# Patient Record
Sex: Male | Born: 1950 | Race: Black or African American | Hispanic: No | Marital: Married | Smoking: Never smoker
Health system: Southern US, Community
[De-identification: ages and names within clinical notes are randomized; demographics above are authoritative.]

## PROBLEM LIST (undated history)

## (undated) DIAGNOSIS — E119 Type 2 diabetes mellitus without complications: Secondary | ICD-10-CM

## (undated) DIAGNOSIS — I639 Cerebral infarction, unspecified: Secondary | ICD-10-CM

## (undated) DIAGNOSIS — R1310 Dysphagia, unspecified: Secondary | ICD-10-CM

## (undated) DIAGNOSIS — R0902 Hypoxemia: Secondary | ICD-10-CM

## (undated) DIAGNOSIS — I63219 Cerebral infarction due to unspecified occlusion or stenosis of unspecified vertebral arteries: Secondary | ICD-10-CM

## (undated) DIAGNOSIS — I10 Essential (primary) hypertension: Secondary | ICD-10-CM

## (undated) HISTORY — DX: Cerebral infarction due to unspecified occlusion or stenosis of unspecified vertebral artery: I63.219

## (undated) HISTORY — DX: Dysphagia, unspecified: R13.10

## (undated) HISTORY — DX: Hypoxemia: R09.02

---

## 1999-06-30 ENCOUNTER — Emergency Department (HOSPITAL_COMMUNITY): Admission: EM | Admit: 1999-06-30 | Discharge: 1999-06-30 | Payer: Self-pay | Admitting: *Deleted

## 2008-11-06 ENCOUNTER — Encounter: Admission: RE | Admit: 2008-11-06 | Discharge: 2008-11-06 | Payer: Self-pay | Admitting: Specialist

## 2009-07-15 ENCOUNTER — Emergency Department (HOSPITAL_COMMUNITY): Admission: EM | Admit: 2009-07-15 | Discharge: 2009-07-15 | Payer: Self-pay | Admitting: Family Medicine

## 2009-11-17 ENCOUNTER — Encounter: Admission: RE | Admit: 2009-11-17 | Discharge: 2009-12-09 | Payer: Self-pay | Admitting: Orthopaedic Surgery

## 2010-07-01 ENCOUNTER — Encounter: Admission: RE | Admit: 2010-07-01 | Discharge: 2010-07-01 | Payer: Self-pay | Admitting: Specialist

## 2013-09-05 ENCOUNTER — Encounter (HOSPITAL_COMMUNITY): Payer: Self-pay | Admitting: Emergency Medicine

## 2013-09-05 ENCOUNTER — Emergency Department (INDEPENDENT_AMBULATORY_CARE_PROVIDER_SITE_OTHER)
Admission: EM | Admit: 2013-09-05 | Discharge: 2013-09-05 | Disposition: A | Discharge status: Home / Self Care | Payer: Self-pay | Source: Home / Self Care | Attending: Family Medicine | Admitting: Family Medicine

## 2013-09-05 DIAGNOSIS — R42 Dizziness and giddiness: Secondary | ICD-10-CM

## 2013-09-05 LAB — GLUCOSE, CAPILLARY: Glucose-Capillary: 195 mg/dL — ABNORMAL HIGH (ref 70–99)

## 2013-09-05 NOTE — ED Provider Notes (Signed)
CSN: [id number]     Arrival date & time 09/05/13  1218 History   First MD Initiated Contact with Patient 09/05/13 1409     Chief Complaint  Patient presents with  . Dizziness   (Consider location/radiation/quality/duration/timing/severity/associated sxs/prior Treatment) HPI Comments: 63 year old male presents complaining of feeling like he does not have normal coordination for the past 3 days. He describes this as a strange sensation of dizziness. This occurs only when he is walking, never with standing, sitting, or changes in position. He feels like he cannot walk straight and he is bumping into either wall. He denies any and all other symptoms. He does report a history of stroke many years ago with no residual deficits.  Patient is a 63 y.o. male presenting with dizziness.  Dizziness Associated symptoms: no chest pain, no diarrhea, no headaches, no nausea, no palpitations, no shortness of breath and no vomiting     History reviewed. No pertinent past medical history. History reviewed. No pertinent past surgical history. History reviewed. No pertinent family history. History  Substance Use Topics  . Smoking status: Never Smoker   . Smokeless tobacco: Not on file  . Alcohol Use: No    Review of Systems  Constitutional: Negative for fever, chills, diaphoresis, activity change, appetite change, fatigue and unexpected weight change.  HENT: Negative for congestion, ear pain, postnasal drip, rhinorrhea, sinus pressure, sore throat and voice change.   Eyes: Negative for visual disturbance.  Respiratory: Negative for cough, chest tightness and shortness of breath.   Cardiovascular: Negative for chest pain, palpitations and leg swelling.  Gastrointestinal: Negative for nausea, vomiting, abdominal pain, diarrhea and constipation.  Genitourinary: Negative for dysuria, urgency, frequency and hematuria.  Musculoskeletal: Negative for arthralgias, myalgias, neck pain and neck stiffness.  Skin:  Negative for rash.  Neurological: Positive for dizziness ( Lack of coordination ). Negative for tremors, seizures, syncope, facial asymmetry, speech difficulty, weakness, light-headedness, numbness and headaches.    Allergies  Review of patient's allergies indicates no known allergies.  Home Medications  No current outpatient prescriptions on file. BP 146/91  Pulse 69  Temp(Src) 98 F (36.7 C) (Oral)  Resp 16  SpO2 100% Physical Exam  Nursing note and vitals reviewed. Constitutional: He is oriented to person, place, and time. Vital signs are normal. He appears well-developed and well-nourished. No distress.  HENT:  Head: Normocephalic.  Pulmonary/Chest: Effort normal. No respiratory distress.  Neurological: He is alert and oriented to person, place, and time. He has normal strength and normal reflexes. He displays no tremor. No cranial nerve deficit or sensory deficit. He exhibits normal muscle tone. He displays a negative Romberg sign. Coordination and gait normal.  Skin: Skin is warm and dry. No rash noted. He is not diaphoretic.  Psychiatric: He has a normal mood and affect. Judgment normal.    ED Course  Procedures (including critical care time) Labs Review Labs Reviewed  GLUCOSE, CAPILLARY - Abnormal; Notable for the following:    Glucose-Capillary 195 (*)    All other components within normal limits   Imaging Review No results found.  EKG Interpretation    Date/Time:    Ventricular Rate:    PR Interval:    QRS Duration:   QT Interval:    QTC Calculation:   R Axis:     Text Interpretation:              MDM   1. Dizziness    Case discussed with attending Dr. Juventino Slovak.  This patient's physical  exam is entirely normal with normal vital signs. He has no additional complaints to suggest an underlying problem. Although he states that he cannot walk straight, he is observed walking completely normally and he reports that his symptoms have resolved. He'll  followup with primary care. We'll start a baby aspirin once daily.       Liam Graham, PA-C 09/05/13 1502

## 2013-09-05 NOTE — Discharge Instructions (Signed)
Start taking one 81 mg baby aspirin daily. You need to set up a primary care physician to followup on this problem if it returns.  You also need routine health maintenance to minimize risk factors for another stroke or heart disease. Please call the [facility] as soon as possible to set up a followup appointment.   Dizziness Dizziness is a common problem. It is a feeling of unsteadiness or lightheadedness. You may feel like you are about to faint. Dizziness can lead to injury if you stumble or fall. A person of any age group can suffer from dizziness, but dizziness is more common in older adults. CAUSES  Dizziness can be caused by many different things, including:  Middle ear problems.  Standing for too long.  Infections.  An allergic reaction.  Aging.  An emotional response to something, such as the sight of blood.  Side effects of medicines.  Fatigue.  Problems with circulation or blood pressure.  Excess use of alcohol, medicines, or illegal drug use.  Breathing too fast (hyperventilation).  An arrhythmia or problems with your heart rhythm.  Low red blood cell count (anemia).  Pregnancy.  Vomiting, diarrhea, fever, or other illnesses that cause dehydration.  Diseases or conditions such as Parkinson's disease, high blood pressure (hypertension), diabetes, and thyroid problems.  Exposure to extreme heat. DIAGNOSIS  To find the cause of your dizziness, your caregiver may do a physical exam, lab tests, radiologic imaging scans, or an electrocardiography test (ECG).  TREATMENT  Treatment of dizziness depends on the cause of your symptoms and can vary greatly. HOME CARE INSTRUCTIONS   Drink enough fluids to keep your urine clear or pale yellow. This is especially important in very hot weather. In the elderly, it is also important in cold weather.  If your dizziness is caused by medicines, take them exactly as directed. When taking blood pressure  medicines, it is especially important to get up slowly.  Rise slowly from chairs and steady yourself until you feel okay.  In the morning, first sit up on the side of the bed. When this seems okay, stand slowly while holding onto something until you know your balance is fine.  If you need to stand in one place for a long time, be sure to move your legs often. Tighten and relax the muscles in your legs while standing.  If dizziness continues to be a problem, have someone stay with you for a day or two. Do this until you feel you are well enough to stay alone. Have the person call your caregiver if he or she notices changes in you that are concerning.  Do not drive or use heavy machinery if you feel dizzy.  Do not drink alcohol. SEEK IMMEDIATE MEDICAL CARE IF:   Your dizziness or lightheadedness gets worse.  You feel nauseous or vomit.  You develop problems with talking, walking, weakness, or using your arms, hands, or legs.  You are not thinking clearly or you have difficulty forming sentences. It may take a friend or family member to determine if your thinking is normal.  You develop chest pain, abdominal pain, shortness of breath, or sweating.  Your vision changes.  You notice any bleeding.  You have side effects from medicine that seems to be getting worse rather than better. MAKE SURE YOU:   Understand these instructions.  Will watch your condition.  Will get help right away if you are not doing well or get worse. Document Released: 02/14/2001  Document Revised: 11/13/2011 Document Reviewed: 03/10/2011 [facility] Patient Information 2014 [place].

## 2013-09-05 NOTE — ED Notes (Signed)
Reports feeling off balance with walking for the past 3 days.  Pt denies any other symptoms.  States appetite has been normal, no new meds, etc.  Pt states that he was told that he was borderline DM and hypertensive.   Currently taking no prescription meds.

## 2013-09-05 NOTE — ED Provider Notes (Signed)
Medical screening examination/treatment/procedure(s) were performed by resident physician or non-physician practitioner and as supervising physician I was immediately available for consultation/collaboration.   Pauline Good MD.   Billy Fischer, MD 09/05/13 [phone]

## 2013-09-08 ENCOUNTER — Ambulatory Visit: Payer: Self-pay | Attending: Internal Medicine | Admitting: Internal Medicine

## 2013-09-08 VITALS — BP 131/86 | HR 77 | Temp 98.9°F | Resp 14 | Ht 71.0 in | Wt 206.4 lb

## 2013-09-08 DIAGNOSIS — R42 Dizziness and giddiness: Secondary | ICD-10-CM | POA: Insufficient documentation

## 2013-09-08 LAB — CBC WITH DIFFERENTIAL/PLATELET
Basophils Absolute: 0 10*3/uL (ref 0.0–0.1)
Basophils Relative: 0 % (ref 0–1)
EOS PCT: 1 % (ref 0–5)
Eosinophils Absolute: 0.1 10*3/uL (ref 0.0–0.7)
HEMATOCRIT: 42.2 % (ref 39.0–52.0)
HEMOGLOBIN: 14.2 g/dL (ref 13.0–17.0)
LYMPHS ABS: 2 10*3/uL (ref 0.7–4.0)
LYMPHS PCT: 45 % (ref 12–46)
MCH: 30.3 pg (ref 26.0–34.0)
MCHC: 33.6 g/dL (ref 30.0–36.0)
MCV: 90.2 fL (ref 78.0–100.0)
MONO ABS: 0.4 10*3/uL (ref 0.1–1.0)
MONOS PCT: 9 % (ref 3–12)
Neutro Abs: 2 10*3/uL (ref 1.7–7.7)
Neutrophils Relative %: 45 % (ref 43–77)
PLATELETS: 206 10*3/uL (ref 150–400)
RBC: 4.68 MIL/uL (ref 4.22–5.81)
RDW: 12.9 % (ref 11.5–15.5)
WBC: 4.6 10*3/uL (ref 4.0–10.5)

## 2013-09-08 LAB — POCT GLYCOSYLATED HEMOGLOBIN (HGB A1C): HEMOGLOBIN A1C: 8.3

## 2013-09-08 MED ORDER — MECLIZINE HCL 25 MG PO TABS: 25.0000 mg | ORAL_TABLET | Freq: Three times a day (TID) | ORAL | Status: DC | PRN

## 2013-09-08 MED ORDER — ASPIRIN EC 81 MG PO TBEC: 81.0000 mg | DELAYED_RELEASE_TABLET | Freq: Every day | ORAL | Status: DC

## 2013-09-08 NOTE — Progress Notes (Signed)
Pt is here to establish care. Requests an A1c to know if he's a diabetic. Complains of unbalance while walking, no pain, staggered walking, unbalanced feet. Also complains of tightness and pain in Lt chest; sometimes tightness in Lt hand that comes and goes. Has stroke history.

## 2013-09-08 NOTE — Progress Notes (Signed)
Patient ID: Joe Perry, male   DOB: 1951-08-06, 63 y.o.   MRN: [id number]   CC:  HPI: 63 year old male here to establish care. He states that he had a mini stroke in 2006. He was admitted in the hospital in [place] for this. He also woke up with symptoms of vertigo on 09/05/13, went to the ED. He had no neurologic deficits and was discharged from the ER and aspirin No imaging studies were done  He also complains of occasional chest tightness which can be precipitated by rest as well as activity. He has no known history of heart disease. No history of blood pressure high cholesterol or nicotine dependence.  No family history according to the patient  Social history nonsmoker nonalcoholic, works as a [employer]   No Known Allergies History reviewed. No pertinent past medical history. No current outpatient prescriptions on file prior to visit.   No current facility-administered medications on file prior to visit.   History reviewed. No pertinent family history. History   Social History  . Marital Status: Married    Spouse Name: N/A    Number of Children: N/A  . Years of Education: N/A   Occupational History  . Not on file.   Social History Main Topics  . Smoking status: Never Smoker   . Smokeless tobacco: Not on file  . Alcohol Use: No  . Drug Use: No  . Sexual Activity: No   Other Topics Concern  . Not on file   Social History Narrative  . No narrative on file    Review of Systems  Constitutional: Negative for fever, chills, diaphoresis, activity change, appetite change and fatigue.  HENT: Negative for ear pain, nosebleeds, congestion, facial swelling, rhinorrhea, neck pain, neck stiffness and ear discharge.   Eyes: Negative for pain, discharge, redness, itching and visual disturbance.  Respiratory: Negative for cough, choking, chest tightness, shortness of breath, wheezing and stridor.   Cardiovascular: Negative for chest pain, palpitations and leg  swelling.  Gastrointestinal: Negative for abdominal distention.  Genitourinary: Negative for dysuria, urgency, frequency, hematuria, flank pain, decreased urine volume, difficulty urinating and dyspareunia.  Musculoskeletal: Negative for back pain, joint swelling, arthralgias and gait problem.  Neurological: Negative for dizziness, tremors, seizures, syncope, facial asymmetry, speech difficulty, weakness, light-headedness, numbness and headaches.  Hematological: Negative for adenopathy. Does not bruise/bleed easily.  Psychiatric/Behavioral: Negative for hallucinations, behavioral problems, confusion, dysphoric mood, decreased concentration and agitation.    Objective:   Filed Vitals:   09/08/13 1727  BP: 131/86  Pulse: 77  Temp: 98.9 F (37.2 C)  Resp: 14    Physical Exam  Constitutional: Appears well-developed and well-nourished. No distress.  HENT: Normocephalic. External right and left ear normal. Oropharynx is clear and moist.  Eyes: Conjunctivae and EOM are normal. PERRLA, no scleral icterus.  Neck: Normal ROM. Neck supple. No JVD. No tracheal deviation. No thyromegaly.  CVS: RRR, S1/S2 +, no murmurs, no gallops, no carotid bruit.  Pulmonary: Effort and breath sounds normal, no stridor, rhonchi, wheezes, rales.  Abdominal: Soft. BS +,  no distension, tenderness, rebound or guarding.  Musculoskeletal: Normal range of motion. No edema and no tenderness.  Lymphadenopathy: No lymphadenopathy noted, cervical, inguinal. Neuro: Alert. Normal reflexes, muscle tone coordination. No cranial nerve deficit. Skin: Skin is warm and dry. No rash noted. Not diaphoretic. No erythema. No pallor.  Psychiatric: Normal mood and affect. Behavior, judgment, thought content normal.   No results found for this basename: WBC, HGB, HCT, MCV, PLT  No results found for this basename: CREATININE, BUN, NA, K, CL, CO2    No results found for this basename: HGBA1C   Lipid Panel  No results found for  this basename: chol, trig, hdl, cholhdl, vldl, ldlcalc       Assessment and plan:   There are no active problems to display for this patient.  Vertigo We'll order MRI of the brain because of history of TIA in the past Patient to continue with baby aspirin MRI of the C-spine because of suspected bony fragments on x-ray done in 11/10 Meclizine 25 every 8 as needed Follow up in 3 days  Establish care Baseline labs Colonoscopy due in 2017 Flu vaccine today          The patient was given clear instructions to go to ER or return to medical center if symptoms don't improve, worsen or new problems develop. The patient verbalized understanding. The patient was told to call to get any lab results if not heard anything in the next week.

## 2013-09-09 ENCOUNTER — Telehealth: Payer: Self-pay | Admitting: *Deleted

## 2013-09-09 LAB — COMPLETE METABOLIC PANEL WITH GFR
ALT: 16 U/L (ref 0–53)
AST: 14 U/L (ref 0–37)
Albumin: 4.1 g/dL (ref 3.5–5.2)
Alkaline Phosphatase: 46 U/L (ref 39–117)
BUN: 14 mg/dL (ref 6–23)
CALCIUM: 9.3 mg/dL (ref 8.4–10.5)
CHLORIDE: 101 meq/L (ref 96–112)
CO2: 27 meq/L (ref 19–32)
CREATININE: 0.96 mg/dL (ref 0.50–1.35)
GFR, EST NON AFRICAN AMERICAN: 86 mL/min
GLUCOSE: 253 mg/dL — AB (ref 70–99)
Potassium: 4.3 mEq/L (ref 3.5–5.3)
Sodium: 137 mEq/L (ref 135–145)
TOTAL PROTEIN: 6.5 g/dL (ref 6.0–8.3)
Total Bilirubin: 0.5 mg/dL (ref 0.3–1.2)

## 2013-09-09 LAB — LIPID PANEL
CHOL/HDL RATIO: 4.7 ratio
Cholesterol: 208 mg/dL — ABNORMAL HIGH (ref 0–200)
HDL: 44 mg/dL (ref >39)
LDL Cholesterol: 113 mg/dL — ABNORMAL HIGH (ref 0–99)
Triglycerides: 254 mg/dL — ABNORMAL HIGH (ref <150)
VLDL: 51 mg/dL — AB (ref 0–40)

## 2013-09-09 LAB — TSH: TSH: 0.647 u[IU]/mL (ref 0.350–4.500)

## 2013-09-09 LAB — TROPONIN I: TROPONIN I: 0.03 ng/mL (ref <0.06)

## 2013-09-09 LAB — VITAMIN D 25 HYDROXY (VIT D DEFICIENCY, FRACTURES): VIT D 25 HYDROXY: 15 ng/mL — AB (ref 30–89)

## 2013-09-09 NOTE — Telephone Encounter (Signed)
Contacted pt to inform him of his scheduling for MRI na d Echocardiogram. Left a voicemail for pt to give [place] a call back.

## 2013-09-10 ENCOUNTER — Other Ambulatory Visit: Payer: Self-pay | Admitting: Internal Medicine

## 2013-09-10 MED ORDER — METFORMIN HCL 1000 MG PO TABS: 1000.0000 mg | ORAL_TABLET | Freq: Two times a day (BID) | ORAL | Status: DC

## 2013-09-10 MED ORDER — ERGOCALCIFEROL 1.25 MG (50000 UT) PO CAPS: 50000.0000 [IU] | ORAL_CAPSULE | ORAL | Status: DC

## 2013-09-11 ENCOUNTER — Telehealth: Payer: Self-pay | Admitting: *Deleted

## 2013-09-11 NOTE — Telephone Encounter (Signed)
Line was busy when tried to contact pt about his lab results.

## 2013-09-11 NOTE — Telephone Encounter (Signed)
Message copied by Aking Klabunde, [place] R on Thu Sep 11, 2013  5:36 PM ------      Message from: Allyson Sabal MD, [facility]      Created: Wed Sep 10, 2013  3:30 PM       Please notify patient when the patient is a diabetic, hemoglobin A1c of 8.3, have called in a prescription for metformin, [facility]      Vitamin D deficient, have called in a prescription for vitamin D to the clinic ------

## 2013-09-11 NOTE — Telephone Encounter (Signed)
Message copied by Schon Zeiders, [place] R on Thu Sep 11, 2013  2:36 PM ------      Message from: Allyson Sabal MD, [facility]      Created: Wed Sep 10, 2013  3:30 PM       Please notify patient when the patient is a diabetic, hemoglobin A1c of 8.3, have called in a prescription for metformin, in the [facility]      Vitamin D deficient, have called in a prescription for vitamin D to the clinic ------

## 2013-09-12 ENCOUNTER — Ambulatory Visit (HOSPITAL_COMMUNITY)
Admission: RE | Admit: 2013-09-12 | Discharge: 2013-09-12 | Disposition: A | Discharge status: Home / Self Care | Payer: Self-pay | Source: Ambulatory Visit | Attending: Internal Medicine | Admitting: Internal Medicine

## 2013-09-12 DIAGNOSIS — I517 Cardiomegaly: Secondary | ICD-10-CM

## 2013-09-12 DIAGNOSIS — R42 Dizziness and giddiness: Secondary | ICD-10-CM | POA: Insufficient documentation

## 2013-09-12 NOTE — Progress Notes (Signed)
  Echocardiogram 2D Echocardiogram has been performed.  Joe Perry Joe Perry 09/12/2013, 3:30 PM

## 2013-09-22 MED ORDER — GEMFIBROZIL 600 MG PO TABS: 600.0000 mg | ORAL_TABLET | Freq: Two times a day (BID) | ORAL | Status: DC

## 2013-09-23 ENCOUNTER — Ambulatory Visit (HOSPITAL_COMMUNITY): Admission: RE | Admit: 2013-09-23 | Payer: Self-pay | Source: Ambulatory Visit

## 2013-09-23 ENCOUNTER — Telehealth: Payer: Self-pay | Admitting: *Deleted

## 2013-09-23 ENCOUNTER — Ambulatory Visit (HOSPITAL_COMMUNITY): Payer: Self-pay | Attending: Internal Medicine

## 2013-09-23 NOTE — Telephone Encounter (Signed)
Message copied by Andrea Ferrer, [place] R on Tue Sep 23, 2013 12:59 PM ------      Message from: Allyson Sabal MD, [facility]      Created: Mon Sep 22, 2013  2:58 PM       Notify patient of the patient's triglycerides and LDL elevated called in a prescription for lopid to [facility]. The vitamin D level is also marginally low. The patient should start taking vitamin D 2000 international units over-the-counter twice a day ------

## 2013-09-23 NOTE — Telephone Encounter (Signed)
Left a voicemail for pt to give us a call back. 

## 2013-09-23 NOTE — Telephone Encounter (Signed)
Message copied by Ceana Fiala, [place] R on Tue Sep 23, 2013  1:08 PM ------      Message from: Allyson Sabal MD, [facility]      Created: Mon Sep 22, 2013 10:55 AM       Notify patient of the 2-D echo is negative.       ------

## 2016-01-30 ENCOUNTER — Encounter (HOSPITAL_COMMUNITY): Payer: Self-pay

## 2016-01-30 ENCOUNTER — Emergency Department (HOSPITAL_COMMUNITY): Payer: Self-pay

## 2016-01-30 DIAGNOSIS — R42 Dizziness and giddiness: Principal | ICD-10-CM | POA: Insufficient documentation

## 2016-01-30 DIAGNOSIS — E119 Type 2 diabetes mellitus without complications: Secondary | ICD-10-CM | POA: Insufficient documentation

## 2016-01-30 DIAGNOSIS — R51 Headache: Secondary | ICD-10-CM | POA: Insufficient documentation

## 2016-01-30 DIAGNOSIS — R29810 Facial weakness: Secondary | ICD-10-CM | POA: Insufficient documentation

## 2016-01-30 DIAGNOSIS — I10 Essential (primary) hypertension: Secondary | ICD-10-CM | POA: Insufficient documentation

## 2016-01-30 DIAGNOSIS — Z7982 Long term (current) use of aspirin: Secondary | ICD-10-CM | POA: Insufficient documentation

## 2016-01-30 DIAGNOSIS — I7389 Other specified peripheral vascular diseases: Secondary | ICD-10-CM | POA: Insufficient documentation

## 2016-01-30 DIAGNOSIS — Z794 Long term (current) use of insulin: Secondary | ICD-10-CM | POA: Insufficient documentation

## 2016-01-30 DIAGNOSIS — R2689 Other abnormalities of gait and mobility: Secondary | ICD-10-CM | POA: Insufficient documentation

## 2016-01-30 DIAGNOSIS — Z7984 Long term (current) use of oral hypoglycemic drugs: Secondary | ICD-10-CM | POA: Insufficient documentation

## 2016-01-30 DIAGNOSIS — R252 Cramp and spasm: Secondary | ICD-10-CM | POA: Insufficient documentation

## 2016-01-30 DIAGNOSIS — Z7902 Long term (current) use of antithrombotics/antiplatelets: Secondary | ICD-10-CM | POA: Insufficient documentation

## 2016-01-30 DIAGNOSIS — I69398 Other sequelae of cerebral infarction: Secondary | ICD-10-CM | POA: Insufficient documentation

## 2016-01-30 LAB — I-STAT CHEM 8, ED
BUN: 23 mg/dL — ABNORMAL HIGH (ref 6–20)
CALCIUM ION: 1.15 mmol/L (ref 1.13–1.30)
CHLORIDE: 99 mmol/L — AB (ref 101–111)
Creatinine, Ser: 0.9 mg/dL (ref 0.61–1.24)
Glucose, Bld: 236 mg/dL — ABNORMAL HIGH (ref 65–99)
HCT: 47 % (ref 39.0–52.0)
Hemoglobin: 16 g/dL (ref 13.0–17.0)
Potassium: 3.7 mmol/L (ref 3.5–5.1)
SODIUM: 143 mmol/L (ref 135–145)
TCO2: 30 mmol/L (ref 0–100)

## 2016-01-30 LAB — CBG MONITORING, ED: Glucose-Capillary: 214 mg/dL — ABNORMAL HIGH (ref 65–99)

## 2016-01-30 LAB — DIFFERENTIAL
BASOS PCT: 0 %
Basophils Absolute: 0 10*3/uL (ref 0.0–0.1)
EOS PCT: 0 %
Eosinophils Absolute: 0 10*3/uL (ref 0.0–0.7)
LYMPHS PCT: 17 %
Lymphs Abs: 1.5 10*3/uL (ref 0.7–4.0)
MONO ABS: 0.8 10*3/uL (ref 0.1–1.0)
MONOS PCT: 9 %
Neutro Abs: 6.6 10*3/uL (ref 1.7–7.7)
Neutrophils Relative %: 74 %

## 2016-01-30 LAB — CBC
HEMATOCRIT: 44.8 % (ref 39.0–52.0)
Hemoglobin: 14.6 g/dL (ref 13.0–17.0)
MCH: 30.2 pg (ref 26.0–34.0)
MCHC: 32.6 g/dL (ref 30.0–36.0)
MCV: 92.6 fL (ref 78.0–100.0)
Platelets: 263 10*3/uL (ref 150–400)
RBC: 4.84 MIL/uL (ref 4.22–5.81)
RDW: 13.2 % (ref 11.5–15.5)
WBC: 9 10*3/uL (ref 4.0–10.5)

## 2016-01-30 LAB — COMPREHENSIVE METABOLIC PANEL
ALK PHOS: 57 U/L (ref 38–126)
ALT: 26 U/L (ref 17–63)
ANION GAP: 14 (ref 5–15)
AST: 17 U/L (ref 15–41)
Albumin: 4.4 g/dL (ref 3.5–5.0)
BUN: 21 mg/dL — ABNORMAL HIGH (ref 6–20)
CALCIUM: 10.1 mg/dL (ref 8.9–10.3)
CO2: 26 mmol/L (ref 22–32)
Chloride: 100 mmol/L — ABNORMAL LOW (ref 101–111)
Creatinine, Ser: 1 mg/dL (ref 0.61–1.24)
Glucose, Bld: 233 mg/dL — ABNORMAL HIGH (ref 65–99)
Potassium: 3.7 mmol/L (ref 3.5–5.1)
SODIUM: 140 mmol/L (ref 135–145)
Total Bilirubin: 1.6 mg/dL — ABNORMAL HIGH (ref 0.3–1.2)
Total Protein: 7.9 g/dL (ref 6.5–8.1)

## 2016-01-30 LAB — PROTIME-INR
INR: 1.09 (ref 0.00–1.49)
PROTHROMBIN TIME: 14.3 s (ref 11.6–15.2)

## 2016-01-30 LAB — I-STAT TROPONIN, ED: TROPONIN I, POC: 0.02 ng/mL (ref 0.00–0.08)

## 2016-01-30 LAB — APTT: aPTT: 25 seconds (ref 24–37)

## 2016-01-30 NOTE — ED Notes (Signed)
Pt reports he got up to go to BR this morning and felt dizzy and gait disturbances, has to have assistance when walking.  No other s/s noted.  No facial droop, arm drift, hand grips equal.

## 2016-01-31 ENCOUNTER — Emergency Department (HOSPITAL_COMMUNITY): Payer: Self-pay

## 2016-01-31 ENCOUNTER — Encounter (HOSPITAL_COMMUNITY): Payer: Self-pay | Admitting: Emergency Medicine

## 2016-01-31 ENCOUNTER — Observation Stay (HOSPITAL_COMMUNITY)
Admission: EM | Admit: 2016-01-31 | Discharge: 2016-02-02 | Disposition: A | Discharge status: Home / Self Care | Payer: Self-pay | Attending: Internal Medicine | Admitting: Internal Medicine

## 2016-01-31 DIAGNOSIS — E119 Type 2 diabetes mellitus without complications: Secondary | ICD-10-CM

## 2016-01-31 DIAGNOSIS — I63219 Cerebral infarction due to unspecified occlusion or stenosis of unspecified vertebral arteries: Secondary | ICD-10-CM | POA: Insufficient documentation

## 2016-01-31 DIAGNOSIS — I6990 Unspecified sequelae of unspecified cerebrovascular disease: Secondary | ICD-10-CM

## 2016-01-31 DIAGNOSIS — R270 Ataxia, unspecified: Secondary | ICD-10-CM

## 2016-01-31 DIAGNOSIS — R252 Cramp and spasm: Secondary | ICD-10-CM | POA: Insufficient documentation

## 2016-01-31 DIAGNOSIS — R269 Unspecified abnormalities of gait and mobility: Secondary | ICD-10-CM | POA: Insufficient documentation

## 2016-01-31 DIAGNOSIS — I10 Essential (primary) hypertension: Secondary | ICD-10-CM | POA: Diagnosis present

## 2016-01-31 DIAGNOSIS — I69398 Other sequelae of cerebral infarction: Secondary | ICD-10-CM | POA: Insufficient documentation

## 2016-01-31 DIAGNOSIS — Z8673 Personal history of transient ischemic attack (TIA), and cerebral infarction without residual deficits: Secondary | ICD-10-CM

## 2016-01-31 DIAGNOSIS — I639 Cerebral infarction, unspecified: Secondary | ICD-10-CM | POA: Diagnosis present

## 2016-01-31 HISTORY — DX: Essential (primary) hypertension: I10

## 2016-01-31 HISTORY — DX: Cerebral infarction, unspecified: I63.9

## 2016-01-31 HISTORY — DX: Type 2 diabetes mellitus without complications: E11.9

## 2016-01-31 LAB — CBC
HEMATOCRIT: 45.4 % (ref 39.0–52.0)
HEMOGLOBIN: 14.7 g/dL (ref 13.0–17.0)
MCH: 30.7 pg (ref 26.0–34.0)
MCHC: 32.4 g/dL (ref 30.0–36.0)
MCV: 94.8 fL (ref 78.0–100.0)
Platelets: 241 10*3/uL (ref 150–400)
RBC: 4.79 MIL/uL (ref 4.22–5.81)
RDW: 13.3 % (ref 11.5–15.5)
WBC: 10.8 10*3/uL — AB (ref 4.0–10.5)

## 2016-01-31 LAB — GLUCOSE, CAPILLARY
GLUCOSE-CAPILLARY: 142 mg/dL — AB (ref 65–99)
Glucose-Capillary: 109 mg/dL — ABNORMAL HIGH (ref 65–99)
Glucose-Capillary: 187 mg/dL — ABNORMAL HIGH (ref 65–99)
Glucose-Capillary: 128 mg/dL — ABNORMAL HIGH (ref 65–99)

## 2016-01-31 LAB — I-STAT TROPONIN, ED: TROPONIN I, POC: 0.03 ng/mL (ref 0.00–0.08)

## 2016-01-31 LAB — CREATININE, SERUM
Creatinine, Ser: 1.03 mg/dL (ref 0.61–1.24)
GFR calc Af Amer: >60 mL/min
GFR calc non Af Amer: >60 mL/min

## 2016-01-31 LAB — LIPID PANEL
CHOL/HDL RATIO: 2.5 ratio
Cholesterol: 118 mg/dL (ref 0–200)
HDL: 48 mg/dL (ref >40)
LDL CALC: 51 mg/dL (ref 0–99)
TRIGLYCERIDES: 96 mg/dL (ref <150)
VLDL: 19 mg/dL (ref 0–40)

## 2016-01-31 MED ORDER — STROKE: EARLY STAGES OF RECOVERY BOOK
Freq: Once | Status: AC
  Administered 2016-01-31: 08:00:00
  Filled 2016-01-31: qty 1

## 2016-01-31 MED ORDER — ACETAMINOPHEN 325 MG PO TABS: 650.0000 mg | ORAL_TABLET | ORAL | Status: DC | PRN

## 2016-01-31 MED ORDER — ONDANSETRON HCL 4 MG/2ML IJ SOLN
4.0000 mg | Freq: Once | INTRAMUSCULAR | Status: AC
  Administered 2016-01-31: 4 mg via INTRAVENOUS
  Filled 2016-01-31: qty 2

## 2016-01-31 MED ORDER — METFORMIN HCL 500 MG PO TABS
1000.0000 mg | ORAL_TABLET | Freq: Every day | ORAL | Status: DC
  Administered 2016-01-31 – 2016-02-02 (×3): 1000 mg via ORAL
  Filled 2016-01-31 (×3): qty 2

## 2016-01-31 MED ORDER — SENNOSIDES-DOCUSATE SODIUM 8.6-50 MG PO TABS: 1.0000 | ORAL_TABLET | Freq: Every evening | ORAL | Status: DC | PRN

## 2016-01-31 MED ORDER — ASPIRIN 325 MG PO TABS
325.0000 mg | ORAL_TABLET | Freq: Every day | ORAL | Status: DC
  Administered 2016-01-31 – 2016-02-02 (×3): 325 mg via ORAL
  Filled 2016-01-31 (×3): qty 1

## 2016-01-31 MED ORDER — INSULIN ASPART 100 UNIT/ML ~~LOC~~ SOLN: 0.0000 [IU] | Freq: Every day | SUBCUTANEOUS | Status: DC

## 2016-01-31 MED ORDER — ASPIRIN 300 MG RE SUPP: 300.0000 mg | Freq: Every day | RECTAL | Status: DC

## 2016-01-31 MED ORDER — ASPIRIN EC 81 MG PO TBEC: 81.0000 mg | DELAYED_RELEASE_TABLET | Freq: Every day | ORAL | Status: DC

## 2016-01-31 MED ORDER — SODIUM CHLORIDE 0.9 % IV BOLUS (SEPSIS)
500.0000 mL | Freq: Once | INTRAVENOUS | Status: AC
  Administered 2016-01-31: 500 mL via INTRAVENOUS

## 2016-01-31 MED ORDER — INSULIN ASPART 100 UNIT/ML ~~LOC~~ SOLN
0.0000 [IU] | Freq: Three times a day (TID) | SUBCUTANEOUS | Status: DC
  Administered 2016-01-31: 2 [IU] via SUBCUTANEOUS
  Administered 2016-01-31: 3 [IU] via SUBCUTANEOUS
  Administered 2016-02-01: 2 [IU] via SUBCUTANEOUS
  Administered 2016-02-02: 3 [IU] via SUBCUTANEOUS

## 2016-01-31 MED ORDER — BACLOFEN 10 MG PO TABS
5.0000 mg | ORAL_TABLET | Freq: Two times a day (BID) | ORAL | Status: DC
  Administered 2016-01-31 – 2016-02-02 (×5): 5 mg via ORAL
  Filled 2016-01-31 (×5): qty 1

## 2016-01-31 MED ORDER — ACETAMINOPHEN 650 MG RE SUPP: 650.0000 mg | RECTAL | Status: DC | PRN

## 2016-01-31 MED ORDER — ENOXAPARIN SODIUM 40 MG/0.4ML ~~LOC~~ SOLN
40.0000 mg | Freq: Every day | SUBCUTANEOUS | Status: DC
  Administered 2016-01-31 – 2016-02-02 (×3): 40 mg via SUBCUTANEOUS
  Filled 2016-01-31 (×3): qty 0.4

## 2016-01-31 MED ORDER — BACLOFEN 10 MG PO TABS: 10.0000 mg | ORAL_TABLET | Freq: Every day | ORAL | Status: DC

## 2016-01-31 MED ORDER — CLOPIDOGREL BISULFATE 75 MG PO TABS
75.0000 mg | ORAL_TABLET | Freq: Every day | ORAL | Status: DC
  Administered 2016-01-31 – 2016-02-02 (×3): 75 mg via ORAL
  Filled 2016-01-31 (×3): qty 1

## 2016-01-31 NOTE — Care Management Note (Signed)
Case Management Note  Patient Details  Name: DANIS PEMBLETON MRN: [id number] Date of Birth: [birth year]  Subjective/Objective:     Pt in to r/o CVA. He is from home alone and no PCP listed.                Action/Plan: CM met with the patient and asked him about the [facility]. Patient was interested. CM went over the use of the pharmacy to assist with his medications even at discharge this visit. Pt expressed understanding. The [facility] is closed today for the holiday. CM will call and arrange him an appointment tomorrow am.   Expected Discharge Date:                  Expected Discharge Plan:     In-House Referral:     Discharge planning Services     Post Acute Care Choice:    Choice offered to:     DME Arranged:    DME Agency:     HH Arranged:    [place] Agency:     Status of Service:  In process, will continue to follow  Medicare Important Message Given:    Date Medicare IM Given:    Medicare IM give by:    Date Additional Medicare IM Given:    Additional Medicare Important Message give by:     If discussed at [place] of Stay Meetings, dates discussed:    Additional Comments:  Pollie Friar, RN 01/31/2016, 2:51 PM

## 2016-01-31 NOTE — Progress Notes (Signed)
Patient seen and evaluated earlier this a.m. by my associate. Please refer to H&P for details regarding assessment and plan.  We'll plan on reassessing next a.m.  Contact the neurology said they can round on patient  Gen.: Patient in no acute distress Cardiovascular: No cyanosis Pulmonary: No increased work of breathing, equal chest rise  [place], [place]

## 2016-01-31 NOTE — ED Notes (Signed)
Patient transported to MRI 

## 2016-01-31 NOTE — ED Provider Notes (Signed)
CSN: [id number]     Arrival date & time 01/30/16  2231 History  By signing my name below, I, Joe Perry, attest that this documentation has been prepared under the direction and in the presence of Waniya Hoglund, MD.  Electronically Signed: Tedra Coupe. Sheppard Coil, ED Scribe. 01/31/2016. 2:52 AM.  Chief Complaint  Patient presents with  . Dizziness    Patient is a 65 y.o. male presenting with fall. The history is provided by the patient. No language interpreter was used.  Fall This is a new problem. The current episode started 12 to 24 hours ago. Episode frequency: 3. The problem has not changed since onset.Pertinent negatives include no chest pain, no abdominal pain, no headaches and no shortness of breath. Nothing aggravates the symptoms. Nothing relieves the symptoms. He has tried nothing for the symptoms. The treatment provided no relief.   HPI Comments: Joe Perry is a 65 y.o. male with PMHx of Vertigo, HTN, and DM who presents to the Emergency Department complaining of gradual onset dizziness x 1 day. He reports feeling wobbly upon walking. He notes that he has fallen 3x in one day, in his office, bathroom, and kitchen. He denies any LOC. Pt has associated 6-7 episodes of vomiting after drinking water and elevated CBG levels ranging from 150-170 mg/dL. He denies headache, abdominal pain, weakness, numbness, nausea, diarrhea, constipation, abdominal pain, neck/back pain, tinnitus, double vision, rashes, fever, polyuria, dysuria, or burning.  Past Medical History  Diagnosis Date  . Hypertension   . Diabetes mellitus without complication ([place])   . Stroke [facility])    History reviewed. No pertinent past surgical history. History reviewed. No pertinent family history. Social History  Substance Use Topics  . Smoking status: Never Smoker   . Smokeless tobacco: None  . Alcohol Use: No    Review of Systems  Constitutional: Negative for fever.  HENT: Negative for congestion, ear  pain and tinnitus.   Eyes: Negative for photophobia and visual disturbance.  Respiratory: Negative for shortness of breath.   Cardiovascular: Negative for chest pain, palpitations and leg swelling.  Gastrointestinal: Positive for vomiting. Negative for nausea, abdominal pain, diarrhea and constipation.  Endocrine: Negative for polyuria.  Genitourinary: Negative for dysuria.  Musculoskeletal: Negative for back pain and neck pain.  Skin: Negative for rash.  Neurological: Positive for dizziness ([place]). Negative for syncope, facial asymmetry, weakness, light-headedness, numbness and headaches.  All other systems reviewed and are negative.     Allergies  Review of patient's allergies indicates no known allergies.  Home Medications   Prior to Admission medications   Medication Sig Start Date End Date Taking? Authorizing Provider  aspirin EC 81 MG tablet Take 1 tablet (81 mg total) by mouth daily. 09/08/13   Reyne Dumas, MD  ergocalciferol (VITAMIN D2) 50000 UNITS capsule Take 1 capsule (50,000 Units total) by mouth once a week. 09/10/13   Reyne Dumas, MD  gemfibrozil (LOPID) 600 MG tablet Take 1 tablet (600 mg total) by mouth 2 (two) times daily before a meal. 09/22/13   Reyne Dumas, MD  meclizine (ANTIVERT) 25 MG tablet Take 1 tablet (25 mg total) by mouth 3 (three) times daily as needed for dizziness or nausea. 09/08/13   Reyne Dumas, MD  metFORMIN (GLUCOPHAGE) 1000 MG tablet Take 1 tablet (1,000 mg total) by mouth 2 (two) times daily with a meal. 09/10/13   Reyne Dumas, MD   BP 156/98 mmHg  Pulse 94  Temp(Src) 98.3 F (36.8 C) (Oral)  Resp 16  SpO2 96% Physical Exam  Constitutional: He is oriented to person, place, and time. He appears well-developed and well-nourished.  HENT:  Head: Normocephalic and atraumatic.  Right Ear: Tympanic membrane and external ear normal.  Left Ear: Tympanic membrane and external ear normal.  Eyes: EOM are normal. Pupils are equal, round, and reactive  to light.  Neck: Normal range of motion.  Cardiovascular: Normal rate, regular rhythm and normal heart sounds.   Pulmonary/Chest: Effort normal and breath sounds normal. No respiratory distress. He has no wheezes. He has no rales.  Abdominal: Soft. Bowel sounds are normal. He exhibits no distension. There is no tenderness. There is no rebound and no guarding.  Musculoskeletal: Normal range of motion.  Neurological: He is alert and oriented to person, place, and time. No cranial nerve deficit.  5/5 strength to lower bilateral extremities. No pronator drift.  Skin: Skin is warm and dry. No rash noted.  Psychiatric: He has a normal mood and affect. Judgment normal.  Nursing note and vitals reviewed.   ED Course  Procedures (including critical care time) DIAGNOSTIC STUDIES: Oxygen Saturation is 96% on RA, normal by my interpretation.    COORDINATION OF CARE: 2:57 AM-Discussed treatment plan which includes blood work, EKG, and imaging with pt at bedside and pt agreed to plan.    Labs Review Labs Reviewed  COMPREHENSIVE METABOLIC PANEL - Abnormal; Notable for the following:    Chloride 100 (*)    Glucose, Bld 233 (*)    BUN 21 (*)    Total Bilirubin 1.6 (*)    All other components within normal limits  CBG MONITORING, ED - Abnormal; Notable for the following:    Glucose-Capillary 214 (*)    All other components within normal limits  I-STAT CHEM 8, ED - Abnormal; Notable for the following:    Chloride 99 (*)    BUN 23 (*)    Glucose, Bld 236 (*)    All other components within normal limits  PROTIME-INR  APTT  CBC  DIFFERENTIAL  I-STAT TROPOININ, ED  CBG MONITORING, ED  I-STAT TROPOININ, ED    Imaging Review Ct Head Wo Contrast  01/30/2016  CLINICAL DATA:  Acute onset of dizziness and headache. Uncontrolled hypertension. Initial encounter. EXAM: CT HEAD WITHOUT CONTRAST TECHNIQUE: Contiguous axial images were obtained from the base of the skull through the vertex without  intravenous contrast. COMPARISON:  None. FINDINGS: There is no evidence of acute infarction, mass lesion, or intra- or extra-axial hemorrhage on CT. Prominence of the ventricles and sulci suggests mild cortical volume loss. Scattered periventricular white matter change likely reflects small vessel ischemic microangiopathy. Chronic lacunar infarcts are seen at the basal ganglia bilaterally. The brainstem and fourth ventricle are within normal limits. The cerebral hemispheres demonstrate grossly normal gray-white differentiation. No mass effect or midline shift is seen. There is no evidence of fracture; visualized osseous structures are unremarkable in appearance. The orbits are within normal limits. The paranasal sinuses and mastoid air cells are well-aerated. No significant soft tissue abnormalities are seen. IMPRESSION: 1. No acute intracranial pathology seen on CT. 2. Mild cortical volume loss and scattered small vessel ischemic microangiopathy. 3. Chronic lacunar infarcts at the basal ganglia bilaterally. Electronically Signed   By: Garald Balding M.D.   On: 01/30/2016 23:45   I have personally reviewed and evaluated these images and lab results as part of my medical decision-making.   EKG Interpretation   Date/Time:  Sunday Jan 30 2016 22:50:20 EDT Ventricular Rate:  95  PR Interval:  182 QRS Duration: 78 QT Interval:  360 QTC Calculation: 452 R Axis:   -12 Text Interpretation:  Normal sinus rhythm Moderate voltage criteria for  LVH, may be normal variant Nonspecific ST abnormality Confirmed by  [facility]  MD, Verlinda Slotnick ([id number]) on 01/31/2016 2:40:16 AM      MDM   Final diagnoses:  None   Filed Vitals:   01/30/16 2246  BP: 156/98  Pulse: 94  Temp: 98.3 F (36.8 C)  Resp: 16     EKG Interpretation  Date/Time:  Sunday Jan 30 2016 22:50:20 EDT Ventricular Rate:  95 PR Interval:  182 QRS Duration: 78 QT Interval:  360 QTC Calculation: 452 R Axis:   -12 Text Interpretation:   Normal sinus rhythm Moderate voltage criteria for LVH, may be normal variant Nonspecific ST abnormality Confirmed by [facility]  MD, Chirstina Haan ([id number]) on 01/31/2016 2:40:16 AM      Results for orders placed or performed during the hospital encounter of 01/31/16  Protime-INR  Result Value Ref Range   Prothrombin Time 14.3 11.6 - 15.2 seconds   INR 1.09 0.00 - 1.49  APTT  Result Value Ref Range   aPTT 25 24 - 37 seconds  CBC  Result Value Ref Range   WBC 9.0 4.0 - 10.5 K/uL   RBC 4.84 4.22 - 5.81 MIL/uL   Hemoglobin 14.6 13.0 - 17.0 g/dL   HCT 44.8 39.0 - 52.0 %   MCV 92.6 78.0 - 100.0 fL   MCH 30.2 26.0 - 34.0 pg   MCHC 32.6 30.0 - 36.0 g/dL   RDW 13.2 11.5 - 15.5 %   Platelets 263 150 - 400 K/uL  Differential  Result Value Ref Range   Neutrophils Relative % 74 %   Neutro Abs 6.6 1.7 - 7.7 K/uL   Lymphocytes Relative 17 %   Lymphs Abs 1.5 0.7 - 4.0 K/uL   Monocytes Relative 9 %   Monocytes Absolute 0.8 0.1 - 1.0 K/uL   Eosinophils Relative 0 %   Eosinophils Absolute 0.0 0.0 - 0.7 K/uL   Basophils Relative 0 %   Basophils Absolute 0.0 0.0 - 0.1 K/uL  Comprehensive metabolic panel  Result Value Ref Range   Sodium 140 135 - 145 mmol/L   Potassium 3.7 3.5 - 5.1 mmol/L   Chloride 100 (L) 101 - 111 mmol/L   CO2 26 22 - 32 mmol/L   Glucose, Bld 233 (H) 65 - 99 mg/dL   BUN 21 (H) 6 - 20 mg/dL   Creatinine, Ser 1.00 0.61 - 1.24 mg/dL   Calcium 10.1 8.9 - 10.3 mg/dL   Total Protein 7.9 6.5 - 8.1 g/dL   Albumin 4.4 3.5 - 5.0 g/dL   AST 17 15 - 41 U/L   ALT 26 17 - 63 U/L   Alkaline Phosphatase 57 38 - 126 U/L   Total Bilirubin 1.6 (H) 0.3 - 1.2 mg/dL   GFR calc non Af Amer >60 >60 mL/min   GFR calc Af Amer >60 >60 mL/min   Anion gap 14 5 - 15  CBG monitoring, ED  Result Value Ref Range   Glucose-Capillary 214 (H) 65 - 99 mg/dL   Comment 1 Notify RN   I-stat troponin, ED  Result Value Ref Range   Troponin i, poc 0.02 0.00 - 0.08 ng/mL   Comment 3          I-Stat Chem 8,  ED  Result Value Ref Range   Sodium 143 135 -  145 mmol/L   Potassium 3.7 3.5 - 5.1 mmol/L   Chloride 99 (L) 101 - 111 mmol/L   BUN 23 (H) 6 - 20 mg/dL   Creatinine, Ser 0.90 0.61 - 1.24 mg/dL   Glucose, Bld 236 (H) 65 - 99 mg/dL   Calcium, Ion 1.15 1.13 - 1.30 mmol/L   TCO2 30 0 - 100 mmol/L   Hemoglobin 16.0 13.0 - 17.0 g/dL   HCT 47.0 39.0 - 52.0 %  I-stat troponin, ED  Result Value Ref Range   Troponin i, poc 0.03 0.00 - 0.08 ng/mL   Comment 3           Dg Chest 2 View  01/31/2016  CLINICAL DATA:  Patient unsteady on feet, acute onset, with falls. Initial encounter. EXAM: CHEST  2 VIEW COMPARISON:  Chest radiograph performed 07/01/2010 FINDINGS: The lungs are well-aerated. Minimal bilateral atelectasis is noted. There is no evidence of pleural effusion or pneumothorax. The heart is normal in size; the mediastinal contour is within normal limits. No acute osseous abnormalities are seen. IMPRESSION: Minimal bilateral atelectasis noted. Lungs otherwise clear. No displaced rib fracture seen. Electronically Signed   By: Garald Balding M.D.   On: 01/31/2016 03:42   Ct Head Wo Contrast  01/30/2016  CLINICAL DATA:  Acute onset of dizziness and headache. Uncontrolled hypertension. Initial encounter. EXAM: CT HEAD WITHOUT CONTRAST TECHNIQUE: Contiguous axial images were obtained from the base of the skull through the vertex without intravenous contrast. COMPARISON:  None. FINDINGS: There is no evidence of acute infarction, mass lesion, or intra- or extra-axial hemorrhage on CT. Prominence of the ventricles and sulci suggests mild cortical volume loss. Scattered periventricular white matter change likely reflects small vessel ischemic microangiopathy. Chronic lacunar infarcts are seen at the basal ganglia bilaterally. The brainstem and fourth ventricle are within normal limits. The cerebral hemispheres demonstrate grossly normal gray-white differentiation. No mass effect or midline shift is seen. There  is no evidence of fracture; visualized osseous structures are unremarkable in appearance. The orbits are within normal limits. The paranasal sinuses and mastoid air cells are well-aerated. No significant soft tissue abnormalities are seen. IMPRESSION: 1. No acute intracranial pathology seen on CT. 2. Mild cortical volume loss and scattered small vessel ischemic microangiopathy. 3. Chronic lacunar infarcts at the basal ganglia bilaterally. Electronically Signed   By: Garald Balding M.D.   On: 01/30/2016 23:45    Will admit to obs for cva work up  I personally performed the services described in this documentation, which was scribed in my presence. The recorded information has been reviewed and is accurate.     Veatrice Kells, MD 01/31/16 [phone]

## 2016-01-31 NOTE — Evaluation (Signed)
Physical Therapy Evaluation Patient Details Name: Joe Perry MRN: [id number] DOB: 11/16/1950 Today's Date: 01/31/2016   History of Present Illness  Patient is a 65 y/o male with hx of HTN, DM and CVA presents with gait instability and dizziness. MRI- unremarkable. Admitted for acute TIA.   Clinical Impression  Patient presents with ataxia, incoordination LUE/LE, impaired balance and overall mobility s/p TIA. Pt from home alone and was independent PTA working as a Oceanographer. Pt now requires Mod A for gait training with use of RW for support due to ataxia. Pt with 2 LOB. Motivated to return to PLOF. Would benefit from CIR to maximize independence and mobility prior to return home.    Follow Up Recommendations CIR    Equipment Recommendations  Other (comment) (TBA)    Recommendations for Other Services OT consult     Precautions / Restrictions Precautions Precautions: Fall Restrictions Weight Bearing Restrictions: No      Mobility  Bed Mobility Overal bed mobility: Needs Assistance Bed Mobility: Supine to Sit     Supine to sit: Modified independent (Device/Increase time);HOB elevated     General bed mobility comments: Use of rail. No dizziness.   Transfers Overall transfer level: Needs assistance Equipment used: None Transfers: Sit to/from Stand Sit to Stand: Min assist         General transfer comment: Min A to steady in standing with pt reaching for walker to hold onto. Transferred to chair post ambulation bout.  Ambulation/Gait Ambulation/Gait assistance: Mod assist Ambulation Distance (Feet): 120 Feet Assistive device: Rolling walker (2 wheeled) Gait Pattern/deviations: Ataxic;Step-through pattern;Narrow base of support;Staggering right;Staggering left Gait velocity: decreased   General Gait Details: pt with ataxic gait pattern with right lateral lean and narrow BoS. Ataxia most notable through LLE. Staggering in all directions and does not do  well in small, cramped environments navigating RW. LOB x2.   Stairs            Wheelchair Mobility    Modified Rankin (Stroke Patients Only) Modified Rankin (Stroke Patients Only) Pre-Morbid Rankin Score: No significant disability Modified Rankin: Moderately severe disability     Balance Overall balance assessment: Needs assistance Sitting-balance support: Feet supported;No upper extremity supported Sitting balance-[name]: Good     Standing balance support: During functional activity Standing balance-[name]: Fair Standing balance comment: Requires at least 1 UE support in standing.                             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 Dysdiadochokinesia LUE)           Lower Extremity Assessment:  (Reports some of the incoordination deficits are from a prior CVA 10 years ago but the ataxia is new)   LLE Deficits / Details: Grossly ~4+/5 throughout.     Communication   Communication: No  difficulties  Cognition Arousal/Alertness: Awake/alert Behavior During Therapy: WFL for tasks assessed/performed Overall Cognitive Status: Within Functional Limits for tasks assessed                      General Comments General comments (skin integrity, edema, etc.): Discussed post acute rehab needs. Pt agreeable.     Exercises        Assessment/Plan    PT Assessment Patient needs continued PT services  PT Diagnosis Difficulty walking;Abnormality of gait   PT Problem List Decreased strength;Decreased coordination;Decreased balance;Decreased mobility;Decreased knowledge of use of DME  PT Treatment Interventions Balance training;Gait training;Functional mobility training;Therapeutic activities;Therapeutic exercise;Patient/family education;Stair training;Neuromuscular re-education;DME instruction   PT Goals (Current goals can be found in the Care Plan section) Acute Rehab PT Goals Patient Stated Goal: to return to normal PT Goal Formulation: With patient Time For Goal Achievement: 02/14/16 Potential to Achieve Goals: Good    Frequency Min 4X/week   Barriers to discharge Decreased caregiver support lives alone, 10 stairs to enter home    Co-evaluation               End of Session Equipment Utilized During Treatment: Gait belt Activity Tolerance: Patient tolerated treatment well Patient left: in chair;with call bell/phone within reach;with chair alarm set Nurse Communication: Mobility status;Other (comment)    Functional Assessment Tool Used: clinical judgment Functional Limitation: Mobility: Walking and moving around Mobility: Walking and Moving Around Current Status [phone]): At least 40 percent but less than 60 percent impaired, limited or restricted Mobility: Walking and Moving Around Goal Status [phone]): At least 20 percent but less than 40 percent impaired, limited or restricted    Time: [id number] PT Time Calculation (min) (ACUTE ONLY): 24 min   Charges:   PT Evaluation $PT Eval Moderate Complexity: 1 Procedure PT Treatments $Gait Training: 8-22 mins   PT G Codes:   PT G-Codes **NOT FOR INPATIENT CLASS** Functional Assessment Tool Used: clinical judgment Functional Limitation: Mobility: Walking and moving around Mobility: Walking and Moving Around Current Status [id number]):  At least 40 percent but less than 60 percent impaired, limited or restricted Mobility: Walking and Moving Around Goal Status [phone]): At least 20 percent but less than 40 percent impaired, limited or restricted    [place] 01/31/2016, 4:02 PM Wray Kearns, [place], DPT [phone]

## 2016-01-31 NOTE — ED Notes (Signed)
When patient needs a ride home, please call friend Joe Perry @ [phone]

## 2016-01-31 NOTE — H&P (Signed)
History and Physical  Patient Name: Joe Perry     [id number]    DOB: 01-18-1951    DOA: 01/31/2016 PCP: No PCP Per Patient   Patient coming from: Home     Chief Complaint: Ataxia  HPI: Joe Perry is a 65 y.o. male with a past medical history significant for hx of CVA on Plavix, NIDDM, and HTN who presents with ataxia for one day.  The patient was in his usual state of health until today when he woke up unable to walk. He describes having to hold onto the wall while walking to the bathroom, losing his balance and falling down while bending over, and generally having the sensation of falling to the right and falling over unless he was holding onto something. There is no vertigo or sensation of room movement. He did vomit. He had no syncope, no focal weakness, no numbness, no speech disturbance.  In the ED, he was afebrile, hemodynamically stable, slightly hypertensive. The electrolytes and renal function were normal. The total bilirubin was minimally elevated.  The coags were normal, WBC and platelets normal.  No anemia.  Troponin negative.  CT head with mild chronic small vessel disease.  CXR clear. ECG with sinus tachycardia, LVH and lateral TWI without previous for comparison.    He has had a previous "mini-stroke" 10 years ago, with unilateral numbness and difficulty walking, for which he takes aspirin and Plavix.      Review of Systems:  All other systems negative except as just noted or noted in the history of present illness.  Past Medical History  Diagnosis Date  . Hypertension   . Diabetes mellitus without complication ([place])   . Stroke [facility])     History reviewed. No pertinent past surgical history.  Social History: Patient lives alone.  Patient walks unassisted.  He is a Education officer, museum.  He does not smoker or drink alcohol.    No Known Allergies  Family history: Diabetes and hypertension, unspecified relatives, no history of stroke.  Prior to Admission  medications   Medication Sig Start Date End Date Taking? Authorizing Provider  aspirin EC 81 MG tablet Take 1 tablet (81 mg total) by mouth daily. 09/08/13   Reyne Dumas, MD  metFORMIN (GLUCOPHAGE) 1000 MG tablet Take 1 tablet (1,000 mg total) by mouth 2 (two) times daily with a meal. 09/10/13   Reyne Dumas, MD     Physical Exam: BP 150/86 mmHg  Pulse 75  Temp(Src) 98.3 F (36.8 C) (Oral)  Resp 11  SpO2 93% General appearance: Well-developed, adult male, alert and in no acute distress.   Head: Some head deformity, no injury. Eyes: Anicteric, conjunctiva pink, lids and lashes normal.     ENT: No nasal deformity, discharge, or epistaxis.  OP moist without lesions.   Skin: Warm and dry.   Cardiac: RRR, nl S1-S2, no murmurs appreciated.  Capillary refill is brisk.  JVP normal.  No LE edema.  Radial pulses 2+ and symmetric. Respiratory: Normal respiratory rate and rhythm.  CTAB without rales or wheezes. Abdomen: Abdomen soft without rigidity.  No TTP. No ascites, distension.   MSK: No deformities or effusions. Neuro: Pupils are 4 mm and reactive to 3 mm. Extraocular movements are intact, without nystagmus. Cranial nerve 5 is within normal limits. Cranial nerve 7 shows right facial droop. Cranial nerve 8 is within normal limits. Cranial nerves 9 and 10 reveal equal palate elevation. Cranial nerve 11 reveals sternocleidomastoid strong. Cranial nerve 12 is midline.  I do not note a deficit in motor strength testing in the upper and lower extremities bilaterally with normal motor, tone and bulk. He has no pronator drift.  He seems to titubate when sitting up to stand, and leans to the right when standing, but once he is erect he seems to be able to stay there, and he does not fall with his eyes closed. Finger-to-nose testing is abnormal on the LEFT. The patient is oriented to time, place and person. Speech is fluent. Naming is grossly intact. Recall, recent and remote, as well as general fund  of knowledge seem within normal limits. Attention span and concentration are within normal limits.   Psych: Behavior appropriate.  Affect normal.  No evidence of aural or visual hallucinations or delusions.       Labs on Admission:  The metabolic panel shows normal electrolytes and renal function, hyperglycemia. The complete blood count shows no anemia, thrombocytopenia, or leukocytosis. Troponin negative. Coags normal.   Radiological Exams on Admission: CT head without contrast 01/31/2016  Chronic small vessel disease  Personally reviewed: Dg Chest 2 View  01/31/2016  CLINICAL DATA:  Patient unsteady on feet, acute onset, with falls. Initial encounter. EXAM: CHEST  2 VIEW COMPARISON:  Chest radiograph performed 07/01/2010 FINDINGS: The lungs are well-aerated. Minimal bilateral atelectasis is noted. There is no evidence of pleural effusion or pneumothorax. The heart is normal in size; the mediastinal contour is within normal limits. No acute osseous abnormalities are seen. IMPRESSION: Minimal bilateral atelectasis noted. Lungs otherwise clear. No displaced rib fracture seen. Electronically Signed   By: Garald Balding M.D.   On: 01/31/2016 03:42   Ct Head Wo Contrast  01/30/2016  CLINICAL DATA:  Acute onset of dizziness and headache. Uncontrolled hypertension. Initial encounter. EXAM: CT HEAD WITHOUT CONTRAST TECHNIQUE: Contiguous axial images were obtained from the base of the skull through the vertex without intravenous contrast. COMPARISON:  None. FINDINGS: There is no evidence of acute infarction, mass lesion, or intra- or extra-axial hemorrhage on CT. Prominence of the ventricles and sulci suggests mild cortical volume loss. Scattered periventricular white matter change likely reflects small vessel ischemic microangiopathy. Chronic lacunar infarcts are seen at the basal ganglia bilaterally. The brainstem and fourth ventricle are within normal limits. The cerebral hemispheres demonstrate  grossly normal gray-white differentiation. No mass effect or midline shift is seen. There is no evidence of fracture; visualized osseous structures are unremarkable in appearance. The orbits are within normal limits. The paranasal sinuses and mastoid air cells are well-aerated. No significant soft tissue abnormalities are seen. IMPRESSION: 1. No acute intracranial pathology seen on CT. 2. Mild cortical volume loss and scattered small vessel ischemic microangiopathy. 3. Chronic lacunar infarcts at the basal ganglia bilaterally. Electronically Signed   By: Garald Balding M.D.   On: 01/30/2016 23:45   Mr Brain Wo Contrast  01/31/2016  CLINICAL DATA:  Initial evaluation for history of falls. EXAM: MRI HEAD WITHOUT CONTRAST TECHNIQUE: Multiplanar, multiecho pulse sequences of the brain and surrounding structures were obtained without intravenous contrast. COMPARISON:  Prior CT from. FINDINGS: Mild diffuse prominence of the CSF containing spaces is compatible with generalized cerebral atrophy. Patchy T2/FLAIR hyperintensity within the periventricular white matter most consistent with chronic small vessel ischemic disease, mild in nature. Remote lacunar infarcts present within the bilateral basal ganglia as well as the thalami. Small remote bilateral cerebellar infarcts. Associated mild chronic hemosiderin staining with a few of these infarcts. No abnormal foci of restricted diffusion to suggest acute infarct.  Gray-white matter differentiation maintained. Major intracranial vascular flow voids are preserved. No acute intracranial hemorrhage. No mass lesion, midline shift, or mass effect. No hydrocephalus. No extra-axial fluid collection. Major dural sinuses are grossly patent. Craniocervical junction within normal limits. Visualized upper cervical spine unremarkable. Pituitary gland within normal limits. No definite abnormality about the orbits, although they are incompletely visualized. Paranasal sinuses grossly clear.  Trace opacity within left mastoid air cells. Inner ear structures grossly unremarkable. Bone marrow signal intensity within normal limits. No scalp soft tissue abnormality. IMPRESSION: 1. No acute intracranial infarct or other process identified. 2. Multiple remote lacunar infarcts involving the bilateral basal ganglia, thalami, and left cerebellar hemisphere. 3. Mild atrophy with chronic small vessel ischemic disease. Electronically Signed   By: Jeannine Boga M.D.   On: 01/31/2016 05:32     EKG: Independently reviewed. Sinus tachycardia, lateral T-wave inversions no previous for comparison, LVH.   Echocardiogram 2015: EF 0000000, grade 1 diastolic dysfunction, no significant valvular disease.    Assessment/Plan 1. Acute TIA:  This is new.  MRI shows no acute infarct.  ABCD 5. -Admit to telemetry -Neuro checks, NIHSS per protocol -Daily aspirin 81 mg -Permissive hypertension for now -Lipids, hemoglobin A1c -Carotid doppler, MRA or CT angiography of head and neck deferred to Neurology -PT consultation -Consult to Neurology, appreciate recommendations -Continue home Plavix   2. NIDDM:  -Hold metformin  3. HTN:  -Permissive hypertension for now, hold HCTZ 12.5 mg       DVT prophylaxis: Lovenox  Code Status: FULL  Family Communication: None present  Disposition Plan: Anticipate Stroke work up per Neurology and consult to ancillary services.  Expect discharge within 1-2 days. Consults called: None Admission status: Telemetry, OBS status  Core measures: -VTE prophylaxis ordered at time of admission -Aspirin ordered at admission -Atrial fibrillation: not present -tPA not given because of duration of symptoms >24 hours -Dysphagia screen normal in ER -Lipids ordered -PT eval ordered    Medical decision making: Patient seen at 5:00 AM on 01/31/2016.  The patient was discussed with Dr. Randal Buba. What exists of the patient's chart was reviewed in depth.  Clinical  condition: stable.       Edwin Dada [facility] Pager [phone]

## 2016-01-31 NOTE — Consult Note (Signed)
Requesting Physician: Dr. Wendee Beavers    Chief Complaint: gait abnormality  History obtained from:  Patient     HPI:                                                                                                                                         Joe Perry is an 65 y.o. male with known CVA in the past to which he states caused decreased sensation on the left side. He awoke yesterday and noted he was falling forward and felt dizzy. He went to sleep after having a smoothy and upon waking again he felt off balance. For this reason he was brought to ED. CT head was normal. He continues to feel off balance and states his right facial droop is new.   Date last known well: 5.27.2017 Time last known well: Unable to determine tPA Given: No: out of window   Past Medical History  Diagnosis Date  . Hypertension   . Diabetes mellitus without complication ([place])   . Stroke [facility])     History reviewed. No pertinent past surgical history.  Family History  Problem Relation Age of Onset  . Diabetes    . Hypertension     Social History:  reports that he has never smoked. He does not have any smokeless tobacco history on file. He reports that he does not drink alcohol or use illicit drugs.  Allergies: No Known Allergies  Medications:                                                                                                                           Prior to Admission:  Prescriptions prior to admission  Medication Sig Dispense Refill Last Dose  . aspirin EC 81 MG tablet Take 1 tablet (81 mg total) by mouth daily. 30 tablet 3 01/30/2016 at 0800  . clopidogrel (PLAVIX) 75 MG tablet Take 75 mg by mouth daily.   01/30/2016 at 0800  . hydrochlorothiazide (MICROZIDE) 12.5 MG capsule Take 12.5 mg by mouth daily.   01/30/2016 at 0800  . metFORMIN (GLUCOPHAGE) 1000 MG tablet Take 1 tablet (1,000 mg total) by mouth 2 (two) times daily with a meal. (Patient taking differently: Take 1,000 mg by mouth  daily with breakfast. ) 180 tablet 3 01/30/2016 at 0800   Scheduled: . aspirin  300 mg Rectal Daily   Or  .  aspirin  325 mg Oral Daily  . clopidogrel  75 mg Oral Daily  . enoxaparin (LOVENOX) injection  40 mg Subcutaneous Daily  . insulin aspart  0-15 Units Subcutaneous TID WC  . insulin aspart  0-5 Units Subcutaneous QHS  . metFORMIN  1,000 mg Oral Q breakfast    ROS:                                                                                                                                       History obtained from the patient  General ROS: negative for - chills, fatigue, fever, night sweats, weight gain or weight loss Psychological ROS: negative for - behavioral disorder, hallucinations, memory difficulties, mood swings or suicidal ideation Ophthalmic ROS: negative for - blurry vision, double vision, eye pain or loss of vision ENT ROS: negative for - epistaxis, nasal discharge, oral lesions, sore throat, tinnitus or vertigo Allergy and Immunology ROS: negative for - hives or itchy/watery eyes Hematological and Lymphatic ROS: negative for - bleeding problems, bruising or swollen lymph nodes Endocrine ROS: negative for - galactorrhea, hair pattern changes, polydipsia/polyuria or temperature intolerance Respiratory ROS: negative for - cough, hemoptysis, shortness of breath or wheezing Cardiovascular ROS: negative for - chest pain, dyspnea on exertion, edema or irregular heartbeat Gastrointestinal ROS: negative for - abdominal pain, diarrhea, hematemesis, nausea/vomiting or stool incontinence Genito-Urinary ROS: negative for - dysuria, hematuria, incontinence or urinary frequency/urgency Musculoskeletal ROS: negative for - joint swelling or muscular weakness Neurological ROS: as noted in HPI Dermatological ROS: negative for rash and skin lesion changes  Neurologic Examination:                                                                                                      Blood  pressure 149/93, pulse 92, temperature 99 F (37.2 C), temperature source Oral, resp. rate 16, height 5\' 9"  (1.753 m), weight 84.369 kg (186 lb), SpO2 94 %.  HEENT-  Normocephalic, no lesions, without obvious abnormality.  Normal external eye and conjunctiva.  Normal TM's bilaterally.  Normal auditory canals and external ears. Normal external nose, mucus membranes and septum.  Normal pharynx. Cardiovascular- S1, S2 normal, pulses palpable throughout   Lungs- chest clear, no wheezing, rales, normal symmetric air entry Abdomen- normal findings: bowel sounds normal Extremities- no edema Lymph-no adenopathy palpable Musculoskeletal-no joint tenderness, deformity or swelling Skin-warm and dry, no hyperpigmentation, vitiligo, or suspicious lesions  Neurological Examination Mental Status: Alert, oriented, thought content appropriate.  Speech fluent without evidence  of aphasia.  Able to follow 3 step commands without difficulty. Cranial Nerves: II:  Visual fields grossly normal, pupils equal, round, reactive to light and accommodation III,IV, VI: ptosis not present, extra-ocular motions intact bilaterally V,VII: smile asymmetric on the right, facial light touch sensation normal bilaterally VIII: hearing normal bilaterally IX,X: uvula rises symmetrically XI: bilateral shoulder shrug XII: midline tongue extension Motor: Right : Upper extremity   5/5    Left:     Upper extremity   5/5  Lower extremity   5/5     Lower extremity   5/5 Tone and bulk:normal tone throughout; no atrophy noted Sensory: Pinprick and light touch decreased on the left (old) Deep Tendon Reflexes: 2+ and symmetric throughout Plantars: Right: downgoing   Left: downgoing Cerebellar: Dysmetric on the left (old) and slightly dysmetric on the right finger-to-nose and normal heel-to-shin test on th eright and dysmetric on the left (old) Gait: not tested       Lab Results: Basic Metabolic Panel:  Recent Labs Lab  01/30/16 2304 01/30/16 2318 01/31/16 0858  NA 140 143  --   K 3.7 3.7  --   CL 100* 99*  --   CO2 26  --   --   GLUCOSE 233* 236*  --   BUN 21* 23*  --   CREATININE 1.00 0.90 1.03  CALCIUM 10.1  --   --     Liver Function Tests:  Recent Labs Lab 01/30/16 2304  AST 17  ALT 26  ALKPHOS 57  BILITOT 1.6*  PROT 7.9  ALBUMIN 4.4   No results for input(s): LIPASE, AMYLASE in the last 168 hours. No results for input(s): AMMONIA in the last 168 hours.  CBC:  Recent Labs Lab 01/30/16 2304 01/30/16 2318 01/31/16 0858  WBC 9.0  --  10.8*  NEUTROABS 6.6  --   --   HGB 14.6 16.0 14.7  HCT 44.8 47.0 45.4  MCV 92.6  --  94.8  PLT 263  --  241    Cardiac Enzymes: No results for input(s): CKTOTAL, CKMB, CKMBINDEX, TROPONINI in the last 168 hours.  Lipid Panel:  Recent Labs Lab 01/31/16 0858  CHOL 118  TRIG 96  HDL 48  CHOLHDL 2.5  VLDL 19  LDLCALC 51    CBG:  Recent Labs Lab 01/30/16 2246 01/31/16 0756  GLUCAP 214* 109*    Microbiology: No results found for this or any previous visit.  Coagulation Studies:  Recent Labs  01/30/16 2304  LABPROT 14.3  INR 1.09    Imaging: Dg Chest 2 View  01/31/2016  CLINICAL DATA:  Patient unsteady on feet, acute onset, with falls. Initial encounter. EXAM: CHEST  2 VIEW COMPARISON:  Chest radiograph performed 07/01/2010 FINDINGS: The lungs are well-aerated. Minimal bilateral atelectasis is noted. There is no evidence of pleural effusion or pneumothorax. The heart is normal in size; the mediastinal contour is within normal limits. No acute osseous abnormalities are seen. IMPRESSION: Minimal bilateral atelectasis noted. Lungs otherwise clear. No displaced rib fracture seen. Electronically Signed   By: Garald Balding M.D.   On: 01/31/2016 03:42   Ct Head Wo Contrast  01/30/2016  CLINICAL DATA:  Acute onset of dizziness and headache. Uncontrolled hypertension. Initial encounter. EXAM: CT HEAD WITHOUT CONTRAST TECHNIQUE:  Contiguous axial images were obtained from the base of the skull through the vertex without intravenous contrast. COMPARISON:  None. FINDINGS: There is no evidence of acute infarction, mass lesion, or intra- or extra-axial hemorrhage on  CT. Prominence of the ventricles and sulci suggests mild cortical volume loss. Scattered periventricular white matter change likely reflects small vessel ischemic microangiopathy. Chronic lacunar infarcts are seen at the basal ganglia bilaterally. The brainstem and fourth ventricle are within normal limits. The cerebral hemispheres demonstrate grossly normal gray-white differentiation. No mass effect or midline shift is seen. There is no evidence of fracture; visualized osseous structures are unremarkable in appearance. The orbits are within normal limits. The paranasal sinuses and mastoid air cells are well-aerated. No significant soft tissue abnormalities are seen. IMPRESSION: 1. No acute intracranial pathology seen on CT. 2. Mild cortical volume loss and scattered small vessel ischemic microangiopathy. 3. Chronic lacunar infarcts at the basal ganglia bilaterally. Electronically Signed   By: Garald Balding M.D.   On: 01/30/2016 23:45   Mr Brain Wo Contrast  01/31/2016  CLINICAL DATA:  Initial evaluation for history of falls. EXAM: MRI HEAD WITHOUT CONTRAST TECHNIQUE: Multiplanar, multiecho pulse sequences of the brain and surrounding structures were obtained without intravenous contrast. COMPARISON:  Prior CT from. FINDINGS: Mild diffuse prominence of the CSF containing spaces is compatible with generalized cerebral atrophy. Patchy T2/FLAIR hyperintensity within the periventricular white matter most consistent with chronic small vessel ischemic disease, mild in nature. Remote lacunar infarcts present within the bilateral basal ganglia as well as the thalami. Small remote bilateral cerebellar infarcts. Associated mild chronic hemosiderin staining with a few of these infarcts. No  abnormal foci of restricted diffusion to suggest acute infarct. Gray-white matter differentiation maintained. Major intracranial vascular flow voids are preserved. No acute intracranial hemorrhage. No mass lesion, midline shift, or mass effect. No hydrocephalus. No extra-axial fluid collection. Major dural sinuses are grossly patent. Craniocervical junction within normal limits. Visualized upper cervical spine unremarkable. Pituitary gland within normal limits. No definite abnormality about the orbits, although they are incompletely visualized. Paranasal sinuses grossly clear. Trace opacity within left mastoid air cells. Inner ear structures grossly unremarkable. Bone marrow signal intensity within normal limits. No scalp soft tissue abnormality. IMPRESSION: 1. No acute intracranial infarct or other process identified. 2. Multiple remote lacunar infarcts involving the bilateral basal ganglia, thalami, and left cerebellar hemisphere. 3. Mild atrophy with chronic small vessel ischemic disease. Electronically Signed   By: Jeannine Boga M.D.   On: 01/31/2016 05:32       Assessment and plan discussed with with attending physician and they are in agreement.    Etta Quill PA-C [facility] [phone]  01/31/2016, 11:35 AM   Assessment: 65 y.o. male with new right facial droop and gait instability. Suspect new acute CVA. Will obtain MRI /MRI brain. If acute stroke would recommend stroke work up.   Stroke Risk Factors - diabetes mellitus and hypertension

## 2016-02-01 DIAGNOSIS — I10 Essential (primary) hypertension: Secondary | ICD-10-CM

## 2016-02-01 DIAGNOSIS — R270 Ataxia, unspecified: Secondary | ICD-10-CM

## 2016-02-01 DIAGNOSIS — R258 Other abnormal involuntary movements: Secondary | ICD-10-CM

## 2016-02-01 DIAGNOSIS — E119 Type 2 diabetes mellitus without complications: Secondary | ICD-10-CM

## 2016-02-01 DIAGNOSIS — Z8673 Personal history of transient ischemic attack (TIA), and cerebral infarction without residual deficits: Secondary | ICD-10-CM

## 2016-02-01 DIAGNOSIS — I69398 Other sequelae of cerebral infarction: Secondary | ICD-10-CM

## 2016-02-01 DIAGNOSIS — R269 Unspecified abnormalities of gait and mobility: Secondary | ICD-10-CM

## 2016-02-01 LAB — GLUCOSE, CAPILLARY
GLUCOSE-CAPILLARY: 117 mg/dL — AB (ref 65–99)
GLUCOSE-CAPILLARY: 123 mg/dL — AB (ref 65–99)
GLUCOSE-CAPILLARY: 140 mg/dL — AB (ref 65–99)
Glucose-Capillary: 111 mg/dL — ABNORMAL HIGH (ref 65–99)

## 2016-02-01 LAB — HEMOGLOBIN A1C
HEMOGLOBIN A1C: 8.3 % — AB (ref 4.8–5.6)
MEAN PLASMA GLUCOSE: 192 mg/dL

## 2016-02-01 LAB — TROPONIN I: Troponin I: <0.03 ng/mL (ref <0.031)

## 2016-02-01 MED ORDER — BACLOFEN 10 MG PO TABS
10.0000 mg | ORAL_TABLET | Freq: Every day | ORAL | Status: DC
  Administered 2016-02-01: 10 mg via ORAL
  Filled 2016-02-01: qty 1

## 2016-02-01 NOTE — Care Management Note (Signed)
Case Management Note  Patient Details  Name: Joe Perry MRN: [id number] Date of Birth: [birth year]  Subjective/Objective:                    Action/Plan: CM able to obtain patient an appointment at the [facility] who is doing overflow for the [facility]. Information placed on the AVS. CM continuing to follow for d/c needs.  Expected Discharge Date:                  Expected Discharge Plan:     In-House Referral:     Discharge planning Services     Post Acute Care Choice:    Choice offered to:     DME Arranged:    DME Agency:     HH Arranged:    HH Agency:     Status of Service:  In process, will continue to follow  Medicare Important Message Given:    Date Medicare IM Given:    Medicare IM give by:    Date Additional Medicare IM Given:    Additional Medicare Important Message give by:     If discussed at [place] of Stay Meetings, dates discussed:    Additional Comments:  Pollie Friar, RN 02/01/2016, 10:37 AM

## 2016-02-01 NOTE — Progress Notes (Signed)
NP K. Schorr repaged.

## 2016-02-01 NOTE — Consult Note (Signed)
Physical Medicine and Rehabilitation Consult   Reason for Consult: Ataxic gait.  Referring Physician: Dr. Wendee Beavers   HPI: Joe Perry is a 65 y.o. male with history of HTN, T2DM, history of vertigo, CVA who was admitted on 01/31/16 with dizziness and difficulty walking.  Patient reported having worsening of gait for months and neurologic exam revealed RUE/RLE and facial spasticity with severe spastic gait. MRI brain done revealing multiple remote lacunar infarcts involving bilateral basal ganglia and left cerebellar hemisphere with mild atrophy and SVD.  Dr. Nyoka Cowden recommended baclofen for spasticity and therapy for gait/fall prevention. PT evaluation done revealing ataxic gait, narrow BOS, right lean and staggering gait. CIR recommended for follow up therapy.   Patient states that his prior stroke was approximately 10 years ago. He has been working part time as a  Oceanographer. He is independent at home.  Review of Systems  Constitutional: Negative.   HENT: Negative.   Eyes: Negative.   Respiratory: Positive for cough. Negative for sputum production and shortness of breath.   Cardiovascular: Negative.   Gastrointestinal: Negative.   Genitourinary: Positive for urgency.  Musculoskeletal: Negative.   Skin: Negative.   Neurological: Positive for focal weakness.       Chronic left sided weakness  Psychiatric/Behavioral: Negative.       Past Medical History  Diagnosis Date  . Hypertension   . Diabetes mellitus without complication ([place])   . Stroke [facility])     History reviewed. No pertinent past surgical history.    Family History  Problem Relation Age of Onset  . Diabetes    . Hypertension      Social History:  Lives alone. Works as a Oceanographer.  reports that he has never smoked. He does not have any smokeless tobacco history on file. He reports that he does not drink alcohol or use illicit drugs.    Allergies: No Known Allergies    Medications  Prior to Admission  Medication Sig Dispense Refill  . aspirin EC 81 MG tablet Take 1 tablet (81 mg total) by mouth daily. 30 tablet 3  . clopidogrel (PLAVIX) 75 MG tablet Take 75 mg by mouth daily.    . hydrochlorothiazide (MICROZIDE) 12.5 MG capsule Take 12.5 mg by mouth daily.    . metFORMIN (GLUCOPHAGE) 1000 MG tablet Take 1 tablet (1,000 mg total) by mouth 2 (two) times daily with a meal. (Patient taking differently: Take 1,000 mg by mouth daily with breakfast. 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 No dizziness.  Transfers Overall transfer level: Needs assistance Equipment used: None Transfers: Sit to/from Stand Sit to Stand: Min assist, Min guard General transfer comment: Min A to steady in standing with pt reaching for UE support. Stood from chair x3. Progressed to Min guard assist but unsteady in standing each time. Cues to slow down. Ambulation/Gait Ambulation/Gait assistance: Mod assist Ambulation Distance (Feet): 100 Feet (x3 bouts) Assistive device: 1 person hand held  assist (rail in hallway) Gait Pattern/deviations: Ataxic, Staggering right, Staggering left, Decreased weight shift to left, Step-through pattern General Gait Details: pt with ataxic gait pattern with right lateral lean. Not aware of leaning to the right, able to self correct with  manual and verbal cues. Holding rail for first 2 bouts and HHA for 3rd bout. Cues to decrease gait speed. Gait velocity: decreased    ADL:    Cognition: Cognition Overall Cognitive Status: Within Functional Limits for tasks assessed Orientation Level: Oriented X4 Cognition Arousal/Alertness: Awake/alert Behavior During Therapy: WFL for tasks assessed/performed Overall Cognitive Status: Within Functional Limits for tasks assessed  Blood pressure 120/80, pulse 72, temperature 98.1 F (36.7 C), temperature source Oral, resp. rate 18, height 5\' 9"  (1.753 m), weight 84.369 kg (186 lb), SpO2 99 %. Physical Exam  Nursing note and vitals reviewed. Constitutional: He is oriented to person, place, and time. He appears well-developed and well-nourished.  HENT:  Head: Normocephalic and atraumatic.  Eyes: Conjunctivae and EOM are normal. Pupils are equal, round, and reactive to light.  Neck: Normal range of motion.  Cardiovascular: Normal rate, regular rhythm and normal heart sounds.   Respiratory: Effort normal and breath sounds normal.  GI: Soft. Bowel sounds are normal.  Musculoskeletal: Normal range of motion. He exhibits no edema or tenderness.  Neurological: He is alert and oriented to person, place, and time. He exhibits normal muscle tone. Coordination abnormal.  Skin: Skin is warm and dry. No erythema.  Psychiatric: He has a normal mood and affect. His speech is normal and behavior is normal. Thought content normal. He expresses impulsivity.  Motor strength is 4/5 in the left deltoid, biceps, triceps, grip, 5/5 in the right deltoid, bicep, tricep, grip 5/5 bilateral hip flexor knee extensor and flexor Standing balance is fair Ambulates with supervision assistance using a rolling walker. Able to negotiate tight spaces Dysmetria on left finger-nose-finger No evidence of dysmetria on right finger-nose-finger  Results for orders placed or performed during the hospital encounter of  01/31/16 (from the past 24 hour(s))  Glucose, capillary     Status: Abnormal   Collection Time: 01/31/16  4:20 PM  Result Value Ref Range   Glucose-Capillary 128 (H) 65 - 99 mg/dL  Glucose, capillary     Status: Abnormal   Collection Time: 01/31/16  9:54 PM  Result Value Ref Range   Glucose-Capillary 142 (H) 65 - 99 mg/dL   Comment 1 Notify RN    Comment 2 Document in Chart   Glucose, capillary     Status: Abnormal   Collection Time: 02/01/16  6:13 AM  Result Value Ref Range   Glucose-Capillary 117 (H) 65 - 99 mg/dL   Comment 1 Notify RN    Comment 2 Document in Chart   Glucose, capillary     Status: Abnormal   Collection Time: 02/01/16 11:23 AM  Result Value Ref Range   Glucose-Capillary 123 (H) 65 - 99 mg/dL   Comment 1 Notify RN    Comment 2 Document in Chart    Dg Chest 2 View  01/31/2016  CLINICAL DATA:  Patient unsteady on feet, acute onset, with falls. Initial encounter. EXAM: CHEST  2 VIEW COMPARISON:  Chest radiograph performed 07/01/2010 FINDINGS: The lungs are well-aerated. Minimal bilateral atelectasis is noted. There is no evidence of pleural effusion or pneumothorax. The heart is normal in size; the mediastinal contour is within normal limits. No acute osseous abnormalities are seen. IMPRESSION: Minimal bilateral atelectasis noted. Lungs otherwise clear. No displaced  rib fracture seen. Electronically Signed   By: Garald Balding M.D.   On: 01/31/2016 03:42   Ct Head Wo Contrast  01/30/2016  CLINICAL DATA:  Acute onset of dizziness and headache. Uncontrolled hypertension. Initial encounter. EXAM: CT HEAD WITHOUT CONTRAST TECHNIQUE: Contiguous axial images were obtained from the base of the skull through the vertex without intravenous contrast. COMPARISON:  None. FINDINGS: There is no evidence of acute infarction, mass lesion, or intra- or extra-axial hemorrhage on CT. Prominence of the ventricles and sulci suggests mild cortical volume loss. Scattered periventricular white  matter change likely reflects small vessel ischemic microangiopathy. Chronic lacunar infarcts are seen at the basal ganglia bilaterally. The brainstem and fourth ventricle are within normal limits. The cerebral hemispheres demonstrate grossly normal gray-white differentiation. No mass effect or midline shift is seen. There is no evidence of fracture; visualized osseous structures are unremarkable in appearance. The orbits are within normal limits. The paranasal sinuses and mastoid air cells are well-aerated. No significant soft tissue abnormalities are seen. IMPRESSION: 1. No acute intracranial pathology seen on CT. 2. Mild cortical volume loss and scattered small vessel ischemic microangiopathy. 3. Chronic lacunar infarcts at the basal ganglia bilaterally. Electronically Signed   By: Garald Balding M.D.   On: 01/30/2016 23:45   Mr Brain Wo Contrast  01/31/2016  CLINICAL DATA:  Initial evaluation for history of falls. EXAM: MRI HEAD WITHOUT CONTRAST TECHNIQUE: Multiplanar, multiecho pulse sequences of the brain and surrounding structures were obtained without intravenous contrast. COMPARISON:  Prior CT from. FINDINGS: Mild diffuse prominence of the CSF containing spaces is compatible with generalized cerebral atrophy. Patchy T2/FLAIR hyperintensity within the periventricular white matter most consistent with chronic small vessel ischemic disease, mild in nature. Remote lacunar infarcts present within the bilateral basal ganglia as well as the thalami. Small remote bilateral cerebellar infarcts. Associated mild chronic hemosiderin staining with a few of these infarcts. No abnormal foci of restricted diffusion to suggest acute infarct. Gray-white matter differentiation maintained. Major intracranial vascular flow voids are preserved. No acute intracranial hemorrhage. No mass lesion, midline shift, or mass effect. No hydrocephalus. No extra-axial fluid collection. Major dural sinuses are grossly patent.  Craniocervical junction within normal limits. Visualized upper cervical spine unremarkable. Pituitary gland within normal limits. No definite abnormality about the orbits, although they are incompletely visualized. Paranasal sinuses grossly clear. Trace opacity within left mastoid air cells. Inner ear structures grossly unremarkable. Bone marrow signal intensity within normal limits. No scalp soft tissue abnormality. IMPRESSION: 1. No acute intracranial infarct or other process identified. 2. Multiple remote lacunar infarcts involving the bilateral basal ganglia, thalami, and left cerebellar hemisphere. 3. Mild atrophy with chronic small vessel ischemic disease. Electronically Signed   By: Jeannine Boga M.D.   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 Does the need for close, 24 hr/day medical supervision in concert with the patient's rehab needs make it unreasonable for this patient to be served in a less intensive setting? No 2. Co-Morbidities requiring supervision/potential complications: Diabetes 3. Due to bladder management and disease management, does the patient require 24 hr/day rehab nursing? Potentially 4. Does the patient require coordinated care of a physician, rehab nurse, PT, OT to address physical and functional deficits in the context of the above medical diagnosis(es)? No Addressing deficits in the following areas: balance, locomotion and toileting 5. Can the patient actively participate in  an intensive therapy program of at least 3 hrs of therapy per day at least 5 days per week? Yes 6. The potential for patient to make measurable gains while on inpatient rehab is nnot applicable 7. Anticipated functional outcomes upon discharge from inpatient rehab are n/a  with PT, n/a with OT, n/a with SLP. 8. Estimated rehab length of  stay to reach the above functional goals is: Not applicable 9. Does the patient have adequate social supports and living environment to accommodate these discharge functional goals? Yes 10. Anticipated D/C setting: Home 11. Anticipated post D/C treatments: [place] therapy 12. Overall Rehab/Functional Prognosis: good  RECOMMENDATIONS: This patient's condition is appropriate for continued rehabilitative care in the following setting: Home with home health Patient has agreed to participate in recommended program. patient wishes to go home Note that insurance prior authorization may be required for reimbursement for recommended care.  Comment: Patient improved from mod assist yesterday to supervision today. I would think in another day he should be back to his usual baseline. Would recommend physical medicine and rehabilitation follow-up as an outpatient. He will need home health PT and OT    02/01/2016

## 2016-02-01 NOTE — Progress Notes (Signed)
Interval History:                                                                                                                      Joe Perry is an 65 y.o. male patient with worsening gait difficulty for past couple months. MRI brain shows no acute stroke and chronic left basal ganglia stroke. He feels slightly better today after receiving Baclofen but continues to feel dizzy when standing.    Past Medical History: Past Medical History  Diagnosis Date  . Hypertension   . Diabetes mellitus without complication ([place])   . Stroke [facility])     History reviewed. No pertinent past surgical history.  Family History: Family History  Problem Relation Age of Onset  . Diabetes    . Hypertension      Social History:   reports that he has never smoked. He does not have any smokeless tobacco history on file. He reports that he does not drink alcohol or use illicit drugs.  Allergies:  No Known Allergies   Medications:                                                                                                                         Current facility-administered medications:  .  acetaminophen (TYLENOL) tablet 650 mg, 650 mg, Oral, Q4H PRN **OR** acetaminophen (TYLENOL) suppository 650 mg, 650 mg, Rectal, Q4H PRN, Edwin Dada, MD .  aspirin suppository 300 mg, 300 mg, Rectal, Daily **OR** aspirin tablet 325 mg, 325 mg, Oral, Daily, Edwin Dada, MD, 325 mg at 02/01/16 0901 .  baclofen (LIORESAL) tablet 10 mg, 10 mg, Oral, Q2200, Ram Fuller Mandril, MD .  baclofen (LIORESAL) tablet 5 mg, 5 mg, Oral, BID, Ram Fuller Mandril, MD, 5 mg at 02/01/16 0901 .  clopidogrel (PLAVIX) tablet 75 mg, 75 mg, Oral, Daily, Edwin Dada, MD, 75 mg at 02/01/16 0901 .  enoxaparin (LOVENOX) injection 40 mg, 40 mg, Subcutaneous, Daily, Edwin Dada, MD, 40 mg at 02/01/16 0900 .  insulin aspart (novoLOG) injection 0-15 Units, 0-15 Units, Subcutaneous, TID  WC, Edwin Dada, MD, 2 Units at 01/31/16 1701 .  insulin aspart (novoLOG) injection 0-5 Units, 0-5 Units, Subcutaneous, QHS, Edwin Dada, MD, 0 Units at 01/31/16 2200 .  metFORMIN (GLUCOPHAGE) tablet 1,000 mg, 1,000 mg, Oral, Q breakfast, Edwin Dada, MD, 1,000 mg at 02/01/16 0753 .  senna-docusate (Senokot-S) tablet 1 tablet, 1 tablet, Oral, QHS PRN,  Edwin Dada, MD   Neurologic Examination:                                                                                                     Today's Vitals   01/31/16 2157 02/01/16 0056 02/01/16 0535 02/01/16 1023  BP: 126/80 123/71 137/83 125/75  Pulse: 77 79 79 76  Temp: 98.5 F (36.9 C) 98.7 F (37.1 C) 98 F (36.7 C) 98.4 F (36.9 C)  TempSrc: Oral Oral Oral Oral  Resp: 16 16 18 18   Height:      Weight:      SpO2: 96% 100% 97% 98%  PainSc:        Evaluation of higher integrative functions including: Level of alertness: Alert,  Oriented to time, place and person Speech: fluent, no evidence of dysarthria or aphasia noted.  Test the following cranial nerves: right face does not move when smiling.  Motor examination: increased ton in the left arm and leg with increased spasticity on the left.  Examination of sensation : Normal and symmetric sensation to pinprick in all 4 extremities and on face Examination of deep tendon reflexes: 2+, normal and symmetric in all extremities, normal plantars bilaterally Test coordination: Normal finger nose testing with end point tremor bilaterally, with no evidence of limb appendicular ataxia or abnormal involuntary movements or tremors noted.  Gait: remains spastic.    Lab Results: Basic Metabolic Panel:  Recent Labs Lab 01/30/16 2304 01/30/16 2318 01/31/16 0858  NA 140 143  --   K 3.7 3.7  --   CL 100* 99*  --   CO2 26  --   --   GLUCOSE 233* 236*  --   BUN 21* 23*  --   CREATININE 1.00 0.90 1.03  CALCIUM 10.1  --   --     Liver Function  Tests:  Recent Labs Lab 01/30/16 2304  AST 17  ALT 26  ALKPHOS 57  BILITOT 1.6*  PROT 7.9  ALBUMIN 4.4   No results for input(s): LIPASE, AMYLASE in the last 168 hours. No results for input(s): AMMONIA in the last 168 hours.  CBC:  Recent Labs Lab 01/30/16 2304 01/30/16 2318 01/31/16 0858  WBC 9.0  --  10.8*  NEUTROABS 6.6  --   --   HGB 14.6 16.0 14.7  HCT 44.8 47.0 45.4  MCV 92.6  --  94.8  PLT 263  --  241    Cardiac Enzymes: No results for input(s): CKTOTAL, CKMB, CKMBINDEX, TROPONINI in the last 168 hours.  Lipid Panel:  Recent Labs Lab 01/31/16 0858  CHOL 118  TRIG 96  HDL 48  CHOLHDL 2.5  VLDL 19  LDLCALC 51    CBG:  Recent Labs Lab 01/31/16 0756 01/31/16 1128 01/31/16 1620 01/31/16 2154 02/01/16 0613  GLUCAP 109* 187* 128* 142* 117*    Microbiology: No results found for this or any previous visit.  Imaging: Dg Chest 2 View  01/31/2016  CLINICAL DATA:  Patient unsteady on feet, acute onset, with falls. Initial encounter. EXAM: CHEST  2 VIEW COMPARISON:  Chest radiograph performed 07/01/2010 FINDINGS: The lungs are well-aerated. Minimal bilateral atelectasis is noted. There is no evidence of pleural effusion or pneumothorax. The heart is normal in size; the mediastinal contour is within normal limits. No acute osseous abnormalities are seen. IMPRESSION: Minimal bilateral atelectasis noted. Lungs otherwise clear. No displaced rib fracture seen. Electronically Signed   By: Garald Balding M.D.   On: 01/31/2016 03:42   Ct Head Wo Contrast  01/30/2016  CLINICAL DATA:  Acute onset of dizziness and headache. Uncontrolled hypertension. Initial encounter. EXAM: CT HEAD WITHOUT CONTRAST TECHNIQUE: Contiguous axial images were obtained from the base of the skull through the vertex without intravenous contrast. COMPARISON:  None. FINDINGS: There is no evidence of acute infarction, mass lesion, or intra- or extra-axial hemorrhage on CT. Prominence of the  ventricles and sulci suggests mild cortical volume loss. Scattered periventricular white matter change likely reflects small vessel ischemic microangiopathy. Chronic lacunar infarcts are seen at the basal ganglia bilaterally. The brainstem and fourth ventricle are within normal limits. The cerebral hemispheres demonstrate grossly normal gray-white differentiation. No mass effect or midline shift is seen. There is no evidence of fracture; visualized osseous structures are unremarkable in appearance. The orbits are within normal limits. The paranasal sinuses and mastoid air cells are well-aerated. No significant soft tissue abnormalities are seen. IMPRESSION: 1. No acute intracranial pathology seen on CT. 2. Mild cortical volume loss and scattered small vessel ischemic microangiopathy. 3. Chronic lacunar infarcts at the basal ganglia bilaterally. Electronically Signed   By: Garald Balding M.D.   On: 01/30/2016 23:45   Mr Brain Wo Contrast  01/31/2016  CLINICAL DATA:  Initial evaluation for history of falls. EXAM: MRI HEAD WITHOUT CONTRAST TECHNIQUE: Multiplanar, multiecho pulse sequences of the brain and surrounding structures were obtained without intravenous contrast. COMPARISON:  Prior CT from. FINDINGS: Mild diffuse prominence of the CSF containing spaces is compatible with generalized cerebral atrophy. Patchy T2/FLAIR hyperintensity within the periventricular white matter most consistent with chronic small vessel ischemic disease, mild in nature. Remote lacunar infarcts present within the bilateral basal ganglia as well as the thalami. Small remote bilateral cerebellar infarcts. Associated mild chronic hemosiderin staining with a few of these infarcts. No abnormal foci of restricted diffusion to suggest acute infarct. Gray-white matter differentiation maintained. Major intracranial vascular flow voids are preserved. No acute intracranial hemorrhage. No mass lesion, midline shift, or mass effect. No  hydrocephalus. No extra-axial fluid collection. Major dural sinuses are grossly patent. Craniocervical junction within normal limits. Visualized upper cervical spine unremarkable. Pituitary gland within normal limits. No definite abnormality about the orbits, although they are incompletely visualized. Paranasal sinuses grossly clear. Trace opacity within left mastoid air cells. Inner ear structures grossly unremarkable. Bone marrow signal intensity within normal limits. No scalp soft tissue abnormality. IMPRESSION: 1. No acute intracranial infarct or other process identified. 2. Multiple remote lacunar infarcts involving the bilateral basal ganglia, thalami, and left cerebellar hemisphere. 3. Mild atrophy with chronic small vessel ischemic disease. Electronically Signed   By: Jeannine Boga M.D.   On: 01/31/2016 05:32    Assessment and plan:   Joe Perry is an 65 y.o. male patient with spastic gait and increased tone on the left arm and leg. Recommend continuing with Baclofen 5 mg in morning,5 mg after lunch, and 10 mg at bedtime along with PT.   Will be seen by Dr. Silverio Decamp. Please see his attestation note for A/P for any additional work up recommendations.

## 2016-02-01 NOTE — Progress Notes (Signed)
Rehab Admissions Coordinator Note:  Patient was screened by Cleatrice Burke for appropriateness for an Inpatient Acute Rehab Consult per PT recommendation. Pt under observation status with medical workup ongoing. Question medical necessity for an inpt hospital rehab admission with current diagnosis. Recommend await further progress and workup before determining rehab venue options.  Cleatrice Burke 02/01/2016, 8:39 AM  I can be reached at [phone].

## 2016-02-01 NOTE — Progress Notes (Signed)
PROGRESS NOTE    Joe Perry  [id number] DOB: July 27, 1951 DOA: 01/31/2016 PCP: No PCP Per Patient   Brief Narrative:  65 y.o. male with a past medical history significant for hx of CVA on Plavix, NIDDM, and HTN who presents with ataxia for one day.  The patient was in his usual state of health until today when he woke up unable to walk. He describes having to hold onto the wall while walking to the bathroom, losing his balance and falling down while bending over, and generally having the sensation of falling to the right and falling over unless he was holding onto something   Assessment & Plan:   Active Problems:   CVA (cerebral infarction) -Stroke workup has been negative. MRI reports no acute intracranial infarct -Neurology on board and currently things patient spasticity is from prior stroke. - Physical therapy has evaluated patient and recommended CIR. Consult placed  Dizziness - We'll obtain orthostatic vital signs    Type 2 diabetes mellitus without complication, without long-term current use of insulin ([name]) - We'll continue carb modified diet and continue sliding scale insulin    Essential hypertension - Relatively well controlled    Ataxia - Physical therapy consulted and recommending CIR as listed above  DVT prophylaxis: Lovenox Code Status: Full Family Communication: None at bedside Disposition Plan: Pending improvement in condition   Consultants:   Neurology   Procedures: None   Antimicrobials: None   Subjective: Patient reports feeling dizzy when getting up.  Objective: Filed Vitals:   02/01/16 0056 02/01/16 0535 02/01/16 1023 02/01/16 1430  BP: 123/71 137/83 125/75 120/80  Pulse: 79 79 76 72  Temp: 98.7 F (37.1 C) 98 F (36.7 C) 98.4 F (36.9 C) 98.1 F (36.7 C)  TempSrc: Oral Oral Oral Oral  Resp: 16 18 18 18   Height:      Weight:      SpO2: 100% 97% 98% 99%   No intake or output data in the 24 hours ending 02/01/16  1513 Filed Weights   01/31/16 0613  Weight: 84.369 kg (186 lb)    Examination:  General exam: Appears calm and comfortable  Respiratory system: Clear to auscultation. Respiratory effort normal. Cardiovascular system: S1 & S2 heard, RRR. No JVD, murmurs, rubs, gallops or clicks. No pedal edema. Gastrointestinal system: Abdomen is nondistended, soft and nontender. No organomegaly or masses felt. Normal bowel sounds heard. Central nervous system: Alert and oriented. No focal neurological deficits. Skin: No rashes, lesions or ulcers Psychiatry: Judgement and insight appear normal. Mood & affect appropriate.  Head: atraumatic  Data Reviewed: I have personally reviewed following labs and imaging studies  CBC:  Recent Labs Lab 01/30/16 2304 01/30/16 2318 01/31/16 0858  WBC 9.0  --  10.8*  NEUTROABS 6.6  --   --   HGB 14.6 16.0 14.7  HCT 44.8 47.0 45.4  MCV 92.6  --  94.8  PLT 263  --  [id number]   Basic Metabolic Panel:  Recent Labs Lab 01/30/16 2304 01/30/16 2318 01/31/16 0858  NA 140 143  --   K 3.7 3.7  --   CL 100* 99*  --   CO2 26  --   --   GLUCOSE 233* 236*  --   BUN 21* 23*  --   CREATININE 1.00 0.90 1.03  CALCIUM 10.1  --   --    GFR: Estimated Creatinine Clearance: 74.4 mL/min (by C-G formula based on Cr of 1.03). Liver Function Tests:  Recent Labs  Lab 01/30/16 2304  AST 17  ALT 26  ALKPHOS 57  BILITOT 1.6*  PROT 7.9  ALBUMIN 4.4   No results for input(s): LIPASE, AMYLASE in the last 168 hours. No results for input(s): AMMONIA in the last 168 hours. Coagulation Profile:  Recent Labs Lab 01/30/16 2304  INR 1.09   Cardiac Enzymes: No results for input(s): CKTOTAL, CKMB, CKMBINDEX, TROPONINI in the last 168 hours. BNP (last 3 results) No results for input(s): PROBNP in the last 8760 hours. HbA1C:  Recent Labs  01/31/16 0858  HGBA1C 8.3*   CBG:  Recent Labs Lab 01/31/16 1128 01/31/16 1620 01/31/16 2154 02/01/16 0613 02/01/16 1123   GLUCAP 187* 128* 142* 117* 123*   Lipid Profile:  Recent Labs  01/31/16 0858  CHOL 118  HDL 48  LDLCALC 51  TRIG 96  CHOLHDL 2.5   Thyroid Function Tests: No results for input(s): TSH, T4TOTAL, FREET4, T3FREE, THYROIDAB in the last 72 hours. Anemia Panel: No results for input(s): VITAMINB12, FOLATE, FERRITIN, TIBC, IRON, RETICCTPCT in the last 72 hours. Sepsis Labs: No results for input(s): PROCALCITON, LATICACIDVEN in the last 168 hours.  No results found for this or any previous visit (from the past 240 hour(s)).       Radiology Studies: Dg Chest 2 View  01/31/2016  CLINICAL DATA:  Patient unsteady on feet, acute onset, with falls. Initial encounter. EXAM: CHEST  2 VIEW COMPARISON:  Chest radiograph performed 07/01/2010 FINDINGS: The lungs are well-aerated. Minimal bilateral atelectasis is noted. There is no evidence of pleural effusion or pneumothorax. The heart is normal in size; the mediastinal contour is within normal limits. No acute osseous abnormalities are seen. IMPRESSION: Minimal bilateral atelectasis noted. Lungs otherwise clear. No displaced rib fracture seen. Electronically Signed   By: Garald Balding M.D.   On: 01/31/2016 03:42   Ct Head Wo Contrast  01/30/2016  CLINICAL DATA:  Acute onset of dizziness and headache. Uncontrolled hypertension. Initial encounter. EXAM: CT HEAD WITHOUT CONTRAST TECHNIQUE: Contiguous axial images were obtained from the base of the skull through the vertex without intravenous contrast. COMPARISON:  None. FINDINGS: There is no evidence of acute infarction, mass lesion, or intra- or extra-axial hemorrhage on CT. Prominence of the ventricles and sulci suggests mild cortical volume loss. Scattered periventricular white matter change likely reflects small vessel ischemic microangiopathy. Chronic lacunar infarcts are seen at the basal ganglia bilaterally. The brainstem and fourth ventricle are within normal limits. The cerebral hemispheres  demonstrate grossly normal gray-white differentiation. No mass effect or midline shift is seen. There is no evidence of fracture; visualized osseous structures are unremarkable in appearance. The orbits are within normal limits. The paranasal sinuses and mastoid air cells are well-aerated. No significant soft tissue abnormalities are seen. IMPRESSION: 1. No acute intracranial pathology seen on CT. 2. Mild cortical volume loss and scattered small vessel ischemic microangiopathy. 3. Chronic lacunar infarcts at the basal ganglia bilaterally. Electronically Signed   By: Garald Balding M.D.   On: 01/30/2016 23:45   Mr Brain Wo Contrast  01/31/2016  CLINICAL DATA:  Initial evaluation for history of falls. EXAM: MRI HEAD WITHOUT CONTRAST TECHNIQUE: Multiplanar, multiecho pulse sequences of the brain and surrounding structures were obtained without intravenous contrast. COMPARISON:  Prior CT from. FINDINGS: Mild diffuse prominence of the CSF containing spaces is compatible with generalized cerebral atrophy. Patchy T2/FLAIR hyperintensity within the periventricular white matter most consistent with chronic small vessel ischemic disease, mild in nature. Remote lacunar infarcts present within the bilateral  basal ganglia as well as the thalami. Small remote bilateral cerebellar infarcts. Associated mild chronic hemosiderin staining with a few of these infarcts. No abnormal foci of restricted diffusion to suggest acute infarct. Gray-white matter differentiation maintained. Major intracranial vascular flow voids are preserved. No acute intracranial hemorrhage. No mass lesion, midline shift, or mass effect. No hydrocephalus. No extra-axial fluid collection. Major dural sinuses are grossly patent. Craniocervical junction within normal limits. Visualized upper cervical spine unremarkable. Pituitary gland within normal limits. No definite abnormality about the orbits, although they are incompletely visualized. Paranasal sinuses  grossly clear. Trace opacity within left mastoid air cells. Inner ear structures grossly unremarkable. Bone marrow signal intensity within normal limits. No scalp soft tissue abnormality. IMPRESSION: 1. No acute intracranial infarct or other process identified. 2. Multiple remote lacunar infarcts involving the bilateral basal ganglia, thalami, and left cerebellar hemisphere. 3. Mild atrophy with chronic small vessel ischemic disease. Electronically Signed   By: Jeannine Boga M.D.   On: 01/31/2016 05:32        Scheduled Meds: . aspirin  300 mg Rectal Daily   Or  . aspirin  325 mg Oral Daily  . baclofen  10 mg Oral Q2200  . baclofen  5 mg Oral BID  . clopidogrel  75 mg Oral Daily  . enoxaparin (LOVENOX) injection  40 mg Subcutaneous Daily  . insulin aspart  0-15 Units Subcutaneous TID WC  . insulin aspart  0-5 Units Subcutaneous QHS  . metFORMIN  1,000 mg Oral Q breakfast   Continuous Infusions:   Time spent: > 35 minutes   Velvet Bathe, MD [facility] Pager [phone]  If 7PM-7AM, please contact night-coverage [email or web address] Password [email or web address] 02/01/2016, 3:13 PM

## 2016-02-01 NOTE — Significant Event (Signed)
Rapid Response Event Note Called per floor RN regarding Pt with acute new onset chest pain 6/10. Advised RN to do EKG and page [facility] Provider while RRT en route  Overview: Time Called: 2005 Arrival Time: 2015 Event Type: Cardiac  Initial Focused Assessment: Pt found resting in bed, denies pain at this time. Alert oriented x 4, lung sound clear Heart tones WNL. ABD soft. MAEW x 4  Interventions: EKG completed and compared to prior EKG in epic chart. ST elevation assessed in leads V1, V2, V3, V4,  slight ST depression in lead II. Similar lead changes in prior EKG. [facility] NP paged x 2, call back received per Raliegh Ip Schorr  NP. Updated per floor RN on Patient status. Troponin lab ordered STAT. In house [facility] MD to see Patient tonight, further orders pending bedside assessment. RN advised to monitor Pt closely and notify myself and Provider for worsening changes.   Event Summary: Name of Physician Notified: Lamar Blinks NP [facility] at 2015    at    Outcome: Stayed in room and stabalized  Event End Time: 2040  Joe Perry

## 2016-02-01 NOTE — Progress Notes (Signed)
Patient complaining of left chest pain tightness non radiating  scores 6/10.vital signs taken rapid response notified and oxygen at 2 liters nasal cannula placed .Will obtain ekg and notify MD.

## 2016-02-01 NOTE — Progress Notes (Signed)
Patient now states,"NO more chest pain." Still awaiting response form NP K. Schorr  Will continue to monitor patient.

## 2016-02-01 NOTE — Progress Notes (Signed)
Spoke with Lamar Blinks ,NP.will order some labs and someone to come see patient.Will continue to monitor.

## 2016-02-01 NOTE — Progress Notes (Signed)
Joe Blinks NP text paged patient having chest pain awaiting response.

## 2016-02-01 NOTE — Progress Notes (Signed)
Physical Therapy Treatment Patient Details Name: Joe Perry MRN: [id number] DOB: 12-23-1950 Today's Date: 02/01/2016    History of Present Illness Patient is a 65 y/o male with hx of HTN, DM and CVA presents with gait instability and dizziness. MRI- unremarkable. Admitted for acute TIA.     PT Comments    Patient progressing slowly towards PT goals. Tolerated gait training without use of RW today- utilized handrail in hallway for support and hand held assist as well. Continues to have severe right lateral lean with difficulty maintaining self correction and midline. Ataxia still present requiring Mod A for balance/safety.  MD to start baclofen today. Will continue to follow.  Follow Up Recommendations  CIR     Equipment Recommendations  Other (comment)    Recommendations for Other Services       Precautions / Restrictions Precautions Precautions: Fall Restrictions Weight Bearing Restrictions: No    Mobility  Bed Mobility Overal bed mobility: Needs Assistance Bed Mobility: Supine to Sit     Supine to sit: Modified independent (Device/Increase time);HOB elevated     General bed mobility comments: Use of rail. No dizziness.   Transfers Overall transfer level: Needs assistance Equipment used: None Transfers: Sit to/from Stand Sit to Stand: Min assist;Min guard         General transfer comment: Min A to steady in standing with pt reaching for UE support. Stood from chair x3. Progressed to Min guard assist but unsteady in standing each time. Cues to slow down.  Ambulation/Gait Ambulation/Gait assistance: Mod assist Ambulation Distance (Feet): 100 Feet (x3 bouts) Assistive device: 1 person hand held assist (rail in hallway) Gait Pattern/deviations: Ataxic;Staggering right;Staggering left;Decreased weight shift to left;Step-through pattern     General Gait Details: pt with ataxic gait pattern with right lateral lean. Not aware of leaning to the right, able to  self correct with manual and verbal cues. Holding rail for first 2 bouts and HHA for 3rd bout. Cues to decrease gait speed.   Stairs            Wheelchair Mobility    Modified Rankin (Stroke Patients Only) Modified Rankin (Stroke Patients Only) Pre-Morbid Rankin Score: No significant disability Modified Rankin: Moderately severe disability     Balance Overall balance assessment: Needs assistance Sitting-balance support: Feet supported;No upper extremity supported Sitting balance-[name]: Good     Standing balance support: During functional activity;Single extremity supported Standing balance-[name]: Fair Standing balance comment: unsteady in standing.                    Cognition Arousal/Alertness: Awake/alert Behavior During Therapy: WFL for tasks assessed/performed Overall Cognitive Status: Within Functional Limits for tasks assessed                      Exercises      General Comments General comments (skin integrity, edema, etc.): Difficult to assess for increased tone/spasticity as pt resisting me at times and not able to relax.      Pertinent Vitals/Pain Pain Assessment: No/denies pain    Home Living                      Prior Function            PT Goals (current goals can now be found in the care plan section) Progress towards PT goals: Progressing toward goals    Frequency  Min 4X/week    PT Plan Current plan remains appropriate  Co-evaluation             End of Session Equipment Utilized During Treatment: Gait belt Activity Tolerance: Patient tolerated treatment well Patient left: in chair;with call bell/phone within reach     Time: 0935-1000 PT Time Calculation (min) (ACUTE ONLY): 25 min  Charges:  $Gait Training: 23-37 mins                    G Codes:      Ginnie Marich A Estefanie Cornforth 02/01/2016, 10:16 AM Wray Kearns, [place], DPT [phone]

## 2016-02-02 DIAGNOSIS — R269 Unspecified abnormalities of gait and mobility: Secondary | ICD-10-CM | POA: Insufficient documentation

## 2016-02-02 DIAGNOSIS — I69398 Other sequelae of cerebral infarction: Secondary | ICD-10-CM | POA: Insufficient documentation

## 2016-02-02 DIAGNOSIS — I63219 Cerebral infarction due to unspecified occlusion or stenosis of unspecified vertebral arteries: Secondary | ICD-10-CM | POA: Insufficient documentation

## 2016-02-02 DIAGNOSIS — R252 Cramp and spasm: Secondary | ICD-10-CM | POA: Insufficient documentation

## 2016-02-02 LAB — HEMOGLOBIN A1C
Hgb A1c MFr Bld: 8.2 % — ABNORMAL HIGH (ref 4.8–5.6)
Mean Plasma Glucose: 189 mg/dL

## 2016-02-02 LAB — GLUCOSE, CAPILLARY
GLUCOSE-CAPILLARY: 152 mg/dL — AB (ref 65–99)
GLUCOSE-CAPILLARY: 108 mg/dL — AB (ref 65–99)
Glucose-Capillary: 116 mg/dL — ABNORMAL HIGH (ref 65–99)

## 2016-02-02 MED ORDER — SENNOSIDES-DOCUSATE SODIUM 8.6-50 MG PO TABS: 1.0000 | ORAL_TABLET | Freq: Every evening | ORAL | Status: DC | PRN

## 2016-02-02 MED ORDER — BACLOFEN 10 MG PO TABS: ORAL_TABLET | ORAL | Status: DC

## 2016-02-02 NOTE — Progress Notes (Signed)
Follow-up: Notified by RN at approx 2030 regarding pt c/o CP that he has been having on and off all day. A 12-lead EKG was obtained but unfortunately it did not cross over into EPIC. RR RN was paged and responded to bedside. During my f/u call RN reported that pt then stated CP had resolved. Troponin added. Spoke w/ my colleague, in-house MD,  Dr Hal Hope who agreed to f/u on pt and review EKG. After his visit he did report that EKG was w/o acute changes. Troponin noted to be negative. Pt has remained CP free at the time of this note. Will continue to monitor closely on telemetry.   Jeryl Columbia, NP-C [facility] Pager [phone]

## 2016-02-02 NOTE — Discharge Summary (Signed)
Physician Discharge Summary  Joe Perry [id number] DOB: 1950-12-12 DOA: 01/31/2016  PCP: No PCP Per Patient  Admit date: 01/31/2016 Discharge date: 02/02/2016  Time spent: 35 minutes  Recommendations for Outpatient Follow-up:  Reassess BP and adjust medications as needed Repeat BMET to follow electrolyte sand renal function   Discharge Diagnoses:  Active Problems:   CVA (cerebral infarction)   Type 2 diabetes mellitus without complication, without long-term current use of insulin ([name])   Essential hypertension   History of CVA (cerebrovascular accident)   Ataxia   Abnormality of gait   Spasticity   Gait disturbance, post-stroke   Discharge Condition: stable and improved. Discharge home with instructions for outpatient rehab and outpatient follow up with neurology service.  Diet recommendation: heart healthy and low carbohydrates diet   Filed Weights   01/31/16 0613  Weight: 84.369 kg (186 lb)    History of present illness:  As per H&P written by [name] on 01/31/16 65 y.o. male with a past medical history significant for hx of CVA on Plavix, NIDDM, and HTN who presents with ataxia for one day. The patient was in his usual state of health until today when he woke up unable to walk. He describes having to hold onto the wall while walking to the bathroom, losing his balance and falling down while bending over, and generally having the sensation of falling to the right and falling over unless he was holding onto something. There is no vertigo or sensation of room movement. He did vomit. He had no syncope, no focal weakness, no numbness, no speech disturbance.  In the ED, he was afebrile, hemodynamically stable, slightly hypertensive. The electrolytes and renal function were normal. The total bilirubin was minimally elevated. The coags were normal, WBC and platelets normal. No anemia. Troponin negative. CT head with mild chronic small vessel disease. CXR clear. ECG  with sinus tachycardia, LVH and lateral TWI without previous for comparison.   Hospital Course:  Hx of CVA (cerebral infarction)/Ataxia -Stroke workup has been negative. MRI reports no acute intracranial infarct -2-D echo w/o acute emboli -per neurology rec's, will continue aspirin and plavix -ataxia/spascity as residual progressive effect from previous stroke -continue treatment with baclofen as recommended -at discharge will be follow by neurology for further adjustment to baclofen therapy and have further evaluation -no CIR needed and was recommended outpatient rehab  -continue risk factors modifications   Dizziness -neg orthostatic vital signs -resolved with fluid resuscitation  Type 2 diabetes mellitus without complication, without long-term current use of insulin ([name]) -Will continue oral hypoglycemic regimen  -advise to follow low carbohydrates diet   Essential hypertension -Relatively well controlled -continue home antihypertensive regimen and heart healthy diet    Procedures:  See below for x-ray reports   2-D echo Study Conclusions  - Left ventricle: The cavity size was normal. Wall thickness was normal. Systolic function was normal. The estimated ejection fraction was in the range of 55% to 60%. Wall motion wasgrossly normal, however, there is a mild inferoseptal logitudinal strain abnormality. Doppler parameters are consistent with abnormal left ventricular relaxation (grade 1 diastolic dysfunction). The E/e' ratio is ~10, suggesting borderline elevated LV filling pressure. - Left atrium: Moderately dilated (24 cm2). - Right ventricle: Prominent moderator band. Systolic function was normal. - Right atrium: The atrium was mildly dilated (18 cm2). - Atrial septum: No defect or patent foramen ovale was identified. - Tricuspid valve: Dilated annulus at 4.24 cm. Poorly visualized. No significant regurgitation. - Inferior vena cava:  The  vessel was normal in size; the respirophasic diameter changes were in the normal range (= 50%); findings are consistent with normal central venous pressure.  Consultations:  Neurology   Discharge Exam: Filed Vitals:   02/02/16 0915 02/02/16 1408  BP: 144/91 126/81  Pulse: 90 80  Temp: 98.2 F (36.8 C) 98.1 F (36.7 C)  Resp: 20 20    General: afebrile, less spasticity appreciated at discharge; no CP, no SOB and no other acute complaints.  Cardiovascular: S1 and S2, no rubs or gallops Respiratory: CTA bilaterally, no need for oxygen supplementation   Discharge Instructions   Discharge Instructions    Ambulatory referral to Physical Medicine Rehab    Complete by:  As directed   Moderate complex city follow-up 1-2 weeks post CVA     Diet - low sodium heart healthy    Complete by:  As directed      Discharge instructions    Complete by:  As directed   Follow heart healthy and low carb diet Take medications as prescribed Follow up with neurology service as an outpatient (for further evaluation and adjustment on baclofen dosage) Maintain yourself well hydrated  Follow up with arranged appointment to establish care with PCP on 02/22/16          Current Discharge Medication List    START taking these medications   Details  baclofen (LIORESAL) 10 MG tablet Take 5mg  (1/2 a tablet) twice a day and then 10mg  (a whole tablet at bedtime) Qty: 60 each, Refills: 0    senna-docusate (SENOKOT-S) 8.6-50 MG tablet Take 1 tablet by mouth at bedtime as needed for moderate constipation. Qty: 30 tablet, Refills: 0      CONTINUE these medications which have NOT CHANGED   Details  aspirin EC 81 MG tablet Take 1 tablet (81 mg total) by mouth daily. Qty: 30 tablet, Refills: 3    clopidogrel (PLAVIX) 75 MG tablet Take 75 mg by mouth daily.    hydrochlorothiazide (MICROZIDE) 12.5 MG capsule Take 12.5 mg by mouth daily.    metFORMIN (GLUCOPHAGE) 1000 MG tablet Take 1 tablet  (1,000 mg total) by mouth 2 (two) times daily with a meal. Qty: 180 tablet, Refills: 3       No Known Allergies Follow-up Information    Follow up with [facility] On 02/22/2016.   [id number]. Appointment time is 9:30. Please arrive 15 min early and bring a picture ID and your current medications. May use the [facility] pharmacy at discharge for assistance with meds.   Contact information:   [place] [phone]       Follow up with [facility].   Why:  call office to set up follow up appointment in 2 weeks or so   Contact information:   [address]     [address] [place] [phone]      The results of significant diagnostics from this hospitalization (including imaging, microbiology, ancillary and laboratory) are listed below for reference.    Significant Diagnostic Studies: Dg Chest 2 View  01/31/2016  CLINICAL DATA:  Patient unsteady on feet, acute onset, with falls. Initial encounter. EXAM: CHEST  2 VIEW COMPARISON:  Chest radiograph performed 07/01/2010 FINDINGS: The lungs are well-aerated. Minimal bilateral atelectasis is noted. There is no evidence of pleural effusion or pneumothorax. The heart is normal in size; the mediastinal contour is within normal limits. No acute osseous abnormalities are  seen. IMPRESSION: Minimal bilateral atelectasis noted. Lungs otherwise clear. No displaced rib fracture seen. Electronically Signed   By: Garald Balding M.D.   On: 01/31/2016 03:42   Ct Head Wo Contrast  01/30/2016  CLINICAL DATA:  Acute onset of dizziness and headache. Uncontrolled hypertension. Initial encounter. EXAM: CT HEAD WITHOUT CONTRAST TECHNIQUE: Contiguous axial images were obtained from the base of the skull through the vertex without intravenous contrast. COMPARISON:  None. FINDINGS: There is no evidence of acute infarction, mass lesion, or intra- or extra-axial hemorrhage  on CT. Prominence of the ventricles and sulci suggests mild cortical volume loss. Scattered periventricular white matter change likely reflects small vessel ischemic microangiopathy. Chronic lacunar infarcts are seen at the basal ganglia bilaterally. The brainstem and fourth ventricle are within normal limits. The cerebral hemispheres demonstrate grossly normal gray-white differentiation. No mass effect or midline shift is seen. There is no evidence of fracture; visualized osseous structures are unremarkable in appearance. The orbits are within normal limits. The paranasal sinuses and mastoid air cells are well-aerated. No significant soft tissue abnormalities are seen. IMPRESSION: 1. No acute intracranial pathology seen on CT. 2. Mild cortical volume loss and scattered small vessel ischemic microangiopathy. 3. Chronic lacunar infarcts at the basal ganglia bilaterally. Electronically Signed   By: Garald Balding M.D.   On: 01/30/2016 23:45   Mr Brain Wo Contrast  01/31/2016  CLINICAL DATA:  Initial evaluation for history of falls. EXAM: MRI HEAD WITHOUT CONTRAST TECHNIQUE: Multiplanar, multiecho pulse sequences of the brain and surrounding structures were obtained without intravenous contrast. COMPARISON:  Prior CT from. FINDINGS: Mild diffuse prominence of the CSF containing spaces is compatible with generalized cerebral atrophy. Patchy T2/FLAIR hyperintensity within the periventricular white matter most consistent with chronic small vessel ischemic disease, mild in nature. Remote lacunar infarcts present within the bilateral basal ganglia as well as the thalami. Small remote bilateral cerebellar infarcts. Associated mild chronic hemosiderin staining with a few of these infarcts. No abnormal foci of restricted diffusion to suggest acute infarct. Gray-white matter differentiation maintained. Major intracranial vascular flow voids are preserved. No acute intracranial hemorrhage. No mass lesion, midline shift, or  mass effect. No hydrocephalus. No extra-axial fluid collection. Major dural sinuses are grossly patent. Craniocervical junction within normal limits. Visualized upper cervical spine unremarkable. Pituitary gland within normal limits. No definite abnormality about the orbits, although they are incompletely visualized. Paranasal sinuses grossly clear. Trace opacity within left mastoid air cells. Inner ear structures grossly unremarkable. Bone marrow signal intensity within normal limits. No scalp soft tissue abnormality. IMPRESSION: 1. No acute intracranial infarct or other process identified. 2. Multiple remote lacunar infarcts involving the bilateral basal ganglia, thalami, and left cerebellar hemisphere. 3. Mild atrophy with chronic small vessel ischemic disease. Electronically Signed   By: Jeannine Boga M.D.   On: 01/31/2016 05:32   Labs: Basic Metabolic Panel:  Recent Labs Lab 01/30/16 2304 01/30/16 2318 01/31/16 0858  NA 140 143  --   K 3.7 3.7  --   CL 100* 99*  --   CO2 26  --   --   GLUCOSE 233* 236*  --   BUN 21* 23*  --   CREATININE 1.00 0.90 1.03  CALCIUM 10.1  --   --    Liver Function Tests:  Recent Labs Lab 01/30/16 2304  AST 17  ALT 26  ALKPHOS 57  BILITOT 1.6*  PROT 7.9  ALBUMIN 4.4   CBC:  Recent Labs Lab 01/30/16 2304 01/30/16 2318 01/31/16  0858  WBC 9.0  --  10.8*  NEUTROABS 6.6  --   --   HGB 14.6 16.0 14.7  HCT 44.8 47.0 45.4  MCV 92.6  --  94.8  PLT 263  --  241   Cardiac Enzymes:  Recent Labs Lab 02/01/16 2059  TROPONINI <0.03   CBG:  Recent Labs Lab 02/01/16 1123 02/01/16 1617 02/01/16 2157 02/02/16 0557 02/02/16 1146  GLUCAP 123* 111* 140* 116* 152*    Signed:  Barton Dubois MD.  [facility] 02/02/2016, 3:58 PM

## 2016-02-02 NOTE — Progress Notes (Signed)
Discharge orders received.  Discharge instructions and follow-up appointments reviewed with the patient.  VSS upon discharge.  IV removed and education complete.  All belongings sent with the patient.  Transported out via wheelchair. Nilani Hugill M, RN   

## 2016-02-02 NOTE — Progress Notes (Signed)
Physical Therapy Treatment Patient Details Name: Joe Perry MRN: [id number] DOB: 1951/03/15 Today's Date: 02/02/2016    History of Present Illness Patient is a 65 y/o male with hx of HTN, DM and CVA presents with gait instability and dizziness. MRI- unremarkable. Admitted for acute TIA.     PT Comments    Patient's balance improved from yesterday's session. Tolerated gait training with RW for support/safety. Continues to have mild ataxia but able to correct for LOB and right lateral lean better today. Still with 1-2 LOB. Performed stair training with min guard for safety. Pt eager to go home. Encouraged use of RW for support. Recommend supervision at home. Discharge recommendation updated to HHPT for home safety evaluation and to improve higher level balance.  Follow Up Recommendations  Home health PT;Supervision for mobility/OOB     Equipment Recommendations  None recommended by PT    Recommendations for Other Services       Precautions / Restrictions Precautions Precautions: Fall Restrictions Weight Bearing Restrictions: No    Mobility  Bed Mobility               General bed mobility comments: up in chair.  Transfers Overall transfer level: Needs assistance Equipment used: Rolling walker (2 wheeled) Transfers: Sit to/from Stand Sit to Stand: Min guard         General transfer comment: Min guard for safety. Mild unsteadiness upon standing but no overt LOB. Stood from Automotive engineer.   Ambulation/Gait Ambulation/Gait assistance: Min guard;Min assist Ambulation Distance (Feet): 200 Feet Assistive device: Rolling walker (2 wheeled) Gait Pattern/deviations: Step-through pattern;Decreased stride length;Ataxic Gait velocity: decreased   General Gait Details: Continues to have mild ataxia with right lateral lean at times. Able to self correct when using RW. unsteady but only 1-2 LOB during gait requiring min A.   Stairs Stairs: Yes Stairs assistance: Min  guard Stair Management: Step to pattern;Alternating pattern;Two rails;One rail Left Number of Stairs: 13 General stair comments: Cues for technique and safety. Used Bil hand rails to ascend and 1 handrail to descend. Despite cues to perform step to pattern, pt progressing to alternating step pattern.  Wheelchair Mobility    Modified Rankin (Stroke Patients Only) Modified Rankin (Stroke Patients Only) Pre-Morbid Rankin Score: No significant disability Modified Rankin: Moderately severe disability     Balance Overall balance assessment: Needs assistance Sitting-balance support: Feet supported;No upper extremity supported Sitting balance-[name]: Good     Standing balance support: During functional activity Standing balance-[name]: Fair                      Cognition Arousal/Alertness: Awake/alert Behavior During Therapy: WFL for tasks assessed/performed Overall Cognitive Status: Within Functional Limits for tasks assessed                      Exercises      General Comments        Pertinent Vitals/Pain Pain Assessment: No/denies pain    Home Living                      Prior Function            PT Goals (current goals can now be found in the care plan section) Progress towards PT goals: Progressing toward goals    Frequency  Min 4X/week    PT Plan Discharge plan needs to be updated    Co-evaluation  End of Session Equipment Utilized During Treatment: Gait belt Activity Tolerance: Patient tolerated treatment well Patient left: in chair;with call bell/phone within reach;with chair alarm set     Time: [id number] PT Time Calculation (min) (ACUTE ONLY): 14 min  Charges:  $Gait Training: 8-22 mins                    G Codes:      Alois Mincer A Elimelech Houseman 02/02/2016, 11:00 AM Wray Kearns, [place], DPT [phone]

## 2016-02-02 NOTE — Care Management Note (Signed)
Case Management Note  Patient Details  Name: Joe Perry MRN: [id number] Date of Birth: October 27, 1950  Subjective/Objective:                    Action/Plan: Pt discharging to home with rec from PT for [facility] services. Pt is without insurance and his diagnosis does not qualify him for charity HHPT. Pt also ambulating with walker in the hospital. Pt states he has a walker at home. CM reminded him to use pharmacy at [facility] at discharge. Pt also has a f/u appointment with [facility] that is on the AVS. Bedside RN updated.  Expected Discharge Date:                  Expected Discharge Plan:  [place]  In-House Referral:     Discharge planning Services  CM Consult  Post Acute Care Choice:    Choice offered to:     DME Arranged:    DME Agency:     HH Arranged:    [place] Agency:     Status of Service:  Completed, signed off  Medicare Important Message Given:    Date Medicare IM Given:    Medicare IM give by:    Date Additional Medicare IM Given:    Additional Medicare Important Message give by:     If discussed at [place] of Stay Meetings, dates discussed:    Additional Comments:  Pollie Friar, RN 02/02/2016, 4:11 PM

## 2016-02-02 NOTE — Progress Notes (Signed)
Pt not in need of an inpt rehab admission at this time. LIkely progress to [facility]. I have notified RN [place]. [phone]

## 2016-02-22 ENCOUNTER — Ambulatory Visit: Payer: Self-pay | Admitting: Family Medicine

## 2018-11-13 ENCOUNTER — Emergency Department (HOSPITAL_COMMUNITY): Payer: Self-pay

## 2018-11-13 ENCOUNTER — Emergency Department (HOSPITAL_COMMUNITY)
Admission: EM | Admit: 2018-11-13 | Discharge: 2018-11-13 | Discharge status: Against Medical Advice | Payer: Self-pay | Attending: Emergency Medicine | Admitting: Emergency Medicine

## 2018-11-13 ENCOUNTER — Encounter (HOSPITAL_COMMUNITY): Payer: Self-pay | Admitting: Emergency Medicine

## 2018-11-13 ENCOUNTER — Other Ambulatory Visit: Payer: Self-pay

## 2018-11-13 DIAGNOSIS — R29810 Facial weakness: Secondary | ICD-10-CM | POA: Insufficient documentation

## 2018-11-13 DIAGNOSIS — I639 Cerebral infarction, unspecified: Secondary | ICD-10-CM

## 2018-11-13 DIAGNOSIS — R4182 Altered mental status, unspecified: Secondary | ICD-10-CM | POA: Insufficient documentation

## 2018-11-13 DIAGNOSIS — R55 Syncope and collapse: Secondary | ICD-10-CM | POA: Insufficient documentation

## 2018-11-13 DIAGNOSIS — R464 Slowness and poor responsiveness: Secondary | ICD-10-CM | POA: Insufficient documentation

## 2018-11-13 DIAGNOSIS — I10 Essential (primary) hypertension: Secondary | ICD-10-CM | POA: Insufficient documentation

## 2018-11-13 DIAGNOSIS — R32 Unspecified urinary incontinence: Secondary | ICD-10-CM | POA: Insufficient documentation

## 2018-11-13 DIAGNOSIS — E119 Type 2 diabetes mellitus without complications: Secondary | ICD-10-CM | POA: Insufficient documentation

## 2018-11-13 DIAGNOSIS — R404 Transient alteration of awareness: Secondary | ICD-10-CM

## 2018-11-13 LAB — DIFFERENTIAL
Abs Immature Granulocytes: 0.05 10*3/uL (ref 0.00–0.07)
Basophils Absolute: 0 10*3/uL (ref 0.0–0.1)
Basophils Relative: 0 %
Eosinophils Absolute: 0 10*3/uL (ref 0.0–0.5)
Eosinophils Relative: 0 %
Immature Granulocytes: 1 %
Lymphocytes Relative: 6 %
Lymphs Abs: 0.6 10*3/uL — ABNORMAL LOW (ref 0.7–4.0)
Monocytes Absolute: 0.7 10*3/uL (ref 0.1–1.0)
Monocytes Relative: 8 %
NEUTROS ABS: 8 10*3/uL — AB (ref 1.7–7.7)
Neutrophils Relative %: 85 %

## 2018-11-13 LAB — COMPREHENSIVE METABOLIC PANEL
ALBUMIN: 3.7 g/dL (ref 3.5–5.0)
ALT: 22 U/L (ref 0–44)
AST: 20 U/L (ref 15–41)
Alkaline Phosphatase: 62 U/L (ref 38–126)
Anion gap: 11 (ref 5–15)
BUN: 14 mg/dL (ref 8–23)
CHLORIDE: 101 mmol/L (ref 98–111)
CO2: 26 mmol/L (ref 22–32)
Calcium: 9.2 mg/dL (ref 8.9–10.3)
Creatinine, Ser: 1.01 mg/dL (ref 0.61–1.24)
GFR calc Af Amer: >60 mL/min (ref >60)
GFR calc non Af Amer: >60 mL/min (ref >60)
Glucose, Bld: 321 mg/dL — ABNORMAL HIGH (ref 70–99)
Potassium: 3.9 mmol/L (ref 3.5–5.1)
Sodium: 138 mmol/L (ref 135–145)
Total Bilirubin: 1.2 mg/dL (ref 0.3–1.2)
Total Protein: 6.9 g/dL (ref 6.5–8.1)

## 2018-11-13 LAB — CBC
HCT: 42.1 % (ref 39.0–52.0)
HEMOGLOBIN: 13.6 g/dL (ref 13.0–17.0)
MCH: 29.6 pg (ref 26.0–34.0)
MCHC: 32.3 g/dL (ref 30.0–36.0)
MCV: 91.5 fL (ref 80.0–100.0)
Platelets: 189 10*3/uL (ref 150–400)
RBC: 4.6 MIL/uL (ref 4.22–5.81)
RDW: 12.4 % (ref 11.5–15.5)
WBC: 9.4 10*3/uL (ref 4.0–10.5)
nRBC: 0 % (ref 0.0–0.2)

## 2018-11-13 LAB — URINALYSIS, ROUTINE W REFLEX MICROSCOPIC
Bacteria, UA: NONE SEEN
Bilirubin Urine: NEGATIVE
Glucose, UA: >=500 mg/dL — AB
Hgb urine dipstick: NEGATIVE
KETONES UR: NEGATIVE mg/dL
Leukocytes,Ua: NEGATIVE
Nitrite: NEGATIVE
Protein, ur: NEGATIVE mg/dL
Specific Gravity, Urine: 1.033 — ABNORMAL HIGH (ref 1.005–1.030)
pH: 5 (ref 5.0–8.0)

## 2018-11-13 LAB — RAPID URINE DRUG SCREEN, HOSP PERFORMED
Amphetamines: NOT DETECTED
Barbiturates: NOT DETECTED
Benzodiazepines: NOT DETECTED
Cocaine: NOT DETECTED
Opiates: NOT DETECTED
Tetrahydrocannabinol: NOT DETECTED

## 2018-11-13 LAB — I-STAT CREATININE, ED: CREATININE: 0.8 mg/dL (ref 0.61–1.24)

## 2018-11-13 LAB — PROTIME-INR
INR: 1 (ref 0.8–1.2)
Prothrombin Time: 13.6 seconds (ref 11.4–15.2)

## 2018-11-13 LAB — CBG MONITORING, ED: Glucose-Capillary: 288 mg/dL — ABNORMAL HIGH (ref 70–99)

## 2018-11-13 LAB — APTT: aPTT: 23 seconds — ABNORMAL LOW (ref 24–36)

## 2018-11-13 MED ORDER — IOHEXOL 350 MG/ML SOLN
75.0000 mL | Freq: Once | INTRAVENOUS | Status: AC | PRN
  Administered 2018-11-13: 75 mL via INTRAVENOUS

## 2018-11-13 MED ORDER — CLOPIDOGREL BISULFATE 75 MG PO TABS: 75.0000 mg | ORAL_TABLET | Freq: Every day | ORAL | 0 refills | Status: DC

## 2018-11-13 NOTE — ED Provider Notes (Signed)
[facility] EMERGENCY DEPARTMENT Provider Note   CSN: [id number] Arrival date & time: 11/13/18  1148  An emergency department physician performed an initial assessment on this suspected stroke patient at 1148(Peggyann Zwiefelhofer MD ).  History   Chief Complaint No chief complaint on file.   HPI Joe Perry is a 68 y.o. male.     Patient is a 68 year old male with past medical history of diabetes, hypertension, and prior CVA.  He presents for evaluation of right-sided facial droop.  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  He was said to have had a facial droop along with urinary incontinence.  He was slow to respond to his coworkers who in turn called EMS who transported the patient here for evaluation.  Per EMS, he had a facial droop initially upon their arrival, however this resolved in route, then seems to have recurred here.  Patient tells me that he has had his baseline and feels fine.  He has no complaints at present.  The history is provided by the patient.    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 No pertinent surgical history.      Home Medications    Prior to Admission medications   Medication Sig Start Date End Date Taking? Authorizing Provider  aspirin EC 81 MG tablet Take 1 tablet (81 mg total) by mouth daily. Patient not  taking: Reported on 11/13/2018 09/08/13   Reyne Dumas, MD  baclofen (LIORESAL) 10 MG tablet Take 5mg  (1/2 a tablet) twice a day and then 10mg  (a whole tablet at bedtime) Patient not taking: Reported on 11/13/2018 02/02/16   Barton Dubois, MD  metFORMIN (GLUCOPHAGE) 1000 MG tablet Take 1 tablet (1,000 mg total) by mouth 2 (two) times daily with a meal. Patient not taking: Reported on 11/13/2018 09/10/13   Reyne Dumas, MD    Family History Family History  Problem Relation Age of Onset   Diabetes Unknown    Hypertension Unknown     Social History Social History   Tobacco Use   Smoking status: Never Smoker  Substance Use Topics   Alcohol use: No   Drug use: No     Allergies   Patient has no known allergies.   Review of Systems Review of Systems  All other systems reviewed and are negative.    Physical Exam Updated Vital Signs BP (!) 148/84    Pulse (!) 104    Resp (!) 21    Ht 5\' 9"  (1.753 m)    Wt 90.2 kg    SpO2 100%    BMI 29.37 kg/m   Physical Exam Vitals signs and nursing note reviewed.  Constitutional:      General: He is not in acute distress.    Appearance: He is well-developed. He is not diaphoretic.  HENT:     Head: Normocephalic and  atraumatic.  Neck:     Musculoskeletal: Normal range of motion and neck supple.  Cardiovascular:     Rate and Rhythm: Normal rate and regular rhythm.     Heart sounds: No murmur. No friction rub.  Pulmonary:     Effort: Pulmonary effort is normal. No respiratory distress.     Breath sounds: Normal breath sounds. No wheezing or rales.  Abdominal:     General: Bowel sounds are normal. There is no distension.     Palpations: Abdomen is soft.     Tenderness: There is no abdominal tenderness.  Musculoskeletal: Normal range of motion.  Skin:    General: Skin is warm and dry.  Neurological:     Mental Status: He is alert and oriented to person, place, and time.     Sensory: No sensory deficit.     Motor: No weakness.      Coordination: Coordination normal.     Comments: There is facial asymmetry noted.  He does appear to have a slight right-sided facial droop when smiling.      ED Treatments / Results  Labs (all labs ordered are listed, but only abnormal results are displayed) Labs Reviewed  APTT - Abnormal; Notable for the following components:      Result Value   aPTT 23 (*)    All other components within normal limits  DIFFERENTIAL - Abnormal; Notable for the following components:   Neutro Abs 8.0 (*)    Lymphs Abs 0.6 (*)    All other components within normal limits  COMPREHENSIVE METABOLIC PANEL - Abnormal; Notable for the following components:   Glucose, Bld 321 (*)    All other components within normal limits  URINALYSIS, ROUTINE W REFLEX MICROSCOPIC - Abnormal; Notable for the following components:   Specific Gravity, Urine 1.033 (*)    Glucose, UA >=500 (*)    All other components within normal limits  CBG MONITORING, ED - Abnormal; Notable for the following components:   Glucose-Capillary 288 (*)    All other components within normal limits  PROTIME-INR  CBC  RAPID URINE DRUG SCREEN, HOSP PERFORMED  ETHANOL  I-STAT CREATININE, ED    EKG EKG Interpretation  Date/Time:  Wednesday November 13 2018 12:21:54 EDT Ventricular Rate:  109 PR Interval:    QRS Duration: 110 QT Interval:  322 QTC Calculation: 434 R Axis:   35 Text Interpretation:  Sinus tachycardia Biatrial enlargement LVH with secondary repolarization abnormality Confirmed by Veryl Speak [id number]) on 11/13/2018 12:35:51 PM   Radiology Ct Angio Head W Or Wo Contrast  Result Date: 11/13/2018 CLINICAL DATA:  RIGHT facial droop, LEFT arm weakness, possible posterior circulation insult. EXAM: CT ANGIOGRAPHY HEAD AND NECK TECHNIQUE: Multidetector CT imaging of the head and neck was performed using the standard protocol during bolus administration of intravenous contrast. Multiplanar CT image reconstructions and MIPs were  obtained to evaluate the vascular anatomy. Carotid stenosis measurements (when applicable) are obtained utilizing NASCET criteria, using the distal internal carotid diameter as the denominator. CONTRAST:  30mL OMNIPAQUE IOHEXOL 350 MG/ML SOLN COMPARISON:  CT head earlier today. FINDINGS: CTA NECK FINDINGS Aortic arch: Standard branching. Imaged portion shows no evidence of aneurysm or dissection. No significant stenosis of the major arch vessel origins. Right carotid system: No significant common carotid lesion. 50% stenosis of the RIGHT internal carotid artery, soft plaque resulting in luminal narrowing 1.5 cm above the bifurcation, luminal narrowing 2.0/4.0 proximal/distal correspond to a 50% stenosis. No dissection. Irregularity of the BILATERAL M3  MCA branches, but no definite branch occlusion. Left carotid system: No evidence of dissection, stenosis (50% or greater) or occlusion. Vertebral arteries: 75-90% ostial stenosis LEFT vertebral, partially calcified. Suspected 50% stenosis non dominant RIGHT vertebral. No significant narrowing along the V2 or V3 segments. Skeleton: No worrisome osseous lesion. Other neck: No neck masses. Upper chest: Unremarkable. Review of the MIP images confirms the above findings CTA HEAD FINDINGS Anterior circulation: Calcification of the cavernous internal carotid arteries consistent with cerebrovascular atherosclerotic disease. No signs of intracranial large vessel occlusion. BILATERAL fetal PCA. Less than 50% RIGHT M1 MCA stenosis. Posterior circulation: Hypoplastic basilar is supplied primarily by the LEFT vertebral. There are proximal and distal segmental basilar stenoses estimated 50-75%. RIGHT vertebral contributes only to PICA. BILATERAL AICA vessels poorly visualized. 50-75% stenoses BILATERAL superior cerebellar arteries. Venous sinuses: As permitted by contrast timing, patent. Anatomic variants: BILATERAL fetal PCA. RIGHT vertebral terminates in PICA. Delayed phase: Not  performed. Review of the MIP images confirms the above findings IMPRESSION: 1. 50% stenosis of the RIGHT ICA. Nonstenotic atherosclerotic change LEFT ICA. No dissection. 2. No intracranial large vessel occlusion. 3. BILATERAL fetal PCA, with non dominant RIGHT vertebral supplies only PICA. 4. 75-90% ostial stenosis LEFT vertebral. 5. Hypoplastic basilar, with segmental proximal and distal stenoses estimated 50-75%. 6. These results were called by telephone at the time of interpretation on 11/13/2018 at 12:30 Pm to Dr. Leonel Ramsay , who verbally acknowledged these results. Electronically Signed   By: Staci Righter M.D.   On: 11/13/2018 13:13   Ct Angio Neck W Or Wo Contrast  Result Date: 11/13/2018 CLINICAL DATA:  RIGHT facial droop, LEFT arm weakness, possible posterior circulation insult. EXAM: CT ANGIOGRAPHY HEAD AND NECK TECHNIQUE: Multidetector CT imaging of the head and neck was performed using the standard protocol during bolus administration of intravenous contrast. Multiplanar CT image reconstructions and MIPs were obtained to evaluate the vascular anatomy. Carotid stenosis measurements (when applicable) are obtained utilizing NASCET criteria, using the distal internal carotid diameter as the denominator. CONTRAST:  41mL OMNIPAQUE IOHEXOL 350 MG/ML SOLN COMPARISON:  CT head earlier today. FINDINGS: CTA NECK FINDINGS Aortic arch: Standard branching. Imaged portion shows no evidence of aneurysm or dissection. No significant stenosis of the major arch vessel origins. Right carotid system: No significant common carotid lesion. 50% stenosis of the RIGHT internal carotid artery, soft plaque resulting in luminal narrowing 1.5 cm above the bifurcation, luminal narrowing 2.0/4.0 proximal/distal correspond to a 50% stenosis. No dissection. Irregularity of the BILATERAL M3 MCA branches, but no definite branch occlusion. Left carotid system: No evidence of dissection, stenosis (50% or greater) or occlusion. Vertebral  arteries: 75-90% ostial stenosis LEFT vertebral, partially calcified. Suspected 50% stenosis non dominant RIGHT vertebral. No significant narrowing along the V2 or V3 segments. Skeleton: No worrisome osseous lesion. Other neck: No neck masses. Upper chest: Unremarkable. Review of the MIP images confirms the above findings CTA HEAD FINDINGS Anterior circulation: Calcification of the cavernous internal carotid arteries consistent with cerebrovascular atherosclerotic disease. No signs of intracranial large vessel occlusion. BILATERAL fetal PCA. Less than 50% RIGHT M1 MCA stenosis. Posterior circulation: Hypoplastic basilar is supplied primarily by the LEFT vertebral. There are proximal and distal segmental basilar stenoses estimated 50-75%. RIGHT vertebral contributes only to PICA. BILATERAL AICA vessels poorly visualized. 50-75% stenoses BILATERAL superior cerebellar arteries. Venous sinuses: As permitted by contrast timing, patent. Anatomic variants: BILATERAL fetal PCA. RIGHT vertebral terminates in PICA. Delayed phase: Not performed. Review of the MIP images confirms the above findings  IMPRESSION: 1. 50% stenosis of the RIGHT ICA. Nonstenotic atherosclerotic change LEFT ICA. No dissection. 2. No intracranial large vessel occlusion. 3. BILATERAL fetal PCA, with non dominant RIGHT vertebral supplies only PICA. 4. 75-90% ostial stenosis LEFT vertebral. 5. Hypoplastic basilar, with segmental proximal and distal stenoses estimated 50-75%. 6. These results were called by telephone at the time of interpretation on 11/13/2018 at 12:30 Pm to Dr. Leonel Ramsay , who verbally acknowledged these results. Electronically Signed   By: Staci Righter M.D.   On: 11/13/2018 13:13   Ct Head Code Stroke Wo Contrast  Result Date: 11/13/2018 CLINICAL DATA:  Code stroke. Right facial droop. Left arm weakness. Last seen normal 3 hours ago. EXAM: CT HEAD WITHOUT CONTRAST TECHNIQUE: Contiguous axial images were obtained from the base of the  skull through the vertex without intravenous contrast. COMPARISON:  MRI of the brain 01/31/2016 FINDINGS: Brain: Remote lacunar infarcts of the basal ganglia bilaterally are stable. No new lesions are present. Insular cortex is intact. No acute or focal cortical abnormalities are present. Diffuse white matter changes are again seen. The ventricles are of proportionate to the degree of atrophy. No significant extraaxial fluid collection is present. Remote lacunar infarcts are present in the right cerebellum. Vascular: Atherosclerotic calcifications present within the cavernous internal carotid arteries bilaterally. There is no hyperdense vessel. Skull: Calvarium is intact. No focal lytic or blastic lesions are present. Sinuses/Orbits: The paranasal sinuses and mastoid air cells are clear. The globes and orbits are within normal limits. ASPECTS [facility] Stroke Program Early CT Score) - Ganglionic level infarction (caudate, lentiform nuclei, internal capsule, insula, M1-M3 cortex): 7/7 - Supraganglionic infarction (M4-M6 cortex): 3/3 Total score (0-10 with 10 being normal): 10/10 IMPRESSION: 1. No acute intracranial abnormality or significant interval change. 2. Stable remote lacunar infarcts of the basal ganglia bilaterally. 3. Extensive white matter disease bilaterally likely reflects the sequela of chronic microvascular ischemia. 4. ASPECTS is 10/10 The above was relayed via text pager to Dr. Leonel Ramsay on 11/13/2018 at 12:03 . Electronically Signed   By: San Morelle M.D.   On: 11/13/2018 12:08    Procedures Procedures (including critical care time)  Medications Ordered in ED Medications  iohexol (OMNIPAQUE) 350 MG/ML injection 75 mL (75 mLs Intravenous Contrast Given 11/13/18 1200)     Initial Impression / Assessment and Plan / ED Course  I have reviewed the triage vital signs and the nursing notes.  Pertinent labs & imaging results that were available during my care of the patient were  reviewed by me and considered in my medical decision making (see chart for details).  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  He was brought here as a code stroke and was evaluated immediately upon arrival.  Patient was seen by myself and neurology as soon as they entered the department, then went to radiology for a CT scan of the head.  This was performed and was unremarkable.  Patient's neurologic symptoms improving while in the ED.  He tells me that he currently feels fine and has no complaints.  Unfortunately, his MRI did reveal what appears to be few tiny white matter infarctions in the right frontal and parietal regions.  This finding was discussed with Dr. Leonel Ramsay from neurology.  It is his recommendation that the patient be admitted for stroke work-up.  Patient is unwilling to stay in the hospital and is requesting to sign out [place].  Patient understands that he has had a stroke and that this could signify a much larger stroke in the near future that could cause death and or serious disability.  He still desires to sign out [place].  Patient will be given Plavix for 3 weeks and advised to continue taking an 81 mg aspirin at the recommendation of Dr. Leonel Ramsay.  Patient is to follow-up with his primary doctor to discuss his situation.  Patient quotes his wife being worried about him as the reason to not stay.  I advised him that he could call and inform her of his situation, however he is unwilling to do this.  Patient appears to have medical decision-making capacity to refuse care and will be discharged [facility] at his request.  CRITICAL CARE Performed by: Veryl Speak Total critical care time: 45 minutes Critical care time was exclusive of separately billable procedures and treating other patients. Critical care was necessary to  treat or prevent imminent or life-threatening deterioration. Critical care was time spent personally by me on the following activities: development of treatment plan with patient and/or surrogate as well as nursing, discussions with consultants, evaluation of patient's response to treatment, examination of patient, obtaining history from patient or surrogate, ordering and performing treatments and interventions, ordering and review of laboratory studies, ordering and review of radiographic studies, pulse oximetry and re-evaluation of patient's condition.   Final Clinical Impressions(s) / ED Diagnoses   Final diagnoses:  None    ED Discharge Orders    None       Veryl Speak, MD 11/13/18 [phone]

## 2018-11-13 NOTE — Consult Note (Addendum)
Neurology Consult Reason : Right facial droop, nausea vomiting, Consulting MD: DELO  Code Stroke  History is obtained from: Patient and EMS  HPI: Joe Perry is a 68 y.o. male with history of stroke hypertension and diabetes.  Patient has not been taking his hypertensive or diabetic medications for at least a week.  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  At 10:50 a fellow teacher came in and noticed that patient not taking and then stirring his coffee and then urinated himself. He was noted to be confused and very quite along with not turn his head to look at other teacher. He remained slow to answer.    Per,EMS, had a right facial droop which was present during transportation. EMS was called and patient was brought immediately to [facility] for evaluation.  In route patient's right facial droop had resolved however on arrival the right facial droop was noticeable again.  When patient held bilateral arms out patient was noted to have asterixis, however patient states this is been going on for at least a year.   PER PATIENT: felt cold, got jacket and felt light headed and sat down and wanted to go to bathroom and urinated on himself and could not go due to light headed sensation but never lost consciousness.   --Patient was previously seen on 01/31/2016. At that time he felt dizzy, was off balance and had a right facial droop.    ED course CT/CTA of head and neck/labs   LKW: 9 AM on 11/13/2018 tpa given?: yes Premorbid modified Rankin scale (mRS): 0 NIH stroke scale: 3  ROS: A 14 point ROS was performed and is negative except as noted in the HPI.   Past Medical History:  Diagnosis Date  . Diabetes mellitus without complication ([place])   . Hypertension   . Stroke [facility])    Family History  Problem Relation Age of Onset  . Diabetes Unknown   . Hypertension Unknown    Social History:   reports that he has never smoked. He does not have any smokeless  tobacco history on file. He reports that he does not drink alcohol or use drugs.  Medications No current facility-administered medications for this encounter.   Current Outpatient Medications:  .  aspirin EC 81 MG tablet, Take 1 tablet (81 mg total) by mouth daily., Disp: 30 tablet, Rfl: 3 .  baclofen (LIORESAL) 10 MG tablet, Take 5mg  (1/2 a tablet) twice a day and then 10mg  (a whole tablet at bedtime), Disp: 60 each, Rfl: 0 .  clopidogrel (PLAVIX) 75 MG tablet, Take 75 mg by mouth daily., Disp: , Rfl:  .  hydrochlorothiazide (MICROZIDE) 12.5 MG capsule, Take 12.5 mg by mouth daily., Disp: , Rfl:  .  metFORMIN (GLUCOPHAGE) 1000 MG tablet, Take 1 tablet (1,000 mg total) by mouth 2 (two) times daily with a meal. (Patient taking differently: Take 1,000 mg by mouth daily with breakfast. ), Disp: 180 tablet, Rfl: 3 .  senna-docusate (SENOKOT-S) 8.6-50 MG tablet, Take 1 tablet by mouth at bedtime as needed for moderate constipation., Disp: 30 tablet, Rfl: 0   Exam: Current vital signs: Wt 90.2 kg   BMI 29.37 kg/m  Vital signs in last 24 hours: Weight:  [90.2 kg] 90.2 kg (03/11 1100)  Physical Exam  Constitutional: Appears well-developed and well-nourished.  Psych: Affect appropriate to situation Eyes: No scleral injection HENT: No OP obstrucion Head: Normocephalic.  Cardiovascular: Normal rate and regular rhythm.  Respiratory: Effort normal, non-labored breathing GI: Soft.  No distension. There is no tenderness.  Skin: WDI  Neuro: Mental Status: Patient is awake, alert, oriented to person, place, month, year, and situation. Patient is able to give a clear and coherent history. No signs of aphasia or neglect.  Cranial Nerves: II: Visual Fields are full.    III,IV, VI: EOMI without ptosis or diploplia. Pupils are equal, round, and reactive to light. V: Facial sensation is symmetric to temperature VII: Right facial droop VIII: hearing is intact to voice X: Uvula elevates  symmetrically XI: Shoulder shrug is symmetric. XII: tongue is midline without atrophy or fasciculations.  Motor: Tone is normal. Bulk is normal. 5/5 strength was present in all four extremities.  Asterixis noted bilaterally. Sensory: Sensation is symmetric to light touch and temperature in the arms and legs.  Cerebellar: Dysmetric on the left finger to nose no ataxia with heel-to-shin Gait: wide based and unstudy  Labs I have reviewed labs in epic and the results pertinent to this consultation are:   CBC    Component Value Date/Time   WBC 10.8 (H) 01/31/2016 0858   RBC 4.79 01/31/2016 0858   HGB 14.7 01/31/2016 0858   HCT 45.4 01/31/2016 0858   PLT 241 01/31/2016 0858   MCV 94.8 01/31/2016 0858   MCH 30.7 01/31/2016 0858   MCHC 32.4 01/31/2016 0858   RDW 13.3 01/31/2016 0858   LYMPHSABS 1.5 01/30/2016 2304   MONOABS 0.8 01/30/2016 2304   EOSABS 0.0 01/30/2016 2304   BASOSABS 0.0 01/30/2016 2304    CMP     Component Value Date/Time   NA 143 01/30/2016 2318   K 3.7 01/30/2016 2318   CL 99 (L) 01/30/2016 2318   CO2 26 01/30/2016 2304   GLUCOSE 236 (H) 01/30/2016 2318   BUN 23 (H) 01/30/2016 2318   CREATININE 1.03 01/31/2016 0858   CREATININE 0.96 09/08/2013 1735   CALCIUM 10.1 01/30/2016 2304   PROT 7.9 01/30/2016 2304   ALBUMIN 4.4 01/30/2016 2304   AST 17 01/30/2016 2304   ALT 26 01/30/2016 2304   ALKPHOS 57 01/30/2016 2304   BILITOT 1.6 (H) 01/30/2016 2304   GFRNONAA >60 01/31/2016 0858   GFRNONAA 86 09/08/2013 1735   GFRAA >60 01/31/2016 0858   GFRAA >89 09/08/2013 1735    Lipid Panel     Component Value Date/Time   CHOL 118 01/31/2016 0858   TRIG 96 01/31/2016 0858   HDL 48 01/31/2016 0858   CHOLHDL 2.5 01/31/2016 0858   VLDL 19 01/31/2016 0858   LDLCALC 51 01/31/2016 0858     Imaging I have reviewed the images obtained:  CT-scan of the brain--No acute intracranial abnormality or significant interval change. Stable remote lacunar infarcts of  the basal ganglia bilaterally.Extensive white matter disease bilaterally likely reflects the sequela of chronic microvascular ischemia.  CTA head and neck:--Bilateral fetal PCAs with diminutive basilar  Joe Quill PA-C [facility] [phone]  M-F  (9:00 am- 5:00 PM)  11/13/2018, 12:01 PM  I have seen the patient and reviewed the above note.  He has right facial droop with left-sided ataxia which was documented previously by Dr. Silverio Decamp.  He is ataxic on gait, but he states that he feels this is his normal state.  I tried calling his wife for ancillary history, but unfortunately there was no answer.   Assessment: 68 year old male with transient dizziness, unsteadiness.  The description given by the patient coupled with what was described by coworkers sounds most  consistent with presyncope.  He was aware of the need to go to the bathroom, he just felt too dizzy to get there so the incontinence was urge incontinence rather than loss of control. tPA was not indicated as he returned to his baseline symptoms.   Recommendations: 1) MRI brain 2) If this is negative, would favor treating this as presyncope.  3) If positive, then will need further stroke workup.  4) Could consider EEG as well, but low suspicion at this time for seizure and this could be done as an outpatient.   Joe Rack, MD [facility] [phone]  If 7pm- 7am, please page neurology on call as listed in [place].

## 2018-11-13 NOTE — ED Triage Notes (Signed)
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  EMS assessed pt and noted right facial droop and left arm weakness.  Pt hypertensive with headache.  EMS reported symptoms resolved during transport to ED.

## 2018-11-13 NOTE — Code Documentation (Signed)
69yo male arriving to [facility] via [place] at 17. 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 EMS called who assessed right facial droop and left arm weakness and activated a code stroke. Patient was hypertensive and reported a headache. EMS reported symptoms resolved and returned during the transport. Stroke team at the bedside on patient arrival. Patient cleared for CT by Dr. Stark Jock and patient to CT with team. Patient to CT with team. CT completed followed by CTA. NIHSS 3, see documentation for details and code stroke times. Patient with left arm ataxia and right facial droop. Of note, patient has been off his BP medications for 2 months. Patient walked in CT with unsteady gait. tPA offered and patient agreeable. Patient to ED room. Labs drawn. tPA mixed, however, while preparing to administer, patient reported he was at his baseline with his current deficits which was confirmed by MD in the EMR. Decision made not to administer tPA. Patient to undergo MRI. Bedside handoff with ED RN Jenny Reichmann.

## 2018-11-13 NOTE — ED Notes (Signed)
Pt has unsteady gait at baseline.

## 2018-11-13 NOTE — ED Notes (Signed)
Patient transported to MRI 

## 2018-11-13 NOTE — Discharge Instructions (Addendum)
Begin taking Plavix as prescribed for the next 3 weeks.  Begin taking a baby aspirin (81 mg) once daily indefinitely.  Follow-up with your primary doctor in the next week to be rechecked.  You are welcome to return to the ER at anytime if you desire completion of your stroke work-up and admission to the hospital.

## 2018-11-13 NOTE — ED Notes (Signed)
Reviewed rx with patient, discussed risks of leaving with MD and he signed out AMA. Stable.

## 2019-03-10 ENCOUNTER — Emergency Department (HOSPITAL_COMMUNITY): Payer: Medicare Other | Admitting: Anesthesiology

## 2019-03-10 ENCOUNTER — Encounter (HOSPITAL_COMMUNITY): Payer: Self-pay

## 2019-03-10 ENCOUNTER — Emergency Department (HOSPITAL_COMMUNITY): Payer: Medicare Other

## 2019-03-10 ENCOUNTER — Other Ambulatory Visit: Payer: Self-pay

## 2019-03-10 ENCOUNTER — Encounter (HOSPITAL_COMMUNITY)
Admission: EM | Disposition: A | Discharge status: Rehabilitation Facility | Payer: Self-pay | Source: Home / Self Care | Attending: Neurology

## 2019-03-10 ENCOUNTER — Inpatient Hospital Stay (HOSPITAL_COMMUNITY)
Admission: EM | Admit: 2019-03-10 | Discharge: 2019-06-03 | DRG: 024 | Disposition: A | Discharge status: Rehabilitation Facility | Payer: Medicare Other | Attending: Neurology | Admitting: Neurology

## 2019-03-10 DIAGNOSIS — D333 Benign neoplasm of cranial nerves: Secondary | ICD-10-CM | POA: Diagnosis present

## 2019-03-10 DIAGNOSIS — H510 Palsy (spasm) of conjugate gaze: Secondary | ICD-10-CM | POA: Diagnosis present

## 2019-03-10 DIAGNOSIS — R4189 Other symptoms and signs involving cognitive functions and awareness: Secondary | ICD-10-CM | POA: Diagnosis present

## 2019-03-10 DIAGNOSIS — B957 Other staphylococcus as the cause of diseases classified elsewhere: Secondary | ICD-10-CM | POA: Diagnosis not present

## 2019-03-10 DIAGNOSIS — I658 Occlusion and stenosis of other precerebral arteries: Secondary | ICD-10-CM | POA: Diagnosis present

## 2019-03-10 DIAGNOSIS — I639 Cerebral infarction, unspecified: Secondary | ICD-10-CM | POA: Diagnosis present

## 2019-03-10 DIAGNOSIS — Z8673 Personal history of transient ischemic attack (TIA), and cerebral infarction without residual deficits: Secondary | ICD-10-CM

## 2019-03-10 DIAGNOSIS — R260 Ataxic gait: Secondary | ICD-10-CM | POA: Diagnosis present

## 2019-03-10 DIAGNOSIS — E11649 Type 2 diabetes mellitus with hypoglycemia without coma: Secondary | ICD-10-CM | POA: Diagnosis not present

## 2019-03-10 DIAGNOSIS — Z6829 Body mass index (BMI) 29.0-29.9, adult: Secondary | ICD-10-CM

## 2019-03-10 DIAGNOSIS — Q281 Other malformations of precerebral vessels: Secondary | ICD-10-CM

## 2019-03-10 DIAGNOSIS — T17908A Unspecified foreign body in respiratory tract, part unspecified causing other injury, initial encounter: Secondary | ICD-10-CM

## 2019-03-10 DIAGNOSIS — Z23 Encounter for immunization: Secondary | ICD-10-CM | POA: Diagnosis present

## 2019-03-10 DIAGNOSIS — I69398 Other sequelae of cerebral infarction: Secondary | ICD-10-CM

## 2019-03-10 DIAGNOSIS — E785 Hyperlipidemia, unspecified: Secondary | ICD-10-CM

## 2019-03-10 DIAGNOSIS — R1310 Dysphagia, unspecified: Secondary | ICD-10-CM | POA: Diagnosis present

## 2019-03-10 DIAGNOSIS — G8194 Hemiplegia, unspecified affecting left nondominant side: Secondary | ICD-10-CM | POA: Diagnosis present

## 2019-03-10 DIAGNOSIS — Z7982 Long term (current) use of aspirin: Secondary | ICD-10-CM

## 2019-03-10 DIAGNOSIS — E876 Hypokalemia: Secondary | ICD-10-CM | POA: Diagnosis not present

## 2019-03-10 DIAGNOSIS — E1151 Type 2 diabetes mellitus with diabetic peripheral angiopathy without gangrene: Secondary | ICD-10-CM | POA: Diagnosis not present

## 2019-03-10 DIAGNOSIS — K5900 Constipation, unspecified: Secondary | ICD-10-CM | POA: Diagnosis not present

## 2019-03-10 DIAGNOSIS — R509 Fever, unspecified: Secondary | ICD-10-CM

## 2019-03-10 DIAGNOSIS — I651 Occlusion and stenosis of basilar artery: Secondary | ICD-10-CM | POA: Diagnosis present

## 2019-03-10 DIAGNOSIS — R4701 Aphasia: Secondary | ICD-10-CM | POA: Diagnosis not present

## 2019-03-10 DIAGNOSIS — D649 Anemia, unspecified: Secondary | ICD-10-CM | POA: Diagnosis not present

## 2019-03-10 DIAGNOSIS — G2581 Restless legs syndrome: Secondary | ICD-10-CM | POA: Diagnosis not present

## 2019-03-10 DIAGNOSIS — Z833 Family history of diabetes mellitus: Secondary | ICD-10-CM

## 2019-03-10 DIAGNOSIS — B952 Enterococcus as the cause of diseases classified elsewhere: Secondary | ICD-10-CM | POA: Diagnosis not present

## 2019-03-10 DIAGNOSIS — Z20828 Contact with and (suspected) exposure to other viral communicable diseases: Secondary | ICD-10-CM | POA: Diagnosis present

## 2019-03-10 DIAGNOSIS — R1110 Vomiting, unspecified: Secondary | ICD-10-CM

## 2019-03-10 DIAGNOSIS — G4700 Insomnia, unspecified: Secondary | ICD-10-CM | POA: Diagnosis not present

## 2019-03-10 DIAGNOSIS — Z7902 Long term (current) use of antithrombotics/antiplatelets: Secondary | ICD-10-CM

## 2019-03-10 DIAGNOSIS — Z8249 Family history of ischemic heart disease and other diseases of the circulatory system: Secondary | ICD-10-CM

## 2019-03-10 DIAGNOSIS — R451 Restlessness and agitation: Secondary | ICD-10-CM | POA: Diagnosis not present

## 2019-03-10 DIAGNOSIS — R471 Dysarthria and anarthria: Secondary | ICD-10-CM | POA: Diagnosis present

## 2019-03-10 DIAGNOSIS — R414 Neurologic neglect syndrome: Secondary | ICD-10-CM | POA: Diagnosis not present

## 2019-03-10 DIAGNOSIS — N3000 Acute cystitis without hematuria: Secondary | ICD-10-CM

## 2019-03-10 DIAGNOSIS — F8081 Childhood onset fluency disorder: Secondary | ICD-10-CM

## 2019-03-10 DIAGNOSIS — E663 Overweight: Secondary | ICD-10-CM | POA: Diagnosis present

## 2019-03-10 DIAGNOSIS — I63531 Cerebral infarction due to unspecified occlusion or stenosis of right posterior cerebral artery: Principal | ICD-10-CM | POA: Diagnosis present

## 2019-03-10 DIAGNOSIS — I10 Essential (primary) hypertension: Secondary | ICD-10-CM

## 2019-03-10 DIAGNOSIS — E860 Dehydration: Secondary | ICD-10-CM

## 2019-03-10 DIAGNOSIS — N390 Urinary tract infection, site not specified: Secondary | ICD-10-CM | POA: Diagnosis not present

## 2019-03-10 DIAGNOSIS — I672 Cerebral atherosclerosis: Secondary | ICD-10-CM | POA: Diagnosis present

## 2019-03-10 DIAGNOSIS — I69391 Dysphagia following cerebral infarction: Secondary | ICD-10-CM

## 2019-03-10 DIAGNOSIS — Z79899 Other long term (current) drug therapy: Secondary | ICD-10-CM

## 2019-03-10 DIAGNOSIS — H53462 Homonymous bilateral field defects, left side: Secondary | ICD-10-CM | POA: Diagnosis present

## 2019-03-10 DIAGNOSIS — H518 Other specified disorders of binocular movement: Secondary | ICD-10-CM | POA: Diagnosis present

## 2019-03-10 DIAGNOSIS — Z7984 Long term (current) use of oral hypoglycemic drugs: Secondary | ICD-10-CM

## 2019-03-10 DIAGNOSIS — I6381 Other cerebral infarction due to occlusion or stenosis of small artery: Secondary | ICD-10-CM

## 2019-03-10 DIAGNOSIS — I6523 Occlusion and stenosis of bilateral carotid arteries: Secondary | ICD-10-CM | POA: Diagnosis present

## 2019-03-10 DIAGNOSIS — G255 Other chorea: Secondary | ICD-10-CM | POA: Diagnosis not present

## 2019-03-10 DIAGNOSIS — R29710 NIHSS score 10: Secondary | ICD-10-CM | POA: Diagnosis present

## 2019-03-10 DIAGNOSIS — E119 Type 2 diabetes mellitus without complications: Secondary | ICD-10-CM

## 2019-03-10 DIAGNOSIS — T43595A Adverse effect of other antipsychotics and neuroleptics, initial encounter: Secondary | ICD-10-CM | POA: Diagnosis not present

## 2019-03-10 HISTORY — PX: IR ANGIO INTRA EXTRACRAN SEL COM CAROTID INNOMINATE UNI L MOD SED: IMG5358

## 2019-03-10 HISTORY — PX: IR PERCUTANEOUS ART THROMBECTOMY/INFUSION INTRACRANIAL INC DIAG ANGIO: IMG6087

## 2019-03-10 HISTORY — PX: IR ANGIO VERTEBRAL SEL SUBCLAVIAN INNOMINATE BILAT MOD SED: IMG5366

## 2019-03-10 HISTORY — PX: RADIOLOGY WITH ANESTHESIA: SHX6223

## 2019-03-10 HISTORY — PX: IR CT HEAD LTD: IMG2386

## 2019-03-10 LAB — COMPREHENSIVE METABOLIC PANEL
ALT: 16 U/L (ref 0–44)
AST: 18 U/L (ref 15–41)
Albumin: 3.8 g/dL (ref 3.5–5.0)
Alkaline Phosphatase: 49 U/L (ref 38–126)
Anion gap: 10 (ref 5–15)
BUN: 8 mg/dL (ref 8–23)
CO2: 24 mmol/L (ref 22–32)
Calcium: 9.2 mg/dL (ref 8.9–10.3)
Chloride: 102 mmol/L (ref 98–111)
Creatinine, Ser: 0.88 mg/dL (ref 0.61–1.24)
GFR calc Af Amer: >60 mL/min (ref >60)
GFR calc non Af Amer: >60 mL/min (ref >60)
Glucose, Bld: 242 mg/dL — ABNORMAL HIGH (ref 70–99)
Potassium: 4.1 mmol/L (ref 3.5–5.1)
Sodium: 136 mmol/L (ref 135–145)
Total Bilirubin: 1.1 mg/dL (ref 0.3–1.2)
Total Protein: 7 g/dL (ref 6.5–8.1)

## 2019-03-10 LAB — DIFFERENTIAL
Abs Immature Granulocytes: 0.01 10*3/uL (ref 0.00–0.07)
Basophils Absolute: 0 10*3/uL (ref 0.0–0.1)
Basophils Relative: 0 %
Eosinophils Absolute: 0.1 10*3/uL (ref 0.0–0.5)
Eosinophils Relative: 1 %
Immature Granulocytes: 0 %
Lymphocytes Relative: 35 %
Lymphs Abs: 2.1 10*3/uL (ref 0.7–4.0)
Monocytes Absolute: 0.4 10*3/uL (ref 0.1–1.0)
Monocytes Relative: 7 %
Neutro Abs: 3.4 10*3/uL (ref 1.7–7.7)
Neutrophils Relative %: 57 %

## 2019-03-10 LAB — CBC
HCT: 42.1 % (ref 39.0–52.0)
Hemoglobin: 13.9 g/dL (ref 13.0–17.0)
MCH: 30.5 pg (ref 26.0–34.0)
MCHC: 33 g/dL (ref 30.0–36.0)
MCV: 92.3 fL (ref 80.0–100.0)
Platelets: 206 10*3/uL (ref 150–400)
RBC: 4.56 MIL/uL (ref 4.22–5.81)
RDW: 12.2 % (ref 11.5–15.5)
WBC: 6.1 10*3/uL (ref 4.0–10.5)
nRBC: 0 % (ref 0.0–0.2)

## 2019-03-10 LAB — MRSA PCR SCREENING: MRSA by PCR: NEGATIVE

## 2019-03-10 LAB — SARS CORONAVIRUS 2 BY RT PCR (HOSPITAL ORDER, PERFORMED IN ~~LOC~~ HOSPITAL LAB): SARS Coronavirus 2: NEGATIVE

## 2019-03-10 LAB — I-STAT CHEM 8, ED
BUN: 11 mg/dL (ref 8–23)
Calcium, Ion: 1.06 mmol/L — ABNORMAL LOW (ref 1.15–1.40)
Chloride: 103 mmol/L (ref 98–111)
Creatinine, Ser: 0.7 mg/dL (ref 0.61–1.24)
Glucose, Bld: 245 mg/dL — ABNORMAL HIGH (ref 70–99)
HCT: 43 % (ref 39.0–52.0)
Hemoglobin: 14.6 g/dL (ref 13.0–17.0)
Potassium: 4.1 mmol/L (ref 3.5–5.1)
Sodium: 136 mmol/L (ref 135–145)
TCO2: 26 mmol/L (ref 22–32)

## 2019-03-10 LAB — GLUCOSE, CAPILLARY: Glucose-Capillary: 256 mg/dL — ABNORMAL HIGH (ref 70–99)

## 2019-03-10 LAB — CBG MONITORING, ED: Glucose-Capillary: 220 mg/dL — ABNORMAL HIGH (ref 70–99)

## 2019-03-10 LAB — APTT: aPTT: 28 seconds (ref 24–36)

## 2019-03-10 LAB — PROTIME-INR
INR: 1.1 (ref 0.8–1.2)
Prothrombin Time: 14.3 seconds (ref 11.4–15.2)

## 2019-03-10 SURGERY — IR WITH ANESTHESIA
Anesthesia: General

## 2019-03-10 MED ORDER — LIDOCAINE HCL 1 % IJ SOLN
INTRAMUSCULAR | Status: AC
  Filled 2019-03-10: qty 20

## 2019-03-10 MED ORDER — CLOPIDOGREL BISULFATE 75 MG PO TABS
75.0000 mg | ORAL_TABLET | Freq: Every day | ORAL | Status: DC
  Filled 2019-03-10: qty 1

## 2019-03-10 MED ORDER — SODIUM CHLORIDE 0.9% FLUSH: 3.0000 mL | Freq: Once | INTRAVENOUS | Status: DC

## 2019-03-10 MED ORDER — TIROFIBAN HCL IN NACL 5-0.9 MG/100ML-% IV SOLN
INTRAVENOUS | Status: AC
  Filled 2019-03-10: qty 100

## 2019-03-10 MED ORDER — IOHEXOL 300 MG/ML  SOLN
300.0000 mL | Freq: Once | INTRAMUSCULAR | Status: AC | PRN
  Administered 2019-03-10: 120 mL via INTRA_ARTERIAL

## 2019-03-10 MED ORDER — STROKE: EARLY STAGES OF RECOVERY BOOK
Freq: Once | Status: DC
  Filled 2019-03-10: qty 1

## 2019-03-10 MED ORDER — SUCCINYLCHOLINE CHLORIDE 200 MG/10ML IV SOSY
PREFILLED_SYRINGE | INTRAVENOUS | Status: DC | PRN
  Administered 2019-03-10: 200 mg via INTRAVENOUS

## 2019-03-10 MED ORDER — PHENYLEPHRINE 40 MCG/ML (10ML) SYRINGE FOR IV PUSH (FOR BLOOD PRESSURE SUPPORT)
PREFILLED_SYRINGE | INTRAVENOUS | Status: DC | PRN
  Administered 2019-03-10: 40 ug via INTRAVENOUS
  Administered 2019-03-10: 80 ug via INTRAVENOUS
  Administered 2019-03-10 (×2): 40 ug via INTRAVENOUS

## 2019-03-10 MED ORDER — CLEVIDIPINE BUTYRATE 0.5 MG/ML IV EMUL
INTRAVENOUS | Status: AC
  Filled 2019-03-10: qty 50

## 2019-03-10 MED ORDER — CEFAZOLIN SODIUM-DEXTROSE 1-4 GM/50ML-% IV SOLN
INTRAVENOUS | Status: DC | PRN
  Administered 2019-03-10: 2 g via INTRAVENOUS

## 2019-03-10 MED ORDER — ASPIRIN 325 MG PO TABS
325.0000 mg | ORAL_TABLET | Freq: Every day | ORAL | Status: DC
  Administered 2019-03-11 – 2019-03-17 (×7): 325 mg via ORAL
  Filled 2019-03-10 (×7): qty 1

## 2019-03-10 MED ORDER — IOHEXOL 300 MG/ML  SOLN: 100.0000 mL | Freq: Once | INTRAMUSCULAR | Status: DC | PRN

## 2019-03-10 MED ORDER — FENTANYL CITRATE (PF) 250 MCG/5ML IJ SOLN
INTRAMUSCULAR | Status: DC | PRN
  Administered 2019-03-10: 100 ug via INTRAVENOUS

## 2019-03-10 MED ORDER — SUGAMMADEX SODIUM 200 MG/2ML IV SOLN
INTRAVENOUS | Status: DC | PRN
  Administered 2019-03-10: 200 mg via INTRAVENOUS

## 2019-03-10 MED ORDER — SODIUM CHLORIDE 0.9 % IV SOLN
INTRAVENOUS | Status: DC | PRN
  Administered 2019-03-10: 20 ug/min via INTRAVENOUS

## 2019-03-10 MED ORDER — INSULIN ASPART 100 UNIT/ML ~~LOC~~ SOLN
0.0000 [IU] | Freq: Three times a day (TID) | SUBCUTANEOUS | Status: DC
  Administered 2019-03-11: 13:00:00 2 [IU] via SUBCUTANEOUS
  Administered 2019-03-11: 3 [IU] via SUBCUTANEOUS
  Administered 2019-03-12: 5 [IU] via SUBCUTANEOUS
  Administered 2019-03-12: 3 [IU] via SUBCUTANEOUS
  Administered 2019-03-13: 2 [IU] via SUBCUTANEOUS
  Administered 2019-03-13 – 2019-03-14 (×4): 3 [IU] via SUBCUTANEOUS
  Administered 2019-03-15: 14:00:00 2 [IU] via SUBCUTANEOUS
  Administered 2019-03-15 – 2019-03-16 (×2): 3 [IU] via SUBCUTANEOUS
  Administered 2019-03-16: 2 [IU] via SUBCUTANEOUS
  Administered 2019-03-17: 13:00:00 3 [IU] via SUBCUTANEOUS
  Administered 2019-03-17: 18:00:00 5 [IU] via SUBCUTANEOUS
  Administered 2019-03-17: 09:00:00 3 [IU] via SUBCUTANEOUS
  Administered 2019-03-18: 2 [IU] via SUBCUTANEOUS
  Administered 2019-03-18 – 2019-03-19 (×3): 3 [IU] via SUBCUTANEOUS
  Administered 2019-03-20 (×2): 2 [IU] via SUBCUTANEOUS
  Administered 2019-03-21: 3 [IU] via SUBCUTANEOUS
  Administered 2019-03-22 – 2019-03-25 (×5): 2 [IU] via SUBCUTANEOUS
  Administered 2019-03-26 – 2019-03-29 (×4): 3 [IU] via SUBCUTANEOUS
  Administered 2019-03-30: 2 [IU] via SUBCUTANEOUS
  Administered 2019-03-31: 3 [IU] via SUBCUTANEOUS
  Administered 2019-04-01 – 2019-04-02 (×2): 2 [IU] via SUBCUTANEOUS
  Administered 2019-04-03 – 2019-04-04 (×3): 3 [IU] via SUBCUTANEOUS
  Administered 2019-04-06 – 2019-04-07 (×2): 2 [IU] via SUBCUTANEOUS
  Administered 2019-04-08: 5 [IU] via SUBCUTANEOUS
  Administered 2019-04-09: 17:00:00 2 [IU] via SUBCUTANEOUS
  Administered 2019-04-09: 3 [IU] via SUBCUTANEOUS
  Administered 2019-04-10 (×3): 2 [IU] via SUBCUTANEOUS
  Administered 2019-04-11: 09:00:00 3 [IU] via SUBCUTANEOUS
  Administered 2019-04-12 – 2019-04-21 (×6): 2 [IU] via SUBCUTANEOUS
  Administered 2019-04-26 – 2019-04-27 (×2): 3 [IU] via SUBCUTANEOUS
  Administered 2019-04-28 – 2019-05-12 (×9): 2 [IU] via SUBCUTANEOUS

## 2019-03-10 MED ORDER — SODIUM CHLORIDE 0.9 % IV SOLN
INTRAVENOUS | Status: DC
  Administered 2019-03-16: via INTRAVENOUS

## 2019-03-10 MED ORDER — ACETAMINOPHEN 650 MG RE SUPP: 650.0000 mg | RECTAL | Status: DC | PRN

## 2019-03-10 MED ORDER — ROCURONIUM BROMIDE 10 MG/ML (PF) SYRINGE
PREFILLED_SYRINGE | INTRAVENOUS | Status: DC | PRN
  Administered 2019-03-10: 20 mg via INTRAVENOUS
  Administered 2019-03-10: 50 mg via INTRAVENOUS

## 2019-03-10 MED ORDER — NITROGLYCERIN 1 MG/10 ML FOR IR/CATH LAB
INTRA_ARTERIAL | Status: AC
  Filled 2019-03-10: qty 10

## 2019-03-10 MED ORDER — IOHEXOL 350 MG/ML SOLN
125.0000 mL | Freq: Once | INTRAVENOUS | Status: AC | PRN
  Administered 2019-03-10: 125 mL via INTRAVENOUS

## 2019-03-10 MED ORDER — CLOPIDOGREL BISULFATE 300 MG PO TABS
ORAL_TABLET | ORAL | Status: AC
  Filled 2019-03-10: qty 1

## 2019-03-10 MED ORDER — ACETAMINOPHEN 325 MG PO TABS
650.0000 mg | ORAL_TABLET | ORAL | Status: DC | PRN
  Administered 2019-03-18 – 2019-05-28 (×13): 650 mg via ORAL
  Filled 2019-03-10 (×14): qty 2

## 2019-03-10 MED ORDER — SODIUM CHLORIDE 0.9 % IV SOLN
INTRAVENOUS | Status: DC
  Administered 2019-03-10 – 2019-03-13 (×6): via INTRAVENOUS

## 2019-03-10 MED ORDER — EPTIFIBATIDE 20 MG/10ML IV SOLN
INTRAVENOUS | Status: AC
  Filled 2019-03-10: qty 10

## 2019-03-10 MED ORDER — TICAGRELOR 90 MG PO TABS
ORAL_TABLET | ORAL | Status: AC
  Filled 2019-03-10: qty 2

## 2019-03-10 MED ORDER — FENTANYL CITRATE (PF) 100 MCG/2ML IJ SOLN
INTRAMUSCULAR | Status: AC
  Filled 2019-03-10: qty 2

## 2019-03-10 MED ORDER — ONDANSETRON HCL 4 MG/2ML IJ SOLN
INTRAMUSCULAR | Status: DC | PRN
  Administered 2019-03-10: 4 mg via INTRAVENOUS

## 2019-03-10 MED ORDER — LIDOCAINE 2% (20 MG/ML) 5 ML SYRINGE
INTRAMUSCULAR | Status: DC | PRN
  Administered 2019-03-10: 100 mg via INTRAVENOUS

## 2019-03-10 MED ORDER — ACETAMINOPHEN 325 MG PO TABS: 650.0000 mg | ORAL_TABLET | ORAL | Status: DC | PRN

## 2019-03-10 MED ORDER — ESMOLOL HCL 100 MG/10ML IV SOLN
INTRAVENOUS | Status: DC | PRN
  Administered 2019-03-10 (×2): 50 mg via INTRAVENOUS

## 2019-03-10 MED ORDER — ACETAMINOPHEN 160 MG/5ML PO SOLN: 650.0000 mg | ORAL | Status: DC | PRN

## 2019-03-10 MED ORDER — ASPIRIN 81 MG PO CHEW: 324.0000 mg | CHEWABLE_TABLET | Freq: Every day | ORAL | Status: DC

## 2019-03-10 MED ORDER — CLEVIDIPINE BUTYRATE 0.5 MG/ML IV EMUL
0.0000 mg/h | INTRAVENOUS | Status: AC
  Administered 2019-03-10 (×2): 14 mg/h via INTRAVENOUS
  Administered 2019-03-10: 16 mg/h via INTRAVENOUS
  Administered 2019-03-10: 10 mg/h via INTRAVENOUS
  Administered 2019-03-10: 1 mg/h via INTRAVENOUS
  Administered 2019-03-11: 6 mg/h via INTRAVENOUS
  Administered 2019-03-11: 10 mg/h via INTRAVENOUS
  Filled 2019-03-10 (×5): qty 50

## 2019-03-10 MED ORDER — ASPIRIN 325 MG PO TABS
ORAL_TABLET | ORAL | Status: AC
  Filled 2019-03-10: qty 1

## 2019-03-10 MED ORDER — EPTIFIBATIDE 20 MG/10ML IV SOLN
INTRAVENOUS | Status: DC | PRN
  Administered 2019-03-10 (×4): 1.5 mg via INTRAVENOUS

## 2019-03-10 MED ORDER — PROPOFOL 10 MG/ML IV BOLUS
INTRAVENOUS | Status: DC | PRN
  Administered 2019-03-10: 170 mg via INTRAVENOUS

## 2019-03-10 MED ORDER — CLOPIDOGREL BISULFATE 75 MG PO TABS
75.0000 mg | ORAL_TABLET | Freq: Every day | ORAL | Status: DC
  Administered 2019-03-11 – 2019-03-20 (×10): 75 mg via ORAL
  Filled 2019-03-10 (×10): qty 1

## 2019-03-10 MED ORDER — CEFAZOLIN SODIUM-DEXTROSE 2-4 GM/100ML-% IV SOLN
INTRAVENOUS | Status: AC
  Filled 2019-03-10: qty 100

## 2019-03-10 MED ORDER — IOHEXOL 350 MG/ML SOLN
100.0000 mL | Freq: Once | INTRAVENOUS | Status: AC | PRN
  Administered 2019-03-10: 100 mL via INTRAVENOUS

## 2019-03-10 NOTE — Anesthesia Procedure Notes (Addendum)
Arterial Line Insertion Start/End7/02/2019 3:44 PM Performed by: Renato Shin, CRNA, CRNA  Emergency situation Left, radial was placed Catheter size: 20 G Hand hygiene performed  and maximum sterile barriers used  Allen's test indicative of satisfactory collateral circulation Attempts: 1 Procedure performed without using ultrasound guided technique. Following insertion, dressing applied and Biopatch. Post procedure assessment: normal  Patient tolerated the procedure well with no immediate complications.

## 2019-03-10 NOTE — Anesthesia Preprocedure Evaluation (Signed)
Anesthesia Evaluation  Patient identified by MRN, date of birth, ID band Patient confused    Reviewed: Allergy & Precautions, NPO status , reviewed documented beta blocker date and time Preop documentation limited or incomplete due to emergent nature of procedure.  Airway Mallampati: II  TM Distance: >3 FB Neck ROM: full    Dental no notable dental hx. (+) Teeth Intact   Pulmonary neg pulmonary ROS,    Pulmonary exam normal breath sounds clear to auscultation       Cardiovascular Exercise Tolerance: Good hypertension, On Medications and Pt. on medications negative cardio ROS   Rhythm:regular Rate:Normal     Neuro/Psych CVA, Residual Symptoms negative psych ROS   GI/Hepatic negative GI ROS, Neg liver ROS,   Endo/Other  negative endocrine ROSdiabetes, Oral Hypoglycemic Agents  Renal/GU negative Renal ROS  negative genitourinary   Musculoskeletal   Abdominal   Peds  Hematology negative hematology ROS (+)   Anesthesia Other Findings   Reproductive/Obstetrics negative OB ROS                             Anesthesia Physical Anesthesia Plan  ASA: III and emergent  Anesthesia Plan: General   Post-op Pain Management:    Induction: Intravenous and Cricoid pressure planned  PONV Risk Score and Plan:   Airway Management Planned: Oral ETT  Additional Equipment:   Intra-op Plan:   Post-operative Plan: Possible Post-op intubation/ventilation  Informed Consent: I have reviewed the patients History and Physical, chart, labs and discussed the procedure including the risks, benefits and alternatives for the proposed anesthesia with the patient or authorized representative who has indicated his/her understanding and acceptance.     Dental Advisory Given  Plan Discussed with: CRNA and Anesthesiologist  Anesthesia Plan Comments: (  )        Anesthesia Quick Evaluation

## 2019-03-10 NOTE — H&P (Addendum)
Neurology and physical  History is obtained from: Wife  HPI: Joe Perry is a 68 y.o. male with past medical history of stroke, hypertension and diabetes.  Per wife patient was last seen normal at 14 when suddenly she noticed that her husband was weaker on the left side and did not seem to be talking as normal as he usually does.  At that point she thought he might of been hypoglycemic thus she got a wheelchair and drove him home.  His glucose was 256 and blood pressure was elevated in the 180s.  It was for this reason that she called EMS and he was brought as a code stroke to [facility].  It was discussed with family that he is out of the window for TPA at that time.-During consultation there was some language barrier however the majority of the information that was important was gathered.  CTA of head was obtained and did show a vessel that could be reached by interventional radiology.  Patient's wife was called back and they agreed to go along with having the intervention done.    LKW: 9:30 AM on 03/10/2019 tpa given?: no, out of the window Premorbid modified Rankin scale (mRS): 0 NIH stroke scale-10  ROS:  Unable to obtain due to altered mental status.   Past Medical History:  Diagnosis Date  . Diabetes mellitus without complication ([place])   . Hypertension   . Stroke [facility])     Family History  Problem Relation Age of Onset  . Diabetes Other   . Hypertension Other    Social History:   reports that he has never smoked. He does not have any smokeless tobacco history on file. He reports that he does not drink alcohol or use drugs.  Medications  Current Facility-Administered Medications:  .  aspirin 325 MG tablet, , , ,  .  ceFAZolin (ANCEF) 2-4 GM/100ML-% IVPB, , , ,  .  clevidipine (CLEVIPREX) 0.5 MG/ML infusion, , , ,  .  clopidogrel (PLAVIX) 300 MG tablet, , , ,  .  eptifibatide (INTEGRILIN) 20 MG/10ML injection, , , ,  .  fentaNYL (SUBLIMAZE) 100 MCG/2ML injection,  , , ,  .  lidocaine (XYLOCAINE) 1 % (with pres) injection, , , ,  .  nitroGLYCERIN 100 mcg/mL intra-arterial injection, , , ,  .  sodium chloride flush (NS) 0.9 % injection 3 mL, 3 mL, Intravenous, Once, Carmin Muskrat, MD .  ticagrelor (BRILINTA) 90 MG tablet, , , ,  .  tirofiban (AGGRASTAT) 5-0.9 MG/100ML-% injection, , , ,   Current Outpatient Medications:  .  aspirin EC 81 MG tablet, Take 1 tablet (81 mg total) by mouth daily. (Patient not taking: Reported on 11/13/2018), Disp: 30 tablet, Rfl: 3 .  baclofen (LIORESAL) 10 MG tablet, Take 5mg  (1/2 a tablet) twice a day and then 10mg  (a whole tablet at bedtime) (Patient not taking: Reported on 11/13/2018), Disp: 60 each, Rfl: 0 .  clopidogrel (PLAVIX) 75 MG tablet, Take 1 tablet (75 mg total) by mouth daily., Disp: 21 tablet, Rfl: 0 .  metFORMIN (GLUCOPHAGE) 1000 MG tablet, Take 1 tablet (1,000 mg total) by mouth 2 (two) times daily with a meal. (Patient not taking: Reported on 11/13/2018), Disp: 180 tablet, Rfl: 3   Exam: Current vital signs: BP (!) 186/97   Pulse 86   Temp 97.7 F (36.5 C) (Oral)   Resp 17   Wt 90 kg   SpO2 100%   BMI 29.30 kg/m  Vital signs in last 24 hours: Temp:  [97.7 F (36.5 C)] 97.7 F (36.5 C) (07/06 1450) Pulse Rate:  [86-95] 86 (07/06 1515) Resp:  [17] 17 (07/06 1515) BP: (168-186)/(97-100) 186/97 (07/06 1515) SpO2:  [100 %] 100 % (07/06 1515) Weight:  [90 kg] 90 kg (07/06 1506)  Physical Exam  Constitutional: Appears well-developed and well-nourished.  Psych: Affect appropriate to situation Eyes: No scleral injection HENT: No OP obstrucion Head: Normocephalic.  Cardiovascular: Normal rate and regular rhythm.  Respiratory: Effort normal, non-labored breathing GI: Soft.  No distension. There is no tenderness.  Skin: WDI  Neuro: Mental Status: Patient is awake, alert, Patient is not able to give a clear and coherent history. Positive a aphasia and neglect of left side Cranial Nerves: II:  Left hemianopsia.  III,IV, VI: EOMI without ptosis or diploplia. Pupils equal, round and reactive to light V: Facial sensation is symmetric to temperature VII: Facial movement is symmetric.  VIII: hearing is intact to voice X: Palat elevates symmetrically XI: Shoulder shrug is symmetric. XII: tongue is midline without atrophy or fasciculations.  Motor: Tone is normal. Bulk is normal. 5/5 strength was present in all four extremities.  Drift on the left side Sensory: Neglect of left arm and leg Deep Tendon Reflexes: 2+ in the upper extremities with no knee jerk or ankle jerk Plantars: Downgoing toe on the right and upgoing toe on the left Cerebellar: FNF show dysmetria on left however this was secondary to weakness  Labs I have reviewed labs in epic and the results pertinent to this consultation are:   CBC    Component Value Date/Time   WBC 6.1 03/10/2019 1441   RBC 4.56 03/10/2019 1441   HGB 14.6 03/10/2019 1448   HCT 43.0 03/10/2019 1448   PLT 206 03/10/2019 1441   MCV 92.3 03/10/2019 1441   MCH 30.5 03/10/2019 1441   MCHC 33.0 03/10/2019 1441   RDW 12.2 03/10/2019 1441   LYMPHSABS 2.1 03/10/2019 1441   MONOABS 0.4 03/10/2019 1441   EOSABS 0.1 03/10/2019 1441   BASOSABS 0.0 03/10/2019 1441    CMP     Component Value Date/Time   NA 136 03/10/2019 1448   K 4.1 03/10/2019 1448   CL 103 03/10/2019 1448   CO2 26 11/13/2018 1150   GLUCOSE 245 (H) 03/10/2019 1448   BUN 11 03/10/2019 1448   CREATININE 0.70 03/10/2019 1448   CREATININE 0.96 09/08/2013 1735   CALCIUM 9.2 11/13/2018 1150   PROT 6.9 11/13/2018 1150   ALBUMIN 3.7 11/13/2018 1150   AST 20 11/13/2018 1150   ALT 22 11/13/2018 1150   ALKPHOS 62 11/13/2018 1150   BILITOT 1.2 11/13/2018 1150   GFRNONAA >60 11/13/2018 1150   GFRNONAA 86 09/08/2013 1735   GFRAA >60 11/13/2018 1150   GFRAA >89 09/08/2013 1735    Lipid Panel     Component Value Date/Time   CHOL 118 01/31/2016 0858   TRIG 96 01/31/2016  0858   HDL 48 01/31/2016 0858   CHOLHDL 2.5 01/31/2016 0858   VLDL 19 01/31/2016 0858   LDLCALC 51 01/31/2016 0858     Imaging I have reviewed the images obtained:  CT-scan of the brain- no acute findings by CT.  Extensive chronic small vessel ischemic changes throughout the brain   CTA of head and neck- fetal type right PCA with proximal occlusion leading to 19 cc of penumbra and no infarct by CT perfusion.  There may be a right P1 segment that is also  occluded.  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  The right vertebral artery ends in the PICA with advanced intracranial stenosis.  Subjectively advanced left cavernous ICA stenosis.  30-40% atheromatous narrowing of the right ICA bulb   Etta Quill PA-C [facility] [phone]  M-F  (9:00 am- 5:00 PM)  03/10/2019, 3:27 PM   I have seen the patient and reviewed the above note.  Assessment: 68 year old male presenting with  right PCA with proximal occlusion leading to 19 cc penumbra and no infarct by CT perfusion.  Patient unfortunately was outside the window for TPA however mechanical thrombectomy was discussed with the family and they provided consent.    Impression: -PCA infarct  Plan: Admit to ICU  Acute Ischemic Stroke  Acuity: ICU -Admit to: ICU -Continue Aspirin/ Statin -Continue Statin -Blood pressure control, goal of SYS per IR -MRI/ECHO/A1C/Lipid panel. -Hyperglycemia management per SSI to maintain glucose 140-180mg /dL. -PT/OT/ST therapies and recommendations when able  CNS -Close neuro monitoring  Dysarthria Dysphagia following cerebral infarction  -NPO until cleared by speech  CV -BP control, goal SBP per IR recommendations -Titrate oral agents  Hyperlipidemia, unspecified  - Statin for goal LDL < 70   HEME -No issues -transfuse for hgb < 7   ENDO -goal HgbA1c < 7 - SSI  Prophylaxis DVT: SCD GI: Protonix Bowel: Senokot  Diet: NPO  until cleared by speech  Code Status: Full Code     This patient is critically ill and at significant risk of neurological worsening, death and care requires constant monitoring of vital signs, hemodynamics,respiratory and cardiac monitoring, neurological assessment, discussion with family, other specialists and medical decision making of high complexity. I spent 40 minutes of neurocritical care time  in the care of  this patient. This was time spent independent of any time provided by nurse practitioner or PA.  Roland Rack, MD [facility] [phone]  If 7pm- 7am, please page neurology on call as listed in [place]. 03/10/2019  7:10 PM

## 2019-03-10 NOTE — Code Documentation (Signed)
68 yo male with hx of HTN, DM, and stroke presents to the ED with sudden onset of unsteady gait, right gaze, left hemianopsia, and left sided weakness that started at 0930. [place] was noted to have the change. EMS called and activated a Code Stroke. Stroke Team met patient upon arrival to the ED. EDP cleared airway at the bridge. Initial NIHSS 10 due to inability to answer questions, right gaze, left hemianopsia, left arm and leg drift, and left neglect. Taken to CT. IR prenotified at 1444 for potential candidate. CT Head negative for hemorrhage and patient is outside the window for tPA. CTA/CTP completed. Pt brought back to room and placed on cardiac monitor. CTP resulted and showed occlusion in the right posterior brain with 19 mL of Penumbra and no core. Decision made to take patient to IR at 1508. IR notified and anesthesia notified. Foley placed, patient undressed, and pulses marked. Anesthesia arrived at 1522. Patient intubated and transported to IR at 1533. Arrival to IR at 1535. See Stroke Timeline and NIHSS for details. Report given to [place], Therapist, sports.

## 2019-03-10 NOTE — Progress Notes (Signed)
Anesthesia present for case 

## 2019-03-10 NOTE — ED Notes (Signed)
Pt transported to IR 

## 2019-03-10 NOTE — Progress Notes (Signed)
Report given to PACU RN Lovena Le at bedside. Pt stable, right groin level 0.

## 2019-03-10 NOTE — Anesthesia Procedure Notes (Signed)
Procedure Name: Intubation Date/Time: 03/10/2019 3:27 PM Performed by: Elayne Snare, CRNA Pre-anesthesia Checklist: Patient identified, Emergency Drugs available, Suction available and Patient being monitored Patient Re-evaluated:Patient Re-evaluated prior to induction Oxygen Delivery Method: Circle System Utilized Preoxygenation: Pre-oxygenation with 100% oxygen Induction Type: IV induction, Rapid sequence and Cricoid Pressure applied Laryngoscope Size: Glidescope and 4 (elective glidescope due to emergent case/unresulted COVID test) Grade View: Grade I Tube type: Oral Tube size: 7.5 mm Number of attempts: 1 Airway Equipment and Method: Stylet and Oral airway Placement Confirmation: ETT inserted through vocal cords under direct vision,  positive ETCO2 and breath sounds checked- equal and bilateral Secured at: 24 cm Tube secured with: Tape Dental Injury: Teeth and Oropharynx as per pre-operative assessment

## 2019-03-10 NOTE — Progress Notes (Signed)
Pt somewhat agitated in bed and constantly moving. ART line has become displaced and is no longer functional. Paged Dr. Estanislado Pandy and made him aware, given verbal order to discontinue line. Also received verbal order to restrain right lower extremity so pt will keep leg straight in bed per order. Groin site still level 0, and pulses dopplerable in lower extremity. Will continue to monitor. Fortino Sic, RN, BSN 03/10/2019 9:11 PM

## 2019-03-10 NOTE — ED Provider Notes (Addendum)
HPI: Patient presents as a code stroke. Was seen and evaluated at the bridge on arrival. History obtained by EMS, and from family via EMS. Level 5 caveat secondary to the patient's minimally interactive status, neurologic deficits/acuity of condition.  Apparently the patient was seen to have confusion, initially, but subsequently developed weakness, lack of interactivity. Patient went home initially, but after he progressed family members called EMS for transport.  Past medical history unavailable secondary to acuity of condition  Social history unavailable secondary to acuity of condition  Review of systems unavailable secondary to acuity of condition, level 5 caveat.  Physical Exam  Constitutional:  Unwell appearing thin elderly male noninteractive  HENT:  Head: Normocephalic and atraumatic.  Eyes: Conjunctivae are normal. Right eye exhibits no discharge. Left eye exhibits no discharge.  Pulmonary/Chest: Effort normal. No respiratory distress.  Abdominal: He exhibits no distension.  Musculoskeletal:        General: No deformity.  Neurological:  Patient essentially noninteractive with right-sided gaze, left-sided neglect, not following commands reliably.  Psychiatric:  Impaired cognition  Nursing note and vitals reviewed.   After the patient's initial assessment for airway protection he was taken expeditiously to the CT scan. Case discussed with our neurology colleagues. After initial CT findings were unremarkable in terms of bleed, mass, there was concern for perfusion deficit, requiring transfer to interventional radiology. CT results available in chart   EKG Interpretation  Date/Time:  Monday March 10 2019 15:02:29 EDT Ventricular Rate:  85 PR Interval:    QRS Duration: 99 QT Interval:  358 QTC Calculation: 426 R Axis:   34 Text Interpretation:  Sinus rhythm LAE, consider biatrial enlargement Borderline repolarization abnormality Abnormal ECG Confirmed by Carmin Muskrat [id number]) on 03/10/2019 3:34:04 PM      This adult male presents as a code stroke, with hemineglect, is found to have CT abnormalities concerning for acute perfusion deficiency and required emergent transfer to interventional radiology.   Carmin Muskrat, MD 03/10/19 1534  CRITICAL CARE Performed by: Carmin Muskrat Total critical care time: 35 minutes Critical care time was exclusive of separately billable procedures and treating other patients. Critical care was necessary to treat or prevent imminent or life-threatening deterioration. Critical care was time spent personally by me on the following activities: development of treatment plan with patient and/or surrogate as well as nursing, discussions with consultants, evaluation of patient's response to treatment, examination of patient, obtaining history from patient or surrogate, ordering and performing treatments and interventions, ordering and review of laboratory studies, ordering and review of radiographic studies, pulse oximetry and re-evaluation of patient's condition.    Carmin Muskrat, MD 03/24/19 [phone]

## 2019-03-10 NOTE — Consult Note (Signed)
INR. 67 Y RT H M MRRS 0 :LSW 930 am. New onset rt gaze deviation and Lt sided weakness and lt sided neglect. CT Brain No ICH ASPECTS 10. 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 Stump of a dominant  RT PCOM noted.Marland Kitchen Option of  Endovascular revascularization of RT PCOM/RT PCA D/W spouse and  His son over the phone. Reasons,procedure alternatives reviewed. Risks of ICH of 10 % ,worsening neuro deficit,death ,inability to revascularize and vascular injury discussed. Spouse and son expressed understanding and provided witnessed consent to proceed with endovascular treatment. S.Yarexi Pawlicki MD

## 2019-03-10 NOTE — Progress Notes (Signed)
Pt intubated and sedated, unable to assess NIH. Pt under the care of anesthesia at this time

## 2019-03-10 NOTE — Procedures (Signed)
S/P 4 vessel cerebral arteriogram followed by completev  Revascularization  Of RT PCOM and RT PCA with x 1 pass with 31mm x 86mm embotrap retriver device achieving a TICI 2b revascularization. 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

## 2019-03-10 NOTE — Consult Note (Signed)
INR. Post treatment   CT Brain shows no gross hemorrahsge or mass effect. Faint contrast  stain noted in the Lt post putamen and caudate head. Patient extubated. Obeys simple commands appropriately. Able to bend lt knee against gravity. Minimal movement in Lt UE at this time. RT groin soft.  No hematoma. Distal pulses all dopplerable bilaterally.. 4F angioseal applied to RT  CFA  Puncture . S.Tritia Endo MD

## 2019-03-10 NOTE — Transfer of Care (Signed)
Immediate Anesthesia Transfer of Care Note  Patient: Joe Perry  Procedure(s) Performed: IR WITH ANESTHESIA/CODE STROKE (N/A )  Patient Location: PACU  Anesthesia Type:General  Level of Consciousness: awake, alert  and patient cooperative  Airway & Oxygen Therapy: Patient Spontanous Breathing and Patient connected to nasal cannula oxygen  Post-op Assessment: Report given to RN and Post -op Vital signs reviewed and stable  Post vital signs: Reviewed and stable  Last Vitals:  Vitals Value Taken Time  BP    Temp    Pulse    Resp    SpO2      Last Pain:  Vitals:   03/10/19 1505  TempSrc:   PainSc: 0-No pain         Complications: No apparent anesthesia complications

## 2019-03-10 NOTE — ED Triage Notes (Signed)
Pt arrives via EMS with reports of right sided gaze and AMS with LSN at 9:30. Pt presents with left sided neglect.

## 2019-03-11 ENCOUNTER — Encounter (HOSPITAL_COMMUNITY): Payer: Self-pay | Admitting: Radiology

## 2019-03-11 ENCOUNTER — Inpatient Hospital Stay (HOSPITAL_COMMUNITY): Payer: Medicare Other

## 2019-03-11 DIAGNOSIS — I639 Cerebral infarction, unspecified: Secondary | ICD-10-CM

## 2019-03-11 LAB — BASIC METABOLIC PANEL
Anion gap: 8 (ref 5–15)
BUN: 7 mg/dL — ABNORMAL LOW (ref 8–23)
CO2: 26 mmol/L (ref 22–32)
Calcium: 8.6 mg/dL — ABNORMAL LOW (ref 8.9–10.3)
Chloride: 105 mmol/L (ref 98–111)
Creatinine, Ser: 0.82 mg/dL (ref 0.61–1.24)
GFR calc Af Amer: >60 mL/min (ref >60)
GFR calc non Af Amer: >60 mL/min (ref >60)
Glucose, Bld: 169 mg/dL — ABNORMAL HIGH (ref 70–99)
Potassium: 3.5 mmol/L (ref 3.5–5.1)
Sodium: 139 mmol/L (ref 135–145)

## 2019-03-11 LAB — GLUCOSE, CAPILLARY
Glucose-Capillary: 165 mg/dL — ABNORMAL HIGH (ref 70–99)
Glucose-Capillary: 177 mg/dL — ABNORMAL HIGH (ref 70–99)
Glucose-Capillary: 144 mg/dL — ABNORMAL HIGH (ref 70–99)
Glucose-Capillary: 94 mg/dL (ref 70–99)
Glucose-Capillary: 172 mg/dL — ABNORMAL HIGH (ref 70–99)

## 2019-03-11 LAB — CBC WITH DIFFERENTIAL/PLATELET
Abs Immature Granulocytes: 0.01 10*3/uL (ref 0.00–0.07)
Basophils Absolute: 0 10*3/uL (ref 0.0–0.1)
Basophils Relative: 0 %
Eosinophils Absolute: 0.2 10*3/uL (ref 0.0–0.5)
Eosinophils Relative: 2 %
HCT: 38.2 % — ABNORMAL LOW (ref 39.0–52.0)
Hemoglobin: 12.9 g/dL — ABNORMAL LOW (ref 13.0–17.0)
Immature Granulocytes: 0 %
Lymphocytes Relative: 26 %
Lymphs Abs: 1.6 10*3/uL (ref 0.7–4.0)
MCH: 31 pg (ref 26.0–34.0)
MCHC: 33.8 g/dL (ref 30.0–36.0)
MCV: 91.8 fL (ref 80.0–100.0)
Monocytes Absolute: 0.5 10*3/uL (ref 0.1–1.0)
Monocytes Relative: 8 %
Neutro Abs: 3.9 10*3/uL (ref 1.7–7.7)
Neutrophils Relative %: 64 %
Platelets: 202 10*3/uL (ref 150–400)
RBC: 4.16 MIL/uL — ABNORMAL LOW (ref 4.22–5.81)
RDW: 12.2 % (ref 11.5–15.5)
WBC: 6.1 10*3/uL (ref 4.0–10.5)
nRBC: 0 % (ref 0.0–0.2)

## 2019-03-11 LAB — LIPID PANEL
Cholesterol: 159 mg/dL (ref 0–200)
HDL: 45 mg/dL (ref >40)
LDL Cholesterol: 101 mg/dL — ABNORMAL HIGH (ref 0–99)
Total CHOL/HDL Ratio: 3.5 RATIO
Triglycerides: 67 mg/dL (ref <150)
VLDL: 13 mg/dL (ref 0–40)

## 2019-03-11 LAB — ECHOCARDIOGRAM COMPLETE
Height: 69 in
Weight: 3174.62 oz

## 2019-03-11 LAB — HEMOGLOBIN A1C
Hgb A1c MFr Bld: 9.8 % — ABNORMAL HIGH (ref 4.8–5.6)
Mean Plasma Glucose: 234.56 mg/dL

## 2019-03-11 NOTE — Progress Notes (Signed)
Report called to [facility], Therapist, sports. Wife notified of pt's new room #.

## 2019-03-11 NOTE — Evaluation (Signed)
Clinical/Bedside Swallow Evaluation Patient Details  Name: Joe Perry MRN: [id number] Date of Birth: 01-27-1951  Today's Date: 03/11/2019 Time: SLP Start Time (ACUTE ONLY): 1015 SLP Stop Time (ACUTE ONLY): 1030 SLP Time Calculation (min) (ACUTE ONLY): 15 min  Past Medical History:  Past Medical History:  Diagnosis Date  . Diabetes mellitus without complication ([place])   . Hypertension   . Stroke [facility])    Past Surgical History:  Past Surgical History:  Procedure Laterality Date  . RADIOLOGY WITH ANESTHESIA N/A 03/10/2019   Procedure: IR WITH ANESTHESIA/CODE STROKE;  Surgeon: Radiologist, Medication, MD;  Location: [facility];  Service: Radiology;  Laterality: N/A;   HPI:  68 year old male admitted 03/10/2019 with left weakness, unsteady gait, right gaze, left hemianopsia. PMH: CVA, HTN, DM. CT negative. MRI pending   Assessment / Plan / Recommendation Clinical Impression  Oral care completed with suction. Pt presents with adequate dentition. Speech has reduced intelligibility, largely due to low intensity, rapid rate, and accent rather than dysarthria. Following oral care, pt was given ice chips, thin liquid, nectar thick liquid, and puree trials. Immediate cough response noted following trials of thin liquid. Pt exhibited no oral residue or anterior leakage of any consistency, and did not exhibit overt s/s aspiration on nectar thick liquids or puree consistencies. Solid textures were not provided at this time. Pt exhibits significant left neglect and appropriate management of solids was questionable. SLP will continue to follow to assess readiness to advance solids/liquids and/or perform instrumental assessment.    SLP Visit Diagnosis: Dysphagia, unspecified (R13.10)    Aspiration Risk  Moderate aspiration risk    Diet Recommendation Nectar-thick liquid;Dysphagia 1 (Puree)   Liquid Administration via: Cup;Straw Medication Administration: Crushed with puree Supervision: Staff to assist  with self feeding;Full supervision/cueing for compensatory strategies Compensations: Minimize environmental distractions;Slow rate;Small sips/bites;Lingual sweep for clearance of pocketing Postural Changes: Seated upright at 90 degrees;Remain upright for at least 30 minutes after po intake    Other  Recommendations Oral Care Recommendations: Oral care BID Other Recommendations: Have oral suction available;Order thickener from pharmacy   Follow up Recommendations Inpatient Rehab      Frequency and Duration min 2x/week  2 weeks       Prognosis Prognosis for Safe Diet Advancement: Good Barriers to Reach Goals: Cognitive deficits;Severity of deficits      Swallow Study   General Date of Onset: 03/10/19 HPI: 68 year old male admitted 03/10/2019 with left weakness, unsteady gait, right gaze, left hemianopsia. PMH: CVA, HTN, DM. CT negative. MRI pending Type of Study: Bedside Swallow Evaluation Previous Swallow Assessment: none Diet Prior to this Study: NPO Respiratory Status: Room air Behavior/Cognition: Alert;Requires cueing;Pleasant mood Oral Cavity Assessment: Within Functional Limits Oral Care Completed by SLP: Yes Oral Cavity - Dentition: Adequate natural dentition Self-Feeding Abilities: Total assist Patient Positioning: Upright in bed Baseline Vocal Quality: Normal Volitional Cough: Weak Volitional Swallow: Unable to elicit    Oral/Motor/Sensory Function Overall Oral Motor/Sensory Function: Within functional limits   Ice Chips Ice chips: Within functional limits Presentation: Spoon   Thin Liquid Thin Liquid: Impaired Presentation: Cup Pharyngeal  Phase Impairments: Suspected delayed Swallow;Cough - Immediate    Nectar Thick Nectar Thick Liquid: Within functional limits Presentation: Cup;Straw   Honey Thick Honey Thick Liquid: Not tested   Puree Puree: Within functional limits Presentation: [place]. Quentin Ore [facility], CCC-SLP Speech Language  Pathologist [phone]  Solid: Not tested      Colon Flattery  [place]ark 03/11/2019,12:12 PM

## 2019-03-11 NOTE — Progress Notes (Signed)
Pt too sleepy to safely proceed with [place] study. Speech eval ordered.

## 2019-03-11 NOTE — Evaluation (Signed)
Speech Language Pathology Evaluation Patient Details Name: WATSON ROBARGE MRN: [id number] DOB: 1951-03-29 Today's Date: 03/11/2019 Time: 8850-2774 SLP Time Calculation (min) (ACUTE ONLY): 15 min  Problem List:  Patient Active Problem List   Diagnosis Date Noted  . Stroke (cerebrum) ([place]) 03/10/2019  . 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 Abnormality of gait   . Spasticity   . Gait disturbance, post-stroke   . 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 CVA (cerebral infarction) 01/31/2016  . Type 2 diabetes mellitus without complication, without long-term current use of insulin ([place]) 01/31/2016  . Essential hypertension 01/31/2016  . History of CVA (cerebrovascular accident) 01/31/2016  . 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 Diabetes mellitus without complication ([place])   . Hypertension   . Stroke [facility])    Past Surgical History:  Past Surgical History:  Procedure Laterality Date  . RADIOLOGY WITH ANESTHESIA N/A 03/10/2019   Procedure: IR WITH ANESTHESIA/CODE STROKE;  Surgeon: Radiologist, Medication, MD;  Location: [facility];  Service: Radiology;  Laterality: N/A;   HPI:  68 year old male admitted 03/10/2019 with left weakness, unsteady gait, right gaze, left hemianopsia. PMH: CVA, HTN, DM. CT negative. MRI pending   Assessment / Plan / Recommendation Clinical Impression  The Mini-Mental State Examination (MMSE) was administered. Pt scored 7/30 (n=>24/30) indicating severe cognitive linguistic impairment. Points were lost on orientation, attention, delayed recall, object naming, sentence repetition, following multistep directions, reading comprehension, and writing/drawing. Pt was able to repeat 3 unrelated words, but recalled only 1 minutes later. Difficulty with naming common objects and answering questions. Speech was difficult to understand, due to low intensity rapid rate  and accent. Pt exhibits marked left neglect and perseveration of responses. He was accurate for "[place]", but could not provide his birthdate ("25th of this month - Thursday"). He stated the year was "[birth year]" and the month was "[birth year]....20-something". Continued ST intervention is recommended, targeting cognitive linguistic impairments. More indepth diagnostic treatment will be completed as sessions progress. Anticipate need for 24 hour supervision at DC given severity of impairments.    SLP Assessment  SLP Recommendation/Assessment: Patient needs continued Speech Language Pathology Services SLP Visit Diagnosis: Cognitive communication deficit (R41.841)    Follow Up Recommendations  Inpatient Rehab    Frequency and Duration min 2x/week  2 weeks      SLP Evaluation Cognition  Overall Cognitive Status: (Cognitive Linguistic deficits noted) Orientation Level: Oriented to person;Disoriented to place;Disoriented to time;Disoriented to situation Attention: Focused;Sustained Focused Attention: Appears intact Sustained Attention: Impaired Sustained Attention Impairment: Verbal basic;Functional basic Memory: Impaired Memory Impairment: Storage deficit;Retrieval deficit;Decreased recall of new information Awareness: Impaired Awareness Impairment: Intellectual impairment       Comprehension  Auditory Comprehension Overall Auditory Comprehension: Impaired Commands: Impaired One Step Basic Commands: 75-100% accurate Two Step Basic Commands: 50-74% accurate Interfering Components: Attention Visual Recognition/Discrimination Discrimination: Not tested Reading Comprehension Reading Status: Not tested    Expression Expression Primary Mode of Expression: Verbal Verbal Expression Overall Verbal Expression: Impaired Initiation: No impairment Automatic Speech: Name;Counting;Social Response Level of Generative/Spontaneous Verbalization: Word Repetition: Impaired Level of Impairment: Phrase  level Naming: Impairment Responsive: 0-25% accurate Confrontation: Impaired Convergent: 0-24% accurate Divergent: 0-24% accurate Verbal Errors: Perseveration Pragmatics: Impairment Impairments: Eye contact;Abnormal affect;Monotone Interfering Components: Attention;Speech intelligibility Written Expression Dominant Hand: Right Written Expression: Not tested   Oral / Motor  Oral Motor/Sensory Function Overall Oral Motor/Sensory Function: Within functional limits  Motor Speech Overall Motor Speech: Appears within functional limits for tasks assessed   GO                    Celia B. Quentin Ore [facility], CCC-SLP Speech Language Pathologist [phone]  [name] 03/11/2019, 12:28 PM

## 2019-03-11 NOTE — Progress Notes (Signed)
OT Cancellation Note  Patient Details Name: Joe Perry MRN: [id number] DOB: [birth year]   Cancelled Treatment:    Reason Eval/Treat Not Completed: Patient at procedure or test/ unavailable.   Pt off floor for procedure.  Lucille Passy, OTR/L Acute Rehabilitation Services Pager [phone] Office [phone]   Lucille Passy M 03/11/2019, 2:04 PM

## 2019-03-11 NOTE — Progress Notes (Signed)
Pt had camera visit with wife.

## 2019-03-11 NOTE — Progress Notes (Signed)
  Echocardiogram 2D Echocardiogram has been performed.  Joe Perry 03/11/2019, 8:59 AM

## 2019-03-11 NOTE — Progress Notes (Signed)
STROKE TEAM PROGRESS NOTE   INT I have personally reviewed history of presenting illness with the patient and review of electronic medical records as well as imaging films in PACS.  He developed with sudden onset of left-sided weakness and presented outside time window for TPA.  Underwent successful mechanical thrombectomy of the right posterior cerebral artery.  He has remained stable overnight and in fact has obtain improvement in his left hemiparesis.  He still has persistent right gaze and left field cut.  Blood pressure has been adequately controlled.  Vitals:   03/11/19 0630 03/11/19 0700 03/11/19 0800 03/11/19 0900  BP: 115/64 110/69 116/62 131/75  Pulse: 73 70 70 65  Resp: 15 15 15 13   Temp:   98.5 F (36.9 C)   TempSrc:   Oral   SpO2: 91% 93% 96% 97%  Weight:      Height:        CBC:  Recent Labs  Lab 03/10/19 1441 03/10/19 1448  WBC 6.1  --   NEUTROABS 3.4  --   HGB 13.9 14.6  HCT 42.1 43.0  MCV 92.3  --   PLT 206  --     Basic Metabolic Panel:  Recent Labs  Lab 03/10/19 1441 03/10/19 1448  NA 136 136  K 4.1 4.1  CL 102 103  CO2 24  --   GLUCOSE 242* 245*  BUN 8 11  CREATININE 0.88 0.70  CALCIUM 9.2  --    Lipid Panel:     Component Value Date/Time   CHOL 118 01/31/2016 0858   TRIG 96 01/31/2016 0858   HDL 48 01/31/2016 0858   CHOLHDL 2.5 01/31/2016 0858   VLDL 19 01/31/2016 0858   LDLCALC 51 01/31/2016 0858   HgbA1c:  Lab Results  Component Value Date   HGBA1C 8.2 (H) 02/01/2016   Urine Drug Screen:     Component Value Date/Time   LABOPIA NONE DETECTED 11/13/2018 1217   COCAINSCRNUR NONE DETECTED 11/13/2018 1217   LABBENZ NONE DETECTED 11/13/2018 1217   AMPHETMU NONE DETECTED 11/13/2018 1217   THCU NONE DETECTED 11/13/2018 1217   LABBARB NONE DETECTED 11/13/2018 1217    Alcohol Level No results found for: [place] Head Code Stroke Wo Contrast 03/10/2019 1454 1. No acute finding by CT. Extensive chronic small-vessel ischemic  changes throughout the brain as outlined above. Old right frontal infarction. 2. ASPECTS is 10.   Ct Angio Head W Or Wo Contrast Ct Angio Neck W Or Wo Contrast Ct Cerebral Perfusion W Contrast 03/10/2019 1517 1. Fetal type right PCA with proximal occlusion leading to 19 cc of penumbra and no infarct by CT perfusion. There may be a right P1 segment that is also occluded. 2. 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 3. Subjectively advanced left cavernous ICA stenosis. 4. 30-40% atheromatous narrowing at the right ICA bulb.   CT angio 03/13/2019 S/P 4 vessel cerebral arteriogram followed by completev  Revascularization  Of RT PCOM and RT PCA with x 1 pass with 85mm x 37mm embotrap retriver device achieving a TICI 2b revascularization. 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 . Afebrile. Head is nontraumatic. Neck is supple without bruit.    Cardiac exam no murmur or gallop. Lungs are clear to auscultation. Distal pulses are well felt.  Neurological Exam :  Patient is awake alert is oriented to time place and person.  He has mild dysarthria but no aphasia.  He has diminished attention registration and recall.  He has right gaze preference.  He is unable to look to the left past midline.  He has dense left homonymous hemianopsia.  Pupils are equal reactive.  Fundi not visualized.  He has mild left lower facial weakness.  Tongue midline.  Motor system exam shows mild left hemiparesis 4/5 strength with weakness of left grip and intrinsic hand muscles.  He orbits right over left upper extremity.  Tone is slightly diminished on the left.  Deep tendon flexes are symmetric plantars are downgoing.  Sensation is diminished on the left compared to the right.  Gait not  tested. ASSESSMENT/PLAN [name] is a 68 y.o. male with history of CVA, HTN, DB presenting with L sided weakness and speech difficulty.   Stroke:   R PCA infarct s/p IR w/ TICI2b revascularization, secondary to large vessel disease source  Code Stroke CT head No acute abnormality. Extensive small vessel disease. Old R frontal infarct. ASPECTS 10.     CTA head & neck fetal R PCA. ? R P1 occlusion. Small BA w/ high grade atheromatous narrowing. R VA ends in PICA. L ICA stenosis. R ICA 30-40%.  CT perfusion no infarct, 19 cc penumbra  Cerebral angio TICI 2b revascularization R PCOM and P PCA w/ embo trap. Underlying large vessel atherosclerosis   Post IR CT no gross ICH. Faint contrast stain L post putamen and caudate head.  MRI  pending  2D Echo ejection fraction 60 to 65%.  No wall motion abnormalities.  HIV pending  LDL 101 mg percent.  HgbA1c 9.8  SCDs for VTE prophylaxis  aspirin 81 mg daily and clopidogrel 75 mg daily prior to admission, now on aspirin 325 mg daily and clopidogrel 75 mg daily. Continue DAPT at d/c  Therapy recommendations:  pending . Ok to be OOB  Disposition:  pending   Hypertension  Home meds:  None listed  Stable BP goal per post tPA protocol x 24h following tPA administration . Long-term BP goal normotensive  Hyperlipidemia  Home meds:  No statin listed  LDL pending, goal < 70  Add statin if LDL =/> 70  Diabetes type II   Home meds:  glucophage 1000 bid  HgbA1c pending, goal < 7.0  CBGs  SSI  Dysphagia . Secondary to stroke . NPO . Too sleepy for swallow eval . Speech ordered on board  Other Stroke Risk Factors  Advanced age  Overweight, Body mass index is 29.3 kg/m., recommend weight loss, diet and exercise as appropriate   Hx stroke/TIA  11/2018 - few tiny white matter infarcts R frontal and parietal loves. No LVO. Extensive SVD.  01/2016 - old infarcts B BG, thalami, L cerebellum. Mild atrophy,  SVD  Other Active Problems    Hospital day # 1  I have personally obtained history,examined this patient, reviewed notes, independently viewed imaging studies, participated in medical decision making and plan of care.ROS completed by me personally and pertinent positives fully documented  I have made any additions or clarifications directly to the above note.  He presented with right PCA occlusion and was treated with mechanical thrombectomy and has shown some improvement.  Continue close ICU monitoring and neuro checks as per post interventional protocol.  Strict blood pressure control.  Mobilize out of bed.  Physical occupational speech therapy consults.  Aspirin and Plavix for 3 months given intracranial atherosclerosis.  Aggressive risk factor modification.  Long discussion with patient and answered questions.  Discussed with Dr. Estanislado Pandy. This patient is critically ill and at significant risk of neurological worsening, death and care requires constant monitoring of vital signs, hemodynamics,respiratory and cardiac monitoring, extensive review of multiple databases, frequent neurological assessment, discussion with family, other specialists and medical decision making of high complexity.I have made any additions or clarifications directly to the above note.This critical care time does not reflect procedure time, or teaching time or supervisory time of PA/NP/Med Resident etc but could involve care discussion time.  I spent 30 minutes of neurocritical care time  in the care of  this patient.      Antony Contras, MD Medical Director [place] Pager: [phone] 03/11/2019 2:13 PM   To contact Stroke Continuity provider, please refer to [email or web address] After hours, contact General Neurology

## 2019-03-11 NOTE — Progress Notes (Signed)
PT Cancellation Note  Patient Details Name: CAMRY THEISS MRN: [id number] DOB: Jun 17, 1951   Cancelled Treatment:    Reason Eval/Treat Not Completed: Patient at procedure or test/unavailable Pt off floor. Will follow.   Marguarite Arbour A Loneta Tamplin 03/11/2019, 2:04 PM Wray Kearns, PT, DPT Acute Rehabilitation Services Pager [phone] Office [phone]

## 2019-03-11 NOTE — Progress Notes (Signed)
Referring Physician(s): Code Stroke- Greta Doom  Supervising Physician: Luanne Bras  Patient Status:  [facility] - In-pt  Chief Complaint: None  Subjective:  Right PCOM and right PCA occlusions s/p emergent mechanical thrombectomy achieving a TICI 2b revascularization 03/10/2019 by Dr. Estanislado Pandy. Patient laying in bed resting. He opens eyes to voice and follows some simple commands (lifts bilateral arms on unable to smile/protude tongue on command). Right groin incision c/d/i.   Allergies: Patient has no known allergies.  Medications: Prior to Admission medications   Medication Sig Start Date End Date Taking? Authorizing Provider  aspirin EC 81 MG tablet Take 1 tablet (81 mg total) by mouth daily. 09/08/13  Yes Reyne Dumas, MD  clopidogrel (PLAVIX) 75 MG tablet Take 1 tablet (75 mg total) by mouth daily. 11/13/18  Yes Delo, Nathaneil Canary, MD  baclofen (LIORESAL) 10 MG tablet Take 5mg  (1/2 a tablet) twice a day and then 10mg  (a whole tablet at bedtime) Patient not taking: Reported on 11/13/2018 02/02/16   Barton Dubois, MD  metFORMIN (GLUCOPHAGE) 1000 MG tablet Take 1 tablet (1,000 mg total) by mouth 2 (two) times daily with a meal. Patient not taking: Reported on 11/13/2018 09/10/13   Reyne Dumas, MD     Vital Signs: BP 131/75    Pulse 65    Temp 98.5 F (36.9 C) (Oral)    Resp 13    Ht 5\' 9"  (1.753 m)    Wt 198 lb 6.6 oz (90 kg)    SpO2 97%    BMI 29.30 kg/m   Physical Exam Vitals signs and nursing note reviewed.  Constitutional:      General: He is not in acute distress.    Appearance: Normal appearance.  Pulmonary:     Effort: Pulmonary effort is normal. No respiratory distress.  Skin:    General: Skin is warm and dry.     Comments: Right groin incision soft without active bleeding or hematoma.  Neurological:     Mental Status: He is alert.     Comments: Alert and awake. He demonstrates gross aphasia and follows some simple commands (lifts bilateral arms on  command but unable to smile/protude tongue on command). PERRL bilaterally. Can spontaneously move all extremities. Distal pulses palpable bilaterally with Doppler.     Imaging: Ct Angio Head W Or Wo Contrast  Result Date: 03/10/2019 CLINICAL DATA:  Stroke-like symptoms EXAM: CT ANGIOGRAPHY HEAD AND NECK CT PERFUSION BRAIN TECHNIQUE: Multidetector CT imaging of the head and neck was performed using the standard protocol during bolus administration of intravenous contrast. Multiplanar CT image reconstructions and MIPs were obtained to evaluate the vascular anatomy. Carotid stenosis measurements (when applicable) are obtained utilizing NASCET criteria, using the distal internal carotid diameter as the denominator. Multiphase CT imaging of the brain was performed following IV bolus contrast injection. Subsequent parametric perfusion maps were calculated using RAPID software. CONTRAST:  150mL OMNIPAQUE IOHEXOL 350 MG/ML SOLN COMPARISON:  Brain MRI 11/13/2018 FINDINGS: CTA NECK FINDINGS Aortic arch: Normal.  Three vessel arch. Right carotid system: Motion artifact about the bifurcation. Low-density plaque at the bifurcation with 30-40% stenosis based on sagittal reformats. Negative for generalized beading. Left carotid system: Motion artifact at the distal common carotid and proximal ICA without ulceration. No flow limiting stenosis Vertebral arteries: No proximal subclavian flow limiting stenosis despite atherosclerosis. Small vertebral arteries in the setting of variant intracranial circulation. These vessels are diffusely patent in the neck. Skeleton: No acute or aggressive finding. Probable arrested aeration of the  sphenoid sinuses. Other neck: Limited assessment of the larynx and pharynx in the setting of artifact. Upper chest: Negative Review of the MIP images confirms the above findings CTA HEAD FINDINGS Anterior circulation: Motion artifact. There is atherosclerotic calcification along the carotid siphons  with a severe narrowing at the left anterior genu where there is a noncalcified plaque, quantification limited on this motion and small vessel size. Negative for branch occlusion or flow limiting stenosis. There is pending catheter angiogram Posterior circulation: Right vertebral artery ends in PICA with high-grade stenosis. There is extensive atherosclerotic irregularity of the basilar with areas of apparent flow gap proximally and distally. Poor flow is seen at the basilar tip, accentuated by small vessel size in the setting of bilateral fetal type PCA with right-sided occlusion. There is likely a right P1 that is occluded. Cannot exclude thrombus at the top of basilar. Venous sinuses: Patent Anatomic variants: As above Delayed phase: Not obtained Review of the MIP images confirms the above findings These results were called by telephone at the time of interpretation on 03/10/2019 at 3:08 pm to Dr. Roland Rack , who verbally acknowledged these results. CT Brain Perfusion Findings: ASPECTS: 10 CBF (<30%) Volume: 47mL Perfusion (Tmax>6.0s) volume: 12mL Mismatch Volume: 4mL IMPRESSION: 1. Fetal type right PCA with proximal occlusion leading to 19 cc of penumbra and no infarct by CT perfusion. There may be a right P1 segment that is also occluded. 2. 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 3. Subjectively advanced left cavernous ICA stenosis. 4. 30-40% atheromatous narrowing at the right ICA bulb. Electronically Signed   By: Monte Fantasia M.D.   On: 03/10/2019 15:17   Ct Angio Neck W Or Wo Contrast  Result Date: 03/10/2019 CLINICAL DATA:  Stroke-like symptoms EXAM: CT ANGIOGRAPHY HEAD AND NECK CT PERFUSION BRAIN TECHNIQUE: Multidetector CT imaging of the head and neck was performed using the standard protocol during bolus administration of intravenous contrast. Multiplanar CT image  reconstructions and MIPs were obtained to evaluate the vascular anatomy. Carotid stenosis measurements (when applicable) are obtained utilizing NASCET criteria, using the distal internal carotid diameter as the denominator. Multiphase CT imaging of the brain was performed following IV bolus contrast injection. Subsequent parametric perfusion maps were calculated using RAPID software. CONTRAST:  148mL OMNIPAQUE IOHEXOL 350 MG/ML SOLN COMPARISON:  Brain MRI 11/13/2018 FINDINGS: CTA NECK FINDINGS Aortic arch: Normal.  Three vessel arch. Right carotid system: Motion artifact about the bifurcation. Low-density plaque at the bifurcation with 30-40% stenosis based on sagittal reformats. Negative for generalized beading. Left carotid system: Motion artifact at the distal common carotid and proximal ICA without ulceration. No flow limiting stenosis Vertebral arteries: No proximal subclavian flow limiting stenosis despite atherosclerosis. Small vertebral arteries in the setting of variant intracranial circulation. These vessels are diffusely patent in the neck. Skeleton: No acute or aggressive finding. Probable arrested aeration of the sphenoid sinuses. Other neck: Limited assessment of the larynx and pharynx in the setting of artifact. Upper chest: Negative Review of the MIP images confirms the above findings CTA HEAD FINDINGS Anterior circulation: Motion artifact. There is atherosclerotic calcification along the carotid siphons with a severe narrowing at the left anterior genu where there is a noncalcified plaque, quantification limited on this motion and small vessel size. Negative for branch occlusion or flow limiting stenosis. There is pending catheter angiogram Posterior circulation: Right vertebral artery ends in PICA with high-grade stenosis. There is extensive atherosclerotic irregularity  of the basilar with areas of apparent flow gap proximally and distally. Poor flow is seen at the basilar tip, accentuated by small  vessel size in the setting of bilateral fetal type PCA with right-sided occlusion. There is likely a right P1 that is occluded. Cannot exclude thrombus at the top of basilar. Venous sinuses: Patent Anatomic variants: As above Delayed phase: Not obtained Review of the MIP images confirms the above findings These results were called by telephone at the time of interpretation on 03/10/2019 at 3:08 pm to Dr. Roland Rack , who verbally acknowledged these results. CT Brain Perfusion Findings: ASPECTS: 10 CBF (<30%) Volume: 104mL Perfusion (Tmax>6.0s) volume: 43mL Mismatch Volume: 91mL IMPRESSION: 1. Fetal type right PCA with proximal occlusion leading to 19 cc of penumbra and no infarct by CT perfusion. There may be a right P1 segment that is also occluded. 2. 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 3. Subjectively advanced left cavernous ICA stenosis. 4. 30-40% atheromatous narrowing at the right ICA bulb. Electronically Signed   By: Monte Fantasia M.D.   On: 03/10/2019 15:17   Ct Cerebral Perfusion W Contrast  Result Date: 03/10/2019 CLINICAL DATA:  Stroke-like symptoms EXAM: CT ANGIOGRAPHY HEAD AND NECK CT PERFUSION BRAIN TECHNIQUE: Multidetector CT imaging of the head and neck was performed using the standard protocol during bolus administration of intravenous contrast. Multiplanar CT image reconstructions and MIPs were obtained to evaluate the vascular anatomy. Carotid stenosis measurements (when applicable) are obtained utilizing NASCET criteria, using the distal internal carotid diameter as the denominator. Multiphase CT imaging of the brain was performed following IV bolus contrast injection. Subsequent parametric perfusion maps were calculated using RAPID software. CONTRAST:  181mL OMNIPAQUE IOHEXOL 350 MG/ML SOLN COMPARISON:  Brain MRI 11/13/2018 FINDINGS: CTA NECK FINDINGS Aortic arch:  Normal.  Three vessel arch. Right carotid system: Motion artifact about the bifurcation. Low-density plaque at the bifurcation with 30-40% stenosis based on sagittal reformats. Negative for generalized beading. Left carotid system: Motion artifact at the distal common carotid and proximal ICA without ulceration. No flow limiting stenosis Vertebral arteries: No proximal subclavian flow limiting stenosis despite atherosclerosis. Small vertebral arteries in the setting of variant intracranial circulation. These vessels are diffusely patent in the neck. Skeleton: No acute or aggressive finding. Probable arrested aeration of the sphenoid sinuses. Other neck: Limited assessment of the larynx and pharynx in the setting of artifact. Upper chest: Negative Review of the MIP images confirms the above findings CTA HEAD FINDINGS Anterior circulation: Motion artifact. There is atherosclerotic calcification along the carotid siphons with a severe narrowing at the left anterior genu where there is a noncalcified plaque, quantification limited on this motion and small vessel size. Negative for branch occlusion or flow limiting stenosis. There is pending catheter angiogram Posterior circulation: Right vertebral artery ends in PICA with high-grade stenosis. There is extensive atherosclerotic irregularity of the basilar with areas of apparent flow gap proximally and distally. Poor flow is seen at the basilar tip, accentuated by small vessel size in the setting of bilateral fetal type PCA with right-sided occlusion. There is likely a right P1 that is occluded. Cannot exclude thrombus at the top of basilar. Venous sinuses: Patent Anatomic variants: As above Delayed phase: Not obtained Review of the MIP images confirms the above findings These results were called by telephone at the time of interpretation on 03/10/2019 at 3:08 pm to Dr. Roland Rack , who verbally acknowledged these  results. CT Brain Perfusion Findings: ASPECTS: 10  CBF (<30%) Volume: 35mL Perfusion (Tmax>6.0s) volume: 31mL Mismatch Volume: 16mL IMPRESSION: 1. Fetal type right PCA with proximal occlusion leading to 19 cc of penumbra and no infarct by CT perfusion. There may be a right P1 segment that is also occluded. 2. 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 3. Subjectively advanced left cavernous ICA stenosis. 4. 30-40% atheromatous narrowing at the right ICA bulb. Electronically Signed   By: Monte Fantasia M.D.   On: 03/10/2019 15:17   Ct Head Code Stroke Wo Contrast  Result Date: 03/10/2019 CLINICAL DATA:  Code stroke.  Acute stroke presentation. EXAM: CT HEAD WITHOUT CONTRAST TECHNIQUE: Contiguous axial images were obtained from the base of the skull through the vertex without intravenous contrast. COMPARISON:  11/13/2018 FINDINGS: Brain: Extensive chronic small-vessel ischemic changes affect the pons, cerebellum, thalami, basal ganglia and cerebral hemispheric white matter. Old right frontal cortical and subcortical infarction. No sign of acute infarction, mass lesion, hemorrhage, hydrocephalus or extra-axial collection. Vascular: There is atherosclerotic calcification of the major vessels at the base of the brain. Skull: Negative Sinuses/Orbits: Clear/normal Other: None ASPECTS ([place] Stroke Program Early CT Score). Insult side unknown. - Ganglionic level infarction (caudate, lentiform nuclei, internal capsule, insula, M1-M3 cortex): 7 - Supraganglionic infarction (M4-M6 cortex): 3 Total score (0-10 with 10 being normal): 10 IMPRESSION: 1. No acute finding by CT. Extensive chronic small-vessel ischemic changes throughout the brain as outlined above. Old right frontal infarction. 2. ASPECTS is 10. 3. These results were communicated to Dr. Leonel Ramsay at 2:52 pmon 7/6/2020by text page via the [facility] messaging system. Electronically Signed   By: Nelson Chimes M.D.   On: 03/10/2019 14:54    Labs:  CBC: Recent Labs    11/13/18 1150 03/10/19 1441 03/10/19 1448 03/11/19 0946  WBC 9.4 6.1  --  6.1  HGB 13.6 13.9 14.6 12.9*  HCT 42.1 42.1 43.0 38.2*  PLT 189 206  --  202    COAGS: Recent Labs    11/13/18 1150 03/10/19 1441  INR 1.0 1.1  APTT 23* 28    BMP: Recent Labs    11/13/18 1150 11/13/18 1226 03/10/19 1441 03/10/19 1448  NA 138  --  136 136  K 3.9  --  4.1 4.1  CL 101  --  102 103  CO2 26  --  24  --   GLUCOSE 321*  --  242* 245*  BUN 14  --  8 11  CALCIUM 9.2  --  9.2  --   CREATININE 1.01 0.80 0.88 0.70  GFRNONAA >60  --  >60  --   GFRAA >60  --  >60  --     LIVER FUNCTION TESTS: Recent Labs    11/13/18 1150 03/10/19 1441  BILITOT 1.2 1.1  AST 20 18  ALT 22 16  ALKPHOS 62 49  PROT 6.9 7.0  ALBUMIN 3.7 3.8    Assessment and Plan:  Right PCOM and right PCA occlusions s/p emergent mechanical thrombectomy achieving a TICI 2b revascularization 03/10/2019 by Dr. Estanislado Pandy. Patient's condition stable- can move all extremities but demonstrates gross aphasia. Right groin incision stable. Further plans per neurology- appreciate and agree with management. Please call NIR with questions/concerns.   Electronically Signed: Earley Abide, PA-C 03/11/2019, 10:55 AM   I spent a total of 25 Minutes at the the patient's bedside AND on the patient's hospital floor or  unit, greater than 50% of which was counseling/coordinating care for right PCOM and right PCA occlusions s/p revascularization.

## 2019-03-11 NOTE — Progress Notes (Signed)
Pt confirms that his wife is Rosine Beat, not Sales executive (as listen in Epic). Password "[name]" established with wife who can be reached at [phone].

## 2019-03-12 ENCOUNTER — Encounter (HOSPITAL_COMMUNITY): Payer: Self-pay | Admitting: Interventional Radiology

## 2019-03-12 DIAGNOSIS — E663 Overweight: Secondary | ICD-10-CM | POA: Diagnosis present

## 2019-03-12 DIAGNOSIS — E785 Hyperlipidemia, unspecified: Secondary | ICD-10-CM | POA: Diagnosis present

## 2019-03-12 DIAGNOSIS — I69391 Dysphagia following cerebral infarction: Secondary | ICD-10-CM

## 2019-03-12 LAB — GLUCOSE, CAPILLARY
Glucose-Capillary: 169 mg/dL — ABNORMAL HIGH (ref 70–99)
Glucose-Capillary: 245 mg/dL — ABNORMAL HIGH (ref 70–99)
Glucose-Capillary: 105 mg/dL — ABNORMAL HIGH (ref 70–99)
Glucose-Capillary: 116 mg/dL — ABNORMAL HIGH (ref 70–99)

## 2019-03-12 LAB — HIV ANTIBODY (ROUTINE TESTING W REFLEX): HIV Screen 4th Generation wRfx: NONREACTIVE

## 2019-03-12 MED ORDER — LIVING WELL WITH DIABETES BOOK
Freq: Once | Status: AC
  Administered 2019-03-12: 17:00:00
  Filled 2019-03-12: qty 1

## 2019-03-12 NOTE — Anesthesia Postprocedure Evaluation (Signed)
Anesthesia Post Note  Patient: Joe Perry  Procedure(s) Performed: IR WITH ANESTHESIA/CODE STROKE (N/A )     Patient location during evaluation: PACU Anesthesia Type: General Level of consciousness: awake and alert Pain management: pain level controlled Vital Signs Assessment: post-procedure vital signs reviewed and stable Respiratory status: spontaneous breathing, nonlabored ventilation, respiratory function stable and patient connected to nasal cannula oxygen Cardiovascular status: blood pressure returned to baseline and stable Postop Assessment: no apparent nausea or vomiting Anesthetic complications: no    Last Vitals:  Vitals:   03/12/19 0403 03/12/19 0411  BP: (!) 151/110 (!) 156/97  Pulse: 74 69  Resp: 16   Temp: 36.6 C 36.6 C  SpO2: 97% 97%    Last Pain:  Vitals:   03/12/19 0411  TempSrc: Oral  PainSc: 0-No pain                 Taijon Vink

## 2019-03-12 NOTE — Plan of Care (Signed)
Pt progressing toward desired goal

## 2019-03-12 NOTE — Progress Notes (Signed)
STROKE TEAM PROGRESS NOTE   Subjective (interval history) Patient remains with left gaze palsy and field cut and mild left hemiparesis.  he has been seen by therapists who recommended inpatient rehab.  Vitals:   03/12/19 0403 03/12/19 0411 03/12/19 0700 03/12/19 1100  BP: (!) 151/110 (!) 156/97 137/88 (!) 153/96  Pulse: 74 69 63 87  Resp: 16  20 20   Temp: 97.8 F (36.6 C) 97.8 F (36.6 C) 98.5 F (36.9 C) (!) 97.5 F (36.4 C)  TempSrc: Oral Oral Oral Oral  SpO2: 97% 97% 100% 99%  Weight:      Height:        CBC:  Recent Labs  Lab 03/10/19 1441 03/10/19 1448 03/11/19 0946  WBC 6.1  --  6.1  NEUTROABS 3.4  --  3.9  HGB 13.9 14.6 12.9*  HCT 42.1 43.0 38.2*  MCV 92.3  --  91.8  PLT 206  --  195    Basic Metabolic Panel:  Recent Labs  Lab 03/10/19 1441 03/10/19 1448 03/11/19 0946  NA 136 136 139  K 4.1 4.1 3.5  CL 102 103 105  CO2 24  --  26  GLUCOSE 242* 245* 169*  BUN 8 11 7*  CREATININE 0.88 0.70 0.82  CALCIUM 9.2  --  8.6*   Lipid Panel:     Component Value Date/Time   CHOL 159 03/11/2019 0946   TRIG 67 03/11/2019 0946   HDL 45 03/11/2019 0946   CHOLHDL 3.5 03/11/2019 0946   VLDL 13 03/11/2019 0946   LDLCALC 101 (H) 03/11/2019 0946   HgbA1c:  Lab Results  Component Value Date   HGBA1C 9.8 (H) 03/11/2019   Urine Drug Screen:     Component Value Date/Time   LABOPIA NONE DETECTED 11/13/2018 1217   COCAINSCRNUR NONE DETECTED 11/13/2018 1217   LABBENZ NONE DETECTED 11/13/2018 1217   AMPHETMU NONE DETECTED 11/13/2018 1217   THCU NONE DETECTED 11/13/2018 1217   LABBARB NONE DETECTED 11/13/2018 1217    Alcohol Level No results found for: [place] Head Code Stroke Wo Contrast 03/10/2019 1454 1. No acute finding by CT. Extensive chronic small-vessel ischemic changes throughout the brain as outlined above. Old right frontal infarction. 2. ASPECTS is 10.   Ct Angio Head W Or Wo Contrast Ct Angio Neck W Or Wo Contrast Ct Cerebral Perfusion W  Contrast 03/10/2019 1517 1. Fetal type right PCA with proximal occlusion leading to 19 cc of penumbra and no infarct by CT perfusion. There may be a right P1 segment that is also occluded. 2. 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 3. Subjectively advanced left cavernous ICA stenosis. 4. 30-40% atheromatous narrowing at the right ICA bulb.   CT angio 03/10/2019 S/P 4 vessel cerebral arteriogram followed by completev  Revascularization  Of RT PCOM and RT PCA with x 1 pass with 81mm x 65mm embotrap retriver device achieving a TICI 2b revascularization. 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 Motion degraded study. 2. Ischemic infarct of the right posterior cerebral artery territory, roughly corresponding to the ischemic volume identified on the earlier CT perfusion scan. 3. No hemorrhage or mass effect. 4. Chronic ischemic microangiopathy and age advanced atrophy.  PHYSICAL EXAM  [name] not in distress. . Afebrile. Head is nontraumatic. Neck is supple without bruit.  Cardiac exam no murmur or gallop. Lungs are clear to auscultation. Distal pulses are well felt. Neurological Exam :  Patient is awake alert is oriented to time place and person.  He has mild dysarthria but no aphasia.  He has diminished attention registration and recall.  He has right gaze preference.  He is unable to look to the left past midline.  He has dense left homonymous hemianopsia.  Pupils are equal reactive.  Fundi not visualized.  He has mild left lower facial weakness.  Tongue midline.  Motor system exam shows mild left hemiparesis 4/5 strength with weakness of left grip and intrinsic hand muscles.  He orbits right over left upper extremity.  Tone is slightly diminished on the left.  Deep tendon flexes are symmetric  plantars are downgoing.  Sensation is diminished on the left compared to the right.  Gait not tested.  ASSESSMENT/PLAN Joe Perry is a 68 y.o. male with history of CVA, HTN, DB presenting with L sided weakness and speech difficulty.   Stroke:   R PCA infarct s/p IR w/ TICI2b revascularization, secondary to large vessel disease source  Code Stroke CT head No acute abnormality. Extensive small vessel disease. Old R frontal infarct. ASPECTS 10.     CTA head & neck fetal R PCA. ? R P1 occlusion. Small BA w/ high grade atheromatous narrowing. R VA ends in PICA. L ICA stenosis. R ICA 30-40%.  CT perfusion no infarct, 19 cc penumbra  Cerebral angio TICI 2b revascularization R PCOM and P PCA w/ embo trap. Underlying large vessel atherosclerosis   Post IR CT no gross ICH. Faint contrast stain L post putamen and caudate head.  MRI  R PCA infarct. Small vessel disease. Atrophy.   2D Echo ejection fraction 60 to 65%.  No wall motion abnormalities.  HIV neg LDL 101   HgbA1c 9.8  SCDs for VTE prophylaxis  aspirin 81 mg daily and clopidogrel 75 mg daily prior to admission, now on aspirin 325 mg daily and clopidogrel 75 mg daily. Continue DAPT at d/c  Therapy recommendations:  CIR - admissions coordinator following up with family  Disposition:  pending   Medically ready for rehab when bed available  Hypertension  Home meds:  None listed  Stable BP goal per typical AIS . Long-term BP goal normotensive  Hyperlipidemia  Home meds:  No statin listed  LDL pending, goal < 70  Add statin if LDL =/> 70  Diabetes type II   Home meds:  glucophage 1000 bid  HgbA1c 9.8, goal < 7.0  CBGs  SSI  Dysphagia . Secondary to stroke . Cleared for D1 nectar thick liquids . Speech ordered on board  Other Stroke Risk Factors  Advanced age  Overweight, Body mass index is 29.3 kg/m., recommend weight loss, diet and exercise as appropriate   Hx stroke/TIA  11/2018 - few tiny  white matter infarcts R frontal and parietal loves. No LVO. Extensive SVD.  01/2016 - old infarcts B BG, thalami, L cerebellum. Mild atrophy, SVD  Other Active Problems    Hospital day # 2  Continue aspirin and Plavix for intracranial atherosclerosis for 3 months followed by Plavix alone.  Aggressive risk factor modification.  Continue ongoing therapies.  Transfer to inpatient rehab when bed available.  He is medically stable for transfer  Antony Contras, [place] Pager: [phone] 03/12/2019 3:20 PM   To contact Stroke Continuity provider, please refer to [email or web address] After hours, contact  General Neurology

## 2019-03-12 NOTE — Progress Notes (Signed)
  Speech Language Pathology Treatment: Dysphagia  Patient Details Name: Joe Perry MRN: [id number] DOB: [birth year] Today's Date: 03/12/2019 Time: 8325-4982 SLP Time Calculation (min) (ACUTE ONLY): 20 min  Assessment / Plan / Recommendation Clinical Impression  Patient seen to address dysphagia goals with trials of thin liquids, mechanical soft/dysphagia 2 solids. Patient did not exhibit any overt s/s of aspiration or penetration with any of the boluses, but he did exhibit delayed swallow initiation with thin liquids and suspected discoordination of swallow with thin liquids as well. Although he required cues for setup and placement of cup of peaches in his left hand, he was then able to self-feed, without significant difficulty but was very impulsive. Initially he did not attend to or attempt to use his left hand, but after cues from SLP and trials, he was able to coordinate functional use of both hands to self-feed. (appears with motor apraxia). Patient did not have any oral residuals post mastication of soft solids and plan to upgrade from puree to Dys 2 (fine chop). Will continue to follow for readiness to further upgrade liquids vs objective swallow study.     HPI HPI: 68 year old male admitted 03/10/2019 with left weakness, unsteady gait, right gaze, left hemianopsia. PMH: CVA, HTN, DM. CT negative. MRI pending      SLP Plan  Continue with current plan of care       Recommendations  Diet recommendations: Dysphagia 2 (fine chop);Nectar-thick liquid Liquids provided via: Cup;Straw Medication Administration: Crushed with puree Supervision: Patient able to self feed;Full supervision/cueing for compensatory strategies;Staff to assist with self feeding Compensations: Minimize environmental distractions;Slow rate;Small sips/bites;Lingual sweep for clearance of pocketing Postural Changes and/or Swallow Maneuvers: Seated upright 90 degrees                Oral Care Recommendations:  Oral care BID Follow up Recommendations: Inpatient Rehab SLP Visit Diagnosis: Dysphagia, unspecified (R13.10) Plan: Continue with current plan of care       GO                [name] 03/12/2019, 4:48 PM   [name], MA, [facility] Pager: [phone]

## 2019-03-12 NOTE — Progress Notes (Signed)
Inpatient Rehab Admissions:  Inpatient Rehab Consult received.  I met with pt at the bedside for rehabilitation assessment and to discuss goals and expectations of an inpatient rehab admission.  With permission, AC spoke with pt's wife to discuss the program, insurance information, and confirm caregiver support. At this time, the wife is asking for [facility] to contact a friend to inquire if he has insurance. AC has left voicemail for this friend for call back. AC will continue to work the case and will update once there is more information. AC will also request assistance from financial counselor to begin Medicaid screening process if pt does not have insurance.   Joe Perry, OTR/L  Rehab Admissions Coordinator  [phone] 03/12/2019 12:38 PM

## 2019-03-12 NOTE — Evaluation (Signed)
Physical Therapy Evaluation Patient Details Name: Joe Perry MRN: [id number] DOB: 05/02/1951 Today's Date: 03/12/2019   History of Present Illness  Pt is a 68 yo male s/p  R PCOM and R PCA occlusion s/p emergent thrombectomy on 7/6. Pt admitteed with L sided weakness, R gaze perference, L homonymous hemianopsia. 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  Clinical Impression  Pt with significant awareness deficits, R gaze preference, visual deficits, and functional strength deficits in his left side.  He gave me a different hx than OT during their assessment.  He was independent PTA and would benefit from multi disciplinary post acute rehab.   PT to follow acutely for deficits listed below.      Follow Up Recommendations CIR    Equipment Recommendations  None recommended by PT    Recommendations for Other Services Rehab consult     Precautions / Restrictions Precautions Precautions: Fall Restrictions Weight Bearing Restrictions: No      Mobility  Bed Mobility               General bed mobility comments: pt was OOB in the chair.   Transfers Overall transfer level: Needs assistance Equipment used: None Transfers: Sit to/from Stand Sit to Stand: Min assist         General transfer comment: Min assist to power up and to steady once up  Ambulation/Gait Ambulation/Gait assistance: Mod assist Gait Distance (Feet): 45 Feet Assistive device: None Gait Pattern/deviations: Step-through pattern;Decreased step length - left;Decreased dorsiflexion - left Gait velocity: too fast to be safe   General Gait Details: Pt listing to the right, only turning right, not leaving enough space of him and myself in the hallway.  running into things on his left side and moving just as fast as PTA without awareness that it is unsafe to move this quickly      Modified Rankin (Stroke Patients Only) Modified Rankin (Stroke Patients Only) Pre-Morbid Rankin Score: No symptoms Modified  Rankin: Moderately severe disability     Balance Overall balance assessment: Needs assistance Sitting-balance support: Feet supported;Bilateral upper extremity supported Sitting balance-[name]: Fair     Standing balance support: No upper extremity supported Standing balance-[name]: Fair Standing balance comment: close supervision in static standing and pt may have his LEs supported by chair.                              Pertinent Vitals/Pain Pain Assessment: No/denies pain    Home Living Family/patient expects to be discharged to:: Private residence Living Arrangements: Alone Available Help at Discharge: Friend(s);Available PRN/intermittently Type of Home: House Home Access: Stairs to enter Entrance Stairs-Rails: Right;Left;Can reach both Entrance Stairs-Number of Steps: 2 Home Layout: One level Home Equipment: None Additional Comments: Not sure if he is an accurate historian.     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     Hand Dominance   Dominant Hand: Left    Extremity/Trunk Assessment   Upper Extremity Assessment Upper Extremity Assessment: Defer to OT evaluation LUE Deficits / Details: poor coordination and poor grip strength LUE Coordination: decreased fine motor;decreased gross motor    Lower Extremity Assessment Lower Extremity Assessment: LLE deficits/detail LLE Deficits / Details: left leg is functionally weaker than R leg.  Seated MMT was bassically equal with decreased coordination on left, increased foot drag  on left.      Cervical / Trunk Assessment Cervical / Trunk Assessment: Normal  Communication   Communication: Expressive difficulties  Cognition Arousal/Alertness: Awake/alert Behavior During Therapy: Flat affect;Impulsive Overall Cognitive Status: Impaired/Different from baseline Area of Impairment: Orientation;Attention;Memory;Following  commands;Safety/judgement;Awareness;Problem solving                 Orientation Level: Disoriented to;Place;Time;Situation Current Attention Level: Sustained Memory: Decreased recall of precautions;Decreased short-term memory Following Commands: Follows one step commands with increased time Safety/Judgement: Decreased awareness of safety;Decreased awareness of deficits Awareness: Intellectual Problem Solving: Slow processing;Decreased initiation;Requires tactile cues;Requires verbal cues;Difficulty sequencing General Comments: Pt with significant R gaze preference and L inattention.  He has no awareness of his left sided weakness, deficits, moves impulsively at the same speed as he likely did PTA.              Assessment/Plan    PT Assessment Patient needs continued PT services  PT Problem List Decreased strength;Decreased activity tolerance;Decreased balance;Decreased cognition;Decreased mobility;Decreased coordination;Decreased knowledge of use of DME;Decreased safety awareness;Decreased knowledge of precautions;Impaired sensation       PT Treatment Interventions DME instruction;Gait training;Stair training;Functional mobility training;Therapeutic activities;Therapeutic exercise;Balance training;Cognitive remediation;Neuromuscular re-education;Patient/family education    PT Goals (Current goals can be found in the Care Plan section)  Acute Rehab PT Goals Patient Stated Goal: wants to get back home and likes to walk PT Goal Formulation: With patient Time For Goal Achievement: 03/26/19 Potential to Achieve Goals: Good    Frequency Min 4X/week           AM-PAC PT "6 Clicks" Mobility  Outcome Measure Help needed turning from your back to your side while in a flat bed without using bedrails?: A Little Help needed moving from lying on your back to sitting on the side of a flat bed without using bedrails?: A Little Help needed moving to and from a bed to a chair (including  a wheelchair)?: A Little Help needed standing up from a chair using your arms (e.g., wheelchair or bedside chair)?: A Little Help needed to walk in hospital room?: A Lot Help needed climbing 3-5 steps with a railing? : A Lot 6 Click Score: 16    End of Session Equipment Utilized During Treatment: Gait belt Activity Tolerance: Patient tolerated treatment well Patient left: in chair;with call bell/phone within reach;with chair alarm set   PT Visit Diagnosis: Muscle weakness (generalized) (M62.81);Difficulty in walking, not elsewhere classified (R26.2);Hemiplegia and hemiparesis Hemiplegia - Right/Left: Left Hemiplegia - dominant/non-dominant: Dominant Hemiplegia - caused by: Cerebral infarction    Time: 3546-5681 PT Time Calculation (min) (ACUTE ONLY): 18 min   Charges:       Wells Guiles B. Nichlas Pitera, PT, DPT  Acute Rehabilitation [phone] pager [phone]) [phone] office  @ [facility]: [phone]   PT Evaluation $PT Eval Moderate Complexity: 1 Mod         03/12/2019, 10:48 AM

## 2019-03-12 NOTE — Evaluation (Signed)
Occupational Therapy Evaluation Patient Details Name: Joe Perry MRN: [id number] DOB: Jun 19, 1951 Today's Date: 03/12/2019    History of Present Illness Pt is a 68 yo male s/p  R PCOM and R PCA occlusion s/p emergent thrombectomy on 7/6. Pt admitteed with L sided weakness, R gaze perference, L homonymous hemianopsia. 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   Clinical Impression   Pt PTA: per chart, pt was independent with ADL and mobility. Pt currently performing ADL functional mobility with minA overall and ADL with modA overall. Pt with current deficits including L visual field deficits, L sided incoordination and poor strength in L hand,  And pt with R sided gaze preference. Pt with decreased use of LUE and used to stabilize objects. Pt requiring visual cues for self feeding tasks. Acute OT needs are in above deficits to progress with mobility and continue to assess and provide HEP for LUE. Pt would benefit from continued OT skilled services for ADL, mobility and safety. OT to follow acutely.     Follow Up Recommendations  CIR;Supervision/Assistance - 24 hour    Equipment Recommendations  None recommended by OT    Recommendations for Other Services Rehab consult     Precautions / Restrictions Precautions Precautions: Fall Restrictions Weight Bearing Restrictions: No      Mobility Bed Mobility Overal bed mobility: Needs Assistance Bed Mobility: Supine to Sit     Supine to sit: Min guard     General bed mobility comments: minguard for initial sitting balance  Transfers Overall transfer level: Needs assistance Equipment used: None Transfers: Sit to/from Stand Sit to Stand: Min assist         General transfer comment: Min assist to power up and to steady once up    Balance Overall balance assessment: Needs assistance Sitting-balance support: Feet supported;Bilateral upper extremity supported Sitting balance-[name]: Fair     Standing balance support: No  upper extremity supported Standing balance-[name]: Fair Standing balance comment: close supervision in static standing and pt may have his LEs supported by chair.                            ADL either performed or assessed with clinical judgement   ADL Overall ADL's : Needs assistance/impaired Eating/Feeding: Set up;Sitting   Grooming: Set up;Sitting   Upper Body Bathing: Minimal assistance;Sitting   Lower Body Bathing: Moderate assistance;Sitting/lateral leans;Sit to/from stand   Upper Body Dressing : Minimal assistance;Sitting;Cueing for sequencing;Cueing for safety   Lower Body Dressing: Moderate assistance;Sitting/lateral leans;Sit to/from stand;Cueing for safety;Cueing for sequencing   Toilet Transfer: Minimal assistance;BSC   Toileting- Clothing Manipulation and Hygiene: Moderate assistance;Sitting/lateral lean;Sit to/from stand       Functional mobility during ADLs: Minimal assistance;Cueing for safety;Cueing for sequencing General ADL Comments: set-upA  to minA for UB ADL; modA for LB ADL. Pt performing tasks with LUE with incoordination and poor visual deficits in L eye.     Vision Baseline Vision/History: No visual deficits Patient Visual Report: No change from baseline Vision Assessment?: Yes;Vision impaired- to be further tested in functional context Ocular Range of Motion: Restricted on the left Tracking/Visual Pursuits: Left eye does not track laterally;Impaired - to be further tested in functional context Visual Fields: Left visual field deficit Additional Comments: Pt scanning OTR moving in room and turning head to L when unable to see.     Perception     Praxis  Pertinent Vitals/Pain Pain Assessment: No/denies pain     Hand Dominance Right   Extremity/Trunk Assessment Upper Extremity Assessment Upper Extremity Assessment: Generalized weakness LUE Deficits / Details: poor coordination and poor grip strength LUE Coordination:  decreased fine motor;decreased gross motor   Lower Extremity Assessment Lower Extremity Assessment: Defer to PT evaluation LLE Deficits / Details: left leg is functionally weaker than R leg.  Seated MMT was bassically equal with decreased coordination on left, increased foot drag on left.     Cervical / Trunk Assessment Cervical / Trunk Assessment: Normal   Communication Communication Communication: Expressive difficulties   Cognition Arousal/Alertness: Awake/alert Behavior During Therapy: WFL for tasks assessed/performed Overall Cognitive Status: Impaired/Different from baseline Area of Impairment: Orientation;Memory;Safety/judgement;Awareness;Problem solving                 Orientation Level: Disoriented to;Place;Time;Situation Current Attention Level: Sustained Memory: Decreased recall of precautions;Decreased short-term memory Following Commands: Follows one step commands with increased time Safety/Judgement: Decreased awareness of deficits;Decreased awareness of safety Awareness: Intellectual Problem Solving: Slow processing;Decreased initiation;Difficulty sequencing;Requires verbal cues General Comments: Pt following commands   General Comments  Pt following commands    Exercises     Shoulder Instructions      Home Living Family/patient expects to be discharged to:: Private residence Living Arrangements: Spouse/significant other Available Help at Discharge: Friend(s);Available PRN/intermittently Type of Home: House Home Access: Stairs to enter [employer] of Steps: 2 Entrance Stairs-Rails: Right;Left;Can reach both Home Layout: One level               Home Equipment: None   Additional Comments: home info from pt who may not be reliable.  Lives With: Spouse    Prior Functioning/Environment Level of Independence: Independent        Comments: School RN        OT Problem List: Decreased strength;Decreased activity tolerance;Impaired  balance (sitting and/or standing);Decreased coordination;Pain;Decreased safety awareness      OT Treatment/Interventions: Self-care/ADL training;Therapeutic exercise;Neuromuscular education;Energy conservation;DME and/or AE instruction;Therapeutic activities;Patient/family education;Balance training;Cognitive remediation/compensation;Visual/perceptual remediation/compensation    OT Goals(Current goals can be found in the care plan section) Acute Rehab OT Goals Patient Stated Goal: wants to get back home OT Goal Formulation: With patient Time For Goal Achievement: 03/26/19 Potential to Achieve Goals: Good ADL Goals Pt Will Perform Grooming: with modified independence;standing Pt Will Perform Lower Body Dressing: with min guard assist;sit to/from stand Pt Will Transfer to Toilet: with supervision;ambulating;grab bars;regular height toilet Pt Will Perform Toileting - Clothing Manipulation and hygiene: with supervision;sit to/from stand Additional ADL Goal #1: Pt will perform OOB ADL with modified independence exhibiting fair balance.  OT Frequency: Min 2X/week   Barriers to D/C:            Co-evaluation              AM-PAC OT "6 Clicks" Daily Activity     Outcome Measure Help from another person eating meals?: A Little Help from another person taking care of personal grooming?: A Little Help from another person toileting, which includes using toliet, bedpan, or urinal?: A Little Help from another person bathing (including washing, rinsing, drying)?: A Lot Help from another person to put on and taking off regular upper body clothing?: A Little Help from another person to put on and taking off regular lower body clothing?: A Lot 6 Click Score: 16   End of Session Equipment Utilized During Treatment: Gait belt Nurse Communication: Mobility status  Activity Tolerance: Patient tolerated treatment well Patient  left: in chair;with call bell/phone within reach;with chair alarm  set  OT Visit Diagnosis: Unsteadiness on feet (R26.81);Muscle weakness (generalized) (M62.81)                Time: 9872-1587 OT Time Calculation (min): 33 min Charges:  OT General Charges $OT Visit: 1 Visit OT Evaluation $OT Eval Moderate Complexity: 1 Mod OT Treatments $Neuromuscular Re-education: 8-22 mins  Ebony Hail Harold Hedge) Marsa Aris OTR/L Acute Rehabilitation Services Pager: [phone] Office: [phone]   [id number] 03/12/2019, 1:57 PM

## 2019-03-13 ENCOUNTER — Encounter (HOSPITAL_COMMUNITY): Payer: Self-pay | Admitting: Radiology

## 2019-03-13 ENCOUNTER — Inpatient Hospital Stay (HOSPITAL_COMMUNITY): Payer: Medicare Other

## 2019-03-13 DIAGNOSIS — I6300 Cerebral infarction due to thrombosis of unspecified precerebral artery: Secondary | ICD-10-CM

## 2019-03-13 LAB — GLUCOSE, CAPILLARY
Glucose-Capillary: 155 mg/dL — ABNORMAL HIGH (ref 70–99)
Glucose-Capillary: 199 mg/dL — ABNORMAL HIGH (ref 70–99)
Glucose-Capillary: 123 mg/dL — ABNORMAL HIGH (ref 70–99)
Glucose-Capillary: 224 mg/dL — ABNORMAL HIGH (ref 70–99)

## 2019-03-13 NOTE — Progress Notes (Signed)
STROKE TEAM PROGRESS NOTE   Subjective (interval history) Patient is neurologically unchanged.  Continues to have right gaze preference, left hemianopsia and mild left-sided weakness.  Is medically stable for inpatient rehab awaiting insurance approval and bed availability  Vitals:   03/12/19 2001 03/12/19 2315 03/13/19 0403 03/13/19 0741  BP: (!) 166/89 132/89 (!) 159/92 133/85  Pulse: 80 74 82 89  Resp: 19 18 19 16   Temp: 98.2 F (36.8 C) 97.8 F (36.6 C) 98.4 F (36.9 C) 98.5 F (36.9 C)  TempSrc: Oral Oral Oral Oral  SpO2: 100% 98% 99% 98%  Weight:      Height:        CBC:  Recent Labs  Lab 03/10/19 1441 03/10/19 1448 03/11/19 0946  WBC 6.1  --  6.1  NEUTROABS 3.4  --  3.9  HGB 13.9 14.6 12.9*  HCT 42.1 43.0 38.2*  MCV 92.3  --  91.8  PLT 206  --  629    Basic Metabolic Panel:  Recent Labs  Lab 03/10/19 1441 03/10/19 1448 03/11/19 0946  NA 136 136 139  K 4.1 4.1 3.5  CL 102 103 105  CO2 24  --  26  GLUCOSE 242* 245* 169*  BUN 8 11 7*  CREATININE 0.88 0.70 0.82  CALCIUM 9.2  --  8.6*   Lipid Panel:     Component Value Date/Time   CHOL 159 03/11/2019 0946   TRIG 67 03/11/2019 0946   HDL 45 03/11/2019 0946   CHOLHDL 3.5 03/11/2019 0946   VLDL 13 03/11/2019 0946   LDLCALC 101 (H) 03/11/2019 0946   HgbA1c:  Lab Results  Component Value Date   HGBA1C 9.8 (H) 03/11/2019   Urine Drug Screen:     Component Value Date/Time   LABOPIA NONE DETECTED 11/13/2018 1217   COCAINSCRNUR NONE DETECTED 11/13/2018 1217   LABBENZ NONE DETECTED 11/13/2018 1217   AMPHETMU NONE DETECTED 11/13/2018 1217   THCU NONE DETECTED 11/13/2018 1217   LABBARB NONE DETECTED 11/13/2018 1217    Alcohol Level No results found for: [place] Code Stroke Wo Contrast 03/10/2019 1454 1. No acute finding by CT. Extensive chronic small-vessel ischemic changes throughout the brain as outlined above. Old right frontal infarction. 2. ASPECTS is 10.   Ct Angio Head W Or Wo  Contrast Ct Angio Neck W Or Wo Contrast Ct Cerebral Perfusion W Contrast 03/10/2019 1517 1. Fetal type right PCA with proximal occlusion leading to 19 cc of penumbra and no infarct by CT perfusion. There may be a right P1 segment that is also occluded. 2. 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 3. Subjectively advanced left cavernous ICA stenosis. 4. 30-40% atheromatous narrowing at the right ICA bulb.   CT angio 03/10/2019 S/P 4 vessel cerebral arteriogram followed by completev  Revascularization  Of RT PCOM and RT PCA with x 1 pass with 77mm x 71mm embotrap retriver device achieving a TICI 2b revascularization. 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 Motion degraded study. 2. Ischemic infarct of the right posterior cerebral artery territory, roughly corresponding to the ischemic volume identified on the earlier CT perfusion scan. 3. No hemorrhage or mass effect. 4. Chronic ischemic microangiopathy and age advanced atrophy.  PHYSICAL EXAM   [name] not in distress. . Afebrile. Head is nontraumatic.  Neck is supple without bruit.    Cardiac exam no murmur or gallop. Lungs are clear to auscultation. Distal pulses are well felt. Neurological Exam :  Patient is awake alert is oriented to time place and person.  He has mild dysarthria but no aphasia.  He has diminished attention registration and recall.  He has right gaze preference.  He is unable to look to the left past midline.  He has dense left homonymous hemianopsia.  Pupils are equal reactive.  Fundi not visualized.  He has mild left lower facial weakness.  Tongue midline.  Motor system exam shows mild left hemiparesis 4/5 strength with weakness of left grip and intrinsic hand muscles.  He orbits right over left upper extremity.  Tone is  slightly diminished on the left.  Deep tendon flexes are symmetric plantars are downgoing.  Sensation is diminished on the left compared to the right.  Gait not tested.  ASSESSMENT/PLAN Mr. LAMARKUS NEBEL is a 68 y.o. male with history of CVA, HTN, DB presenting with L sided weakness and speech difficulty.   Stroke:   R PCA infarct s/p IR w/ TICI2b revascularization, secondary to large vessel disease source  Code Stroke CT head No acute abnormality. Extensive small vessel disease. Old R frontal infarct. ASPECTS 10.     CTA head & neck fetal R PCA. ? R P1 occlusion. Small BA w/ high grade atheromatous narrowing. R VA ends in PICA. L ICA stenosis. R ICA 30-40%.  CT perfusion no infarct, 19 cc penumbra  Cerebral angio TICI 2b revascularization R PCOM and P PCA w/ embo trap. Underlying large vessel atherosclerosis   Post IR CT no gross ICH. Faint contrast stain L post putamen and caudate head.  MRI  R PCA infarct. Small vessel disease. Atrophy.   2D Echo ejection fraction 60 to 65%.  No wall motion abnormalities.  HIV neg LDL 101   HgbA1c 9.8  SCDs for VTE prophylaxis  aspirin 81 mg daily and clopidogrel 75 mg daily prior to admission, now on aspirin 325 mg daily and clopidogrel 75 mg daily. Continue DAPT at d/c  Therapy recommendations:  CIR - admissions coordinator following up with family  Disposition:  pending   Medically ready for rehab when bed available  Hypertension  Home meds:  None listed  Stable BP goal per typical AIS . Long-term BP goal normotensive  Hyperlipidemia  Home meds:  No statin listed  LDL pending, goal < 70  Add statin if LDL =/> 70  Diabetes type II   Home meds:  glucophage 1000 bid  HgbA1c 9.8, goal < 7.0  CBGs  SSI  Dysphagia . Secondary to stroke . Cleared for D1 nectar thick liquids . Speech ordered on board  Other Stroke Risk Factors  Advanced age  Overweight, Body mass index is 29.3 kg/m., recommend weight loss, diet  and exercise as appropriate   Hx stroke/TIA  11/2018 - few tiny white matter infarcts R frontal and parietal loves. No LVO. Extensive SVD.  01/2016 - old infarcts B BG, thalami, L cerebellum. Mild atrophy, SVD  Other Active Problems    Hospital day # 3  Patient is medically stable to transfer to inpatient rehab when bed available.  Antony Contras, MD Medical Director [place] Pager: [phone] 03/13/2019 12:40 PM   To contact Stroke Continuity provider, please refer to [email or web address] After hours, contact General Neurology

## 2019-03-13 NOTE — Plan of Care (Signed)
  Problem: Nutrition: Goal: Risk of aspiration will decrease Outcome: Progressing   Problem: Self-Care: Goal: Ability to communicate needs accurately will improve Outcome: Not Progressing

## 2019-03-13 NOTE — Progress Notes (Signed)
Inpatient Diabetes Program Recommendations  AACE/ADA: New Consensus Statement on Inpatient Glycemic Control (2015)  Target Ranges:  Prepandial:   less than 140 mg/dL      Peak postprandial:   less than 180 mg/dL (1-2 hours)      Critically ill patients:  140 - 180 mg/dL   Lab Results  Component Value Date   GLUCAP 199 (H) 03/13/2019   HGBA1C 9.8 (H) 03/11/2019    Review of Glycemic Control Results for ANIRUDH, BAIZ (MRN [id number]) as of 03/13/2019 14:36  Ref. Range 03/12/2019 12:17 03/12/2019 16:28 03/12/2019 21:13 03/13/2019 06:22 03/13/2019 12:46  Glucose-Capillary Latest Ref Range: 70 - 99 mg/dL 245 (H) 105 (H) 116 (H) 155 (H) 199 (H)   Diabetes history: DM 2 Outpatient Diabetes medications:  Metformin 1000 mg bid Current orders for Inpatient glycemic control:  Novolog moderate tid with meals   Inpatient Diabetes Program Recommendations:    Note A1C > goal.  Briefly attempted to discuss with patient.  We discussed current A1C and A1C goal.  Also discussed elimination of sugar from beverages.  Patient not engaged in conversation.  Left information regarding A1C and diabetes standards of care.  Also patient has been given LWWD booklet.  Will need continued Diabetes survival skill education while in CIR.    Thanks  Adah Perl, RN, BC-ADM Inpatient Diabetes Coordinator Pager [phone] (8a-5p)

## 2019-03-13 NOTE — Progress Notes (Signed)
Physical Therapy Treatment Patient Details Name: Joe Perry MRN: [id number] DOB: 04-24-1951 Today's Date: 03/13/2019    History of Present Illness Pt is a 68 yo male s/p  R PCOM and R PCA occlusion s/p emergent thrombectomy on 7/6. Pt admitteed with L sided weakness, R gaze perference, L homonymous hemianopsia. 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    PT Comments    Pt needed more assist and had more significant expressive difficulties today as well as more difficulties sequencing his movement.  He continues to have significant L inattention vs neglect and is unable to navigate the hallway without mod assist and RW today.  He will need significant post acute rehab before he is safe to d/c home.  CIR's note indicates unclear d/c help and may need SNF.    Follow Up Recommendations  SNF     Equipment Recommendations  None recommended by PT    Recommendations for Other Services Rehab consult     Precautions / Restrictions Precautions Precautions: Fall Precaution Comments: left sided inattention, significant awareness issues    Mobility  Bed Mobility Overal bed mobility: Needs Assistance Bed Mobility: Supine to Sit     Supine to sit: Min assist     General bed mobility comments: Min assist to support trunk to come to sitting EOB.   Transfers Overall transfer level: Needs assistance Equipment used: Rolling walker (2 wheeled) Transfers: Sit to/from Stand Sit to Stand: Min assist         General transfer comment: heavy min assist for balance to come to standing.  We stood once without RW and pt had R lateral lean, so we stood again with RW assist needed for left hand placement and pt looked more stable with AD.  I did not feel safe walking him today without an assitive device (we did a short distance yesterday).   Ambulation/Gait Ambulation/Gait assistance: Mod assist Gait Distance (Feet): 130 Feet Assistive device: Rolling walker (2 wheeled) Gait  Pattern/deviations: Step-through pattern;Staggering left;Drifts right/left     General Gait Details: Pt running into obstacles on the left, even when he hits a large obstacle he continues to push through as if to just push it out of the way.  Auditory cues to look at the obstacle and manual assist to steer him back to the right.        Modified Rankin (Stroke Patients Only) Modified Rankin (Stroke Patients Only) Pre-Morbid Rankin Score: No symptoms Modified Rankin: Moderately severe disability     Balance Overall balance assessment: Needs assistance Sitting-balance support: Feet supported;Bilateral upper extremity supported Sitting balance-[name]: Poor Sitting balance - Comments: falling to the right more today, min assist to maintain sitting balance EOB.  Postural control: Right lateral lean Standing balance support: Bilateral upper extremity supported Standing balance-[name]: Poor Standing balance comment: needs external assist in standing from therapist and AD.                            Cognition Arousal/Alertness: Awake/alert Behavior During Therapy: Flat affect Overall Cognitive Status: Impaired/Different from baseline Area of Impairment: Orientation;Attention;Memory;Following commands;Safety/judgement;Awareness;Problem solving                 Orientation Level: Disoriented to;Place;Time Current Attention Level: Sustained Memory: Decreased recall of precautions;Decreased short-term memory Following Commands: Follows one step commands inconsistently Safety/Judgement: Decreased awareness of safety;Decreased awareness of deficits Awareness: Intellectual Problem Solving: Slow processing;Decreased initiation;Difficulty sequencing;Requires verbal cues;Requires tactile cues General  Comments: Pt with significant sequencing issues today with basic commands.  No awareness of deficits with most significant being the left inattention.  I repeatedly had  planned failure of him running into and over significant obstacles in the hallway and there was no carry over of attempted tracking.  He had difficulty sequencing coming to sitting EOB.               Pertinent Vitals/Pain Pain Assessment: No/denies pain        PT Goals (current goals can now be found in the care plan section) Acute Rehab PT Goals Patient Stated Goal: unable to state today, more expressive difficulties Progress towards PT goals: Progressing toward goals    Frequency    Min 3X/week      PT Plan Current plan remains appropriate       AM-PAC PT "6 Clicks" Mobility   Outcome Measure  Help needed turning from your back to your side while in a flat bed without using bedrails?: A Little Help needed moving from lying on your back to sitting on the side of a flat bed without using bedrails?: A Little Help needed moving to and from a bed to a chair (including a wheelchair)?: A Little Help needed standing up from a chair using your arms (e.g., wheelchair or bedside chair)?: A Little Help needed to walk in hospital room?: A Lot Help needed climbing 3-5 steps with a railing? : Total 6 Click Score: 15    End of Session Equipment Utilized During Treatment: Gait belt Activity Tolerance: Patient tolerated treatment well Patient left: in chair;with call bell/phone within reach Nurse Communication: Mobility status PT Visit Diagnosis: Muscle weakness (generalized) (M62.81);Difficulty in walking, not elsewhere classified (R26.2);Hemiplegia and hemiparesis Hemiplegia - Right/Left: Left Hemiplegia - dominant/non-dominant: Dominant Hemiplegia - caused by: Cerebral infarction     Time: 6578-4696 PT Time Calculation (min) (ACUTE ONLY): 21 min  Charges:  $Gait Training: 8-22 mins          Zaiya Annunziato B. Doyce Stonehouse, PT, DPT  Acute Rehabilitation [phone] pager [phone] office  @ [facility]: [phone]            03/13/2019, 5:50 PM

## 2019-03-13 NOTE — Progress Notes (Signed)
Modified Barium Swallow Progress Note  Patient Details  Name: Joe Perry MRN: [id number] Date of Birth: [birth year]  Today's Date: 03/13/2019  Modified Barium Swallow completed.  Full report located under Chart Review in the Imaging Section.  Brief recommendations include the following:  Clinical Impression  Paitient presents with a moderate oral and pharyngeal dysphagia which is impacted by both sensory and motor impairments. He exhibited delayed anterior to posterior propulsion of all liquid and solid boluses, as well as holding of bolus prior to initiating swallow, delays of swallow initiatoin to vallecular sinus with nectar liquids, puree solids and mechanical soft solids, and to level of vallecular heading to pyriiform sinus with thin liquids. He exhibited one instance of silent aspiration or trace to minimal amount of thin liquids during first thin liquid sip, but did not exhibit any other events of penetration or aspiration even with large volume cup and straw sips. Patient exhibited oral holding with both cup and straw sips, which actually did aid in reducing amount of bolus during swallow initiation delays. Trace vallecular residuals and trace to no pyriform sinus residuals with puree and regular solids and trace vallecular residuals with thin liquids and nectar thick liquids cleared with subsequent swallows.    Swallow Evaluation Recommendations       SLP Diet Recommendations: Dysphagia 2 (Fine chop) solids;Thin liquid   Liquid Administration via: Cup;Straw   Medication Administration: Whole meds with puree   Supervision: Staff to assist with self feeding;Full supervision/cueing for compensatory strategies   Compensations: Minimize environmental distractions;Slow rate;Small sips/bites   Postural Changes: Seated upright at 90 degrees   Oral Care Recommendations: Oral care BID        [name] [name] 03/13/2019,6:23 PM   [name], MA, CCC-SLP Speech  Therapy [facility] Acute Rehab Pager: [phone]

## 2019-03-13 NOTE — Plan of Care (Signed)
  Problem: Education: Goal: Knowledge of disease or condition will improve Outcome: Progressing Goal: Knowledge of secondary prevention will improve Outcome: Progressing   

## 2019-03-13 NOTE — Plan of Care (Signed)
Progressing towards goals

## 2019-03-13 NOTE — Progress Notes (Addendum)
Inpatient Rehabilitation-Admissions Coordinator   [facility] has attempted to gather information from pt, his wife, and family friend [name]) to help determine most appropriate post acute rehab venue. At this time, [facility] is unable to confirm any social support at [place]. Pt's wife is unable to assist with answering any disposition questions and financial counselor is still attempting to address his insurance needs. AC would recommend SNF at this time as pt would benefit from a longer rehab stay and have more time to address social support needs at DC.    AC will sign off.   Please call if questions.   [name], OTR/L  Rehab Admissions Coordinator  [phone] 03/13/2019 3:53 PM

## 2019-03-14 LAB — GLUCOSE, CAPILLARY
Glucose-Capillary: 170 mg/dL — ABNORMAL HIGH (ref 70–99)
Glucose-Capillary: 147 mg/dL — ABNORMAL HIGH (ref 70–99)
Glucose-Capillary: 183 mg/dL — ABNORMAL HIGH (ref 70–99)
Glucose-Capillary: 250 mg/dL — ABNORMAL HIGH (ref 70–99)

## 2019-03-14 MED ORDER — GABAPENTIN 300 MG PO CAPS
300.0000 mg | ORAL_CAPSULE | Freq: Two times a day (BID) | ORAL | Status: DC
  Administered 2019-03-14 – 2019-03-19 (×12): 300 mg via ORAL
  Filled 2019-03-14 (×12): qty 1

## 2019-03-14 NOTE — TOC Initial Note (Signed)
Transition of Care [facility]) - Initial/Assessment Note    Patient Details  Name: Joe Perry MRN: [id number] Date of Birth: 1951-09-02  Transition of Care [facility]) CM/SW Contact:    Joe Perry, [place] Phone Number: 03/14/2019, 2:07 PM  Clinical Narrative:                  CSW called and spoke with the patient's wife, Joe Perry. CSW introduced herself and explained her role. CSW spoke with Mrs. Vanpatten about the therapy recommendation. The patient hasn't been to rehab in the past but his wife is agreeable to sending her husband to rehab. CSW asked if her husband has insurance, she stated that she did not know. She referred the CSW to speak with Joe Perry, family friend. Mrs. Retzloff stated that he knows everything about her husband. She gave permission to the CSW to speak with him. CSW stated that she would contact the patient's wife with bed offers.   CSW called and spoke with [facility] Supervisor, Joe Perry about the patient being an LOG. She stated to go ahead and complete the form and send it to the [facility].   CSW emailed the patient's financial counselor, Systems developer. She stated that she is trying to get in contact with the patient's wife and friend to complete his medicaid and disability application. She stated she would keep the CSW up to date on the status of his applications.   CSW will fax the patient out.   Expected Discharge Plan: Skilled Nursing Facility Barriers to Discharge: Continued Medical Work up, SNF Pending Medicaid   Patient Goals and CMS Choice Patient states their goals for this hospitalization and ongoing recovery are:: Pt wife is agreeable to rehab CMS Medicare.gov Compare Post Acute Care list provided to:: Patient Represenative (must comment) Choice offered to / list presented to : Spouse  Expected Discharge Plan and Services Expected Discharge Plan: [place] In-house Referral: Clinical Social Work Discharge Planning Services: NA Post Acute  Care Choice: [place] Living arrangements for the past 2 months: Single Family Home                 DME Arranged: N/A DME Agency: NA     Representative spoke with at DME Agency: na HH Arranged: NA [place] Agency: NA        Prior Living Arrangements/Services Living arrangements for the past 2 months: [place] with:: Spouse Patient language and need for interpreter reviewed:: No Do you feel safe going back to the place where you live?: Yes      Need for Family Participation in Patient Care: Yes (Comment) Care giver support system in place?: Yes (comment)   Criminal Activity/Legal Involvement Pertinent to Current Situation/Hospitalization: No - Comment as needed  Activities of Daily Living      Permission Sought/Granted Permission sought to share information with : Case Manager Permission granted to share information with : Yes, Verbal Permission Granted  Share Information with NAME: Joe Perry granted to share info w AGENCY: All SNF  Permission granted to share info w Relationship: Wife and Friend  Permission granted to share info w Contact Information: Joe Perry [phone]  Emotional Assessment Appearance:: Appears stated age Attitude/Demeanor/Rapport: Unable to Assess Affect (typically observed): Unable to Assess Orientation: : Oriented to Self, Oriented to Place Alcohol / Substance Use: Not Applicable Psych Involvement: No (comment)  Admission diagnosis:  Stroke [facility]) [I63.9] Cerebrovascular accident (CVA), unspecified mechanism ([place]) [I63.9] Cerebrovascular accident (CVA)  due to occlusion of small artery ([name]) [I63.81] 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 Hyperlipidemia 03/12/2019  . Dysphagia due to recent stroke 03/12/2019  . Overweight 03/12/2019  . 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 Abnormality of gait   . Spasticity   . Gait disturbance, post-stroke   . Cerebrovascular accident (CVA) due to occlusion of vertebral artery ([place])   . CVA (cerebral infarction) 01/31/2016  . Type 2 diabetes mellitus without complication, without long-term current use of insulin ([place]) 01/31/2016  . Essential hypertension 01/31/2016  . History of CVA (cerebrovascular accident) 01/31/2016  . 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   PCP:  Patient, No Pcp Per Pharmacy:   [facility] [place] [address] [place] [address] Phone: [phone] Fax: [phone]     Social Determinants of Health (SDOH) Interventions    Readmission Risk Interventions No flowsheet data found.

## 2019-03-14 NOTE — Progress Notes (Signed)
Occupational Therapy Treatment Patient Details Name: Joe Perry MRN: [id number] DOB: 11-12-1950 Today's Date: 03/14/2019    History of present illness Pt is a 68 yo male s/p  R PCOM and R PCA occlusion s/p emergent thrombectomy on 7/6. Pt admitteed with L sided weakness, R gaze perference, L homonymous hemianopsia. 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   OT comments  Pt performing ADL functional mobility with heavy minA and poor maneuvering of RW with tactile cues required for LUE staying on RW. LUE grip is poor and unable to hold tightly onto RW. Pt with severe L sided inattention possibly neglect. Pt with no awareness of new deficits responding "yes" to everything. Pt follows commands with increased time. Pt picking up RW at one point and required assist to return to chair for transfer. Pt starting to clean teeth with nothing on toothbrush. Pt maximal verbal and tactile cueing provided to sequence through each task. Pt requires continued OT skilled services for ADL, mobility and safety in SNF setting. OT following acutely.     Follow Up Recommendations  SNF;Supervision/Assistance - 24 hour    Equipment Recommendations  None recommended by OT    Recommendations for Other Services      Precautions / Restrictions Precautions Precautions: Fall Precaution Comments: left sided inattention, significant awareness issues Restrictions Weight Bearing Restrictions: No       Mobility Bed Mobility Overal bed mobility: Needs Assistance Bed Mobility: Supine to Sit     Supine to sit: Min assist     General bed mobility comments: Min assist to support trunk to come to sitting EOB.   Transfers Overall transfer level: Needs assistance Equipment used: Rolling walker (2 wheeled) Transfers: Sit to/from Stand Sit to Stand: Min assist         General transfer comment: Hand placement cues required    Balance Overall balance assessment: Needs assistance Sitting-balance support:  Feet supported;Bilateral upper extremity supported Sitting balance-[name]: Poor Sitting balance - Comments: posterior lean today with functional activity of LB Dressing   Standing balance support: Bilateral upper extremity supported Standing balance-[name]: Poor Standing balance comment: needs external assist in standing from therapist and AD.                           ADL either performed or assessed with clinical judgement   ADL Overall ADL's : Needs assistance/impaired     Grooming: Minimal assistance;Standing;Cueing for safety;Cueing for sequencing Grooming Details (indicate cue type and reason): Pt started brushing teeth with no toothopaste present. Pt required cues adn assist to place toothpaste on brush.             Lower Body Dressing: Min guard;Sitting/lateral leans               Functional mobility during ADLs: Minimal assistance;Cueing for safety;Cueing for sequencing;Rolling walker General ADL Comments: minguard to minA for grooming at sink in standing; LB ADL with minguardA in sitting as pt with posterior lean.     Vision   Vision Assessment?: Vision impaired- to be further tested in functional context Additional Comments: continues to require cues to turn head to L.   Perception     Praxis      Cognition Arousal/Alertness: Awake/alert Behavior During Therapy: Flat affect Overall Cognitive Status: Impaired/Different from baseline Area of Impairment: Orientation;Attention;Memory;Following commands;Safety/judgement;Awareness;Problem solving                 Orientation Level: Disoriented  to;Place;Time Current Attention Level: Sustained Memory: Decreased recall of precautions;Decreased short-term memory Following Commands: Follows one step commands with increased time Safety/Judgement: Decreased awareness of safety;Decreased awareness of deficits Awareness: Intellectual Problem Solving: Slow processing;Decreased  initiation;Difficulty sequencing;Requires verbal cues;Requires tactile cues General Comments: No awareness of L inattention. Pt requiring increased cues for attending to task and to initiate movements.        Exercises     Shoulder Instructions       General Comments      Pertinent Vitals/ Pain       Pain Assessment: No/denies pain  Home Living                                          Prior Functioning/Environment              Frequency  Min 2X/week        Progress Toward Goals  OT Goals(current goals can now be found in the care plan section)  Progress towards OT goals: Progressing toward goals  Acute Rehab OT Goals Patient Stated Goal: unable to state today, more expressive difficulties OT Goal Formulation: With patient Time For Goal Achievement: 03/26/19 Potential to Achieve Goals: Good ADL Goals Pt Will Perform Grooming: with modified independence;standing Pt Will Perform Lower Body Dressing: with min guard assist;sit to/from stand Pt Will Transfer to Toilet: with supervision;ambulating;grab bars;regular height toilet Pt Will Perform Toileting - Clothing Manipulation and hygiene: with supervision;sit to/from stand Additional ADL Goal #1: Pt will perform OOB ADL with modified independence exhibiting fair balance.  Plan Discharge plan remains appropriate    Co-evaluation                 AM-PAC OT "6 Clicks" Daily Activity     Outcome Measure   Help from another person eating meals?: A Little Help from another person taking care of personal grooming?: A Little Help from another person toileting, which includes using toliet, bedpan, or urinal?: A Lot Help from another person bathing (including washing, rinsing, drying)?: A Lot Help from another person to put on and taking off regular upper body clothing?: A Little Help from another person to put on and taking off regular lower body clothing?: A Lot 6 Click Score: 15    End of  Session Equipment Utilized During Treatment: Gait belt;Rolling walker  OT Visit Diagnosis: Unsteadiness on feet (R26.81);Muscle weakness (generalized) (M62.81)   Activity Tolerance Patient tolerated treatment well   Patient Left in chair;with call bell/phone within reach;with chair alarm set   Nurse Communication Mobility status        Time: 1034-1100 OT Time Calculation (min): 26 min  Charges: OT General Charges $OT Visit: 1 Visit OT Treatments $Self Care/Home Management : 8-22 mins $Neuromuscular Re-education: 8-22 mins  Darryl Nestle) Marsa Aris OTR/L Acute Rehabilitation Services Pager: [phone] Office: [phone]    Jenene Slicker Chozen Latulippe 03/14/2019, 12:20 PM

## 2019-03-14 NOTE — Progress Notes (Signed)
STROKE TEAM PROGRESS NOTE   Subjective (interval history) Patient is neurologically unchanged.  Continues to have right gaze preference, left hemianopsia and mild left-sided weakness.  Patient is no longer being considered for inpatient rehab due to lack of family willingness to care for him at home after discharge.  Social work has initiated paperwork to look into skilled nursing facilities but this is likely going to take some time.  Vitals:   03/13/19 2300 03/14/19 0411 03/14/19 0723 03/14/19 1100  BP: (!) 143/91 (!) 159/86 (!) 147/90 128/76  Pulse:  79 78 82  Resp: 18 18 18 16   Temp: 98.3 F (36.8 C) 98.6 F (37 C) 97.8 F (36.6 C) 98.1 F (36.7 C)  TempSrc: Oral Oral Oral Axillary  SpO2: 100% 100% 97% 100%  Weight:      Height:        CBC:  Recent Labs  Lab 03/10/19 1441 03/10/19 1448 03/11/19 0946  WBC 6.1  --  6.1  NEUTROABS 3.4  --  3.9  HGB 13.9 14.6 12.9*  HCT 42.1 43.0 38.2*  MCV 92.3  --  91.8  PLT 206  --  811    Basic Metabolic Panel:  Recent Labs  Lab 03/10/19 1441 03/10/19 1448 03/11/19 0946  NA 136 136 139  K 4.1 4.1 3.5  CL 102 103 105  CO2 24  --  26  GLUCOSE 242* 245* 169*  BUN 8 11 7*  CREATININE 0.88 0.70 0.82  CALCIUM 9.2  --  8.6*   Lipid Panel:     Component Value Date/Time   CHOL 159 03/11/2019 0946   TRIG 67 03/11/2019 0946   HDL 45 03/11/2019 0946   CHOLHDL 3.5 03/11/2019 0946   VLDL 13 03/11/2019 0946   LDLCALC 101 (H) 03/11/2019 0946   HgbA1c:  Lab Results  Component Value Date   HGBA1C 9.8 (H) 03/11/2019   Urine Drug Screen:     Component Value Date/Time   LABOPIA NONE DETECTED 11/13/2018 1217   COCAINSCRNUR NONE DETECTED 11/13/2018 1217   LABBENZ NONE DETECTED 11/13/2018 1217   AMPHETMU NONE DETECTED 11/13/2018 1217   THCU NONE DETECTED 11/13/2018 1217   LABBARB NONE DETECTED 11/13/2018 1217    Alcohol Level No results found for: [place] Code Stroke Wo Contrast 03/10/2019 1454 1. No acute  finding by CT. Extensive chronic small-vessel ischemic changes throughout the brain as outlined above. Old right frontal infarction. 2. ASPECTS is 10.   Ct Angio Head W Or Wo Contrast Ct Angio Neck W Or Wo Contrast Ct Cerebral Perfusion W Contrast 03/10/2019 1517 1. Fetal type right PCA with proximal occlusion leading to 19 cc of penumbra and no infarct by CT perfusion. There may be a right P1 segment that is also occluded. 2. 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 3. Subjectively advanced left cavernous ICA stenosis. 4. 30-40% atheromatous narrowing at the right ICA bulb.   CT angio 03/10/2019 S/P 4 vessel cerebral arteriogram followed by completev  Revascularization  Of RT PCOM and RT PCA with x 1 pass with 33mm x 84mm embotrap retriver device achieving a TICI 2b revascularization. 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 Motion degraded study. 2. Ischemic infarct of the right posterior cerebral artery territory, roughly corresponding to the ischemic volume identified on the earlier CT  perfusion scan. 3. No hemorrhage or mass effect. 4. Chronic ischemic microangiopathy and age advanced atrophy.  PHYSICAL EXAM   [name] not in distress. . Afebrile. Head is nontraumatic. Neck is supple without bruit.    Cardiac exam no murmur or gallop. Lungs are clear to auscultation. Distal pulses are well felt. Neurological Exam :  Patient is awake alert is oriented to time place and person.  He has mild dysarthria but no aphasia.  He has diminished attention registration and recall.  He has right gaze preference.  He is unable to look to the left past midline.  He has dense left homonymous hemianopsia.  Pupils are equal reactive.  Fundi not visualized.  He has mild left lower facial weakness.  Tongue  midline.  Motor system exam shows mild left hemiparesis 4/5 strength with weakness of left grip and intrinsic hand muscles.  He orbits right over left upper extremity.  Tone is slightly diminished on the left.  Deep tendon flexes are symmetric plantars are downgoing.  Sensation is diminished on the left compared to the right.  Gait not tested.  ASSESSMENT/PLAN Mr. AYAAN SHUTES is a 68 y.o. male with history of CVA, HTN, DB presenting with L sided weakness and speech difficulty.   Stroke:   R PCA infarct s/p IR w/ TICI2b revascularization, secondary to large vessel disease source  Code Stroke CT head No acute abnormality. Extensive small vessel disease. Old R frontal infarct. ASPECTS 10.     CTA head & neck fetal R PCA. ? R P1 occlusion. Small BA w/ high grade atheromatous narrowing. R VA ends in PICA. L ICA stenosis. R ICA 30-40%.  CT perfusion no infarct, 19 cc penumbra  Cerebral angio TICI 2b revascularization R PCOM and P PCA w/ embo trap. Underlying large vessel atherosclerosis   Post IR CT no gross ICH. Faint contrast stain L post putamen and caudate head.  MRI  R PCA infarct. Small vessel disease. Atrophy.   2D Echo ejection fraction 60 to 65%.  No wall motion abnormalities.  HIV neg LDL 101   HgbA1c 9.8  SCDs for VTE prophylaxis  aspirin 81 mg daily and clopidogrel 75 mg daily prior to admission, now on aspirin 325 mg daily and clopidogrel 75 mg daily. Continue DAPT at d/c  Therapy recommendations:  CIR - admissions coordinator following up with family  Disposition:  pending   Medically ready for rehab when bed available  Hypertension  Home meds:  None listed  Stable BP goal per typical AIS . Long-term BP goal normotensive  Hyperlipidemia  Home meds:  No statin listed  LDL pending, goal < 70  Add statin if LDL =/> 70  Diabetes type II   Home meds:  glucophage 1000 bid  HgbA1c 9.8, goal < 7.0  CBGs  SSI  Dysphagia . Secondary to  stroke . Cleared for D1 nectar thick liquids . Speech ordered on board  Other Stroke Risk Factors  Advanced age  Overweight, Body mass index is 29.3 kg/m., recommend weight loss, diet and exercise as appropriate   Hx stroke/TIA  11/2018 - few tiny white matter infarcts R frontal and parietal loves. No LVO. Extensive SVD.  01/2016 - old infarcts B BG, thalami, L cerebellum. Mild atrophy, SVD  Other Active Problems    Hospital day # 4  Patient is medically stable to transfer to SNF when bed available.  No changes.  Antony Contras, MD Medical Director [facility] Pager: [phone]  03/14/2019 3:12 PM   To contact Stroke Continuity provider, please refer to [email or web address] After hours, contact General Neurology

## 2019-03-14 NOTE — NC FL2 (Addendum)
[place] LEVEL OF CARE SCREENING TOOL     IDENTIFICATION  Patient Name: Joe Perry Birthdate: 1950-11-03 Sex: male Admission Date (Current Location): 03/10/2019  [facility] and [place] Number:  [place] and Address:  [facility] [place] [place] [address], [employer], [place] [address]      Provider Number: [id number]  Attending Physician Name and Address:  Garvin Fila, MD  Relative Name and Phone Number:  Tremain Rucinski, Wife, [phone]    Current Level of Care: Hospital Recommended Level of Care: [place] Prior Approval Number:    Date Approved/Denied:   PASRR Number: [id number]  Discharge Plan: SNF    Current Diagnoses: Patient Active Problem List   Diagnosis Date Noted  . Hyperlipidemia 03/12/2019  . Dysphagia due to recent stroke 03/12/2019  . Overweight 03/12/2019  . 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 Abnormality of gait   . Spasticity   . Gait disturbance, post-stroke   . Cerebrovascular accident (CVA) due to occlusion of vertebral artery ([place])   . CVA (cerebral infarction) 01/31/2016  . Type 2 diabetes mellitus without complication, without long-term current use of insulin ([place]) 01/31/2016  . Essential hypertension 01/31/2016  . History of CVA (cerebrovascular accident) 01/31/2016  . 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    Orientation RESPIRATION BLADDER Height & Weight     Self, Place  Normal Incontinent, External catheter Weight: 198 lb 6.6 oz (90 kg) Height:  5\' 9"  (175.3 cm)  BEHAVIORAL SYMPTOMS/MOOD NEUROLOGICAL BOWEL NUTRITION STATUS      Incontinent Diet(Dysphasia 2 diet, thin liquids)  AMBULATORY STATUS COMMUNICATION OF NEEDS Skin   Limited Assist Verbally Normal                       Personal Care Assistance Level of Assistance  Bathing, Feeding, Total care,  Dressing Bathing Assistance: Limited assistance Feeding assistance: Limited assistance Dressing Assistance: Limited assistance Total Care Assistance: Limited assistance   Functional Limitations Info  Sight, Hearing, Speech Sight Info: Adequate Hearing Info: Adequate Speech Info: Adequate    SPECIAL CARE FACTORS FREQUENCY  PT (By licensed PT), OT (By licensed OT), Speech therapy     PT Frequency: 5x/wk OT Frequency: 5x/wk     Speech Therapy Frequency: 3x/wk      Contractures Contractures Info: Not present    Additional Factors Info  Code Status, Insulin Sliding Scale, Allergies Code Status Info: Full Code Allergies Info: No Known Allergies   Insulin Sliding Scale Info: insulin aspart novolog 0-15 units 3x daily w/meals       Current Medications (03/14/2019):  This is the current hospital active medication list Current Facility-Administered Medications  Medication Dose Route Frequency Provider Last Rate Last Dose  .  stroke: mapping our early stages of recovery book   Does not apply Once Greta Doom, MD      . 0.9 %  sodium chloride infusion   Intravenous Continuous Greta Doom, MD 75 mL/hr at 03/13/19 2229    . 0.9 %  sodium chloride infusion   Intravenous Continuous Deveshwar, Sanjeev, MD      . acetaminophen (TYLENOL) tablet 650 mg  650 mg Oral Q4H PRN Deveshwar, Sanjeev, MD       Or  . acetaminophen (TYLENOL) solution 650 mg  650 mg Per Tube Q4H PRN Luanne Bras, MD  Or  . acetaminophen (TYLENOL) suppository 650 mg  650 mg Rectal Q4H PRN Deveshwar, Willaim Rayas, MD      . aspirin tablet 325 mg  325 mg Oral Daily Deveshwar, Sanjeev, MD   325 mg at 03/14/19 1128   Or  . aspirin chewable tablet 324 mg  324 mg Per Tube Daily Deveshwar, Sanjeev, MD      . clopidogrel (PLAVIX) tablet 75 mg  75 mg Oral Daily Deveshwar, Sanjeev, MD   75 mg at 03/14/19 1127   Or  . clopidogrel (PLAVIX) tablet 75 mg  75 mg Per Tube Daily Deveshwar, Sanjeev, MD       . gabapentin (NEURONTIN) capsule 300 mg  300 mg Oral BID Leonie Man, Fawzi Melman S, MD      . insulin aspart (novoLOG) injection 0-15 Units  0-15 Units Subcutaneous TID WC Greta Doom, MD   3 Units at 03/14/19 1129  . iohexol (OMNIPAQUE) 300 MG/ML solution 100 mL  100 mL Intra-arterial Once PRN Luanne Bras, MD         Discharge Medications: Please see discharge summary for a list of discharge medications.  Relevant Imaging Results:  Relevant Lab Results:   Additional Information SSN: [id number]  Philippa Chester Pinion, LCSWA  I have personally obtained history,examined this patient, reviewed notes, independently viewed imaging studies, participated in medical decision making and plan of care.ROS completed by me personally and pertinent positives fully documented  I have made any additions or clarifications directly to the above note. Agree with note above.   Antony Contras, MD Medical Director [facility] Pager: [phone] 03/14/2019 4:55 PM

## 2019-03-14 NOTE — Progress Notes (Signed)
  Speech Language Pathology Treatment: Dysphagia;Cognitive-Linquistic  Patient Details Name: Joe Perry MRN: [id number] DOB: 28-Jun-1951 Today's Date: 03/14/2019 Time: 2707-8675 SLP Time Calculation (min) (ACUTE ONLY): 25 min  Assessment / Plan / Recommendation Clinical Impression  Patient seen to address dysphagia goals as well as aphasia goals as he continues to exhibit an expressive aphasia. He was not able to tell SLP what is particular job was, saying "carbon copies" after significant amount of cues and questiong. He is able to answer yes/no questions at basic and biographical level without significant dificulty. He performed basic level problem solving tasks and attention to left visual field and left arm for bilateral use of arms/hands, with min-mod SLP cues (verbal, visual, gestural). Patient described his speech difficulty as "static". He generally will not initiate verbal responses or comments unless answering a quesiton or if cued by SLP.  He was able to masticate and swallow dysphagia 3 solids with prolonged mastication and brief delay in initiating swallow, without oral residuals as patient self-managed using lingual sweep. As he continues to require cues for attention, will keep him on Dys 2 but plan to upgrade in next couple days if he progresses.     HPI HPI: 68 year old male admitted 03/10/2019 with left weakness, unsteady gait, right gaze, left hemianopsia. PMH: CVA, HTN, DM. CT negative. MRI pending      SLP Plan  Continue with current plan of care       Recommendations  Diet recommendations: Dysphagia 2 (fine chop) Liquids provided via: Cup;Straw Medication Administration: Crushed with puree Supervision: Patient able to self feed;Full supervision/cueing for compensatory strategies;Staff to assist with self feeding Compensations: Minimize environmental distractions;Slow rate;Small sips/bites Postural Changes and/or Swallow Maneuvers: Seated upright 90 degrees               Oral Care Recommendations: Oral care BID Follow up Recommendations: Inpatient Rehab;Skilled Nursing facility SLP Visit Diagnosis: Dysphagia, oropharyngeal phase (R13.12) Plan: Continue with current plan of care       GO                Joe Perry 03/14/2019, 5:24 PM   Joe Baller, MA, [place] [facility] Pager: [phone]

## 2019-03-14 NOTE — Plan of Care (Signed)
Progressing towards goals

## 2019-03-15 LAB — GLUCOSE, CAPILLARY
Glucose-Capillary: 169 mg/dL — ABNORMAL HIGH (ref 70–99)
Glucose-Capillary: 142 mg/dL — ABNORMAL HIGH (ref 70–99)
Glucose-Capillary: 94 mg/dL (ref 70–99)
Glucose-Capillary: 119 mg/dL — ABNORMAL HIGH (ref 70–99)

## 2019-03-15 MED ORDER — METFORMIN HCL 500 MG PO TABS
1000.0000 mg | ORAL_TABLET | Freq: Two times a day (BID) | ORAL | Status: DC
  Administered 2019-03-15 – 2019-05-14 (×116): 1000 mg via ORAL
  Filled 2019-03-15 (×119): qty 2

## 2019-03-15 MED ORDER — ATORVASTATIN CALCIUM 40 MG PO TABS
40.0000 mg | ORAL_TABLET | Freq: Every day | ORAL | Status: DC
  Administered 2019-03-15 – 2019-06-02 (×78): 40 mg via ORAL
  Filled 2019-03-15 (×36): qty 1
  Filled 2019-03-15: qty 4
  Filled 2019-03-15 (×43): qty 1

## 2019-03-15 NOTE — Plan of Care (Signed)
Progressing towards goals

## 2019-03-15 NOTE — Progress Notes (Addendum)
STROKE TEAM PROGRESS NOTE   Interval history: Nurse at bedside. Neuro unchanged. Patient isuninsured and trying to get medicaid. CIR agreed to admit if the family could help him on discharge hoever the family is non-commital and so looking for a SNF. He has a wife and children, wife own;t commit to helping him. Needs SNF, in the process of finding a SNF and getting insurance approved. No neuro changes. Follows some commands, right arm drift, expressive and receptive aphasia. Says it is May 1978. Restless legs given neurontin.  Vitals:   03/14/19 2325 03/15/19 0339 03/15/19 0751 03/15/19 1126  BP: 136/80 (!) 142/83 (!) 167/150 (!) 137/93  Pulse: 83 71 77 69  Resp: 18 18 14    Temp: 97.8 F (36.6 C) 98.2 F (36.8 C) 97.7 F (36.5 C) 98.1 F (36.7 C)  TempSrc:  Oral Oral Oral  SpO2: 100% 100%  99%  Weight:      Height:        CBC:  Recent Labs  Lab 03/10/19 1441 03/10/19 1448 03/11/19 0946  WBC 6.1  --  6.1  NEUTROABS 3.4  --  3.9  HGB 13.9 14.6 12.9*  HCT 42.1 43.0 38.2*  MCV 92.3  --  91.8  PLT 206  --  500    Basic Metabolic Panel:  Recent Labs  Lab 03/10/19 1441 03/10/19 1448 03/11/19 0946  NA 136 136 139  K 4.1 4.1 3.5  CL 102 103 105  CO2 24  --  26  GLUCOSE 242* 245* 169*  BUN 8 11 7*  CREATININE 0.88 0.70 0.82  CALCIUM 9.2  --  8.6*   Lipid Panel:     Component Value Date/Time   CHOL 159 03/11/2019 0946   TRIG 67 03/11/2019 0946   HDL 45 03/11/2019 0946   CHOLHDL 3.5 03/11/2019 0946   VLDL 13 03/11/2019 0946   LDLCALC 101 (H) 03/11/2019 0946   HgbA1c:  Lab Results  Component Value Date   HGBA1C 9.8 (H) 03/11/2019   Urine Drug Screen:     Component Value Date/Time   LABOPIA NONE DETECTED 11/13/2018 1217   COCAINSCRNUR NONE DETECTED 11/13/2018 1217   LABBENZ NONE DETECTED 11/13/2018 1217   AMPHETMU NONE DETECTED 11/13/2018 1217   THCU NONE DETECTED 11/13/2018 1217   LABBARB NONE DETECTED 11/13/2018 1217    Alcohol Level No results found for:  [place] Head Code Stroke Wo Contrast 03/10/2019 1454 1. No acute finding by CT. Extensive chronic small-vessel ischemic changes throughout the brain as outlined above. Old right frontal infarction. 2. ASPECTS is 10.   Ct Angio Head W Or Wo Contrast Ct Angio Neck W Or Wo Contrast Ct Cerebral Perfusion W Contrast 03/10/2019 1517 1. Fetal type right PCA with proximal occlusion leading to 19 cc of penumbra and no infarct by CT perfusion. There may be a right P1 segment that is also occluded. 2. 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 3. Subjectively advanced left cavernous ICA stenosis. 4. 30-40% atheromatous narrowing at the right ICA bulb.   CT angio 03/10/2019 S/P 4 vessel cerebral arteriogram followed by completev  Revascularization  Of RT PCOM and RT PCA with x 1 pass with 60mm x 68mm embotrap retriver device achieving a TICI 2b revascularization. 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 Motion degraded study. 2. Ischemic  infarct of the right posterior cerebral artery territory, roughly corresponding to the ischemic volume identified on the earlier CT perfusion scan. 3. No hemorrhage or mass effect. 4. Chronic ischemic microangiopathy and age advanced atrophy.  PHYSICAL EXAM   [name] not in distress. . Afebrile. Head is nontraumatic. Neck is supple without bruit.    Cardiac exam no murmur or gallop. Lungs are clear to auscultation. Distal pulses are well felt. Neurological Exam :  Patient is awake alert is oriented to person, says it is May 1976.  He has mild dysarthria but no aphasia.  He has diminished attention registration and recall.  He has right gaze preference.  He is unable to look to the left past midline.  He has dense left homonymous hemianopsia.  Pupils are equal  reactive.  Fundi not visualized.  He has mild left lower facial weakness.  Tongue midline.  Motor system exam shows mild left hemiparesis 4/5 strength with weakness of left grip and intrinsic hand muscles.  He orbits right over left upper extremity.  Tone is slightly diminished on the left.  Deep tendon flexes are symmetric plantars are downgoing.  Sensation is diminished on the left compared to the right.  Gait not tested.  ASSESSMENT/PLAN [name] is a 68 y.o. male with history of CVA, HTN, DB presenting with L sided weakness and speech difficulty.   Stroke:   R PCA infarct s/p IR w/ TICI2b revascularization, secondary to large vessel disease source  Code Stroke CT head No acute abnormality. Extensive small vessel disease. Old R frontal infarct. ASPECTS 10.     CTA head & neck fetal R PCA. ? R P1 occlusion. Small BA w/ high grade atheromatous narrowing. R VA ends in PICA. L ICA stenosis. R ICA 30-40%.  CT perfusion no infarct, 19 cc penumbra  Cerebral angio TICI 2b revascularization R PCOM and P PCA w/ embo trap. Underlying large vessel atherosclerosis   Post IR CT no gross ICH. Faint contrast stain L post putamen and caudate head.  MRI  R PCA infarct. Small vessel disease. Atrophy.   2D Echo ejection fraction 60 to 65%.  No wall motion abnormalities.  HIV neg LDL 101   HgbA1c 9.8  SCDs for VTE prophylaxis  aspirin 81 mg daily and clopidogrel 75 mg daily prior to admission, now on aspirin 325 mg daily and clopidogrel 75 mg daily. Continue DAPT at d/c  Therapy recommendations:  CIR - admissions coordinator following up with family  Disposition:  pending   Medically ready for rehab when bed available  Hypertension  Home meds:  None listed  Stable BP goal per typical AIS . Long-term BP goal normotensive  Hyperlipidemia  Home meds:  No statin listed  LDL 101, goal < 70  Started Lipitor  Diabetes type II   Home meds:  glucophage 1000 bid  HgbA1c 9.8,  goal < 7.0  CBGs  SSI  Dysphagia . Secondary to stroke . Cleared for D1 nectar thick liquids . Speech ordered on board  Other Stroke Risk Factors  Advanced age  Overweight, Body mass index is 29.3 kg/m., recommend weight loss, diet and exercise as appropriate   Hx stroke/TIA  11/2018 - few tiny white matter infarcts R frontal and parietal loves. No LVO. Extensive SVD.  01/2016 - old infarcts B BG, thalami, L cerebellum. Mild atrophy, SVD  Other Active Problems    Hospital day # 5  Patient is medically stable to transfer to SNF when  bed available.  No changes.   Personally examined patient and images, and have participated in and made any corrections needed to history, physical, neuro exam,assessment and plan as stated above.  I have personally obtained the history, evaluated lab date, reviewed imaging studies and agree with radiology interpretations.    Sarina Ill, MD Stroke Neurology   A total of 25 minutes was spent for the care of this patient, spent on counseling patient and family on different diagnostic and therapeutic options, counseling and coordination of care, riskd ans benefits of management, compliance, or risk factor reduction and education.   To contact Stroke Continuity provider, please refer to [email or web address] After hours, contact General Neurology

## 2019-03-16 DIAGNOSIS — I63531 Cerebral infarction due to unspecified occlusion or stenosis of right posterior cerebral artery: Principal | ICD-10-CM

## 2019-03-16 DIAGNOSIS — Z8673 Personal history of transient ischemic attack (TIA), and cerebral infarction without residual deficits: Secondary | ICD-10-CM

## 2019-03-16 LAB — GLUCOSE, CAPILLARY
Glucose-Capillary: 138 mg/dL — ABNORMAL HIGH (ref 70–99)
Glucose-Capillary: 163 mg/dL — ABNORMAL HIGH (ref 70–99)
Glucose-Capillary: 100 mg/dL — ABNORMAL HIGH (ref 70–99)
Glucose-Capillary: 168 mg/dL — ABNORMAL HIGH (ref 70–99)

## 2019-03-16 LAB — BASIC METABOLIC PANEL
Anion gap: 12 (ref 5–15)
BUN: 8 mg/dL (ref 8–23)
CO2: 23 mmol/L (ref 22–32)
Calcium: 9.2 mg/dL (ref 8.9–10.3)
Chloride: 104 mmol/L (ref 98–111)
Creatinine, Ser: 0.8 mg/dL (ref 0.61–1.24)
GFR calc Af Amer: >60 mL/min (ref >60)
GFR calc non Af Amer: >60 mL/min (ref >60)
Glucose, Bld: 138 mg/dL — ABNORMAL HIGH (ref 70–99)
Potassium: 3.5 mmol/L (ref 3.5–5.1)
Sodium: 139 mmol/L (ref 135–145)

## 2019-03-16 LAB — CBC
HCT: 43.1 % (ref 39.0–52.0)
Hemoglobin: 14.6 g/dL (ref 13.0–17.0)
MCH: 30.8 pg (ref 26.0–34.0)
MCHC: 33.9 g/dL (ref 30.0–36.0)
MCV: 90.9 fL (ref 80.0–100.0)
Platelets: 267 10*3/uL (ref 150–400)
RBC: 4.74 MIL/uL (ref 4.22–5.81)
RDW: 12.2 % (ref 11.5–15.5)
WBC: 5.5 10*3/uL (ref 4.0–10.5)
nRBC: 0 % (ref 0.0–0.2)

## 2019-03-16 MED ORDER — HYDRALAZINE HCL 20 MG/ML IJ SOLN
5.0000 mg | INTRAMUSCULAR | Status: DC | PRN
  Administered 2019-03-20 – 2019-03-26 (×6): 5 mg via INTRAVENOUS
  Filled 2019-03-16 (×7): qty 1

## 2019-03-16 MED ORDER — HYDRALAZINE HCL 20 MG/ML IJ SOLN: 5.0000 mg | INTRAMUSCULAR | Status: DC | PRN

## 2019-03-16 MED ORDER — DIPHENHYDRAMINE HCL 25 MG PO CAPS
25.0000 mg | ORAL_CAPSULE | Freq: Three times a day (TID) | ORAL | Status: DC | PRN
  Administered 2019-03-16 – 2019-03-31 (×25): 25 mg via ORAL
  Filled 2019-03-16 (×25): qty 1

## 2019-03-16 MED ORDER — BENAZEPRIL HCL 20 MG PO TABS
10.0000 mg | ORAL_TABLET | Freq: Every day | ORAL | Status: DC
  Administered 2019-03-16 – 2019-04-02 (×18): 10 mg via ORAL
  Filled 2019-03-16 (×18): qty 1

## 2019-03-16 NOTE — Progress Notes (Signed)
Pt removed IV on R AC, condom cath, had episode of incontinence,  pleasantly confused, put on mittens to prevent pulling IV off, still getting fluids, monitoring continues

## 2019-03-16 NOTE — Progress Notes (Signed)
STROKE TEAM PROGRESS NOTE   Interval history:  Pt lying in bed, still has dense left hemianopia, left ataxia and expressive aphasia.  Able to eat well, DC IV fluid.  Pending SNF  Vitals:   03/15/19 2358 03/16/19 0439 03/16/19 0742 03/16/19 0950  BP: (!) 165/93 (!) 156/81 (!) 165/105 110/70  Pulse: 69 93 75 76  Resp: 18 18 16    Temp: 97.8 F (36.6 C) (!) 97.5 F (36.4 C) (!) 97.4 F (36.3 C)   TempSrc: Oral Oral Oral   SpO2: 99% 99%    Weight:      Height:        CBC:  Recent Labs  Lab 03/10/19 1441  03/11/19 0946 03/16/19 0512  WBC 6.1  --  6.1 5.5  NEUTROABS 3.4  --  3.9  --   HGB 13.9   < > 12.9* 14.6  HCT 42.1   < > 38.2* 43.1  MCV 92.3  --  91.8 90.9  PLT 206  --  202 267   < > = values in this interval not displayed.    Basic Metabolic Panel:  Recent Labs  Lab 03/11/19 0946 03/16/19 0512  NA 139 139  K 3.5 3.5  CL 105 104  CO2 26 23  GLUCOSE 169* 138*  BUN 7* 8  CREATININE 0.82 0.80  CALCIUM 8.6* 9.2   Lipid Panel:     Component Value Date/Time   CHOL 159 03/11/2019 0946   TRIG 67 03/11/2019 0946   HDL 45 03/11/2019 0946   CHOLHDL 3.5 03/11/2019 0946   VLDL 13 03/11/2019 0946   LDLCALC 101 (H) 03/11/2019 0946   HgbA1c:  Lab Results  Component Value Date   HGBA1C 9.8 (H) 03/11/2019   Urine Drug Screen:     Component Value Date/Time   LABOPIA NONE DETECTED 11/13/2018 1217   COCAINSCRNUR NONE DETECTED 11/13/2018 1217   LABBENZ NONE DETECTED 11/13/2018 1217   AMPHETMU NONE DETECTED 11/13/2018 1217   THCU NONE DETECTED 11/13/2018 1217   LABBARB NONE DETECTED 11/13/2018 1217    Alcohol Level No results found for: [place] Head Code Stroke Wo Contrast 03/10/2019 1454 1. No acute finding by CT. Extensive chronic small-vessel ischemic changes throughout the brain as outlined above. Old right frontal infarction. 2. ASPECTS is 10.   Ct Angio Head W Or Wo Contrast Ct Angio Neck W Or Wo Contrast Ct Cerebral Perfusion W Contrast 03/10/2019  1517 1. Fetal type right PCA with proximal occlusion leading to 19 cc of penumbra and no infarct by CT perfusion. There may be a right P1 segment that is also occluded. 2. 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 3. Subjectively advanced left cavernous ICA stenosis. 4. 30-40% atheromatous narrowing at the right ICA bulb.   CT angio 03/10/2019 S/P 4 vessel cerebral arteriogram followed by completev  Revascularization  Of RT PCOM and RT PCA with x 1 pass with 5mm x 70mm embotrap retriver device achieving a TICI 2b revascularization. 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 Motion degraded study. 2. Ischemic infarct of the right posterior cerebral artery territory, roughly corresponding to the ischemic volume identified on the earlier CT perfusion scan. 3. No hemorrhage or mass effect. 4. Chronic ischemic microangiopathy and age advanced atrophy.  PHYSICAL EXAM    Temp:  [97.4 F (  36.3 C)-97.8 F (36.6 C)] 97.5 F (36.4 C) (07/12 1150) Pulse Rate:  [67-93] 85 (07/12 1150) Resp:  [16-18] 16 (07/12 1150) BP: (110-165)/(70-105) 141/85 (07/12 1150) SpO2:  [96 %-99 %] 96 % (07/12 1150)  General - Well nourished, well developed, in no apparent distress.  Ophthalmologic - fundi not visualized due to noncooperation.  Cardiovascular - Regular rate and rhythm.  Mental Status -  Level of arousal, however not able to answer much orientation questions due to aphasia. Language exam showed paucity of speech, expressive aphasia > receptive aphasia, follows most of commands, but not all of them. Perseveration and anomia. Able to repeat.   Cranial Nerves II - XII - II - left hemianopia. III, IV, VI - Extraocular movements intact, however, attending more on the right due to left hemianopia. V - Facial sensation intact  bilaterally. VII - Facial movement intact bilaterally. VIII - Hearing & vestibular intact bilaterally. X - Palate elevates symmetrically. XI - Chin turning & shoulder shrug intact bilaterally. XII - Tongue protrusion intact.  Motor Strength - [name] was normal in all extremities and pronator drift was absent.  Bulk was normal and fasciculations were absent.   Motor Tone - Muscle tone was assessed at the neck and appendages and was normal.  Reflexes - [name] were symmetrical in all extremities and he had no pathological reflexes.  Sensory - Light touch, temperature/pinprick were assessed and were symmetrical.    Coordination - The patient had significant ataxia on the left FTN.  Tremor was absent. Restless of LLE  Gait and Station - deferred.   ASSESSMENT/PLAN [name] is a 68 y.o. male with history of CVA, HTN, DB presenting with L sided weakness and speech difficulty.   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 Old R frontal infarct.    CTA head & neck - R P1 occlusion. Small BA w/ high grade atheromatous narrowing. L ICA siphon stenosis.  CT perfusion 19 cc penumbra  Cerebral angio TICI 2b revascularization occluded R PCOM and P PCA. Underlying large vessel atherosclerosis   MRI  R PCA infarct    2D Echo EF 60 to 65%.   Recommend 30-day CardioNet monitoring to rule out A. fib as outpatient  HIV neg  LDL 101   HgbA1c 9.8  SCDs for VTE prophylaxis  aspirin 81 mg daily and clopidogrel 75 mg daily prior to admission, now on aspirin 325 mg daily and clopidogrel 75 mg daily. Continue DAPT for 3 months and then Plavix alone.  Therapy recommendations:  SNF  Disposition:  pending   Hx stroke/TIA  11/2018 - presented for dizziness and unsteady, MRI showed right frontal and parietal small white matter infarcts. No LVO.  Extensive SVD.  Put on DAPT.  Patient signed [facility]  01/2016 - admitted for gait disability.  MRI negative for acute infarct.  Old infarcts B BG, thalami, L cerebellum.   Hypertension  Home meds:  None listed  Stable on the high side  Now on Lotensin 10 mg . Long-term BP goal normotensive  Hyperlipidemia  Home meds:  No statin listed  LDL 101, goal < 70  Started Lipitor 40  Continue statin on discharge  Diabetes type II   Home meds:  glucophage 1000 bid  HgbA1c 9.8, goal < 7.0  Uncontrolled  CBGs  SSI  Resume metformin  Close PCP follow-up  Dysphagia . Secondary to stroke . Cleared for D1 nectar thick liquids . Speech on board  Other Stroke Risk Factors  Advanced age  Overweight, Body mass index is 29.3 kg/m., recommend weight loss, diet and exercise as appropriate   Other Active Problems      Hospital day # 6  [name], MD PhD Stroke Neurology 03/16/2019 8:34 PM     To contact Stroke Continuity provider, please refer to [email or web address] After hours, contact General Neurology

## 2019-03-17 LAB — CBC
HCT: 40.8 % (ref 39.0–52.0)
Hemoglobin: 13.8 g/dL (ref 13.0–17.0)
MCH: 31 pg (ref 26.0–34.0)
MCHC: 33.8 g/dL (ref 30.0–36.0)
MCV: 91.7 fL (ref 80.0–100.0)
Platelets: 204 10*3/uL (ref 150–400)
RBC: 4.45 MIL/uL (ref 4.22–5.81)
RDW: 12.2 % (ref 11.5–15.5)
WBC: 7.3 10*3/uL (ref 4.0–10.5)
nRBC: 0 % (ref 0.0–0.2)

## 2019-03-17 LAB — BASIC METABOLIC PANEL
Anion gap: 11 (ref 5–15)
BUN: 11 mg/dL (ref 8–23)
CO2: 25 mmol/L (ref 22–32)
Calcium: 9.1 mg/dL (ref 8.9–10.3)
Chloride: 101 mmol/L (ref 98–111)
Creatinine, Ser: 1.06 mg/dL (ref 0.61–1.24)
GFR calc Af Amer: >60 mL/min (ref >60)
GFR calc non Af Amer: >60 mL/min (ref >60)
Glucose, Bld: 162 mg/dL — ABNORMAL HIGH (ref 70–99)
Potassium: 4 mmol/L (ref 3.5–5.1)
Sodium: 137 mmol/L (ref 135–145)

## 2019-03-17 LAB — GLUCOSE, CAPILLARY
Glucose-Capillary: 161 mg/dL — ABNORMAL HIGH (ref 70–99)
Glucose-Capillary: 159 mg/dL — ABNORMAL HIGH (ref 70–99)
Glucose-Capillary: 202 mg/dL — ABNORMAL HIGH (ref 70–99)
Glucose-Capillary: 122 mg/dL — ABNORMAL HIGH (ref 70–99)

## 2019-03-17 MED ORDER — ASPIRIN EC 325 MG PO TBEC
325.0000 mg | DELAYED_RELEASE_TABLET | Freq: Every day | ORAL | Status: DC
  Administered 2019-03-18 – 2019-03-22 (×5): 325 mg via ORAL
  Filled 2019-03-17 (×5): qty 1

## 2019-03-17 NOTE — Progress Notes (Signed)
STROKE TEAM PROGRESS NOTE   Interval history:  Pt is walking with PT in the hallway, no significant weakness on the left leg seen. Still has left hemianopia.  Pending SNF  Vitals:   03/16/19 2352 03/17/19 0257 03/17/19 0859 03/17/19 1137  BP: (!) 160/102 117/66 134/90 125/81  Pulse: 93 65 73 84  Resp: 18 18 18 18   Temp: 98.2 F (36.8 C) 98.1 F (36.7 C) 98.3 F (36.8 C) 98 F (36.7 C)  TempSrc: Oral Oral Oral Oral  SpO2: 99% 100% 99% 100%  Weight:      Height:        CBC:  Recent Labs  Lab 03/11/19 0946 03/16/19 0512 03/17/19 0756  WBC 6.1 5.5 7.3  NEUTROABS 3.9  --   --   HGB 12.9* 14.6 13.8  HCT 38.2* 43.1 40.8  MCV 91.8 90.9 91.7  PLT 202 267 720    Basic Metabolic Panel:  Recent Labs  Lab 03/16/19 0512 03/17/19 0756  NA 139 137  K 3.5 4.0  CL 104 101  CO2 23 25  GLUCOSE 138* 162*  BUN 8 11  CREATININE 0.80 1.06  CALCIUM 9.2 9.1   Lipid Panel:     Component Value Date/Time   CHOL 159 03/11/2019 0946   TRIG 67 03/11/2019 0946   HDL 45 03/11/2019 0946   CHOLHDL 3.5 03/11/2019 0946   VLDL 13 03/11/2019 0946   LDLCALC 101 (H) 03/11/2019 0946   HgbA1c:  Lab Results  Component Value Date   HGBA1C 9.8 (H) 03/11/2019   Urine Drug Screen:     Component Value Date/Time   LABOPIA NONE DETECTED 11/13/2018 1217   COCAINSCRNUR NONE DETECTED 11/13/2018 1217   LABBENZ NONE DETECTED 11/13/2018 1217   AMPHETMU NONE DETECTED 11/13/2018 1217   THCU NONE DETECTED 11/13/2018 1217   LABBARB NONE DETECTED 11/13/2018 1217    Alcohol Level No results found for: [place] Head Code Stroke Wo Contrast 03/10/2019 1454 1. No acute finding by CT. Extensive chronic small-vessel ischemic changes throughout the brain as outlined above. Old right frontal infarction. 2. ASPECTS is 10.   Ct Angio Head W Or Wo Contrast Ct Angio Neck W Or Wo Contrast Ct Cerebral Perfusion W Contrast 03/10/2019 1517 1. Fetal type right PCA with proximal occlusion leading to 19 cc of  penumbra and no infarct by CT perfusion. There may be a right P1 segment that is also occluded. 2. 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 3. Subjectively advanced left cavernous ICA stenosis. 4. 30-40% atheromatous narrowing at the right ICA bulb.   CT angio 03/10/2019 S/P 4 vessel cerebral arteriogram followed by completev  Revascularization  Of RT PCOM and RT PCA with x 1 pass with 71mm x 58mm embotrap retriver device achieving a TICI 2b revascularization. 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 Motion degraded study. 2. Ischemic infarct of the right posterior cerebral artery territory, roughly corresponding to the ischemic volume identified on the earlier CT perfusion scan. 3. No hemorrhage or mass effect. 4. Chronic ischemic microangiopathy and age advanced atrophy.  PHYSICAL EXAM    Temp:  [98 F (36.7 C)-98.3 F (36.8 C)] 98 F (36.7 C) (07/13 1137) Pulse Rate:  [65-93] 84 (07/13 1137) Resp:  [16-18] 18 (07/13 1137) BP: (117-160)/(66-102) 125/81 (07/13 1137) SpO2:  [96 %-100 %]  100 % (07/13 1137)  General - Well nourished, well developed, in no apparent distress.  Ophthalmologic - fundi not visualized due to noncooperation.  Cardiovascular - Regular rate and rhythm.  Mental Status -  Level of arousal, however not able to answer much orientation questions due to aphasia. Language exam showed paucity of speech, expressive aphasia > receptive aphasia, follows most of commands, but not all of them. Perseveration and anomia. Able to repeat.   Cranial Nerves II - XII - II - left hemianopia. III, IV, VI - Extraocular movements intact, however, attending more on the right due to left hemianopia. V - Facial sensation intact bilaterally. VII - Facial movement intact bilaterally. VIII - Hearing &  vestibular intact bilaterally. X - Palate elevates symmetrically. XI - Chin turning & shoulder shrug intact bilaterally. XII - Tongue protrusion intact.  Motor Strength - [name] was normal in all extremities and pronator drift was absent.  Bulk was normal and fasciculations were absent.   Motor Tone - Muscle tone was assessed at the neck and appendages and was normal.  Reflexes - [name] were symmetrical in all extremities and he had no pathological reflexes.  Sensory - Light touch, temperature/pinprick were assessed and were symmetrical.    Coordination - The patient had significant ataxia on the left FTN.  Tremor was absent. Restless of LLE  Gait and Station - deferred.   ASSESSMENT/PLAN [name] is a 68 y.o. male with history of CVA, HTN, DB presenting with L sided weakness and speech difficulty.   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 Old R frontal infarct.    CTA head & neck - R P1 occlusion. Small BA w/ high grade atheromatous narrowing. L ICA siphon stenosis.  CT perfusion 19 cc penumbra  Cerebral angio TICI 2b revascularization occluded R PCOM and P PCA. Underlying large vessel atherosclerosis   MRI  R PCA infarct    2D Echo EF 60 to 65%.   Recommend 30-day CardioNet monitoring to rule out A. fib as outpatient  HIV neg  LDL 101   HgbA1c 9.8  SCDs for VTE prophylaxis  aspirin 81 mg daily and clopidogrel 75 mg daily prior to admission, now on aspirin 325 mg daily and clopidogrel 75 mg daily. Continue DAPT for 3 months and then Plavix alone.  Therapy recommendations:  SNF  Disposition:  pending   Hx stroke/TIA  11/2018 - presented for dizziness and unsteady, MRI showed right frontal and parietal small white matter infarcts. No LVO. Extensive SVD.  Put on DAPT.  Patient signed [facility]  01/2016 - admitted for gait  disability.  MRI negative for acute infarct.  Old infarcts B BG, thalami, L cerebellum.   Hypertension  Home meds:  None listed  Stable on the high side  Now on Lotensin 10 mg . Long-term BP goal normotensive  Hyperlipidemia  Home meds:  No statin listed  LDL 101, goal < 70  Started Lipitor 40  Continue statin on discharge  Diabetes type II   Home meds:  glucophage 1000 bid  HgbA1c 9.8, goal < 7.0  Uncontrolled  CBGs  SSI  Resume metformin  Close PCP follow-up  Dysphagia . Secondary to stroke . Cleared for D1 nectar thick liquids . Speech on board  Other Stroke Risk Factors  Advanced age  Overweight, Body mass index is 29.3 kg/m., recommend  weight loss, diet and exercise as appropriate   Other Active Problems      Hospital day # 7  [name], MD PhD Stroke Neurology 03/17/2019 2:48 PM     To contact Stroke Continuity provider, please refer to [email or web address] After hours, contact General Neurology

## 2019-03-17 NOTE — Progress Notes (Signed)
Occupational Therapy Treatment Patient Details Name: Joe Perry MRN: [id number] DOB: 16-Nov-1950 Today's Date: 03/17/2019    History of present illness Pt is a 68 yo male s/p  R PCOM and R PCA occlusion s/p emergent thrombectomy on 7/6. Pt admitteed with L sided weakness, R gaze perference, L homonymous hemianopsia. 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   OT comment  Pt very restless upon entering the room. Pt ambulated with RW and min A to recliner chair. Pt needing min - mod cuing for technique and sequencing. Pt needing assistance to maintain L UE on RW and for advancement. Pt standing x 3 from recliner chair with min A and taking 4 steps forwards and then backwards with increased difficulty backward but able to maintain balance with min A. Pt unable to follow directions for finger isolation exercises and coordination exercises without first hand over hand for movements. Pt seated in wheelchair with chair belt alarm donned and activated. Pt making good progress and will continue to benefit from OT intervention.   Follow Up Recommendations  SNF;Supervision/Assistance - 24 hour    Equipment Recommendations  None recommended by OT    Recommendations for Other Services      Precautions / Restrictions Precautions Precautions: Fall Precaution Comments: left sided inattention, significant awareness issues       Mobility Bed Mobility Overal bed mobility: Needs Assistance Bed Mobility: Supine to Sit     Supine to sit: Min assist     General bed mobility comments: Min assist to support trunk to come to sitting EOB.   Transfers Overall transfer level: Needs assistance Equipment used: Rolling walker (2 wheeled) Transfers: Sit to/from Stand Sit to Stand: Min assist         General transfer comment: Hand placement cues required    Balance Overall balance assessment: Needs assistance Sitting-balance support: Feet supported;Bilateral upper extremity supported Sitting  balance-[name]: Poor   Postural control: Posterior lean Standing balance support: Bilateral upper extremity supported Standing balance-[name]: Poor Standing balance comment: needs external assist in standing from therapist and AD.          ADL either performed or assessed with clinical judgement   ADL       Grooming: Wash/dry hands;Wash/dry face;Sitting;Cueing for sequencing;Set up        General ADL Comments: min cuing to sequence self care and to scan to the L to obtain needed items               Cognition Arousal/Alertness: Awake/alert Behavior During Therapy: Restless Overall Cognitive Status: Impaired/Different from baseline Area of Impairment: Orientation;Attention;Memory;Following commands;Safety/judgement;Awareness;Problem solving                 Orientation Level: Disoriented to;Time   Memory: Decreased recall of precautions;Decreased short-term memory Following Commands: Follows one step commands with increased time Safety/Judgement: Decreased awareness of safety;Decreased awareness of deficits Awareness: Intellectual Problem Solving: Slow processing;Decreased initiation;Difficulty sequencing;Requires verbal cues;Requires tactile cues General Comments: No awareness of L inattention. Pt requiring increased cues for attending to task and to initiate movements.                   Pertinent Vitals/ Pain       Pain Assessment: Faces Faces Pain Scale: No hurt         Frequency  Min 2X/week        Progress Toward Goals  OT Goals(current goals can now be found in the care plan section)  Progress towards  OT goals: Progressing toward goals  Acute Rehab OT Goals Patient Stated Goal: get out of bed OT Goal Formulation: With patient Time For Goal Achievement: 03/31/19 Potential to Achieve Goals: Good  Plan Discharge plan remains appropriate       AM-PAC OT "6 Clicks" Daily Activity     Outcome Measure   Help from another person  eating meals?: A Little Help from another person taking care of personal grooming?: A Little Help from another person toileting, which includes using toliet, bedpan, or urinal?: A Lot Help from another person bathing (including washing, rinsing, drying)?: A Lot   Help from another person to put on and taking off regular lower body clothing?: A Lot 6 Click Score: 12    End of Session Equipment Utilized During Treatment: Rolling walker  OT Visit Diagnosis: Unsteadiness on feet (R26.81);Muscle weakness (generalized) (M62.81)   Activity Tolerance Patient tolerated treatment well   Patient Left in chair;with call bell/phone within reach;with chair alarm set   Nurse Communication Mobility status        Time: 6440-3474 OT Time Calculation (min): 23 min  Charges: OT General Charges $OT Visit: 1 Visit OT Treatments $Self Care/Home Management : 8-22 mins $Neuromuscular Re-education: 8-22 mins    Darleen Crocker P 03/17/2019, 11:13 AM

## 2019-03-17 NOTE — Plan of Care (Signed)
Patient stable, discussed POC with patient, agreeable with plan, denies question/concerns at this time.  

## 2019-03-17 NOTE — Progress Notes (Signed)
Physical Therapy Treatment Patient Details Name: Joe Perry MRN: [id number] DOB: 05/09/1951 Today's Date: 03/17/2019    History of Present Illness Pt is a 68 yo male s/p  R PCOM and R PCA occlusion s/p emergent thrombectomy on 7/6. Pt admitteed with L sided weakness, R gaze perference, L homonymous hemianopsia. 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    PT Comments    Pt is up in chair when PT arrived, trying to stand as soon as PT greets him.  After careful preparation was able to stand with min assist to control initial LOB, cued transition of LUE to walker and then cues for all turns that went to L side.  Pt is good with min guard to maneuver his walker to R but requires direction and assist to L due to hemianopsia.  Follow acutely for this deficit, to increase control of balance and promote L side awareness and strength. Pt is recommended to rehab due to length of time that will be needed to recover these functional losses.    Follow Up Recommendations  SNF     Equipment Recommendations  None recommended by PT    Recommendations for Other Services       Precautions / Restrictions Precautions Precautions: Fall Precaution Comments: L hemianopsia Restrictions Weight Bearing Restrictions: No    Mobility  Bed Mobility Overal bed mobility: Needs Assistance Bed Mobility: Supine to Sit     Supine to sit: Min assist     General bed mobility comments: up in chair when PT arrived  Transfers Overall transfer level: Needs assistance Equipment used: Rolling walker (2 wheeled) Transfers: Sit to/from Stand Sit to Stand: Min assist         General transfer comment: reminders for use of LUE to transition to the walker  Ambulation/Gait Ambulation/Gait assistance: Min assist;Mod assist Gait Distance (Feet): 300 Feet Assistive device: Rolling walker (2 wheeled) Gait Pattern/deviations: Decreased stride length;Step-through pattern;Drifts right/left;Wide base of support;Trunk  flexed Gait velocity: reduced Gait velocity interpretation: <1.8 ft/sec, indicate of risk for recurrent falls General Gait Details: tends to drift R today and required help to maneuver to the left   Stairs             Wheelchair Mobility    Modified Rankin (Stroke Patients Only) Modified Rankin (Stroke Patients Only) Pre-Morbid Rankin Score: No symptoms Modified Rankin: Moderately severe disability     Balance Overall balance assessment: Needs assistance Sitting-balance support: Feet supported;Single extremity supported Sitting balance-[name]: Fair   Postural control: Posterior lean Standing balance support: Bilateral upper extremity supported;During functional activity Standing balance-[name]: Poor Standing balance comment: RW is very helpful to control L side neglect                            Cognition Arousal/Alertness: Awake/alert Behavior During Therapy: Impulsive Overall Cognitive Status: Impaired/Different from baseline Area of Impairment: Problem solving;Awareness;Safety/judgement;Following commands                 Orientation Level: Disoriented to;Time Current Attention Level: Selective Memory: Decreased recall of precautions;Decreased short-term memory Following Commands: Follows one step commands inconsistently;Follows one step commands with increased time Safety/Judgement: Decreased awareness of safety;Decreased awareness of deficits Awareness: Intellectual Problem Solving: Slow processing;Requires verbal cues General Comments: continual reminders for L side loss of vision      Exercises General Exercises - Lower Extremity Ankle Circles/Pumps: AROM;AAROM;Both;10 reps Hip ABduction/ADduction: AROM;AAROM;Both;10 reps    General Comments  General comments (skin integrity, edema, etc.): pt was able to walk on the hall with time to attend to impending obstacles, but also needs mod assist for turns to L initially gradually  decreasing to min assist to turn to the left      Pertinent Vitals/Pain Pain Assessment: Faces Faces Pain Scale: No hurt    Home Living                      Prior Function            PT Goals (current goals can now be found in the care plan section) Acute Rehab PT Goals Patient Stated Goal: none stated Progress towards PT goals: Progressing toward goals    Frequency    Min 3X/week      PT Plan Current plan remains appropriate    Co-evaluation              AM-PAC PT "6 Clicks" Mobility   Outcome Measure  Help needed turning from your back to your side while in a flat bed without using bedrails?: None Help needed moving from lying on your back to sitting on the side of a flat bed without using bedrails?: A Little Help needed moving to and from a bed to a chair (including a wheelchair)?: A Little Help needed standing up from a chair using your arms (e.g., wheelchair or bedside chair)?: A Little Help needed to walk in hospital room?: A Lot Help needed climbing 3-5 steps with a railing? : Total 6 Click Score: 16    End of Session Equipment Utilized During Treatment: Gait belt Activity Tolerance: Patient tolerated treatment well;Patient limited by fatigue Patient left: in chair;with call bell/phone within reach;with chair alarm set;Other (comment);with restraints reapplied(pt is immediately attempting to stand when PT arrived withou) Nurse Communication: Mobility status;Other (comment)(control of walker to turn L) PT Visit Diagnosis: Muscle weakness (generalized) (M62.81);Difficulty in walking, not elsewhere classified (R26.2);Hemiplegia and hemiparesis Hemiplegia - Right/Left: Left Hemiplegia - dominant/non-dominant: Dominant Hemiplegia - caused by: Cerebral infarction     Time: 6948-5462 PT Time Calculation (min) (ACUTE ONLY): 26 min  Charges:  $Gait Training: 8-22 mins $Therapeutic Exercise: 8-22 mins                    Ramond Dial 03/17/2019,  12:43 PM  Mee Hives, PT MS Acute Rehab Dept. Number: [place] [facility] [place]

## 2019-03-17 NOTE — Progress Notes (Addendum)
  Speech Language Pathology Treatment: Dysphagia  Patient Details Name: JAQUES MINEER MRN: [id number] DOB: 1951-01-30 Today's Date: 03/17/2019 Time: 1121-1129 SLP Time Calculation (min) (ACUTE ONLY): 8 min  Assessment / Plan / Recommendation Clinical Impression  Pt orally prepared, transited and cleared oral cavity of regular texture graham cracker efficiently and no indications of airway compromose with solid or multiple straw sips water. Will upgrade texture to regular from minced, continue thin and meds whole in puree. Following sessions will provide further insight into safety and efficiency as well as target communication-linguistic goals.   HPI HPI: 68 year old male admitted 03/10/2019 with left weakness, unsteady gait, right gaze, left hemianopsia. PMH: CVA, HTN, DM. CT negative. MRI pending      SLP Plan  Continue with current plan of care       Recommendations  Diet recommendations: Regular;Thin liquid Liquids provided via: Cup;Straw Medication Administration: Crushed with puree Supervision: Patient able to self feed;Intermittent supervision to cue for compensatory strategies Compensations: Slow rate;Small sips/bites Postural Changes and/or Swallow Maneuvers: Seated upright 90 degrees                Oral Care Recommendations: Oral care BID Follow up Recommendations: (TBD) SLP Visit Diagnosis: Dysphagia, oropharyngeal phase (R13.12) Plan: Continue with current plan of care                      Houston Siren 03/17/2019, 12:38 PM  Orbie Pyo Colvin Caroli.Ed Risk analyst [phone] Office [phone]

## 2019-03-18 LAB — GLUCOSE, CAPILLARY
Glucose-Capillary: 115 mg/dL — ABNORMAL HIGH (ref 70–99)
Glucose-Capillary: 113 mg/dL — ABNORMAL HIGH (ref 70–99)
Glucose-Capillary: 166 mg/dL — ABNORMAL HIGH (ref 70–99)
Glucose-Capillary: 127 mg/dL — ABNORMAL HIGH (ref 70–99)
Glucose-Capillary: 169 mg/dL — ABNORMAL HIGH (ref 70–99)

## 2019-03-18 MED ORDER — SODIUM CHLORIDE 0.9 % IV SOLN
INTRAVENOUS | Status: DC
  Administered 2019-03-18 – 2019-03-26 (×5): via INTRAVENOUS

## 2019-03-18 NOTE — Progress Notes (Signed)
  Speech Language Pathology Treatment: Dysphagia; Cognitive-Linguistic Patient Details Name: Joe Perry MRN: [id number] DOB: [birth year] Today's Date: 03/18/2019 Time: 6599-3570 SLP Time Calculation (min) (ACUTE ONLY): 30 min  Assessment / Plan / Recommendation Clinical Impression  Pt was seen at bedside during lunch for assessment of diet tolerance and education. MD was present initially, and indicated pt inability to see on his left due to hemianopsia. MD encouraged placement of tray on pt's right. Pt also has left neglect, which further exacerbates this issue. Pt had extended oral prep of solid meat, and required mod+ cues to attend to and clear pocketed pot roast. When self-feeding, pt tends to be impulsive with bolus size and rate. Pt also noted to require ongoing verbal and tactile stimulation to maintain alertness during this session.  All of these issues increase aspiration risk considerably..  Pt was able to tell me his name "Joe Perry Kitchen", and answer simple yes/no questions. He required verbal, visual, and tactile cues to follow one-step directions consistently. Reassessment of cognitive-linguistic function is recommended when pt is more fully and consistently alert.  This was not completed at this time, due to poor alertness following the meal.  Will downgrade diet to mechanical soft with chopped meats, thin liquids. Recommend full supervision and assistance with self feeding due to impulsivity and left neglect.    HPI HPI: 68 year old male admitted 03/10/2019 with left weakness, unsteady gait, right gaze, left hemianopsia. PMH: CVA, HTN, DM. CT negative. MRI Ischemic infarct of the right posterior cerebral artery, territory, roughly corresponding to the ischemic volume.      SLP Plan  Continue with current plan of care       Recommendations  Diet recommendations: Dysphagia 3 (mechanical soft);Thin liquid Liquids provided via: Cup;Straw Medication Administration: Crushed with  puree Supervision: Staff to assist with self feeding;Full supervision/cueing for compensatory strategies Compensations: Slow rate;Small sips/bites;Minimize environmental distractions;Lingual sweep for clearance of pocketing;Follow solids with liquid Postural Changes and/or Swallow Maneuvers: Seated upright 90 degrees;Upright 30-60 min after meal                Oral Care Recommendations: Oral care before and after PO;Staff/trained caregiver to provide oral care Follow up Recommendations: (TBD) SLP Visit Diagnosis: Dysphagia, oropharyngeal phase (R13.12) Plan: Continue with current plan of care       GO               Joe Perry, [facility], CCC-SLP Speech Language Pathologist  Joe Perry 03/18/2019, 2:26 PM

## 2019-03-18 NOTE — Progress Notes (Signed)
Physical Therapy Treatment Patient Details Name: Joe Perry MRN: [id number] DOB: [birth year] Today's Date: 03/18/2019    History of Present Illness 68 yo male s/p  R PCOM and R PCA occlusion s/p emergent thrombectomy on 7/6. Pt admitteed with L sided weakness, R gaze perference, L homonymous hemianopsia. 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    PT Comments    Pt was seen for mobility and strengthening as tolerated with pt demonstrating a definite preference to shift to the R on the walker.  Gave him assistance from R side as he is quite strong in UE's and cannot be managed without pulling to center the walker.  Continues to have unsteady moments in standing with either stopping on the hall and restarting or initial standing balance control.  See acutely for these deficits and to increase awareness of L side, to increase use of walker in an effective strategy to control balance and increase mobility independence.    Follow Up Recommendations  SNF     Equipment Recommendations  None recommended by PT    Recommendations for Other Services Rehab consult     Precautions / Restrictions Precautions Precautions: Fall Precaution Comments: L hemianopsia Restrictions Weight Bearing Restrictions: No    Mobility  Bed Mobility               General bed mobility comments: up in chair when PT arrived  Transfers Overall transfer level: Needs assistance Equipment used: 1 person hand held assist;Rolling walker (2 wheeled) Transfers: Sit to/from Stand Sit to Stand: Min assist;From elevated surface         General transfer comment: physical prompt to place L hand on walker at every transition  Ambulation/Gait Ambulation/Gait assistance: Min assist;Mod assist Gait Distance (Feet): 310 Feet Assistive device: Rolling walker (2 wheeled);1 person hand held assist Gait Pattern/deviations: Decreased stride length;Step-through pattern;Drifts right/left;Wide base of support;Trunk  flexed Gait velocity: reduced Gait velocity interpretation: <1.31 ft/sec, indicative of household ambulator General Gait Details: drifting to L today, continual reinstruction for direction of walker   Stairs             Wheelchair Mobility    Modified Rankin (Stroke Patients Only) Modified Rankin (Stroke Patients Only) Pre-Morbid Rankin Score: No symptoms Modified Rankin: Moderately severe disability     Balance Overall balance assessment: Needs assistance Sitting-balance support: Feet supported;Single extremity supported Sitting balance-[name]: Fair   Postural control: Posterior lean;Left lateral lean Standing balance support: Bilateral upper extremity supported;During functional activity Standing balance-[name]: Poor Standing balance comment: RW is very helpful to control L side neglect                            Cognition Arousal/Alertness: Awake/alert Behavior During Therapy: Impulsive Overall Cognitive Status: Impaired/Different from baseline Area of Impairment: Following commands;Safety/judgement;Awareness;Problem solving                 Orientation Level: Time;Situation Current Attention Level: Selective Memory: Decreased recall of precautions;Decreased short-term memory Following Commands: Follows one step commands inconsistently;Follows one step commands with increased time Safety/Judgement: Decreased awareness of safety;Decreased awareness of deficits Awareness: Intellectual Problem Solving: Slow processing;Difficulty sequencing;Requires verbal cues;Requires tactile cues General Comments: requires dense cues for gait on the hall as pt has L visual field loss      Exercises      General Comments General comments (skin integrity, edema, etc.): dense cues for hallway walking with PT cuing his redirection to straight  on the hall or turning L      Pertinent Vitals/Pain Pain Assessment: Faces Faces Pain Scale: No hurt     Home Living                      Prior Function            PT Goals (current goals can now be found in the care plan section) Acute Rehab PT Goals Patient Stated Goal: none stated Progress towards PT goals: Progressing toward goals    Frequency    Min 3X/week      PT Plan Current plan remains appropriate    Co-evaluation              AM-PAC PT "6 Clicks" Mobility   Outcome Measure  Help needed turning from your back to your side while in a flat bed without using bedrails?: None Help needed moving from lying on your back to sitting on the side of a flat bed without using bedrails?: A Little Help needed moving to and from a bed to a chair (including a wheelchair)?: A Little Help needed standing up from a chair using your arms (e.g., wheelchair or bedside chair)?: A Little Help needed to walk in hospital room?: A Lot Help needed climbing 3-5 steps with a railing? : Total 6 Click Score: 16    End of Session Equipment Utilized During Treatment: Gait belt Activity Tolerance: Patient tolerated treatment well;Patient limited by fatigue Patient left: in chair;with call bell/phone within reach;with chair alarm set Nurse Communication: Mobility status PT Visit Diagnosis: Muscle weakness (generalized) (M62.81);Difficulty in walking, not elsewhere classified (R26.2);Hemiplegia and hemiparesis Hemiplegia - Right/Left: Left Hemiplegia - dominant/non-dominant: Dominant Hemiplegia - caused by: Cerebral infarction     Time: 1884-1660 PT Time Calculation (min) (ACUTE ONLY): 21 min  Charges:  $Gait Training: 8-22 mins                  Ramond Dial 03/18/2019, 1:05 PM   Mee Hives, PT MS Acute Rehab Dept. Number: [place] and [place]

## 2019-03-18 NOTE — Progress Notes (Signed)
STROKE TEAM PROGRESS NOTE   Interval history:  Pt lying in bed, initially sleeping. Speech therapist in room. Pt easily arousable and was able to feed himself lunch with assistance from speech therapist. 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   Vitals:   03/18/19 0007 03/18/19 0433 03/18/19 0852 03/18/19 1146  BP: 136/83 (!) 155/85 (!) 145/86 (!) 117/96  Pulse: 85 91 82 86  Resp: 18 18 18 18   Temp: 98 F (36.7 C) 98 F (36.7 C) 98.1 F (36.7 C) 97.7 F (36.5 C)  TempSrc: Oral Oral Oral Oral  SpO2: 97% 100% 98% 100%  Weight:      Height:        CBC:  Recent Labs  Lab 03/16/19 0512 03/17/19 0756  WBC 5.5 7.3  HGB 14.6 13.8  HCT 43.1 40.8  MCV 90.9 91.7  PLT 267 811    Basic Metabolic Panel:  Recent Labs  Lab 03/16/19 0512 03/17/19 0756  NA 139 137  K 3.5 4.0  CL 104 101  CO2 23 25  GLUCOSE 138* 162*  BUN 8 11  CREATININE 0.80 1.06  CALCIUM 9.2 9.1   Lipid Panel:     Component Value Date/Time   CHOL 159 03/11/2019 0946   TRIG 67 03/11/2019 0946   HDL 45 03/11/2019 0946   CHOLHDL 3.5 03/11/2019 0946   VLDL 13 03/11/2019 0946   LDLCALC 101 (H) 03/11/2019 0946   HgbA1c:  Lab Results  Component Value Date   HGBA1C 9.8 (H) 03/11/2019   Urine Drug Screen:     Component Value Date/Time   LABOPIA NONE DETECTED 11/13/2018 1217   COCAINSCRNUR NONE DETECTED 11/13/2018 1217   LABBENZ NONE DETECTED 11/13/2018 1217   AMPHETMU NONE DETECTED 11/13/2018 1217   THCU NONE DETECTED 11/13/2018 1217   LABBARB NONE DETECTED 11/13/2018 1217    Alcohol Level No results found for: [place] Code Stroke Wo Contrast 03/10/2019 1454 1. No acute finding by CT. Extensive chronic small-vessel ischemic changes throughout the brain as outlined above. Old right frontal infarction. 2. ASPECTS is 10.   Ct Angio Head W Or Wo Contrast Ct Angio Neck W Or Wo Contrast Ct Cerebral Perfusion W Contrast 03/10/2019 1517 1. Fetal type right PCA with proximal occlusion leading to 19  cc of penumbra and no infarct by CT perfusion. There may be a right P1 segment that is also occluded. 2. 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 3. Subjectively advanced left cavernous ICA stenosis. 4. 30-40% atheromatous narrowing at the right ICA bulb.   CT angio 03/10/2019 S/P 4 vessel cerebral arteriogram followed by completev  Revascularization  Of RT PCOM and RT PCA with x 1 pass with 77mm x 92mm embotrap retriver device achieving a TICI 2b revascularization. 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 Motion degraded study. 2. Ischemic infarct of the right posterior cerebral artery territory, roughly corresponding to the ischemic volume identified on the earlier CT perfusion scan. 3. No hemorrhage or mass effect. 4. Chronic ischemic microangiopathy and age advanced atrophy.  PHYSICAL EXAM    Temp:  [97.7 F (36.5 C)-98.5 F (36.9 C)] 97.7 F (36.5 C) (07/14 1146) Pulse Rate:  [81-91] 86 (07/14 1146) Resp:  [18] 18 (07/14 1146) BP: (116-155)/(74-96) 117/96 (07/14 1146) SpO2:  [97 %-100 %] 100 % (07/14 1146)  General - Well nourished, well developed, in no apparent distress.  Ophthalmologic - fundi not visualized due to noncooperation.  Cardiovascular - Regular rate and rhythm.  Mental Status -  Level of arousal, however not able to answer much orientation questions due to aphasia. Language exam showed paucity of speech, expressive aphasia > receptive aphasia, follows most of commands, but not all of them. Perseveration and anomia. Able to repeat.   Cranial Nerves II - XII - II - left hemianopia. III, IV, VI - Extraocular movements intact, however, attending more on the right due to left hemianopia. V - Facial sensation intact bilaterally. VII - Facial movement intact bilaterally. VIII -  Hearing & vestibular intact bilaterally. X - Palate elevates symmetrically. XI - Chin turning & shoulder shrug intact bilaterally. XII - Tongue protrusion intact.  Motor Strength - [name] was normal in all extremities and pronator drift was absent.  Bulk was normal and fasciculations were absent.   Motor Tone - Muscle tone was assessed at the neck and appendages and was normal.  Reflexes - [name] were symmetrical in all extremities and he had no pathological reflexes.  Sensory - Light touch, temperature/pinprick were assessed and were symmetrical.    Coordination - The patient had significant ataxia on the left FTN.  Tremor was absent. Restless of LLE  Gait and Station - pt walked with PT in hallway with walker, stable, no tendency to fall but some difficulty with left hemianopia   ASSESSMENT/PLAN Joe Perry is a 68 y.o. male with history of CVA, HTN, DB presenting with L sided weakness and speech difficulty.   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 Old R frontal infarct.    CTA head & neck - R P1 occlusion. Small BA w/ high grade atheromatous narrowing. L ICA siphon stenosis.  CT perfusion 19 cc penumbra  Cerebral angio TICI 2b revascularization occluded R PCOM and P PCA. Underlying large vessel atherosclerosis   MRI  R PCA infarct    2D Echo EF 60 to 65%.   Recommend 30-day CardioNet monitoring to rule out A. fib as outpatient  HIV neg  LDL 101   HgbA1c 9.8  SCDs for VTE prophylaxis  aspirin 81 mg daily and clopidogrel 75 mg daily prior to admission, now on aspirin 325 mg daily and clopidogrel 75 mg daily. Continue DAPT for 3 months and then Plavix alone.  Therapy recommendations:  SNF  Disposition:  pending   Hx stroke/TIA  11/2018 - presented for dizziness and unsteady, MRI showed right frontal and parietal small  white matter infarcts. No LVO. Extensive SVD.  Put on DAPT.  Patient signed [facility]  01/2016 - admitted for gait disability.  MRI negative for acute infarct.  Old infarcts B BG, thalami, L cerebellum.   Hypertension  Home meds:  None listed  Stable  on Lotensin 10 mg . Long-term BP goal normotensive  Hyperlipidemia  Home meds:  No statin listed  LDL 101, goal < 70  Started Lipitor 40  Continue statin on discharge  Diabetes type II   Home meds:  glucophage 1000 bid  HgbA1c 9.8, goal < 7.0  Uncontrolled  CBGs  SSI  Resume metformin  Close PCP follow-up  Dysphagia . Secondary to stroke . Cleared for D1 nectar thick liquids . Speech on board  Other Stroke Risk Factors  Advanced age  Overweight, Body mass index is 29.3 kg/m., recommend weight loss, diet and exercise as appropriate   Other Active Problems      Hospital day # 8  [name], MD PhD Stroke Neurology 03/18/2019 12:51 PM     To contact Stroke Continuity provider, please refer to [email or web address] After hours, contact General Neurology

## 2019-03-18 NOTE — Plan of Care (Signed)
  Problem: Education: Goal: Knowledge of disease or condition will improve Outcome: Progressing Goal: Knowledge of secondary prevention will improve Outcome: Progressing   Problem: Health Behavior/Discharge Planning: Goal: Ability to manage health-related needs will improve Outcome: Progressing   Problem: Self-Care: Goal: Verbalization of feelings and concerns over difficulty with self-care will improve Outcome: Progressing Goal: Ability to communicate needs accurately will improve Outcome: Progressing   Problem: Nutrition: Goal: Risk of aspiration will decrease Outcome: Progressing   Problem: Ischemic Stroke/TIA Tissue Perfusion: Goal: Complications of ischemic stroke/TIA will be minimized Outcome: Progressing   Problem: Education: Goal: Knowledge of General Education information will improve Description: Including pain rating scale, medication(s)/side effects and non-pharmacologic comfort measures Outcome: Progressing

## 2019-03-19 LAB — BASIC METABOLIC PANEL
Anion gap: 9 (ref 5–15)
BUN: 13 mg/dL (ref 8–23)
CO2: 25 mmol/L (ref 22–32)
Calcium: 9.8 mg/dL (ref 8.9–10.3)
Chloride: 106 mmol/L (ref 98–111)
Creatinine, Ser: 0.88 mg/dL (ref 0.61–1.24)
GFR calc Af Amer: >60 mL/min (ref >60)
GFR calc non Af Amer: >60 mL/min (ref >60)
Glucose, Bld: 98 mg/dL (ref 70–99)
Potassium: 3.9 mmol/L (ref 3.5–5.1)
Sodium: 140 mmol/L (ref 135–145)

## 2019-03-19 LAB — CBC
HCT: 42 % (ref 39.0–52.0)
Hemoglobin: 14.1 g/dL (ref 13.0–17.0)
MCH: 30.5 pg (ref 26.0–34.0)
MCHC: 33.6 g/dL (ref 30.0–36.0)
MCV: 90.7 fL (ref 80.0–100.0)
Platelets: 281 10*3/uL (ref 150–400)
RBC: 4.63 MIL/uL (ref 4.22–5.81)
RDW: 12.4 % (ref 11.5–15.5)
WBC: 8.4 10*3/uL (ref 4.0–10.5)
nRBC: 0 % (ref 0.0–0.2)

## 2019-03-19 LAB — GLUCOSE, CAPILLARY
Glucose-Capillary: 154 mg/dL — ABNORMAL HIGH (ref 70–99)
Glucose-Capillary: 170 mg/dL — ABNORMAL HIGH (ref 70–99)
Glucose-Capillary: 71 mg/dL (ref 70–99)
Glucose-Capillary: 128 mg/dL — ABNORMAL HIGH (ref 70–99)

## 2019-03-19 NOTE — Progress Notes (Signed)
STROKE TEAM PROGRESS NOTE   Interval history:  Patient sleepy, drowsy, however arousable.  Neuro stable, no significant change.  On gentle hydration.  Vitals:   03/19/19 0016 03/19/19 0458 03/19/19 0742 03/19/19 1212  BP: (!) 126/93 128/75 (!) 147/88 (!) 147/78  Pulse: 83 75 75 89  Resp: 18 18 18 18   Temp: 98.4 F (36.9 C) 98 F (36.7 C) 97.9 F (36.6 C) 98 F (36.7 C)  TempSrc: Oral Oral Oral   SpO2: 96% 98% 97%   Weight:      Height:        CBC:  Recent Labs  Lab 03/16/19 0512 03/17/19 0756  WBC 5.5 7.3  HGB 14.6 13.8  HCT 43.1 40.8  MCV 90.9 91.7  PLT 267 329    Basic Metabolic Panel:  Recent Labs  Lab 03/16/19 0512 03/17/19 0756  NA 139 137  K 3.5 4.0  CL 104 101  CO2 23 25  GLUCOSE 138* 162*  BUN 8 11  CREATININE 0.80 1.06  CALCIUM 9.2 9.1   Lipid Panel:     Component Value Date/Time   CHOL 159 03/11/2019 0946   TRIG 67 03/11/2019 0946   HDL 45 03/11/2019 0946   CHOLHDL 3.5 03/11/2019 0946   VLDL 13 03/11/2019 0946   LDLCALC 101 (H) 03/11/2019 0946   HgbA1c:  Lab Results  Component Value Date   HGBA1C 9.8 (H) 03/11/2019   Urine Drug Screen:     Component Value Date/Time   LABOPIA NONE DETECTED 11/13/2018 1217   COCAINSCRNUR NONE DETECTED 11/13/2018 1217   LABBENZ NONE DETECTED 11/13/2018 1217   AMPHETMU NONE DETECTED 11/13/2018 1217   THCU NONE DETECTED 11/13/2018 1217   LABBARB NONE DETECTED 11/13/2018 1217    Alcohol Level No results found for: [place] Head Code Stroke Wo Contrast 03/10/2019 1454 1. No acute finding by CT. Extensive chronic small-vessel ischemic changes throughout the brain as outlined above. Old right frontal infarction. 2. ASPECTS is 10.   Ct Angio Head W Or Wo Contrast Ct Angio Neck W Or Wo Contrast Ct Cerebral Perfusion W Contrast 03/10/2019 1517 1. Fetal type right PCA with proximal occlusion leading to 19 cc of penumbra and no infarct by CT perfusion. There may be a right P1 segment that is also  occluded. 2. 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 3. Subjectively advanced left cavernous ICA stenosis. 4. 30-40% atheromatous narrowing at the right ICA bulb.   CT angio 03/10/2019 S/P 4 vessel cerebral arteriogram followed by completev  Revascularization  Of RT PCOM and RT PCA with x 1 pass with 49mm x 54mm embotrap retriver device achieving a TICI 2b revascularization. 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 Motion degraded study. 2. Ischemic infarct of the right posterior cerebral artery territory, roughly corresponding to the ischemic volume identified on the earlier CT perfusion scan. 3. No hemorrhage or mass effect. 4. Chronic ischemic microangiopathy and age advanced atrophy.  PHYSICAL EXAM    Temp:  [97.6 F (36.4 C)-98.4 F (36.9 C)] 98 F (36.7 C) (07/15 1212) Pulse Rate:  [73-89] 89 (07/15 1212) Resp:  [18] 18 (07/15 1212) BP: (119-147)/(73-93) 147/78 (07/15 1212) SpO2:  [96 %-99 %] 97 % (07/15 0742)  General - Well nourished, well developed, drowsy, sleepy.  Ophthalmologic - fundi not visualized due to  noncooperation.  Cardiovascular - Regular rate and rhythm.  Mental Status -  Drowsy sleepy, but arousable, not able to answer orientation questions. Language exam showed paucity of speech, expressive aphasia > receptive aphasia, follows most of commands, but not all of them. Perseveration and anomia. Able to repeat.   Cranial Nerves II - XII - II - left hemianopia. III, IV, VI - Extraocular movements intact, however, attending more on the right due to left hemianopia. V - Facial sensation intact bilaterally. VII - Facial movement intact bilaterally. VIII - Hearing & vestibular intact bilaterally. X - Palate elevates symmetrically. XI - Chin turning & shoulder shrug intact  bilaterally. XII - Tongue protrusion intact.  Motor Strength - [name] was normal in all extremities and pronator drift was absent.  Bulk was normal and fasciculations were absent.   Motor Tone - Muscle tone was assessed at the neck and appendages and was normal.  Reflexes - [name] were symmetrical in all extremities and he had no pathological reflexes.  Sensory - Light touch, temperature/pinprick were assessed and were symmetrical.    Coordination - The patient had significant ataxia on the left FTN.  Tremor was absent.  Gait and Station - not tested   ASSESSMENT/PLAN [name] is a 68 y.o. male with history of CVA, HTN, DB presenting with L sided weakness and speech difficulty.   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 Old R frontal infarct.    CTA head & neck - R P1 occlusion. Small BA w/ high grade atheromatous narrowing. L ICA siphon stenosis.  CT perfusion 19 cc penumbra  Cerebral angio TICI 2b revascularization occluded R PCOM and P PCA. Underlying large vessel atherosclerosis   MRI  R PCA infarct    2D Echo EF 60 to 65%.   Recommend 30-day OP monitoring to rule out A. fib   HIV neg  LDL 101   HgbA1c 9.8  SCDs for VTE prophylaxis  aspirin 81 mg daily and clopidogrel 75 mg daily prior to admission, now on aspirin 325 mg daily and clopidogrel 75 mg daily. Continue DAPT for 3 months and then Plavix alone.  Therapy recommendations:  SNF  Disposition:  pending   Hx stroke/TIA  11/2018 - presented for dizziness and unsteady, MRI showed right frontal and parietal small white matter infarcts. No LVO. Extensive SVD.  Put on DAPT.  Patient signed [facility]  01/2016 - admitted for gait disability.  MRI negative for acute infarct.  Old infarcts B BG, thalami, L cerebellum.   Hypertension  Home meds:  None  listed  Stable  on Lotensin 10 mg . Long-term BP goal normotensive  Hyperlipidemia  Home meds:  No statin listed  LDL 101, goal < 70  Started Lipitor 40  Continue statin on discharge  Diabetes type II, uncontrolled  Home meds:  glucophage 1000 bid, resumed 7/11  HgbA1c 9.8, goal < 7.0  CBGs  SSI  Close PCP follow-up  Dysphagia . Secondary to stroke . On D3 thin liquids . Speech on board . Gentle hydration, IV fluid at 40 cc  Other Stroke Risk Factors  Advanced age  Overweight, Body mass index is 29.3 kg/m., recommend weight loss, diet and exercise as appropriate   Hospital day # 9  [name], MD PhD Stroke Neurology 03/19/2019 2:48 PM     To contact Stroke Continuity provider,  please refer to [email or web address] After hours, contact General Neurology

## 2019-03-19 NOTE — Progress Notes (Signed)
SLP Cancellation Note  Patient Details Name: Joe Perry MRN: [id number] DOB: [birth year]   Cancelled treatment:       Reason Eval/Treat Not Completed: Patient's level of consciousness. Patient sleeping when SLP arrived and only opened eyes briefly.   [name] [name] 03/19/2019, 3:59 PM   [name], MA, CCC-SLP Speech Therapy [facility] Pager: [phone]

## 2019-03-20 DIAGNOSIS — L299 Pruritus, unspecified: Secondary | ICD-10-CM

## 2019-03-20 LAB — GLUCOSE, CAPILLARY
Glucose-Capillary: 133 mg/dL — ABNORMAL HIGH (ref 70–99)
Glucose-Capillary: 149 mg/dL — ABNORMAL HIGH (ref 70–99)
Glucose-Capillary: 118 mg/dL — ABNORMAL HIGH (ref 70–99)
Glucose-Capillary: 114 mg/dL — ABNORMAL HIGH (ref 70–99)

## 2019-03-20 MED ORDER — DIAZEPAM 5 MG PO TABS
5.0000 mg | ORAL_TABLET | Freq: Once | ORAL | Status: AC
  Administered 2019-03-20: 5 mg via ORAL
  Filled 2019-03-20: qty 1

## 2019-03-20 MED ORDER — DIPHENHYDRAMINE HCL 25 MG PO CAPS
50.0000 mg | ORAL_CAPSULE | Freq: Once | ORAL | Status: AC
  Administered 2019-03-20: 50 mg via ORAL
  Filled 2019-03-20: qty 2

## 2019-03-20 NOTE — Progress Notes (Signed)
STROKE TEAM PROGRESS NOTE   Interval history:  Patient lying in bed, awake alert, more interactive.  However restless in bed, seems to be itching all over.  However, he cannot give definite answer for itching or not.  Given the suspicion, will discontinue Plavix and the gabapentin, give 1 dose of Benadryl and Valium.  Continue Benadryl PRN.  Vitals:   03/20/19 0800 03/20/19 0829 03/20/19 0926 03/20/19 1100  BP: (!) 239/219 (!) 239/119 (!) 118/105 116/81  Pulse: (!) 119 (!) 119 (!) 124 89  Resp: 18 20 20 20   Temp: 98.4 F (36.9 C) 98 F (36.7 C)  (!) 97.5 F (36.4 C)  TempSrc: Axillary Axillary  Oral  SpO2: 100% 100%  100%  Weight:      Height:        CBC:  Recent Labs  Lab 03/17/19 0756 03/19/19 1520  WBC 7.3 8.4  HGB 13.8 14.1  HCT 40.8 42.0  MCV 91.7 90.7  PLT 204 916    Basic Metabolic Panel:  Recent Labs  Lab 03/17/19 0756 03/19/19 1520  NA 137 140  K 4.0 3.9  CL 101 106  CO2 25 25  GLUCOSE 162* 98  BUN 11 13  CREATININE 1.06 0.88  CALCIUM 9.1 9.8   Lipid Panel:     Component Value Date/Time   CHOL 159 03/11/2019 0946   TRIG 67 03/11/2019 0946   HDL 45 03/11/2019 0946   CHOLHDL 3.5 03/11/2019 0946   VLDL 13 03/11/2019 0946   LDLCALC 101 (H) 03/11/2019 0946   HgbA1c:  Lab Results  Component Value Date   HGBA1C 9.8 (H) 03/11/2019   Urine Drug Screen:     Component Value Date/Time   LABOPIA NONE DETECTED 11/13/2018 1217   COCAINSCRNUR NONE DETECTED 11/13/2018 1217   LABBENZ NONE DETECTED 11/13/2018 1217   AMPHETMU NONE DETECTED 11/13/2018 1217   THCU NONE DETECTED 11/13/2018 1217   LABBARB NONE DETECTED 11/13/2018 1217    Alcohol Level No results found for: [place] Code Stroke Wo Contrast 03/10/2019 1454 1. No acute finding by CT. Extensive chronic small-vessel ischemic changes throughout the brain as outlined above. Old right frontal infarction. 2. ASPECTS is 10.   Ct Angio Head W Or Wo Contrast Ct Angio Neck W Or Wo  Contrast Ct Cerebral Perfusion W Contrast 03/10/2019 1517 1. Fetal type right PCA with proximal occlusion leading to 19 cc of penumbra and no infarct by CT perfusion. There may be a right P1 segment that is also occluded. 2. 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 3. Subjectively advanced left cavernous ICA stenosis. 4. 30-40% atheromatous narrowing at the right ICA bulb.   CT angio 03/10/2019 S/P 4 vessel cerebral arteriogram followed by completev  Revascularization  Of RT PCOM and RT PCA with x 1 pass with 4mm x 57mm embotrap retriver device achieving a TICI 2b revascularization. 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 Motion degraded study. 2. Ischemic infarct of the right posterior cerebral artery territory, roughly corresponding to the ischemic volume identified on the earlier CT perfusion scan. 3. No hemorrhage or mass effect. 4. Chronic ischemic microangiopathy and age advanced atrophy.  PHYSICAL EXAM    Temp:  [97.5 F (36.4 C)-98.4 F (36.9 C)] 97.5 F (36.4 C) (07/16 1100) Pulse Rate:  [71-124] 89 (07/16 1100)  Resp:  [16-20] 20 (07/16 1100) BP: (116-239)/(74-219) 116/81 (07/16 1100) SpO2:  [99 %-100 %] 100 % (07/16 1100)  General - Well nourished, well developed, restless concerning for itchiness all over  Ophthalmologic - fundi not visualized due to noncooperation.  Cardiovascular - Regular rate and rhythm.  Mental Status -  Awake alert, orientated to self, place, but not orientated to age, time. Language exam showed more fluent speech than before, able to follow most commands, but not all of them.  Intermittent paraphasic error and word finding difficulty.  Cranial Nerves II - XII - II - left hemianopia. III, IV, VI - Extraocular movements intact, however, attending more on the right  due to left hemianopia. V - Facial sensation intact bilaterally. VII - Facial movement intact bilaterally. VIII - Hearing & vestibular intact bilaterally. X - Palate elevates symmetrically. XI - Chin turning & shoulder shrug intact bilaterally. XII - Tongue protrusion intact.  Motor Strength - [name] was normal in all extremities and pronator drift was absent.  Bulk was normal and fasciculations were absent.   Motor Tone - Muscle tone was assessed at the neck and appendages and was normal.  Reflexes - [name] were symmetrical in all extremities and he had no pathological reflexes.  Sensory - Light touch, temperature/pinprick were assessed and were symmetrical.    Coordination - The patient had significant ataxia on the left FTN.  Tremor was absent.  Gait and Station - not tested   ASSESSMENT/PLAN Joe Perry is a 68 y.o. male with history of CVA, HTN, DB presenting with L sided weakness and speech difficulty.   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 Old R frontal infarct.    CTA head & neck - R P1 occlusion. Small BA w/ high grade atheromatous narrowing. L ICA siphon stenosis.  CT perfusion 19 cc penumbra  Cerebral angio TICI 2b revascularization occluded R PCOM and P PCA. Underlying large vessel atherosclerosis   MRI  R PCA infarct    2D Echo EF 60 to 65%.   Recommend 30-day OP monitoring to rule out A. fib   HIV neg  LDL 101   HgbA1c 9.8  SCDs for VTE prophylaxis  aspirin 81 mg daily and clopidogrel 75 mg daily prior to admission, now on aspirin 325 mg daily and clopidogrel 75 mg daily. Continue DAPT for 3 months and then Plavix alone.  Therapy recommendations:  SNF  Disposition:  pending   Hx stroke/TIA  11/2018 - presented for dizziness and unsteady, MRI showed right frontal and parietal small white matter  infarcts. No LVO. Extensive SVD.  Put on DAPT.  Patient signed [facility]  01/2016 - admitted for gait disability.  MRI negative for acute infarct.  Old infarcts B BG, thalami, L cerebellum.   Restless  Concerning for itchiness all over  Patient is scratching his legs and genital area  Benadryl PRN  Give with Benadryl and Valium 1 dose  Stop Plavix and gabapentin  Close monitoring  Hypertension  Home meds:  None listed  Stable  on Lotensin 10 mg . Long-term BP goal normotensive  Hyperlipidemia  Home meds:  No statin listed  LDL 101, goal < 70  Started Lipitor 40  Continue statin on discharge  Diabetes type II, uncontrolled  Home meds:  glucophage 1000 bid, resumed 7/11  HgbA1c 9.8, goal < 7.0  Hyperglycemia  improved  CBGs  SSI  Close PCP follow-up  Dysphagia . Secondary to stroke . On D3 thin liquids . Speech on board . Gentle hydration, IV fluid at 40 cc  Other Stroke Risk Factors  Advanced age  Overweight, Body mass index is 29.3 kg/m., recommend weight loss, diet and exercise as appropriate   Hospital day # 10  Joe Hawking, MD PhD Stroke Neurology 03/20/2019 2:37 PM     To contact Stroke Continuity provider, please refer to [email or web address] After hours, contact General Neurology

## 2019-03-20 NOTE — Plan of Care (Signed)
Progressing towards goals

## 2019-03-20 NOTE — Progress Notes (Signed)
Physical Therapy Treatment Patient Details Name: Joe Perry MRN: [id number] DOB: 01-15-1951 Today's Date: 03/20/2019    History of Present Illness 68 yo male s/p  R PCOM and R PCA occlusion s/p emergent thrombectomy on 7/6. Pt admitteed with L sided weakness, R gaze perference, L homonymous hemianopsia. 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    PT Comments    Patient seen for mobility progression. Pt presents with impaired balance and visual/cognitive deficits increasing risk for falls. Continue to progress as tolerated with anticipated d/c to SNF for further skilled PT services.     Follow Up Recommendations  SNF     Equipment Recommendations  None recommended by PT    Recommendations for Other Services       Precautions / Restrictions Precautions Precautions: Fall Precaution Comments: L hemianopsia Restrictions Weight Bearing Restrictions: No    Mobility  Bed Mobility Overal bed mobility: Needs Assistance Bed Mobility: Supine to Sit;Sit to Supine     Supine to sit: Supervision Sit to supine: Supervision   General bed mobility comments: supervision for safety  Transfers Overall transfer level: Needs assistance Equipment used: 1 person hand held assist;Rolling walker (2 wheeled) Transfers: Sit to/from [facility] Sit to Stand: Min assist Stand pivot transfers: Mod assist       General transfer comment: assist to steady and to power up into standing; increased assistance required for pivot transfer to [facility] using RW; pt is unable to safely use RW and so HHA to complete transfer safely  Ambulation/Gait Ambulation/Gait assistance: Min assist;Mod assist Gait Distance (Feet): 160 Feet Assistive device: IV Pole(assist at trunk with gait belt) Gait Pattern/deviations: Decreased stride length;Step-through pattern;Drifts right/left;Wide base of support     General Gait Details: attempted gait with RW initially however pt unable to use safely so  assistance provided with gait belt at trunk and to guide IV pole with pt holding onto IV with bilat UE; assistance required to navigat environment and to weight shift   Stairs             Wheelchair Mobility    Modified Rankin (Stroke Patients Only) Modified Rankin (Stroke Patients Only) Pre-Morbid Rankin Score: No symptoms Modified Rankin: Moderately severe disability     Balance Overall balance assessment: Needs assistance Sitting-balance support: Feet supported Sitting balance-[name]: Fair     Standing balance support: Bilateral upper extremity supported;During functional activity Standing balance-[name]: Poor                              Cognition Arousal/Alertness: Awake/alert Behavior During Therapy: Impulsive Overall Cognitive Status: Impaired/Different from baseline Area of Impairment: Following commands;Safety/judgement;Problem solving;Orientation                 Orientation Level: Time;Situation     Following Commands: Follows one step commands inconsistently;Follows one step commands with increased time Safety/Judgement: Decreased awareness of safety;Decreased awareness of deficits   Problem Solving: Difficulty sequencing;Requires verbal cues;Requires tactile cues        Exercises      General Comments        Pertinent Vitals/Pain Pain Assessment: No/denies pain    Home Living                      Prior Function            PT Goals (current goals can now be found in the care plan section) Progress  towards PT goals: Progressing toward goals    Frequency    Min 3X/week      PT Plan Current plan remains appropriate    Co-evaluation              AM-PAC PT "6 Clicks" Mobility   Outcome Measure  Help needed turning from your back to your side while in a flat bed without using bedrails?: None Help needed moving from lying on your back to sitting on the side of a flat bed without using  bedrails?: A Little Help needed moving to and from a bed to a chair (including a wheelchair)?: A Little Help needed standing up from a chair using your arms (e.g., wheelchair or bedside chair)?: A Little Help needed to walk in hospital room?: A Lot Help needed climbing 3-5 steps with a railing? : Total 6 Click Score: 16    End of Session Equipment Utilized During Treatment: Gait belt Activity Tolerance: Patient tolerated treatment well Patient left: with call bell/phone within reach;in bed;with bed alarm set;with nursing/sitter in room Nurse Communication: Mobility status PT Visit Diagnosis: Muscle weakness (generalized) (M62.81);Difficulty in walking, not elsewhere classified (R26.2);Hemiplegia and hemiparesis Hemiplegia - Right/Left: Left Hemiplegia - dominant/non-dominant: Dominant Hemiplegia - caused by: Cerebral infarction     Time: 9179-1505 PT Time Calculation (min) (ACUTE ONLY): 27 min  Charges:  $Gait Training: 8-22 mins $Therapeutic Activity: 8-22 mins                     Earney Navy, PTA Acute Rehabilitation Services Pager: [phone] Office: [phone]     Darliss Cheney 03/20/2019, 5:07 PM

## 2019-03-20 NOTE — Progress Notes (Signed)
Chart reviewed for LOS; B Chandan Fly RN,MHA,BSN Advanced Care Supervisor [phone] 

## 2019-03-21 LAB — GLUCOSE, CAPILLARY
Glucose-Capillary: 94 mg/dL (ref 70–99)
Glucose-Capillary: 154 mg/dL — ABNORMAL HIGH (ref 70–99)
Glucose-Capillary: 73 mg/dL (ref 70–99)
Glucose-Capillary: 80 mg/dL (ref 70–99)

## 2019-03-21 MED ORDER — ENOXAPARIN SODIUM 40 MG/0.4ML ~~LOC~~ SOLN
40.0000 mg | SUBCUTANEOUS | Status: DC
  Administered 2019-03-21 – 2019-06-02 (×73): 40 mg via SUBCUTANEOUS
  Filled 2019-03-21 (×75): qty 0.4

## 2019-03-21 NOTE — Plan of Care (Signed)
  Problem: Education: Goal: Knowledge of disease or condition will improve Outcome: Progressing   Problem: Nutrition: Goal: Risk of aspiration will decrease Outcome: Progressing   Problem: Ischemic Stroke/TIA Tissue Perfusion: Goal: Complications of ischemic stroke/TIA will be minimized Outcome: Progressing   Problem: Education: Goal: Knowledge of General Education information will improve Description: Including pain rating scale, medication(s)/side effects and non-pharmacologic comfort measures Outcome: Progressing   Problem: Education: Goal: Knowledge of secondary prevention will improve Outcome: Not Progressing   Problem: Health Behavior/Discharge Planning: Goal: Ability to manage health-related needs will improve Outcome: Not Progressing   Problem: Self-Care: Goal: Verbalization of feelings and concerns over difficulty with self-care will improve Outcome: Not Progressing Goal: Ability to communicate needs accurately will improve Outcome: Not Progressing

## 2019-03-21 NOTE — Progress Notes (Signed)
STROKE TEAM PROGRESS NOTE   Interval history:  Patient sitting in chair, calm, does not seem be itching anymore. He denies any itching. He still has left LE restless which was not new. I think his itchiness may be due to the side effect of plavix which has been discontinued.  Vitals:   03/21/19 0408 03/21/19 0730 03/21/19 1137 03/21/19 1137  BP: 126/76 130/78 131/88 131/88  Pulse: 95 96 98 99  Resp: 20 18 18 18   Temp: 98.4 F (36.9 C) 98.7 F (37.1 C) 98.6 F (37 C) 98.6 F (37 C)  TempSrc: Oral Oral Oral Oral  SpO2: 100% 98% 96% 97%  Weight:      Height:        CBC:  Recent Labs  Lab 03/17/19 0756 03/19/19 1520  WBC 7.3 8.4  HGB 13.8 14.1  HCT 40.8 42.0  MCV 91.7 90.7  PLT 204 654    Basic Metabolic Panel:  Recent Labs  Lab 03/17/19 0756 03/19/19 1520  NA 137 140  K 4.0 3.9  CL 101 106  CO2 25 25  GLUCOSE 162* 98  BUN 11 13  CREATININE 1.06 0.88  CALCIUM 9.1 9.8   Lipid Panel:     Component Value Date/Time   CHOL 159 03/11/2019 0946   TRIG 67 03/11/2019 0946   HDL 45 03/11/2019 0946   CHOLHDL 3.5 03/11/2019 0946   VLDL 13 03/11/2019 0946   LDLCALC 101 (H) 03/11/2019 0946   HgbA1c:  Lab Results  Component Value Date   HGBA1C 9.8 (H) 03/11/2019   Urine Drug Screen:     Component Value Date/Time   LABOPIA NONE DETECTED 11/13/2018 1217   COCAINSCRNUR NONE DETECTED 11/13/2018 1217   LABBENZ NONE DETECTED 11/13/2018 1217   AMPHETMU NONE DETECTED 11/13/2018 1217   THCU NONE DETECTED 11/13/2018 1217   LABBARB NONE DETECTED 11/13/2018 1217    Alcohol Level No results found for: [place] Head Code Stroke Wo Contrast 03/10/2019 1454 1. No acute finding by CT. Extensive chronic small-vessel ischemic changes throughout the brain as outlined above. Old right frontal infarction. 2. ASPECTS is 10.   Ct Angio Head W Or Wo Contrast Ct Angio Neck W Or Wo Contrast Ct Cerebral Perfusion W Contrast 03/10/2019 1517 1. Fetal type right PCA with proximal  occlusion leading to 19 cc of penumbra and no infarct by CT perfusion. There may be a right P1 segment that is also occluded. 2. 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 3. Subjectively advanced left cavernous ICA stenosis. 4. 30-40% atheromatous narrowing at the right ICA bulb.   CT angio 03/10/2019 S/P 4 vessel cerebral arteriogram followed by completev  Revascularization  Of RT PCOM and RT PCA with x 1 pass with 36mm x 37mm embotrap retriver device achieving a TICI 2b revascularization. 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 Motion degraded study. 2. Ischemic infarct of the right posterior cerebral artery territory, roughly corresponding to the ischemic volume identified on the earlier CT perfusion scan. 3. No hemorrhage or mass effect. 4. Chronic ischemic microangiopathy and age advanced atrophy.  PHYSICAL EXAM    Temp:  [97.7 F (36.5 C)-98.7 F (37.1 C)] 98.6 F (37 C) (07/17 1137) Pulse Rate:  [87-99] 99 (07/17 1137) Resp:  [16-20] 18 (07/17 1137) BP: (122-147)/(74-88) 131/88 (07/17 1137) SpO2:  [  96 %-100 %] 97 % (07/17 1137)  General - Well nourished, well developed, restless concerning for itchiness all over  Ophthalmologic - fundi not visualized due to noncooperation.  Cardiovascular - Regular rate and rhythm.  Mental Status -  Awake alert, orientated to self, place, but not orientated to age, time. Language exam showed more fluent speech than before, able to follow most commands, but not all of them.  Intermittent paraphasic error and word finding difficulty.  Cranial Nerves II - XII - II - left hemianopia. III, IV, VI - Extraocular movements intact, however, attending more on the right due to left hemianopia. V - Facial sensation intact bilaterally. VII - Facial movement intact  bilaterally. VIII - Hearing & vestibular intact bilaterally. X - Palate elevates symmetrically. XI - Chin turning & shoulder shrug intact bilaterally. XII - Tongue protrusion intact.  Motor Strength - [name] was normal in all extremities and pronator drift was absent.  Bulk was normal and fasciculations were absent.   Motor Tone - Muscle tone was assessed at the neck and appendages and was normal.  Reflexes - [name] were symmetrical in all extremities and he had no pathological reflexes.  Sensory - Light touch, temperature/pinprick were assessed and were symmetrical.    Coordination - The patient had significant ataxia on the left FTN.  Tremor was absent.  Gait and Station - not tested   ASSESSMENT/PLAN [name] is a 68 y.o. male with history of CVA, HTN, DB presenting with L sided weakness and speech difficulty.   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 Old R frontal infarct.    CTA head & neck - R P1 occlusion. Small BA w/ high grade atheromatous narrowing. L ICA siphon stenosis.  CT perfusion 19 cc penumbra  Cerebral angio TICI 2b revascularization occluded R PCOM and P PCA. Underlying large vessel atherosclerosis   MRI  R PCA infarct    2D Echo EF 60 to 65%.   Recommend 30-day OP monitoring to rule out A. fib   HIV neg  LDL 101   HgbA1c 9.8  SCDs for VTE prophylaxis  aspirin 81 mg daily and clopidogrel 75 mg daily prior to admission, put on aspirin 325 mg daily and clopidogrel 75 mg daily. However, developed itchiness, plavix was discontinued. Now on ASA 325mg . Continue ASA 325 on discharge.  Therapy recommendations:  SNF  Disposition:  pending - SNF felt pt too high functioning. Pt may dc home as per CM  Hx stroke/TIA  11/2018 - presented for dizziness and unsteady, MRI showed right frontal and parietal small  white matter infarcts. No LVO. Extensive SVD.  Put on DAPT.  Patient signed [facility]  01/2016 - admitted for gait disability.  MRI negative for acute infarct.  Old infarcts B BG, thalami, L cerebellum.   Itchiness, likely due to side effect from plavix  Concerning for itchiness all over 03/20/19 as he is scratching his legs and genital area  Benadryl PRN  Give with Benadryl and Valium 1 dose 7/16  discontinued Plavix and gabapentin  Much improving 7/17  Hypertension  Home meds:  None listed  Stable  on Lotensin 10 mg . Long-term BP goal normotensive  Hyperlipidemia  Home meds:  No statin listed  LDL 101, goal < 70  Started Lipitor 40  Continue statin on discharge  Diabetes type II, uncontrolled  Home meds:  glucophage 1000 bid, resumed 7/11  HgbA1c 9.8, goal < 7.0  Hyperglycemia improved  CBGs  SSI  Close PCP follow-up  Dysphagia . Secondary to stroke . On D3 thin liquids . Speech on board . Gentle hydration, IV fluid at 40 cc  Other Stroke Risk Factors  Advanced age  Overweight, Body mass index is 29.3 kg/m., recommend weight loss, diet and exercise as appropriate   Hospital day # 11  [name], MD PhD Stroke Neurology 03/21/2019 2:42 PM     To contact Stroke Continuity provider, please refer to [email or web address] After hours, contact General Neurology

## 2019-03-21 NOTE — Progress Notes (Signed)
Occupational Therapy Treatment Patient Details Name: Joe Perry MRN: [id number] DOB: 1951/04/18 Today's Date: 03/21/2019    History of present illness 68 yo male s/p  R PCOM and R PCA occlusion s/p emergent thrombectomy on 7/6. Pt admitteed with L sided weakness, R gaze perference, L homonymous hemianopsia. 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   OT comments  Pt is still demonstrating L neglect, L hemianopsia, L UE/LE weakness, cognitive deficits and impaired balance. Pt is still requiring mod-max skilled cueing for attention to L side and for sequencing transfers, as well as min-mod lifting assistance. Pt is oriented to himself only and remains confused, requiring redirection to task often. Pt is a very high fall risk d/t aforementioned deficits and unsafe to d/c home. Recommend SNF at d/c to continue rehab efforts. OT will continue to follow.    Follow Up Recommendations  SNF;Supervision/Assistance - 24 hour    Equipment Recommendations  Other (comment)(defer to next venue)    Recommendations for Other Services Rehab consult    Precautions / Restrictions Precautions Precautions: Fall Precaution Comments: L hemianopsia Restrictions Weight Bearing Restrictions: No       Mobility Bed Mobility Overal bed mobility: Needs Assistance Bed Mobility: Supine to Sit;Sit to Supine     Supine to sit: Min guard Sit to supine: Min guard   General bed mobility comments: min guard for safety, pt impulsive with transfers  Transfers Overall transfer level: Needs assistance Equipment used: 1 person hand held assist Transfers: Sit to/from [facility] Sit to Stand: Min assist Stand pivot transfers: Min assist       General transfer comment: Min A to power up from EOB and to complete stand pivot transfer    Balance Overall balance assessment: Needs assistance Sitting-balance support: Feet supported Sitting balance-[name]: Fair Sitting balance - Comments:  posterior lean while sitting EOB Postural control: Posterior lean;Left lateral lean Standing balance support: Bilateral upper extremity supported;During functional activity Standing balance-[name]: Poor                             ADL either performed or assessed with clinical judgement   ADL Overall ADL's : Needs assistance/impaired                                     Functional mobility during ADLs: Minimal assistance;Cueing for safety;Cueing for sequencing       Vision   Vision Assessment?: Vision impaired- to be further tested in functional context Ocular Range of Motion: Restricted on the left Tracking/Visual Pursuits: Left eye does not track laterally;Impaired - to be further tested in functional context Visual Fields: Left visual field deficit Additional Comments: Max cueing for Lt attention, to turn head to L and to attend to items in L visual field          Cognition Arousal/Alertness: Awake/alert Behavior During Therapy: Impulsive Overall Cognitive Status: Impaired/Different from baseline Area of Impairment: Following commands;Safety/judgement;Problem solving;Orientation                 Orientation Level: Time;Situation Current Attention Level: Selective Memory: Decreased recall of precautions;Decreased short-term memory Following Commands: Follows one step commands inconsistently;Follows one step commands with increased time Safety/Judgement: Decreased awareness of safety;Decreased awareness of deficits Awareness: Intellectual Problem Solving: Difficulty sequencing;Requires verbal cues;Requires tactile cues General Comments: Pt required max cueing for L attention  General Comments max cueing for Lt attention/following commands    Pertinent Vitals/ Pain       Pain Assessment: No/denies pain Faces Pain Scale: Hurts little more Pain Intervention(s): Monitored during session;Repositioned   Frequency  Min  2X/week        Progress Toward Goals  OT Goals(current goals can now be found in the care plan section)  Progress towards OT goals: Progressing toward goals  Acute Rehab OT Goals Patient Stated Goal: none stated OT Goal Formulation: With patient Time For Goal Achievement: 03/31/19 Potential to Achieve Goals: Good  Plan Discharge plan remains appropriate       AM-PAC OT "6 Clicks" Daily Activity     Outcome Measure   Help from another person eating meals?: A Little Help from another person taking care of personal grooming?: A Little Help from another person toileting, which includes using toliet, bedpan, or urinal?: A Lot Help from another person bathing (including washing, rinsing, drying)?: A Little Help from another person to put on and taking off regular upper body clothing?: A Little Help from another person to put on and taking off regular lower body clothing?: A Lot 6 Click Score: 16    End of Session    OT Visit Diagnosis: Unsteadiness on feet (R26.81);Muscle weakness (generalized) (M62.81)   Activity Tolerance Patient tolerated treatment well   Patient Left in chair;with call bell/phone within reach;with chair alarm set   Nurse Communication Mobility status        Time: 9396-8864 OT Time Calculation (min): 16 min  Charges: OT General Charges $OT Visit: 1 Visit OT Treatments $Self Care/Home Management : 8-22 mins  Curtis Sites OTR/L  03/21/2019, 11:18 AM

## 2019-03-21 NOTE — Progress Notes (Signed)
  Speech Language Pathology Treatment: Dysphagia;Cognitive-Linquistic  Patient Details Name: Joe Perry MRN: [id number] DOB: 20-Sep-1950 Today's Date: 03/21/2019 Time: 6811-5726 SLP Time Calculation (min) (ACUTE ONLY): 32 min  Assessment / Plan / Recommendation Clinical Impression  Pt restless with apparent leg tchiness ( RN aware). Treatment focused on dysphagia during breakfast and intervention directed at differentiation of cognitive and language abilities. Aphasia more evident today: he is fluent in phrases with frequent hesitations, dysnomia, paraphasias and occasional neologisms. His [place] accent and significant dysarthria of low intensity and reduced articulation presented challenges with differentiation. Phrase completion cues were effective less than 50% but phonemic effective. Significant left neglect requiring max verbal/tactile cues to follow commands or attend to activity on his left.  No pocketing with trial of Dys 3 texture noted today however sustained attention at the time was adequate. No s/s aspiration with food or consecutive straw sips of water and juice. If he is sleepier or unable to attend chances of left sided pocketing are higher. Will continue to require full assist with meals.    HPI HPI: 68 year old male admitted 03/10/2019 with left weakness, unsteady gait, right gaze, left hemianopsia. PMH: CVA, HTN, DM. CT negative. MRI Ischemic infarct of the right posterior cerebral artery, territory, roughly corresponding to the ischemic volume.      SLP Plan  Continue with current plan of care       Recommendations  Diet recommendations: Dysphagia 3 (mechanical soft);Thin liquid Liquids provided via: Cup;Straw Medication Administration: Crushed with puree Supervision: Staff to assist with self feeding;Full supervision/cueing for compensatory strategies Compensations: Slow rate;Small sips/bites;Minimize environmental distractions;Lingual sweep for clearance of  pocketing;Follow solids with liquid Postural Changes and/or Swallow Maneuvers: Seated upright 90 degrees;Upright 30-60 min after meal                Oral Care Recommendations: Oral care before and after PO;Oral care BID Follow up Recommendations: Inpatient Rehab SLP Visit Diagnosis: Cognitive communication deficit (R41.841);Aphasia (R47.01);Dysphagia, unspecified (R13.10) Plan: Continue with current plan of care       GO                Houston Siren 03/21/2019, 9:38 AM  Orbie Pyo Colvin Caroli.Ed Risk analyst [phone] Office [phone]

## 2019-03-22 LAB — GLUCOSE, CAPILLARY
Glucose-Capillary: 104 mg/dL — ABNORMAL HIGH (ref 70–99)
Glucose-Capillary: 124 mg/dL — ABNORMAL HIGH (ref 70–99)
Glucose-Capillary: 71 mg/dL (ref 70–99)
Glucose-Capillary: 75 mg/dL (ref 70–99)
Glucose-Capillary: 82 mg/dL (ref 70–99)
Glucose-Capillary: 83 mg/dL (ref 70–99)

## 2019-03-22 LAB — CBC
HCT: 41.5 % (ref 39.0–52.0)
Hemoglobin: 13.8 g/dL (ref 13.0–17.0)
MCH: 30.8 pg (ref 26.0–34.0)
MCHC: 33.3 g/dL (ref 30.0–36.0)
MCV: 92.6 fL (ref 80.0–100.0)
Platelets: 284 10*3/uL (ref 150–400)
RBC: 4.48 MIL/uL (ref 4.22–5.81)
RDW: 12.4 % (ref 11.5–15.5)
WBC: 6.1 10*3/uL (ref 4.0–10.5)
nRBC: 0 % (ref 0.0–0.2)

## 2019-03-22 LAB — BASIC METABOLIC PANEL
Anion gap: 12 (ref 5–15)
BUN: 14 mg/dL (ref 8–23)
CO2: 22 mmol/L (ref 22–32)
Calcium: 9.5 mg/dL (ref 8.9–10.3)
Chloride: 104 mmol/L (ref 98–111)
Creatinine, Ser: 0.99 mg/dL (ref 0.61–1.24)
GFR calc Af Amer: >60 mL/min (ref >60)
GFR calc non Af Amer: >60 mL/min (ref >60)
Glucose, Bld: 104 mg/dL — ABNORMAL HIGH (ref 70–99)
Potassium: 3.9 mmol/L (ref 3.5–5.1)
Sodium: 138 mmol/L (ref 135–145)

## 2019-03-22 MED ORDER — TICAGRELOR 90 MG PO TABS
90.0000 mg | ORAL_TABLET | Freq: Two times a day (BID) | ORAL | Status: DC
  Administered 2019-03-22 – 2019-06-03 (×147): 90 mg via ORAL
  Filled 2019-03-22 (×147): qty 1

## 2019-03-22 MED ORDER — ASPIRIN EC 81 MG PO TBEC
81.0000 mg | DELAYED_RELEASE_TABLET | Freq: Every day | ORAL | Status: DC
  Administered 2019-03-23 – 2019-06-03 (×73): 81 mg via ORAL
  Filled 2019-03-22 (×72): qty 1

## 2019-03-22 MED ORDER — HYDROXYZINE HCL 10 MG PO TABS
10.0000 mg | ORAL_TABLET | Freq: Three times a day (TID) | ORAL | Status: DC | PRN
  Administered 2019-03-23 (×2): 10 mg via ORAL
  Filled 2019-03-22 (×3): qty 1

## 2019-03-22 NOTE — Progress Notes (Signed)
Physical Therapy Treatment Patient Details Name: Joe Perry MRN: [id number] DOB: 05/28/1951 Today's Date: 03/22/2019    History of Present Illness 68 yo male s/p  R PCOM and R PCA occlusion s/p emergent thrombectomy on 7/6. Pt admitteed with L sided weakness, R gaze perference, L homonymous hemianopsia. 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    PT Comments    Patient motivated for OOB mobility. Requires frequent cueing for safety with mobility. Patient with L inattention throughout session requiring up to Mod A with RW during gait due to reduced safety awareness, instability, and poor obstacle navigation. PT to continue to follow.     Follow Up Recommendations  SNF     Equipment Recommendations  None recommended by PT    Recommendations for Other Services       Precautions / Restrictions Precautions Precautions: Fall Precaution Comments: L hemianopsia Restrictions Weight Bearing Restrictions: No    Mobility  Bed Mobility Overal bed mobility: Needs Assistance Bed Mobility: Supine to Sit     Supine to sit: Min guard     General bed mobility comments: min guard for safety, pt impulsive with transfers  Transfers Overall transfer level: Needs assistance Equipment used: Rolling walker (2 wheeled) Transfers: Sit to/from [facility] Sit to Stand: Min assist Stand pivot transfers: Min assist       General transfer comment: Min A for stability; unaware of L UE and need to use it at [employer]  Ambulation/Gait Ambulation/Gait assistance: Min assist;Mod assist Gait Distance (Feet): 130 Feet Assistive device: Rolling walker (2 wheeled) Gait Pattern/deviations: Step-through pattern;Decreased stride length;Drifts right/left Gait velocity: decreased   General Gait Details: use of RW for stability; poor attention to L side; requires frequent cueing for safety with mobility; poor obstacle navigation   Stairs             Wheelchair Mobility     Modified Rankin (Stroke Patients Only) Modified Rankin (Stroke Patients Only) Pre-Morbid Rankin Score: No symptoms Modified Rankin: Moderately severe disability     Balance Overall balance assessment: Needs assistance Sitting-balance support: No upper extremity supported;Feet supported Sitting balance-[name]: Fair     Standing balance support: Bilateral upper extremity supported;During functional activity Standing balance-[name]: Poor                              Cognition Arousal/Alertness: Awake/alert Behavior During Therapy: Impulsive Overall Cognitive Status: Impaired/Different from baseline Area of Impairment: Following commands;Safety/judgement;Problem solving                       Following Commands: Follows one step commands inconsistently;Follows one step commands with increased time Safety/Judgement: Decreased awareness of safety;Decreased awareness of deficits   Problem Solving: Difficulty sequencing;Requires verbal cues;Requires tactile cues General Comments: L inattention; required cueing throughout session      Exercises      General Comments        Pertinent Vitals/Pain Pain Assessment: No/denies pain    Home Living                      Prior Function            PT Goals (current goals can now be found in the care plan section) Acute Rehab PT Goals Patient Stated Goal: none stated PT Goal Formulation: With patient Time For Goal Achievement: 03/26/19 Potential to Achieve Goals: Good Progress towards PT goals: Progressing  toward goals    Frequency    Min 3X/week      PT Plan Current plan remains appropriate    Co-evaluation              AM-PAC PT "6 Clicks" Mobility   Outcome Measure  Help needed turning from your back to your side while in a flat bed without using bedrails?: None Help needed moving from lying on your back to sitting on the side of a flat bed without using bedrails?: A  Little Help needed moving to and from a bed to a chair (including a wheelchair)?: A Little Help needed standing up from a chair using your arms (e.g., wheelchair or bedside chair)?: A Little Help needed to walk in hospital room?: A Lot Help needed climbing 3-5 steps with a railing? : Total 6 Click Score: 16    End of Session Equipment Utilized During Treatment: Gait belt Activity Tolerance: Patient tolerated treatment well Patient left: in chair;with call bell/phone within reach;with chair alarm set Nurse Communication: Mobility status PT Visit Diagnosis: Muscle weakness (generalized) (M62.81);Difficulty in walking, not elsewhere classified (R26.2);Hemiplegia and hemiparesis Hemiplegia - Right/Left: Left Hemiplegia - dominant/non-dominant: Dominant Hemiplegia - caused by: Cerebral infarction     Time: 3825-0539 PT Time Calculation (min) (ACUTE ONLY): 24 min  Charges:  $Gait Training: 8-22 mins $Therapeutic Activity: 8-22 mins                      Lanney Gins, PT, DPT Supplemental Physical Therapist 03/22/19 3:04 PM Pager: [phone] Office: [phone]

## 2019-03-22 NOTE — Progress Notes (Signed)
STROKE TEAM PROGRESS NOTE   Interval history:  Patient sitting in chair, calm, does not seem to be in distress. . He has no complaints.  He continues to have expressive language difficulties and left-sided visual field loss.  Vitals:   03/21/19 2348 03/22/19 0601 03/22/19 1022 03/22/19 1202  BP: (!) 148/91 119/77 135/85 125/70  Pulse: 97 74 87 60  Resp: 18 18 18 18   Temp: 97.9 F (36.6 C) 98.5 F (36.9 C) 98.3 F (36.8 C) 98.6 F (37 C)  TempSrc: Oral Oral Oral Oral  SpO2: 100% 99% 99% 100%  Weight:      Height:        CBC:  Recent Labs  Lab 03/17/19 0756 03/19/19 1520  WBC 7.3 8.4  HGB 13.8 14.1  HCT 40.8 42.0  MCV 91.7 90.7  PLT 204 154    Basic Metabolic Panel:  Recent Labs  Lab 03/17/19 0756 03/19/19 1520  NA 137 140  K 4.0 3.9  CL 101 106  CO2 25 25  GLUCOSE 162* 98  BUN 11 13  CREATININE 1.06 0.88  CALCIUM 9.1 9.8   Lipid Panel:     Component Value Date/Time   CHOL 159 03/11/2019 0946   TRIG 67 03/11/2019 0946   HDL 45 03/11/2019 0946   CHOLHDL 3.5 03/11/2019 0946   VLDL 13 03/11/2019 0946   LDLCALC 101 (H) 03/11/2019 0946   HgbA1c:  Lab Results  Component Value Date   HGBA1C 9.8 (H) 03/11/2019   Urine Drug Screen:     Component Value Date/Time   LABOPIA NONE DETECTED 11/13/2018 1217   COCAINSCRNUR NONE DETECTED 11/13/2018 1217   LABBENZ NONE DETECTED 11/13/2018 1217   AMPHETMU NONE DETECTED 11/13/2018 1217   THCU NONE DETECTED 11/13/2018 1217   LABBARB NONE DETECTED 11/13/2018 1217    Alcohol Level No results found for: [place] Code Stroke Wo Contrast 03/10/2019 1454 1. No acute finding by CT. Extensive chronic small-vessel ischemic changes throughout the brain as outlined above. Old right frontal infarction. 2. ASPECTS is 10.   Ct Angio Head W Or Wo Contrast Ct Angio Neck W Or Wo Contrast Ct Cerebral Perfusion W Contrast 03/10/2019 1517 1. Fetal type right PCA with proximal occlusion leading to 19 cc of penumbra  and no infarct by CT perfusion. There may be a right P1 segment that is also occluded.  2. 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  3. Subjectively advanced left cavernous ICA stenosis.  4. 30-40% atheromatous narrowing at the right ICA bulb.   CT angio 03/10/2019 S/P 4 vessel cerebral arteriogram followed by completev  Revascularization  Of RT PCOM and RT PCA with x 1 pass with 41mm x 60mm embotrap retriver device achieving a TICI 2b revascularization. 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 Motion degraded study. 2. Ischemic infarct of the right posterior cerebral artery territory, roughly corresponding to the ischemic volume identified on the earlier CT perfusion scan. 3. No hemorrhage or mass effect. 4. Chronic ischemic microangiopathy and age advanced atrophy.  PHYSICAL EXAM    Temp:  [97.9 F (36.6 C)-98.6 F (37 C)] 98.6 F (37 C) (07/18 1202) Pulse Rate:  [60-97] 60 (07/18 1202) Resp:  [16-18] 18 (07/18 1202) BP: (119-148)/(70-91) 125/70 (07/18 1202) SpO2:  [97 %-100 %] 100 % (07/18  1202)  General - Well nourished, well developed, middle-aged African male Ophthalmologic - fundi not visualized due to noncooperation.  Cardiovascular - Regular rate and rhythm.  Mental Status -  Awake alert, orientated to self, place, but not orientated to age, time. Language exam showed slightly nonfluent  speech intermittent word finding difficulty.  Cranial Nerves II - XII - II - left hemianopia. III, IV, VI - Extraocular movements intact, however, attending more on the right due to left hemianopia. V - Facial sensation intact bilaterally. VII - Facial movement intact bilaterally. VIII - Hearing & vestibular intact bilaterally. X - Palate elevates symmetrically. XI - Chin turning & shoulder shrug  intact bilaterally. XII - Tongue protrusion intact.  Motor Strength - [name] was normal in all extremities and pronator drift was absent.  Bulk was normal and fasciculations were absent.   Motor Tone - Muscle tone was assessed at the neck and appendages and was normal.  Reflexes - [name] were symmetrical in all extremities and he had no pathological reflexes.  Sensory - Light touch, temperature/pinprick were assessed and were symmetrical.    Coordination - slightly impaired on the left  gait and Station - not tested   ASSESSMENT/PLAN Joe Perry is a 68 y.o. 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 Old R frontal infarct.    CTA head & neck - R P1 occlusion. Small BA w/ high grade atheromatous narrowing. L ICA siphon stenosis.  CT perfusion 19 cc penumbra  Cerebral angio TICI 2b revascularization occluded R PCOM and P PCA. Underlying large vessel atherosclerosis   MRI  R PCA infarct    2D Echo EF 60 to 65%.   Recommend 30-day OP monitoring to rule out A. fib   HIV neg  LDL 101   HgbA1c 9.8  SCDs for VTE prophylaxis  aspirin 81 mg daily and clopidogrel 75 mg daily prior to admission, put on aspirin 325 mg daily and clopidogrel 75 mg daily. However, developed itchiness, plavix was discontinued. Now on ASA 325mg . Continue ASA 325 on discharge.  Therapy recommendations:  SNF  Disposition:  pending - SNF felt pt too high functioning. Pt may dc home as per CM  Hx stroke/TIA  11/2018 - presented for dizziness and unsteady, MRI showed right frontal and parietal small white matter infarcts. No LVO. Extensive SVD.  Put on DAPT.  Patient signed [facility]  01/2016 - admitted for gait disability.  MRI negative for acute infarct.  Old infarcts B BG,  thalami, L cerebellum.   Itchiness, likely due to side effect from plavix  Concerning for itchiness all over 03/20/19 as he is scratching his legs and genital area  Benadryl PRN  Give with Benadryl and Valium 1 dose 7/16  Discontinued Plavix and gabapentin  Much improved  Hypertension  Home meds:  None listed  Stable  On Lotensin 10 mg . Long-term BP goal normotensive  Hyperlipidemia  Home meds:  No statin listed  LDL 101, goal < 70  Started Lipitor 40  Continue statin on discharge  Diabetes type II, uncontrolled  Home meds:  glucophage 1000 bid, resumed 7/11  HgbA1c 9.8, goal < 7.0  Hyperglycemia improved  CBGs  SSI  Close PCP follow-up  Dysphagia . Secondary to stroke .  On D3 thin liquids . Speech on board . Gentle hydration, IV fluid at 40 cc  Other Stroke Risk Factors  Advanced age  Overweight, Body mass index is 29.3 kg/m., recommend weight loss, diet and exercise as appropriate   PLAN  Switch to ASA and Brilinta since pt developed itching with Plavix    Await SNF  Hospital day # 12 I have personally obtained history,examined this patient, reviewed notes, independently viewed imaging studies, participated in medical decision making and plan of care.ROS completed by me personally and pertinent positives fully documented  I have made any additions or clarifications directly to the above note.  Change Plavix to Brilinta.  Await disposition sent to nursing home for rehab when bed available.  Antony Contras, MD Medical Director [place] Pager: [phone] 03/22/2019 1:18 PM    To contact Stroke Continuity provider, please refer to [email or web address] After hours, contact General Neurology

## 2019-03-23 LAB — GLUCOSE, CAPILLARY
Glucose-Capillary: 83 mg/dL (ref 70–99)
Glucose-Capillary: 142 mg/dL — ABNORMAL HIGH (ref 70–99)
Glucose-Capillary: 107 mg/dL — ABNORMAL HIGH (ref 70–99)
Glucose-Capillary: 122 mg/dL — ABNORMAL HIGH (ref 70–99)

## 2019-03-23 MED ORDER — METOPROLOL TARTRATE 5 MG/5ML IV SOLN
5.0000 mg | Freq: Once | INTRAVENOUS | Status: AC
  Administered 2019-03-23: 01:00:00 5 mg via INTRAVENOUS
  Filled 2019-03-23: qty 5

## 2019-03-23 MED ORDER — LORAZEPAM 2 MG/ML IJ SOLN
1.0000 mg | Freq: Once | INTRAMUSCULAR | Status: AC
  Administered 2019-03-23: 1 mg via INTRAVENOUS
  Filled 2019-03-23: qty 1

## 2019-03-23 NOTE — Progress Notes (Signed)
STROKE TEAM PROGRESS NOTE   Interval history:  Patient sitting in chair, calm, does not seem to be in distress. . He has no complaints.  He continues to have expressive language difficulties and left-sided visual field loss.No changes  Vitals:   03/23/19 0249 03/23/19 0600 03/23/19 0951 03/23/19 1231  BP: 116/71 125/70 130/82 127/83  Pulse: 81 87 89 95  Resp: 16 16 16 16   Temp: 97.7 F (36.5 C) 97.8 F (36.6 C) 97.8 F (36.6 C) 98.3 F (36.8 C)  TempSrc:  Oral Oral Oral  SpO2: 97% 96% 99% 96%  Weight:      Height:        CBC:  Recent Labs  Lab 03/19/19 1520 03/22/19 1435  WBC 8.4 6.1  HGB 14.1 13.8  HCT 42.0 41.5  MCV 90.7 92.6  PLT 281 174    Basic Metabolic Panel:  Recent Labs  Lab 03/19/19 1520 03/22/19 1435  NA 140 138  K 3.9 3.9  CL 106 104  CO2 25 22  GLUCOSE 98 104*  BUN 13 14  CREATININE 0.88 0.99  CALCIUM 9.8 9.5   Lipid Panel:     Component Value Date/Time   CHOL 159 03/11/2019 0946   TRIG 67 03/11/2019 0946   HDL 45 03/11/2019 0946   CHOLHDL 3.5 03/11/2019 0946   VLDL 13 03/11/2019 0946   LDLCALC 101 (H) 03/11/2019 0946   HgbA1c:  Lab Results  Component Value Date   HGBA1C 9.8 (H) 03/11/2019   Urine Drug Screen:     Component Value Date/Time   LABOPIA NONE DETECTED 11/13/2018 1217   COCAINSCRNUR NONE DETECTED 11/13/2018 1217   LABBENZ NONE DETECTED 11/13/2018 1217   AMPHETMU NONE DETECTED 11/13/2018 1217   THCU NONE DETECTED 11/13/2018 1217   LABBARB NONE DETECTED 11/13/2018 1217    Alcohol Level No results found for: [place] Code Stroke Wo Contrast 03/10/2019 1454 1. No acute finding by CT. Extensive chronic small-vessel ischemic changes throughout the brain as outlined above. Old right frontal infarction.  2. ASPECTS is 10.   Ct Angio Head W Or Wo Contrast Ct Angio Neck W Or Wo Contrast Ct Cerebral Perfusion W Contrast 03/10/2019 1517 1. Fetal type right PCA with proximal occlusion leading to 19 cc of  penumbra and no infarct by CT perfusion. There may be a right P1 segment that is also occluded.  2. 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  3. Subjectively advanced left cavernous ICA stenosis.  4. 30-40% atheromatous narrowing at the right ICA bulb.   CT angio 03/10/2019 S/P 4 vessel cerebral arteriogram followed by complete revascularization of RT PCOM and RT PCA with x 1 pass with 24mm x 76mm embotrap retriver device achieving a TICI 2b revascularization. 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 Motion degraded study. 2. Ischemic infarct of the right posterior cerebral artery territory, roughly corresponding to the ischemic volume identified on the earlier CT perfusion scan. 3. No hemorrhage or mass effect. 4. Chronic ischemic microangiopathy and age advanced atrophy.  PHYSICAL EXAM    Temp:  [97.7 F (36.5 C)-98.3 F (36.8 C)] 98.3 F (36.8 C) (07/19 1231) Pulse Rate:  [76-107] 95 (07/19 1231) Resp:  [16-20] 16 (07/19 1231) BP: (116-183)/(69-122) 127/83 (07/19 1231) SpO2:  [96 %-99 %] 96 % (07/19 1231)  General - Well nourished, well developed, middle-aged African male Ophthalmologic - fundi not visualized due to noncooperation.  Cardiovascular - Regular rate and rhythm.  Mental Status -  Awake alert, orientated to self, place, but not orientated to age, time. Language exam showed slightly nonfluent  speech intermittent word finding difficulty.  Cranial Nerves II - XII - II - left hemianopia. III, IV, VI - Extraocular movements intact, however, attending more on the right due to left hemianopia. V - Facial sensation intact bilaterally. VII - Facial movement intact bilaterally. VIII - Hearing & vestibular intact bilaterally. X - Palate elevates symmetrically. XI - Chin turning & shoulder  shrug intact bilaterally. XII - Tongue protrusion intact.  Motor Strength - [name] was normal in all extremities and pronator drift was absent.  Bulk was normal and fasciculations were absent.   Motor Tone - Muscle tone was assessed at the neck and appendages and was normal.  Reflexes - [name] were symmetrical in all extremities and he had no pathological reflexes.  Sensory - Light touch, temperature/pinprick were assessed and were symmetrical.    Coordination - slightly impaired on the left  gait and Station - not tested   ASSESSMENT/PLAN [name] is a 68 y.o. 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 Old R frontal infarct.    CTA head & neck - R P1 occlusion. Small BA w/ high grade atheromatous narrowing. L ICA siphon stenosis.  CT perfusion 19 cc penumbra  Cerebral angio TICI 2b revascularization occluded R PCOM and P PCA. Underlying large vessel atherosclerosis   MRI - Rt PCA infarct    2D Echo EF 60 to 65%.   Recommend 30-day OP monitoring to rule out A. fib   HIV neg  LDL 101   HgbA1c 9.8  SCDs for VTE prophylaxis  aspirin 81 mg daily and clopidogrel 75 mg daily prior to admission, put on aspirin 325 mg daily and clopidogrel 75 mg daily. However, developed itchiness, plavix was discontinued. Now on ASA 325mg . Continue ASA 325 on discharge.  Therapy recommendations:  SNF  Disposition:  pending - SNF felt pt too high functioning. Pt may dc home as per CM  Hx stroke/TIA  11/2018 - presented for dizziness and unsteady, MRI showed right frontal and parietal small white matter infarcts. No LVO. Extensive SVD.  Put on DAPT.  Patient signed [facility]  01/2016 - admitted for gait disability.  MRI negative for acute infarct.  Old infarcts B  BG, thalami, L cerebellum.   Itchiness, likely due to side effect from plavix  Concerning for itchiness all over 03/20/19 as he is scratching his legs and genital area  Benadryl PRN  Give with Benadryl and Valium 1 dose 7/16  Discontinued Plavix and gabapentin  Much improved  Hypertension  Home meds:  None listed  Stable  On Lotensin 10 mg . Long-term BP goal normotensive  Hyperlipidemia  Home meds:  No statin listed  LDL 101, goal < 70  Started Lipitor 40  Continue statin on discharge  Diabetes type II, uncontrolled  Home meds:  glucophage 1000 bid, resumed 7/11  HgbA1c 9.8, goal < 7.0  Hyperglycemia improved  CBGs  SSI  Close PCP follow-up  Dysphagia . Secondary to stroke . On D3  thin liquids . Speech on board . Gentle hydration, IV fluid at 40 cc  Other Stroke Risk Factors  Advanced age  Overweight, Body mass index is 29.3 kg/m., recommend weight loss, diet and exercise as appropriate   PLAN  Switch to ASA and Brilinta since pt developed itching with Plavix    Await SNF  Nursing reports pt restless at night - sleep med requested (Dr Aura Dials ordered one time dose ativan early this AM)  Pt's brother [name] called for update [phone] and answered questions.  Hospital day # 13 I have personally obtained history,examined this patient, reviewed notes, independently viewed imaging studies, participated in medical decision making and plan of care.ROS completed by me personally and pertinent positives fully documented  I have made any additions or clarifications directly to the above note.  Change Plavix to Brilinta. Continue benadryl and vistaril prn for itching. Await disposition sent to nursing home for rehab when bed available.  Antony Contras, MD Medical Director [facility] Pager: [phone] 03/23/2019 12:52 PM    To contact Stroke Continuity provider, please refer to [email or web address] After hours, contact General  Neurology

## 2019-03-23 NOTE — Progress Notes (Signed)
Pt itching and scratching himself all over; irritable and agitated; keeps on trying to get out of bed; MD notified and new order received; pt BP elevated but not time for prn; MD paged and notified; no new orders yet. Will continue to closely monitor pt. Delia Heady RN

## 2019-03-23 NOTE — Progress Notes (Signed)
MD returned call back to RN and new orders received for pt. Will continue to closely monitor. Delia Heady RN

## 2019-03-23 NOTE — Progress Notes (Signed)
Pt c/o itching and noted to be scratching himself and irritable; prn benadryl and atarax given but ineffective; pt keeps on trying to get out of bed; MD notified and new order received; will continue to closely monitor. Delia Heady RN

## 2019-03-23 NOTE — Progress Notes (Signed)
Physical Therapy Treatment Patient Details Name: Joe Perry MRN: [id number] DOB: Oct 08, 1950 Today's Date: 03/23/2019    History of Present Illness 68 yo male s/p  R PCOM and R PCA occlusion s/p emergent thrombectomy on 7/6. Pt admitteed with L sided weakness, R gaze perference, L homonymous hemianopsia. 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    PT Comments    Patient progressing with mobility, though remains limited due to cognition and decreased L side awareness with visual changes as well.  Session focused on assisting nursing to clean up from [name] that he spread over his bed, call bell, condom cath etc.  Calm once back to bed after ambulation.  Remains appropriate for SNF level rehab.  PT to follow acutely.    Follow Up Recommendations  SNF;Supervision/Assistance - 24 hour     Equipment Recommendations  None recommended by PT    Recommendations for Other Services       Precautions / Restrictions Precautions Precautions: Fall Precaution Comments: L hemianopsia; L neglect    Mobility  Bed Mobility Overal bed mobility: Needs Assistance Bed Mobility: Rolling;Supine to Sit;Sit to Supine Rolling: Min assist   Supine to sit: Min assist Sit to supine: Supervision   General bed mobility comments: rolling in bed for hygiene and linen change  Transfers Overall transfer level: Needs assistance Equipment used: Rolling walker (2 wheeled);1 person hand held assist Transfers: Sit to/from Stand Sit to Stand: Min assist         General transfer comment: min A for balance, assist to place L UE on proper grip on walker, used HHA from [facility] over toilet in bathroom  Ambulation/Gait Ambulation/Gait assistance: Min assist;Mod assist Gait Distance (Feet): 130 Feet Assistive device: Rolling walker (2 wheeled) Gait Pattern/deviations: Step-to pattern;Step-through pattern;Decreased step length - left;Decreased stance time - left;Wide base of support     General Gait Details: some  evidence of L knee hyperextension in stance, leading with R and keeping L hemipelvis retracted; assist to manage walker safety and standing on L side to keep from running into door facing, etc; in bathroom used HHA due to too small for RW   Stairs             Wheelchair Mobility    Modified Rankin (Stroke Patients Only) Modified Rankin (Stroke Patients Only) Pre-Morbid Rankin Score: No symptoms Modified Rankin: Moderately severe disability     Balance Overall balance assessment: Needs assistance Sitting-balance support: Feet supported Sitting balance-[name]: Fair Sitting balance - Comments: needs cues for balance     Standing balance-[name]: Poor Standing balance comment: walker or HHA for balance                            Cognition Arousal/Alertness: Awake/alert Behavior During Therapy: Impulsive Overall Cognitive Status: Impaired/Different from baseline Area of Impairment: Following commands;Safety/judgement;Problem solving                   Current Attention Level: Sustained Memory: Decreased recall of precautions;Decreased short-term memory Following Commands: Follows one step commands inconsistently;Follows one step commands with increased time Safety/Judgement: Decreased awareness of safety;Decreased awareness of deficits     General Comments: patient laying in feces and had his hands in it and smeared on bedrails, condom cath off      Exercises      General Comments General comments (skin integrity, edema, etc.): assisted nursing to change linens and help clean pt/environment, apply new condom  cath; pt felt needed another BM so sat on BSC over commode in bathroom; assist with hygiene after      Pertinent Vitals/Pain Pain Assessment: No/denies pain    Home Living                      Prior Function            PT Goals (current goals can now be found in the care plan section) Progress towards PT goals:  Progressing toward goals    Frequency    Min 3X/week      PT Plan Current plan remains appropriate    Co-evaluation              AM-PAC PT "6 Clicks" Mobility   Outcome Measure  Help needed turning from your back to your side while in a flat bed without using bedrails?: None Help needed moving from lying on your back to sitting on the side of a flat bed without using bedrails?: A Little Help needed moving to and from a bed to a chair (including a wheelchair)?: A Little Help needed standing up from a chair using your arms (e.g., wheelchair or bedside chair)?: A Little Help needed to walk in hospital room?: A Lot Help needed climbing 3-5 steps with a railing? : Total 6 Click Score: 16    End of Session Equipment Utilized During Treatment: Gait belt Activity Tolerance: Patient tolerated treatment well Patient left: in bed Nurse Communication: Mobility status(pt in bed if needs new hand mitts) PT Visit Diagnosis: Muscle weakness (generalized) (M62.81);Hemiplegia and hemiparesis;Other abnormalities of gait and mobility (R26.89) Hemiplegia - Right/Left: Left Hemiplegia - dominant/non-dominant: Dominant Hemiplegia - caused by: Cerebral infarction     Time: 1530-1607 PT Time Calculation (min) (ACUTE ONLY): 37 min  Charges:  $Gait Training: 8-22 mins $Therapeutic Activity: 8-22 mins                     Magda Kiel, [place] Acute Rehabilitation Services [phone] 03/23/2019    Reginia Naas 03/23/2019, 5:49 PM

## 2019-03-24 LAB — GLUCOSE, CAPILLARY
Glucose-Capillary: 106 mg/dL — ABNORMAL HIGH (ref 70–99)
Glucose-Capillary: 111 mg/dL — ABNORMAL HIGH (ref 70–99)
Glucose-Capillary: 111 mg/dL — ABNORMAL HIGH (ref 70–99)
Glucose-Capillary: 127 mg/dL — ABNORMAL HIGH (ref 70–99)

## 2019-03-24 MED ORDER — LORAZEPAM 2 MG/ML IJ SOLN
1.0000 mg | Freq: Once | INTRAMUSCULAR | Status: AC
  Administered 2019-03-24: 1 mg via INTRAVENOUS

## 2019-03-24 MED ORDER — LORAZEPAM 2 MG/ML IJ SOLN
INTRAMUSCULAR | Status: AC
  Filled 2019-03-24: qty 1

## 2019-03-24 NOTE — Progress Notes (Signed)
Occupational Therapy Treatment Patient Details Name: Joe Perry MRN: [id number] DOB: 1951-05-26 Today's Date: 03/24/2019    History of present illness 68 yo male s/p  R PCOM and R PCA occlusion s/p emergent thrombectomy on 7/6. Pt admitteed with L sided weakness, R gaze perference, L homonymous hemianopsia. 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   OT comments  Pt with more difficulty in standing and sitting balance this session compared to last. Pt required mod A to prevent strong posterior bias in sitting and standing. Pt demonstrating poor proprioceptive awareness in his L hand, but does have enough [place] to manipulate small objects during ADLs with moderate cueing. SNF remains appropriate d/c.    Follow Up Recommendations  SNF;Supervision/Assistance - 24 hour    Equipment Recommendations  Other (comment)(defer to next venue)       Precautions / Restrictions Precautions Precautions: Fall Precaution Comments: L hemianopsia; L neglect       Mobility Bed Mobility Overal bed mobility: Needs Assistance Bed Mobility: Supine to Sit     Supine to sit: Mod assist     General bed mobility comments: Pt with increased posterior lean, requiring heavy cueing and mod A to elevate trunk from supine > sit.   Transfers Overall transfer level: Needs assistance Equipment used: Rolling walker (2 wheeled);1 person hand held assist Transfers: Sit to/from [facility] Sit to Stand: Mod assist Stand pivot transfers: Mod assist       General transfer comment: mod A for balance support and cueing/facilitation to place LUE on RW.    Balance Overall balance assessment: Needs assistance Sitting-balance support: Feet supported Sitting balance-[name]: Poor Sitting balance - Comments: Pt frequently losing balance posteriorly Postural control: Posterior lean Standing balance support: Bilateral upper extremity supported;During functional activity Standing balance-Leahy  Scale: Poor Standing balance comment: mod A for posterior lean correction this session                           ADL either performed or assessed with clinical judgement   ADL Overall ADL's : Needs assistance/impaired     Grooming: Wash/dry hands;Wash/dry face;Oral care;Sitting Grooming Details (indicate cue type and reason): Pt required assist to position toothpaste in L hand and lacked the proprioceptive awareness to successfully apply toothpaste. Pt able to bring toothbrush to mouth with no spillage and follow all further commands                             Functional mobility during ADLs: Moderate assistance;Rolling walker       Vision   Vision Assessment?: Vision impaired- to be further tested in functional context          Cognition Arousal/Alertness: Awake/alert Behavior During Therapy: Impulsive Overall Cognitive Status: Impaired/Different from baseline Area of Impairment: Following commands;Safety/judgement;Problem solving                 Orientation Level: Time;Situation Current Attention Level: Sustained Memory: Decreased recall of precautions;Decreased short-term memory Following Commands: Follows one step commands inconsistently;Follows one step commands with increased time Safety/Judgement: Decreased awareness of safety;Decreased awareness of deficits Awareness: Intellectual Problem Solving: Difficulty sequencing;Requires verbal cues;Requires tactile cues General Comments: Pt requiring more cueing this session for arousal and following commands         Exercises     Shoulder Instructions       General Comments Pt with worse posterior lean/balance overall this  session    Pertinent Vitals/ Pain       Pain Assessment: No/denies pain Faces Pain Scale: No hurt Pain Intervention(s): Monitored during session         Frequency  Min 2X/week        Progress Toward Goals  OT Goals(current goals can now be found in the  care plan section)  Progress towards OT goals: Progressing toward goals  Acute Rehab OT Goals Patient Stated Goal: none stated OT Goal Formulation: With patient Time For Goal Achievement: 03/31/19 Potential to Achieve Goals: Good  Plan Discharge plan remains appropriate       AM-PAC OT "6 Clicks" Daily Activity     Outcome Measure   Help from another person eating meals?: A Little Help from another person taking care of personal grooming?: A Little Help from another person toileting, which includes using toliet, bedpan, or urinal?: A Lot Help from another person bathing (including washing, rinsing, drying)?: A Little Help from another person to put on and taking off regular upper body clothing?: A Little Help from another person to put on and taking off regular lower body clothing?: A Lot 6 Click Score: 16    End of Session Equipment Utilized During Treatment: Rolling walker;Gait belt  OT Visit Diagnosis: Unsteadiness on feet (R26.81);Muscle weakness (generalized) (M62.81)   Activity Tolerance Patient tolerated treatment well   Patient Left in chair;with call bell/phone within reach;with chair alarm set   Nurse Communication Mobility status        Time: 0768-0881 OT Time Calculation (min): 19 min  Charges: OT General Charges $OT Visit: 1 Visit OT Treatments $Self Care/Home Management : 8-22 mins   Curtis Sites OTR/L  03/24/2019, 10:30 AM

## 2019-03-24 NOTE — Progress Notes (Signed)
STROKE TEAM PROGRESS NOTE   Interval history:  Patient is neurologically unchanged.  No significant changes noted.  Vital signs remained stable.  Vitals:   03/24/19 0004 03/24/19 0348 03/24/19 0850 03/24/19 1113  BP: 129/88 (!) 153/97 (!) 134/91 (!) 150/83  Pulse: 82 82 79 85  Resp: 14 14 18 16   Temp: 98 F (36.7 C) 97.9 F (36.6 C) 98.1 F (36.7 C) 97.6 F (36.4 C)  TempSrc: Oral Oral Axillary Axillary  SpO2: 98% 100% 100% 100%  Weight:      Height:        CBC:  Recent Labs  Lab 03/19/19 1520 03/22/19 1435  WBC 8.4 6.1  HGB 14.1 13.8  HCT 42.0 41.5  MCV 90.7 92.6  PLT 281 094    Basic Metabolic Panel:  Recent Labs  Lab 03/19/19 1520 03/22/19 1435  NA 140 138  K 3.9 3.9  CL 106 104  CO2 25 22  GLUCOSE 98 104*  BUN 13 14  CREATININE 0.88 0.99  CALCIUM 9.8 9.5    PHYSICAL EXAM    General - Well nourished, well developed, middle-aged African male Ophthalmologic - fundi not visualized due to noncooperation.  Cardiovascular - Regular rate and rhythm.  Mental Status -  Awake alert, orientated to self, place, but not orientated to age, time. Language exam showed slightly nonfluent  speech intermittent word finding difficulty.  Cranial Nerves II - XII - II - left hemianopia. III, IV, VI - Extraocular movements intact, however, attending more on the right due to left hemianopia. V - Facial sensation intact bilaterally. VII - Facial movement intact bilaterally. VIII - Hearing & vestibular intact bilaterally. X - Palate elevates symmetrically. XI - Chin turning & shoulder shrug intact bilaterally. XII - Tongue protrusion intact.  Motor Strength - [name] was normal in all extremities and pronator drift was absent.  Bulk was normal and fasciculations were absent.   Motor Tone - Muscle tone was assessed at the neck and appendages and was normal.  Reflexes - [name] were symmetrical in all extremities and he had no pathological  reflexes.  Sensory - Light touch, temperature/pinprick were assessed and were symmetrical.    Coordination - slightly impaired on the left  gait and Station - not tested   ASSESSMENT/PLAN [name] is a 68 y.o. 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 Old R frontal infarct.    CTA head & neck - R P1 occlusion. Small BA w/ high grade atheromatous narrowing. L ICA siphon stenosis.  CT perfusion 19 cc penumbra  Cerebral angio TICI 2b revascularization occluded R PCOM and P PCA. Underlying large vessel atherosclerosis   MRI - Rt PCA infarct    2D Echo EF 60 to 65%.   Recommend 30-day OP monitoring to rule out A. fib   HIV neg  LDL 101   HgbA1c 9.8  SCDs for VTE prophylaxis  aspirin 81 mg daily and clopidogrel 75 mg daily prior to admission, put on aspirin 325 mg daily and clopidogrel 75 mg daily. However, developed itchiness, plavix was discontinued. Now on ASA 81 and Brilinta 90 bid. Continue on discharge.  Therapy recommendations:  SNF  Disposition:  Pending   Social worker following  Hx stroke/TIA  11/2018 -  presented for dizziness and unsteady, MRI showed right frontal and parietal small white matter infarcts. No LVO. Extensive SVD.  Put on DAPT.  Patient signed [facility]  01/2016 - admitted for gait disability.  MRI negative for acute infarct.  Old infarcts B BG, thalami, L cerebellum.   Itchiness, likely due to side effect from plavix Drowsy/Sleepy, medication effect  Concerning for itchiness all over 03/20/19 as he is scratching his legs and genital area  Benadryl PRN  Give with Benadryl and Valium 1 dose 7/16  Discontinued Plavix and gabapentin  Now on benadryl and vistaril sleepy. Will d/c vistaril  Hypertension  Home meds:  None  listed  Stable  On Lotensin 10 mg . Long-term BP goal normotensive  Hyperlipidemia  Home meds:  No statin listed  LDL 101, goal < 70  Started Lipitor 40  Continue statin on discharge  Diabetes type II, uncontrolled  Home meds:  glucophage 1000 bid, resumed 7/11  HgbA1c 9.8, goal < 7.0  Hyperglycemia improved  CBGs  SSI  Close PCP follow-up  Dysphagia . Secondary to stroke . On D3 thin liquids . Speech on board . Gentle hydration, IV fluid at 40 cc  Other Stroke Risk Factors  Advanced age  Overweight, Body mass index is 29.3 kg/m., recommend weight loss, diet and exercise as appropriate   D/c telemetry while we wait on placement  Hospital day # 14  Discussed with case manager.  Still awaiting nursing home which is going to take long as patient has not yet finished Medicaid application.  Patient's brother was supposed to come over the weekend and talk to him and get some financial information which has not yet happened.  Antony Contras, MD Medical Director [facility] Pager: [phone] 03/24/2019 3:38 PM    To contact Stroke Continuity provider, please refer to [email or web address] After hours, contact General Neurology

## 2019-03-24 NOTE — Progress Notes (Signed)
  Speech Language Pathology Treatment: Dysphagia;Cognitive-Linquistic  Patient Details Name: Joe Perry MRN: [id number] DOB: [birth year] Today's Date: 03/24/2019 Time: 4098-1191 SLP Time Calculation (min) (ACUTE ONLY): 22 min  Assessment / Plan / Recommendation Clinical Impression  Prolonged oral prep, transit with mild left oral pocketing. Received Ativan last night and was drowsy this am. Max assist for meal set up and feeding (pt held fork to mouth). He did not cough, have wet voice or clear throat with 2 bites pancake, sausage and straw presentation juice. Meal cut short due to falling asleep. Pt needed choice of two to identify place- he stated hospital but also repeated other option. Speech intelligibility reduced impacted by low intensity, reduced articulation in addition to foreign accent. Basic problem solving using call bell required total assist. Continue efforts.    HPI HPI: 68 year old male admitted 03/10/2019 with left weakness, unsteady gait, right gaze, left hemianopsia. PMH: CVA, HTN, DM. CT negative. MRI Ischemic infarct of the right posterior cerebral artery, territory, roughly corresponding to the ischemic volume.      SLP Plan  Continue with current plan of care       Recommendations  Diet recommendations: Dysphagia 3 (mechanical soft);Thin liquid Liquids provided via: Cup;Straw Medication Administration: Crushed with puree Supervision: Staff to assist with self feeding;Full supervision/cueing for compensatory strategies Compensations: Slow rate;Small sips/bites;Minimize environmental distractions;Lingual sweep for clearance of pocketing;Follow solids with liquid Postural Changes and/or Swallow Maneuvers: Seated upright 90 degrees;Upright 30-60 min after meal                Oral Care Recommendations: Oral care BID Follow up Recommendations: Skilled Nursing facility SLP Visit Diagnosis: Cognitive communication deficit (R41.841);Aphasia (R47.01);Dysphagia,  unspecified (R13.10) Plan: Continue with current plan of care       GO                Houston Siren 03/24/2019, 8:44 AM  Orbie Pyo Colvin Caroli.Ed Risk analyst [phone] Office [phone]

## 2019-03-25 LAB — GLUCOSE, CAPILLARY
Glucose-Capillary: 130 mg/dL — ABNORMAL HIGH (ref 70–99)
Glucose-Capillary: 135 mg/dL — ABNORMAL HIGH (ref 70–99)
Glucose-Capillary: 140 mg/dL — ABNORMAL HIGH (ref 70–99)
Glucose-Capillary: 148 mg/dL — ABNORMAL HIGH (ref 70–99)

## 2019-03-25 MED ORDER — QUETIAPINE FUMARATE 25 MG PO TABS
25.0000 mg | ORAL_TABLET | Freq: Once | ORAL | Status: AC
  Administered 2019-03-25: 25 mg via ORAL
  Filled 2019-03-25: qty 1

## 2019-03-25 NOTE — Progress Notes (Signed)
Physical Therapy Treatment Patient Details Name: Joe Perry MRN: [id number] DOB: 05/12/1951 Today's Date: 03/25/2019    History of Present Illness 68 yo male s/p  R PCOM and R PCA occlusion s/p emergent thrombectomy on 7/6. Pt admitteed with L sided weakness, R gaze perference, L homonymous hemianopsia. 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    PT Comments    Patient seen for mobility progression. Continue to progress as tolerated with anticipated d/c to SNF for further skilled PT services.     Follow Up Recommendations  SNF;Supervision/Assistance - 24 hour     Equipment Recommendations  None recommended by PT    Recommendations for Other Services       Precautions / Restrictions Precautions Precautions: Fall Precaution Comments: L hemianopsia; L inattention Restrictions Weight Bearing Restrictions: No    Mobility  Bed Mobility Overal bed mobility: Needs Assistance Bed Mobility: Supine to Sit     Supine to sit: Min assist     General bed mobility comments: increased time and effort required; assist to elevate trunk into sitting   Transfers Overall transfer level: Needs assistance Equipment used: 2 person hand held assist Transfers: Sit to/from Stand Sit to Stand: Min assist;+2 physical assistance         General transfer comment: assist to power up into standing and to steady once in standing   Ambulation/Gait Ambulation/Gait assistance: Mod assist;+2 physical assistance Gait Distance (Feet): 120 Feet Assistive device: 2 person hand held assist Gait Pattern/deviations: Step-through pattern;Wide base of support;Decreased stride length;Decreased stance time - left;Decreased step length - right;Decreased dorsiflexion - left;Trunk flexed Gait velocity: decreased   General Gait Details: cues for upright posture, increased bilat step lengths, and attending to L side    Stairs             Wheelchair Mobility    Modified Rankin (Stroke Patients  Only) Modified Rankin (Stroke Patients Only) Pre-Morbid Rankin Score: No symptoms Modified Rankin: Moderately severe disability     Balance Overall balance assessment: Needs assistance Sitting-balance support: Feet supported;Single extremity supported Sitting balance-[name]: Fair   Postural control: Posterior lean Standing balance support: Bilateral upper extremity supported;During functional activity Standing balance-[name]: Poor                              Cognition Arousal/Alertness: Awake/alert Behavior During Therapy: Impulsive;Flat affect Overall Cognitive Status: Impaired/Different from baseline Area of Impairment: Following commands;Safety/judgement;Problem solving;Orientation                 Orientation Level: Time;Situation;Place Current Attention Level: Sustained   Following Commands: Follows one step commands with increased time;Follows one step commands consistently Safety/Judgement: Decreased awareness of safety;Decreased awareness of deficits   Problem Solving: Requires verbal cues;Requires tactile cues        Exercises      General Comments        Pertinent Vitals/Pain Pain Assessment: No/denies pain    Home Living                      Prior Function            PT Goals (current goals can now be found in the care plan section) Progress towards PT goals: Progressing toward goals    Frequency    Min 3X/week      PT Plan Current plan remains appropriate    Co-evaluation  AM-PAC PT "6 Clicks" Mobility   Outcome Measure  Help needed turning from your back to your side while in a flat bed without using bedrails?: A Little Help needed moving from lying on your back to sitting on the side of a flat bed without using bedrails?: A Little Help needed moving to and from a bed to a chair (including a wheelchair)?: A Little Help needed standing up from a chair using your arms (e.g.,  wheelchair or bedside chair)?: A Little Help needed to walk in hospital room?: A Lot Help needed climbing 3-5 steps with a railing? : Total 6 Click Score: 15    End of Session Equipment Utilized During Treatment: Gait belt Activity Tolerance: Patient tolerated treatment well Patient left: in bed;with call bell/phone within reach;with bed alarm set;with restraints reapplied Nurse Communication: Mobility status PT Visit Diagnosis: Muscle weakness (generalized) (M62.81);Hemiplegia and hemiparesis;Other abnormalities of gait and mobility (R26.89) Hemiplegia - Right/Left: Left Hemiplegia - dominant/non-dominant: Dominant Hemiplegia - caused by: Cerebral infarction     Time: 6712-4580 PT Time Calculation (min) (ACUTE ONLY): 21 min  Charges:  $Gait Training: 8-22 mins                     Joe Perry, PTA Acute Rehabilitation Services Pager: [phone] Office: [phone]     Darliss Cheney 03/25/2019, 10:56 AM

## 2019-03-25 NOTE — Progress Notes (Signed)
STROKE TEAM PROGRESS NOTE   Interval history:  Patient remains neurologically unchanged.  Patient's brother has not yet arrived to meet social worker and discusses financial matters to help with Medicaid application.  Vitals:   03/25/19 0427 03/25/19 0805 03/25/19 1214 03/25/19 1258  BP: 108/73 124/80 119/76   Pulse: 91 78 83   Resp: 17 16 16    Temp: 98.1 F (36.7 C) (!) 97.3 F (36.3 C) (!) 96.8 F (36 C) 97.6 F (36.4 C)  TempSrc: Oral Oral Axillary Axillary  SpO2: 100% 100% 97%   Weight:      Height:        CBC:  Recent Labs  Lab 03/19/19 1520 03/22/19 1435  WBC 8.4 6.1  HGB 14.1 13.8  HCT 42.0 41.5  MCV 90.7 92.6  PLT 281 480    Basic Metabolic Panel:  Recent Labs  Lab 03/19/19 1520 03/22/19 1435  NA 140 138  K 3.9 3.9  CL 106 104  CO2 25 22  GLUCOSE 98 104*  BUN 13 14  CREATININE 0.88 0.99  CALCIUM 9.8 9.5    PHYSICAL EXAM     General - Well nourished, well developed, middle-aged African male Ophthalmologic - fundi not visualized due to noncooperation.  Cardiovascular - Regular rate and rhythm.  Mental Status -  Awake alert, orientated to self, place, but not orientated to age, time. Language exam showed slightly nonfluent  speech intermittent word finding difficulty.  Cranial Nerves II - XII - II - left hemianopia. III, IV, VI - Extraocular movements intact, however, attending more on the right due to left hemianopia. V - Facial sensation intact bilaterally. VII - Facial movement intact bilaterally. VIII - Hearing & vestibular intact bilaterally. X - Palate elevates symmetrically. XI - Chin turning & shoulder shrug intact bilaterally. XII - Tongue protrusion intact.  Motor Strength - [name] was normal in all extremities and pronator drift was absent.  Bulk was normal and fasciculations were absent.   Motor Tone - Muscle tone was assessed at the neck and appendages and was normal.  Reflexes - [name] were  symmetrical in all extremities and he had no pathological reflexes.  Sensory - Light touch, temperature/pinprick were assessed and were symmetrical.    Coordination - slightly impaired on the left  gait and Station - not tested   ASSESSMENT/PLAN [name] is a 68 y.o. 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 Old R frontal infarct.    CTA head & neck - R P1 occlusion. Small BA w/ high grade atheromatous narrowing. L ICA siphon stenosis.  CT perfusion 19 cc penumbra  Cerebral angio TICI 2b revascularization occluded R PCOM and P PCA. Underlying large vessel atherosclerosis   MRI Rt PCA infarct    2D Echo EF 60 to 65%.   Recommend 30-day OP monitoring to rule out A. fib   HIV neg  LDL 101   HgbA1c 9.8  SCDs for VTE prophylaxis  aspirin 81 mg daily and clopidogrel 75 mg daily prior to admission, put on aspirin 325 mg daily and clopidogrel 75 mg daily. However, developed itchiness, plavix was discontinued. Now on ASA 81 and Brilinta 90 bid. Continue on discharge.  Therapy recommendations:  SNF  Disposition:  Pending  Patient remains stable without change  Education officer, museum following for MCD application   Hx stroke/TIA  11/2018 - presented for dizziness and unsteady, MRI showed right frontal and parietal small white matter infarcts. No LVO. Extensive SVD.  Put on DAPT.  Patient signed [facility]  01/2016 - admitted for gait disability.  MRI negative for acute infarct.  Old infarcts B BG, thalami, L cerebellum.   Itchiness, likely due to side effect from plavix Drowsy/Sleepy, medication effect  Concerning for itchiness all over 03/20/19 as he is scratching his legs and genital area  Benadryl PRN  Give with Benadryl and Valium 1 dose 7/16  Discontinued Plavix and  gabapentin  Now on benadryl and vistaril sleepy. Will d/c vistaril  Hypertension  Home meds:  None listed  Stable  On Lotensin 10 mg . Long-term BP goal normotensive  Hyperlipidemia  Home meds:  No statin listed  LDL 101, goal < 70  On Lipitor 40  Continue statin on discharge  Diabetes type II, uncontrolled  Home meds:  glucophage 1000 bid, resumed 7/11  HgbA1c 9.8, goal < 7.0  Hyperglycemia improved  CBGs  SSI  Close PCP follow-up  Dysphagia . Secondary to stroke . On D3 thin liquids . Speech on board . Gentle hydration, IV fluid at 40 cc  Other Stroke Risk Factors  Advanced age  Overweight, Body mass index is 29.3 kg/m., recommend weight loss, diet and exercise as appropriate   D/c telemetry while we wait on placement  Hospital day # 15 Patient medically stable for transfer to skilled nursing facility unfortunately this is going to take some time as patient still has Medicaid application to be filled out as per case manager and patient's family has to take initiative to help him do so.  Antony Contras, MD Medical Director [facility] Pager: [phone] 03/25/2019 2:45 PM    To contact Stroke Continuity provider, please refer to [email or web address] After hours, contact General Neurology

## 2019-03-25 NOTE — Progress Notes (Signed)
Benedryl given to pt at 1548 for generalozed itching.  MD Aroor notified about pt still complaining of itching now in the groin area. Nurse will continue to monitor.

## 2019-03-26 LAB — CBC
HCT: 39.2 % (ref 39.0–52.0)
Hemoglobin: 13.3 g/dL (ref 13.0–17.0)
MCH: 30.9 pg (ref 26.0–34.0)
MCHC: 33.9 g/dL (ref 30.0–36.0)
MCV: 91 fL (ref 80.0–100.0)
Platelets: 306 10*3/uL (ref 150–400)
RBC: 4.31 MIL/uL (ref 4.22–5.81)
RDW: 12.3 % (ref 11.5–15.5)
WBC: 6.7 10*3/uL (ref 4.0–10.5)
nRBC: 0 % (ref 0.0–0.2)

## 2019-03-26 LAB — GLUCOSE, CAPILLARY
Glucose-Capillary: 111 mg/dL — ABNORMAL HIGH (ref 70–99)
Glucose-Capillary: 161 mg/dL — ABNORMAL HIGH (ref 70–99)
Glucose-Capillary: 96 mg/dL (ref 70–99)
Glucose-Capillary: 119 mg/dL — ABNORMAL HIGH (ref 70–99)

## 2019-03-26 LAB — BASIC METABOLIC PANEL
Anion gap: 9 (ref 5–15)
BUN: 11 mg/dL (ref 8–23)
CO2: 25 mmol/L (ref 22–32)
Calcium: 9.9 mg/dL (ref 8.9–10.3)
Chloride: 106 mmol/L (ref 98–111)
Creatinine, Ser: 0.86 mg/dL (ref 0.61–1.24)
GFR calc Af Amer: >60 mL/min (ref >60)
GFR calc non Af Amer: >60 mL/min (ref >60)
Glucose, Bld: 100 mg/dL — ABNORMAL HIGH (ref 70–99)
Potassium: 4.2 mmol/L (ref 3.5–5.1)
Sodium: 140 mmol/L (ref 135–145)

## 2019-03-26 NOTE — TOC Progression Note (Signed)
Transition of Care [facility]) - Progression Note    Patient Details  Name: Joe Perry MRN: [id number] Date of Birth: September 29, 1950  Transition of Care [facility]) CM/SW [place], [place] Phone Number: 03/26/2019, 4:33 PM  Clinical Narrative:   CSW spoke with patient's brother, [name], to discuss whether he was able to meet with the patient in the hospital to get information on his financials. [name] said he was unable to come up as he is afraid of catching COVID-19 for his own health, but he had been trying to talk to the patient on the phone to see if he can get information. [name] said the patient's phone hadn't been working.   CSW went to patient's room to check on his phone. Assisted patient with answering the phone to engage in discussion with his brother. [name] called patient back after talking with the patient, and said that the patient is still confused. [name] asked the patient for permission for he and his wife to go through the patient's belongings at home to try to find some information on his belongings. [name] says he is concerned about feeling like he is taking advantage of his brother. CSW indicated that if we are unable to get this information, then the patient will have to go home; we cannot pursue rehab placement without his financial information. [name] indicated understanding. [name] said that even with using their native language, the patient was struggling to engage in conversation and some of his answers didn't make sense. [name] to talk with the patient's wife tonight to try to find any information within their home that they can provide to assist with completing a Medicaid application.    Expected Discharge Plan: [place] Barriers to Discharge: Continued Medical Work up, SNF Pending Medicaid  Expected Discharge Plan and Services Expected Discharge Plan: [place] In-house Referral: Clinical Social Work Discharge Planning Services:  NA Post Acute Care Choice: [place] Living arrangements for the past 2 months: Single Family Home                 DME Arranged: N/A DME Agency: NA     Representative spoke with at DME Agency: na HH Arranged: NA [place] Agency: NA         Social Determinants of Health (SDOH) Interventions    Readmission Risk Interventions No flowsheet data found.

## 2019-03-26 NOTE — Progress Notes (Signed)
Occupational Therapy Treatment Patient Details Name: Joe Perry MRN: [id number] DOB: 01/18/1951 Today's Date: 03/26/2019    History of present illness 68 yo male s/p  R PCOM and R PCA occlusion s/p emergent thrombectomy on 7/6. Pt admitteed with L sided weakness, R gaze perference, L homonymous hemianopsia. 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   OT comments  Pt seen for skilled co-treat with PTA with focus on visual scanning to the L during ambulating. Pt needing +2 hand held assist for ambulating with max multimodal cuing to scan to the L. Pt unable to fully turn head to L to see object and must turn entire body to see object. Pt requiring cuing to attend to task as well. Pt returning to sit in recliner chair at end of session with chair alarm activated. Pt continues to benefit from OT intervention.   Follow Up Recommendations  SNF;Supervision/Assistance - 24 hour    Equipment Recommendations  Other (comment)(defer to next venue of care)       Precautions / Restrictions Precautions Precautions: Fall Precaution Comments: L hemianopsia; L inattention Restrictions Weight Bearing Restrictions: No       Mobility Bed Mobility Overal bed mobility: Needs Assistance Bed Mobility: Supine to Sit Rolling: Min assist   Supine to sit: Min assist     General bed mobility comments: increased time and effort required; assist to elevate trunk into sitting   Transfers Overall transfer level: Needs assistance Equipment used: 2 person hand held assist Transfers: Sit to/from Stand Sit to Stand: Min assist;+2 physical assistance Stand pivot transfers: Mod assist            Balance Overall balance assessment: Needs assistance Sitting-balance support: Feet supported;Single extremity supported Sitting balance-[name]: Fair Sitting balance - Comments: Pt frequently losing balance posteriorly Postural control: Posterior lean Standing balance support: Bilateral upper extremity  supported;During functional activity Standing balance-[name]: Poor          ADL either performed or assessed with clinical judgement   ADL   Eating/Feeding: Set up          Vision Baseline Vision/History: No visual deficits Patient Visual Report: No change from baseline Ocular Range of Motion: Restricted on the left Tracking/Visual Pursuits: Left eye does not track laterally;Impaired - to be further tested in functional context Visual Fields: Left visual field deficit          Cognition Arousal/Alertness: Awake/alert Behavior During Therapy: Impulsive;Flat affect Overall Cognitive Status: Impaired/Different from baseline Area of Impairment: Following commands;Safety/judgement;Problem solving;Orientation                 Orientation Level: Time;Situation;Place Current Attention Level: Sustained Memory: Decreased recall of precautions;Decreased short-term memory Following Commands: Follows one step commands with increased time;Follows one step commands consistently Safety/Judgement: Decreased awareness of safety;Decreased awareness of deficits Awareness: Intellectual Problem Solving: Requires verbal cues;Requires tactile cues General Comments: required max multimodal cuing for visual scanning                   Pertinent Vitals/ Pain       Pain Assessment: Faces Faces Pain Scale: No hurt         Frequency  Min 2X/week        Progress Toward Goals  OT Goals(current goals can now be found in the care plan section)  Progress towards OT goals: Progressing toward goals  Acute Rehab OT Goals Patient Stated Goal: none stated OT Goal Formulation: Patient unable to participate in goal setting  Plan Discharge plan remains appropriate    Co-evaluation    PT/OT/SLP Co-Evaluation/Treatment: Yes Reason for Co-Treatment: Complexity of the patient's impairments (multi-system involvement);For patient/therapist safety;To address functional/ADL transfers    OT goals addressed during session: ADL's and self-care;Strengthening/ROM      AM-PAC OT "6 Clicks" Daily Activity     Outcome Measure   Help from another person eating meals?: A Little Help from another person taking care of personal grooming?: A Little Help from another person toileting, which includes using toliet, bedpan, or urinal?: A Lot Help from another person bathing (including washing, rinsing, drying)?: A Little Help from another person to put on and taking off regular upper body clothing?: A Little Help from another person to put on and taking off regular lower body clothing?: A Lot 6 Click Score: 16    End of Session Equipment Utilized During Treatment: Gait belt  OT Visit Diagnosis: Unsteadiness on feet (R26.81);Muscle weakness (generalized) (M62.81)   Activity Tolerance Patient tolerated treatment well   Patient Left in chair;with call bell/phone within reach;with chair alarm set   Nurse Communication Mobility status        Time: 5784-6962 OT Time Calculation (min): 26 min  Charges: OT General Charges $OT Visit: 1 Visit OT Treatments $Therapeutic Activity: 8-22 mins   Kathyann Spaugh, Latanya Presser, MS, OTR/L 03/26/2019, 12:35 PM

## 2019-03-26 NOTE — Progress Notes (Signed)
Spoke with Zella Ball RN regarding placing PIV. Patient has been pulling PIVs out. She stated that she would f/u with a new consult if patient still needs and IV placed. Fran Lowes, RN [facility]AST

## 2019-03-26 NOTE — Progress Notes (Signed)
  Speech Language Pathology Treatment: Cognitive-Linquistic  Patient Details Name: Joe Perry MRN: [id number] DOB: 09/13/1950 Today's Date: 03/26/2019 Time: 0370-4888 SLP Time Calculation (min) (ACUTE ONLY): 34 min  Assessment / Plan / Recommendation Clinical Impression  Pt was seen for cognitive-linguistic treatment. He was cooperative during the session and speech was intelligibility was adequate. He was oriented to person and place but required cues for orientation to time. He achieved 40% accuracy with simple reasoning increasing to 100% with mod-max cues. He was unable to complete abstract reasoning tasks despite cues. He achieved 100% accuracy with 3-item immediate recall but required moderate support for immediate recall of 4 items and demonstrated 75% accuracy. He achieved 25% accuracy with responsive naming increasing to 75% with mod cues. He completed verbally-presented sentences with 60% accuracy. Perseveration of phrases was noted during the session but pt was able to respond to open-ended questions and produce short phrases. Pt expressed that he likes game shows as well as hokey, football, and tennis and his TV was left on a channel with a discussion about football. SLP will continue to follow pt.     HPI HPI: 68 year old male admitted 03/10/2019 with left weakness, unsteady gait, right gaze, left hemianopsia. PMH: CVA, HTN, DM. CT negative. MRI Ischemic infarct of the right posterior cerebral artery, territory, roughly corresponding to the ischemic volume.      SLP Plan  Continue with current plan of care       Recommendations                   Oral Care Recommendations: Oral care BID Follow up Recommendations: Skilled Nursing facility SLP Visit Diagnosis: Cognitive communication deficit (R41.841);Aphasia (R47.01);Dysphagia, unspecified (R13.10) Plan: Continue with current plan of care       Nikolus Marczak I. Hardin Negus, [place], [place] Office  number [phone] Pager [place] 03/26/2019, 2:43 PM

## 2019-03-26 NOTE — Progress Notes (Signed)
Physical Therapy Treatment Patient Details Name: Joe Perry MRN: [id number] DOB: [birth year] Today's Date: 03/26/2019    History of Present Illness 68 yo male s/p  R PCOM and R PCA occlusion s/p emergent thrombectomy on 7/6. Pt admitteed with L sided weakness, R gaze perference, L homonymous hemianopsia. 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    PT Comments    Patient seen for mobility progression. Pt continues to present with impaired cognition, balance, and vision increasing risk for falls. Continue to progress as tolerated with anticipated d/c to SNF for further skilled PT services.      Follow Up Recommendations  SNF;Supervision/Assistance - 24 hour     Equipment Recommendations  None recommended by PT    Recommendations for Other Services       Precautions / Restrictions Precautions Precautions: Fall Precaution Comments: L hemianopsia; L inattention Restrictions Weight Bearing Restrictions: No    Mobility  Bed Mobility Overal bed mobility: Needs Assistance Bed Mobility: Supine to Sit Rolling: Min assist   Supine to sit: Min assist     General bed mobility comments: assist to bring bilat LE from EOB and to elevate trunk into sitting; pt requires cues for sequencing   Transfers Overall transfer level: Needs assistance Equipment used: 2 person hand held assist Transfers: Sit to/from Stand Sit to Stand: Min assist;+2 physical assistance Stand pivot transfers: Mod assist       General transfer comment: assist to power up into standing and to steady once in standing   Ambulation/Gait Ambulation/Gait assistance: Mod assist;+2 physical assistance Gait Distance (Feet): (~120 ft total) Assistive device: 2 person hand held assist Gait Pattern/deviations: Step-through pattern;Wide base of support;Decreased stride length;Decreased stance time - left;Decreased dorsiflexion - left;Trunk flexed;Decreased step length - right Gait velocity: decreased   General Gait  Details: cues for upright posture, increased bilat step lengths, and attending to L side    Stairs             Wheelchair Mobility    Modified Rankin (Stroke Patients Only) Modified Rankin (Stroke Patients Only) Pre-Morbid Rankin Score: No symptoms Modified Rankin: Moderately severe disability     Balance Overall balance assessment: Needs assistance Sitting-balance support: Feet supported;Single extremity supported Sitting balance-[name]: Fair Sitting balance - Comments: Pt frequently losing balance posteriorly Postural control: Posterior lean Standing balance support: Bilateral upper extremity supported;During functional activity Standing balance-[name]: Poor                              Cognition Arousal/Alertness: Awake/alert Behavior During Therapy: Impulsive;Flat affect Overall Cognitive Status: Impaired/Different from baseline Area of Impairment: Following commands;Safety/judgement;Problem solving;Orientation                 Orientation Level: Time;Situation;Place Current Attention Level: Sustained Memory: Decreased recall of precautions;Decreased short-term memory Following Commands: Follows one step commands with increased time;Follows one step commands consistently Safety/Judgement: Decreased awareness of safety;Decreased awareness of deficits Awareness: Intellectual Problem Solving: Requires verbal cues;Requires tactile cues;Difficulty sequencing General Comments: required max multimodal cuing for visual scanning      Exercises      General Comments        Pertinent Vitals/Pain Pain Assessment: Faces Faces Pain Scale: No hurt    Home Living                      Prior Function  PT Goals (current goals can now be found in the care plan section) Acute Rehab PT Goals Patient Stated Goal: none stated Progress towards PT goals: Progressing toward goals    Frequency    Min 3X/week      PT  Plan Current plan remains appropriate    Co-evaluation   Reason for Co-Treatment: Complexity of the patient's impairments (multi-system involvement);For patient/therapist safety;To address functional/ADL transfers   OT goals addressed during session: ADL's and self-care;Strengthening/ROM      AM-PAC PT "6 Clicks" Mobility   Outcome Measure  Help needed turning from your back to your side while in a flat bed without using bedrails?: A Little Help needed moving from lying on your back to sitting on the side of a flat bed without using bedrails?: A Little Help needed moving to and from a bed to a chair (including a wheelchair)?: A Little Help needed standing up from a chair using your arms (e.g., wheelchair or bedside chair)?: A Little Help needed to walk in hospital room?: A Lot Help needed climbing 3-5 steps with a railing? : Total 6 Click Score: 15    End of Session Equipment Utilized During Treatment: Gait belt Activity Tolerance: Patient tolerated treatment well Patient left: with call bell/phone within reach;in chair;with chair alarm set Nurse Communication: Mobility status PT Visit Diagnosis: Muscle weakness (generalized) (M62.81);Hemiplegia and hemiparesis;Other abnormalities of gait and mobility (R26.89) Hemiplegia - Right/Left: Left Hemiplegia - dominant/non-dominant: Dominant Hemiplegia - caused by: Cerebral infarction     Time: 9728-2060 PT Time Calculation (min) (ACUTE ONLY): 26 min  Charges:  $Gait Training: 8-22 mins                     Earney Navy, PTA Acute Rehabilitation Services Pager: [phone] Office: [phone]     Darliss Cheney 03/26/2019, 1:21 PM

## 2019-03-26 NOTE — Progress Notes (Signed)
STROKE TEAM PROGRESS NOTE   Interval history:  No changes.  No complaints.  He had a weight transfer to nursing home patient is a difficult transfer as per social work  Vitals:   03/26/19 0010 03/26/19 0346 03/26/19 0445 03/26/19 0842  BP: 126/81 (!) 163/89 (!) 146/80 132/72  Pulse: (!) 104 (!) 104  (!) 101  Resp: 16 16  20   Temp: 98 F (36.7 C) 98.1 F (36.7 C)  97.7 F (36.5 C)  TempSrc: Oral Oral  Oral  SpO2: 100% 99%  100%  Weight:      Height:        CBC:  Recent Labs  Lab 03/19/19 1520 03/22/19 1435  WBC 8.4 6.1  HGB 14.1 13.8  HCT 42.0 41.5  MCV 90.7 92.6  PLT 281 119    Basic Metabolic Panel:  Recent Labs  Lab 03/19/19 1520 03/22/19 1435  NA 140 138  K 3.9 3.9  CL 106 104  CO2 25 22  GLUCOSE 98 104*  BUN 13 14  CREATININE 0.88 0.99  CALCIUM 9.8 9.5    PHYSICAL EXAM      General - Well nourished, well developed, middle-aged African male Ophthalmologic - fundi not visualized due to noncooperation.  Cardiovascular - Regular rate and rhythm.  Mental Status -  Awake alert, orientated to self, place, but not orientated to age, time. Language exam showed slightly nonfluent  speech intermittent word finding difficulty.  Cranial Nerves II - XII - II - left hemianopia. III, IV, VI - Extraocular movements intact, however, attending more on the right due to left hemianopia. V - Facial sensation intact bilaterally. VII - Facial movement intact bilaterally. VIII - Hearing & vestibular intact bilaterally. X - Palate elevates symmetrically. XI - Chin turning & shoulder shrug intact bilaterally. XII - Tongue protrusion intact.  Motor Strength - [name] was normal in all extremities and pronator drift was absent.  Bulk was normal and fasciculations were absent.   Motor Tone - Muscle tone was assessed at the neck and appendages and was normal.  Reflexes - [name] were symmetrical in all extremities and he had no pathological  reflexes.  Sensory - Light touch, temperature/pinprick were assessed and were symmetrical.    Coordination - slightly impaired on the left  gait and Station - not tested   ASSESSMENT/PLAN [name] is a 68 y.o. 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 Old R frontal infarct.    CTA head & neck - R P1 occlusion. Small BA w/ high grade atheromatous narrowing. L ICA siphon stenosis.  CT perfusion 19 cc penumbra  Cerebral angio TICI 2b revascularization occluded R PCOM and P PCA. Underlying large vessel atherosclerosis   MRI Rt PCA infarct    2D Echo EF 60 to 65%.   Recommend 30-day OP monitoring to rule out A. fib   HIV neg  LDL 101   HgbA1c 9.8  SCDs for VTE prophylaxis  aspirin 81 mg daily and clopidogrel 75 mg daily prior to admission, put on aspirin 325 mg daily and clopidogrel 75 mg daily. However, developed itchiness, plavix was discontinued. Now on ASA 81 and Brilinta 90 bid. Continue on discharge.  Therapy recommendations:  SNF  Disposition:  Pending  Patient remains stable without change  Education officer, museum following for MCD application   Hx stroke/TIA  11/2018 - presented for dizziness and unsteady, MRI showed right frontal and parietal small white matter infarcts. No LVO. Extensive SVD.  Put on DAPT.  Patient signed [facility]  01/2016 - admitted for gait disability.  MRI negative for acute infarct.  Old infarcts B BG, thalami, L cerebellum.   Itchiness, likely due to side effect from plavix Drowsy/Sleepy, medication effect, improving  Concerning for itchiness all over 03/20/19 as he is scratching his legs and genital area  Benadryl PRN  Give with Benadryl and Valium 1 dose 7/16  Discontinued Plavix and gabapentin  Now on benadryl  prn  Hypertension  Home meds:  None listed  Stable  On Lotensin 10 mg . Long-term BP goal normotensive  Hyperlipidemia  Home meds:  No statin listed  LDL 101, goal < 70  On Lipitor 40  Continue statin on discharge  Diabetes type II, uncontrolled  Home meds:  glucophage 1000 bid, resumed 7/11  HgbA1c 9.8, goal < 7.0  Hyperglycemia improved  CBGs  SSI  Close PCP follow-up  Dysphagia . Secondary to stroke . On D3 thin liquids . Speech on board . Gentle hydration, IV fluid at 40 cc  Other Stroke Risk Factors  Advanced age  Overweight, Body mass index is 29.3 kg/m., recommend weight loss, diet and exercise as appropriate   Hospital day # 16  Patient remains medically stable for transfer to skilled nursing facility unfortunately this is going to take some time as patient still has Medicaid application to be filled out as per case manager and patient's family has to take initiative to help him do so.  Antony Contras, MD Medical Director [facility] Pager: [phone] 03/26/2019 11:05 AM    To contact Stroke Continuity provider, please refer to [email or web address] After hours, contact General Neurology

## 2019-03-27 LAB — GLUCOSE, CAPILLARY
Glucose-Capillary: 111 mg/dL — ABNORMAL HIGH (ref 70–99)
Glucose-Capillary: 190 mg/dL — ABNORMAL HIGH (ref 70–99)
Glucose-Capillary: 189 mg/dL — ABNORMAL HIGH (ref 70–99)
Glucose-Capillary: 79 mg/dL (ref 70–99)

## 2019-03-27 NOTE — Progress Notes (Signed)
STROKE TEAM PROGRESS NOTE   Interval history:  Patient is sitting up comfortably in bed.  He has no complaints.  He pulled peripheral IV out.  He has been eating well and does not have any IV medication needs hence will not replace it.  Social worker spoke to the patient's brother yesterday and he will try to go to the patient's home and look through documents to get some helpful information to file with Medicaid application  Vitals:   74/12/87 2022 03/27/19 0022 03/27/19 0324 03/27/19 0911  BP: (!) 134/91 (!) 139/96 136/87 127/76  Pulse: 90 90 97 (!) 102  Resp: 19 18 18 14   Temp: 98.4 F (36.9 C) 98.6 F (37 C) (!) 97.4 F (36.3 C) 98.6 F (37 C)  TempSrc: Oral Oral Oral Oral  SpO2: 100% 97% 98% 98%  Weight:      Height:        CBC:  Recent Labs  Lab 03/22/19 1435 03/26/19 1503  WBC 6.1 6.7  HGB 13.8 13.3  HCT 41.5 39.2  MCV 92.6 91.0  PLT 284 867    Basic Metabolic Panel:  Recent Labs  Lab 03/22/19 1435 03/26/19 1503  NA 138 140  K 3.9 4.2  CL 104 106  CO2 22 25  GLUCOSE 104* 100*  BUN 14 11  CREATININE 0.99 0.86  CALCIUM 9.5 9.9    PHYSICAL EXAM General - Well nourished, well developed, middle-aged African male Ophthalmologic - fundi not visualized due to noncooperation.  Cardiovascular - Regular rate and rhythm.  Mental Status -  Awake alert, orientated to self, place, but not orientated to age, time. Language exam showed slightly nonfluent  speech intermittent word finding difficulty.  Cranial Nerves II - XII - II - left hemianopia. III, IV, VI - Extraocular movements intact, however, attending more on the right due to left hemianopia. V - Facial sensation intact bilaterally. VII - Facial movement intact bilaterally. VIII - Hearing & vestibular intact bilaterally. X - Palate elevates symmetrically. XI - Chin turning & shoulder shrug intact bilaterally. XII - Tongue protrusion intact.  Motor Strength - [name] was normal in all  extremities and pronator drift was absent.  Bulk was normal and fasciculations were absent.   Motor Tone - Muscle tone was assessed at the neck and appendages and was normal.  Reflexes - [name] were symmetrical in all extremities and he had no pathological reflexes.  Sensory - Light touch, temperature/pinprick were assessed and were symmetrical.    Coordination - slightly impaired on the left  gait and Station - not tested   ASSESSMENT/PLAN [name] is a 68 y.o. 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 Old R frontal infarct.    CTA head & neck - R P1 occlusion. Small BA w/ high grade atheromatous narrowing. L ICA siphon stenosis.  CT perfusion 19 cc penumbra  Cerebral angio TICI 2b revascularization occluded R PCOM and P PCA. Underlying large vessel atherosclerosis   MRI Rt PCA infarct    2D Echo EF 60 to 65%.   Recommend 30-day OP monitoring to rule out A. fib   HIV neg  LDL 101   HgbA1c 9.8  SCDs for VTE prophylaxis  aspirin 81 mg daily and clopidogrel 75  mg daily prior to admission, put on aspirin 325 mg daily and clopidogrel 75 mg daily. However, developed itchiness, plavix was discontinued. Now on ASA 81 and Brilinta 90 bid. Continue on discharge.  Therapy recommendations:  SNF  Disposition:  Pending   Patient remains stable without change  Social worker following for MCD application   Hx stroke/TIA  11/2018 - presented for dizziness and unsteady, MRI showed right frontal and parietal small white matter infarcts. No LVO. Extensive SVD.  Put on DAPT.  Patient signed [facility]  01/2016 - admitted for gait disability.  MRI negative for acute infarct.  Old infarcts B BG, thalami, L cerebellum.   Itchiness, likely due to side effect from  plavix Drowsy/Sleepy, medication effect, improving  Concerning for itchiness all over 03/20/19 as he is scratching his legs and genital area  Benadryl PRN  Give with Benadryl and Valium 1 dose 7/16  Discontinued Plavix and gabapentin  Now on benadryl prn  Hypertension  Home meds:  None listed  Stable  On Lotensin 10 mg . Long-term BP goal normotensive  Hyperlipidemia  Home meds:  No statin listed  LDL 101, goal < 70  On Lipitor 40  Continue statin on discharge  Diabetes type II, uncontrolled  Home meds:  glucophage 1000 bid, resumed 7/11  HgbA1c 9.8, goal < 7.0  Hyperglycemia improved  CBGs  SSI  Close PCP follow-up  Dysphagia, resolved . Secondary to stroke . On D3 thin liquids . Speech on board  Other Stroke Risk Factors  Advanced age  Overweight, Body mass index is 29.3 kg/m., recommend weight loss, diet and exercise as appropriate   Given continued hospitalization, neuro checks, vital signs decreased to hospital standard assessment. IVF not needed.   Hospital day # 17  Medically stable for discharge to nursing home but awaiting Medicaid application and nursing home bed availability  Antony Contras, [place] Pager: [phone] 03/27/2019 11:43 AM    To contact Stroke Continuity provider, please refer to [email or web address] After hours, contact General Neurology

## 2019-03-28 LAB — GLUCOSE, CAPILLARY
Glucose-Capillary: 115 mg/dL — ABNORMAL HIGH (ref 70–99)
Glucose-Capillary: 115 mg/dL — ABNORMAL HIGH (ref 70–99)
Glucose-Capillary: 105 mg/dL — ABNORMAL HIGH (ref 70–99)
Glucose-Capillary: 122 mg/dL — ABNORMAL HIGH (ref 70–99)

## 2019-03-28 NOTE — Progress Notes (Signed)
STROKE TEAM PROGRESS NOTE   Interval history:  Patient is lying comfortably in bed and being clean by nursing tech.  He has no complaints but continues to have expressive speech difficulties. Vitals:   03/27/19 1207 03/27/19 1952 03/28/19 0023 03/28/19 0349  BP: 112/68 (!) 147/109 (!) 182/108 (!) 153/103  Pulse: 96 (!) 110 99 (!) 109  Resp:  18 20 18   Temp:  97.9 F (36.6 C) 97.6 F (36.4 C) 98 F (36.7 C)  TempSrc:  Oral Oral Oral  SpO2:  100% 100% 100%  Weight:      Height:        CBC:  Recent Labs  Lab 03/22/19 1435 03/26/19 1503  WBC 6.1 6.7  HGB 13.8 13.3  HCT 41.5 39.2  MCV 92.6 91.0  PLT 284 094    Basic Metabolic Panel:  Recent Labs  Lab 03/22/19 1435 03/26/19 1503  NA 138 140  K 3.9 4.2  CL 104 106  CO2 22 25  GLUCOSE 104* 100*  BUN 14 11  CREATININE 0.99 0.86  CALCIUM 9.5 9.9    PHYSICAL EXAM General - Well nourished, well developed, middle-aged African male Ophthalmologic - fundi not visualized due to noncooperation.  Cardiovascular - Regular rate and rhythm.  Mental Status -  Awake alert, orientated to self, place, but not orientated to age, time. Language exam showed slightly nonfluent  speech intermittent word finding difficulty.  Cranial Nerves II - XII - II - left hemianopia. III, IV, VI - Extraocular movements intact, however, attending more on the right due to left hemianopia. V - Facial sensation intact bilaterally. VII - Facial movement intact bilaterally. VIII - Hearing & vestibular intact bilaterally. X - Palate elevates symmetrically. XI - Chin turning & shoulder shrug intact bilaterally. XII - Tongue protrusion intact.  Motor Strength - [name] was normal in all extremities and pronator drift was absent.  Bulk was normal and fasciculations were absent.   Motor Tone - Muscle tone was assessed at the neck and appendages and was normal.  Reflexes - [name] were symmetrical in all extremities and he  had no pathological reflexes.  Sensory - Light touch, temperature/pinprick were assessed and were symmetrical.    Coordination - slightly impaired on the left  gait and Station - not tested   ASSESSMENT/PLAN Mr. RAEKWAN SPELMAN is a 68 y.o. 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 Old R frontal infarct.    CTA head & neck - R P1 occlusion. Small BA w/ high grade atheromatous narrowing. L ICA siphon stenosis.  CT perfusion 19 cc penumbra  Cerebral angio TICI 2b revascularization occluded R PCOM and P PCA. Underlying large vessel atherosclerosis   MRI Rt PCA infarct    2D Echo EF 60 to 65%.   Recommend 30-day OP monitoring to rule out A. fib   HIV neg  LDL 101   HgbA1c 9.8  SCDs for VTE prophylaxis  aspirin 81 mg daily and clopidogrel 75 mg daily prior to admission, put on aspirin 325 mg daily and clopidogrel 75 mg daily. However, developed itchiness, plavix was discontinued. Now on ASA 81 and Brilinta 90 bid. Continue on discharge.  Therapy recommendations:  SNF  Disposition:  Pending   Patient remains stable without change  Social worker following for MCD application   Hx stroke/TIA  11/2018 - presented for dizziness and unsteady, MRI showed right frontal and parietal small white matter infarcts. No LVO. Extensive SVD.  Put on DAPT.  Patient signed [facility]  01/2016 - admitted for gait disability.  MRI negative for acute infarct.  Old infarcts B BG, thalami, L cerebellum.   Itchiness, likely due to side effect from plavix Drowsy/Sleepy, medication effect, improving  Concerning for itchiness all over 03/20/19 as he is scratching his legs and genital area  Benadryl PRN  Give with Benadryl and Valium 1 dose 7/16  Discontinued Plavix and gabapentin  Now on benadryl  prn  Hypertension  Home meds:  None listed  Stable  On Lotensin 10 mg . Long-term BP goal normotensive  Hyperlipidemia  Home meds:  No statin listed  LDL 101, goal < 70  On Lipitor 40  Continue statin on discharge  Diabetes type II, uncontrolled  Home meds:  glucophage 1000 bid, resumed 7/11  HgbA1c 9.8, goal < 7.0  Hyperglycemia improved  CBGs  SSI  Close PCP follow-up  Dysphagia, resolved . Secondary to stroke . On D3 thin liquids . Speech on board  Other Stroke Risk Factors  Advanced age  Overweight, Body mass index is 29.3 kg/m., recommend weight loss, diet and exercise as appropriate   Given continued hospitalization, neuro checks, vital signs decreased to hospital standard assessment. IVF not needed.   Hospital day # 18  Medically stable for discharge to nursing home but awaiting Medicaid application and nursing home bed availability  Antony Contras, MD To contact Stroke Continuity provider, please refer to [email or web address] After hours, contact General Neurology

## 2019-03-28 NOTE — Progress Notes (Signed)
Physical Therapy Treatment Patient Details Name: Joe Perry MRN: [id number] DOB: 12-24-1950 Today's Date: 03/28/2019    History of Present Illness 68 yo male s/p  R PCOM and R PCA occlusion s/p emergent thrombectomy on 7/6. Pt admitteed with L sided weakness, R gaze perference, L homonymous hemianopsia. 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    PT Comments    Patient seen for mobility progression. Pt continues to be oriented to self only and is impulsive/restless. Pt requires mod A for gait training this session and pt with several LOB. Max cues multimodal cues for visual tracking to L. Continue to progress as tolerated with anticipated d/c to SNF for further skilled PT services.     Follow Up Recommendations  SNF;Supervision/Assistance - 24 hour     Equipment Recommendations  None recommended by PT    Recommendations for Other Services       Precautions / Restrictions Precautions Precautions: Fall Precaution Comments: L hemianopsia; L inattention Restrictions Weight Bearing Restrictions: No    Mobility  Bed Mobility Overal bed mobility: Needs Assistance Bed Mobility: Supine to Sit     Supine to sit: Min guard     General bed mobility comments: use of rail and min guard for safety  Transfers Overall transfer level: Needs assistance Equipment used: None Transfers: Sit to/from Stand Sit to Stand: Min assist;Mod assist         General transfer comment: min A to power up and mod A for balance upon standing   Ambulation/Gait Ambulation/Gait assistance: Mod assist Gait Distance (Feet): 140 Feet Assistive device: 1 person hand held assist(and assist at trunk with gait belt) Gait Pattern/deviations: Step-through pattern;Wide base of support;Decreased stride length;Decreased stance time - left;Decreased dorsiflexion - left;Decreased step length - right;Drifts right/left Gait velocity: decreased   General Gait Details: assistance for balance and weight shifting  and cues for visual scanning to L side; pt has poor coordination and is unsteady adn with LOB X 3-4   Stairs             Wheelchair Mobility    Modified Rankin (Stroke Patients Only) Modified Rankin (Stroke Patients Only) Pre-Morbid Rankin Score: No symptoms Modified Rankin: Moderately severe disability     Balance Overall balance assessment: Needs assistance Sitting-balance support: Feet supported;Single extremity supported Sitting balance-[name]: Poor Sitting balance - Comments: Pt frequently losing balance posteriorly Postural control: Posterior lean Standing balance support: During functional activity;Single extremity supported Standing balance-[name]: Poor                              Cognition Arousal/Alertness: Awake/alert Behavior During Therapy: Impulsive;Flat affect;Restless Overall Cognitive Status: Impaired/Different from baseline Area of Impairment: Following commands;Safety/judgement;Problem solving;Orientation                 Orientation Level: Time;Situation;Place Current Attention Level: Sustained Memory: Decreased recall of precautions;Decreased short-term memory Following Commands: Follows one step commands with increased time;Follows one step commands consistently Safety/Judgement: Decreased awareness of safety;Decreased awareness of deficits Awareness: Intellectual Problem Solving: Requires verbal cues;Requires tactile cues;Difficulty sequencing General Comments: required max multimodal cuing for visual scanning      Exercises      General Comments        Pertinent Vitals/Pain Pain Assessment: Faces Faces Pain Scale: No hurt    Home Living  Prior Function            PT Goals (current goals can now be found in the care plan section) Progress towards PT goals: Progressing toward goals    Frequency    Min 3X/week      PT Plan Current plan remains appropriate     Co-evaluation              AM-PAC PT "6 Clicks" Mobility   Outcome Measure  Help needed turning from your back to your side while in a flat bed without using bedrails?: A Little Help needed moving from lying on your back to sitting on the side of a flat bed without using bedrails?: A Little Help needed moving to and from a bed to a chair (including a wheelchair)?: A Little Help needed standing up from a chair using your arms (e.g., wheelchair or bedside chair)?: A Little Help needed to walk in hospital room?: A Lot Help needed climbing 3-5 steps with a railing? : Total 6 Click Score: 15    End of Session Equipment Utilized During Treatment: Gait belt Activity Tolerance: Patient tolerated treatment well Patient left: with call bell/phone within reach;in bed;with bed alarm set Nurse Communication: Mobility status PT Visit Diagnosis: Muscle weakness (generalized) (M62.81);Hemiplegia and hemiparesis;Other abnormalities of gait and mobility (R26.89) Hemiplegia - Right/Left: Left Hemiplegia - dominant/non-dominant: Dominant Hemiplegia - caused by: Cerebral infarction     Time: 3212-2482 PT Time Calculation (min) (ACUTE ONLY): 18 min  Charges:  $Gait Training: 8-22 mins                     Earney Navy, PTA Acute Rehabilitation Services Pager: [phone] Office: [phone]     Darliss Cheney 03/28/2019, 3:34 PM

## 2019-03-29 LAB — BASIC METABOLIC PANEL
Anion gap: 13 (ref 5–15)
BUN: 23 mg/dL (ref 8–23)
CO2: 23 mmol/L (ref 22–32)
Calcium: 9.8 mg/dL (ref 8.9–10.3)
Chloride: 101 mmol/L (ref 98–111)
Creatinine, Ser: 1.2 mg/dL (ref 0.61–1.24)
GFR calc Af Amer: >60 mL/min (ref >60)
GFR calc non Af Amer: >60 mL/min (ref >60)
Glucose, Bld: 116 mg/dL — ABNORMAL HIGH (ref 70–99)
Potassium: 4 mmol/L (ref 3.5–5.1)
Sodium: 137 mmol/L (ref 135–145)

## 2019-03-29 LAB — GLUCOSE, CAPILLARY
Glucose-Capillary: 118 mg/dL — ABNORMAL HIGH (ref 70–99)
Glucose-Capillary: 158 mg/dL — ABNORMAL HIGH (ref 70–99)
Glucose-Capillary: 98 mg/dL (ref 70–99)
Glucose-Capillary: 131 mg/dL — ABNORMAL HIGH (ref 70–99)

## 2019-03-29 LAB — CBC
HCT: 43.4 % (ref 39.0–52.0)
Hemoglobin: 14.6 g/dL (ref 13.0–17.0)
MCH: 31.1 pg (ref 26.0–34.0)
MCHC: 33.6 g/dL (ref 30.0–36.0)
MCV: 92.3 fL (ref 80.0–100.0)
Platelets: 360 10*3/uL (ref 150–400)
RBC: 4.7 MIL/uL (ref 4.22–5.81)
RDW: 12.5 % (ref 11.5–15.5)
WBC: 7.4 10*3/uL (ref 4.0–10.5)
nRBC: 0 % (ref 0.0–0.2)

## 2019-03-29 LAB — HEPATIC FUNCTION PANEL
ALT: 61 U/L — ABNORMAL HIGH (ref 0–44)
AST: 53 U/L — ABNORMAL HIGH (ref 15–41)
Albumin: 3.8 g/dL (ref 3.5–5.0)
Alkaline Phosphatase: 76 U/L (ref 38–126)
Bilirubin, Direct: 0.3 mg/dL — ABNORMAL HIGH (ref 0.0–0.2)
Indirect Bilirubin: 1 mg/dL — ABNORMAL HIGH (ref 0.3–0.9)
Total Bilirubin: 1.3 mg/dL — ABNORMAL HIGH (ref 0.3–1.2)
Total Protein: 7.5 g/dL (ref 6.5–8.1)

## 2019-03-29 LAB — PLATELET INHIBITION P2Y12: Platelet Function  P2Y12: 90 [PRU] — ABNORMAL LOW (ref 182–335)

## 2019-03-29 NOTE — Progress Notes (Signed)
  Speech Language Pathology Treatment:    Patient Details Name: Joe Perry MRN: [id number] DOB: May 06, 1951 Today's Date: 03/27/2019 Time:  -     Assessment / Plan / Recommendation Clinical Impression  Patient was seen for cognitive-linguistic therapy. He was oriented to himself and place, but not to date. Even when date were given orally and written for him he could not reproduce the date. He perseverated on the year 1995. Responsive naming task was presented with 30% accuracy and increasing to 50% with semantic cues. 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 Unable to verify this to be true. Patient was very distracted throughout session, moving his legs around in the bed, but when asked reported no pain. RN notified after session.    HPI HPI: 68 year old male admitted 03/10/2019 with left weakness, unsteady gait, right gaze, left hemianopsia. PMH: CVA, HTN, DM. CT negative. MRI Ischemic infarct of the right posterior cerebral artery, territory, roughly corresponding to the ischemic volume.      SLP Plan  Continue with current plan of care       Recommendations                   Oral Care Recommendations: Oral care BID Follow up Recommendations: Skilled Nursing facility SLP Visit Diagnosis: Cognitive communication deficit (R41.841);Aphasia (R47.01);Dysphagia, unspecified (R13.10) Plan: Continue with current plan of care       [place], MA, CCC-SLP 03/29/2019 2:23 PM

## 2019-03-29 NOTE — Progress Notes (Signed)
STROKE TEAM PROGRESS NOTE   Interval history:  Patient is lying in bed, no neuro changes. Still has voluntary movement of left UE and LE, LE>UE. It can be suppressed with will but can not last.   Vitals:   03/28/19 2032 03/29/19 0526 03/29/19 0736 03/29/19 1020  BP: 132/88 130/83 (!) 143/86 114/75  Pulse: 93 100 91 (!) 102  Resp:  18 18 14   Temp:  97.7 F (36.5 C) 97.6 F (36.4 C) 98.8 F (37.1 C)  TempSrc:  Oral Oral Oral  SpO2: 99% 97% 100% 100%  Weight:      Height:        CBC:  Recent Labs  Lab 03/26/19 1503 03/29/19 0610  WBC 6.7 7.4  HGB 13.3 14.6  HCT 39.2 43.4  MCV 91.0 92.3  PLT 306 725    Basic Metabolic Panel:  Recent Labs  Lab 03/26/19 1503 03/29/19 0610  NA 140 137  K 4.2 4.0  CL 106 101  CO2 25 23  GLUCOSE 100* 116*  BUN 11 23  CREATININE 0.86 1.20  CALCIUM 9.9 9.8    PHYSICAL EXAM General - Well nourished, well developed, not in acute distress  Ophthalmologic - fundi not visualized due to noncooperation.  Cardiovascular - Regular rate and rhythm.  Mental Status -  Awake alert, orientated to self, place, but not orientated to age, time. Language exam showed slightly nonfluent  speech intermittent word finding difficulty.  Cranial Nerves II - XII - II - left hemianopia. III, IV, VI - Extraocular movements intact, however, attending more on the right due to left hemianopia. V - Facial sensation intact bilaterally. VII - Facial movement intact bilaterally. VIII - Hearing & vestibular intact bilaterally. X - Palate elevates symmetrically. XI - Chin turning & shoulder shrug intact bilaterally. XII - Tongue protrusion intact.  Motor Strength - [name] was normal in all extremities and pronator drift was absent.  Bulk was normal and fasciculations were absent.   Motor Tone - Muscle tone was assessed at the neck and appendages and was normal.  Reflexes - [name] were symmetrical in all extremities and he had no  pathological reflexes.  Sensory - Light touch, temperature/pinprick were assessed and were symmetrical.    Coordination - slightly impaired on the left. Voluntary repetitive movement left UE and LE, but LE > UE. Able to suppress by will but not lasting.   gait and Station - not tested   ASSESSMENT/PLAN [name] is a 68 y.o. 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 Old R frontal infarct.    CTA head & neck - R P1 occlusion. Small BA w/ high grade atheromatous narrowing. L ICA siphon stenosis.  CT perfusion 19 cc penumbra  Cerebral angio TICI 2b revascularization occluded R PCOM and P PCA. Underlying large vessel atherosclerosis   MRI Rt PCA infarct    2D Echo EF 60 to 65%.   Recommend 30-day OP monitoring to rule out A. fib   HIV neg  LDL 101   HgbA1c 9.8  P2Y12 90  SCDs for VTE prophylaxis  aspirin 81 mg daily and clopidogrel 75 mg daily prior to admission, put on aspirin 325 mg daily and clopidogrel 75 mg daily. However, developed itchiness, plavix was  discontinued. Now on ASA 81 and Brilinta 90 bid. Continue on discharge.  Therapy recommendations:  SNF  Disposition:  Pending   Hx stroke/TIA  11/2018 - presented for dizziness and unsteady, MRI showed right frontal and parietal small white matter infarcts. No LVO. Extensive SVD.  Put on DAPT.  Patient signed [facility]  01/2016 - admitted for gait disability.  MRI negative for acute infarct.  Old infarcts B BG, thalami, L cerebellum.   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 Long-term BP goal normotensive  Hyperlipidemia  Home meds:  No statin listed  LDL 101, goal < 70  On Lipitor 40  Continue statin on discharge  Diabetes type II, uncontrolled  Home meds:  glucophage 1000 bid, resumed 7/11  HgbA1c 9.8, goal < 7.0  Hyperglycemia improved  CBGs  SSI  Close PCP follow-up  Dysphagia, resolved . Secondary to stroke . On D3 thin liquids . Speech on board  Other Stroke Risk Factors  Advanced age  Overweight, Body mass index is 29.3 kg/m., recommend weight loss, diet and exercise as appropriate    Hospital day # 20  [name], MD PhD Stroke Neurology 03/29/2019 5:35 PM    To contact Stroke Continuity provider, please refer to [email or web address] After hours, contact General Neurology

## 2019-03-30 LAB — GLUCOSE, CAPILLARY
Glucose-Capillary: 120 mg/dL — ABNORMAL HIGH (ref 70–99)
Glucose-Capillary: 126 mg/dL — ABNORMAL HIGH (ref 70–99)
Glucose-Capillary: 112 mg/dL — ABNORMAL HIGH (ref 70–99)
Glucose-Capillary: 105 mg/dL — ABNORMAL HIGH (ref 70–99)

## 2019-03-30 MED ORDER — CARBIDOPA-LEVODOPA 25-100 MG PO TABS
0.5000 | ORAL_TABLET | Freq: Three times a day (TID) | ORAL | Status: DC
  Administered 2019-03-30: 0.5 via ORAL
  Filled 2019-03-30: qty 1

## 2019-03-30 MED ORDER — TETRABENAZINE 12.5 MG PO TABS: 12.5000 mg | ORAL_TABLET | Freq: Every day | ORAL | Status: DC

## 2019-03-30 MED ORDER — CARBIDOPA-LEVODOPA 25-100 MG PO TABS: 1.0000 | ORAL_TABLET | Freq: Three times a day (TID) | ORAL | Status: DC

## 2019-03-30 NOTE — Progress Notes (Addendum)
STROKE TEAM PROGRESS NOTE   Interval history:  Patient was initially sleeping, but easily arousable. During sleep, left arm and leg no involuntary movement. With waking up, left arm and leg started to have semi-involuntary movement.   Vitals:   03/29/19 1828 03/29/19 1946 03/29/19 2348 03/30/19 0429  BP: 124/81 103/67 112/85 (!) 132/97  Pulse: (!) 101 (!) 103 (!) 102 (!) 104  Resp: 18 18 18 20   Temp: 98.4 F (36.9 C) 98.6 F (37 C) 97.6 F (36.4 C) 98.1 F (36.7 C)  TempSrc: Oral Oral Oral Oral  SpO2: 98% 99% 99% 100%  Weight:      Height:        CBC:  Recent Labs  Lab 03/26/19 1503 03/29/19 0610  WBC 6.7 7.4  HGB 13.3 14.6  HCT 39.2 43.4  MCV 91.0 92.3  PLT 306 250    Basic Metabolic Panel:  Recent Labs  Lab 03/26/19 1503 03/29/19 0610  NA 140 137  K 4.2 4.0  CL 106 101  CO2 25 23  GLUCOSE 100* 116*  BUN 11 23  CREATININE 0.86 1.20  CALCIUM 9.9 9.8    PHYSICAL EXAM General - Well nourished, well developed, not in acute distress  Ophthalmologic - fundi not visualized due to noncooperation.  Cardiovascular - Regular rate and rhythm.  Mental Status -  Awake alert, orientated to self, place, but not orientated to age, time. Language exam showed slightly nonfluent  speech intermittent word finding difficulty.  Cranial Nerves II - XII - II - left hemianopia. III, IV, VI - Extraocular movements intact, however, attending more on the right due to left hemianopia. V - Facial sensation intact bilaterally. VII - Facial movement intact bilaterally. VIII - Hearing & vestibular intact bilaterally. X - Palate elevates symmetrically. XI - Chin turning & shoulder shrug intact bilaterally. XII - Tongue protrusion intact.  Motor Strength - [name] was normal in all extremities and pronator drift was absent.  Bulk was normal and fasciculations were absent.   Motor Tone - Muscle tone was assessed at the neck and appendages and was normal.  Reflexes -  [name] were symmetrical in all extremities and he had no pathological reflexes.  Sensory - Light touch, temperature/pinprick were assessed and were symmetrical.    Coordination - slightly impaired on the left. Semi-involuntary repetitive movement left UE and LE, but LE > UE during awake. Able to suppress by will but not lasting. Absent during sleep.   gait and Station - not tested   ASSESSMENT/PLAN [name] is a 68 y.o. 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 Old R frontal infarct.    CTA head & neck - R P1 occlusion. Small BA w/ high grade atheromatous narrowing. L ICA siphon stenosis.  CT perfusion 19 cc penumbra  Cerebral angio TICI 2b revascularization occluded R PCOM and P PCA. Underlying large vessel atherosclerosis   MRI Rt PCA infarct    2D Echo EF 60 to 65%.   Recommend 30-day OP monitoring to rule out A. fib   HIV neg  LDL 101   HgbA1c 9.8  P2Y12 90  SCDs for VTE prophylaxis  aspirin 81 mg daily and clopidogrel 75 mg daily prior to admission, put on aspirin 325 mg  daily and clopidogrel 75 mg daily. However, developed itchiness, plavix was discontinued. Now on ASA 81 and Brilinta 90 bid. Continue on discharge.  Therapy recommendations:  SNF  Disposition:  Pending   Hx stroke/TIA  11/2018 - presented for dizziness and unsteady, MRI showed right frontal and parietal small white matter infarcts. No LVO. Extensive SVD.  Put on DAPT.  Patient signed [facility]  01/2016 - admitted for gait disability.  MRI negative for acute infarct.  Old infarcts B BG, thalami, L cerebellum.   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 Long-term BP goal normotensive  Hyperlipidemia  Home meds:  No statin listed  LDL 101, goal < 70  On Lipitor 40  Continue statin on discharge  Diabetes type II, uncontrolled  Home meds:  glucophage 1000 bid, resumed 7/11  HgbA1c 9.8, goal < 7.0  Hyperglycemia improved  CBGs  SSI  Close PCP follow-up  Dysphagia, resolved . Secondary to stroke . On D3 thin liquids . Speech on board  Other Stroke Risk Factors  Advanced age  Overweight, Body mass index is 29.3 kg/m., recommend weight loss, diet and exercise as appropriate    Hospital day # 20  [name], MD PhD Stroke Neurology 03/30/2019 5:14 PM   To contact Stroke Continuity provider, please refer to [email or web address] After hours, contact General Neurology

## 2019-03-30 NOTE — Progress Notes (Signed)
Pt confused but follows commands  Pt in low bed, bed in lowest position and alarm set  Pt has all belongings in reach including call light  Pt monitored by video monitor- tele sitter Pt denies pain or needing anything at this time Will continue to monitor

## 2019-03-31 DIAGNOSIS — G255 Other chorea: Secondary | ICD-10-CM | POA: Diagnosis not present

## 2019-03-31 LAB — GLUCOSE, CAPILLARY
Glucose-Capillary: 98 mg/dL (ref 70–99)
Glucose-Capillary: 153 mg/dL — ABNORMAL HIGH (ref 70–99)
Glucose-Capillary: 92 mg/dL (ref 70–99)
Glucose-Capillary: 140 mg/dL — ABNORMAL HIGH (ref 70–99)

## 2019-03-31 MED ORDER — ARIPIPRAZOLE 10 MG PO TABS
5.0000 mg | ORAL_TABLET | Freq: Every day | ORAL | Status: DC
  Administered 2019-03-31 – 2019-04-03 (×4): 5 mg via ORAL
  Filled 2019-03-31 (×4): qty 1

## 2019-03-31 NOTE — Discharge Summary (Addendum)
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 Diabetes mellitus without complication ([place])   . Hypertension   . Stroke [facility])    Past Surgical History:  Procedure Laterality Date  . IR ANGIO INTRA EXTRACRAN SEL COM CAROTID INNOMINATE UNI L MOD SED  03/10/2019  . IR ANGIO VERTEBRAL SEL SUBCLAVIAN INNOMINATE BILAT MOD SED  03/10/2019  . IR CT HEAD LTD  03/10/2019  . IR PERCUTANEOUS ART THROMBECTOMY/INFUSION INTRACRANIAL INC DIAG ANGIO  03/10/2019  . RADIOLOGY WITH ANESTHESIA N/A 03/10/2019   Procedure: IR WITH ANESTHESIA/CODE STROKE;  Surgeon: Radiologist, Medication, MD;  Location: [place];  Service: Radiology;  Laterality: N/A;    Allergies as of 06/03/2019   No Known Allergies     Medication List    STOP taking these medications   baclofen 10 MG tablet Commonly known as: LIORESAL   clopidogrel 75 MG tablet Commonly known as: PLAVIX     TAKE these medications   amLODipine 5 MG tablet Commonly known as: NORVASC Take 1 tablet (5 mg total) by mouth daily. Start taking on: June 04, 2019   aspirin EC 81 MG  tablet Take 1 tablet (81 mg total) by mouth daily.   atorvastatin 40 MG tablet Commonly known as: LIPITOR Take 1 tablet (40 mg total) by mouth daily at 6 PM.   donepezil 10 MG tablet Commonly known as: ARICEPT Take 1 tablet (10 mg total) by mouth at bedtime.   enoxaparin 40 MG/0.4ML injection Commonly known as: LOVENOX Inject 0.4 mLs (40 mg total) into the skin daily.   insulin aspart 100 UNIT/ML injection Commonly known as: novoLOG Inject 0-9 Units into the skin 3 (three) times daily with meals.   metFORMIN 500 MG tablet Commonly known as: GLUCOPHAGE Take 1 tablet (500 mg total) by mouth 2 (two) times daily with a meal. What changed:   medication strength  how much to take   QUEtiapine 25 MG tablet Commonly known as: SEROQUEL Take 1 tablet (25 mg total) by mouth 3 (three) times daily.   senna-docusate 8.6-50 MG tablet Commonly known as: Senokot-S Take 1 tablet by mouth at bedtime.   ticagrelor 90 MG Tabs tablet Commonly known as: BRILINTA Take 1 tablet (90 mg total) by mouth 2 (two) times daily.       LABORATORY STUDIES CBC    Component Value Date/Time   WBC 5.3 05/30/2019 0347   RBC 3.61 (L) 05/30/2019 0347   HGB 11.1 (L) 05/30/2019 0347   HCT 34.0 (L) 05/30/2019 0347   PLT 294 05/30/2019 0347   MCV 94.2 05/30/2019 0347   MCH 30.7 05/30/2019  0347   MCHC 32.6 05/30/2019 0347   RDW 13.2 05/30/2019 0347   LYMPHSABS 1.4 04/25/2019 0739   MONOABS 0.3 04/25/2019 0739   EOSABS 0.1 04/25/2019 0739   BASOSABS 0.0 04/25/2019 0739   CMP    Component Value Date/Time   NA 138 05/30/2019 0347   K 3.9 05/30/2019 0347   CL 103 05/30/2019 0347   CO2 28 05/30/2019 0347   GLUCOSE 111 (H) 05/30/2019 0347   BUN 8 05/30/2019 0347   CREATININE 0.66 05/30/2019 0347   CREATININE 0.96 09/08/2013 1735   CALCIUM 9.0 05/30/2019 0347   PROT 7.0 05/28/2019 1453   ALBUMIN 3.3 (L) 05/28/2019 1453   AST 17 05/28/2019 1453   ALT 16 05/28/2019 1453   ALKPHOS 72 05/28/2019  1453   BILITOT 0.9 05/28/2019 1453   GFRNONAA >60 05/30/2019 0347   GFRNONAA 86 09/08/2013 1735   GFRAA >60 05/30/2019 0347   GFRAA >89 09/08/2013 1735   COAGS Lab Results  Component Value Date   INR 1.1 03/10/2019   INR 1.0 11/13/2018   INR 1.09 01/30/2016   Lipid Panel    Component Value Date/Time   CHOL 159 03/11/2019 0946   TRIG 67 03/11/2019 0946   HDL 45 03/11/2019 0946   CHOLHDL 3.5 03/11/2019 0946   VLDL 13 03/11/2019 0946   LDLCALC 101 (H) 03/11/2019 0946   HgbA1C  Lab Results  Component Value Date   HGBA1C 9.8 (H) 03/11/2019   Urinalysis    Component Value Date/Time   COLORURINE YELLOW 05/28/2019 2157   APPEARANCEUR HAZY (A) 05/28/2019 2157   LABSPEC 1.028 05/28/2019 2157   PHURINE 5.0 05/28/2019 2157   GLUCOSEU NEGATIVE 05/28/2019 2157   HGBUR NEGATIVE 05/28/2019 2157   BILIRUBINUR NEGATIVE 05/28/2019 2157   KETONESUR NEGATIVE 05/28/2019 2157   PROTEINUR 30 (A) 05/28/2019 2157   NITRITE NEGATIVE 05/28/2019 2157   LEUKOCYTESUR NEGATIVE 05/28/2019 2157   Urine Drug Screen     Component Value Date/Time   LABOPIA NONE DETECTED 11/13/2018 1217   COCAINSCRNUR NONE DETECTED 11/13/2018 1217   LABBENZ NONE DETECTED 11/13/2018 1217   AMPHETMU NONE DETECTED 11/13/2018 1217   THCU NONE DETECTED 11/13/2018 1217   LABBARB NONE DETECTED 11/13/2018 1217      SIGNIFICANT DIAGNOSTIC STUDIES Ct Head Code Stroke Wo Contrast 03/10/2019 1454 1. No acute finding by CT. Extensive chronic small-vessel ischemic changes throughout the brain as outlined above. Old right frontal infarction. 2. ASPECTS is 10.   Ct Angio Head W Or Wo Contrast Ct Angio Neck W Or Wo Contrast Ct Cerebral Perfusion W Contrast 03/10/2019 1517 1. Fetal type right PCA with proximal occlusion leading to 19 cc of penumbra and no infarct by CT perfusion. There may be a right P1 segment that is also occluded. 2. 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 The right  vertebral artery ends in PICA with advanced intracranial stenosis. 3. Subjectively advanced left cavernous ICA stenosis. 4. 30-40% atheromatous narrowing at the right ICA bulb.   CT angio / IR Percutaneous Art Thrombectomy 03/10/2019 S/P 4 vessel cerebral arteriogram followed by completev Revascularization Of RT PCOM and RT PCA with x 1 pass with 61mm x 68mm embotrap retriver device achieving a TICI 2b revascularization. 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 Motion degraded study. 2. Ischemic infarct of the right posterior cerebral artery territory, roughly corresponding to the ischemic volume identified on the  earlier CT perfusion scan. 3. No hemorrhage or mass effect. 4. Chronic ischemic microangiopathy and age advanced atrophy.  CT Head WO Contrast  8/72020 Evolving infarct in the right PCA territory. No progression since prior studies. No acute hemorrhage. Atrophy and extensive chronic vessel ischemia.  MRI  05/02/2019 1. Resolving diffusion restriction in the right PCA territory, with patchy residual in the right splenium. Petechial hemorrhage and post ischemic enhancement in the right thalamus and right occipital lobe. No mass effect.  2. Underlying severe chronic ischemic disease with superimposed new small acute to subacute infarcts in the:  - left basal ganglia. - right cerebellar peduncle. 3. 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  2D Echocardiogram  1. The left ventricle has normal systolic function with an ejection fraction of 60-65%. The cavity size was normal. Left ventricular diastolic Doppler parameters are consistent with impaired relaxation.  2. The right ventricle has normal systolic function. The cavity was normal. There is no increase in right ventricular wall thickness.  3. Left atrial size was mildly dilated.  4. Right atrial size was mildly dilated.  5. The  aortic root and ascending aorta are normal in size and structure.  6. The interatrial septum was not assessed.  CXR  05/28/2019 1.  No radiographic evidence of acute cardiopulmonary disease. 2. Aortic atherosclerosis.    HISTORY OF PRESENT ILLNESS RACE LATOUR is a 68 y.o. male with past medical history of stroke, hypertension and diabetes.  Per wife patient was last seen normal at 930 on 03/10/2019 ([facility]) when suddenly she noticed that her husband was weaker on the left side and did not seem to be talking as normal as he usually does.  At that point she thought he might of been hypoglycemic thus she got a wheelchair and drove him home.  His glucose was 256 and blood pressure was elevated in the 180s.  It was for this reason that she called EMS and he was brought as a code stroke to [facility].  It was discussed with family that he is out of the window for TPA at that time.-During consultation there was some language barrier however the majority of the information that was important was gathered.  CTA of head was obtained and did show a vessel that could be reached by interventional radiology.  Patient's wife was called back and they agreed to go along with having the intervention done. Premorbid modified Rankin scale (mRS): 0. NIH stroke scale-10.  [place] is a 68 y.o. 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   Stroke:   R large PCA infarct s/p IR w/ TICI2b revascularization, secondary to large vessel disease .  Stroke:   in hospital acute/subacute bilateral infarcts due to small vessel disease.   Code Stroke CT head No acute abnormality. Old R frontal infarct.    CTA head & neck - R P1 occlusion. Small BA w/ high grade atheromatous narrowing. L ICA siphon stenosis.  CT perfusion 19 cc penumbra  Cerebral angio TICI 2b revascularization occluded R PCOM and P PCA. Underlying large vessel atherosclerosis    MRI Rt PCA infarct    CT repeat - 04/11/2019 - Evolving R PCA infarct. No progression. No hemorrhage  Repeat MRI w/w/o contrast new acute/subacute infarcts left BG and right cerebellar peduncle 05/02/2019   2D Echo EF 60 to 65%.   tele monitoring negative for afib  during hospitalization   HIV neg  LDL 101   HgbA1c 9.8  P2Y12 90  Lovenox 40 mg sq daily  for VTE prophylaxis  aspirin 81 mg daily and clopidogrel 75 mg daily prior to admission, put on aspirin 325 mg daily and clopidogrel 75 mg daily. However, developed itchiness, plavix was discontinued. Now on ASA 81 and Brilinta 90 bid. Continue on discharge.  Therapy recommendations:  CIR (wife is not working now, able to provide home supervision after CIR).   Disposition:  CIR  Hx stroke/TIA  ? 01/2016 - admitted for gait disability.  MRI negative for acute infarct.  Old infarcts B BG, thalami, L cerebellum.  ? 11/2018 - presented for dizziness and unsteady, MRI showed right frontal and parietal small white matter infarcts. No LVO. Extensive SVD.  Put on DAPT.  Patient signed out AMA  Involuntary movement  Likely due to right thalamic involvement of current stroke  Semi-involuntary during awake, absent during sleep  Not able to afford tetrabenazine  Put on abilify 5->10mg . However, pt developed vomiting episodes x 2 days. Improved after off abilify.  Chorea-like movement slowly improving  Cognitive decline, agitation and restless  CT head unchanged; EEG no seizure  Intermittent restless  Seroquel 25mg  3 times daily  on aricept 10mg  daily  Monitor   Hypertension  Home meds: None listed  Lotensin 5mg  daily stopped 8/8  OnNorvasc5 mg daily  BP stable, goal normotensive    Fever with UTI  9/22 Tmax 102.3->afebrile now  Tylenol prn  WBC normal  Blood Cx NGTD  UCx > 100k staph haemolyticus, >100k enterococcus faecalis  Rocephin 9/24>>9/25. Stopped after discussion with pharmacist that drug  is not covering either organism and UA WBC only 0-5 making UTI unlikely.    CXR NAD  Hyperlipidemia  Home meds:  No statin listed  LDL 101, goal < 70  AST and ALT normalized   Continue Lipitor 40  Continue statin on discharge  Diabetes type II, uncontrolled->stable hypoglycemia  Home meds:  glucophage 1000 bid  HgbA1c 9.8, goal < 7.0  Off metformin 1000mg  due to hypoglycemia  CBGs  SSIsensitive 0-9   On metformin 500mg  bid   Glucoses remains stable  Dysphagia, resolved  On D1 thin liquids, meds in puree  Other Stroke Risk Factors  Advanced age  Overweight, Body mass index is 24.74 kg/m., recommend weight loss, diet and exercise as appropriate   No driving due to hemianopia, has discussed with wife   Other Active Problems  Constipation, abd xray ok, put on senokot, prn bisacodyl and fleets  N/V - resolved  Chronic stuttering - as per wife  Hypokalemia, resolved  4-48mm vestibular schwannoma, needs outpatient follow up  DISCHARGE EXAM Blood pressure 139/81, pulse 78, temperature (!) 97.4 F (36.3 C), temperature source Oral, resp. rate 17, height 5\' 9"  (1.753 m), weight 76 kg, SpO2 100 %. General- Well nourished, well developed, African male not in distress  Ophthalmologic- fundi not visualized due to noncooperation.  Cardiovascular - Regular rate and rhythm.  Mental Status-  Awake alert oriented to self and place, speech is nonfluent and can barely speak a few words but nods appropriately. Follows simple midline in 1 and occasional two-step commands. Unable to name and repeat.  Mild neglect on the left  Cranial Nerves II - XII- II - left dense homonymous hemianopia. III, IV, VI - Extraocular movements intact but right gaze preference with slight limitation of left lateral gaze V - Facial sensation intact bilaterally. VII -  Facial movement intact bilaterally. VIII - Hearing & vestibular intact bilaterally. X - Palate elevates  symmetrically. XI - Chin turning & shoulder shrug intact bilaterally. XII - Tongue protrusion intact.  Motor Strength -[name] was symmetrical bilaterally.Bulk was normal and fasciculations were absent.   Motor Tone- Muscle tone was assessed at the neck and appendages and was normal.  Reflexes-[name] were symmetrical in all extremities andhehad no pathological reflexes.  Sensory-Light touch, temperature/pinprick were assessed and were symmetrical. Coordination impaired left finger-to-nose and knee to heel coordination. Bilateral, L>R, chorea-like movement while awake, improved from prior, resolved on sleeping  Gait and Station-not tested  Discharge Diet   Dysphagia 3 thin liquids  DISCHARGE PLAN  Disposition:  CIR for ongoing therapy  aspirin 81 mg daily and Brilinta (ticagrelor) 90 mg bid for secondary stroke prevention   Ongoing stroke risk factor control by Primary Care Physician at time of discharge  Follow-up MD at SNF for primary care or see your own PCP in 2 weeks.  Follow-up in [facility] in 4 weeks, office to schedule an appointment.   vestibular schwannoma, needs outpatient follow up after rehab stay  40 minutes were spent preparing discharge.  Burnetta Sabin, MSN, APRN, ANVP-BC, AGPCNP-BC Advanced Practice Stroke Nurse [place] for Schedule & Pager information 06/03/2019 1:46 PM   I have personally obtained history,examined this patient, reviewed notes, independently viewed imaging studies, participated in medical decision making and plan of care.ROS completed by me personally and pertinent positives fully documented  I have made any additions or clarifications directly to the above note. Agree with note above.    Antony Contras, MD Medical Director [facility] Pager: [phone] 06/03/2019 2:38 PM

## 2019-03-31 NOTE — Progress Notes (Signed)
STROKE TEAM PROGRESS NOTE   Interval history:  Pt awake alert, lying in bed. No neuro changes, still has left hemichorea but less prominent than before. Tetrabenazine too expensive to start and continue as outpt, will continue monitoring for now given improvement over time.   Vitals:   03/31/19 0006 03/31/19 0450 03/31/19 0752 03/31/19 1128  BP: 130/78 118/72 122/78 122/86  Pulse: 99 (!) 103 91 85  Resp: 18 18 16 20   Temp: 97.9 F (36.6 C) 97.9 F (36.6 C) (!) 96.8 F (36 C) 97.7 F (36.5 C)  TempSrc: Oral Oral Axillary Oral  SpO2: 100% 99% 99% 99%  Weight:      Height:        CBC:  Recent Labs  Lab 03/26/19 1503 03/29/19 0610  WBC 6.7 7.4  HGB 13.3 14.6  HCT 39.2 43.4  MCV 91.0 92.3  PLT 306 811    Basic Metabolic Panel:  Recent Labs  Lab 03/26/19 1503 03/29/19 0610  NA 140 137  K 4.2 4.0  CL 106 101  CO2 25 23  GLUCOSE 100* 116*  BUN 11 23  CREATININE 0.86 1.20  CALCIUM 9.9 9.8    PHYSICAL EXAM General - Well nourished, well developed, not in acute distress  Ophthalmologic - fundi not visualized due to noncooperation.  Cardiovascular - Regular rate and rhythm.  Mental Status -  Awake alert, orientated to self, place, but not orientated to age, time. Language exam showed slightly nonfluent  speech intermittent word finding difficulty.  Cranial Nerves II - XII - II - left hemianopia. III, IV, VI - Extraocular movements intact, however, attending more on the right due to left hemianopia. V - Facial sensation intact bilaterally. VII - Facial movement intact bilaterally. VIII - Hearing & vestibular intact bilaterally. X - Palate elevates symmetrically. XI - Chin turning & shoulder shrug intact bilaterally. XII - Tongue protrusion intact.  Motor Strength - [name] was normal in all extremities and pronator drift was absent.  Bulk was normal and fasciculations were absent.   Motor Tone - Muscle tone was assessed at the neck and  appendages and was normal.  Reflexes - [name] were symmetrical in all extremities and he had no pathological reflexes.  Sensory - Light touch, temperature/pinprick were assessed and were symmetrical.    Coordination - slightly impaired on the left. Semi-involuntary repetitive movement left UE and LE, but LE > UE during awake. Able to suppress by will but not lasting. Absent during sleep.   gait and Station - not tested   ASSESSMENT/PLAN [name] is a 68 y.o. 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 Old R frontal infarct.    CTA head & neck - R P1 occlusion. Small BA w/ high grade atheromatous narrowing. L ICA siphon stenosis.  CT perfusion 19 cc penumbra  Cerebral angio TICI 2b revascularization occluded R PCOM and P PCA. Underlying large vessel atherosclerosis   MRI Rt PCA infarct    2D Echo EF 60 to 65%.   Recommend 30-day OP monitoring to rule out A. fib   HIV neg  LDL 101   HgbA1c 9.8  P2Y12 90  SCDs for VTE prophylaxis  aspirin 81 mg daily and clopidogrel 75 mg daily prior to admission,  put on aspirin 325 mg daily and clopidogrel 75 mg daily. However, developed itchiness, plavix was discontinued. Now on ASA 81 and Brilinta 90 bid. Continue on discharge.  Therapy recommendations:  SNF  Disposition:  Pending - SW following with family for MCD application  Hx stroke/TIA  11/2018 - presented for dizziness and unsteady, MRI showed right frontal and parietal small white matter infarcts. No LVO. Extensive SVD.  Put on DAPT.  Patient signed [facility]  01/2016 - admitted for gait disability.  MRI negative for acute infarct.  Old infarcts B BG, thalami, L cerebellum.   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 Long-term BP goal normotensive  Hyperlipidemia  Home meds:  No statin listed  LDL 101, goal < 70  On Lipitor 40  Continue statin on discharge  Diabetes type II, uncontrolled  Home meds:  glucophage 1000 bid, resumed 7/11  HgbA1c 9.8, goal < 7.0  Hyperglycemia much improved  CBGs  SSI  Close PCP follow-up  Dysphagia, resolved . Secondary to stroke . On D3 thin liquids . Speech on board  Other Stroke Risk Factors  Advanced age  Overweight, Body mass index is 29.3 kg/m., recommend weight loss, diet and exercise as appropriate    Hospital day # 21  Joe Hawking, MD PhD Stroke Neurology 03/31/2019 1:53 PM   To contact Stroke Continuity provider, please refer to [email or web address] After hours, contact General Neurology

## 2019-04-01 LAB — GLUCOSE, CAPILLARY
Glucose-Capillary: 117 mg/dL — ABNORMAL HIGH (ref 70–99)
Glucose-Capillary: 129 mg/dL — ABNORMAL HIGH (ref 70–99)
Glucose-Capillary: 99 mg/dL (ref 70–99)
Glucose-Capillary: 148 mg/dL — ABNORMAL HIGH (ref 70–99)

## 2019-04-01 NOTE — Progress Notes (Signed)
Chart reviewed for LOS; talked to Stevphen [place], she stated that the patient's brother and spouse are going through patient belongings at home to find insurance information. Mindi Slicker [facility] [phone]

## 2019-04-01 NOTE — Progress Notes (Signed)
  Speech Language Pathology Treatment: Cognitive-Linquistic  Patient Details Name: Joe Perry MRN: [id number] DOB: Apr 11, 1951 Today's Date: 04/01/2019 Time: 8938-1017 SLP Time Calculation (min) (ACUTE ONLY): 32 min  Assessment / Plan / Recommendation Clinical Impression  Pt was seen for treatment and cooperative throughout the session. However, his processing speed and frequency of perseveration were notably increased compared to when he was last seen by this SLP on 03/26/19. Pt intially denied fatigue but subsequently indicated that he was tired. The impact of this on his performance is considered. He  demonstrated 60% accuracy with sentence completion increasing to 80% with phonemic cues. With simple reasoning he demonstrated 25% accuracy increasing to 50% with mod-max cues. He achieved 50% accuracy with 4-item immediate recall increasing to 100% accuracy when mod cues were given and 25% with recall of 5 items increasing to 100% when mod-max support was provided. SLP will continue to follow pt.    HPI HPI: 68 year old male admitted 03/10/2019 with left weakness, unsteady gait, right gaze, left hemianopsia. PMH: CVA, HTN, DM. CT negative. MRI Ischemic infarct of the right posterior cerebral artery, territory, roughly corresponding to the ischemic volume.      SLP Plan  Continue with current plan of care       Recommendations                   Oral Care Recommendations: Oral care BID Follow up Recommendations: Skilled Nursing facility SLP Visit Diagnosis: Cognitive communication deficit (R41.841);Aphasia (R47.01);Dysphagia, unspecified (R13.10) Plan: Continue with current plan of care       Angell Honse I. Hardin Negus, [place], [place] Office number [phone] Pager [place] 04/01/2019, 4:00 PM

## 2019-04-01 NOTE — Progress Notes (Signed)
Physical Therapy Treatment Patient Details Name: Joe Perry MRN: [id number] DOB: 05-Nov-1950 Today's Date: 04/01/2019    History of Present Illness 68 yo male s/p  R PCOM and R PCA occlusion s/p emergent thrombectomy on 7/6. Pt admitteed with L sided weakness, R gaze perference, L homonymous hemianopsia. 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    PT Comments    Patient continues to present with impaired mobility, vision, and cognition increasing risk for falls. Pt requires mod/max A for gait training with and without use of AD. Continue to progress as tolerated with anticipated d/c to SNF for further skilled PT services.     Follow Up Recommendations  SNF;Supervision/Assistance - 24 hour     Equipment Recommendations  None recommended by PT    Recommendations for Other Services       Precautions / Restrictions Precautions Precautions: Fall Precaution Comments: L hemianopsia; L inattention Restrictions Weight Bearing Restrictions: No    Mobility  Bed Mobility Overal bed mobility: Needs Assistance Bed Mobility: Supine to Sit     Supine to sit: Min guard Sit to supine: Min guard   General bed mobility comments: min guard for safety; pt attempting to sit up despit cues not to until bed rails were down  Transfers Overall transfer level: Needs assistance Equipment used: None;Rolling walker (2 wheeled) Transfers: Sit to/from Stand Sit to Stand: Mod assist         General transfer comment: cues for safe hand placement when using RW and assist to power up into standing and to gain balance in standing  Ambulation/Gait Ambulation/Gait assistance: Mod assist;Max assist Gait Distance (Feet): (120 ft X 2 trials ) Assistive device: 1 person hand held assist;Rolling walker (2 wheeled)(and assist at trunk with gait belt) Gait Pattern/deviations: Step-through pattern;Wide base of support;Decreased stride length;Decreased stance time - left;Decreased dorsiflexion - left;Drifts  right/left;Decreased step length - right;Staggering left Gait velocity: decreased   General Gait Details: initially ambulating with HHA +1 and assist around trunk and then with use of RW; pt requires assistance for balance with and without AD and for navigating environment as pt demonstrates L side neglect; multiple LOB with increased assistance to recover; attempted L visual tracking tasks while ambulating however proved unsafe with only +1 assist due to disruption to gait with attempts of L gaze   Stairs             Wheelchair Mobility    Modified Rankin (Stroke Patients Only) Modified Rankin (Stroke Patients Only) Pre-Morbid Rankin Score: No symptoms Modified Rankin: Moderately severe disability     Balance Overall balance assessment: Needs assistance Sitting-balance support: Feet supported;Single extremity supported Sitting balance-[name]: Poor Sitting balance - Comments: pt attempted to don socks sitting EOB however unable due to LOB posteriorly  Postural control: Posterior lean;Right lateral lean Standing balance support: During functional activity;Single extremity supported Standing balance-[name]: Poor                              Cognition Arousal/Alertness: Awake/alert Behavior During Therapy: Impulsive;Flat affect;Restless Overall Cognitive Status: Impaired/Different from baseline Area of Impairment: Following commands;Safety/judgement;Problem solving;Orientation;Attention                 Orientation Level: Time;Situation;Place Current Attention Level: Sustained   Following Commands: Follows one step commands with increased time;Follows one step commands inconsistently Safety/Judgement: Decreased awareness of safety;Decreased awareness of deficits   Problem Solving: Requires verbal cues;Requires tactile cues;Difficulty sequencing  General Comments: required max multimodal cuing for visual scanning      Exercises      General  Comments        Pertinent Vitals/Pain Pain Assessment: No/denies pain Faces Pain Scale: No hurt    Home Living                      Prior Function            PT Goals (current goals can now be found in the care plan section) Acute Rehab PT Goals Patient Stated Goal: none stated Progress towards PT goals: Progressing toward goals    Frequency    Min 3X/week      PT Plan Current plan remains appropriate    Co-evaluation              AM-PAC PT "6 Clicks" Mobility   Outcome Measure  Help needed turning from your back to your side while in a flat bed without using bedrails?: A Little Help needed moving from lying on your back to sitting on the side of a flat bed without using bedrails?: A Little Help needed moving to and from a bed to a chair (including a wheelchair)?: A Little Help needed standing up from a chair using your arms (e.g., wheelchair or bedside chair)?: A Little Help needed to walk in hospital room?: A Lot Help needed climbing 3-5 steps with a railing? : Total 6 Click Score: 15    End of Session Equipment Utilized During Treatment: Gait belt Activity Tolerance: Patient tolerated treatment well Patient left: with call bell/phone within reach;in bed;with bed alarm set Nurse Communication: Mobility status PT Visit Diagnosis: Muscle weakness (generalized) (M62.81);Hemiplegia and hemiparesis;Other abnormalities of gait and mobility (R26.89) Hemiplegia - Right/Left: Left Hemiplegia - dominant/non-dominant: Dominant Hemiplegia - caused by: Cerebral infarction     Time: 8546-2703 PT Time Calculation (min) (ACUTE ONLY): 27 min  Charges:  $Gait Training: 23-37 mins                     Earney Navy, PTA Acute Rehabilitation Services Pager: [phone] Office: [phone]     Darliss Cheney 04/01/2019, 3:48 PM

## 2019-04-01 NOTE — Progress Notes (Signed)
STROKE TEAM PROGRESS NOTE   Interval history:  Pt lying in bed, having lunch. Awake alert, left hemichorea movement much improved so far. Continue abilify.   Vitals:   03/31/19 2358 04/01/19 0457 04/01/19 1028 04/01/19 1215  BP: 99/82 130/87 116/76 128/77  Pulse: 95 97 91 92  Resp: 17 18 18 18   Temp: 97.8 F (36.6 C) 97.8 F (36.6 C) 97.6 F (36.4 C) 97.7 F (36.5 C)  TempSrc: Oral Oral Oral Axillary  SpO2: 99% 96% 98% 98%  Weight:      Height:        CBC:  Recent Labs  Lab 03/26/19 1503 03/29/19 0610  WBC 6.7 7.4  HGB 13.3 14.6  HCT 39.2 43.4  MCV 91.0 92.3  PLT 306 294    Basic Metabolic Panel:  Recent Labs  Lab 03/26/19 1503 03/29/19 0610  NA 140 137  K 4.2 4.0  CL 106 101  CO2 25 23  GLUCOSE 100* 116*  BUN 11 23  CREATININE 0.86 1.20  CALCIUM 9.9 9.8    PHYSICAL EXAM General - Well nourished, well developed, not in acute distress  Ophthalmologic - fundi not visualized due to noncooperation.  Cardiovascular - Regular rate and rhythm.  Mental Status -  Awake alert, orientated to self, place, but not orientated to age, time. Language exam showed slightly nonfluent  speech intermittent word finding difficulty.  Cranial Nerves II - XII - II - left hemianopia. III, IV, VI - Extraocular movements intact, however, attending more on the right due to left hemianopia. V - Facial sensation intact bilaterally. VII - Facial movement intact bilaterally. VIII - Hearing & vestibular intact bilaterally. X - Palate elevates symmetrically. XI - Chin turning & shoulder shrug intact bilaterally. XII - Tongue protrusion intact.  Motor Strength - [name] was normal in all extremities and pronator drift was absent.  Bulk was normal and fasciculations were absent.   Motor Tone - Muscle tone was assessed at the neck and appendages and was normal.  Reflexes - [name] were symmetrical in all extremities and he had no pathological  reflexes.  Sensory - Light touch, temperature/pinprick were assessed and were symmetrical.    Coordination - slightly impaired on the left. Hemichorea like movement improved than yesterday. Able to suppress by will but not lasting. Absent during sleep.   gait and Station - not tested   ASSESSMENT/PLAN [name] is a 68 y.o. 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 Old R frontal infarct.    CTA head & neck - R P1 occlusion. Small BA w/ high grade atheromatous narrowing. L ICA siphon stenosis.  CT perfusion 19 cc penumbra  Cerebral angio TICI 2b revascularization occluded R PCOM and P PCA. Underlying large vessel atherosclerosis   MRI Rt PCA infarct    2D Echo EF 60 to 65%.   Recommend 30-day OP monitoring to rule out A. fib   HIV neg  LDL 101   HgbA1c 9.8  P2Y12 90  SCDs for VTE prophylaxis  aspirin 81 mg daily and clopidogrel 75 mg daily prior to admission, put on aspirin 325 mg daily and clopidogrel 75 mg daily. However, developed itchiness, plavix was discontinued. Now on ASA 81 and Brilinta 90 bid. Continue on discharge.  Therapy recommendations:  SNF  Disposition:  Pending - SW following with family for MCD application  Hx stroke/TIA  11/2018 - presented for dizziness and unsteady, MRI showed right frontal and parietal small white matter infarcts. No LVO. Extensive SVD.  Put on DAPT.  Patient signed [facility]  01/2016 - admitted for gait disability.  MRI negative for acute infarct.  Old infarcts B BG, thalami, L cerebellum.   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  Hypertension  Home meds:  None listed  Stable  On Lotensin 10 mg . Long-term BP goal normotensive  Hyperlipidemia  Home meds:  No statin listed  LDL 101, goal < 70  On Lipitor 40  Continue statin on discharge  Diabetes type II, uncontrolled  Home meds:  glucophage 1000 bid, resumed 7/11  HgbA1c 9.8, goal < 7.0  Hyperglycemia much improved  CBGs  SSI  Close PCP follow-up  Dysphagia, resolved . Secondary to stroke . On D3 thin liquids  Other Stroke Risk Factors  Advanced age  Overweight, Body mass index is 29.3 kg/m., recommend weight loss, diet and exercise as appropriate    Hospital day # 22   [name], MD PhD Stroke Neurology 04/01/2019 3:04 PM   To contact Stroke Continuity provider, please refer to [email or web address] After hours, contact General Neurology

## 2019-04-02 ENCOUNTER — Inpatient Hospital Stay (HOSPITAL_COMMUNITY): Payer: Medicare Other

## 2019-04-02 LAB — GLUCOSE, CAPILLARY
Glucose-Capillary: 108 mg/dL — ABNORMAL HIGH (ref 70–99)
Glucose-Capillary: 150 mg/dL — ABNORMAL HIGH (ref 70–99)
Glucose-Capillary: 166 mg/dL — ABNORMAL HIGH (ref 70–99)
Glucose-Capillary: 114 mg/dL — ABNORMAL HIGH (ref 70–99)

## 2019-04-02 LAB — BASIC METABOLIC PANEL
Anion gap: 13 (ref 5–15)
BUN: 22 mg/dL (ref 8–23)
CO2: 23 mmol/L (ref 22–32)
Calcium: 9.6 mg/dL (ref 8.9–10.3)
Chloride: 101 mmol/L (ref 98–111)
Creatinine, Ser: 0.95 mg/dL (ref 0.61–1.24)
GFR calc Af Amer: >60 mL/min (ref >60)
GFR calc non Af Amer: >60 mL/min (ref >60)
Glucose, Bld: 132 mg/dL — ABNORMAL HIGH (ref 70–99)
Potassium: 3.9 mmol/L (ref 3.5–5.1)
Sodium: 137 mmol/L (ref 135–145)

## 2019-04-02 LAB — HEPATIC FUNCTION PANEL
ALT: 43 U/L (ref 0–44)
AST: 37 U/L (ref 15–41)
Albumin: 3.8 g/dL (ref 3.5–5.0)
Alkaline Phosphatase: 62 U/L (ref 38–126)
Bilirubin, Direct: 0.2 mg/dL (ref 0.0–0.2)
Indirect Bilirubin: 1.1 mg/dL — ABNORMAL HIGH (ref 0.3–0.9)
Total Bilirubin: 1.3 mg/dL — ABNORMAL HIGH (ref 0.3–1.2)
Total Protein: 7.1 g/dL (ref 6.5–8.1)

## 2019-04-02 LAB — CBC
HCT: 40.8 % (ref 39.0–52.0)
Hemoglobin: 13.8 g/dL (ref 13.0–17.0)
MCH: 31 pg (ref 26.0–34.0)
MCHC: 33.8 g/dL (ref 30.0–36.0)
MCV: 91.7 fL (ref 80.0–100.0)
Platelets: 311 10*3/uL (ref 150–400)
RBC: 4.45 MIL/uL (ref 4.22–5.81)
RDW: 12.5 % (ref 11.5–15.5)
WBC: 5.5 10*3/uL (ref 4.0–10.5)
nRBC: 0 % (ref 0.0–0.2)

## 2019-04-02 MED ORDER — BISACODYL 10 MG RE SUPP
10.0000 mg | Freq: Every day | RECTAL | Status: DC | PRN
  Administered 2019-05-27 – 2019-06-01 (×2): 10 mg via RECTAL
  Filled 2019-04-02 (×2): qty 1

## 2019-04-02 MED ORDER — SENNOSIDES-DOCUSATE SODIUM 8.6-50 MG PO TABS
1.0000 | ORAL_TABLET | Freq: Every day | ORAL | Status: DC
  Administered 2019-04-02 – 2019-06-02 (×58): 1 via ORAL
  Filled 2019-04-02 (×60): qty 1

## 2019-04-02 MED ORDER — ONDANSETRON HCL 4 MG PO TABS
8.0000 mg | ORAL_TABLET | Freq: Three times a day (TID) | ORAL | Status: DC | PRN
  Administered 2019-04-02 – 2019-05-11 (×5): 8 mg via ORAL
  Filled 2019-04-02 (×7): qty 2

## 2019-04-02 MED ORDER — FLEET ENEMA 7-19 GM/118ML RE ENEM
1.0000 | ENEMA | Freq: Once | RECTAL | Status: AC
  Administered 2019-04-02: 1 via RECTAL
  Filled 2019-04-02: qty 1

## 2019-04-02 MED ORDER — BENAZEPRIL HCL 5 MG PO TABS
5.0000 mg | ORAL_TABLET | Freq: Every day | ORAL | Status: DC
  Administered 2019-04-03 – 2019-04-12 (×10): 5 mg via ORAL
  Filled 2019-04-02 (×10): qty 1

## 2019-04-02 NOTE — Progress Notes (Signed)
Occupational Therapy Treatment Patient Details Name: Joe Perry MRN: [id number] DOB: [birth year] Today's Date: 04/02/2019    History of present illness 68 yo male s/p  R PCOM and R PCA occlusion s/p emergent thrombectomy on 7/6. Pt admitteed with L sided weakness, R gaze perference, L homonymous hemianopsia. 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   OT comments  Pt not able to specify that he was not feeling well. Pt's food on R side and pt using L side more today with scanning and using LUE for self feeding. Pt vomiting episode after 1 bite of food chewing for 3 mins with motivation to spit food out. RN aware. Pt participated in bathing, dressing and grooming with set-upA. Pt MD aware too. Pt would greatly benefit from continued OT skilled services for ADL, mobility and safety in SNF. OT following acutely.    Follow Up Recommendations  SNF;Supervision/Assistance - 24 hour    Equipment Recommendations       Recommendations for Other Services      Precautions / Restrictions Precautions Precautions: Fall Precaution Comments: L hemianopsia; L inattention Restrictions Weight Bearing Restrictions: No       Mobility Bed Mobility Overal bed mobility: Needs Assistance Bed Mobility: Supine to Sit     Supine to sit: Min guard        Transfers Overall transfer level: Needs assistance Equipment used: None;Rolling walker (2 wheeled)   Sit to Stand: Min assist         General transfer comment: cues for safe hand placement when using RW and assist to power up into standing and to gain balance in standing    Balance Overall balance assessment: Needs assistance Sitting-balance support: Feet supported;Single extremity supported Sitting balance-[name]: Poor     Standing balance support: During functional activity;Single extremity supported Standing balance-[name]: Poor Standing balance comment: minA to modA for posterior lean                            ADL either performed or assessed with clinical judgement   ADL Overall ADL's : Needs assistance/impaired Eating/Feeding: Supervision/ safety;Set up Eating/Feeding Details (indicate cue type and reason): to hold utensils in hand and bring to mouth. pt chewing on food >3 mins when asked to spit it out, pt vomitted all over his tray and began coughing. Pt encouraged to continued to spit out what was in mouth. Pt had only 1 bite of food under supervisionA. RN came in and OT cleaned pt up with towels and pt bathing UB.  Grooming: Wash/dry hands;Wash/dry face;Oral care;Min guard;Sitting Grooming Details (indicate cue type and reason): Pt appeared unsteady today and required to be seated for task. Pt requring multimodal cues to spit out water after rinsing with it.  Upper Body Bathing: Set up;Sitting Upper Body Bathing Details (indicate cue type and reason): Pt unable to specify that he was nauseous. before vomitting.                         Functional mobility during ADLs: Minimal assistance;Moderate assistance;Rolling walker;Cueing for safety;Cueing for sequencing General ADL Comments: min cuuing to sequence self care and to scan to the L to obtain needed items     Vision   Vision Assessment?: Vision impaired- to be further tested in functional context Eye Alignment: Within Functional Limits   Perception     Praxis      Cognition Arousal/Alertness: Awake/alert Behavior  During Therapy: Flat affect Overall Cognitive Status: Impaired/Different from baseline Area of Impairment: Following commands;Safety/judgement;Problem solving;Orientation;Attention                 Orientation Level: Time;Situation;Place Current Attention Level: Sustained   Following Commands: Follows one step commands with increased time;Follows one step commands inconsistently Safety/Judgement: Decreased awareness of safety;Decreased awareness of deficits   Problem Solving: Requires verbal  cues;Requires tactile cues;Difficulty sequencing General Comments: required max multimodal cueing for visual scanning        Exercises     Shoulder Instructions       General Comments Pt awaiting for lunch to come- his breakfast tray appeared untouched.    Pertinent Vitals/ Pain       Pain Assessment: No/denies pain  Home Living                                          Prior Functioning/Environment              Frequency  Min 2X/week        Progress Toward Goals  OT Goals(current goals can now be found in the care plan section)  Progress towards OT goals: Progressing toward goals  Acute Rehab OT Goals Patient Stated Goal: none stated OT Goal Formulation: Patient unable to participate in goal setting Time For Goal Achievement: 04/16/19 Potential to Achieve Goals: Good ADL Goals Pt Will Perform Grooming: with modified independence;standing Pt Will Perform Lower Body Dressing: with min guard assist;sit to/from stand Pt Will Transfer to Toilet: with supervision;ambulating;grab bars;regular height toilet Pt Will Perform Toileting - Clothing Manipulation and hygiene: with supervision;sit to/from stand Additional ADL Goal #1: Pt will perform OOB ADL with modified independence exhibiting fair balance.  Plan Discharge plan remains appropriate    Co-evaluation                 AM-PAC OT "6 Clicks" Daily Activity     Outcome Measure   Help from another person eating meals?: A Little Help from another person taking care of personal grooming?: A Little Help from another person toileting, which includes using toliet, bedpan, or urinal?: A Lot Help from another person bathing (including washing, rinsing, drying)?: A Little Help from another person to put on and taking off regular upper body clothing?: A Little Help from another person to put on and taking off regular lower body clothing?: A Little 6 Click Score: 17    End of Session Equipment  Utilized During Treatment: Gait belt;Rolling walker  OT Visit Diagnosis: Unsteadiness on feet (R26.81);Muscle weakness (generalized) (M62.81)   Activity Tolerance Patient tolerated treatment well   Patient Left in chair;with call bell/phone within reach;with chair alarm set   Nurse Communication Mobility status        Time: 1245-1315 OT Time Calculation (min): 30 min  Charges: OT General Charges $OT Visit: 1 Visit OT Treatments $Self Care/Home Management : 23-37 mins $Neuromuscular Re-education: 8-22 mins  Joe Perry Acute Rehabilitation Services Pager: [phone] Office: [phone]    Joe Perry 04/02/2019, 4:18 PM

## 2019-04-02 NOTE — Progress Notes (Signed)
  Speech Language Pathology Treatment: Dysphagia  Patient Details Name: Joe Perry MRN: [id number] DOB: [birth year] Today's Date: 04/02/2019 Time: 0931-1216 SLP Time Calculation (min) (ACUTE ONLY): 12 min  Assessment / Plan / Recommendation Clinical Impression  Pt intermittently drifting off to sleepy, requiring prompts to awaken/participate.  He demonstrated oral holding, particularly with liquids, retaining them in his mouth for as long as thirty seconds before swallowing.  Required verbal cues to follow-through with swallow response.  RN reports this behavior is consistent with recent function.  Lung sounds are clear; pt is afebrile.  There were no concerns for aspiration, but supervision remains necessary with meals in order to cue him along the way due to his inattention.  SLP continues to follow for cognitive-communication and swallowing.    HPI HPI: 68 year old male admitted 03/10/2019 with left weakness, unsteady gait, right gaze, left hemianopsia. PMH: CVA, HTN, DM. CT negative. MRI Ischemic infarct of the right posterior cerebral artery, territory, roughly corresponding to the ischemic volume.      SLP Plan  Continue with current plan of care       Recommendations  Diet recommendations: Dysphagia 3 (mechanical soft) Liquids provided via: Cup;Straw Medication Administration: Crushed with puree Supervision: Staff to assist with self feeding;Full supervision/cueing for compensatory strategies Compensations: Slow rate;Small sips/bites;Minimize environmental distractions;Lingual sweep for clearance of pocketing;Follow solids with liquid Postural Changes and/or Swallow Maneuvers: Seated upright 90 degrees;Upright 30-60 min after meal                Oral Care Recommendations: Oral care BID Follow up Recommendations: Skilled Nursing facility SLP Visit Diagnosis: Dysphagia, unspecified (R13.10) Plan: Continue with current plan of care       [place].  Joe Perry, [place] CCC/SLP Acute Rehabilitation Services Office number [phone] Pager [phone]  Joe Perry 04/02/2019, 11:51 AM

## 2019-04-02 NOTE — Progress Notes (Addendum)
STROKE TEAM PROGRESS NOTE   Interval history:  Pt sitting in bed, working with PT in room. He still has left hemianopia / neglect. Left UE and LE hemichorea improved.   Vitals:   04/01/19 2337 04/02/19 0502 04/02/19 0836 04/02/19 1135  BP: 114/80 (!) 152/105 120/83 (!) 101/59  Pulse: 92 98 88 93  Resp: 19 17 20 16   Temp: (!) 97.3 F (36.3 C) 98.1 F (36.7 C) (!) 97.2 F (36.2 C) 97.6 F (36.4 C)  TempSrc: Oral Oral  Oral  SpO2: 100% 100% 98% 100%  Weight:      Height:        CBC:  Recent Labs  Lab 03/29/19 0610 04/02/19 0547  WBC 7.4 5.5  HGB 14.6 13.8  HCT 43.4 40.8  MCV 92.3 91.7  PLT 360 010    Basic Metabolic Panel:  Recent Labs  Lab 03/29/19 0610 04/02/19 0547  NA 137 137  K 4.0 3.9  CL 101 101  CO2 23 23  GLUCOSE 116* 132*  BUN 23 22  CREATININE 1.20 0.95  CALCIUM 9.8 9.6    PHYSICAL EXAM General - Well nourished, well developed, not in acute distress  Ophthalmologic - fundi not visualized due to noncooperation.  Cardiovascular - Regular rate and rhythm.  Mental Status -  Awake alert, orientated to self, but not orientated to age, time or place. Language exam showed paucity of speech, nonfluent  speech with intermittent word finding difficulty.  Cranial Nerves II - XII - II - left hemianopia. III, IV, VI - Extraocular movements intact V - Facial sensation intact bilaterally. VII - Facial movement intact bilaterally. VIII - Hearing & vestibular intact bilaterally. X - Palate elevates symmetrically. XI - Chin turning & shoulder shrug intact bilaterally. XII - Tongue protrusion intact.  Motor Strength - [name] was normal in all extremities and pronator drift was absent.  Bulk was normal and fasciculations were absent.   Motor Tone - Muscle tone was assessed at the neck and appendages and was normal.  Reflexes - [name] were symmetrical in all extremities and he had no pathological reflexes.  Sensory - Light  touch, temperature/pinprick were assessed and were symmetrical.    Coordination - slightly impaired on the left. Hemichorea like movement improved. Able to suppress by will but not lasting. Absent during sleep.   gait and Station - not tested   ASSESSMENT/PLAN [name] is a 68 y.o. 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 Old R frontal infarct.    CTA head & neck - R P1 occlusion. Small BA w/ high grade atheromatous narrowing. L ICA siphon stenosis.  CT perfusion 19 cc penumbra  Cerebral angio TICI 2b revascularization occluded R PCOM and P PCA. Underlying large vessel atherosclerosis   MRI Rt PCA infarct    2D Echo EF 60 to 65%.   Recommend 30-day OP monitoring to rule out A. fib   HIV neg  LDL 101   HgbA1c 9.8  P2Y12 90  SCDs for VTE prophylaxis  aspirin 81 mg daily and clopidogrel 75 mg daily prior to admission, put on aspirin 325 mg daily and clopidogrel 75 mg daily. However, developed itchiness, plavix was discontinued. Now on ASA 81 and Brilinta 90 bid. Continue on  discharge.  Therapy recommendations:  SNF  Disposition:  Pending - SW following with family for MCD application  Hx stroke/TIA  11/2018 - presented for dizziness and unsteady, MRI showed right frontal and parietal small white matter infarcts. No LVO. Extensive SVD.  Put on DAPT.  Patient signed [facility]  01/2016 - admitted for gait disability.  MRI negative for acute infarct.  Old infarcts B BG, thalami, L cerebellum.   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  Hypertension  Home meds:   None listed  Stable on the low end  decrease Lotensin to 5mg  daily . Long-term BP goal normotensive  Hyperlipidemia  Home meds:  No statin listed  LDL 101, goal < 70  AST and ALT normalized   continue Lipitor 40  Continue statin on discharge  Diabetes type II, uncontrolled  Home meds:  glucophage 1000 bid  HgbA1c 9.8, goal < 7.0  Hyperglycemia much improved  On metformin 1000mg  bid from 7/11  CBGs  SSI  Close PCP follow-up  Dysphagia, resolved . Secondary to stroke . On D3 thin liquids  Other Stroke Risk Factors  Advanced age  Overweight, Body mass index is 29.3 kg/m., recommend weight loss, diet and exercise as appropriate   No driving due to hemianopia  Hospital day # 23  [name], MD PhD Stroke Neurology 04/02/2019 12:14 PM   To contact Stroke Continuity provider, please refer to [email or web address] After hours, contact General Neurology

## 2019-04-02 NOTE — Progress Notes (Signed)
RN was feeding pt and he started coughing and trying to vomit but pt kept on trying to chew and swallow; pt educated and instructed pt to spit out but was having difficulty comprehending with command. Pt vomited his food; and RN noted possible aspiration from pt. MD notified and new orders received. Will continue to closely monitor pt. Delia Heady RN

## 2019-04-02 NOTE — Progress Notes (Signed)
Occupational Therapy Treatment Patient Details Name: Joe Perry MRN: [id number] DOB: May 17, 1951 Today's Date: 04/02/2019    History of present illness 68 yo male s/p  R PCOM and R PCA occlusion s/p emergent thrombectomy on 7/6. Pt admitteed with L sided weakness, R gaze perference, L homonymous hemianopsia. 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   OT comments  Pt stating "okay" a lot this session. Pt appears to be progressing. MinA for sit to stand and use of RW for stability. Pt requiring cues to make it to sink with recliner to follow. Pt performing light grooming in sitting today as pt too unsteady in standing with posterior lean to accomplish tasks safely. Pt would greatly benefit from continued OT skilled services for ADL, mobility and safety in SNF setting. OT following acutely.    Follow Up Recommendations  SNF;Supervision/Assistance - 24 hour    Equipment Recommendations       Recommendations for Other Services      Precautions / Restrictions Precautions Precautions: Fall Precaution Comments: L hemianopsia; L inattention Restrictions Weight Bearing Restrictions: No       Mobility Bed Mobility Overal bed mobility: Needs Assistance Bed Mobility: Supine to Sit     Supine to sit: Min guard        Transfers Overall transfer level: Needs assistance Equipment used: None;Rolling walker (2 wheeled)   Sit to Stand: Min assist         General transfer comment: cues for safe hand placement when using RW and assist to power up into standing and to gain balance in standing    Balance Overall balance assessment: Needs assistance Sitting-balance support: Feet supported;Single extremity supported Sitting balance-[name]: Poor     Standing balance support: During functional activity;Single extremity supported Standing balance-[name]: Poor Standing balance comment: minA to modA for posterior lean                           ADL either  performed or assessed with clinical judgement   ADL Overall ADL's : Needs assistance/impaired     Grooming: Wash/dry hands;Wash/dry face;Oral care;Min guard;Sitting Grooming Details (indicate cue type and reason): Pt appeared unsteady today and required to be seated for task. Pt requring multimodal cues to spit out water after rinsing with it.                              Functional mobility during ADLs: Minimal assistance;Moderate assistance;Rolling walker;Cueing for safety;Cueing for sequencing General ADL Comments: min cuing to sequence self care and to scan to the L to obtain needed items     Vision   Vision Assessment?: Vision impaired- to be further tested in functional context Eye Alignment: Within Functional Limits   Perception     Praxis      Cognition Arousal/Alertness: Awake/alert Behavior During Therapy: Flat affect Overall Cognitive Status: Impaired/Different from baseline Area of Impairment: Following commands;Safety/judgement;Problem solving;Orientation;Attention                 Orientation Level: Time;Situation;Place Current Attention Level: Sustained   Following Commands: Follows one step commands with increased time;Follows one step commands inconsistently Safety/Judgement: Decreased awareness of safety;Decreased awareness of deficits   Problem Solving: Requires verbal cues;Requires tactile cues;Difficulty sequencing General Comments: required max multimodal cueing for visual scanning        Exercises     Shoulder Instructions  General Comments Pt awaiting for lunch to come- his breakfast tray appeared untouched.    Pertinent Vitals/ Pain       Pain Assessment: No/denies pain  Home Living                                          Prior Functioning/Environment              Frequency  Min 2X/week        Progress Toward Goals  OT Goals(current goals can now be found in the care plan section)   Progress towards OT goals: Progressing toward goals  Acute Rehab OT Goals Patient Stated Goal: none stated OT Goal Formulation: Patient unable to participate in goal setting Time For Goal Achievement: 04/16/19 Potential to Achieve Goals: Good ADL Goals Pt Will Perform Grooming: with modified independence;standing Pt Will Perform Lower Body Dressing: with min guard assist;sit to/from stand Pt Will Transfer to Toilet: with supervision;ambulating;grab bars;regular height toilet Pt Will Perform Toileting - Clothing Manipulation and hygiene: with supervision;sit to/from stand Additional ADL Goal #1: Pt will perform OOB ADL with modified independence exhibiting fair balance.  Plan Discharge plan remains appropriate    Co-evaluation                 AM-PAC OT "6 Clicks" Daily Activity     Outcome Measure   Help from another person eating meals?: A Little Help from another person taking care of personal grooming?: A Little Help from another person toileting, which includes using toliet, bedpan, or urinal?: A Lot Help from another person bathing (including washing, rinsing, drying)?: A Little Help from another person to put on and taking off regular upper body clothing?: A Little Help from another person to put on and taking off regular lower body clothing?: A Little 6 Click Score: 17    End of Session Equipment Utilized During Treatment: Gait belt;Rolling walker  OT Visit Diagnosis: Unsteadiness on feet (R26.81);Muscle weakness (generalized) (M62.81)   Activity Tolerance Patient tolerated treatment well   Patient Left in chair;with call bell/phone within reach;with chair alarm set   Nurse Communication Mobility status        Time: 7824-2353 OT Time Calculation (min): 28 min  Charges: OT General Charges $OT Visit: 1 Visit OT Treatments $Self Care/Home Management : 8-22 mins $Neuromuscular Re-education: 8-22 mins  Darryl Nestle) Marsa Aris OTR/L Acute Rehabilitation  Services Pager: [phone] Office: [phone]    Jenene Slicker Shahida Schnackenberg 04/02/2019, 4:11 PM

## 2019-04-03 LAB — GLUCOSE, CAPILLARY
Glucose-Capillary: 98 mg/dL (ref 70–99)
Glucose-Capillary: 152 mg/dL — ABNORMAL HIGH (ref 70–99)
Glucose-Capillary: 156 mg/dL — ABNORMAL HIGH (ref 70–99)
Glucose-Capillary: 139 mg/dL — ABNORMAL HIGH (ref 70–99)

## 2019-04-03 MED ORDER — ARIPIPRAZOLE 10 MG PO TABS
10.0000 mg | ORAL_TABLET | Freq: Every day | ORAL | Status: DC
  Administered 2019-04-04: 10 mg via ORAL
  Filled 2019-04-03: qty 1

## 2019-04-03 NOTE — Plan of Care (Signed)
Progressing

## 2019-04-03 NOTE — Progress Notes (Signed)
Physical Therapy Treatment Patient Details Name: Joe Perry MRN: [id number] DOB: Apr 30, 1951 Today's Date: 04/03/2019    History of Present Illness 68 yo male s/p  R PCOM and R PCA occlusion s/p emergent thrombectomy on 7/6. Pt admitteed with L sided weakness, R gaze perference, L homonymous hemianopsia. 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    PT Comments    Patient seen for mobility progression. Pt is drowsy but agreeable to participate in therapy. Pt requires mod/max A +2 for gait training this session due to increased L lateral lean and instability. Pt continues to present with L side inattention and requires max cues for visual scanning of environment. Continue to progress as tolerated with anticipated d/c to SNF for further skilled PT services.     Follow Up Recommendations  SNF;Supervision/Assistance - 24 hour     Equipment Recommendations  None recommended by PT    Recommendations for Other Services       Precautions / Restrictions Precautions Precautions: Fall Precaution Comments: L hemianopsia; L inattention Restrictions Weight Bearing Restrictions: No    Mobility  Bed Mobility Overal bed mobility: Needs Assistance Bed Mobility: Supine to Sit;Sit to Supine     Supine to sit: Min assist Sit to supine: Min guard   General bed mobility comments: assist to elevate trunk into sitting and to maintain sitting balance   Transfers Overall transfer level: Needs assistance Equipment used: 2 person hand held assist Transfers: Sit to/from Stand Sit to Stand: Mod assist;+2 safety/equipment         General transfer comment: assist to power up and to gain balance in standing   Ambulation/Gait Ambulation/Gait assistance: Mod assist;+2 physical assistance;+2 safety/equipment Gait Distance (Feet): 120 Feet Assistive device: 2 person hand held assist(assist at trunk with gait belt) Gait Pattern/deviations: Step-through pattern;Step-to pattern;Decreased step length -  left;Decreased stride length;Decreased dorsiflexion - left;Decreased weight shift to right;Trunk flexed(heavy L lateral lean)     General Gait Details: assist required for balance and weight shifting with pt leaning on therapist on L side; cues for upright posture and visual scanning of environment; decreased bilat step lengths and limited L foot clearance at times    Stairs             Wheelchair Mobility    Modified Rankin (Stroke Patients Only)       Balance Overall balance assessment: Needs assistance Sitting-balance support: Feet supported;Single extremity supported Sitting balance-[name]: Poor   Postural control: Posterior lean Standing balance support: During functional activity;Bilateral upper extremity supported Standing balance-[name]: Poor                              Cognition Arousal/Alertness: Awake/alert(drowsy) Behavior During Therapy: Flat affect Overall Cognitive Status: Impaired/Different from baseline Area of Impairment: Following commands;Safety/judgement;Problem solving;Orientation;Attention                 Orientation Level: Time;Situation(states he is in the hospital) Current Attention Level: Sustained   Following Commands: Follows one step commands with increased time;Follows one step commands inconsistently Safety/Judgement: Decreased awareness of safety;Decreased awareness of deficits   Problem Solving: Requires verbal cues;Requires tactile cues;Difficulty sequencing;Slow processing General Comments: very drowsy this session and not nearly as restless as previous sessions; pt did not attempt to answer other orientation questions but states hes in the hospital; when asked if he wants to lay back down he stated "I want them to discharge me"  Exercises      General Comments        Pertinent Vitals/Pain Pain Assessment: No/denies pain    Home Living                      Prior Function             PT Goals (current goals can now be found in the care plan section) Acute Rehab PT Goals Patient Stated Goal: none stated Progress towards PT goals: Progressing toward goals    Frequency    Min 3X/week      PT Plan Current plan remains appropriate    Co-evaluation              AM-PAC PT "6 Clicks" Mobility   Outcome Measure  Help needed turning from your back to your side while in a flat bed without using bedrails?: A Little Help needed moving from lying on your back to sitting on the side of a flat bed without using bedrails?: A Little Help needed moving to and from a bed to a chair (including a wheelchair)?: A Lot Help needed standing up from a chair using your arms (e.g., wheelchair or bedside chair)?: A Lot Help needed to walk in hospital room?: A Lot Help needed climbing 3-5 steps with a railing? : Total 6 Click Score: 13    End of Session Equipment Utilized During Treatment: Gait belt Activity Tolerance: Patient tolerated treatment well Patient left: in bed;with call bell/phone within reach;with bed alarm set Nurse Communication: Mobility status PT Visit Diagnosis: Muscle weakness (generalized) (M62.81);Hemiplegia and hemiparesis;Other abnormalities of gait and mobility (R26.89) Hemiplegia - Right/Left: Left Hemiplegia - dominant/non-dominant: Dominant Hemiplegia - caused by: Cerebral infarction     Time: 1655-3748 PT Time Calculation (min) (ACUTE ONLY): 20 min  Charges:  $Gait Training: 8-22 mins                     Earney Navy, PTA Acute Rehabilitation Services Pager: [phone] Office: [phone]     Darliss Cheney 04/03/2019, 4:25 PM

## 2019-04-03 NOTE — TOC Progression Note (Signed)
Transition of Care [facility]) - Progression Note    Patient Details  Name: Joe Perry MRN: [id number] Date of Birth: 11/29/1950  Transition of Care [facility]) CM/SW [place],  Phone Number: 04/03/2019, 8:33 PM  Clinical Narrative:   CSW met with patient's brother, Joe Perry, and wife, Joe Perry, for them to meet with the patient and bring some of the financial paperwork that they were able to locate within the house to try to discuss with him. They found a bank statement to bring in, but were unable to locate anything else. CSW to scan and provide to financial counselor to see if it assists with Medicaid application.  Patient remains on difficult to place list in the interim. Wife and brother were appreciative of the visit to see the patient.    Expected Discharge Plan: [place] Barriers to Discharge: Continued Medical Work up, SNF Pending Medicaid  Expected Discharge Plan and Services Expected Discharge Plan: [place] In-house Referral: Clinical Social Work Discharge Planning Services: NA Post Acute Care Choice: [place] Living arrangements for the past 2 months: Single Family Home                 DME Arranged: N/A DME Agency: NA     Representative spoke with at DME Agency: na HH Arranged: NA [place] Agency: NA         Social Determinants of Health (SDOH) Interventions    Readmission Risk Interventions No flowsheet data found.

## 2019-04-03 NOTE — Progress Notes (Signed)
  Speech Language Pathology Treatment: Cognitive-Linquistic  Patient Details Name: Joe Perry MRN: [id number] DOB: 1950-09-28 Today's Date: 04/03/2019 Time: 5681-2751 SLP Time Calculation (min) (ACUTE ONLY): 13 min  Assessment / Plan / Recommendation Clinical Impression  Pt was seen for treatment and was cooperative throughout the session but demonstrated difficulty maintaining alertness for prolonged periods and the session was ultimately abbreviated for this reason. Pt required mod-max support for problem solving related to safety. He demonstrated 100% accuracy with sentence formualtion related to single-action photos but required mod support. He was oriented to person and place but required cues for orientation to time. SLP will continue to follow pt.    HPI HPI: 68 year old male admitted 03/10/2019 with left weakness, unsteady gait, right gaze, left hemianopsia. PMH: CVA, HTN, DM. CT negative. MRI Ischemic infarct of the right posterior cerebral artery, territory, roughly corresponding to the ischemic volume.      SLP Plan  Continue with current plan of care       Recommendations                   Oral Care Recommendations: Oral care BID Follow up Recommendations: Skilled Nursing facility SLP Visit Diagnosis: Cognitive communication deficit (R41.841);Aphasia (R47.01);Dysphagia, unspecified (R13.10) Plan: Continue with current plan of care       Joe Perry I. Hardin Negus, [place], [place] Office number [phone] Pager [place] 04/03/2019, 11:52 AM

## 2019-04-03 NOTE — Progress Notes (Signed)
STROKE TEAM PROGRESS NOTE   Interval history:  Pt lying in bed, nurse tech is feeding him lunch. Eating slow but no chocking or vomiting. Still not orientated, but left hemichorea is improving.   Vitals:   04/02/19 1943 04/02/19 2349 04/03/19 0343 04/03/19 0900  BP: 108/64 118/77 (!) 147/92 (!) 133/113  Pulse: 83 90 93 92  Resp: 16 16 18 18   Temp: 97.7 F (36.5 C) 98.4 F (36.9 C) 98.4 F (36.9 C) 97.9 F (36.6 C)  TempSrc: Oral Oral Oral Axillary  SpO2: 100% 97% 100% 99%  Weight:      Height:        CBC:  Recent Labs  Lab 03/29/19 0610 04/02/19 0547  WBC 7.4 5.5  HGB 14.6 13.8  HCT 43.4 40.8  MCV 92.3 91.7  PLT 360 233    Basic Metabolic Panel:  Recent Labs  Lab 03/29/19 0610 04/02/19 0547  NA 137 137  K 4.0 3.9  CL 101 101  CO2 23 23  GLUCOSE 116* 132*  BUN 23 22  CREATININE 1.20 0.95  CALCIUM 9.8 9.6   Dg Abd 1 View  Result Date: 04/02/2019 CLINICAL DATA:  Vomiting.  History of stroke. EXAM: ABDOMEN - 1 VIEW COMPARISON:  None. FINDINGS: There is mild gaseous distention of presumed descending colon and two loops of small bowel within the left mid hemiabdomen without definitive evidence of enteric obstruction. Unremarkable colonic stool burden. No pneumoperitoneum, pneumatosis or portal venous gas. No definitive abnormal intra-abdominal calcifications. Limited visualization of the lower thorax demonstrates mild elevation of the right hemidiaphragm. No acute osseous abnormalities. IMPRESSION: Nonobstructive bowel gas pattern.  Unremarkable colonic stool Electronically Signed   By: Sandi Mariscal M.D.   On: 04/02/2019 19:00   Dg Chest Port 1 View  Result Date: 04/02/2019 CLINICAL DATA:  Vomiting. History of stroke. Evaluate for aspiration. EXAM: PORTABLE CHEST 1 VIEW COMPARISON:  01/31/2016 FINDINGS: Grossly unchanged cardiac silhouette and mediastinal contours with atherosclerotic plaque within the thoracic aorta. No focal airspace opacities. No pleural effusion or  pneumothorax. No evidence of edema. No acute osseous abnormalities. IMPRESSION: No acute cardiopulmonary disease. Specifically, no discrete focal airspace opacities to suggest aspiration Electronically Signed   By: Sandi Mariscal M.D.   On: 04/02/2019 19:01     PHYSICAL EXAM General - Well nourished, well developed, not in acute distress  Ophthalmologic - fundi not visualized due to noncooperation.  Cardiovascular - Regular rate and rhythm.  Mental Status -  Awake alert, orientated to his name, but not orientated to age, time or place. Language exam showed paucity of speech, nonfluent  speech with word finding difficulty. Able to follow commands.  Cranial Nerves II - XII - II - left hemianopia. III, IV, VI - Extraocular movements intact V - Facial sensation intact bilaterally. VII - Facial movement intact bilaterally. VIII - Hearing & vestibular intact bilaterally. X - Palate elevates symmetrically. XI - Chin turning & shoulder shrug intact bilaterally. XII - Tongue protrusion intact.  Motor Strength - [name] was normal in all extremities and pronator drift was absent.  Bulk was normal and fasciculations were absent.   Motor Tone - Muscle tone was assessed at the neck and appendages and was normal.  Reflexes - [name] were symmetrical in all extremities and he had no pathological reflexes.  Sensory - Light touch, temperature/pinprick were assessed and were symmetrical.    Coordination - slightly impaired on the left. Hemichorea like movement improving. Able to suppress by  will but not lasting. Absent during sleep.   gait and Station - not tested   ASSESSMENT/PLAN [name] is a 68 y.o. 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 Old R frontal  infarct.    CTA head & neck - R P1 occlusion. Small BA w/ high grade atheromatous narrowing. L ICA siphon stenosis.  CT perfusion 19 cc penumbra  Cerebral angio TICI 2b revascularization occluded R PCOM and P PCA. Underlying large vessel atherosclerosis   MRI Rt PCA infarct    2D Echo EF 60 to 65%.   Recommend 30-day OP monitoring to rule out A. fib   HIV neg  LDL 101   HgbA1c 9.8  P2Y12 90  SCDs for VTE prophylaxis  aspirin 81 mg daily and clopidogrel 75 mg daily prior to admission, put on aspirin 325 mg daily and clopidogrel 75 mg daily. However, developed itchiness, plavix was discontinued. Now on ASA 81 and Brilinta 90 bid. Continue on discharge.  Therapy recommendations:  SNF  Disposition:  Pending - SW following with family for MCD application  Hx stroke/TIA  11/2018 - presented for dizziness and unsteady, MRI showed right frontal and parietal small white matter infarcts. No LVO. Extensive SVD.  Put on DAPT.  Patient signed [facility]  01/2016 - admitted for gait disability.  MRI negative for acute infarct.  Old infarcts B BG, thalami, L cerebellum.   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  Hypertension  Home meds:  None listed  Stable on the low end  decrease Lotensin to 5mg  daily . Long-term BP goal normotensive  Hyperlipidemia  Home meds:  No statin listed  LDL 101, goal < 70  AST and ALT normalized   continue Lipitor 40  Continue statin on discharge  Diabetes type II, uncontrolled  Home meds:  glucophage 1000 bid  HgbA1c 9.8, goal < 7.0  Hyperglycemia much improved  On metformin 1000mg  bid from 7/11  CBGs  SSI  Close PCP follow-up  Dysphagia, resolved . Secondary to stroke . On D3 thin liquids . Cough with food lead to  vomiting. CXR ok.  Other Stroke Risk Factors  Advanced age  Overweight, Body mass index is 29.3 kg/m., recommend weight loss, diet and exercise as appropriate   No driving due to hemianopia  Other Active Problems  Constipation, abd xray ok, put on senokot, prn bisacodyl and fleets  Hospital day # 24  [name], MD PhD Stroke Neurology 04/03/2019 12:11 PM   To contact Stroke Continuity provider, please refer to [email or web address] After hours, contact General Neurology

## 2019-04-03 NOTE — Progress Notes (Signed)
SLP Cancellation Note  Patient Details Name: Joe Perry MRN: [id number] DOB: April 14, 1951   Cancelled treatment:       Reason Eval/Treat Not Completed: Fatigue/lethargy limiting ability to participate(Pt was approached for treatment but was unable to demonstrate/maintain an adequate level of alertness to participate in the session. SLP will re-attempt as able.)  Lenisha Lacap I. Hardin Negus, [place], [place] Office number [phone] Pager [place] 04/03/2019, 10:46 AM

## 2019-04-04 ENCOUNTER — Encounter (HOSPITAL_COMMUNITY): Payer: Self-pay | Admitting: *Deleted

## 2019-04-04 DIAGNOSIS — R1111 Vomiting without nausea: Secondary | ICD-10-CM

## 2019-04-04 LAB — GLUCOSE, CAPILLARY
Glucose-Capillary: 93 mg/dL (ref 70–99)
Glucose-Capillary: 154 mg/dL — ABNORMAL HIGH (ref 70–99)
Glucose-Capillary: 112 mg/dL — ABNORMAL HIGH (ref 70–99)
Glucose-Capillary: 143 mg/dL — ABNORMAL HIGH (ref 70–99)

## 2019-04-04 NOTE — Plan of Care (Signed)
Progressing

## 2019-04-04 NOTE — Progress Notes (Signed)
Physical Therapy Treatment Patient Details Name: Joe Perry MRN: [id number] DOB: 1950-11-29 Today's Date: 04/04/2019    History of Present Illness 68 yo male s/p  R PCOM and R PCA occlusion s/p emergent thrombectomy on 7/6. Pt admitteed with L sided weakness, R gaze perference, L homonymous hemianopsia. 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    PT Comments    Patient seen for mobility progression. Pt requires mod/max A +2 for functional transfer/gait training this session. Continue to progress as tolerated with anticipated d/c to SNF for further skilled PT services.     Follow Up Recommendations  SNF;Supervision/Assistance - 24 hour     Equipment Recommendations  None recommended by PT    Recommendations for Other Services       Precautions / Restrictions Precautions Precautions: Fall Precaution Comments: L hemianopsia; L inattention Restrictions Weight Bearing Restrictions: No    Mobility  Bed Mobility Overal bed mobility: Needs Assistance Bed Mobility: Supine to Sit     Supine to sit: Min assist     General bed mobility comments: assist to elevate trunk into sitting and to maintain sitting balance   Transfers Overall transfer level: Needs assistance Equipment used: 2 person hand held assist Transfers: Sit to/from Stand Sit to Stand: Mod assist;+2 safety/equipment Stand pivot transfers: Mod assist       General transfer comment: assist to power up and to gain balance in standing   Ambulation/Gait Ambulation/Gait assistance: Mod assist;Max assist;+2 physical assistance;+2 safety/equipment Gait Distance (Feet): 60 Feet Assistive device: 2 person hand held assist Gait Pattern/deviations: Step-through pattern;Decreased step length - left;Decreased weight shift to right;Decreased stride length(heavy L lateral bias) Gait velocity: decreased   General Gait Details: assist required for balance and weight shiting; pt leaning heavily on therapist to L side     Stairs             Wheelchair Mobility    Modified Rankin (Stroke Patients Only)       Balance Overall balance assessment: Needs assistance Sitting-balance support: Feet supported;Single extremity supported Sitting balance-[name]: Poor   Postural control: Posterior lean Standing balance support: During functional activity;Bilateral upper extremity supported Standing balance-[name]: Poor                              Cognition Arousal/Alertness: Awake/alert Behavior During Therapy: Flat affect Overall Cognitive Status: Impaired/Different from baseline Area of Impairment: Following commands;Safety/judgement;Problem solving;Orientation;Attention                 Orientation Level: Time;Situation Current Attention Level: Sustained Memory: Decreased recall of precautions;Decreased short-term memory Following Commands: Follows one step commands with increased time;Follows one step commands inconsistently Safety/Judgement: Decreased awareness of safety;Decreased awareness of deficits            Exercises      General Comments        Pertinent Vitals/Pain Pain Assessment: No/denies pain    Home Living                      Prior Function            PT Goals (current goals can now be found in the care plan section) Acute Rehab PT Goals Patient Stated Goal: none stated Progress towards PT goals: Progressing toward goals    Frequency    Min 3X/week      PT Plan Current plan remains appropriate    Co-evaluation  PT/OT/SLP Co-Evaluation/Treatment: Yes Reason for Co-Treatment: Complexity of the patient's impairments (multi-system involvement)   OT goals addressed during session: ADL's and self-care      AM-PAC PT "6 Clicks" Mobility   Outcome Measure  Help needed turning from your back to your side while in a flat bed without using bedrails?: A Little Help needed moving from lying on your back to sitting on  the side of a flat bed without using bedrails?: A Little Help needed moving to and from a bed to a chair (including a wheelchair)?: A Lot Help needed standing up from a chair using your arms (e.g., wheelchair or bedside chair)?: A Lot Help needed to walk in hospital room?: A Lot Help needed climbing 3-5 steps with a railing? : Total 6 Click Score: 13    End of Session Equipment Utilized During Treatment: Gait belt Activity Tolerance: Patient tolerated treatment well Patient left: in chair;with call bell/phone within reach;with chair alarm set Nurse Communication: Mobility status PT Visit Diagnosis: Muscle weakness (generalized) (M62.81);Hemiplegia and hemiparesis;Other abnormalities of gait and mobility (R26.89) Hemiplegia - Right/Left: Left Hemiplegia - dominant/non-dominant: Dominant Hemiplegia - caused by: Cerebral infarction     Time: 2919-1660 PT Time Calculation (min) (ACUTE ONLY): 27 min  Charges:  $Gait Training: 8-22 mins                     Earney Navy, PTA Acute Rehabilitation Services Pager: [phone] Office: [phone]     Darliss Cheney 04/04/2019, 5:20 PM

## 2019-04-04 NOTE — Progress Notes (Signed)
  Speech Language Pathology Treatment: Cognitive-Linquistic  Patient Details Name: Joe Perry MRN: [id number] DOB: 31-Mar-1951 Today's Date: 04/04/2019 Time: 3335-4562 SLP Time Calculation (min) (ACUTE ONLY): 16 min  Assessment / Plan / Recommendation Clinical Impression  Pt was seen for treatment and was cooperative but required frequent tactile stimulation to maintain alertness. The session was ultimately terminated due to his reduced alertness and him indicating that he was hungry and would like to eat. He was required max support for simple divergent naming tasks and confrontational naming. He was able to recall one 4-item set during an immediate recall task but required cues for all thereafter. Despite cues for the former and next months, he was insistent that this month is December. Mod-max support was needed during structured simple reasoning tasks. SLP will continue to follow pt.    HPI HPI: 68 year old male admitted 03/10/2019 with left weakness, unsteady gait, right gaze, left hemianopsia. PMH: CVA, HTN, DM. CT negative. MRI Ischemic infarct of the right posterior cerebral artery, territory, roughly corresponding to the ischemic volume.      SLP Plan  Continue with current plan of care       Recommendations                   Oral Care Recommendations: Oral care BID Follow up Recommendations: Skilled Nursing facility SLP Visit Diagnosis: Cognitive communication deficit (R41.841);Aphasia (R47.01);Dysphagia, unspecified (R13.10) Plan: Continue with current plan of care       Darletta Noblett I. Hardin Negus, [place], [place] Office number [phone] Pager [place] 04/04/2019, 12:22 PM

## 2019-04-04 NOTE — Progress Notes (Addendum)
STROKE TEAM PROGRESS NOTE   Interval history:  L hemichorea seems to be improving. Pt is sitting in chair per OT. Oriented to name. Continues showing signs of dysarthria and expressive aphasia. Developed vomiting episode on 7/29, improved yesterday but again vomited x 2. Pt no nausea or other complains. Will d/c abilify to observe.   Vitals:   04/04/19 0838 04/04/19 1146 04/04/19 1632 04/04/19 1637  BP: 127/70 112/81 (!) 88/68 95/76  Pulse: 85 87 84 93  Resp: 12 16 16    Temp: 98.4 F (36.9 C) 98 F (36.7 C) 97.8 F (36.6 C)   TempSrc: Oral Oral Oral   SpO2: 98% 99% 100% 93%  Weight:      Height:        CBC:  Recent Labs  Lab 03/29/19 0610 04/02/19 0547  WBC 7.4 5.5  HGB 14.6 13.8  HCT 43.4 40.8  MCV 92.3 91.7  PLT 360 355    Basic Metabolic Panel:  Recent Labs  Lab 03/29/19 0610 04/02/19 0547  NA 137 137  K 4.0 3.9  CL 101 101  CO2 23 23  GLUCOSE 116* 132*  BUN 23 22  CREATININE 1.20 0.95  CALCIUM 9.8 9.6   No results found.   PHYSICAL EXAM GEN: NAD, pleasant, cooperative CVS: RRR, no carotid bruit CHEST: No signs of resp distress, on room air ABD: Soft, NTTP  NEURO:  MENTAL STATUS: Awake, alert, oriented to name.  LANG/SPEECH: dysarthric, speech is not fluent, expressive aphasia with word finding difficulty and perseveration, able to follow commands.   CRANIAL NERVES:  II:  L hemianopia III, IV, VI: EOM intact, no gaze preference or deviation  V: normal  VII: no facial asymmetry  VIII: normal hearing to speech  MOTOR: normal both upper and lower extremities, no drift. L hemichorea persists but improving. Able to suppress. REFLEXES: 2/4 throughout, bilateral flexor planters  SENSORY: Normal to touch, temperature & pin prick in all extremiteis  COORD: L hemichorea/tremor   ASSESSMENT/PLAN [name] is a 68 y.o. 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   Stroke:   R large PCA infarct  s/p IR w/ TICI2b revascularization, secondary to large vessel disease    Code Stroke CT head No acute abnormality. Old R frontal infarct.    CTA head & neck - R P1 occlusion. Small BA w/ high grade atheromatous narrowing. L ICA siphon stenosis.  CT perfusion 19 cc penumbra  Cerebral angio TICI 2b revascularization occluded R PCOM and P PCA. Underlying large vessel atherosclerosis   MRI Rt PCA infarct    2D Echo EF 60 to 65%.   Recommend 30-day OP monitoring to rule out A. fib   HIV neg  LDL 101   HgbA1c 9.8  P2Y12 90  SCDs for VTE prophylaxis  aspirin 81 mg daily and clopidogrel 75 mg daily prior to admission, put on aspirin 325 mg daily and clopidogrel 75 mg daily. However, developed itchiness, plavix was discontinued. Now on ASA 81 and Brilinta 90 bid. Continue on discharge.  Therapy recommendations:  SNF  Disposition:  Pending - SW following with family for MCD application  Hx stroke/TIA  11/2018 - presented for dizziness and unsteady, MRI showed right frontal and parietal small white matter infarcts. No LVO. Extensive SVD.  Put on DAPT.  Patient signed [facility]  01/2016 - admitted for gait disability.  MRI negative for acute infarct.  Old infarcts B BG, thalami, L  cerebellum.   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 However, pt developed vomiting episodes x 2 days. Will d/c abilify and observe.  Hemichorea movement expected to improve over time.  Hypertension  Home meds:  None listed  Stable on the low end  Continue Lotensin to 5mg  daily . Long-term BP goal normotensive  Hyperlipidemia  Home meds:  No statin listed  LDL 101, goal < 70  AST and ALT normalized   continue Lipitor 40  Continue statin on discharge  Diabetes type II, uncontrolled  Home meds:  glucophage 1000 bid  HgbA1c  9.8, goal < 7.0  Hyperglycemia much improved  On metformin 1000mg  bid from 7/11  CBGs  SSI  Close PCP follow-up  Dysphagia, resolved . Secondary to stroke . On D3 thin liquids . Cough with food lead to vomiting. CXR ok.  Other Stroke Risk Factors  Advanced age  Overweight, Body mass index is 29.3 kg/m., recommend weight loss, diet and exercise as appropriate   No driving due to hemianopia  Other Active Problems  Constipation, abd xray ok, put on senokot, prn bisacodyl and fleets  Hospital day # 25   [name], MD PhD Stroke Neurology 04/04/2019 6:27 PM    To contact Stroke Continuity provider, please refer to [email or web address] After hours, contact General Neurology

## 2019-04-04 NOTE — Progress Notes (Signed)
Occupational Therapy Treatment Patient Details Name: Joe Perry MRN: [id number] DOB: 03/19/1951 Today's Date: 04/04/2019    History of present illness 68 yo male s/p  R PCOM and R PCA occlusion s/p emergent thrombectomy on 7/6. Pt admitteed with L sided weakness, R gaze perference, L homonymous hemianopsia. 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   OT comments  Pt performing mobility with handheld assist modA +2. Pt performing grooming at sink with modA. Pt using L hand more and attempting to place items on L side to attend to L side next. Pt transferring to [facility] with modA. Pt would greatly benefit from continued OT skilled services for ADL, mobility and safety. OT following acutely.    Follow Up Recommendations  SNF;Supervision/Assistance - 24 hour    Equipment Recommendations  Other (comment)(to be determined)    Recommendations for Other Services      Precautions / Restrictions Precautions Precautions: Fall Precaution Comments: L hemianopsia; L inattention Restrictions Weight Bearing Restrictions: No       Mobility Bed Mobility Overal bed mobility: Needs Assistance Bed Mobility: Supine to Sit;Sit to Supine     Supine to sit: Min assist     General bed mobility comments: assist to elevate trunk into sitting and to maintain sitting balance   Transfers Overall transfer level: Needs assistance Equipment used: 2 person hand held assist Transfers: Sit to/from Stand Sit to Stand: Mod assist;+2 safety/equipment Stand pivot transfers: Mod assist       General transfer comment: assist to power up and to gain balance in standing     Balance Overall balance assessment: Needs assistance Sitting-balance support: Feet supported;Single extremity supported Sitting balance-[name]: Poor   Postural control: Posterior lean Standing balance support: During functional activity;Bilateral upper extremity supported Standing balance-[name]: Poor                              ADL either performed or assessed with clinical judgement   ADL Overall ADL's : Needs assistance/impaired     Grooming: Wash/dry hands;Wash/dry face;Oral care;Min guard;Sitting Grooming Details (indicate cue type and reason): Pt appeared unsteady today and required to be supported on each side for task. Pt requring multimodal cues to spit out water after rinsing with it.                  Toilet Transfer: Minimal Patent examiner Details (indicate cue type and reason): maxA for hygiene         Functional mobility during ADLs: Moderate assistance;+2 for physical assistance;+2 for safety/equipment General ADL Comments: multiple multimodal cues for any functional task     Vision   Vision Assessment?: Vision impaired- to be further tested in functional context Eye Alignment: Within Functional Limits Ocular Range of Motion: Restricted on the left   Perception     Praxis      Cognition Arousal/Alertness: Awake/alert Behavior During Therapy: Flat affect Overall Cognitive Status: Impaired/Different from baseline Area of Impairment: Following commands;Safety/judgement;Problem solving;Orientation;Attention                 Orientation Level: Time;Situation Current Attention Level: Sustained Memory: Decreased recall of precautions;Decreased short-term memory Following Commands: Follows one step commands with increased time;Follows one step commands inconsistently Safety/Judgement: Decreased awareness of safety;Decreased awareness of deficits              Exercises     Shoulder Instructions       General Comments  Pertinent Vitals/ Pain       Pain Assessment: No/denies pain  Home Living                                          Prior Functioning/Environment              Frequency  Min 2X/week        Progress Toward Goals  OT Goals(current goals can now be found in the care plan section)   Progress towards OT goals: Progressing toward goals  Acute Rehab OT Goals Patient Stated Goal: none stated OT Goal Formulation: Patient unable to participate in goal setting Time For Goal Achievement: 04/16/19 Potential to Achieve Goals: Good ADL Goals Pt Will Perform Grooming: with modified independence;standing Pt Will Perform Lower Body Dressing: with min guard assist;sit to/from stand Pt Will Transfer to Toilet: with supervision;ambulating;grab bars;regular height toilet Pt Will Perform Toileting - Clothing Manipulation and hygiene: with supervision;sit to/from stand Additional ADL Goal #1: Pt will perform OOB ADL with modified independence exhibiting fair balance.  Plan Discharge plan remains appropriate    Co-evaluation    PT/OT/SLP Co-Evaluation/Treatment: Yes Reason for Co-Treatment: Complexity of the patient's impairments (multi-system involvement)   OT goals addressed during session: ADL's and self-care      AM-PAC OT "6 Clicks" Daily Activity     Outcome Measure   Help from another person eating meals?: A Little Help from another person taking care of personal grooming?: A Little Help from another person toileting, which includes using toliet, bedpan, or urinal?: A Lot Help from another person bathing (including washing, rinsing, drying)?: A Little Help from another person to put on and taking off regular upper body clothing?: A Little Help from another person to put on and taking off regular lower body clothing?: A Little 6 Click Score: 17    End of Session Equipment Utilized During Treatment: Gait belt;Rolling walker  OT Visit Diagnosis: Unsteadiness on feet (R26.81);Muscle weakness (generalized) (M62.81)   Activity Tolerance Patient tolerated treatment well   Patient Left in chair;with call bell/phone within reach;with chair alarm set   Nurse Communication Mobility status        Time: 5053-9767 OT Time Calculation (min): 27 min  Charges: OT General  Charges $OT Visit: 1 Visit OT Treatments $Self Care/Home Management : 8-22 mins  Darryl Nestle) Marsa Aris OTR/L Acute Rehabilitation Services Pager: [phone] Office: [phone]    Audie Pinto 04/04/2019, 4:52 PM

## 2019-04-04 NOTE — Progress Notes (Signed)
Pt vomited x2 this afternoon; Dr. Erlinda Hong on unit notified; will continue to closely monitor pt. Delia Heady RN

## 2019-04-05 LAB — CBC
HCT: 42.8 % (ref 39.0–52.0)
Hemoglobin: 14.4 g/dL (ref 13.0–17.0)
MCH: 31 pg (ref 26.0–34.0)
MCHC: 33.6 g/dL (ref 30.0–36.0)
MCV: 92 fL (ref 80.0–100.0)
Platelets: 310 10*3/uL (ref 150–400)
RBC: 4.65 MIL/uL (ref 4.22–5.81)
RDW: 12.5 % (ref 11.5–15.5)
WBC: 7.8 10*3/uL (ref 4.0–10.5)
nRBC: 0 % (ref 0.0–0.2)

## 2019-04-05 LAB — BASIC METABOLIC PANEL
Anion gap: 13 (ref 5–15)
BUN: 32 mg/dL — ABNORMAL HIGH (ref 8–23)
CO2: 22 mmol/L (ref 22–32)
Calcium: 9.8 mg/dL (ref 8.9–10.3)
Chloride: 103 mmol/L (ref 98–111)
Creatinine, Ser: 1 mg/dL (ref 0.61–1.24)
GFR calc Af Amer: >60 mL/min (ref >60)
GFR calc non Af Amer: >60 mL/min (ref >60)
Glucose, Bld: 113 mg/dL — ABNORMAL HIGH (ref 70–99)
Potassium: 4.4 mmol/L (ref 3.5–5.1)
Sodium: 138 mmol/L (ref 135–145)

## 2019-04-05 LAB — GLUCOSE, CAPILLARY
Glucose-Capillary: 90 mg/dL (ref 70–99)
Glucose-Capillary: 130 mg/dL — ABNORMAL HIGH (ref 70–99)
Glucose-Capillary: 122 mg/dL — ABNORMAL HIGH (ref 70–99)
Glucose-Capillary: 122 mg/dL — ABNORMAL HIGH (ref 70–99)

## 2019-04-05 NOTE — Progress Notes (Signed)
STROKE TEAM PROGRESS NOTE   Interval history:  No involuntary movements noted.. Pt is lying in bedr  . Continues to have dysarthria and expressive aphasia. . No new changes. BMP, CC and glucose are unremarkable today.  Vitals:   04/04/19 1637 04/04/19 2113 04/05/19 0005 04/05/19 0449  BP: 95/76 126/82 129/81 113/62  Pulse: 93 88 (!) 101 93  Resp: 18 15 20 20   Temp:  98 F (36.7 C) 98.6 F (37 C) 97.8 F (36.6 C)  TempSrc:  Oral Axillary Oral  SpO2: 93% 95% 100% 100%  Weight:      Height:        CBC:  Recent Labs  Lab 04/02/19 0547 04/05/19 0423  WBC 5.5 7.8  HGB 13.8 14.4  HCT 40.8 42.8  MCV 91.7 92.0  PLT 311 025    Basic Metabolic Panel:  Recent Labs  Lab 04/02/19 0547 04/05/19 0423  NA 137 138  K 3.9 4.4  CL 101 103  CO2 23 22  GLUCOSE 132* 113*  BUN 22 32*  CREATININE 0.95 1.00  CALCIUM 9.6 9.8   No results found.   PHYSICAL EXAM GEN: NAD, pleasant, cooperative CVS: RRR, no carotid bruit CHEST: No signs of resp distress, on room air ABD: Soft, NTTP  NEURO:  MENTAL STATUS: Awake, alert, oriented to name.  LANG/SPEECH: dysarthric, speech is not fluent, expressive aphasia with word finding difficulty and perseveration, able to follow commands.   CRANIAL NERVES:  II:  L hemianopia III, IV, VI: EOM intact, no gaze preference or deviation  V: normal  VII: no facial asymmetry  VIII: normal hearing to speech  MOTOR: normal both upper and lower extremities, no drift. Mild left sided weakness. REFLEXES: 2/4 throughout, bilateral flexor planters  SENSORY: Normal to touch, temperature & pin prick in all extremiteis  COORD: L hemichorea/tremor   ASSESSMENT/PLAN [name] is a 68 y.o. 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 Old R frontal infarct.     CTA head & neck - R P1 occlusion. Small BA w/ high grade atheromatous narrowing. L ICA siphon stenosis.  CT perfusion 19 cc penumbra  Cerebral angio TICI 2b revascularization occluded R PCOM and P PCA. Underlying large vessel atherosclerosis   MRI Rt PCA infarct    2D Echo EF 60 to 65%.   Recommend 30-day OP monitoring to rule out A. fib   HIV neg  LDL 101   HgbA1c 9.8  P2Y12 90  SCDs for VTE prophylaxis  aspirin 81 mg daily and clopidogrel 75 mg daily prior to admission, put on aspirin 325 mg daily and clopidogrel 75 mg daily. However, developed itchiness, plavix was discontinued. Now on ASA 81 and Brilinta 90 bid. Continue on discharge.  Therapy recommendations:  SNF  Disposition:  Pending - SW following with family for MCD application  Hx stroke/TIA  11/2018 - presented for dizziness and unsteady, MRI showed right frontal and parietal small white matter infarcts. No LVO. Extensive SVD.  Put on DAPT.  Patient signed [facility]  01/2016 - admitted for gait disability.  MRI negative for acute infarct.  Old infarcts B BG, thalami, L cerebellum.   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 However, pt developed vomiting episodes x 2 days. Will d/c abilify and observe.  Hemichorea movement expected to improve over time.  Hypertension  Home meds:  None listed  Stable on the low end  Continue Lotensin to 5mg  daily . Long-term BP goal normotensive  Hyperlipidemia  Home meds:  No statin listed  LDL 101, goal < 70  AST and ALT normalized   continue Lipitor 40  Continue statin on discharge  Diabetes type II, uncontrolled  Home meds:  glucophage 1000 bid  HgbA1c 9.8, goal < 7.0  Hyperglycemia much improved  On metformin 1000mg  bid from 7/11  CBGs  SSI  Close PCP follow-up  Dysphagia,  resolved . Secondary to stroke . On D3 thin liquids . Cough with food lead to vomiting. CXR ok.  Other Stroke Risk Factors  Advanced age  Overweight, Body mass index is 29.3 kg/m., recommend weight loss, diet and exercise as appropriate   No driving due to hemianopia  Other Active Problems  Constipation, abd xray ok, put on senokot, prn bisacodyl and fleets  Hospital day # 26  Await SNF bed. Pt is difficult to place as per Education officer, museum. Medically stable for discharge when bed available. Antony Contras, MD Medical Director [facility] Stroke Center Pager: [phone] 04/05/2019 12:12 PM    To contact Stroke Continuity provider, please refer to [email or web address] After hours, contact General Neurology

## 2019-04-06 DIAGNOSIS — I69391 Dysphagia following cerebral infarction: Secondary | ICD-10-CM

## 2019-04-06 LAB — GLUCOSE, CAPILLARY
Glucose-Capillary: 99 mg/dL (ref 70–99)
Glucose-Capillary: 113 mg/dL — ABNORMAL HIGH (ref 70–99)
Glucose-Capillary: 149 mg/dL — ABNORMAL HIGH (ref 70–99)
Glucose-Capillary: 79 mg/dL (ref 70–99)

## 2019-04-06 MED ORDER — MAGIC MOUTHWASH
10.0000 mL | Freq: Four times a day (QID) | ORAL | Status: DC | PRN
  Administered 2019-04-06 – 2019-04-07 (×3): 10 mL via ORAL
  Filled 2019-04-06 (×5): qty 10

## 2019-04-06 NOTE — Progress Notes (Signed)
STROKE TEAM PROGRESS NOTE   Interval history:   . Pt is lying in bedr  . Continues to have dysarthria and expressive aphasia. . No new changes. . RN feels patient is complaining of some pain in the back of his throat.  Vitals:   04/05/19 2007 04/06/19 0316 04/06/19 0818 04/06/19 1157  BP: 117/88 (!) 146/98 (!) 126/91 114/86  Pulse: (!) 106 95 88 92  Resp: 18 18 20 20   Temp: 98 F (36.7 C) 97.9 F (36.6 C) 97.7 F (36.5 C) (!) 97.5 F (36.4 C)  TempSrc: Oral Oral Oral Oral  SpO2: 98% 100% 97% 100%  Weight:      Height:        CBC:  Recent Labs  Lab 04/02/19 0547 04/05/19 0423  WBC 5.5 7.8  HGB 13.8 14.4  HCT 40.8 42.8  MCV 91.7 92.0  PLT 311 119    Basic Metabolic Panel:  Recent Labs  Lab 04/02/19 0547 04/05/19 0423  NA 137 138  K 3.9 4.4  CL 101 103  CO2 23 22  GLUCOSE 132* 113*  BUN 22 32*  CREATININE 0.95 1.00  CALCIUM 9.6 9.8   No results found.   PHYSICAL EXAM GEN: NAD, pleasant, cooperative CVS: RRR, no carotid bruit CHEST: No signs of resp distress, on room air ABD: Soft, NTTP  NEURO:  MENTAL STATUS: Awake, alert, oriented to name.  LANG/SPEECH: dysarthric, speech is not fluent, expressive aphasia with word finding difficulty and perseveration, able to follow commands.   CRANIAL NERVES:  II:  L hemianopia III, IV, VI: EOM intact, no gaze preference or deviation  V: normal  VII: no facial asymmetry  VIII: normal hearing to speech  MOTOR: normal both upper and lower extremities, no drift. Mild left sided weakness. REFLEXES: 2/4 throughout, bilateral flexor planters  SENSORY: Normal to touch, temperature & pin prick in all extremiteis  COORD: L hemichorea/tremor   ASSESSMENT/PLAN Joe Perry is a 68 y.o. 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 Old R frontal infarct.    CTA head & neck - R P1 occlusion. Small BA w/ high grade atheromatous narrowing. L ICA siphon stenosis.  CT perfusion 19 cc penumbra  Cerebral angio TICI 2b revascularization occluded R PCOM and P PCA. Underlying large vessel atherosclerosis   MRI Rt PCA infarct    2D Echo EF 60 to 65%.   Recommend 30-day OP monitoring to rule out A. fib   HIV neg  LDL 101   HgbA1c 9.8  P2Y12 90  SCDs for VTE prophylaxis  aspirin 81 mg daily and clopidogrel 75 mg daily prior to admission, put on aspirin 325 mg daily and clopidogrel 75 mg daily. However, developed itchiness, plavix was discontinued. Now on ASA 81 and Brilinta 90 bid. Continue on discharge.  Therapy recommendations:  SNF  Disposition:  Pending - SW following with family for MCD application  Hx stroke/TIA  11/2018 - presented for dizziness and unsteady, MRI showed right frontal and parietal small white matter infarcts. No LVO. Extensive SVD.  Put on DAPT.  Patient signed [facility]  01/2016 - admitted for gait disability.  MRI negative for acute infarct.  Old infarcts B BG, thalami, L cerebellum.   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 However, pt developed vomiting episodes x 2 days. Will d/c abilify and observe.  Hemichorea movement expected to improve over time.  Hypertension  Home meds:  None listed  Stable on the low end  Continue Lotensin to 5mg  daily . Long-term BP goal normotensive  Hyperlipidemia  Home meds:  No statin listed  LDL 101, goal < 70  AST and ALT normalized   Continue Lipitor 40  Continue statin on discharge  Diabetes type II, uncontrolled  Home meds:  glucophage 1000 bid  HgbA1c 9.8, goal < 7.0  Hyperglycemia much improved  On metformin 1000mg  bid from  7/11  CBGs  SSI  Close PCP follow-up  Dysphagia, resolved . Secondary to stroke . On D3 thin liquids . Cough with food lead to vomiting. CXR ok.  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  Await SNF bed.  Antony Contras, MD  To contact Stroke Continuity provider, please refer to [email or web address] After hours, contact General Neurology

## 2019-04-07 LAB — GLUCOSE, CAPILLARY
Glucose-Capillary: 78 mg/dL (ref 70–99)
Glucose-Capillary: 138 mg/dL — ABNORMAL HIGH (ref 70–99)
Glucose-Capillary: 67 mg/dL — ABNORMAL LOW (ref 70–99)
Glucose-Capillary: 85 mg/dL (ref 70–99)
Glucose-Capillary: 102 mg/dL — ABNORMAL HIGH (ref 70–99)

## 2019-04-07 MED ORDER — QUETIAPINE FUMARATE 25 MG PO TABS
12.5000 mg | ORAL_TABLET | Freq: Every day | ORAL | Status: DC
  Administered 2019-04-07 – 2019-04-29 (×23): 12.5 mg via ORAL
  Filled 2019-04-07 (×23): qty 1

## 2019-04-07 NOTE — Progress Notes (Signed)
STROKE TEAM PROGRESS NOTE   Interval history:  No significant changes.  Not complaining of throat pain today.  Vital signs remained stable.  Blood sugars are acceptable.  Vitals:   04/06/19 2009 04/06/19 2341 04/07/19 0416 04/07/19 1023  BP: 101/69 127/83 139/84 111/76  Pulse: 86 81 84 83  Resp: 16 16 18 20   Temp: 97.9 F (36.6 C) 98 F (36.7 C) 97.6 F (36.4 C) 97.7 F (36.5 C)  TempSrc: Oral Oral Oral Oral  SpO2: 99% 99% 100% 100%  Weight:      Height:        CBC:  Recent Labs  Lab 04/02/19 0547 04/05/19 0423  WBC 5.5 7.8  HGB 13.8 14.4  HCT 40.8 42.8  MCV 91.7 92.0  PLT 311 973    Basic Metabolic Panel:  Recent Labs  Lab 04/02/19 0547 04/05/19 0423  NA 137 138  K 3.9 4.4  CL 101 103  CO2 23 22  GLUCOSE 132* 113*  BUN 22 32*  CREATININE 0.95 1.00  CALCIUM 9.6 9.8   No results found.   PHYSICAL EXAM  GEN: NAD, pleasant, cooperative CVS: RRR, no carotid bruit CHEST: No signs of resp distress, on room air ABD: Soft, NTTP  NEURO:  MENTAL STATUS: Awake, alert, oriented to name.  LANG/SPEECH: dysarthric, speech is not fluent, expressive aphasia with word finding difficulty and perseveration, able to follow commands.   CRANIAL NERVES:  II:  L hemianopia III, IV, VI: EOM intact, no gaze preference or deviation  V: normal  VII: no facial asymmetry  VIII: normal hearing to speech  MOTOR: normal both upper and lower extremities, no drift. Mild left sided weakness. REFLEXES: 2/4 throughout, bilateral flexor planters  SENSORY: Normal to touch, temperature & pin prick in all extremiteis  COORD: L hemichorea/tremor   ASSESSMENT/PLAN [name] is a 68 y.o. 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 Old R frontal infarct.    CTA head & neck - R P1  occlusion. Small BA w/ high grade atheromatous narrowing. L ICA siphon stenosis.  CT perfusion 19 cc penumbra  Cerebral angio TICI 2b revascularization occluded R PCOM and P PCA. Underlying large vessel atherosclerosis   MRI Rt PCA infarct    2D Echo EF 60 to 65%.   Recommend 30-day OP monitoring to rule out A. fib   HIV neg  LDL 101   HgbA1c 9.8  P2Y12 90  SCDs for VTE prophylaxis  aspirin 81 mg daily and clopidogrel 75 mg daily prior to admission, put on aspirin 325 mg daily and clopidogrel 75 mg daily. However, developed itchiness, plavix was discontinued. Now on ASA 81 and Brilinta 90 bid. Continue on discharge.  Therapy recommendations:  SNF  Disposition:  Pending - SW following with family for MCD application  Hx stroke/TIA  11/2018 - presented for dizziness and unsteady, MRI showed right frontal and parietal small white matter infarcts. No LVO. Extensive SVD.  Put on DAPT.  Patient signed [facility]  01/2016 - admitted for gait disability.  MRI negative for acute infarct.  Old infarcts B BG, thalami, L cerebellum.   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 However, pt developed vomiting episodes x 2 days. Improved after off abilify.  Hemichorea movement slowly improving  Hypertension  Home meds:  None listed  Stable on the low end  Continue Lotensin to 5mg  daily . Long-term BP goal normotensive  Hyperlipidemia  Home meds:  No statin listed  LDL 101, goal < 70  AST and ALT normalized   Continue Lipitor 40  Continue statin on discharge  Diabetes type II, uncontrolled  Home meds:  glucophage 1000 bid  HgbA1c 9.8, goal < 7.0  Hyperglycemia much improved  On metformin 1000mg  bid from 7/11  CBGs  SSI  Close PCP follow-up  Dysphagia, resolved . Secondary to stroke . On D3 thin  liquids . Cough with food lead to vomiting. CXR ok.  Other Stroke Risk Factors  Advanced age  Overweight, Body mass index is 29.3 kg/m., recommend weight loss, diet and exercise as appropriate   No driving due to hemianopia  Other Active Problems  Constipation, abd xray ok, put on senokot, prn bisacodyl and fleets  New seroquel at hs  Hospital day # 28   Social work working with family to coordinate possible nursing home discharge.  Medically stable for discharge but no bed available yet  Antony Contras, MD   To contact Stroke Continuity provider, please refer to [email or web address] After hours, contact General Neurology

## 2019-04-07 NOTE — Progress Notes (Signed)
  Speech Language Pathology Treatment: Dysphagia  Patient Details Name: Joe Perry MRN: [id number] DOB: 07-29-1951 Today's Date: 04/07/2019 Time: 7482-7078 SLP Time Calculation (min) (ACUTE ONLY): 11 min  Assessment / Plan / Recommendation Clinical Impression  RN reports pt exhibiting difficulty with PO intake, most notably facial grimacing and very slow rate of consumption.  Pt has been given magic mouthwash over the weekend to try to address any sources of oral/pharyngeal discomfort.  SLP reassessed pt with thin liquid, soft solids, and regular solids.  Pt tolerated all consistencies with no clinical s/s of aspiration, and exhibited excellent oral clearance.  Grimacing was noted intermittently.  On one occasion, pt endorsed distaste for food item.  Pt could not consistently verbalize what caused change in facial expression, but does not appear to be in pain.  Decreased PO intake and slow rate may be related to distaste for food.  RN reports having tried many different flavors and textures over the weekend to encourage oral intake.  Pt appears safe to upgrade to regular texture diet, which may improve intake, or may make mastication more difficult.  Pt appears to be adequately protecting airway and achieved good oral clearance of regular solids. RN expressed concern with advance texture given pt's performance at AM meal.  Food had to be cut into very small pieces and pt was noted to hold food during meal.  Pt will need careful 1:1 assistance with meals, and is at risk of failing to meet nutritional needs if time and assistance cannot be provided.  Recommend continuing mechanical soft diet with thin liquid.   HPI HPI: 68 year old male admitted 03/10/2019 with left weakness, unsteady gait, right gaze, left hemianopsia. PMH: CVA, HTN, DM. CT negative. MRI Ischemic infarct of the right posterior cerebral artery, territory, roughly corresponding to the ischemic volume.  CXR 7/29: "No acute cardiopulmonary  disease. Specifically, no discrete focal airspace opacities to suggest aspiration"      SLP Plan  Continue with current plan of care       Recommendations  Diet recommendations: Dysphagia 3 (mechanical soft);Thin liquid Liquids provided via: Cup;Straw Medication Administration: Crushed with puree Supervision: Staff to assist with self feeding;Full supervision/cueing for compensatory strategies Compensations: Slow rate;Small sips/bites;Minimize environmental distractions Postural Changes and/or Swallow Maneuvers: Seated upright 90 degrees;Upright 30-60 min after meal                Oral Care Recommendations: Oral care BID Follow up Recommendations: (Continue ST at next level of care) SLP Visit Diagnosis: Dysphagia, unspecified (R13.10) Plan: Continue with current plan of care       [place], [place], [place] Office: [phone] 04/07/2019, 3:44 PM

## 2019-04-07 NOTE — Progress Notes (Signed)
Physical Therapy Treatment Patient Details Name: Joe Perry MRN: [id number] DOB: Dec 07, 1950 Today's Date: 04/07/2019    History of Present Illness 68 yo male s/p  R PCOM and R PCA occlusion s/p emergent thrombectomy on 7/6. Pt admitteed with L sided weakness, R gaze perference, L homonymous hemianopsia. 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    PT Comments    Pt mobilizing out of bed with min A today, worked on standing balance and wt shifting. Ambulation limited due to pt urination on self and in floor. Minimal verbalization today throughout session. PT will continue to follow.    Follow Up Recommendations  SNF;Supervision/Assistance - 24 hour     Equipment Recommendations  None recommended by PT    Recommendations for Other Services Rehab consult     Precautions / Restrictions Precautions Precautions: Fall Precaution Comments: L hemianopsia; L inattention Restrictions Weight Bearing Restrictions: No    Mobility  Bed Mobility Overal bed mobility: Needs Assistance Bed Mobility: Supine to Sit Rolling: Min assist   Supine to sit: Min assist     General bed mobility comments: tactile cues to initiate and min A at trunk to achieve full upright sitting, R lean at first but corrected with min A  Transfers Overall transfer level: Needs assistance Equipment used: 1 person hand held assist Transfers: Sit to/from [facility] Sit to Stand: Min assist Stand pivot transfers: Min assist       General transfer comment: pt with sufficient power to stand today, min A to maintain midline with standing and pivoting  Ambulation/Gait Ambulation/Gait assistance: +2 safety/equipment;Min assist Gait Distance (Feet): 4 Feet Assistive device: 1 person hand held assist Gait Pattern/deviations: Step-through pattern;Decreased step length - left;Decreased weight shift to right;Decreased stride length(heavy L lateral bias) Gait velocity: decreased Gait velocity  interpretation: <1.31 ft/sec, indicative of household ambulator General Gait Details: pt urinated on floor in self while performing standing balance activities at sink so walking distance limited. Noted mild instability L knee. Min A for R wt shift   Stairs             Wheelchair Mobility    Modified Rankin (Stroke Patients Only) Modified Rankin (Stroke Patients Only) Pre-Morbid Rankin Score: No symptoms Modified Rankin: Moderately severe disability     Balance Overall balance assessment: Needs assistance Sitting-balance support: Feet supported;Single extremity supported Sitting balance-[name]: Fair Sitting balance - Comments: maintained sitting balance EOB with LE ther ex with no LOB   Standing balance support: During functional activity;Bilateral upper extremity supported Standing balance-[name]: Poor Standing balance comment: min A to maintain standing               High Level Balance Comments: worked on reaching and coordination tasks in standing, tactile cues needed in part due to hemianopsia. Worked on balance in front of mirror but pt had difficulty attending.             Cognition Arousal/Alertness: Awake/alert Behavior During Therapy: Flat affect Overall Cognitive Status: Impaired/Different from baseline Area of Impairment: Following commands;Safety/judgement;Problem solving;Orientation;Attention                 Orientation Level: Time;Situation Current Attention Level: Sustained Memory: Decreased recall of precautions;Decreased short-term memory Following Commands: Follows one step commands with increased time;Follows one step commands inconsistently Safety/Judgement: Decreased awareness of safety;Decreased awareness of deficits Awareness: Intellectual Problem Solving: Requires verbal cues;Requires tactile cues;Difficulty sequencing;Slow processing General Comments: alert during session but did not verbalize much  Exercises  General Exercises - Lower Extremity Long Arc Quad: AROM;Both;10 reps;Seated    General Comments        Pertinent Vitals/Pain Pain Assessment: Faces Faces Pain Scale: No hurt    Home Living                      Prior Function            PT Goals (current goals can now be found in the care plan section) Acute Rehab PT Goals Patient Stated Goal: none stated PT Goal Formulation: With patient Time For Goal Achievement: 03/26/19 Potential to Achieve Goals: Good Progress towards PT goals: Progressing toward goals    Frequency    Min 3X/week      PT Plan Current plan remains appropriate    Co-evaluation              AM-PAC PT "6 Clicks" Mobility   Outcome Measure  Help needed turning from your back to your side while in a flat bed without using bedrails?: A Little Help needed moving from lying on your back to sitting on the side of a flat bed without using bedrails?: A Little Help needed moving to and from a bed to a chair (including a wheelchair)?: A Lot Help needed standing up from a chair using your arms (e.g., wheelchair or bedside chair)?: A Lot Help needed to walk in hospital room?: A Lot Help needed climbing 3-5 steps with a railing? : Total 6 Click Score: 13    End of Session Equipment Utilized During Treatment: Gait belt Activity Tolerance: Patient tolerated treatment well Patient left: in chair;with call bell/phone within reach;with chair alarm set Nurse Communication: Mobility status PT Visit Diagnosis: Muscle weakness (generalized) (M62.81);Hemiplegia and hemiparesis;Other abnormalities of gait and mobility (R26.89) Hemiplegia - Right/Left: Left Hemiplegia - dominant/non-dominant: Dominant Hemiplegia - caused by: Cerebral infarction     Time: 1962-2297 PT Time Calculation (min) (ACUTE ONLY): 25 min  Charges:  $Therapeutic Activity: 8-22 mins $Neuromuscular Re-education: 8-22 mins                     Leighton Roach, PT  Acute  Rehab Services  Pager [phone] Office [place] 04/07/2019, 2:24 PM

## 2019-04-07 NOTE — TOC Progression Note (Signed)
Transition of Care [facility]) - Progression Note    Patient Details  Name: Joe Perry MRN: [id number] Date of Birth: March 04, 1951  [facility] CM/SW [place], [place] Phone Number: 04/07/2019, 1:03 PM  Clinical Narrative:   CSW following for discharge plan. With basic banking information obtained last week, financial counseling was able to complete a Medicaid application on the patient. Disability is still not able to be completed; financial counseling is continuing to work to see if the family is able to find enough information for disability referral to be made. For now, patient remains difficult to place.  Patient's wife also called as the patient has the utilities in his name, and some of them are being shut off because of nonpayment due to patient's hospital stay. Patient's wife needs a letter that the patient is here and unable to pay his bills himself so that the wife can handle payment so that the utilities to the house aren't shut off. CSW to complete letter, wife will pick up tomorrow. CSW provided assistance for wife to talk to patient over the phone for a few minutes.    Expected Discharge Plan: [place] Barriers to Discharge: Continued Medical Work up, SNF Pending Medicaid  Expected Discharge Plan and Services Expected Discharge Plan: [place] In-house Referral: Clinical Social Work Discharge Planning Services: NA Post Acute Care Choice: [place] Living arrangements for the past 2 months: Single Family Home                 DME Arranged: N/A DME Agency: NA     Representative spoke with at DME Agency: na HH Arranged: NA [place] Agency: NA         Social Determinants of Health (SDOH) Interventions    Readmission Risk Interventions No flowsheet data found.

## 2019-04-08 LAB — GLUCOSE, CAPILLARY
Glucose-Capillary: 100 mg/dL — ABNORMAL HIGH (ref 70–99)
Glucose-Capillary: 209 mg/dL — ABNORMAL HIGH (ref 70–99)
Glucose-Capillary: 120 mg/dL — ABNORMAL HIGH (ref 70–99)
Glucose-Capillary: 169 mg/dL — ABNORMAL HIGH (ref 70–99)

## 2019-04-08 NOTE — Progress Notes (Signed)
STROKE TEAM PROGRESS NOTE   Interval history:  Patient is lying comfortably in bed.  He has no complaints.  Nurse is helping him eat  Vitals:   04/07/19 2321 04/08/19 0357 04/08/19 0934 04/08/19 1235  BP: 129/76 118/83 115/81 (!) 147/62  Pulse: 80 86 85 99  Resp: 18 18 18 17   Temp: 98 F (36.7 C) 97.8 F (36.6 C) (!) 97.5 F (36.4 C) 97.7 F (36.5 C)  TempSrc: Oral Oral Oral Oral  SpO2: 100% 99% 93% 100%  Weight:      Height:        CBC:  Recent Labs  Lab 04/02/19 0547 04/05/19 0423  WBC 5.5 7.8  HGB 13.8 14.4  HCT 40.8 42.8  MCV 91.7 92.0  PLT 311 250    Basic Metabolic Panel:  Recent Labs  Lab 04/02/19 0547 04/05/19 0423  NA 137 138  K 3.9 4.4  CL 101 103  CO2 23 22  GLUCOSE 132* 113*  BUN 22 32*  CREATININE 0.95 1.00  CALCIUM 9.6 9.8    PHYSICAL EXAM   GEN: NAD, pleasant, cooperative CVS: RRR, no carotid bruit CHEST: No signs of resp distress, on room air ABD: Soft, NTTP  NEURO:  MENTAL STATUS: Awake, alert, oriented to name.  LANG/SPEECH: dysarthric, speech is not fluent, expressive aphasia with word finding difficulty and perseveration, able to follow commands.   CRANIAL NERVES:  II:  L hemianopia III, IV, VI: EOM intact, no gaze preference or deviation  V: normal  VII: no facial asymmetry  VIII: normal hearing to speech  MOTOR: normal both upper and lower extremities, no drift. Mild left sided weakness. REFLEXES: 2/4 throughout, bilateral flexor planters  SENSORY: Normal to touch, temperature & pin prick in all extremiteis  COORD: L hemichorea/tremor   ASSESSMENT/PLAN [name] is a 68 y.o. 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 Old R frontal infarct.    CTA head & neck - R P1 occlusion. Small BA w/ high grade atheromatous  narrowing. L ICA siphon stenosis.  CT perfusion 19 cc penumbra  Cerebral angio TICI 2b revascularization occluded R PCOM and P PCA. Underlying large vessel atherosclerosis   MRI Rt PCA infarct    2D Echo EF 60 to 65%.   Recommend 30-day OP monitoring to rule out A. fib   HIV neg  LDL 101   HgbA1c 9.8  P2Y12 90  SCDs for VTE prophylaxis  aspirin 81 mg daily and clopidogrel 75 mg daily prior to admission, put on aspirin 325 mg daily and clopidogrel 75 mg daily. However, developed itchiness, plavix was discontinued. Now on ASA 81 and Brilinta 90 bid. Continue on discharge.  Therapy recommendations:  SNF  Disposition:  Pending -  family working on MCD application  Repeat labs weekly - next scheduled for Sat 04/12/19  Hx stroke/TIA  11/2018 - presented for dizziness and unsteady, MRI showed right frontal and parietal small white matter infarcts. No LVO. Extensive SVD.  Put on DAPT.  Patient signed [facility]  01/2016 - admitted for gait disability.  MRI negative for acute infarct.  Old infarcts B BG, thalami, L cerebellum.   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 However, pt developed vomiting episodes x 2 days. Improved after off abilify.  Hemichorea movement slowly improving  Hypertension  Home meds:  None listed  Stable on the low end  Continue Lotensin to 5mg  daily . Long-term BP goal normotensive  Hyperlipidemia  Home meds:  No statin listed  LDL 101, goal < 70  AST and ALT normalized   Continue Lipitor 40  Continue statin on discharge  Diabetes type II, uncontrolled  Home meds:  glucophage 1000 bid  HgbA1c 9.8, goal < 7.0  Hyperglycemia much improved  On metformin 1000mg  bid from 7/11  CBGs  SSI  Close PCP follow-up  Dysphagia, resolved . Secondary to stroke . On D3 thin  liquids . Cough with food lead to vomiting. CXR ok.  Other Stroke Risk Factors  Advanced age  Overweight, Body mass index is 29.3 kg/m., recommend weight loss, diet and exercise as appropriate   No driving due to hemianopia  Other Active Problems  Constipation, abd xray ok, put on senokot, prn bisacodyl and fleets  Insomnia/day night confusion - seroquel at hs, helping  Hospital day # 29   Patient remains difficult to place as per social work.  Continue present treatment.  Patient medically stable for discharge to nursing home when bed is available  Antony Contras, MD   To contact Stroke Continuity provider, please refer to [email or web address] After hours, contact General Neurology

## 2019-04-09 DIAGNOSIS — I639 Cerebral infarction, unspecified: Secondary | ICD-10-CM

## 2019-04-09 LAB — GLUCOSE, CAPILLARY
Glucose-Capillary: 120 mg/dL — ABNORMAL HIGH (ref 70–99)
Glucose-Capillary: 173 mg/dL — ABNORMAL HIGH (ref 70–99)
Glucose-Capillary: 139 mg/dL — ABNORMAL HIGH (ref 70–99)
Glucose-Capillary: 151 mg/dL — ABNORMAL HIGH (ref 70–99)

## 2019-04-09 NOTE — Progress Notes (Signed)
  Speech Language Pathology Treatment: Cognitive-Linquistic  Patient Details Name: Joe Perry MRN: [id number] DOB: 06-Mar-1951 Today's Date: 04/09/2019 Time: 1539-1600 SLP Time Calculation (min) (ACUTE ONLY): 21 min  Assessment / Plan / Recommendation Clinical Impression  Pt was seen for treatment and he was able to maintain an adequate level of alertness throughout the session. However, his verbal output was more limited than that which was demonstrated during prior sessions and he required increased encouragement to participate in the session as well as to attend to tasks. Perseversation continues to be noted but he was less responsive to verbal cues. He demonstrated 0% accuracy with confrontational naming despite max verbal cues. He achieved 20% accuracy with phrase and sentence completion increasing to 80% accuracy with phonemic cues. He did not participate in structured auditory comprehension tasks or respond to open-ended questions during this session. SLP will continue to follow pt.    HPI HPI: 68 year old male admitted 03/10/2019 with left weakness, unsteady gait, right gaze, left hemianopsia. PMH: CVA, HTN, DM. CT negative. MRI Ischemic infarct of the right posterior cerebral artery, territory, roughly corresponding to the ischemic volume.      SLP Plan  Continue with current plan of care       Recommendations                   Oral Care Recommendations: Oral care BID Follow up Recommendations: Skilled Nursing facility SLP Visit Diagnosis: Cognitive communication deficit (R41.841);Aphasia (R47.01);Dysphagia, unspecified (R13.10) Plan: Continue with current plan of care       Jeraldean Wechter I. Hardin Negus, [place], [place] Office number [phone] Pager [place] 04/09/2019, 4:04 PM

## 2019-04-09 NOTE — Progress Notes (Signed)
STROKE TEAM PROGRESS NOTE   Interval history:  He remains stable. No changes, sitting up in bed  Vitals:   04/08/19 2314 04/09/19 0300 04/09/19 0741 04/09/19 1107  BP: 119/61 122/67 105/77 119/84  Pulse: 88 76 74 91  Resp: 18 18 18 18   Temp: 97.8 F (36.6 C) 98 F (36.7 C) 98.6 F (37 C) 97.9 F (36.6 C)  TempSrc: Oral Oral Axillary Oral  SpO2: 98% 99%  100%  Weight:      Height:        CBC:  Recent Labs  Lab 04/05/19 0423  WBC 7.8  HGB 14.4  HCT 42.8  MCV 92.0  PLT 858    Basic Metabolic Panel:  Recent Labs  Lab 04/05/19 0423  NA 138  K 4.4  CL 103  CO2 22  GLUCOSE 113*  BUN 32*  CREATININE 1.00  CALCIUM 9.8    PHYSICAL EXAM    GEN: NAD, pleasant, cooperative CVS: RRR, no carotid bruit CHEST: No signs of resp distress, on room air ABD: Soft, NTTP  NEURO:  MENTAL STATUS: Awake, alert, oriented to name.  LANG/SPEECH: dysarthric, speech is not fluent, expressive aphasia with word finding difficulty and perseveration, able to follow commands.   CRANIAL NERVES:  II:  L hemianopia III, IV, VI: EOM intact, no gaze preference or deviation  V: normal  VII: no facial asymmetry  VIII: normal hearing to speech  MOTOR: normal both upper and lower extremities, no drift. Mild left sided weakness. REFLEXES: 2/4 throughout, bilateral flexor planters  SENSORY: Normal to touch, temperature & pin prick in all extremiteis  COORD: L hemichorea/tremor   ASSESSMENT/PLAN Joe Perry is a 68 y.o. 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 Old R frontal infarct.    CTA head & neck - R P1 occlusion. Small BA w/ high grade atheromatous narrowing. L ICA siphon stenosis.  CT perfusion 19 cc penumbra  Cerebral angio TICI 2b revascularization occluded R PCOM and P PCA.  Underlying large vessel atherosclerosis   MRI Rt PCA infarct    2D Echo EF 60 to 65%.   Recommend 30-day OP monitoring to rule out A. fib   HIV neg  LDL 101   HgbA1c 9.8  P2Y12 90  SCDs for VTE prophylaxis  aspirin 81 mg daily and clopidogrel 75 mg daily prior to admission, put on aspirin 325 mg daily and clopidogrel 75 mg daily. However, developed itchiness, plavix was discontinued. Now on ASA 81 and Brilinta 90 bid. Continue on discharge.  Therapy recommendations:  SNF  Disposition:  Pending -  family working on MCD application  Repeat labs weekly - next scheduled for Sat 04/12/19  Hx stroke/TIA  11/2018 - presented for dizziness and unsteady, MRI showed right frontal and parietal small white matter infarcts. No LVO. Extensive SVD.  Put on DAPT.  Patient signed [facility]  01/2016 - admitted for gait disability.  MRI negative for acute infarct.  Old infarcts B BG, thalami, L cerebellum.   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 However, pt developed vomiting episodes x 2 days. Improved after off abilify.  Hemichorea movement slowly improving  Hypertension  Home meds:  None listed  Stable   Continue Lotensin to 5mg  daily . Long-term BP goal normotensive  Hyperlipidemia  Home meds:  No statin listed  LDL 101, goal < 70  AST and ALT normalized   Continue Lipitor 40  Continue statin on discharge  Diabetes type II, uncontrolled  Home meds:  glucophage 1000 bid  HgbA1c 9.8, goal < 7.0  Hyperglycemia much improved  On metformin 1000mg  bid from 7/11  CBGs  SSI  Close PCP follow-up  Dysphagia, resolved . Secondary to stroke . On D3 thin liquids . Cough with food lead to vomiting. CXR ok.  Other Stroke Risk Factors  Advanced age  Overweight, Body mass index is 29.3 kg/m., recommend  weight loss, diet and exercise as appropriate   No driving due to hemianopia  Other Active Problems  Constipation, abd xray ok, put on senokot, prn bisacodyl and fleets  Insomnia/day night confusion - seroquel at hs, helping  Hospital day # 30   Patient is medically stable but no nursing home bed available and he remains difficult to place.  Antony Contras, MD   To contact Stroke Continuity provider, please refer to [email or web address] After hours, contact General Neurology

## 2019-04-09 NOTE — Progress Notes (Signed)
Physical Therapy Treatment Patient Details Name: Joe Perry MRN: [id number] DOB: 1951-02-25 Today's Date: 04/09/2019    History of Present Illness 68 yo male s/p  R PCOM and R PCA occlusion s/p emergent thrombectomy on 7/6. Pt admitteed with L sided weakness, R gaze perference, L homonymous hemianopsia. 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    PT Comments    Pt was sleepy and mitted when PT arrived, and was able to be assisted to move LLE with cues for exercise.  Pt is R focused but when PT began to work on RLE was somewhat participating.  Repositioned with L heel floating, supported posture on bed and worked to increase comfort in bed with pt.  He is lethargic and closed his eyes when PT finished, has active telemonitoring to manage his confusion and fall risk.  Continue acutely with gait progression when able.   Follow Up Recommendations  SNF;Supervision/Assistance - 24 hour     Equipment Recommendations  None recommended by PT    Recommendations for Other Services Rehab consult     Precautions / Restrictions Precautions Precautions: Fall Precaution Comments: L hemianopsia; L inattention Restrictions Weight Bearing Restrictions: No    Mobility  Bed Mobility Overal bed mobility: Needs Assistance             General bed mobility comments: repositioning on bed with L heel elevated and posture supported  Transfers                    Ambulation/Gait                 Stairs             Wheelchair Mobility    Modified Rankin (Stroke Patients Only)       Balance                                            Cognition Arousal/Alertness: Lethargic Behavior During Therapy: Flat affect Overall Cognitive Status: Impaired/Different from baseline Area of Impairment: Orientation;Attention;Memory;Following commands;Safety/judgement;Awareness;Problem solving                 Orientation Level: Situation;Time Current  Attention Level: Selective Memory: Decreased recall of precautions;Decreased short-term memory Following Commands: Follows one step commands inconsistently;Follows one step commands with increased time Safety/Judgement: Decreased awareness of safety;Decreased awareness of deficits Awareness: Intellectual Problem Solving: Requires verbal cues;Requires tactile cues;Decreased initiation;Slow processing General Comments: flat and lethargic, had to continually prompt to keep awake      Exercises General Exercises - Lower Extremity Ankle Circles/Pumps: AAROM;Both;5 reps Heel Slides: AAROM;Both;10 reps;PROM Hip ABduction/ADduction: AAROM;PROM;Both;10 reps Hip Flexion/Marching: AAROM;PROM;Both;10 reps    General Comments        Pertinent Vitals/Pain Pain Assessment: No/denies pain Faces Pain Scale: No hurt    Home Living                      Prior Function            PT Goals (current goals can now be found in the care plan section) Acute Rehab PT Goals Patient Stated Goal: none stated PT Goal Formulation: With patient    Frequency    Min 3X/week      PT Plan Current plan remains appropriate    Co-evaluation  AM-PAC PT "6 Clicks" Mobility   Outcome Measure  Help needed turning from your back to your side while in a flat bed without using bedrails?: A Lot Help needed moving from lying on your back to sitting on the side of a flat bed without using bedrails?: A Lot Help needed moving to and from a bed to a chair (including a wheelchair)?: A Lot Help needed standing up from a chair using your arms (e.g., wheelchair or bedside chair)?: A Lot Help needed to walk in hospital room?: A Lot Help needed climbing 3-5 steps with a railing? : Total 6 Click Score: 11    End of Session   Activity Tolerance: Patient tolerated treatment well Patient left: with call bell/phone within reach;in bed;with bed alarm set Nurse Communication: Mobility  status PT Visit Diagnosis: Muscle weakness (generalized) (M62.81);Hemiplegia and hemiparesis;Other abnormalities of gait and mobility (R26.89) Hemiplegia - Right/Left: Left Hemiplegia - dominant/non-dominant: Dominant Hemiplegia - caused by: Cerebral infarction     Time: 2202-5427 PT Time Calculation (min) (ACUTE ONLY): 24 min  Charges:  $Therapeutic Exercise: 8-22 mins $Therapeutic Activity: 8-22 mins                     Ramond Dial 04/09/2019, 5:00 PM   Mee Hives, PT MS Acute Rehab Dept. Number: [place] and [place]

## 2019-04-10 LAB — GLUCOSE, CAPILLARY
Glucose-Capillary: 134 mg/dL — ABNORMAL HIGH (ref 70–99)
Glucose-Capillary: 162 mg/dL — ABNORMAL HIGH (ref 70–99)
Glucose-Capillary: 141 mg/dL — ABNORMAL HIGH (ref 70–99)
Glucose-Capillary: 186 mg/dL — ABNORMAL HIGH (ref 70–99)

## 2019-04-10 NOTE — Progress Notes (Signed)
STROKE TEAM PROGRESS NOTE   Interval history:  No one is at bedside. Patient with eyes open but not interactive. No acute distress, comfortable, non verbal.   Vitals:   04/09/19 2321 04/10/19 0432 04/10/19 0739 04/10/19 1138  BP: 101/66 127/77 107/69 99/74  Pulse: 65 85 62 72  Resp: 14 15 18 18   Temp: 98.3 F (36.8 C) 98.7 F (37.1 C) 97.9 F (36.6 C) 97.9 F (36.6 C)  TempSrc: Oral Oral Oral Oral  SpO2: 100% 100% 96% 98%  Weight:      Height:        CBC:  Recent Labs  Lab 04/05/19 0423  WBC 7.8  HGB 14.4  HCT 42.8  MCV 92.0  PLT 258    Basic Metabolic Panel:  Recent Labs  Lab 04/05/19 0423  NA 138  K 4.4  CL 103  CO2 22  GLUCOSE 113*  BUN 32*  CREATININE 1.00  CALCIUM 9.8    PHYSICAL EXAM    GEN: NAD, pleasant, cooperative CVS: RRR, no carotid bruit CHEST: No signs of resp distress, on room air ABD: Soft, NTTP  NEURO:  MENTAL STATUS: Awake, alert, non verbal LANG/SPEECH: dysarthric, aphasia, not following commands.   CRANIAL NERVES:  II:  Does not blink to threat on the left III, IV, VI: conjugate midline gaze, tracks but does not cross the midline to the left V: normal  VII: no facial asymmetry  VIII:  Appears to hear me when speaking in normal ton MOTOR: difficult exam appears to be antigravity without drift on leftarm and anti-gravity with drift right arm but not cooperative Reflex: Toes downgoing  SENSORY: withdraws in all extremities   ASSESSMENT/PLAN Joe Perry is a 68 y.o. 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 Old R frontal infarct.    CTA head & neck - R P1 occlusion. Small BA w/ high grade atheromatous narrowing. L ICA siphon stenosis.  CT perfusion 19 cc penumbra  Cerebral angio TICI 2b revascularization occluded R PCOM  and P PCA. Underlying large vessel atherosclerosis   MRI Rt PCA infarct    2D Echo EF 60 to 65%.   Recommend 30-day OP monitoring to rule out A. fib   HIV neg  LDL 101   HgbA1c 9.8  P2Y12 90  SCDs for VTE prophylaxis  aspirin 81 mg daily and clopidogrel 75 mg daily prior to admission, put on aspirin 325 mg daily and clopidogrel 75 mg daily. However, developed itchiness, plavix was discontinued. Now on ASA 81 and Brilinta 90 bid. Continue on discharge.  Therapy recommendations:  SNF  Disposition:  Pending -  MCD pending   Repeat labs weekly - next scheduled for Sat 04/12/19  Hx stroke/TIA  11/2018 - presented for dizziness and unsteady, MRI showed right frontal and parietal small white matter infarcts. No LVO. Extensive SVD.  Put on DAPT.  Patient signed [facility]  01/2016 - admitted for gait disability.  MRI negative for acute infarct.  Old infarcts B BG, thalami, L cerebellum.   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 However, pt developed vomiting episodes x 2 days. Improved after off abilify.  Hemichorea movement slowly improving  Hypertension  Home meds:  None listed  Stable on the low side   Continue Lotensin to 5mg  daily . BP goal normotensive . monitor  Hyperlipidemia  Home meds:  No statin listed  LDL 101, goal < 70  AST and ALT normalized   Continue Lipitor 40  Continue statin on discharge  Diabetes type II, uncontrolled  Home meds:  glucophage 1000 bid  HgbA1c 9.8, goal < 7.0  Hyperglycemia much improved  On metformin 1000mg  bid from 7/11  CBGs  SSI  Close PCP follow-up  Dysphagia, resolved . Secondary to stroke . On D3 thin liquids . Cough with food lead to vomiting. CXR ok.  Other Stroke Risk Factors  Advanced age  Overweight, Body mass index is 29.3 kg/m.,  recommend weight loss, diet and exercise as appropriate   No driving due to hemianopia  Other Active Problems  Constipation, abd xray ok, put on senokot, prn bisacodyl and fleets  Insomnia/day night confusion - seroquel at hs, helping  Hospital day # 26   Personally examined patient and images, and have participated in and made any corrections needed to history, physical, neuro exam,assessment and plan as stated above.  I have personally obtained the history, evaluated lab date, reviewed imaging studies and agree with radiology interpretations.    Sarina Ill, MD Stroke Neurology   A total of 25 minutes was spent for the care of this patient, spent on counseling patient and family on different diagnostic and therapeutic options, counseling and coordination of care, riskd ans benefits of management, compliance, or risk factor reduction and education.    To contact Stroke Continuity provider, please refer to [email or web address] After hours, contact General Neurology

## 2019-04-10 NOTE — Progress Notes (Signed)
OT Cancellation Note  Patient Details Name: Joe Perry MRN: [id number] DOB: 06-17-1951   Cancelled Treatment:    Reason Eval/Treat Not Completed: Other (comment)(Pt unable to fully arouse. Pt's eyes unable to stay open.)  OT to continue to follow as needed.  Ebony Hail Harold Hedge) Marsa Aris OTR/L Acute Rehabilitation Services Pager: [phone] Office: [place] 04/10/2019, 3:56 PM

## 2019-04-10 NOTE — TOC Progression Note (Signed)
Transition of Care [facility]) - Progression Note    Patient Details  Name: Joe Perry MRN: [id number] Date of Birth: 1950/09/18  Transition of Care [facility] CM/SW [place], [place] Phone Number: 04/10/2019, 10:31 AM  Clinical Narrative:   CSW met with patient's wife to provide her with letter that documents that the patient is in the hospital and unable to manage his own affairs. Patient's wife also requested to visit with the patient for a time while she was at the hospital. CSW updated wife that there was still no rehab bed available for the patient at this time.    Expected Discharge Plan: [place] Barriers to Discharge: Continued Medical Work up, SNF Pending Medicaid  Expected Discharge Plan and Services Expected Discharge Plan: [place] In-house Referral: Clinical Social Work Discharge Planning Services: NA Post Acute Care Choice: [place] Living arrangements for the past 2 months: Single Family Home                 DME Arranged: N/A DME Agency: NA     Representative spoke with at DME Agency: na HH Arranged: NA [place] Agency: NA         Social Determinants of Health (SDOH) Interventions    Readmission Risk Interventions No flowsheet data found.

## 2019-04-11 ENCOUNTER — Inpatient Hospital Stay (HOSPITAL_COMMUNITY): Payer: Medicare Other

## 2019-04-11 LAB — GLUCOSE, CAPILLARY
Glucose-Capillary: 152 mg/dL — ABNORMAL HIGH (ref 70–99)
Glucose-Capillary: 131 mg/dL — ABNORMAL HIGH (ref 70–99)
Glucose-Capillary: 108 mg/dL — ABNORMAL HIGH (ref 70–99)

## 2019-04-11 NOTE — Progress Notes (Signed)
Physical Therapy Treatment Patient Details Name: Joe Perry MRN: [id number] DOB: 02-26-1951 Today's Date: 04/11/2019    History of Present Illness 68 yo male s/p  R PCOM and R PCA occlusion s/p emergent thrombectomy on 7/6. Pt admitteed with L sided weakness, R gaze perference, L homonymous hemianopsia. 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    PT Comments    Patient seen for mobility progression. Pt presents with impaired balance, cognitive deficits, and is communicating very little verbally.  Pt requires max A +2 with RW for gait training distance of ~80 ft. Continue to progress as tolerated with anticipated d/c to SNF for further skilled PT services.    Follow Up Recommendations  SNF;Supervision/Assistance - 24 hour     Equipment Recommendations  None recommended by PT    Recommendations for Other Services       Precautions / Restrictions Precautions Precautions: Fall Precaution Comments: L hemianopsia; L inattention Restrictions Weight Bearing Restrictions: No    Mobility  Bed Mobility Overal bed mobility: Needs Assistance Bed Mobility: Supine to Sit     Supine to sit: Mod assist     General bed mobility comments: assist to bring bilat LE to EOB and to elevate trunk into sitting; cues for sequencing   Transfers Overall transfer level: Needs assistance Equipment used: Rolling walker (2 wheeled) Transfers: Sit to/from Stand Sit to Stand: Mod assist         General transfer comment: multimodal cues for safe hand placement and assistance to power up into standing   Ambulation/Gait Ambulation/Gait assistance: +2 safety/equipment;Mod assist;Max assist;+2 physical assistance Gait Distance (Feet): 80 Feet Assistive device: Rolling walker (2 wheeled) Gait Pattern/deviations: Step-through pattern;Decreased step length - left;Decreased weight shift to right;Decreased stride length Gait velocity: decreased   General Gait Details: assistance required +2 to  maintain balance and guide RW; pt with tendency to maintain R LE externally rotated and with poor clearance   Stairs             Wheelchair Mobility    Modified Rankin (Stroke Patients Only) Modified Rankin (Stroke Patients Only) Pre-Morbid Rankin Score: No symptoms Modified Rankin: Moderately severe disability     Balance Overall balance assessment: Needs assistance Sitting-balance support: Feet supported;Single extremity supported Sitting balance-[name]: Fair     Standing balance support: During functional activity;Bilateral upper extremity supported Standing balance-[name]: Poor                              Cognition Arousal/Alertness: Awake/alert Behavior During Therapy: Flat affect Overall Cognitive Status: Impaired/Different from baseline Area of Impairment: Orientation;Attention;Memory;Following commands;Safety/judgement;Problem solving                 Orientation Level: Situation;Time;Place Current Attention Level: Sustained Memory: Decreased short-term memory Following Commands: Follows one step commands inconsistently;Follows one step commands with increased time Safety/Judgement: Decreased awareness of safety;Decreased awareness of deficits   Problem Solving: Requires verbal cues;Requires tactile cues;Decreased initiation;Slow processing;Difficulty sequencing        Exercises      General Comments        Pertinent Vitals/Pain Pain Assessment: Faces Faces Pain Scale: No hurt Pain Intervention(s): Monitored during session    Home Living                      Prior Function            PT Goals (current goals can now be  found in the care plan section) Progress towards PT goals: Progressing toward goals    Frequency    Min 3X/week      PT Plan Current plan remains appropriate    Co-evaluation              AM-PAC PT "6 Clicks" Mobility   Outcome Measure  Help needed turning from your back  to your side while in a flat bed without using bedrails?: A Lot Help needed moving from lying on your back to sitting on the side of a flat bed without using bedrails?: A Lot Help needed moving to and from a bed to a chair (including a wheelchair)?: A Lot Help needed standing up from a chair using your arms (e.g., wheelchair or bedside chair)?: A Lot Help needed to walk in hospital room?: A Lot Help needed climbing 3-5 steps with a railing? : Total 6 Click Score: 11    End of Session Equipment Utilized During Treatment: Gait belt Activity Tolerance: Patient tolerated treatment well Patient left: with call bell/phone within reach;in chair;with chair alarm set Nurse Communication: Mobility status PT Visit Diagnosis: Muscle weakness (generalized) (M62.81);Hemiplegia and hemiparesis;Other abnormalities of gait and mobility (R26.89) Hemiplegia - Right/Left: Left Hemiplegia - dominant/non-dominant: Dominant Hemiplegia - caused by: Cerebral infarction     Time: 5638-9373 PT Time Calculation (min) (ACUTE ONLY): 23 min  Charges:  $Gait Training: 23-37 mins                     Earney Navy, PTA Acute Rehabilitation Services Pager: [phone] Office: [phone]     Darliss Cheney 04/11/2019, 2:48 PM

## 2019-04-11 NOTE — Plan of Care (Signed)
Progressing towards goals

## 2019-04-11 NOTE — Progress Notes (Signed)
STROKE TEAM PROGRESS NOTE   Interval history:  No one is at bedside. Patient in chair. Patient with eyes open but not interactive. No acute distress, comfortable, non verbal. Pending placement. Stable from yesterday but it appears he has declined from last month, when dr Erlinda Hong saw him he was following commands for example and these 2 days he appears less verbal, even per speech therapy notes. I am going to order labs, UA and CT head and see if anything has changed, further workup pending results.  Vitals:   04/10/19 2359 04/11/19 0344 04/11/19 0823 04/11/19 1123  BP: 110/85 109/81 111/81 91/77  Pulse: (!) 103 (!) 49 90 99  Resp: 17 16 20 16   Temp: 99.4 F (37.4 C)   99.3 F (37.4 C)  TempSrc: Oral   Axillary  SpO2: 100% (!) 78% 96% 97%  Weight:      Height:        CBC:  Recent Labs  Lab 04/05/19 0423  WBC 7.8  HGB 14.4  HCT 42.8  MCV 92.0  PLT 132    Basic Metabolic Panel:  Recent Labs  Lab 04/05/19 0423  NA 138  K 4.4  CL 103  CO2 22  GLUCOSE 113*  BUN 32*  CREATININE 1.00  CALCIUM 9.8    PHYSICAL EXAM    GEN: NAD, pleasant, cooperative CVS: RRR, no carotid bruit CHEST: No signs of resp distress, on room air ABD: Soft, NTTP  NEURO:  MENTAL STATUS: Awake, alert, non verbal LANG/SPEECH: dysarthric, aphasia, not following commands.   CRANIAL NERVES:  II:  Does not blink to threat on the left III, IV, VI: conjugate midline gaze, tracks but does not cross the midline to the left V: normal  VII: no facial asymmetry  VIII:  Appears to hear me when speaking in normal ton MOTOR: difficult exam appears to be antigravity without drift on leftarm and anti-gravity with drift right arm but not cooperative Reflex: Toes downgoing  SENSORY: withdraws in all extremities   ASSESSMENT/PLAN Joe Perry is a 68 y.o. 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 Old R frontal infarct.    CTA head & neck - R P1 occlusion. Small BA w/ high grade atheromatous narrowing. L ICA siphon stenosis.  CT perfusion 19 cc penumbra  Cerebral angio TICI 2b revascularization occluded R PCOM and P PCA. Underlying large vessel atherosclerosis   MRI Rt PCA infarct    2D Echo EF 60 to 65%.   Recommend 30-day OP monitoring to rule out A. fib   HIV neg  LDL 101   HgbA1c 9.8  P2Y12 90  SCDs for VTE prophylaxis  aspirin 81 mg daily and clopidogrel 75 mg daily prior to admission, put on aspirin 325 mg daily and clopidogrel 75 mg daily. However, developed itchiness, plavix was discontinued. Now on ASA 81 and Brilinta 90 bid. Continue on discharge.  Therapy recommendations:  SNF  Disposition:  Pending -  MCD pending   Repeat labs weekly - next scheduled for Sat 04/12/19, will also order UA and CT Head due to slow decline in cognition.  Hx stroke/TIA  11/2018 - presented for dizziness and unsteady, MRI showed right frontal and parietal small white matter infarcts. No LVO. Extensive SVD.  Put on DAPT.  Patient signed [name]  01/2016 - admitted for gait disability.  MRI negative for acute infarct.  Old infarcts B BG, thalami, L cerebellum.   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 However, pt developed vomiting episodes x 2 days. Improved after off abilify.  Hemichorea movement slowly improving  Hypertension  Home meds:  None listed  Stable on the low side   Continue Lotensin to 5mg  daily . BP goal normotensive . monitor  Hyperlipidemia  Home meds:  No statin listed  LDL 101, goal < 70  AST and ALT normalized   Continue Lipitor 40  Continue statin on discharge  Diabetes type II,  uncontrolled  Home meds:  glucophage 1000 bid  HgbA1c 9.8, goal < 7.0  Hyperglycemia much improved  On metformin 1000mg  bid from 7/11  CBGs  SSI  Close PCP follow-up  Dysphagia, resolved . Secondary to stroke . On D3 thin liquids . Cough with food lead to vomiting. CXR ok.  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 I am going to order labs, UA and CT head and see if anything has changed, further workup pending results.  Hospital day # 32   Stable from yesterday but it appears he has declined from last month, when dr Erlinda Hong saw him he was following commands for example and these 2 days he appears less verbal, even per speech therapy notes. I am going to order labs, UA and CT head and see if anything has changed, further workup pending results.  Personally examined patient and images, and have participated in and made any corrections needed to history, physical, neuro exam,assessment and plan as stated above.  I have personally obtained the history, evaluated lab date, reviewed imaging studies and agree with radiology interpretations.    Sarina Ill, MD Stroke Neurology   A total of 25 minutes was spent for the care of this patient, spent on counseling patient and family on different diagnostic and therapeutic options, counseling and coordination of care, riskd ans benefits of management, compliance, or risk factor reduction and education.    To contact Stroke Continuity provider,  please refer to [email or web address] After hours, contact General Neurology

## 2019-04-11 NOTE — Progress Notes (Signed)
  Speech Language Pathology Treatment: Cognitive-Linquistic  Patient Details Name: Joe Perry MRN: [id number] DOB: 18-Aug-1951 Today's Date: 04/11/2019 Time: 1141-1200 SLP Time Calculation (min) (ACUTE ONLY): 19 min  Assessment / Plan / Recommendation Clinical Impression  Pt was seen for treatment but his participation has demonstrated a progressive decline with notable reduction in verbal output and speech intelligibility compared to when he was initially seen by this SLP on 04/01/19 but possibly similar to his performance during the intial evaluation on 03/11/19. Pt's speech intelligibility was reduced secondary to articulatory imprecision and reduced vocal intensity. He was not responsive to prompting for use of an increased vocal intensity despite cues. He did not demonstrate any confrontational naming or simple phrase completion during the session despite max cues. He followed two 1-step commands during repositioning when tactile cues were provided but did not respond to any yes/no questions. SLP will continue to follow pt and encourage his participation.    HPI HPI: 68 year old male admitted 03/10/2019 with left weakness, unsteady gait, right gaze, left hemianopsia. PMH: CVA, HTN, DM. CT negative. MRI Ischemic infarct of the right posterior cerebral artery, territory, roughly corresponding to the ischemic volume.      SLP Plan  Continue with current plan of care       Recommendations                   Oral Care Recommendations: Oral care BID Follow up Recommendations: Skilled Nursing facility SLP Visit Diagnosis: Cognitive communication deficit (R41.841);Aphasia (R47.01);Dysphagia, unspecified (R13.10) Plan: Continue with current plan of care       Penne Rosenstock I. Hardin Negus, [place], [place] Office number [phone] Pager [place] 04/11/2019, 12:09 PM

## 2019-04-12 DIAGNOSIS — I10 Essential (primary) hypertension: Secondary | ICD-10-CM

## 2019-04-12 DIAGNOSIS — E119 Type 2 diabetes mellitus without complications: Secondary | ICD-10-CM

## 2019-04-12 LAB — GLUCOSE, CAPILLARY
Glucose-Capillary: 108 mg/dL — ABNORMAL HIGH (ref 70–99)
Glucose-Capillary: 116 mg/dL — ABNORMAL HIGH (ref 70–99)
Glucose-Capillary: 134 mg/dL — ABNORMAL HIGH (ref 70–99)
Glucose-Capillary: 136 mg/dL — ABNORMAL HIGH (ref 70–99)
Glucose-Capillary: 93 mg/dL (ref 70–99)

## 2019-04-12 LAB — CBC
HCT: 38.7 % — ABNORMAL LOW (ref 39.0–52.0)
Hemoglobin: 12.8 g/dL — ABNORMAL LOW (ref 13.0–17.0)
MCH: 30.8 pg (ref 26.0–34.0)
MCHC: 33.1 g/dL (ref 30.0–36.0)
MCV: 93.3 fL (ref 80.0–100.0)
Platelets: 220 10*3/uL (ref 150–400)
RBC: 4.15 MIL/uL — ABNORMAL LOW (ref 4.22–5.81)
RDW: 12.8 % (ref 11.5–15.5)
WBC: 9.5 10*3/uL (ref 4.0–10.5)
nRBC: 0 % (ref 0.0–0.2)

## 2019-04-12 LAB — COMPREHENSIVE METABOLIC PANEL
ALT: 25 U/L (ref 0–44)
AST: 17 U/L (ref 15–41)
Albumin: 3.4 g/dL — ABNORMAL LOW (ref 3.5–5.0)
Alkaline Phosphatase: 69 U/L (ref 38–126)
Anion gap: 11 (ref 5–15)
BUN: 37 mg/dL — ABNORMAL HIGH (ref 8–23)
CO2: 24 mmol/L (ref 22–32)
Calcium: 10 mg/dL (ref 8.9–10.3)
Chloride: 105 mmol/L (ref 98–111)
Creatinine, Ser: 1.44 mg/dL — ABNORMAL HIGH (ref 0.61–1.24)
GFR calc Af Amer: 59 mL/min — ABNORMAL LOW (ref >60)
GFR calc non Af Amer: 51 mL/min — ABNORMAL LOW (ref >60)
Glucose, Bld: 134 mg/dL — ABNORMAL HIGH (ref 70–99)
Potassium: 4.1 mmol/L (ref 3.5–5.1)
Sodium: 140 mmol/L (ref 135–145)
Total Bilirubin: 0.7 mg/dL (ref 0.3–1.2)
Total Protein: 6.9 g/dL (ref 6.5–8.1)

## 2019-04-12 MED ORDER — SODIUM CHLORIDE 0.9 % IV BOLUS
250.0000 mL | Freq: Once | INTRAVENOUS | Status: AC
  Administered 2019-04-12: 23:00:00 250 mL via INTRAVENOUS

## 2019-04-12 MED ORDER — SODIUM CHLORIDE 0.9 % IR SOLN: 1000.0000 mL | Status: DC

## 2019-04-12 MED ORDER — SODIUM CHLORIDE 0.9 % IV SOLN
INTRAVENOUS | Status: DC
  Administered 2019-04-12 – 2019-04-17 (×8): via INTRAVENOUS

## 2019-04-12 NOTE — Plan of Care (Signed)
Progressing towards goals

## 2019-04-12 NOTE — Progress Notes (Signed)
STROKE TEAM PROGRESS NOTE   Interval history:  Wife is at bedside today.  Patient in bed.  Patient alert but not answer questions and not following commands.  However I believe this is due to noncooperation now since patient told me his name on command after his wife became upset that he would not cooperate with me and slightly berated him.  So I do not think that he has had a change in mental status I believe he is just not interested in following a commands or answering her questions.   PRIOR: No one is at bedside. Patient in chair. Patient with eyes open but not interactive. No acute distress, comfortable, non verbal. Pending placement. Stable from yesterday but it appears he has declined from last month, when dr Erlinda Hong saw him he was following commands for example and these 2 days he appears less verbal, even per speech therapy notes. I am going to order labs, UA and CT head and see if anything has changed, further workup pending results.  Vitals:   04/12/19 0400 04/12/19 0500 04/12/19 0848 04/12/19 1132  BP: (!) 77/54 111/69 95/66 129/66  Pulse: 91  62 76  Resp: 16  15 20   Temp: 98 F (36.7 C)  97.6 F (36.4 C) 97.6 F (36.4 C)  TempSrc: Axillary  Oral Oral  SpO2: 96%  98% 100%  Weight:      Height:        CBC:  Recent Labs  Lab 04/12/19 0645  WBC 9.5  HGB 12.8*  HCT 38.7*  MCV 93.3  PLT 300    Basic Metabolic Panel:  Recent Labs  Lab 04/12/19 0645  NA 140  K 4.1  CL 105  CO2 24  GLUCOSE 134*  BUN 37*  CREATININE 1.44*  CALCIUM 10.0    IMAGING  CT Head WO Contrast 04/11/2019 IMPRESSION: Evolving infarct in the right PCA territory. No progression since prior studies. No acute hemorrhage Atrophy and extensive chronic  vessel ischemia.   PHYSICAL EXAM    GEN: NAD, pleasant, cooperative CVS: RRR, no carotid bruit CHEST: No signs of resp distress, on room air ABD: Soft, NTTP  NEURO:  MENTAL STATUS: Awake, alert, states his name when wife becomes upset at  him that he is not trying to interact with me when I asked him his name or ask him to raise and arm.  Noncooperation. LANG/SPEECH: dysarthric, aphasia, not following commands.   CRANIAL NERVES:  II:  Does not blink to threat on the left III, IV, VI: conjugate midline gaze, tracks but does not cross the midline to the left V: normal  VII: no facial asymmetry  VIII:  Appears to hear me when speaking in normal ton MOTOR: difficult exam appears to be antigravity without drift on leftarm and anti-gravity with drift right arm but not cooperative Reflex: Toes downgoing  SENSORY: withdraws in all extremities   ASSESSMENT/PLAN [name] is a 68 y.o. 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 Old R frontal infarct.    CTA head & neck - R P1 occlusion. Small BA w/ high grade atheromatous narrowing. L ICA siphon stenosis.  CT perfusion 19 cc penumbra  CT Head - 04/11/2019 - Evolving infarct in the right PCA territory. No progression since prior  studies. No acute hemorrhage  Cerebral angio TICI 2b revascularization occluded R PCOM and P PCA. Underlying large vessel atherosclerosis   MRI Rt PCA infarct   U/A and culture - pending   2D Echo EF 60 to 65%.   Recommend 30-day OP monitoring to rule out A. fib   HIV neg  LDL 101   HgbA1c 9.8  P2Y12 90  SCDs for VTE prophylaxis  aspirin 81 mg daily and clopidogrel 75 mg daily prior to admission, put on aspirin 325 mg daily and clopidogrel 75 mg daily. However, developed itchiness, plavix was discontinued. Now on ASA 81 and Brilinta 90 bid. Continue on discharge.  Therapy recommendations:  SNF  Disposition:  Pending -  MCD pending   Repeat labs weekly - next scheduled for Sat 04/12/19, will also order UA and CT Head due to slow decline  in cognition.  Hx stroke/TIA  11/2018 - presented for dizziness and unsteady, MRI showed right frontal and parietal small white matter infarcts. No LVO. Extensive SVD.  Put on DAPT.  Patient signed [facility]  01/2016 - admitted for gait disability.  MRI negative for acute infarct.  Old infarcts B BG, thalami, L cerebellum.   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 However, pt developed vomiting episodes x 2 days. Improved after off abilify.  Hemichorea movement slowly improving  Hypertension  Home meds:  None listed  Stable on the low side   Continue Lotensin to 5mg  daily . BP goal normotensive . monitor  Hyperlipidemia  Home meds:  No statin listed  LDL 101, goal < 70  AST and ALT normalized   Continue Lipitor 40  Continue statin on discharge  Diabetes type II, uncontrolled  Home meds:  glucophage 1000 bid  HgbA1c 9.8, goal < 7.0  Hyperglycemia much improved  On metformin 1000mg  bid from 7/11  CBGs  SSI  Close PCP follow-up  Dysphagia, resolved . Secondary to stroke . On D3 thin liquids . Cough with food lead to vomiting. CXR ok.  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 I am going to order labs, UA and CT head and see if anything has changed, further workup pending results.  Creatinine - 1.44 - ?  Dehydrated (stopping Lotensin may help creatinine) - will give 250 cc NS bolus and start IV NS at 75 cc/hr - recheck in AM  WBC - 9.5 (afebrile)  BP occasionally low - 77/54 - will D/C [facility] day # 33   Yesterday I thought possibly there was a change in patient's exam because he would not cooperate with me, he would not follow any commands or try to answer any questions which he was doing for my prior colleagues.  But today wife is at bed and patient is more interactive and says his name after his wife is upset that he will  interact with me and berates him slightly.  This is more behavioral.  Personally examined patient and images, and have participated in and made any corrections needed to history, physical, neuro exam,assessment and plan as stated above.  I have personally obtained the history, evaluated lab date, reviewed imaging studies and agree with radiology interpretations.    A total of 15 minutes was spent for the care of this patient, spent on counseling patient and family on different diagnostic and therapeutic options, counseling and coordination of care, riskd ans benefits of management, compliance, or risk factor reduction and education.    To contact Stroke Continuity provider, please refer to [email or web address] After hours, contact General Neurology

## 2019-04-13 LAB — BASIC METABOLIC PANEL
Anion gap: 12 (ref 5–15)
BUN: 33 mg/dL — ABNORMAL HIGH (ref 8–23)
CO2: 24 mmol/L (ref 22–32)
Calcium: 9.9 mg/dL (ref 8.9–10.3)
Chloride: 104 mmol/L (ref 98–111)
Creatinine, Ser: 1.17 mg/dL (ref 0.61–1.24)
GFR calc Af Amer: >60 mL/min (ref >60)
GFR calc non Af Amer: >60 mL/min (ref >60)
Glucose, Bld: 114 mg/dL — ABNORMAL HIGH (ref 70–99)
Potassium: 3.9 mmol/L (ref 3.5–5.1)
Sodium: 140 mmol/L (ref 135–145)

## 2019-04-13 LAB — URINALYSIS, ROUTINE W REFLEX MICROSCOPIC
Bilirubin Urine: NEGATIVE
Glucose, UA: NEGATIVE mg/dL
Ketones, ur: NEGATIVE mg/dL
Nitrite: NEGATIVE
Protein, ur: NEGATIVE mg/dL
Specific Gravity, Urine: 1.021 (ref 1.005–1.030)
WBC, UA: >50 WBC/hpf — ABNORMAL HIGH (ref 0–5)
pH: 5 (ref 5.0–8.0)

## 2019-04-13 LAB — CBC
HCT: 37.5 % — ABNORMAL LOW (ref 39.0–52.0)
Hemoglobin: 12.5 g/dL — ABNORMAL LOW (ref 13.0–17.0)
MCH: 31.1 pg (ref 26.0–34.0)
MCHC: 33.3 g/dL (ref 30.0–36.0)
MCV: 93.3 fL (ref 80.0–100.0)
Platelets: 220 10*3/uL (ref 150–400)
RBC: 4.02 MIL/uL — ABNORMAL LOW (ref 4.22–5.81)
RDW: 12.6 % (ref 11.5–15.5)
WBC: 6.5 10*3/uL (ref 4.0–10.5)
nRBC: 0 % (ref 0.0–0.2)

## 2019-04-13 LAB — URINALYSIS, COMPLETE (UACMP) WITH MICROSCOPIC
Bilirubin Urine: NEGATIVE
Glucose, UA: NEGATIVE mg/dL
Hgb urine dipstick: NEGATIVE
Ketones, ur: NEGATIVE mg/dL
Leukocytes,Ua: NEGATIVE
Nitrite: NEGATIVE
Protein, ur: NEGATIVE mg/dL
RBC / HPF: NONE SEEN RBC/hpf (ref 0–5)
Squamous Epithelial / HPF: NONE SEEN (ref 0–5)

## 2019-04-13 LAB — GLUCOSE, CAPILLARY
Glucose-Capillary: 95 mg/dL (ref 70–99)
Glucose-Capillary: 111 mg/dL — ABNORMAL HIGH (ref 70–99)
Glucose-Capillary: 126 mg/dL — ABNORMAL HIGH (ref 70–99)
Glucose-Capillary: 105 mg/dL — ABNORMAL HIGH (ref 70–99)

## 2019-04-13 MED ORDER — SODIUM CHLORIDE 0.9 % IV SOLN
1.0000 g | INTRAVENOUS | Status: AC
  Administered 2019-04-13 – 2019-04-15 (×3): 1 g via INTRAVENOUS
  Filled 2019-04-13 (×3): qty 10

## 2019-04-13 NOTE — Plan of Care (Signed)
Progressing towards goals

## 2019-04-13 NOTE — Progress Notes (Signed)
Occupational Therapy Treatment Patient Details Name: Joe Perry MRN: [id number] DOB: Dec 17, 1950 Today's Date: 04/13/2019    History of present illness 68 yo male s/p  R PCOM and R PCA occlusion s/p emergent thrombectomy on 7/6. Pt admitteed with L sided weakness, R gaze perference, L homonymous hemianopsia. 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   OT comments  Pt. Seen for skilled OT treatment session.  Bed level peri care, also work with bed mobility in preparation for increasing functional mobility. Note at this time pt. Requiring 2 person assist with some cognitive changes also.    OTR/L to reassess patient and goals at next session.     Follow Up Recommendations  SNF;Supervision/Assistance - 24 hour    Equipment Recommendations       Recommendations for Other Services Rehab consult    Precautions / Restrictions Precautions Precautions: Fall Precaution Comments: L hemianopsia; L inattention       Mobility Bed Mobility Overal bed mobility: Needs Assistance             General bed mobility comments: max a to scoot up in bed, min/mod a to initiate rolling L/R for bedding change after incontent episode.  Transfers                      Balance                                           ADL either performed or assessed with clinical judgement   ADL Overall ADL's : Needs assistance/impaired             Lower Body Bathing: Set up;Bed level Lower Body Bathing Details (indicate cue type and reason): able to perform pericare supine with set up after urinary incontence Upper Body Dressing : Minimal assistance;Bed level;Cueing for sequencing Upper Body Dressing Details (indicate cue type and reason): don/doff gown                   General ADL Comments: multiple multimodal cues for any functional task     Vision       Perception     Praxis      Cognition Arousal/Alertness: Awake/alert Behavior During Therapy: Flat  affect Overall Cognitive Status: Impaired/Different from baseline                                 General Comments: mumbling in between questions/answers rn reports it is behavorial as he can "stop" when told to?Marland Kitchen..answers yes no appropriately but with increased time        Exercises     Shoulder Instructions       General Comments      Pertinent Vitals/ Pain       Pain Assessment: No/denies pain  Home Living                                          Prior Functioning/Environment              Frequency  Min 2X/week        Progress Toward Goals  OT Goals(current goals can now be found in the care plan section)  Progress towards OT goals: Progressing  toward goals     Plan Discharge plan remains appropriate    Co-evaluation                 AM-PAC OT "6 Clicks" Daily Activity     Outcome Measure   Help from another person eating meals?: A Little Help from another person taking care of personal grooming?: A Little Help from another person toileting, which includes using toliet, bedpan, or urinal?: A Lot Help from another person bathing (including washing, rinsing, drying)?: A Little Help from another person to put on and taking off regular upper body clothing?: A Little Help from another person to put on and taking off regular lower body clothing?: A Little 6 Click Score: 17    End of Session    OT Visit Diagnosis: Unsteadiness on feet (R26.81);Muscle weakness (generalized) (M62.81)   Activity Tolerance Patient tolerated treatment well   Patient Left in bed;with call bell/phone within reach;with bed alarm set   Nurse Communication Other (comment)(spoke to rn and cna, bed function remote not staying attched to bottom of bed for use, also hob function not working allowing hob to raise or lower. also notified catheter is off of pt. and he and the bed required clean up)        Time: 1203-1220 OT Time Calculation  (min): 17 min  Charges: OT General Charges $OT Visit: 1 Visit OT Treatments $Self Care/Home Management : 8-22 mins   Janice Coffin, COTA/L 04/13/2019, 12:47 PM

## 2019-04-13 NOTE — Progress Notes (Signed)
STROKE TEAM PROGRESS NOTE   Interval history:  Patient more pleasant today, more interactive, says his name and follows simple commands.  Vitals:   04/12/19 1132 04/12/19 1523 04/12/19 2003 04/13/19 0047  BP: 129/66 (!) 154/86 98/67 113/67  Pulse: 76 84 91 60  Resp: 20 20 16 16   Temp: 97.6 F (36.4 C) 97.6 F (36.4 C) 98.1 F (36.7 C) 98.2 F (36.8 C)  TempSrc: Oral Oral Oral Oral  SpO2: 100% 99% 99% 100%  Weight:      Height:        CBC:  Recent Labs  Lab 04/12/19 0645 04/13/19 0437  WBC 9.5 6.5  HGB 12.8* 12.5*  HCT 38.7* 37.5*  MCV 93.3 93.3  PLT 220 573    Basic Metabolic Panel:  Recent Labs  Lab 04/12/19 0645 04/13/19 0437  NA 140 140  K 4.1 3.9  CL 105 104  CO2 24 24  GLUCOSE 134* 114*  BUN 37* 33*  CREATININE 1.44* 1.17  CALCIUM 10.0 9.9   IMAGING  CT Head WO Contrast  8/72020 PRESSION: Evolving infarct in the right PCA territory. No progression since prior studies. No acute hemorrhage Atrophy and extensive chronic  vessel ischemia.  PHYSICAL EXAM    GEN: NAD, pleasant, cooperative CVS: RRR, no carotid bruit CHEST: No signs of resp distress, on room air ABD: Soft, NTTP  NEURO:  MENTAL STATUS: Awake, alert LANG/SPEECH: dysarthric, aphasia, more interactive, follows some commands and says his name.   CRANIAL NERVES:  II:  Does not blink to threat on the left III, IV, VI: conjugate midline gaze, tracks but does not cross the midline to the left V: normal  VII: no facial asymmetry  VIII:  Appears to hear me when speaking in normal tone MOTOR: difficult exam appears to be antigravity without drift on leftarm and anti-gravity with drift right arm but not cooperative Reflex: Toes downgoing  SENSORY: withdraws in all extremities   ASSESSMENT/PLAN Joe Perry is a 68 y.o. 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 Old R frontal infarct.    CTA head & neck - R P1 occlusion. Small BA w/ high grade atheromatous narrowing. L ICA siphon stenosis.  CT perfusion 19 cc penumbra  CT WO Contrast - 04/11/2019 - Evolving infarct in the right PCA territory. No progression since prior studies. No acute hemorrhage  Cerebral angio TICI 2b revascularization occluded R PCOM and P PCA. Underlying large vessel atherosclerosis   MRI Rt PCA infarct    2D Echo EF 60 to 65%.   Recommend 30-day OP monitoring to rule out A. fib   HIV neg  LDL 101   HgbA1c 9.8  P2Y12 90  SCDs for VTE prophylaxis  aspirin 81 mg daily and clopidogrel 75 mg daily prior to admission, put on aspirin 325 mg daily and clopidogrel 75 mg daily. However, developed itchiness, plavix was discontinued. Now on ASA 81 and Brilinta 90 bid. Continue on discharge.  Therapy recommendations:  SNF  Disposition:  Pending -  MCD pending   Repeat labs weekly - next scheduled for Sat 04/12/19, will also order UA and CT Head due to slow decline in cognition.  Hx stroke/TIA  11/2018 - presented for dizziness and unsteady, MRI showed right frontal and parietal  small white matter infarcts. No LVO. Extensive SVD.  Put on DAPT.  Patient signed [facility]  01/2016 - admitted for gait disability.  MRI negative for acute infarct.  Old infarcts B BG, thalami, L cerebellum.   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 However, pt developed vomiting episodes x 2 days. Improved after off abilify.  Hemichorea movement slowly improving  Hypertension  Home meds:  None listed  Stable on the low side   Continue Lotensin to 5mg  daily . BP goal normotensive . monitor  Hyperlipidemia  Home meds:  No statin listed  LDL 101,  goal < 70  AST and ALT normalized   Continue Lipitor 40  Continue statin on discharge  Diabetes type II, uncontrolled  Home meds:  glucophage 1000 bid  HgbA1c 9.8, goal < 7.0  Hyperglycemia much improved  On metformin 1000mg  bid from 7/11  CBGs  SSI  Close PCP follow-up  Dysphagia, resolved . Secondary to stroke . On D3 thin liquids . Cough with food lead to vomiting. CXR ok.  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 I am going to order labs, UA and CT head and see if anything has changed, further workup pending results.  CT Head 04/11/19 - stable ; U/A - possible UTI -> IV Rocephin    Hospital day # 45   Personally examined patient and images, and have participated in and made any corrections needed to history, physical, neuro exam,assessment and plan as stated above.  I have personally obtained the history, evaluated lab date, reviewed imaging studies and agree with radiology interpretations.   A total of 15 minutes was spent for the care of this patient, spent on counseling patient and family on different diagnostic and therapeutic options, counseling and coordination of care, riskd ans benefits of management, compliance, or risk factor reduction and education.    To contact Stroke Continuity provider, please refer to [email or web address] After hours, contact General Neurology

## 2019-04-14 DIAGNOSIS — I6340 Cerebral infarction due to embolism of unspecified cerebral artery: Secondary | ICD-10-CM

## 2019-04-14 DIAGNOSIS — N3000 Acute cystitis without hematuria: Secondary | ICD-10-CM

## 2019-04-14 LAB — GLUCOSE, CAPILLARY
Glucose-Capillary: 82 mg/dL (ref 70–99)
Glucose-Capillary: 89 mg/dL (ref 70–99)
Glucose-Capillary: 90 mg/dL (ref 70–99)

## 2019-04-14 LAB — URINE CULTURE

## 2019-04-14 LAB — BASIC METABOLIC PANEL
Anion gap: 12 (ref 5–15)
BUN: 21 mg/dL (ref 8–23)
CO2: 23 mmol/L (ref 22–32)
Calcium: 9.5 mg/dL (ref 8.9–10.3)
Chloride: 107 mmol/L (ref 98–111)
Creatinine, Ser: 0.96 mg/dL (ref 0.61–1.24)
GFR calc Af Amer: >60 mL/min (ref >60)
GFR calc non Af Amer: >60 mL/min (ref >60)
Glucose, Bld: 101 mg/dL — ABNORMAL HIGH (ref 70–99)
Potassium: 4 mmol/L (ref 3.5–5.1)
Sodium: 142 mmol/L (ref 135–145)

## 2019-04-14 LAB — CBC
HCT: 38.6 % — ABNORMAL LOW (ref 39.0–52.0)
Hemoglobin: 12.6 g/dL — ABNORMAL LOW (ref 13.0–17.0)
MCH: 31 pg (ref 26.0–34.0)
MCHC: 32.6 g/dL (ref 30.0–36.0)
MCV: 94.8 fL (ref 80.0–100.0)
Platelets: 233 10*3/uL (ref 150–400)
RBC: 4.07 MIL/uL — ABNORMAL LOW (ref 4.22–5.81)
RDW: 12.5 % (ref 11.5–15.5)
WBC: 6.8 10*3/uL (ref 4.0–10.5)
nRBC: 0 % (ref 0.0–0.2)

## 2019-04-14 NOTE — Progress Notes (Signed)
Occupational Therapy Progress Note   Pt moved to EOB with mod A.  While EOB, pt able to tell me his name and that he is at "[facility]", and was able to make eye contact.  Worked on self feeding with him.  He initially was able to feed himself a bite of food, but then began to perseverate on scooping food (without success) using Rt UE, and was unable to extinguish behavior or follow commands.  Also noted to perseverate on chewing.  He was able to take 2 sips of juice with max A, then demonstrated decreased interactions again with continued perseverative behaviors.  Lt UE and trunk with rhythmic, chorea like movements.  Overall, he required max A  For self feeding.  Chart reviewed.  Pt has had a significant decline in status since time of initial evaluation.  Goals downgraded to meet pt's current level of functioning - Discussed observations with MD.     04/14/19 1000  OT Visit Information  Last OT Received On 04/14/19  Assistance Needed +2  History of Present Illness 68 yo male s/p  R PCOM and R PCA occlusion s/p emergent thrombectomy on 7/6. Pt admitteed with L sided weakness, R gaze perference, L homonymous hemianopsia. 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  Precautions  Precautions Fall  Precaution Comments L hemianopsia; L inattention; confused, poor attention, perseverative   Pain Assessment  Pain Assessment Faces  Faces Pain Scale 0  Cognition  Arousal/Alertness Awake/alert  Behavior During Therapy Flat affect;Restless  Overall Cognitive Status Impaired/Different from baseline  Area of Impairment Orientation;Attention;Following commands  Orientation Level Disoriented to;Time;Situation  Current Attention Level Focused;Sustained  Following Commands Follows one step commands with increased time;Follows one step commands inconsistently  General Comments On entry pt in sidelying with rhythmic movements of Lt UE - appears to be either trying to push of mitten, or that his condom cath is causing  irritation. When cued to stop, he does not.  He initially told me his name is Joe Kitchen, then after moving to EOB, told me his name is "Joe Perry".  He was able to tell me he is at [facility].  He made eye contact, indicated he was hungry.  Engaged pt in self feeding.  he initially attempted to scoop food onto plate, then began to perseverate on scooping food without success, and with no awareness.  Rhythmic movement of Lt UE continued as well as rhythmic movement of trunk.  Pt with perseverative chewing, and decreased ability to follow commands.  with max prompting, he will at times look to OT.    Upper Extremity Assessment  Upper Extremity Assessment RUE deficits/detail;LUE deficits/detail  RUE Deficits / Details AROM appears WFL.  He, however, perseverated on scooping syrup and food onto spoon, and was unable to extinguish behavior   LUE Deficits / Details  Pt with rythmic, chorea like movements of Lt UE and hand.  He spontaneously moved it toward the left side of his neck and perseverated on rubbing.    LUE Coordination decreased fine motor;decreased gross motor  Lower Extremity Assessment  Lower Extremity Assessment Defer to PT evaluation  ADL  Overall ADL's  Needs assistance/impaired  Eating/Feeding Maximal assistance;Cueing for sequencing;Sitting  Eating/Feeding Details (indicate cue type and reason) Pt initially able to scoop a bite of food and bring it to his mouth with supervision, but then he began to perseverate on scooping syrup and food and was unable to extinguish behavior despite max cues.  He required hand over hand  assist to self feed another bite.  He however, began to perseverate on chewing with no swallow initiated, and unable to follow commands to open mouth.  he drank x 2 from cup with max hand over hand assist   Lucille Passy, OTR/L [place] Pager [phone] Office [phone]

## 2019-04-14 NOTE — Progress Notes (Signed)
Physical Therapy Treatment Patient Details Name: Joe Perry MRN: [id number] DOB: 13-Dec-1950 Today's Date: 04/14/2019    History of Present Illness 68 yo male s/p  R PCOM and R PCA occlusion s/p emergent thrombectomy on 7/6. Pt admitteed with L sided weakness, R gaze perference, L homonymous hemianopsia. 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    PT Comments    Found pt in bed legs over the rail with alternating in a waving pattern.  Pt needing multimodal cuing to progress through tasks and he does not initiate movement to VC only.  Attention was limited, needing frequent redirection.  Emphasis on gait and challenge to balance.    Follow Up Recommendations  SNF;Supervision/Assistance - 24 hour     Equipment Recommendations  None recommended by PT    Recommendations for Other Services Rehab consult     Precautions / Restrictions Precautions Precautions: Fall Precaution Comments: L hemianopsia; L inattention; confused, poor attention, perseverative     Mobility  Bed Mobility Overal bed mobility: Needs Assistance Bed Mobility: Supine to Sit;Sit to Supine     Supine to sit: Max assist Sit to supine: Mod assist   General bed mobility comments: pt did not initiate to VC, but needed tactile cues in addition  Transfers Overall transfer level: Needs assistance   Transfers: Sit to/from Stand Sit to Stand: Mod assist         General transfer comment: stability assist  Ambulation/Gait Ambulation/Gait assistance: Mod assist;+2 safety/equipment Gait Distance (Feet): 170 Feet Assistive device: 1 person hand held assist;IV Pole Gait Pattern/deviations: Step-through pattern     General Gait Details: Significant stability assist given through 1 or both UE's.  pt with narrowed BOS and tendency for R LE to adduct L across midline.   Stairs             Wheelchair Mobility    Modified Rankin (Stroke Patients Only) Modified Rankin (Stroke Patients  Only) Pre-Morbid Rankin Score: No symptoms Modified Rankin: Moderately severe disability     Balance Overall balance assessment: Needs assistance Sitting-balance support: Feet supported;Single extremity supported Sitting balance-[name]: Fair Sitting balance - Comments: list forward but without loss of control   Standing balance support: During functional activity Standing balance-[name]: Poor Standing balance comment: reliant on external support                            Cognition Arousal/Alertness: Awake/alert Behavior During Therapy: Flat affect;Restless Overall Cognitive Status: Impaired/Different from baseline Area of Impairment: Orientation;Attention;Following commands                 Orientation Level: Situation Current Attention Level: Focused;Sustained Memory: Decreased short-term memory Following Commands: Follows one step commands inconsistently;Follows one step commands with increased time Safety/Judgement: Decreased awareness of safety;Decreased awareness of deficits Awareness: Intellectual Problem Solving: Requires verbal cues;Requires tactile cues;Decreased initiation;Slow processing;Difficulty sequencing General Comments: rhythmic movement of UE's on arrival      Exercises      General Comments General comments (skin integrity, edema, etc.): Discussed observations with MD       Pertinent Vitals/Pain Pain Assessment: Faces Faces Pain Scale: No hurt Pain Intervention(s): Monitored during session    Home Living                      Prior Function            PT Goals (current goals can now be found in  the care plan section) Acute Rehab PT Goals Patient Stated Goal: Pt unable to state  PT Goal Formulation: With patient Time For Goal Achievement: 04/28/19 Progress towards PT goals: Progressing toward goals(goals updated)    Frequency    Min 3X/week      PT Plan Current plan remains appropriate     Co-evaluation              AM-PAC PT "6 Clicks" Mobility   Outcome Measure  Help needed turning from your back to your side while in a flat bed without using bedrails?: A Little Help needed moving from lying on your back to sitting on the side of a flat bed without using bedrails?: A Little Help needed moving to and from a bed to a chair (including a wheelchair)?: A Lot Help needed standing up from a chair using your arms (e.g., wheelchair or bedside chair)?: A Lot Help needed to walk in hospital room?: A Lot Help needed climbing 3-5 steps with a railing? : A Lot 6 Click Score: 14    End of Session   Activity Tolerance: Patient tolerated treatment well Patient left: in bed;with call bell/phone within reach;with bed alarm set Nurse Communication: Mobility status PT Visit Diagnosis: Muscle weakness (generalized) (M62.81);Hemiplegia and hemiparesis;Other abnormalities of gait and mobility (R26.89) Hemiplegia - Right/Left: Left Hemiplegia - dominant/non-dominant: Dominant Hemiplegia - caused by: Cerebral infarction     Time: 2563-8937 PT Time Calculation (min) (ACUTE ONLY): 23 min  Charges:  $Gait Training: 8-22 mins $Therapeutic Activity: 8-22 mins                     04/14/2019  Donnella Sham, PT Acute Rehabilitation Services [phone]  (pager) [phone]  (office)   Joe Perry 04/14/2019, 5:27 PM

## 2019-04-14 NOTE — Progress Notes (Signed)
STROKE TEAM PROGRESS NOTE   Interval history:  Pt sitting in bed, left hemichorea much improved. However, confused and not orientated, difficulty answering questions with perseveration. OT saw pt today, reported repetitive movement of right hand during lunch feeding himself and also mental slowing and incoherent, concerning for seizure activity. Will do EEG.   Vitals:   04/14/19 0120 04/14/19 0444 04/14/19 0800 04/14/19 1218  BP: (!) 153/89 (!) 148/87 121/72 (!) 141/117  Pulse: 92 92  100  Resp: 15 15 15 18   Temp: 97.6 F (36.4 C) 97.8 F (36.6 C) 98.2 F (36.8 C) 98.2 F (36.8 C)  TempSrc: Oral  Axillary Axillary  SpO2: 100% 100% 95% 98%  Weight:      Height:        CBC:  Recent Labs  Lab 04/13/19 0437 04/14/19 0703  WBC 6.5 6.8  HGB 12.5* 12.6*  HCT 37.5* 38.6*  MCV 93.3 94.8  PLT 220 742    Basic Metabolic Panel:  Recent Labs  Lab 04/13/19 0437 04/14/19 0703  NA 140 142  K 3.9 4.0  CL 104 107  CO2 24 23  GLUCOSE 114* 101*  BUN 33* 21  CREATININE 1.17 0.96  CALCIUM 9.9 9.5   IMAGING past 24h No results found.    PHYSICAL EXAM     Temp:  [97.6 F (36.4 C)-98.2 F (36.8 C)] 98.2 F (36.8 C) (08/10 1218) Pulse Rate:  [86-100] 100 (08/10 1218) Resp:  [15-18] 18 (08/10 1218) BP: (121-158)/(72-117) 141/117 (08/10 1218) SpO2:  [95 %-100 %] 98 % (08/10 1218)  General - Well nourished, well developed, not in acute distress  Ophthalmologic - fundi not visualized due to noncooperation.  Cardiovascular - Regular rate and rhythm.  Mental Status -  Awake alert, orientated to self, but not orientated to age, time or place. Language exam showed nonfluent  speech with word finding difficulty and perseverations. Not able to form sentences. Able to repeat words  Cranial Nerves II - XII - II - left hemianopia. III, IV, VI - Extraocular movements intact, however, attending more on the right due to left hemianopia. V - Facial sensation intact  bilaterally. VII - Facial movement intact bilaterally. VIII - Hearing & vestibular intact bilaterally. X - Palate elevates symmetrically. XI - Chin turning & shoulder shrug intact bilaterally. XII - Tongue protrusion intact.  Motor Strength - [name] was normal in all extremities and pronator drift was absent.  Bulk was normal and fasciculations were absent.   Motor Tone - Muscle tone was assessed at the neck and appendages and was normal.  Reflexes - [name] were symmetrical in all extremities and he had no pathological reflexes.  Sensory - Light touch, temperature/pinprick were assessed and were symmetrical.    Coordination - slightly impaired on the left. Hemichorea like movement improved.   Gait and Station - not tested   ASSESSMENT/PLAN Mr. KAYCEON OKI is a 68 y.o. 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 Old R frontal infarct.    CTA head & neck - R P1 occlusion. Small BA w/ high grade atheromatous narrowing. L ICA siphon stenosis.  CT perfusion 19 cc penumbra  Cerebral angio TICI 2b revascularization occluded R PCOM and P PCA. Underlying large vessel atherosclerosis  MRI Rt PCA infarct    2D Echo EF 60 to 65%.   CT WO Contrast - 04/11/2019 - Evolving R PCA infarct. No   progression. No hemorrhage  Recommend 30-day OP monitoring to rule out A. fib   HIV neg  LDL 101   HgbA1c 9.8  P2Y12 90  SCDs for VTE prophylaxis  aspirin 81 mg daily and clopidogrel 75 mg daily prior to admission, put on aspirin 325 mg daily and clopidogrel 75 mg daily. However, developed itchiness, plavix was discontinued. Now on ASA 81 and Brilinta 90 bid. Continue on discharge.  Therapy recommendations:  SNF  Disposition:  Pending -  MCD pending    Hx stroke/TIA  11/2018 - presented for dizziness and unsteady, MRI showed right frontal and parietal small white matter infarcts. No LVO. Extensive SVD.  Put on DAPT.  Patient signed out [facility]  01/2016 - admitted for gait disability.  MRI negative for acute infarct.  Old infarcts B BG, thalami, L cerebellum.   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 However, pt developed vomiting episodes x 2 days. Improved after off abilify.  Hemichorea movement slowly improving  Cognitive decline  Disorientation, perseveration with words, not quite following commands  Less verbal   OT reported repetitive movement of left UE and "mental freezing", incoherent  CT head repeat stable  On seroquel QHs  Will do EEG to rule out seizure  Hypertension  Home meds:  None listed  Stable on the low side   Continue Lotensin to 5mg  daily . BP goal normotensive . monitor  UTI  U/A WBC > 50  IV Rocephin 8/9 x 3 doses  UCx w/ mult morphocytoes suggest recollection   Urine culture resent  Hyperlipidemia  Home meds:  No statin listed  LDL 101, goal < 70  AST and ALT normalized   Continue Lipitor 40  Continue statin on discharge  Diabetes type II, uncontrolled  Home meds:  glucophage 1000 bid  HgbA1c 9.8, goal < 7.0  Hyperglycemia much improved  On metformin 1000mg  bid from 7/11  CBGs  SSI  Close PCP follow-up  Dysphagia, resolved . Secondary to stroke . On D3 thin liquids . Cough with food lead to vomiting. CXR ok.  Other Stroke Risk Factors  Advanced age  Overweight, Body mass index is 29.3 kg/m., recommend weight loss, diet and exercise as appropriate   No driving due to hemianopia  Other Active Problems  Constipation, abd xray ok, put on senokot, prn bisacodyl and fleets  Hospital day # 35    [name], MD PhD Stroke Neurology 04/14/2019 5:43 PM   To contact Stroke Continuity provider, please refer to [email or web address] After hours, contact General Neurology

## 2019-04-15 ENCOUNTER — Inpatient Hospital Stay (HOSPITAL_COMMUNITY): Payer: Medicare Other

## 2019-04-15 LAB — GLUCOSE, CAPILLARY
Glucose-Capillary: 71 mg/dL (ref 70–99)
Glucose-Capillary: 76 mg/dL (ref 70–99)
Glucose-Capillary: 90 mg/dL (ref 70–99)
Glucose-Capillary: 108 mg/dL — ABNORMAL HIGH (ref 70–99)

## 2019-04-15 LAB — URINE CULTURE

## 2019-04-15 MED ORDER — DONEPEZIL HCL 5 MG PO TABS
5.0000 mg | ORAL_TABLET | Freq: Every day | ORAL | Status: DC
  Administered 2019-04-15 – 2019-05-12 (×28): 5 mg via ORAL
  Filled 2019-04-15 (×28): qty 1

## 2019-04-15 NOTE — Progress Notes (Signed)
STROKE TEAM PROGRESS NOTE   Interval history:  Pt RN and PT in room. Pt lying in bed, initially sleeping but easily arousable. Able to tell me his name and knows in [facility] hospital but not able to tell me name or time, perseveration on words. Hemichorea movement much improved.   Vitals:   04/15/19 0435 04/15/19 0503 04/15/19 0700 04/15/19 1100  BP: 135/87 (!) 139/98 140/81 137/85  Pulse: 97 75 (!) 56 85  Resp: 18 18 18 17   Temp: 98.5 F (36.9 C)  97.7 F (36.5 C) 98 F (36.7 C)  TempSrc: Oral  Axillary Axillary  SpO2: 100% 98% 100% 99%  Weight:      Height:        CBC:  Recent Labs  Lab 04/13/19 0437 04/14/19 0703  WBC 6.5 6.8  HGB 12.5* 12.6*  HCT 37.5* 38.6*  MCV 93.3 94.8  PLT 220 947    Basic Metabolic Panel:  Recent Labs  Lab 04/13/19 0437 04/14/19 0703  NA 140 142  K 3.9 4.0  CL 104 107  CO2 24 23  GLUCOSE 114* 101*  BUN 33* 21  CREATININE 1.17 0.96  CALCIUM 9.9 9.5   IMAGING past 24h No results found.    PHYSICAL EXAM     Temp:  [97.7 F (36.5 C)-98.6 F (37 C)] 98 F (36.7 C) (08/11 1100) Pulse Rate:  [56-103] 85 (08/11 1100) Resp:  [17-18] 17 (08/11 1100) BP: (112-151)/(80-98) 137/85 (08/11 1100) SpO2:  [93 %-100 %] 99 % (08/11 1100)  General - Well nourished, well developed, not in acute distress  Ophthalmologic - fundi not visualized due to noncooperation.  Cardiovascular - Regular rate and rhythm.  Mental Status -  Sleepy but arousable easiliy, orientated to self and place, but not orientated to age or time. Language exam not able to form sentences, repeating words with perseveration. Able to repeat words but not sentences  Cranial Nerves II - XII - II - left hemianopia. III, IV, VI - Extraocular movements intact V - Facial sensation intact bilaterally. VII - Facial movement intact bilaterally. VIII - Hearing & vestibular intact bilaterally. X - Palate elevates symmetrically. XI - Chin turning & shoulder shrug intact  bilaterally. XII - Tongue protrusion intact.  Motor Strength - [name] was normal in all extremities and pronator drift was absent.  Bulk was normal and fasciculations were absent.   Motor Tone - Muscle tone was assessed at the neck and appendages and was normal.  Reflexes - [name] were symmetrical in all extremities and he had no pathological reflexes.  Sensory - Light touch, temperature/pinprick were assessed and were symmetrical.    Coordination - slightly impaired on the left. Hemichorea like movement improved.   Gait and Station - not tested   ASSESSMENT/PLAN [name] is a 68 y.o. 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 Old R frontal infarct.    CTA head & neck - R P1 occlusion. Small BA w/ high grade atheromatous narrowing. L ICA siphon stenosis.  CT perfusion 19 cc penumbra  Cerebral angio TICI 2b revascularization occluded R PCOM and P PCA. Underlying large vessel atherosclerosis   MRI Rt PCA infarct    CT repeat - 04/11/2019 - Evolving R PCA infarct. No  progression. No hemorrhage  2D Echo EF 60 to 65%.   Recommend 30-day OP monitoring to rule out A. fib   HIV neg  LDL 101   HgbA1c 9.8  P2Y12 90  SCDs for VTE prophylaxis  aspirin 81 mg daily and clopidogrel 75 mg daily prior to admission, put on aspirin 325 mg daily and clopidogrel 75 mg daily. However, developed itchiness, plavix was discontinued. Now on ASA 81 and Brilinta 90 bid. Continue on discharge.  Therapy recommendations:  SNF  Disposition:  Pending -  MCD pending   Hx stroke/TIA  11/2018 - presented for dizziness and unsteady, MRI showed right frontal and parietal small white matter infarcts. No LVO. Extensive SVD.  Put on DAPT.  Patient signed out  [facility]  01/2016 - admitted for gait disability.  MRI negative for acute infarct.  Old infarcts B BG, thalami, L cerebellum.   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 However, pt developed vomiting episodes x 2 days. Improved after off abilify.  Hemichorea movement slowly improving  Cognitive decline  Disorientation, perseveration with words, not quite following commands  Less verbal   OT reported repetitive movement of left UE and "mental freezing", incoherent  CT head repeat stable  On seroquel QHs  EEG no seizure  Hypertension  Home meds:  None listed  Stable 130-150s  Lotensin 5mg  daily stopped 8/8  . BP goal normotensive . monitor  UTI  U/A WBC > 50  IV Rocephin 8/9 x 3 doses  UCx w/ mult morphocytoes suggest recollection   Urine culture resent, pending   Hyperlipidemia  Home meds:  No statin listed  LDL 101, goal < 70  AST and ALT normalized   Continue Lipitor 40  Continue statin on discharge  Diabetes type II, uncontrolled  Home meds:  glucophage 1000 bid  HgbA1c 9.8, goal < 7.0  Hyperglycemia much improved  On metformin 1000mg  bid from 7/11  CBGs  SSI  Close PCP follow-up  Dysphagia, resolved . Secondary to stroke . On D3 thin liquids . Cough with food lead to vomiting. CXR ok.  Other Stroke Risk Factors  Advanced age  Overweight, Body mass index is 29.3 kg/m., recommend weight loss, diet and exercise as appropriate   No driving due to hemianopia  Other Active Problems  Constipation, abd xray ok, put on senokot, prn bisacodyl and fleets  Hospital day # 36   Joe Hawking, MD PhD Stroke Neurology 04/15/2019 1:28 PM   To contact Stroke Continuity provider, please refer to [email or web address] After hours, contact General Neurology

## 2019-04-15 NOTE — Procedures (Addendum)
Patient Name: Joe Perry  MRN: [id number]  Epilepsy Attending: Lora Havens  Referring Physician/Provider: Dr [name] Date: 04/15/2019 Duration: 24.22 mins  Patient history: 68 year old male with seizure-like episodes.  EEG ordered to evaluate for seizures.  Level of alertness: Awake and drowsy  Technical aspects: This EEG study was done with scalp electrodes positioned according to the 10-20 International system of electrode placement. Electrical activity was acquired at a sampling rate of 500Hz  and reviewed with a high frequency filter of 70Hz  and a low frequency filter of 1Hz . EEG data were recorded continuously and digitally stored.   Description: The posterior dominant rhythm consists of 8-9 Hz activity of moderate voltage (25-35 uV) seen predominantly in posterior head regions, symmetric and reactive to eye opening and eye closing. Drowsiness was characterized by attenuation of the posterior background rhythm.  Continues 4 to 5 Hz theta slowing was seen in the right posterior temporal region.  Hyperventilation and photic stimulation were not performed.  IMPRESSION: This study is suggestive of cortical dysfunction in the right posterior temporal region. No seizures or epileptiform discharges were seen throughout the recording.  However, only wakefulness and drowsiness were recorded. If suspicion for interictal activity remains a concern, a prolonged study including sleep should be considered.   Hoorain Kozakiewicz Barbra Sarks

## 2019-04-15 NOTE — Progress Notes (Signed)
Physical Therapy Treatment Patient Details Name: Joe Perry MRN: [id number] DOB: 10/24/1950 Today's Date: 04/15/2019    History of Present Illness 67 yo male s/p  R PCOM and R PCA occlusion s/p emergent thrombectomy on 7/6. Pt admitteed with L sided weakness, R gaze perference, L homonymous hemianopsia. 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    PT Comments    Pt worked with PT - inconsistent with balance and following commands.  Wife present for therapy  Follow Up Recommendations  SNF;Supervision/Assistance - 24 hour     Equipment Recommendations  None recommended by PT    Recommendations for Other Services Rehab consult     Precautions / Restrictions Precautions Precautions: Fall Precaution Comments: L hemianopsia; L inattention; confused, poor attention, perseverative     Mobility  Bed Mobility Overal bed mobility: Needs Assistance Bed Mobility: Supine to Sit;Sit to Supine Rolling: Min assist   Supine to sit: Max assist     General bed mobility comments: pt did not initate movement - needed tactile cues  Transfers Overall transfer level: Needs assistance Equipment used: Rolling walker (2 wheeled)   Sit to Stand: Mod assist            Ambulation/Gait Ambulation/Gait assistance: Mod assist;+2 physical assistance Gait Distance (Feet): 50 Feet Assistive device: Rolling walker (2 wheeled) Gait Pattern/deviations: Staggering left;Staggering right     General Gait Details: pt needed significant stabilty through UEs - tried RW but this did not help him.  We had to hold pt and advance walker for pt.  pt also did some standing activities with RW - weight shifting side to side and working on improved posture - esp holding head up   Stairs             Wheelchair Mobility    Modified Rankin (Stroke Patients Only)       Balance               Standing balance comment: reliant on external support                             Cognition Arousal/Alertness: Awake/alert Behavior During Therapy: Flat affect;Impulsive;Restless Overall Cognitive Status: Impaired/Different from baseline                                 General Comments: rhythmic movement of left leg on arrival      Exercises General Exercises - Lower Extremity Long Arc Quad: AROM;Both;10 reps;Seated Hip Flexion/Marching: Both;10 reps;AROM;Seated Other Exercises Other Exercises: worked on pt sitting up right and raising up arm to give me five - getting abdominals activated into taller sitting in chair    General Comments General comments (skin integrity, edema, etc.): Pts wife present for therapy today. s howed her exercises that pt could do when sitting in the chair with her.  pt sat in chair with her present and making sure he stayed safely in chair.      Pertinent Vitals/Pain Pain Assessment: No/denies pain Pain Intervention(s): Monitored during session    Home Living                      Prior Function            PT Goals (current goals can now be found in the care plan section) Progress towards PT goals: Progressing toward goals  Frequency    Min 3X/week      PT Plan Current plan remains appropriate    Co-evaluation              AM-PAC PT "6 Clicks" Mobility   Outcome Measure  Help needed turning from your back to your side while in a flat bed without using bedrails?: A Little Help needed moving from lying on your back to sitting on the side of a flat bed without using bedrails?: A Little Help needed moving to and from a bed to a chair (including a wheelchair)?: A Lot Help needed standing up from a chair using your arms (e.g., wheelchair or bedside chair)?: A Lot Help needed to walk in hospital room?: A Lot Help needed climbing 3-5 steps with a railing? : Total 6 Click Score: 13    End of Session Equipment Utilized During Treatment: Gait belt Activity Tolerance: Patient tolerated  treatment well Patient left: in chair;with family/visitor present;with call bell/phone within reach;with chair alarm set Nurse Communication: Mobility status PT Visit Diagnosis: Muscle weakness (generalized) (M62.81);Hemiplegia and hemiparesis;Other abnormalities of gait and mobility (R26.89)     Time: 1610-1640 PT Time Calculation (min) (ACUTE ONLY): 30 min  Charges:  $Gait Training: 8-22 mins $Therapeutic Exercise: 8-22 mins                     04/15/2019   Rande Lawman, PT    Loyal Buba 04/15/2019, 5:07 PM

## 2019-04-15 NOTE — Progress Notes (Signed)
EEG complete - results pending 

## 2019-04-16 DIAGNOSIS — R4189 Other symptoms and signs involving cognitive functions and awareness: Secondary | ICD-10-CM

## 2019-04-16 LAB — BASIC METABOLIC PANEL
Anion gap: 10 (ref 5–15)
BUN: 10 mg/dL (ref 8–23)
CO2: 21 mmol/L — ABNORMAL LOW (ref 22–32)
Calcium: 9 mg/dL (ref 8.9–10.3)
Chloride: 108 mmol/L (ref 98–111)
Creatinine, Ser: 0.79 mg/dL (ref 0.61–1.24)
GFR calc Af Amer: >60 mL/min (ref >60)
GFR calc non Af Amer: >60 mL/min (ref >60)
Glucose, Bld: 96 mg/dL (ref 70–99)
Potassium: 3.5 mmol/L (ref 3.5–5.1)
Sodium: 139 mmol/L (ref 135–145)

## 2019-04-16 LAB — CBC
HCT: 35.7 % — ABNORMAL LOW (ref 39.0–52.0)
Hemoglobin: 11.8 g/dL — ABNORMAL LOW (ref 13.0–17.0)
MCH: 30.6 pg (ref 26.0–34.0)
MCHC: 33.1 g/dL (ref 30.0–36.0)
MCV: 92.7 fL (ref 80.0–100.0)
Platelets: 228 10*3/uL (ref 150–400)
RBC: 3.85 MIL/uL — ABNORMAL LOW (ref 4.22–5.81)
RDW: 12.5 % (ref 11.5–15.5)
WBC: 4.5 10*3/uL (ref 4.0–10.5)
nRBC: 0 % (ref 0.0–0.2)

## 2019-04-16 LAB — GLUCOSE, CAPILLARY
Glucose-Capillary: 88 mg/dL (ref 70–99)
Glucose-Capillary: 108 mg/dL — ABNORMAL HIGH (ref 70–99)
Glucose-Capillary: 116 mg/dL — ABNORMAL HIGH (ref 70–99)
Glucose-Capillary: 82 mg/dL (ref 70–99)

## 2019-04-16 NOTE — Progress Notes (Addendum)
STROKE TEAM PROGRESS NOTE   Interval history:  Pt sitting in bed, awake alert, more interactive today, still has left hemichorea, but now arm more than leg. Put on aricept last night.    Vitals:   04/15/19 2324 04/16/19 0318 04/16/19 0739 04/16/19 1122  BP: (!) 136/91 122/81 129/85 (!) 152/100  Pulse: 82 71 83 91  Resp: 18 17 20 20   Temp: 99.1 F (37.3 C) 98.1 F (36.7 C) (!) 97.5 F (36.4 C) 98.1 F (36.7 C)  TempSrc: Oral  Oral Oral  SpO2: 100%  99% 99%  Weight:      Height:        CBC:  Recent Labs  Lab 04/14/19 0703 04/16/19 0549  WBC 6.8 4.5  HGB 12.6* 11.8*  HCT 38.6* 35.7*  MCV 94.8 92.7  PLT 233 701    Basic Metabolic Panel:  Recent Labs  Lab 04/14/19 0703 04/16/19 0549  NA 142 139  K 4.0 3.5  CL 107 108  CO2 23 21*  GLUCOSE 101* 96  BUN 21 10  CREATININE 0.96 0.79  CALCIUM 9.5 9.0   IMAGING past 24h No results found.    PHYSICAL EXAM     Temp:  [97.5 F (36.4 C)-99.2 F (37.3 C)] 98.1 F (36.7 C) (08/12 1122) Pulse Rate:  [71-91] 91 (08/12 1122) Resp:  [17-20] 20 (08/12 1122) BP: (122-152)/(80-100) 152/100 (08/12 1122) SpO2:  [99 %-100 %] 99 % (08/12 1122)  General - Well nourished, well developed, not in acute distress  Ophthalmologic - fundi not visualized due to noncooperation.  Cardiovascular - Regular rate and rhythm.  Mental Status -  Sleepy but arousable easiliy, orientated to self and place, but not orientated to age or time. Language exam not able to form full sentences, repeating words with perseveration. Able to repeat words but not sentences  Cranial Nerves II - XII - II - left hemianopia. III, IV, VI - Extraocular movements intact V - Facial sensation intact bilaterally. VII - Facial movement intact bilaterally. VIII - Hearing & vestibular intact bilaterally. X - Palate elevates symmetrically. XI - Chin turning & shoulder shrug intact bilaterally. XII - Tongue protrusion intact.  Motor Strength - The  patient's strength was normal in all extremities and pronator drift was absent.  Bulk was normal and fasciculations were absent.   Motor Tone - Muscle tone was assessed at the neck and appendages and was normal.  Reflexes - [name] were symmetrical in all extremities and he had no pathological reflexes.  Sensory - Light touch, temperature/pinprick were assessed and were symmetrical.    Coordination - Left hemichorea movement improved, but left arm > leg.   Gait and Station - not tested   ASSESSMENT/PLAN Mr. TIRAS BIANCHINI is a 68 y.o. 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 Old R frontal infarct.    CTA head & neck - R P1 occlusion. Small BA w/ high grade atheromatous narrowing. L ICA siphon stenosis.  CT perfusion 19 cc penumbra  Cerebral angio TICI 2b revascularization occluded R PCOM and P PCA. Underlying large vessel atherosclerosis   MRI Rt PCA infarct    CT repeat - 04/11/2019 - Evolving R PCA infarct. No progression. No hemorrhage  2D Echo EF 60 to 65%.  Recommend 30-day OP monitoring to rule out A. fib   HIV neg  LDL 101   HgbA1c 9.8  P2Y12 90  SCDs for VTE prophylaxis  aspirin 81 mg daily and clopidogrel 75 mg daily prior to admission, put on aspirin 325 mg daily and clopidogrel 75 mg daily. However, developed itchiness, plavix was discontinued. Now on ASA 81 and Brilinta 90 bid. Continue on discharge.  Therapy recommendations:  SNF  Disposition:  Pending -  MCD pending   Hx stroke/TIA  11/2018 - presented for dizziness and unsteady, MRI showed right frontal and parietal small white matter infarcts. No LVO. Extensive SVD.  Put on DAPT.  Patient signed out [facility]  01/2016 - admitted for gait disability.  MRI negative for acute infarct.  Old  infarcts B BG, thalami, L cerebellum.   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 However, pt developed vomiting episodes x 2 days. Improved after off abilify.  Hemichorea movement slowly improving  Cognitive decline  Disorientation, perseveration with words, not quite following commands  Less verbal   OT reported repetitive movement of left UE and "mental freezing", incoherent  CT head repeat stable  On seroquel QHs  EEG no seizure  Put on aricept 5mg  daily   Hypertension  Home meds:  None listed  Stable 130-150s  Lotensin 5mg  daily stopped 8/8  . BP goal normotensive  UTI  U/A WBC > 50  IV Rocephin 8/9 x 3 doses - completed  UCx w/ mult morphocytoes suggest recollection x 2  Hyperlipidemia  Home meds:  No statin listed  LDL 101, goal < 70  AST and ALT normalized   Continue Lipitor 40  Continue statin on discharge  Diabetes type II, uncontrolled  Home meds:  glucophage 1000 bid  HgbA1c 9.8, goal < 7.0  Hyperglycemia much improved  On metformin 1000mg  bid from 7/11  CBGs  SSI  Close PCP follow-up  Dysphagia, resolved . Secondary to stroke . On D3 thin liquids  Other Stroke Risk Factors  Advanced age  Overweight, Body mass index is 29.3 kg/m., recommend weight loss, diet and exercise as appropriate   No driving due to hemianopia  Other Active Problems  Constipation, abd xray ok, put on senokot, prn bisacodyl and fleets  N/V - resolved   Hospital day # 37   [name], MD PhD Stroke Neurology 04/16/2019 2:55 PM   To contact Stroke Continuity provider, please refer to [email or web address] After hours, contact General Neurology

## 2019-04-17 LAB — GLUCOSE, CAPILLARY
Glucose-Capillary: 94 mg/dL (ref 70–99)
Glucose-Capillary: 85 mg/dL (ref 70–99)
Glucose-Capillary: 123 mg/dL — ABNORMAL HIGH (ref 70–99)
Glucose-Capillary: 122 mg/dL — ABNORMAL HIGH (ref 70–99)

## 2019-04-17 NOTE — Progress Notes (Signed)
  Speech Language Pathology Treatment: Cognitive-Linquistic  Patient Details Name: Joe Perry MRN: [id number] DOB: 1950-10-05 Today's Date: 04/17/2019 Time: 4098-1191 SLP Time Calculation (min) (ACUTE ONLY): 25 min  Assessment / Plan / Recommendation Clinical Impression  Pt was seen for language treatment with his wife present. Pt's wife was educated regarding the pt's deficits, and his performance during sessions. She indicated that the pt has always been a "soft" speaker, does not talk much, and typically has a reduced vocal intensity. 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 She stated that his current vocal intensity is at baseline but his verbal output is reduced. Pt's wife verbalized understanding regarding all areas of education and all her questions were answered to her satisfaction but she still stated that she was "confused about everything that's going". Pt answered simple yes/no questions with 100% accuracy and followed 1-step commands with 100% accuracy. He required models and tactile cues to follow 2-step commands. He was able to produce automatic sequences with min cues for initiation. He demonstrated 40% accuracy with confrontational naming increasing to 80% with mod-max cues. SLP will continue to follow pt.    HPI HPI: 68 year old male admitted 03/10/2019 with left weakness, unsteady gait, right gaze, left hemianopsia. PMH: CVA, HTN, DM. CT negative. MRI Ischemic infarct of the right posterior cerebral artery, territory, roughly corresponding to the ischemic volume.      SLP Plan  Continue with current plan of care       Recommendations                   Oral Care Recommendations: Oral care BID Follow up Recommendations: Skilled Nursing facility SLP Visit Diagnosis: Cognitive communication deficit (R41.841);Aphasia (R47.01);Dysphagia, unspecified (R13.10) Plan: Continue with current plan of care        Revanth Neidig I. Hardin Negus, [place], [place] Office number [phone] Pager [place] 04/17/2019, 2:52 PM

## 2019-04-17 NOTE — Progress Notes (Signed)
STROKE TEAM PROGRESS NOTE   Interval history:  Wife at bedside. Pt lying in bed, neuro stable. No acute change. 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 Not sure if on any medication before.      Vitals:   04/17/19 0340 04/17/19 0855 04/17/19 1246 04/17/19 1249  BP: (!) 155/95 109/71 (!) 135/100 (!) 155/76  Pulse: 89 64 98 89  Resp: 19 18 18    Temp: 98.6 F (37 C) 97.8 F (36.6 C) 98.1 F (36.7 C)   TempSrc:  Oral Oral   SpO2:  100% 98% 98%  Weight:      Height:        CBC:  Recent Labs  Lab 04/14/19 0703 04/16/19 0549  WBC 6.8 4.5  HGB 12.6* 11.8*  HCT 38.6* 35.7*  MCV 94.8 92.7  PLT 233 381    Basic Metabolic Panel:  Recent Labs  Lab 04/14/19 0703 04/16/19 0549  NA 142 139  K 4.0 3.5  CL 107 108  CO2 23 21*  GLUCOSE 101* 96  BUN 21 10  CREATININE 0.96 0.79  CALCIUM 9.5 9.0   IMAGING past 24h No results found.    PHYSICAL EXAM      Temp:  [97.8 F (36.6 C)-99 F (37.2 C)] 98.1 F (36.7 C) (08/13 1246) Pulse Rate:  [64-98] 89 (08/13 1249) Resp:  [18-20] 18 (08/13 1246) BP: (109-163)/(71-100) 155/76 (08/13 1249) SpO2:  [98 %-100 %] 98 % (08/13 1249)  General - Well nourished, well developed, not in acute distress  Ophthalmologic - fundi not visualized due to noncooperation.  Cardiovascular - Regular rate and rhythm.  Mental Status -  Sleepy but arousable easiliy, orientated to self and place, but not orientated to age or time. Language exam not able to form full sentences, repeating words with perseveration. Able to repeat words but not sentences  Cranial Nerves II - XII - II - left hemianopia. III, IV, VI - Extraocular movements intact V - Facial sensation intact bilaterally. VII - Facial movement intact bilaterally. VIII - Hearing & vestibular intact bilaterally. X - Palate elevates symmetrically. XI - Chin turning & shoulder shrug intact bilaterally. XII - Tongue protrusion intact.  Motor Strength -  [name] was normal in all extremities and pronator drift was absent.  Bulk was normal and fasciculations were absent.   Motor Tone - Muscle tone was assessed at the neck and appendages and was normal.  Reflexes - [name] were symmetrical in all extremities and he had no pathological reflexes.  Sensory - Light touch, temperature/pinprick were assessed and were symmetrical.    Coordination - Left hemichorea movement improved, but left arm > leg.   Gait and Station - not tested   ASSESSMENT/PLAN Mr. STEFON RAMTHUN is a 68 y.o. 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 Old R frontal infarct.    CTA head & neck - R P1 occlusion. Small BA w/ high grade atheromatous narrowing. L ICA siphon stenosis.  CT perfusion 19 cc penumbra  Cerebral angio TICI 2b revascularization occluded R PCOM and P PCA. Underlying large vessel atherosclerosis   MRI Rt PCA infarct    CT repeat - 04/11/2019 - Evolving R PCA infarct. No progression. No hemorrhage  2D Echo EF 60 to 65%.   Recommend 30-day OP monitoring to rule out A. fib   HIV neg  LDL 101   HgbA1c 9.8  P2Y12 90  Lovenox 40 mg sq daily  for VTE prophylaxis  aspirin 81 mg daily and clopidogrel 75 mg daily prior to admission, put on aspirin 325 mg daily and clopidogrel 75 mg daily. However, developed itchiness, plavix was discontinued. Now on ASA 81 and Brilinta 90 bid. Continue on discharge.  Therapy recommendations:  SNF  Disposition:  Pending -  MCD pending   Hx stroke/TIA  11/2018 - presented for dizziness and unsteady, MRI showed right frontal and parietal small white matter infarcts. No LVO. Extensive SVD.  Put on DAPT.  Patient signed out [facility]  01/2016 - admitted for gait disability.  MRI negative for  acute infarct.  Old infarcts B BG, thalami, L cerebellum.   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 However, pt developed vomiting episodes x 2 days. Improved after off abilify.  Hemichorea movement slowly improving  ?? PD with left hand tremor diagnosed in [place] - wife not sure if he was on any medication before   Cognitive decline  Disorientation, perseveration with words, not quite following commands  Less verbal   OT reported repetitive movement of left UE and "mental freezing", incoherent  CT head repeat stable  On seroquel QHs  EEG no seizure  Continue aricept 5mg  daily - increase to 10mg  in one month if tolerated  Hypertension  Home meds:  None listed  Stable 130-150s  Lotensin 5mg  daily stopped 8/8  . BP goal normotensive  UTI  U/A WBC > 50  IV Rocephin 8/9 x 3 doses - completed  UCx w/ mult morphocytoes, recollection x 2 w/ multiple species  Hyperlipidemia  Home meds:  No statin listed  LDL 101, goal < 70  AST and ALT normalized   Continue Lipitor 40  Continue statin on discharge  Diabetes type II, uncontrolled  Home meds:  glucophage 1000 bid  HgbA1c 9.8, goal < 7.0  Hyperglycemia much improved  On metformin 1000mg  bid from 7/11  CBGs  SSI  Close PCP follow-up  Dysphagia, resolved . Secondary to stroke . On D3 thin liquids  Other Stroke Risk Factors  Advanced age  Overweight, Body mass index is 29.3 kg/m., recommend weight loss, diet and exercise as appropriate   No driving due to hemianopia  Other Active Problems  Constipation, abd xray ok, put on senokot, prn bisacodyl and fleets  N/V - resolved   Hospital day # 47   I had long discussion with wife at bedside, updated pt current condition, treatment plan and potential prognosis. She expressed  understanding and appreciation.   [name], MD PhD Stroke Neurology 04/17/2019 1:50 PM   To contact Stroke Continuity provider, please refer to [email or web address] After hours, contact General Neurology

## 2019-04-17 NOTE — Progress Notes (Signed)
Physical Therapy Treatment Patient Details Name: Joe Perry MRN: [id number] DOB: 1951-05-28 Today's Date: 04/17/2019    History of Present Illness 68 yo male s/p  R PCOM and R PCA occlusion s/p emergent thrombectomy on 7/6. Pt admitteed with L sided weakness, R gaze perference, L homonymous hemianopsia. 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    PT Comments    Patient seen for mobility progression. Pt requires mod/max A +2 for OOB mobility this session. Current plan remains appropriate.    Follow Up Recommendations  SNF;Supervision/Assistance - 24 hour     Equipment Recommendations  None recommended by PT    Recommendations for Other Services       Precautions / Restrictions Precautions Precautions: Fall Precaution Comments: L hemianopsia; L inattention; confused, poor attention, perseverative  Restrictions Weight Bearing Restrictions: No    Mobility  Bed Mobility Overal bed mobility: Needs Assistance Bed Mobility: Supine to Sit     Supine to sit: Mod assist     General bed mobility comments: assist to bring bilat LE from EOB and to elevate trunk into sitting; step by step cues needed  Transfers Overall transfer level: Needs assistance Equipment used: 1 person hand held assist Transfers: Sit to/from Stand Sit to Stand: Mod assist;+2 safety/equipment         General transfer comment: assist to power up into standing and to gain balance upon standing  Ambulation/Gait Ambulation/Gait assistance: +2 physical assistance;Max assist;Mod assist Gait Distance (Feet): 50 Feet Assistive device: 2 person hand held assist(assist at trunk with gait belt) Gait Pattern/deviations: Staggering left;Staggering right;Ataxic;Trunk flexed;Step-through pattern Gait velocity: decreased   General Gait Details: assistance required for balance and assist to weight shift; pt with uncoordinated steps particularly with L LE and with LOB X 3; increased stability needed for turning     Stairs             Wheelchair Mobility    Modified Rankin (Stroke Patients Only) Modified Rankin (Stroke Patients Only) Pre-Morbid Rankin Score: No symptoms Modified Rankin: Moderately severe disability     Balance Overall balance assessment: Needs assistance Sitting-balance support: Feet supported;Single extremity supported Sitting balance-[name]: Fair     Standing balance support: During functional activity Standing balance-[name]: Zero Standing balance comment: reliant on external support                            Cognition Arousal/Alertness: Awake/alert Behavior During Therapy: Flat affect Overall Cognitive Status: Impaired/Different from baseline Area of Impairment: Attention;Following commands;Safety/judgement;Problem solving                 Orientation Level: Situation Current Attention Level: Focused;Sustained   Following Commands: Follows one step commands inconsistently;Follows one step commands with increased time Safety/Judgement: Decreased awareness of safety;Decreased awareness of deficits Awareness: Intellectual Problem Solving: Requires verbal cues;Requires tactile cues;Decreased initiation;Slow processing;Difficulty sequencing        Exercises      General Comments        Pertinent Vitals/Pain Pain Assessment: Faces Faces Pain Scale: No hurt    Home Living                      Prior Function            PT Goals (current goals can now be found in the care plan section) Acute Rehab PT Goals Patient Stated Goal: Pt unable to state  Progress towards PT goals: Progressing toward  goals    Frequency    Min 3X/week      PT Plan Current plan remains appropriate    Co-evaluation              AM-PAC PT "6 Clicks" Mobility   Outcome Measure  Help needed turning from your back to your side while in a flat bed without using bedrails?: A Little Help needed moving from lying on your  back to sitting on the side of a flat bed without using bedrails?: A Lot Help needed moving to and from a bed to a chair (including a wheelchair)?: A Lot Help needed standing up from a chair using your arms (e.g., wheelchair or bedside chair)?: A Lot Help needed to walk in hospital room?: A Lot Help needed climbing 3-5 steps with a railing? : Total 6 Click Score: 12    End of Session Equipment Utilized During Treatment: Gait belt Activity Tolerance: Patient tolerated treatment well Patient left: in chair;with call bell/phone within reach;with chair alarm set Nurse Communication: Mobility status PT Visit Diagnosis: Muscle weakness (generalized) (M62.81);Hemiplegia and hemiparesis;Other abnormalities of gait and mobility (R26.89) Hemiplegia - Right/Left: Left Hemiplegia - dominant/non-dominant: Dominant Hemiplegia - caused by: Cerebral infarction     Time: 0017-4944 PT Time Calculation (min) (ACUTE ONLY): 20 min  Charges:  $Gait Training: 8-22 mins                     Earney Navy, PTA Acute Rehabilitation Services Pager: [phone] Office: [phone]     Darliss Cheney 04/17/2019, 4:52 PM

## 2019-04-18 LAB — BASIC METABOLIC PANEL
Anion gap: 10 (ref 5–15)
BUN: 6 mg/dL — ABNORMAL LOW (ref 8–23)
CO2: 24 mmol/L (ref 22–32)
Calcium: 9.3 mg/dL (ref 8.9–10.3)
Chloride: 107 mmol/L (ref 98–111)
Creatinine, Ser: 0.79 mg/dL (ref 0.61–1.24)
GFR calc Af Amer: >60 mL/min (ref >60)
GFR calc non Af Amer: >60 mL/min (ref >60)
Glucose, Bld: 95 mg/dL (ref 70–99)
Potassium: 3.7 mmol/L (ref 3.5–5.1)
Sodium: 141 mmol/L (ref 135–145)

## 2019-04-18 LAB — CBC
HCT: 35.6 % — ABNORMAL LOW (ref 39.0–52.0)
Hemoglobin: 11.9 g/dL — ABNORMAL LOW (ref 13.0–17.0)
MCH: 30.7 pg (ref 26.0–34.0)
MCHC: 33.4 g/dL (ref 30.0–36.0)
MCV: 91.8 fL (ref 80.0–100.0)
Platelets: 253 10*3/uL (ref 150–400)
RBC: 3.88 MIL/uL — ABNORMAL LOW (ref 4.22–5.81)
RDW: 12.6 % (ref 11.5–15.5)
WBC: 5.5 10*3/uL (ref 4.0–10.5)
nRBC: 0 % (ref 0.0–0.2)

## 2019-04-18 LAB — GLUCOSE, CAPILLARY
Glucose-Capillary: 78 mg/dL (ref 70–99)
Glucose-Capillary: 118 mg/dL — ABNORMAL HIGH (ref 70–99)
Glucose-Capillary: 97 mg/dL (ref 70–99)
Glucose-Capillary: 99 mg/dL (ref 70–99)

## 2019-04-18 MED ORDER — DIPHENHYDRAMINE HCL 50 MG/ML IJ SOLN
25.0000 mg | Freq: Once | INTRAMUSCULAR | Status: AC
  Administered 2019-04-18: 22:00:00 25 mg via INTRAVENOUS
  Filled 2019-04-18: qty 1

## 2019-04-18 NOTE — Progress Notes (Signed)
STROKE TEAM PROGRESS NOTE   Interval history:  No family at bedside.  Patient condition unchanged.  Labs within normal limits, DC IV fluid.  Pending SNF.  Vitals:   04/18/19 0019 04/18/19 0359 04/18/19 0808 04/18/19 1203  BP: 139/76 (!) 148/94 121/64 (!) 142/88  Pulse: (!) 55 78 (!) 55 86  Resp: 17 17 18 18   Temp: (!) 97.5 F (36.4 C) 97.7 F (36.5 C) 97.9 F (36.6 C) 98 F (36.7 C)  TempSrc: Oral Oral Axillary Oral  SpO2:   95% 99%  Weight:      Height:        CBC:  Recent Labs  Lab 04/16/19 0549 04/18/19 0519  WBC 4.5 5.5  HGB 11.8* 11.9*  HCT 35.7* 35.6*  MCV 92.7 91.8  PLT 228 786    Basic Metabolic Panel:  Recent Labs  Lab 04/16/19 0549 04/18/19 0519  NA 139 141  K 3.5 3.7  CL 108 107  CO2 21* 24  GLUCOSE 96 95  BUN 10 6*  CREATININE 0.79 0.79  CALCIUM 9.0 9.3   IMAGING past 24h No results found.    PHYSICAL EXAM       Temp:  [97.5 F (36.4 C)-98.5 F (36.9 C)] 98 F (36.7 C) (08/14 1203) Pulse Rate:  [55-102] 86 (08/14 1203) Resp:  [16-18] 18 (08/14 1203) BP: (114-170)/(64-97) 142/88 (08/14 1203) SpO2:  [95 %-99 %] 99 % (08/14 1203)  General - Well nourished, well developed, not in acute distress  Ophthalmologic - fundi not visualized due to noncooperation.  Cardiovascular - Regular rate and rhythm.  Mental Status -  Sleepy but arousable easiliy, orientated to self and place, but not orientated to age or time. Language exam not able to form full sentences, repeating words with perseveration and stuttering. Able to repeat words but not sentences  Cranial Nerves II - XII - II - left hemianopia. III, IV, VI - Extraocular movements intact V - Facial sensation intact bilaterally. VII - Facial movement intact bilaterally. VIII - Hearing & vestibular intact bilaterally. X - Palate elevates symmetrically. XI - Chin turning & shoulder shrug intact bilaterally. XII - Tongue protrusion intact.  Motor Strength - [name]  was normal in all extremities and pronator drift was absent.  Bulk was normal and fasciculations were absent.   Motor Tone - Muscle tone was assessed at the neck and appendages and was normal.  Reflexes - [name] were symmetrical in all extremities and he had no pathological reflexes.  Sensory - Light touch, temperature/pinprick were assessed and were symmetrical.    Coordination - Left hemichorea movement improved, but left arm > leg.   Gait and Station - not tested   ASSESSMENT/PLAN Joe Perry is a 68 y.o. 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 Old R frontal infarct.    CTA head & neck - R P1 occlusion. Small BA w/ high grade atheromatous narrowing. L ICA siphon stenosis.  CT perfusion 19 cc penumbra  Cerebral angio TICI 2b revascularization occluded R PCOM and P PCA. Underlying large vessel atherosclerosis   MRI Rt PCA infarct    CT repeat - 04/11/2019 - Evolving R PCA infarct. No progression. No hemorrhage  2D Echo EF 60 to 65%.   Recommend  30-day OP monitoring to rule out A. fib   HIV neg  LDL 101   HgbA1c 9.8  P2Y12 90  Lovenox 40 mg sq daily  for VTE prophylaxis  aspirin 81 mg daily and clopidogrel 75 mg daily prior to admission, put on aspirin 325 mg daily and clopidogrel 75 mg daily. However, developed itchiness, plavix was discontinued. Now on ASA 81 and Brilinta 90 bid. Continue on discharge.  Therapy recommendations:  SNF  Disposition:  Pending -  MCD pending   Hx stroke/TIA  11/2018 - presented for dizziness and unsteady, MRI showed right frontal and parietal small white matter infarcts. No LVO. Extensive SVD.  Put on DAPT.  Patient signed out [facility]  01/2016 - admitted for gait disability.  MRI negative for acute infarct.  Old  infarcts B BG, thalami, L cerebellum.   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 However, pt developed vomiting episodes x 2 days. Improved after off abilify.  Hemichorea movement slowly improving  ?? PD with left hand tremor diagnosed in [place] - wife not sure if he was on any medication before   Cognitive decline  Disorientation, perseveration with words, not quite following commands  Less verbal   OT reported repetitive movement of left UE and "mental freezing", incoherent  CT head repeat stable  On seroquel QHs  EEG no seizure  Continue aricept 5mg  daily - increase to 10mg  in one month if tolerated  Hypertension  Home meds:  None listed  Stable 130-150s  Lotensin 5mg  daily stopped 8/8  . BP goal normotensive  UTI  U/A WBC > 50  IV Rocephin 8/9 x 3 doses - completed  UCx w/ mult morphocytoes, recollection x 2 w/ multiple species  Hyperlipidemia  Home meds:  No statin listed  LDL 101, goal < 70  AST and ALT normalized   Continue Lipitor 40  Continue statin on discharge  Diabetes type II, uncontrolled  Home meds:  glucophage 1000 bid  HgbA1c 9.8, goal < 7.0  Hyperglycemia much improved  On metformin 1000mg  bid from 7/11  CBGs  SSI  Close PCP follow-up  Dysphagia, resolved . Secondary to stroke . 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 After hours, contact General Neurology

## 2019-04-18 NOTE — Progress Notes (Signed)
  Speech Language Pathology Treatment: Cognitive-Linquistic;Dysphagia  Patient Details Name: Joe Perry MRN: [id number] DOB: 01-17-1951 Today's Date: 04/18/2019 Time: 2549-8264 SLP Time Calculation (min) (ACUTE ONLY): 20 min  Assessment / Plan / Recommendation Clinical Impression  Pt was seen for aphasia treatment during dinner. Pt was fed by his wife and she was educated regarding the swallowing precautions to reduce aspiration. She verbalized understanding and all of her questions were answered. Pt tolerated mechanical soft solids and liquids via tsp when swallowing precautions were observed. However, he inconsistently exhibited coughing with large boluses and when rate of intake was increased. Mastication time was increased and mild-mod oral residue was noted. However, residue was reduced with a liquid wash. Pt's wife reported that the pt's mastication time is his baseline and that he has always been a slow eater. He was able to name various items during the meal with min-mod cues and produced some short phrases such as, "it is good" and "I like it" in response to the SLP's questions. Pt was able to follow 1-step commands with tactile cues. SLP will continue to follow pt.    HPI HPI: 68 year old male admitted 03/10/2019 with left weakness, unsteady gait, right gaze, left hemianopsia. PMH: CVA, HTN, DM. CT negative. MRI Ischemic infarct of the right posterior cerebral artery, territory, roughly corresponding to the ischemic volume.      SLP Plan  Continue with current plan of care       Recommendations                   Oral Care Recommendations: Oral care BID Follow up Recommendations: Skilled Nursing facility SLP Visit Diagnosis: Cognitive communication deficit (R41.841);Aphasia (R47.01);Dysphagia, unspecified (R13.10) Plan: Continue with current plan of care       Raymel Cull I. Hardin Negus, [place], [place] Office number [phone] Pager  [place] 04/18/2019, 5:35 PM

## 2019-04-19 LAB — GLUCOSE, CAPILLARY
Glucose-Capillary: 61 mg/dL — ABNORMAL LOW (ref 70–99)
Glucose-Capillary: 77 mg/dL (ref 70–99)
Glucose-Capillary: 95 mg/dL (ref 70–99)
Glucose-Capillary: 146 mg/dL — ABNORMAL HIGH (ref 70–99)
Glucose-Capillary: 91 mg/dL (ref 70–99)

## 2019-04-19 NOTE — Progress Notes (Signed)
STROKE TEAM PROGRESS NOTE   Interval history:  No family at bedside.  Patient condition unchanged.   Marland Kitchen  Pending SNF.  Vitals:   04/18/19 1653 04/18/19 2046 04/18/19 2359 04/19/19 0430  BP: (!) 147/96 (!) 157/93 (!) 145/87 (!) 148/98  Pulse: 98 93 (!) 59 78  Resp: 18 18 18 18   Temp:  98 F (36.7 C) 97.7 F (36.5 C) 98.5 F (36.9 C)  TempSrc: Oral Oral Oral Oral  SpO2:  100% 100% 100%  Weight:      Height:        CBC:  Recent Labs  Lab 04/16/19 0549 04/18/19 0519  WBC 4.5 5.5  HGB 11.8* 11.9*  HCT 35.7* 35.6*  MCV 92.7 91.8  PLT 228 267    Basic Metabolic Panel:  Recent Labs  Lab 04/16/19 0549 04/18/19 0519  NA 139 141  K 3.5 3.7  CL 108 107  CO2 21* 24  GLUCOSE 96 95  BUN 10 6*  CREATININE 0.79 0.79  CALCIUM 9.0 9.3   IMAGING past 24h No results found.    PHYSICAL EXAM       Temp:  [97.7 F (36.5 C)-98.5 F (36.9 C)] 98.5 F (36.9 C) (08/15 0430) Pulse Rate:  [55-98] 78 (08/15 0430) Resp:  [18] 18 (08/15 0430) BP: (121-157)/(64-98) 148/98 (08/15 0430) SpO2:  [95 %-100 %] 100 % (08/15 0430)  General - Well nourished, well developed, not in acute distress  Ophthalmologic - fundi not visualized due to noncooperation.  Cardiovascular - Regular rate and rhythm.  Mental Status -  Sleepy but arousable easiliy, orientated to self and place, but not orientated to age or time. Language exam not able to form full sentences, repeating words with perseveration and stuttering. Able to repeat words but not sentences  Cranial Nerves II - XII - II - left hemianopia. III, IV, VI - Extraocular movements intact V - Facial sensation intact bilaterally. VII - Facial movement intact bilaterally. VIII - Hearing & vestibular intact bilaterally. X - Palate elevates symmetrically. XI - Chin turning & shoulder shrug intact bilaterally. XII - Tongue protrusion intact.  Motor Strength - [name] was normal in all extremities and pronator drift was  absent.  Bulk was normal and fasciculations were absent.   Motor Tone - Muscle tone was assessed at the neck and appendages and was normal.  Reflexes - [name] were symmetrical in all extremities and he had no pathological reflexes.  Sensory - Light touch, temperature/pinprick were assessed and were symmetrical.       Gait and Station - not tested   ASSESSMENT/PLAN [name] is a 68 y.o. 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 Old R frontal infarct.    CTA head & neck - R P1 occlusion. Small BA w/ high grade atheromatous narrowing. L ICA siphon stenosis.  CT perfusion 19 cc penumbra  Cerebral angio TICI 2b revascularization occluded R PCOM and P PCA. Underlying large vessel atherosclerosis   MRI Rt PCA infarct    CT repeat - 04/11/2019 - Evolving R PCA infarct. No progression. No hemorrhage  2D Echo EF 60 to 65%.   Recommend 30-day OP monitoring to rule out A. fib   HIV neg  LDL 101   HgbA1c  9.8  P2Y12 90  Lovenox 40 mg sq daily  for VTE prophylaxis  aspirin 81 mg daily and clopidogrel 75 mg daily prior to admission, put on aspirin 325 mg daily and clopidogrel 75 mg daily. However, developed itchiness, plavix was discontinued. Now on ASA 81 and Brilinta 90 bid. Continue on discharge.  Therapy recommendations:  SNF  Disposition:  Pending -  MCD pending   Hx stroke/TIA  11/2018 - presented for dizziness and unsteady, MRI showed right frontal and parietal small white matter infarcts. No LVO. Extensive SVD.  Put on DAPT.  Patient signed out [facility]  01/2016 - admitted for gait disability.  MRI negative for acute infarct.  Old infarcts B BG, thalami, L cerebellum.   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 However, pt developed vomiting episodes x 2 days. Improved after off abilify.  Hemichorea movement slowly improving  ?? PD with left hand tremor diagnosed in [place] - wife not sure if he was on any medication before   Cognitive decline  Disorientation, perseveration with words, not quite following commands  Less verbal   OT reported repetitive movement of left UE and "mental freezing", incoherent  CT head repeat stable  On seroquel QHs  EEG no seizure  Continue aricept 5mg  daily - increase to 10mg  in one month if tolerated  Hypertension  Home meds:  None listed  Stable 130-150s  Lotensin 5mg  daily stopped 8/8  . BP goal normotensive  UTI  U/A WBC > 50  IV Rocephin 8/9 x 3 doses - completed  UCx w/ mult morphocytoes, recollection x 2 w/ multiple species  Hyperlipidemia  Home meds:  No statin listed  LDL 101, goal < 70  AST and ALT normalized   Continue Lipitor 40  Continue statin on discharge  Diabetes type II, uncontrolled  Home meds:  glucophage 1000 bid  HgbA1c 9.8, goal < 7.0  Hyperglycemia much improved  On metformin 1000mg  bid from 7/11  CBGs  SSI  Close PCP follow-up  Dysphagia, resolved . Secondary to stroke . 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  Yet awaiting nursing home bed availability. Antony Contras, MD    To contact Stroke Continuity provider, please refer to [email or web address] After hours, contact General Neurology

## 2019-04-20 LAB — GLUCOSE, CAPILLARY
Glucose-Capillary: 70 mg/dL (ref 70–99)
Glucose-Capillary: 101 mg/dL — ABNORMAL HIGH (ref 70–99)
Glucose-Capillary: 130 mg/dL — ABNORMAL HIGH (ref 70–99)
Glucose-Capillary: 137 mg/dL — ABNORMAL HIGH (ref 70–99)
Glucose-Capillary: 86 mg/dL (ref 70–99)

## 2019-04-20 MED ORDER — DIPHENHYDRAMINE HCL 25 MG PO CAPS
25.0000 mg | ORAL_CAPSULE | Freq: Four times a day (QID) | ORAL | Status: DC | PRN
  Administered 2019-04-20 – 2019-06-03 (×18): 25 mg via ORAL
  Filled 2019-04-20 (×18): qty 1

## 2019-04-20 NOTE — Progress Notes (Signed)
STROKE TEAM PROGRESS NOTE   Interval history:  No family at bedside.  Patient condition unchanged.   Marland Kitchen  Pending SNF.Nurse tech is feeding him  Vitals:   04/19/19 1805 04/19/19 2001 04/19/19 2358 04/20/19 0318  BP: 131/82 (!) 146/98 (!) 148/97 132/87  Pulse: 62 84 91 65  Resp:  18 17 19   Temp:  97.9 F (36.6 C) 98.9 F (37.2 C) 98 F (36.7 C)  TempSrc:  Oral Oral Oral  SpO2:   97%   Weight:      Height:        CBC:  Recent Labs  Lab 04/16/19 0549 04/18/19 0519  WBC 4.5 5.5  HGB 11.8* 11.9*  HCT 35.7* 35.6*  MCV 92.7 91.8  PLT 228 086    Basic Metabolic Panel:  Recent Labs  Lab 04/16/19 0549 04/18/19 0519  NA 139 141  K 3.5 3.7  CL 108 107  CO2 21* 24  GLUCOSE 96 95  BUN 10 6*  CREATININE 0.79 0.79  CALCIUM 9.0 9.3   IMAGING past 24h No results found.    PHYSICAL EXAM       Temp:  [97.8 F (36.6 C)-98.9 F (37.2 C)] 98 F (36.7 C) (08/16 0318) Pulse Rate:  [62-91] 65 (08/16 0318) Resp:  [15-19] 19 (08/16 0318) BP: (119-152)/(82-131) 132/87 (08/16 0318) SpO2:  [97 %-100 %] 97 % (08/15 2358)  General - Well nourished, well developed, not in acute distress  Ophthalmologic - fundi not visualized due to noncooperation.  Cardiovascular - Regular rate and rhythm.  Mental Status -  Sleepy but arousable easiliy, orientated to self and place, but not orientated to age or time. Language exam not able to form full sentences, repeating words with perseveration and stuttering. Able to repeat words but not sentences  Cranial Nerves II - XII - II - left hemianopia. III, IV, VI - Extraocular movements intact V - Facial sensation intact bilaterally. VII - Facial movement intact bilaterally. VIII - Hearing & vestibular intact bilaterally. X - Palate elevates symmetrically. XI - Chin turning & shoulder shrug intact bilaterally. XII - Tongue protrusion intact.  Motor Strength - [name] was normal in all extremities and pronator drift was  absent.  Bulk was normal and fasciculations were absent.   Motor Tone - Muscle tone was assessed at the neck and appendages and was normal.  Reflexes - [name] were symmetrical in all extremities and he had no pathological reflexes.  Sensory - Light touch, temperature/pinprick were assessed and were symmetrical.       Gait and Station - not tested   ASSESSMENT/PLAN Joe Perry is a 68 y.o. 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 Old R frontal infarct.    CTA head & neck - R P1 occlusion. Small BA w/ high grade atheromatous narrowing. L ICA siphon stenosis.  CT perfusion 19 cc penumbra  Cerebral angio TICI 2b revascularization occluded R PCOM and P PCA. Underlying large vessel atherosclerosis   MRI Rt PCA infarct    CT repeat - 04/11/2019 - Evolving R PCA infarct. No progression. No hemorrhage  2D Echo EF 60 to 65%.   Recommend 30-day OP monitoring to rule out A. fib   HIV neg  LDL 101  HgbA1c 9.8  P2Y12 90  Lovenox 40 mg sq daily  for VTE prophylaxis  aspirin 81 mg daily and clopidogrel 75 mg daily prior to admission, put on aspirin 325 mg daily and clopidogrel 75 mg daily. However, developed itchiness, plavix was discontinued. Now on ASA 81 and Brilinta 90 bid. Continue on discharge.  Therapy recommendations:  SNF  Disposition:  Pending -  MCD pending   Hx stroke/TIA  11/2018 - presented for dizziness and unsteady, MRI showed right frontal and parietal small white matter infarcts. No LVO. Extensive SVD.  Put on DAPT.  Patient signed out [facility]  01/2016 - admitted for gait disability.  MRI negative for acute infarct.  Old infarcts B BG, thalami, L cerebellum.   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 However, pt developed vomiting episodes x 2 days. Improved after off abilify.  Hemichorea movement slowly improving  ?? PD with left hand tremor diagnosed in [place] - wife not sure if he was on any medication before   Cognitive decline  Disorientation, perseveration with words, not quite following commands  Less verbal   OT reported repetitive movement of left UE and "mental freezing", incoherent  CT head repeat stable  On seroquel QHs  EEG no seizure  Continue aricept 5mg  daily - increase to 10mg  in one month if tolerated  Hypertension  Home meds:  None listed  Stable 130-150s  Lotensin 5mg  daily stopped 8/8  . BP goal normotensive  UTI  U/A WBC > 50  IV Rocephin 8/9 x 3 doses - completed  UCx w/ mult morphocytoes, recollection x 2 w/ multiple species  Hyperlipidemia  Home meds:  No statin listed  LDL 101, goal < 70  AST and ALT normalized   Continue Lipitor 40  Continue statin on discharge  Diabetes type II, uncontrolled  Home meds:  glucophage 1000 bid  HgbA1c 9.8, goal < 7.0  Hyperglycemia much improved  On metformin 1000mg  bid from 7/11  CBGs  SSI  Close PCP follow-up  Dysphagia, resolved . Secondary to stroke . 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  Yet awaiting nursing home bed availability.Check cbc/ bmp in am  Antony Contras, MD     To contact Stroke Continuity provider, please refer to [email or web address] After hours, contact General Neurology

## 2019-04-21 LAB — GLUCOSE, CAPILLARY
Glucose-Capillary: 62 mg/dL — ABNORMAL LOW (ref 70–99)
Glucose-Capillary: 87 mg/dL (ref 70–99)
Glucose-Capillary: 122 mg/dL — ABNORMAL HIGH (ref 70–99)
Glucose-Capillary: 73 mg/dL (ref 70–99)
Glucose-Capillary: 81 mg/dL (ref 70–99)

## 2019-04-21 LAB — BASIC METABOLIC PANEL
Anion gap: 10 (ref 5–15)
BUN: 8 mg/dL (ref 8–23)
CO2: 25 mmol/L (ref 22–32)
Calcium: 9.5 mg/dL (ref 8.9–10.3)
Chloride: 106 mmol/L (ref 98–111)
Creatinine, Ser: 0.8 mg/dL (ref 0.61–1.24)
GFR calc Af Amer: >60 mL/min (ref >60)
GFR calc non Af Amer: >60 mL/min (ref >60)
Glucose, Bld: 109 mg/dL — ABNORMAL HIGH (ref 70–99)
Potassium: 3.6 mmol/L (ref 3.5–5.1)
Sodium: 141 mmol/L (ref 135–145)

## 2019-04-21 LAB — CBC
HCT: 39 % (ref 39.0–52.0)
Hemoglobin: 13 g/dL (ref 13.0–17.0)
MCH: 31 pg (ref 26.0–34.0)
MCHC: 33.3 g/dL (ref 30.0–36.0)
MCV: 92.9 fL (ref 80.0–100.0)
Platelets: 292 10*3/uL (ref 150–400)
RBC: 4.2 MIL/uL — ABNORMAL LOW (ref 4.22–5.81)
RDW: 13 % (ref 11.5–15.5)
WBC: 4.4 10*3/uL (ref 4.0–10.5)
nRBC: 0 % (ref 0.0–0.2)

## 2019-04-21 NOTE — Progress Notes (Signed)
Physical Therapy Treatment Patient Details Name: Joe Perry MRN: [id number] DOB: November 23, 1950 Today's Date: 04/21/2019    History of Present Illness 68 yo male s/p  R PCOM and R PCA occlusion s/p emergent thrombectomy on 7/6. Pt admitteed with L sided weakness, R gaze perference, L homonymous hemianopsia. 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    PT Comments    Goals updated.  Pt was able to preform ADL task EOB and at sink as well as ambulate into the hallway with up to two person mod assist.  He continues to perseverate at times, but can be redirected and has sustained attention with structure.  He responds well to multimodal cues and extra processing time.  He remains appropriate for post acute rehab at discharge.  PT will continue to follow acutely for safe mobility progression   Follow Up Recommendations  SNF;Supervision/Assistance - 24 hour     Equipment Recommendations  Wheelchair (measurements PT);Wheelchair cushion (measurements PT);3in1 (PT)    Recommendations for Other Services   NA     Precautions / Restrictions Precautions Precautions: Fall Precaution Comments: L hemianopsia; L inattention; confused, poor attention, perseverative  Restrictions Weight Bearing Restrictions: No    Mobility  Bed Mobility Overal bed mobility: Needs Assistance Bed Mobility: Supine to Sit;Sit to Supine     Supine to sit: Min assist;+2 for safety/equipment Sit to supine: Supervision   General bed mobility comments: Pt was able to come to EOB with min hand held assist and min assist to sequence legs out of bed first then trunk.  Pt given extra time to attempt on his own, but seemed stuck due to motor planning issues. He was able to reposition back in bed with supervision.    Transfers Overall transfer level: Needs assistance Equipment used: 2 person hand held assist Transfers: Sit to/from Stand Sit to Stand: Min assist;Mod assist         General transfer comment: Min to mod  assist to support trunk mostly for balance and to power up to come to standing.  Pt tends to pitch trunk forward of feet in standing and with fatigue, leans left.   Ambulation/Gait Ambulation/Gait assistance: Mod assist;Min assist;+2 physical assistance Gait Distance (Feet): 130 Feet Assistive device: 2 person hand held assist Gait Pattern/deviations: Step-through pattern;Staggering left;Trunk flexed;Narrow base of support;Scissoring     General Gait Details: Near scissoring gait pattern, especially with turns, leans left and anteriorly pitches with cues for upright posture.  Pt did "hit his stride" requiring less assist the further we went as the reciprocal gait pattern kicked in automatically with increased stride length as we went.         Modified Rankin (Stroke Patients Only) Modified Rankin (Stroke Patients Only) Pre-Morbid Rankin Score: No symptoms Modified Rankin: Moderately severe disability     Balance Overall balance assessment: Needs assistance Sitting-balance support: Feet supported;Bilateral upper extremity supported;No upper extremity supported;Single extremity supported Sitting balance-[name]: Poor Sitting balance - Comments: at most min assist EOB as pt fatigues he leans more to the left, he also tends to engage his right hand on the rail and push to the left.   Postural control: Left lateral lean Standing balance support: Bilateral upper extremity supported;Single extremity supported Standing balance-[name]: Poor Standing balance comment: min to mod assist of two people standing at the sink preforming ALD task.  Pt leaning on his left arm , however, when he had to engage both hands, his balance improved.  Cognition Arousal/Alertness: Awake/alert Behavior During Therapy: Restless;Flat affect;Impulsive Overall Cognitive Status: Impaired/Different from baseline Area of Impairment: Attention;Memory;Following  commands;Safety/judgement;Awareness;Problem solving;JFK Recovery Scale                   Current Attention Level: Sustained Memory: Decreased recall of precautions;Decreased short-term memory Following Commands: Follows one step commands inconsistently;Follows one step commands with increased time Safety/Judgement: Decreased awareness of safety;Decreased awareness of deficits Awareness: Intellectual Problem Solving: Slow processing;Decreased initiation;Difficulty sequencing;Requires verbal cues;Requires tactile cues General Comments: Pt, when given multiple options can accurately report his birthday.  He does take extra time to process and responds well to multimodal cues.  He has difficulty sequencing and motor planning and is unaware of his balance deficits (forward, left leaning posture) and needs multimodal cues to correct.  Per OT he is more responsive and doing more cognitively compared to when she last saw him.              Pertinent Vitals/Pain Pain Assessment: Faces Faces Pain Scale: No hurt           PT Goals (current goals can now be found in the care plan section) Acute Rehab PT Goals Patient Stated Goal: unable to state, but agreeable to getting up and moving. PT Goal Formulation: Patient unable to participate in goal setting Time For Goal Achievement: 05/05/19 Potential to Achieve Goals: Fair Progress towards PT goals: Progressing toward goals    Frequency    Min 3X/week      PT Plan Current plan remains appropriate    Co-evaluation PT/OT/SLP Co-Evaluation/Treatment: Yes Reason for Co-Treatment: Complexity of the patient's impairments (multi-system involvement);Necessary to address cognition/behavior during functional activity;For patient/therapist safety;To address functional/ADL transfers PT goals addressed during session: Balance;Mobility/safety with mobility;Strengthening/ROM        AM-PAC PT "6 Clicks" Mobility   Outcome Measure  Help  needed turning from your back to your side while in a flat bed without using bedrails?: A Little Help needed moving from lying on your back to sitting on the side of a flat bed without using bedrails?: A Little Help needed moving to and from a bed to a chair (including a wheelchair)?: A Lot Help needed standing up from a chair using your arms (e.g., wheelchair or bedside chair)?: A Lot Help needed to walk in hospital room?: A Lot Help needed climbing 3-5 steps with a railing? : Total 6 Click Score: 13    End of Session Equipment Utilized During Treatment: Gait belt Activity Tolerance: Patient tolerated treatment well Patient left: in bed;with call bell/phone within reach;with bed alarm set;with nursing/sitter in room(RN tech in room) Nurse Communication: Mobility status PT Visit Diagnosis: Muscle weakness (generalized) (M62.81);Hemiplegia and hemiparesis;Other abnormalities of gait and mobility (R26.89) Hemiplegia - Right/Left: Left Hemiplegia - dominant/non-dominant: Dominant Hemiplegia - caused by: Cerebral infarction     Time: 1011-1100 PT Time Calculation (min) (ACUTE ONLY): 49 min  Charges:  $Gait Training: 8-22 mins                    Deshawn Witty B. Ludivina Guymon, PT, DPT  Acute Rehabilitation [phone] pager [phone] office  @ [facility]: [phone]   04/21/2019, 11:12 AM

## 2019-04-21 NOTE — Progress Notes (Signed)
STROKE TEAM PROGRESS NOTE   Interval history:  Patient is standing at his room sink holding onto 2 therapists were working with him.  Patient condition unchanged.   Marland Kitchen  Pending SNF.  BMP and CBC are normal  Vitals:   04/20/19 2337 04/21/19 0259 04/21/19 0736 04/21/19 1112  BP: (!) 153/97 (!) 157/93 119/73 118/79  Pulse: 86 81 76 78  Resp: 19 17 20 16   Temp: 98.6 F (37 C) (!) 97.4 F (36.3 C) 97.9 F (36.6 C) 98.9 F (37.2 C)  TempSrc: Oral Oral Oral Oral  SpO2: 98%  100% 100%  Weight:      Height:        CBC:  Recent Labs  Lab 04/18/19 0519 04/21/19 0957  WBC 5.5 4.4  HGB 11.9* 13.0  HCT 35.6* 39.0  MCV 91.8 92.9  PLT 253 161    Basic Metabolic Panel:  Recent Labs  Lab 04/18/19 0519 04/21/19 0957  NA 141 141  K 3.7 3.6  CL 107 106  CO2 24 25  GLUCOSE 95 109*  BUN 6* 8  CREATININE 0.79 0.80  CALCIUM 9.3 9.5   IMAGING past 24h No results found.    PHYSICAL EXAM       Temp:  [97.4 F (36.3 C)-98.9 F (37.2 C)] 98.9 F (37.2 C) (08/17 1112) Pulse Rate:  [76-101] 78 (08/17 1112) Resp:  [16-20] 16 (08/17 1112) BP: (118-157)/(73-97) 118/79 (08/17 1112) SpO2:  [97 %-100 %] 100 % (08/17 1112)  General - Well nourished, well developed, not in acute distress  Ophthalmologic - fundi not visualized due to noncooperation.  Cardiovascular - Regular rate and rhythm.  Mental Status -  Sleepy but arousable easiliy, orientated to self and place, but not orientated to age or time. Language exam not able to form full sentences, repeating words with perseveration and stuttering. Able to repeat words but not sentences  Cranial Nerves II - XII - II - left hemianopia. III, IV, VI - Extraocular movements intact V - Facial sensation intact bilaterally. VII - Facial movement intact bilaterally. VIII - Hearing & vestibular intact bilaterally. X - Palate elevates symmetrically. XI - Chin turning & shoulder shrug intact bilaterally. XII - Tongue protrusion  intact.  Motor Strength - [name] was normal in all extremities and pronator drift was absent.  Bulk was normal and fasciculations were absent.   Motor Tone - Muscle tone was assessed at the neck and appendages and was normal.  Reflexes - [name] were symmetrical in all extremities and he had no pathological reflexes.  Sensory - Light touch, temperature/pinprick were assessed and were symmetrical.       Gait and Station - not tested   ASSESSMENT/PLAN [name] is a 68 y.o. 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 Old R frontal infarct.    CTA head & neck - R P1 occlusion. Small BA w/ high grade atheromatous narrowing. L ICA siphon stenosis.  CT perfusion 19 cc penumbra  Cerebral angio TICI 2b revascularization occluded R PCOM and P PCA. Underlying large vessel atherosclerosis   MRI Rt PCA infarct    CT repeat - 04/11/2019 - Evolving R PCA infarct. No progression. No hemorrhage  2D Echo EF 60 to 65%.   Recommend  30-day OP monitoring to rule out A. fib   HIV neg  LDL 101   HgbA1c 9.8  P2Y12 90  Lovenox 40 mg sq daily  for VTE prophylaxis  aspirin 81 mg daily and clopidogrel 75 mg daily prior to admission, put on aspirin 325 mg daily and clopidogrel 75 mg daily. However, developed itchiness, plavix was discontinued. Now on ASA 81 and Brilinta 90 bid. Continue on discharge.  Therapy recommendations:  SNF Disposition:  Pending -   Hx stroke/TIA  11/2018 - presented for dizziness and unsteady, MRI showed right frontal and parietal small white matter infarcts. No LVO. Extensive SVD.  Put on DAPT.  Patient signed out [facility]  01/2016 - admitted for gait disability.  MRI negative for acute infarct.  Old infarcts B BG, thalami, L cerebellum.    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 However, pt developed vomiting episodes x 2 days. Improved after off abilify.  Hemichorea movement slowly improving  ?? PD with left hand tremor diagnosed in [place] - wife not sure if he was on any medication before   Cognitive decline  Disorientation, perseveration with words, not quite following commands  Less verbal   OT reported repetitive movement of left UE and "mental freezing", incoherent  CT head repeat stable  On seroquel QHs  EEG no seizure  Continue aricept 5mg  daily - increase to 10mg  in one month if tolerated  Hypertension  Home meds:  None listed  Stable 130-150s  Lotensin 5mg  daily stopped 8/8  . BP goal normotensive  UTI  U/A WBC > 50  IV Rocephin 8/9 x 3 doses - completed  UCx w/ mult morphocytoes, recollection x 2 w/ multiple species  Hyperlipidemia  Home meds:  No statin listed  LDL 101, goal < 70  AST and ALT normalized   Continue Lipitor 40  Continue statin on discharge  Diabetes type II, uncontrolled  Home meds:  glucophage 1000 bid  HgbA1c 9.8, goal < 7.0  Hyperglycemia much improved  On metformin 1000mg  bid from 7/11  CBGs  SSI  Close PCP follow-up  Dysphagia, resolved . Secondary to stroke . 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  Yet awaiting nursing home bed availability.   Antony Contras, MD     To contact Stroke Continuity provider,  please refer to [email or web address] After hours, contact General Neurology

## 2019-04-21 NOTE — Progress Notes (Signed)
Pt is not currently receiving IV medications. Spoke with Davy Pique, RN: requested she place consult if pt needs IV meds. Rationale: decrease risk of infection and vein preservation.

## 2019-04-21 NOTE — Progress Notes (Signed)
Inpatient Diabetes Program Recommendations  AACE/ADA: New Consensus Statement on Inpatient Glycemic Control (2015)  Target Ranges:  Prepandial:   less than 140 mg/dL      Peak postprandial:   less than 180 mg/dL (1-2 hours)      Critically ill patients:  140 - 180 mg/dL   Lab Results  Component Value Date   GLUCAP 87 04/21/2019   HGBA1C 9.8 (H) 03/11/2019    Review of Glycemic Control Results for OTHO, Joe Perry (MRN [id number]) as of 04/21/2019 08:56  Ref. Range 04/20/2019 06:15 04/20/2019 12:20 04/20/2019 15:42 04/20/2019 17:12 04/20/2019 21:30 04/21/2019 06:05 04/21/2019 06:47  Glucose-Capillary Latest Ref Range: 70 - 99 mg/dL 70 101 (H) 130 (H) 137 (H) 86 62 (L) 87   Diabetes history: DM 2 Outpatient Diabetes medications: Prescribed metformin not taking  Current orders for Inpatient glycemic control:  Novolog 0-15 units tid  Inpatient Diabetes Program Recommendations:    Hypoglycemia this am. Consider decreasing Novolog Correction to Sensitive 0-9 units tid.  Thanks,  Tama Headings RN, MSN, BC-ADM Inpatient Diabetes Coordinator Team Pager [phone] (8a-5p)

## 2019-04-21 NOTE — Progress Notes (Signed)
Occupational Therapy Treatment Patient Details Name: Joe Perry MRN: [id number] DOB: Jan 17, 1951 Today's Date: 04/21/2019    History of present illness 68 yo male s/p  R PCOM and R PCA occlusion s/p emergent thrombectomy on 7/6. Pt admitteed with L sided weakness, R gaze perference, L homonymous hemianopsia. 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   OT comments  Pt significantly improved compared to last OT visit.  He is now able to perform ADLs with mod - max A, and ambulate in hallway with mod A +2.  He demonstrates sustained attention, is oriented to person, and place.  He requires cues for problem solving, organization, sequencing, and motor planning deficits.  Today he is able to use Lt UE as an active assist during ADLs.  Continue to recommend SNF level rehab at discharge.   Follow Up Recommendations  SNF;Supervision/Assistance - 24 hour    Equipment Recommendations  None recommended by OT    Recommendations for Other Services      Precautions / Restrictions Precautions Precautions: Fall Precaution Comments: L hemianopsia; L inattention; confused, poor attention, perseverative        Mobility Bed Mobility Overal bed mobility: Needs Assistance Bed Mobility: Supine to Sit;Sit to Supine     Supine to sit: Min assist;+2 for safety/equipment Sit to supine: Supervision   General bed mobility comments: Pt was able to come to EOB with min hand held assist and min assist to sequence legs out of bed first then trunk.  Pt given extra time to attempt on his own, but seemed stuck due to motor planning issues. He was able to reposition back in bed with supervision.    Transfers Overall transfer level: Needs assistance Equipment used: 2 person hand held assist Transfers: Sit to/from Stand Sit to Stand: Min assist;Mod assist         General transfer comment: Min to mod assist to support trunk mostly for balance and to power up to come to standing.  Pt tends to pitch trunk  forward of feet in standing and with fatigue, leans left.     Balance Overall balance assessment: Needs assistance Sitting-balance support: Feet supported;Bilateral upper extremity supported;No upper extremity supported;Single extremity supported Sitting balance-[name]: Poor Sitting balance - Comments: at most min assist EOB as pt fatigues he leans more to the left, he also tends to engage his right hand on the rail and push to the left.   Postural control: Left lateral lean Standing balance support: Bilateral upper extremity supported;Single extremity supported Standing balance-[name]: Poor Standing balance comment: min to mod assist of two people standing at the sink preforming ALD task.  Pt leaning on his left arm , however, when he had to engage both hands, his balance improved.                             ADL either performed or assessed with clinical judgement   ADL Overall ADL's : Needs assistance/impaired     Grooming: Oral care;Minimal assistance;Standing Grooming Details (indicate cue type and reason): Pt requires min A for balance as well as assist for sequencing and organization.  He began to brush teeth without toothpaste on the brush, and no awareness of this deletion.       Lower Body Bathing: Maximal assistance;Sit to/from stand Lower Body Bathing Details (indicate cue type and reason): assist for thoroughness      Lower Body Dressing: Moderate assistance Lower Body Dressing  Details (indicate cue type and reason): Pt able to don socks supine with supervision.  However, when he attempted to don them while EOB, he required max A as he was unable to pull socks over forefoot due to amount of weight on foot when leaning forward.  He was unable to problem solve through and motor plan appropriate solutions  Toilet Transfer: Moderate assistance;+2 for safety/equipment;+2 for physical assistance;Ambulation;Comfort height toilet;BSC   Toileting- Clothing  Manipulation and Hygiene: Maximal assistance;Sit to/from stand Toileting - Clothing Manipulation Details (indicate cue type and reason): assist to doff undergarment.  Pt incontinent of stool, and required max A for clean up and peri care      Functional mobility during ADLs: Moderate assistance;+2 for physical assistance;+2 for safety/equipment       Vision   Visual Fields: Left visual field deficit Additional Comments: mod cues to scan to Lt    Perception     Praxis      Cognition Arousal/Alertness: Awake/alert Behavior During Therapy: Restless;Flat affect;Impulsive Overall Cognitive Status: Impaired/Different from baseline Area of Impairment: Attention;Memory;Following commands;Safety/judgement;Awareness;Problem solving;JFK Recovery Scale                   Current Attention Level: Sustained Memory: Decreased short-term memory;Decreased recall of precautions Following Commands: Follows one step commands inconsistently;Follows one step commands with increased time Safety/Judgement: Decreased awareness of safety;Decreased awareness of deficits Awareness: Intellectual Problem Solving: Slow processing;Decreased initiation;Difficulty sequencing;Requires verbal cues;Requires tactile cues General Comments: Pt, when given multiple options can accurately report his birthday.  He does take extra time to process and responds well to multimodal cues.  He has difficulty sequencing and motor planning and is unaware of his balance deficits (forward, left leaning posture) and needs multimodal cues to correct.  He is much more responsive, able to follow commands and attend to task more consistently  compared to when she last saw him.         Exercises     Shoulder Instructions       General Comments      Pertinent Vitals/ Pain       Pain Assessment: Faces Faces Pain Scale: No hurt  Home Living                                          Prior  Functioning/Environment              Frequency  Min 2X/week        Progress Toward Goals  OT Goals(current goals can now be found in the care plan section)  Progress towards OT goals: Progressing toward goals  Acute Rehab OT Goals Patient Stated Goal: unable to state, but agreeable to getting up and moving.  Plan Discharge plan remains appropriate    Co-evaluation    PT/OT/SLP Co-Evaluation/Treatment: Yes Reason for Co-Treatment: Complexity of the patient's impairments (multi-system involvement);Necessary to address cognition/behavior during functional activity;For patient/therapist safety;To address functional/ADL transfers PT goals addressed during session: Balance;Mobility/safety with mobility;Strengthening/ROM OT goals addressed during session: ADL's and self-care      AM-PAC OT "6 Clicks" Daily Activity     Outcome Measure   Help from another person eating meals?: A Lot Help from another person taking care of personal grooming?: A Lot Help from another person toileting, which includes using toliet, bedpan, or urinal?: A Lot Help from another person bathing (including washing, rinsing, drying)?: A Lot Help  from another person to put on and taking off regular upper body clothing?: A Lot Help from another person to put on and taking off regular lower body clothing?: A Lot 6 Click Score: 12    End of Session Equipment Utilized During Treatment: Gait belt  OT Visit Diagnosis: Cognitive communication deficit (R41.841) Symptoms and signs involving cognitive functions: Cerebral infarction   Activity Tolerance Patient tolerated treatment well   Patient Left in bed;with call bell/phone within reach;with nursing/sitter in room   Nurse Communication Mobility status        Time: 7409-9278 OT Time Calculation (min): 47 min  Charges: OT General Charges $OT Visit: 1 Visit OT Treatments $Self Care/Home Management : 8-22 mins  Lucille Passy, OTR/L [place] Pager [phone] Office [phone]    Lucille Passy M 04/21/2019, 1:55 PM

## 2019-04-22 LAB — GLUCOSE, CAPILLARY
Glucose-Capillary: 74 mg/dL (ref 70–99)
Glucose-Capillary: 116 mg/dL — ABNORMAL HIGH (ref 70–99)
Glucose-Capillary: 106 mg/dL — ABNORMAL HIGH (ref 70–99)
Glucose-Capillary: 137 mg/dL — ABNORMAL HIGH (ref 70–99)

## 2019-04-22 NOTE — Progress Notes (Signed)
STROKE TEAM PROGRESS NOTE   Interval history:  Patient is lying comfortably in bed.  No complaints.  No neurological changes.  Vital signs are stable.  Vitals:   04/21/19 1950 04/22/19 0027 04/22/19 0455 04/22/19 0815  BP: 138/86 (!) 141/83 (!) 144/109 (!) 130/94  Pulse: 96 90 84 92  Resp: 16 16 16 17   Temp: 97.7 F (36.5 C) 98 F (36.7 C) 97.9 F (36.6 C) 98.3 F (36.8 C)  TempSrc: Oral Oral Oral Oral  SpO2: 96% 100% 100% 100%  Weight:      Height:        CBC:  Recent Labs  Lab 04/18/19 0519 04/21/19 0957  WBC 5.5 4.4  HGB 11.9* 13.0  HCT 35.6* 39.0  MCV 91.8 92.9  PLT 253 423    Basic Metabolic Panel:  Recent Labs  Lab 04/18/19 0519 04/21/19 0957  NA 141 141  K 3.7 3.6  CL 107 106  CO2 24 25  GLUCOSE 95 109*  BUN 6* 8  CREATININE 0.79 0.80  CALCIUM 9.3 9.5   IMAGING past 24h No results found.    PHYSICAL EXAM        General - Well nourished, well developed, not in acute distress  Ophthalmologic - fundi not visualized due to noncooperation.  Cardiovascular - Regular rate and rhythm.  Mental Status -  Sleepy but arousable easiliy, orientated to self and place, but not orientated to age or time. Language exam not able to form full sentences, repeating words with perseveration and stuttering. Able to repeat words but not sentences  Cranial Nerves II - XII - II - left hemianopia. III, IV, VI - Extraocular movements intact but right gaze preference with slight limitation of left lateral gaze V - Facial sensation intact bilaterally. VII - Facial movement intact bilaterally. VIII - Hearing & vestibular intact bilaterally. X - Palate elevates symmetrically. XI - Chin turning & shoulder shrug intact bilaterally. XII - Tongue protrusion intact.  Motor Strength - [name] was normal in all extremities and pronator drift was absent.  Bulk was normal and fasciculations were absent.   Motor Tone - Muscle tone was assessed at the neck and  appendages and was normal.  Reflexes - [name] were symmetrical in all extremities and he had no pathological reflexes.  Sensory - Light touch, temperature/pinprick were assessed and were symmetrical.    Gait and Station - not tested   ASSESSMENT/PLAN [name] is a 68 y.o. 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 Old R frontal infarct.    CTA head & neck - R P1 occlusion. Small BA w/ high grade atheromatous narrowing. L ICA siphon stenosis.  CT perfusion 19 cc penumbra  Cerebral angio TICI 2b revascularization occluded R PCOM and P PCA. Underlying large vessel atherosclerosis   MRI Rt PCA infarct    CT repeat - 04/11/2019 - Evolving R PCA infarct. No progression. No hemorrhage  2D Echo EF 60 to 65%.   Recommend 30-day OP monitoring to rule out A. fib   HIV neg  LDL 101   HgbA1c 9.8  P2Y12 90  Lovenox 40 mg sq daily  for VTE prophylaxis  aspirin 81 mg daily and clopidogrel 75 mg daily prior to admission, put on  aspirin 325 mg daily and clopidogrel 75 mg daily. However, developed itchiness, plavix was discontinued. Now on ASA 81 and Brilinta 90 bid. Continue on discharge.  Therapy recommendations:  SNF  Disposition:  Pending  Hx stroke/TIA  11/2018 - presented for dizziness and unsteady, MRI showed right frontal and parietal small white matter infarcts. No LVO. Extensive SVD.  Put on DAPT.  Patient signed out [facility]  01/2016 - admitted for gait disability.  MRI negative for acute infarct.  Old infarcts B BG, thalami, L cerebellum.   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  However, pt developed vomiting episodes x 2 days. Improved after off abilify.  Hemichorea movement slowly improving  ?? PD with left hand tremor diagnosed in [place] - wife not sure if he was on any medication before   Cognitive decline  CT head unchanged; EEG no seizure  On seroquel QHs  Continue aricept 5mg  daily - increase to 10mg  in one month (05/16/2019) if tolerated  Hypertension  Home meds:  None listed  Stable 130-140s  Lotensin 5mg  daily stopped 8/8   If BP in the 150s, resume lower dose lotensin . BP goal normotensive  UTI  U/A WBC > 50  IV Rocephin 8/9 x 3 doses - completed  UCx w/ mult morphocytoes, recollection x 2 w/ multiple species  Hyperlipidemia  Home meds:  No statin listed  LDL 101, goal < 70  AST and ALT normalized   Continue Lipitor 40  Continue statin on discharge  Diabetes type II, uncontrolled  Home meds:  glucophage 1000 bid  HgbA1c 9.8, goal < 7.0  On metformin 1000mg  bid from 7/11  CBGs  SSI  Dysphagia, resolved . 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 After hours, contact General Neurology

## 2019-04-22 NOTE — Progress Notes (Signed)
Called to notify wife that he was on the mat beside his bed on his right knee.  His alarm sounded and nursing staff immediately entered room and found him on his right knee attempting to stand.  Said he did not fall but did was attempting to get up to go to the bathroom to urinate.  This nurse reminded him that he had a condom cath on and that he just needed to void and he laughed and said OK.  This nurse did discuss this with Director Darius, to ensure that this was not a fall and he agreed, as pt said he just got onto his knee to help him get up.  No signs of injury.  Denies complaints.  Manual BP following was 132/70, HR 92, Resp 18.

## 2019-04-23 LAB — GLUCOSE, CAPILLARY
Glucose-Capillary: 87 mg/dL (ref 70–99)
Glucose-Capillary: 95 mg/dL (ref 70–99)
Glucose-Capillary: 102 mg/dL — ABNORMAL HIGH (ref 70–99)
Glucose-Capillary: 128 mg/dL — ABNORMAL HIGH (ref 70–99)

## 2019-04-23 NOTE — Progress Notes (Signed)
Physical Therapy Treatment Patient Details Name: Joe Perry MRN: [id number] DOB: 16-Jan-1951 Today's Date: 04/23/2019    History of Present Illness 68 yo male s/p  R PCOM and R PCA occlusion s/p emergent thrombectomy on 7/6. Pt admitteed with L sided weakness, R gaze perference, L homonymous hemianopsia. 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    PT Comments    PT woke pt from sleeping and he was a bit more groggy than last session.  He was able to walk the unit and mobilize around his room with two person mod assist and support of a RW.  He continues to have significant physical and cognitive deficits that will require post acute rehab.  PT will continue to follow acutely for safe mobility progression  Follow Up Recommendations  SNF;Supervision/Assistance - 24 hour     Equipment Recommendations  Wheelchair (measurements PT);Wheelchair cushion (measurements PT);3in1 (PT)    Recommendations for Other Services   NA     Precautions / Restrictions Precautions Precautions: Fall Precaution Comments: L lateral lean, L hemianopsia, L inattention    Mobility  Bed Mobility Overal bed mobility: Needs Assistance Bed Mobility: Supine to Sit     Supine to sit: Min assist Sit to supine: Supervision   General bed mobility comments: When given extra time, some assist to initiate movement and multimodal cues, pt can be as good as min assist mostly to complete LEs over EOB and then hand held assist to support trunk to come up to sitting EOB. Pt able to return to bed with supervision and multimodal cues.   Transfers Overall transfer level: Needs assistance Equipment used: Rolling walker (2 wheeled) Transfers: Sit to/from Stand Sit to Stand: Mod assist;+2 physical assistance         General transfer comment: Two person mod assist to power up to standing and stabilize on RW.    Ambulation/Gait Ambulation/Gait assistance: +2 physical assistance;Mod assist Gait Distance (Feet): 100  Feet Assistive device: Rolling walker (2 wheeled) Gait Pattern/deviations: Step-through pattern;Scissoring;Staggering left;Drifts right/left;Narrow base of support Gait velocity: decreased Gait velocity interpretation: 1.31 - 2.62 ft/sec, indicative of limited community ambulator General Gait Details: Pt with near scissoring gait pattern due to left foot adducted toward right foot, drifts and leans to the left with assist for balance at trunk and to help control the RW.  Cues to stay inside of the RW.         Modified Rankin (Stroke Patients Only) Modified Rankin (Stroke Patients Only) Pre-Morbid Rankin Score: No symptoms Modified Rankin: Moderately severe disability     Balance Overall balance assessment: Needs assistance Sitting-balance support: Feet supported;Bilateral upper extremity supported Sitting balance-[name]: Poor Sitting balance - Comments: needs up to min assit especially for dynamic tasks. Postural control: Left lateral lean Standing balance support: Bilateral upper extremity supported;Single extremity supported Standing balance-[name]: Poor Standing balance comment: needs two person mod assist in standing today even with support of RW.                             Cognition Arousal/Alertness: Awake/alert Behavior During Therapy: Flat affect Overall Cognitive Status: Impaired/Different from baseline Area of Impairment: Attention;Memory;Following commands;Safety/judgement;Awareness;Problem solving                   Current Attention Level: Sustained Memory: Decreased short-term memory;Decreased recall of precautions Following Commands: Follows one step commands with increased time;Follows one step commands inconsistently Safety/Judgement: Decreased awareness of safety;Decreased  awareness of deficits Awareness: Intellectual Problem Solving: Decreased initiation;Slow processing;Difficulty sequencing;Requires tactile cues;Requires verbal  cues General Comments: Pt slow to respond to even basic cues.  At times he seems to have sustained attention, but he also perseverates on a command (not as much verbal perseveration today).  I asked him to kick his left leg and he continued to kick it even after cues to do something else or stop kicking his left leg.  A bit lethargic today.  I woke him from sleeping and am not sure how well he slept last night.       Exercises General Exercises - Lower Extremity Long Arc Quad: AROM;Both;20 reps        Pertinent Vitals/Pain Pain Assessment: Faces Pain Score: 0-No pain           PT Goals (current goals can now be found in the care plan section) Acute Rehab PT Goals Patient Stated Goal: unable to state, but agreeable to getting up and moving. Progress towards PT goals: Progressing toward goals    Frequency    Min 3X/week      PT Plan Current plan remains appropriate       AM-PAC PT "6 Clicks" Mobility   Outcome Measure  Help needed turning from your back to your side while in a flat bed without using bedrails?: A Little Help needed moving from lying on your back to sitting on the side of a flat bed without using bedrails?: A Little Help needed moving to and from a bed to a chair (including a wheelchair)?: A Lot Help needed standing up from a chair using your arms (e.g., wheelchair or bedside chair)?: A Lot Help needed to walk in hospital room?: A Lot Help needed climbing 3-5 steps with a railing? : Total 6 Click Score: 13    End of Session Equipment Utilized During Treatment: Gait belt Activity Tolerance: Patient tolerated treatment well Patient left: in bed;with call bell/phone within reach;with bed alarm set(bed in chair mode)   PT Visit Diagnosis: Muscle weakness (generalized) (M62.81);Hemiplegia and hemiparesis;Other abnormalities of gait and mobility (R26.89) Hemiplegia - Right/Left: Left Hemiplegia - dominant/non-dominant: Dominant Hemiplegia - caused by:  Cerebral infarction     Time: 1142-1209 PT Time Calculation (min) (ACUTE ONLY): 27 min  Charges:  $Gait Training: 23-37 mins            Nashiya Disbrow B. Zymire Turnbo, PT, DPT  Acute Rehabilitation [phone] pager [phone] office  @ [facility]: [phone]            04/23/2019, 12:55 PM

## 2019-04-23 NOTE — Progress Notes (Signed)
Chart reviewed for LOS. Patient is on the Difficult to place list for SNF; Financial counselor working with spouse, Insurance underwriter pending. Kathlee Nations SW  [phone]) following for progression of care. [facility] Supervisor

## 2019-04-23 NOTE — Progress Notes (Signed)
SLP Cancellation Note  Patient Details Name: Joe Perry MRN: [id number] DOB: 14-Apr-1951   Cancelled treatment:       Reason Eval/Treat Not Completed: Fatigue/lethargy limiting ability to participate(Pt was approached for SLP treatment but had received PT earlier and he was unable to maintain an adequate level of alertness to participate. SLP will re-attempt as able.)  Deronte Solis I. Hardin Negus, [place], [place] Office number [phone] Pager [phone]  Horton Marshall 04/23/2019, 12:18 PM

## 2019-04-23 NOTE — Progress Notes (Signed)
STROKE TEAM PROGRESS NOTE   Interval history:  Patient is lying comfortably in bed.  He continues to have left homonymous hemianopsia mild left-sided weakness and gaze palsy.  He follows simple commands.  Vital signs are stable.  No new changes.  Vitals:   04/22/19 1942 04/22/19 2329 04/23/19 0338 04/23/19 0727  BP: 127/86 (!) 134/96 (!) 140/91 139/73  Pulse: 86 87 88 72  Resp: 16 15 15 16   Temp: 97.7 F (36.5 C) 98.2 F (36.8 C) 97.8 F (36.6 C) 97.9 F (36.6 C)  TempSrc: Oral Oral Oral Oral  SpO2: 100% 100% 100%   Weight:      Height:        CBC:  Recent Labs  Lab 04/18/19 0519 04/21/19 0957  WBC 5.5 4.4  HGB 11.9* 13.0  HCT 35.6* 39.0  MCV 91.8 92.9  PLT 253 675    Basic Metabolic Panel:  Recent Labs  Lab 04/18/19 0519 04/21/19 0957  NA 141 141  K 3.7 3.6  CL 107 106  CO2 24 25  GLUCOSE 95 109*  BUN 6* 8  CREATININE 0.79 0.80  CALCIUM 9.3 9.5   IMAGING past 24h No results found.    PHYSICAL EXAM      General - Well nourished, well developed, not in acute distress  Ophthalmologic - fundi not visualized due to noncooperation.  Cardiovascular - Regular rate and rhythm.  Mental Status -  Sleepy but arousable easiliy, orientated to self and place, but not orientated to age or time. Language exam not able to form full sentences, repeating words with perseveration and stuttering. Able to repeat words but not sentences  Cranial Nerves II - XII - II - left hemianopia. III, IV, VI - Extraocular movements intact but right gaze preference with slight limitation of left lateral gaze V - Facial sensation intact bilaterally. VII - Facial movement intact bilaterally. VIII - Hearing & vestibular intact bilaterally. X - Palate elevates symmetrically. XI - Chin turning & shoulder shrug intact bilaterally. XII - Tongue protrusion intact.  Motor Strength - [name] was normal in right sided extremities and mild 4/5 weakness on the left.  Mild  weakness of left grip. Bulk was normal and fasciculations were absent.    Mild action tremor of the left upper and lower extremity.  No definite choreiform movements noted Motor Tone - Muscle tone was assessed at the neck and appendages and was normal.  Reflexes - [name] were symmetrical in all extremities and he had no pathological reflexes.  Sensory - Light touch, temperature/pinprick were assessed and were symmetrical.    Gait and Station - not tested   ASSESSMENT/PLAN [name] is a 68 y.o. 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 Old R frontal infarct.    CTA head & neck - R P1 occlusion. Small BA w/ high grade atheromatous narrowing. L ICA siphon stenosis.  CT perfusion 19 cc penumbra  Cerebral angio TICI 2b revascularization occluded R PCOM and P PCA. Underlying large vessel atherosclerosis   MRI Rt PCA infarct    CT repeat - 04/11/2019 - Evolving R PCA infarct. No progression. No hemorrhage  2D Echo EF 60 to 65%.   Recommend 30-day OP monitoring to rule out A. fib  HIV neg  LDL 101   HgbA1c 9.8  P2Y12 90  Lovenox 40 mg sq daily  for VTE prophylaxis  aspirin 81 mg daily and clopidogrel 75 mg daily prior to admission, put on aspirin 325 mg daily and clopidogrel 75 mg daily. However, developed itchiness, plavix was discontinued. Now on ASA 81 and Brilinta 90 bid. Continue on discharge.  Therapy recommendations:  SNF  Disposition:  Pending  Hx stroke/TIA  11/2018 - presented for dizziness and unsteady, MRI showed right frontal and parietal small white matter infarcts. No LVO. Extensive SVD.  Put on DAPT.  Patient signed out [facility]  01/2016 - admitted for gait disability.  MRI negative for acute infarct.  Old infarcts B BG, thalami, L  cerebellum.   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 However, pt developed vomiting episodes x 2 days. Improved after off abilify.  Hemichorea movement slowly improving  ?? PD with left hand tremor diagnosed in [place] - wife not sure if he was on any medication before   Cognitive decline  CT head unchanged; EEG no seizure  On seroquel QHs  Continue aricept 5mg  daily - increase to 10mg  in one month (05/16/2019) if tolerated  Hypertension  Home meds:  None listed  Stable 130-140s  Lotensin 5mg  daily stopped 8/8   If BP in the 150s, resume lower dose lotensin . BP goal normotensive  UTI, resolved  U/A WBC > 50  IV Rocephin 8/9 x 3 doses - completed  UCx w/ mult morphocytoes, recollection x 2 w/ multiple species  Hyperlipidemia->stable  Home meds:  No statin listed  LDL 101, goal < 70  AST and ALT normalized   Continue Lipitor 40  Continue statin on discharge  Diabetes type II, uncontrolled->stable  Home meds:  glucophage 1000 bid  HgbA1c 9.8, goal < 7.0  On metformin 1000mg  bid from 7/11  CBGs  SSI  Dysphagia, resolved . 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  Patient is medically stable for transfer to skilled nursing facility but is a difficult placement due to his insurance.  Antony Contras, MD     To contact Stroke Continuity provider, please refer to [email or web address] After hours, contact General Neurology

## 2019-04-24 LAB — GLUCOSE, CAPILLARY
Glucose-Capillary: 80 mg/dL (ref 70–99)
Glucose-Capillary: 109 mg/dL — ABNORMAL HIGH (ref 70–99)
Glucose-Capillary: 116 mg/dL — ABNORMAL HIGH (ref 70–99)
Glucose-Capillary: 79 mg/dL (ref 70–99)

## 2019-04-24 NOTE — Evaluation (Signed)
Clinical/Bedside Swallow Evaluation Patient Details  Name: Joe Perry MRN: [id number] Date of Birth: 10/03/1950  Today's Date: 04/24/2019 Time: SLP Start Time (ACUTE ONLY): 1500 SLP Stop Time (ACUTE ONLY): 1525 SLP Time Calculation (min) (ACUTE ONLY): 25 min  Past Medical History:  Past Medical History:  Diagnosis Date  . Diabetes mellitus without complication ([place])   . Hypertension   . Stroke [facility])    Past Surgical History:  Past Surgical History:  Procedure Laterality Date  . IR ANGIO INTRA EXTRACRAN SEL COM CAROTID INNOMINATE UNI L MOD SED  03/10/2019  . IR ANGIO VERTEBRAL SEL SUBCLAVIAN INNOMINATE BILAT MOD SED  03/10/2019  . IR CT HEAD LTD  03/10/2019  . IR PERCUTANEOUS ART THROMBECTOMY/INFUSION INTRACRANIAL INC DIAG ANGIO  03/10/2019  . RADIOLOGY WITH ANESTHESIA N/A 03/10/2019   Procedure: IR WITH ANESTHESIA/CODE STROKE;  Surgeon: Radiologist, Medication, MD;  Location: [facility];  Service: Radiology;  Laterality: N/A;   HPI:  68 year old male admitted 03/10/2019 with left weakness, unsteady gait, right gaze, left hemianopsia. PMH: CVA, HTN, DM. CT negative. MRI Ischemic infarct of the right posterior cerebral artery, territory, roughly corresponding to the ischemic volume. Recent reports (8/19) from nursing are that patient is more delayed with mastication, holds food in his mouth then eventually starts coughing. Repeat BSE was ordered.   Assessment / Plan / Recommendation Clinical Impression  Patient presents with what appears to be a primary cognitive based oral dysphagia and a mild-moderate pharyngeal dysphagia. He had poor awareness or attention to boluses of all consistencies and would hold liquids, puree and regular solids in mouth,with very prolonged mastication (over 4 minutes at times) and requiring cues to swallow. SLP observed patient to continuously "chew" even when he had minimal to no PO's in oral cavity. Delayed coughing occured from him holding solids and liquids in oral  cavity for prolonged period of time. SLP Visit Diagnosis: Dysphagia, oropharyngeal phase (R13.12)    Aspiration Risk  Moderate aspiration risk    Diet Recommendation Dysphagia 1 (Puree);Thin liquid   Liquid Administration via: Cup;Straw Medication Administration: Crushed with puree Supervision: Staff to assist with self feeding;Full supervision/cueing for compensatory strategies Compensations: Slow rate;Small sips/bites;Minimize environmental distractions;Lingual sweep for clearance of pocketing Postural Changes: Seated upright at 90 degrees    Other  Recommendations Oral Care Recommendations: Oral care BID Other Recommendations: Have oral suction available   Follow up Recommendations Skilled Nursing facility      Frequency and Duration min 2x/week  1 week       Prognosis Prognosis for Safe Diet Advancement: Fair Barriers to Reach Goals: Cognitive deficits;Severity of deficits      Swallow Study   General Date of Onset: 03/10/19 HPI: 68 year old male admitted 03/10/2019 with left weakness, unsteady gait, right gaze, left hemianopsia. PMH: CVA, HTN, DM. CT negative. MRI Ischemic infarct of the right posterior cerebral artery, territory, roughly corresponding to the ischemic volume. Recent reports (8/19) from nursing are that patient is more delayed with mastication, holds food in his mouth then eventually starts coughing. Repeat BSE was ordered. Type of Study: Bedside Swallow Evaluation Previous Swallow Assessment: 03/13/19 Diet Prior to this Study: Dysphagia 3 (soft);Thin liquids Temperature Spikes Noted: No Respiratory Status: Room air Behavior/Cognition: Alert;Requires cueing Oral Cavity Assessment: Within Functional Limits Oral Care Completed by SLP: Yes Oral Cavity - Dentition: Adequate natural dentition Vision: Functional for self-feeding Self-Feeding Abilities: Needs assist;Needs set up Patient Positioning: Upright in bed Baseline Vocal Quality: Not observed Volitional  Cough: Weak  Volitional Swallow: Unable to elicit    Oral/Motor/Sensory Function Overall Oral Motor/Sensory Function: Mild impairment Facial ROM: Reduced left Facial Strength: Reduced left   Ice Chips     Thin Liquid Thin Liquid: Impaired Presentation: Cup;Straw Oral Phase Impairments: Poor awareness of bolus Oral Phase Functional Implications: Prolonged oral transit;Oral holding Pharyngeal  Phase Impairments: Suspected delayed Swallow;Cough - Delayed Other Comments: delayed cough after patient holding entire liquid bolus in mouth for extended amount of time.    Nectar Thick     Honey Thick     Puree Puree: Impaired Presentation: Spoon Oral Phase Impairments: Poor awareness of bolus Oral Phase Functional Implications: Prolonged oral transit   Solid     Solid: Impaired Oral Phase Impairments: Impaired mastication Oral Phase Functional Implications: Impaired mastication;Prolonged oral transit;Oral holding Pharyngeal Phase Impairments: Suspected delayed Swallow;Cough - Delayed Other Comments: delayed cough after patient holding bolus as well as prolonged mastication with appearance of patient losing attention to bolus, requiring cues to swallow      [name] 04/24/2019,6:03 PM   [name], MA, CCC-SLP Speech Therapy [facility] Acute Rehab Pager: [phone]

## 2019-04-24 NOTE — Progress Notes (Signed)
  Patient spouse want to see SW /case manager about pt care and transition. Also pt need speech evaluation as pt cough with thin liquid and has hard time swallowing liquid.  Attempted several time to get oob and continue to each despite benadryl given pt looks so withdrawn this am after talking to spouse on the phone. Needs depression evaluation.

## 2019-04-24 NOTE — Progress Notes (Signed)
STROKE TEAM PROGRESS NOTE   Interval history:  Patient has not been eating well.  He appears depressed.  Patient's spouse wants to meet with social worker to discuss transition of care  Vitals:   04/23/19 2337 04/24/19 0349 04/24/19 0919 04/24/19 1131  BP: 108/68 115/77 123/82 114/83  Pulse: 89 79 79 97  Resp: 17 17 16 16   Temp: 97.8 F (36.6 C) 97.6 F (36.4 C) 98.7 F (37.1 C) 99.1 F (37.3 C)  TempSrc: Oral Oral Oral Oral  SpO2:   98% 99%  Weight:      Height:        CBC:  Recent Labs  Lab 04/18/19 0519 04/21/19 0957  WBC 5.5 4.4  HGB 11.9* 13.0  HCT 35.6* 39.0  MCV 91.8 92.9  PLT 253 [id number]    Basic Metabolic Panel:  Recent Labs  Lab 04/18/19 0519 04/21/19 0957  NA 141 141  K 3.7 3.6  CL 107 106  CO2 24 25  GLUCOSE 95 109*  BUN 6* 8  CREATININE 0.79 0.80  CALCIUM 9.3 9.5   IMAGING past 24h No results found.    PHYSICAL EXAM      General - Well nourished, well developed, not in acute distress  Ophthalmologic - fundi not visualized due to noncooperation.  Cardiovascular - Regular rate and rhythm.  Mental Status -  Sleepy but arousable easiliy, orientated to self and place, but not orientated to age or time. Language exam not able to form full sentences, repeating words with perseveration and stuttering. Able to repeat words but not sentences  Cranial Nerves II - XII - II - left hemianopia. III, IV, VI - Extraocular movements intact but right gaze preference with slight limitation of left lateral gaze V - Facial sensation intact bilaterally. VII - Facial movement intact bilaterally. VIII - Hearing & vestibular intact bilaterally. X - Palate elevates symmetrically. XI - Chin turning & shoulder shrug intact bilaterally. XII - Tongue protrusion intact.  Motor Strength - [name] was normal in right sided extremities and mild 4/5 weakness on the left.  Mild weakness of left grip. Bulk was normal and fasciculations were absent.     Mild action tremor of the left upper and lower extremity.  No definite choreiform movements noted Motor Tone - Muscle tone was assessed at the neck and appendages and was normal.  Reflexes - [name] were symmetrical in all extremities and he had no pathological reflexes.  Sensory - Light touch, temperature/pinprick were assessed and were symmetrical.    Gait and Station - not tested   ASSESSMENT/PLAN [name] is a 68 y.o. 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 Old R frontal infarct.    CTA head & neck - R P1 occlusion. Small BA w/ high grade atheromatous narrowing. L ICA siphon stenosis.  CT perfusion 19 cc penumbra  Cerebral angio TICI 2b revascularization occluded R PCOM and P PCA. Underlying large vessel atherosclerosis   MRI Rt PCA infarct    CT repeat - 04/11/2019 - Evolving R PCA infarct. No progression. No hemorrhage  2D Echo EF 60 to 65%.   Recommend 30-day OP monitoring to rule out A. fib   HIV neg  LDL 101   HgbA1c 9.8  P2Y12  90  Lovenox 40 mg sq daily  for VTE prophylaxis  aspirin 81 mg daily and clopidogrel 75 mg daily prior to admission, put on aspirin 325 mg daily and clopidogrel 75 mg daily. However, developed itchiness, plavix was discontinued. Now on ASA 81 and Brilinta 90 bid. Continue on discharge.  Therapy recommendations:  SNF  Disposition:  Pending  Hx stroke/TIA  11/2018 - presented for dizziness and unsteady, MRI showed right frontal and parietal small white matter infarcts. No LVO. Extensive SVD.  Put on DAPT.  Patient signed out [facility]  01/2016 - admitted for gait disability.  MRI negative for acute infarct.  Old infarcts B BG, thalami, L cerebellum.   Hemichorea-Hemiballismus Syndrome, improving  Likely due to  right thalamic involvement of current stroke  Semi-involuntary during awake, absent during sleep  Not able to afford tetrabenazine  Put on abilify 5->10mg . However, pt developed vomiting episodes x 2 days. Improved after off abilify.  Hemichorea movement slowly improving  ?? PD with left hand tremor diagnosed in [place] - wife not sure if he was on any medication before   Cognitive decline  CT head unchanged; EEG no seizure  On seroquel QHs  Continue aricept 5mg  daily - increase to 10mg  in one month (05/16/2019) if tolerated  Hypertension  Home meds:  None listed  Stable 130-140s  Lotensin 5mg  daily stopped 8/8   If BP in the 150s, resume lower dose lotensin . BP goal normotensive  UTI, resolved  U/A WBC > 50  IV Rocephin 8/9 x 3 doses - completed  UCx w/ mult morphocytoes, recollection x 2 w/ multiple species  Hyperlipidemia->stable  Home meds:  No statin listed  LDL 101, goal < 70  AST and ALT normalized   Continue Lipitor 40  Continue statin on discharge  Diabetes type II, uncontrolled->stable  Home meds:  glucophage 1000 bid  HgbA1c 9.8, goal < 7.0  On metformin 1000mg  bid from 7/11  CBGs  SSI  Dysphagia, resolved . 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  Will check labs tomorrow.  Patient is difficult to place  Antony Contras, MD     To contact Stroke Continuity provider, please refer to [email or web address] After hours, contact General Neurology

## 2019-04-24 NOTE — Plan of Care (Signed)
  Problem: Nutrition: Goal: Risk of aspiration will decrease Outcome: Progressing Note:  Pt needs SLP  evaluation for thin liquids ,as pt cough with thin liquid and hoards it in mouth  chew on it as if he is taking po solids. Takes longer time to swallow  thin liquids.

## 2019-04-25 LAB — GLUCOSE, CAPILLARY
Glucose-Capillary: 78 mg/dL (ref 70–99)
Glucose-Capillary: 93 mg/dL (ref 70–99)
Glucose-Capillary: 78 mg/dL (ref 70–99)
Glucose-Capillary: 79 mg/dL (ref 70–99)

## 2019-04-25 LAB — COMPREHENSIVE METABOLIC PANEL
ALT: 25 U/L (ref 0–44)
AST: 22 U/L (ref 15–41)
Albumin: 3.5 g/dL (ref 3.5–5.0)
Alkaline Phosphatase: 74 U/L (ref 38–126)
Anion gap: 12 (ref 5–15)
BUN: 10 mg/dL (ref 8–23)
CO2: 21 mmol/L — ABNORMAL LOW (ref 22–32)
Calcium: 9.3 mg/dL (ref 8.9–10.3)
Chloride: 108 mmol/L (ref 98–111)
Creatinine, Ser: 0.85 mg/dL (ref 0.61–1.24)
GFR calc Af Amer: >60 mL/min (ref >60)
GFR calc non Af Amer: >60 mL/min (ref >60)
Glucose, Bld: 79 mg/dL (ref 70–99)
Potassium: 3.8 mmol/L (ref 3.5–5.1)
Sodium: 141 mmol/L (ref 135–145)
Total Bilirubin: 1.3 mg/dL — ABNORMAL HIGH (ref 0.3–1.2)
Total Protein: 6.4 g/dL — ABNORMAL LOW (ref 6.5–8.1)

## 2019-04-25 LAB — CBC WITH DIFFERENTIAL/PLATELET
Abs Immature Granulocytes: 0.01 10*3/uL (ref 0.00–0.07)
Basophils Absolute: 0 10*3/uL (ref 0.0–0.1)
Basophils Relative: 1 %
Eosinophils Absolute: 0.1 10*3/uL (ref 0.0–0.5)
Eosinophils Relative: 3 %
HCT: 36.6 % — ABNORMAL LOW (ref 39.0–52.0)
Hemoglobin: 12.2 g/dL — ABNORMAL LOW (ref 13.0–17.0)
Immature Granulocytes: 0 %
Lymphocytes Relative: 34 %
Lymphs Abs: 1.4 10*3/uL (ref 0.7–4.0)
MCH: 31.5 pg (ref 26.0–34.0)
MCHC: 33.3 g/dL (ref 30.0–36.0)
MCV: 94.6 fL (ref 80.0–100.0)
Monocytes Absolute: 0.3 10*3/uL (ref 0.1–1.0)
Monocytes Relative: 8 %
Neutro Abs: 2.3 10*3/uL (ref 1.7–7.7)
Neutrophils Relative %: 54 %
Platelets: 262 10*3/uL (ref 150–400)
RBC: 3.87 MIL/uL — ABNORMAL LOW (ref 4.22–5.81)
RDW: 13.2 % (ref 11.5–15.5)
WBC: 4.2 10*3/uL (ref 4.0–10.5)
nRBC: 0 % (ref 0.0–0.2)

## 2019-04-25 NOTE — Progress Notes (Signed)
Physical Therapy Treatment Patient Details Name: Joe Perry MRN: [id number] DOB: 26-Jul-1951 Today's Date: 04/25/2019    History of Present Illness 68 yo male s/p  R PCOM and R PCA occlusion s/p emergent thrombectomy on 7/6. Pt admitteed with L sided weakness, R gaze perference, L homonymous hemianopsia. 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    PT Comments    Pt is showing improved ability to follow commands this session. He was able to progress OOB and ambulate in hallway with mod A +2 for safety and stability. Patient would benefit from continued skilled PT to maximize safety with mobility and functional independence. Will continue to follow acutely.     Follow Up Recommendations  SNF;Supervision/Assistance - 24 hour     Equipment Recommendations  Wheelchair (measurements PT);Wheelchair cushion (measurements PT);3in1 (PT)    Recommendations for Other Services       Precautions / Restrictions Precautions Precautions: Fall Precaution Comments: L lateral lean, L hemianopsia, L inattention Restrictions Weight Bearing Restrictions: No    Mobility  Bed Mobility Overal bed mobility: Needs Assistance Bed Mobility: Supine to Sit     Supine to sit: Min assist     General bed mobility comments: Multimodal cues for sequencing and technique. Pt able to progress to EOB with HOB elevated, use of bed rails, and increased time and effort.  Transfers Overall transfer level: Needs assistance Equipment used: 1 person hand held assist;2 person hand held assist Transfers: Sit to/from Stand Sit to Stand: Mod assist;+2 physical assistance Stand pivot transfers: +2 physical assistance;Mod assist       General transfer comment: Two person HHA initially to perform pivot transfer to recliner chair. Once in hall pt was able to stand with 1 person HHA on the L and hand rail on R  Ambulation/Gait Ambulation/Gait assistance: +2 physical assistance;Mod assist Gait Distance (Feet): 80  Feet Assistive device: 2 person hand held assist Gait Pattern/deviations: Step-through pattern;Scissoring;Staggering left;Drifts right/left;Narrow base of support;Decreased step length - left Gait velocity: decreased   General Gait Details: Pt ambulated 20 ft with HHA on L, rail on R, and chair to follow. Pt progressed to 2 person HHA in hall. Pt leans L and R and required mod A to balance. VC for increased step length on L and wider BOS.   Stairs             Wheelchair Mobility    Modified Rankin (Stroke Patients Only) Modified Rankin (Stroke Patients Only) Pre-Morbid Rankin Score: No symptoms Modified Rankin: Moderately severe disability     Balance Overall balance assessment: Needs assistance Sitting-balance support: Feet supported;Bilateral upper extremity supported Sitting balance-[name]: Poor Sitting balance - Comments: needs up to min assit especially for dynamic tasks. Postural control: Left lateral lean Standing balance support: Bilateral upper extremity supported;Single extremity supported Standing balance-[name]: Poor Standing balance comment: needs two person mod assist in standing today even with support of RW.                             Cognition Arousal/Alertness: Awake/alert Behavior During Therapy: Flat affect Overall Cognitive Status: Impaired/Different from baseline Area of Impairment: Attention;Memory;Following commands;Safety/judgement;Awareness;Problem solving                   Current Attention Level: Sustained Memory: Decreased short-term memory;Decreased recall of precautions Following Commands: Follows one step commands with increased time;Follows one step commands consistently Safety/Judgement: Decreased awareness of safety;Decreased awareness of deficits  Awareness: Intellectual Problem Solving: Decreased initiation;Slow processing;Difficulty sequencing;Requires tactile cues;Requires verbal cues General Comments:  Pt following single step commands consistantly today. He is mostly non verbal, but did state "OK" twice when asked a question.      Exercises      General Comments        Pertinent Vitals/Pain Pain Assessment: No/denies pain    Home Living                      Prior Function            PT Goals (current goals can now be found in the care plan section) Acute Rehab PT Goals Patient Stated Goal: unable to state, but agreeable to getting up and moving. PT Goal Formulation: Patient unable to participate in goal setting Time For Goal Achievement: 05/05/19 Potential to Achieve Goals: Fair Progress towards PT goals: Progressing toward goals    Frequency    Min 3X/week      PT Plan Current plan remains appropriate    Co-evaluation              AM-PAC PT "6 Clicks" Mobility   Outcome Measure  Help needed turning from your back to your side while in a flat bed without using bedrails?: A Little Help needed moving from lying on your back to sitting on the side of a flat bed without using bedrails?: A Little Help needed moving to and from a bed to a chair (including a wheelchair)?: A Lot Help needed standing up from a chair using your arms (e.g., wheelchair or bedside chair)?: A Lot Help needed to walk in hospital room?: A Lot Help needed climbing 3-5 steps with a railing? : Total 6 Click Score: 13    End of Session Equipment Utilized During Treatment: Gait belt Activity Tolerance: Patient tolerated treatment well Patient left: with call bell/phone within reach;in chair;with chair alarm set(bed in chair mode) Nurse Communication: Mobility status PT Visit Diagnosis: Muscle weakness (generalized) (M62.81);Hemiplegia and hemiparesis;Other abnormalities of gait and mobility (R26.89) Hemiplegia - Right/Left: Left Hemiplegia - dominant/non-dominant: Dominant Hemiplegia - caused by: Cerebral infarction     Time: 1430-1456 PT Time Calculation (min) (ACUTE  ONLY): 26 min  Charges:  $Gait Training: 8-22 mins $Neuromuscular Re-education: 8-22 mins                     Benjiman Core, [place] Pager [phone] Acute Rehab   Allena Katz 04/25/2019, 3:45 PM

## 2019-04-25 NOTE — Progress Notes (Signed)
STROKE TEAM PROGRESS NOTE   Interval history:  Patient is lying in bed.  Is alert and cooperative and follows commands.  Continues to have left-sided visual field loss as well as mild left-sided weakness and ataxia.  CBC is normal.  Complete metabolic panel is also unremarkable except for borderline total proteins and bilirubin  Vitals:   04/24/19 2014 04/25/19 0355 04/25/19 0944 04/25/19 1130  BP: (!) 145/88 116/78 140/86 139/79  Pulse: 92 88 90 85  Resp: 17 17  16   Temp: 98.5 F (36.9 C) 98.2 F (36.8 C) 97.8 F (36.6 C) 97.7 F (36.5 C)  TempSrc: Oral Oral Oral Oral  SpO2:   98% 100%  Weight:      Height:        CBC:  Recent Labs  Lab 04/21/19 0957 04/25/19 0739  WBC 4.4 4.2  NEUTROABS  --  2.3  HGB 13.0 12.2*  HCT 39.0 36.6*  MCV 92.9 94.6  PLT 292 99991111    Basic Metabolic Panel:  Recent Labs  Lab 04/21/19 0957 04/25/19 0739  NA 141 141  K 3.6 3.8  CL 106 108  CO2 25 21*  GLUCOSE 109* 79  BUN 8 10  CREATININE 0.80 0.85  CALCIUM 9.5 9.3   IMAGING past 24h No results found.    PHYSICAL EXAM      General - Well nourished, well developed, not in acute distress  Ophthalmologic - fundi not visualized due to noncooperation.  Cardiovascular - Regular rate and rhythm.  Mental Status -  Awake and alert, orientated to self and place, but not orientated to age or time. Language exam not able to form full sentences, repeating words with perseveration and stuttering. Able to repeat words but not sentences  Cranial Nerves II - XII - II - left dense homonymous hemianopia. III, IV, VI - Extraocular movements intact but right gaze preference with slight limitation of left lateral gaze V - Facial sensation intact bilaterally. VII - Facial movement intact bilaterally. VIII - Hearing & vestibular intact bilaterally. X - Palate elevates symmetrically. XI - Chin turning & shoulder shrug intact bilaterally. XII - Tongue protrusion intact.  Motor Strength - The  patient's strength was normal in right sided extremities and mild 4/5 weakness on the left.  Mild weakness of left grip. Bulk was normal and fasciculations were absent.    Mild action tremor of the left upper and lower extremity.  No definite choreiform movements noted Motor Tone - Muscle tone was assessed at the neck and appendages and was normal.  Reflexes - [name] were symmetrical in all extremities and he had no pathological reflexes.  Sensory - Light touch, temperature/pinprick were assessed and were symmetrical.   Coordination impaired left finger-to-nose and knee to heel coordination. Gait and Station - not tested   ASSESSMENT/PLAN Joe Perry is a 68 y.o. 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 Old R frontal infarct.    CTA head & neck - R P1 occlusion. Small BA w/ high grade atheromatous narrowing. L ICA siphon stenosis.  CT perfusion 19 cc penumbra  Cerebral angio TICI 2b revascularization occluded R PCOM and P PCA. Underlying large vessel atherosclerosis   MRI Rt PCA infarct    CT repeat - 04/11/2019 -  Evolving R PCA infarct. No progression. No hemorrhage  2D Echo EF 60 to 65%.   Recommend 30-day OP monitoring to rule out A. fib   HIV neg  LDL 101   HgbA1c 9.8  P2Y12 90  Lovenox 40 mg sq daily  for VTE prophylaxis  aspirin 81 mg daily and clopidogrel 75 mg daily prior to admission, put on aspirin 325 mg daily and clopidogrel 75 mg daily. However, developed itchiness, plavix was discontinued. Now on ASA 81 and Brilinta 90 bid. Continue on discharge.  Therapy recommendations:  SNF  Disposition:  Pending  Hx stroke/TIA  11/2018 - presented for dizziness and unsteady, MRI showed right frontal and parietal small white matter infarcts. No LVO.  Extensive SVD.  Put on DAPT.  Patient signed out [facility]  01/2016 - admitted for gait disability.  MRI negative for acute infarct.  Old infarcts B BG, thalami, L cerebellum.   Left upper and lower extremity involuntary movements-tremor versus ataxia  Likely due to right thalamic involvement of current stroke  Semi-involuntary during awake, absent during sleep  Not able to afford tetrabenazine  Put on abilify 5->10mg . However, pt developed vomiting episodes x 2 days. Improved after off abilify.  Hemichorea movement slowly improving  ?? PD with left hand tremor diagnosed in [place] - wife not sure if he was on any medication before   Cognitive decline  CT head unchanged; EEG no seizure  On seroquel QHs  Continue aricept 5mg  daily - increase to 10mg  in one month (05/16/2019) if tolerated  Hypertension  Home meds:  None listed  Stable 130-140s  Lotensin 5mg  daily stopped 8/8   If BP in the 150s, resume lower dose lotensin . BP goal normotensive  UTI, resolved  U/A WBC > 50  IV Rocephin 8/9 x 3 doses - completed  UCx w/ mult morphocytoes, recollection x 2 w/ multiple species  Hyperlipidemia->stable  Home meds:  No statin listed  LDL 101, goal < 70  AST and ALT normalized   Continue Lipitor 40  Continue statin on discharge  Diabetes type II, uncontrolled->stable  Home meds:  glucophage 1000 bid  HgbA1c 9.8, goal < 7.0  On metformin 1000mg  bid from 7/11  CBGs  SSI  Dysphagia, resolved . 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    Patient is difficult to place  Antony Contras, MD     To contact  Stroke Continuity provider, please refer to [email or web address] After hours, contact General Neurology

## 2019-04-26 LAB — GLUCOSE, CAPILLARY
Glucose-Capillary: 111 mg/dL — ABNORMAL HIGH (ref 70–99)
Glucose-Capillary: 153 mg/dL — ABNORMAL HIGH (ref 70–99)
Glucose-Capillary: 112 mg/dL — ABNORMAL HIGH (ref 70–99)
Glucose-Capillary: 117 mg/dL — ABNORMAL HIGH (ref 70–99)

## 2019-04-26 NOTE — Progress Notes (Signed)
STROKE TEAM PROGRESS NOTE   Interval history:  Patient wife is at bedside. Wife said pt has some improvement mentally than before but still not quite clear headed. Left side choreiform activity less than before.   Vitals:   04/25/19 2359 04/26/19 0639 04/26/19 0819 04/26/19 1204  BP: 131/87 123/79 104/74 (!) 146/90  Pulse: 92 74 (!) 58 83  Resp: 18 18 18 20   Temp: 98.3 F (36.8 C) 98 F (36.7 C) (!) 97 F (36.1 C) (!) 97.5 F (36.4 C)  TempSrc: Oral Oral Oral Axillary  SpO2: 98% 99% 100% 100%  Weight:      Height:        CBC:  Recent Labs  Lab 04/21/19 0957 04/25/19 0739  WBC 4.4 4.2  NEUTROABS  --  2.3  HGB 13.0 12.2*  HCT 39.0 36.6*  MCV 92.9 94.6  PLT 292 99991111    Basic Metabolic Panel:  Recent Labs  Lab 04/21/19 0957 04/25/19 0739  NA 141 141  K 3.6 3.8  CL 106 108  CO2 25 21*  GLUCOSE 109* 79  BUN 8 10  CREATININE 0.80 0.85  CALCIUM 9.5 9.3   IMAGING past 24h No results found.    PHYSICAL EXAM      General - Well nourished, well developed, not in acute distress  Ophthalmologic - fundi not visualized due to noncooperation.  Cardiovascular - Regular rate and rhythm.  Mental Status -  Awake and alert, orientated to self and place, but not orientated to age or time. Language exam not able to form full sentences, repeating words with perseveration and stuttering. Able to repeat words but not sentences. Psychomotor slowing.   Cranial Nerves II - XII - II - left dense homonymous hemianopia. III, IV, VI - Extraocular movements intact but right gaze preference with slight limitation of left lateral gaze V - Facial sensation intact bilaterally. VII - Facial movement intact bilaterally. VIII - Hearing & vestibular intact bilaterally. X - Palate elevates symmetrically. XI - Chin turning & shoulder shrug intact bilaterally. XII - Tongue protrusion intact.  Motor Strength - [name] was normal in right sided extremities and mild 4/5  weakness on the left.  Mild weakness of left grip. Bulk was normal and fasciculations were absent.    Mild action tremor of the left upper and lower extremity.  No definite choreiform movements noted Motor Tone - Muscle tone was assessed at the neck and appendages and was normal.  Reflexes - [name] were symmetrical in all extremities and he had no pathological reflexes.  Sensory - Light touch, temperature/pinprick were assessed and were symmetrical.   Coordination impaired left finger-to-nose and knee to heel coordination. Gait and Station - not tested   ASSESSMENT/PLAN [name] is a 68 y.o. 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 Old R frontal infarct.    CTA head & neck - R P1 occlusion. Small BA w/ high grade atheromatous narrowing. L ICA siphon stenosis.  CT perfusion 19 cc penumbra  Cerebral angio TICI 2b revascularization occluded R PCOM and P PCA. Underlying large vessel atherosclerosis   MRI Rt PCA infarct    CT repeat - 04/11/2019 - Evolving R PCA infarct. No progression. No hemorrhage  2D Echo EF 60  to 65%.   Continue tele monitoring, so far no afib found   HIV neg  LDL 101   HgbA1c 9.8  P2Y12 90  Lovenox 40 mg sq daily  for VTE prophylaxis  aspirin 81 mg daily and clopidogrel 75 mg daily prior to admission, put on aspirin 325 mg daily and clopidogrel 75 mg daily. However, developed itchiness, plavix was discontinued. Now on ASA 81 and Brilinta 90 bid. Continue on discharge.  Therapy recommendations:  SNF  Disposition:  Pending  Hx stroke/TIA  11/2018 - presented for dizziness and unsteady, MRI showed right frontal and parietal small white matter infarcts. No LVO. Extensive SVD.  Put on DAPT.  Patient signed out [facility]  01/2016 -  admitted for gait disability.  MRI negative for acute infarct.  Old infarcts B BG, thalami, L cerebellum.   Left upper and lower extremity involuntary movements-tremor versus ataxia  Likely due to right thalamic involvement of current stroke  Semi-involuntary during awake, absent during sleep  Not able to afford tetrabenazine  Put on abilify 5->10mg . However, pt developed vomiting episodes x 2 days. Improved after off abilify.  Hemichorea movement slowly improving  ?? PD with left hand tremor diagnosed in [place] - wife not sure if he was on any medication before   Cognitive decline  CT head unchanged; EEG no seizure  On seroquel QHs  Continue aricept 5mg  daily - increase to 10mg  in one month (05/16/2019) if tolerated  Hypertension  Home meds:  None listed  Stable 130-140s  Lotensin 5mg  daily stopped 8/8   If BP in the 150s, resume lower dose lotensin . BP goal normotensive  UTI, resolved  U/A WBC > 50  IV Rocephin 8/9 x 3 doses - completed  UCx w/ mult morphocytoes, recollection x 2 w/ multiple species  Hyperlipidemia  Home meds:  No statin listed  LDL 101, goal < 70  AST and ALT normalized   Continue Lipitor 40  Continue statin on discharge  Diabetes type II, uncontrolled->stable  Home meds:  glucophage 1000 bid  HgbA1c 9.8, goal < 7.0  On metformin 1000mg  bid from 7/11  CBGs  SSI  Dysphagia, resolved . 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 After hours, contact General Neurology

## 2019-04-27 LAB — GLUCOSE, CAPILLARY
Glucose-Capillary: 92 mg/dL (ref 70–99)
Glucose-Capillary: 117 mg/dL — ABNORMAL HIGH (ref 70–99)
Glucose-Capillary: 168 mg/dL — ABNORMAL HIGH (ref 70–99)
Glucose-Capillary: 89 mg/dL (ref 70–99)

## 2019-04-27 NOTE — Progress Notes (Addendum)
STROKE TEAM PROGRESS NOTE   Interval history:  No family is at bedside. Pt lying in bed, awake alert, following simple commands, still not fully orientated and paucity of speech, able to repeat but not naming. Moving all extremities well.   Vitals:   04/26/19 1649 04/26/19 2000 04/26/19 2320 04/27/19 0435  BP: (!) 161/77 131/86 139/84 134/80  Pulse: 78 78 86 87  Resp: 20 18 18 18   Temp: 98.6 F (37 C) 97.7 F (36.5 C) 98.5 F (36.9 C) 97.8 F (36.6 C)  TempSrc: Oral Oral Oral Oral  SpO2: 100% 100% 100% (!) 89%  Weight:      Height:        CBC:  Recent Labs  Lab 04/21/19 0957 04/25/19 0739  WBC 4.4 4.2  NEUTROABS  --  2.3  HGB 13.0 12.2*  HCT 39.0 36.6*  MCV 92.9 94.6  PLT 292 99991111    Basic Metabolic Panel:  Recent Labs  Lab 04/21/19 0957 04/25/19 0739  NA 141 141  K 3.6 3.8  CL 106 108  CO2 25 21*  GLUCOSE 109* 79  BUN 8 10  CREATININE 0.80 0.85  CALCIUM 9.5 9.3   IMAGING past 24h No results found.    PHYSICAL EXAM      General - Well nourished, well developed, not in acute distress  Ophthalmologic - fundi not visualized due to noncooperation.  Cardiovascular - Regular rate and rhythm.  Mental Status -  Awake and alert, orientated to self, but not orientated to place, age or time. Language exam not able to speak in sentence, repeating words with perseveration and stuttering. Able to repeat but anomia. Psychomotor slowing.   Cranial Nerves II - XII - II - left dense homonymous hemianopia. III, IV, VI - Extraocular movements intact but right gaze preference with slight limitation of left lateral gaze V - Facial sensation intact bilaterally. VII - Facial movement intact bilaterally. VIII - Hearing & vestibular intact bilaterally. X - Palate elevates symmetrically. XI - Chin turning & shoulder shrug intact bilaterally. XII - Tongue protrusion intact.  Motor Strength - [name] was normal in right sided extremities and mild 4/5  weakness on the left.  Mild weakness of left grip. Bulk was normal and fasciculations were absent.    Mild action tremor of the left upper and lower extremity.  No definite choreiform movements noted Motor Tone - Muscle tone was assessed at the neck and appendages and was normal.  Reflexes - [name] were symmetrical in all extremities and he had no pathological reflexes.  Sensory - Light touch, temperature/pinprick were assessed and were symmetrical.   Coordination impaired left finger-to-nose and knee to heel coordination. Gait and Station - not tested   ASSESSMENT/PLAN Joe Perry is a 68 y.o. 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 Old R frontal infarct.    CTA head & neck - R P1 occlusion. Small BA w/ high grade atheromatous narrowing. L ICA siphon stenosis.  CT perfusion 19 cc penumbra  Cerebral angio TICI 2b revascularization occluded R PCOM and P PCA. Underlying large vessel atherosclerosis   MRI Rt PCA infarct    CT repeat - 04/11/2019 - Evolving R PCA infarct. No progression. No hemorrhage  2D Echo EF 60  to 65%.   Continue tele monitoring, so far no afib found   HIV neg  LDL 101   HgbA1c 9.8  P2Y12 90  Lovenox 40 mg sq daily  for VTE prophylaxis  aspirin 81 mg daily and clopidogrel 75 mg daily prior to admission, put on aspirin 325 mg daily and clopidogrel 75 mg daily. However, developed itchiness, plavix was discontinued. Now on ASA 81 and Brilinta 90 bid. Continue on discharge.  Therapy recommendations:  SNF  Disposition:  Pending  Hx stroke/TIA  11/2018 - presented for dizziness and unsteady, MRI showed right frontal and parietal small white matter infarcts. No LVO. Extensive SVD.  Put on DAPT.  Patient signed out [facility]  01/2016 -  admitted for gait disability.  MRI negative for acute infarct.  Old infarcts B BG, thalami, L cerebellum.   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 However, pt developed vomiting episodes x 2 days. Improved after off abilify.  Hemichorea movement slowly improving  ?? PD with left hand tremor diagnosed in [place] - wife not sure if he was on any medication before   Cognitive decline  CT head unchanged; EEG no seizure  On seroquel QHs  Continue aricept 5mg  daily - increase to 10mg  in one month (05/16/2019) if tolerated  Hypertension  Home meds:  None listed  Stable 130-140s  Lotensin 5mg  daily stopped 8/8   If BP in the 150s, resume lower dose lotensin . BP goal normotensive  UTI, resolved  U/A WBC > 50  IV Rocephin 8/9 x 3 doses - completed  UCx w/ mult morphocytoes, recollection x 2 w/ multiple species  Hyperlipidemia  Home meds:  No statin listed  LDL 101, goal < 70  AST and ALT normalized   Continue Lipitor 40  Continue statin on discharge  Diabetes type II, uncontrolled->stable  Home meds:  glucophage 1000 bid  HgbA1c 9.8, goal < 7.0  On metformin 1000mg  bid from 7/11  CBGs  SSI  Dysphagia, resolved . 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 After hours, contact General Neurology

## 2019-04-28 LAB — GLUCOSE, CAPILLARY
Glucose-Capillary: 81 mg/dL (ref 70–99)
Glucose-Capillary: 94 mg/dL (ref 70–99)
Glucose-Capillary: 138 mg/dL — ABNORMAL HIGH (ref 70–99)
Glucose-Capillary: 73 mg/dL (ref 70–99)

## 2019-04-28 NOTE — Progress Notes (Signed)
Physical Therapy Treatment Patient Details Name: Joe Perry MRN: [id number] DOB: 1950/10/14 Today's Date: 04/28/2019    History of Present Illness 68 yo male s/p  R PCOM and R PCA occlusion s/p emergent thrombectomy on 7/6. Pt admitteed with L sided weakness, R gaze perference, L homonymous hemianopsia. 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    PT Comments    Pt was able to walk into the hallway with two person assist and RW.  He is less responsive to cues today than in previous sessions and seems extra flat.  Automatic greetings are intact, but conversation throughout the session was minimal to none.  PT will continue to follow acutely for safe mobility progression  Follow Up Recommendations  SNF;Supervision/Assistance - 24 hour     Equipment Recommendations  Wheelchair (measurements PT);Wheelchair cushion (measurements PT);3in1 (PT)    Recommendations for Other Services   NA     Precautions / Restrictions Precautions Precautions: Fall Precaution Comments: L lateral lean, L hemianopsia, L inattention, impulsive    Mobility  Bed Mobility Overal bed mobility: Needs Assistance Bed Mobility: Supine to Sit Rolling: Mod assist   Supine to sit: Mod assist;HOB elevated Sit to supine: Supervision   General bed mobility comments: Mod assist to initiate movement of legs and support trunk to come to sitting EOB.  Pt able to return to supine with verbal and visual cues only (no physical assist needed).    Transfers Overall transfer level: Needs assistance Equipment used: Rolling walker (2 wheeled) Transfers: Sit to/from Stand Sit to Stand: Min assist;Mod assist         General transfer comment: Min to mod assist to support trunk to power up to stand.  support provide on his left as he has a tendancy to lean left and anteriorly.  Ambulation/Gait Ambulation/Gait assistance: Mod assist;+2 physical assistance Gait Distance (Feet): 100 Feet Assistive device: Rolling walker (2  wheeled) Gait Pattern/deviations: Step-through pattern;Scissoring;Staggering left;Drifts right/left;Narrow base of support;Decreased step length - left     General Gait Details: Pt was able to walk into the hallway with RW and two person mod assist to help correct for left lateral lean.  Pt walking too quickly to be safe and L foot starts to drag more with fatigue at the end of gait without compensatory mechanisims to adjust for the weakness (despite cues to pick up his left foot).         Modified Rankin (Stroke Patients Only) Modified Rankin (Stroke Patients Only) Pre-Morbid Rankin Score: No symptoms Modified Rankin: Moderately severe disability     Balance Overall balance assessment: Needs assistance Sitting-balance support: Feet supported;Bilateral upper extremity supported Sitting balance-[name]: Poor Sitting balance - Comments: nees up to min assist for sitting balance.  Cues to find midline with therapist as visual reference.  worked on reaching towards the ground to his socks and coming back up.  Postural control: Left lateral lean;Other (comment)(anterior in standing. ) Standing balance support: Bilateral upper extremity supported Standing balance-[name]: Poor Standing balance comment: needs RW and at least one person mod assist in standing.                              Cognition Arousal/Alertness: Awake/alert Behavior During Therapy: Flat affect Overall Cognitive Status: Impaired/Different from baseline Area of Impairment: Orientation;Attention;Memory;Problem solving;Awareness;Safety/judgement;Following commands                 Orientation Level: Disoriented to;Place;Time;Situation Current Attention  Level: Sustained Memory: Decreased recall of precautions;Decreased short-term memory Following Commands: Follows one step commands with increased time;Follows one step commands inconsistently Safety/Judgement: Decreased awareness of  safety;Decreased awareness of deficits Awareness: Intellectual Problem Solving: Slow processing;Difficulty sequencing;Decreased initiation;Requires verbal cues;Requires tactile cues General Comments: Pt able to attempt basic commands given increased time and multimodal cues, but not always consistant, especially on left side of his body.  He is quick to move to come to standing without awareness of his balance or visual deficits.       Exercises General Exercises - Lower Extremity Long Arc Quad: AROM;Both;10 reps Hip Flexion/Marching: AROM;Both;10 reps;Standing Mini-Sqauts: AROM;Both;5 reps        Pertinent Vitals/Pain Pain Assessment: Faces Faces Pain Scale: No hurt Pain Location: pt reports pain, but unable to report where, no external signs of pain during mobility.            PT Goals (current goals can now be found in the care plan section) Acute Rehab PT Goals Patient Stated Goal: did not state Progress towards PT goals: Progressing toward goals    Frequency    Min 3X/week      PT Plan Current plan remains appropriate       AM-PAC PT "6 Clicks" Mobility   Outcome Measure  Help needed turning from your back to your side while in a flat bed without using bedrails?: A Little Help needed moving from lying on your back to sitting on the side of a flat bed without using bedrails?: A Lot Help needed moving to and from a bed to a chair (including a wheelchair)?: A Lot Help needed standing up from a chair using your arms (e.g., wheelchair or bedside chair)?: A Lot Help needed to walk in hospital room?: A Lot Help needed climbing 3-5 steps with a railing? : Total 6 Click Score: 12    End of Session Equipment Utilized During Treatment: Gait belt Activity Tolerance: Patient tolerated treatment well Patient left: in bed;with call bell/phone within reach;with bed alarm set   PT Visit Diagnosis: Muscle weakness (generalized) (M62.81);Hemiplegia and hemiparesis;Other  abnormalities of gait and mobility (R26.89) Hemiplegia - Right/Left: Left Hemiplegia - dominant/non-dominant: Dominant Hemiplegia - caused by: Cerebral infarction     Time: [id number] PT Time Calculation (min) (ACUTE ONLY): 21 min  Charges:  $Gait Training: 8-22 mins                    Taft Worthing B. Isaiah Cianci, PT, DPT  Acute Rehabilitation [phone] pager [phone]) [phone] office  @ [facility]: [phone]

## 2019-04-28 NOTE — Progress Notes (Signed)
  Speech Language Pathology Treatment: Dysphagia  Patient Details Name: Joe Perry MRN: [id number] DOB: [birth year] Today's Date: 04/28/2019 Time: 1219-1227 SLP Time Calculation (min) (ACUTE ONLY): 8 min  Assessment / Plan / Recommendation Clinical Impression  Pt alert, repositioned to optimize safety with lunch tray, but continues to slide down in bed.  Pt assisted with Magic Cup from tray, which he enjoyed and consumed with adequate oral attention/control and passage.  Near the end of session, he began to hold all POs (purees and water) in his mouth, requiring mod verbal/tactile cues to swallow and ultimately requiring oral suctioning for removal.  Pt continues to exhibit intermittent oral holding, which appears to relate to his perseverative motor/speech responses.  Once he begins holding POs, he requires verbal and tactile prompts to cease his perseverative response pattern and begin to chew again. Continue dysphagia 1 diet for now.  SLP will follow for swallow/cognitive-communication.   HPI HPI: 68 year old male admitted 03/10/2019 with left weakness, unsteady gait, right gaze, left hemianopsia. PMH: CVA, HTN, DM. CT negative. MRI Ischemic infarct of the right posterior cerebral artery, territory, roughly corresponding to the ischemic volume. Recent reports (8/19) from nursing are that patient is more delayed with mastication, holds food in his mouth then eventually starts coughing. Repeat BSE was ordered.      SLP Plan  Continue with current plan of care       Recommendations  Diet recommendations: Dysphagia 1 (puree);Thin liquid Liquids provided via: Cup;Straw Medication Administration: Crushed with puree Supervision: Staff to assist with self feeding;Full supervision/cueing for compensatory strategies Compensations: Slow rate;Small sips/bites;Minimize environmental distractions;Lingual sweep for clearance of pocketing Postural Changes and/or Swallow Maneuvers: Seated upright 90  degrees;Upright 30-60 min after meal                Oral Care Recommendations: Oral care BID Follow up Recommendations: Skilled Nursing facility SLP Visit Diagnosis: Dysphagia, oropharyngeal phase (R13.12) Plan: Continue with current plan of care       GO                Juan Quam Joe Perry 04/28/2019, 12:30 PM  Joe Perry L. Tivis Ringer, [place] Office number [phone] Pager [phone]

## 2019-04-28 NOTE — Progress Notes (Signed)
STROKE TEAM PROGRESS NOTE   Interval history:  Pt lying in bed, initially sleeping but easily arousable. Still has cognitive impairment and neuro unchanged. Still pending for SNF.    Vitals:   04/28/19 0002 04/28/19 0312 04/28/19 0907 04/28/19 1131  BP: 103/73 132/83 128/82 125/87  Pulse: 84 86 83 85  Resp: 18 18 18 18   Temp: 98.9 F (37.2 C) (!) 97.5 F (36.4 C) 98.5 F (36.9 C) 98.7 F (37.1 C)  TempSrc: Oral Oral Oral Oral  SpO2: 99% 100% 100% 100%  Weight:      Height:        CBC:  Recent Labs  Lab 04/25/19 0739  WBC 4.2  NEUTROABS 2.3  HGB 12.2*  HCT 36.6*  MCV 94.6  PLT 99991111    Basic Metabolic Panel:  Recent Labs  Lab 04/25/19 0739  NA 141  K 3.8  CL 108  CO2 21*  GLUCOSE 79  BUN 10  CREATININE 0.85  CALCIUM 9.3   IMAGING past 24h No results found.    PHYSICAL EXAM       General - Well nourished, well developed, not in acute distress  Ophthalmologic - fundi not visualized due to noncooperation.  Cardiovascular - Regular rate and rhythm.  Mental Status -  Awake and alert, orientated to self, but not orientated to place, age or time. Language exam not able to speak in sentence, repeating words with perseveration and stuttering. Able to repeat but anomia. Psychomotor slowing.   Cranial Nerves II - XII - II - left dense homonymous hemianopia. III, IV, VI - Extraocular movements intact but right gaze preference with slight limitation of left lateral gaze V - Facial sensation intact bilaterally. VII - Facial movement intact bilaterally. VIII - Hearing & vestibular intact bilaterally. X - Palate elevates symmetrically. XI - Chin turning & shoulder shrug intact bilaterally. XII - Tongue protrusion intact.  Motor Strength - [name] was normal in right sided extremities and mild 4/5 weakness on the left.  Mild weakness of left grip. Bulk was normal and fasciculations were absent.    Mild action tremor of the left upper and lower  extremity.  No definite choreiform movements noted Motor Tone - Muscle tone was assessed at the neck and appendages and was normal.  Reflexes - [name] were symmetrical in all extremities and he had no pathological reflexes.  Sensory - Light touch, temperature/pinprick were assessed and were symmetrical.   Coordination impaired left finger-to-nose and knee to heel coordination. Gait and Station - not tested   ASSESSMENT/PLAN [name] is a 68 y.o. 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 Old R frontal infarct.    CTA head & neck - R P1 occlusion. Small BA w/ high grade atheromatous narrowing. L ICA siphon stenosis.  CT perfusion 19 cc penumbra  Cerebral angio TICI 2b revascularization occluded R PCOM and P PCA. Underlying large vessel atherosclerosis   MRI Rt PCA infarct    CT repeat - 04/11/2019 - Evolving R PCA infarct. No progression. No hemorrhage  2D Echo EF 60 to 65%.   Continue tele monitoring, so far no afib found   HIV neg  LDL 101   HgbA1c 9.8  P2Y12 90  Lovenox 40 mg sq  daily  for VTE prophylaxis  aspirin 81 mg daily and clopidogrel 75 mg daily prior to admission, put on aspirin 325 mg daily and clopidogrel 75 mg daily. However, developed itchiness, plavix was discontinued. Now on ASA 81 and Brilinta 90 bid. Continue on discharge.  Therapy recommendations:  SNF  Disposition:  Pending  Hx stroke/TIA  11/2018 - presented for dizziness and unsteady, MRI showed right frontal and parietal small white matter infarcts. No LVO. Extensive SVD.  Put on DAPT.  Patient signed out [facility]  01/2016 - admitted for gait disability.  MRI negative for acute infarct.  Old infarcts B BG, thalami, L cerebellum.   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 However, pt developed vomiting episodes x 2 days. Improved after off abilify.  Hemichorea movement slowly improving  ?? PD with left hand tremor diagnosed in [place] - wife not sure if he was on any medication before   Cognitive decline  CT head unchanged; EEG no seizure  On seroquel QHs  Continue aricept 5mg  daily - increase to 10mg  in one month (05/16/2019) if tolerated  Hypertension  Home meds:  None listed  Stable 130-140s  Lotensin 5mg  daily stopped 8/8   If BP in the 150s, resume lower dose lotensin . BP goal normotensive  UTI, resolved  U/A WBC > 50  IV Rocephin 8/9 x 3 doses - completed  UCx w/ mult morphocytoes, recollection x 2 w/ multiple species  Hyperlipidemia  Home meds:  No statin listed  LDL 101, goal < 70  AST and ALT normalized   Continue Lipitor 40  Continue statin on discharge  Diabetes type II, uncontrolled->stable  Home meds:  glucophage 1000 bid  HgbA1c 9.8, goal < 7.0  On metformin 1000mg  bid from 7/11  CBGs  SSI  Dysphagia, resolved . 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 After hours, contact General Neurology

## 2019-04-29 LAB — GLUCOSE, CAPILLARY
Glucose-Capillary: 80 mg/dL (ref 70–99)
Glucose-Capillary: 147 mg/dL — ABNORMAL HIGH (ref 70–99)
Glucose-Capillary: 102 mg/dL — ABNORMAL HIGH (ref 70–99)
Glucose-Capillary: 125 mg/dL — ABNORMAL HIGH (ref 70–99)

## 2019-04-29 NOTE — Progress Notes (Signed)
  Speech Language Pathology Treatment: Dysphagia;Cognitive-Linquistic  Patient Details Name: Joe Perry MRN: [id number] DOB: 22-Nov-1950 Today's Date: 04/29/2019 Time: 1025-1050 SLP Time Calculation (min) (ACUTE ONLY): 25 min  Assessment / Plan / Recommendation Clinical Impression  Patient was seen for dysphagia and aphasia treatment. He followed basic verbal commands to move and reposition himself in bed, but when SLP requested he bend knees to push up further in bed, he locked his knees and would not move them. He did not verbally respond to any questions and did not make any attempts to communicate with SLP (verbally or non-verbally). He was able to manage food and liquids when only using one hand, but he had significant difficulty with managing both hands together (ie, holding bowl and using other hand to spoon feed fruit cup) secondary to apraxia and required hand-over-hand assist to perform self-feeding in this manner. No overt s/s of aspiraiton or penetration were observed with liquids, but he continues to exhibit prolonged bolus formation, prolonged mastication, holding of liquids and solids with apparent loss of attention, awarenes to bolus in mouth. With setup assistance, he was able to brush his teeth independently and did perform completely and without observed perseveration on one side of oral cavity. Overall, patient has declined in cognitive-linguistic and swallow function.    HPI HPI: 68 year old male admitted 03/10/2019 with left weakness, unsteady gait, right gaze, left hemianopsia. PMH: CVA, HTN, DM. CT negative. MRI Ischemic infarct of the right posterior cerebral artery, territory, roughly corresponding to the ischemic volume. Recent reports (8/19) from nursing are that patient is more delayed with mastication, holds food in his mouth then eventually starts coughing. Repeat BSE was ordered.      SLP Plan          Recommendations  Diet recommendations: Thin  liquid;Dysphagia 1 (puree) Liquids provided via: Cup;Straw Medication Administration: Crushed with puree Supervision: Staff to assist with self feeding;Full supervision/cueing for compensatory strategies Compensations: Slow rate;Small sips/bites;Minimize environmental distractions;Lingual sweep for clearance of pocketing Postural Changes and/or Swallow Maneuvers: Seated upright 90 degrees                        GO                [name] 04/29/2019, 5:13 PM  [name], MA, CCC-SLP Speech Therapy [facility] Acute Rehab Pager: [phone]

## 2019-04-29 NOTE — Progress Notes (Signed)
STROKE TEAM PROGRESS NOTE   Interval history:  Pt awake alert, lying in bed, still has left hemichorea but amplitude decreased. Still has cognitive impairment with stuttering speech with perseveration.   Vitals:   04/29/19 0007 04/29/19 0430 04/29/19 0812 04/29/19 1114  BP: (!) 157/97 121/84 113/75 112/80  Pulse: (!) 112 95 87 73  Resp: 18 18 16 15   Temp: 99 F (37.2 C) 99.5 F (37.5 C) 98.6 F (37 C) 98.5 F (36.9 C)  TempSrc: Oral Oral Axillary Axillary  SpO2: 100% 100% 97% 99%  Weight:      Height:        CBC:  Recent Labs  Lab 04/25/19 0739  WBC 4.2  NEUTROABS 2.3  HGB 12.2*  HCT 36.6*  MCV 94.6  PLT 99991111    Basic Metabolic Panel:  Recent Labs  Lab 04/25/19 0739  NA 141  K 3.8  CL 108  CO2 21*  GLUCOSE 79  BUN 10  CREATININE 0.85  CALCIUM 9.3   IMAGING past 24h No results found.    PHYSICAL EXAM   General - Well nourished, well developed, not in acute distress  Ophthalmologic - fundi not visualized due to noncooperation.  Cardiovascular - Regular rate and rhythm.  Mental Status -  Awake and alert, orientated to self, but not orientated to place, age or time. Language exam not able to speak in sentence, repeating words with perseveration and stuttering. Able to repeat but anomia. Psychomotor slowing.   Cranial Nerves II - XII - II - left dense homonymous hemianopia. III, IV, VI - Extraocular movements intact but right gaze preference with slight limitation of left lateral gaze V - Facial sensation intact bilaterally. VII - Facial movement intact bilaterally. VIII - Hearing & vestibular intact bilaterally. X - Palate elevates symmetrically. XI - Chin turning & shoulder shrug intact bilaterally. XII - Tongue protrusion intact.  Motor Strength - [name] was normal in right sided extremities and mild 4/5 weakness on the left.  Mild weakness of left grip. Bulk was normal and fasciculations were absent.    Mild action tremor of  the left upper and lower extremity.  No definite choreiform movements noted Motor Tone - Muscle tone was assessed at the neck and appendages and was normal.  Reflexes - [name] were symmetrical in all extremities and he had no pathological reflexes.  Sensory - Light touch, temperature/pinprick were assessed and were symmetrical.   Coordination impaired left finger-to-nose and knee to heel coordination. Gait and Station - not tested   ASSESSMENT/PLAN [name] is a 68 y.o. 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 Old R frontal infarct.    CTA head & neck - R P1 occlusion. Small BA w/ high grade atheromatous narrowing. L ICA siphon stenosis.  CT perfusion 19 cc penumbra  Cerebral angio TICI 2b revascularization occluded R PCOM and P PCA. Underlying large vessel atherosclerosis   MRI Rt PCA infarct    CT repeat - 04/11/2019 - Evolving R PCA infarct. No progression. No hemorrhage  2D Echo EF 60 to 65%.   Continue tele monitoring, so far no afib found   HIV neg  LDL 101   HgbA1c 9.8  P2Y12 90  Lovenox 40 mg sq daily  for VTE prophylaxis  aspirin 81 mg daily and clopidogrel 75 mg daily prior to admission, put on aspirin 325 mg daily and clopidogrel 75 mg daily. However, developed itchiness, plavix was discontinued. Now on ASA 81 and Brilinta 90 bid. Continue on discharge.  Therapy recommendations:  SNF  Disposition:  Pending  Hx stroke/TIA  11/2018 - presented for dizziness and unsteady, MRI showed right frontal and parietal small white matter infarcts. No LVO. Extensive SVD.  Put on DAPT.  Patient signed out [facility]  01/2016 - admitted for gait disability.  MRI negative for acute infarct.  Old infarcts B BG, thalami, L cerebellum.   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 However, pt developed vomiting episodes x 2 days. Improved after off abilify.  Hemichorea movement slowly improving  ?? PD with left hand tremor diagnosed in [place] - wife not sure if he was on any medication before   Cognitive decline  CT head unchanged; EEG no seizure  On seroquel QHs  Continue aricept 5mg  daily - increase to 10mg  in one month (05/16/2019) if tolerated  Hypertension  Home meds:  None listed  Stable 130-140s  Lotensin 5mg  daily stopped 8/8   If BP in the 150s, resume lower dose lotensin . BP goal normotensive  UTI, resolved  U/A WBC > 50  IV Rocephin 8/9 x 3 doses - completed  UCx w/ mult morphocytoes, recollection x 2 w/ multiple species  Hyperlipidemia  Home meds:  No statin listed  LDL 101, goal < 70  AST and ALT normalized   Continue Lipitor 40  Continue statin on discharge  Diabetes type II, uncontrolled->stable  Home meds:  glucophage 1000 bid  HgbA1c 9.8, goal < 7.0  On metformin 1000mg  bid from 7/11  CBGs  SSI  Dysphagia, resolved . 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 After hours, contact General Neurology

## 2019-04-30 DIAGNOSIS — F0151 Vascular dementia with behavioral disturbance: Secondary | ICD-10-CM

## 2019-04-30 LAB — CBC
HCT: 37.5 % — ABNORMAL LOW (ref 39.0–52.0)
Hemoglobin: 12.2 g/dL — ABNORMAL LOW (ref 13.0–17.0)
MCH: 30.9 pg (ref 26.0–34.0)
MCHC: 32.5 g/dL (ref 30.0–36.0)
MCV: 94.9 fL (ref 80.0–100.0)
Platelets: 264 10*3/uL (ref 150–400)
RBC: 3.95 MIL/uL — ABNORMAL LOW (ref 4.22–5.81)
RDW: 13.2 % (ref 11.5–15.5)
WBC: 4.4 10*3/uL (ref 4.0–10.5)
nRBC: 0 % (ref 0.0–0.2)

## 2019-04-30 LAB — GLUCOSE, CAPILLARY
Glucose-Capillary: 83 mg/dL (ref 70–99)
Glucose-Capillary: 110 mg/dL — ABNORMAL HIGH (ref 70–99)
Glucose-Capillary: 132 mg/dL — ABNORMAL HIGH (ref 70–99)
Glucose-Capillary: 87 mg/dL (ref 70–99)
Glucose-Capillary: 112 mg/dL — ABNORMAL HIGH (ref 70–99)

## 2019-04-30 LAB — BASIC METABOLIC PANEL
Anion gap: 10 (ref 5–15)
BUN: 14 mg/dL (ref 8–23)
CO2: 26 mmol/L (ref 22–32)
Calcium: 9.6 mg/dL (ref 8.9–10.3)
Chloride: 105 mmol/L (ref 98–111)
Creatinine, Ser: 0.96 mg/dL (ref 0.61–1.24)
GFR calc Af Amer: >60 mL/min (ref >60)
GFR calc non Af Amer: >60 mL/min (ref >60)
Glucose, Bld: 90 mg/dL (ref 70–99)
Potassium: 3.3 mmol/L — ABNORMAL LOW (ref 3.5–5.1)
Sodium: 141 mmol/L (ref 135–145)

## 2019-04-30 MED ORDER — QUETIAPINE FUMARATE 25 MG PO TABS
25.0000 mg | ORAL_TABLET | Freq: Every day | ORAL | Status: DC
  Administered 2019-04-30: 21:00:00 25 mg via ORAL
  Filled 2019-04-30: qty 1

## 2019-04-30 MED ORDER — POTASSIUM CHLORIDE CRYS ER 20 MEQ PO TBCR
40.0000 meq | EXTENDED_RELEASE_TABLET | ORAL | Status: AC
  Administered 2019-04-30 (×2): 40 meq via ORAL
  Filled 2019-04-30 (×2): qty 2

## 2019-04-30 NOTE — Progress Notes (Signed)
Tele sitter in place, floor mats in place, extra padding on bed rails in place

## 2019-04-30 NOTE — Progress Notes (Signed)
STROKE TEAM PROGRESS NOTE   Interval history:  Pt lying in bed, awake alert but confused, playing with mats. When asking what he was doing, he stated "I am fixing the machine". When ask "what machine", he answered "new machine". He was able to tell me his name, "Joe Perry" but not able to tell his age or today's month or year.   Vitals:   04/29/19 2050 04/30/19 0518 04/30/19 0904 04/30/19 1151  BP: (!) 159/97 (!) 142/99 133/75 123/78  Pulse: 97 82 85 83  Resp: 20 20 20 20   Temp: (!) 97.4 F (36.3 C) 97.7 F (36.5 C) 98.4 F (36.9 C) 97.8 F (36.6 C)  TempSrc: Oral Oral Oral Axillary  SpO2: 96% 100% 100% 99%  Weight:      Height:        CBC:  Recent Labs  Lab 04/25/19 0739 04/30/19 0721  WBC 4.2 4.4  NEUTROABS 2.3  --   HGB 12.2* 12.2*  HCT 36.6* 37.5*  MCV 94.6 94.9  PLT 262 [id number]    Basic Metabolic Panel:  Recent Labs  Lab 04/25/19 0739 04/30/19 0721  NA 141 141  K 3.8 3.3*  CL 108 105  CO2 21* 26  GLUCOSE 79 90  BUN 10 14  CREATININE 0.85 0.96  CALCIUM 9.3 9.6   IMAGING past 24h No results found.    PHYSICAL EXAM    General - Well nourished, well developed, mildly restless, fidget   Ophthalmologic - fundi not visualized due to noncooperation.  Cardiovascular - Regular rate and rhythm.  Mental Status -  Awake and alert, orientated to self and place, but not orientated to age or time. Language exam not able to speak in sentence, repeating words with perseveration and stuttering. Able to repeat but anomia. Psychomotor slowing.   Cranial Nerves II - XII - II - left dense homonymous hemianopia. III, IV, VI - Extraocular movements intact but right gaze preference with slight limitation of left lateral gaze V - Facial sensation intact bilaterally. VII - Facial movement intact bilaterally. VIII - Hearing & vestibular intact bilaterally. X - Palate elevates symmetrically. XI - Chin turning & shoulder shrug intact bilaterally. XII - Tongue protrusion  intact.  Motor Strength - [name] was normal in right sided extremities and mild 4/5 weakness on the left.  Mild weakness of left grip. Bulk was normal and fasciculations were absent.    Mild action tremor of the left upper and lower extremity.  No definite choreiform movements noted Motor Tone - Muscle tone was assessed at the neck and appendages and was normal.  Reflexes - [name] were symmetrical in all extremities and he had no pathological reflexes.  Sensory - Light touch, temperature/pinprick were assessed and were symmetrical.   Coordination impaired left finger-to-nose and knee to heel coordination. Gait and Station - not tested   ASSESSMENT/PLAN Joe Perry is a 68 y.o. 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 Old R frontal infarct.    CTA head & neck - R P1 occlusion. Small BA w/ high grade atheromatous narrowing. L ICA siphon stenosis.  CT perfusion 19 cc penumbra  Cerebral angio TICI 2b revascularization occluded R PCOM and P PCA. Underlying large vessel atherosclerosis   MRI Rt PCA infarct  CT repeat - 04/11/2019 - Evolving R PCA infarct. No progression. No hemorrhage  Repeat MRI w/w/o contrast   2D Echo EF 60 to 65%.   Continue tele monitoring, so far no afib found   HIV neg  LDL 101   HgbA1c 9.8  P2Y12 90  Lovenox 40 mg sq daily  for VTE prophylaxis  aspirin 81 mg daily and clopidogrel 75 mg daily prior to admission, put on aspirin 325 mg daily and clopidogrel 75 mg daily. However, developed itchiness, plavix was discontinued. Now on ASA 81 and Brilinta 90 bid. Continue on discharge.  Therapy recommendations:  SNF  Disposition:  Pending  Hx stroke/TIA  11/2018 - presented for dizziness and unsteady, MRI showed  right frontal and parietal small white matter infarcts. No LVO. Extensive SVD.  Put on DAPT.  Patient signed out Joe Perry  01/2016 - admitted for gait disability.  MRI negative for acute infarct.  Old infarcts B BG, thalami, L cerebellum.   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 However, pt developed vomiting episodes x 2 days. Improved after off abilify.  Hemichorea movement slowly improving  ?? PD with left hand tremor diagnosed in [place] - wife not sure if he was on any medication before   Cognitive decline  CT head unchanged; EEG no seizure  Intermittent restless  Did not sleep last night as per RN  Increase seroquel to 25 QHs  Continue aricept 5mg  daily - increase to 10mg  in one month (05/16/2019) if tolerated  Hypertension  Home meds:  None listed  Stable 130-140s  Lotensin 5mg  daily stopped 8/8   If BP in the 150s, resume lower dose lotensin . BP goal normotensive  UTI, resolved  U/A WBC > 50  IV Rocephin 8/9 x 3 doses - completed  UCx w/ mult morphocytoes, recollection x 2 w/ multiple species  Hyperlipidemia  Home meds:  No statin listed  LDL 101, goal < 70  AST and ALT normalized   Continue Lipitor 40  Continue statin on discharge  Diabetes type II, uncontrolled->stable  Home meds:  glucophage 1000 bid  HgbA1c 9.8, goal < 7.0  On metformin 1000mg  bid from 7/11  CBGs  SSI  Dysphagia, resolved . On D1 thin liquids, meds in puree  Other Stroke Risk Factors  Advanced age  Overweight, Body mass index is 29.3 kg/m., recommend weight loss, diet and exercise as appropriate   No driving due to hemianopia, discussed with wife in the past  Other Active Problems  Constipation, abd xray ok, put on senokot, prn bisacodyl and fleets  N/V - resolved  Chronic stuttering - as per  wife  Hypokalemia 3.3 - supplement   Hospital day # 52   Joe Hawking, MD PhD Stroke Neurology 04/30/2019 2:56 PM   To contact Stroke Continuity provider, please refer to [email or web address] After hours, contact General Neurology

## 2019-04-30 NOTE — Progress Notes (Signed)
Occupational Therapy Progress Note (late entry)  Pt seen for self feeding EOB.  He is more alert and interactive today, but interactions are marked by perseveration and difficulty following commands.  He required total A for self feeding and demonstrated significant difficulty initiating a swallow.  Required hand over hand assist to for utensil manipulation.    Pt with variable performance and progress.  He has not met goals, and they were extended and downgraded.  Continue to recommend SNF.    04/29/19 1400  OT Visit Information  Last OT Received On 04/29/19  Assistance Needed +2  History of Present Illness 68 yo male s/p  R PCOM and R PCA occlusion s/p emergent thrombectomy on 7/6. Pt admitteed with L sided weakness, R gaze perference, L homonymous hemianopsia. 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  Precautions  Precautions Fall  Precaution Comments L lateral lean, L hemianopsia, L inattention, impulsive  Pain Assessment  Pain Assessment Faces  Faces Pain Scale 0  Cognition  Arousal/Alertness Awake/alert  Behavior During Therapy Flat affect  Overall Cognitive Status Difficult to assess  Area of Impairment Orientation;Attention;Memory;Following commands;Safety/judgement;Awareness;Problem solving  Current Attention Level Sustained;Focused  Following Commands Follows one step commands inconsistently;Follows one step commands with increased time  Safety/Judgement Decreased awareness of safety  Problem Solving Slow processing;Decreased initiation;Difficulty sequencing;Requires verbal cues;Requires tactile cues  General Comments cognition difficult to fully assess due to communication deficits, perseveration, deficits with initiation,and possible apraxia.  He perseverates on his name, but when given choice, he is able to indiccate he is in the hospital.  He is highly distractable,   Difficult to assess due to Impaired communication  Upper Extremity Assessment  Upper Extremity Assessment  Generalized weakness;LUE deficits/detail  RUE Deficits / Details AROM appears WFL.  He, however, perseverated on scooping syrup and food onto spoon, and was unable to extinguish behavior   LUE Deficits / Details chorea present, but he is able to engage Lt UE as an active assist during ADLs   LUE Coordination decreased fine motor;decreased gross motor  Lower Extremity Assessment  Lower Extremity Assessment Defer to PT evaluation  ADL  Overall ADL's  Needs assistance/impaired  Eating/Feeding Total assistance;Sitting  Eating/Feeding Details (indicate cue type and reason) attempted to engage pt in self feeding while EOB. Pt able to take a drink of tea, once container handed to him.  He proceeded to "chew' the tea, demonstrating perseverative behaviors.  He was unable to initiate a swallow despite max cues.  He eventually coughed, spitting tea out.  He was able to take 3 bites of food with hand over hand assist, and was only able to initiate a swallow if OT attempted to engage him in conversation, where he repeated his name multiple times.  Pt requires extensive amount of time to complete   Bed Mobility  Overal bed mobility Needs Assistance  Bed Mobility Supine to Sit;Sit to Supine  Supine to sit Min assist  Sit to supine Min assist  General bed mobility comments assist to guide LEs off of and onto the bed   Balance  Overall balance assessment Needs assistance  Sitting-balance support Feet supported  Sitting balance-[name] Fair  Sitting balance - Comments able to maintain EOB sitting with min guard assist   Vision- Assessment  Visual Fields Left visual field deficit  General Comments  General comments (skin integrity, edema, etc.) RN informed of pt performance   OT - End of Session  Activity Tolerance Patient tolerated treatment well  Patient  left in bed;with call bell/phone within reach;with bed alarm set  Nurse Communication Other (comment) (performance update)  OT Assessment/Plan   OT Plan Discharge plan remains appropriate  OT Visit Diagnosis Cognitive communication deficit (R41.841)  Symptoms and signs involving cognitive functions Cerebral infarction  OT Frequency (ACUTE ONLY) Min 2X/week  Follow Up Recommendations SNF;Supervision/Assistance - 24 hour  OT Equipment None recommended by OT  AM-PAC OT "6 Clicks" Daily Activity Outcome Measure (Version 2)  Help from another person eating meals? 1  Help from another person taking care of personal grooming? 2  Help from another person toileting, which includes using toliet, bedpan, or urinal? 2  Help from another person bathing (including washing, rinsing, drying)? 2  Help from another person to put on and taking off regular upper body clothing? 2  Help from another person to put on and taking off regular lower body clothing? 2  6 Click Score 11  OT Goal Progression  Progress towards OT goals Not progressing toward goals - comment;Goals drowngraded-see care plan (goals continued due to inconsistent performance )  Acute Rehab OT Goals  Patient Stated Goal did not state  OT Goal Formulation Patient unable to participate in goal setting  Time For Goal Achievement 05/14/19  Potential to Achieve Goals Fair  ADL Goals  Pt Will Perform Grooming with mod assist;sitting;standing  Pt Will Perform Lower Body Dressing sit to/from stand;with mod assist  Pt Will Transfer to Toilet with mod assist;squat pivot transfer;bedside commode  Pt Will Perform Toileting - Clothing Manipulation and hygiene sit to/from stand;with mod assist  Additional ADL Goal #1 Pt will follow one step commands consistently during ADLs  Pt Will Perform Eating with mod assist;sitting  OT Time Calculation  OT Start Time (ACUTE ONLY) 1156  OT Stop Time (ACUTE ONLY) 1221  OT Time Calculation (min) 25 min  OT General Charges  $OT Visit 1 Visit  OT Treatments  $Self Care/Home Management  23-37 mins  Lucille Passy, OTR/L Acute Rehabilitation  Services Pager [phone] Office [phone]

## 2019-04-30 NOTE — Progress Notes (Signed)
Physical Therapy Treatment Patient Details Name: Joe Perry MRN: [id number] DOB: 1950/11/29 Today's Date: 04/30/2019    History of Present Illness 68 yo male s/p  R PCOM and R PCA occlusion s/p emergent thrombectomy on 7/6. Pt admitteed with L sided weakness, R gaze perference, L homonymous hemianopsia. 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    PT Comments    Pt continues to require two person assist for hallway gait due to left lateral lean and decreased awareness of LOB and no balance reactions.  He was less restless after walking today.  PT will continue to follow acutely for safe mobility progression  Follow Up Recommendations  SNF;Supervision/Assistance - 24 hour     Equipment Recommendations  Rolling walker with 5" wheels;3in1 (PT);Wheelchair (measurements PT);Wheelchair cushion (measurements PT)    Recommendations for Other Services   NA     Precautions / Restrictions Precautions Precautions: Fall Precaution Comments: L lateral lean, L hemianopsia, L inattention, impulsive    Mobility  Bed Mobility Overal bed mobility: Needs Assistance Bed Mobility: Supine to Sit;Sit to Supine     Supine to sit: Min assist Sit to supine: Supervision   General bed mobility comments: Min hand held assist to help pt pull up to sitting EOB.  Supervision to return to supine with cues to intiate move towards pillow.   Transfers Overall transfer level: Needs assistance Equipment used: Rolling walker (2 wheeled) Transfers: Sit to/from Stand Sit to Stand: Min assist         General transfer comment: Min assist to help pt power up to stand, stabilize RW and get his feet under his body.   Ambulation/Gait Ambulation/Gait assistance: Mod assist;+2 physical assistance Gait Distance (Feet): 100 Feet Assistive device: Rolling walker (2 wheeled) Gait Pattern/deviations: Step-through pattern;Scissoring;Staggering left;Drifts right/left;Narrow base of support;Decreased step length -  left Gait velocity: decreased Gait velocity interpretation: <1.8 ft/sec, indicate of risk for recurrent falls General Gait Details: Pt continues to require two person assist to be safe ambulating in the hallway with RW as when he loses his balance he has no balance reactions.  Mod assist to recover, stabilize pt for balance and stabilize RW from tipping.  Cues to pick up his left leg towards the end of gait as he drags it.  Cues to pay attention to walking in busy, distracting hallway.        Modified Rankin (Stroke Patients Only) Modified Rankin (Stroke Patients Only) Pre-Morbid Rankin Score: No symptoms Modified Rankin: Moderately severe disability     Balance Overall balance assessment: Needs assistance Sitting-balance support: Feet supported;No upper extremity supported Sitting balance-[name]: Good     Standing balance support: Bilateral upper extremity supported Standing balance-[name]: Poor Standing balance comment: needs RW and at least one person mod assist in standing.                              Cognition Arousal/Alertness: Awake/alert Behavior During Therapy: Flat affect Overall Cognitive Status: Impaired/Different from baseline Area of Impairment: Attention;Memory;Safety/judgement;Awareness;Following commands;Problem solving                 Orientation Level: Situation Current Attention Level: Sustained Memory: Decreased recall of precautions;Decreased short-term memory Following Commands: Follows one step commands with increased time Safety/Judgement: Decreased awareness of safety Awareness: Intellectual Problem Solving: Slow processing;Decreased initiation;Difficulty sequencing;Requires verbal cues;Requires tactile cues General Comments: Pt restless in the bed, but better able to follow basic commands 75% of  time given extra time and structure today.  He seemed happy to get up as he was moving all over the bed prior to working with me.        Exercises General Exercises - Lower Extremity Long Arc Quad: AROM;Both;10 reps Hip Flexion/Marching: AROM;Both;10 reps Mini-Sqauts: AROM;Both;10 reps        Pertinent Vitals/Pain Pain Assessment: No/denies pain           PT Goals (current goals can now be found in the care plan section) Acute Rehab PT Goals Patient Stated Goal: did not state Progress towards PT goals: Progressing toward goals    Frequency    Min 3X/week      PT Plan Current plan remains appropriate       AM-PAC PT "6 Clicks" Mobility   Outcome Measure  Help needed turning from your back to your side while in a flat bed without using bedrails?: A Little Help needed moving from lying on your back to sitting on the side of a flat bed without using bedrails?: A Little Help needed moving to and from a bed to a chair (including a wheelchair)?: A Little Help needed standing up from a chair using your arms (e.g., wheelchair or bedside chair)?: A Little Help needed to walk in hospital room?: A Lot Help needed climbing 3-5 steps with a railing? : A Lot 6 Click Score: 16    End of Session Equipment Utilized During Treatment: Gait belt Activity Tolerance: Patient tolerated treatment well Patient left: in bed;with call bell/phone within reach;with bed alarm set Nurse Communication: Mobility status PT Visit Diagnosis: Muscle weakness (generalized) (M62.81);Hemiplegia and hemiparesis;Other abnormalities of gait and mobility (R26.89) Hemiplegia - Right/Left: Left Hemiplegia - dominant/non-dominant: Dominant Hemiplegia - caused by: Cerebral infarction     Time: 1701-1734 PT Time Calculation (min) (ACUTE ONLY): 33 min  Charges:  $Gait Training: 8-22 mins $Therapeutic Activity: 8-22 mins                    Joe Perry, PT, DPT  Acute Rehabilitation [phone] pager [phone] office  @ Joe Perry: [phone]   04/30/2019, 6:54 PM

## 2019-05-01 LAB — GLUCOSE, CAPILLARY
Glucose-Capillary: 74 mg/dL (ref 70–99)
Glucose-Capillary: 121 mg/dL — ABNORMAL HIGH (ref 70–99)
Glucose-Capillary: 92 mg/dL (ref 70–99)
Glucose-Capillary: 112 mg/dL — ABNORMAL HIGH (ref 70–99)

## 2019-05-01 MED ORDER — QUETIAPINE FUMARATE 50 MG PO TABS
50.0000 mg | ORAL_TABLET | Freq: Every day | ORAL | Status: DC
  Administered 2019-05-01 – 2019-05-14 (×14): 50 mg via ORAL
  Filled 2019-05-01 (×14): qty 1

## 2019-05-01 MED ORDER — QUETIAPINE FUMARATE 25 MG PO TABS
25.0000 mg | ORAL_TABLET | Freq: Every morning | ORAL | Status: DC
  Administered 2019-05-02 – 2019-05-15 (×14): 25 mg via ORAL
  Filled 2019-05-01 (×14): qty 1

## 2019-05-01 MED ORDER — QUETIAPINE FUMARATE 25 MG PO TABS: 25.0000 mg | ORAL_TABLET | Freq: Two times a day (BID) | ORAL | Status: DC

## 2019-05-01 NOTE — Progress Notes (Signed)
SLP Cancellation Note  Patient Details Name: Joe Perry MRN: [id number] DOB: 1951/04/12   Cancelled treatment:       Reason Eval/Treat Not Completed: Patient at procedure or test/unavailable(Pt working with PT and OT at this time. SLP will re-attempt treatment as able.)  Laree Garron I. Hardin Negus, [place], [place] Office number [phone] Pager [place] 05/01/2019, 11:40 AM

## 2019-05-01 NOTE — Progress Notes (Signed)
  Speech Language Pathology Treatment: Cognitive-Linquistic  Patient Details Name: Joe Perry MRN: [id number] DOB: February 25, 1951 Today's Date: 05/01/2019 Time: [id number] SLP Time Calculation (min) (ACUTE ONLY): 24 min  Assessment / Plan / Recommendation Clinical Impression  Pt was seen for language treatment and was cooperative throughout the session. His participation, alertness, and verbal output were all notably improved compared to that which was demonstrated when he was last seen by this SLP on 04/23/19. Perseveration of phrases was noted during the session and he was more responsive to prompting. He demonstrated 0% accuracy with confrontational naming of pictures and exhibited perseveration on, "I saw a picture". However, he demonstrated 100% accuracy with naming of objects when mod cues were given. He achieved 100% accuracy with phrase completion and 60% accuracy with sentence completion increasing to 80% with phonemic cues. He followed 1-step commands with 100% accuracy but was unable to follow any 2-step commands despite cues. Pt was oriented to person and place but was unable accurately answer time orientation questions despite verbal and visual prompts. SLP will continue to follow pt.    HPI HPI: 68 year old male admitted 03/10/2019 with left weakness, unsteady gait, right gaze, left hemianopsia. PMH: CVA, HTN, DM. CT negative. MRI Ischemic infarct of the right posterior cerebral artery, territory, roughly corresponding to the ischemic volume.      SLP Plan  Continue with current plan of care       Recommendations                   Oral Care Recommendations: Oral care BID Follow up Recommendations: Skilled Nursing facility SLP Visit Diagnosis: Cognitive communication deficit (R41.841);Aphasia (R47.01);Dysphagia, unspecified (R13.10) Plan: Continue with current plan of care       Linnaea Ahn I. Hardin Negus, [place], [place] Office number  [phone] Pager [place] 05/01/2019, 1:45 PM

## 2019-05-01 NOTE — Progress Notes (Signed)
STROKE TEAM PROGRESS NOTE   Interval history:  Wife at the bedside.  As per RN, patient did not get much sleep last night, also did not sleep much this morning.  MRI pending, not able to complete due to restlessness.  Will increase Seroquel dose.  Vitals:   05/01/19 0920 05/01/19 0921 05/01/19 0940 05/01/19 1240  BP: 105/61 105/61 117/78 121/68  Pulse: (!) 56 (!) 57 96 61  Resp: 15 15 15 15   Temp: 97.7 F (36.5 C) 97.7 F (36.5 C) 98.4 F (36.9 C) 98.6 F (37 C)  TempSrc: Oral Oral Oral Oral  SpO2:  100%  (!) 84%  Weight:      Height:        CBC:  Recent Labs  Lab 04/25/19 0739 04/30/19 0721  WBC 4.2 4.4  NEUTROABS 2.3  --   HGB 12.2* 12.2*  HCT 36.6* 37.5*  MCV 94.6 94.9  PLT 262 [id number]    Basic Metabolic Panel:  Recent Labs  Lab 04/25/19 0739 04/30/19 0721  NA 141 141  K 3.8 3.3*  CL 108 105  CO2 21* 26  GLUCOSE 79 90  BUN 10 14  CREATININE 0.85 0.96  CALCIUM 9.3 9.6   IMAGING past 24h No results found.    PHYSICAL EXAM     General - Well nourished, well developed, restless and intermittently agitated  Ophthalmologic - fundi not visualized due to noncooperation.  Cardiovascular - Regular rate and rhythm.  Mental Status -  Awake and alert, orientated to self and place, but not orientated to age or time. Language exam not able to speak in sentence, repeating words with perseveration and stuttering. Able to repeat but anomia. Psychomotor slowing.   Cranial Nerves II - XII - II - left dense homonymous hemianopia. III, IV, VI - Extraocular movements intact but right gaze preference with slight limitation of left lateral gaze V - Facial sensation intact bilaterally. VII - Facial movement intact bilaterally. VIII - Hearing & vestibular intact bilaterally. X - Palate elevates symmetrically. XI - Chin turning & shoulder shrug intact bilaterally. XII - Tongue protrusion intact.  Motor Strength - [name] was normal in right sided  extremities and mild 4/5 weakness on the left.  Mild weakness of left grip. Bulk was normal and fasciculations were absent.    Mild action tremor of the left upper and lower extremity.  No definite choreiform movements noted Motor Tone - Muscle tone was assessed at the neck and appendages and was normal.  Reflexes - [name] were symmetrical in all extremities and he had no pathological reflexes.  Sensory - Light touch, temperature/pinprick were assessed and were symmetrical.   Coordination impaired left finger-to-nose and knee to heel coordination. Gait and Station - not tested   ASSESSMENT/PLAN Joe Perry is a 68 y.o. 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 Old R frontal infarct.    CTA head & neck - R P1 occlusion. Small BA w/ high grade atheromatous narrowing. L ICA siphon stenosis.  CT perfusion 19 cc penumbra  Cerebral angio TICI 2b revascularization occluded R PCOM and P PCA. Underlying large vessel atherosclerosis   MRI Rt PCA infarct    CT repeat - 04/11/2019 - Evolving R PCA infarct. No progression. No  hemorrhage  Repeat MRI w/w/o contrast   2D Echo EF 60 to 65%.   Continue tele monitoring, so far no afib found   HIV neg  LDL 101   HgbA1c 9.8  P2Y12 90  Lovenox 40 mg sq daily  for VTE prophylaxis  aspirin 81 mg daily and clopidogrel 75 mg daily prior to admission, put on aspirin 325 mg daily and clopidogrel 75 mg daily. However, developed itchiness, plavix was discontinued. Now on ASA 81 and Brilinta 90 bid. Continue on discharge.  Therapy recommendations:  SNF  Disposition:  Pending  Hx stroke/TIA  11/2018 - presented for dizziness and unsteady, MRI showed right frontal and parietal small white matter infarcts. No LVO. Extensive SVD.   Put on DAPT.  Patient signed out [facility]  01/2016 - admitted for gait disability.  MRI negative for acute infarct.  Old infarcts B BG, thalami, L cerebellum.   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 However, pt developed vomiting episodes x 2 days. Improved after off abilify.  Hemichorea movement slowly improving  ?? PD with left hand tremor diagnosed in [place] - wife not sure if he was on any medication before   Cognitive decline, agitation and restless  CT head unchanged; EEG no seizure  Intermittent restless  Did not sleep last night as per RN  Increase seroquel to 50 QHs  And Eliquis 20 5 in the morning  Continue aricept 5mg  daily - increase to 10mg  in one month (05/16/2019) if tolerated  Hypertension  Home meds:  None listed  Stable 130-140s  Lotensin 5mg  daily stopped 8/8   If BP in the 150s, resume lower dose lotensin . BP goal normotensive  UTI, resolved  U/A WBC > 50  IV Rocephin 8/9 x 3 doses - completed  UCx w/ mult morphocytoes, recollection x 2 w/ multiple species  Hyperlipidemia  Home meds:  No statin listed  LDL 101, goal < 70  AST and ALT normalized   Continue Lipitor 40  Continue statin on discharge  Diabetes type II, uncontrolled->stable  Home meds:  glucophage 1000 bid  HgbA1c 9.8, goal < 7.0  On metformin 1000mg  bid from 7/11  CBGs  SSI  Dysphagia, resolved . On D1 thin liquids, meds in puree  Other Stroke Risk Factors  Advanced age  Overweight, Body mass index is 29.3 kg/m., recommend weight loss, diet and exercise as appropriate   No driving due to hemianopia, discussed with wife in the past  Other Active Problems  Constipation, abd xray ok, put on senokot, prn bisacodyl and fleets  N/V - resolved  Chronic stuttering - as per wife  Hypokalemia 3.3 - supplement    Hospital day # 52   [name], MD PhD Stroke Neurology 05/01/2019 4:06 PM   To contact Stroke Continuity provider, please refer to [email or web address] After hours, contact General Neurology

## 2019-05-01 NOTE — Progress Notes (Signed)
Occupational Therapy Treatment Patient Details Name: Joe Perry MRN: [id number] DOB: 1951/03/02 Today's Date: 05/01/2019    History of present illness 68 yo male s/p  R PCOM and R PCA occlusion s/p emergent thrombectomy on 7/6. Pt admitteed with L sided weakness, R gaze perference, L homonymous hemianopsia. 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   OT comments  Treatment session with focus on functional mobility, dynamic standing balance, Lt attention, and functional use of LUE during self-care tasks.  Completed bed mobility with min assist and multimodal cues for hand placement and sequencing during sit > stand.  +2 for safety with mobility with RW due to increased Lt lean as pt fatigued and inconsistency during mobility.  Engaged in grooming in standing at sink and LB dressing to address Lt attention, sequencing, and awareness.  Pt demonstrating increased instability in standing requiring seated rest break.  Pt impulsive and perseverating on LB dressing despite cues to restart task due to errors.  Pt with increased instability when returning to bed, requiring Mod A +2 for safety with RW and increased cues for stepping pattern when turning to approach bed.  Pt will continue to benefit from OT services in preparation for next venue of care.  Follow Up Recommendations  SNF;Supervision/Assistance - 24 hour    Equipment Recommendations  None recommended by OT       Precautions / Restrictions Precautions Precautions: Fall Precaution Comments: L lateral lean, L hemianopsia, L inattention, impulsive Restrictions Weight Bearing Restrictions: No       Mobility Bed Mobility Overal bed mobility: Needs Assistance Bed Mobility: Supine to Sit;Sit to Supine Rolling: Min guard   Supine to sit: Min assist Sit to supine: Supervision      Transfers Overall transfer level: Needs assistance Equipment used: Rolling walker (2 wheeled) Transfers: Sit to/from Stand Sit to Stand: Min  assist Stand pivot transfers: +2 physical assistance;Mod assist       General transfer comment: Min assist to help pt power up to stand, stabilize RW.  Cues for proper hand placement requiring multimodal cues due to perseveration.        ADL either performed or assessed with clinical judgement   ADL Overall ADL's : Needs assistance/impaired     Grooming: Wash/dry face;Standing Grooming Details (indicate cue type and reason): Pt requires min A for balance as well as assist for sequencing and organization.  He requires physical assistance secondary to Lt lean with no awareness or attempts to correct.             Lower Body Dressing: Moderate assistance;+2 for safety/equipment Lower Body Dressing Details (indicate cue type and reason): Pt able to thread Lt pant leg, but when alerted to his mistake of donning pants backwards pt then required max multimodal cues for reorganize and start over.  Pt impulsive and perseverating on task despite error.             Functional mobility during ADLs: Moderate assistance;+2 for physical assistance;+2 for safety/equipment General ADL Comments: Pt requires multiple multimodal cues for organization and sequencing of functional tasks.  +2 due to Lt lean with no awareness and inability to correct once pointed out.  Pt impulsive and perseverative therefore requiring increased cues.     Vision   Visual Fields: Left visual field deficit          Cognition Arousal/Alertness: Awake/alert Behavior During Therapy: Flat affect Overall Cognitive Status: Impaired/Different from baseline Area of Impairment: Attention;Memory;Safety/judgement;Awareness;Following commands;Problem solving  Current Attention Level: Sustained Memory: Decreased recall of precautions;Decreased short-term memory Following Commands: Follows one step commands with increased time Safety/Judgement: Decreased awareness of safety Awareness:  Intellectual Problem Solving: Slow processing;Decreased initiation;Difficulty sequencing;Requires verbal cues;Requires tactile cues General Comments: Pt fatigued upon arrival, RN reports pt did not sleep much overnight. Pt agreeable to therapy session and participatory in self-care tasks.                   Pertinent Vitals/ Pain       Pain Assessment: No/denies pain Faces Pain Scale: No hurt         Frequency  Min 2X/week        Progress Toward Goals  OT Goals(current goals can now be found in the care plan section)  Progress towards OT goals: Progressing toward goals  Acute Rehab OT Goals OT Goal Formulation: Patient unable to participate in goal setting Time For Goal Achievement: 05/14/19 Potential to Achieve Goals: [place] Discharge plan remains appropriate       AM-PAC OT "6 Clicks" Daily Activity     Outcome Measure   Help from another person eating meals?: Total Help from another person taking care of personal grooming?: A Lot Help from another person toileting, which includes using toliet, bedpan, or urinal?: A Lot Help from another person bathing (including washing, rinsing, drying)?: A Lot Help from another person to put on and taking off regular upper body clothing?: A Lot Help from another person to put on and taking off regular lower body clothing?: A Lot 6 Click Score: 11    End of Session Equipment Utilized During Treatment: Gait belt;Rolling walker  OT Visit Diagnosis: Cognitive communication deficit (R41.841);Unsteadiness on feet (R26.81) Symptoms and signs involving cognitive functions: Cerebral infarction   Activity Tolerance Patient tolerated treatment well   Patient Left in bed;with call bell/phone within reach;with bed alarm set   Nurse Communication Other (comment)(performance update)        [id number] OT Time Calculation (min): 34 min  Charges: OT General Charges $OT Visit: 1 Visit OT Treatments $Self Care/Home  Management : 23-37 mins    Simonne Come, [id number] 05/01/2019, 1:41 PM

## 2019-05-02 ENCOUNTER — Inpatient Hospital Stay (HOSPITAL_COMMUNITY): Payer: Medicare Other

## 2019-05-02 LAB — GLUCOSE, CAPILLARY
Glucose-Capillary: 104 mg/dL — ABNORMAL HIGH (ref 70–99)
Glucose-Capillary: 109 mg/dL — ABNORMAL HIGH (ref 70–99)
Glucose-Capillary: 99 mg/dL (ref 70–99)
Glucose-Capillary: 118 mg/dL — ABNORMAL HIGH (ref 70–99)

## 2019-05-02 MED ORDER — LORAZEPAM 1 MG PO TABS: 2.0000 mg | ORAL_TABLET | Freq: Once | ORAL | Status: DC

## 2019-05-02 MED ORDER — GADOBUTROL 1 MMOL/ML IV SOLN
9.0000 mL | Freq: Once | INTRAVENOUS | Status: AC | PRN
  Administered 2019-05-02: 9 mL via INTRAVENOUS

## 2019-05-02 MED ORDER — LORAZEPAM 2 MG/ML IJ SOLN
2.0000 mg | Freq: Once | INTRAMUSCULAR | Status: AC
  Administered 2019-05-02: 2 mg via INTRAVENOUS
  Filled 2019-05-02: qty 1

## 2019-05-02 NOTE — TOC Progression Note (Signed)
LATE NOTE SUBMISSION     Transition of Care [facility]) - Progression Note    Patient Details  Name: CASSANOVA VINSON MRN: [id number] Date of Birth: [birth year]  Transition of Care [facility]) CM/SW [place], [place] Phone Number: 05/02/2019, 4:49 PM  Clinical Narrative:   CSW spoke with patient's brother, [name], about discharge plans. [name] asked about any updates for SNF placement, and CSW indicated that the patient still has no SNF offers. Patient now with a sitter, which is a barrier to placement. [name] asked about the possibility of bringing patient home, and CSW discussed recommendation that patient have 24/7 care at home and how that was not previously an option. [name] said that the patient's wife is becoming very anxious that he is in the hospital for such a long time and is exploring whether she could take time off from work to care for him. James asked about home health, and CSW discussed the possibility of charity home health but that would not have someone out to the house every day. [name] indicated understanding. [name] asked if anyone could come out to assess the home to see if they thought it was fit for him to return to, and CSW said that no one would be able to do that from the hospital.   [name] to discuss with patient's wife and either [name] or [name] will call CSW back after discussion. Patient remains on difficult to place list with no bed offers.    Expected Discharge Plan: [place] Barriers to Discharge: Continued Medical Work up, SNF Pending Medicaid  Expected Discharge Plan and Services Expected Discharge Plan: [place] In-house Referral: Clinical Social Work Discharge Planning Services: NA Post Acute Care Choice: [place] Living arrangements for the past 2 months: Single Family Home                 DME Arranged: N/A DME Agency: NA     Representative spoke with at DME Agency: na HH Arranged: NA [place] Agency:  NA         Social Determinants of Health (SDOH) Interventions    Readmission Risk Interventions No flowsheet data found.

## 2019-05-02 NOTE — Progress Notes (Signed)
Physical Therapy Treatment Patient Details Name: Joe Perry MRN: [id number] DOB: July 14, 1951 Today's Date: 05/02/2019    History of Present Illness 68 yo male s/p  R PCOM and R PCA occlusion s/p emergent thrombectomy on 7/6. Pt admitteed with L sided weakness, R gaze perference, L homonymous hemianopsia. 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    PT Comments    Pt continues to demonstrate significant deficits in safety, balance and coordination. Ataxic gait noted requiring +2 mod assist. Pt ambulated with RW and HHA. Improved gait quality and stability noted with +2 HHA vs RW. Pt appeared more restful following therapy session. Pt positioned in recliner: feet elevated, posey belt, chair alarm, bilat mitts and fall mat in place. RN notified.    Follow Up Recommendations  SNF;Supervision/Assistance - 24 hour     Equipment Recommendations  Rolling walker with 5" wheels;3in1 (PT);Wheelchair (measurements PT);Wheelchair cushion (measurements PT)    Recommendations for Other Services       Precautions / Restrictions Precautions Precautions: Fall;Other (comment) Precaution Comments: L lateral lean, L hemianopsia, L inattention, impulsive Restrictions Weight Bearing Restrictions: No    Mobility  Bed Mobility Overal bed mobility: Needs Assistance Bed Mobility: Supine to Sit;Rolling Rolling: Supervision   Supine to sit: Min assist;HOB elevated     General bed mobility comments: assist to elevate trunk  Transfers Overall transfer level: Needs assistance Equipment used: Ambulation equipment used Transfers: Sit to/from Stand Sit to Stand: Mod assist         General transfer comment: assist to power up and stabilize balance  Ambulation/Gait Ambulation/Gait assistance: Mod assist;+2 physical assistance Gait Distance (Feet): 75 Feet(x 2) Assistive device: None;Rolling walker (2 wheeled) Gait Pattern/deviations: Step-through pattern;Scissoring;Staggering left;Drifts  right/left;Narrow base of support;Decreased step length - left Gait velocity: decreased Gait velocity interpretation: <1.8 ft/sec, indicate of risk for recurrent falls General Gait Details: Ambulated +2 HHA and with RW. Improved gait quality and stability with HHA vs RW. Poor/absent righting reactions during LOB. Pt demonstrates increased difficulty maneuvering around obstacles.   Stairs             Wheelchair Mobility    Modified Rankin (Stroke Patients Only)       Balance Overall balance assessment: Needs assistance Sitting-balance support: Feet supported;No upper extremity supported Sitting balance-[name]: Good Sitting balance - Comments: able to maintain EOB sitting with min guard assist    Standing balance support: Bilateral upper extremity supported Standing balance-[name]: Poor Standing balance comment: reliant on external support                            Cognition Arousal/Alertness: Awake/alert Behavior During Therapy: Flat affect Overall Cognitive Status: Impaired/Different from baseline Area of Impairment: Attention;Memory;Safety/judgement;Awareness;Following commands;Problem solving;Orientation                 Orientation Level: Time;Situation Current Attention Level: Sustained Memory: Decreased recall of precautions;Decreased short-term memory Following Commands: Follows one step commands with increased time Safety/Judgement: Decreased awareness of safety;Decreased awareness of deficits Awareness: Intellectual Problem Solving: Slow processing;Decreased initiation;Difficulty sequencing;Requires verbal cues;Requires tactile cues General Comments: Pt tangled in bed on arrival. He was horizontal across bed. Posey belt in place but twisted multiple times behind pt (assuming from pt spinning around in bed).      Exercises      General Comments        Pertinent Vitals/Pain Pain Assessment: Faces Pain Score: 0-No pain     Home  Living                      Prior Function            PT Goals (current goals can now be found in the care plan section) Acute Rehab PT Goals Patient Stated Goal: did not state PT Goal Formulation: Patient unable to participate in goal setting Time For Goal Achievement: 05/05/19 Potential to Achieve Goals: Fair Progress towards PT goals: Progressing toward goals    Frequency    Min 3X/week      PT Plan Current plan remains appropriate    Co-evaluation              AM-PAC PT "6 Clicks" Mobility   Outcome Measure  Help needed turning from your back to your side while in a flat bed without using bedrails?: None Help needed moving from lying on your back to sitting on the side of a flat bed without using bedrails?: A Little Help needed moving to and from a bed to a chair (including a wheelchair)?: A Lot Help needed standing up from a chair using your arms (e.g., wheelchair or bedside chair)?: A Little Help needed to walk in hospital room?: A Lot Help needed climbing 3-5 steps with a railing? : A Lot 6 Click Score: 16    End of Session Equipment Utilized During Treatment: Gait belt Activity Tolerance: Patient tolerated treatment well Patient left: in chair;with call bell/phone within reach;with restraints reapplied;with chair alarm set Nurse Communication: Mobility status;Other (comment)(Pt in chair with posey belt, chair alarm, fall mat, and bilat mitts.) PT Visit Diagnosis: Muscle weakness (generalized) (M62.81);Hemiplegia and hemiparesis;Other abnormalities of gait and mobility (R26.89) Hemiplegia - Right/Left: Left Hemiplegia - dominant/non-dominant: Dominant Hemiplegia - caused by: Cerebral infarction     Time: 1020-1059 PT Time Calculation (min) (ACUTE ONLY): 39 min  Charges:  $Gait Training: 23-37 mins $Therapeutic Activity: 23-37 mins                     Lorrin Goodell, PT  Office # [phone] Pager [phone]    Lorriane Shire 05/02/2019, 12:31 PM

## 2019-05-02 NOTE — Progress Notes (Signed)
STROKE TEAM PROGRESS NOTE   Interval history:  Pt lying in bed, calmer than yesterday. No significant neuro change. MRI pending. Will give ativan for MRI quality.    Vitals:   05/01/19 2350 05/02/19 0343 05/02/19 0952 05/02/19 1151  BP: 128/82 (!) 120/92 126/90 95/68  Pulse: 98 76 81 (!) 59  Resp: 16 18 18 18   Temp: 98.4 F (36.9 C) (!) 97.5 F (36.4 C) 98.1 F (36.7 C) 98.5 F (36.9 C)  TempSrc: Oral Oral Oral Oral  SpO2: 100% 100% 96% 100%  Weight:      Height:        CBC:  Recent Labs  Lab 04/30/19 0721  WBC 4.4  HGB 12.2*  HCT 37.5*  MCV 94.9  PLT [id number]    Basic Metabolic Panel:  Recent Labs  Lab 04/30/19 0721  NA 141  K 3.3*  CL 105  CO2 26  GLUCOSE 90  BUN 14  CREATININE 0.96  CALCIUM 9.6   IMAGING past 24h No results found.    PHYSICAL EXAM      General - Well nourished, well developed, mildly restless while awake  Ophthalmologic - fundi not visualized due to noncooperation.  Cardiovascular - Regular rate and rhythm.  Mental Status -  Awake and alert, orientated to self and place, but not orientated to age or time. Language exam not able to speak in sentence, repeating words with perseveration and stuttering. Able to repeat but anomia. Psychomotor slowing.   Cranial Nerves II - XII - II - left dense homonymous hemianopia. III, IV, VI - Extraocular movements intact but right gaze preference with slight limitation of left lateral gaze V - Facial sensation intact bilaterally. VII - Facial movement intact bilaterally. VIII - Hearing & vestibular intact bilaterally. X - Palate elevates symmetrically. XI - Chin turning & shoulder shrug intact bilaterally. XII - Tongue protrusion intact.  Motor Strength - [name] was normal in right sided extremities and mild 4/5 weakness on the left.  Mild weakness of left grip. Bulk was normal and fasciculations were absent.    Mild action tremor of the left upper and lower extremity.  No  definite choreiform movements noted Motor Tone - Muscle tone was assessed at the neck and appendages and was normal.  Reflexes - [name] were symmetrical in all extremities and he had no pathological reflexes.  Sensory - Light touch, temperature/pinprick were assessed and were symmetrical.   Coordination impaired left finger-to-nose and knee to heel coordination. Gait and Station - not tested   ASSESSMENT/PLAN [name] is a 68 y.o. 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 Old R frontal infarct.    CTA head & neck - R P1 occlusion. Small BA w/ high grade atheromatous narrowing. L ICA siphon stenosis.  CT perfusion 19 cc penumbra  Cerebral angio TICI 2b revascularization occluded R PCOM and P PCA. Underlying large vessel atherosclerosis   MRI Rt PCA infarct    CT repeat - 04/11/2019 - Evolving R PCA infarct. No progression. No hemorrhage  Repeat MRI w/w/o contrast pending   2D Echo EF 60 to 65%.   Continue tele monitoring, so far no afib found   HIV neg  LDL 101   HgbA1c 9.8  P2Y12 90  Lovenox 40 mg sq daily  for VTE prophylaxis  aspirin 81 mg daily and clopidogrel 75 mg daily prior to admission, put on aspirin 325 mg daily and clopidogrel 75 mg daily. However, developed itchiness, plavix was discontinued. Now on ASA 81 and Brilinta 90 bid. Continue on discharge.  Therapy recommendations:  SNF  Disposition:  Pending  Hx stroke/TIA  11/2018 - presented for dizziness and unsteady, MRI showed right frontal and parietal small white matter infarcts. No LVO. Extensive SVD.  Put on DAPT.  Patient signed out [facility]  01/2016 - admitted for gait disability.  MRI negative for acute infarct.  Old infarcts B BG, thalami, L cerebellum.   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 However, pt developed vomiting episodes x 2 days. Improved after off abilify.  Hemichorea movement slowly improving  ?? PD with left hand tremor diagnosed in [place] - wife not sure if he was on any medication before   Cognitive decline, agitation and restless  CT head unchanged; EEG no seizure  Intermittent restless  Did not sleep last night as per RN  Increase seroquel to 50 QHs and 25 q am  Continue aricept 5mg  daily - increase to 10mg  in one month (05/16/2019) if tolerated  Ativan on call to MRI  Hypertension  Home meds:  None listed  Stable 130-140s  Lotensin 5mg  daily stopped 8/8   If BP in the 150s, resume lower dose lotensin . BP goal normotensive  UTI, resolved  U/A WBC > 50  IV Rocephin 8/9 x 3 doses - completed  UCx w/ mult morphocytoes, recollection x 2 w/ multiple species  Hyperlipidemia  Home meds:  No statin listed  LDL 101, goal < 70  AST and ALT normalized   Continue Lipitor 40  Continue statin on discharge  Diabetes type II, uncontrolled->stable  Home meds:  glucophage 1000 bid  HgbA1c 9.8, goal < 7.0  On metformin 1000mg  bid from 7/11  CBGs  SSI  Dysphagia, resolved . On D1 thin liquids, meds in puree  Other Stroke Risk Factors  Advanced age  Overweight, Body mass index is 29.3 kg/m., recommend weight loss, diet and exercise as appropriate   No driving due to hemianopia, discussed with wife in the past  Other Active Problems  Constipation, abd xray ok, put on senokot, prn bisacodyl and fleets  N/V - resolved  Chronic stuttering - as per wife  Hypokalemia 3.3 - supplement   Hospital day # 66   Joe Hawking, MD PhD Stroke Neurology 05/02/2019 1:56 PM   To contact Stroke Continuity provider, please refer to [email or web address] After  hours, contact General Neurology

## 2019-05-02 NOTE — Progress Notes (Signed)
Ongoing DCP in progress. Joe Perry [phone]) stated that spouse was looking into Family Medical Leave from work to assist the patient at home; [facility] application pending; no bed offers at this time; [place]

## 2019-05-03 LAB — BASIC METABOLIC PANEL
Anion gap: 11 (ref 5–15)
BUN: 17 mg/dL (ref 8–23)
CO2: 26 mmol/L (ref 22–32)
Calcium: 9.6 mg/dL (ref 8.9–10.3)
Chloride: 108 mmol/L (ref 98–111)
Creatinine, Ser: 0.88 mg/dL (ref 0.61–1.24)
GFR calc Af Amer: >60 mL/min (ref >60)
GFR calc non Af Amer: >60 mL/min (ref >60)
Glucose, Bld: 99 mg/dL (ref 70–99)
Potassium: 3.9 mmol/L (ref 3.5–5.1)
Sodium: 145 mmol/L (ref 135–145)

## 2019-05-03 LAB — CBC
HCT: 40.5 % (ref 39.0–52.0)
Hemoglobin: 13 g/dL (ref 13.0–17.0)
MCH: 31.1 pg (ref 26.0–34.0)
MCHC: 32.1 g/dL (ref 30.0–36.0)
MCV: 96.9 fL (ref 80.0–100.0)
Platelets: 247 10*3/uL (ref 150–400)
RBC: 4.18 MIL/uL — ABNORMAL LOW (ref 4.22–5.81)
RDW: 13.3 % (ref 11.5–15.5)
WBC: 4.2 10*3/uL (ref 4.0–10.5)
nRBC: 0 % (ref 0.0–0.2)

## 2019-05-03 LAB — GLUCOSE, CAPILLARY
Glucose-Capillary: 97 mg/dL (ref 70–99)
Glucose-Capillary: 100 mg/dL — ABNORMAL HIGH (ref 70–99)
Glucose-Capillary: 85 mg/dL (ref 70–99)
Glucose-Capillary: 117 mg/dL — ABNORMAL HIGH (ref 70–99)

## 2019-05-03 NOTE — Progress Notes (Signed)
STROKE TEAM PROGRESS NOTE   Interval history:  Neuro stable. MRI evolving right PCA stroke and new bilateral acute to subacute infarcts in the left BG and right cerebellar peduncle  Vitals:   05/03/19 0027 05/03/19 0301 05/03/19 0744 05/03/19 1124  BP: 103/65 116/70 122/83 127/87  Pulse: 63 (!) 55 (!) 54 (!) 54  Resp: 16 18 14 16   Temp: 97.6 F (36.4 C) 98.3 F (36.8 C) 97.7 F (36.5 C) 97.7 F (36.5 C)  TempSrc: Oral Oral Oral Oral  SpO2: 99% 100% 99% 98%  Weight:      Height:        CBC:  Recent Labs  Lab 04/30/19 0721 05/03/19 0749  WBC 4.4 4.2  HGB 12.2* 13.0  HCT 37.5* 40.5  MCV 94.9 96.9  PLT 264 [id number]    Basic Metabolic Panel:  Recent Labs  Lab 04/30/19 0721 05/03/19 0749  NA 141 145  K 3.3* 3.9  CL 105 108  CO2 26 26  GLUCOSE 90 99  BUN 14 17  CREATININE 0.96 0.88  CALCIUM 9.6 9.6   IMAGING past 24h Mr Jeri Cos Wo Contrast  Result Date: 05/02/2019 CLINICAL DATA:  68 year old male with right side posterior circulation emergent large vessel occlusion in July treated with endovascular revascularization. Right PCA territory infarct. Left hemi chorea, cognitive decline, agitation. EXAM: MRI HEAD WITHOUT AND WITH CONTRAST TECHNIQUE: Multiplanar, multiecho pulse sequences of the brain and surrounding structures were obtained without and with intravenous contrast. CONTRAST:  9 milliliters Gadavist COMPARISON:  Brain MRI 03/11/2019. FINDINGS: Brain: Developing encephalomalacia and the right PCA territory infarct which also involve the right splenium and thalamus. There is mild residual trace diffusion abnormality in the splenium, only some of which appears to be restricted. There is confluent petechial hemorrhage in the medial left occipital lobe, ventral left thalamus. And there is post ischemic enhancement at both of those sites. Diffusion-weighted imaging is noisy due to motion, but there seems to be a small area of restricted diffusion in the left corona radiata  tracking toward the caudate on series 2, image 29. Underlying chronic small vessel ischemia in the bilateral deep gray nuclei and cerebral white matter. And also there are curvilinear areas of likely post ischemic enhancement near this region (series 5, image 16). Additionally, there is evidence of restricted diffusion in the right cerebellar peduncle (coronal series 5, image 20) which demonstrates new patchy T2 and FLAIR hyperintensity (series 9, image 6). There is superimposed chronic pontine and left cerebellar peduncle ischemia. There is also superimposed chronic ischemia in the anterior right MCA territory, and a small chronic infarct in the right cerebellum. No other diffusion restriction identified. No other abnormal enhancement. No dural thickening. Chronic hemosiderin in the anterior right MCA territory. No other chronic blood products. Stable cerebral volume and ventricular system. No midline shift, mass effect, evidence of mass lesion, or acute intracranial hemorrhage. Cervicomedullary junction and pituitary are within normal limits. Vascular: Major intracranial vascular flow voids are stable. The major dural venous sinuses are enhancing and appear to be patent. Skull and upper cervical spine: Negative visible cervical spine. Normal bone marrow signal. Sinuses/Orbits: Negative orbits. Paranasal sinuses and mastoids are stable and well pneumatized. Other: Filling defect within the left internal auditory canal which enhances following contrast on series 3, image 13. In retrospect this was visible in March. This is 4-5 millimeters. The right side internal auditory structures appear grossly normal. IMPRESSION: 1. Resolving diffusion restriction in the right PCA territory, with patchy residual  in the right splenium. Petechial hemorrhage and post ischemic enhancement in the right thalamus and right occipital lobe. No mass effect. 2. Underlying severe chronic ischemic disease with superimposed new small acute to  subacute infarcts in the: - left basal ganglia. - right cerebellar peduncle. 3. 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 Electronically Signed   By: Genevie Ann M.D.   On: 05/02/2019 20:54      PHYSICAL EXAM      General - Well nourished, well developed, mildly restless while awake  Ophthalmologic - fundi not visualized due to noncooperation.  Cardiovascular - Regular rate and rhythm.  Mental Status -  Awake and alert, orientated to self and place, but not orientated to age or time. Language exam not able to speak in sentence, repeating words with perseveration and stuttering. Able to repeat but anomia. Psychomotor slowing.   Cranial Nerves II - XII - II - left dense homonymous hemianopia. III, IV, VI - Extraocular movements intact but right gaze preference with slight limitation of left lateral gaze V - Facial sensation intact bilaterally. VII - Facial movement intact bilaterally. VIII - Hearing & vestibular intact bilaterally. X - Palate elevates symmetrically. XI - Chin turning & shoulder shrug intact bilaterally. XII - Tongue protrusion intact.  Motor Strength - [name] was normal in right sided extremities and mild 4/5 weakness on the left.  Mild weakness of left grip. Bulk was normal and fasciculations were absent.    Mild action tremor of the left upper and lower extremity.  No definite choreiform movements noted Motor Tone - Muscle tone was assessed at the neck and appendages and was normal.  Reflexes - [name] were symmetrical in all extremities and he had no pathological reflexes.  Sensory - Light touch, temperature/pinprick were assessed and were symmetrical.   Coordination impaired left finger-to-nose and knee to heel coordination. Gait and Station - not tested   ASSESSMENT/PLAN [name] is a 68 y.o. 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     Stroke:   R large PCA infarct s/p IR w/ TICI2b revascularization, secondary to large vessel disease . Severe chronic ishemic disease. Acute/subacute bilateral infarcts(MRI 05/02/2019) may be due to small vessel disease. Code Stroke CT head No acute abnormality. Old R frontal infarct.    CTA head & neck - R P1 occlusion. Small BA w/ high grade atheromatous narrowing. L ICA siphon stenosis.  CT perfusion 19 cc penumbra  Cerebral angio TICI 2b revascularization occluded R PCOM and P PCA. Underlying large vessel atherosclerosis   MRI Rt PCA infarct    CT repeat - 04/11/2019 - Evolving R PCA infarct. No progression. No hemorrhage  Repeat MRI w/w/o contrast new acute/subacute infarcts left BG and right cerebellar peduncle 05/02/2019   2D Echo EF 60 to 65%.   Continue tele monitoring, so far no afib found   HIV neg  LDL 101   HgbA1c 9.8  P2Y12 90  Lovenox 40 mg sq daily  for VTE prophylaxis  aspirin 81 mg daily and clopidogrel 75 mg daily prior to admission, put on aspirin 325 mg daily and clopidogrel 75 mg daily. However, developed itchiness, plavix was discontinued. Now on ASA 81 and Brilinta 90 bid. Continue on discharge.  Therapy recommendations:  SNF  Disposition:  Pending  Hx stroke/TIA    11/2018 - presented for dizziness and unsteady, MRI showed right frontal  and parietal small white matter infarcts. No LVO. Extensive SVD.  Put on DAPT.  Patient signed out [facility]  05/02/2019: Repeat MRI w/w/o contrast new acute/subacute infarcts left BG and right cerebellar peduncle 05/02/2019, 4-25mm vestibular schwannoma  01/2016 - admitted for gait disability.  MRI negative for acute infarct.  Old infarcts B BG, thalami, L cerebellum.   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 However, pt developed vomiting episodes x 2 days. Improved after  off abilify.  Hemichorea movement slowly improving  ?? PD with left hand tremor diagnosed in [place] - wife not sure if he was on any medication before   Cognitive decline, agitation and restless  CT head unchanged; EEG no seizure  Intermittent restless  Did not sleep last night as per RN  Increase seroquel to 50 QHs and 25 q am  Continue aricept 5mg  daily - increase to 10mg  in one month (05/16/2019) if tolerated  Ativan on call to MRI  Hypertension  Home meds:  None listed  Stable 130-140s  Lotensin 5mg  daily stopped 8/8   If BP in the 150s, resume lower dose lotensin . BP goal normotensive  UTI, resolved  U/A WBC > 50  IV Rocephin 8/9 x 3 doses - completed  UCx w/ mult morphocytoes, recollection x 2 w/ multiple species  Hyperlipidemia  Home meds:  No statin listed  LDL 101, goal < 70  AST and ALT normalized   Continue Lipitor 40  Continue statin on discharge  Diabetes type II, uncontrolled->stable  Home meds:  glucophage 1000 bid  HgbA1c 9.8, goal < 7.0  On metformin 1000mg  bid from 7/11  CBGs  SSI  Dysphagia, resolved . 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 Will review tele for afib.   Personally examined patient and images, and have participated in and made any corrections needed to history, physical, neuro exam,assessment and plan as stated above.   I have personally obtained the history, evaluated lab date, reviewed imaging studies and agree with radiology interpretations.    Sarina Ill, MD Stroke Neurology   A total of 25 minutes was spent for the care of this patient, spent on counseling patient and family on different diagnostic and therapeutic options, counseling and coordination of care, riskd ans benefits of management, compliance, or risk factor reduction and education.   To contact Stroke Continuity provider, please refer to [email or web address] After hours, contact General Neurology

## 2019-05-04 LAB — GLUCOSE, CAPILLARY
Glucose-Capillary: 97 mg/dL (ref 70–99)
Glucose-Capillary: 115 mg/dL — ABNORMAL HIGH (ref 70–99)
Glucose-Capillary: 96 mg/dL (ref 70–99)
Glucose-Capillary: 99 mg/dL (ref 70–99)

## 2019-05-04 NOTE — Progress Notes (Signed)
STROKE TEAM PROGRESS NOTE   Interval history:  Neuro stable. MRI evolving right PCA stroke and new bilateral acute to subacute infarcts in the left BG and right cerebellar peduncle. No afib on tele.   Vitals:   05/03/19 2011 05/04/19 0534 05/04/19 0732 05/04/19 1121  BP:  132/79 130/80 (!) 132/51  Pulse: 71 69 67 76  Resp: 16  16 16   Temp: 97.6 F (36.4 C) 97.6 F (36.4 C) 97.7 F (36.5 C)   TempSrc: Oral Axillary Oral   SpO2: 100% 100% 99% 99%  Weight:      Height:        CBC:  Recent Labs  Lab 04/30/19 0721 05/03/19 0749  WBC 4.4 4.2  HGB 12.2* 13.0  HCT 37.5* 40.5  MCV 94.9 96.9  PLT 264 [id number]    Basic Metabolic Panel:  Recent Labs  Lab 04/30/19 0721 05/03/19 0749  NA 141 145  K 3.3* 3.9  CL 105 108  CO2 26 26  GLUCOSE 90 99  BUN 14 17  CREATININE 0.96 0.88  CALCIUM 9.6 9.6   IMAGING past 24h No results found.    PHYSICAL EXAM      General - Well nourished, well developed, mildly restless while awake, sitting in chair, interacts with physician and nods to questions  Ophthalmologic - fundi not visualized due to noncooperation.  Cardiovascular - Regular rate and rhythm.  Mental Status -  Awake and alert, orientated to self and place, but not orientated to age or time. Language exam not able to speak in sentence, repeating words with perseveration and stuttering. Able to repeat but anomia. Psychomotor slowing. Nods to questions, follows some simple commands (some behavioral issues noted when he doesn't want to interact)  Cranial Nerves II - XII - II - left dense homonymous hemianopia. III, IV, VI - Extraocular movements intact but right gaze preference with slight limitation of left lateral gaze V - Facial sensation intact bilaterally. VII - Facial movement intact bilaterally. VIII - Hearing & vestibular intact bilaterally. X - Palate elevates symmetrically. XI - Chin turning & shoulder shrug intact bilaterally. XII - Tongue protrusion  intact.  Motor Strength - [name] was normal in right sided extremities and mild 4/5 weakness on the left.  Mild weakness of left grip. Bulk was normal and fasciculations were absent.    Mild action tremor of the left upper and lower extremity.  No definite choreiform movements noted Motor Tone - Muscle tone was assessed at the neck and appendages and was normal.  Reflexes - [name] were symmetrical in all extremities and he had no pathological reflexes.  Sensory - Light touch, temperature/pinprick were assessed and were symmetrical.  Coordination impaired left finger-to-nose and knee to heel coordination.Gait and Station - not tested   ASSESSMENT/PLAN Joe Perry is a 68 y.o. 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    Stroke:   R large PCA infarct s/p IR w/ TICI2b revascularization, secondary to large vessel disease . Severe chronic ishemic disease. Acute/subacute bilateral infarcts(MRI 05/02/2019) may be due to small vessel disease. Code Stroke CT head No acute abnormality. Old R frontal infarct.    CTA head & neck - R P1 occlusion. Small BA w/ high grade atheromatous narrowing. L ICA siphon stenosis.  CT perfusion 19 cc penumbra  Cerebral angio TICI 2b revascularization occluded R PCOM and P PCA. Underlying large vessel atherosclerosis   MRI Rt  PCA infarct    CT repeat - 04/11/2019 - Evolving R PCA infarct. No progression. No hemorrhage  Repeat MRI w/w/o contrast new acute/subacute infarcts left BG and right cerebellar peduncle 05/02/2019   2D Echo EF 60 to 65%.   Continue tele monitoring, so far no afib found   HIV neg  LDL 101   HgbA1c 9.8  P2Y12 90  Lovenox 40 mg sq daily  for VTE prophylaxis  aspirin 81 mg daily and clopidogrel 75 mg daily prior to admission, put on aspirin 325 mg daily and clopidogrel 75 mg daily. However, developed itchiness, plavix was discontinued. Now on  ASA 81 and Brilinta 90 bid. Continue on discharge.  Therapy recommendations:  SNF  Disposition:  Pending  Hx stroke/TIA    11/2018 - presented for dizziness and unsteady, MRI showed right frontal and parietal small white matter infarcts. No LVO. Extensive SVD.  Put on DAPT.  Patient signed out [facility]  05/02/2019: Repeat MRI w/w/o contrast new acute/subacute infarcts left BG and right cerebellar peduncle 05/02/2019, 4-29mm vestibular schwannoma  01/2016 - admitted for gait disability.  MRI negative for acute infarct.  Old infarcts B BG, thalami, L cerebellum.   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 However, pt developed vomiting episodes x 2 days. Improved after off abilify.  Hemichorea movement slowly improving, none seen today  ?? PD with left hand tremor diagnosed in [place] - wife not sure if he was on any medication before.   Cognitive decline, agitation and restless  CT head unchanged; EEG no seizure  Intermittent restless  Did not sleep last night as per RN  seroquel to 50 QHs and 25 q am  Continue aricept 5mg  daily - increase to 10mg  in one month (05/16/2019) if tolerated  Hypertension  Home meds:  None listed  Stable 130-140s  Lotensin 5mg  daily stopped 8/8   If BP in the 150s, resume lower dose lotensin . BP goal normotensive  UTI, resolved  U/A WBC > 50  IV Rocephin 8/9 x 3 doses - completed  UCx w/ mult morphocytoes, recollection x 2 w/ multiple species  Hyperlipidemia  Home meds:  No statin listed  LDL 101, goal < 70  AST and ALT normalized   Continue Lipitor 40  Continue statin on discharge  Diabetes type II, uncontrolled->stable  Home meds:  glucophage 1000 bid  HgbA1c 9.8, goal < 7.0  On metformin 1000mg  bid from 7/11  CBGs  SSI  Dysphagia, resolved . On D1 thin liquids, meds in  puree  Other Stroke Risk Factors  Advanced age  Overweight, Body mass index is 29.3 kg/m., recommend weight loss, diet and exercise as appropriate   No driving due to hemianopia, discussed with wife in the past  Other Active Problems  Constipation, abd xray ok, put on senokot, prn bisacodyl and fleets  N/V - resolved  Chronic stuttering - as per wife  Hypokalemia 3.3 - resolved  4-1mm vestibular schwannoma, needs outpatient follow up   Hospital day # 55    Repeat MRI w/w/o contrast new acute/subacute infarcts left BG and right cerebellar peduncle 05/02/2019, 4-75mm vestibular schwannoma. tele negative for afib. May be small-vessel strokes, defer to Dr. Leonie Man in the morning  Personally examined patient and images, and have participated in and made any corrections needed to history, physical, neuro exam,assessment and plan as stated above.  I have personally obtained the history, evaluated lab date, reviewed imaging studies and agree with radiology interpretations.    Sarina Ill, MD Stroke Neurology   A total of 25 minutes was spent for the care of this patient, spent on counseling patient and family on different diagnostic and therapeutic options, counseling and coordination of care, riskd ans benefits of management, compliance, or risk factor reduction and education.   To contact Stroke Continuity provider, please refer to [email or web address] After hours, contact General Neurology

## 2019-05-05 LAB — GLUCOSE, CAPILLARY
Glucose-Capillary: 77 mg/dL (ref 70–99)
Glucose-Capillary: 122 mg/dL — ABNORMAL HIGH (ref 70–99)
Glucose-Capillary: 82 mg/dL (ref 70–99)
Glucose-Capillary: 79 mg/dL (ref 70–99)

## 2019-05-05 NOTE — Progress Notes (Addendum)
Physical Therapy Treatment Patient Details Name: Joe Perry MRN: [id number] DOB: 01-25-1951 Today's Date: 05/05/2019    History of Present Illness 68 yo male s/p  R PCOM and R PCA occlusion s/p emergent thrombectomy on 7/6. Pt admitteed with L sided weakness, R gaze perference, L homonymous hemianopsia. 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 MRI 8/28 showed new acute to subacute infarcts in the left BG and right cerebellar peduncle.    PT Comments    Pt required +2 min/mod assist transfers and +2 mod/HHA ambulation 150 feet. Moderate left lean in stance/gait. L inattention with R gaze preference. Pt more engaged this session. When told today was Monday and asked what tomorrow would be, he correctly answered "Tuesday." When asked what the next month is if today was August 31, he incorrectly answered "October." Pt positioned in recliner at end of session with feet elevated. Goals reviewed and updated as indicated.    Follow Up Recommendations  SNF;Supervision/Assistance - 24 hour     Equipment Recommendations  Rolling walker with 5" wheels;3in1 (PT);Wheelchair (measurements PT);Wheelchair cushion (measurements PT)    Recommendations for Other Services       Precautions / Restrictions Precautions Precautions: Fall;Other (comment) Precaution Comments: L lateral lean, L hemianopsia, L inattention, impulsive Restrictions Weight Bearing Restrictions: No    Mobility  Bed Mobility               General bed mobility comments: seatedon EOB finishing breakfast upon entering the room.  Transfers Overall transfer level: Needs assistance Equipment used: 2 person hand held assist Transfers: Sit to/from [facility] Sit to Stand: Min assist;+2 physical assistance Stand pivot transfers: +2 physical assistance;Mod assist       General transfer comment: cues to initiate transfer  Ambulation/Gait Ambulation/Gait assistance: Mod assist;+2 physical assistance Gait  Distance (Feet): 150 Feet Assistive device: 2 person hand held assist Gait Pattern/deviations: Step-through pattern;Scissoring;Narrow base of support Gait velocity: decreased Gait velocity interpretation: <1.31 ft/sec, indicative of household ambulator General Gait Details: moderate left lean. Scissoring approx 25% of time.   Stairs             Wheelchair Mobility    Modified Rankin (Stroke Patients Only) Modified Rankin (Stroke Patients Only) Pre-Morbid Rankin Score: No symptoms Modified Rankin: Moderately severe disability     Balance Overall balance assessment: Needs assistance Sitting-balance support: Feet supported;No upper extremity supported Sitting balance-[name]: Good Sitting balance - Comments: able to maintain EOB sitting with min guard assist  Postural control: Left lateral lean Standing balance support: Bilateral upper extremity supported Standing balance-[name]: Poor Standing balance comment: heavy lean to the L with standing and ambulation                            Cognition Arousal/Alertness: Awake/alert Behavior During Therapy: Flat affect Overall Cognitive Status: Impaired/Different from baseline Area of Impairment: Attention;Memory;Safety/judgement;Awareness;Following commands;Problem solving;Orientation                 Orientation Level: Disoriented to;Place;Time;Situation Current Attention Level: Sustained Memory: Decreased recall of precautions;Decreased short-term memory Following Commands: Follows one step commands with increased time;Follows one step commands inconsistently Safety/Judgement: Decreased awareness of safety;Decreased awareness of deficits Awareness: Intellectual Problem Solving: Slow processing;Decreased initiation;Difficulty sequencing;Requires verbal cues;Requires tactile cues General Comments: Pt more engaged in therapy as compared to previous session.      Exercises      General Comments  Pertinent Vitals/Pain Pain Assessment: Faces Faces Pain Scale: No hurt    Home Living                      Prior Function            PT Goals (current goals can now be found in the care plan section) Acute Rehab PT Goals Patient Stated Goal: did not state PT Goal Formulation: Patient unable to participate in goal setting Time For Goal Achievement: 05/19/19 Potential to Achieve Goals: Fair Progress towards PT goals: Progressing toward goals    Frequency    Min 3X/week      PT Plan Current plan remains appropriate    Co-evaluation PT/OT/SLP Co-Evaluation/Treatment: Yes Reason for Co-Treatment: Complexity of the patient's impairments (multi-system involvement);For patient/therapist safety;To address functional/ADL transfers;Necessary to address cognition/behavior during functional activity PT goals addressed during session: Mobility/safety with mobility;Balance OT goals addressed during session: ADL's and self-care      AM-PAC PT "6 Clicks" Mobility   Outcome Measure  Help needed turning from your back to your side while in a flat bed without using bedrails?: None Help needed moving from lying on your back to sitting on the side of a flat bed without using bedrails?: A Little Help needed moving to and from a bed to a chair (including a wheelchair)?: A Lot Help needed standing up from a chair using your arms (e.g., wheelchair or bedside chair)?: A Little Help needed to walk in hospital room?: A Lot Help needed climbing 3-5 steps with a railing? : A Lot 6 Click Score: 16    End of Session Equipment Utilized During Treatment: Gait belt Activity Tolerance: Patient tolerated treatment well Patient left: in chair;with chair alarm set;with call bell/phone within reach Nurse Communication: Mobility status PT Visit Diagnosis: Muscle weakness (generalized) (M62.81);Hemiplegia and hemiparesis;Other abnormalities of gait and mobility (R26.89) Hemiplegia -  Right/Left: Left Hemiplegia - dominant/non-dominant: Dominant Hemiplegia - caused by: Cerebral infarction     Time: 1016-1040 PT Time Calculation (min) (ACUTE ONLY): 24 min  Charges:  $Gait Training: 8-22 mins                     Lorrin Goodell, PT  Office # [phone] Pager [phone]    Lorriane Shire 05/05/2019, 12:49 PM

## 2019-05-05 NOTE — TOC Progression Note (Signed)
Transition of Care [facility]) - Progression Note    Patient Details  Name: Joe Perry MRN: [id number] Date of Birth: 15-Jun-1951  Transition of Care [facility]) CM/SW [place], [place] Phone Number: 05/05/2019, 4:10 PM  Clinical Narrative:   CSW following for discharge plan. CSW spoke with patient's wife, [name], over the phone to discuss any updates. [name] asked how the patient was doing, and CSW discussed how he did with therapies this morning. [name] asked if that means that he will be able to go to rehab, and CSW indicated that there is still no bed available for him. [name] discussed feeling frustrated and hopeless, that she sees him becoming more confused and weak and losing weight and it is starting to effect her own health worrying about him. [name] requested that CSW contact [name].  CSW spoke with [name] about any updates on the patient. [name] indicated that they are trying to find information for the patient's Medicaid application but they don't know where to find the information that the social worker needs. [name] says that he has left a Advertising account executive for the Education officer, museum at [employer] for assistance. CSW obtained information and will also reach out. [name] discussed how [name] is not going to be able to take the patient home, that they've talked about it and she would not be able to stay home forever and they are worried if the patient gets back home that he will want to do stuff that he is not safe to do, like driving or other things, and he will end up hurt. CSW discussed how patient is still requiring 2 people for ambulation and there are not 2 people available to stay with him at home to ensure his safety.   CSW to continue to follow. Patient remains on the difficult to place list.    Expected Discharge Plan: Skilled Nursing Facility Barriers to Discharge: Continued Medical Work up, SNF Pending Medicaid  Expected Discharge Plan and Services Expected Discharge Plan: [place] In-house Referral: Clinical Social Work Discharge Planning Services: NA Post Acute Care Choice: [place] Living arrangements for the past 2 months: Single Family Home                 DME Arranged: N/A DME Agency: NA     Representative spoke with at DME Agency: na HH Arranged: NA [place] Agency: NA         Social Determinants of Health (SDOH) Interventions    Readmission Risk Interventions No flowsheet data found.

## 2019-05-05 NOTE — Progress Notes (Signed)
Occupational Therapy Treatment Patient Details Name: JAYAN JOHNS MRN: [id number] DOB: 1951-04-23 Today's Date: 05/05/2019    History of present illness 68 yo male s/p  R PCOM and R PCA occlusion s/p emergent thrombectomy on 7/6. Pt admitteed with L sided weakness, R gaze perference, L homonymous hemianopsia. 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 MRI 8/28 showed new acute to subacute infarcts in the left BG and right cerebellar peduncle.   OT comments  Pt seen for skilled co -treat with PT. Pt seated on EOB and eating breakfast with supervision - min guard for balance. Pt unable to locate items past midline to the L on tray table. Pt required suction as he was orally holding liquids and unable to swallow with commands. Pt ambulating with min +2 HHA 100' + and L lateral lean. Pt tolerated session well and seated in recliner chair at end of session.   Follow Up Recommendations  SNF;Supervision/Assistance - 24 hour    Equipment Recommendations  None recommended by OT       Precautions / Restrictions Precautions Precautions: Fall;Other (comment) Precaution Comments: L lateral lean, L hemianopsia, L inattention, impulsive Restrictions Weight Bearing Restrictions: No       Mobility Bed Mobility     General bed mobility comments: seatedon EOB finishing breakfast upon entering the room.  Transfers Overall transfer level: Needs assistance   Transfers: Sit to/from Stand Sit to Stand: Min assist Stand pivot transfers: +2 physical assistance       General transfer comment: from elevated bed height    Balance Overall balance assessment: Needs assistance Sitting-balance support: Feet supported;No upper extremity supported Sitting balance-[name]: Good Sitting balance - Comments: able to maintain EOB sitting with min guard assist  Postural control: Left lateral lean   Standing balance-[name]: Poor Standing balance comment: heavy lean to the L with standing and  ambulation         ADL either performed or assessed with clinical judgement   ADL Overall ADL's : Needs assistance/impaired Eating/Feeding: Sitting;Minimal assistance Eating/Feeding Details (indicate cue type and reason): Pt able to feed self with utensil utilizing R UE but needing cuing to swallow,unable to locate items to the L >midline of tray. Pt needing suctioning secondary to oral holding of liquids.                    Cognition Arousal/Alertness: Awake/alert Behavior During Therapy: Flat affect Overall Cognitive Status: Impaired/Different from baseline Area of Impairment: Attention;Memory;Safety/judgement;Awareness;Following commands;Problem solving;Orientation        Orientation Level: Time;Situation Current Attention Level: Sustained Memory: Decreased recall of precautions;Decreased short-term memory Following Commands: Follows one step commands with increased time;Follows one step commands inconsistently Safety/Judgement: Decreased awareness of safety;Decreased awareness of deficits Awareness: Intellectual Problem Solving: Slow processing;Decreased initiation;Difficulty sequencing;Requires verbal cues;Requires tactile cues                     Pertinent Vitals/ Pain       Pain Assessment: Faces Faces Pain Scale: No hurt         Frequency  Min 2X/week        Progress Toward Goals  OT Goals(current goals can now be found in the care plan section)  Progress towards OT goals: Progressing toward goals  Acute Rehab OT Goals Patient Stated Goal: did not state OT Goal Formulation: Patient unable to participate in goal setting Time For Goal Achievement: 05/19/19 Potential to Achieve Goals: [place] Discharge plan remains appropriate  Co-evaluation    PT/OT/SLP Co-Evaluation/Treatment: Yes Reason for Co-Treatment: Complexity of the patient's impairments (multi-system involvement);For patient/therapist safety;To address functional/ADL  transfers;Necessary to address cognition/behavior during functional activity PT goals addressed during session: Mobility/safety with mobility;Balance OT goals addressed during session: ADL's and self-care      AM-PAC OT "6 Clicks" Daily Activity     Outcome Measure   Help from another person eating meals?: A Lot Help from another person taking care of personal grooming?: A Lot Help from another person toileting, which includes using toliet, bedpan, or urinal?: A Lot Help from another person bathing (including washing, rinsing, drying)?: A Lot Help from another person to put on and taking off regular upper body clothing?: A Lot Help from another person to put on and taking off regular lower body clothing?: A Lot 6 Click Score: 12    End of Session Equipment Utilized During Treatment: Gait belt  OT Visit Diagnosis: Cognitive communication deficit (R41.841);Unsteadiness on feet (R26.81) Symptoms and signs involving cognitive functions: Cerebral infarction   Activity Tolerance Patient tolerated treatment well   Patient Left in chair;with call bell/phone within reach;with chair alarm set   Nurse Communication Mobility status;Precautions        Time: [id number] OT Time Calculation (min): 23 min  Charges: OT General Charges $OT Visit: 1 Visit OT Treatments $Self Care/Home Management : 8-22 mins    Gypsy Decant 05/05/2019, 12:44 PM

## 2019-05-05 NOTE — Progress Notes (Signed)
STROKE TEAM PROGRESS NOTE   Interval history:  Patient is lying comfortably in bed.  He has no complaints.  Vital signs are stable.  Neurological exam is unchanged.  MRI brain 05/02/2019 shows evolving right PCA stroke and new bilateral acute to subacute infarcts in the left BG and right cerebellar peduncle. No afib on tele.   Vitals:   05/04/19 2347 05/05/19 0421 05/05/19 0737 05/05/19 1120  BP:  (!) 148/92 136/83 120/78  Pulse: 71 73 (!) 57 80  Resp: 18  12 20   Temp: 98.1 F (36.7 C)  97.6 F (36.4 C) 98.1 F (36.7 C)  TempSrc: Oral  Oral Oral  SpO2: 100% 100% 100% 100%  Weight:      Height:        CBC:  Recent Labs  Lab 04/30/19 0721 05/03/19 0749  WBC 4.4 4.2  HGB 12.2* 13.0  HCT 37.5* 40.5  MCV 94.9 96.9  PLT 264 [id number]    Basic Metabolic Panel:  Recent Labs  Lab 04/30/19 0721 05/03/19 0749  NA 141 145  K 3.3* 3.9  CL 105 108  CO2 26 26  GLUCOSE 90 99  BUN 14 17  CREATININE 0.96 0.88  CALCIUM 9.6 9.6   IMAGING past 24h No results found.    PHYSICAL EXAM      General - Well nourished, well developed, African male not in distress Ophthalmologic - fundi not visualized due to noncooperation. Lungs clear to auscultation Cardiovascular - Regular rate and rhythm.  Mental Status -  Awake and alert, oriented to self and place, speech is nonfluent and can barely speak a few words and nods appropriately.  Follows simple midline in 1 and occasional two-step commands.  Unable to name and repeat  Cranial Nerves II - XII - II - left dense homonymous hemianopia. III, IV, VI - Extraocular movements intact but right gaze preference with slight limitation of left lateral gaze V - Facial sensation intact bilaterally. VII - Facial movement intact bilaterally. VIII - Hearing & vestibular intact bilaterally. X - Palate elevates symmetrically. XI - Chin turning & shoulder shrug intact bilaterally. XII - Tongue protrusion intact.  Motor Strength - The patient's  strength was normal in right sided extremities and mild 4/5 weakness on the left.  Mild weakness of left grip. Bulk was normal and fasciculations were absent.    Mild action tremor of the left upper and lower extremity.  No definite choreiform movements noted possibly some pseudoathetosis of the left upper extremity when it is outstretched and eyes closed Motor Tone - Muscle tone was assessed at the neck and appendages and was normal.  Reflexes - [name] were symmetrical in all extremities and he had no pathological reflexes.  Sensory - Light touch, temperature/pinprick were assessed and were symmetrical.  Coordination impaired left finger-to-nose and knee to heel coordination.Gait and Station - not tested   ASSESSMENT/PLAN [name] is a 68 y.o. 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    Stroke:   R large PCA infarct s/p IR w/ TICI2b revascularization, secondary to large vessel disease . Severe chronic ishemic disease. Acute/subacute bilateral infarcts(MRI 05/02/2019) may be due to small vessel disease. Code Stroke CT head No acute abnormality. Old R frontal infarct.    CTA head & neck - R P1 occlusion. Small BA w/ high grade atheromatous narrowing. L ICA siphon stenosis.  CT perfusion 19 cc penumbra  Cerebral angio TICI  2b revascularization occluded R PCOM and P PCA. Underlying large vessel atherosclerosis   MRI Rt PCA infarct    CT repeat - 04/11/2019 - Evolving R PCA infarct. No progression. No hemorrhage  Repeat MRI w/w/o contrast new acute/subacute infarcts left BG and right cerebellar peduncle 05/02/2019   2D Echo EF 60 to 65%.   Continue tele monitoring, so far no afib found   HIV neg  LDL 101   HgbA1c 9.8  P2Y12 90  Lovenox 40 mg sq daily  for VTE prophylaxis  aspirin 81 mg daily and clopidogrel 75 mg daily prior to admission, put on aspirin 325 mg daily and clopidogrel 75 mg daily. However,  developed itchiness, plavix was discontinued. Now on ASA 81 and Brilinta 90 bid. Continue on discharge.  Therapy recommendations:  SNF  Disposition:  Pending  Hx stroke/TIA    11/2018 - presented for dizziness and unsteady, MRI showed right frontal and parietal small white matter infarcts. No LVO. Extensive SVD.  Put on DAPT.  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  MRI negative for acute infarct.  Old infarcts B BG, thalami, L cerebellum.   Left upper extremity involuntary movement  Likely due to right thalamic involvement of current stroke  Semi-involuntary during awake, absent during sleep  Not able to afford tetrabenazine  Put on abilify 5->10mg . However, pt developed vomiting episodes x 2 days. Improved after off abilify.  Hemichorea movement slowly improving, none seen today  ?? PD with left hand tremor diagnosed in [place] - wife not sure if he was on any medication before.   Cognitive decline, agitation and restless  CT head unchanged; EEG no seizure  Intermittent restless  Did not sleep last night as per RN  seroquel to 50 QHs and 25 q am  Continue aricept 5mg  daily - increase to 10mg  in one month (05/16/2019) if tolerated  Hypertension  Home meds:  None listed  Stable 130-140s  Lotensin 5mg  daily stopped 8/8   If BP in the 150s, resume lower dose lotensin . BP goal normotensive  UTI, resolved  U/A WBC > 50  IV Rocephin 8/9 x 3 doses - completed  UCx w/ mult morphocytoes, recollection x 2 w/ multiple species  Hyperlipidemia  Home meds:  No statin listed  LDL 101, goal < 70  AST and ALT normalized   Continue Lipitor 40  Continue statin on discharge  Diabetes type II, uncontrolled->stable  Home meds:  glucophage 1000 bid  HgbA1c 9.8, goal < 7.0  On metformin 1000mg  bid  from 7/11  CBGs  SSI  Dysphagia, resolved . 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 tele negative for afib.  Likely small-vessel strokes, incidental and have not caused any significant clinical change in his exam Continue ongoing  current care.  Patient on difficult to place list for nursing home due to lack of insurance and his age as per social worker A total of 25 minutes was spent for the care of this patient, spent on counseling patient and family on different diagnostic and therapeutic options, counseling and coordination of care, riskd ans benefits of management, compliance, or risk factor reduction and education. Antony Contras, MD Stroke Neurology      To contact Stroke Continuity provider, please refer to [email or web address] After hours, contact General Neurology

## 2019-05-06 DIAGNOSIS — G255 Other chorea: Secondary | ICD-10-CM

## 2019-05-06 LAB — GLUCOSE, CAPILLARY
Glucose-Capillary: 95 mg/dL (ref 70–99)
Glucose-Capillary: 79 mg/dL (ref 70–99)
Glucose-Capillary: 79 mg/dL (ref 70–99)
Glucose-Capillary: 144 mg/dL — ABNORMAL HIGH (ref 70–99)

## 2019-05-06 NOTE — Progress Notes (Signed)
STROKE TEAM PROGRESS NOTE   Interval history:  Patient is lying comfortably in bed.  He has no complaints.  Vital signs are stable.  Neurological exam is unchanged.   Social worker still trying to find a place for rehab but he is difficult placement.  No family at the bedside.  Vitals:   05/05/19 2344 05/06/19 0332 05/06/19 0828 05/06/19 1211  BP: 134/74 134/75 107/87 135/80  Pulse: (!) 52 72 87 73  Resp: 16 18 18 18   Temp: (!) 97.5 F (36.4 C) 97.7 F (36.5 C) 97.6 F (36.4 C) (!) 97.5 F (36.4 C)  TempSrc: Axillary Oral Oral Oral  SpO2: 100% 100% 99% 99%  Weight:      Height:        CBC:  Recent Labs  Lab 04/30/19 0721 05/03/19 0749  WBC 4.4 4.2  HGB 12.2* 13.0  HCT 37.5* 40.5  MCV 94.9 96.9  PLT 264 [id number]    Basic Metabolic Panel:  Recent Labs  Lab 04/30/19 0721 05/03/19 0749  NA 141 145  K 3.3* 3.9  CL 105 108  CO2 26 26  GLUCOSE 90 99  BUN 14 17  CREATININE 0.96 0.88  CALCIUM 9.6 9.6   IMAGING past 24h No results found.    PHYSICAL EXAM      General - Well nourished, well developed, African male not in distress Ophthalmologic - fundi not visualized due to noncooperation. Lungs clear to auscultation Cardiovascular - Regular rate and rhythm.  Mental Status -  Awake and alert, oriented to self and place, speech is nonfluent and can barely speak a few words and nods appropriately.  Follows simple midline in 1 and occasional two-step commands.  Unable to name and repeat  Cranial Nerves II - XII - II - left dense homonymous hemianopia. III, IV, VI - Extraocular movements intact but right gaze preference with slight limitation of left lateral gaze V - Facial sensation intact bilaterally. VII - Facial movement intact bilaterally. VIII - Hearing & vestibular intact bilaterally. X - Palate elevates symmetrically. XI - Chin turning & shoulder shrug intact bilaterally. XII - Tongue protrusion intact.  Motor Strength - [name] was normal  in right sided extremities and mild 4/5 weakness on the left.  Mild weakness of left grip. Bulk was normal and fasciculations were absent.    Mild action tremor of the left upper and lower extremity.  No definite choreiform movements noted possibly some pseudoathetosis of the left upper extremity when it is outstretched and eyes closed Motor Tone - Muscle tone was assessed at the neck and appendages and was normal.  Reflexes - [name] were symmetrical in all extremities and he had no pathological reflexes.  Sensory - Light touch, temperature/pinprick were assessed and were symmetrical.  Coordination impaired left finger-to-nose and knee to heel coordination.Gait and Station - not tested   ASSESSMENT/PLAN [name] is a 68 y.o. 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    Stroke:   R large PCA infarct s/p IR w/ TICI2b revascularization, secondary to large vessel disease . Severe chronic ishemic disease. Acute/subacute bilateral infarcts(MRI 05/02/2019) may be due to small vessel disease. Code Stroke CT head No acute abnormality. Old R frontal infarct.    CTA head & neck - R P1 occlusion. Small BA w/ high grade atheromatous narrowing. L ICA siphon stenosis.  CT perfusion 19 cc penumbra  Cerebral angio TICI 2b revascularization  occluded R PCOM and P PCA. Underlying large vessel atherosclerosis   MRI Rt PCA infarct    CT repeat - 04/11/2019 - Evolving R PCA infarct. No progression. No hemorrhage  Repeat MRI w/w/o contrast new acute/subacute infarcts left BG and right cerebellar peduncle 05/02/2019   2D Echo EF 60 to 65%.   Continue tele monitoring, so far no afib found   HIV neg  LDL 101   HgbA1c 9.8  P2Y12 90  Lovenox 40 mg sq daily  for VTE prophylaxis  aspirin 81 mg daily and clopidogrel 75 mg daily prior to admission, put on aspirin 325 mg daily and clopidogrel 75 mg daily. However, developed itchiness,  plavix was discontinued. Now on ASA 81 and Brilinta 90 bid. Continue on discharge.  Therapy recommendations:  SNF  Disposition:  Pending  Hx stroke/TIA    11/2018 - presented for dizziness and unsteady, MRI showed right frontal and parietal small white matter infarcts. No LVO. Extensive SVD.  Put on DAPT.  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  MRI negative for acute infarct.  Old infarcts B BG, thalami, L cerebellum.   Left upper extremity involuntary movement  Likely due to right thalamic involvement of current stroke  Semi-involuntary during awake, absent during sleep  Not able to afford tetrabenazine  Put on abilify 5->10mg . However, pt developed vomiting episodes x 2 days. Improved after off abilify.  Hemichorea movement slowly improving, none seen today  ?? PD with left hand tremor diagnosed in [place] - wife not sure if he was on any medication before.   Cognitive decline, agitation and restless  CT head unchanged; EEG no seizure  Intermittent restless  Did not sleep last night as per RN  seroquel to 50 QHs and 25 q am  Continue aricept 5mg  daily - increase to 10mg  in one month (05/16/2019) if tolerated  Hypertension  Home meds:  None listed  Stable 130-140s  Lotensin 5mg  daily stopped 8/8   If BP in the 150s, resume lower dose lotensin . BP goal normotensive  UTI, resolved  U/A WBC > 50  IV Rocephin 8/9 x 3 doses - completed  UCx w/ mult morphocytoes, recollection x 2 w/ multiple species  Hyperlipidemia  Home meds:  No statin listed  LDL 101, goal < 70  AST and ALT normalized   Continue Lipitor 40  Continue statin on discharge  Diabetes type II, uncontrolled->stable  Home meds:  glucophage 1000 bid  HgbA1c 9.8, goal < 7.0  On metformin 1000mg  bid from  7/11  CBGs  SSI  Dysphagia, resolved . 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 tele negative for afib.  Likely small-vessel strokes, incidental and have not caused any significant clinical change in his exam Continue ongoing current care.  Patient on difficult to place list for nursing home due to lack of insurance and his age as per social worker  Antony Contras, MD Stroke Neurology      To contact Stroke Continuity provider, please refer to [email or web address] After hours, contact General Neurology

## 2019-05-06 NOTE — Progress Notes (Signed)
Physical Therapy Treatment Patient Details Name: Joe Perry MRN: [id number] DOB: 29-Oct-1950 Today's Date: 05/06/2019    History of Present Illness 68 yo male s/p  R PCOM and R PCA occlusion s/p emergent thrombectomy on 7/6. Pt admitteed with L sided weakness, R gaze perference, L homonymous hemianopsia. 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 MRI 8/28 showed new acute to subacute infarcts in the left BG and right cerebellar peduncle.    PT Comments    Patient continues to present with impaired cognition and requires min/mod A +2 for OOB mobility. Continue to progress as tolerated with anticipated d/c to SNF for further skilled PT services.     Follow Up Recommendations  SNF;Supervision/Assistance - 24 hour     Equipment Recommendations  Rolling walker with 5" wheels;3in1 (PT);Wheelchair (measurements PT);Wheelchair cushion (measurements PT)    Recommendations for Other Services       Precautions / Restrictions Precautions Precautions: Fall;Other (comment) Precaution Comments: L lateral lean, L hemianopsia, L inattention, impulsive Restrictions Weight Bearing Restrictions: No    Mobility  Bed Mobility Overal bed mobility: Needs Assistance Bed Mobility: Supine to Sit     Supine to sit: Min assist;HOB elevated     General bed mobility comments: assist to bring bilat LE to EOB and to elevate trunk into upright sitting; cues for sequencing  Transfers Overall transfer level: Needs assistance Equipment used: Rolling walker (2 wheeled) Transfers: Sit to/from [facility] Sit to Stand: Min assist;+2 physical assistance;Mod assist Stand pivot transfers: +2 physical assistance;Mod assist       General transfer comment: multimodal cues for safe hand plaement and sequencing of steps especially when turning and stepping backwards to sit; assist to needed to power up into standing and for balance; pt stood from EOB several times; initial stand pt began having BM  and then stood for hygiene and transfered to [facility] then stood from [facility] before ambulating to recliner   Ambulation/Gait Ambulation/Gait assistance: Mod assist;+2 physical assistance Gait Distance (Feet): 6 Feet Assistive device: Rolling walker (2 wheeled) Gait Pattern/deviations: Step-through pattern;Decreased stride length;Narrow base of support Gait velocity: decreased   General Gait Details: assistance for balance and guiding RW; multimodal cues for seuqencing of steps   Stairs             Wheelchair Mobility    Modified Rankin (Stroke Patients Only) Modified Rankin (Stroke Patients Only) Pre-Morbid Rankin Score: No symptoms Modified Rankin: Moderately severe disability     Balance Overall balance assessment: Needs assistance Sitting-balance support: Feet supported;No upper extremity supported Sitting balance-[name]: Good Sitting balance - Comments: able to maintain EOB sitting with min guard assist  Postural control: Posterior lean;Left lateral lean Standing balance support: Bilateral upper extremity supported Standing balance-[name]: Poor Standing balance comment: L lateral lean                            Cognition Arousal/Alertness: Awake/alert Behavior During Therapy: Flat affect;Impulsive Overall Cognitive Status: Impaired/Different from baseline Area of Impairment: Attention;Memory;Safety/judgement;Awareness;Following commands;Problem solving;Orientation                 Orientation Level: Disoriented to;Time;Situation(able to state place with cues) Current Attention Level: Sustained Memory: Decreased recall of precautions;Decreased short-term memory Following Commands: Follows one step commands with increased time;Follows one step commands inconsistently Safety/Judgement: Decreased awareness of safety;Decreased awareness of deficits Awareness: Intellectual Problem Solving: Slow processing;Difficulty sequencing;Requires verbal  cues;Requires tactile cues  Exercises      General Comments        Pertinent Vitals/Pain Pain Assessment: No/denies pain    Home Living                      Prior Function            PT Goals (current goals can now be found in the care plan section) Progress towards PT goals: Progressing toward goals    Frequency    Min 3X/week      PT Plan Current plan remains appropriate    Co-evaluation              AM-PAC PT "6 Clicks" Mobility   Outcome Measure  Help needed turning from your back to your side while in a flat bed without using bedrails?: None Help needed moving from lying on your back to sitting on the side of a flat bed without using bedrails?: A Little Help needed moving to and from a bed to a chair (including a wheelchair)?: A Lot Help needed standing up from a chair using your arms (e.g., wheelchair or bedside chair)?: A Little Help needed to walk in hospital room?: A Lot Help needed climbing 3-5 steps with a railing? : A Lot 6 Click Score: 16    End of Session Equipment Utilized During Treatment: Gait belt Activity Tolerance: Patient tolerated treatment well Patient left: in chair;with chair alarm set;with call bell/phone within reach;with family/visitor present Nurse Communication: Mobility status PT Visit Diagnosis: Muscle weakness (generalized) (M62.81);Hemiplegia and hemiparesis;Other abnormalities of gait and mobility (R26.89) Hemiplegia - Right/Left: Left Hemiplegia - dominant/non-dominant: Dominant Hemiplegia - caused by: Cerebral infarction     Time: [id number] PT Time Calculation (min) (ACUTE ONLY): 49 min  Charges:  $Gait Training: 23-37 mins $Therapeutic Activity: 8-22 mins                     Earney Navy, PTA Acute Rehabilitation Services Pager: [phone] Office: [phone]     Darliss Cheney 05/06/2019, 5:26 PM

## 2019-05-06 NOTE — Progress Notes (Signed)
Family meeting with patient's spouse at the bedside, friend [name] [phone]) via speaker phone, Lissa Morales concerning DCP;  Patient is on the Difficult to Place List for SNF, no bed offers at this time; Medicaid application pending; spouse works at [employer] as a Psychologist, counselling and she stated that she cannot take care of the patient at home alone. She requested that a Letter be sent to his son in [place] requesting him to come to the [place] to help take care of him at home. She plans to visit the hospital tomorrow with the son's information. Very difficult case. CM /SW will continue to follow for DCP; B Pennie Rushing Advanced Care Supervisor

## 2019-05-07 LAB — CBC
HCT: 42.4 % (ref 39.0–52.0)
Hemoglobin: 13.6 g/dL (ref 13.0–17.0)
MCH: 30.4 pg (ref 26.0–34.0)
MCHC: 32.1 g/dL (ref 30.0–36.0)
MCV: 94.6 fL (ref 80.0–100.0)
Platelets: 279 10*3/uL (ref 150–400)
RBC: 4.48 MIL/uL (ref 4.22–5.81)
RDW: 13.2 % (ref 11.5–15.5)
WBC: 4.3 10*3/uL (ref 4.0–10.5)
nRBC: 0 % (ref 0.0–0.2)

## 2019-05-07 LAB — GLUCOSE, CAPILLARY
Glucose-Capillary: 104 mg/dL — ABNORMAL HIGH (ref 70–99)
Glucose-Capillary: 100 mg/dL — ABNORMAL HIGH (ref 70–99)
Glucose-Capillary: 132 mg/dL — ABNORMAL HIGH (ref 70–99)
Glucose-Capillary: 68 mg/dL — ABNORMAL LOW (ref 70–99)
Glucose-Capillary: 93 mg/dL (ref 70–99)

## 2019-05-07 LAB — BASIC METABOLIC PANEL
Anion gap: 12 (ref 5–15)
BUN: 14 mg/dL (ref 8–23)
CO2: 25 mmol/L (ref 22–32)
Calcium: 9.8 mg/dL (ref 8.9–10.3)
Chloride: 106 mmol/L (ref 98–111)
Creatinine, Ser: 0.81 mg/dL (ref 0.61–1.24)
GFR calc Af Amer: >60 mL/min (ref >60)
GFR calc non Af Amer: >60 mL/min (ref >60)
Glucose, Bld: 144 mg/dL — ABNORMAL HIGH (ref 70–99)
Potassium: 3.7 mmol/L (ref 3.5–5.1)
Sodium: 143 mmol/L (ref 135–145)

## 2019-05-07 NOTE — Progress Notes (Signed)
STROKE TEAM PROGRESS NOTE   Interval history:  Patient is lying comfortably in bed.  He has no complaints.  Vital signs are stable.  Neurological exam is unchanged.  No changes  Vitals:   05/07/19 0414 05/07/19 0721 05/07/19 1122 05/07/19 1607  BP: (!) 138/95 (!) 149/111 137/71 132/71  Pulse: 80 (!) 49 (!) 54 61  Resp: 19 16 16 16   Temp: 97.9 F (36.6 C) (!) 97.5 F (36.4 C) (!) 97.5 F (36.4 C) 97.9 F (36.6 C)  TempSrc:  Oral Oral Oral  SpO2:    97%  Weight:      Height:        CBC:  Recent Labs  Lab 05/03/19 0749 05/07/19 1020  WBC 4.2 4.3  HGB 13.0 13.6  HCT 40.5 42.4  MCV 96.9 94.6  PLT 247 [id number]    Basic Metabolic Panel:  Recent Labs  Lab 05/03/19 0749 05/07/19 1020  NA 145 143  K 3.9 3.7  CL 108 106  CO2 26 25  GLUCOSE 99 144*  BUN 17 14  CREATININE 0.88 0.81  CALCIUM 9.6 9.8   IMAGING past 24h No results found.    PHYSICAL EXAM      General - Well nourished, well developed, African male not in distress Ophthalmologic - fundi not visualized due to noncooperation. Lungs clear to auscultation Cardiovascular - Regular rate and rhythm.  Mental Status -  Awake and alert, oriented to self and place, speech is nonfluent and can barely speak a few words and nods appropriately.  Follows simple midline in 1 and occasional two-step commands.  Unable to name and repeat  Cranial Nerves II - XII - II - left dense homonymous hemianopia. III, IV, VI - Extraocular movements intact but right gaze preference with slight limitation of left lateral gaze V - Facial sensation intact bilaterally. VII - Facial movement intact bilaterally. VIII - Hearing & vestibular intact bilaterally. X - Palate elevates symmetrically. XI - Chin turning & shoulder shrug intact bilaterally. XII - Tongue protrusion intact.  Motor Strength - [name] was normal in right sided extremities and mild 4/5 weakness on the left.  Mild weakness of left grip. Bulk was normal  and fasciculations were absent.    Mild action tremor of the left upper and lower extremity.  No definite choreiform movements noted possibly some pseudoathetosis of the left upper extremity when it is outstretched and eyes closed Motor Tone - Muscle tone was assessed at the neck and appendages and was normal.  Reflexes - [name] were symmetrical in all extremities and he had no pathological reflexes.  Sensory - Light touch, temperature/pinprick were assessed and were symmetrical.  Coordination impaired left finger-to-nose and knee to heel coordination.Gait and Station - not tested   ASSESSMENT/PLAN [name] is a 68 y.o. 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    Stroke:   R large PCA infarct s/p IR w/ TICI2b revascularization, secondary to large vessel disease . Severe chronic ishemic disease. Acute/subacute bilateral infarcts(MRI 05/02/2019) may be due to small vessel disease. Code Stroke CT head No acute abnormality. Old R frontal infarct.    CTA head & neck - R P1 occlusion. Small BA w/ high grade atheromatous narrowing. L ICA siphon stenosis.  CT perfusion 19 cc penumbra  Cerebral angio TICI 2b revascularization occluded R PCOM and P PCA. Underlying large vessel atherosclerosis   MRI Rt PCA infarct  CT repeat - 04/11/2019 - Evolving R PCA infarct. No progression. No hemorrhage  Repeat MRI w/w/o contrast new acute/subacute infarcts left BG and right cerebellar peduncle 05/02/2019   2D Echo EF 60 to 65%.   Continue tele monitoring, so far no afib found   HIV neg  LDL 101   HgbA1c 9.8  P2Y12 90  Lovenox 40 mg sq daily  for VTE prophylaxis  aspirin 81 mg daily and clopidogrel 75 mg daily prior to admission, put on aspirin 325 mg daily and clopidogrel 75 mg daily. However, developed itchiness, plavix was discontinued. Now on ASA 81 and Brilinta 90 bid. Continue on discharge.  Therapy  recommendations:  SNF  Disposition:  Pending  Hx stroke/TIA    11/2018 - presented for dizziness and unsteady, MRI showed right frontal and parietal small white matter infarcts. No LVO. Extensive SVD.  Put on DAPT.  Patient signed out [facility]  05/02/2019: Repeat MRI w/w/o contrast new acute/subacute infarcts left BG and right cerebellar peduncle 05/02/2019, 4-33mm vestibular schwannoma  01/2016 - admitted for gait disability.  MRI negative for acute infarct.  Old infarcts B BG, thalami, L cerebellum.   Left upper extremity involuntary movement  Likely due to right thalamic involvement of current stroke  Semi-involuntary during awake, absent during sleep  Not able to afford tetrabenazine  Put on abilify 5->10mg . However, pt developed vomiting episodes x 2 days. Improved after off abilify.  Hemichorea movement slowly improving, none seen today  ?? PD with left hand tremor diagnosed in [place] - wife not sure if he was on any medication before.   Cognitive decline, agitation and restless  CT head unchanged; EEG no seizure  Intermittent restless  Did not sleep last night as per RN  seroquel to 50 QHs and 25 q am  Continue aricept 5mg  daily - increase to 10mg  in one month (05/16/2019) if tolerated  Hypertension  Home meds:  None listed  Stable 130-140s  Lotensin 5mg  daily stopped 8/8   If BP in the 150s, resume lower dose lotensin . BP goal normotensive  UTI, resolved  U/A WBC > 50  IV Rocephin 8/9 x 3 doses - completed  UCx w/ mult morphocytoes, recollection x 2 w/ multiple species  Hyperlipidemia  Home meds:  No statin listed  LDL 101, goal < 70  AST and ALT normalized   Continue Lipitor 40  Continue statin on discharge  Diabetes type II, uncontrolled->stable  Home meds:  glucophage 1000 bid  HgbA1c 9.8, goal < 7.0  On metformin 1000mg  bid from 7/11  CBGs  SSI  Dysphagia, resolved . On D1 thin liquids, meds in puree  Other Stroke Risk  Factors  Advanced age  Overweight, Body mass index is 29.3 kg/m., recommend weight loss, diet and exercise as appropriate   No driving due to hemianopia, discussed with wife in the past  Other Active Problems  Constipation, abd xray ok, put on senokot, prn bisacodyl and fleets  N/V - resolved  Chronic stuttering - as per wife  Hypokalemia 3.3 - resolved  4-34mm vestibular schwannoma, needs outpatient follow up   Hospital day # 58    Repeat MRI w/w/o contrast new acute/subacute infarcts left BG and right cerebellar peduncle 05/02/2019, 4-46mm vestibular schwannoma. tele negative for afib.  Likely small-vessel strokes, incidental and have not caused any significant clinical change in his exam Continue ongoing current care.  Patient on difficult to place list for nursing home due to lack of insurance and his age as  per social worker  Antony Contras, MD Stroke Neurology      To contact Stroke Continuity provider, please refer to [email or web address] After hours, contact General Neurology

## 2019-05-07 NOTE — Progress Notes (Signed)
Occupational Therapy Treatment Patient Details Name: Joe Perry MRN: [id number] DOB: November 21, 1950 Today's Date: 05/07/2019    History of present illness 68 yo male s/p  R PCOM and R PCA occlusion s/p emergent thrombectomy on 7/6. Pt admitteed with L sided weakness, R gaze perference, L homonymous hemianopsia. 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 MRI 8/28 showed new acute to subacute infarcts in the left BG and right cerebellar peduncle.   OT comments  Skilled OT session focused on visual scanning, self care, and midline orientation. OT holding Suction toothbrush in L visual field and pt able to head turn to locate and encouragement to reach across midline to obtain. Pt initially attempting to brush with L UE but having difficulty and switched to R UE for functional task. However, pt needing cuing secondary to perseveration on task. OT hanging pt lotion but he attempted to put in mouth. OT squeezing lotion onto L hand and using hand over hand to place together. Pt then utilizing B UEs to lotion hands and arms with increased time. OT donning B hand mitts again with call bell within reach. Pt continues to benefit from OT intervention.   Follow Up Recommendations  SNF;Supervision/Assistance - 24 hour    Equipment Recommendations  (defer to next venue of care)    Recommendations for Other Services      Precautions / Restrictions Precautions Precautions: Fall;Other (comment) Precaution Comments: L lateral lean, L hemianopsia, L inattention, impulsive Restrictions Weight Bearing Restrictions: No       Mobility Bed Mobility      General bed mobility comments: seated in recliner chair     Balance Overall balance assessment: Needs assistance Sitting-balance support: Feet supported;No upper extremity supported Sitting balance-[name]: Good Sitting balance - Comments: able to remain upright in recliner chair without cuing for midline orientation          ADL either performed  or assessed with clinical judgement   ADL   Eating/Feeding: Sitting   Grooming: Wash/dry hands;Oral care;Set up;Sitting                    Cognition Arousal/Alertness: Awake/alert Behavior During Therapy: Flat affect;Impulsive Overall Cognitive Status: Impaired/Different from baseline Area of Impairment: Attention;Memory;Safety/judgement;Awareness;Following commands;Problem solving;Orientation        Orientation Level: Disoriented to;Time;Situation Current Attention Level: Sustained Memory: Decreased recall of precautions;Decreased short-term memory Following Commands: Follows one step commands with increased time;Follows one step commands inconsistently Safety/Judgement: Decreased awareness of safety;Decreased awareness of deficits Awareness: Intellectual Problem Solving: Slow processing;Difficulty sequencing;Requires verbal cues;Requires tactile cues                     Pertinent Vitals/ Pain       Pain Assessment: Faces Faces Pain Scale: No hurt         Frequency  Min 2X/week        Progress Toward Goals  OT Goals(current goals can now be found in the care plan section)  Progress towards OT goals: Progressing toward goals  Acute Rehab OT Goals Patient Stated Goal: did not state OT Goal Formulation: Patient unable to participate in goal setting Time For Goal Achievement: 05/07/19 Potential to Achieve Goals: [place] Discharge plan remains appropriate       AM-PAC OT "6 Clicks" Daily Activity     Outcome Measure   Help from another person eating meals?: A Lot Help from another person taking care of personal grooming?: A Lot Help from another person  toileting, which includes using toliet, bedpan, or urinal?: A Lot Help from another person bathing (including washing, rinsing, drying)?: A Lot Help from another person to put on and taking off regular upper body clothing?: A Lot Help from another person to put on and taking off regular lower body  clothing?: A Lot 6 Click Score: 12    End of Session    OT Visit Diagnosis: Cognitive communication deficit (R41.841);Unsteadiness on feet (R26.81) Symptoms and signs involving cognitive functions: Cerebral infarction   Activity Tolerance Patient tolerated treatment well   Patient Left in chair;with call bell/phone within reach;with chair alarm set   Nurse Communication Mobility status;Precautions        Time: 1003-1016 OT Time Calculation (min): 13 min  Charges: OT General Charges $OT Visit: 1 Visit OT Treatments $Self Care/Home Management : 8-22 mins   Darleen Crocker P,MS, OTR/L 05/07/2019, 12:32 PM

## 2019-05-07 NOTE — Progress Notes (Signed)
  Speech Language Pathology Treatment: Cognitive-Linquistic  Patient Details Name: Joe Perry MRN: [id number] DOB: 01-27-1951 Today's Date: 05/07/2019 Time: [id number] SLP Time Calculation (min) (ACUTE ONLY): 27 min  Assessment / Plan / Recommendation Clinical Impression  Pt was seen for treatment and was cooperative throughout the session. Verbal output was improved compared that which was noted during the last session and perseveration was slightly reduced. Pt was re-oriented to time and place at the beginning of the session with use of visual calendar and was able to accurately recall the year at the end of the session. At the end of the session when the pt was asked if he had any questions, he stated, "I want to be mastering my movement and and know exactly what I'm thinking" which is notably improved with regards to his communicative intent. When asked if this was a goal of his, he agreed. Pt demonstrated 75% accuracy with confrontational naming of photos when semantic and phonemic cues were given. He achieved 50% accuracy with sentence completion increasing to 67% with phonemic cues. He responded to moderately complex yes/no questions with 80% accuracy increasing to 90% with rephrasing. SLP will continue to follow pt.    HPI HPI: 68 year old male admitted 03/10/2019 with left weakness, unsteady gait, right gaze, left hemianopsia. PMH: CVA, HTN, DM. CT negative. MRI Ischemic infarct of the right posterior cerebral artery, territory, roughly corresponding to the ischemic volume.      SLP Plan  Continue with current plan of care       Recommendations                   Oral Care Recommendations: Oral care BID Follow up Recommendations: Skilled Nursing facility SLP Visit Diagnosis: Cognitive communication deficit (R41.841);Aphasia (R47.01);Dysphagia, unspecified (R13.10) Plan: Continue with current plan of care       Schwanda Zima I. Hardin Negus, [place], [place] Office number [phone] Pager [place] 05/07/2019, 9:48 AM

## 2019-05-08 LAB — GLUCOSE, CAPILLARY
Glucose-Capillary: 93 mg/dL (ref 70–99)
Glucose-Capillary: 116 mg/dL — ABNORMAL HIGH (ref 70–99)
Glucose-Capillary: 126 mg/dL — ABNORMAL HIGH (ref 70–99)
Glucose-Capillary: 106 mg/dL — ABNORMAL HIGH (ref 70–99)

## 2019-05-08 NOTE — Progress Notes (Signed)
STROKE TEAM PROGRESS NOTE   Interval history:  Pt lying in bed, initially sleepy but easily arousable. No neuro changes. Still not orientated to age or time.    Vitals:   05/11/19 2039 05/12/19 0034 05/12/19 0413 05/12/19 1000  BP: 117/83 109/63 111/72 133/76  Pulse: 98 84 86 60  Resp: 19 18 18 18   Temp: (!) 97.5 F (36.4 C) 98.1 F (36.7 C) 98 F (36.7 C)   TempSrc: Oral Oral Oral   SpO2: 97% 99% 100%   Weight:      Height:        CBC:  Recent Labs  Lab 05/07/19 1020 05/10/19 0752  WBC 4.3 4.3  HGB 13.6 13.2  HCT 42.4 40.5  MCV 94.6 94.2  PLT 279 [id number]    Basic Metabolic Panel:  Recent Labs  Lab 05/07/19 1020 05/10/19 0752  NA 143 142  K 3.7 4.0  CL 106 107  CO2 25 25  GLUCOSE 144* 87  BUN 14 12  CREATININE 0.81 0.67  CALCIUM 9.8 9.7   IMAGING past 24h No results found.    PHYSICAL EXAM  General- Well nourished, well developed, African male not in distress Ophthalmologic- fundi not visualized due to noncooperation. Lungs clear to auscultation Cardiovascular - Regular rate and rhythm.  Mental Status-  Awake and alert, oriented to self and place, speech is nonfluent and can barely speak a few words and nods appropriately.  Follows simple midline in 1 and occasional two-step commands.  Unable to name and repeat  Cranial Nerves II - XII- II - left dense homonymous hemianopia. III, IV, VI - Extraocular movements intact but right gaze preference with slight limitation of left lateral gaze V - Facial sensation intact bilaterally. VII - Facial movement intact bilaterally. VIII - Hearing & vestibular intact bilaterally. X - Palate elevates symmetrically. XI - Chin turning & shoulder shrug intact bilaterally. XII - Tongue protrusion intact.  Motor Strength -[name] was normal in right sided extremities and mild 4/5 weakness on the left.  Mild weakness of left grip.Bulk was normal and fasciculations were absent.   Motor Tone- Muscle  tone was assessed at the neck and appendages and was normal.  Reflexes-[name] were symmetrical in all extremities andhehad no pathological reflexes.  Sensory-Light touch, temperature/pinprick were assessed and were symmetrical. Coordination impaired left finger-to-nose and knee to heel coordination. Left hemichorea movement while awake, resolved on sleeping  Gait and Station-not tested   ASSESSMENT/PLAN [name] is a 68 y.o. 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   Stroke:   R large PCA infarct s/p IR w/ TICI2b revascularization, secondary to large vessel disease .  Stroke: inhospital Acute/subacute bilateral infarcts (MRI 05/02/2019) due to small vessel disease.   Code Stroke CT head No acute abnormality. Old R frontal infarct.    CTA head & neck - R P1 occlusion. Small BA w/ high grade atheromatous narrowing. L ICA siphon stenosis.  CT perfusion 19 cc penumbra  Cerebral angio TICI 2b revascularization occluded R PCOM and P PCA. Underlying large vessel atherosclerosis   MRI Rt PCA infarct    CT repeat - 04/11/2019 - Evolving R PCA infarct. No progression. No hemorrhage  Repeat MRI w/w/o contrast new acute/subacute infarcts left BG and right cerebellar peduncle 05/02/2019   2D Echo EF 60 to 65%.   Continue tele monitoring, so far no afib found   HIV neg  LDL  101   HgbA1c 9.8  P2Y12 90  Lovenox 40 mg sq daily  for VTE prophylaxis  aspirin 81 mg daily and clopidogrel 75 mg daily prior to admission, put on aspirin 325 mg daily and clopidogrel 75 mg daily. However, developed itchiness, plavix was discontinued. Now on ASA 81 and Brilinta 90 bid. Continue on discharge.  Therapy recommendations:  SNF  Disposition:  Pending  Hx stroke/TIA   01/2016 - admitted for gait disability.  MRI negative for acute infarct.  Old infarcts B BG, thalami, L cerebellum.   11/2018 - presented for  dizziness and unsteady, MRI showed right frontal and parietal small white matter infarcts. No LVO. Extensive SVD.  Put on DAPT.  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 However, pt developed vomiting episodes x 2 days. Improved after off abilify.  Hemichorea movement slowly improving, none seen today  ?? PD with left hand tremor diagnosed in [place] - wife not sure if he was on any medication before.   Cognitive decline, agitation and restless  CT head unchanged; EEG no seizure  Intermittent restless  Did not sleep last night as per RN  seroquel to 50 QHs and 25 q am  Continue aricept 5mg  daily - increase to 10mg  in one month (05/16/2019) if tolerated  Hypertension  Home meds:  None listed  Stable 130s  Lotensin 5mg  daily stopped 8/8  . If BP in the 150s, resume lower dose lotensin . BP goal normotensive  UTI, resolved  U/A WBC > 50  IV Rocephin 8/9 x 3 doses - completed  UCx w/ mult morphocytoes, recollection x 2 w/ multiple species  Hyperlipidemia  Home meds:  No statin listed  LDL 101, goal < 70  AST and ALT normalized   Continue Lipitor 40  Continue statin on discharge  Diabetes type II, uncontrolled->stable  Home meds:  glucophage 1000 bid  HgbA1c 9.8, goal < 7.0  On metformin 1000mg  bid from 7/11  CBGs  SSI  Dysphagia, resolved . 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 After hours, contact General Neurology

## 2019-05-08 NOTE — Progress Notes (Signed)
STROKE TEAM PROGRESS NOTE   Interval history:  Patient is lying comfortably in bed.  He has no complaints.  Vital signs are stable.  Neurological exam is unchanged.  No changes  Vitals:   05/08/19 0000 05/08/19 0400 05/08/19 0854 05/08/19 1241  BP: 132/68 138/90 128/77 127/72  Pulse: 68 90 60 75  Resp: 16 18 14 16   Temp: 97.8 F (36.6 C) 97.6 F (36.4 C) 98.1 F (36.7 C) 98.4 F (36.9 C)  TempSrc: Axillary Oral Oral Oral  SpO2: 95% 100% 96% 97%  Weight:      Height:        CBC:  Recent Labs  Lab 05/03/19 0749 05/07/19 1020  WBC 4.2 4.3  HGB 13.0 13.6  HCT 40.5 42.4  MCV 96.9 94.6  PLT 247 [id number]    Basic Metabolic Panel:  Recent Labs  Lab 05/03/19 0749 05/07/19 1020  NA 145 143  K 3.9 3.7  CL 108 106  CO2 26 25  GLUCOSE 99 144*  BUN 17 14  CREATININE 0.88 0.81  CALCIUM 9.6 9.8   IMAGING past 24h No results found.    PHYSICAL EXAM      General - Well nourished, well developed, African male not in distress Ophthalmologic - fundi not visualized due to noncooperation. Lungs clear to auscultation Cardiovascular - Regular rate and rhythm.  Mental Status -  Awake and alert, oriented to self and place, speech is nonfluent and can barely speak a few words and nods appropriately.  Follows simple midline in 1 and occasional two-step commands.  Unable to name and repeat  Cranial Nerves II - XII - II - left dense homonymous hemianopia. III, IV, VI - Extraocular movements intact but right gaze preference with slight limitation of left lateral gaze V - Facial sensation intact bilaterally. VII - Facial movement intact bilaterally. VIII - Hearing & vestibular intact bilaterally. X - Palate elevates symmetrically. XI - Chin turning & shoulder shrug intact bilaterally. XII - Tongue protrusion intact.  Motor Strength - [name] was normal in right sided extremities and mild 4/5 weakness on the left.  Mild weakness of left grip. Bulk was normal and  fasciculations were absent.    Mild action tremor of the left upper and lower extremity.  No definite choreiform movements noted possibly some pseudoathetosis of the left upper extremity when it is outstretched and eyes closed Motor Tone - Muscle tone was assessed at the neck and appendages and was normal.  Reflexes - [name] were symmetrical in all extremities and he had no pathological reflexes.  Sensory - Light touch, temperature/pinprick were assessed and were symmetrical.  Coordination impaired left finger-to-nose and knee to heel coordination.Gait and Station - not tested   ASSESSMENT/PLAN [name] is a 68 y.o. 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    Stroke:   R large PCA infarct s/p IR w/ TICI2b revascularization, secondary to large vessel disease . Severe chronic ishemic disease. Acute/subacute bilateral infarcts(MRI 05/02/2019) may be due to small vessel disease. Code Stroke CT head No acute abnormality. Old R frontal infarct.    CTA head & neck - R P1 occlusion. Small BA w/ high grade atheromatous narrowing. L ICA siphon stenosis.  CT perfusion 19 cc penumbra  Cerebral angio TICI 2b revascularization occluded R PCOM and P PCA. Underlying large vessel atherosclerosis   MRI Rt PCA infarct    CT repeat - 04/11/2019 -  Evolving R PCA infarct. No progression. No hemorrhage  Repeat MRI w/w/o contrast new acute/subacute infarcts left BG and right cerebellar peduncle 05/02/2019   2D Echo EF 60 to 65%.   Continue tele monitoring, so far no afib found   HIV neg  LDL 101   HgbA1c 9.8  P2Y12 90  Lovenox 40 mg sq daily  for VTE prophylaxis  aspirin 81 mg daily and clopidogrel 75 mg daily prior to admission, put on aspirin 325 mg daily and clopidogrel 75 mg daily. However, developed itchiness, plavix was discontinued. Now on ASA 81 and Brilinta 90 bid. Continue on discharge.  Therapy recommendations:   SNF  Disposition:  Pending  Hx stroke/TIA    11/2018 - presented for dizziness and unsteady, MRI showed right frontal and parietal small white matter infarcts. No LVO. Extensive SVD.  Put on DAPT.  Patient signed out [facility]  05/02/2019: Repeat MRI w/w/o contrast new acute/subacute infarcts left BG and right cerebellar peduncle 05/02/2019, 4-40mm vestibular schwannoma  01/2016 - admitted for gait disability.  MRI negative for acute infarct.  Old infarcts B BG, thalami, L cerebellum.   Left upper extremity involuntary movement  Likely due to right thalamic involvement of current stroke  Semi-involuntary during awake, absent during sleep  Not able to afford tetrabenazine  Put on abilify 5->10mg . However, pt developed vomiting episodes x 2 days. Improved after off abilify.  Hemichorea movement slowly improving, none seen today  ?? PD with left hand tremor diagnosed in [place] - wife not sure if he was on any medication before.   Cognitive decline, agitation and restless  CT head unchanged; EEG no seizure  Intermittent restless  Did not sleep last night as per RN  seroquel to 50 QHs and 25 q am  Continue aricept 5mg  daily - increase to 10mg  in one month (05/16/2019) if tolerated  Hypertension  Home meds:  None listed  Stable 130-140s  Lotensin 5mg  daily stopped 8/8   If BP in the 150s, resume lower dose lotensin . BP goal normotensive  UTI, resolved  U/A WBC > 50  IV Rocephin 8/9 x 3 doses - completed  UCx w/ mult morphocytoes, recollection x 2 w/ multiple species  Hyperlipidemia  Home meds:  No statin listed  LDL 101, goal < 70  AST and ALT normalized   Continue Lipitor 40  Continue statin on discharge  Diabetes type II, uncontrolled->stable  Home meds:  glucophage 1000 bid  HgbA1c 9.8, goal < 7.0  On metformin 1000mg  bid from 7/11  CBGs  SSI  Dysphagia, resolved . 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 tele negative for afib.  Likely small-vessel strokes, incidental and have not caused any significant clinical change in his exam Continue ongoing current care.  Patient on difficult to place list for nursing home due to lack of insurance and his age as per social worker  Shantese Raven  Leonie Man, MD Stroke Neurology      To contact Stroke Continuity provider, please refer to [email or web address] After hours, contact General Neurology

## 2019-05-08 NOTE — Plan of Care (Signed)
Patient stable, discussed POC with patient and spouse, agreeable with plan, denies question/concerns at this time.  

## 2019-05-08 NOTE — Progress Notes (Signed)
  Speech Language Pathology Treatment: Cognitive-Linquistic  Patient Details Name: Joe Perry MRN: [id number] DOB: [birth year] Today's Date: 05/08/2019 Time: [id number] SLP Time Calculation (min) (ACUTE ONLY): 15 min  Assessment / Plan / Recommendation Clinical Impression  Pt was seen for treatment and was cooperative during the session. However, he exhibited difficulty maintaining alertness despite constant verbal stimulation and the session was ultimately abbreviated due to his difficulty maintaining alertness. He demonstrated 20% accuracy with confrontational naming of objects increasing to 40% with mod cues. He stated that he was 68 years old and was not responsive to cues to provide his accurate age. He followed 1-step commands with 100% accuracy when tactile cues were given. SLP will continue to follow pt.    HPI HPI: 68 year old male admitted 03/10/2019 with left weakness, unsteady gait, right gaze, left hemianopsia. PMH: CVA, HTN, DM. CT negative. MRI Ischemic infarct of the right posterior cerebral artery, territory, roughly corresponding to the ischemic volume.      SLP Plan  Continue with current plan of care       Recommendations                   Oral Care Recommendations: Oral care BID Follow up Recommendations: Skilled Nursing facility SLP Visit Diagnosis: Cognitive communication deficit (R41.841);Aphasia (R47.01);Dysphagia, unspecified (R13.10) Plan: Continue with current plan of care       Nehemias Sauceda I. Hardin Negus, [place], [place] Office number [phone] Pager [place] 05/08/2019, 12:56 PM

## 2019-05-08 NOTE — Progress Notes (Signed)
Inpatient Diabetes Program Recommendations  AACE/ADA: New Consensus Statement on Inpatient Glycemic Control (2015)  Target Ranges:  Prepandial:   less than 140 mg/dL      Peak postprandial:   less than 180 mg/dL (1-2 hours)      Critically ill patients:  140 - 180 mg/dL   Lab Results  Component Value Date   GLUCAP 93 05/08/2019   HGBA1C 9.8 (H) 03/11/2019    Review of Glycemic Control  Results for Joe Perry, Joe Perry (MRN [id number]) as of 05/08/2019 09:06  Ref. Range 05/07/2019 05:53 05/07/2019 11:20 05/07/2019 16:04 05/07/2019 20:56 05/08/2019 06:47  Glucose-Capillary Latest Ref Range: 70 - 99 mg/dL 100 (H) 132 (H) 68 (L) 93 93   Diabetes history: DM 2 Outpatient Diabetes medications: Prescribed metformin not taking  Current orders for Inpatient glycemic control:  Novolog 0-15 units tid Metformin 1000 mg bid  Inpatient Diabetes Program Recommendations:    Hypoglycemia this am. Consider decreasing Novolog Correction to Sensitive 0-9 units tid.  Thanks,  Tama Headings RN, MSN, BC-ADM Inpatient Diabetes Coordinator Team Pager [phone] (8a-5p)

## 2019-05-09 LAB — GLUCOSE, CAPILLARY
Glucose-Capillary: 94 mg/dL (ref 70–99)
Glucose-Capillary: 159 mg/dL — ABNORMAL HIGH (ref 70–99)

## 2019-05-09 NOTE — Progress Notes (Signed)
Pt agitated, requiring frequent redirection. Seroquel 50mg  admin now per Dr. Leonie Man.

## 2019-05-09 NOTE — Progress Notes (Signed)
Physical Therapy Treatment Patient Details Name: Joe Perry MRN: [id number] DOB: 1950/11/06 Today's Date: 05/09/2019    History of Present Illness 68 yo male s/p  R PCOM and R PCA occlusion s/p emergent thrombectomy on 7/6. Pt admitteed with L sided weakness, R gaze perference, L homonymous hemianopsia. 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 MRI 8/28 showed new acute to subacute infarcts in the left BG and right cerebellar peduncle.    PT Comments    Pt impulsive requiring frequent VC for safety. He was able to progress OOB to recliner chair with mod A. He performed transfers better without use of RW than with. SNF continues to remain appropriate for d/c.  Follow Up Recommendations  SNF;Supervision/Assistance - 24 hour     Equipment Recommendations  Rolling walker with 5" wheels;3in1 (PT);Wheelchair (measurements PT);Wheelchair cushion (measurements PT)    Recommendations for Other Services       Precautions / Restrictions Precautions Precautions: Fall;Other (comment) Precaution Comments: L lateral lean, L hemianopsia, L inattention, impulsive Restrictions Weight Bearing Restrictions: No    Mobility  Bed Mobility Overal bed mobility: Needs Assistance Bed Mobility: Supine to Sit     Supine to sit: Min assist;HOB elevated     General bed mobility comments: Pt able to rise with min A and multimodal cues for sequencing and technique.  Transfers Overall transfer level: Needs assistance Equipment used: Rolling walker (2 wheeled);None Transfers: Sit to/from [employer] to Stand: Min assist Stand pivot transfers: Mod assist       General transfer comment: Attempted sit<>stand with RW. Pt with poor RW control and frequently picking up RW. Instead performed sit<>stand 10 with face to face and hands on assist at gait belt. SPT perfromed with mod A for stability and multimodal cues for weight shift.   Ambulation/Gait             General Gait  Details: unsafe to attempt with +1 assist   Stairs             Wheelchair Mobility    Modified Rankin (Stroke Patients Only) Modified Rankin (Stroke Patients Only) Pre-Morbid Rankin Score: No symptoms Modified Rankin: Moderately severe disability     Balance Overall balance assessment: Needs assistance Sitting-balance support: Feet supported;No upper extremity supported Sitting balance-[name]: Good Sitting balance - Comments: able to remain upright in recliner chair without cuing for midline orientation Postural control: Posterior lean;Left lateral lean Standing balance support: Bilateral upper extremity supported Standing balance-[name]: Poor Standing balance comment: L lateral lean                            Cognition Arousal/Alertness: Awake/alert Behavior During Therapy: Flat affect;Impulsive Overall Cognitive Status: Impaired/Different from baseline Area of Impairment: Attention;Memory;Safety/judgement;Awareness;Following commands;Problem solving;Orientation                   Current Attention Level: Sustained Memory: Decreased recall of precautions;Decreased short-term memory Following Commands: Follows one step commands with increased time;Follows one step commands inconsistently Safety/Judgement: Decreased awareness of safety;Decreased awareness of deficits Awareness: Intellectual Problem Solving: Slow processing;Difficulty sequencing;Requires verbal cues;Requires tactile cues General Comments: Pt engagin in therapy. Slow procressing and increased time required for commands. Impulsive, as pt was truing to climb out of bed several times while therapist was in room. Once seated EOB pt again impulsive trying to get to recliner chair, requiring VC to wait.      Exercises  General Comments        Pertinent Vitals/Pain Pain Assessment: No/denies pain Pain Intervention(s): Monitored during session;Repositioned    Home Living                       Prior Function            PT Goals (current goals can now be found in the care plan section) Acute Rehab PT Goals Patient Stated Goal: did not state PT Goal Formulation: Patient unable to participate in goal setting Time For Goal Achievement: 05/19/19 Potential to Achieve Goals: Fair Progress towards PT goals: Progressing toward goals    Frequency    Min 3X/week      PT Plan Current plan remains appropriate    Co-evaluation              AM-PAC PT "6 Clicks" Mobility   Outcome Measure  Help needed turning from your back to your side while in a flat bed without using bedrails?: None Help needed moving from lying on your back to sitting on the side of a flat bed without using bedrails?: A Little Help needed moving to and from a bed to a chair (including a wheelchair)?: A Lot Help needed standing up from a chair using your arms (e.g., wheelchair or bedside chair)?: A Little Help needed to walk in hospital room?: A Lot Help needed climbing 3-5 steps with a railing? : A Lot 6 Click Score: 16    End of Session Equipment Utilized During Treatment: Gait belt Activity Tolerance: Patient tolerated treatment well Patient left: in chair;with chair alarm set;with call bell/phone within reach Nurse Communication: Mobility status PT Visit Diagnosis: Muscle weakness (generalized) (M62.81);Hemiplegia and hemiparesis;Other abnormalities of gait and mobility (R26.89) Hemiplegia - Right/Left: Left Hemiplegia - dominant/non-dominant: Dominant Hemiplegia - caused by: Cerebral infarction     Time: 1351-1414 PT Time Calculation (min) (ACUTE ONLY): 23 min  Charges:  $Therapeutic Activity: 8-22 mins $Neuromuscular Re-education: 8-22 mins                     Benjiman Core, [place] Pager [phone] Acute Rehab   Allena Katz 05/09/2019, 3:03 PM

## 2019-05-09 NOTE — Progress Notes (Signed)
  Speech Language Pathology Treatment: Dysphagia  Patient Details Name: LUISALBERTO HEIMBERGER MRN: [id number] DOB: 04-26-1951 Today's Date: 05/09/2019 Time: [id number] SLP Time Calculation (min) (ACUTE ONLY): 15 min  Assessment / Plan / Recommendation Clinical Impression  Skilled treatment session focused on dysphagia. SLP recieved pt in bed, pt generally confused. Pt willing to consume puree with thin liquids (pt's current diet). Pt demonstrated varying oral prep times but demonstrated effective clearing with each bolus. Pt also consumed thin liquids via straw with no overt s/s of aspiration. Soft solids were also attempted but pt with increased oral residue and likely decreased bolus management of particles within soft solid. Recommend continuing current diet.    HPI HPI: 68 year old male admitted 03/10/2019 with left weakness, unsteady gait, right gaze, left hemianopsia. PMH: CVA, HTN, DM. CT negative. MRI Ischemic infarct of the right posterior cerebral artery, territory, roughly corresponding to the ischemic volume.      SLP Plan  Continue with current plan of care       Recommendations  Diet recommendations: Dysphagia 1 (puree);Thin liquid Liquids provided via: Cup;Straw Medication Administration: Crushed with puree Supervision: Staff to assist with self feeding;Full supervision/cueing for compensatory strategies Compensations: Slow rate;Small sips/bites;Minimize environmental distractions;Lingual sweep for clearance of pocketing Postural Changes and/or Swallow Maneuvers: Seated upright 90 degrees                Oral Care Recommendations: Oral care BID Follow up Recommendations: Skilled Nursing facility SLP Visit Diagnosis: Dysphagia, oropharyngeal phase (R13.12) Plan: Continue with current plan of care       GO                Harmonii Karle 05/09/2019, 3:48 PM

## 2019-05-09 NOTE — Progress Notes (Signed)
STROKE TEAM PROGRESS NOTE   Interval history:  Patient is lying comfortably in bed.  He has no complaints.  Vital signs are stable.  Neurological exam is unchanged.  No changes  Vitals:   05/09/19 0003 05/09/19 0312 05/09/19 0944 05/09/19 1032  BP: 114/68 127/60 (!) 147/77 128/74  Pulse: 72 (!) 48 90 72  Resp: 18 16 16 17   Temp: 97.6 F (36.4 C) 98.7 F (37.1 C) 97.8 F (36.6 C) 98 F (36.7 C)  TempSrc: Axillary Oral Oral Oral  SpO2: 100% 99% 91% 100%  Weight:      Height:        CBC:  Recent Labs  Lab 05/03/19 0749 05/07/19 1020  WBC 4.2 4.3  HGB 13.0 13.6  HCT 40.5 42.4  MCV 96.9 94.6  PLT 247 [id number]    Basic Metabolic Panel:  Recent Labs  Lab 05/03/19 0749 05/07/19 1020  NA 145 143  K 3.9 3.7  CL 108 106  CO2 26 25  GLUCOSE 99 144*  BUN 17 14  CREATININE 0.88 0.81  CALCIUM 9.6 9.8   IMAGING past 24h No results found.    PHYSICAL EXAM      General - Well nourished, well developed, African male not in distress Ophthalmologic - fundi not visualized due to noncooperation. Lungs clear to auscultation Cardiovascular - Regular rate and rhythm.  Mental Status -  Awake and alert, oriented to self and place, speech is nonfluent and can barely speak a few words and nods appropriately.  Follows simple midline in 1 and occasional two-step commands.  Unable to name and repeat  Cranial Nerves II - XII - II - left dense homonymous hemianopia. III, IV, VI - Extraocular movements intact but right gaze preference with slight limitation of left lateral gaze V - Facial sensation intact bilaterally. VII - Facial movement intact bilaterally. VIII - Hearing & vestibular intact bilaterally. X - Palate elevates symmetrically. XI - Chin turning & shoulder shrug intact bilaterally. XII - Tongue protrusion intact.  Motor Strength - [name] was normal in right sided extremities and mild 4/5 weakness on the left.  Mild weakness of left grip. Bulk was normal  and fasciculations were absent.    Mild action tremor of the left upper and lower extremity.  No definite choreiform movements noted possibly some pseudoathetosis of the left upper extremity when it is outstretched and eyes closed Motor Tone - Muscle tone was assessed at the neck and appendages and was normal.  Reflexes - [name] were symmetrical in all extremities and he had no pathological reflexes.  Sensory - Light touch, temperature/pinprick were assessed and were symmetrical.  Coordination impaired left finger-to-nose and knee to heel coordination.Gait and Station - not tested   ASSESSMENT/PLAN [name] is a 68 y.o. 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    Stroke:   R large PCA infarct s/p IR w/ TICI2b revascularization, secondary to large vessel disease . Severe chronic ishemic disease. Acute/subacute bilateral infarcts(MRI 05/02/2019) may be due to small vessel disease. Code Stroke CT head No acute abnormality. Old R frontal infarct.    CTA head & neck - R P1 occlusion. Small BA w/ high grade atheromatous narrowing. L ICA siphon stenosis.  CT perfusion 19 cc penumbra  Cerebral angio TICI 2b revascularization occluded R PCOM and P PCA. Underlying large vessel atherosclerosis   MRI Rt PCA infarct    CT repeat -  04/11/2019 - Evolving R PCA infarct. No progression. No hemorrhage  Repeat MRI w/w/o contrast new acute/subacute infarcts left BG and right cerebellar peduncle 05/02/2019   2D Echo EF 60 to 65%.   Continue tele monitoring, so far no afib found   HIV neg  LDL 101   HgbA1c 9.8  P2Y12 90  Lovenox 40 mg sq daily  for VTE prophylaxis  aspirin 81 mg daily and clopidogrel 75 mg daily prior to admission, put on aspirin 325 mg daily and clopidogrel 75 mg daily. However, developed itchiness, plavix was discontinued. Now on ASA 81 and Brilinta 90 bid. Continue on discharge.  Therapy  recommendations:  SNF  Disposition:  Pending  Hx stroke/TIA    11/2018 - presented for dizziness and unsteady, MRI showed right frontal and parietal small white matter infarcts. No LVO. Extensive SVD.  Put on DAPT.  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  MRI negative for acute infarct.  Old infarcts B BG, thalami, L cerebellum.   Left upper extremity involuntary movement  Likely due to right thalamic involvement of current stroke  Semi-involuntary during awake, absent during sleep  Not able to afford tetrabenazine  Put on abilify 5->10mg . However, pt developed vomiting episodes x 2 days. Improved after off abilify.  Hemichorea movement slowly improving, none seen today  ?? PD with left hand tremor diagnosed in [place] - wife not sure if he was on any medication before.   Cognitive decline, agitation and restless  CT head unchanged; EEG no seizure  Intermittent restless  Did not sleep last night as per RN  seroquel to 50 QHs and 25 q am  Continue aricept 5mg  daily - increase to 10mg  in one month (05/16/2019) if tolerated  Hypertension  Home meds:  None listed  Stable 130-140s  Lotensin 5mg  daily stopped 8/8   If BP in the 150s, resume lower dose lotensin . BP goal normotensive  UTI, resolved  U/A WBC > 50  IV Rocephin 8/9 x 3 doses - completed  UCx w/ mult morphocytoes, recollection x 2 w/ multiple species  Hyperlipidemia  Home meds:  No statin listed  LDL 101, goal < 70  AST and ALT normalized   Continue Lipitor 40  Continue statin on discharge  Diabetes type II, uncontrolled->stable  Home meds:  glucophage 1000 bid  HgbA1c 9.8, goal < 7.0  On metformin 1000mg  bid from 7/11  CBGs  SSI  Dysphagia, resolved . 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 tele negative for afib.  Likely small-vessel strokes, incidental and have not caused any significant clinical change in his exam Continue ongoing current care.  Patient on difficult to place list for nursing home due to lack of insurance and his age as per social worker  Antony Contras, MD        To contact Stroke Continuity provider, please refer to [email or web address] After hours, contact General Neurology

## 2019-05-10 LAB — CBC
HCT: 40.5 % (ref 39.0–52.0)
Hemoglobin: 13.2 g/dL (ref 13.0–17.0)
MCH: 30.7 pg (ref 26.0–34.0)
MCHC: 32.6 g/dL (ref 30.0–36.0)
MCV: 94.2 fL (ref 80.0–100.0)
Platelets: 253 10*3/uL (ref 150–400)
RBC: 4.3 MIL/uL (ref 4.22–5.81)
RDW: 13.4 % (ref 11.5–15.5)
WBC: 4.3 10*3/uL (ref 4.0–10.5)
nRBC: 0 % (ref 0.0–0.2)

## 2019-05-10 LAB — BASIC METABOLIC PANEL
Anion gap: 10 (ref 5–15)
BUN: 12 mg/dL (ref 8–23)
CO2: 25 mmol/L (ref 22–32)
Calcium: 9.7 mg/dL (ref 8.9–10.3)
Chloride: 107 mmol/L (ref 98–111)
Creatinine, Ser: 0.67 mg/dL (ref 0.61–1.24)
GFR calc Af Amer: >60 mL/min (ref >60)
GFR calc non Af Amer: >60 mL/min (ref >60)
Glucose, Bld: 87 mg/dL (ref 70–99)
Potassium: 4 mmol/L (ref 3.5–5.1)
Sodium: 142 mmol/L (ref 135–145)

## 2019-05-10 LAB — GLUCOSE, CAPILLARY
Glucose-Capillary: 97 mg/dL (ref 70–99)
Glucose-Capillary: 82 mg/dL (ref 70–99)
Glucose-Capillary: 125 mg/dL — ABNORMAL HIGH (ref 70–99)

## 2019-05-10 NOTE — Progress Notes (Signed)
STROKE TEAM PROGRESS NOTE   Interval history:  Patient is lying comfortably in bed.  Orientated to place and self, not to age or time. Still has left hemichorea movement and left hemianopia.   Vitals:   05/09/19 2335 05/10/19 0326 05/10/19 0746 05/10/19 1151  BP: 127/67 136/77 132/69 (!) 129/91  Pulse: (!) 50 72 (!) 50 (!) 53  Resp: 17 17 14 17   Temp:  97.6 F (36.4 C) 97.9 F (36.6 C) 97.7 F (36.5 C)  TempSrc: Oral Oral Oral Oral  SpO2:   95% 94%  Weight:      Height:        CBC:  Recent Labs  Lab 05/07/19 1020 05/10/19 0752  WBC 4.3 4.3  HGB 13.6 13.2  HCT 42.4 40.5  MCV 94.6 94.2  PLT 279 [id number]    Basic Metabolic Panel:  Recent Labs  Lab 05/07/19 1020 05/10/19 0752  NA 143 142  K 3.7 4.0  CL 106 107  CO2 25 25  GLUCOSE 144* 87  BUN 14 12  CREATININE 0.81 0.67  CALCIUM 9.8 9.7   IMAGING past 24h No results found.    PHYSICAL EXAM      General - Well nourished, well developed, African male not in distress Ophthalmologic - fundi not visualized due to noncooperation. Lungs clear to auscultation Cardiovascular - Regular rate and rhythm.  Mental Status -  Awake and alert, oriented to self and place, speech is nonfluent and can barely speak a few words and nods appropriately.  Follows simple midline in 1 and occasional two-step commands.  Unable to name and repeat  Cranial Nerves II - XII - II - left dense homonymous hemianopia. III, IV, VI - Extraocular movements intact but right gaze preference with slight limitation of left lateral gaze V - Facial sensation intact bilaterally. VII - Facial movement intact bilaterally. VIII - Hearing & vestibular intact bilaterally. X - Palate elevates symmetrically. XI - Chin turning & shoulder shrug intact bilaterally. XII - Tongue protrusion intact.  Motor Strength - [name] was normal in right sided extremities and mild 4/5 weakness on the left.  Mild weakness of left grip. Bulk was normal and  fasciculations were absent.   Motor Tone - Muscle tone was assessed at the neck and appendages and was normal.  Reflexes - [name] were symmetrical in all extremities and he had no pathological reflexes.  Sensory - Light touch, temperature/pinprick were assessed and were symmetrical.  Coordination impaired left finger-to-nose and knee to heel coordination. Left hemichorea movement while awake, resolved on sleeping  Gait and Station - not tested   ASSESSMENT/PLAN Joe Perry is a 68 y.o. 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   Stroke:   R large PCA infarct s/p IR w/ TICI2b revascularization, secondary to large vessel disease . Acute/subacute left BG and right cerebellar peduncle may be due to small vessel disease.   Code Stroke CT head No acute abnormality. Old R frontal infarct.    CTA head & neck - R P1 occlusion. Small BA w/ high grade atheromatous narrowing. L ICA siphon stenosis.  CT perfusion 19 cc penumbra  Cerebral angio TICI 2b revascularization occluded R PCOM and P PCA. Underlying large vessel atherosclerosis   MRI Rt PCA infarct    CT repeat - 04/11/2019 - Evolving R PCA infarct. No progression. No hemorrhage  Repeat MRI w/w/o contrast new acute/subacute infarcts left BG  and right cerebellar peduncle 05/02/2019   2D Echo EF 60 to 65%.   Continue tele monitoring, so far no afib found   HIV neg  LDL 101   HgbA1c 9.8  P2Y12 90  Lovenox 40 mg sq daily  for VTE prophylaxis  aspirin 81 mg daily and clopidogrel 75 mg daily prior to admission, put on aspirin 325 mg daily and clopidogrel 75 mg daily. However, developed itchiness, plavix was discontinued. Now on ASA 81 and Brilinta 90 bid. Continue on discharge.  Therapy recommendations:  SNF  Disposition:  Pending  Hx stroke/TIA   11/2018 - presented for dizziness and unsteady, MRI showed right frontal and parietal small white matter  infarcts. No LVO. Extensive SVD.  Put on DAPT.  [name]  01/2016 - admitted for gait disability.  MRI negative for acute infarct.  Old infarcts B BG, thalami, L cerebellum.   Left upper extremity involuntary movement  Likely due to right thalamic involvement of current stroke  Semi-involuntary during awake, absent during sleep  Not able to afford tetrabenazine  Put on abilify 5->10mg . However, pt developed vomiting episodes x 2 days. Improved after off abilify.  Hemichorea movement slowly improving, none seen today  ?? PD with left hand tremor diagnosed in [place] - wife not sure if he was on any medication before.   Cognitive decline, agitation and restless  CT head unchanged; EEG no seizure  Intermittent restless  Did not sleep last night as per RN  seroquel to 50 QHs and 25 q am  Continue aricept 5mg  daily - increase to 10mg  in one month (05/16/2019) if tolerated  Hypertension  Home meds:  None listed  Stable 130-140s  Lotensin 5mg  daily stopped 8/8   If BP in the 150s, resume lower dose lotensin . BP goal normotensive  UTI, resolved  U/A WBC > 50  IV Rocephin 8/9 x 3 doses - completed  UCx w/ mult morphocytoes, recollection x 2 w/ multiple species  Hyperlipidemia  Home meds:  No statin listed  LDL 101, goal < 70  AST and ALT normalized   Continue Lipitor 40  Continue statin on discharge  Diabetes type II, uncontrolled->stable  Home meds:  glucophage 1000 bid  HgbA1c 9.8, goal < 7.0  On metformin 1000mg  bid from 7/11  CBGs  SSI  Dysphagia, resolved . 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 After hours, contact General Neurology

## 2019-05-11 LAB — GLUCOSE, CAPILLARY
Glucose-Capillary: 150 mg/dL — ABNORMAL HIGH (ref 70–99)
Glucose-Capillary: 96 mg/dL (ref 70–99)
Glucose-Capillary: 154 mg/dL — ABNORMAL HIGH (ref 70–99)

## 2019-05-11 MED ORDER — ONDANSETRON HCL 4 MG/2ML IJ SOLN
4.0000 mg | Freq: Three times a day (TID) | INTRAMUSCULAR | Status: DC | PRN
  Administered 2019-05-11: 4 mg via INTRAVENOUS
  Filled 2019-05-11: qty 2

## 2019-05-11 NOTE — Progress Notes (Signed)
STROKE TEAM PROGRESS NOTE   Interval history:  Patient is lying comfortably in bed.  Sleeping but easily arousable. No neuro changes, had one vomiting this am.    Vitals:   05/11/19 0051 05/11/19 0140 05/11/19 0413 05/11/19 0748  BP: (!) 134/95 (!) 137/102 115/68 96/70  Pulse: 85 81 63 (!) 59  Resp: 18 18 19 15   Temp: 98.3 F (36.8 C) 98 F (36.7 C) 98.2 F (36.8 C) 98.1 F (36.7 C)  TempSrc:    Axillary  SpO2:  97%  97%  Weight:      Height:        CBC:  Recent Labs  Lab 05/07/19 1020 05/10/19 0752  WBC 4.3 4.3  HGB 13.6 13.2  HCT 42.4 40.5  MCV 94.6 94.2  PLT 279 [id number]    Basic Metabolic Panel:  Recent Labs  Lab 05/07/19 1020 05/10/19 0752  NA 143 142  K 3.7 4.0  CL 106 107  CO2 25 25  GLUCOSE 144* 87  BUN 14 12  CREATININE 0.81 0.67  CALCIUM 9.8 9.7   IMAGING past 24h No results found.    PHYSICAL EXAM      General - Well nourished, well developed, African male not in distress Ophthalmologic - fundi not visualized due to noncooperation. Lungs clear to auscultation Cardiovascular - Regular rate and rhythm.  Mental Status -  Awake and alert, oriented to self and place, speech is nonfluent and can barely speak a few words and nods appropriately.  Follows simple midline in 1 and occasional two-step commands.  Unable to name and repeat  Cranial Nerves II - XII - II - left dense homonymous hemianopia. III, IV, VI - Extraocular movements intact but right gaze preference with slight limitation of left lateral gaze V - Facial sensation intact bilaterally. VII - Facial movement intact bilaterally. VIII - Hearing & vestibular intact bilaterally. X - Palate elevates symmetrically. XI - Chin turning & shoulder shrug intact bilaterally. XII - Tongue protrusion intact.  Motor Strength - [name] was normal in right sided extremities and mild 4/5 weakness on the left.  Mild weakness of left grip. Bulk was normal and fasciculations were absent.    Motor Tone - Muscle tone was assessed at the neck and appendages and was normal.  Reflexes - [name] were symmetrical in all extremities and he had no pathological reflexes.  Sensory - Light touch, temperature/pinprick were assessed and were symmetrical.  Coordination impaired left finger-to-nose and knee to heel coordination. Left hemichorea movement while awake, resolved on sleeping  Gait and Station - not tested   ASSESSMENT/PLAN [name] is a 68 y.o. 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   Stroke:   R large PCA infarct s/p IR w/ TICI2b revascularization, secondary to large vessel disease . Acute/subacute left BG and right cerebellar peduncle may be due to small vessel disease.   Code Stroke CT head No acute abnormality. Old R frontal infarct.    CTA head & neck - R P1 occlusion. Small BA w/ high grade atheromatous narrowing. L ICA siphon stenosis.  CT perfusion 19 cc penumbra  Cerebral angio TICI 2b revascularization occluded R PCOM and P PCA. Underlying large vessel atherosclerosis   MRI Rt PCA infarct    CT repeat - 04/11/2019 - Evolving R PCA infarct. No progression. No hemorrhage  Repeat MRI w/w/o contrast new acute/subacute infarcts left BG and right cerebellar  peduncle 05/02/2019   2D Echo EF 60 to 65%.   Continue tele monitoring, so far no afib found   HIV neg  LDL 101   HgbA1c 9.8  P2Y12 90  Lovenox 40 mg sq daily  for VTE prophylaxis  aspirin 81 mg daily and clopidogrel 75 mg daily prior to admission, put on aspirin 325 mg daily and clopidogrel 75 mg daily. However, developed itchiness, plavix was discontinued. Now on ASA 81 and Brilinta 90 bid. Continue on discharge.  Therapy recommendations:  SNF  Disposition:  Pending  Hx stroke/TIA   11/2018 - presented for dizziness and unsteady, MRI showed right frontal and parietal small white matter infarcts. No LVO. Extensive  SVD.  Put on DAPT.  Patient signed out [facility]  01/2016 - admitted for gait disability.  MRI negative for acute infarct.  Old infarcts B BG, thalami, L cerebellum.   Left upper extremity involuntary movement  Likely due to right thalamic involvement of current stroke  Semi-involuntary during awake, absent during sleep  Not able to afford tetrabenazine  Put on abilify 5->10mg . However, pt developed vomiting episodes x 2 days. Improved after off abilify.  Hemichorea movement slowly improving, none seen today  ?? PD with left hand tremor diagnosed in [place] - wife not sure if he was on any medication before.   Cognitive decline, agitation and restless  CT head unchanged; EEG no seizure  Intermittent restless  Did not sleep last night as per RN  seroquel to 50 QHs and 25 q am  Continue aricept 5mg  daily - increase to 10mg  in one month (05/16/2019) if tolerated  Hypertension  Home meds:  None listed  Stable 130-140s  Lotensin 5mg  daily stopped 8/8   If BP in the 150s, resume lower dose lotensin . BP goal normotensive  UTI, resolved  U/A WBC > 50  IV Rocephin 8/9 x 3 doses - completed  UCx w/ mult morphocytoes, recollection x 2 w/ multiple species  Hyperlipidemia  Home meds:  No statin listed  LDL 101, goal < 70  AST and ALT normalized   Continue Lipitor 40  Continue statin on discharge  Diabetes type II, uncontrolled->stable  Home meds:  glucophage 1000 bid  HgbA1c 9.8, goal < 7.0  On metformin 1000mg  bid from 7/11  CBGs  SSI  Dysphagia, resolved . 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 After hours, contact General Neurology

## 2019-05-11 NOTE — Progress Notes (Signed)
Pt vomited x 1  Emesis full of food particles  md notified  Zofran  4 mg iv given and it was effective. No vomiting this am. Pt moves a lot in bed very confused.

## 2019-05-12 LAB — GLUCOSE, CAPILLARY
Glucose-Capillary: 124 mg/dL — ABNORMAL HIGH (ref 70–99)
Glucose-Capillary: 134 mg/dL — ABNORMAL HIGH (ref 70–99)
Glucose-Capillary: 46 mg/dL — ABNORMAL LOW (ref 70–99)
Glucose-Capillary: 101 mg/dL — ABNORMAL HIGH (ref 70–99)
Glucose-Capillary: 87 mg/dL (ref 70–99)

## 2019-05-12 NOTE — Progress Notes (Signed)
  Speech Language Pathology Treatment: Cognitive-Linquistic  Patient Details Name: Joe Perry MRN: [id number] DOB: 1951/03/18 Today's Date: 05/12/2019 Time: 1643-1700 SLP Time Calculation (min) (ACUTE ONLY): 17 min  Assessment / Plan / Recommendation Clinical Impression  Pt was seen for language/cognition treatment and was alert throughout the session. He was able to appropriately respond to some open ended questions using short sentences. For example, with relation to the drink which the RN provided he stated, " I'm not sucking right now" when he was asked if he had finished the drink. However, perseveration was still noted throughout the session and his utterance length remains reduced. He demonstrated 40% accuracy with sentence completion increasing to 60% with mod cues. He responded to complex yes/no questions with 60% accuracy. He was oriented to place but not city and was disoriented to time despite mod-max cues for reasoning. SLP will continue to follow pt.      HPI HPI: 68 year old male admitted 03/10/2019 with left weakness, unsteady gait, right gaze, left hemianopsia. PMH: CVA, HTN, DM. CT negative. MRI Ischemic infarct of the right posterior cerebral artery, territory, roughly corresponding to the ischemic volume.      SLP Plan  Continue with current plan of care       Recommendations                   Oral Care Recommendations: Oral care BID Follow up Recommendations: Skilled Nursing facility SLP Visit Diagnosis: Cognitive communication deficit (R41.841);Aphasia (R47.01);Dysphagia, unspecified (R13.10) Plan: Continue with current plan of care       Kelvin Sennett I. Hardin Negus, [place], [place] Office number [phone] Pager [place] 05/12/2019, 5:31 PM

## 2019-05-12 NOTE — Plan of Care (Signed)
Progressing towards goals

## 2019-05-12 NOTE — Progress Notes (Signed)
Occupational Therapy Treatment Patient Details Name: Joe Perry MRN: [id number] DOB: [birth year] Today's Date: 05/12/2019    History of present illness 68 yo male s/p  R PCOM and R PCA occlusion s/p emergent thrombectomy on 7/6. Pt admitteed with L sided weakness, R gaze perference, L homonymous hemianopsia. 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 MRI 8/28 showed new acute to subacute infarcts in the left BG and right cerebellar peduncle.   OT comments  Treatment session with focus on Lt attention, visual scanning, and functional use of LUE.  Pt received supine in bed requiring increased cues and time to engage in bed mobility to come to sitting at EOB.  Pt required min assist for all mobility and up to mod assist with sitting balance due to increased LLE chorea when seated EOB with inability to maintain LLE planted on floor.  Attempted to engage pt in visual scanning to Lt to identify and locate familiar items just Lt of midline.  Pt with inability to maintain alert, therefore returned to supine.  Nurse notified of difficulty maintaining level of alertness to engage in therapeutic activity.  Pt will continue to benefit from OT acutely to decrease burden of care with functional mobility and self-care tasks.    Follow Up Recommendations  SNF;Supervision/Assistance - 24 hour    Equipment Recommendations  (defer to next venue of care)       Precautions / Restrictions Precautions Precautions: Fall;Other (comment) Precaution Comments: L lateral lean, L hemianopsia, L inattention, impulsive Restrictions Weight Bearing Restrictions: No       Mobility Bed Mobility Overal bed mobility: Needs Assistance Bed Mobility: Supine to Sit Rolling: Min assist   Supine to sit: Min assist;HOB elevated Sit to supine: Min assist   General bed mobility comments: Min assist for mobility this session, unable to maintain sitting balance without mod assist due to increased restlessness with picking up LLE  with decreased coordination to maintain LLE planted.  Pt with decreased attention to task this session.     Balance Overall balance assessment: Needs assistance Sitting-balance support: Feet supported;No upper extremity supported Sitting balance-[name]: Fair Sitting balance - Comments: decreased sitting balance this session, requiring mod assist when seated EOB due to decreased control in LLE Postural control: Posterior lean;Left lateral lean                                 ADL either performed or assessed with clinical judgement        Vision Baseline Vision/History: No visual deficits Patient Visual Report: No change from baseline Vision Assessment?: Vision impaired- to be further tested in functional context Eye Alignment: Within Functional Limits Ocular Range of Motion: Restricted on the left Tracking/Visual Pursuits: Left eye does not track laterally;Impaired - to be further tested in functional context Visual Fields: Left visual field deficit          Cognition Arousal/Alertness: Awake/alert Behavior During Therapy: Flat affect;Impulsive Overall Cognitive Status: Impaired/Different from baseline Area of Impairment: Attention;Memory;Safety/judgement;Awareness;Following commands;Problem solving;Orientation                   Current Attention Level: Sustained Memory: Decreased recall of precautions;Decreased short-term memory Following Commands: Follows one step commands with increased time;Follows one step commands inconsistently Safety/Judgement: Decreased awareness of safety;Decreased awareness of deficits Awareness: Intellectual Problem Solving: Slow processing;Difficulty sequencing;Requires verbal cues;Requires tactile cues General Comments: Pt with decreased initiation and attention to task this session,  very lethargic.  Slow procressing and increased time required for commands.                   Pertinent Vitals/ Pain       Pain  Assessment: No/denies pain         Frequency  Min 2X/week        Progress Toward Goals  OT Goals(current goals can now be found in the care plan section)  Progress towards OT goals: Progressing toward goals  Acute Rehab OT Goals Patient Stated Goal: did not state OT Goal Formulation: Patient unable to participate in goal setting Time For Goal Achievement: 05/21/19 Potential to Achieve Goals: [place] Discharge plan remains appropriate       AM-PAC OT "6 Clicks" Daily Activity     Outcome Measure   Help from another person eating meals?: A Lot Help from another person taking care of personal grooming?: A Lot Help from another person toileting, which includes using toliet, bedpan, or urinal?: A Lot Help from another person bathing (including washing, rinsing, drying)?: A Lot Help from another person to put on and taking off regular upper body clothing?: A Lot Help from another person to put on and taking off regular lower body clothing?: A Lot 6 Click Score: 12    End of Session    OT Visit Diagnosis: Cognitive communication deficit (R41.841);Unsteadiness on feet (R26.81) Symptoms and signs involving cognitive functions: Cerebral infarction   Activity Tolerance Patient limited by fatigue   Patient Left in bed;with call bell/phone within reach;with bed alarm set   Nurse Communication Mobility status;Precautions        Time: [id number] OT Time Calculation (min): 12 min  Charges: OT Treatments $Self Care/Home Management : 8-22 mins    Simonne Come [phone] 05/12/2019, 3:21 PM

## 2019-05-13 LAB — GLUCOSE, CAPILLARY
Glucose-Capillary: 99 mg/dL (ref 70–99)
Glucose-Capillary: 116 mg/dL — ABNORMAL HIGH (ref 70–99)
Glucose-Capillary: 118 mg/dL — ABNORMAL HIGH (ref 70–99)
Glucose-Capillary: 81 mg/dL (ref 70–99)
Glucose-Capillary: 127 mg/dL — ABNORMAL HIGH (ref 70–99)
Glucose-Capillary: 100 mg/dL — ABNORMAL HIGH (ref 70–99)
Glucose-Capillary: 119 mg/dL — ABNORMAL HIGH (ref 70–99)

## 2019-05-13 MED ORDER — INSULIN ASPART 100 UNIT/ML ~~LOC~~ SOLN
0.0000 [IU] | Freq: Three times a day (TID) | SUBCUTANEOUS | Status: DC
  Administered 2019-05-14: 2 [IU] via SUBCUTANEOUS
  Administered 2019-05-15: 9 [IU] via SUBCUTANEOUS
  Administered 2019-05-17: 14:00:00 1 [IU] via SUBCUTANEOUS
  Administered 2019-05-18: 10:00:00 3 [IU] via SUBCUTANEOUS
  Administered 2019-05-19: 2 [IU] via SUBCUTANEOUS
  Administered 2019-05-20: 17:00:00 3 [IU] via SUBCUTANEOUS
  Administered 2019-05-21: 2 [IU] via SUBCUTANEOUS
  Administered 2019-05-22: 14:00:00 3 [IU] via SUBCUTANEOUS
  Administered 2019-05-23: 1 [IU] via SUBCUTANEOUS
  Administered 2019-05-23: 13:00:00 5 [IU] via SUBCUTANEOUS
  Administered 2019-05-24 – 2019-05-25 (×4): 3 [IU] via SUBCUTANEOUS
  Administered 2019-05-26: 2 [IU] via SUBCUTANEOUS
  Administered 2019-05-26: 13:00:00 3 [IU] via SUBCUTANEOUS
  Administered 2019-05-27: 2 [IU] via SUBCUTANEOUS
  Administered 2019-05-27: 3 [IU] via SUBCUTANEOUS
  Administered 2019-05-28: 1 [IU] via SUBCUTANEOUS
  Administered 2019-05-28: 12:00:00 2 [IU] via SUBCUTANEOUS
  Administered 2019-05-29 – 2019-06-01 (×3): 1 [IU] via SUBCUTANEOUS
  Administered 2019-06-01: 12:00:00 3 [IU] via SUBCUTANEOUS
  Administered 2019-06-02 – 2019-06-03 (×2): 2 [IU] via SUBCUTANEOUS

## 2019-05-13 MED ORDER — DONEPEZIL HCL 10 MG PO TABS
10.0000 mg | ORAL_TABLET | Freq: Every day | ORAL | Status: DC
  Administered 2019-05-13 – 2019-06-02 (×21): 10 mg via ORAL
  Filled 2019-05-13 (×21): qty 1

## 2019-05-13 NOTE — Progress Notes (Signed)
Physical Therapy Treatment Patient Details Name: Joe Perry MRN: [id number] DOB: [birth year] Today's Date: 05/13/2019    History of Present Illness 68 yo male s/p  R PCOM and R PCA occlusion s/p emergent thrombectomy on 7/6. Pt admitteed with L sided weakness, R gaze perference, L homonymous hemianopsia. 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 MRI 8/28 showed new acute to subacute infarcts in the left BG and right cerebellar peduncle.    PT Comments    Patient progressing slowly towards PT goals. Worked on gait training today with use of rail, HHA, Mod A and close chair follow. Focused on incorporating LUE/LE for functional transfers. Pt demonstrates left lateral lean, narrow BoS and instability. Continues to exhibit left inattention and impulsivity. Will continue to follow and progress as tolerated.   Follow Up Recommendations  SNF;Supervision/Assistance - 24 hour     Equipment Recommendations  Rolling walker with 5" wheels;3in1 (PT);Wheelchair (measurements PT);Wheelchair cushion (measurements PT)    Recommendations for Other Services       Precautions / Restrictions Precautions Precautions: Fall;Other (comment) Precaution Comments: Lft lateral lean, Lft hemianopsia, L ftinattention, impulsive Restrictions Weight Bearing Restrictions: No    Mobility  Bed Mobility Overal bed mobility: Needs Assistance Bed Mobility: Supine to Sit     Supine to sit: Min assist;HOB elevated     General bed mobility comments: Pt attempting to bring LEs over rail; assist with trunk to get to EOB.  Transfers Overall transfer level: Needs assistance Equipment used: None Transfers: Sit to/from Stand Sit to Stand: Mod assist         General transfer comment: Assist to power to standing with cues for hand placement/technique. Encouraged pushing through LUE, needs manual assist to place left hand on arm rest. Pt reaching towards right side to pull up on rail with BUEs. Stood from [employer],  from chair x2. Encouraged WB through BLEs equally for descent into chair and reaching back to [place].  Ambulation/Gait Ambulation/Gait assistance: Mod assist;+2 safety/equipment Gait Distance (Feet): 40 Feet(x2 bouts) Assistive device: (rail in hallway) Gait Pattern/deviations: Step-through pattern;Narrow base of support;Decreased stride length Gait velocity: decreased   General Gait Details: Slow, unsteady gait with narrow BoS and left lateral lean;using rail on RUE and HHA on left. Initially trying to use BUEs on rail. 1 seated rest break.   Stairs             Wheelchair Mobility    Modified Rankin (Stroke Patients Only) Modified Rankin (Stroke Patients Only) Pre-Morbid Rankin Score: No symptoms Modified Rankin: Moderately severe disability     Balance Overall balance assessment: Needs assistance Sitting-balance support: Feet supported;No upper extremity supported Sitting balance-[name]: Fair     Standing balance support: During functional activity Standing balance-[name]: Poor Standing balance comment: requires external support in standing.                            Cognition Arousal/Alertness: Awake/alert Behavior During Therapy: Flat affect;Impulsive Overall Cognitive Status: Impaired/Different from baseline Area of Impairment: Attention;Memory;Safety/judgement;Awareness;Following commands;Problem solving                   Current Attention Level: Sustained Memory: Decreased recall of precautions;Decreased short-term memory Following Commands: Follows one step commands with increased time;Follows one step commands inconsistently Safety/Judgement: Decreased awareness of safety;Decreased awareness of deficits Awareness: Intellectual Problem Solving: Slow processing;Difficulty sequencing;Requires verbal cues;Requires tactile cues General Comments: Impulsive; follows commands consistently with increased time and  repetition. Left  inattention.      Exercises      General Comments        Pertinent Vitals/Pain Pain Assessment: No/denies pain    Home Living                      Prior Function            PT Goals (current goals can now be found in the care plan section) Progress towards PT goals: Progressing toward goals    Frequency    Min 3X/week      PT Plan Current plan remains appropriate    Co-evaluation              AM-PAC PT "6 Clicks" Mobility   Outcome Measure  Help needed turning from your back to your side while in a flat bed without using bedrails?: None Help needed moving from lying on your back to sitting on the side of a flat bed without using bedrails?: A Little Help needed moving to and from a bed to a chair (including a wheelchair)?: A Lot Help needed standing up from a chair using your arms (e.g., wheelchair or bedside chair)?: A Lot Help needed to walk in hospital room?: A Lot Help needed climbing 3-5 steps with a railing? : A Lot 6 Click Score: 15    End of Session Equipment Utilized During Treatment: Gait belt Activity Tolerance: Patient tolerated treatment well Patient left: in chair;with chair alarm set;with call bell/phone within reach Nurse Communication: Mobility status PT Visit Diagnosis: Muscle weakness (generalized) (M62.81);Hemiplegia and hemiparesis;Other abnormalities of gait and mobility (R26.89) Hemiplegia - Right/Left: Left Hemiplegia - dominant/non-dominant: Dominant Hemiplegia - caused by: Cerebral infarction     Time: [id number] PT Time Calculation (min) (ACUTE ONLY): 17 min  Charges:  $Gait Training: 8-22 mins                     Wray Kearns, PT, DPT Acute Rehabilitation Services Pager [phone] Office [place] 05/13/2019, 12:26 PM

## 2019-05-13 NOTE — Progress Notes (Signed)
STROKE TEAM PROGRESS NOTE   Interval history:  Pt sitting in chair, orientated to place and self but not to time. Left hemichorea improved.    Vitals:   05/13/19 0025 05/13/19 0442 05/13/19 0911 05/13/19 1207  BP: 115/80 132/74 124/71 100/73  Pulse: 64 62 73 92  Resp: 17 16 18 18   Temp: 98.2 F (36.8 C) 97.8 F (36.6 C) 98.4 F (36.9 C) 98 F (36.7 C)  TempSrc: Axillary Oral Oral Oral  SpO2: 100% 100% 100% 100%  Weight:      Height:        CBC:  Recent Labs  Lab 05/07/19 1020 05/10/19 0752  WBC 4.3 4.3  HGB 13.6 13.2  HCT 42.4 40.5  MCV 94.6 94.2  PLT 279 [id number]    Basic Metabolic Panel:  Recent Labs  Lab 05/07/19 1020 05/10/19 0752  NA 143 142  K 3.7 4.0  CL 106 107  CO2 25 25  GLUCOSE 144* 87  BUN 14 12  CREATININE 0.81 0.67  CALCIUM 9.8 9.7   IMAGING past 24h No results found.    PHYSICAL EXAM   General- Well nourished, well developed, African male not in distress Ophthalmologic- fundi not visualized due to noncooperation. Lungs clear to auscultation Cardiovascular - Regular rate and rhythm.  Mental Status-  Awake and alert, oriented to self and place, speech is nonfluent and can barely speak a few words and nods appropriately.  Follows simple midline in 1 and occasional two-step commands.  Unable to name and repeat  Cranial Nerves II - XII- II - left dense homonymous hemianopia. III, IV, VI - Extraocular movements intact but right gaze preference with slight limitation of left lateral gaze V - Facial sensation intact bilaterally. VII - Facial movement intact bilaterally. VIII - Hearing & vestibular intact bilaterally. X - Palate elevates symmetrically. XI - Chin turning & shoulder shrug intact bilaterally. XII - Tongue protrusion intact.  Motor Strength -[name] was normal in right sided extremities and mild 4/5 weakness on the left.  Mild weakness of left grip.Bulk was normal and fasciculations were absent.   Motor  Tone- Muscle tone was assessed at the neck and appendages and was normal.  Reflexes-[name] were symmetrical in all extremities andhehad no pathological reflexes.  Sensory-Light touch, temperature/pinprick were assessed and were symmetrical. Coordination impaired left finger-to-nose and knee to heel coordination. Left hemichorea movement while awake, resolved on sleeping  Gait and Station-not tested   ASSESSMENT/PLAN Mr. LATAVIUS MCGILVERY is a 68 y.o. 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   Stroke:   R large PCA infarct s/p IR w/ TICI2b revascularization, secondary to large vessel disease .  Stroke: inhospital Acute/subacute bilateral infarcts (MRI 05/02/2019) due to small vessel disease.   Code Stroke CT head No acute abnormality. Old R frontal infarct.    CTA head & neck - R P1 occlusion. Small BA w/ high grade atheromatous narrowing. L ICA siphon stenosis.  CT perfusion 19 cc penumbra  Cerebral angio TICI 2b revascularization occluded R PCOM and P PCA. Underlying large vessel atherosclerosis   MRI Rt PCA infarct    CT repeat - 04/11/2019 - Evolving R PCA infarct. No progression. No hemorrhage  Repeat MRI w/w/o contrast new acute/subacute infarcts left BG and right cerebellar peduncle 05/02/2019   2D Echo EF 60 to 65%.   Continue tele monitoring, so far no afib found   HIV neg  LDL  101   HgbA1c 9.8  P2Y12 90  Lovenox 40 mg sq daily  for VTE prophylaxis  aspirin 81 mg daily and clopidogrel 75 mg daily prior to admission, put on aspirin 325 mg daily and clopidogrel 75 mg daily. However, developed itchiness, plavix was discontinued. Now on ASA 81 and Brilinta 90 bid. Continue on discharge.  Therapy recommendations:  SNF  Disposition:  Pending  Hx stroke/TIA   01/2016 - admitted for gait disability.  MRI negative for acute infarct.  Old infarcts B BG, thalami, L cerebellum.   11/2018 -  presented for dizziness and unsteady, MRI showed right frontal and parietal small white matter infarcts. No LVO. Extensive SVD.  Put on DAPT.  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 However, pt developed vomiting episodes x 2 days. Improved after off abilify.  Hemichorea movement slowly improving, none seen today  ?? PD with left hand tremor diagnosed in [place] - wife not sure if he was on any medication before.   Cognitive decline, agitation and restless  CT head unchanged; EEG no seizure  Intermittent restless  Did not sleep last night as per RN  seroquel to 50 QHs and 25 q am  increase aricept to 10mg  daily  Hypertension  Home meds:  None listed  Stable 130s  Lotensin 5mg  daily stopped 8/8  . If BP in the 150s, resume lower dose lotensin . BP goal normotensive  UTI, resolved  U/A WBC > 50  IV Rocephin 8/9 x 3 doses - completed  UCx w/ mult morphocytoes, recollection x 2 w/ multiple species  Hyperlipidemia  Home meds:  No statin listed  LDL 101, goal < 70  AST and ALT normalized   Continue Lipitor 40  Continue statin on discharge  Diabetes type II, uncontrolled->stable  Home meds:  glucophage 1000 bid  HgbA1c 9.8, goal < 7.0  On metformin 1000mg  bid from 7/11  CBGs  SSI - DB RN recommended scale adjustment to sensitive 0-9 (done)  Dysphagia, resolved . 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 After hours, contact General Neurology

## 2019-05-13 NOTE — Plan of Care (Signed)
Progressing towards goals

## 2019-05-13 NOTE — Progress Notes (Signed)
Inpatient Diabetes Program Recommendations  AACE/ADA: New Consensus Statement on Inpatient Glycemic Control   Target Ranges:  Prepandial:   less than 140 mg/dL      Peak postprandial:   less than 180 mg/dL (1-2 hours)      Critically ill patients:  140 - 180 mg/dL   Results for ELSWORTH, CASTEEL (MRN [id number]) as of 05/13/2019 10:04  Ref. Range 05/12/2019 06:26 05/12/2019 11:17 05/12/2019 16:22 05/12/2019 17:39 05/12/2019 21:11 05/13/2019 06:12  Glucose-Capillary Latest Ref Range: 70 - 99 mg/dL 124 (H) 134 (H) 46 (L) 101 (H) 87 81   Review of Glycemic Control  Current orders for Inpatient glycemic control: Novolog 0-15 units TID with meals, Metformin 1000 mg BID  Inpatient Diabetes Program Recommendations:   Insulin-Correction: Please consider decreasing Novolog correction to sensitive scale.  Thanks, Barnie Alderman, RN, MSN, CDE Diabetes Coordinator Inpatient Diabetes Program [phone] (Team Pager from 8am to 5pm)

## 2019-05-14 DIAGNOSIS — E875 Hyperkalemia: Secondary | ICD-10-CM

## 2019-05-14 DIAGNOSIS — E162 Hypoglycemia, unspecified: Secondary | ICD-10-CM

## 2019-05-14 LAB — CBC
HCT: 38.8 % — ABNORMAL LOW (ref 39.0–52.0)
Hemoglobin: 12.9 g/dL — ABNORMAL LOW (ref 13.0–17.0)
MCH: 31.3 pg (ref 26.0–34.0)
MCHC: 33.2 g/dL (ref 30.0–36.0)
MCV: 94.2 fL (ref 80.0–100.0)
Platelets: 206 10*3/uL (ref 150–400)
RBC: 4.12 MIL/uL — ABNORMAL LOW (ref 4.22–5.81)
RDW: 13.2 % (ref 11.5–15.5)
WBC: 5.9 10*3/uL (ref 4.0–10.5)
nRBC: 0 % (ref 0.0–0.2)

## 2019-05-14 LAB — BASIC METABOLIC PANEL
Anion gap: 11 (ref 5–15)
BUN: 12 mg/dL (ref 8–23)
CO2: 25 mmol/L (ref 22–32)
Calcium: 9.3 mg/dL (ref 8.9–10.3)
Chloride: 102 mmol/L (ref 98–111)
Creatinine, Ser: 0.68 mg/dL (ref 0.61–1.24)
GFR calc Af Amer: >60 mL/min (ref >60)
GFR calc non Af Amer: >60 mL/min (ref >60)
Glucose, Bld: 111 mg/dL — ABNORMAL HIGH (ref 70–99)
Potassium: 5.4 mmol/L — ABNORMAL HIGH (ref 3.5–5.1)
Sodium: 138 mmol/L (ref 135–145)

## 2019-05-14 LAB — GLUCOSE, CAPILLARY
Glucose-Capillary: 83 mg/dL (ref 70–99)
Glucose-Capillary: 166 mg/dL — ABNORMAL HIGH (ref 70–99)
Glucose-Capillary: 57 mg/dL — ABNORMAL LOW (ref 70–99)
Glucose-Capillary: 99 mg/dL (ref 70–99)
Glucose-Capillary: 99 mg/dL (ref 70–99)

## 2019-05-14 LAB — POTASSIUM: Potassium: 3.8 mmol/L (ref 3.5–5.1)

## 2019-05-14 NOTE — Progress Notes (Signed)
STROKE TEAM PROGRESS NOTE   Interval history:  Pt lying in bed comfortably, no complains. Neuro no change. Lab showed K 5.4, will repeat.    Vitals:   05/14/19 0351 05/14/19 0849 05/14/19 1214 05/14/19 1340  BP: (!) 149/87 (!) 148/84 114/81   Pulse: 73 72 (!) 57   Resp: 17 18 18    Temp: 99.1 F (37.3 C) (!) 97.3 F (36.3 C) 98 F (36.7 C)   TempSrc:  Oral Oral   SpO2: 100% 95% 98%   Weight:    76 kg  Height:    5\' 9"  (1.753 m)    CBC:  Recent Labs  Lab 05/10/19 0752 05/14/19 0915  WBC 4.3 5.9  HGB 13.2 12.9*  HCT 40.5 38.8*  MCV 94.2 94.2  PLT 253 [id number]    Basic Metabolic Panel:  Recent Labs  Lab 05/10/19 0752 05/14/19 0915  NA 142 138  K 4.0 5.4*  CL 107 102  CO2 25 25  GLUCOSE 87 111*  BUN 12 12  CREATININE 0.67 0.68  CALCIUM 9.7 9.3   IMAGING past 24h No results found.    PHYSICAL EXAM    General- Well nourished, well developed, African male not in distress Ophthalmologic- fundi not visualized due to noncooperation. Lungs clear to auscultation Cardiovascular - Regular rate and rhythm.  Mental Status-  Awake and alert, oriented to self and place, speech is nonfluent and can barely speak a few words and nods appropriately.  Follows simple midline in 1 and occasional two-step commands.  Unable to name and repeat  Cranial Nerves II - XII- II - left dense homonymous hemianopia. III, IV, VI - Extraocular movements intact but right gaze preference with slight limitation of left lateral gaze V - Facial sensation intact bilaterally. VII - Facial movement intact bilaterally. VIII - Hearing & vestibular intact bilaterally. X - Palate elevates symmetrically. XI - Chin turning & shoulder shrug intact bilaterally. XII - Tongue protrusion intact.  Motor Strength -[name] was normal in right sided extremities and mild 4/5 weakness on the left.  Mild weakness of left grip.Bulk was normal and fasciculations were absent.   Motor Tone-  Muscle tone was assessed at the neck and appendages and was normal.  Reflexes-[name] were symmetrical in all extremities andhehad no pathological reflexes.  Sensory-Light touch, temperature/pinprick were assessed and were symmetrical. Coordination impaired left finger-to-nose and knee to heel coordination. Left hemichorea movement while awake, resolved on sleeping  Gait and Station-not tested   ASSESSMENT/PLAN [name] is a 68 y.o. 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   Stroke:   R large PCA infarct s/p IR w/ TICI2b revascularization, secondary to large vessel disease .  Stroke: inhospital Acute/subacute bilateral infarcts (MRI 05/02/2019) due to small vessel disease.   Code Stroke CT head No acute abnormality. Old R frontal infarct.    CTA head & neck - R P1 occlusion. Small BA w/ high grade atheromatous narrowing. L ICA siphon stenosis.  CT perfusion 19 cc penumbra  Cerebral angio TICI 2b revascularization occluded R PCOM and P PCA. Underlying large vessel atherosclerosis   MRI Rt PCA infarct    CT repeat - 04/11/2019 - Evolving R PCA infarct. No progression. No hemorrhage  Repeat MRI w/w/o contrast new acute/subacute infarcts left BG and right cerebellar peduncle 05/02/2019   2D Echo EF 60 to 65%.   Continue tele monitoring, so far no afib found  HIV neg  LDL 101   HgbA1c 9.8  P2Y12 90  Lovenox 40 mg sq daily  for VTE prophylaxis  aspirin 81 mg daily and clopidogrel 75 mg daily prior to admission, put on aspirin 325 mg daily and clopidogrel 75 mg daily. However, developed itchiness, plavix was discontinued. Now on ASA 81 and Brilinta 90 bid. Continue on discharge.  Therapy recommendations:  SNF  Disposition:  Pending  Hx stroke/TIA   01/2016 - admitted for gait disability.  MRI negative for acute infarct.  Old infarcts B BG, thalami, L cerebellum.   11/2018 - presented  for dizziness and unsteady, MRI showed right frontal and parietal small white matter infarcts. No LVO. Extensive SVD.  Put on DAPT.  Patient signed out [facility]  05/02/2019: Repeat MRI w/w/o contrast new acute/subacute infarcts left BG and right cerebellar peduncle 05/02/2019, 4-61mm vestibular schwannoma  Left upper extremity involuntary movement  Likely due to right thalamic involvement of current stroke  Semi-involuntary during awake, absent during sleep  Not able to afford tetrabenazine  Put on abilify 5->10mg . However, pt developed vomiting episodes x 2 days. Improved after off abilify.  Hemichorea movement slowly improving, none seen today  ?? PD with left hand tremor diagnosed in [place] - wife not sure if he was on any medication before.   Cognitive decline, agitation and restless  CT head unchanged; EEG no seizure  Intermittent restless  Did not sleep last night as per RN  seroquel to 50 QHs and 25 q am  Now on aricept to 10mg  daily  Hypertension  Home meds:  None listed  Stable 130s  Lotensin 5mg  daily stopped 8/8  . If BP in the 150s, resume lower dose lotensin . BP goal normotensive  UTI, resolved  U/A WBC > 50  IV Rocephin 8/9 x 3 doses - completed  UCx w/ mult morphocytoes, recollection x 2 w/ multiple species  Hyperlipidemia  Home meds:  No statin listed  LDL 101, goal < 70  AST and ALT normalized   Continue Lipitor 40  Continue statin on discharge  Diabetes type II, uncontrolled->stable hypoglycemia  Home meds:  glucophage 1000 bid  HgbA1c 9.8, goal < 7.0  On metformin 1000mg  bid from 7/11, now off due to hypoglycemia  CBGs  SSI  sensitive 0-9   Glucose 46 on 9/7 and 57 on 9/9 - asymptomatic - d/c metformin  Dysphagia, resolved . 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 After hours, contact General Neurology

## 2019-05-14 NOTE — Plan of Care (Signed)
Progressing towards goals

## 2019-05-14 NOTE — Progress Notes (Signed)
Physical Therapy Treatment Patient Details Name: Joe Perry MRN: [id number] DOB: 1951/08/03 Today's Date: 05/14/2019    History of Present Illness 68 yo male s/p  R PCOM and R PCA occlusion s/p emergent thrombectomy on 7/6. Pt admitteed with L sided weakness, R gaze perference, L homonymous hemianopsia. 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 MRI 8/28 showed new acute to subacute infarcts in the left BG and right cerebellar peduncle.    PT Comments    Pt progressing with functional mobility this session, focused on gait training, L attention and L visual scanning throughout session. Pt ambulated within the room with mod assist from therapist, cues for L attention for obstacle avoidance and during turn to sit, cues for gait pattern. Pt will continue to benefit from SNF level follow up therapy in order to address deficits and maximize functional independence with mobility.   Follow Up Recommendations  SNF;Supervision/Assistance - 24 hour     Equipment Recommendations  Rolling walker with 5" wheels;3in1 (PT);Wheelchair (measurements PT);Wheelchair cushion (measurements PT)    Recommendations for Other Services       Precautions / Restrictions Precautions Precautions: Fall;Other (comment) Precaution Comments: Lft lateral lean, Lft hemianopsia, L ftinattention, impulsive Restrictions Weight Bearing Restrictions: No    Mobility  Bed Mobility Overal bed mobility: Needs Assistance Bed Mobility: Supine to Sit Rolling: Min assist   Supine to sit: Min assist;HOB elevated     General bed mobility comments: min assist for trunk elevation, pt brings LEs off the bed without assist during supine>sit  Transfers Overall transfer level: Needs assistance Equipment used: None Transfers: Sit to/from Stand   Stand pivot transfers: Min assist;Mod assist       General transfer comment: Assist to power to standing with cues for hand placement/technique. Encouraged pushing through LUE,  needs manual assist to place left hand on arm rest. Pt reaching towards right side to pull up on rail with BUEs. Stood from [employer], from chair x3.  Ambulation/Gait Ambulation/Gait assistance: Mod assist Gait Distance (Feet): 20 Feet(+10 ft, + 10 ft) Assistive device: None(L UE support around therapist) Gait Pattern/deviations: Step-through pattern;Narrow base of support;Decreased stride length Gait velocity: decreased Gait velocity interpretation: <1.31 ft/sec, indicative of household ambulator General Gait Details: Pt performed x 3 bouts of gait within room this session, seated rest breaks between. Slow, unsteady gait with narrow BoS and left lateral lean; Pt ambulated with L UE around therapist for support, therapist providing facilitation for increased L lateral weightshift, increased cues during ambulation for scanning to L.   Stairs             Wheelchair Mobility    Modified Rankin (Stroke Patients Only) Modified Rankin (Stroke Patients Only) Pre-Morbid Rankin Score: No symptoms Modified Rankin: Moderately severe disability     Balance Overall balance assessment: Needs assistance Sitting-balance support: Feet supported;No upper extremity supported Sitting balance-[name]: Fair Sitting balance - Comments: static sitting EOB with close supervision   Standing balance support: During functional activity Standing balance-[name]: Poor Standing balance comment: requires external support in standing.               High Level Balance Comments: worked on static standing balance without UE support while performing visual scanning task to the L, max cues from therapist for task completion            Cognition Arousal/Alertness: Awake/alert Behavior During Therapy: Flat affect Overall Cognitive Status: Impaired/Different from baseline Area of Impairment: Attention;Memory;Safety/judgement;Awareness;Following commands;Problem solving  Orientation Level: Disoriented to;Time;Situation Current Attention Level: Sustained Memory: Decreased recall of precautions;Decreased short-term memory Following Commands: Follows one step commands with increased time;Follows one step commands inconsistently Safety/Judgement: Decreased awareness of safety;Decreased awareness of deficits Awareness: Intellectual Problem Solving: Slow processing;Difficulty sequencing;Requires verbal cues;Requires tactile cues General Comments: follows commands consistently with increased time and repetition. Left inattention.      Exercises      General Comments        Pertinent Vitals/Pain Pain Assessment: No/denies pain    Home Living                      Prior Function            PT Goals (current goals can now be found in the care plan section) Progress towards PT goals: Progressing toward goals    Frequency    Min 3X/week      PT Plan Current plan remains appropriate    Co-evaluation PT/OT/SLP Co-Evaluation/Treatment: Yes            AM-PAC PT "6 Clicks" Mobility   Outcome Measure  Help needed turning from your back to your side while in a flat bed without using bedrails?: None Help needed moving from lying on your back to sitting on the side of a flat bed without using bedrails?: A Little Help needed moving to and from a bed to a chair (including a wheelchair)?: A Lot Help needed standing up from a chair using your arms (e.g., wheelchair or bedside chair)?: A Lot Help needed to walk in hospital room?: A Lot Help needed climbing 3-5 steps with a railing? : A Lot 6 Click Score: 15    End of Session Equipment Utilized During Treatment: Gait belt Activity Tolerance: Patient tolerated treatment well Patient left: in chair;with chair alarm set;with call bell/phone within reach Nurse Communication: Mobility status PT Visit Diagnosis: Muscle weakness (generalized) (M62.81);Hemiplegia and hemiparesis;Other  abnormalities of gait and mobility (R26.89) Hemiplegia - Right/Left: Left Hemiplegia - dominant/non-dominant: Dominant Hemiplegia - caused by: Cerebral infarction     Time: [id number] PT Time Calculation (min) (ACUTE ONLY): 23 min  Charges:  $Gait Training: 8-22 mins $Neuromuscular Re-education: 8-22 mins                     Netta Corrigan, PT, DPT Acute Rehab Office [place] 05/14/2019, 4:44 PM

## 2019-05-15 LAB — GLUCOSE, CAPILLARY
Glucose-Capillary: 106 mg/dL — ABNORMAL HIGH (ref 70–99)
Glucose-Capillary: 351 mg/dL — ABNORMAL HIGH (ref 70–99)
Glucose-Capillary: 71 mg/dL (ref 70–99)
Glucose-Capillary: 98 mg/dL (ref 70–99)

## 2019-05-15 MED ORDER — QUETIAPINE FUMARATE 25 MG PO TABS
25.0000 mg | ORAL_TABLET | Freq: Two times a day (BID) | ORAL | Status: DC
  Administered 2019-05-15 – 2019-05-29 (×28): 25 mg via ORAL
  Filled 2019-05-15 (×28): qty 1

## 2019-05-15 NOTE — Progress Notes (Signed)
  Speech Language Pathology Treatment: Cognitive-Linquistic  Patient Details Name: Joe Perry MRN: [id number] DOB: 1951/08/22 Today's Date: 05/15/2019 Time: [id number] SLP Time Calculation (min) (ACUTE ONLY): 24 min  Assessment / Plan / Recommendation Clinical Impression  Pt was seen for treatment and was cooperative throughout the session. His verbal output was improved compared to that which was noted during the last session. He was independently oriented to location and city. He was re-oriented to situation and was able to recall it following a 3-minute delay. He was oriented to month and year with min-mod cues for reasoning and he was able to recall these following a 5-minute delay with min cues. He demonstrated 80% accuracy with 3-item immediate recall. He consistently required mod cues during responsive naming. Problem solving continues to be impaired. He frequently placed his legs outside of the bed during the session and consistently required cues to keep them in the bed. When asked what could happen if he continued this behavior he stated, "My feet would not be stable." The SLP inquired as to what this reduced stability could result in and he expressed, " I would fall". Some reduction in cueing for the positioning of his legs was noted following this but this reduction was not sustained likely due to his memory impairments. SLP will continue to follow pt.     HPI HPI: Pt is a 68 year old male admitted 03/10/2019 with left weakness, unsteady gait, right gaze, left hemianopsia. PMH: CVA, HTN, DM. CT negative. MRI Ischemic infarct of the right posterior cerebral artery, territory, roughly corresponding to the ischemic volume. Repeat MRI of 8/28: new acute/subacute infarcts left BG and right cerebellar peduncle      SLP Plan  Continue with current plan of care       Recommendations                   Oral Care Recommendations: Oral care BID Follow up Recommendations: Skilled  Nursing facility SLP Visit Diagnosis: Cognitive communication deficit (R41.841);Aphasia (R47.01);Dysphagia, unspecified (R13.10) Plan: Continue with current plan of care       Trevor Duty I. Hardin Negus, [place], [place] Office number [phone] Pager [place] 05/15/2019, 12:16 PM

## 2019-05-15 NOTE — TOC Progression Note (Addendum)
Transition of Care [facility]) - Progression Note    Patient Details  Name: Joe Perry MRN: [id number] Date of Birth: 1951-02-03  Transition of Care [facility]) CM/SW [place], [place] Phone Number: 05/15/2019, 4:23 PM  Clinical Narrative:   CSW met with patient and wife to review information that was requested from ongoing Medicaid application. Wife was unsure about documents to provide, asked CSW if what she found was enough. CSW explained that the Medicaid is through the county, that [place] is unsure of what she will need and they need to call the social worker at [place] working on the [employer]. Wife agreed, said that she would place everything that she had in the mail because it was due by September 12. Wife attempted to see if patient could assist in filling anything out and patient was unable to write; wife seemed to get slightly agitated with the patient, told him that she knew he could do it and if he didn't want to do anything then it meant that he just wanted to stay in the hospital. CSW attempted to explain to wife that he likely was not able to due to his deficits from the stroke. Wife appreciative of CSW assistance. Wife continues to await information from her son in [place] in order to write a letter to the embassy to see if he can come and help take care of his father.    Expected Discharge Plan: [place] Barriers to Discharge: Continued Medical Work up, SNF Pending Medicaid  Expected Discharge Plan and Services Expected Discharge Plan: [place] In-house Referral: Clinical Social Work Discharge Planning Services: NA Post Acute Care Choice: [place] Living arrangements for the past 2 months: Single Family Home                 DME Arranged: N/A DME Agency: NA     Representative spoke with at DME Agency: na HH Arranged: NA [place] Agency: NA         Social Determinants of Health (SDOH) Interventions    Readmission Risk  Interventions No flowsheet data found.

## 2019-05-15 NOTE — Progress Notes (Signed)
STROKE TEAM PROGRESS NOTE   Interval history:  Pt sitting in bed for lunch, however, lethargic, falling in asleep. Arousable and able to continue eating.   Vitals:   05/15/19 0015 05/15/19 0400 05/15/19 0732 05/15/19 1130  BP: (!) 145/78 (!) 149/87 (!) 145/92 (!) 148/88  Pulse: (!) 55 (!) 53 81 73  Resp: 17 18 18 18   Temp: 98 F (36.7 C) (!) 97.5 F (36.4 C) 97.8 F (36.6 C) 98.3 F (36.8 C)  TempSrc: Axillary Axillary Oral Oral  SpO2: 100% 100% 100% 100%  Weight:      Height:        CBC:  Recent Labs  Lab 05/10/19 0752 05/14/19 0915  WBC 4.3 5.9  HGB 13.2 12.9*  HCT 40.5 38.8*  MCV 94.2 94.2  PLT 253 99991111    Basic Metabolic Panel:  Recent Labs  Lab 05/10/19 0752 05/14/19 0915 05/14/19 1847  NA 142 138  --   K 4.0 5.4* 3.8  CL 107 102  --   CO2 25 25  --   GLUCOSE 87 111*  --   BUN 12 12  --   CREATININE 0.67 0.68  --   CALCIUM 9.7 9.3  --    IMAGING past 24h No results found.    PHYSICAL EXAM     General- Well nourished, well developed, African male not in distress Ophthalmologic- fundi not visualized due to noncooperation. Lungs clear to auscultation Cardiovascular - Regular rate and rhythm.  Mental Status-  lethargic, but oriented to self and place, speech is nonfluent and can barely speak a few words and nods appropriately.  Follows simple midline in 1 and occasional two-step commands.  Unable to name and repeat  Cranial Nerves II - XII- II - left dense homonymous hemianopia. III, IV, VI - Extraocular movements intact but right gaze preference with slight limitation of left lateral gaze V - Facial sensation intact bilaterally. VII - Facial movement intact bilaterally. VIII - Hearing & vestibular intact bilaterally. X - Palate elevates symmetrically. XI - Chin turning & shoulder shrug intact bilaterally. XII - Tongue protrusion intact.  Motor Strength -[name] was normal in right sided extremities and mild 4/5 weakness on  the left.  Mild weakness of left grip.Bulk was normal and fasciculations were absent.   Motor Tone- Muscle tone was assessed at the neck and appendages and was normal.  Reflexes-[name] were symmetrical in all extremities andhehad no pathological reflexes.  Sensory-Light touch, temperature/pinprick were assessed and were symmetrical. Coordination impaired left finger-to-nose and knee to heel coordination. Left hemichorea movement while awake, resolved on sleeping  Gait and Station-not tested   ASSESSMENT/PLAN [name] is a 68 y.o. 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   Stroke:   R large PCA infarct s/p IR w/ TICI2b revascularization, secondary to large vessel disease .  Stroke: inhospital Acute/subacute bilateral infarcts (MRI 05/02/2019) due to small vessel disease.   Code Stroke CT head No acute abnormality. Old R frontal infarct.    CTA head & neck - R P1 occlusion. Small BA w/ high grade atheromatous narrowing. L ICA siphon stenosis.  CT perfusion 19 cc penumbra  Cerebral angio TICI 2b revascularization occluded R PCOM and P PCA. Underlying large vessel atherosclerosis   MRI Rt PCA infarct    CT repeat - 04/11/2019 - Evolving R PCA infarct. No progression. No hemorrhage  Repeat MRI w/w/o contrast new acute/subacute  infarcts left BG and right cerebellar peduncle 05/02/2019   2D Echo EF 60 to 65%.   Continue tele monitoring, so far no afib found   HIV neg  LDL 101   HgbA1c 9.8  P2Y12 90  Lovenox 40 mg sq daily  for VTE prophylaxis  aspirin 81 mg daily and clopidogrel 75 mg daily prior to admission, put on aspirin 325 mg daily and clopidogrel 75 mg daily. However, developed itchiness, plavix was discontinued. Now on ASA 81 and Brilinta 90 bid. Continue on discharge.  Therapy recommendations:  SNF  Disposition:  Pending  Hx stroke/TIA   01/2016 - admitted for gait  disability.  MRI negative for acute infarct.  Old infarcts B BG, thalami, L cerebellum.   11/2018 - presented for dizziness and unsteady, MRI showed right frontal and parietal small white matter infarcts. No LVO. Extensive SVD.  Put on DAPT.  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 However, pt developed vomiting episodes x 2 days. Improved after off abilify.  Hemichorea movement slowly improving, none seen today  ?? PD with left hand tremor diagnosed in [place] - wife not sure if he was on any medication before.   Cognitive decline, agitation and restless  CT head unchanged; EEG no seizure  Intermittent restless  Did not sleep last night as per RN  seroquel decreased to 25 bid given morning lethargy  Now on aricept to 10mg  daily  Hypertension  Home meds:  None listed  Stable 130s  Lotensin 5mg  daily stopped 8/8  . If BP in the 150s, resume lower dose lotensin . BP goal normotensive  UTI, resolved  U/A WBC > 50  IV Rocephin 8/9 x 3 doses - completed  UCx w/ mult morphocytoes, recollection x 2 w/ multiple species  Hyperlipidemia  Home meds:  No statin listed  LDL 101, goal < 70  AST and ALT normalized   Continue Lipitor 40  Continue statin on discharge  Diabetes type II, uncontrolled->stable hypoglycemia  Home meds:  glucophage 1000 bid  HgbA1c 9.8, goal < 7.0  On metformin 1000mg  bid from 7/11, now off due to hypoglycemia  CBGs  SSI  sensitive 0-9   Glucose 46 on 9/7 and 57 on 9/9 - asymptomatic - d/c metformin  Dysphagia, resolved . 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 After hours, contact General Neurology

## 2019-05-16 LAB — GLUCOSE, CAPILLARY
Glucose-Capillary: 112 mg/dL — ABNORMAL HIGH (ref 70–99)
Glucose-Capillary: 127 mg/dL — ABNORMAL HIGH (ref 70–99)
Glucose-Capillary: 111 mg/dL — ABNORMAL HIGH (ref 70–99)
Glucose-Capillary: 114 mg/dL — ABNORMAL HIGH (ref 70–99)

## 2019-05-16 NOTE — Plan of Care (Signed)
  Problem: Education: Goal: Knowledge of disease or condition will improve Outcome: Progressing   Problem: Health Behavior/Discharge Planning: Goal: Ability to manage health-related needs will improve Outcome: Progressing   Problem: Self-Care: Goal: Verbalization of feelings and concerns over difficulty with self-care will improve Outcome: Progressing Goal: Ability to communicate needs accurately will improve Outcome: Progressing   Problem: Nutrition: Goal: Risk of aspiration will decrease Outcome: Progressing   Problem: Ischemic Stroke/TIA Tissue Perfusion: Goal: Complications of ischemic stroke/TIA will be minimized Outcome: Progressing   Problem: Education: Goal: Knowledge of General Education information will improve Description: Including pain rating scale, medication(s)/side effects and non-pharmacologic comfort measures Outcome: Progressing   Problem: Education: Goal: Knowledge of disease or condition will improve Outcome: Progressing Goal: Knowledge of secondary prevention will improve Outcome: Progressing Goal: Knowledge of patient specific risk factors addressed and post discharge goals established will improve Outcome: Progressing Goal: Individualized Educational Video(s) Outcome: Progressing   Problem: Health Behavior/Discharge Planning: Goal: Ability to manage health-related needs will improve Outcome: Progressing   Problem: Self-Care: Goal: Ability to participate in self-care as condition permits will improve Outcome: Progressing Goal: Verbalization of feelings and concerns over difficulty with self-care will improve Outcome: Progressing Goal: Ability to communicate needs accurately will improve Outcome: Progressing   Problem: Nutrition: Goal: Dietary intake will improve Outcome: Progressing   Problem: Ischemic Stroke/TIA Tissue Perfusion: Goal: Complications of ischemic stroke/TIA will be minimized Outcome: Progressing   Ival Bible, BSN, RN

## 2019-05-16 NOTE — Plan of Care (Signed)
Progressing towards discharge 

## 2019-05-16 NOTE — Progress Notes (Signed)
STROKE TEAM PROGRESS NOTE   Interval history:  Pt sitting in chair, feeding himself lunch. He still has left hemianopia, part of the food on the left side of dish was not touched.    Vitals:   05/16/19 0038 05/16/19 0400 05/16/19 0735 05/16/19 1146  BP: 129/64 (!) 160/82 (!) 147/89 107/70  Pulse: 78 77 64 70  Resp: 18 17 16 16   Temp: 98 F (36.7 C) 97.7 F (36.5 C) 97.6 F (36.4 C) (!) 97.5 F (36.4 C)  TempSrc: Oral Oral Oral Oral  SpO2: 100% 100% 100% 100%  Weight:      Height:        CBC:  Recent Labs  Lab 05/10/19 0752 05/14/19 0915  WBC 4.3 5.9  HGB 13.2 12.9*  HCT 40.5 38.8*  MCV 94.2 94.2  PLT 253 99991111    Basic Metabolic Panel:  Recent Labs  Lab 05/10/19 0752 05/14/19 0915 05/14/19 1847  NA 142 138  --   K 4.0 5.4* 3.8  CL 107 102  --   CO2 25 25  --   GLUCOSE 87 111*  --   BUN 12 12  --   CREATININE 0.67 0.68  --   CALCIUM 9.7 9.3  --    IMAGING past 24h No results found.    PHYSICAL EXAM    General- Well nourished, well developed, African male not in distress Ophthalmologic- fundi not visualized due to noncooperation. Lungs clear to auscultation Cardiovascular - Regular rate and rhythm.  Mental Status-  Awake alert oriented to self and place, speech is nonfluent and can barely speak a few words and nods appropriately.  Follows simple midline in 1 and occasional two-step commands.  Unable to name and repeat  Cranial Nerves II - XII- II - left dense homonymous hemianopia. III, IV, VI - Extraocular movements intact but right gaze preference with slight limitation of left lateral gaze V - Facial sensation intact bilaterally. VII - Facial movement intact bilaterally. VIII - Hearing & vestibular intact bilaterally. X - Palate elevates symmetrically. XI - Chin turning & shoulder shrug intact bilaterally. XII - Tongue protrusion intact.  Motor Strength -[name] was normal in right sided extremities and mild 4/5 weakness on  the left.  Mild weakness of left grip.Bulk was normal and fasciculations were absent.   Motor Tone- Muscle tone was assessed at the neck and appendages and was normal.  Reflexes-[name] were symmetrical in all extremities andhehad no pathological reflexes.  Sensory-Light touch, temperature/pinprick were assessed and were symmetrical. Coordination impaired left finger-to-nose and knee to heel coordination. Left hemichorea movement while awake, improved from prior, resolved on sleeping  Gait and Station-not tested   ASSESSMENT/PLAN Joe Perry is a 68 y.o. 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   Stroke:   R large PCA infarct s/p IR w/ TICI2b revascularization, secondary to large vessel disease .  Stroke: inhospital Acute/subacute bilateral infarcts (MRI 05/02/2019) due to small vessel disease.   Code Stroke CT head No acute abnormality. Old R frontal infarct.    CTA head & neck - R P1 occlusion. Small BA w/ high grade atheromatous narrowing. L ICA siphon stenosis.  CT perfusion 19 cc penumbra  Cerebral angio TICI 2b revascularization occluded R PCOM and P PCA. Underlying large vessel atherosclerosis   MRI Rt PCA infarct    CT repeat - 04/11/2019 - Evolving R PCA infarct. No progression. No hemorrhage  Repeat MRI w/w/o contrast new acute/subacute infarcts left BG and right cerebellar peduncle 05/02/2019   2D Echo EF 60 to 65%.   Continue tele monitoring, so far no afib found   HIV neg  LDL 101   HgbA1c 9.8  P2Y12 90  Lovenox 40 mg sq daily  for VTE prophylaxis  aspirin 81 mg daily and clopidogrel 75 mg daily prior to admission, put on aspirin 325 mg daily and clopidogrel 75 mg daily. However, developed itchiness, plavix was discontinued. Now on ASA 81 and Brilinta 90 bid. Continue on discharge.  Therapy recommendations:  SNF  Disposition:  Pending  Hx stroke/TIA   01/2016 -  admitted for gait disability.  MRI negative for acute infarct.  Old infarcts B BG, thalami, L cerebellum.   11/2018 - presented for dizziness and unsteady, MRI showed right frontal and parietal small white matter infarcts. No LVO. Extensive SVD.  Put on DAPT.  Patient signed out [facility]  05/02/2019: Repeat MRI w/w/o contrast new acute/subacute infarcts left BG and right cerebellar peduncle 05/02/2019, 4-53mm vestibular schwannoma  Left upper extremity involuntary movement  Likely due to right thalamic involvement of current stroke  Semi-involuntary during awake, absent during sleep  Not able to afford tetrabenazine  Put on abilify 5->10mg . However, pt developed vomiting episodes x 2 days. Improved after off abilify.  Hemichorea movement slowly improving, none seen today  ?? PD with left hand tremor diagnosed in [place] - wife not sure if he was on any medication before.   Cognitive decline, agitation and restless  CT head unchanged; EEG no seizure  Intermittent restless  seroquel decreased to 25 bid given morning lethargy   Now on aricept to 10mg  daily  Hypertension  Home meds:  None listed  Stable 130s  Lotensin 5mg  daily stopped 8/8  . If BP in the 150s, resume lower dose lotensin . BP goal normotensive  UTI, resolved  U/A WBC > 50  IV Rocephin 8/9 x 3 doses - completed  UCx w/ mult morphocytoes, recollection x 2 w/ multiple species  Hyperlipidemia  Home meds:  No statin listed  LDL 101, goal < 70  AST and ALT normalized   Continue Lipitor 40  Continue statin on discharge  Diabetes type II, uncontrolled->stable hypoglycemia  Home meds:  glucophage 1000 bid  HgbA1c 9.8, goal < 7.0  On metformin 1000mg  bid from 7/11, now off due to hypoglycemia  CBGs  SSI  sensitive 0-9   Glucose 46 on 9/7 and 57 on 9/9 - asymptomatic - d/c metformin  Glucose much improved now.  Dysphagia, resolved . 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 After hours, contact General Neurology

## 2019-05-16 NOTE — Progress Notes (Signed)
Physical Therapy Treatment Patient Details Name: Joe Perry MRN: [id number] DOB: 08-15-1951 Today's Date: 05/16/2019    History of Present Illness 68 yo male s/p  R PCOM and R PCA occlusion s/p emergent thrombectomy on 7/6. Pt admitteed with L sided weakness, R gaze perference, L homonymous hemianopsia. 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 MRI 8/28 showed new acute to subacute infarcts in the left BG and right cerebellar peduncle.    PT Comments    Patient seen for mobility progression. Pt is oriented to place. Pt had BM in chair upon but unaware and required total A for peri care before gait training. Pt requires min/mod A +2 for functional transfer/gait training. Continue to progress as tolerated with anticipated d/c to SNF for further skilled PT services.     Follow Up Recommendations  SNF;Supervision/Assistance - 24 hour     Equipment Recommendations  Rolling walker with 5" wheels;3in1 (PT);Wheelchair (measurements PT);Wheelchair cushion (measurements PT)    Recommendations for Other Services       Precautions / Restrictions Precautions Precautions: Fall Precaution Comments: Lft lateral lean, Lft hemianopsia, L ftinattention, impulsive Restrictions Weight Bearing Restrictions: No    Mobility  Bed Mobility               General bed mobility comments: Pt OOB in chair upon arrival  Transfers Overall transfer level: Needs assistance Equipment used: Rolling walker (2 wheeled) Transfers: Sit to/from Stand Sit to Stand: Mod assist;Min assist         General transfer comment: initial stand from recliner min A to power up and steady and then with mod A pt stood again from recliner and commode with use of grab bar   Ambulation/Gait Ambulation/Gait assistance: Mod assist;+2 safety/equipment Gait Distance (Feet): 24 Feet Assistive device: Rolling walker (2 wheeled) Gait Pattern/deviations: Step-through pattern;Narrow base of support;Decreased stride length Gait  velocity: decreased   General Gait Details: pt with tendency to nearly scissor and increased difficulty with turning espeically to the L; assistance required for balance and guiding RW   Stairs             Wheelchair Mobility    Modified Rankin (Stroke Patients Only) Modified Rankin (Stroke Patients Only) Pre-Morbid Rankin Score: No symptoms Modified Rankin: Moderately severe disability     Balance Overall balance assessment: Needs assistance Sitting-balance support: Feet supported;No upper extremity supported Sitting balance-[name]: Fair     Standing balance support: During functional activity Standing balance-[name]: Poor                              Cognition Arousal/Alertness: Awake/alert Behavior During Therapy: Flat affect;Impulsive Overall Cognitive Status: Impaired/Different from baseline Area of Impairment: Attention;Memory;Safety/judgement;Awareness;Following commands;Problem solving;Orientation                 Orientation Level: Disoriented to;Time;Situation Current Attention Level: Sustained Memory: Decreased recall of precautions;Decreased short-term memory Following Commands: Follows one step commands with increased time;Follows one step commands inconsistently Safety/Judgement: Decreased awareness of safety;Decreased awareness of deficits   Problem Solving: Slow processing;Difficulty sequencing;Requires verbal cues;Requires tactile cues General Comments: pt had BM in chair upon arrival but unaware      Exercises      General Comments        Pertinent Vitals/Pain Pain Assessment: Faces Faces Pain Scale: No hurt    Home Living  Prior Function            PT Goals (current goals can now be found in the care plan section) Progress towards PT goals: Progressing toward goals    Frequency    Min 3X/week      PT Plan Current plan remains appropriate    Co-evaluation               AM-PAC PT "6 Clicks" Mobility   Outcome Measure  Help needed turning from your back to your side while in a flat bed without using bedrails?: None Help needed moving from lying on your back to sitting on the side of a flat bed without using bedrails?: A Little Help needed moving to and from a bed to a chair (including a wheelchair)?: A Lot Help needed standing up from a chair using your arms (e.g., wheelchair or bedside chair)?: A Lot Help needed to walk in hospital room?: A Lot Help needed climbing 3-5 steps with a railing? : A Lot 6 Click Score: 15    End of Session Equipment Utilized During Treatment: Gait belt Activity Tolerance: Patient tolerated treatment well Patient left: in chair;with chair alarm set;with call bell/phone within reach Nurse Communication: Mobility status PT Visit Diagnosis: Muscle weakness (generalized) (M62.81);Hemiplegia and hemiparesis;Other abnormalities of gait and mobility (R26.89) Hemiplegia - Right/Left: Left Hemiplegia - dominant/non-dominant: Dominant Hemiplegia - caused by: Cerebral infarction     Time: 1031-1057 PT Time Calculation (min) (ACUTE ONLY): 26 min  Charges:  $Gait Training: 23-37 mins                     Earney Navy, PTA Acute Rehabilitation Services Pager: [phone] Office: [phone]     Darliss Cheney 05/16/2019, 11:37 AM

## 2019-05-17 LAB — GLUCOSE, CAPILLARY
Glucose-Capillary: 88 mg/dL (ref 70–99)
Glucose-Capillary: 128 mg/dL — ABNORMAL HIGH (ref 70–99)
Glucose-Capillary: 92 mg/dL (ref 70–99)
Glucose-Capillary: 158 mg/dL — ABNORMAL HIGH (ref 70–99)

## 2019-05-17 MED ORDER — AMLODIPINE BESYLATE 5 MG PO TABS
5.0000 mg | ORAL_TABLET | Freq: Every day | ORAL | Status: DC
  Administered 2019-05-17 – 2019-06-03 (×18): 5 mg via ORAL
  Filled 2019-05-17 (×18): qty 1

## 2019-05-17 NOTE — TOC Progression Note (Signed)
Transition of Care [facility]) - Progression Note    Patient Details  Name: Joe Perry MRN: [id number] Date of Birth: 1950/12/12  Transition of [facility]) CM/SW [place], [place] Phone Number: 05/17/2019, 9:08 AM  Clinical Narrative:   CSW following for discharge plan. CSW received call from brother [name] that [place] worker is needing a copy of FL2 for his Medicaid application. CSW printed and scanned to email it to [facility] caseworker Carlyle Basques.     Expected Discharge Plan: [place] Barriers to Discharge: Continued Medical Work up, SNF Pending Medicaid  Expected Discharge Plan and Services Expected Discharge Plan: [place] In-house Referral: Clinical Social Work Discharge Planning Services: NA Post Acute Care Choice: [place] Living arrangements for the past 2 months: Single Family Home                 DME Arranged: N/A DME Agency: NA     Representative spoke with at DME Agency: na HH Arranged: NA [place] Agency: NA         Social Determinants of Health (SDOH) Interventions    Readmission Risk Interventions No flowsheet data found.

## 2019-05-17 NOTE — Progress Notes (Signed)
STROKE TEAM PROGRESS NOTE   Interval history:  Pt lying in bedr,   He still has left hemianopia,. Mild left sided weakness. No changes  Vitals:   05/16/19 2342 05/17/19 0352 05/17/19 0857 05/17/19 1210  BP: (!) 153/72 (!) 160/98 139/83 131/67  Pulse: (!) 59 75 68 (!) 53  Resp: 18 18 16 16   Temp: 98.5 F (36.9 C) 97.9 F (36.6 C) 97.6 F (36.4 C) 97.6 F (36.4 C)  TempSrc: Oral Oral Oral Axillary  SpO2: 100% 98% 97% 99%  Weight:      Height:        CBC:  Recent Labs  Lab 05/14/19 0915  WBC 5.9  HGB 12.9*  HCT 38.8*  MCV 94.2  PLT 99991111    Basic Metabolic Panel:  Recent Labs  Lab 05/14/19 0915 05/14/19 1847  NA 138  --   K 5.4* 3.8  CL 102  --   CO2 25  --   GLUCOSE 111*  --   BUN 12  --   CREATININE 0.68  --   CALCIUM 9.3  --    IMAGING past 24h No results found.    PHYSICAL EXAM    General- Well nourished, well developed, African male not in distress Ophthalmologic- fundi not visualized due to noncooperation. Lungs clear to auscultation Cardiovascular - Regular rate and rhythm.  Mental Status-  Awake alert oriented to self and place, speech is nonfluent and can barely speak a few words and nods appropriately.  Follows simple midline in 1 and occasional two-step commands.  Unable to name and repeat  Cranial Nerves II - XII- II - left dense homonymous hemianopia. III, IV, VI - Extraocular movements intact but right gaze preference with slight limitation of left lateral gaze V - Facial sensation intact bilaterally. VII - Facial movement intact bilaterally. VIII - Hearing & vestibular intact bilaterally. X - Palate elevates symmetrically. XI - Chin turning & shoulder shrug intact bilaterally. XII - Tongue protrusion intact.  Motor Strength -[name] was normal in right sided extremities and mild 4/5 weakness on the left.  Mild weakness of left grip.Bulk was normal and fasciculations were absent.   Motor Tone- Muscle tone was  assessed at the neck and appendages and was normal.  Reflexes-[name] were symmetrical in all extremities andhehad no pathological reflexes.  Sensory-Light touch, temperature/pinprick were assessed and were symmetrical. Coordination impaired left finger-to-nose and knee to heel coordination. Left hemichorea movement while awake, improved from prior, resolved on sleeping  Gait and Station-not tested   ASSESSMENT/PLAN [name] is a 68 y.o. 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   Stroke:   R large PCA infarct s/p IR w/ TICI2b revascularization, secondary to large vessel disease .  Stroke: inhospital Acute/subacute bilateral infarcts (MRI 05/02/2019) due to small vessel disease.   Code Stroke CT head No acute abnormality. Old R frontal infarct.    CTA head & neck - R P1 occlusion. Small BA w/ high grade atheromatous narrowing. L ICA siphon stenosis.  CT perfusion 19 cc penumbra  Cerebral angio TICI 2b revascularization occluded R PCOM and P PCA. Underlying large vessel atherosclerosis   MRI Rt PCA infarct    CT repeat - 04/11/2019 - Evolving R PCA infarct. No progression. No hemorrhage  Repeat MRI w/w/o contrast new acute/subacute infarcts left BG and right cerebellar peduncle 05/02/2019   2D Echo EF 60 to 65%.   Continue  tele monitoring, so far no afib found   HIV neg  LDL 101   HgbA1c 9.8  P2Y12 90  Lovenox 40 mg sq daily  for VTE prophylaxis  aspirin 81 mg daily and clopidogrel 75 mg daily prior to admission, put on aspirin 325 mg daily and clopidogrel 75 mg daily. However, developed itchiness, plavix was discontinued. Now on ASA 81 and Brilinta 90 bid. Continue on discharge.  Therapy recommendations:  SNF  Disposition:  Pending  Hx stroke/TIA   01/2016 - admitted for gait disability.  MRI negative for acute infarct.  Old infarcts B BG, thalami, L cerebellum.   11/2018 -  presented for dizziness and unsteady, MRI showed right frontal and parietal small white matter infarcts. No LVO. Extensive SVD.  Put on DAPT.  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 However, pt developed vomiting episodes x 2 days. Improved after off abilify.  Hemichorea movement slowly improving, none seen today  ?? PD with left hand tremor diagnosed in [place] - wife not sure if he was on any medication before.   Cognitive decline, agitation and restless  CT head unchanged; EEG no seizure  Intermittent restless  seroquel decreased to 25 bid given morning lethargy   Now on aricept to 10mg  daily  Hypertension  Home meds:  None listed  Stable 130s  Lotensin 5mg  daily stopped 8/8  . If BP in the 150s, resume lower dose lotensin -> will add Norvasc since Lotensin may contribute to hyperkalemia . BP goal normotensive  UTI, resolved  U/A WBC > 50  IV Rocephin 8/9 x 3 doses - completed  UCx w/ mult morphocytoes, recollection x 2 w/ multiple species  Hyperlipidemia  Home meds:  No statin listed  LDL 101, goal < 70  AST and ALT normalized   Continue Lipitor 40  Continue statin on discharge  Diabetes type II, uncontrolled->stable hypoglycemia  Home meds:  glucophage 1000 bid  HgbA1c 9.8, goal < 7.0  On metformin 1000mg  bid from 7/11, now off due to hypoglycemia  CBGs  SSI  sensitive 0-9   Glucose 46 on 9/7 and 57 on 9/9 - asymptomatic - d/c metformin  Glucose much improved now.  Dysphagia, resolved . 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  Patient is on difficult to place list for nursing home rehab. Antony Contras, MD  To contact Stroke Continuity provider, please refer to [email or web address] After hours, contact General Neurology

## 2019-05-18 LAB — GLUCOSE, CAPILLARY
Glucose-Capillary: 47 mg/dL — ABNORMAL LOW (ref 70–99)
Glucose-Capillary: 206 mg/dL — ABNORMAL HIGH (ref 70–99)
Glucose-Capillary: 125 mg/dL — ABNORMAL HIGH (ref 70–99)

## 2019-05-18 MED ORDER — GLUCOSE 4 G PO CHEW
CHEWABLE_TABLET | ORAL | Status: AC
  Filled 2019-05-18: qty 1

## 2019-05-18 MED ORDER — GLUCAGON HCL RDNA (DIAGNOSTIC) 1 MG IJ SOLR: 1.0000 mg | INTRAMUSCULAR | Status: AC

## 2019-05-18 MED ORDER — GLUCAGON HCL RDNA (DIAGNOSTIC) 1 MG IJ SOLR
INTRAMUSCULAR | Status: AC
  Administered 2019-05-18: 1 mg
  Filled 2019-05-18: qty 1

## 2019-05-18 NOTE — Progress Notes (Signed)
CBG 47. Alert and oriented to self. Follows command. Given IM Glucagon. Recheck 206.

## 2019-05-18 NOTE — Progress Notes (Signed)
STROKE TEAM PROGRESS NOTE   Interval history:  Pt lying in bedr,   He still has left hemianopia,. Mild left sided weakness. No complaints today  Vitals:   05/17/19 1655 05/17/19 1935 05/17/19 2348 05/18/19 0356  BP: (!) 144/74 124/80 116/74 132/71  Pulse: 74 89 (!) 59 74  Resp: 16 17 15 16   Temp: 98.5 F (36.9 C) 97.9 F (36.6 C) (!) 97.5 F (36.4 C) 97.8 F (36.6 C)  TempSrc: Oral Oral Oral Oral  SpO2: 100% 93% 100% 100%  Weight:      Height:        CBC:  Recent Labs  Lab 05/14/19 0915  WBC 5.9  HGB 12.9*  HCT 38.8*  MCV 94.2  PLT 99991111    Basic Metabolic Panel:  Recent Labs  Lab 05/14/19 0915 05/14/19 1847  NA 138  --   K 5.4* 3.8  CL 102  --   CO2 25  --   GLUCOSE 111*  --   BUN 12  --   CREATININE 0.68  --   CALCIUM 9.3  --    IMAGING past 24h No results found.    PHYSICAL EXAM    General- Well nourished, well developed, African male not in distress Ophthalmologic- fundi not visualized due to noncooperation. Lungs clear to auscultation Cardiovascular - Regular rate and rhythm.  Mental Status-  Awake alert oriented to self and place, speech is nonfluent and can barely speak a few words and nods appropriately.  Follows simple midline in 1 and occasional two-step commands.  Unable to name and repeat  Cranial Nerves II - XII- II - left dense homonymous hemianopia. III, IV, VI - Extraocular movements intact but right gaze preference with slight limitation of left lateral gaze V - Facial sensation intact bilaterally. VII - Facial movement intact bilaterally. VIII - Hearing & vestibular intact bilaterally. X - Palate elevates symmetrically. XI - Chin turning & shoulder shrug intact bilaterally. XII - Tongue protrusion intact.  Motor Strength -[name] was normal in right sided extremities and mild 4/5 weakness on the left.  Mild weakness of left grip.Bulk was normal and fasciculations were absent.   Motor Tone- Muscle tone was  assessed at the neck and appendages and was normal.  Reflexes-[name] were symmetrical in all extremities andhehad no pathological reflexes.  Sensory-Light touch, temperature/pinprick were assessed and were symmetrical. Coordination impaired left finger-to-nose and knee to heel coordination. Left hemichorea movement while awake, improved from prior, resolved on sleeping  Gait and Station-not tested   ASSESSMENT/PLAN Joe Perry is a 68 y.o. 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   Stroke:   R large PCA infarct s/p IR w/ TICI2b revascularization, secondary to large vessel disease .  Stroke: inhospital Acute/subacute bilateral infarcts (MRI 05/02/2019) due to small vessel disease.   Code Stroke CT head No acute abnormality. Old R frontal infarct.    CTA head & neck - R P1 occlusion. Small BA w/ high grade atheromatous narrowing. L ICA siphon stenosis.  CT perfusion 19 cc penumbra  Cerebral angio TICI 2b revascularization occluded R PCOM and P PCA. Underlying large vessel atherosclerosis   MRI Rt PCA infarct    CT repeat - 04/11/2019 - Evolving R PCA infarct. No progression. No hemorrhage  Repeat MRI w/w/o contrast new acute/subacute infarcts left BG and right cerebellar peduncle 05/02/2019   2D Echo EF 60 to 65%.   Continue  tele monitoring, so far no afib found   HIV neg  LDL 101   HgbA1c 9.8  P2Y12 90  Lovenox 40 mg sq daily  for VTE prophylaxis  aspirin 81 mg daily and clopidogrel 75 mg daily prior to admission, put on aspirin 325 mg daily and clopidogrel 75 mg daily. However, developed itchiness, plavix was discontinued. Now on ASA 81 and Brilinta 90 bid. Continue on discharge.  Therapy recommendations:  SNF  Disposition:  Pending  Hx stroke/TIA   01/2016 - admitted for gait disability.  MRI negative for acute infarct.  Old infarcts B BG, thalami, L cerebellum.   11/2018 -  presented for dizziness and unsteady, MRI showed right frontal and parietal small white matter infarcts. No LVO. Extensive SVD.  Put on DAPT.  Patient signed out [facility]  05/02/2019: Repeat MRI w/w/o contrast new acute/subacute infarcts left BG and right cerebellar peduncle 05/02/2019, 4-10mm vestibular schwannoma  Left upper extremity involuntary movement  Likely due to right thalamic involvement of current stroke  Semi-involuntary during awake, absent during sleep  Not able to afford tetrabenazine  Put on abilify 5->10mg . However, pt developed vomiting episodes x 2 days. Improved after off abilify.  Hemichorea movement slowly improving, none seen today  ?? PD with left hand tremor diagnosed in [place] - wife not sure if he was on any medication before.   Cognitive decline, agitation and restless  CT head unchanged; EEG no seizure  Intermittent restless  Seroquel decreased to 25 bid given morning lethargy   Now on aricept to 10mg  daily  Hypertension  Home meds:  None listed  Stable 130s  Lotensin 5mg  daily stopped 8/8  . If BP in the 150s, resume lower dose lotensin -> will add Norvasc since Lotensin may contribute to hyperkalemia -> BP better today 05/18/19 . BP goal normotensive  UTI, resolved  U/A WBC > 50  IV Rocephin 8/9 x 3 doses - completed  UCx w/ mult morphocytoes, recollection x 2 w/ multiple species  Hyperlipidemia  Home meds:  No statin listed  LDL 101, goal < 70  AST and ALT normalized   Continue Lipitor 40  Continue statin on discharge  Diabetes type II, uncontrolled->stable hypoglycemia  Home meds:  glucophage 1000 bid  HgbA1c 9.8, goal < 7.0  On metformin 1000mg  bid from 7/11, now off due to hypoglycemia  CBGs  SSI  sensitive 0-9   Glucose 46 on 9/7 and 57 on 9/9 - asymptomatic - d/c metformin  Glucose much improved now.  Dysphagia, resolved . 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  Patient is on difficult to place list for nursing home rehab. Antony Contras, MD Medical Director [facility] Stroke [facility] Pager: [phone] 05/18/2019 3:09 PM  To contact Stroke Continuity provider, please refer to [email or web address] After hours, contact General Neurology

## 2019-05-19 LAB — GLUCOSE, CAPILLARY
Glucose-Capillary: 63 mg/dL — ABNORMAL LOW (ref 70–99)
Glucose-Capillary: 183 mg/dL — ABNORMAL HIGH (ref 70–99)
Glucose-Capillary: 97 mg/dL (ref 70–99)
Glucose-Capillary: 165 mg/dL — ABNORMAL HIGH (ref 70–99)
Glucose-Capillary: 61 mg/dL — ABNORMAL LOW (ref 70–99)
Glucose-Capillary: 139 mg/dL — ABNORMAL HIGH (ref 70–99)

## 2019-05-19 NOTE — Progress Notes (Signed)
Inpatient Diabetes Program Recommendations  AACE/ADA: New Consensus Statement on Inpatient Glycemic Control (2015)  Target Ranges:  Prepandial:   less than 140 mg/dL      Peak postprandial:   less than 180 mg/dL (1-2 hours)      Critically ill patients:  140 - 180 mg/dL   Lab Results  Component Value Date   GLUCAP 61 (L) 05/19/2019   HGBA1C 9.8 (H) 03/11/2019    Review of Glycemic Control  Diabetes history: DM2 Outpatient Diabetes medications: Metformin listed pt not taking Current orders for Inpatient glycemic control:  Novolog 0-9 units tid  Inpatient Diabetes Program Recommendations:    Hypoglycemia due to insulin being given outside of 1 hour window from when glucose value was obtained. Watch on current regimen.  Nursing:  Please give insulin within 1 hour of CBG.  Thanks,  Tama Headings RN, MSN, BC-ADM Inpatient Diabetes Coordinator Team Pager [phone] (8a-5p)

## 2019-05-19 NOTE — Progress Notes (Signed)
Occupational Therapy Treatment Patient Details Name: Joe Perry MRN: [id number] DOB: [birth year] Today's Date: 05/19/2019    History of present illness 68 yo male s/p  R PCOM and R PCA occlusion s/p emergent thrombectomy on 7/6. Pt admitteed with L sided weakness, R gaze perference, L homonymous hemianopsia. 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 MRI 8/28 showed new acute to subacute infarcts in the left BG and right cerebellar peduncle.   OT comments  Pt progressing toward stated goals, focused session on BADL progression with  L side environment stimulation and cognitive challenge. Pt requiring min-mod A for sit <> stand and transfers. Stood at sink with mod A for balance to brush teeth. Pt needing sequencing and organizational cues, as well has h/h assist to locate water and items in L visual field. Pt perseverates on certain words/phrases throughout session. He shows decreased awareness of safety and impulsiveness with movement. Will continue to follow acutely. D/c recs remain appropriate.   Follow Up Recommendations  SNF;Supervision/Assistance - 24 hour    Equipment Recommendations  Other (comment)(defer to next venue)    Recommendations for Other Services      Precautions / Restrictions Precautions Precautions: Fall Precaution Comments: Lft lateral lean, Lft hemianopsia, L ftinattention, impulsive Restrictions Weight Bearing Restrictions: No       Mobility Bed Mobility               General bed mobility comments: patient in recliner upon arrival  Transfers Overall transfer level: Needs assistance Equipment used: 2 person hand held assist Transfers: Sit to/from Stand Sit to Stand: Min assist;Mod assist         General transfer comment: min-mod A with HHA to maintain upright posture and balance during functional tasks    Balance Overall balance assessment: Needs assistance Sitting-balance support: Feet supported Sitting balance-[name]: Fair Sitting  balance - Comments: requires close supervision for sitting Postural control: Posterior lean;Left lateral lean Standing balance support: Bilateral upper extremity supported;During functional activity Standing balance-[name]: Poor Standing balance comment: requires external support in standing.               High Level Balance Comments: patient stood at sink to brush teeth, requires mod assist to maintain safety and balance. Very narrow BOS and is unable to modify.Heavy use of B UEs on counter to assist with balance.           ADL either performed or assessed with clinical judgement   ADL Overall ADL's : Needs assistance/impaired     Grooming: Moderate assistance;Standing;Oral care Grooming Details (indicate cue type and reason): standing at sink, requiring mod A for locating items in L visual field as well as for sequencing and organization of task. Pt with anterior and L lean, requiring min-mod A to correct.                             Functional mobility during ADLs: Moderate assistance;+2 for physical assistance;+2 for safety/equipment General ADL Comments: continues to require consistent 1 step multimodal cues for organization and sequencing. Pt impulsive and perseverative. Placed BADL items on the L to facilitate tracking     Vision Baseline Vision/History: No visual deficits Vision Assessment?: Vision impaired- to be further tested in functional context Visual Fields: Left visual field deficit Additional Comments: mod-max cues to scan to L   Perception     Praxis      Cognition Arousal/Alertness: Awake/alert Behavior During Therapy:  Impulsive;Flat affect Overall Cognitive Status: Impaired/Different from baseline Area of Impairment: Orientation;Awareness;Attention;Problem solving;Memory;Following commands;Safety/judgement                 Orientation Level: Disoriented to;Place;Time;Situation Current Attention Level: Sustained Memory:  Decreased recall of precautions;Decreased short-term memory Following Commands: Follows one step commands inconsistently;Follows one step commands with increased time Safety/Judgement: Decreased awareness of safety;Decreased awareness of deficits Awareness: Intellectual Problem Solving: Slow processing;Difficulty sequencing;Requires verbal cues;Requires tactile cues General Comments: pt perseverates on certain words/topics. Needs assist in organization and sequencing with multimodal cueing for successful completion        Exercises Other Exercises Other Exercises: L LE LAQ, marching bilaterally with cues and assist. x 10 reps each   Shoulder Instructions       General Comments      Pertinent Vitals/ Pain       Pain Assessment: No/denies pain  Home Living                                          Prior Functioning/Environment              Frequency  Min 2X/week        Progress Toward Goals  OT Goals(current goals can now be found in the care plan section)     Acute Rehab OT Goals Patient Stated Goal: did not state OT Goal Formulation: With patient Time For Goal Achievement: 05/21/19 Potential to Achieve Goals: [place] Discharge plan remains appropriate;Frequency remains appropriate    Co-evaluation    PT/OT/SLP Co-Evaluation/Treatment: Yes Reason for Co-Treatment: For patient/therapist safety;Complexity of the patient's impairments (multi-system involvement);To address functional/ADL transfers;Necessary to address cognition/behavior during functional activity PT goals addressed during session: Mobility/safety with mobility;Balance;Proper use of DME OT goals addressed during session: ADL's and self-care      AM-PAC OT "6 Clicks" Daily Activity     Outcome Measure   Help from another person eating meals?: A Lot Help from another person taking care of personal grooming?: A Lot Help from another person toileting, which includes using  toliet, bedpan, or urinal?: A Lot Help from another person bathing (including washing, rinsing, drying)?: A Lot Help from another person to put on and taking off regular upper body clothing?: A Lot Help from another person to put on and taking off regular lower body clothing?: A Lot 6 Click Score: 12    End of Session Equipment Utilized During Treatment: Gait belt  OT Visit Diagnosis: Cognitive communication deficit (R41.841);Unsteadiness on feet (R26.81) Symptoms and signs involving cognitive functions: Cerebral infarction   Activity Tolerance Patient tolerated treatment well   Patient Left in chair;with call bell/phone within reach;with chair alarm set   Nurse Communication Mobility status        Time: [id number] OT Time Calculation (min): 16 min  Charges: OT General Charges $OT Visit: 1 Visit OT Treatments $Neuromuscular Re-education: 8-22 mins  Zenovia Jarred, MSOT, OTR/L [place] [facility] Office: [place] 05/19/2019, 1:51 PM

## 2019-05-19 NOTE — Progress Notes (Signed)
Physical Therapy Treatment Patient Details Name: Joe Perry MRN: [id number] DOB: 1951/01/24 Today's Date: 05/19/2019    History of Present Illness 68 yo male s/p  R PCOM and R PCA occlusion s/p emergent thrombectomy on 7/6. Pt admitteed with L sided weakness, R gaze perference, L homonymous hemianopsia. 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 MRI 8/28 showed new acute to subacute infarcts in the left BG and right cerebellar peduncle.    PT Comments    Patient received in recliner, mitts donned. Patient alert, pleasant. Unaware of deficits. Patient requires max cues and mod +2 assist for safety with mobility. Ambulated a few feet with +2 hand held assist. And 12 feet with RW. Patient demonstrates flexed posture with RW and is unable to correct despite cues. Heavy reliance on UE support during activities. Very narrow bos with scissoring. Patient is at hight fall risk and will require SNF upon discharge. He will benefit from continued acute PT for strengthening and mobility.       Follow Up Recommendations  SNF;Supervision/Assistance - 24 hour     Equipment Recommendations  Rolling walker with 5" wheels    Recommendations for Other Services       Precautions / Restrictions Precautions Precautions: Fall Precaution Comments: Lft lateral lean, Lft hemianopsia, L ftinattention, impulsive Restrictions Weight Bearing Restrictions: No    Mobility  Bed Mobility               General bed mobility comments: patient in recliner upon arrival  Transfers Overall transfer level: Needs assistance Equipment used: 2 person hand held assist Transfers: Sit to/from Stand Sit to Stand: Min assist;Mod assist         General transfer comment: patient attempting to climb out of chair over foot rest. Required cues and assist to bring legs back up onto foot rest to then sit up safely.  Ambulation/Gait Ambulation/Gait assistance: Mod assist;+2 physical assistance;+2 safety/equipment Gait  Distance (Feet): 25 Feet Assistive device: Rolling walker (2 wheeled);2 person hand held assist Gait Pattern/deviations: Step-to pattern;Narrow base of support;Trunk flexed;Scissoring;Decreased stride length Gait velocity: decreased   General Gait Details: pt with tendency to nearly scissor and increased difficulty with turning espeically to the L; assistance required for balance and guiding RW. Decreased safety and awareness due to L neglect.   Stairs             Wheelchair Mobility    Modified Rankin (Stroke Patients Only) Modified Rankin (Stroke Patients Only) Pre-Morbid Rankin Score: No symptoms Modified Rankin: Moderately severe disability     Balance Overall balance assessment: Needs assistance Sitting-balance support: Feet supported Sitting balance-[name]: Fair Sitting balance - Comments: requires close supervision for sitting   Standing balance support: Bilateral upper extremity supported;During functional activity Standing balance-[name]: Poor                 High Level Balance Comments: patient stood at sink to brush teeth, requires mod assist to maintain safety and balance. Very narrow BOS and is unable to modify.Heavy use of B UEs on counter to assist with balance.            Cognition Arousal/Alertness: Awake/alert Behavior During Therapy: Impulsive;Flat affect Overall Cognitive Status: Impaired/Different from baseline Area of Impairment: Orientation;Awareness;Attention;Problem solving;Memory;Following commands;Safety/judgement                 Orientation Level: Disoriented to;Place;Time;Situation Current Attention Level: Sustained Memory: Decreased recall of precautions;Decreased short-term memory Following Commands: Follows one step commands inconsistently;Follows one step  commands with increased time Safety/Judgement: Decreased awareness of safety;Decreased awareness of deficits Awareness: Intellectual Problem Solving: Slow  processing;Difficulty sequencing;Requires verbal cues;Requires tactile cues        Exercises Other Exercises Other Exercises: L LE LAQ, marching bilaterally with cues and assist. x 10 reps each    General Comments        Pertinent Vitals/Pain Pain Assessment: No/denies pain    Home Living                      Prior Function            PT Goals (current goals can now be found in the care plan section) Acute Rehab PT Goals Patient Stated Goal: did not state PT Goal Formulation: Patient unable to participate in goal setting Time For Goal Achievement: 05/26/19 Potential to Achieve Goals: Fair Progress towards PT goals: Progressing toward goals    Frequency    Min 3X/week      PT Plan Current plan remains appropriate    Co-evaluation PT/OT/SLP Co-Evaluation/Treatment: Yes Reason for Co-Treatment: For patient/therapist safety;Complexity of the patient's impairments (multi-system involvement);Necessary to address cognition/behavior during functional activity;To address functional/ADL transfers PT goals addressed during session: Mobility/safety with mobility;Balance;Proper use of DME        AM-PAC PT "6 Clicks" Mobility   Outcome Measure  Help needed turning from your back to your side while in a flat bed without using bedrails?: None Help needed moving from lying on your back to sitting on the side of a flat bed without using bedrails?: A Little Help needed moving to and from a bed to a chair (including a wheelchair)?: A Lot Help needed standing up from a chair using your arms (e.g., wheelchair or bedside chair)?: A Lot Help needed to walk in hospital room?: A Lot Help needed climbing 3-5 steps with a railing? : Total 6 Click Score: 14    End of Session Equipment Utilized During Treatment: Gait belt Activity Tolerance: Patient tolerated treatment well Patient left: in chair;with call bell/phone within reach;with chair alarm set Nurse Communication:  Mobility status PT Visit Diagnosis: Muscle weakness (generalized) (M62.81);Unsteadiness on feet (R26.81);Difficulty in walking, not elsewhere classified (R26.2);Hemiplegia and hemiparesis Hemiplegia - Right/Left: Left Hemiplegia - dominant/non-dominant: Dominant Hemiplegia - caused by: Cerebral infarction     Time: 1104-1120 PT Time Calculation (min) (ACUTE ONLY): 16 min  Charges:  $Gait Training: 8-22 mins                     [employer], PT, G[facility] 05/19/19,12:27 PM

## 2019-05-19 NOTE — Progress Notes (Signed)
STROKE TEAM PROGRESS NOTE   Interval history:  Pt lying in bed comfortably,   No changes He still has left hemianopia,. Mild left sided weakness. No complaints today  Vitals:   05/19/19 0400 05/19/19 0853 05/19/19 1050 05/19/19 1627  BP: (!) 159/74 127/72 114/81 140/75  Pulse: 69 68 65 67  Resp: 14     Temp: 98.1 F (36.7 C) 98.3 F (36.8 C) (!) 97.5 F (36.4 C) 98.2 F (36.8 C)  TempSrc: Oral Oral Oral Oral  SpO2: 97% 100% 100% 99%  Weight:      Height:        CBC:  Recent Labs  Lab 05/14/19 0915  WBC 5.9  HGB 12.9*  HCT 38.8*  MCV 94.2  PLT 99991111    Basic Metabolic Panel:  Recent Labs  Lab 05/14/19 0915 05/14/19 1847  NA 138  --   K 5.4* 3.8  CL 102  --   CO2 25  --   GLUCOSE 111*  --   BUN 12  --   CREATININE 0.68  --   CALCIUM 9.3  --    IMAGING past 24h No results found.    PHYSICAL EXAM    General- Well nourished, well developed, African male not in distress Ophthalmologic- fundi not visualized due to noncooperation. Lungs clear to auscultation Cardiovascular - Regular rate and rhythm.  Mental Status-  Awake alert oriented to self and place, speech is nonfluent and can barely speak a few words and nods appropriately.  Follows simple midline in 1 and occasional two-step commands.  Unable to name and repeat  Cranial Nerves II - XII- II - left dense homonymous hemianopia. III, IV, VI - Extraocular movements intact but right gaze preference with slight limitation of left lateral gaze V - Facial sensation intact bilaterally. VII - Facial movement intact bilaterally. VIII - Hearing & vestibular intact bilaterally. X - Palate elevates symmetrically. XI - Chin turning & shoulder shrug intact bilaterally. XII - Tongue protrusion intact.  Motor Strength -[name] was normal in right sided extremities and mild 4/5 weakness on the left.  Mild weakness of left grip.Bulk was normal and fasciculations were absent.   Motor Tone-  Muscle tone was assessed at the neck and appendages and was normal.  Reflexes-[name] were symmetrical in all extremities andhehad no pathological reflexes.  Sensory-Light touch, temperature/pinprick were assessed and were symmetrical. Coordination impaired left finger-to-nose and knee to heel coordination. Left hemichorea movement while awake, improved from prior, resolved on sleeping  Gait and Station-not tested   ASSESSMENT/PLAN Joe Perry is a 68 y.o. 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   Stroke:   R large PCA infarct s/p IR w/ TICI2b revascularization, secondary to large vessel disease .  Stroke: inhospital Acute/subacute bilateral infarcts (MRI 05/02/2019) due to small vessel disease.   Code Stroke CT head No acute abnormality. Old R frontal infarct.    CTA head & neck - R P1 occlusion. Small BA w/ high grade atheromatous narrowing. L ICA siphon stenosis.  CT perfusion 19 cc penumbra  Cerebral angio TICI 2b revascularization occluded R PCOM and P PCA. Underlying large vessel atherosclerosis   MRI Rt PCA infarct    CT repeat - 04/11/2019 - Evolving R PCA infarct. No progression. No hemorrhage  Repeat MRI w/w/o contrast new acute/subacute infarcts left BG and right cerebellar peduncle 05/02/2019   2D Echo EF 60 to 65%.  Continue tele monitoring, so far no afib found   HIV neg  LDL 101   HgbA1c 9.8  P2Y12 90  Lovenox 40 mg sq daily  for VTE prophylaxis  aspirin 81 mg daily and clopidogrel 75 mg daily prior to admission, put on aspirin 325 mg daily and clopidogrel 75 mg daily. However, developed itchiness, plavix was discontinued. Now on ASA 81 and Brilinta 90 bid. Continue on discharge.  Therapy recommendations:  SNF  Disposition:  Pending  Hx stroke/TIA   01/2016 - admitted for gait disability.  MRI negative for acute infarct.  Old infarcts B BG, thalami, L cerebellum.    11/2018 - presented for dizziness and unsteady, MRI showed right frontal and parietal small white matter infarcts. No LVO. Extensive SVD.  Put on DAPT.  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 However, pt developed vomiting episodes x 2 days. Improved after off abilify.  Hemichorea movement slowly improving, none seen today  ?? PD with left hand tremor diagnosed in [place] - wife not sure if he was on any medication before.   Cognitive decline, agitation and restless  CT head unchanged; EEG no seizure  Intermittent restless  Seroquel decreased to 25 bid given morning lethargy   Now on aricept to 10mg  daily  Hypertension  Home meds:  None listed  Stable 130s  Lotensin 5mg  daily stopped 8/8  . If BP in the 150s, resume lower dose lotensin -> will add Norvasc since Lotensin may contribute to hyperkalemia -> BP better today 05/18/19 . BP goal normotensive  UTI, resolved  U/A WBC > 50  IV Rocephin 8/9 x 3 doses - completed  UCx w/ mult morphocytoes, recollection x 2 w/ multiple species  Hyperlipidemia  Home meds:  No statin listed  LDL 101, goal < 70  AST and ALT normalized   Continue Lipitor 40  Continue statin on discharge  Diabetes type II, uncontrolled->stable hypoglycemia  Home meds:  glucophage 1000 bid  HgbA1c 9.8, goal < 7.0  On metformin 1000mg  bid from 7/11, now off due to hypoglycemia  CBGs  SSI  sensitive 0-9   Glucose 46 on 9/7 and 57 on 9/9 - asymptomatic - d/c metformin  Glucose much improved now.  Dysphagia, resolved . 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  Patient is on difficult to place list for nursing home rehab. Antony Contras, MD Medical Director [facility] Pager: [phone] 05/19/2019 6:11 PM  To contact Stroke Continuity provider, please refer to [email or web address] After hours, contact General Neurology

## 2019-05-20 LAB — GLUCOSE, CAPILLARY
Glucose-Capillary: 104 mg/dL — ABNORMAL HIGH (ref 70–99)
Glucose-Capillary: 120 mg/dL — ABNORMAL HIGH (ref 70–99)
Glucose-Capillary: 240 mg/dL — ABNORMAL HIGH (ref 70–99)
Glucose-Capillary: 156 mg/dL — ABNORMAL HIGH (ref 70–99)

## 2019-05-20 NOTE — Plan of Care (Signed)
  Problem: Education: Goal: Knowledge of disease or condition will improve Outcome: Progressing   Problem: Health Behavior/Discharge Planning: Goal: Ability to manage health-related needs will improve Outcome: Progressing   Problem: Self-Care: Goal: Verbalization of feelings and concerns over difficulty with self-care will improve Outcome: Progressing Goal: Ability to communicate needs accurately will improve Outcome: Progressing   Problem: Nutrition: Goal: Risk of aspiration will decrease Outcome: Progressing   Problem: Ischemic Stroke/TIA Tissue Perfusion: Goal: Complications of ischemic stroke/TIA will be minimized Outcome: Progressing   Problem: Education: Goal: Knowledge of General Education information will improve Description: Including pain rating scale, medication(s)/side effects and non-pharmacologic comfort measures Outcome: Progressing   Problem: Education: Goal: Knowledge of disease or condition will improve Outcome: Progressing Goal: Knowledge of secondary prevention will improve Outcome: Progressing Goal: Knowledge of patient specific risk factors addressed and post discharge goals established will improve Outcome: Progressing Goal: Individualized Educational Video(s) Outcome: Progressing   Problem: Health Behavior/Discharge Planning: Goal: Ability to manage health-related needs will improve Outcome: Progressing   Problem: Self-Care: Goal: Ability to participate in self-care as condition permits will improve Outcome: Progressing Goal: Verbalization of feelings and concerns over difficulty with self-care will improve Outcome: Progressing Goal: Ability to communicate needs accurately will improve Outcome: Progressing   Problem: Nutrition: Goal: Dietary intake will improve Outcome: Progressing   Problem: Ischemic Stroke/TIA Tissue Perfusion: Goal: Complications of ischemic stroke/TIA will be minimized Outcome: Progressing   Ival Bible, BSN, RN

## 2019-05-20 NOTE — Progress Notes (Signed)
STROKE TEAM PROGRESS NOTE   Interval history:  Pt lying in bed comfortably,   No changes.  Neurological deficits appear unchanged.  Vital signs are stable no complaints today  Vitals:   05/19/19 2345 05/20/19 0339 05/20/19 0821 05/20/19 1056  BP: 107/79 (!) 138/92 (!) 137/112 123/76  Pulse: 84 75 77 (!) 59  Resp: 18 16    Temp: 97.6 F (36.4 C) 97.8 F (36.6 C) (!) 97.4 F (36.3 C) 98.1 F (36.7 C)  TempSrc: Oral Oral Oral Oral  SpO2: 100% 100%  100%  Weight:      Height:        CBC:  Recent Labs  Lab 05/14/19 0915  WBC 5.9  HGB 12.9*  HCT 38.8*  MCV 94.2  PLT 99991111    Basic Metabolic Panel:  Recent Labs  Lab 05/14/19 0915 05/14/19 1847  NA 138  --   K 5.4* 3.8  CL 102  --   CO2 25  --   GLUCOSE 111*  --   BUN 12  --   CREATININE 0.68  --   CALCIUM 9.3  --    IMAGING past 24h No results found.    PHYSICAL EXAM    General- Well nourished, well developed, African male not in distress Ophthalmologic- fundi not visualized due to noncooperation. Lungs clear to auscultation Cardiovascular - Regular rate and rhythm.  Mental Status-  Awake alert oriented to self and place, speech is nonfluent and can barely speak a few words and nods appropriately.  Follows simple midline in 1 and occasional two-step commands.  Unable to name and repeat  Cranial Nerves II - XII- II - left dense homonymous hemianopia. III, IV, VI - Extraocular movements intact but right gaze preference with slight limitation of left lateral gaze V - Facial sensation intact bilaterally. VII - Facial movement intact bilaterally. VIII - Hearing & vestibular intact bilaterally. X - Palate elevates symmetrically. XI - Chin turning & shoulder shrug intact bilaterally. XII - Tongue protrusion intact.  Motor Strength -[name] was normal in right sided extremities and mild 4/5 weakness on the left.  Mild weakness of left grip.Bulk was normal and fasciculations were absent.    Motor Tone- Muscle tone was assessed at the neck and appendages and was normal.  Reflexes-[name] were symmetrical in all extremities andhehad no pathological reflexes.  Sensory-Light touch, temperature/pinprick were assessed and were symmetrical. Coordination impaired left finger-to-nose and knee to heel coordination. Left hemichorea movement while awake, improved from prior, resolved on sleeping  Gait and Station-not tested   ASSESSMENT/PLAN [name] is a 68 y.o. 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   Stroke:   R large PCA infarct s/p IR w/ TICI2b revascularization, secondary to large vessel disease .  Stroke: inhospital Acute/subacute bilateral infarcts (MRI 05/02/2019) due to small vessel disease.   Code Stroke CT head No acute abnormality. Old R frontal infarct.    CTA head & neck - R P1 occlusion. Small BA w/ high grade atheromatous narrowing. L ICA siphon stenosis.  CT perfusion 19 cc penumbra  Cerebral angio TICI 2b revascularization occluded R PCOM and P PCA. Underlying large vessel atherosclerosis   MRI Rt PCA infarct    CT repeat - 04/11/2019 - Evolving R PCA infarct. No progression. No hemorrhage  Repeat MRI w/w/o contrast new acute/subacute infarcts left BG and right cerebellar peduncle 05/02/2019   2D Echo EF 60  to 65%.   Continue tele monitoring, so far no afib found   HIV neg  LDL 101   HgbA1c 9.8  P2Y12 90  Lovenox 40 mg sq daily  for VTE prophylaxis  aspirin 81 mg daily and clopidogrel 75 mg daily prior to admission, put on aspirin 325 mg daily and clopidogrel 75 mg daily. However, developed itchiness, plavix was discontinued. Now on ASA 81 and Brilinta 90 bid. Continue on discharge.  Therapy recommendations:  SNF  Disposition:  Pending  Hx stroke/TIA   01/2016 - admitted for gait disability.  MRI negative for acute infarct.  Old infarcts B BG, thalami,  L cerebellum.   11/2018 - presented for dizziness and unsteady, MRI showed right frontal and parietal small white matter infarcts. No LVO. Extensive SVD.  Put on DAPT.  Patient signed out [facility]  05/02/2019: Repeat MRI w/w/o contrast new acute/subacute infarcts left BG and right cerebellar peduncle 05/02/2019, 4-72mm vestibular schwannoma  Left upper extremity involuntary movement  Likely due to right thalamic involvement of current stroke  Semi-involuntary during awake, absent during sleep  Not able to afford tetrabenazine  Put on abilify 5->10mg . However, pt developed vomiting episodes x 2 days. Improved after off abilify.  Hemichorea movement slowly improving, none seen today  ?? PD with left hand tremor diagnosed in [place] - wife not sure if he was on any medication before.   Cognitive decline, agitation and restless  CT head unchanged; EEG no seizure  Intermittent restless  Seroquel decreased to 25 bid given morning lethargy   Now on aricept to 10mg  daily  Hypertension  Home meds:  None listed  Stable 130s  Lotensin 5mg  daily stopped 8/8  . If BP in the 150s, resume lower dose lotensin -> will add Norvasc since Lotensin may contribute to hyperkalemia -> BP better today 05/18/19 . BP goal normotensive  UTI, resolved  U/A WBC > 50  IV Rocephin 8/9 x 3 doses - completed  UCx w/ mult morphocytoes, recollection x 2 w/ multiple species  Hyperlipidemia  Home meds:  No statin listed  LDL 101, goal < 70  AST and ALT normalized   Continue Lipitor 40  Continue statin on discharge  Diabetes type II, uncontrolled->stable hypoglycemia  Home meds:  glucophage 1000 bid  HgbA1c 9.8, goal < 7.0  On metformin 1000mg  bid from 7/11, now off due to hypoglycemia  CBGs  SSI  sensitive 0-9   Glucose 46 on 9/7 and 57 on 9/9 - asymptomatic - d/c metformin  Glucose much improved now.  Dysphagia, resolved . 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  Patient remains on difficult to place list for nursing home rehab.  Discussed with case Freight forwarder. Antony Contras, MD Medical Director [facility] Pager: [phone] 05/20/2019 4:58 PM  To contact Stroke Continuity provider, please refer to [email or web address] After hours, contact General Neurology

## 2019-05-21 LAB — GLUCOSE, CAPILLARY
Glucose-Capillary: 103 mg/dL — ABNORMAL HIGH (ref 70–99)
Glucose-Capillary: 232 mg/dL — ABNORMAL HIGH (ref 70–99)
Glucose-Capillary: 194 mg/dL — ABNORMAL HIGH (ref 70–99)
Glucose-Capillary: 82 mg/dL (ref 70–99)
Glucose-Capillary: 167 mg/dL — ABNORMAL HIGH (ref 70–99)

## 2019-05-21 NOTE — Progress Notes (Signed)
BG 103, glucometer not downloading into pt's record. CN informed.

## 2019-05-21 NOTE — Progress Notes (Signed)
Physical Therapy Treatment Patient Details Name: Joe Perry MRN: [id number] DOB: [birth year] Today's Date: 05/21/2019    History of Present Illness 68 yo male s/p  R PCOM and R PCA occlusion s/p emergent thrombectomy on 7/6. Pt admitteed with L sided weakness, R gaze perference, L homonymous hemianopsia. 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 MRI 8/28 showed new acute to subacute infarcts in the left BG and right cerebellar peduncle.    PT Comments    Pt ambulated 160' with RW and min to mod assist for balance and negotiating obstacles. Pt is high fall risk and has decreased awareness of physical deficits.   Follow Up Recommendations  SNF;Supervision/Assistance - 24 hour     Equipment Recommendations  Rolling walker with 5" wheels    Recommendations for Other Services       Precautions / Restrictions Precautions Precautions: Fall Precaution Comments: Left lateral lean, Left hemianopsia, Left inattention, impulsive Restrictions Weight Bearing Restrictions: No    Mobility  Bed Mobility Overal bed mobility: Needs Assistance Bed Mobility: Supine to Sit     Supine to sit: HOB elevated;Mod assist     General bed mobility comments: mod assist for trunk elevation and to advance BLEs to edge of bed  Transfers Overall transfer level: Needs assistance Equipment used: Rolling walker (2 wheeled) Transfers: Sit to/from Stand Sit to Stand: Mod assist         General transfer comment: Assist to power to standing with cues for hand placement/technique.  Ambulation/Gait Ambulation/Gait assistance: Mod assist Gait Distance (Feet): 160 Feet Assistive device: Rolling walker (2 wheeled)(L UE support around therapist) Gait Pattern/deviations: Step-through pattern;Narrow base of support;Decreased stride length Gait velocity: decreased   General Gait Details: Mod A for balance 2* unsteady gait with narrow BoS and left lateral lean;  increased cues during ambulation for scanning  to L, with verbal cues pt not able to negotiate obstacles with RW.   Stairs             Wheelchair Mobility    Modified Rankin (Stroke Patients Only) Modified Rankin (Stroke Patients Only) Pre-Morbid Rankin Score: No symptoms Modified Rankin: Moderately severe disability     Balance Overall balance assessment: Needs assistance Sitting-balance support: Feet supported Sitting balance-[name]: Fair Sitting balance - Comments: requires close supervision for sitting Postural control: Posterior lean;Left lateral lean Standing balance support: Bilateral upper extremity supported;During functional activity Standing balance-[name]: Poor Standing balance comment: requires external support in standing.                            Cognition Arousal/Alertness: Awake/alert Behavior During Therapy: Impulsive;Flat affect Overall Cognitive Status: Impaired/Different from baseline Area of Impairment: Orientation;Awareness;Attention;Problem solving;Memory;Following commands;Safety/judgement                 Orientation Level: Disoriented to;Place;Time;Situation Current Attention Level: Sustained Memory: Decreased recall of precautions;Decreased short-term memory Following Commands: Follows one step commands inconsistently;Follows one step commands with increased time Safety/Judgement: Decreased awareness of safety;Decreased awareness of deficits Awareness: Intellectual Problem Solving: Slow processing;Difficulty sequencing;Requires verbal cues;Requires tactile cues        Exercises      General Comments        Pertinent Vitals/Pain Pain Assessment: No/denies pain Faces Pain Scale: No hurt    Home Living                      Prior Function  PT Goals (current goals can now be found in the care plan section) Acute Rehab PT Goals Patient Stated Goal: did not state PT Goal Formulation: Patient unable to participate in goal  setting Time For Goal Achievement: 05/26/19 Potential to Achieve Goals: Fair Progress towards PT goals: Progressing toward goals    Frequency    Min 3X/week      PT Plan Current plan remains appropriate    Co-evaluation PT/OT/SLP Co-Evaluation/Treatment: Yes            AM-PAC PT "6 Clicks" Mobility   Outcome Measure  Help needed turning from your back to your side while in a flat bed without using bedrails?: None Help needed moving from lying on your back to sitting on the side of a flat bed without using bedrails?: A Lot Help needed moving to and from a bed to a chair (including a wheelchair)?: A Lot Help needed standing up from a chair using your arms (e.g., wheelchair or bedside chair)?: A Lot Help needed to walk in hospital room?: A Lot Help needed climbing 3-5 steps with a railing? : Total 6 Click Score: 13    End of Session Equipment Utilized During Treatment: Gait belt Activity Tolerance: Patient tolerated treatment well Patient left: in chair;with call bell/phone within reach;with chair alarm set;Other (comment)(telesitter on, fall pads on floor around recliner, requested order for posey belt for use in recliner from RN) Nurse Communication: Mobility status PT Visit Diagnosis: Muscle weakness (generalized) (M62.81);Unsteadiness on feet (R26.81);Difficulty in walking, not elsewhere classified (R26.2);Hemiplegia and hemiparesis Hemiplegia - Right/Left: Left Hemiplegia - dominant/non-dominant: Dominant Hemiplegia - caused by: Cerebral infarction     Time: [id number] PT Time Calculation (min) (ACUTE ONLY): 25 min  Charges:  $Gait Training: 8-22 mins $Therapeutic Activity: 8-22 mins                     Blondell Reveal Kistler PT 05/21/2019  Acute Rehabilitation Services Pager [phone] Office [phone]

## 2019-05-21 NOTE — Progress Notes (Signed)
  Speech Language Pathology Treatment: Cognitive-Linquistic  Patient Details Name: Joe Perry MRN: [id number] DOB: [birth year] Today's Date: 05/21/2019 Time: [id number] SLP Time Calculation (min) (ACUTE ONLY): 12 min  Assessment / Plan / Recommendation Clinical Impression  Pt was seen for treatment and was cooperative. However, his level of alertness was reduced compared to that which was demonstrated when he was last seen by this SLP and the session was ultimately terminated prematurely for this reason. His case was discussed with Sian, RN and she indicated that the pt was agitated this morning and was given Seroquel. Per RN, the pt's level of alertness has been reduced since then. He demonstrated confrontational naming with 50% accuracy increasing to 100% with phonemic cues. He was unable to complete a simple sequencing task or simple reasoning for orientation despite mod-max cues. SLP will continue to follow pt.    HPI HPI: Pt is a 68 year old male admitted 03/10/2019 with left weakness, unsteady gait, right gaze, left hemianopsia. PMH: CVA, HTN, DM. CT negative. MRI Ischemic infarct of the right posterior cerebral artery, territory, roughly corresponding to the ischemic volume. Repeat MRI of 8/28: new acute/subacute infarcts left BG and right cerebellar peduncle      SLP Plan  Continue with current plan of care       Recommendations                   Oral Care Recommendations: Oral care BID Follow up Recommendations: Skilled Nursing facility SLP Visit Diagnosis: Cognitive communication deficit (R41.841);Aphasia (R47.01);Dysphagia, unspecified (R13.10) Plan: Continue with current plan of care       Krissia Schreier I. Hardin Negus, [place], [place] Office number [phone] Pager [place] 05/21/2019, 3:26 PM

## 2019-05-21 NOTE — Plan of Care (Signed)
  Problem: Education: Goal: Knowledge of disease or condition will improve Outcome: Progressing   Problem: Health Behavior/Discharge Planning: Goal: Ability to manage health-related needs will improve Outcome: Progressing   Problem: Self-Care: Goal: Verbalization of feelings and concerns over difficulty with self-care will improve Outcome: Progressing Goal: Ability to communicate needs accurately will improve Outcome: Progressing   Problem: Nutrition: Goal: Risk of aspiration will decrease Outcome: Progressing   Problem: Ischemic Stroke/TIA Tissue Perfusion: Goal: Complications of ischemic stroke/TIA will be minimized Outcome: Progressing   Problem: Education: Goal: Knowledge of General Education information will improve Description: Including pain rating scale, medication(s)/side effects and non-pharmacologic comfort measures Outcome: Progressing   Problem: Education: Goal: Knowledge of disease or condition will improve Outcome: Progressing Goal: Knowledge of secondary prevention will improve Outcome: Progressing Goal: Knowledge of patient specific risk factors addressed and post discharge goals established will improve Outcome: Progressing Goal: Individualized Educational Video(s) Outcome: Progressing   Problem: Health Behavior/Discharge Planning: Goal: Ability to manage health-related needs will improve Outcome: Progressing   Problem: Self-Care: Goal: Ability to participate in self-care as condition permits will improve Outcome: Progressing Goal: Verbalization of feelings and concerns over difficulty with self-care will improve Outcome: Progressing Goal: Ability to communicate needs accurately will improve Outcome: Progressing   Problem: Nutrition: Goal: Dietary intake will improve Outcome: Progressing   Problem: Ischemic Stroke/TIA Tissue Perfusion: Goal: Complications of ischemic stroke/TIA will be minimized Outcome: Progressing   Ival Bible, BSN, RN

## 2019-05-21 NOTE — Progress Notes (Signed)
STROKE TEAM PROGRESS NOTE   Interval history:  Patient is sitting in bedside chair.  He is awake alert cooperative.  Continues to have mild left-sided weakness, left-sided field cut and slight tremor of the left hand.  Vitals:   05/22/19 0003 05/22/19 0355 05/22/19 0735 05/22/19 1114  BP: 128/62 140/85 (!) 142/79 120/69  Pulse: (!) 55 68 62 74  Resp: 17 17 20 16   Temp: 97.7 F (36.5 C) 97.7 F (36.5 C) 98.3 F (36.8 C) 97.9 F (36.6 C)  TempSrc: Oral Oral Oral Oral  SpO2: 100% 100% 100% 99%  Weight:      Height:        CBC:  Recent Labs  Lab 05/22/19 0346  WBC 6.0  HGB 11.6*  HCT 35.2*  MCV 94.1  PLT 123456    Basic Metabolic Panel:  Recent Labs  Lab 05/22/19 0346  NA 137  K 3.9  CL 101  CO2 27  GLUCOSE 134*  BUN 10  CREATININE 0.71  CALCIUM 9.1   IMAGING past 24h No results found.    PHYSICAL EXAM      General- Well nourished, well developed, African male not in distress Ophthalmologic- fundi not visualized due to noncooperation. Lungs clear to auscultation Cardiovascular - Regular rate and rhythm.  Mental Status-  Awake alert oriented to self and place, speech is nonfluent and can barely speak a few words and nods appropriately.  Follows simple midline in 1 and occasional two-step commands.  Unable to name and repeat  Cranial Nerves II - XII- II - left dense homonymous hemianopia. III, IV, VI - Extraocular movements intact but right gaze preference with slight limitation of left lateral gaze V - Facial sensation intact bilaterally. VII - Facial movement intact bilaterally. VIII - Hearing & vestibular intact bilaterally. X - Palate elevates symmetrically. XI - Chin turning & shoulder shrug intact bilaterally. XII - Tongue protrusion intact.  Motor Strength -[name] was normal in right sided extremities and mild 4/5 weakness on the left.  Mild weakness of left grip.Bulk was normal and fasciculations were absent.   Motor  Tone- Muscle tone was assessed at the neck and appendages and was normal.  Reflexes-[name] were symmetrical in all extremities andhehad no pathological reflexes.  Sensory-Light touch, temperature/pinprick were assessed and were symmetrical. Coordination impaired left finger-to-nose and knee to heel coordination. Left hemichorea movement while awake, improved from prior, resolved on sleeping  Gait and Station-not tested   ASSESSMENT/PLAN [name] is a 68 y.o. 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   Stroke:   R large PCA infarct s/p IR w/ TICI2b revascularization, secondary to large vessel disease .  Stroke: inhospital Acute/subacute bilateral infarcts (MRI 05/02/2019) due to small vessel disease.   Code Stroke CT head No acute abnormality. Old R frontal infarct.    CTA head & neck - R P1 occlusion. Small BA w/ high grade atheromatous narrowing. L ICA siphon stenosis.  CT perfusion 19 cc penumbra  Cerebral angio TICI 2b revascularization occluded R PCOM and P PCA. Underlying large vessel atherosclerosis   MRI Rt PCA infarct    CT repeat - 04/11/2019 - Evolving R PCA infarct. No progression. No hemorrhage  Repeat MRI w/w/o contrast new acute/subacute infarcts left BG and right cerebellar peduncle 05/02/2019   2D Echo EF 60 to 65%.   Continue tele monitoring, so far no afib found   HIV neg  LDL 101   HgbA1c 9.8  P2Y12 90  Lovenox 40 mg sq daily  for VTE prophylaxis  aspirin 81 mg daily and clopidogrel 75 mg daily prior to admission, put on aspirin 325 mg daily and clopidogrel 75 mg daily. However, developed itchiness, plavix was discontinued. Now on ASA 81 and Brilinta 90 bid. Continue on discharge.  Therapy recommendations:  SNF  Disposition:  Pending  Hx stroke/TIA   01/2016 - admitted for gait disability.  MRI negative for acute infarct.  Old infarcts B BG, thalami, L  cerebellum.   11/2018 - presented for dizziness and unsteady, MRI showed right frontal and parietal small white matter infarcts. No LVO. Extensive SVD.  Put on DAPT.  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 However, pt developed vomiting episodes x 2 days. Improved after off abilify.  Hemichorea movement slowly improving, none seen today  ?? PD with left hand tremor diagnosed in [place] - wife not sure if he was on any medication before.   Cognitive decline, agitation and restless  CT head unchanged; EEG no seizure  Intermittent restless  Seroquel decreased to 25 bid given morning lethargy   Now on aricept to 10mg  daily  Hypertension  Home meds:  None listed  Lotensin 5mg  daily stopped 8/8  . On Norvasc 5 mg daily since Lotensin may contribute to hyperkalemia  . BP 110-130s . BP goal normotensive  UTI, resolved  U/A WBC > 50  IV Rocephin 8/9 x 3 doses - completed  UCx w/ mult morphocytoes, recollection x 2 w/ multiple species  Hyperlipidemia  Home meds:  No statin listed  LDL 101, goal < 70  AST and ALT normalized   Continue Lipitor 40  Continue statin on discharge  Diabetes type II, uncontrolled->stable hypoglycemia  Home meds:  glucophage 1000 bid  HgbA1c 9.8, goal < 7.0  On metformin 1000mg  bid from 7/11, now off due to hypoglycemia  Again restarted with hypoglycemia  CBGs  SSI  To sensitive 0-9   Glucose 200s  Dysphagia, resolved . 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  Await nursing home bed yet and patient is on difficult to place list Antony Contras, [place] Pager: [phone] 05/22/2019 2:13 PM  To contact Stroke Continuity provider, please refer to [email or web address] After hours, contact General Neurology

## 2019-05-22 LAB — CBC
HCT: 35.2 % — ABNORMAL LOW (ref 39.0–52.0)
Hemoglobin: 11.6 g/dL — ABNORMAL LOW (ref 13.0–17.0)
MCH: 31 pg (ref 26.0–34.0)
MCHC: 33 g/dL (ref 30.0–36.0)
MCV: 94.1 fL (ref 80.0–100.0)
Platelets: 312 10*3/uL (ref 150–400)
RBC: 3.74 MIL/uL — ABNORMAL LOW (ref 4.22–5.81)
RDW: 13.3 % (ref 11.5–15.5)
WBC: 6 10*3/uL (ref 4.0–10.5)
nRBC: 0 % (ref 0.0–0.2)

## 2019-05-22 LAB — GLUCOSE, CAPILLARY
Glucose-Capillary: 91 mg/dL (ref 70–99)
Glucose-Capillary: 238 mg/dL — ABNORMAL HIGH (ref 70–99)
Glucose-Capillary: 52 mg/dL — ABNORMAL LOW (ref 70–99)
Glucose-Capillary: 204 mg/dL — ABNORMAL HIGH (ref 70–99)

## 2019-05-22 LAB — BASIC METABOLIC PANEL
Anion gap: 9 (ref 5–15)
BUN: 10 mg/dL (ref 8–23)
CO2: 27 mmol/L (ref 22–32)
Calcium: 9.1 mg/dL (ref 8.9–10.3)
Chloride: 101 mmol/L (ref 98–111)
Creatinine, Ser: 0.71 mg/dL (ref 0.61–1.24)
GFR calc Af Amer: >60 mL/min (ref >60)
GFR calc non Af Amer: >60 mL/min (ref >60)
Glucose, Bld: 134 mg/dL — ABNORMAL HIGH (ref 70–99)
Potassium: 3.9 mmol/L (ref 3.5–5.1)
Sodium: 137 mmol/L (ref 135–145)

## 2019-05-22 NOTE — Plan of Care (Signed)
Progressing towards goals

## 2019-05-22 NOTE — Progress Notes (Signed)
  Speech Language Pathology Treatment: Cognitive-Linquistic  Patient Details Name: Joe Perry MRN: [id number] DOB: 08-Jan-1951 Today's Date: 05/22/2019 Time: [id number] SLP Time Calculation (min) (ACUTE ONLY): 21 min  Assessment / Plan / Recommendation Clinical Impression  Pt was seen for treatment and was cooperative throughout the session. His ability to maintain alertness was again reduced but he was able to participate in a longer session today and complete more tasks. Verbal and tactile stimulation were required intermittently for pt to maintain alertness. Nursing reported pt's independent and appropriate use of the call light this morning to ask staff about his cell phone. He demonstrated 60% accuracy with 3-item immediate recall increasing to 80% with mod cues. He was oriented to place but not to time despite cues for reasoning. Time management was attempted but pt was unable to accurately identify the time on clocks despite max cues. SLP will continue to follow pt.    HPI HPI: Pt is a 68 year old male admitted 03/10/2019 with left weakness, unsteady gait, right gaze, left hemianopsia. PMH: CVA, HTN, DM. CT negative. MRI Ischemic infarct of the right posterior cerebral artery, territory, roughly corresponding to the ischemic volume. Repeat MRI of 8/28: new acute/subacute infarcts left BG and right cerebellar peduncle      SLP Plan  Continue with current plan of care       Recommendations                   Oral Care Recommendations: Oral care BID Follow up Recommendations: Skilled Nursing facility SLP Visit Diagnosis: Cognitive communication deficit (R41.841);Aphasia (R47.01);Dysphagia, unspecified (R13.10) Plan: Continue with current plan of care       Jeven Topper I. Hardin Negus, [place], [place] Office number [phone] Pager [place] 05/22/2019, 12:30 PM

## 2019-05-22 NOTE — Progress Notes (Signed)
PT Cancellation Note  Patient Details Name: Joe Perry MRN: [id number] DOB: 01/23/1951   Cancelled Treatment:    Reason Eval/Treat Not Completed: Other (comment);Fatigue/lethargy limiting ability to participate.  Pt was attempted x 2 with pt first being in the middle of a meal and then was simply refusing.  Will retry as time and pt allow.   Ramond Dial 05/22/2019, 3:15 PM   Mee Hives, PT MS Acute Rehab Dept. Number: [place]

## 2019-05-22 NOTE — Progress Notes (Signed)
STROKE TEAM PROGRESS NOTE   Interval history:  No changes.  Sitting in the bedside chair.  Interactive.  Follows simple commands.  Has right gaze preference and left and plegia and mild left-sided weakness  Vitals:   05/22/19 0003 05/22/19 0355 05/22/19 0735 05/22/19 1114  BP: 128/62 140/85 (!) 142/79 120/69  Pulse: (!) 55 68 62 74  Resp: 17 17 20 16   Temp: 97.7 F (36.5 C) 97.7 F (36.5 C) 98.3 F (36.8 C) 97.9 F (36.6 C)  TempSrc: Oral Oral Oral Oral  SpO2: 100% 100% 100% 99%  Weight:      Height:        CBC:  Recent Labs  Lab 05/22/19 0346  WBC 6.0  HGB 11.6*  HCT 35.2*  MCV 94.1  PLT 123456    Basic Metabolic Panel:  Recent Labs  Lab 05/22/19 0346  NA 137  K 3.9  CL 101  CO2 27  GLUCOSE 134*  BUN 10  CREATININE 0.71  CALCIUM 9.1   IMAGING past 24h No results found.    PHYSICAL EXAM    General- Well nourished, well developed, African male not in distress Ophthalmologic- fundi not visualized due to noncooperation. Lungs clear to auscultation Cardiovascular - Regular rate and rhythm.  Mental Status-  Awake alert oriented to self and place, speech is nonfluent and can barely speak a few words and nods appropriately. Follows simple midline in 1 and occasional two-step commands. Unable to name and repeat  Cranial Nerves II - XII- II - left dense homonymous hemianopia. III, IV, VI - Extraocular movements intact but right gaze preference with slight limitation of left lateral gaze V - Facial sensation intact bilaterally. VII - Facial movement intact bilaterally. VIII - Hearing & vestibular intact bilaterally. X - Palate elevates symmetrically. XI - Chin turning & shoulder shrug intact bilaterally. XII - Tongue protrusion intact.  Motor Strength -[name] was normal in right sided extremities and mild 4/5 weakness on the left. Mild weakness of left grip.Bulk was normal and fasciculations were absent.   Motor Tone- Muscle tone  was assessed at the neck and appendages and was normal.  Reflexes-[name] were symmetrical in all extremities andhehad no pathological reflexes.  Sensory-Light touch, temperature/pinprick were assessed and were symmetrical. Coordination impaired left finger-to-nose and knee to heel coordination. Left hemichorea movement while awake, improved from prior, resolved on sleeping  Gait and Station-not tested   ASSESSMENT/PLAN [name] is a 68 y.o. 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   Stroke:   R large PCA infarct s/p IR w/ TICI2b revascularization, secondary to large vessel disease .  Stroke: inhospital Acute/subacute bilateral infarcts (MRI 05/02/2019) due to small vessel disease.   Code Stroke CT head No acute abnormality. Old R frontal infarct.    CTA head & neck - R P1 occlusion. Small BA w/ high grade atheromatous narrowing. L ICA siphon stenosis.  CT perfusion 19 cc penumbra  Cerebral angio TICI 2b revascularization occluded R PCOM and P PCA. Underlying large vessel atherosclerosis   MRI Rt PCA infarct    CT repeat - 04/11/2019 - Evolving R PCA infarct. No progression. No hemorrhage  Repeat MRI w/w/o contrast new acute/subacute infarcts left BG and right cerebellar peduncle 05/02/2019   2D Echo EF 60 to 65%.   Continue tele monitoring, so far no afib found   HIV neg  LDL 101   HgbA1c 9.8  P2Y12 90  Lovenox 40 mg sq daily  for VTE prophylaxis  aspirin 81 mg daily and clopidogrel 75 mg daily prior to admission, put on aspirin 325 mg daily and clopidogrel 75 mg daily. However, developed itchiness, plavix was discontinued. Now on ASA 81 and Brilinta 90 bid. Continue on discharge.  Therapy recommendations:  SNF  Disposition:  Pending  Hx stroke/TIA   01/2016 - admitted for gait disability.  MRI negative for acute infarct.  Old infarcts B BG, thalami, L cerebellum.   11/2018 -  presented for dizziness and unsteady, MRI showed right frontal and parietal small white matter infarcts. No LVO. Extensive SVD.  Put on DAPT.  Patient signed out [facility]  05/02/2019: Repeat MRI w/w/o contrast new acute/subacute infarcts left BG and right cerebellar peduncle 05/02/2019, 4-11mm vestibular schwannoma  Left upper extremity involuntary movement  Likely due to right thalamic involvement of current stroke  Semi-involuntary during awake, absent during sleep  Not able to afford tetrabenazine  Put on abilify 5->10mg . However, pt developed vomiting episodes x 2 days. Improved after off abilify.  Hemichorea movement slowly improving, none seen today  ?? PD with left hand tremor diagnosed in [place] - wife not sure if he was on any medication before.   Cognitive decline, agitation and restless  CT head unchanged; EEG no seizure  Intermittent restless  Seroquel decreased to 25 bid given morning lethargy   Now on aricept to 10mg  daily  Hypertension  Home meds:  None listed  Lotensin 5mg  daily stopped 8/8   On Norvasc 5 mg daily since Lotensin may contribute to hyperkalemia   BP 120-140  BP goal normotensive    UTI, resolved  U/A WBC > 50  IV Rocephin 8/9 x 3 doses - completed  UCx w/ mult morphocytoes, recollection x 2 w/ multiple species  Hyperlipidemia  Home meds:  No statin listed  LDL 101, goal < 70  AST and ALT normalized   Continue Lipitor 40  Continue statin on discharge  Diabetes type II, uncontrolled->stable hypoglycemia  Home meds:  glucophage 1000 bid  HgbA1c 9.8, goal < 7.0  Off metformin 1000mg  due to hypoglycemia  CBGs  SSI  sensitive 0-9   Glucoses variable but more stable. Monitor  Dysphagia, resolved . 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  Continue ongoing medical management  Antony Contras, MD Medical Director [facility] Pager: [phone] 05/22/2019 1:45 PM  To contact Stroke Continuity provider, please refer to [email or web address] After hours, contact General Neurology

## 2019-05-23 LAB — GLUCOSE, CAPILLARY
Glucose-Capillary: 161 mg/dL — ABNORMAL HIGH (ref 70–99)
Glucose-Capillary: 108 mg/dL — ABNORMAL HIGH (ref 70–99)
Glucose-Capillary: 272 mg/dL — ABNORMAL HIGH (ref 70–99)
Glucose-Capillary: 137 mg/dL — ABNORMAL HIGH (ref 70–99)
Glucose-Capillary: 128 mg/dL — ABNORMAL HIGH (ref 70–99)

## 2019-05-23 NOTE — Plan of Care (Signed)
Progressing towards goals

## 2019-05-23 NOTE — Progress Notes (Signed)
STROKE TEAM PROGRESS NOTE   Interval history:  No changes.  Lying down comfortably in bed.  Interactive.  Follows simple commands.  Has right gaze preference and left and plegia and mild left-sided weakness  Vitals:   05/23/19 0040 05/23/19 0449 05/23/19 0854 05/23/19 1148  BP: (!) 177/105 (!) 139/107 115/75 114/64  Pulse: 91 84 (!) 103 64  Resp: 18 19 20 16   Temp: 98.2 F (36.8 C) 98.3 F (36.8 C) 97.8 F (36.6 C) 97.8 F (36.6 C)  TempSrc: Oral Oral Oral Oral  SpO2: 99% 99% 96% 96%  Weight:      Height:        CBC:  Recent Labs  Lab 05/22/19 0346  WBC 6.0  HGB 11.6*  HCT 35.2*  MCV 94.1  PLT 123456    Basic Metabolic Panel:  Recent Labs  Lab 05/22/19 0346  NA 137  K 3.9  CL 101  CO2 27  GLUCOSE 134*  BUN 10  CREATININE 0.71  CALCIUM 9.1   IMAGING past 24h No results found.    PHYSICAL EXAM    General- Well nourished, well developed, African male not in distress Ophthalmologic- fundi not visualized due to noncooperation. Lungs clear to auscultation Cardiovascular - Regular rate and rhythm.  Mental Status-  Awake alert oriented to self and place, speech is nonfluent and can barely speak a few words and nods appropriately. Follows simple midline in 1 and occasional two-step commands. Unable to name and repeat  Cranial Nerves II - XII- II - left dense homonymous hemianopia. III, IV, VI - Extraocular movements intact but right gaze preference with slight limitation of left lateral gaze V - Facial sensation intact bilaterally. VII - Facial movement intact bilaterally. VIII - Hearing & vestibular intact bilaterally. X - Palate elevates symmetrically. XI - Chin turning & shoulder shrug intact bilaterally. XII - Tongue protrusion intact.  Motor Strength -[name] was normal in right sided extremities and mild 4/5 weakness on the left. Mild weakness of left grip.Bulk was normal and fasciculations were absent.   Motor Tone- Muscle  tone was assessed at the neck and appendages and was normal.  Reflexes-[name] were symmetrical in all extremities andhehad no pathological reflexes.  Sensory-Light touch, temperature/pinprick were assessed and were symmetrical. Coordination impaired left finger-to-nose and knee to heel coordination. Left hemichorea movement while awake, improved from prior, resolved on sleeping  Gait and Station-not tested   ASSESSMENT/PLAN Joe Perry is a 68 y.o. 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   Stroke:   R large PCA infarct s/p IR w/ TICI2b revascularization, secondary to large vessel disease .  Stroke: inhospital Acute/subacute bilateral infarcts (MRI 05/02/2019) due to small vessel disease.   Code Stroke CT head No acute abnormality. Old R frontal infarct.    CTA head & neck - R P1 occlusion. Small BA w/ high grade atheromatous narrowing. L ICA siphon stenosis.  CT perfusion 19 cc penumbra  Cerebral angio TICI 2b revascularization occluded R PCOM and P PCA. Underlying large vessel atherosclerosis   MRI Rt PCA infarct    CT repeat - 04/11/2019 - Evolving R PCA infarct. No progression. No hemorrhage  Repeat MRI w/w/o contrast new acute/subacute infarcts left BG and right cerebellar peduncle 05/02/2019   2D Echo EF 60 to 65%.   Continue tele monitoring, so far no afib found   HIV neg  LDL 101   HgbA1c 9.8  P2Y12 90  Lovenox 40 mg sq daily  for VTE prophylaxis  aspirin 81 mg daily and clopidogrel 75 mg daily prior to admission, put on aspirin 325 mg daily and clopidogrel 75 mg daily. However, developed itchiness, plavix was discontinued. Now on ASA 81 and Brilinta 90 bid. Continue on discharge.  Therapy recommendations:  SNF  Disposition:  Pending  Hx stroke/TIA   01/2016 - admitted for gait disability.  MRI negative for acute infarct.  Old infarcts B BG, thalami, L cerebellum.   11/2018  - presented for dizziness and unsteady, MRI showed right frontal and parietal small white matter infarcts. No LVO. Extensive SVD.  Put on DAPT.  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 However, pt developed vomiting episodes x 2 days. Improved after off abilify.  Hemichorea movement slowly improving, none seen today  ?? PD with left hand tremor diagnosed in [place] - wife not sure if he was on any medication before.   Cognitive decline, agitation and restless  CT head unchanged; EEG no seizure  Intermittent restless  Seroquel decreased to 25 bid given morning lethargy   Now on aricept to 10mg  daily  Hypertension  Home meds:  None listed  Lotensin 5mg  daily stopped 8/8   On Norvasc 5 mg daily since Lotensin may contribute to hyperkalemia   BP 120-140  BP goal normotensive    UTI, resolved  U/A WBC > 50  IV Rocephin 8/9 x 3 doses - completed  UCx w/ mult morphocytoes, recollection x 2 w/ multiple species  Hyperlipidemia  Home meds:  No statin listed  LDL 101, goal < 70  AST and ALT normalized   Continue Lipitor 40  Continue statin on discharge  Diabetes type II, uncontrolled->stable hypoglycemia  Home meds:  glucophage 1000 bid  HgbA1c 9.8, goal < 7.0  Off metformin 1000mg  due to hypoglycemia  CBGs  SSI  sensitive 0-9   Glucoses variable but more stable. Monitor  Dysphagia, resolved . 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  Continue ongoing medical management  Antony Contras, MD Medical Director [facility] Pager: [phone] 05/23/2019 2:15 PM  To contact Stroke Continuity provider, please refer to [email or web address] After hours, contact General Neurology

## 2019-05-23 NOTE — Progress Notes (Signed)
Pt has not been to sleep. Has been moving in bed all night. Denies pain or itching. Continuous jerking and turning in bed.

## 2019-05-23 NOTE — Progress Notes (Signed)
Physical Therapy Treatment Patient Details Name: Joe Perry MRN: [id number] DOB: May 31, 1951 Today's Date: 05/23/2019    History of Present Illness 69 yo male s/p  R PCOM and R PCA occlusion s/p emergent thrombectomy on 7/6. Pt admitteed with L sided weakness, R gaze perference, L homonymous hemianopsia. 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 MRI 8/28 showed new acute to subacute infarcts in the left BG and right cerebellar peduncle.    PT Comments    Patient seen for mobility progression. Pt requires mod A (+2 for safety) for gait training distance of 150 ft with RW. Pt continues to present with impaired cognition, vision, and balance increasing risk for falls.  Continue to progress as tolerated with anticipated d/c to SNF for further skilled PT services.     Follow Up Recommendations  SNF;Supervision/Assistance - 24 hour     Equipment Recommendations  Rolling walker with 5" wheels    Recommendations for Other Services       Precautions / Restrictions Precautions Precautions: Fall Precaution Comments: Left lateral lean, Left hemianopsia, Left inattention, impulsive Restrictions Weight Bearing Restrictions: No    Mobility  Bed Mobility Overal bed mobility: Needs Assistance Bed Mobility: Supine to Sit     Supine to sit: HOB elevated;Mod assist     General bed mobility comments: assist to bring bilat LE to EOB and to elevate trunk into sitting   Transfers Overall transfer level: Needs assistance Equipment used: Rolling walker (2 wheeled) Transfers: Sit to/from Stand Sit to Stand: Mod assist         General transfer comment: assist to power up into standing and for balance upon standing   Ambulation/Gait Ambulation/Gait assistance: Mod assist;+2 safety/equipment Gait Distance (Feet): 150 Feet Assistive device: Rolling walker (2 wheeled) Gait Pattern/deviations: Step-through pattern;Narrow base of support;Decreased stride length;Staggering left;Staggering  right Gait velocity: decreased   General Gait Details: assistance required for balance and guiding RW; mulitple LOB   Stairs             Wheelchair Mobility    Modified Rankin (Stroke Patients Only) Modified Rankin (Stroke Patients Only) Pre-Morbid Rankin Score: No symptoms Modified Rankin: Moderately severe disability     Balance Overall balance assessment: Needs assistance Sitting-balance support: Feet supported Sitting balance-[name]: Fair Sitting balance - Comments: min A initially due to posterior and L lateral bias but progressed to min guard for safety Postural control: Posterior lean;Left lateral lean Standing balance support: Bilateral upper extremity supported;During functional activity Standing balance-[name]: Poor                              Cognition Arousal/Alertness: Awake/alert Behavior During Therapy: Flat affect Overall Cognitive Status: Impaired/Different from baseline Area of Impairment: Orientation;Attention;Problem solving;Following commands;Safety/judgement                 Orientation Level: Disoriented to;Time;Situation(unable to recall his birthday; didn't ask place) Current Attention Level: Sustained   Following Commands: Follows one step commands inconsistently;Follows one step commands with increased time Safety/Judgement: Decreased awareness of safety;Decreased awareness of deficits   Problem Solving: Slow processing;Difficulty sequencing;Requires verbal cues;Requires tactile cues        Exercises      General Comments        Pertinent Vitals/Pain Pain Assessment: No/denies pain    Home Living                      Prior  Function            PT Goals (current goals can now be found in the care plan section) Progress towards PT goals: Progressing toward goals    Frequency    Min 3X/week      PT Plan Current plan remains appropriate    Co-evaluation               AM-PAC PT "6 Clicks" Mobility   Outcome Measure  Help needed turning from your back to your side while in a flat bed without using bedrails?: None Help needed moving from lying on your back to sitting on the side of a flat bed without using bedrails?: A Lot Help needed moving to and from a bed to a chair (including a wheelchair)?: A Lot Help needed standing up from a chair using your arms (e.g., wheelchair or bedside chair)?: A Lot Help needed to walk in hospital room?: A Lot Help needed climbing 3-5 steps with a railing? : A Lot 6 Click Score: 14    End of Session Equipment Utilized During Treatment: Gait belt Activity Tolerance: Patient tolerated treatment well Patient left: with call bell/phone within reach;in bed;with bed alarm set(telesitter) Nurse Communication: Mobility status PT Visit Diagnosis: Muscle weakness (generalized) (M62.81);Unsteadiness on feet (R26.81);Difficulty in walking, not elsewhere classified (R26.2);Hemiplegia and hemiparesis Hemiplegia - Right/Left: Left Hemiplegia - dominant/non-dominant: Dominant Hemiplegia - caused by: Cerebral infarction     Time: [id number] PT Time Calculation (min) (ACUTE ONLY): 19 min  Charges:  $Gait Training: 8-22 mins                     Earney Navy, PTA Acute Rehabilitation Services Pager: [phone] Office: [phone]     Darliss Cheney 05/23/2019, 4:16 PM

## 2019-05-24 LAB — GLUCOSE, CAPILLARY
Glucose-Capillary: 95 mg/dL (ref 70–99)
Glucose-Capillary: 246 mg/dL — ABNORMAL HIGH (ref 70–99)
Glucose-Capillary: 210 mg/dL — ABNORMAL HIGH (ref 70–99)
Glucose-Capillary: 62 mg/dL — ABNORMAL LOW (ref 70–99)
Glucose-Capillary: 85 mg/dL (ref 70–99)

## 2019-05-24 NOTE — TOC Progression Note (Signed)
LATE NOTE SUBMISSION    Transition of Care [facility]) - Progression Note    Patient Details  Name: Joe Perry MRN: [id number] Date of Birth: May 18, 1951  Transition of Care [facility]) CM/SW [place], [place] Phone Number: 05/24/2019, 2:24 PM  Clinical Narrative:  CSW contacted by patient's wife that she was at the hospital and wanted to see CSW. CSW met with wife, who provided letter that was received in the mail that the patient was denied Medicaid. Per the letter, it indicates that the office did not receive all of the information from the family. Wife broke down in tears, saying that she has been trying so hard to do what she needs to do but she doesn't understand and she can't get any information. Wife discussed how she knows how things work in her own country but nothing in [place] makes sense to her, and she feels alone and far from home and like no one is helping her. CSW listened and provided her reassurance that we could figure it out. CSW made a copy of the letter and told the wife that she would reach out to [place] and the [facility] tomorrow.      Expected Discharge Plan: [place] Barriers to Discharge: Continued Medical Work up, SNF Pending Medicaid  Expected Discharge Plan and Services Expected Discharge Plan: [place] In-house Referral: Clinical Social Work Discharge Planning Services: NA Post Acute Care Choice: [place] Living arrangements for the past 2 months: Single Family Home                 DME Arranged: N/A DME Agency: NA     Representative spoke with at DME Agency: na HH Arranged: NA [place] Agency: NA         Social Determinants of Health (SDOH) Interventions    Readmission Risk Interventions No flowsheet data found.

## 2019-05-24 NOTE — Plan of Care (Signed)
Progressing towards goals

## 2019-05-24 NOTE — Plan of Care (Signed)
  Problem: Education: Goal: Knowledge of General Education information will improve Description Including pain rating scale, medication(s)/side effects and non-pharmacologic comfort measures Outcome: Progressing   

## 2019-05-24 NOTE — Progress Notes (Signed)
STROKE TEAM PROGRESS NOTE   Interval history:  No changes. Sitting in bed for lunch, feeding himself, ate mainly the right side food in dish.  Interactive.  Follows some simple commands.  Has right gaze preference and left hemianopia and mild left-sided weakness.  Vitals:   05/23/19 1941 05/23/19 2315 05/24/19 0857 05/24/19 1239  BP: (!) 143/104 135/84 (!) 141/90 (!) 134/92  Pulse: 89 75 77 100  Resp: 17 17 16 16   Temp: 98.5 F (36.9 C) 98 F (36.7 C) 98 F (36.7 C) 98 F (36.7 C)  TempSrc: Oral Oral Oral Oral  SpO2: 100% 100% 100% 94%  Weight:      Height:        CBC:  Recent Labs  Lab 05/22/19 0346  WBC 6.0  HGB 11.6*  HCT 35.2*  MCV 94.1  PLT 123456    Basic Metabolic Panel:  Recent Labs  Lab 05/22/19 0346  NA 137  K 3.9  CL 101  CO2 27  GLUCOSE 134*  BUN 10  CREATININE 0.71  CALCIUM 9.1   IMAGING past 24h No results found.    PHYSICAL EXAM    General- Well nourished, well developed, African male not in distress Ophthalmologic- fundi not visualized due to noncooperation. Lungs clear to auscultation Cardiovascular - Regular rate and rhythm.  Mental Status-  Awake alert oriented to self and place, speech is nonfluent and can barely speak a few words and nods appropriately. Follows simple midline in 1 and occasional two-step commands. Unable to name and repeat  Cranial Nerves II - XII- II - left dense homonymous hemianopia. III, IV, VI - Extraocular movements intact but right gaze preference with slight limitation of left lateral gaze V - Facial sensation intact bilaterally. VII - Facial movement intact bilaterally. VIII - Hearing & vestibular intact bilaterally. X - Palate elevates symmetrically. XI - Chin turning & shoulder shrug intact bilaterally. XII - Tongue protrusion intact.  Motor Strength -[name] was normal in right sided extremities and mild 4/5 weakness on the left. Mild weakness of left grip.Bulk was normal and  fasciculations were absent.   Motor Tone- Muscle tone was assessed at the neck and appendages and was normal.  Reflexes-[name] were symmetrical in all extremities andhehad no pathological reflexes.  Sensory-Light touch, temperature/pinprick were assessed and were symmetrical. Coordination impaired left finger-to-nose and knee to heel coordination. Left hemichorea movement while awake, improved from prior, resolved on sleeping  Gait and Station-not tested   ASSESSMENT/PLAN [name] is a 68 y.o. 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   Stroke:   R large PCA infarct s/p IR w/ TICI2b revascularization, secondary to large vessel disease .  Stroke: inhospital Acute/subacute bilateral infarcts (MRI 05/02/2019) due to small vessel disease.   Code Stroke CT head No acute abnormality. Old R frontal infarct.    CTA head & neck - R P1 occlusion. Small BA w/ high grade atheromatous narrowing. L ICA siphon stenosis.  CT perfusion 19 cc penumbra  Cerebral angio TICI 2b revascularization occluded R PCOM and P PCA. Underlying large vessel atherosclerosis   MRI Rt PCA infarct    CT repeat - 04/11/2019 - Evolving R PCA infarct. No progression. No hemorrhage  Repeat MRI w/w/o contrast new acute/subacute infarcts left BG and right cerebellar peduncle 05/02/2019   2D Echo EF 60 to 65%.   Continue tele monitoring, so far no afib found  HIV neg  LDL 101   HgbA1c 9.8  P2Y12 90  Lovenox 40 mg sq daily  for VTE prophylaxis  aspirin 81 mg daily and clopidogrel 75 mg daily prior to admission, put on aspirin 325 mg daily and clopidogrel 75 mg daily. However, developed itchiness, plavix was discontinued. Now on ASA 81 and Brilinta 90 bid. Continue on discharge.  Therapy recommendations:  SNF  Disposition:  Pending  Hx stroke/TIA   01/2016 - admitted for gait disability.  MRI negative for acute infarct.   Old infarcts B BG, thalami, L cerebellum.   11/2018 - presented for dizziness and unsteady, MRI showed right frontal and parietal small white matter infarcts. No LVO. Extensive SVD.  Put on DAPT.  Patient signed out AMA  Left upper extremity involuntary movement  Likely due to right thalamic involvement of current stroke  Semi-involuntary during awake, absent during sleep  Not able to afford tetrabenazine  Put on abilify 5->10mg . However, pt developed vomiting episodes x 2 days. Improved after off abilify.  Hemichorea movement slowly improving  Cognitive decline, agitation and restless  CT head unchanged; EEG no seizure  Intermittent restless  Seroquel 25 bid  Now on aricept to 10mg  daily  Hypertension  Home meds:  None listed  Lotensin 5mg  daily stopped 8/8   On Norvasc 5 mg daily  BP 120-140  BP goal normotensive    UTI, resolved  U/A WBC > 50  IV Rocephin 8/9 x 3 doses - completed  UCx w/ mult morphocytoes, recollection x 2 w/ multiple species  Hyperlipidemia  Home meds:  No statin listed  LDL 101, goal < 70  AST and ALT normalized   Continue Lipitor 40  Continue statin on discharge  Diabetes type II, uncontrolled->stable hypoglycemia  Home meds:  glucophage 1000 bid  HgbA1c 9.8, goal < 7.0  Off metformin 1000mg  due to hypoglycemia  CBGs  SSI  sensitive 0-9   Glucoses continues to fluctuate, but no hypoglycemia  Dysphagia, resolved . On D1 thin liquids, meds in puree  Other Stroke Risk Factors  Advanced age  Overweight, Body mass index is 24.74 kg/m., recommend weight loss, diet and exercise as appropriate   No driving due to hemianopia, has discussed with wife   Other Active Problems  Constipation, abd xray ok, put on senokot, prn bisacodyl and fleets  N/V - resolved  Chronic stuttering - as per wife  Hypokalemia 3.3 ->5.4 -> 3.8  4-3mm vestibular schwannoma, needs outpatient follow up  Hospital day # 55   [name], MD PhD Stroke Neurology 05/24/2019 2:14 PM     To contact Stroke Continuity provider, please refer to [email or web address] After hours, contact General Neurology

## 2019-05-24 NOTE — TOC Progression Note (Signed)
Transition of Care [facility]) - Progression Note    Patient Details  Name: Joe Perry MRN: [id number] Date of Birth: [birth year]  Transition of Care [facility]) CM/SW [place], [place] Phone Number: 05/24/2019, 2:27 PM  Clinical Narrative:  CSW spoke with [name], patient's brother, that he had spoken with the Medicaid worker and that the patient was denied because he has slightly over $2,000 in his account, and he needs to have less than that. [name] said that the [facility] worker recommended that the patient's wife obtain a power of attorney over the patient so she can access his banking accounts to spend some of his money and then try again. [name] also discussed how [name] is becoming frustrated with waiting for rehab for the patient and is now out of work, wondering if patient could be evaluated for CIR again now that [name] is home. CSW to request CIR to look at patient again.     Expected Discharge Plan: [place] Barriers to Discharge: Continued Medical Work up, SNF Pending Medicaid  Expected Discharge Plan and Services Expected Discharge Plan: [place] In-house Referral: Clinical Social Work Discharge Planning Services: NA Post Acute Care Choice: [place] Living arrangements for the past 2 months: Single Family Home                 DME Arranged: N/A DME Agency: NA     Representative spoke with at DME Agency: na HH Arranged: NA [place] Agency: NA         Social Determinants of Health (SDOH) Interventions    Readmission Risk Interventions No flowsheet data found.

## 2019-05-25 LAB — GLUCOSE, CAPILLARY
Glucose-Capillary: 90 mg/dL (ref 70–99)
Glucose-Capillary: 213 mg/dL — ABNORMAL HIGH (ref 70–99)
Glucose-Capillary: 239 mg/dL — ABNORMAL HIGH (ref 70–99)
Glucose-Capillary: 155 mg/dL — ABNORMAL HIGH (ref 70–99)

## 2019-05-25 NOTE — Progress Notes (Signed)
STROKE TEAM PROGRESS NOTE   Interval history:  Wife at bedside.  Patient sitting in bedside commode for bowel movement.  No neuro changes.  Vitals:   05/24/19 1239 05/24/19 1954 05/24/19 2352 05/25/19 0309  BP: (!) 134/92 (!) 158/100 140/85 (!) 158/95  Pulse: 100 98 87 78  Resp: 16 18 16 20   Temp: 98 F (36.7 C) 98.7 F (37.1 C) (!) 97.5 F (36.4 C) (!) 97.5 F (36.4 C)  TempSrc: Oral Oral Oral Oral  SpO2: 94% 98% 100% 98%  Weight:      Height:        CBC:  Recent Labs  Lab 05/22/19 0346  WBC 6.0  HGB 11.6*  HCT 35.2*  MCV 94.1  PLT 123456    Basic Metabolic Panel:  Recent Labs  Lab 05/22/19 0346  NA 137  K 3.9  CL 101  CO2 27  GLUCOSE 134*  BUN 10  CREATININE 0.71  CALCIUM 9.1   IMAGING past 24h No results found.    PHYSICAL EXAM    General- Well nourished, well developed, African male not in distress Ophthalmologic- fundi not visualized due to noncooperation. Lungs clear to auscultation Cardiovascular - Regular rate and rhythm.  Mental Status-  Awake alert oriented to self and place, speech is nonfluent and can barely speak a few words and nods appropriately. Follows simple midline in 1 and occasional two-step commands. Unable to name and repeat  Cranial Nerves II - XII- II - left dense homonymous hemianopia. III, IV, VI - Extraocular movements intact but right gaze preference with slight limitation of left lateral gaze V - Facial sensation intact bilaterally. VII - Facial movement intact bilaterally. VIII - Hearing & vestibular intact bilaterally. X - Palate elevates symmetrically. XI - Chin turning & shoulder shrug intact bilaterally. XII - Tongue protrusion intact.  Motor Strength -[name] was normal in right sided extremities and mild 4/5 weakness on the left. Mild weakness of left grip.Bulk was normal and fasciculations were absent.   Motor Tone- Muscle tone was assessed at the neck and appendages and was  normal.  Reflexes-[name] were symmetrical in all extremities andhehad no pathological reflexes.  Sensory-Light touch, temperature/pinprick were assessed and were symmetrical. Coordination impaired left finger-to-nose and knee to heel coordination. Left hemichorea movement while awake, improved from prior, resolved on sleeping  Gait and Station-not tested   ASSESSMENT/PLAN Joe Perry is a 68 y.o. 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   Stroke:   R large PCA infarct s/p IR w/ TICI2b revascularization, secondary to large vessel disease .  Stroke: inhospital Acute/subacute bilateral infarcts (MRI 05/02/2019) due to small vessel disease.   Code Stroke CT head No acute abnormality. Old R frontal infarct.    CTA head & neck - R P1 occlusion. Small BA w/ high grade atheromatous narrowing. L ICA siphon stenosis.  CT perfusion 19 cc penumbra  Cerebral angio TICI 2b revascularization occluded R PCOM and P PCA. Underlying large vessel atherosclerosis   MRI Rt PCA infarct    CT repeat - 04/11/2019 - Evolving R PCA infarct. No progression. No hemorrhage  Repeat MRI w/w/o contrast new acute/subacute infarcts left BG and right cerebellar peduncle 05/02/2019   2D Echo EF 60 to 65%.   Continue tele monitoring, so far no afib found   HIV neg  LDL 101   HgbA1c 9.8  P2Y12 90  Lovenox 40 mg sq daily  for VTE prophylaxis  aspirin 81 mg daily and clopidogrel 75 mg daily prior to admission, put on aspirin 325 mg daily and clopidogrel 75 mg daily. However, developed itchiness, plavix was discontinued. Now on ASA 81 and Brilinta 90 bid. Continue on discharge.  Therapy recommendations:  SNF  Disposition:  Pending  Hx stroke/TIA   01/2016 - admitted for gait disability.  MRI negative for acute infarct.  Old infarcts B BG, thalami, L cerebellum.   11/2018 - presented for dizziness and unsteady, MRI showed  right frontal and parietal small white matter infarcts. No LVO. Extensive SVD.  Put on DAPT.  Patient signed out AMA  Left upper extremity involuntary movement  Likely due to right thalamic involvement of current stroke  Semi-involuntary during awake, absent during sleep  Not able to afford tetrabenazine  Put on abilify 5->10mg . However, pt developed vomiting episodes x 2 days. Improved after off abilify.  Hemichorea movement slowly improving  Cognitive decline, agitation and restless  CT head unchanged; EEG no seizure  Intermittent restless  Seroquel 25 bid  Now on aricept to 10mg  daily  Hypertension  Home meds:  None listed  Lotensin 5mg  daily stopped 8/8   On Norvasc 5 mg daily  BP 120-140  BP goal normotensive    UTI, resolved  U/A WBC > 50  IV Rocephin 8/9 x 3 doses - completed  UCx w/ mult morphocytoes, recollection x 2 w/ multiple species  Hyperlipidemia  Home meds:  No statin listed  LDL 101, goal < 70  AST and ALT normalized   Continue Lipitor 40  Continue statin on discharge  Diabetes type II, uncontrolled->stable hypoglycemia  Home meds:  glucophage 1000 bid  HgbA1c 9.8, goal < 7.0  Off metformin 1000mg  due to hypoglycemia  CBGs  SSI  sensitive 0-9   Glucoses continues to fluctuate  Dysphagia, resolved . 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 After hours, contact General Neurology

## 2019-05-25 NOTE — Plan of Care (Signed)
Progressing towards goals

## 2019-05-26 LAB — GLUCOSE, CAPILLARY
Glucose-Capillary: 98 mg/dL (ref 70–99)
Glucose-Capillary: 242 mg/dL — ABNORMAL HIGH (ref 70–99)
Glucose-Capillary: 198 mg/dL — ABNORMAL HIGH (ref 70–99)
Glucose-Capillary: 142 mg/dL — ABNORMAL HIGH (ref 70–99)

## 2019-05-26 NOTE — TOC Progression Note (Signed)
Transition of Care [facility]) - Progression Note    Patient Details  Name: Joe Perry MRN: [id number] Date of Birth: August 12, 1951  Transition of Care [facility]) CM/SW [place],  Phone Number: 05/26/2019, 4:28 PM  Clinical Narrative:  CSW following for discharge plan. CSW contacted Claiborne Billings with CIR admissions to provide update that patient's wife is no longer working, family could be available for 24/7 assist if he would be appropriate for CIR placement. Claiborne Billings to review patient and let CSW know if he's appropriate for CIR Placement. Patient continues to have no bed offer for SNF and wife is becoming frustrated and disheartened by patient's lengthy hospital stay.     Expected Discharge Plan: [place] Barriers to Discharge: Continued Medical Work up, SNF Pending Medicaid  Expected Discharge Plan and Services Expected Discharge Plan: [place] In-house Referral: Clinical Social Work Discharge Planning Services: NA Post Acute Care Choice: [place] Living arrangements for the past 2 months: Single Family Home                 DME Arranged: N/A DME Agency: NA     Representative spoke with at DME Agency: na HH Arranged: NA [place] Agency: NA         Social Determinants of Health (SDOH) Interventions    Readmission Risk Interventions No flowsheet data found.

## 2019-05-26 NOTE — Progress Notes (Signed)
Physical Therapy Treatment Patient Details Name: Joe Perry MRN: [id number] DOB: 07-24-1951 Today's Date: 05/26/2019    History of Present Illness 68 yo male s/p  R PCOM and R PCA occlusion s/p emergent thrombectomy on 7/6. Pt admitteed with L sided weakness, R gaze perference, L homonymous hemianopsia. 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 MRI 8/28 showed new acute to subacute infarcts in the left BG and right cerebellar peduncle.    PT Comments    Patient seen for mobility progression. Pt is making progress toward PT goals. This session focused on gait training and dynamic sitting and standing balance. Today pt requires min/mod A for sit to stand transfers and mod A +2 for safety with gait. Given pt's progress and recently noted change in caregiver support ecommend CIR for further skilled PT services to maximize independence and safety with mobility.     Follow Up Recommendations  Supervision/Assistance - 24 hour;CIR     Equipment Recommendations  Other (comment)(TBD next venue)    Recommendations for Other Services Rehab consult     Precautions / Restrictions Precautions Precautions: Fall Precaution Comments: Left lateral lean, Left hemianopsia, Left inattention, impulsive    Mobility  Bed Mobility Overal bed mobility: Needs Assistance Bed Mobility: Supine to Sit     Supine to sit: Mod assist;HOB elevated     General bed mobility comments: cues for sequencing; assist to bring bilat LE to EOB and to elevate trunk into sitting   Transfers Overall transfer level: Needs assistance Equipment used: Rolling walker (2 wheeled) Transfers: Sit to/from Stand Sit to Stand: Mod assist;Min assist         General transfer comment: assist to power up into standing and to steady; cues for safe hand placement  Ambulation/Gait Ambulation/Gait assistance: Mod assist;+2 safety/equipment Gait Distance (Feet): (~20 ft total) Assistive device: Rolling walker (2 wheeled) Gait  Pattern/deviations: Step-through pattern;Narrow base of support;Decreased stride length Gait velocity: decreased   General Gait Details: assistance required for balance and guiding RW; L lateral bias and inattention so turning L is diffcult and pt tends to have increased narrow BOS   Stairs             Wheelchair Mobility    Modified Rankin (Stroke Patients Only) Modified Rankin (Stroke Patients Only) Pre-Morbid Rankin Score: No symptoms Modified Rankin: Moderately severe disability     Balance Overall balance assessment: Needs assistance Sitting-balance support: Feet supported Sitting balance-[name]: Fair Sitting balance - Comments: worked on sitting balance EOB by reaching outside BOS and working on lateral trunk rotation to L side while catching ball    Standing balance support: Bilateral upper extremity supported;During functional activity Standing balance-[name]: Poor Standing balance comment: requires external support in standing for dynamic standing balance tasks                            Cognition Arousal/Alertness: Awake/alert Behavior During Therapy: Flat affect Overall Cognitive Status: Impaired/Different from baseline Area of Impairment: Orientation;Attention;Problem solving;Following commands;Safety/judgement;Memory                 Orientation Level: Disoriented to;Time;Situation(choice hospital when given options) Current Attention Level: Sustained Memory: Decreased short-term memory Following Commands: Follows one step commands inconsistently;Follows one step commands with increased time Safety/Judgement: Decreased awareness of safety;Decreased awareness of deficits   Problem Solving: Slow processing;Difficulty sequencing;Requires verbal cues;Requires tactile cues        Exercises      General  Comments        Pertinent Vitals/Pain Pain Assessment: No/denies pain    Home Living                      Prior  Function            PT Goals (current goals can now be found in the care plan section) Progress towards PT goals: Progressing toward goals    Frequency    Min 4X/week      PT Plan Discharge plan needs to be updated;Frequency needs to be updated    Co-evaluation PT/OT/SLP Co-Evaluation/Treatment: Yes Reason for Co-Treatment: For patient/therapist safety;To address functional/ADL transfers;Necessary to address cognition/behavior during functional activity PT goals addressed during session: Mobility/safety with mobility;Balance;Proper use of DME        AM-PAC PT "6 Clicks" Mobility   Outcome Measure  Help needed turning from your back to your side while in a flat bed without using bedrails?: None Help needed moving from lying on your back to sitting on the side of a flat bed without using bedrails?: A Little Help needed moving to and from a bed to a chair (including a wheelchair)?: A Little Help needed standing up from a chair using your arms (e.g., wheelchair or bedside chair)?: A Lot Help needed to walk in hospital room?: A Lot Help needed climbing 3-5 steps with a railing? : A Lot 6 Click Score: 16    End of Session Equipment Utilized During Treatment: Gait belt Activity Tolerance: Patient tolerated treatment well Patient left: with call bell/phone within reach;in bed;with bed alarm set Nurse Communication: Mobility status PT Visit Diagnosis: Muscle weakness (generalized) (M62.81);Unsteadiness on feet (R26.81);Difficulty in walking, not elsewhere classified (R26.2);Hemiplegia and hemiparesis Hemiplegia - Right/Left: Left Hemiplegia - dominant/non-dominant: Dominant Hemiplegia - caused by: Cerebral infarction     Time: [id number] PT Time Calculation (min) (ACUTE ONLY): 35 min  Charges:  $Neuromuscular Re-education: 8-22 mins                     Joe Perry, PTA Acute Rehabilitation Services Pager: [phone] Office: [phone]     Joe Perry 05/26/2019, 5:18 PM

## 2019-05-26 NOTE — Progress Notes (Signed)
Occupational Therapy Treatment Patient Details Name: URIJAH BITTING MRN: [id number] DOB: [birth year] Today's Date: 05/26/2019    History of present illness 68 yo male s/p  R PCOM and R PCA occlusion s/p emergent thrombectomy on 7/6. Pt admitteed with L sided weakness, R gaze perference, L homonymous hemianopsia. 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 MRI 8/28 showed new acute to subacute infarcts in the left BG and right cerebellar peduncle.   OT comments  Pt progressing towards goals this session, mod A +2 for safety with RW to sink after max A for clothing change and peri care as Pt found in BM in bed, At the sink he required auditory cues for sequencing, finding items on the left. During this session he really enjoyed and engaged in throwing a beach ball back and forth working on dynamic balance, activity tolerance, coordination (L deficits remain). We also worked on [employer] and objects during functional tasks, which he had a 25% correct response to. Pt's home situation now has changed and he has 24 hour supervision and so recommending CIR re-consult as Pt has demonstrated progress towards goals (updated for OT today), motivation, and ability to perform 3 hours of intense therapy in all 3 disciplines.    Follow Up Recommendations  CIR;Supervision/Assistance - 24 hour    Equipment Recommendations  Other (comment)(defer to next venue of care)    Recommendations for Other Services Rehab consult    Precautions / Restrictions Precautions Precautions: Fall Precaution Comments: Left lateral lean, Left hemianopsia, Left inattention, impulsive Restrictions Weight Bearing Restrictions: No       Mobility Bed Mobility Overal bed mobility: Needs Assistance Bed Mobility: Supine to Sit;Sit to Supine     Supine to sit: Mod assist;HOB elevated Sit to supine: Min assist   General bed mobility comments: cues for sequencing; assist to bring bilat LE to EOB and to elevate trunk into sitting    Transfers Overall transfer level: Needs assistance Equipment used: Rolling walker (2 wheeled) Transfers: Sit to/from Stand Sit to Stand: Mod assist;Min assist         General transfer comment: assist to power up into standing and to steady; cues for safe hand placement    Balance Overall balance assessment: Needs assistance Sitting-balance support: Feet supported Sitting balance-[name]: Fair Sitting balance - Comments: worked on sitting balance EOB by reaching outside BOS and working on lateral trunk rotation to L side while catching ball    Standing balance support: Bilateral upper extremity supported;During functional activity Standing balance-[name]: Poor Standing balance comment: requires external support in standing for dynamic standing balance tasks               High Level Balance Comments: Pt able to engage in ball throwing task at both seated, and standing levels - required assist in standing           ADL either performed or assessed with clinical judgement   ADL Overall ADL's : Needs assistance/impaired     Grooming: Wash/dry hands;Wash/dry face;Moderate assistance;Standing;Cueing for sequencing Grooming Details (indicate cue type and reason): standing at sink, requiring mod A for locating items in L visual field as well as for sequencing and organization of task. Pt with anterior and L lean, requiring min-mod A to correct.     Lower Body Bathing: Maximal assistance;Sit to/from stand Lower Body Bathing Details (indicate cue type and reason): assist for thoroughness  Upper Body Dressing : Minimal assistance;Bed level;Cueing for sequencing Upper Body Dressing Details (indicate  cue type and reason): don/doff gown     Toilet Transfer: Moderate assistance;+2 for safety/equipment;+2 for physical assistance;Ambulation;Comfort height toilet;BSC Toilet Transfer Details (indicate cue type and reason): maxA for hygiene Toileting- Clothing Manipulation  and Hygiene: Maximal assistance;Sit to/from stand Toileting - Clothing Manipulation Details (indicate cue type and reason): Pt incontinent of stool, and required max A for clean up and peri care      Functional mobility during ADLs: Moderate assistance;+2 for physical assistance;+2 for safety/equipment General ADL Comments: continues to require consistent 1 step multimodal cues for organization and sequencing. Pt impulsive and perseverative. Placed BADL items on the L to facilitate tracking     Vision   Additional Comments: max cues to scan to left    Perception     Praxis      Cognition Arousal/Alertness: Awake/alert Behavior During Therapy: Flat affect Overall Cognitive Status: Impaired/Different from baseline Area of Impairment: Orientation;Attention;Problem solving;Following commands;Safety/judgement;Memory                 Orientation Level: Disoriented to;Time;Situation(choice hospital when given options) Current Attention Level: Sustained Memory: Decreased short-term memory Following Commands: Follows one step commands inconsistently;Follows one step commands with increased time Safety/Judgement: Decreased awareness of safety;Decreased awareness of deficits   Problem Solving: Slow processing;Difficulty sequencing;Requires verbal cues;Requires tactile cues          Exercises Other Exercises Other Exercises: standing and seated dynamic ball catching task   Shoulder Instructions       General Comments      Pertinent Vitals/ Pain       Pain Assessment: No/denies pain  Home Living                                          Prior Functioning/Environment              Frequency  Min 2X/week        Progress Toward Goals  OT Goals(current goals can now be found in the care plan section)  Progress towards OT goals: Progressing toward goals  Acute Rehab OT Goals Patient Stated Goal: get better, get home OT Goal Formulation: With  patient Time For Goal Achievement: 06/09/19 Potential to Achieve Goals: Good  Plan Discharge plan needs to be updated;Frequency remains appropriate    Co-evaluation    PT/OT/SLP Co-Evaluation/Treatment: Yes Reason for Co-Treatment: For patient/therapist safety;To address functional/ADL transfers;Necessary to address cognition/behavior during functional activity PT goals addressed during session: Mobility/safety with mobility;Balance;Proper use of DME OT goals addressed during session: ADL's and self-care;Proper use of Adaptive equipment and DME;Strengthening/ROM      AM-PAC OT "6 Clicks" Daily Activity     Outcome Measure   Help from another person eating meals?: A Lot Help from another person taking care of personal grooming?: A Lot Help from another person toileting, which includes using toliet, bedpan, or urinal?: A Lot Help from another person bathing (including washing, rinsing, drying)?: A Lot Help from another person to put on and taking off regular upper body clothing?: A Lot Help from another person to put on and taking off regular lower body clothing?: A Lot 6 Click Score: 12    End of Session Equipment Utilized During Treatment: Gait belt;Rolling walker  OT Visit Diagnosis: Unsteadiness on feet (R26.81);Low vision, both eyes (H54.2);Other symptoms and signs involving cognitive function;Other symptoms and signs involving the nervous system (R29.898) Symptoms and signs involving cognitive functions:  Cerebral infarction   Activity Tolerance Patient tolerated treatment well   Patient Left in bed;with call bell/phone within reach;with bed alarm set   Nurse Communication Mobility status        Time: [id number] OT Time Calculation (min): 35 min  Charges: OT General Charges $OT Visit: 1 Visit OT Treatments $Self Care/Home Management : 8-22 mins  Hulda Humphrey OTR/L Acute Rehabilitation Services Pager: [phone] Office: [place] 05/26/2019, 8:03 PM

## 2019-05-26 NOTE — Progress Notes (Signed)
STROKE TEAM PROGRESS NOTE   Interval history:  PT at bedside.  Patient sitting in bed working with PT. No neuro changes. As per PT, pt wife not working at this time, will revisit CIR this time.   Vitals:   05/25/19 2311 05/26/19 0316 05/26/19 0749 05/26/19 1135  BP: 131/68 (!) 146/99 139/85 (!) 186/91  Pulse: (!) 52 82 76 98  Resp: 16 16 20 20   Temp: 98.3 F (36.8 C) 97.9 F (36.6 C) 98.4 F (36.9 C) (!) 97.5 F (36.4 C)  TempSrc: Oral Oral Oral Oral  SpO2: 100% 100% 100% 100%  Weight:      Height:        CBC:  Recent Labs  Lab 05/22/19 0346  WBC 6.0  HGB 11.6*  HCT 35.2*  MCV 94.1  PLT 123456    Basic Metabolic Panel:  Recent Labs  Lab 05/22/19 0346  NA 137  K 3.9  CL 101  CO2 27  GLUCOSE 134*  BUN 10  CREATININE 0.71  CALCIUM 9.1   IMAGING past 24h No results found.    PHYSICAL EXAM    General- Well nourished, well developed, African male not in distress Ophthalmologic- fundi not visualized due to noncooperation. Lungs clear to auscultation Cardiovascular - Regular rate and rhythm.  Mental Status-  Awake alert oriented to self and place, speech is nonfluent and can barely speak a few words and nods appropriately. Follows simple midline in 1 and occasional two-step commands. Unable to name and repeat  Cranial Nerves II - XII- II - left dense homonymous hemianopia. III, IV, VI - Extraocular movements intact but right gaze preference with slight limitation of left lateral gaze V - Facial sensation intact bilaterally. VII - Facial movement intact bilaterally. VIII - Hearing & vestibular intact bilaterally. X - Palate elevates symmetrically. XI - Chin turning & shoulder shrug intact bilaterally. XII - Tongue protrusion intact.  Motor Strength -[name] was normal in right sided extremities and mild 4/5 weakness on the left. Mild weakness of left grip.Bulk was normal and fasciculations were absent.   Motor Tone- Muscle tone was  assessed at the neck and appendages and was normal.  Reflexes-[name] were symmetrical in all extremities andhehad no pathological reflexes.  Sensory-Light touch, temperature/pinprick were assessed and were symmetrical. Coordination impaired left finger-to-nose and knee to heel coordination. Left hemichorea movement while awake, improved from prior, resolved on sleeping  Gait and Station-not tested   ASSESSMENT/PLAN Joe Perry is a 68 y.o. 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   Stroke:   R large PCA infarct s/p IR w/ TICI2b revascularization, secondary to large vessel disease .  Stroke: inhospital Acute/subacute bilateral infarcts (MRI 05/02/2019) due to small vessel disease.   Code Stroke CT head No acute abnormality. Old R frontal infarct.    CTA head & neck - R P1 occlusion. Small BA w/ high grade atheromatous narrowing. L ICA siphon stenosis.  CT perfusion 19 cc penumbra  Cerebral angio TICI 2b revascularization occluded R PCOM and P PCA. Underlying large vessel atherosclerosis   MRI Rt PCA infarct    CT repeat - 04/11/2019 - Evolving R PCA infarct. No progression. No hemorrhage  Repeat MRI w/w/o contrast new acute/subacute infarcts left BG and right cerebellar peduncle 05/02/2019   2D Echo EF 60 to 65%.   Continue tele monitoring, so far no afib found   HIV neg  LDL 101  HgbA1c 9.8  P2Y12 90  Lovenox 40 mg sq daily  for VTE prophylaxis  aspirin 81 mg daily and clopidogrel 75 mg daily prior to admission, put on aspirin 325 mg daily and clopidogrel 75 mg daily. However, developed itchiness, plavix was discontinued. Now on ASA 81 and Brilinta 90 bid. Continue on discharge.  Therapy recommendations:  CIR (wife is not working now, able to provide home supervision after CIR)  Disposition:  Pending  Hx stroke/TIA   01/2016 - admitted for gait disability.  MRI negative for acute  infarct.  Old infarcts B BG, thalami, L cerebellum.   11/2018 - presented for dizziness and unsteady, MRI showed right frontal and parietal small white matter infarcts. No LVO. Extensive SVD.  Put on DAPT.  Patient signed out AMA  Left upper extremity involuntary movement  Likely due to right thalamic involvement of current stroke  Semi-involuntary during awake, absent during sleep  Not able to afford tetrabenazine  Put on abilify 5->10mg . However, pt developed vomiting episodes x 2 days. Improved after off abilify.  Hemichorea movement slowly improving  Cognitive decline, agitation and restless  CT head unchanged; EEG no seizure  Intermittent restless  Seroquel 25 bid  Now on aricept to 10mg  daily  Hypertension  Home meds:  None listed  Lotensin 5mg  daily stopped 8/8   On Norvasc 5 mg daily  BP 120-140  BP goal normotensive    UTI, resolved  U/A WBC > 50  IV Rocephin 8/9 x 3 doses - completed  UCx w/ mult morphocytoes, recollection x 2 w/ multiple species  Hyperlipidemia  Home meds:  No statin listed  LDL 101, goal < 70  AST and ALT normalized   Continue Lipitor 40  Continue statin on discharge  Diabetes type II, uncontrolled->stable hypoglycemia  Home meds:  glucophage 1000 bid  HgbA1c 9.8, goal < 7.0  Off metformin 1000mg  due to hypoglycemia  CBGs  SSI  sensitive 0-9   Glucoses continues to fluctuate  Dysphagia, resolved . 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 After hours, contact General Neurology

## 2019-05-26 NOTE — Progress Notes (Signed)
  Speech Language Pathology Treatment: Cognitive-Linquistic  Patient Details Name: Joe Perry MRN: [id number] DOB: 1951-06-04 Today's Date: 05/26/2019 Time: [id number] SLP Time Calculation (min) (ACUTE ONLY): 30 min  Assessment / Plan / Recommendation Clinical Impression  Pt was seen for treatment and was cooperative throughout the session. His level of alertness was notably improved compared to the prior two sessions but he did exhibit some difficulty maintaining his alertness without constant verbal stimulation. He was oriented to place and was oriented to time with mod cues for reasoning. He completed responsive naming tasks with 50% accuracy increasing to 75% with mod cues. At the onset of the session he stated that he needed to go to the bathroom and provided this as the reason for his attempting to get out of of bed. Mod to max cues were needed during structured problem solving tasks related to safety. He was re-educated regarding use of the call bell and verbalized understanding but this behavior again noted at the end of the session. He provided an average of 3 items per concrete category during divergent naming when mod cues were given. SLP will continue to follow pt.    HPI HPI: Pt is a 68 year old male admitted 03/10/2019 with left weakness, unsteady gait, right gaze, left hemianopsia. PMH: CVA, HTN, DM. CT negative. MRI Ischemic infarct of the right posterior cerebral artery, territory, roughly corresponding to the ischemic volume. Repeat MRI of 8/28: new acute/subacute infarcts left BG and right cerebellar peduncle      SLP Plan  Continue with current plan of care       Recommendations                   Oral Care Recommendations: Oral care BID Follow up Recommendations: Skilled Nursing facility SLP Visit Diagnosis: Cognitive communication deficit (R41.841);Aphasia (R47.01);Dysphagia, unspecified (R13.10) Plan: Continue with current plan of care       Joe Perry Joe.  Hardin Negus, [place], [place] Office number [phone] Pager [place] 05/26/2019, 12:50 PM

## 2019-05-27 DIAGNOSIS — I63431 Cerebral infarction due to embolism of right posterior cerebral artery: Secondary | ICD-10-CM

## 2019-05-27 LAB — GLUCOSE, CAPILLARY
Glucose-Capillary: 119 mg/dL — ABNORMAL HIGH (ref 70–99)
Glucose-Capillary: 240 mg/dL — ABNORMAL HIGH (ref 70–99)
Glucose-Capillary: 162 mg/dL — ABNORMAL HIGH (ref 70–99)
Glucose-Capillary: 252 mg/dL — ABNORMAL HIGH (ref 70–99)

## 2019-05-27 NOTE — Progress Notes (Signed)
Rehab Admissions Coordinator Note:  Per OT recommendation, patient was rescreened by Michel Santee for appropriateness for an Inpatient Acute Rehab Consult.  At this time, we are recommending Inpatient Rehab consult.  Michel Santee 05/27/2019, 10:08 AM  I can be reached at [phone].

## 2019-05-27 NOTE — Progress Notes (Signed)
STROKE TEAM PROGRESS NOTE   Interval history:  Pt was working with PT today, walking with walk in room. CIR recommended at this time. CIR coordinator is talking with wife.    Vitals:   05/26/19 1935 05/26/19 2316 05/27/19 0350 05/27/19 0736  BP: 110/86 121/71 (!) 163/100 130/81  Pulse: 89 60 88 60  Resp: 18 18 18 16   Temp: 98.4 F (36.9 C) 98.6 F (37 C) 98.6 F (37 C) (!) 97.2 F (36.2 C)  TempSrc: Oral Oral Oral Axillary  SpO2: 100% 100% 100% 96%  Weight:      Height:        CBC:  Recent Labs  Lab 05/22/19 0346  WBC 6.0  HGB 11.6*  HCT 35.2*  MCV 94.1  PLT 123456    Basic Metabolic Panel:  Recent Labs  Lab 05/22/19 0346  NA 137  K 3.9  CL 101  CO2 27  GLUCOSE 134*  BUN 10  CREATININE 0.71  CALCIUM 9.1   IMAGING past 24h No results found.    PHYSICAL EXAM    General- Well nourished, well developed, African male not in distress Ophthalmologic- fundi not visualized due to noncooperation. Lungs clear to auscultation Cardiovascular - Regular rate and rhythm.  Mental Status-  Awake alert oriented to self and place, speech is nonfluent and can barely speak a few words and nods appropriately. Follows simple midline in 1 and occasional two-step commands. Unable to name and repeat  Cranial Nerves II - XII- II - left dense homonymous hemianopia. III, IV, VI - Extraocular movements intact but right gaze preference with slight limitation of left lateral gaze V - Facial sensation intact bilaterally. VII - Facial movement intact bilaterally. VIII - Hearing & vestibular intact bilaterally. X - Palate elevates symmetrically. XI - Chin turning & shoulder shrug intact bilaterally. XII - Tongue protrusion intact.  Motor Strength -[name] was normal in right sided extremities and mild 4/5 weakness on the left. Mild weakness of left grip.Bulk was normal and fasciculations were absent.   Motor Tone- Muscle tone was assessed at the neck and  appendages and was normal.  Reflexes-[name] were symmetrical in all extremities andhehad no pathological reflexes.  Sensory-Light touch, temperature/pinprick were assessed and were symmetrical. Coordination impaired left finger-to-nose and knee to heel coordination. Left hemichorea movement while awake, improved from prior, resolved on sleeping  Gait and Station-not tested   ASSESSMENT/PLAN [name] is a 68 y.o. 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   Stroke:   R large PCA infarct s/p IR w/ TICI2b revascularization, secondary to large vessel disease .  Stroke: inhospital Acute/subacute bilateral infarcts (MRI 05/02/2019) due to small vessel disease.   Code Stroke CT head No acute abnormality. Old R frontal infarct.    CTA head & neck - R P1 occlusion. Small BA w/ high grade atheromatous narrowing. L ICA siphon stenosis.  CT perfusion 19 cc penumbra  Cerebral angio TICI 2b revascularization occluded R PCOM and P PCA. Underlying large vessel atherosclerosis   MRI Rt PCA infarct    CT repeat - 04/11/2019 - Evolving R PCA infarct. No progression. No hemorrhage  Repeat MRI w/w/o contrast new acute/subacute infarcts left BG and right cerebellar peduncle 05/02/2019   2D Echo EF 60 to 65%.   Continue tele monitoring, so far no afib found   HIV neg  LDL 101   HgbA1c 9.8  P2Y12 90  Lovenox 40 mg sq daily  for VTE prophylaxis  aspirin 81 mg daily and clopidogrel 75 mg daily prior to admission, put on aspirin 325 mg daily and clopidogrel 75 mg daily. However, developed itchiness, plavix was discontinued. Now on ASA 81 and Brilinta 90 bid. Continue on discharge.  Therapy recommendations:  CIR (wife is not working now, able to provide home supervision after CIR). consult placed  Disposition:  Pending  Hx stroke/TIA   01/2016 - admitted for gait disability.  MRI negative for acute infarct.   Old infarcts B BG, thalami, L cerebellum.   11/2018 - presented for dizziness and unsteady, MRI showed right frontal and parietal small white matter infarcts. No LVO. Extensive SVD.  Put on DAPT.  Patient signed out AMA  Left upper extremity involuntary movement  Likely due to right thalamic involvement of current stroke  Semi-involuntary during awake, absent during sleep  Not able to afford tetrabenazine  Put on abilify 5->10mg . However, pt developed vomiting episodes x 2 days. Improved after off abilify.  Hemichorea movement slowly improving  Cognitive decline, agitation and restless  CT head unchanged; EEG no seizure  Intermittent restless  Seroquel 25 bid  Now on aricept to 10mg  daily  Hypertension  Home meds:  None listed  Lotensin 5mg  daily stopped 8/8   On Norvasc 5 mg daily  Periodic BP increases 160, 180 that resolved  BP goal normotensive    UTI, resolved  U/A WBC > 50  IV Rocephin 8/9 x 3 doses - completed  UCx w/ mult morphocytoes, recollection x 2 w/ multiple species  Hyperlipidemia  Home meds:  No statin listed  LDL 101, goal < 70  AST and ALT normalized   Continue Lipitor 40  Continue statin on discharge  Diabetes type II, uncontrolled->stable hypoglycemia  Home meds:  glucophage 1000 bid  HgbA1c 9.8, goal < 7.0  Off metformin 1000mg  due to hypoglycemia  CBGs  SSI  sensitive 0-9   Glucoses continues to fluctuate  Dysphagia, resolved . 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 After hours, contact General Neurology

## 2019-05-27 NOTE — Progress Notes (Signed)
Inpatient Rehab Admissions:  Inpatient Rehab Consult received.  I am familiar with this patient as I had assessed him back in July. Unfortunately due to lack of caregiver assistance it was recommended pt pursue SNF for rehab. AC was notified on 9/21 (with consult placed 9/22) that pt's wife is now able to provide supervision at DC and thus pt may now benefit from short term rehab at CIR. AC met pt at the bedside this afternoon. Pt had safety mitts donned and was sitting up in bed, leaning over rail with telesitter in room. He required frequent redirection to lay back. Pt would need 24/7 A at DC from CIR based on assessment. With his permission, AC has placed call to his wife [name] to review DC plan. I will await her call to see if this pt now has the caregiver assistance in place to support a CIR admission.   Will follow up once I am able to speak with her.    , OTR/L  Rehab Admissions Coordinator  [phone] 05/27/2019 5:19 PM   

## 2019-05-27 NOTE — Progress Notes (Signed)
Fever, tylenol given, continues to spike temp  Recs: Pan culture Repeat Tylenol  -- Amie Portland, MD [facility] Pager: [phone] If 7pm to 7am, please call on call as listed on AMION.

## 2019-05-27 NOTE — Progress Notes (Signed)
Physical Therapy Treatment Patient Details Name: Joe Perry MRN: [id number] DOB: 11-20-1950 Today's Date: 05/27/2019    History of Present Illness 68 yo male s/p  R PCOM and R PCA occlusion s/p emergent thrombectomy on 7/6. Pt admitteed with L sided weakness, R gaze perference, L homonymous hemianopsia. 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 MRI 8/28 showed new acute to subacute infarcts in the left BG and right cerebellar peduncle.    PT Comments    Patient seen for mobility progression. Pt attempting to sit up in bed and restless upon arrival and pt stated he needed to use the bathroom. Pt requires min A for multiple sit to stand transfers during session and mod A +2 for safety with gait training and dynamic standing balance tasks during session. Continue to progress as tolerated.     Follow Up Recommendations  Supervision/Assistance - 24 hour;CIR     Equipment Recommendations  Other (comment)(TBD next venue)    Recommendations for Other Services Rehab consult     Precautions / Restrictions Precautions Precautions: Fall Precaution Comments: Left lateral lean, Left hemianopsia, Left inattention, impulsive Restrictions Weight Bearing Restrictions: No    Mobility  Bed Mobility Overal bed mobility: Needs Assistance Bed Mobility: Supine to Sit;Sit to Supine     Supine to sit: HOB elevated;Min assist Sit to supine: Min guard   General bed mobility comments: mutlimodal cues for sequencing  Transfers Overall transfer level: Needs assistance Equipment used: Rolling walker (2 wheeled) Transfers: Sit to/from Stand Sit to Stand: Min assist         General transfer comment: assist to steady; cues for safe hand placement each trial; pt stood several times during session as he kept needing to sit on [facility]   Ambulation/Gait Ambulation/Gait assistance: Mod assist;+2 safety/equipment Gait Distance (Feet): (~30 ft in room) Assistive device: Rolling walker (2 wheeled) Gait  Pattern/deviations: Step-through pattern;Narrow base of support;Decreased stride length Gait velocity: decreased   General Gait Details: assistance for balance and guiding RW; distance limited due to pt needing to stop for BM several times   Stairs             Wheelchair Mobility    Modified Rankin (Stroke Patients Only) Modified Rankin (Stroke Patients Only) Pre-Morbid Rankin Score: No symptoms Modified Rankin: Moderately severe disability     Balance Overall balance assessment: Needs assistance Sitting-balance support: Feet supported Sitting balance-[name]: Fair     Standing balance support: Bilateral upper extremity supported;During functional activity Standing balance-[name]: Poor Standing balance comment: worked on dynamic standing balance activities                             Cognition Arousal/Alertness: Awake/alert Behavior During Therapy: Flat affect Overall Cognitive Status: Impaired/Different from baseline Area of Impairment: Attention;Problem solving;Following commands;Safety/judgement;Memory                   Current Attention Level: Sustained Memory: Decreased short-term memory Following Commands: Follows one step commands inconsistently;Follows one step commands with increased time Safety/Judgement: Decreased awareness of safety;Decreased awareness of deficits Awareness: Intellectual Problem Solving: Slow processing;Difficulty sequencing;Requires verbal cues;Requires tactile cues        Exercises      General Comments        Pertinent Vitals/Pain Pain Assessment: No/denies pain    Home Living  Prior Function            PT Goals (current goals can now be found in the care plan section) Progress towards PT goals: Progressing toward goals    Frequency    Min 4X/week      PT Plan Current plan remains appropriate    Co-evaluation              AM-PAC PT "6 Clicks"  Mobility   Outcome Measure  Help needed turning from your back to your side while in a flat bed without using bedrails?: None Help needed moving from lying on your back to sitting on the side of a flat bed without using bedrails?: A Little Help needed moving to and from a bed to a chair (including a wheelchair)?: A Little Help needed standing up from a chair using your arms (e.g., wheelchair or bedside chair)?: A Little Help needed to walk in hospital room?: A Lot Help needed climbing 3-5 steps with a railing? : A Lot 6 Click Score: 17    End of Session Equipment Utilized During Treatment: Gait belt Activity Tolerance: Patient tolerated treatment well Patient left: with call bell/phone within reach;in bed;with bed alarm set Nurse Communication: Mobility status PT Visit Diagnosis: Muscle weakness (generalized) (M62.81);Unsteadiness on feet (R26.81);Difficulty in walking, not elsewhere classified (R26.2);Hemiplegia and hemiparesis Hemiplegia - Right/Left: Left Hemiplegia - dominant/non-dominant: Dominant Hemiplegia - caused by: Cerebral infarction     Time: [id number] PT Time Calculation (min) (ACUTE ONLY): 44 min  Charges:  $Gait Training: 8-22 mins $Therapeutic Activity: 8-22 mins $Neuromuscular Re-education: 8-22 mins                     Earney Navy, PTA Acute Rehabilitation Services Pager: [phone] Office: [phone]     Darliss Cheney 05/27/2019, 5:40 PM

## 2019-05-28 ENCOUNTER — Inpatient Hospital Stay (HOSPITAL_COMMUNITY): Payer: Medicare Other

## 2019-05-28 DIAGNOSIS — R509 Fever, unspecified: Secondary | ICD-10-CM

## 2019-05-28 LAB — COMPREHENSIVE METABOLIC PANEL
ALT: 16 U/L (ref 0–44)
AST: 17 U/L (ref 15–41)
Albumin: 3.3 g/dL — ABNORMAL LOW (ref 3.5–5.0)
Alkaline Phosphatase: 72 U/L (ref 38–126)
Anion gap: 9 (ref 5–15)
BUN: 12 mg/dL (ref 8–23)
CO2: 27 mmol/L (ref 22–32)
Calcium: 9.5 mg/dL (ref 8.9–10.3)
Chloride: 104 mmol/L (ref 98–111)
Creatinine, Ser: 0.77 mg/dL (ref 0.61–1.24)
GFR calc Af Amer: >60 mL/min (ref >60)
GFR calc non Af Amer: >60 mL/min (ref >60)
Glucose, Bld: 73 mg/dL (ref 70–99)
Potassium: 3.7 mmol/L (ref 3.5–5.1)
Sodium: 140 mmol/L (ref 135–145)
Total Bilirubin: 0.9 mg/dL (ref 0.3–1.2)
Total Protein: 7 g/dL (ref 6.5–8.1)

## 2019-05-28 LAB — GLUCOSE, CAPILLARY
Glucose-Capillary: 129 mg/dL — ABNORMAL HIGH (ref 70–99)
Glucose-Capillary: 174 mg/dL — ABNORMAL HIGH (ref 70–99)
Glucose-Capillary: 68 mg/dL — ABNORMAL LOW (ref 70–99)
Glucose-Capillary: 143 mg/dL — ABNORMAL HIGH (ref 70–99)

## 2019-05-28 LAB — URINALYSIS, COMPLETE (UACMP) WITH MICROSCOPIC
Bilirubin Urine: NEGATIVE
Glucose, UA: NEGATIVE mg/dL
Hgb urine dipstick: NEGATIVE
Ketones, ur: NEGATIVE mg/dL
Leukocytes,Ua: NEGATIVE
Nitrite: NEGATIVE
Protein, ur: 30 mg/dL — AB
Specific Gravity, Urine: 1.028 (ref 1.005–1.030)
pH: 5 (ref 5.0–8.0)

## 2019-05-28 LAB — CBC
HCT: 36.1 % — ABNORMAL LOW (ref 39.0–52.0)
Hemoglobin: 12 g/dL — ABNORMAL LOW (ref 13.0–17.0)
MCH: 31.3 pg (ref 26.0–34.0)
MCHC: 33.2 g/dL (ref 30.0–36.0)
MCV: 94 fL (ref 80.0–100.0)
Platelets: 304 10*3/uL (ref 150–400)
RBC: 3.84 MIL/uL — ABNORMAL LOW (ref 4.22–5.81)
RDW: 13.3 % (ref 11.5–15.5)
WBC: 10.9 10*3/uL — ABNORMAL HIGH (ref 4.0–10.5)
nRBC: 0 % (ref 0.0–0.2)

## 2019-05-28 MED ORDER — METFORMIN HCL 500 MG PO TABS
500.0000 mg | ORAL_TABLET | Freq: Two times a day (BID) | ORAL | Status: DC
  Administered 2019-05-28 – 2019-06-03 (×12): 500 mg via ORAL
  Filled 2019-05-28 (×12): qty 1

## 2019-05-28 NOTE — Progress Notes (Signed)
Physical Therapy Treatment Patient Details Name: Joe Perry MRN: [id number] DOB: [birth year] Today's Date: 05/28/2019    History of Present Illness 68 yo male s/p  R PCOM and R PCA occlusion s/p emergent thrombectomy on 7/6. Pt admitteed with L sided weakness, R gaze perference, L homonymous hemianopsia. 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 MRI 8/28 showed new acute to subacute infarcts in the left BG and right cerebellar peduncle.    PT Comments    Patient seen for mobility progression. Pt requires min guard for bed mobility, min A +2 for sit to stand, and mod A +2 for safety while gait training 120 ft with RW. Continue to progress as tolerated.    Follow Up Recommendations  Supervision/Assistance - 24 hour;CIR     Equipment Recommendations  Other (comment)(TBD next venue)    Recommendations for Other Services Rehab consult     Precautions / Restrictions Precautions Precautions: Fall Precaution Comments: Left lateral lean, Left hemianopsia, Left inattention, impulsive Restrictions Weight Bearing Restrictions: No    Mobility  Bed Mobility Overal bed mobility: Needs Assistance Bed Mobility: Supine to Sit Rolling: Min guard         General bed mobility comments: min guard for safety; use of rail; cues for sequencing  Transfers Overall transfer level: Needs assistance Equipment used: Rolling walker (2 wheeled) Transfers: Sit to/from Stand Sit to Stand: Min assist;+2 physical assistance         General transfer comment: cues and assistance for safe hand placement; assist to power up and to steady upon standing  Ambulation/Gait Ambulation/Gait assistance: Mod assist;+2 safety/equipment Gait Distance (Feet): 120 Feet Assistive device: Rolling walker (2 wheeled) Gait Pattern/deviations: Narrow base of support;Decreased stride length;Step-through pattern Gait velocity: decreased   General Gait Details: assistance to guide RW as pt does not attend to L side and  requires increased assist for balance and managing RW when turning to L side   Stairs             Wheelchair Mobility    Modified Rankin (Stroke Patients Only) Modified Rankin (Stroke Patients Only) Pre-Morbid Rankin Score: No symptoms Modified Rankin: Moderately severe disability     Balance Overall balance assessment: Needs assistance Sitting-balance support: Feet supported Sitting balance-[name]: Fair     Standing balance support: Bilateral upper extremity supported;During functional activity Standing balance-[name]: Poor                              Cognition Arousal/Alertness: Awake/alert Behavior During Therapy: Flat affect Overall Cognitive Status: Impaired/Different from baseline Area of Impairment: Attention;Problem solving;Following commands;Safety/judgement                   Current Attention Level: Sustained Memory: Decreased short-term memory   Safety/Judgement: Decreased awareness of safety;Decreased awareness of deficits   Problem Solving: Difficulty sequencing;Requires verbal cues;Requires tactile cues        Exercises      General Comments        Pertinent Vitals/Pain Pain Assessment: No/denies pain    Home Living                      Prior Function            PT Goals (current goals can now be found in the care plan section) Progress towards PT goals: Progressing toward goals    Frequency    Min 4X/week  PT Plan Current plan remains appropriate    Co-evaluation              AM-PAC PT "6 Clicks" Mobility   Outcome Measure  Help needed turning from your back to your side while in a flat bed without using bedrails?: None Help needed moving from lying on your back to sitting on the side of a flat bed without using bedrails?: A Little Help needed moving to and from a bed to a chair (including a wheelchair)?: A Little Help needed standing up from a chair using your arms  (e.g., wheelchair or bedside chair)?: A Little Help needed to walk in hospital room?: A Lot Help needed climbing 3-5 steps with a railing? : A Lot 6 Click Score: 17    End of Session Equipment Utilized During Treatment: Gait belt Activity Tolerance: Patient tolerated treatment well Patient left: with call bell/phone within reach;in chair;with chair alarm set;Other (comment)(tele sitter) Nurse Communication: Mobility status PT Visit Diagnosis: Muscle weakness (generalized) (M62.81);Unsteadiness on feet (R26.81);Difficulty in walking, not elsewhere classified (R26.2);Hemiplegia and hemiparesis Hemiplegia - Right/Left: Left Hemiplegia - dominant/non-dominant: Dominant Hemiplegia - caused by: Cerebral infarction     Time: [id number] PT Time Calculation (min) (ACUTE ONLY): 24 min  Charges:  $Gait Training: 23-37 mins                     Earney Navy, PTA Acute Rehabilitation Services Pager: [phone] Office: [phone]     Darliss Cheney 05/28/2019, 4:50 PM

## 2019-05-28 NOTE — Progress Notes (Signed)
STROKE TEAM PROGRESS NOTE   Interval history:  Pt lying comfortably in bed.  No neuro changes.  However, patient had overnight spiking fever, given Tylenol.  Has blood culture, urine culture pending.  Will do UA and CXR and check labs.  Vitals:   05/27/19 1900 05/27/19 2319 05/28/19 0357 05/28/19 0742  BP: 135/66 108/62 118/67 109/63  Pulse: (!) 109 97 72 64  Resp: 18 17 16 20   Temp: (!) 101.8 F (38.8 C) (!) 102.3 F (39.1 C) 98.4 F (36.9 C) 98.1 F (36.7 C)  TempSrc: Oral Oral Oral Axillary  SpO2: 100% 95% 96% 95%  Weight:      Height:        CBC:  Recent Labs  Lab 05/22/19 0346  WBC 6.0  HGB 11.6*  HCT 35.2*  MCV 94.1  PLT [id number]    Basic Metabolic Panel:  Recent Labs  Lab 05/22/19 0346  NA 137  K 3.9  CL 101  CO2 27  GLUCOSE 134*  BUN 10  CREATININE 0.71  CALCIUM 9.1   IMAGING past 24h No results found.    PHYSICAL EXAM General- Well nourished, well developed, African male not in distress Ophthalmologic- fundi not visualized due to noncooperation. Lungs clear to auscultation Cardiovascular - Regular rate and rhythm.  Mental Status-  Awake alert oriented to self and place, speech is nonfluent and can barely speak a few words but nods appropriately. Follows simple midline in 1 and occasional two-step commands. Unable to name and repeat  Cranial Nerves II - XII- II - left dense homonymous hemianopia. III, IV, VI - Extraocular movements intact but right gaze preference with slight limitation of left lateral gaze V - Facial sensation intact bilaterally. VII - Facial movement intact bilaterally. VIII - Hearing & vestibular intact bilaterally. X - Palate elevates symmetrically. XI - Chin turning & shoulder shrug intact bilaterally. XII - Tongue protrusion intact.  Motor Strength -[name] was symmetrical bilaterally except slight weakness of left grip.Bulk was normal and fasciculations were absent.   Motor Tone- Muscle tone  was assessed at the neck and appendages and was normal.  Reflexes-[name] were symmetrical in all extremities andhehad no pathological reflexes.  Sensory-Light touch, temperature/pinprick were assessed and were symmetrical. Coordination impaired left finger-to-nose and knee to heel coordination. Left hemichorea-like movement while awake, improved from prior, resolved on sleeping  Gait and Station-not tested   ASSESSMENT/PLAN Mr. CHANCE DIPACE is a 68 y.o. 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   Stroke:   R large PCA infarct s/p IR w/ TICI2b revascularization, secondary to large vessel disease .  Stroke: inhospital Acute/subacute bilateral infarcts (MRI 05/02/2019) due to small vessel disease.   Code Stroke CT head No acute abnormality. Old R frontal infarct.    CTA head & neck - R P1 occlusion. Small BA w/ high grade atheromatous narrowing. L ICA siphon stenosis.  CT perfusion 19 cc penumbra  Cerebral angio TICI 2b revascularization occluded R PCOM and P PCA. Underlying large vessel atherosclerosis   MRI Rt PCA infarct    CT repeat - 04/11/2019 - Evolving R PCA infarct. No progression. No hemorrhage  Repeat MRI w/w/o contrast new acute/subacute infarcts left BG and right cerebellar peduncle 05/02/2019   2D Echo EF 60 to 65%.   Continue tele monitoring, so far no afib found   HIV neg  LDL 101   HgbA1c 9.8  P2Y12 90  Lovenox  40 mg sq daily  for VTE prophylaxis  aspirin 81 mg daily and clopidogrel 75 mg daily prior to admission, put on aspirin 325 mg daily and clopidogrel 75 mg daily. However, developed itchiness, plavix was discontinued. Now on ASA 81 and Brilinta 90 bid. Continue on discharge.  Therapy recommendations:  CIR (wife is not working now, able to provide home supervision after CIR).   Disposition:  Pending  Hx stroke/TIA   01/2016 - admitted for gait disability.  MRI negative  for acute infarct.  Old infarcts B BG, thalami, L cerebellum.   11/2018 - presented for dizziness and unsteady, MRI showed right frontal and parietal small white matter infarcts. No LVO. Extensive SVD.  Put on DAPT.  Patient signed out AMA  Left upper extremity involuntary movement  Likely due to right thalamic involvement of current stroke  Semi-involuntary during awake, absent during sleep  Not able to afford tetrabenazine  Put on abilify 5->10mg . However, pt developed vomiting episodes x 2 days. Improved after off abilify.  Hemichorea movement slowly improving  Cognitive decline, agitation and restless  CT head unchanged; EEG no seizure  Intermittent restless  Seroquel 25 bid  Now on aricept to 10mg  daily  Hypertension  Home meds:  None listed  Lotensin 5mg  daily stopped 8/8   On Norvasc 5 mg daily  BP stable, goal normotensive    Fever, new   9/22 Tmax 102.3  Tylenol prn  Blood Cx pending    UCx pending    UA pending  Sputum pending    Labs pending   CXR pending  UTI, resolved  U/A WBC > 50  IV Rocephin 8/9 x 3 doses - completed  UCx w/ mult morphocytoes, recollection x 2 w/ multiple species  Hyperlipidemia  Home meds:  No statin listed  LDL 101, goal < 70  AST and ALT normalized   Continue Lipitor 40  Continue statin on discharge  Diabetes type II, uncontrolled->stable hypoglycemia  Home meds:  glucophage 1000 bid  HgbA1c 9.8, goal < 7.0  Off metformin 1000mg  due to hypoglycemia  CBGs  SSI  sensitive 0-9   Glucoses continues to fluctuate 129-240  put back metformin 500mg  bid   Dysphagia, resolved . 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 After hours, contact General Neurology

## 2019-05-28 NOTE — Progress Notes (Signed)
Inpatient Rehabilitation-Admissions Coordinator   Met with pt and his wife at the bedside for extensive conversation regarding rehab and appropriate caregiver support at DC. Despite lengthy discussion, no decisions were made this evening regarding disposition. Per wife's request, AC involved pt's friend [name] via phone to join in on conversation and help make decisions. At this time the main concern of the wife is her ability to manage his restlessness and impulsivity at home as well as potential cost of care. The pt has stated multiple times in the conversation that he prefers a shorter term rehab (such as CIR) over SNF. Feel pt would be a great candidate but also anticipate the pt's restlessness and impulsivity being an ongoing issue even after rehab.   Encouraged further discussion between the wife, pt, and Rollene Fare. Will follow up tomorrow.  [name], OTR/L  Rehab Admissions Coordinator  [phone] 05/28/2019 6:18 PM

## 2019-05-28 NOTE — Progress Notes (Signed)
  Speech Language Pathology Treatment: Dysphagia;Cognitive-Linquistic  Patient Details Name: Joe Perry MRN: [id number] DOB: [birth year] Today's Date: 05/28/2019 Time: 1110-1130 SLP Time Calculation (min) (ACUTE ONLY): 20 min  Assessment / Plan / Recommendation Clinical Impression  Pt seen at bedside for skilled ST intervention targeting goals for improved swallow safety and cognitive - linguistic function. RN reports pt is tolerating current diet (puree/thin liquids) well. Trials of dys 2 (finely chopped) have been unsuccessful per RN. Diagnostic treatment continued to reassess pt current levels. Pt speaks at a significantly low volume, and was unintelligible today. Pt was able to answer personal yes/no questions regarding pain and comfort, however, limited interaction today due to lethargy. No po trials were given for this reason. Pt has not met goals set for problem solving, awareness, or reasoning. Goals were updated. Safe swallow precautions were posted at [facility] to maximize pt safety.   Continued ST intervention is recommended to maximize pt function and minimize caregiver burden.    HPI HPI: Pt is a 68 year old male admitted 03/10/2019 with left weakness, unsteady gait, right gaze, left hemianopsia. PMH: CVA, HTN, DM. CT negative. MRI Ischemic infarct of the right posterior cerebral artery, territory, roughly corresponding to the ischemic volume. Repeat MRI of 8/28: new acute/subacute infarcts left BG and right cerebellar peduncle      SLP Plan  Goals updated       Recommendations  Diet recommendations: Dysphagia 1 (puree);Thin liquid Liquids provided via: Cup;Straw Medication Administration: Crushed with puree Supervision: Staff to assist with self feeding;Full supervision/cueing for compensatory strategies Compensations: Slow rate;Small sips/bites;Minimize environmental distractions;Lingual sweep for clearance of pocketing Postural Changes and/or Swallow Maneuvers: Seated upright  90 degrees;Upright 30-60 min after meal                Oral Care Recommendations: Oral care BID Follow up Recommendations: Skilled Nursing facility SLP Visit Diagnosis: Cognitive communication deficit (R41.841);Aphasia (R47.01);Dysphagia, unspecified (R13.10) Plan: Goals updated       GO           Joe Perry [facility], CCC-SLP Speech Language Pathologist [phone]  Joe Perry 05/28/2019, 11:40 AM

## 2019-05-28 NOTE — Progress Notes (Signed)
Inpatient Rehabilitation-Admissions Coordinator   [facility] spoke with pt's wife and close friend [name] via phone. AC discussed CIR program and anticipated caregiver assistance needed at DC. I plan to meet with pt's wife in person today around 5:30 to continue with discussion. Via phone, pt's wife said she just started a sewing class this past Monday that is 3 weeks long, 7 hours a day. Will need to clarify DC plan and further discuss expectations at DC.   Will follow up.   [name], OTR/L  Rehab Admissions Coordinator  [phone] 05/28/2019 11:16 AM

## 2019-05-29 LAB — CBC
HCT: 33.4 % — ABNORMAL LOW (ref 39.0–52.0)
Hemoglobin: 11.1 g/dL — ABNORMAL LOW (ref 13.0–17.0)
MCH: 31 pg (ref 26.0–34.0)
MCHC: 33.2 g/dL (ref 30.0–36.0)
MCV: 93.3 fL (ref 80.0–100.0)
Platelets: 294 10*3/uL (ref 150–400)
RBC: 3.58 MIL/uL — ABNORMAL LOW (ref 4.22–5.81)
RDW: 13.1 % (ref 11.5–15.5)
WBC: 7.9 10*3/uL (ref 4.0–10.5)
nRBC: 0 % (ref 0.0–0.2)

## 2019-05-29 LAB — GLUCOSE, CAPILLARY
Glucose-Capillary: 113 mg/dL — ABNORMAL HIGH (ref 70–99)
Glucose-Capillary: 132 mg/dL — ABNORMAL HIGH (ref 70–99)
Glucose-Capillary: 122 mg/dL — ABNORMAL HIGH (ref 70–99)
Glucose-Capillary: 154 mg/dL — ABNORMAL HIGH (ref 70–99)

## 2019-05-29 LAB — BASIC METABOLIC PANEL
Anion gap: 9 (ref 5–15)
BUN: 10 mg/dL (ref 8–23)
CO2: 26 mmol/L (ref 22–32)
Calcium: 9.4 mg/dL (ref 8.9–10.3)
Chloride: 102 mmol/L (ref 98–111)
Creatinine, Ser: 0.68 mg/dL (ref 0.61–1.24)
GFR calc Af Amer: >60 mL/min (ref >60)
GFR calc non Af Amer: >60 mL/min (ref >60)
Glucose, Bld: 165 mg/dL — ABNORMAL HIGH (ref 70–99)
Potassium: 4 mmol/L (ref 3.5–5.1)
Sodium: 137 mmol/L (ref 135–145)

## 2019-05-29 MED ORDER — SODIUM CHLORIDE 0.9 % IV SOLN: 1.0000 g | INTRAVENOUS | Status: DC

## 2019-05-29 MED ORDER — QUETIAPINE FUMARATE 25 MG PO TABS
25.0000 mg | ORAL_TABLET | Freq: Once | ORAL | Status: AC
  Administered 2019-05-29: 25 mg via ORAL
  Filled 2019-05-29: qty 1

## 2019-05-29 MED ORDER — AMPICILLIN 500 MG PO CAPS
500.0000 mg | ORAL_CAPSULE | Freq: Four times a day (QID) | ORAL | Status: DC
  Filled 2019-05-29: qty 1

## 2019-05-29 MED ORDER — QUETIAPINE FUMARATE 25 MG PO TABS
25.0000 mg | ORAL_TABLET | Freq: Three times a day (TID) | ORAL | Status: DC
  Administered 2019-05-29 – 2019-06-03 (×14): 25 mg via ORAL
  Filled 2019-05-29 (×14): qty 1

## 2019-05-29 MED ORDER — SODIUM CHLORIDE 0.9 % IV SOLN
1.0000 g | INTRAVENOUS | Status: DC
  Administered 2019-05-29: 1 g via INTRAVENOUS
  Filled 2019-05-29: qty 10

## 2019-05-29 NOTE — Progress Notes (Signed)
STROKE TEAM PROGRESS NOTE   Interval history:  Patient had a fever yesterday, afebrile today.  UA negative.  However urine culture showed staph hemolyticus and enterococcus Faecalis, put on Rocephin.  Vitals:   05/28/19 1538 05/29/19 0016 05/29/19 0314 05/29/19 0957  BP: 128/74 (!) 144/92 115/65 106/66  Pulse: 64 79 60 79  Resp: 16 15 16 18   Temp: 98 F (36.7 C) 97.8 F (36.6 C) 98.5 F (36.9 C) 97.8 F (36.6 C)  TempSrc: Oral Oral Oral Oral  SpO2: 99% 100% 100% 100%  Weight:      Height:        CBC:  Recent Labs  Lab 05/28/19 1453 05/29/19 0401  WBC 10.9* 7.9  HGB 12.0* 11.1*  HCT 36.1* 33.4*  MCV 94.0 93.3  PLT 304 [id number]    Basic Metabolic Panel:  Recent Labs  Lab 05/28/19 1453 05/29/19 0401  NA 140 137  K 3.7 4.0  CL 104 102  CO2 27 26  GLUCOSE 73 165*  BUN 12 10  CREATININE 0.77 0.68  CALCIUM 9.5 9.4   IMAGING past 24h Dg Chest Port 1 View  Result Date: 05/28/2019 CLINICAL DATA:  68 year old male with history of fever. EXAM: PORTABLE CHEST 1 VIEW COMPARISON:  Chest x-ray 04/02/2019. FINDINGS: Lung volumes are low. No consolidative airspace disease. No pleural effusions. No pneumothorax. No pulmonary nodule or mass noted. Pulmonary vasculature and the cardiomediastinal silhouette are within normal limits. Atherosclerotic calcifications in the thoracic aorta. IMPRESSION: 1.  No radiographic evidence of acute cardiopulmonary disease. 2. Aortic atherosclerosis. Electronically Signed   By: Vinnie Langton M.D.   On: 05/28/2019 16:10      PHYSICAL EXAM General- Well nourished, well developed, African male not in distress  Ophthalmologic- fundi not visualized due to noncooperation.  Cardiovascular - Regular rate and rhythm.  Mental Status-  Awake alert oriented to self and place, speech is nonfluent and can barely speak a few words but nods appropriately. Follows simple midline in 1 and occasional two-step commands. Unable to name and repeat.  Mild  neglect on the left  Cranial Nerves II - XII- II - left dense homonymous hemianopia. III, IV, VI - Extraocular movements intact but right gaze preference with slight limitation of left lateral gaze V - Facial sensation intact bilaterally. VII - Facial movement intact bilaterally. VIII - Hearing & vestibular intact bilaterally. X - Palate elevates symmetrically. XI - Chin turning & shoulder shrug intact bilaterally. XII - Tongue protrusion intact.  Motor Strength -[name] was symmetrical bilaterally.Bulk was normal and fasciculations were absent.   Motor Tone- Muscle tone was assessed at the neck and appendages and was normal.  Reflexes-[name] were symmetrical in all extremities andhehad no pathological reflexes.  Sensory-Light touch, temperature/pinprick were assessed and were symmetrical. Coordination impaired left finger-to-nose and knee to heel coordination. Bilateral, L>R, chorea-like movement while awake, improved from prior, resolved on sleeping  Gait and Station-not tested   ASSESSMENT/PLAN [name] is a 68 y.o. 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   Stroke:   R large PCA infarct s/p IR w/ TICI2b revascularization, secondary to large vessel disease .  Stroke: inhospital Acute/subacute bilateral infarcts (MRI 05/02/2019) due to small vessel disease.   Code Stroke CT head No acute abnormality. Old R frontal infarct.    CTA head & neck - R P1 occlusion. Small BA w/ high grade atheromatous narrowing. L ICA siphon stenosis.  CT perfusion 19 cc penumbra  Cerebral angio TICI 2b revascularization occluded R PCOM and P PCA. Underlying large vessel atherosclerosis   MRI Rt PCA infarct    CT repeat - 04/11/2019 - Evolving R PCA infarct. No progression. No hemorrhage  Repeat MRI w/w/o contrast new acute/subacute infarcts left BG and right cerebellar peduncle  05/02/2019   2D Echo EF 60 to 65%.   Continue tele monitoring, so far no afib found   HIV neg  LDL 101   HgbA1c 9.8  P2Y12 90  Lovenox 40 mg sq daily  for VTE prophylaxis  aspirin 81 mg daily and clopidogrel 75 mg daily prior to admission, put on aspirin 325 mg daily and clopidogrel 75 mg daily. However, developed itchiness, plavix was discontinued. Now on ASA 81 and Brilinta 90 bid. Continue on discharge.  Therapy recommendations:  CIR (wife is not working now, able to provide home supervision after CIR). Rehab admissions coordinator following w/ pt, wife and friend [name]. No decisions yet about admission.   Disposition:  Pending  Hx stroke/TIA   01/2016 - admitted for gait disability.  MRI negative for acute infarct.  Old infarcts B BG, thalami, L cerebellum.   11/2018 - presented for dizziness and unsteady, MRI showed right frontal and parietal small white matter infarcts. No LVO. Extensive SVD.  Put on DAPT.  Patient signed out AMA  Involuntary movement  Likely due to right thalamic involvement of current stroke  Semi-involuntary during awake, absent during sleep  Not able to afford tetrabenazine  Put on abilify 5->10mg . However, pt developed vomiting episodes x 2 days. Improved after off abilify.  Chorea-like movement slowly improving  Cognitive decline, agitation and restless  CT head unchanged; EEG no seizure  Intermittent restless  Seroquel 25 bid, increase to 25 mg 3 times daily  on aricept to 10mg  daily  Increased restlessness last night. Given additional 25 seroquel.  Monitor   Hypertension  Home meds:  None listed  Lotensin 5mg  daily stopped 8/8   On Norvasc 5 mg daily  BP stable, goal normotensive    Fever with UTI  9/22 Tmax 102.3->afebrile now  Tylenol prn  WBC normal  Blood Cx pending    UCx > 100k staph haemolyticus, >100k enterococcus faecalis, susceptibilities pending  Start rocephin 1 gm IV x 5 days    Sputum culture  pending    CXR NAD  Hyperlipidemia  Home meds:  No statin listed  LDL 101, goal < 70  AST and ALT normalized   Continue Lipitor 40  Continue statin on discharge  Diabetes type II, uncontrolled->stable hypoglycemia  Home meds:  glucophage 1000 bid  HgbA1c 9.8, goal < 7.0  Off metformin 1000mg  due to hypoglycemia  CBGs  SSI  sensitive 0-9   put back metformin 500mg  bid   Glucoses 68-143  Dysphagia, resolved . 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 After  hours, contact General Neurology

## 2019-05-29 NOTE — Progress Notes (Signed)
  Speech Language Pathology Treatment: Cognitive-Linquistic  Patient Details Name: Joe Perry MRN: [id number] DOB: 12-07-1950 Today's Date: 05/29/2019 Time: [id number] SLP Time Calculation (min) (ACUTE ONLY): 20 min  Assessment / Plan / Recommendation Clinical Impression  Pt was seen for treatment and was cooperative during the session. He adequately followed 1-step commands during functional contexts. He produced phrases and sentences but they were often unrelated to the conversational topic or the SLP's question. For example, he stated, "sometimes it's like that for [place]" when asked how he was doing today and expressed, "I took medicine for myself" when asked why he was in the hospital. He continues to be oriented to place and his level of alertness was adequate for the session. He required max cues for sequencing during use of phone and mod cues for recall of necessary steps when his wife repeatedly attempted to reach him via phone. However, he was ultimately successful and was able to answer the phone and converse with his wife. The session was terminated to facilitate this conversation. SLP will continue to follow pt.    HPI HPI: Pt is a 68 year old male admitted 03/10/2019 with left weakness, unsteady gait, right gaze, left hemianopsia. PMH: CVA, HTN, DM. CT negative. MRI Ischemic infarct of the right posterior cerebral artery, territory, roughly corresponding to the ischemic volume. Repeat MRI of 8/28: new acute/subacute infarcts left BG and right cerebellar peduncle      SLP Plan  Continue with current plan of care       Recommendations                   Follow up Recommendations: Skilled Nursing facility vs CIR SLP Visit Diagnosis: Cognitive communication deficit (R41.841);Aphasia (R47.01);Dysphagia, unspecified (R13.10) Plan: Continue with current plan of care       Avyanna Spada I. Hardin Negus, [place] Office number [phone] Pager  [place] 05/29/2019, 2:15 PM

## 2019-05-29 NOTE — Progress Notes (Signed)
RN Called now and had called 3 hours ago. Pt. Restless, agitated, attempting to get out of bed. At the time of first call, he was due for his seroquel, which I recommended he get and reassess. Pt continues to be agitated and restless.  Recs: Additional 25mg  PO seroquel now. Consider restraints - will avoid as it might delay already delayed placement.  -- Amie Portland, MD [facility] Pager: [phone] If 7pm to 7am, please call on call as listed on AMION.

## 2019-05-30 LAB — BASIC METABOLIC PANEL
Anion gap: 7 (ref 5–15)
BUN: 8 mg/dL (ref 8–23)
CO2: 28 mmol/L (ref 22–32)
Calcium: 9 mg/dL (ref 8.9–10.3)
Chloride: 103 mmol/L (ref 98–111)
Creatinine, Ser: 0.66 mg/dL (ref 0.61–1.24)
GFR calc Af Amer: >60 mL/min (ref >60)
GFR calc non Af Amer: >60 mL/min (ref >60)
Glucose, Bld: 111 mg/dL — ABNORMAL HIGH (ref 70–99)
Potassium: 3.9 mmol/L (ref 3.5–5.1)
Sodium: 138 mmol/L (ref 135–145)

## 2019-05-30 LAB — URINE CULTURE: Culture: >=100000 — AB

## 2019-05-30 LAB — CBC
HCT: 34 % — ABNORMAL LOW (ref 39.0–52.0)
Hemoglobin: 11.1 g/dL — ABNORMAL LOW (ref 13.0–17.0)
MCH: 30.7 pg (ref 26.0–34.0)
MCHC: 32.6 g/dL (ref 30.0–36.0)
MCV: 94.2 fL (ref 80.0–100.0)
Platelets: 294 10*3/uL (ref 150–400)
RBC: 3.61 MIL/uL — ABNORMAL LOW (ref 4.22–5.81)
RDW: 13.2 % (ref 11.5–15.5)
WBC: 5.3 10*3/uL (ref 4.0–10.5)
nRBC: 0 % (ref 0.0–0.2)

## 2019-05-30 LAB — GLUCOSE, CAPILLARY
Glucose-Capillary: 110 mg/dL — ABNORMAL HIGH (ref 70–99)
Glucose-Capillary: 112 mg/dL — ABNORMAL HIGH (ref 70–99)

## 2019-05-30 MED ORDER — SODIUM CHLORIDE 0.9 % IV SOLN
INTRAVENOUS | Status: DC | PRN
  Administered 2019-05-30: 02:00:00 500 mL via INTRAVENOUS

## 2019-05-30 NOTE — Plan of Care (Signed)
?  Problem: Nutrition: ?Goal: Dietary intake will improve ?Outcome: Progressing ?  ?

## 2019-05-30 NOTE — TOC Progression Note (Signed)
Transition of Care [facility]) - Progression Note    Patient Details  Name: Joe Perry MRN: [id number] Date of Birth: 26-Oct-1950  Transition of Care [facility]) CM/SW [place], [place] Phone Number: 05/30/2019, 10:07 AM  Clinical Narrative:   CSW received call from patient's wife this morning to ask if her sister could come up with her to visit and see what's going on with the patient, and CSW reiterated to her that there is only one visitor allowed. Wife put her sister on the phone to ask questions about what is going on with the patient, and CSW provided update to the wife's sister about barriers for rehab placement. She asked about Medicare and CSW indicated that the patient didn't sign up for Medicare when he turned 67 so now he has to wait until open enrollment in October.  CSW also discussed with wife about a meeting with rehab admissions, and that we could get permission for [name] to come up to participate in family meeting about determining care for the patient. Rehab admissions to follow up with wife and [name] about scheduling meeting. CSW to continue to follow.    Expected Discharge Plan: Skilled Nursing Facility Barriers to Discharge: SNF Pending payor source - LOG(on difficult to place list)  Expected Discharge Plan and Services Expected Discharge Plan: [place] In-house Referral: Clinical Social Work Discharge Planning Services: NA Post Acute Care Choice: [place] Living arrangements for the past 2 months: Single Family Home                 DME Arranged: N/A DME Agency: NA     Representative spoke with at DME Agency: na HH Arranged: NA [place] Agency: NA         Social Determinants of Health (SDOH) Interventions    Readmission Risk Interventions No flowsheet data found.

## 2019-05-30 NOTE — Progress Notes (Signed)
STROKE TEAM PROGRESS NOTE   Interval history:  Pt sitting in chair, awake alert, interactive, chorea like movement much improved.    Vitals:   05/29/19 1956 05/29/19 2358 05/30/19 0345 05/30/19 0858  BP: 135/71 (!) 150/87 137/85 123/68  Pulse: 81 89 62 83  Resp: 17 20 18 17   Temp: 98.2 F (36.8 C) 99.3 F (37.4 C) 98.5 F (36.9 C) 98 F (36.7 C)  TempSrc:  Oral Oral Oral  SpO2: 91% 100% 100% 100%  Weight:      Height:        CBC:  Recent Labs  Lab 05/29/19 0401 05/30/19 0347  WBC 7.9 5.3  HGB 11.1* 11.1*  HCT 33.4* 34.0*  MCV 93.3 94.2  PLT 294 [id number]    Basic Metabolic Panel:  Recent Labs  Lab 05/29/19 0401 05/30/19 0347  NA 137 138  K 4.0 3.9  CL 102 103  CO2 26 28  GLUCOSE 165* 111*  BUN 10 8  CREATININE 0.68 0.66  CALCIUM 9.4 9.0   IMAGING past 24h No results found.    PHYSICAL EXAM   General- Well nourished, well developed, African male not in distress  Ophthalmologic- fundi not visualized due to noncooperation.  Cardiovascular - Regular rate and rhythm.  Mental Status-  Awake alert oriented to self and place, speech is nonfluent and can barely speak a few words but nods appropriately. Follows simple midline in 1 and occasional two-step commands. Unable to name and repeat.  Mild neglect on the left  Cranial Nerves II - XII- II - left dense homonymous hemianopia. III, IV, VI - Extraocular movements intact but right gaze preference with slight limitation of left lateral gaze V - Facial sensation intact bilaterally. VII - Facial movement intact bilaterally. VIII - Hearing & vestibular intact bilaterally. X - Palate elevates symmetrically. XI - Chin turning & shoulder shrug intact bilaterally. XII - Tongue protrusion intact.  Motor Strength -[name] was symmetrical bilaterally.Bulk was normal and fasciculations were absent.   Motor Tone- Muscle tone was assessed at the neck and appendages and was  normal.  Reflexes-[name] were symmetrical in all extremities andhehad no pathological reflexes.  Sensory-Light touch, temperature/pinprick were assessed and were symmetrical. Coordination impaired left finger-to-nose and knee to heel coordination. Bilateral, L>R, chorea-like movement while awake, improved from prior, resolved on sleeping  Gait and Station-not tested   ASSESSMENT/PLAN [name] is a 68 y.o. 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   Stroke:   R large PCA infarct s/p IR w/ TICI2b revascularization, secondary to large vessel disease .  Stroke: inhospital Acute/subacute bilateral infarcts (MRI 05/02/2019) due to small vessel disease.   Code Stroke CT head No acute abnormality. Old R frontal infarct.    CTA head & neck - R P1 occlusion. Small BA w/ high grade atheromatous narrowing. L ICA siphon stenosis.  CT perfusion 19 cc penumbra  Cerebral angio TICI 2b revascularization occluded R PCOM and P PCA. Underlying large vessel atherosclerosis   MRI Rt PCA infarct    CT repeat - 04/11/2019 - Evolving R PCA infarct. No progression. No hemorrhage  Repeat MRI w/w/o contrast new acute/subacute infarcts left BG and right cerebellar peduncle 05/02/2019   2D Echo EF 60 to 65%.   Continue tele monitoring, so far no afib found   HIV neg  LDL 101   HgbA1c 9.8  P2Y12 90  Lovenox 40 mg sq daily  for VTE prophylaxis  aspirin 81 mg daily and clopidogrel 75 mg daily prior to admission, put on aspirin 325 mg daily and clopidogrel 75 mg daily. However, developed itchiness, plavix was discontinued. Now on ASA 81 and Brilinta 90 bid. Continue on discharge.  Therapy recommendations:  CIR (wife is not working now, able to provide home supervision after CIR). Rehab admissions coordinator will meet w/ pt, wife and friend [name] on 9/28 for further decisions.   Disposition:  Pending  Hx stroke/TIA    01/2016 - admitted for gait disability.  MRI negative for acute infarct.  Old infarcts B BG, thalami, L cerebellum.   11/2018 - presented for dizziness and unsteady, MRI showed right frontal and parietal small white matter infarcts. No LVO. Extensive SVD.  Put on DAPT.  Patient signed out AMA  Involuntary movement  Likely due to right thalamic involvement of current stroke  Semi-involuntary during awake, absent during sleep  Not able to afford tetrabenazine  Put on abilify 5->10mg . However, pt developed vomiting episodes x 2 days. Improved after off abilify.  Chorea-like movement slowly improving  Cognitive decline, agitation and restless  CT head unchanged; EEG no seizure  Intermittent restless  Seroquel 25 bid, increase to 25 mg 3 times daily  on aricept to 10mg  daily  Monitor   Hypertension  Home meds:  None listed  Lotensin 5mg  daily stopped 8/8   On Norvasc 5 mg daily  BP stable, goal normotensive    Fever with UTI  9/22 Tmax 102.3->afebrile now  Tylenol prn  WBC normal  Blood Cx NGTD  UCx > 100k staph haemolyticus, >100k enterococcus faecalis  Rocephin 9/24>>9/25. Stopped after discussion with pharmacist that drug is not covering either organism and UA WBC only 0-5 making UTI unlikely.    CXR NAD  Hyperlipidemia  Home meds:  No statin listed  LDL 101, goal < 70  AST and ALT normalized   Continue Lipitor 40  Continue statin on discharge  Diabetes type II, uncontrolled->stable hypoglycemia  Home meds:  glucophage 1000 bid  HgbA1c 9.8, goal < 7.0  Off metformin 1000mg  due to hypoglycemia  CBGs  SSI  sensitive 0-9   put back metformin 500mg  bid   Glucoses remains stable  Dysphagia, resolved . 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 After hours, contact General Neurology

## 2019-05-30 NOTE — Progress Notes (Signed)
Inpatient Rehabilitation-Admissions Coordinator   Plan for meeting with pt, his wife [name], his friend [name], and Kathlee Nations with SW on Monday, 9/28 at 3pm to discuss disposition and rehab options. Hopefully, there will be a decision made on that date regarding CIR vs SNF.   Please call if questions.   [name], OTR/L  Rehab Admissions Coordinator  [phone] 05/30/2019 3:52 PM

## 2019-05-30 NOTE — Progress Notes (Signed)
Physical Therapy Treatment Patient Details Name: Joe Perry MRN: [id number] DOB: [birth year] Today's Date: 05/30/2019    History of Present Illness 68 yo male s/p  R PCOM and R PCA occlusion s/p emergent thrombectomy on 7/6. Pt admitteed with L sided weakness, R gaze perference, L homonymous hemianopsia. 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 MRI 8/28 showed new acute to subacute infarcts in the left BG and right cerebellar peduncle.    PT Comments    Patient more lethargic today with RN reporting he had seroquel at 10:00 am. She still felt it would be good for patient to get OOB and eat lunch, therefore proceeded with PT session. Once mobilizing, pt maintained eyes open, attended to PT, however needed incr processing time with max tactile and visual cues. Noted goal update due based on timeframe and completed.    Follow Up Recommendations  CIR;Supervision/Assistance - 24 hour     Equipment Recommendations  Other (comment)(TBD next venue)    Recommendations for Other Services       Precautions / Restrictions Precautions Precautions: Fall Precaution Comments: Left lateral lean, Left hemianopsia, Left inattention, impulsive    Mobility  Bed Mobility Overal bed mobility: Needs Assistance Bed Mobility: Supine to Sit     Supine to sit: Min assist     General bed mobility comments: no rail; did reach for PT's hand to initiate  Transfers Overall transfer level: Needs assistance Equipment used: None Transfers: Sit to/from [facility] Sit to Stand: Min assist Stand pivot transfers: Mod assist       General transfer comment: bed to chair to his right; min assist until final step backwards with LLE buckling and LOB to left required mod assist; straight sit to stand min assist but with max cues due to lethargy/slow processing  Ambulation/Gait             General Gait Details: not safe due to level of arousal   Stairs             Wheelchair  Mobility    Modified Rankin (Stroke Patients Only) Modified Rankin (Stroke Patients Only) Pre-Morbid Rankin Score: No symptoms Modified Rankin: Moderately severe disability     Balance   Sitting-balance support: Feet supported Sitting balance-[name]: Fair Sitting balance - Comments: maintained balance while changed wet gown and even able to lift each foot and maintain balance (donning brief)   Standing balance support: No upper extremity supported;Bilateral upper extremity supported Standing balance-[name]: Poor Standing balance comment: static standing with no UE support with slight lean to left; with bil forearm support via PT, able to work on weight shifts left and rt; small excursion with min assist; attempted marching, however pt adducts LLE when lifted and with feet too close together begins to lean to left                            Cognition Arousal/Alertness: Lethargic;Suspect due to medications(RN reports he gets seroquel 10 am) Behavior During Therapy: Flat affect Overall Cognitive Status: Difficult to assess                         Following Commands: Follows one step commands inconsistently Safety/Judgement: Decreased awareness of safety;Decreased awareness of deficits   Problem Solving: Slow processing;Difficulty sequencing;Requires verbal cues;Requires tactile cues        Exercises General Exercises - Lower Extremity Hip Flexion/Marching: Both;Other reps (comment);Standing(3 reps  alternating, then LOB)    General Comments General comments (skin integrity, edema, etc.): Nurse tech made aware of wet linens and pt is set up for lunch (requires full supervision)      Pertinent Vitals/Pain Pain Assessment: Faces Faces Pain Scale: No hurt    Home Living                      Prior Function            PT Goals (current goals can now be found in the care plan section) Acute Rehab PT Goals Patient Stated Goal: get  better, get home PT Goal Formulation: Patient unable to participate in goal setting Time For Goal Achievement: 06/13/19 Potential to Achieve Goals: Fair Progress towards PT goals: Not progressing toward goals - comment(lethargic from meds; new goals set due to timeframe)    Frequency    Min 4X/week      PT Plan Current plan remains appropriate    Co-evaluation              AM-PAC PT "6 Clicks" Mobility   Outcome Measure  Help needed turning from your back to your side while in a flat bed without using bedrails?: None Help needed moving from lying on your back to sitting on the side of a flat bed without using bedrails?: A Little Help needed moving to and from a bed to a chair (including a wheelchair)?: A Lot Help needed standing up from a chair using your arms (e.g., wheelchair or bedside chair)?: A Little Help needed to walk in hospital room?: Total Help needed climbing 3-5 steps with a railing? : Total 6 Click Score: 14    End of Session Equipment Utilized During Treatment: Gait belt Activity Tolerance: Patient limited by lethargy Patient left: in chair;with call bell/phone within reach;with chair alarm set;with nursing/sitter in room   PT Visit Diagnosis: Muscle weakness (generalized) (M62.81);Unsteadiness on feet (R26.81);Difficulty in walking, not elsewhere classified (R26.2);Hemiplegia and hemiparesis Hemiplegia - Right/Left: Left Hemiplegia - dominant/non-dominant: Dominant Hemiplegia - caused by: Cerebral infarction     Time: 1210-1237 PT Time Calculation (min) (ACUTE ONLY): 27 min  Charges:  $Neuromuscular Re-education: 23-37 mins                       [name], PT       [name] 05/30/2019, 12:55 PM

## 2019-05-31 DIAGNOSIS — E785 Hyperlipidemia, unspecified: Secondary | ICD-10-CM

## 2019-05-31 DIAGNOSIS — E663 Overweight: Secondary | ICD-10-CM

## 2019-05-31 LAB — GLUCOSE, CAPILLARY
Glucose-Capillary: 118 mg/dL — ABNORMAL HIGH (ref 70–99)
Glucose-Capillary: 145 mg/dL — ABNORMAL HIGH (ref 70–99)
Glucose-Capillary: 95 mg/dL (ref 70–99)
Glucose-Capillary: 136 mg/dL — ABNORMAL HIGH (ref 70–99)

## 2019-05-31 NOTE — Progress Notes (Signed)
STROKE TEAM PROGRESS NOTE   Interval history:  Pt laying in bed, interactive, sleepy. Neuro stable.    Vitals:   05/30/19 2307 05/31/19 0349 05/31/19 0739 05/31/19 1133  BP: 127/76 (!) 144/84 121/70 118/66  Pulse: 91 79 (!) 51 64  Resp: 18 (!) 8 16 16   Temp: 97.7 F (36.5 C) (!) 97.4 F (36.3 C) 97.7 F (36.5 C) (!) 97.5 F (36.4 C)  TempSrc: Oral Oral Axillary Oral  SpO2: 99%  99% 98%  Weight:      Height:        CBC:  Recent Labs  Lab 05/29/19 0401 05/30/19 0347  WBC 7.9 5.3  HGB 11.1* 11.1*  HCT 33.4* 34.0*  MCV 93.3 94.2  PLT 294 [id number]    Basic Metabolic Panel:  Recent Labs  Lab 05/29/19 0401 05/30/19 0347  NA 137 138  K 4.0 3.9  CL 102 103  CO2 26 28  GLUCOSE 165* 111*  BUN 10 8  CREATININE 0.68 0.66  CALCIUM 9.4 9.0   IMAGING past 24h No results found.    PHYSICAL EXAM   General- Well nourished, well developed, African male not in distress  Ophthalmologic- fundi not visualized due to noncooperation.  Cardiovascular - Regular rate and rhythm.  Mental Status-  Awake alert oriented to self and place, speech is nonfluent and can barely speak a few words but nods appropriately. Follows simple midline in 1 and occasional two-step commands. Unable to name and repeat.  Mild neglect on the left  Cranial Nerves II - XII- II - left dense homonymous hemianopia. III, IV, VI - Extraocular movements intact but right gaze preference with slight limitation of left lateral gaze V - Facial sensation intact bilaterally. VII - Facial movement intact bilaterally. VIII - Hearing & vestibular intact bilaterally. X - Palate elevates symmetrically. XI - Chin turning & shoulder shrug intact bilaterally. XII - Tongue protrusion intact.  Motor Strength -[name] was symmetrical bilaterally.Bulk was normal and fasciculations were absent.   Motor Tone- Muscle tone was assessed at the neck and appendages and was normal.  Reflexes-The  patient's reflexes were symmetrical in all extremities andhehad no pathological reflexes.  Sensory-Light touch, temperature/pinprick were assessed and were symmetrical. Coordination impaired left finger-to-nose and knee to heel coordination. Bilateral, L>R, chorea-like movement while awake, improved from prior, resolved on sleeping  Gait and Station-not tested   ASSESSMENT/PLAN Joe Perry is a 68 y.o. 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   Stroke:   R large PCA infarct s/p IR w/ TICI2b revascularization, secondary to large vessel disease .  Stroke: inhospital Acute/subacute bilateral infarcts (MRI 05/02/2019) due to small vessel disease.   Code Stroke CT head No acute abnormality. Old R frontal infarct.    CTA head & neck - R P1 occlusion. Small BA w/ high grade atheromatous narrowing. L ICA siphon stenosis.  CT perfusion 19 cc penumbra  Cerebral angio TICI 2b revascularization occluded R PCOM and P PCA. Underlying large vessel atherosclerosis   MRI Rt PCA infarct    CT repeat - 04/11/2019 - Evolving R PCA infarct. No progression. No hemorrhage  Repeat MRI w/w/o contrast new acute/subacute infarcts left BG and right cerebellar peduncle 05/02/2019   2D Echo EF 60 to 65%.   Continue tele monitoring, so far no afib found   HIV neg  LDL 101   HgbA1c 9.8  P2Y12 90  Lovenox 40 mg sq daily  for VTE prophylaxis  aspirin 81 mg daily and clopidogrel 75 mg daily prior to admission, put on aspirin 325 mg daily and clopidogrel 75 mg daily. However, developed itchiness, plavix was discontinued. Now on ASA 81 and Brilinta 90 bid. Continue on discharge.  Therapy recommendations:  CIR (wife is not working now, able to provide home supervision after CIR). Rehab admissions coordinator will meet w/ pt, wife and friend [name] on 9/28 for further decisions.   Disposition:  Pending  Hx stroke/TIA   01/2016 - admitted for  gait disability.  MRI negative for acute infarct.  Old infarcts B BG, thalami, L cerebellum.   11/2018 - presented for dizziness and unsteady, MRI showed right frontal and parietal small white matter infarcts. No LVO. Extensive SVD.  Put on DAPT.  Patient signed out AMA  Involuntary movement  Likely due to right thalamic involvement of current stroke  Semi-involuntary during awake, absent during sleep  Not able to afford tetrabenazine  Put on abilify 5->10mg . However, pt developed vomiting episodes x 2 days. Improved after off abilify.  Chorea-like movement slowly improving  Cognitive decline, agitation and restless  CT head unchanged; EEG no seizure  Intermittent restless  Seroquel 25 bid, increase to 25 mg 3 times daily  on aricept to 10mg  daily  Monitor   Hypertension  Home meds:  None listed  Lotensin 5mg  daily stopped 8/8   On Norvasc 5 mg daily  BP stable, goal normotensive    Fever with UTI  9/22 Tmax 102.3->afebrile now  Tylenol prn  WBC normal  Blood Cx NGTD  UCx > 100k staph haemolyticus, >100k enterococcus faecalis  Rocephin 9/24>>9/25. Stopped after discussion with pharmacist that drug is not covering either organism and UA WBC only 0-5 making UTI unlikely.    CXR NAD  Hyperlipidemia  Home meds:  No statin listed  LDL 101, goal < 70  AST and ALT normalized   Continue Lipitor 40  Continue statin on discharge  Diabetes type II, uncontrolled->stable hypoglycemia  Home meds:  glucophage 1000 bid  HgbA1c 9.8, goal < 7.0  Off metformin 1000mg  due to hypoglycemia  CBGs  SSI  sensitive 0-9   put back metformin 500mg  bid   Glucoses remains stable  Dysphagia, resolved . On D1 thin liquids, meds in puree  Other Stroke Risk Factors  Advanced age  Overweight, Body mass index is 24.74 kg/m., recommend weight loss, diet and exercise as appropriate   No driving due to hemianopia, has discussed with wife   Other Active  Problems  Constipation, abd xray ok, put on senokot, prn bisacodyl and fleets  N/V - resolved  Chronic stuttering - as per wife  Hypokalemia, resolved  4-30mm vestibular schwannoma, needs outpatient follow up  Hospital day # 72     Personally examined patient and images, and have participated in and made any corrections needed to history, physical, neuro exam,assessment and plan as stated above.  I have personally obtained the history, evaluated lab date, reviewed imaging studies and agree with radiology interpretations.    Sarina Ill, MD Stroke Neurology   A total of 25 minutes was spent for the care of this patient, spent on counseling patient and family on different diagnostic and therapeutic options, counseling and coordination of care, riskd ans benefits of management, compliance, or risk factor reduction and education.    To contact Stroke Continuity provider, please refer to [email or web address] After hours, contact General Neurology

## 2019-05-31 NOTE — Plan of Care (Signed)
?  Problem: Nutrition: ?Goal: Dietary intake will improve ?Outcome: Progressing ?  ?

## 2019-06-01 LAB — GLUCOSE, CAPILLARY
Glucose-Capillary: 117 mg/dL — ABNORMAL HIGH (ref 70–99)
Glucose-Capillary: 232 mg/dL — ABNORMAL HIGH (ref 70–99)
Glucose-Capillary: 128 mg/dL — ABNORMAL HIGH (ref 70–99)
Glucose-Capillary: 101 mg/dL — ABNORMAL HIGH (ref 70–99)

## 2019-06-01 MED ORDER — FLEET ENEMA 7-19 GM/118ML RE ENEM
1.0000 | ENEMA | Freq: Every day | RECTAL | Status: DC | PRN
  Administered 2019-06-01: 1 via RECTAL
  Filled 2019-06-01: qty 1

## 2019-06-01 NOTE — Plan of Care (Signed)
  Problem: Nutrition: Goal: Risk of aspiration will decrease Outcome: Progressing   

## 2019-06-01 NOTE — Progress Notes (Signed)
STROKE TEAM PROGRESS NOTE   Interval history:  Constipated, ordered enema, otherwise neuro stable, placement difficulty. Sitting on the commode trying to have a bowel movement. Nurse at bedside. Neuro stable, no events overnight.  Vitals:   05/31/19 1513 05/31/19 1939 06/01/19 0027 06/01/19 0401  BP: (!) 106/59 121/72 110/66 113/60  Pulse: 77 69 77 79  Resp: 16 18 18 20   Temp: 98 F (36.7 C) 97.9 F (36.6 C) 98 F (36.7 C) 98.3 F (36.8 C)  TempSrc: Oral Oral Oral Oral  SpO2: 100% 100% 100% 100%  Weight:      Height:        CBC:  Recent Labs  Lab 05/29/19 0401 05/30/19 0347  WBC 7.9 5.3  HGB 11.1* 11.1*  HCT 33.4* 34.0*  MCV 93.3 94.2  PLT 294 [id number]    Basic Metabolic Panel:  Recent Labs  Lab 05/29/19 0401 05/30/19 0347  NA 137 138  K 4.0 3.9  CL 102 103  CO2 26 28  GLUCOSE 165* 111*  BUN 10 8  CREATININE 0.68 0.66  CALCIUM 9.4 9.0   IMAGING past 24h No results found.    PHYSICAL EXAM   General- Well nourished, well developed, African male not in distress  Ophthalmologic- fundi not visualized due to noncooperation.  Cardiovascular - Regular rate and rhythm.  Mental Status-  Awake alert oriented to self and place, speech is nonfluent and can barely speak a few words but nods appropriately. Follows simple midline in 1 and occasional two-step commands. Unable to name and repeat.  Mild neglect on the left  Cranial Nerves II - XII- II - left dense homonymous hemianopia. III, IV, VI - Extraocular movements intact but right gaze preference with slight limitation of left lateral gaze V - Facial sensation intact bilaterally. VII - Facial movement intact bilaterally. VIII - Hearing & vestibular intact bilaterally. X - Palate elevates symmetrically. XI - Chin turning & shoulder shrug intact bilaterally. XII - Tongue protrusion intact.  Motor Strength -[name] was symmetrical bilaterally.Bulk was normal and fasciculations were absent.    Motor Tone- Muscle tone was assessed at the neck and appendages and was normal.  Reflexes-[name] were symmetrical in all extremities andhehad no pathological reflexes.  Sensory-Light touch, temperature/pinprick were assessed and were symmetrical. Coordination impaired left finger-to-nose and knee to heel coordination. Bilateral, L>R, chorea-like movement while awake, improved from prior, resolved on sleeping  Gait and Station-not tested   ASSESSMENT/PLAN [name] is a 68 y.o. 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   Stroke:   R large PCA infarct s/p IR w/ TICI2b revascularization, secondary to large vessel disease .  Stroke: inhospital Acute/subacute bilateral infarcts (MRI 05/02/2019) due to small vessel disease.   Code Stroke CT head No acute abnormality. Old R frontal infarct.    CTA head & neck - R P1 occlusion. Small BA w/ high grade atheromatous narrowing. L ICA siphon stenosis.  CT perfusion 19 cc penumbra  Cerebral angio TICI 2b revascularization occluded R PCOM and P PCA. Underlying large vessel atherosclerosis   MRI Rt PCA infarct    CT repeat - 04/11/2019 - Evolving R PCA infarct. No progression. No hemorrhage  Repeat MRI w/w/o contrast new acute/subacute infarcts left BG and right cerebellar peduncle 05/02/2019   2D Echo EF 60 to 65%.   Continue tele monitoring, so far no afib found   HIV neg  LDL 101  HgbA1c 9.8  P2Y12 90  Lovenox 40 mg sq daily  for VTE prophylaxis  aspirin 81 mg daily and clopidogrel 75 mg daily prior to admission, put on aspirin 325 mg daily and clopidogrel 75 mg daily. However, developed itchiness, plavix was discontinued. Now on ASA 81 and Brilinta 90 bid. Continue on discharge.  Therapy recommendations:  CIR (wife is not working now, able to provide home supervision after CIR). Rehab admissions coordinator will meet w/ pt, wife and friend  [name] on 9/28 for further decisions.   Disposition:  Pending  Hx stroke/TIA   01/2016 - admitted for gait disability.  MRI negative for acute infarct.  Old infarcts B BG, thalami, L cerebellum.   11/2018 - presented for dizziness and unsteady, MRI showed right frontal and parietal small white matter infarcts. No LVO. Extensive SVD.  Put on DAPT.  Patient signed out AMA  Involuntary movement  Likely due to right thalamic involvement of current stroke  Semi-involuntary during awake, absent during sleep  Not able to afford tetrabenazine  Put on abilify 5->10mg . However, pt developed vomiting episodes x 2 days. Improved after off abilify.  Chorea-like movement slowly improving  Cognitive decline, agitation and restless  CT head unchanged; EEG no seizure  Intermittent restless  Seroquel 25 bid, increase to 25 mg 3 times daily  on aricept to 10mg  daily  Monitor   Hypertension  Home meds:  None listed  Lotensin 5mg  daily stopped 8/8   On Norvasc 5 mg daily  BP stable, goal normotensive    Fever with UTI  9/22 Tmax 102.3->afebrile now  Tylenol prn  WBC normal  Blood Cx NGTD  UCx > 100k staph haemolyticus, >100k enterococcus faecalis  Rocephin 9/24>>9/25. Stopped after discussion with pharmacist that drug is not covering either organism and UA WBC only 0-5 making UTI unlikely.    CXR NAD  Hyperlipidemia  Home meds:  No statin listed  LDL 101, goal < 70  AST and ALT normalized   Continue Lipitor 40  Continue statin on discharge  Diabetes type II, uncontrolled->stable hypoglycemia  Home meds:  glucophage 1000 bid  HgbA1c 9.8, goal < 7.0  Off metformin 1000mg  due to hypoglycemia  CBGs  SSI  sensitive 0-9   put back metformin 500mg  bid   Glucoses remains stable  Dysphagia, resolved . 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  I have personally obtained the history, evaluated lab date, reviewed imaging studies and agree with radiology interpretations.   A total of 15 minutes was spent for the care of this patient, spent on counseling patient and family on different diagnostic and therapeutic options, counseling and coordination of care, riskd ans benefits of management, compliance, or risk factor reduction and education.    To contact Stroke Continuity provider, please refer to [email or web address] After hours, contact General Neurology

## 2019-06-02 LAB — CULTURE, BLOOD (ROUTINE X 2)
Culture: NO GROWTH
Culture: NO GROWTH
Special Requests: ADEQUATE
Special Requests: ADEQUATE

## 2019-06-02 LAB — GLUCOSE, CAPILLARY
Glucose-Capillary: 115 mg/dL — ABNORMAL HIGH (ref 70–99)
Glucose-Capillary: 107 mg/dL — ABNORMAL HIGH (ref 70–99)
Glucose-Capillary: 74 mg/dL (ref 70–99)
Glucose-Capillary: 158 mg/dL — ABNORMAL HIGH (ref 70–99)
Glucose-Capillary: 115 mg/dL — ABNORMAL HIGH (ref 70–99)
Glucose-Capillary: 107 mg/dL — ABNORMAL HIGH (ref 70–99)

## 2019-06-02 NOTE — Progress Notes (Signed)
Physical Therapy Treatment Patient Details Name: Joe Perry MRN: [id number] DOB: [birth year] Today's Date: 06/02/2019    History of Present Illness 68 yo male s/p  R PCOM and R PCA occlusion s/p emergent thrombectomy on 7/6. Pt admitteed with L sided weakness, R gaze perference, L homonymous hemianopsia. 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 MRI 8/28 showed new acute to subacute infarcts in the left BG and right cerebellar peduncle.    PT Comments    Patient seen for mobility progression. Continue to recommend CIR level therapies to maximize independence and safety with mobility.    Follow Up Recommendations  CIR;Supervision/Assistance - 24 hour     Equipment Recommendations  Other (comment)(TBD next venue)    Recommendations for Other Services Rehab consult     Precautions / Restrictions Precautions Precautions: Fall Precaution Comments: Left lateral lean, Left hemianopsia, Left inattention, impulsive Restrictions Weight Bearing Restrictions: No    Mobility  Bed Mobility Overal bed mobility: Needs Assistance Bed Mobility: Supine to Sit     Supine to sit: Min assist     General bed mobility comments: multimodal cues for sequencing; pt unable to process how to get to EOB; assist to bring bilat LE to EOB   Transfers Overall transfer level: Needs assistance Equipment used: Rolling walker (2 wheeled) Transfers: Sit to/from Stand Sit to Stand: Min assist         General transfer comment: assist to steady  Ambulation/Gait Ambulation/Gait assistance: Mod assist;+2 safety/equipment Gait Distance (Feet): 180 Feet Assistive device: Rolling walker (2 wheeled) Gait Pattern/deviations: Narrow base of support;Decreased stride length;Step-through pattern;Drifts right/left Gait velocity: decreased   General Gait Details: assistance required for balance and guiding RW; cues for navigating hallway and attempted to work on scanning; cues for upright posture and forward  gaze   Stairs             Wheelchair Mobility    Modified Rankin (Stroke Patients Only) Modified Rankin (Stroke Patients Only) Pre-Morbid Rankin Score: No symptoms Modified Rankin: Moderately severe disability     Balance Overall balance assessment: Needs assistance Sitting-balance support: Feet supported Sitting balance-[name]: Fair     Standing balance support: No upper extremity supported;Bilateral upper extremity supported Standing balance-[name]: Poor                              Cognition Arousal/Alertness: Awake/alert Behavior During Therapy: Flat affect Overall Cognitive Status: Impaired/Different from baseline Area of Impairment: Attention;Problem solving;Following commands;Safety/judgement;Orientation;Memory                 Orientation Level: Disoriented to;Time;Situation Current Attention Level: Sustained Memory: Decreased short-term memory Following Commands: Follows one step commands inconsistently Safety/Judgement: Decreased awareness of safety;Decreased awareness of deficits   Problem Solving: Slow processing;Difficulty sequencing;Requires verbal cues;Requires tactile cues        Exercises      General Comments        Pertinent Vitals/Pain Pain Assessment: No/denies pain    Home Living                      Prior Function            PT Goals (current goals can now be found in the care plan section) Progress towards PT goals: Progressing toward goals    Frequency    Min 4X/week      PT Plan Current plan remains appropriate    Co-evaluation  AM-PAC PT "6 Clicks" Mobility   Outcome Measure  Help needed turning from your back to your side while in a flat bed without using bedrails?: None Help needed moving from lying on your back to sitting on the side of a flat bed without using bedrails?: A Little Help needed moving to and from a bed to a chair (including a  wheelchair)?: A Lot Help needed standing up from a chair using your arms (e.g., wheelchair or bedside chair)?: A Little Help needed to walk in hospital room?: A Lot Help needed climbing 3-5 steps with a railing? : A Lot 6 Click Score: 16    End of Session Equipment Utilized During Treatment: Gait belt Activity Tolerance: Patient tolerated treatment well Patient left: in chair;with call bell/phone within reach;with chair alarm set Nurse Communication: Mobility status PT Visit Diagnosis: Muscle weakness (generalized) (M62.81);Unsteadiness on feet (R26.81);Difficulty in walking, not elsewhere classified (R26.2);Hemiplegia and hemiparesis Hemiplegia - Right/Left: Left Hemiplegia - dominant/non-dominant: Dominant Hemiplegia - caused by: Cerebral infarction     Time: 1040-1100 PT Time Calculation (min) (ACUTE ONLY): 20 min  Charges:  $Gait Training: 8-22 mins                    Earney Navy, PTA Acute Rehabilitation Services Pager: [phone] Office: [phone]     Darliss Cheney 06/02/2019, 4:05 PM

## 2019-06-02 NOTE — Progress Notes (Signed)
STROKE TEAM PROGRESS NOTE   Interval history:  He did have a bowel movement yesterday after the anemia.  No changes neuro stable, no events overnight.  Vitals:   06/01/19 2351 06/02/19 0435 06/02/19 0905 06/02/19 1123  BP: 124/72 (!) 136/59 (!) 144/81 118/76  Pulse: 88 62 70 82  Resp: 18 18 16 20   Temp: 98.4 F (36.9 C) 98.9 F (37.2 C) 97.7 F (36.5 C) 98.5 F (36.9 C)  TempSrc: Oral Oral Oral Oral  SpO2: 100% 100% 100% 98%  Weight:      Height:        CBC:  Recent Labs  Lab 05/29/19 0401 05/30/19 0347  WBC 7.9 5.3  HGB 11.1* 11.1*  HCT 33.4* 34.0*  MCV 93.3 94.2  PLT 294 [id number]    Basic Metabolic Panel:  Recent Labs  Lab 05/29/19 0401 05/30/19 0347  NA 137 138  K 4.0 3.9  CL 102 103  CO2 26 28  GLUCOSE 165* 111*  BUN 10 8  CREATININE 0.68 0.66  CALCIUM 9.4 9.0   IMAGING past 24h No results found.    PHYSICAL EXAM   General- Well nourished, well developed, African male not in distress  Ophthalmologic- fundi not visualized due to noncooperation.  Cardiovascular - Regular rate and rhythm.  Mental Status-  Awake alert oriented to self and place, speech is nonfluent and can barely speak a few words but nods appropriately. Follows simple midline in 1 and occasional two-step commands. Unable to name and repeat.  Mild neglect on the left  Cranial Nerves II - XII- II - left dense homonymous hemianopia. III, IV, VI - Extraocular movements intact but right gaze preference with slight limitation of left lateral gaze V - Facial sensation intact bilaterally. VII - Facial movement intact bilaterally. VIII - Hearing & vestibular intact bilaterally. X - Palate elevates symmetrically. XI - Chin turning & shoulder shrug intact bilaterally. XII - Tongue protrusion intact.  Motor Strength -[name] was symmetrical bilaterally.Bulk was normal and fasciculations were absent.   Motor Tone- Muscle tone was assessed at the neck and appendages  and was normal.  Reflexes-[name] were symmetrical in all extremities andhehad no pathological reflexes.  Sensory-Light touch, temperature/pinprick were assessed and were symmetrical. Coordination impaired left finger-to-nose and knee to heel coordination. Bilateral, L>R, chorea-like movement while awake, improved from prior, resolved on sleeping  Gait and Station-not tested   ASSESSMENT/PLAN Mr. ELIHU THELIN is a 68 y.o. 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   Stroke:   R large PCA infarct s/p IR w/ TICI2b revascularization, secondary to large vessel disease .  Stroke: inhospital Acute/subacute bilateral infarcts (MRI 05/02/2019) due to small vessel disease.   Code Stroke CT head No acute abnormality. Old R frontal infarct.    CTA head & neck - R P1 occlusion. Small BA w/ high grade atheromatous narrowing. L ICA siphon stenosis.  CT perfusion 19 cc penumbra  Cerebral angio TICI 2b revascularization occluded R PCOM and P PCA. Underlying large vessel atherosclerosis   MRI Rt PCA infarct    CT repeat - 04/11/2019 - Evolving R PCA infarct. No progression. No hemorrhage  Repeat MRI w/w/o contrast new acute/subacute infarcts left BG and right cerebellar peduncle 05/02/2019   2D Echo EF 60 to 65%.   Continue tele monitoring, so far no afib found   HIV neg  LDL 101   HgbA1c 9.8  P2Y12 90  Lovenox 40 mg sq daily  for VTE prophylaxis  aspirin 81 mg daily and clopidogrel 75 mg daily prior to admission, put on aspirin 325 mg daily and clopidogrel 75 mg daily. However, developed itchiness, plavix was discontinued. Now on ASA 81 and Brilinta 90 bid. Continue on discharge.  Therapy recommendations:  CIR (wife is not working now, able to provide home supervision after CIR). Rehab admissions coordinator will meet w/ pt, wife and friend [name] on 9/28 for further decisions.   Disposition:  Pending  Hx  stroke/TIA   01/2016 - admitted for gait disability.  MRI negative for acute infarct.  Old infarcts B BG, thalami, L cerebellum.   11/2018 - presented for dizziness and unsteady, MRI showed right frontal and parietal small white matter infarcts. No LVO. Extensive SVD.  Put on DAPT.  Patient signed out AMA  Involuntary movement  Likely due to right thalamic involvement of current stroke  Semi-involuntary during awake, absent during sleep  Not able to afford tetrabenazine  Put on abilify 5->10mg . However, pt developed vomiting episodes x 2 days. Improved after off abilify.  Chorea-like movement slowly improving  Cognitive decline, agitation and restless  CT head unchanged; EEG no seizure  Intermittent restless  Seroquel 25 bid, increase to 25 mg 3 times daily  on aricept to 10mg  daily  Monitor   Hypertension  Home meds:  None listed  Lotensin 5mg  daily stopped 8/8   On Norvasc 5 mg daily  BP stable, goal normotensive    Fever with UTI  9/22 Tmax 102.3->afebrile now  Tylenol prn  WBC normal  Blood Cx NGTD  UCx > 100k staph haemolyticus, >100k enterococcus faecalis  Rocephin 9/24>>9/25. Stopped after discussion with pharmacist that drug is not covering either organism and UA WBC only 0-5 making UTI unlikely.    CXR NAD  Hyperlipidemia  Home meds:  No statin listed  LDL 101, goal < 70  AST and ALT normalized   Continue Lipitor 40  Continue statin on discharge  Diabetes type II, uncontrolled->stable hypoglycemia  Home meds:  glucophage 1000 bid  HgbA1c 9.8, goal < 7.0  Off metformin 1000mg  due to hypoglycemia  CBGs  SSI  sensitive 0-9   put back metformin 500mg  bid   Glucoses remains stable  Dysphagia, resolved . 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  Plan to have inpatient rehab team evaluate patient again as patient may have improved enough to return back home after 1 month of inpatient rehab.  Personally examined patient and images, and have participated in and made any corrections needed to history, physical, neuro exam,assessment and plan as stated above.  I have personally obtained the history, evaluated lab date, reviewed imaging studies and agree with radiology interpretations.   A total of 15 minutes was spent for the care of this patient, spent on counseling patient and family on different diagnostic and therapeutic options, counseling and coordination of care, riskd ans benefits of management, compliance, or risk factor reduction and education.  Pramod  Leonie Man, MD  To contact Stroke Continuity provider, please refer to [email or web address] After hours, contact General Neurology

## 2019-06-02 NOTE — Progress Notes (Signed)
Inpatient Rehabilitation-Admissions Coordinator   Met with pt and his wife and friend [name] for dispo discussion. After lengthy conversation, it was decided that the pt will discharge to CIR for short stay with plan for home with his wife. The pt's wife, [name], confirmed her willingness to provide 24/7 A at DC and is in agreement to CIR despite potential daily cost of care. AC answered all questions from [name] and her husband. Pt indicated agreement for IP Rehab admission. Due to late decision today, will plan for possible admit tomorrow, pending medical clearance and bed availability.   Please call if questions.   Joe Perry, OTR/L  Rehab Admissions Coordinator  [phone] 06/02/2019 4:12 PM

## 2019-06-02 NOTE — TOC Progression Note (Signed)
Transition of Care [facility]) - Progression Note    Patient Details  Name: Joe Perry MRN: [id number] Date of Birth: [birth year]  Transition of Care [facility] CM/SW [place], [place] Phone Number: 06/02/2019, 4:15 PM  Clinical Narrative:   CSW met with patient, patient's wife, patient's brother, and CIR admissions to discuss plans for discharge. Patient's wife agreeable to patient admitting to CIR, will provide 24 hour care upon return home. CIR admissions will follow patient to admit to CIR when bed is available, CSW to remain available for assistance if needed.    Expected Discharge Plan: Skilled Nursing Facility Barriers to Discharge: SNF Pending payor source - LOG(on difficult to place list)  Expected Discharge Plan and Services Expected Discharge Plan: [place] In-house Referral: Clinical Social Work Discharge Planning Services: NA Post Acute Care Choice: [place] Living arrangements for the past 2 months: Single Family Home                 DME Arranged: N/A DME Agency: NA     Representative spoke with at DME Agency: na HH Arranged: NA [place] Agency: NA         Social Determinants of Health (SDOH) Interventions    Readmission Risk Interventions No flowsheet data found.

## 2019-06-02 NOTE — PMR Pre-admission (Signed)
PMR Admission Coordinator Pre-Admission Assessment  Patient: Joe Perry is an 68 y.o., male MRN: [id number] DOB: [birth year] Height: '5\' 9"'  (175.3 cm) Weight: 76 kg  Insurance Information HMO:     PPO:      PCP:      IPA:      80/20:      OTHER:  PRIMARY: Uninsured (Medicaid Pending)      Policy#:       Subscriber:  CM Name:       Phone#:      Fax#:  Pre-Cert#:       Employer:  Benefits:  Phone #:      Name:  Eff. Date:      Deduct:       Out of Pocket Max:       Life Max:  CIR: Pt/family is aware of estimated daily cost of care ($3,500); They have been in contact with financial counselor      SNF:  Outpatient:      Co-Pay:  Home Health:       Co-Pay:  DME:      Co-Pay:  Providers: SECONDARY:       Policy#:       Subscriber:  CM Name:       Phone#:      Fax#:  Pre-Cert#:       Employer: Benefits:  Phone #:      Name:  Eff. Date:      Deduct:       Out of Pocket Max:      Life Max:  CIR:       SNF: Outpatient:      Co-Pay: Home Health:       Co-Pay:  DME:      Co-Pay:   Medicaid Application Date: MAA application submitted July 31st (denied initially)      Financial Counselor: Velora Heckler  Disability Application Date:       Case Worker: MAA application sent over to DSS to Carlyle Basques  The "Data Collection Information Summary" for patients in Inpatient Rehabilitation Facilities with attached "Privacy Act [place] Records" was provided and verbally reviewed with: N/A  Emergency Contact Information Contact Information    Name Relation Home Work Mobile   [place] Spouse   [phone]   Earney Mallet   [phone]      Current Medical History  Patient Admitting Diagnosis: Right large PCA infarct and acute/subacute bilateral infarcts while in hospital  History of Present Illness:  Joe Perry is a 67 year old male with 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 Pt  presented 03/10/2019 with left side weakness as well as altered mental status with right side gaze and left-sided neglect.  Noted systolic blood pressure in the 180s.  Chemistry unremarkable except glucose elevated 242, COVID negative.  Cranial CT scan negative for acute findings.  Noted extensive chronic small vessel ischemic changes throughout the brain as well as old right frontal infarction.  CT angiogram of head and neck showed fetal type right PCA with proximal occlusion as well as 30 to 40% atheromatous narrowing at the right ICA bulb.  Patient did not receive TPA.  Underwent successful mechanical thrombectomy of right posterior cerebral artery per interventional radiology.  Follow-up MRI showed ischemic infarct of the right posterior cerebral artery territory.  No hemorrhage or mass-effect.  Echocardiogram with ejection fraction of 32% normal systolic function.  Hospital course  noted seizure-like episodes/left hemichorea and EEG completed 04/15/2019 showing no seizure activity.  Neurology follow-up currently maintained on aspirin 81 mg daily as well as Brilinta for CVA prophylaxis.  Subcutaneous Lovenox for DVT prophylaxis.  Speech therapy follow-up for confusion with cognitive decline patient was placed on Aricept as well as scheduled Seroquel for bouts of restlessness and agitation.  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  Dysphasia #1 thin liquid diet.  On 05/28/2019 patient spiked a fever 102.3 with urinalysis negative chest x-ray unremarkable and blood cultures no growth to date.  Urine culture did show staph hemolyticus and enterococcus faecalis and placed on Rocephin x5 days with antibiotic completed.  Therapy evaluation  completed and patient is to be admitted for a comprehensive rehab program on 06/03/2019.   Complete NIHSS TOTAL: 5  Patient's medical record from [facility] has been reviewed by the rehabilitation admission coordinator and physician.  Past Medical History  Past Medical History:  Diagnosis Date  . Diabetes mellitus without complication ([place])   . Hypertension   . Stroke [facility])     Family History   family history includes Diabetes in an other family member; Hypertension in an other family member.  Prior Rehab/Hospitalizations Has the patient had prior rehab or hospitalizations prior to admission? Yes  Has the patient had major surgery during 100 days prior to admission? Yes   Current Medications  Current Facility-Administered Medications:  .   stroke: mapping our early stages of recovery book, , Does not apply, Once, Greta Doom, MD .  0.9 %  sodium chloride infusion, , Intravenous, PRN, [name], MD, Last Rate: 10 mL/hr at 05/30/19 0400 .  acetaminophen (TYLENOL) tablet 650 mg, 650 mg, Oral, Q4H PRN, 650 mg at 05/28/19 0025 **OR** [DISCONTINUED] acetaminophen (TYLENOL) solution 650 mg, 650 mg, Per Tube, Q4H PRN **OR** [DISCONTINUED] acetaminophen (TYLENOL) suppository 650 mg, 650 mg, Rectal, Q4H PRN, Deveshwar, Sanjeev, MD .  amLODipine (NORVASC) tablet 5 mg, 5 mg, Oral, Daily, Rinehuls, David L, PA-C, 5 mg at 06/03/19 1033 .  aspirin EC tablet 81 mg, 81 mg, Oral, Daily, Garvin Fila, MD, 81 mg at 06/03/19 1033 .  atorvastatin (LIPITOR) tablet 40 mg, 40 mg, Oral, q1800, Rinehuls, David L, PA-C, 40 mg at 06/02/19 1654 .  bisacodyl (DULCOLAX) suppository 10 mg, 10 mg, Rectal, Daily PRN, [name], MD, 10 mg at 06/01/19 1112 .  diphenhydrAMINE (BENADRYL) capsule 25 mg, 25 mg, Oral, Q6H PRN, Vonzella Nipple, NP, 25 mg at 06/03/19 0011 .  donepezil (ARICEPT) tablet 10 mg, 10 mg, Oral, QHS, [name], MD, 10 mg at 06/02/19 2239 .  enoxaparin  (LOVENOX) injection 40 mg, 40 mg, Subcutaneous, Q24H, [name], MD, 40 mg at 06/02/19 1654 .  insulin aspart (novoLOG) injection 0-9 Units, 0-9 Units, Subcutaneous, TID WC, Biby, Sharon L, NP, 2 Units at 06/02/19 1255 .  magic mouthwash, 10 mL, Oral, QID PRN, Garvin Fila, MD, 10 mL at 04/07/19 1245 .  metFORMIN (GLUCOPHAGE) tablet 500 mg, 500 mg, Oral, BID WC, [name], MD, 500 mg at 06/03/19 1032 .  ondansetron (ZOFRAN) injection 4 mg, 4 mg, Intravenous, Q8H PRN, Aroor, Lanice Schwab, MD, 4 mg at 05/11/19 0211 .  ondansetron (ZOFRAN)  tablet 8 mg, 8 mg, Oral, Q8H PRN, [name], MD, 8 mg at 05/11/19 2136 .  QUEtiapine (SEROQUEL) tablet 25 mg, 25 mg, Oral, TID, [name], MD, 25 mg at 06/03/19 1033 .  senna-docusate (Senokot-S) tablet 1 tablet, 1 tablet, Oral, QHS, [name], MD, 1 tablet at 06/02/19 2239 .  sodium phosphate (FLEET) 7-19 GM/118ML enema 1 enema, 1 enema, Rectal, Daily PRN, Melvenia Beam, MD, 1 enema at 06/01/19 1148 .  ticagrelor (BRILINTA) tablet 90 mg, 90 mg, Oral, BID, Garvin Fila, MD, 90 mg at 06/03/19 1033  Patients Current Diet:  Diet Order            DIET - DYS 1 Room service appropriate? No; Fluid consistency: Thin  Diet effective now              Precautions / Restrictions Precautions Precautions: Fall Precaution Comments: Left lateral lean, Left hemianopsia, Left inattention, impulsive Restrictions Weight Bearing Restrictions: No   Has the patient had 2 or more falls or a fall with injury in the past year? 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 Independent  Indoor Mobility: Did the patient need assistance with walking from room to room (with or without device)? Independent  Stairs: Did the patient need assistance with internal or external stairs (with or  without device)? Independent  Functional Cognition: Did the patient need help planning regular tasks such as shopping or remembering to take medications? Independent  Home Assistive Devices / Equipment Home Assistive Devices/Equipment: None Home Equipment: None  Prior Device Use: Indicate devices/aids used by the patient prior to current illness, exacerbation or injury? None of the above  Current Functional Level Cognition  Overall Cognitive Status: Impaired/Different from baseline Difficult to assess due to: Level of arousal Current Attention Level: Sustained Orientation Level: Oriented to person Following Commands: Follows one step commands inconsistently Safety/Judgement: Decreased awareness of safety, Decreased awareness of deficits General Comments: Pt is perseverative, he self disracts frequently  Attention: Focused, Sustained Focused Attention: Appears intact Sustained Attention: Impaired Sustained Attention Impairment: Verbal basic, Functional basic Memory: Impaired Memory Impairment: Storage deficit, Retrieval deficit, Decreased recall of new information Awareness: Impaired Awareness Impairment: Intellectual impairment    Extremity Assessment (includes Sensation/Coordination)  Upper Extremity Assessment: Generalized weakness, LUE deficits/detail RUE Deficits / Details: AROM appears WFL.  He, however, perseverated on scooping syrup and food onto spoon, and was unable to extinguish behavior  LUE Deficits / Details: chorea present, but he is able to engage Lt UE as an active assist during ADLs  LUE Coordination: decreased fine motor, decreased gross motor  Lower Extremity Assessment: Defer to PT evaluation LLE Deficits / Details: left leg is functionally weaker than R leg.  Seated MMT was bassically equal with decreased coordination on left, increased foot drag on left.      ADLs  Overall ADL's : Needs assistance/impaired Eating/Feeding: Sitting Eating/Feeding Details  (indicate cue type and reason): Pt able to feed self with utensil utilizing R UE but needing cuing to swallow,unable to locate items to the L >midline of tray. Pt needing suctioning secondary to oral holding of liquids. Grooming: Oral care, Minimal assistance, Standing Grooming Details (indicate cue type and reason): Pt proceeded to brush teeth without toothpaste.  He required mod cues and min A to put toothpaste on toothbrush  Upper Body Bathing: Set up,  Sitting Upper Body Bathing Details (indicate cue type and reason): Pt unable to specify that he was nauseous. before vomitting. Lower Body Bathing: Maximal assistance, Sit to/from stand Lower Body Bathing Details (indicate cue type and reason): assist for thoroughness  Upper Body Dressing : Minimal assistance, Bed level, Cueing for sequencing Upper Body Dressing Details (indicate cue type and reason): don/doff gown Lower Body Dressing: Moderate assistance, +2 for safety/equipment Lower Body Dressing Details (indicate cue type and reason): Pt able to don/doff socks with min guard assist  Toilet Transfer: Moderate assistance, Ambulation, Comfort height toilet, Grab bars Toilet Transfer Details (indicate cue type and reason): maxA for hygiene Toileting- Clothing Manipulation and Hygiene: Maximal assistance, Sit to/from stand Toileting - Clothing Manipulation Details (indicate cue type and reason): Pt incontinent of stool, and required max A for clean up and peri care  Functional mobility during ADLs: Moderate assistance General ADL Comments: continues to require consistent 1 step multimodal cues for organization and sequencing. Pt impulsive and perseverative. Placed BADL items on the L to facilitate tracking    Mobility  Overal bed mobility: Needs Assistance Bed Mobility: Supine to Sit Rolling: Min guard Supine to sit: Min guard Sit to supine: Min guard General bed mobility comments: Pt initially repeatedly pulled self toward Rt bedrails.  He  required mod cues to move toward the left side of the bed     Transfers  Overall transfer level: Needs assistance Equipment used: Rolling walker (2 wheeled) Transfers: Sit to/from Stand, [employer] Transfers Sit to Stand: Min assist Stand pivot transfers: Mod assist General transfer comment: Pt requires assist to steady.  He looses his balance to the Lt and is very impulsive     Ambulation / Gait / Stairs / Wheelchair Mobility  Ambulation/Gait Ambulation/Gait assistance: Mod assist, +2 safety/equipment Gait Distance (Feet): 180 Feet Assistive device: Rolling walker (2 wheeled) Gait Pattern/deviations: Narrow base of support, Decreased stride length, Step-through pattern, Drifts right/left General Gait Details: assistance required for balance and guiding RW; cues for navigating hallway and attempted to work on scanning; cues for upright posture and forward gaze Gait velocity: decreased Gait velocity interpretation: <1.31 ft/sec, indicative of household ambulator    Posture / Balance Dynamic Sitting Balance Sitting balance - Comments: maintained balance while changed wet gown and even able to lift each foot and maintain balance (donning brief) Balance Overall balance assessment: Needs assistance Sitting-balance support: Feet supported Sitting balance-[name]: Fair Sitting balance - Comments: maintained balance while changed wet gown and even able to lift each foot and maintain balance (donning brief) Postural control: Posterior lean, Left lateral lean Standing balance support: No upper extremity supported Standing balance-[name]: Poor Standing balance comment: requires min A for static standing  High Level Balance Comments: Pt able to engage in ball throwing task at both seated, and standing levels - required assist in standing    Special needs/care consideration BiPAP/CPAP : no CPM : no Continuous Drip IV : no Dialysis : no        Days :no Life Vest : no Oxygen :  no Special Bed : high-low bed with floor pads Trach Size : no Wound Vac (area) : no      Location : no Skin: abrasion to right arm, hand, finger; skin tear to right hand, finger.                          Bowel mgmt: incontinence, last BM: 06/01/2019 Bladder mgmt: incontinent Diabetic  mgmt: yes Behavioral consideration : restless, requires telesitter Chemo/radiation : no   Previous Home Environment (from acute therapy documentation) Living Arrangements: Spouse/significant other  Lives With: Spouse Available Help at Discharge: Friend(s), Available PRN/intermittently Type of Home: House Home Layout: One level Home Access: Stairs to enter Entrance Stairs-Rails: Right, Left, Can reach both Entrance Stairs-Number of Steps: 2 Home Care Services: No Additional Comments: home info from pt who may not be reliable.  Discharge Living Setting Plans for Discharge Living Setting: Patient's home, Lives with (comment)(wife) Type of Home at Discharge: House Discharge Home Layout: One level Discharge Home Access: Stairs to enter Entrance Stairs-Rails: Right, Left Entrance Stairs-Number of Steps: 3 steps Discharge Bathroom Shower/Tub: Tub/shower unit Discharge Bathroom Toilet: Standard Discharge Bathroom Accessibility: Yes How Accessible: Accessible via walker Does the patient have any problems obtaining your medications?: No  Social/Family/Support Systems Patient Roles: Spouse Contact Information: wife [name]: [phone]; friend [name]: [phone] Anticipated Caregiver: wife and [name] Anticipated Caregiver's Contact Information: see above Ability/Limitations of Caregiver: Min A Caregiver Availability: 24/7 Discharge Plan Discussed with Primary Caregiver: Yes Is Caregiver In Agreement with Plan?: Yes Does Caregiver/Family have Issues with Lodging/Transportation while Pt is in Rehab?: No  Designated Visitor: Recruitment consultant (wife)  Goals/Additional Needs Patient/Family Goal for Rehab: PT/OT:  Supervision; SLP: Supervision Expected length of stay: 10-14 days Cultural Considerations: NA Dietary Needs: DYS 1, thin liquids Equipment Needs: TBD Special Service Needs: telesitter  Pt/Family Agrees to Admission and willing to participate: Yes Program Orientation Provided & Reviewed with Pt/Caregiver Including Roles  & Responsibilities: Yes  Barriers to Discharge: Home environment access/layout, Incontinence, Insurance for SNF coverage, Other (comments)  Barriers to Discharge Comments: steps to enter; restlessness (need for telesitter), uninsured  Decrease burden of Care through IP rehab admission: NA  Possible need for SNF placement upon discharge: Not anticipated; pt has 24/7 support from his wife who understands that he will require constant supervision after short CIR stay and will return home with her and with support from friend [name].   Patient Condition: I have reviewed medical records from [facility], spoken with CSW, and patient and spouse and close family friend, [name]. I met with patient at the bedside for inpatient rehabilitation assessment.  Patient will benefit from ongoing PT, OT and SLP, can actively participate in 3 hours of therapy a day 5 days of the week, and can make measurable gains during the admission.  Patient will also benefit from the coordinated team approach during an Inpatient Acute Rehabilitation admission.  The patient will receive intensive therapy as well as Rehabilitation physician, nursing, social worker, and care management interventions.  Due to bladder management, bowel management, safety, skin/wound care, disease management, medication administration, pain management and patient education the patient requires 24 hour a day rehabilitation nursing.  The patient is currently Mod A x2 with mobility and Min A to Mod A for basic ADLs.  Discharge setting and therapy post discharge at home with home health is anticipated.  Patient has agreed to  participate in the Acute Inpatient Rehabilitation Program and will admit 06/03/2019.  Preadmission Screen Completed By:  [name], 06/03/2019 10:42 AM ______________________________________________________________________   Discussed status with Dr. Naaman Plummer on 06/03/2019 at 10:40AM and received approval for admission today.  Admission Coordinator:  [name], OT, time 10:40AM/Date 06/03/2019   Assessment/Plan: Diagnosis: right PCA infarct 1. Does the need for close, 24 hr/day Medical supervision in concert with the patient's rehab needs make it unreasonable for this patient to be served in a  less intensive setting? Yes 2. Co-Morbidities requiring supervision/potential complications: HTN, post-stroke sequelae 3. Due to bladder management, bowel management, safety, skin/wound care, disease management, medication administration, pain management and patient education, does the patient require 24 hr/day rehab nursing? Yes 4. Does the patient require coordinated care of a physician, rehab nurse, PT (1-2 hrs/day, 5 days/week), OT (1-2 hrs/day, 5 days/week) and SLP (1-2 hrs/day, 5 days/week) to address physical and functional deficits in the context of the above medical diagnosis(es)? Yes Addressing deficits in the following areas: balance, endurance, locomotion, strength, transferring, bowel/bladder control, bathing, dressing, feeding, grooming, toileting, cognition, speech, language and psychosocial support 5. Can the patient actively participate in an intensive therapy program of at least 3 hrs of therapy 5 days a week? Yes 6. The potential for patient to make measurable gains while on inpatient rehab is excellent 7. Anticipated functional outcomes upon discharge from inpatients are: supervision PT, supervision OT, supervision SLP 8. Estimated rehab length of stay to reach the above functional goals is: 10-14 days 9. Anticipated D/C setting: Home 10. Anticipated post D/C treatments: [place]  therapy 11. Overall Rehab/Functional Prognosis: excellent  MD Signature: Meredith Staggers, MD, [facility] 06/03/2019

## 2019-06-02 NOTE — Progress Notes (Signed)
Occupational Therapy Treatment Patient Details Name: Joe Perry MRN: [id number] DOB: Jul 21, 1951 Today's Date: 06/02/2019    History of present illness 68 yo male s/p  R PCOM and R PCA occlusion s/p emergent thrombectomy on 7/6. Pt admitteed with L sided weakness, R gaze perference, L homonymous hemianopsia. 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 MRI 8/28 showed new acute to subacute infarcts in the left BG and right cerebellar peduncle.   OT comments  Pt with improved attention and ability to perform simple grooming activities.  He requires min A for grooming standing at the sink, and was able to don/doff socks EOB with min guard assist.  He requires mod A for functional transfers.  Goals updated.    Follow Up Recommendations  CIR;Supervision/Assistance - 24 hour    Equipment Recommendations  None recommended by OT    Recommendations for Other Services Rehab consult    Precautions / Restrictions Precautions Precautions: Fall Precaution Comments: Left lateral lean, Left hemianopsia, Left inattention, impulsive Restrictions Weight Bearing Restrictions: No       Mobility Bed Mobility Overal bed mobility: Needs Assistance Bed Mobility: Supine to Sit     Supine to sit: Min guard     General bed mobility comments: Pt initially repeatedly pulled self toward Rt bedrails.  He required mod cues to move toward the left side of the bed   Transfers Overall transfer level: Needs assistance Equipment used: Rolling walker (2 wheeled) Transfers: Sit to/from [facility] Sit to Stand: Min assist Stand pivot transfers: Mod assist       General transfer comment: Pt requires assist to steady.  He looses his balance to the Lt and is very impulsive     Balance Overall balance assessment: Needs assistance Sitting-balance support: Feet supported Sitting balance-[name]: Fair     Standing balance support: No upper extremity supported Standing balance-Leahy  Scale: Poor Standing balance comment: requires min A for static standing                            ADL either performed or assessed with clinical judgement   ADL Overall ADL's : Needs assistance/impaired     Grooming: Oral care;Minimal assistance;Standing Grooming Details (indicate cue type and reason): Pt proceeded to brush teeth without toothpaste.  He required mod cues and min A to put toothpaste on toothbrush                Lower Body Dressing Details (indicate cue type and reason): Pt able to don/doff socks with min guard assist  Toilet Transfer: Moderate assistance;Ambulation;Comfort height toilet;Grab bars           Functional mobility during ADLs: Moderate assistance       Vision   Additional Comments: Pt requires cues to scan to the Lt    Perception     Praxis      Cognition Arousal/Alertness: Awake/alert Behavior During Therapy: Impulsive;Restless Overall Cognitive Status: Impaired/Different from baseline Area of Impairment: Orientation;Attention;Memory;Following commands;Safety/judgement;Awareness;Problem solving                 Orientation Level: Disoriented to;Time;Situation;Place Current Attention Level: Sustained Memory: Decreased short-term memory Following Commands: Follows one step commands inconsistently Safety/Judgement: Decreased awareness of safety;Decreased awareness of deficits   Problem Solving: Requires verbal cues;Decreased initiation;Requires tactile cues General Comments: Pt is perseverative, he self disracts frequently         Exercises     Shoulder Instructions  General Comments      Pertinent Vitals/ Pain       Pain Assessment: No/denies pain  Home Living                                          Prior Functioning/Environment              Frequency  Min 2X/week        Progress Toward Goals  OT Goals(current goals can now be found in the care plan section)   Progress towards OT goals: Progressing toward goals  ADL Goals Pt Will Perform Grooming: with min guard assist;standing Pt Will Perform Upper Body Bathing: with min assist;sitting Pt Will Perform Lower Body Bathing: with min assist;sit to/from stand Pt Will Perform Upper Body Dressing: with mod assist;sitting Pt Will Perform Lower Body Dressing: sit to/from stand;with min assist Pt Will Transfer to Toilet: with min assist;ambulating;regular height toilet;grab bars Additional ADL Goal #1: Pt will complete ADLs with min cues  Plan Discharge plan needs to be updated;Frequency remains appropriate    Co-evaluation                 AM-PAC OT "6 Clicks" Daily Activity     Outcome Measure   Help from another person eating meals?: A Lot Help from another person taking care of personal grooming?: A Little Help from another person toileting, which includes using toliet, bedpan, or urinal?: A Lot Help from another person bathing (including washing, rinsing, drying)?: A Lot Help from another person to put on and taking off regular upper body clothing?: A Lot Help from another person to put on and taking off regular lower body clothing?: A Lot 6 Click Score: 13    End of Session Equipment Utilized During Treatment: Gait belt  OT Visit Diagnosis: Unsteadiness on feet (R26.81);Low vision, both eyes (H54.2);Other symptoms and signs involving cognitive function;Other symptoms and signs involving the nervous system (R29.898) Symptoms and signs involving cognitive functions: Cerebral infarction   Activity Tolerance Patient tolerated treatment well   Patient Left in bed;with call bell/phone within reach;with bed alarm set   Nurse Communication Mobility status        Time: [id number] OT Time Calculation (min): 12 min  Charges: OT General Charges $OT Visit: 1 Visit OT Treatments $Self Care/Home Management : 8-22 mins  Lucille Passy, OTR/L [place] Pager  [phone] Office [phone]    Lucille Passy M 06/02/2019, 4:49 PM

## 2019-06-03 ENCOUNTER — Other Ambulatory Visit: Payer: Self-pay

## 2019-06-03 ENCOUNTER — Encounter (HOSPITAL_COMMUNITY): Payer: Self-pay

## 2019-06-03 ENCOUNTER — Inpatient Hospital Stay (HOSPITAL_COMMUNITY)
Admission: RE | Admit: 2019-06-03 | Discharge: 2019-06-20 | DRG: 057 | Disposition: A | Discharge status: Home Health | Payer: Medicare Other | Source: Intra-hospital | Attending: Physical Medicine & Rehabilitation | Admitting: Physical Medicine & Rehabilitation

## 2019-06-03 DIAGNOSIS — D62 Acute posthemorrhagic anemia: Secondary | ICD-10-CM | POA: Diagnosis not present

## 2019-06-03 DIAGNOSIS — Z23 Encounter for immunization: Secondary | ICD-10-CM | POA: Diagnosis not present

## 2019-06-03 DIAGNOSIS — I69393 Ataxia following cerebral infarction: Secondary | ICD-10-CM | POA: Diagnosis not present

## 2019-06-03 DIAGNOSIS — I10 Essential (primary) hypertension: Secondary | ICD-10-CM | POA: Diagnosis present

## 2019-06-03 DIAGNOSIS — I69391 Dysphagia following cerebral infarction: Secondary | ICD-10-CM

## 2019-06-03 DIAGNOSIS — I6329 Cerebral infarction due to unspecified occlusion or stenosis of other precerebral arteries: Secondary | ICD-10-CM | POA: Diagnosis present

## 2019-06-03 DIAGNOSIS — E785 Hyperlipidemia, unspecified: Secondary | ICD-10-CM | POA: Diagnosis present

## 2019-06-03 DIAGNOSIS — R040 Epistaxis: Secondary | ICD-10-CM | POA: Diagnosis not present

## 2019-06-03 DIAGNOSIS — Z7984 Long term (current) use of oral hypoglycemic drugs: Secondary | ICD-10-CM | POA: Diagnosis not present

## 2019-06-03 DIAGNOSIS — D333 Benign neoplasm of cranial nerves: Secondary | ICD-10-CM | POA: Diagnosis present

## 2019-06-03 DIAGNOSIS — R1310 Dysphagia, unspecified: Secondary | ICD-10-CM | POA: Diagnosis present

## 2019-06-03 DIAGNOSIS — R451 Restlessness and agitation: Secondary | ICD-10-CM | POA: Diagnosis not present

## 2019-06-03 DIAGNOSIS — E119 Type 2 diabetes mellitus without complications: Secondary | ICD-10-CM

## 2019-06-03 DIAGNOSIS — R7309 Other abnormal glucose: Secondary | ICD-10-CM

## 2019-06-03 DIAGNOSIS — Z7902 Long term (current) use of antithrombotics/antiplatelets: Secondary | ICD-10-CM

## 2019-06-03 DIAGNOSIS — E1165 Type 2 diabetes mellitus with hyperglycemia: Secondary | ICD-10-CM | POA: Diagnosis not present

## 2019-06-03 DIAGNOSIS — I69319 Unspecified symptoms and signs involving cognitive functions following cerebral infarction: Secondary | ICD-10-CM

## 2019-06-03 DIAGNOSIS — Z7982 Long term (current) use of aspirin: Secondary | ICD-10-CM

## 2019-06-03 DIAGNOSIS — I69354 Hemiplegia and hemiparesis following cerebral infarction affecting left non-dominant side: Secondary | ICD-10-CM | POA: Diagnosis present

## 2019-06-03 DIAGNOSIS — N390 Urinary tract infection, site not specified: Secondary | ICD-10-CM

## 2019-06-03 DIAGNOSIS — R0989 Other specified symptoms and signs involving the circulatory and respiratory systems: Secondary | ICD-10-CM

## 2019-06-03 DIAGNOSIS — F8081 Childhood onset fluency disorder: Secondary | ICD-10-CM

## 2019-06-03 DIAGNOSIS — A499 Bacterial infection, unspecified: Secondary | ICD-10-CM

## 2019-06-03 HISTORY — DX: Cerebral infarction due to unspecified occlusion or stenosis of other precerebral arteries: I63.29

## 2019-06-03 LAB — CREATININE, SERUM
Creatinine, Ser: 0.65 mg/dL (ref 0.61–1.24)
GFR calc Af Amer: >60 mL/min (ref >60)
GFR calc non Af Amer: >60 mL/min (ref >60)

## 2019-06-03 LAB — CBC
HCT: 33 % — ABNORMAL LOW (ref 39.0–52.0)
Hemoglobin: 11.3 g/dL — ABNORMAL LOW (ref 13.0–17.0)
MCH: 31.3 pg (ref 26.0–34.0)
MCHC: 34.2 g/dL (ref 30.0–36.0)
MCV: 91.4 fL (ref 80.0–100.0)
Platelets: 347 10*3/uL (ref 150–400)
RBC: 3.61 MIL/uL — ABNORMAL LOW (ref 4.22–5.81)
RDW: 12.8 % (ref 11.5–15.5)
WBC: 5.4 10*3/uL (ref 4.0–10.5)
nRBC: 0 % (ref 0.0–0.2)

## 2019-06-03 LAB — GLUCOSE, CAPILLARY
Glucose-Capillary: 88 mg/dL (ref 70–99)
Glucose-Capillary: 156 mg/dL — ABNORMAL HIGH (ref 70–99)
Glucose-Capillary: 71 mg/dL (ref 70–99)
Glucose-Capillary: 157 mg/dL — ABNORMAL HIGH (ref 70–99)

## 2019-06-03 MED ORDER — SENNOSIDES-DOCUSATE SODIUM 8.6-50 MG PO TABS
1.0000 | ORAL_TABLET | Freq: Every day | ORAL | Status: DC
  Administered 2019-06-03 – 2019-06-19 (×17): 1 via ORAL
  Filled 2019-06-03 (×17): qty 1

## 2019-06-03 MED ORDER — ONDANSETRON HCL 4 MG PO TABS: 8.0000 mg | ORAL_TABLET | Freq: Three times a day (TID) | ORAL | Status: DC | PRN

## 2019-06-03 MED ORDER — BISACODYL 10 MG RE SUPP
10.0000 mg | Freq: Every day | RECTAL | Status: DC | PRN
  Administered 2019-06-05: 10 mg via RECTAL
  Filled 2019-06-03: qty 1

## 2019-06-03 MED ORDER — ATORVASTATIN CALCIUM 40 MG PO TABS
40.0000 mg | ORAL_TABLET | Freq: Every day | ORAL | Status: DC
  Administered 2019-06-03 – 2019-06-19 (×17): 40 mg via ORAL
  Filled 2019-06-03 (×17): qty 1

## 2019-06-03 MED ORDER — DONEPEZIL HCL 10 MG PO TABS: 10.0000 mg | ORAL_TABLET | Freq: Every day | ORAL | Status: DC

## 2019-06-03 MED ORDER — QUETIAPINE FUMARATE 25 MG PO TABS: 25.0000 mg | ORAL_TABLET | Freq: Three times a day (TID) | ORAL | Status: DC

## 2019-06-03 MED ORDER — ACETAMINOPHEN 325 MG PO TABS
650.0000 mg | ORAL_TABLET | ORAL | Status: DC | PRN
  Administered 2019-06-10: 650 mg via ORAL
  Filled 2019-06-03 (×2): qty 2

## 2019-06-03 MED ORDER — QUETIAPINE FUMARATE 25 MG PO TABS
25.0000 mg | ORAL_TABLET | Freq: Three times a day (TID) | ORAL | Status: DC
  Administered 2019-06-03 – 2019-06-20 (×50): 25 mg via ORAL
  Filled 2019-06-03 (×51): qty 1

## 2019-06-03 MED ORDER — SENNOSIDES-DOCUSATE SODIUM 8.6-50 MG PO TABS: 1.0000 | ORAL_TABLET | Freq: Every day | ORAL | Status: DC

## 2019-06-03 MED ORDER — SORBITOL 70 % SOLN
30.0000 mL | Freq: Every day | Status: DC | PRN
  Administered 2019-06-04 – 2019-06-18 (×3): 30 mL via ORAL
  Filled 2019-06-03 (×3): qty 30

## 2019-06-03 MED ORDER — INSULIN ASPART 100 UNIT/ML ~~LOC~~ SOLN
0.0000 [IU] | Freq: Three times a day (TID) | SUBCUTANEOUS | Status: DC
  Administered 2019-06-04: 3 [IU] via SUBCUTANEOUS
  Administered 2019-06-04 – 2019-06-06 (×4): 1 [IU] via SUBCUTANEOUS
  Administered 2019-06-07: 18:00:00 2 [IU] via SUBCUTANEOUS
  Administered 2019-06-08: 1 [IU] via SUBCUTANEOUS
  Administered 2019-06-08: 17:00:00 2 [IU] via SUBCUTANEOUS
  Administered 2019-06-09 – 2019-06-11 (×3): 1 [IU] via SUBCUTANEOUS
  Administered 2019-06-11: 18:00:00 2 [IU] via SUBCUTANEOUS
  Administered 2019-06-12 – 2019-06-14 (×3): 1 [IU] via SUBCUTANEOUS
  Administered 2019-06-14: 2 [IU] via SUBCUTANEOUS
  Administered 2019-06-15 – 2019-06-17 (×4): 1 [IU] via SUBCUTANEOUS

## 2019-06-03 MED ORDER — DONEPEZIL HCL 10 MG PO TABS
10.0000 mg | ORAL_TABLET | Freq: Every day | ORAL | Status: DC
  Administered 2019-06-03 – 2019-06-19 (×16): 10 mg via ORAL
  Filled 2019-06-03 (×17): qty 1

## 2019-06-03 MED ORDER — ENOXAPARIN SODIUM 40 MG/0.4ML ~~LOC~~ SOLN: 40.0000 mg | SUBCUTANEOUS | Status: DC

## 2019-06-03 MED ORDER — ASPIRIN EC 81 MG PO TBEC
81.0000 mg | DELAYED_RELEASE_TABLET | Freq: Every day | ORAL | Status: DC
  Administered 2019-06-04 – 2019-06-16 (×13): 81 mg via ORAL
  Filled 2019-06-03 (×13): qty 1

## 2019-06-03 MED ORDER — METFORMIN HCL 500 MG PO TABS
500.0000 mg | ORAL_TABLET | Freq: Two times a day (BID) | ORAL | Status: DC
  Administered 2019-06-03 – 2019-06-20 (×34): 500 mg via ORAL
  Filled 2019-06-03 (×34): qty 1

## 2019-06-03 MED ORDER — TICAGRELOR 90 MG PO TABS
90.0000 mg | ORAL_TABLET | Freq: Two times a day (BID) | ORAL | Status: DC
  Administered 2019-06-03 – 2019-06-20 (×34): 90 mg via ORAL
  Filled 2019-06-03 (×34): qty 1

## 2019-06-03 MED ORDER — AMLODIPINE BESYLATE 5 MG PO TABS
5.0000 mg | ORAL_TABLET | Freq: Every day | ORAL | Status: DC
  Administered 2019-06-04 – 2019-06-20 (×17): 5 mg via ORAL
  Filled 2019-06-03 (×17): qty 1

## 2019-06-03 MED ORDER — TICAGRELOR 90 MG PO TABS: 90.0000 mg | ORAL_TABLET | Freq: Two times a day (BID) | ORAL | Status: DC

## 2019-06-03 MED ORDER — ATORVASTATIN CALCIUM 40 MG PO TABS: 40.0000 mg | ORAL_TABLET | Freq: Every day | ORAL | Status: DC

## 2019-06-03 MED ORDER — ENOXAPARIN SODIUM 40 MG/0.4ML ~~LOC~~ SOLN
40.0000 mg | SUBCUTANEOUS | Status: DC
  Administered 2019-06-04 – 2019-06-20 (×17): 40 mg via SUBCUTANEOUS
  Filled 2019-06-03 (×17): qty 0.4

## 2019-06-03 MED ORDER — AMLODIPINE BESYLATE 5 MG PO TABS: 5.0000 mg | ORAL_TABLET | Freq: Every day | ORAL | Status: DC

## 2019-06-03 MED ORDER — METFORMIN HCL 500 MG PO TABS: 500.0000 mg | ORAL_TABLET | Freq: Two times a day (BID) | ORAL | Status: DC

## 2019-06-03 MED ORDER — INSULIN ASPART 100 UNIT/ML ~~LOC~~ SOLN: 0.0000 [IU] | Freq: Three times a day (TID) | SUBCUTANEOUS | 11 refills | Status: DC

## 2019-06-03 NOTE — Progress Notes (Addendum)
Meredith Staggers, MD  Physician  Physical Medicine and Rehabilitation  PMR Pre-admission  Signed  Date of Service:  06/02/2019 5:24 PM      Related encounter: ED to Hosp-Admission (Discharged) from 03/10/2019 in [facility]      Signed         PMR Admission Coordinator Pre-Admission Assessment  Patient: Joe Perry is an 68 y.o., male MRN: [id number] DOB: 10/09/1950 Height: '5\' 9"'  (175.3 cm) Weight: 76 kg  Insurance Information HMO:     PPO:      PCP:      IPA:      80/20:      OTHER:  PRIMARY: Uninsured (Medicaid Pending)      Policy#:       Subscriber:  CM Name:       Phone#:      Fax#:  Pre-Cert#:       Employer:  Benefits:  Phone #:      Name:  Eff. Date:      Deduct:       Out of Pocket Max:       Life Max:  CIR: Pt/family is aware of estimated daily cost of care ($3,500); They have been in contact with financial counselor      SNF:  Outpatient:      Co-Pay:  Home Health:       Co-Pay:  DME:      Co-Pay:  Providers: SECONDARY:       Policy#:       Subscriber:  CM Name:       Phone#:      Fax#:  Pre-Cert#:       Employer: Benefits:  Phone #:      Name:  Eff. Date:      Deduct:       Out of Pocket Max:      Life Max:  CIR:       SNF: Outpatient:      Co-Pay: Home Health:       Co-Pay:  DME:      Co-Pay:   Medicaid Application Date: MAA application submitted July 31st (denied initially)      Financial Counselor: Velora Heckler  Disability Application Date:       Case Worker: MAA application sent over to DSS to Carlyle Basques  The Data Collection Information Summary for patients in Inpatient Rehabilitation Facilities with attached Privacy Act [place] Records was provided and verbally reviewed with: N/A  Emergency Contact Information         Contact Information    Name Relation Home Work Mobile   Bove,bola Spouse   [phone]             Current Medical History  Patient Admitting Diagnosis: Right large  PCA infarct and acute/subacute bilateral infarcts while in hospital  History of Present Illness: Joe Perry a 68 year old male with 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 Pt presented 03/10/2019 with left side weakness as well as altered mental status with right side gaze and left-sided neglect. Noted systolic blood pressure in the 180s. Chemistry unremarkable except glucose elevated 242, COVID negative. Cranial CT scan negative for acute findings. Noted extensive chronic small vessel ischemic changes throughout the brain as well as old right frontal infarction. CT angiogram of head and neck showed fetal type right PCA with proximal occlusion as well as 30 to 40% atheromatousnarrowing  at the right ICA bulb. Patient did not receive TPA. Underwent successful mechanical thrombectomy of right posterior cerebral artery per interventional radiology. Follow-up MRI showed ischemic infarct of the right posterior cerebral artery territory. No hemorrhage or mass-effect. Echocardiogram with ejection fraction of 62% normal systolic function. Hospital course noted seizure-like episodes/lefthemichoreaand EEG completed 04/15/2019 showing no seizure activity. Neurology follow-up currently maintained on aspirin 81 mg daily as well as Brilinta for CVA prophylaxis. Subcutaneous Lovenox for DVT prophylaxis. Speech therapy follow-up for confusion with cognitive decline patient was placed on Aricept as well as scheduled Seroquel for bouts of restlessness and agitation. 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 Dysphasia #1 thin liquid diet. On 05/28/2019  patient spiked a fever 102.3 with urinalysis negative chest x-ray unremarkable and blood cultures no growth to date. Urine culture did show staph hemolyticusand enterococcus faecalisand placed on Rocephin x5 days with antibiotic completed. Therapy evaluation completed and patient is to be admitted for a comprehensive rehab program on 06/03/2019.   Complete NIHSS TOTAL: 5  Patient's medical record from [facility] has been reviewed by the rehabilitation admission coordinator and physician.  Past Medical History      Past Medical History:  Diagnosis Date   Diabetes mellitus without complication ([place])    Hypertension    Stroke ([place])     Family History   family history includes Diabetes in an other family member; Hypertension in an other family member.  Prior Rehab/Hospitalizations Has the patient had prior rehab or hospitalizations prior to admission? Yes  Has the patient had major surgery during 100 days prior to admission? Yes             Current Medications  Current Facility-Administered Medications:     stroke: mapping our early stages of recovery book, , Does not apply, Once, Greta Doom, MD   0.9 %  sodium chloride infusion, , Intravenous, PRN, [name], MD, Last Rate: 10 mL/hr at 05/30/19 0400   acetaminophen (TYLENOL) tablet 650 mg, 650 mg, Oral, Q4H PRN, 650 mg at 05/28/19 0025 **OR** [DISCONTINUED] acetaminophen (TYLENOL) solution 650 mg, 650 mg, Per Tube, Q4H PRN **OR** [DISCONTINUED] acetaminophen (TYLENOL) suppository 650 mg, 650 mg, Rectal, Q4H PRN, Deveshwar, Sanjeev, MD   amLODipine (NORVASC) tablet 5 mg, 5 mg, Oral, Daily, Rinehuls, David L, PA-C, 5 mg at 06/03/19 1033   aspirin EC tablet 81 mg, 81 mg, Oral, Daily, Sethi, Pramod S, MD, 81 mg at 06/03/19 1033   atorvastatin (LIPITOR) tablet 40 mg, 40 mg, Oral, q1800, Rinehuls, David L, PA-C, 40 mg at 06/02/19 1654   bisacodyl (DULCOLAX) suppository 10 mg, 10 mg,  Rectal, Daily PRN, [name], MD, 10 mg at 06/01/19 1112   diphenhydrAMINE (BENADRYL) capsule 25 mg, 25 mg, Oral, Q6H PRN, Vonzella Nipple, NP, 25 mg at 06/03/19 0011   donepezil (ARICEPT) tablet 10 mg, 10 mg, Oral, QHS, [name], MD, 10 mg at 06/02/19 2239   enoxaparin (LOVENOX) injection 40 mg, 40 mg, Subcutaneous, Q24H, [name], MD, 40 mg at 06/02/19 1654   insulin aspart (novoLOG) injection 0-9 Units, 0-9 Units, Subcutaneous, TID WC, Biby, Sharon L, NP, 2 Units at 06/02/19 1255   magic mouthwash, 10 mL, Oral, QID PRN, Garvin Fila, MD,  10 mL at 04/07/19 1245   metFORMIN (GLUCOPHAGE) tablet 500 mg, 500 mg, Oral, BID WC, [name], MD, 500 mg at 06/03/19 1032   ondansetron (ZOFRAN) injection 4 mg, 4 mg, Intravenous, Q8H PRN, Aroor, Karena Addison R, MD, 4 mg at 05/11/19 0211   ondansetron (ZOFRAN) tablet 8 mg, 8 mg, Oral, Q8H PRN, [name], MD, 8 mg at 05/11/19 2136   QUEtiapine (SEROQUEL) tablet 25 mg, 25 mg, Oral, TID, [name], MD, 25 mg at 06/03/19 1033   senna-docusate (Senokot-S) tablet 1 tablet, 1 tablet, Oral, QHS, [name], MD, 1 tablet at 06/02/19 2239   sodium phosphate (FLEET) 7-19 GM/118ML enema 1 enema, 1 enema, Rectal, Daily PRN, Sarina Ill B, MD, 1 enema at 06/01/19 1148   ticagrelor (BRILINTA) tablet 90 mg, 90 mg, Oral, BID, Garvin Fila, MD, 90 mg at 06/03/19 1033  Patients Current Diet:     Diet Order                  DIET - DYS 1 Room service appropriate? No; Fluid consistency: Thin  Diet effective now               Precautions / Restrictions Precautions Precautions: Fall Precaution Comments: Left lateral lean, Left hemianopsia, Left inattention, impulsive Restrictions Weight Bearing Restrictions: No   Has the patient had 2 or more falls or a fall with injury in the past year? 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 Independent  Indoor Mobility: Did the patient need assistance with walking from room to room (with or without device)? Independent  Stairs: Did the patient need assistance with internal or external stairs (with or without device)? Independent  Functional Cognition: Did the patient need help planning regular tasks such as shopping or remembering to take medications? Independent  Home Assistive Devices / Equipment Home Assistive Devices/Equipment: None Home Equipment: None  Prior Device Use: Indicate devices/aids used by the patient prior to current illness, exacerbation or injury? None of the above  Current Functional Level Cognition  Overall Cognitive Status: Impaired/Different from baseline Difficult to assess due to: Level of arousal Current Attention Level: Sustained Orientation Level: Oriented to person Following Commands: Follows one step commands inconsistently Safety/Judgement: Decreased awareness of safety, Decreased awareness of deficits General Comments: Pt is perseverative, he self disracts frequently  Attention: Focused, Sustained Focused Attention: Appears intact Sustained Attention: Impaired Sustained Attention Impairment: Verbal basic, Functional basic Memory: Impaired Memory Impairment: Storage deficit, Retrieval deficit, Decreased recall of new information Awareness: Impaired Awareness Impairment: Intellectual impairment    Extremity Assessment (includes Sensation/Coordination)  Upper Extremity Assessment: Generalized weakness, LUE deficits/detail RUE Deficits / Details: AROM appears WFL.  He, however, perseverated on scooping syrup and food onto spoon, and was unable to extinguish behavior  LUE Deficits / Details: chorea present, but he is able to engage Lt UE as an active assist during ADLs  LUE Coordination: decreased fine motor, decreased gross motor  Lower  Extremity Assessment: Defer to PT evaluation LLE Deficits / Details: left leg is functionally weaker than R leg.  Seated MMT was bassically equal with decreased coordination on left, increased foot drag on left.      ADLs  Overall ADL's : Needs assistance/impaired Eating/Feeding: Sitting Eating/Feeding Details (indicate cue type and reason): Pt able to feed self with utensil utilizing R UE but needing  cuing to swallow,unable to locate items to the L >midline of tray. Pt needing suctioning secondary to oral holding of liquids. Grooming: Oral care, Minimal assistance, Standing Grooming Details (indicate cue type and reason): Pt proceeded to brush teeth without toothpaste.  He required mod cues and min A to put toothpaste on toothbrush  Upper Body Bathing: Set up, Sitting Upper Body Bathing Details (indicate cue type and reason): Pt unable to specify that he was nauseous. before vomitting. Lower Body Bathing: Maximal assistance, Sit to/from stand Lower Body Bathing Details (indicate cue type and reason): assist for thoroughness  Upper Body Dressing : Minimal assistance, Bed level, Cueing for sequencing Upper Body Dressing Details (indicate cue type and reason): don/doff gown Lower Body Dressing: Moderate assistance, +2 for safety/equipment Lower Body Dressing Details (indicate cue type and reason): Pt able to don/doff socks with min guard assist  Toilet Transfer: Moderate assistance, Ambulation, Comfort height toilet, Grab bars Toilet Transfer Details (indicate cue type and reason): maxA for hygiene Toileting- Clothing Manipulation and Hygiene: Maximal assistance, Sit to/from stand Toileting - Clothing Manipulation Details (indicate cue type and reason): Pt incontinent of stool, and required max A for clean up and peri care  Functional mobility during ADLs: Moderate assistance General ADL Comments: continues to require consistent 1 step multimodal cues for organization and sequencing. Pt  impulsive and perseverative. Placed BADL items on the L to facilitate tracking    Mobility  Overal bed mobility: Needs Assistance Bed Mobility: Supine to Sit Rolling: Min guard Supine to sit: Min guard Sit to supine: Min guard General bed mobility comments: Pt initially repeatedly pulled self toward Rt bedrails.  He required mod cues to move toward the left side of the bed     Transfers  Overall transfer level: Needs assistance Equipment used: Rolling walker (2 wheeled) Transfers: Sit to/from Stand, [employer] Transfers Sit to Stand: Min assist Stand pivot transfers: Mod assist General transfer comment: Pt requires assist to steady.  He looses his balance to the Lt and is very impulsive     Ambulation / Gait / Stairs / Wheelchair Mobility  Ambulation/Gait Ambulation/Gait assistance: Mod assist, +2 safety/equipment Gait Distance (Feet): 180 Feet Assistive device: Rolling walker (2 wheeled) Gait Pattern/deviations: Narrow base of support, Decreased stride length, Step-through pattern, Drifts right/left General Gait Details: assistance required for balance and guiding RW; cues for navigating hallway and attempted to work on scanning; cues for upright posture and forward gaze Gait velocity: decreased Gait velocity interpretation: <1.31 ft/sec, indicative of household ambulator    Posture / Balance Dynamic Sitting Balance Sitting balance - Comments: maintained balance while changed wet gown and even able to lift each foot and maintain balance (donning brief) Balance Overall balance assessment: Needs assistance Sitting-balance support: Feet supported Sitting balance-[name]: Fair Sitting balance - Comments: maintained balance while changed wet gown and even able to lift each foot and maintain balance (donning brief) Postural control: Posterior lean, Left lateral lean Standing balance support: No upper extremity supported Standing balance-[name]: Poor Standing balance  comment: requires min A for static standing  High Level Balance Comments: Pt able to engage in ball throwing task at both seated, and standing levels - required assist in standing    Special needs/care consideration BiPAP/CPAP : no CPM : no Continuous Drip IV : no Dialysis : no        Days :no Life Vest : no Oxygen : no Special Bed : high-low bed with floor pads Trach Size : no Wound  Vac (area) : no      Location : no Skin: abrasion to right arm, hand, finger; skin tear to right hand, finger.                          Bowel mgmt: incontinence, last BM: 06/01/2019 Bladder mgmt: incontinent Diabetic mgmt: yes Behavioral consideration : restless, requires telesitter Chemo/radiation : no   Previous Home Environment (from acute therapy documentation) Living Arrangements: Spouse/significant other  Lives With: Spouse Available Help at Discharge: Friend(s), Available PRN/intermittently Type of Home: House Home Layout: One level Home Access: Stairs to enter Entrance Stairs-Rails: Right, Left, Can reach both Entrance Stairs-Number of Steps: 2 Home Care Services: No Additional Comments: home info from pt who may not be reliable.  Discharge Living Setting Plans for Discharge Living Setting: Patient's home, Lives with (comment)(wife) Type of Home at Discharge: House Discharge Home Layout: One level Discharge Home Access: Stairs to enter Entrance Stairs-Rails: Right, Left Entrance Stairs-Number of Steps: 3 steps Discharge Bathroom Shower/Tub: Tub/shower unit Discharge Bathroom Toilet: Standard Discharge Bathroom Accessibility: Yes How Accessible: Accessible via walker Does the patient have any problems obtaining your medications?: No  Social/Family/Support Systems Patient Roles: Spouse Contact Information: wife [name]: [phone];  Anticipated Caregiver: wife  Anticipated Caregiver's Contact Information: see above Ability/Limitations of Caregiver: Min A Caregiver Availability:  24/7 Discharge Plan Discussed with Primary Caregiver: Yes Is Caregiver In Agreement with Plan?: Yes Does Caregiver/Family have Issues with Lodging/Transportation while Pt is in Rehab?: No  Designated Visitor: Recruitment consultant (wife)  Goals/Additional Needs Patient/Family Goal for Rehab: PT/OT: Supervision; SLP: Supervision Expected length of stay: 10-14 days Cultural Considerations: NA Dietary Needs: DYS 1, thin liquids Equipment Needs: TBD Special Service Needs: telesitter  Pt/Family Agrees to Admission and willing to participate: Yes Program Orientation Provided & Reviewed with Pt/Caregiver Including Roles  & Responsibilities: Yes  Barriers to Discharge: Home environment access/layout, Incontinence, Insurance for SNF coverage, Other (comments)  Barriers to Discharge Comments: steps to enter; restlessness (need for telesitter), uninsured  Decrease burden of Care through IP rehab admission: NA  Possible need for SNF placement upon discharge: Not anticipated; pt has 24/7 support from his wife who understands that he will require constant supervision after short CIR stay and will return home with her and with support from friend [name].   Patient Condition: I have reviewed medical records from [facility], spoken with CSW, and patient and spouse and close family friend, [name]. I met with patient at the bedside for inpatient rehabilitation assessment.  Patient will benefit from ongoing PT, OT and SLP, can actively participate in 3 hours of therapy a day 5 days of the week, and can make measurable gains during the admission.  Patient will also benefit from the coordinated team approach during an Inpatient Acute Rehabilitation admission.  The patient will receive intensive therapy as well as Rehabilitation physician, nursing, social worker, and care management interventions.  Due to bladder management, bowel management, safety, skin/wound care, disease management, medication  administration, pain management and patient education the patient requires 24 hour a day rehabilitation nursing.  The patient is currently Mod A x2 with mobility and Min A to Mod A for basic ADLs.  Discharge setting and therapy post discharge at home with home health is anticipated.  Patient has agreed to participate in the Acute Inpatient Rehabilitation Program and will admit 06/03/2019.  Preadmission Screen Completed By:  [name], 06/03/2019 10:42 AM ______________________________________________________________________   Discussed status with  Dr. Naaman Plummer on 06/03/2019 at 10:40AM and received approval for admission today.  Admission Coordinator:  [name], OT, time 10:40AM/Date 06/03/2019   Assessment/Plan: Diagnosis: right PCA infarct 1. Does the need for close, 24 hr/day Medical supervision in concert with the patient's rehab needs make it unreasonable for this patient to be served in a less intensive setting? Yes 2. Co-Morbidities requiring supervision/potential complications: HTN, post-stroke sequelae 3. Due to bladder management, bowel management, safety, skin/wound care, disease management, medication administration, pain management and patient education, does the patient require 24 hr/day rehab nursing? Yes 4. Does the patient require coordinated care of a physician, rehab nurse, PT (1-2 hrs/day, 5 days/week), OT (1-2 hrs/day, 5 days/week) and SLP (1-2 hrs/day, 5 days/week) to address physical and functional deficits in the context of the above medical diagnosis(es)? Yes Addressing deficits in the following areas: balance, endurance, locomotion, strength, transferring, bowel/bladder control, bathing, dressing, feeding, grooming, toileting, cognition, speech, language and psychosocial support 5. Can the patient actively participate in an intensive therapy program of at least 3 hrs of therapy 5 days a week? Yes 6. The potential for patient to make measurable gains while on inpatient rehab  is excellent 7. Anticipated functional outcomes upon discharge from inpatients are: supervision PT, supervision OT, supervision SLP 8. Estimated rehab length of stay to reach the above functional goals is: 10-14 days 9. Anticipated D/C setting: Home 10. Anticipated post D/C treatments: [place] therapy 11. Overall Rehab/Functional Prognosis: excellent  MD Signature: Meredith Staggers, MD, [place] [facility] 06/03/2019         Revision History Date/Time User Provider Type Action  06/03/2019 11:10 AM Meredith Staggers, MD Physician Sign  06/03/2019 10:42 AM [name], OT Rehab Admission Coordinator Share  View Details Report

## 2019-06-03 NOTE — Progress Notes (Signed)
Inpatient Rehabilitation  Patient information reviewed and entered into eRehab system by Lorry Furber M. Mellissa Conley, M.A., CCC/SLP, PPS Coordinator.  Information including medical coding, functional ability and quality indicators will be reviewed and updated through discharge.    

## 2019-06-03 NOTE — H&P (Signed)
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  Per chart review patient lives with spouse.  1 level home with 2 steps to entry.  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  Wife plans to assist as needed on discharge.  Presented 03/10/2019 with left side weakness as well as altered mental status with right side gaze and left-sided neglect.  Noted systolic blood pressure in the 180s.  Chemistry unremarkable except glucose elevated 242, COVID negative.  Cranial CT scan negative for acute findings.  Noted extensive chronic small vessel ischemic changes throughout the brain as well as old right frontal infarction.  CT angiogram of head and neck showed fetal type right PCA with proximal occlusion as well as 30 to 40% atheromatous narrowing at the right ICA bulb.  Patient did not receive TPA.  Underwent successful mechanical thrombectomy of right posterior cerebral artery per interventional radiology.  Follow-up MRI showed ischemic infarct of the right posterior cerebral artery territory.  No hemorrhage or mass-effect.  Echocardiogram with ejection fraction of [id number] normal systolic function.  Hospital course noted seizure-like episodes/left hemichorea and EEG completed 04/15/2019 showing no seizure activity.  Neurology follow-up currently maintained on aspirin 81 mg daily as well as Brilinta for CVA prophylaxis.  Subcutaneous Lovenox for DVT prophylaxis.  Speech therapy follow-up for confusion with cognitive decline patient was placed on Aricept as well as scheduled Seroquel for bouts of restlessness and agitation.  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  Dysphasia #1 thin liquid diet.  On 05/28/2019 patient spiked a fever 102.3 with urinalysis negative chest x-ray unremarkable and blood cultures no growth to date.  Urine culture did show staph hemolyticus and enterococcus faecalis and placed on Rocephin x5 days with antibiotic completed.  Therapy evaluation completed and patient was admitted for a comprehensive rehab program  Review of Systems  Constitutional: Negative for chills and fever.  HENT: Negative for hearing loss.   Eyes: Negative for blurred vision and double vision.  Respiratory: Negative for shortness of breath.   Cardiovascular: Negative for chest pain, palpitations and leg swelling.  Gastrointestinal: Positive for constipation. Negative for heartburn, nausea and vomiting.  Genitourinary: Negative for dysuria, flank pain and hematuria.  Musculoskeletal: Positive for myalgias.  Skin: Negative for rash.  Neurological: Positive for tremors, sensory change, speech change and weakness.  All other systems reviewed and are negative.  Past Medical History:  Diagnosis Date   Diabetes mellitus without complication ([place])    Hypertension    Stroke [facility])    Past Surgical History:  Procedure Laterality Date   IR ANGIO INTRA EXTRACRAN SEL COM CAROTID INNOMINATE UNI L MOD SED  03/10/2019   IR ANGIO VERTEBRAL SEL SUBCLAVIAN INNOMINATE BILAT MOD SED  03/10/2019   IR CT HEAD LTD  03/10/2019   IR PERCUTANEOUS ART THROMBECTOMY/INFUSION INTRACRANIAL INC DIAG ANGIO  03/10/2019   RADIOLOGY WITH ANESTHESIA N/A 03/10/2019   Procedure: IR WITH ANESTHESIA/CODE STROKE;  Surgeon: Radiologist,  Medication, MD;  Location: [place];  Service: Radiology;  Laterality: N/A;   Family History  Problem Relation Age of Onset   Diabetes Other     Hypertension Other    Social History:  reports that he has never smoked. He has never used smokeless tobacco. He reports that he does not drink alcohol or use drugs. Allergies: No Known Allergies Medications Prior to Admission  Medication Sig Dispense Refill   aspirin EC 81 MG tablet Take 1 tablet (81 mg total) by mouth daily. 30 tablet 3   clopidogrel (PLAVIX) 75 MG tablet Take 1 tablet (75 mg total) by mouth daily. 21 tablet 0   baclofen (LIORESAL) 10 MG tablet Take 5mg  (1/2 a tablet) twice a day and then 10mg  (a whole tablet at bedtime) (Patient not taking: Reported on 11/13/2018) 60 each 0   metFORMIN (GLUCOPHAGE) 1000 MG tablet Take 1 tablet (1,000 mg total) by mouth 2 (two) times daily with a meal. (Patient not taking: Reported on 11/13/2018) 180 tablet 3    Drug Regimen Review Drug regimen was reviewed and remains appropriate with no significant issues identified  Home: Home Living Family/patient expects to be discharged to:: Private residence Living Arrangements: Spouse/significant other Available Help at Discharge: Friend(s), Available PRN/intermittently Type of Home: House Home Access: Stairs to enter [employer] of Steps: 2 Entrance Stairs-Rails: Right, Left, Can reach both Home Layout: One level Home Equipment: None Additional Comments: home info from pt who may not be reliable.  Lives With: Spouse   Functional History: Prior Function Level of Independence: Independent Comments: Visual merchandiser  Functional Status:  Mobility: Bed Mobility Overal bed mobility: Needs Assistance Bed Mobility: Supine to Sit Rolling: Min guard Supine to sit: Min guard Sit to supine: Min guard General bed mobility comments: Pt initially repeatedly pulled self toward Rt bedrails.  He required mod cues to move toward the left side of the bed  Transfers Overall transfer level: Needs assistance Equipment used: Rolling walker (2 wheeled) Transfers: Sit to/from Stand, [employer]  Transfers Sit to Stand: Min assist Stand pivot transfers: Mod assist General transfer comment: Pt requires assist to steady.  He looses his balance to the Lt and is very impulsive  Ambulation/Gait Ambulation/Gait assistance: Mod assist, +2 safety/equipment Gait Distance (Feet): 180 Feet Assistive device: Rolling walker (2 wheeled) Gait Pattern/deviations: Narrow base of support, Decreased stride length, Step-through pattern, Drifts right/left General Gait Details: assistance required for balance and guiding RW; cues for navigating hallway and attempted to work on scanning; cues for upright posture and forward gaze Gait velocity: decreased Gait velocity interpretation: <1.31 ft/sec, indicative of household ambulator    ADL: ADL Overall ADL's : Needs assistance/impaired Eating/Feeding: Sitting Eating/Feeding Details (indicate cue type and reason): Pt able to feed self with utensil utilizing R UE but needing cuing to swallow,unable to locate items to the L >midline of tray. Pt needing suctioning secondary to oral holding of liquids. Grooming: Oral care, Minimal assistance, Standing Grooming Details (indicate cue type and reason): Pt proceeded to brush teeth without toothpaste.  He required mod cues and min A to put toothpaste on toothbrush  Upper Body Bathing: Set up, Sitting Upper Body Bathing Details (indicate cue type and reason): Pt unable to specify that he was nauseous. before vomitting. Lower Body Bathing: Maximal assistance, Sit to/from stand Lower Body Bathing Details (indicate cue type and reason): assist for thoroughness  Upper Body Dressing : Minimal assistance, Bed level, Cueing for sequencing Upper Body Dressing Details (indicate cue type  and reason): don/doff gown Lower Body Dressing: Moderate assistance, +2 for safety/equipment Lower Body Dressing Details (indicate cue type and reason): Pt able to don/doff socks with min guard assist  Toilet Transfer: Moderate assistance,  Ambulation, Comfort height toilet, Grab bars Toilet Transfer Details (indicate cue type and reason): maxA for hygiene Toileting- Clothing Manipulation and Hygiene: Maximal assistance, Sit to/from stand Toileting - Clothing Manipulation Details (indicate cue type and reason): Pt incontinent of stool, and required max A for clean up and peri care  Functional mobility during ADLs: Moderate assistance General ADL Comments: continues to require consistent 1 step multimodal cues for organization and sequencing. Pt impulsive and perseverative. Placed BADL items on the L to facilitate tracking  Cognition: Cognition Overall Cognitive Status: Impaired/Different from baseline Orientation Level: Oriented to person Attention: Focused, Sustained Focused Attention: Appears intact Sustained Attention: Impaired Sustained Attention Impairment: Verbal basic, Functional basic Memory: Impaired Memory Impairment: Storage deficit, Retrieval deficit, Decreased recall of new information Awareness: Impaired Awareness Impairment: Intellectual impairment Cognition Arousal/Alertness: Awake/alert Behavior During Therapy: Impulsive, Restless Overall Cognitive Status: Impaired/Different from baseline Area of Impairment: Orientation, Attention, Memory, Following commands, Safety/judgement, Awareness, Problem solving Orientation Level: Disoriented to, Time, Situation, Place Current Attention Level: Sustained Memory: Decreased short-term memory Following Commands: Follows one step commands inconsistently Safety/Judgement: Decreased awareness of safety, Decreased awareness of deficits Awareness: Intellectual Problem Solving: Requires verbal cues, Decreased initiation, Requires tactile cues General Comments: Pt is perseverative, he self disracts frequently  Difficult to assess due to: Level of arousal  Physical Exam: Blood pressure 115/68, pulse 80, temperature 98.4 F (36.9 C), temperature source Oral, resp. rate  18, height 5\' 9"  (1.753 m), weight 76 kg, SpO2 98 %. Physical Exam  Constitutional: No distress.  HENT:  Head: Normocephalic and atraumatic.  Eyes: Pupils are equal, round, and reactive to light. EOM are normal.  Neck: Normal range of motion. No thyromegaly present.  Cardiovascular: Normal rate and regular rhythm. Exam reveals no friction rub.  No murmur heard. Respiratory: Effort normal. No respiratory distress. He has no wheezes.  GI: Soft. He exhibits no distension. There is no abdominal tenderness.  Musculoskeletal: Normal range of motion.        General: No edema.  Neurological: A cranial nerve deficit is present.  Alert and oriented to self and hospital. Decreased insight and awareness. Follows simple one step commands. Language intact. Speech muffled. Right gaze preference. Can tend to left with cueing. Left field cut but inconsistent following cues on exam. RUE and RLE moving spontaneously and at least 4/5. LUE 3/5 and ataxic. LLE 2/5 and ataxic/inconsistent activating.      Skin: He is not diaphoretic.  Psychiatric:  Flat, distracted    Results for orders placed or performed during the hospital encounter of 03/10/19 (from the past 48 hour(s))  Glucose, capillary     Status: Abnormal   Collection Time: 06/01/19  6:23 AM  Result Value Ref Range   Glucose-Capillary 117 (H) 70 - 99 mg/dL   Comment 1 Notify RN    Comment 2 Document in Chart   Glucose, capillary     Status: Abnormal   Collection Time: 06/01/19 11:41 AM  Result Value Ref Range   Glucose-Capillary 232 (H) 70 - 99 mg/dL   Comment 1 Notify RN    Comment 2 Document in Chart   Glucose, capillary     Status: Abnormal   Collection Time: 06/01/19  4:24 PM  Result Value Ref Range   Glucose-Capillary 128 (H) 70 - 99  mg/dL   Comment 1 Notify RN    Comment 2 Document in Chart   Glucose, capillary     Status: Abnormal   Collection Time: 06/01/19  9:47 PM  Result Value Ref Range   Glucose-Capillary 101 (H) 70 - 99  mg/dL  Glucose, capillary     Status: None   Collection Time: 06/02/19  5:58 AM  Result Value Ref Range   Glucose-Capillary 74 70 - 99 mg/dL  Glucose, capillary     Status: Abnormal   Collection Time: 06/02/19 11:25 AM  Result Value Ref Range   Glucose-Capillary 158 (H) 70 - 99 mg/dL   Comment 1 Notify RN    Comment 2 Document in Chart   Glucose, capillary     Status: Abnormal   Collection Time: 06/02/19  4:00 PM  Result Value Ref Range   Glucose-Capillary 115 (H) 70 - 99 mg/dL  Glucose, capillary     Status: Abnormal   Collection Time: 06/02/19  9:30 PM  Result Value Ref Range   Glucose-Capillary 107 (H) 70 - 99 mg/dL   No results found.     Medical Problem List and Plan: 1.  Left-sided weakness with right gaze preference and left neglect secondary to right large PCA infarction status post IR revascularization occluded R PCOM and PCA as well as history of CVA in May 2017 and March 2020  -admit to inpatient rehab. 2.  Antithrombotics: -DVT/anticoagulation: Lovenox  -antiplatelet therapy: Aspirin 81 mg daily, Brilinta 90 mg twice daily 3. Pain Management: Tylenol as needed 4. Mood: Aricept 10 mg nightly,   -antipsychotic agents: seroquel 25mg  tid   -try to reduce to limit day time sedation  -start sleep chart 5. Neuropsych: This patient is not capable of making decisions on his own behalf. 6. Skin/Wound Care: routine care, skin checks. Improve nutrition 7. Fluids/Electrolytes/Nutrition: Routine in and outs with follow-up chemistries 8.  Hypertension.  Norvasc 5 mg daily.  Monitor with increased mobility 9.  Diabetes mellitus.  Hemoglobin A1c 9.8.    -continue glucophage 500 mg twice daily.    -cbg's ac and hs  -titrate regimen as needed  -education when appropriate 10.  UTI/enterococcus faecalis.  Completed 5-day course of Rocephin 11.  Hyperlipidemia.  Lipitor 12.  Dysphagia.  Dysphasia #1 thin liquid.  Follow-up speech therapy and advance as tolerated 13.  Left upper  extremity involuntary movement/hemichorea?  EEG negative  -appears to be weakened and ataxic on exam although pt was inconsistent with effort--continue to follow    Cathlyn Parsons, PA-C 06/03/2019

## 2019-06-03 NOTE — H&P (Signed)
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  Per chart review patient lives with spouse.  1 level home with 2 steps to entry.  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  Wife plans to assist as needed on discharge.  Presented 03/10/2019 with left side weakness as well as altered mental status with right side gaze and left-sided neglect.  Noted systolic blood pressure in the 180s.  Chemistry unremarkable except glucose elevated 242, COVID negative.  Cranial CT scan negative for acute findings.  Noted extensive chronic small vessel ischemic changes throughout the brain as well as old right frontal infarction.  CT angiogram of head and neck showed fetal type right PCA with proximal occlusion as well as 30 to 40% atheromatous narrowing at the right ICA bulb.  Patient did not receive TPA.  Underwent successful mechanical thrombectomy of right posterior cerebral artery per interventional radiology.  Follow-up MRI showed ischemic infarct of the right posterior cerebral artery territory.  No hemorrhage or mass-effect.  Echocardiogram with ejection fraction of [id number] normal systolic function.  Hospital course noted seizure-like episodes/left hemichorea and EEG completed 04/15/2019 showing no seizure activity.  Neurology follow-up currently maintained on aspirin 81 mg daily as well as Brilinta for CVA prophylaxis.  Subcutaneous Lovenox for DVT prophylaxis.  Speech therapy follow-up for confusion with cognitive decline patient was placed on Aricept as well as scheduled Seroquel for bouts of restlessness and agitation.  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  Dysphasia #1 thin liquid diet.  On 05/28/2019 patient spiked a fever 102.3 with urinalysis negative chest x-ray unremarkable and blood cultures no growth to date.  Urine culture did show staph hemolyticus and enterococcus faecalis and placed on Rocephin x5 days with antibiotic completed.  Therapy evaluation completed and patient was admitted for a comprehensive rehab program   Review of Systems  Constitutional: Negative for chills and fever.  HENT: Negative for hearing loss.   Eyes: Negative for blurred vision and double vision.  Respiratory: Negative for shortness of breath.   Cardiovascular: Negative for chest pain, palpitations and leg swelling.  Gastrointestinal: Positive for constipation. Negative for heartburn, nausea and vomiting.  Genitourinary: Negative for dysuria, flank pain and hematuria.  Musculoskeletal: Positive for myalgias.  Skin: Negative for rash.  Neurological: Positive for tremors, sensory change, speech change and weakness.  All other systems reviewed and are negative.       Past Medical History:  Diagnosis Date   Diabetes mellitus without complication ([place])     Hypertension     Stroke [facility])          Past Surgical History:  Procedure Laterality Date   IR ANGIO INTRA EXTRACRAN SEL COM CAROTID INNOMINATE UNI L MOD SED   03/10/2019   IR ANGIO VERTEBRAL SEL SUBCLAVIAN INNOMINATE BILAT MOD SED   03/10/2019   IR CT HEAD LTD   03/10/2019   IR PERCUTANEOUS ART THROMBECTOMY/INFUSION  INTRACRANIAL INC DIAG ANGIO   03/10/2019   RADIOLOGY WITH ANESTHESIA N/A 03/10/2019    Procedure: IR WITH ANESTHESIA/CODE STROKE;  Surgeon: Radiologist, Medication, MD;  Location: [place];  Service: Radiology;  Laterality: N/A;        Family History  Problem Relation  Age of Onset   Diabetes Other     Hypertension Other     Social History:  reports that he has never smoked. He has never used smokeless tobacco. He reports that he does not drink alcohol or use drugs. Allergies: No Known Allergies       Medications Prior to Admission  Medication Sig Dispense Refill   aspirin EC 81 MG tablet Take 1 tablet (81 mg total) by mouth daily. 30 tablet 3   clopidogrel (PLAVIX) 75 MG tablet Take 1 tablet (75 mg total) by mouth daily. 21 tablet 0   baclofen (LIORESAL) 10 MG tablet Take 5mg  (1/2 a tablet) twice a day and then 10mg  (a whole tablet at bedtime) (Patient not taking: Reported on 11/13/2018) 60 each 0   metFORMIN (GLUCOPHAGE) 1000 MG tablet Take 1 tablet (1,000 mg total) by mouth 2 (two) times daily with a meal. (Patient not taking: Reported on 11/13/2018) 180 tablet 3     Drug Regimen Review Drug regimen was reviewed and remains appropriate with no significant issues identified   Home: Home Living Family/patient expects to be discharged to:: Private residence Living Arrangements: Spouse/significant other Available Help at Discharge: Friend(s), Available PRN/intermittently Type of Home: House Home Access: Stairs to enter [employer] of Steps: 2 Entrance Stairs-Rails: Right, Left, Can reach both Home Layout: One level Home Equipment: None Additional Comments: home info from pt who may not be reliable.  Lives With: Spouse   Functional History: Prior Function Level of Independence: Independent Comments: Visual merchandiser   Functional Status:  Mobility: Bed Mobility Overal bed mobility: Needs Assistance Bed Mobility: Supine to Sit Rolling: Min guard Supine to sit: Min guard Sit to supine: Min guard General bed mobility comments: Pt initially repeatedly pulled self toward Rt bedrails.  He required mod cues to move toward the left side of the bed  Transfers Overall transfer level: Needs assistance Equipment used: Rolling walker (2  wheeled) Transfers: Sit to/from Stand, [employer] Transfers Sit to Stand: Min assist Stand pivot transfers: Mod assist General transfer comment: Pt requires assist to steady.  He looses his balance to the Lt and is very impulsive  Ambulation/Gait Ambulation/Gait assistance: Mod assist, +2 safety/equipment Gait Distance (Feet): 180 Feet Assistive device: Rolling walker (2 wheeled) Gait Pattern/deviations: Narrow base of support, Decreased stride length, Step-through pattern, Drifts right/left General Gait Details: assistance required for balance and guiding RW; cues for navigating hallway and attempted to work on scanning; cues for upright posture and forward gaze Gait velocity: decreased Gait velocity interpretation: <1.31 ft/sec, indicative of household ambulator     ADL: ADL Overall ADL's : Needs assistance/impaired Eating/Feeding: Sitting Eating/Feeding Details (indicate cue type and reason): Pt able to feed self with utensil utilizing R UE but needing cuing to swallow,unable to locate items to the L >midline of tray. Pt needing suctioning secondary to oral holding of liquids. Grooming: Oral care, Minimal assistance, Standing Grooming Details (indicate cue type and reason): Pt proceeded to brush teeth without toothpaste.  He required mod cues and min A to put toothpaste on toothbrush  Upper Body Bathing: Set up, Sitting Upper Body Bathing Details (indicate cue type and reason): Pt unable to specify that he was  nauseous. before vomitting. Lower Body Bathing: Maximal assistance, Sit to/from stand Lower Body Bathing Details (indicate cue type and reason): assist for thoroughness  Upper Body Dressing : Minimal assistance, Bed level, Cueing for sequencing Upper Body Dressing Details (indicate cue type and reason): don/doff gown Lower Body Dressing: Moderate assistance, +2 for safety/equipment Lower Body Dressing Details (indicate cue type and reason): Pt able to don/doff socks with min  guard assist  Toilet Transfer: Moderate assistance, Ambulation, Comfort height toilet, Grab bars Toilet Transfer Details (indicate cue type and reason): maxA for hygiene Toileting- Clothing Manipulation and Hygiene: Maximal assistance, Sit to/from stand Toileting - Clothing Manipulation Details (indicate cue type and reason): Pt incontinent of stool, and required max A for clean up and peri care  Functional mobility during ADLs: Moderate assistance General ADL Comments: continues to require consistent 1 step multimodal cues for organization and sequencing. Pt impulsive and perseverative. Placed BADL items on the L to facilitate tracking   Cognition: Cognition Overall Cognitive Status: Impaired/Different from baseline Orientation Level: Oriented to person Attention: Focused, Sustained Focused Attention: Appears intact Sustained Attention: Impaired Sustained Attention Impairment: Verbal basic, Functional basic Memory: Impaired Memory Impairment: Storage deficit, Retrieval deficit, Decreased recall of new information Awareness: Impaired Awareness Impairment: Intellectual impairment Cognition Arousal/Alertness: Awake/alert Behavior During Therapy: Impulsive, Restless Overall Cognitive Status: Impaired/Different from baseline Area of Impairment: Orientation, Attention, Memory, Following commands, Safety/judgement, Awareness, Problem solving Orientation Level: Disoriented to, Time, Situation, Place Current Attention Level: Sustained Memory: Decreased short-term memory Following Commands: Follows one step commands inconsistently Safety/Judgement: Decreased awareness of safety, Decreased awareness of deficits Awareness: Intellectual Problem Solving: Requires verbal cues, Decreased initiation, Requires tactile cues General Comments: Pt is perseverative, he self disracts frequently  Difficult to assess due to: Level of arousal   Physical Exam: Blood pressure 115/68, pulse 80, temperature  98.4 F (36.9 C), temperature source Oral, resp. rate 18, height 5\' 9"  (1.753 m), weight 76 kg, SpO2 98 %. Physical Exam  Constitutional: No distress.  HENT:  Head: Normocephalic and atraumatic.  Eyes: Pupils are equal, round, and reactive to light. EOM are normal.  Neck: Normal range of motion. No thyromegaly present.  Cardiovascular: Normal rate and regular rhythm. Exam reveals no friction rub.  No murmur heard. Respiratory: Effort normal. No respiratory distress. He has no wheezes.  GI: Soft. He exhibits no distension. There is no abdominal tenderness.  Musculoskeletal: Normal range of motion.        General: No edema.  Neurological: A cranial nerve deficit is present.  Alert and oriented to self and hospital. Decreased insight and awareness. Follows simple one step commands. Language intact. Speech muffled. Right gaze preference. Can tend to left with cueing. Left field cut but inconsistent following cues on exam. RUE and RLE moving spontaneously and at least 4/5. LUE 3/5 and ataxic. LLE 2/5 and ataxic/inconsistent activating.      Skin: He is not diaphoretic.  Psychiatric:  Flat, distracted      Lab Results Last 48 Hours       Results for orders placed or performed during the hospital encounter of 03/10/19 (from the past 48 hour(s))  Glucose, capillary     Status: Abnormal    Collection Time: 06/01/19  6:23 AM  Result Value Ref Range    Glucose-Capillary 117 (H) 70 - 99 mg/dL    Comment 1 Notify RN      Comment 2 Document in Chart    Glucose, capillary     Status: Abnormal  Collection Time: 06/01/19 11:41 AM  Result Value Ref Range    Glucose-Capillary 232 (H) 70 - 99 mg/dL    Comment 1 Notify RN      Comment 2 Document in Chart    Glucose, capillary     Status: Abnormal    Collection Time: 06/01/19  4:24 PM  Result Value Ref Range    Glucose-Capillary 128 (H) 70 - 99 mg/dL    Comment 1 Notify RN      Comment 2 Document in Chart    Glucose, capillary     Status:  Abnormal    Collection Time: 06/01/19  9:47 PM  Result Value Ref Range    Glucose-Capillary 101 (H) 70 - 99 mg/dL  Glucose, capillary     Status: None    Collection Time: 06/02/19  5:58 AM  Result Value Ref Range    Glucose-Capillary 74 70 - 99 mg/dL  Glucose, capillary     Status: Abnormal    Collection Time: 06/02/19 11:25 AM  Result Value Ref Range    Glucose-Capillary 158 (H) 70 - 99 mg/dL    Comment 1 Notify RN      Comment 2 Document in Chart    Glucose, capillary     Status: Abnormal    Collection Time: 06/02/19  4:00 PM  Result Value Ref Range    Glucose-Capillary 115 (H) 70 - 99 mg/dL  Glucose, capillary     Status: Abnormal    Collection Time: 06/02/19  9:30 PM  Result Value Ref Range    Glucose-Capillary 107 (H) 70 - 99 mg/dL     Imaging Results (Last 48 hours)  No results found.           Medical Problem List and Plan: 1.  Left-sided weakness with right gaze preference and left neglect secondary to right large PCA infarction status post IR revascularization occluded R PCOM and PCA as well as history of CVA in May 2017 and March 2020             -admit to inpatient rehab.  -ELOS 10-14 days, supervision goals 2.  Antithrombotics: -DVT/anticoagulation: Lovenox  -check venous dopplers             -antiplatelet therapy: Aspirin 81 mg daily, Brilinta 90 mg twice daily 3. Pain Management: Tylenol as needed 4. Mood: Aricept 10 mg nightly,              -antipsychotic agents: seroquel 25mg  tid                         -try to reduce to limit day time sedation             -start sleep chart 5. Neuropsych: This patient is not capable of making decisions on his own behalf. 6. Skin/Wound Care: routine care, skin checks. Improve nutrition 7. Fluids/Electrolytes/Nutrition: Routine in and outs with follow-up chemistries 8.  Hypertension.  Norvasc 5 mg daily.  Monitor with increased mobility 9.  Diabetes mellitus.  Hemoglobin A1c 9.8.               -continue glucophage 500  mg twice daily.               -cbg's ac and hs             -titrate regimen as needed             -education when appropriate 10.  UTI/enterococcus faecalis.  Completed  5-day course of Rocephin 11.  Hyperlipidemia.  Lipitor 12.  Dysphagia.  Dysphasia #1 thin liquid.  Follow-up speech therapy and advance as tolerated 13.  Left upper extremity involuntary movement/hemichorea?  EEG negative             -appears to be weakened and ataxic on exam although pt was inconsistent with effort--continue to follow     Cathlyn Parsons, PA-C 06/03/2019  I have personally performed a face to face diagnostic evaluation of this patient and formulated the key components of the plan.  Additionally, I have personally reviewed laboratory data, imaging studies, as well as relevant notes and concur with the physician assistant's documentation above.  Meredith Staggers, MD, Mell[facility] Drown

## 2019-06-03 NOTE — Progress Notes (Signed)
Inpatient Rehabilitation-Admissions Coordinator   I have medical clearance for admit for CIR today. Pt verbalized understanding of the plan. RN, CM/SW aware.   Please call if questions.   [name], OTR/L  Rehab Admissions Coordinator  [phone] 06/03/2019 10:04 AM

## 2019-06-03 NOTE — Progress Notes (Signed)
Patient admitted to IP Rehab during shift. A&O to self and place, patient response to nurse. Patient  resting in bed with eyes closed, call light within reach. No complaints of pain, tele-sitter in place. Adria Devon, LPN

## 2019-06-04 ENCOUNTER — Inpatient Hospital Stay (HOSPITAL_COMMUNITY): Payer: Self-pay | Admitting: Speech Pathology

## 2019-06-04 ENCOUNTER — Other Ambulatory Visit: Payer: Self-pay | Admitting: Cardiology

## 2019-06-04 ENCOUNTER — Inpatient Hospital Stay (HOSPITAL_COMMUNITY): Payer: Self-pay | Admitting: Physical Therapy

## 2019-06-04 ENCOUNTER — Inpatient Hospital Stay (HOSPITAL_COMMUNITY): Payer: Medicare Other

## 2019-06-04 ENCOUNTER — Inpatient Hospital Stay (HOSPITAL_COMMUNITY): Payer: Self-pay | Admitting: Occupational Therapy

## 2019-06-04 DIAGNOSIS — I63219 Cerebral infarction due to unspecified occlusion or stenosis of unspecified vertebral arteries: Secondary | ICD-10-CM

## 2019-06-04 DIAGNOSIS — R609 Edema, unspecified: Secondary | ICD-10-CM

## 2019-06-04 LAB — GLUCOSE, CAPILLARY
Glucose-Capillary: 102 mg/dL — ABNORMAL HIGH (ref 70–99)
Glucose-Capillary: 219 mg/dL — ABNORMAL HIGH (ref 70–99)
Glucose-Capillary: 128 mg/dL — ABNORMAL HIGH (ref 70–99)
Glucose-Capillary: 146 mg/dL — ABNORMAL HIGH (ref 70–99)

## 2019-06-04 LAB — CBC WITH DIFFERENTIAL/PLATELET
Abs Immature Granulocytes: 0.02 10*3/uL (ref 0.00–0.07)
Basophils Absolute: 0 10*3/uL (ref 0.0–0.1)
Basophils Relative: 1 %
Eosinophils Absolute: 0.2 10*3/uL (ref 0.0–0.5)
Eosinophils Relative: 4 %
HCT: 35 % — ABNORMAL LOW (ref 39.0–52.0)
Hemoglobin: 11.6 g/dL — ABNORMAL LOW (ref 13.0–17.0)
Immature Granulocytes: 0 %
Lymphocytes Relative: 33 %
Lymphs Abs: 1.8 10*3/uL (ref 0.7–4.0)
MCH: 30.9 pg (ref 26.0–34.0)
MCHC: 33.1 g/dL (ref 30.0–36.0)
MCV: 93.3 fL (ref 80.0–100.0)
Monocytes Absolute: 0.6 10*3/uL (ref 0.1–1.0)
Monocytes Relative: 12 %
Neutro Abs: 2.7 10*3/uL (ref 1.7–7.7)
Neutrophils Relative %: 50 %
Platelets: 372 10*3/uL (ref 150–400)
RBC: 3.75 MIL/uL — ABNORMAL LOW (ref 4.22–5.81)
RDW: 13.1 % (ref 11.5–15.5)
WBC: 5.3 10*3/uL (ref 4.0–10.5)
nRBC: 0 % (ref 0.0–0.2)

## 2019-06-04 LAB — COMPREHENSIVE METABOLIC PANEL
ALT: 13 U/L (ref 0–44)
AST: 16 U/L (ref 15–41)
Albumin: 3.2 g/dL — ABNORMAL LOW (ref 3.5–5.0)
Alkaline Phosphatase: 58 U/L (ref 38–126)
Anion gap: 14 (ref 5–15)
BUN: 9 mg/dL (ref 8–23)
CO2: 21 mmol/L — ABNORMAL LOW (ref 22–32)
Calcium: 9.1 mg/dL (ref 8.9–10.3)
Chloride: 102 mmol/L (ref 98–111)
Creatinine, Ser: 0.59 mg/dL — ABNORMAL LOW (ref 0.61–1.24)
GFR calc Af Amer: >60 mL/min (ref >60)
GFR calc non Af Amer: >60 mL/min (ref >60)
Glucose, Bld: 94 mg/dL (ref 70–99)
Potassium: 3.8 mmol/L (ref 3.5–5.1)
Sodium: 137 mmol/L (ref 135–145)
Total Bilirubin: 0.6 mg/dL (ref 0.3–1.2)
Total Protein: 6.4 g/dL — ABNORMAL LOW (ref 6.5–8.1)

## 2019-06-04 NOTE — Progress Notes (Signed)
Bilateral lower extremity venous duplex completed. Preliminary results in Chart review CV Proc. [place] Alaze Garverick,RVS 06/04/2019, 12:15 PM

## 2019-06-04 NOTE — Evaluation (Signed)
Physical Therapy Assessment and Plan  Patient Details  Name: Joe Perry MRN: [id number] Date of Birth: 1950-09-25  PT Diagnosis: Ataxic gait, Difficulty walking and Hemiplegia non-dominant Rehab Potential: Good ELOS: 14 days   Today's Date: 06/04/2019 PT Individual Time: 0800-0900 PT Individual Time Calculation (min): 60 min    Problem List:  Patient Active Problem List   Diagnosis Date Noted  . 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 ([place]) 06/03/2019  . 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 Dyslipidemia, goal LDL below 70 03/12/2019  . Dysphagia due to recent stroke 03/12/2019  . Overweight 03/12/2019  . 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 Abnormality of gait   . Spasticity   . Gait disturbance, post-stroke   . Cerebrovascular accident (CVA) due to occlusion of vertebral artery ([place])   . CVA (cerebral infarction) 01/31/2016  . Type 2 diabetes mellitus without complication, without long-term current use of insulin ([place]) 01/31/2016  . Essential hypertension 01/31/2016  . History of CVA (cerebrovascular accident) 01/31/2016  . 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    Past Medical History:  Past Medical History:  Diagnosis Date  . Diabetes mellitus without complication ([place])   . Hypertension   . Stroke [facility])    Past Surgical History:  Past Surgical History:  Procedure Laterality Date  . IR ANGIO INTRA EXTRACRAN SEL COM CAROTID INNOMINATE UNI L MOD SED  03/10/2019  . IR ANGIO VERTEBRAL SEL SUBCLAVIAN INNOMINATE BILAT MOD SED  03/10/2019  . IR CT HEAD LTD  03/10/2019  . IR PERCUTANEOUS ART THROMBECTOMY/INFUSION INTRACRANIAL INC DIAG ANGIO  03/10/2019  . RADIOLOGY WITH ANESTHESIA N/A 03/10/2019   Procedure: IR WITH ANESTHESIA/CODE STROKE;  Surgeon:  Radiologist, Medication, MD;  Location: [facility];  Service: Radiology;  Laterality: N/A;    Assessment & Plan Clinical Impression: Joe Perry a 68 year old male with 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Pt presented 03/10/2019 with left side weakness as well as altered mental status with right side gaze and left-sided neglect. Noted systolic blood pressure in the 180s. Chemistry unremarkable except glucose elevated 242, COVID negative. Cranial CT scan negative for acute findings. Noted extensive chronic small vessel ischemic changes throughout the brain as well as old right frontal infarction. CT angiogram of head and neck showed fetal type right PCA with proximal occlusion as well as 30 to 40% atheromatousnarrowing at the right ICA bulb. Patient did not receive TPA. Underwent successful mechanical thrombectomy of right posterior cerebral artery per interventional radiology. Follow-up MRI showed ischemic infarct of the right posterior cerebral artery territory. No hemorrhage or mass-effect. Echocardiogram with ejection fraction of 29% normal systolic function. Hospital course noted seizure-like episodes/lefthemichoreaand EEG completed 04/15/2019 showing no seizure activity. Neurology follow-up currently maintained on aspirin 81 mg daily as well as Brilinta for CVA prophylaxis. Subcutaneous Lovenox for DVT prophylaxis. Speech therapy follow-up for confusion with cognitive decline patient was placed on Aricept as well as scheduled Seroquel for bouts of restlessness and agitation. 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 Dysphasia #1  thin  liquid diet. On 05/28/2019 patient spiked a fever 102.3 with urinalysis negative chest x-ray unremarkable and blood cultures no growth to date. Urine culture did show staph hemolyticusand enterococcus faecalisand placed on Rocephin x5 days with antibiotic completed.  Pt admitted for CIR on 06/03/2019.                             Patient currently requires mod with mobility secondary to muscle weakness, muscle joint tightness and muscle paralysis, decreased cardiorespiratoy endurance, impaired timing and sequencing, unbalanced muscle activation, motor apraxia, ataxia, decreased coordination and decreased motor planning, decreased visual acuity, decreased visual perceptual skills and decreased visual motor skills and decreased sitting balance, decreased standing balance, decreased postural control, hemiplegia and decreased balance strategies.  Prior to hospitalization, patient was independent  with mobility and lived with Spouse in a House home.  Home access is 3Stairs to enter.  Patient will benefit from skilled PT intervention to maximize safe functional mobility, minimize fall risk and decrease caregiver burden for planned discharge home with 24 hour assist.  Anticipate patient will benefit from follow up [facility] at discharge.  PT - End of Session Activity Tolerance: Tolerates < 10 min activity, no significant change in vital signs Endurance Deficit: Yes PT Assessment Rehab Potential (ACUTE/IP ONLY): Good PT Barriers to Discharge: Inaccessible home environment;Medical stability;Decreased caregiver support;Home environment access/layout PT Patient demonstrates impairments in the following area(s): Balance;Perception;Safety;Endurance;Motor;Sensory;Pain PT Transfers Functional Problem(s): Bed Mobility;Bed to Chair;Car;Furniture;Floor PT Locomotion Functional Problem(s): Ambulation;Wheelchair Mobility;Stairs PT Plan PT Intensity: Minimum of 1-2 x/day ,45 to 90 minutes PT Frequency: 5 out of 7 days PT  Duration Estimated Length of Stay: 14 days PT Treatment/Interventions: Ambulation/gait training;Discharge planning;DME/adaptive equipment instruction;Functional mobility training;Pain management;Therapeutic Activities;Balance/vestibular training;Community reintegration;Disease management/prevention;Neuromuscular re-education;Patient/family education;Stair training;Therapeutic Exercise;UE/LE Strength taining/ROM;UE/LE Coordination activities;Wheelchair propulsion/positioning;Visual/perceptual remediation/compensation PT Transfers Anticipated Outcome(s): CGA PT Locomotion Anticipated Outcome(s): CGA-minA PT Recommendation Recommendations for Other Services: Therapeutic Recreation consult Therapeutic Recreation Interventions: Kitchen group Follow Up Recommendations: Home health PT Patient destination: Home Equipment Recommended: To be determined  Skilled Therapeutic Intervention SPT performed evaluation and initiated treatment intervention. Pt impulsive upon arrival for therapy but agreeable to eval and treatment. See below for details. Pt demonstrated L sided neglect. Pt instructed on car transfer and required minA and RW. Pt required verbal cueing for task and safety. Pt transported back to room in wheelchair, left sitting up in chair, safety belt in place and call bell within reach. All needs met at this time. NT coming in room as therapy leaving to help pt with breakfast.   PT Evaluation Precautions/Restrictions Precautions Precautions: Fall Precaution Comments: Left lateral lean, Left hemianopsia, impulsive Restrictions Weight Bearing Restrictions: No  Pain Denies pain  Pain Assessment Pain Scale: Faces Faces Pain Scale: No hurt Home Living/Prior Functioning Home Living Available Help at Discharge: Family;Available 24 hours/day Type of Home: House Home Access: Stairs to enter [employer] of Steps: 3 Entrance Stairs-Rails: Right;Left;Can reach both Home Layout: One  level  Lives With: Spouse Prior Function Level of Independence: Independent with basic ADLs Comments: Visual merchandiser Vision/Perception  Vision - Assessment Eye Alignment: Within Functional Limits Ocular Range of Motion: Restricted on the left Tracking/Visual Pursuits: Left eye does not track laterally Additional Comments: Cuing to scan to the   L Perception Perception: Impaired Inattention/Neglect: Does not attend to left side of body  Cognition Overall Cognitive Status: Impaired/Different from baseline Arousal/Alertness: Awake/alert Orientation Level: Oriented to person;Oriented to place Attention: Sustained Sustained  Attention: Impaired Sustained Attention Impairment: Verbal basic;Functional basic Awareness: Impaired Awareness Impairment: Intellectual impairment Problem Solving Impairment: Verbal basic;Functional basic Safety/Judgment: Impaired Sensation Sensation Proprioception: Impaired by gross assessment Coordination Gross Motor Movements are Fluid and Coordinated: No Fine Motor Movements are Fluid and Coordinated: No Coordination and Movement Description: apparent proprioceptive and coordination deficit in both UE and LE Motor  Motor Motor: Ataxia;Hemiplegia  Mobility Bed Mobility Bed Mobility: Rolling Right;Rolling Left;Supine to Sit;Sit to Supine Rolling Right: Minimal Assistance - Patient > 75% Rolling Left: Minimal Assistance - Patient > 75% Supine to Sit: Minimal Assistance - Patient > 75% Sit to Supine: Minimal Assistance - Patient > 75% Transfers Transfers: Sit to Stand;Stand to Sit;Stand Pivot Transfers Sit to Stand: Moderate Assistance - Patient 50-74% Stand to Sit: Moderate Assistance - Patient 50-74% Stand Pivot Transfers: Moderate Assistance - Patient 50 - 74% Stand Pivot Transfer Details: Verbal cues for technique;Verbal cues for gait pattern;Verbal cues for precautions/safety;Verbal cues for safe use of DME/AE Stand Pivot Transfer Details (indicate cue  type and reason): modA for safety Transfer (Assistive device): Rolling walker Locomotion  Gait Ambulation: Yes Gait Assistance: Moderate Assistance - Patient 50-74% Gait Distance (Feet): 75 Feet Assistive device: Rolling walker Gait Assistance Details: Tactile cues for weight shifting;Tactile cues for sequencing;Tactile cues for posture;Tactile cues for placement;Verbal cues for sequencing;Verbal cues for precautions/safety;Verbal cues for technique Gait Assistance Details: modA Gait Gait: Yes Gait Pattern: Decreased step length - left;Decreased step length - right;Ataxic Gait velocity: decreased Stairs / Additional Locomotion Stairs: Yes Stairs Assistance: Moderate Assistance - Patient 50 - 74% Stair Management Technique: Two rails Number of Stairs: 4 Height of Stairs: 4 Wheelchair Mobility Wheelchair Mobility: Yes Wheelchair Assistance: Moderate Assistance - Patient 50 - 74% Wheelchair Propulsion: Both upper extremities Wheelchair Parts Management: Needs assistance Distance: 75  Trunk/Postural Assessment  Cervical Assessment Cervical Assessment: Within Functional Limits Thoracic Assessment Thoracic Assessment: Within Functional Limits Lumbar Assessment Lumbar Assessment: Within Functional Limits Postural Control Postural Control: Within Functional Limits  Balance Balance Balance Assessed: Yes Static Sitting Balance Static Sitting - Balance Support: Bilateral upper extremity supported Static Sitting - Level of Assistance: 5: Stand by assistance Static Standing Balance Static Standing - Balance Support: Bilateral upper extremity supported Static Standing - Level of Assistance: 3: Mod assist Extremity Assessment      RLE Assessment RLE Assessment: Within Functional Limits General Strength Comments: grossly 4+/5 LLE Assessment LLE Assessment: Within Functional Limits General Strength Comments: grossly 4-/5    Refer to Care Plan for Long Term  Goals  Recommendations for other services: Therapeutic Recreation  Kitchen group  Discharge Criteria: Patient will be discharged from PT if patient refuses treatment 3 consecutive times without medical reason, if treatment goals not met, if there is a change in medical status, if patient makes no progress towards goals or if patient is discharged from hospital.  The above assessment, treatment plan, treatment alternatives and goals were discussed and mutually agreed upon: by patient  Olena Leatherwood, SPT  06/04/2019, 2:44 PM

## 2019-06-04 NOTE — Progress Notes (Signed)
30 day event monitor order placed.

## 2019-06-04 NOTE — Evaluation (Signed)
Speech Language Pathology Assessment and Plan  Patient Details  Name: Joe Perry MRN: [id number] Date of Birth: Feb 10, 1951  SLP Diagnosis: Aphasia;Cognitive Impairments;Speech and Language deficits;Dysphagia  Rehab Potential: Fair ELOS: 2 to 2.5 weeks    Today's Date: 06/04/2019 SLP Individual Time: 0930-1030 SLP Individual Time Calculation (min): 60 min   Problem List:  Patient Active Problem List   Diagnosis Date Noted  . 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 ([place]) 06/03/2019  . 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 Dyslipidemia, goal LDL below 70 03/12/2019  . Dysphagia due to recent stroke 03/12/2019  . Overweight 03/12/2019  . 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 Abnormality of gait   . Spasticity   . Gait disturbance, post-stroke   . Cerebrovascular accident (CVA) due to occlusion of vertebral artery ([place])   . CVA (cerebral infarction) 01/31/2016  . Type 2 diabetes mellitus without complication, without long-term current use of insulin ([place]) 01/31/2016  . Essential hypertension 01/31/2016  . History of CVA (cerebrovascular accident) 01/31/2016  . 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 Diabetes mellitus without complication ([place])   . Hypertension   . Stroke [facility])    Past Surgical History:  Past Surgical History:  Procedure Laterality Date  . IR ANGIO INTRA EXTRACRAN SEL COM CAROTID INNOMINATE UNI L MOD SED  03/10/2019  . IR ANGIO VERTEBRAL SEL SUBCLAVIAN INNOMINATE BILAT MOD SED  03/10/2019  . IR CT HEAD LTD  03/10/2019  . IR PERCUTANEOUS ART THROMBECTOMY/INFUSION INTRACRANIAL INC DIAG ANGIO  03/10/2019  . RADIOLOGY WITH ANESTHESIA N/A 03/10/2019   Procedure: IR WITH ANESTHESIA/CODE  STROKE;  Surgeon: Radiologist, Medication, MD;  Location: [place];  Service: Radiology;  Laterality: N/A;    Assessment / Plan / Recommendation Clinical Impression   [name] a 68 year old right-handed male with 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 Per chart review patient lives with spouse and plans to help at discharge. Presented 03/10/2019 with left side weakness as well as altered mental status with right side gaze and left-sided neglect. Noted systolic blood pressure in the 180s and glucose elevated 242. Cranial CT scan negative for acute findings. Noted extensive chronic small vessel ischemic changes throughout the brain as well as old right frontal infarction. CT angiogram of head and neck showed fetal type right PCA with proximal occlusion as well as 30 to 40% atheromatousnarrowing at the right ICA bulb. Patient did not receive TPA. Underwent successful mechanical thrombectomy of right posterior cerebral artery per interventional radiology. Follow-up MRI showed ischemic infarct of the right posterior cerebral artery territory. No hemorrhage or mass-effect. Echocardiogram with ejection fraction of 98% normal systolic function. Hospital course noted seizure-like episodes/lefthemichorea and speech therapy follow-up for confusion with cognitive decline patient was placed on Aricept as well as scheduled Seroquel for bouts of restlessness and agitation. 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 Dysphasia #1 thin liquid diet. On 05/28/2019 patient spiked a fever 102.3 with urinalysis  negative chest x-ray unremarkable and blood cultures  no growth to date. Urine culture did show staph hemolyticusand enterococcus faecalisand placed on Rocephin x5 days with antibiotic completed. Therapy evaluation completed and patient was admitted for a comprehensive rehab program on 06/03/19.   Bedside swallow evaluation completed on 06/04/19. Pt with increased oral clearing when consuming soft solid and solid trials. He also consumed thin liquids via straw and cup without overt s/s of aspiration. Although pt consumed advanced trials without issue during this evaluation, would recommend further skilled observation of advanced trial during meal and across several trials. Education provided to pt's nurse and posted within room. Would continue to recommend full nursing supervision during meals d/t cognitive linguistic deficits.   Cognitive linguistic evaluation also provided this date. Pt continues to present moderate to severe cognitive linguistic deficits c/b deficits in sustained attention, left field attention, task initiation, basic problem solving, fluctuating accuracy with answers to yes/no questions and fluctuating expressive abilities. Pt does demonstrate ability to follow 1 step directions during familiar activities. During evaluation, pt with decreased initiation of communication. When verbalizing, he produces imitative responses and varying responses to the same questions. His receptive abilities appear better than expressive abilities. Speech intelligibility is also reduced during expressive attemtps d/t decreased vocal intensity. Skilled ST continues to required to target the above mentioned deficits. Prognosis is guarded given severity of deficits, time post acute CVA and lack of recent progress across hospitalization. Anticipate that pt will require Mod A to Min A support at discharge with follow up [place] services.     Skilled Therapeutic Interventions          Skilled intervention included facilitation of standing pt to allow nursing to thread  pt's brief. Education provided to pt's nurse on recommendation for pt to remain on dysphagia 1 with thin liquids and full nursing supervision.    SLP Assessment  Patient will need skilled Speech Lanaguage Pathology Services during CIR admission    Recommendations  SLP Diet Recommendations: Dysphagia 1 (Puree);Thin Liquid Administration via: Cup;Straw Medication Administration: Crushed with puree Supervision: Staff to assist with self feeding;Full supervision/cueing for compensatory strategies Compensations: Slow rate;Small sips/bites;Minimize environmental distractions;Lingual sweep for clearance of pocketing Postural Changes and/or Swallow Maneuvers: Seated upright 90 degrees;Upright 30-60 min after meal Oral Care Recommendations: Oral care BID Recommendations for Other Services: Neuropsych consult Patient destination: (TBD - unknown if wife can provide level of care) Follow up Recommendations: Other (comment)(TBD) Equipment Recommended: None recommended by SLP    SLP Frequency 3 to 5 out of 7 days   SLP Duration  SLP Intensity  SLP Treatment/Interventions 2 to 2.5 weeks  Minumum of 1-2 x/day, 30 to 90 minutes  Cognitive remediation/compensation;Cueing hierarchy;Multimodal communication approach;Speech/Language facilitation;Functional tasks;Patient/family education;Dysphagia/aspiration precaution training    Pain    Prior Functioning Cognitive/Linguistic Baseline: Information not available Type of Home: House  Lives With: Spouse Education: unknown Vocation: Other (Comment)(unknown)  Short Term Goals: Week 1: SLP Short Term Goal 1 (Week 1): Pt will utlize multi-modal communication to indicate wants and needs with Mod A cues. SLP Short Term Goal 2 (Week 1): Pt will follow 2 step directions related to basic ADL in 7 out of 10 opportunities with Mod A cues. SLP Short Term Goal 3 (Week 1): Pt will complete familiar problem solving tasks related to ADLs with Mod A cues. SLP  Short Term Goal 4 (Week 1): Pt will consume trials of dysphagia 2 diet with no overt s/s of aspiration/dysphagia over 3 sessions prior to upgraded  with Min A cues for compensatory swallow strategies.  Refer to Care Plan for Long Term Goals  Recommendations for other services: Neuropsych  Discharge Criteria: Patient will be discharged from SLP if patient refuses treatment 3 consecutive times without medical reason, if treatment goals not met, if there is a change in medical status, if patient makes no progress towards goals or if patient is discharged from hospital.  The above assessment, treatment plan, treatment alternatives and goals were discussed and mutually agreed upon: No family available/patient unable  Takeya Marquis 06/04/2019, 10:53 AM

## 2019-06-04 NOTE — IPOC Note (Addendum)
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 ([place])     Functional Problem List: Nursing Bladder, Bowel, Endurance, Medication Management, Safety  PT Balance, Perception, Safety, Endurance, Motor, Sensory, Pain  OT Behavior, Balance, Cognition, Endurance, Motor, Perception, Safety  SLP Cognition, Perception, Safety  TR         Basic ADL's: OT Grooming, Bathing, Dressing, Toileting     Advanced  ADL's: OT       Transfers: PT Bed Mobility, Bed to Chair, Car, Furniture, Floor  OT Toilet, Tub/Shower     Locomotion: PT Ambulation, Emergency planning/management officer, Stairs     Additional Impairments: OT Fuctional Use of Upper Extremity  SLP Swallowing, Communication, Social Cognition comprehension, expression Social Interaction, Problem Solving, Attention, Awareness  TR      Anticipated Outcomes Item Anticipated Outcome  Self Feeding n/a  Swallowing  Supervision   Basic self-care  S - min A  Toileting  min A   Bathroom Transfers min A  Bowel/Bladder  manage bladder and bowel with min assist  Transfers  CGA  Locomotion  CGA-minA  Communication  Min A for basic wants/needs  Cognition  Min A for basic cognition  Pain  n/a  Safety/Judgment  manage safety wtih min assist   Therapy Plan: PT Intensity: Minimum of 1-2 x/day ,45 to 90 minutes PT Frequency: 5 out of 7 days PT Duration Estimated Length of Stay: 14 days OT Intensity: Minimum of 1-2 x/day, 45 to 90 minutes OT Frequency: 5 out of 7 days OT Duration/Estimated Length of Stay: 2 weeks SLP Intensity: Minumum of 1-2 x/day, 30 to 90 minutes SLP Frequency: 3 to 5 out of 7 days SLP Duration/Estimated Length of Stay: 10/16    Due to the current state of emergency, patients may not be receiving their 3-hours of Medicare-mandated therapy.   Team Interventions: Nursing Interventions Patient/Family Education, Bowel Management, Bladder Management, Disease Management/Prevention, Medication Management, Discharge Planning  PT interventions Ambulation/gait training, Discharge planning, DME/adaptive equipment instruction, Functional mobility training, Pain management, Therapeutic Activities, Balance/vestibular training, Community reintegration, Disease management/prevention, Neuromuscular re-education, Patient/family education, Stair training, Therapeutic Exercise, UE/LE Strength taining/ROM, UE/LE Coordination activities, Wheelchair propulsion/positioning, Visual/perceptual remediation/compensation  OT Interventions Training and development officer, Cognitive remediation/compensation, Functional mobility training, Community reintegration, Discharge planning, Pain management, Visual/perceptual remediation/compensation, UE/LE Coordination activities, Neuromuscular re-education, UE/LE Strength taining/ROM, Self Care/advanced ADL retraining, Functional electrical stimulation, Psychosocial support, Therapeutic Exercise, DME/adaptive equipment instruction, Patient/family education, Therapeutic Activities, Wheelchair propulsion/positioning  SLP Interventions Cognitive remediation/compensation, Cueing hierarchy, Multimodal communication approach, Speech/Language facilitation, Functional tasks, Patient/family education, Dysphagia/aspiration precaution training  TR Interventions    SW/CM Interventions Discharge Planning, Psychosocial Support, Patient/Family Education   Barriers to Discharge MD  Medical stability and Medication compliance  Nursing      PT Inaccessible home environment, Medical stability, Decreased caregiver support, Home environment access/layout    OT Decreased caregiver support, Behavior    SLP      SW       Team  Discharge Planning: Destination: PT-Home ,OT- Home , SLP-(TBD - unknown if wife can provide level of care) Projected Follow-up: PT-Home health PT, OT-  24 hour supervision/assistance, Home health OT, SLP-Other (comment)(TBD) Projected Equipment Needs: PT-To be determined, OT- To be determined, SLP-None recommended by SLP Equipment Details: PT- , OT-  Patient/family involved  in discharge planning: PT- Patient,  OT-Patient, SLP-Patient unable/family or caregive not available  MD ELOS: 10-14d Medical Rehab Prognosis:  Good Assessment:  Joe Perry with 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 Per chart review patient lives with spouse. 1 level home with 2 steps to entry. 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 Wife plansto assist as needed on discharge. Presented 03/10/2019 with left side weakness as well as altered mental status with right side gaze and left-sided neglect. Noted systolic blood pressure in the 180s. Chemistry unremarkable except glucose elevated 242, COVID negative. Cranial CT scan negative for acute findings. Noted extensive chronic small vessel ischemic changes throughout the brain as well as old right frontal infarction. CT angiogram of head and neck showed fetal type right PCA with proximal occlusion as well as 30 to 40% atheromatousnarrowing at the right ICA bulb. Patient did not receive TPA. Underwent successful mechanical thrombectomy of right posterior cerebral artery per interventional radiology. Follow-up MRI showed ischemic infarct of the right posterior cerebral artery territory. No hemorrhage or mass-effect. Echocardiogram with ejection fraction of [id number] normal systolic function. Hospital course noted seizure-like episodes/lefthemichoreaand EEG completed 04/15/2019 showing no seizure activity. Neurology  follow-up currently maintained on aspirin 81 mg daily as well as Brilinta for CVA prophylaxis. Subcutaneous Lovenox for DVT prophylaxis. Speech therapy follow-up for confusion with cognitive decline patient was placed on Aricept as well as scheduled Seroquel for bouts of restlessness and agitation. 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 Dysphasia #1 thin liquid diet. On 05/28/2019 patient spiked a fever 102.3 with urinalysis negative chest x-ray unremarkable and blood cultures no growth to date. Urine culture did show staph hemolyticusand enterococcus faecalisand placed on Rocephin x5 days with antibiotic completed.    Now requiring 24/7 Rehab RN,MD, as well as CIR level PT, OT and SLP.  Treatment team will focus on ADLs and mobility with goals set at [place] See Team Conference Notes for weekly updates to the plan of care

## 2019-06-04 NOTE — Progress Notes (Signed)
[facility] PHYSICAL MEDICINE & REHABILITATION PROGRESS NOTE   Subjective/Complaints:  No issues overnight.  Getting a screening Doppler.  Denies any chest pains or leg pains.  Ultrasound technologist notes some swelling of the right calf  Review of systems negative for chest pain shortness of breath nausea vomiting diarrhea constipation   Objective:   No results found. Recent Labs    06/03/19 1555 06/04/19 0541  WBC 5.4 5.3  HGB 11.3* 11.6*  HCT 33.0* 35.0*  PLT 347 372   Recent Labs    06/03/19 1555 06/04/19 0541  NA  --  137  K  --  3.8  CL  --  102  CO2  --  21*  GLUCOSE  --  94  BUN  --  9  CREATININE 0.65 0.59*  CALCIUM  --  9.1    Intake/Output Summary (Last 24 hours) at 06/04/2019 0717 Last data filed at 06/03/2019 1906 Gross per 24 hour  Intake 240 ml  Output 600 ml  Net -360 ml     Physical Exam: Vital Signs Blood pressure 128/73, pulse 79, temperature 98 F (36.7 C), temperature source Oral, resp. rate 16, SpO2 99 %.    General: No acute distress Mood and affect are appropriate Heart: Regular rate and rhythm no rubs murmurs or extra sounds Lungs: Clear to auscultation, breathing unlabored, no rales or wheezes Abdomen: Positive bowel sounds, soft nontender to palpation, nondistended Extremities: No clubbing, cyanosis, or edema Skin: No evidence of breakdown, no evidence of rash Neurologic: Cranial nerves II through XII intact, motor strength is 5/5 in Right an d4/5 Left   deltoid, bicep, tricep, grip, hip flexor, knee extensors, ankle dorsiflexor and plantar flexor Sensory exam normal sensation to light touch and proprioception in bilateral upper and lower extremities Cerebellar exam normal finger to nose to finger as well as heel to shin in bilateral upper and lower extremities Musculoskeletal: Full range of motion in all 4 extremities. No joint swelling  Assessment/Plan: 1. Functional deficits secondary to RIght PCA infarct with left  hemiataxia which require 3+ hours per day of interdisciplinary therapy in a comprehensive inpatient rehab setting.  Physiatrist is providing close team supervision and 24 hour management of active medical problems listed below.  Physiatrist and rehab team continue to assess barriers to discharge/monitor patient progress toward functional and medical goals  Care Tool:  Bathing              Bathing assist       Upper Body Dressing/Undressing Upper body dressing        Upper body assist      Lower Body Dressing/Undressing Lower body dressing            Lower body assist       Toileting Toileting    Toileting assist Assist for toileting: Total Assistance - Patient < 25%     Transfers Chair/bed transfer  Transfers assist           Locomotion Ambulation   Ambulation assist              Walk 10 feet activity   Assist           Walk 50 feet activity   Assist           Walk 150 feet activity   Assist           Walk 10 feet on uneven surface  activity   Assist  Wheelchair     Assist               Wheelchair 50 feet with 2 turns activity    Assist            Wheelchair 150 feet activity     Assist          Blood pressure 128/73, pulse 79, temperature 98 F (36.7 C), temperature source Oral, resp. rate 16, SpO2 99 %.  Medical Problem List and Plan: 1.Left-sided weakness with right gaze preference and left neglectsecondary to right large PCA infarction status post IR revascularization occludedR PCOM and PCAas well as history of CVA in May 2017 and March 2020 -admit to inpatient rehab.PT, OT, SLP evals             -ELOS 10-14 days, supervision goals 2. Antithrombotics: -DVT/anticoagulation:Lovenox             -check venous dopplers today -antiplatelet therapy: Aspirin 81 mg daily, Brilinta 90 mg twice daily 3. Pain Management:Tylenol as needed 4.  Mood:Aricept 10 mg nightly, -antipsychotic agents: seroquel 25mg  tid -try to reduce to limit day time sedation -start sleep chart 5. Neuropsych: This patientis notcapable of making decisions on hisown behalf. 6. Skin/Wound Care:routine care, skin checks. Improve nutrition 7. Fluids/Electrolytes/Nutrition:Routine in and outs with follow-up chemistries 8. Hypertension. Norvasc 5 mg daily. Monitor with increased mobility 9. Diabetes mellitus. Hemoglobin A1c 9.8.  -continue glucophage 500 mg twice daily. -cbg's ac and hs -titrate regimen as needed -education when appropriate 10. UTI/enterococcusfaecalis. Completed 5-day course of Rocephin 11. Hyperlipidemia. Lipitor 12. Dysphagia. Dysphasia #1 thin liquid. Follow-up speech therapy and advance as tolerated 13. Left upper extremity involuntary movement/hemichorea?EEG negative -appears to be weakened and ataxic on exam although pt was inconsistent with effort--continue to follow    LOS: 1 days A FACE TO [place] E Taylinn Brabant 06/04/2019, 7:17 AM

## 2019-06-04 NOTE — Progress Notes (Signed)
Received call from pt's wife [name] this morning requesting that all updates on the patient's condition go through her and not through the family friend [name]. She is requesting no one discuss the patient's condition with [name].   SW aware.   [name], OTR/L  Rehab Admissions Coordinator  [phone] 06/04/2019 10:04 AM

## 2019-06-04 NOTE — Plan of Care (Signed)
LTGs established °

## 2019-06-04 NOTE — Evaluation (Signed)
Occupational Therapy Assessment and Plan  Patient Details  Name: Joe Perry MRN: [id number] Date of Birth: 05-31-1951  OT Diagnosis: abnormal posture, ataxia, cognitive deficits, disturbance of vision, hemiplegia affecting non-dominant side and muscle weakness (generalized), coordination disorder Rehab Potential: Rehab Potential (ACUTE ONLY): Good ELOS: 2 weeks   Today's Date: 06/04/2019 OT Individual Time: 1300-1411 OT Individual Time Calculation (min): 71 min     Problem List:  Patient Active Problem List   Diagnosis Date Noted  . 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 ([place]) 06/03/2019  . 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 Dyslipidemia, goal LDL below 70 03/12/2019  . Dysphagia due to recent stroke 03/12/2019  . Overweight 03/12/2019  . 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 Abnormality of gait   . Spasticity   . Gait disturbance, post-stroke   . Cerebrovascular accident (CVA) due to occlusion of vertebral artery (Joe Perry)   . CVA (cerebral infarction) 01/31/2016  . Type 2 diabetes mellitus without complication, without long-term current use of insulin (Joe Perry) 01/31/2016  . Essential hypertension 01/31/2016  . History of CVA (cerebrovascular accident) 01/31/2016  . 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    Past Medical History:  Past Medical History:  Diagnosis Date  . Diabetes mellitus without complication (Joe Perry)   . Hypertension   . Stroke [facility])    Past Surgical History:  Past Surgical History:  Procedure Laterality Date  . IR ANGIO INTRA EXTRACRAN SEL COM CAROTID INNOMINATE UNI L MOD SED  03/10/2019  . IR ANGIO VERTEBRAL SEL SUBCLAVIAN INNOMINATE BILAT MOD SED  03/10/2019  . IR CT HEAD LTD  03/10/2019  . IR PERCUTANEOUS ART THROMBECTOMY/INFUSION  INTRACRANIAL INC DIAG ANGIO  03/10/2019  . RADIOLOGY WITH ANESTHESIA N/A 03/10/2019   Procedure: IR WITH ANESTHESIA/CODE STROKE;  Surgeon: Radiologist, Medication, MD;  Location: [facility];  Service: Radiology;  Laterality: N/A;    Assessment & Plan Clinical Impression: Patient is a 68 y.o. year old male with 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 Per chart review patient lives with spouse. 1 level home with 2 steps to entry. 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 Wife plansto assist as needed on discharge. Presented 03/10/2019 with left side weakness as well as altered mental status with right side gaze and left-sided neglect. Noted systolic blood pressure in the 180s. Chemistry unremarkable except glucose elevated 242, COVID negative. Cranial CT scan negative for acute findings. Noted extensive chronic small vessel ischemic changes throughout the brain as well as old right frontal infarction. CT angiogram of head and neck showed fetal type right PCA with proximal occlusion as well as 30 to 40% atheromatousnarrowing at the right ICA bulb. Patient did not receive TPA. Underwent successful mechanical thrombectomy of right posterior cerebral artery per interventional radiology. Follow-up MRI showed ischemic infarct of the right posterior cerebral artery territory. No hemorrhage or mass-effect. Echocardiogram with ejection fraction of 41% normal systolic function. Hospital course noted seizure-like episodes/lefthemichoreaand EEG completed 04/15/2019 showing no seizure activity. Neurology follow-up currently maintained on aspirin 81 mg daily as well as Brilinta for CVA prophylaxis. Subcutaneous Lovenox for DVT prophylaxis. Speech therapy follow-up for confusion with cognitive decline patient was placed on Aricept as well as scheduled Seroquel for bouts of  restlessness and agitation. 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 Dysphasia #1 thin liquid diet. On 05/28/2019 patient spiked a fever 102.3 with urinalysis negative chest x-ray unremarkable and blood cultures no growth to date. Urine culture did show staph hemolyticusand enterococcus faecalisand placed on Rocephin x5 days with antibiotic completed .  Patient transferred to CIR on 06/03/2019 .    Patient currently requires max with basic self-care skills secondary to muscle weakness, decreased cardiorespiratoy endurance, ataxia, decreased coordination and decreased motor planning, decreased visual acuity, field cut and hemianopsia, decreased attention to left, decreased initiation, decreased attention, decreased awareness, decreased problem solving, decreased safety awareness and decreased memory and decreased sitting balance, decreased standing balance, decreased postural control, hemiplegia and decreased balance strategies.  Prior to hospitalization, patient could complete ADls and IADLs with independent .  Patient will benefit from skilled intervention to decrease level of assist with basic self-care skills prior to discharge home with care partner.  Anticipate patient will require 24 hour supervision and minimal physical assistance and follow up home health.  OT - End of Session Activity Tolerance: Decreased this session Endurance Deficit: Yes Endurance Deficit Description: multiple rest breaks secondary to fatigue OT Assessment Rehab Potential (ACUTE ONLY): Good OT Barriers to Discharge: Decreased caregiver support;Behavior OT Patient demonstrates impairments in the following area(s):  Behavior;Balance;Cognition;Endurance;Motor;Perception;Safety OT Basic ADL's Functional Problem(s): Grooming;Bathing;Dressing;Toileting OT Transfers Functional Problem(s): Toilet;Tub/Shower OT Additional Impairment(s): Fuctional Use of Upper Extremity OT Plan OT Intensity: Minimum of 1-2 x/day, 45 to 90 minutes OT Frequency: 5 out of 7 days OT Duration/Estimated Length of Stay: 2 weeks OT Treatment/Interventions: Balance/vestibular training;Cognitive remediation/compensation;Functional mobility training;Community reintegration;Discharge planning;Pain management;Visual/perceptual remediation/compensation;UE/LE Coordination activities;Neuromuscular re-education;UE/LE Strength taining/ROM;Self Care/advanced ADL retraining;Functional electrical stimulation;Psychosocial support;Therapeutic Exercise;DME/adaptive equipment instruction;Patient/family education;Therapeutic Activities;Wheelchair propulsion/positioning OT Self Feeding Anticipated Outcome(s): n/a OT Basic Self-Care Anticipated Outcome(s): S - min A OT Toileting Anticipated Outcome(s): min A OT Bathroom Transfers Anticipated Outcome(s): min A OT Recommendation Recommendations for Other Services: Other (comment)(none at this time) Patient destination: Home Follow Up Recommendations: 24 hour supervision/assistance;Home health OT Equipment Recommended: To be determined   Skilled Therapeutic Intervention Upon entering the room, pt seated in recliner chair with no signs or symptoms of pain this session. NT present and providing supervision for meal with therapist taking over. Pt is very slow eater and eating for another 30 minutes of this session. Pt unable to locate food items on L side of tray without max cuing for head turn. Pt unaware of BM in brief. Pt ambulating with mod A and without use of AD into bathroom. Pt needing total A for hygiene and clothing management. Pt was able to attempt to clean self with L UE but needing assistance for  thoroughness. Pt seated at sink for grooming tasks with mod A overall and increased time with max cuing for initiation and sequencing. Pt returning to recliner chair at end of session. OT spoke to wife on phone and requested clothing items to be brought. Chair alarm belt donned for safety with all bell placed within reach.   OT Evaluation Precautions/Restrictions  Precautions Precautions: Fall Precaution Comments: Left lateral lean, Left hemianopsia, impulsive Restrictions Weight Bearing Restrictions: No General   Vital Signs Therapy Vitals Temp: 98.4 F (36.9 C) Temp Source: Oral Pulse Rate: Marland Kitchen)  101 BP: (!) 142/83 Patient Position (if appropriate): Lying Oxygen Therapy SpO2: 93 % O2 Device: Nasal Cannula Pain Pain Assessment Pain Scale: Faces Faces Pain Scale: No hurt Home Living/Prior Functioning Home Living Family/patient expects to be discharged to:: Private residence Living Arrangements: Spouse/significant other Available Help at Discharge: Family, Available 24 hours/day Type of Home: House Home Access: Stairs to enter [employer] of Steps: 3 Entrance Stairs-Rails: Right, Left, Can reach both Home Layout: One level  Lives With: Spouse IADL History Education: unknown Prior Function Level of Independence: Independent with basic ADLs 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 Vision Baseline Vision/History: No visual deficits Patient Visual Report: No change from baseline Vision Assessment?: Vision impaired- to be further tested in functional context Eye Alignment: Within Functional Limits Ocular Range of Motion: Restricted on the left Tracking/Visual Pursuits: Left eye does not track laterally Additional Comments: Cuing to scan to the   L Perception  Perception: Impaired Inattention/Neglect: Does not attend to left side of body Cognition Overall Cognitive Status: Impaired/Different from baseline Arousal/Alertness:  Awake/alert Orientation Level: Person Year: (does not comments when given choice) Month: (does not comment when given choice) Day of Week: (does not comment when given choice) Memory: (unable to evaluate at this time d/t linguistic deficits) Attention: Sustained Sustained Attention: Impaired Sustained Attention Impairment: Verbal basic;Functional basic Awareness: Impaired Awareness Impairment: Intellectual impairment Problem Solving: Impaired Problem Solving Impairment: Verbal basic;Functional basic Executive Function: (all impacted by lower level deficits) Behaviors: Restless;Impulsive Safety/Judgment: Impaired Sensation Sensation Light Touch: Impaired by gross assessment Hot/Cold: Impaired by gross assessment Proprioception: Impaired by gross assessment Coordination Gross Motor Movements are Fluid and Coordinated: No Fine Motor Movements are Fluid and Coordinated: No Coordination and Movement Description: apparent proprioceptive and coordination deficit in both UE and LE Motor  Motor Motor: Ataxia;Hemiplegia Mobility  Bed Mobility Bed Mobility: Rolling Right;Rolling Left;Supine to Sit;Sit to Supine Rolling Right: Minimal Assistance - Patient > 75% Rolling Left: Minimal Assistance - Patient > 75% Supine to Sit: Minimal Assistance - Patient > 75% Sit to Supine: Minimal Assistance - Patient > 75% Transfers Sit to Stand: Moderate Assistance - Patient 50-74% Stand to Sit: Moderate Assistance - Patient 50-74%  Trunk/Postural Assessment  Cervical Assessment Cervical Assessment: Within Functional Limits Thoracic Assessment Thoracic Assessment: Within Functional Limits Lumbar Assessment Lumbar Assessment: Within Functional Limits Postural Control Postural Control: Deficits on evaluation Trunk Control: trunk lean L>R  Balance Balance Balance Assessed: Yes Static Sitting Balance Static Sitting - Balance Support: Bilateral upper extremity supported Static Sitting - Level of  Assistance: 5: Stand by assistance Static Standing Balance Static Standing - Balance Support: Bilateral upper extremity supported Static Standing - Level of Assistance: 3: Mod assist Dynamic Standing Balance Dynamic Standing - Level of Assistance: 3: Mod assist;2: Max assist Extremity/Trunk Assessment RUE Assessment RUE Assessment: Within Functional Limits LUE Assessment LUE Assessment: Exceptions to [facility] Passive Range of Motion (PROM) Comments: WFLs Active Range of Motion (AROM) Comments: 3-/5 General Strength Comments: movements very ataxic     Refer to Care Plan for Long Term Goals  Recommendations for other services: None    Discharge Criteria: Patient will be discharged from OT if patient refuses treatment 3 consecutive times without medical reason, if treatment goals not met, if there is a change in medical status, if patient makes no progress towards goals or if patient is discharged from hospital.  The above assessment, treatment plan, treatment alternatives and goals were discussed and mutually agreed upon: by patient  Gypsy Decant 06/04/2019, 5:03 PM

## 2019-06-05 ENCOUNTER — Inpatient Hospital Stay (HOSPITAL_COMMUNITY): Payer: Self-pay | Admitting: Occupational Therapy

## 2019-06-05 ENCOUNTER — Inpatient Hospital Stay (HOSPITAL_COMMUNITY): Payer: Self-pay | Admitting: Speech Pathology

## 2019-06-05 ENCOUNTER — Inpatient Hospital Stay (HOSPITAL_COMMUNITY): Payer: Self-pay | Admitting: Physical Therapy

## 2019-06-05 LAB — GLUCOSE, CAPILLARY
Glucose-Capillary: 110 mg/dL — ABNORMAL HIGH (ref 70–99)
Glucose-Capillary: 149 mg/dL — ABNORMAL HIGH (ref 70–99)
Glucose-Capillary: 93 mg/dL (ref 70–99)
Glucose-Capillary: 158 mg/dL — ABNORMAL HIGH (ref 70–99)

## 2019-06-05 NOTE — Progress Notes (Signed)
Occupational Therapy Session Note  Patient Details  Name: Joe Perry MRN: [id number] Date of Birth: 07-Sep-1950  Today's Date: 06/05/2019 OT Individual Time: [id number] OT Individual Time Calculation (min): 55 min    Short Term Goals: Week 1:  OT Short Term Goal 1 (Week 1): Pt will perform shower transfer with  mod A overall. OT Short Term Goal 2 (Week 1): Pt will perform LB dresing with mod A overall. OT Short Term Goal 3 (Week 1): Pt will attend to L side during functional task with mod cuing as needed. OT Short Term Goal 4 (Week 1): Pt will maintain standing balance during self care tasks with mod A.  Skilled Therapeutic Interventions/Progress Updates:    Upon entering the room, pt supine in bed with no signs or symptoms of pain. Pt performed bed mobility with min A for trunk. Pt standing with mod A and ambulating with RW into bathroom with mod A for balance, safety, and RW advancement. Pt seated on toilet with total A for clothing management but unable to void. Clothing donned while seated for safety. Pt needing max A to don and button shirt and total A for LB pants. Pt needing assistance and cuing with sequencing, initiation, and fasteners this session. Pt returning to sit in recliner chair with set up A to open items on breakfast tray. Pt feeding self with utensils. OT giving cues for pt to not take large bites but he is unable to correct. Therapist scooping food off of utensil before pt placing in mouth for safety. Increased time to allow pt to eat and therefore NT arrived to take over supervision for session. Call bell and all needed items within reach.   Therapy Documentation Precautions:  Precautions Precautions: Fall Precaution Comments: Left lateral lean, Left hemianopsia, impulsive Restrictions Weight Bearing Restrictions: No   Therapy/Group: Individual Therapy  Gypsy Decant 06/05/2019, 12:21 PM

## 2019-06-05 NOTE — Progress Notes (Signed)
Physical Therapy Session Note  Patient Details  Name: Joe Perry MRN: [id number] Date of Birth: Oct 25, 1950  Today's Date: 06/05/2019 PT Individual Time: 1829-9371 PT Individual Time Calculation (min): 75 min   Short Term Goals: Week 1:  PT Short Term Goal 1 (Week 1): Pt will ambulate 75 feet with LRAD and minA. PT Short Term Goal 2 (Week 1): Pt will complete 4 steps with minA. PT Short Term Goal 3 (Week 1): Pt will perform sit to stand with minA.  Skilled Therapeutic Interventions/Progress Updates:  Pt sitting up in recliner upon arrival for session. Pt agreeable to treatment. Pt completed sit>stand and transferred to the wheelchair with minA and RW. Pt required verbal cueing for task and safety. Pt transported to therapy gym in wheelchair. Pt completed multiple trials of gait with minA mostly requiring occassional modA. Pt required verbal cueing for task and safety and tactile cueing for walker placement and direction. Pt ambulated the following distances: 30 feet, 100 feet, 100 feet: all with RW, 30 feet: with L hand held assist, 100 feet: with R UE over therapist's shoulder. Pt took seated rest breaks in between each trial of gait d/t fatigue. Pt completed sit>stand with minA and RW and completed 2 trials of bean bag toss with 12 bean bags each trial. Pt challenged limits of stability by reaching for bean bags on the R side and tossing them at the corn hole board.Pt required verbal cueing for increasing BOS for safety and stability. In addition, the corn hole board was repositioned multiple times to target pt's visual scanning to his L side. Then pt ambulated 150 feet with RW and minA to the day room. Pt completed standing task with peg board and picture to complete. Pt able to complete peg board appropriately after max verbal cueing for problem solving, increasing BOS, and visual scanning to the L side. Pt has apparent visual field cut on the L. Pt ambulated 100 feet back to his room with RW  and minA requiring verbal cueing for directions and tactile cueing for walker placement. Pt sat down in the recliner in his room, safety belt fastened, call bell within reach, and all needs met at this time.     Therapy Documentation Precautions:  Precautions Precautions: Fall Precaution Comments: Left lateral lean, Left hemianopsia, impulsive Restrictions Weight Bearing Restrictions: No   Pain: Denies Pain    Therapy/Group: Individual Therapy  Olena Leatherwood, SPT  06/05/2019, 5:49 PM

## 2019-06-05 NOTE — Progress Notes (Signed)
Speech Language Pathology Daily Session Note  Patient Details  Name: Joe Perry MRN: [id number] Date of Birth: February 18, 1951  Today's Date: 06/05/2019 SLP Individual Time: 1000-1030, 1130-1200 SLP Individual Time Calculation (min): 30 min; 30 min  Short Term Goals: Week 1: SLP Short Term Goal 1 (Week 1): Pt will utlize multi-modal communication to indicate wants and needs with Mod A cues. SLP Short Term Goal 2 (Week 1): Pt will follow 2 step directions related to basic ADL in 7 out of 10 opportunities with Mod A cues. SLP Short Term Goal 3 (Week 1): Pt will complete familiar problem solving tasks related to ADLs with Mod A cues. SLP Short Term Goal 4 (Week 1): Pt will consume trials of dysphagia 2 diet with no overt s/s of aspiration/dysphagia over 3 sessions prior to upgraded with Min A cues for compensatory swallow strategies.  Skilled Therapeutic Interventions:  Skilled treatment session #1 focused on communication goals. SLP presented pt with 2 objects and despite Max A multimodal cues, pt not able to select requested object from field of 2. SLP further facilitated session by providing Max A cues to follow 1 step directions related to himself. Pt with ability in 5 out of 10 opportunities. Pt very restless throughout session, difficult to direct to task.   Skilled treatment session #2 focused on dysphagia and cognition goals. SLP facilitated session by providing skilled observation of pt consuming trial tray of dysphagia 2 lunch with thin liquids. Pt will significant difficulty with motor pattern to mouth and required tactile cues. Additionally, pt with perseverative mastication of both puree and dysphagia 2 but with increased oral residue with dysphagia 2 textures. Pt's mastication was frequently more than 3 minutes per bolus. Despite Max A cues, pt not receptive liquid wash or decreasing mastication. Recommend pt continue dysphagia 1 diet with thin liquids with education provided to pt's  nurse and NT on current recommendation. Pt left upright in recliner, lap belt alarm on tele-sitter present.       Pain    Therapy/Group: Individual Therapy  Mirra Basilio 06/05/2019, 12:41 PM

## 2019-06-05 NOTE — Progress Notes (Signed)
[facility] PHYSICAL MEDICINE & REHABILITATION PROGRESS NOTE   Subjective/Complaints:  No pain c/o  Review of systems negative for chest pain shortness of breath nausea vomiting diarrhea constipation   Objective:   Vas [place] Lower Extremity Venous (dvt)  Result Date: 06/04/2019  Lower Venous Study Indications: Swelling.  Limitations: Movement and unable to fully follow instructions. Comparison Study: No previous study for comparison. Performing Technologist: Toma Copier RVS  Examination Guidelines: A complete evaluation includes B-mode imaging, spectral Doppler, color Doppler, and power Doppler as needed of all accessible portions of each vessel. Bilateral testing is considered an integral part of a complete examination. Limited examinations for reoccurring indications may be performed as noted.  +---------+---------------+---------+-----------+----------+--------------+ RIGHT    CompressibilityPhasicitySpontaneityPropertiesThrombus Aging +---------+---------------+---------+-----------+----------+--------------+ CFV      Full           Yes      Yes                                 +---------+---------------+---------+-----------+----------+--------------+ SFJ      Full                                                        +---------+---------------+---------+-----------+----------+--------------+ FV Prox  Full           Yes      Yes                                 +---------+---------------+---------+-----------+----------+--------------+ FV Mid   Full                                                        +---------+---------------+---------+-----------+----------+--------------+ FV DistalFull           Yes      Yes                                 +---------+---------------+---------+-----------+----------+--------------+ PFV      Full           Yes      Yes                                  +---------+---------------+---------+-----------+----------+--------------+ POP      Full           Yes      Yes                                 +---------+---------------+---------+-----------+----------+--------------+ PTV      Full                                                        +---------+---------------+---------+-----------+----------+--------------+ PERO     Full                                                        +---------+---------------+---------+-----------+----------+--------------+  Right Technical Findings: Technically difficult due to movement  +---------+---------------+---------+-----------+----------+--------------+ LEFT     CompressibilityPhasicitySpontaneityPropertiesThrombus Aging +---------+---------------+---------+-----------+----------+--------------+ CFV      Full           Yes      Yes                                 +---------+---------------+---------+-----------+----------+--------------+ SFJ      Full                                                        +---------+---------------+---------+-----------+----------+--------------+ FV Prox  Full           Yes      Yes                                 +---------+---------------+---------+-----------+----------+--------------+ FV Mid   Full                                                        +---------+---------------+---------+-----------+----------+--------------+ FV DistalFull           Yes      Yes                                 +---------+---------------+---------+-----------+----------+--------------+ PFV      Full           Yes      Yes                                 +---------+---------------+---------+-----------+----------+--------------+ POP      Full           Yes      Yes                                 +---------+---------------+---------+-----------+----------+--------------+ PTV      Full                                                         +---------+---------------+---------+-----------+----------+--------------+ PERO     Full                                                        +---------+---------------+---------+-----------+----------+--------------+   Left Technical Findings: Technically difficult due to movement   Summary: Right: There is no evidence of deep vein thrombosis in the lower extremity. However, portions of this examination were limited- see technologist comments above. No cystic structure found in the popliteal fossa. Left: There is no evidence of deep vein thrombosis in  the lower extremity. However, portions of this examination were limited- see technologist comments above. No cystic structure found in the popliteal fossa.  *See table(s) above for measurements and observations. Electronically signed by Harold Barban MD on 06/04/2019 at 4:08:08 PM.    Final    Recent Labs    06/03/19 1555 06/04/19 0541  WBC 5.4 5.3  HGB 11.3* 11.6*  HCT 33.0* 35.0*  PLT 347 372   Recent Labs    06/03/19 1555 06/04/19 0541  NA  --  137  K  --  3.8  CL  --  102  CO2  --  21*  GLUCOSE  --  94  BUN  --  9  CREATININE 0.65 0.59*  CALCIUM  --  9.1    Intake/Output Summary (Last 24 hours) at 06/05/2019 0810 Last data filed at 06/04/2019 1840 Gross per 24 hour  Intake 720 ml  Output -  Net 720 ml     Physical Exam: Vital Signs Blood pressure 123/86, pulse (!) 54, temperature 98.6 F (37 C), resp. rate 18, SpO2 90 %.    General: No acute distress Mood and affect are appropriate Heart: Regular rate and rhythm no rubs murmurs or extra sounds Lungs: Clear to auscultation, breathing unlabored, no rales or wheezes Abdomen: Positive bowel sounds, soft nontender to palpation, nondistended Extremities: No clubbing, cyanosis, or edema Skin: No evidence of breakdown, no evidence of rash Neurologic: Cranial nerves II through XII intact, motor strength is 5/5 in Right an d4/5 Left   deltoid, bicep,  tricep, grip, hip flexor, knee extensors, ankle dorsiflexor and plantar flexor Sensory exam normal sensation to light touch and proprioception in bilateral upper and lower extremities Cerebellar exam normal finger to nose to finger as well as heel to shin in bilateral upper and lower extremities Musculoskeletal: Full range of motion in all 4 extremities. No joint swelling  Assessment/Plan: 1. Functional deficits secondary to RIght PCA infarct with left hemiataxia which require 3+ hours per day of interdisciplinary therapy in a comprehensive inpatient rehab setting.  Physiatrist is providing close team supervision and 24 hour management of active medical problems listed below.  Physiatrist and rehab team continue to assess barriers to discharge/monitor patient progress toward functional and medical goals  Care Tool:  Bathing    Body parts bathed by patient: Chest, Abdomen, Front perineal area, Right upper leg, Face   Body parts bathed by helper: Right arm, Left arm, Buttocks, Right lower leg, Left lower leg, Left upper leg     Bathing assist Assist Level: Maximal Assistance - Patient 24 - 49%     Upper Body Dressing/Undressing Upper body dressing   What is the patient wearing?: Hospital gown only    Upper body assist Assist Level: Maximal Assistance - Patient 25 - 49%    Lower Body Dressing/Undressing Lower body dressing      What is the patient wearing?: Incontinence brief     Lower body assist Assist for lower body dressing: Total Assistance - Patient < 25%     Toileting Toileting    Toileting assist Assist for toileting: Maximal Assistance - Patient 25 - 49%     Transfers Chair/bed transfer  Transfers assist     Chair/bed transfer assist level: Moderate Assistance - Patient 50 - 74%     Locomotion Ambulation   Ambulation assist      Assist level: Moderate Assistance - Patient 50 - 74% Assistive device: Hand held assist Max distance: 25'   Walk 10  feet activity   Assist     Assist level: Moderate Assistance - Patient - 50 - 74% Assistive device: Walker-rolling   Walk 50 feet activity   Assist    Assist level: Moderate Assistance - Patient - 50 - 74% Assistive device: Walker-rolling    Walk 150 feet activity   Assist Walk 150 feet activity did not occur: Safety/medical concerns         Walk 10 feet on uneven surface  activity   Assist Walk 10 feet on uneven surfaces activity did not occur: Safety/medical concerns         Wheelchair     Assist Will patient use wheelchair at discharge?: Yes Type of Wheelchair: Manual    Wheelchair assist level: Moderate Assistance - Patient 50 - 74% Max wheelchair distance: 75    Wheelchair 50 feet with 2 turns activity    Assist        Assist Level: Moderate Assistance - Patient 50 - 74%   Wheelchair 150 feet activity     Assist  Wheelchair 150 feet activity did not occur: Safety/medical concerns       Blood pressure 123/86, pulse (!) 54, temperature 98.6 F (37 C), resp. rate 18, SpO2 90 %.  Medical Problem List and Plan: 1.Left-sided weakness with right gaze preference and left neglectsecondary to right large PCA infarction status post IR revascularization occludedR PCOM and PCAas well as history of CVA in May 2017 and March 2020 -CIR PT, OT             -ELOS 10-14 days, supervision goals 2. Antithrombotics: -DVT/anticoagulation:Lovenox             -check venous dopplers today -antiplatelet therapy: Aspirin 81 mg daily, Brilinta 90 mg twice daily 3. Pain Management:Tylenol as needed 4. Mood:Aricept 10 mg nightly, -antipsychotic agents: seroquel 25mg  tid -try to reduce to limit day time sedation -start sleep chart 5. Neuropsych: This patientis notcapable of making decisions on hisown behalf. 6. Skin/Wound Care:routine care, skin checks. Improve  nutrition 7. Fluids/Electrolytes/Nutrition:Routine in and outs with follow-up chemistries 8. Hypertension. Norvasc 5 mg daily. Monitor with increased mobility 9. Diabetes mellitus. Hemoglobin A1c 9.8.  -continue glucophage 500 mg twice daily. -cbg's ac and hs -titrate regimen as needed - CBG (last 3)  Recent Labs    06/04/19 1722 06/04/19 2113 06/05/19 0621  GLUCAP 128* 146* 110*  Controlled  10. UTI/enterococcusfaecalis. Completed 5-day course of Rocephin 11. Hyperlipidemia. Lipitor 12. Dysphagia. Dysphasia #1 thin liquid. Follow-up speech therapy and advance as tolerated 13. Left upper extremity involuntary movement/hemichorea?EEG negative -appears to be weakened and ataxic on exam although pt was inconsistent with effort--continue to follow    LOS: 2 days A FACE TO [place] E Kirsteins 06/05/2019, 8:10 AM

## 2019-06-06 ENCOUNTER — Inpatient Hospital Stay (HOSPITAL_COMMUNITY): Payer: Self-pay | Admitting: *Deleted

## 2019-06-06 ENCOUNTER — Inpatient Hospital Stay (HOSPITAL_COMMUNITY): Payer: Self-pay | Admitting: Physical Therapy

## 2019-06-06 ENCOUNTER — Inpatient Hospital Stay (HOSPITAL_COMMUNITY): Payer: Self-pay | Admitting: Speech Pathology

## 2019-06-06 ENCOUNTER — Inpatient Hospital Stay (HOSPITAL_COMMUNITY): Payer: Self-pay | Admitting: Occupational Therapy

## 2019-06-06 LAB — GLUCOSE, CAPILLARY
Glucose-Capillary: 108 mg/dL — ABNORMAL HIGH (ref 70–99)
Glucose-Capillary: 137 mg/dL — ABNORMAL HIGH (ref 70–99)
Glucose-Capillary: 138 mg/dL — ABNORMAL HIGH (ref 70–99)
Glucose-Capillary: 94 mg/dL (ref 70–99)

## 2019-06-06 NOTE — Progress Notes (Signed)
Recreational Therapy Session Note  Patient Details  Name: Joe Perry MRN: [id number] Date of Birth: 02/03/1951 Today's Date: 06/06/2019  Attempted TR eval today, and pt had difficulty staying awake with verbal and tactile cues.  In discussion with team, pt is not yet appropriate for TR services.  Eval deferred, will continue to monitor through team for future participation. Maxime Beckner 06/06/2019, 9:53 AM

## 2019-06-06 NOTE — Progress Notes (Signed)
[facility] Individual Statement of Services  Patient Name:  Joe Perry  Date:  06/06/2019  Welcome to the [facility].  Our goal is to provide you with an individualized program based on your diagnosis and situation, designed to meet your specific needs.  With this comprehensive rehabilitation program, you will be expected to participate in at least 3 hours of rehabilitation therapies Monday-Friday, with modified therapy programming on the weekends.  Your rehabilitation program will include the following services:  Physical Therapy (PT), Occupational Therapy (OT), Speech Therapy (ST), 24 hour per day rehabilitation nursing, Neuropsychology, Case Management (Social Worker), Rehabilitation Medicine, Nutrition Services and Pharmacy Services  Weekly team conferences will be held on Wednesdays to discuss your progress.  Your Social Worker will talk with you frequently to get your input and to update you on team discussions.  Team conferences with you and your family in attendance may also be held.  Expected length of stay:  2 to 2 1/2 weeks  Overall anticipated outcome:  Minimal Assistance  Depending on your progress and recovery, your program may change. Your Social Worker will coordinate services and will keep you informed of any changes. Your Social Worker's name and contact numbers are listed  below.  The following services may also be recommended but are not provided by the [facility] will be made to provide these services after discharge if needed.  Arrangements include referral to agencies that provide these services.  Your insurance has been verified to be:  Medicaid has been applied for Your primary doctor is:  No one at this time  Pertinent information will be shared with your  doctor and your insurance company.  Social Worker:  Ovidio Kin, [place] or ([phone]  Information discussed with and copy given to patient by: Trey Sailors, 06/06/2019, 9:26 PM

## 2019-06-06 NOTE — Progress Notes (Signed)
Speech Language Pathology Daily Session Note  Patient Details  Name: Joe Perry MRN: [id number] Date of Birth: 12/10/1950  Today's Date: 06/06/2019 SLP Individual Time: 0710-0810 SLP Individual Time Calculation (min): 60 min  Short Term Goals: Week 1: SLP Short Term Goal 1 (Week 1): Pt will utlize multi-modal communication to indicate wants and needs with Mod A cues. SLP Short Term Goal 2 (Week 1): Pt will follow 2 step directions related to basic ADL in 7 out of 10 opportunities with Mod A cues. SLP Short Term Goal 3 (Week 1): Pt will complete familiar problem solving tasks related to ADLs with Mod A cues. SLP Short Term Goal 4 (Week 1): Pt will consume trials of dysphagia 2 diet with no overt s/s of aspiration/dysphagia over 3 sessions prior to upgraded with Min A cues for compensatory swallow strategies.  Skilled Therapeutic Interventions:  Skilled treatment session focused on dysphagia and communication goals. SLP recieved pt in bed and pt required Max A verbal and visual cues for bed mobility to reposition himself for safe PO intake. SLP further facilitated session by providing skilled observation of pt consuming dysphagia 1 breakfast with thin liquids. Pt required Max A tactile and verbal cues to initiate and coordinate motor movement when self-feeding with RUE. He also required consistent Max A tactile and verbal cues to load small amount of bolus onto spoon. Suspect this is in large part related to motor deficits. Pt continues to be very restless throughout session and needs frequent repositioning. His oral motor patterns continue to be perseverative and lengthy with each bolus.   SLP further facilitated session with yes/no orientation questions. With Max A cues, pt achieved ~ 50 % accuracy with frequent repetition of part of question. Printed letters and words were presented to assess letter recognition. Pt unable to recognize letters and unable to read words even with object  present.   Pt left upright in bed, bed alarm on and all needs within reach. Tele-sitter present.      Pain    Therapy/Group: Individual Therapy  Analeise Mccleery 06/06/2019, 9:02 AM

## 2019-06-06 NOTE — Progress Notes (Signed)
[facility] PHYSICAL MEDICINE & REHABILITATION PROGRESS NOTE   Subjective/Complaints:  No issues overnite , discussed with SLP, has mostly oral dysphagia   Review of systems negative for chest pain shortness of breath nausea vomiting diarrhea constipation   Objective:   Vas [place] Lower Extremity Venous (dvt)  Result Date: 06/04/2019  Lower Venous Study Indications: Swelling.  Limitations: Movement and unable to fully follow instructions. Comparison Study: No previous study for comparison. Performing Technologist: Toma Copier RVS  Examination Guidelines: A complete evaluation includes B-mode imaging, spectral Doppler, color Doppler, and power Doppler as needed of all accessible portions of each vessel. Bilateral testing is considered an integral part of a complete examination. Limited examinations for reoccurring indications may be performed as noted.  +---------+---------------+---------+-----------+----------+--------------+ RIGHT    CompressibilityPhasicitySpontaneityPropertiesThrombus Aging +---------+---------------+---------+-----------+----------+--------------+ CFV      Full           Yes      Yes                                 +---------+---------------+---------+-----------+----------+--------------+ SFJ      Full                                                        +---------+---------------+---------+-----------+----------+--------------+ FV Prox  Full           Yes      Yes                                 +---------+---------------+---------+-----------+----------+--------------+ FV Mid   Full                                                        +---------+---------------+---------+-----------+----------+--------------+ FV DistalFull           Yes      Yes                                 +---------+---------------+---------+-----------+----------+--------------+ PFV      Full           Yes      Yes                                  +---------+---------------+---------+-----------+----------+--------------+ POP      Full           Yes      Yes                                 +---------+---------------+---------+-----------+----------+--------------+ PTV      Full                                                        +---------+---------------+---------+-----------+----------+--------------+  PERO     Full                                                        +---------+---------------+---------+-----------+----------+--------------+   Right Technical Findings: Technically difficult due to movement  +---------+---------------+---------+-----------+----------+--------------+ LEFT     CompressibilityPhasicitySpontaneityPropertiesThrombus Aging +---------+---------------+---------+-----------+----------+--------------+ CFV      Full           Yes      Yes                                 +---------+---------------+---------+-----------+----------+--------------+ SFJ      Full                                                        +---------+---------------+---------+-----------+----------+--------------+ FV Prox  Full           Yes      Yes                                 +---------+---------------+---------+-----------+----------+--------------+ FV Mid   Full                                                        +---------+---------------+---------+-----------+----------+--------------+ FV DistalFull           Yes      Yes                                 +---------+---------------+---------+-----------+----------+--------------+ PFV      Full           Yes      Yes                                 +---------+---------------+---------+-----------+----------+--------------+ POP      Full           Yes      Yes                                 +---------+---------------+---------+-----------+----------+--------------+ PTV      Full                                                         +---------+---------------+---------+-----------+----------+--------------+ PERO     Full                                                        +---------+---------------+---------+-----------+----------+--------------+  Left Technical Findings: Technically difficult due to movement   Summary: Right: There is no evidence of deep vein thrombosis in the lower extremity. However, portions of this examination were limited- see technologist comments above. No cystic structure found in the popliteal fossa. Left: There is no evidence of deep vein thrombosis in the lower extremity. However, portions of this examination were limited- see technologist comments above. No cystic structure found in the popliteal fossa.  *See table(s) above for measurements and observations. Electronically signed by Harold Barban MD on 06/04/2019 at 4:08:08 PM.    Final    Recent Labs    06/03/19 1555 06/04/19 0541  WBC 5.4 5.3  HGB 11.3* 11.6*  HCT 33.0* 35.0*  PLT 347 372   Recent Labs    06/03/19 1555 06/04/19 0541  NA  --  137  K  --  3.8  CL  --  102  CO2  --  21*  GLUCOSE  --  94  BUN  --  9  CREATININE 0.65 0.59*  CALCIUM  --  9.1    Intake/Output Summary (Last 24 hours) at 06/06/2019 0809 Last data filed at 06/06/2019 0759 Gross per 24 hour  Intake 960 ml  Output -  Net 960 ml     Physical Exam: Vital Signs Blood pressure 120/65, pulse 70, temperature 98 F (36.7 C), resp. rate 18, SpO2 99 %.    General: No acute distress Mood and affect are appropriate Heart: Regular rate and rhythm no rubs murmurs or extra sounds Lungs: Clear to auscultation, breathing unlabored, no rales or wheezes Abdomen: Positive bowel sounds, soft nontender to palpation, nondistended Extremities: No clubbing, cyanosis, or edema Skin: No evidence of breakdown, no evidence of rash Neurologic: Cranial nerves II through XII intact, motor strength is 5/5 in Right an d4/5 Left   deltoid, bicep, tricep,  grip, hip flexor, knee extensors, ankle dorsiflexor and plantar flexor Sensory exam normal sensation to light touch  in bilateral upper and lower extremities Cerebellar exam normal finger to nose to finger as well as heel to shin in right  upper and lower extremities, Left side moderate dysmetria on LUE and mild LLE  Musculoskeletal: Full range of motion in all 4 extremities. No joint swelling  Assessment/Plan: 1. Functional deficits secondary to RIght PCA infarct with left hemiataxia which require 3+ hours per day of interdisciplinary therapy in a comprehensive inpatient rehab setting.  Physiatrist is providing close team supervision and 24 hour management of active medical problems listed below.  Physiatrist and rehab team continue to assess barriers to discharge/monitor patient progress toward functional and medical goals  Care Tool:  Bathing    Body parts bathed by patient: Chest, Abdomen, Front perineal area, Right upper leg, Face   Body parts bathed by helper: Right arm, Left arm, Buttocks, Right lower leg, Left lower leg, Left upper leg     Bathing assist Assist Level: Maximal Assistance - Patient 24 - 49%     Upper Body Dressing/Undressing Upper body dressing   What is the patient wearing?: Button up shirt    Upper body assist Assist Level: Total Assistance - Patient < 25%    Lower Body Dressing/Undressing Lower body dressing      What is the patient wearing?: Pants, Underwear/pull up     Lower body assist Assist for lower body dressing: Total Assistance - Patient < 25%     Toileting Toileting    Toileting assist Assist for toileting: Moderate Assistance - Patient 50 -  74%     Transfers Chair/bed transfer  Transfers assist     Chair/bed transfer assist level: Minimal Assistance - Patient > 75%     Locomotion Ambulation   Ambulation assist      Assist level: Minimal Assistance - Patient > 75% Assistive device: Walker-rolling Max distance: 150    Walk 10 feet activity   Assist     Assist level: Minimal Assistance - Patient > 75% Assistive device: Walker-rolling   Walk 50 feet activity   Assist    Assist level: Minimal Assistance - Patient > 75% Assistive device: Walker-rolling    Walk 150 feet activity   Assist Walk 150 feet activity did not occur: Safety/medical concerns  Assist level: Minimal Assistance - Patient > 75% Assistive device: Walker-rolling    Walk 10 feet on uneven surface  activity   Assist Walk 10 feet on uneven surfaces activity did not occur: Safety/medical concerns         Wheelchair     Assist Will patient use wheelchair at discharge?: Yes Type of Wheelchair: Manual    Wheelchair assist level: Moderate Assistance - Patient 50 - 74% Max wheelchair distance: 75    Wheelchair 50 feet with 2 turns activity    Assist        Assist Level: Moderate Assistance - Patient 50 - 74%   Wheelchair 150 feet activity     Assist  Wheelchair 150 feet activity did not occur: Safety/medical concerns       Blood pressure 120/65, pulse 70, temperature 98 F (36.7 C), resp. rate 18, SpO2 99 %.  Medical Problem List and Plan: 1.Left-sided weakness with right gaze preference and left neglectsecondary to right large PCA infarction status post IR revascularization occludedR PCOM and PCAas well as history of CVA in May 2017 and March 2020 -CIR PT, OT             -ELOS 10-14 days, supervision goals 2. Antithrombotics: -DVT/anticoagulation:Lovenox             -check venous dopplers today -antiplatelet therapy: Aspirin 81 mg daily, Brilinta 90 mg twice daily 3. Pain Management:Tylenol as needed 4. Mood:Aricept 10 mg nightly, -antipsychotic agents: seroquel 25mg  tid -try to reduce to limit day time sedation -start sleep chart 5. Neuropsych: This patientis notcapable of making decisions on hisown  behalf. 6. Skin/Wound Care:routine care, skin checks. Improve nutrition 7. Fluids/Electrolytes/Nutrition:Routine in and outs with follow-up chemistries 8. Hypertension. Norvasc 5 mg daily. Monitor with increased mobility Vitals:   06/05/19 1938 06/06/19 0338  BP: 123/74 120/65  Pulse: 90 70  Resp: 18 18  Temp: 98 F (36.7 C) 98 F (36.7 C)  SpO2: 100% 99%   9. Diabetes mellitus. Hemoglobin A1c 9.8.  -continue glucophage 500 mg twice daily. -cbg's ac and hs -titrate regimen as needed - CBG (last 3)  Recent Labs    06/05/19 1646 06/05/19 2127 06/06/19 0630  GLUCAP 93 158* 108*  Controlled 10/2 10. UTI/enterococcusfaecalis. Completed 5-day course of Rocephin 11. Hyperlipidemia. Lipitor 12. Dysphagia. Dysphasia #1 thin liquid. Follow-up speech therapy and advance as tolerated 13. Left upper extremity involuntary movement/hemichorea?EEG negative -appears to be weakened and ataxic on exam although pt was inconsistent with effort--continue to follow    LOS: 3 days A FACE TO FACE EVALUATION WAS PERFORMED  Charlett Blake 06/06/2019, 8:09 AM

## 2019-06-06 NOTE — Progress Notes (Signed)
Occupational Therapy Session Note  Patient Details  Name: Joe Perry MRN: [id number] Date of Birth: 10-Aug-1951  Today's Date: 06/06/2019 OT Individual Time: 1001-1044 OT Individual Time Calculation (min): 43 min  17 minutes missed   Short Term Goals: Week 1:  OT Short Term Goal 1 (Week 1): Pt will perform shower transfer with  mod A overall. OT Short Term Goal 2 (Week 1): Pt will perform LB dresing with mod A overall. OT Short Term Goal 3 (Week 1): Pt will attend to L side during functional task with mod cuing as needed. OT Short Term Goal 4 (Week 1): Pt will maintain standing balance during self care tasks with mod A.  Skilled Therapeutic Interventions/Progress Updates:    Pt greeted in bed with no s/s pain, vcs for keeping eyes open when conversing with therapist. He verbalized that he wanted to brush his teeth, shaking his head when asked if he wanted to get washed up. Mod A for supine<sit on Lt side with max multimodal cuing for initiation, Lt scanning, and sequencing. Max A for reciprocal scooting forward due to motor planning deficits. Mod A for sit<stand with RW when given increased time and multimodal cuing for self organization. Manual facilitation for L UE placement on device handle. Pt then repeated the words "I wanted to tell you..." several times. After many attempts, he was able to convey that he wanted to attempt toileting. Ambulatory transfer completed with Max A for device positioning and turning appropriately as pt initially headed for the wrong doorway, and bumped into several barriers on the way to the bathroom. Very flexed posture, Lt lean, with L LE buckling a few times. After he transferred to the toilet, pt reported that he had voided bladder when he had not. Vcs for staying awake as he was intermittently falling asleep while sitting on the toilet. Max A for donning pants using hemi strategies with tactile cues to involve L LE. Mod A for sit<stand and for stand  pivot<w/c. Positioned him at the sink to work on [employer] [place] with head turns and motor planning during handwashing and face washing. Pt needed step by step sequencing for these tasks, noted he had bouts of nodding off. During face washing, pt unable to be roused from sleep. OT provided cold wash cloth to face and also sternal rub with no change in alertness. Notified RN, who reported pt has not slept at night for the past few days. Discussed with RN allowing pt to sleep for a bit in the w/c before assisting him back to bed. Pt remained in the w/c, deeply asleep, with safety belt fastened and call bell on his Rt side. Time missed due to fatigue.        Therapy Documentation Precautions:  Precautions Precautions: Fall Precaution Comments: Left lateral lean, Left hemianopsia, impulsive Restrictions Weight Bearing Restrictions: No Vital Signs: Therapy Vitals Temp: 98.1 F (36.7 C) Temp Source: Oral Pulse Rate: 88 Resp: 18 BP: 105/68 Patient Position (if appropriate): Sitting Oxygen Therapy SpO2: 100 % O2 Device: Room Air ADL:        Therapy/Group: Individual Therapy  Joe Perry A Joe Perry 06/06/2019, 3:48 PM

## 2019-06-06 NOTE — Progress Notes (Signed)
Physical Therapy Session Note  Patient Details  Name: Joe Perry MRN: [id number] Date of Birth: 06/01/1951  Today's Date: 06/06/2019 PT Individual Time: 1300-1415 PT Individual Time Calculation (min): 75 min   Short Term Goals: Week 1:  PT Short Term Goal 1 (Week 1): Pt will ambulate 75 feet with LRAD and minA. PT Short Term Goal 2 (Week 1): Pt will complete 4 steps with minA. PT Short Term Goal 3 (Week 1): Pt will perform sit to stand with minA.  Skilled Therapeutic Interventions/Progress Updates:  Pt sitting upright in wheelchair upon arrival for therapy. Pt agreeable to treatment. Pt transported in wheelchair to therapy gym. Pt ambulated 20 feet with RW and minA requiring verbal cueing for task especially turning around and increased step width. Then pt completed walking in the parallel bars with bilateral hand support and minA. Pt cued to keep feet apart and on their respective sides of the black taped line. Pt corrected this a few times and then continued walking with decreased step width. Then pt transported into the hallway. Pt ambulated 16 feetx2 with RW and minA both fwd and back with green line as a visual target to increase pt's step width. Then pt completed lateral side stepping 30 feetx2 with bilateral UE support on railing in the hallway. Pt required max verbal cueing for these tasks, technique and required facilitation at the hips to keep facing the wall. Pt trialed both quad cane and hemiwalker during gait of 20feetx2 but was uncoordinated with those and will still ambulate with RW. Then, pt ambulated 40 feet back to his room with RW and minA. Pt required verbal cueing for directions and task and tactile cueing for upright trunk posture/control. Pt had to be redirected a few times as pt tried to walk into the wall. Throughout the session, the pt was unaware of his L side. During multiple trials of gait throughout session, goal was to correct midline orientation as pt has strong L  lateral lean. Pt left sitting upright in wheelchair, safety belt fastened, call bell within reach, and all needs met at this time.     Therapy Documentation Precautions:  Precautions Precautions: Fall Precaution Comments: Left lateral lean, Left hemianopsia, impulsive Restrictions Weight Bearing Restrictions: No   Pain: denies pain    Therapy/Group: Individual Therapy   , SPT  06/06/2019, 2:25 PM  

## 2019-06-06 NOTE — Progress Notes (Signed)
Social Work Assessment and Plan   Patient Details  Name: Joe Perry MRN: [id number] Date of Birth: Feb 16, 1951  Today's Date: 06/06/2019  Problem List:  Patient Active Problem List   Diagnosis Date Noted  . 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 (Joe Perry) 06/03/2019  . 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 Dyslipidemia, goal LDL below 70 03/12/2019  . Dysphagia due to recent stroke 03/12/2019  . Overweight 03/12/2019  . Stroke (cerebrum) (Joe Perry) - R PCA s/p mechanical thrombectomy, d/t large vessel dz 03/10/2019  . 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 Abnormality of gait   . Spasticity   . Gait disturbance, post-stroke   . Cerebrovascular accident (CVA) due to occlusion of vertebral artery (Joe Perry)   . CVA (cerebral infarction) 01/31/2016  . Type 2 diabetes mellitus without complication, without long-term current use of insulin (Joe Perry) 01/31/2016  . Essential hypertension 01/31/2016  . History of CVA (cerebrovascular accident) 01/31/2016  . 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 Diabetes mellitus without complication (Joe Perry)   . Hypertension   . Stroke Joe Perry)    Past Surgical History:  Past Surgical History:  Procedure Laterality Date  . IR ANGIO INTRA EXTRACRAN SEL COM CAROTID INNOMINATE UNI L MOD SED  03/10/2019  . IR ANGIO VERTEBRAL SEL SUBCLAVIAN INNOMINATE BILAT MOD SED  03/10/2019  . IR CT HEAD LTD  03/10/2019  . IR PERCUTANEOUS ART THROMBECTOMY/INFUSION INTRACRANIAL INC DIAG ANGIO  03/10/2019  . RADIOLOGY WITH ANESTHESIA N/A 03/10/2019   Procedure: IR WITH ANESTHESIA/CODE STROKE;  Surgeon: Radiologist, Medication, MD;  Location: [facility];  Service: Radiology;  Laterality: N/A;   Social History:  reports that he has never smoked. He has never used smokeless tobacco. He reports that he does not  drink alcohol or use drugs.  Family / Support Systems Marital Status: Married Patient Roles: Spouse Spouse/Significant Other: Joe Perry - wife - [phone] Anticipated Caregiver: wife and maybe other family and friends Ability/Limitations of Caregiver: Min A Caregiver Availability: 24/7 Family Dynamics: supporitve wife  Social History Preferred language: English Religion: Christian Cultural Background: Pt/wife are from [place]. Education: college Read: Yes Write: Yes Employment Status: Employed Name of Employer: [employer] Return to Work Plans: pt would like to return to work, if he is able to at some point Public relations account executive Issues: none reported Guardian/Conservator: MD has determined that pt is not yet capable of making his own decisions.  Wife is next of kin for decision making.   Abuse/Neglect Abuse/Neglect Assessment Can Be Completed: Unable to assess, patient is non-responsive or altered mental status(Pt could not really communicate fully with CSW for this assessment to occur.)  Emotional Status Pt's affect, behavior and adjustment status: Pt seems frustrated when he's trying to communicate.  This was not fully assessed, as a result. Recent Psychosocial Issues: Pt was working. Psychiatric History: none reported Substance Abuse History: none reported  Patient / Family Perceptions, Expectations & Goals Pt/Family understanding of illness & functional limitations: Pt's wife seems to have a good understanding of pt's condition and limitations. 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 Anticipated changes in roles/activities/participation: Pt wants to resume activities as he is able. Pt/family expectations/goals: Pt's wife wants pt to get stronger and be able to come home.  [employer]: None  Premorbid Home Care/DME Agencies:  None Transportation available at discharge: wife Resource referrals recommended: Neuropsychology, Support group (specify)(stroke support group)  Discharge Planning Living Arrangements: Spouse/significant other Support Systems: Spouse/significant other, Other relatives, Friends/neighbors Type of Residence: Private residence Insurance Resources: Self-pay(Pt has applied from [employer] Financial Resources: Employment Financial Screen Referred: Previously completed Money Management: Patient Does the patient have any problems obtaining your medications?: Yes (Describe)(Pt has to pay for his medications out of pocket due to no insurance coverage.) Home Management: Pt/wife shared these responsibilities PTA. Patient/Family Preliminary Plans: Pt's wife plans to care for pt at home.  She may have some help from family/friends. Social Work Anticipated Follow Up Needs: HH/OP, Support Group(stroke support group) Expected length of stay: 2 to 2 1/2 weeks  Clinical Impression CSW met with pt and then spoke with pt's wife via telephone to introduce self and role of CSW, as well as to complete assessment.  Pt's wife is very distressed about pt having had a stroke, being in the hospital, and with her trying to pay bills, something pt has always done.  Explained that this CSW would be turning pt's case over to Joe Perry, CSW.  Wife expressed understanding and this CSW offered support.  CSW could not fully complete assessment with pt, as he was having difficulty communicating with CSW.  CSW will continue to follow and assist as needed.  Joe Perry, Joe Perry 06/06/2019, 10:12 PM

## 2019-06-06 NOTE — Progress Notes (Signed)
Social Work Patient ID: Joe Perry, male   DOB: 1950/10/23, 68 y.o.   MRN: [id number]   CSW met with pt and spoke with wife via telephone to update them on team conference discussion and targeted d/c date of 06-18-19.  Pt's wife is completing a program of some kind (phone was breaking up) and she is hoping to finish it before pt comes home.  Wife will have some help from family/friends, but she will be main caregiver.

## 2019-06-06 NOTE — Patient Care Conference (Signed)
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 Dyslipidemia, goal LDL below 70 03/12/2019  . Dysphagia due to recent stroke 03/12/2019  . Overweight 03/12/2019  . 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 Abnormality of gait   . Spasticity   . Gait disturbance, post-stroke   . Cerebrovascular accident (CVA) due to occlusion of vertebral artery ([name])   . CVA (cerebral infarction) 01/31/2016  . Type 2 diabetes mellitus without complication, without long-term current use of insulin ([name]) 01/31/2016  . Essential hypertension 01/31/2016  . History of CVA (cerebrovascular accident) 01/31/2016  . 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    Expected Discharge Date: Expected Discharge Date: 06/18/19  Team Members Present: Physician leading conference: Dr. Alysia Penna Social Worker Present: Alfonse Alpers, LCSW Nurse Present: Isla Pence, RN PT Present: Barrie Folk, PT;Rosita Dechalus, PTA OT Present: Darleen Crocker, OT SLP Present: Stormy Fabian, SLP PPS Coordinator present : Gunnar Fusi, SLP     Current Status/Progress Goal Weekly Team Focus  Bowel/Bladder   Patient is incontinent of both bladder/bowel, unable to inform staff of occurrence,   Continence  QS/PRN assessment and rounding Q2 H for incontinent care   Swallow/Nutrition/ Hydration   Min A for rate and bolus size when consuming dysphagia 1 with thin liquids  Supervision with least restrictive diet  trials of graham crackers   ADL's   mod - max A overall  min A overall  balance, ADL retraining, endruance, strength, functional transfers, pt/family edu   Mobility   mod assist with gait. min assist transfers. min-supervision assist bed mobility  min-CGA overall with LRAD  improved awareness, safety with gait and transfers, visual scanning   Communication   Max A for communicating wants and needs  Min A for wants/needs  yes/no questions, following simple directions   Safety/Cognition/ Behavioral Observations  able to self-feed and sustain attention to basic task  Min A for basic  basic problem solving, sustained attention   Pain   Patient denies pain at this time     Monitor and assess QS/PRN for signs or verbalization of pain   Skin   No skin issues  Maintain skin integrity  QS/PRN assessment and monitoring    Rehab Goals Patient on target to meet rehab goals: Yes Rehab Goals Revised: none - pt's first conference at time of evaluations *See Care Plan and progress notes for long and short-term goals.     Barriers to Discharge  Current Status/Progress Possible Resolutions Date Resolved   Nursing                  PT  Inaccessible home environment;Medical stability;Decreased caregiver support;Home environment access/layout                 OT  Decreased caregiver support;Behavior                SLP                SW                Discharge Planning/Teaching Needs:  Pt to return to his home where his wife will care for him.  Unsure if other friends/family will assist.  CSW to confirm.  Family education to be offered closer to pt's d/c.   Team Discussion:  Pt with uncontrolled HTN and had poor medication compliance PTA.  Pt with previous CVA in 2017.  He is  alert and oriented to person and place.  Pt's speech is slurred and he has delayed responses.  He is incontinent x2.  Pt was able to follow commands and he has a Corporate investment banker.  Pt is min A for bed mobility and mod A for txs and ambulation.  Pt is impulsive.  Pt is total A for communication, is on D1 diet, but he did well with graham crackers.  Pt's speech fluctuates.  Team is unsure what wife can do as far as care.  CSW to confirm with her.  Revisions to Treatment Plan:  none    Medical Summary Current Status: dysarthria, fluctuating orientation, balance not much different than last month Weekly Focus/Goal: initial evals in progress  Barriers to Discharge: Medical stability   Possible Resolutions to Barriers: initiate program determine level of care that wife can provide   Continued Need for Acute Rehabilitation Level of Care: The patient requires daily medical management by a physician with specialized training in physical medicine and rehabilitation for the following reasons: Direction of a multidisciplinary physical rehabilitation program to maximize functional independence : Yes Medical management of patient stability for increased activity during participation in an intensive rehabilitation regime.: Yes Analysis of laboratory values and/or radiology reports with any subsequent need for medication adjustment and/or medical intervention. : Yes   I attest that I was present, lead the team conference, and concur with the assessment and plan of the team.Team conference was held via web/ teleconference due to [place] - 19.   Frady Taddeo, Silvestre Mesi 06/06/2019, 1:54 PM

## 2019-06-07 ENCOUNTER — Inpatient Hospital Stay (HOSPITAL_COMMUNITY): Payer: Self-pay | Admitting: Physical Therapy

## 2019-06-07 ENCOUNTER — Inpatient Hospital Stay (HOSPITAL_COMMUNITY): Payer: Self-pay | Admitting: Occupational Therapy

## 2019-06-07 ENCOUNTER — Inpatient Hospital Stay (HOSPITAL_COMMUNITY): Payer: Self-pay | Admitting: Speech Pathology

## 2019-06-07 LAB — GLUCOSE, CAPILLARY
Glucose-Capillary: 93 mg/dL (ref 70–99)
Glucose-Capillary: 113 mg/dL — ABNORMAL HIGH (ref 70–99)
Glucose-Capillary: 179 mg/dL — ABNORMAL HIGH (ref 70–99)
Glucose-Capillary: 106 mg/dL — ABNORMAL HIGH (ref 70–99)

## 2019-06-07 MED ORDER — OXYMETAZOLINE HCL 0.05 % NA SOLN
2.0000 | Freq: Two times a day (BID) | NASAL | Status: DC | PRN
  Administered 2019-06-08: 2 via NASAL
  Filled 2019-06-07: qty 30

## 2019-06-07 NOTE — Progress Notes (Signed)
Occupational Therapy Session Note  Patient Details  Name: Joe Perry MRN: [id number] Date of Birth: [birth year]  Today's Date: 06/07/2019 OT Individual Time: 1400-1430 OT Individual Time Calculation (min): 30 min    Short Term Goals: Week 1:  OT Short Term Goal 1 (Week 1): Pt will perform shower transfer with  mod A overall. OT Short Term Goal 2 (Week 1): Pt will perform LB dresing with mod A overall. OT Short Term Goal 3 (Week 1): Pt will attend to L side during functional task with mod cuing as needed. OT Short Term Goal 4 (Week 1): Pt will maintain standing balance during self care tasks with mod A.  Skilled Therapeutic Interventions/Progress Updates:    Patient in bed, alert and agreeable to attend therapy.  Supine to SSP with min A.  SPT bed to w/c with CGA.  Min A to wash hands at sink.  Completed dynavision activity in seated position x3 trials - max cues to attend and locate red buttons on left side - 12 second reaction time with max cues.  He is able to reach 90% of board with bilateral UEs.  Completed standing bean bag toss with 100% accuracy using right hand to toss 8 bean bags into container placed at midline.  He remained seated in w/c at end of session with cancellation task and marker provided on bedside table, seat belt alarm set and call bell in reach.  Therapy Documentation Precautions:  Precautions Precautions: Fall Precaution Comments: Left lateral lean, Left hemianopsia, impulsive Restrictions Weight Bearing Restrictions: No General:   Vital Signs:   Pain: Pain Assessment Pain Scale: 0-10 Pain Score: 0-No pain   Other Treatments:     Therapy/Group: Individual Therapy  Carlos Levering 06/07/2019, 3:34 PM

## 2019-06-07 NOTE — Plan of Care (Signed)
  Problem: Consults Goal: RH STROKE PATIENT EDUCATION Description: See Patient Education module for education specifics  06/07/2019 1042 by Sandrea Hammond, LPN Outcome: Progressing 06/07/2019 1037 by Sandrea Hammond, LPN Outcome: Progressing   Problem: RH BOWEL ELIMINATION Goal: RH STG MANAGE BOWEL WITH ASSISTANCE Description: STG Manage Bowel with min Assistance. 06/07/2019 1042 by Sandrea Hammond, LPN Outcome: Progressing 06/07/2019 1037 by Sandrea Hammond, LPN Outcome: Progressing   Problem: RH BLADDER ELIMINATION Goal: RH STG MANAGE BLADDER WITH ASSISTANCE Description: STG Manage Bladder With min Assistance 06/07/2019 1042 by Sandrea Hammond, LPN Outcome: Progressing 06/07/2019 1037 by Sandrea Hammond, LPN Outcome: Progressing   Problem: RH SAFETY Goal: RH STG ADHERE TO SAFETY PRECAUTIONS W/ASSISTANCE/DEVICE Description: STG Adhere to Safety Precautions With  min Assistance/Device. 06/07/2019 1042 by Sandrea Hammond, LPN Outcome: Progressing 06/07/2019 1037 by Sandrea Hammond, LPN Outcome: Progressing   Problem: RH KNOWLEDGE DEFICIT Goal: RH STG INCREASE KNOWLEDGE OF DIABETES Description: Wife will be able to manage DM using medication, dietary restrictions and monitoring CBGs using handouts and educational resources independently 06/07/2019 1042 by Sandrea Hammond, LPN Outcome: Progressing 06/07/2019 1037 by Sandrea Hammond, LPN Outcome: Progressing Goal: RH STG INCREASE KNOWLEDGE OF HYPERTENSION Description: Wife will be able to manage HTN using medication, dietary restrictions and monitoring BP using handouts and educational resources independently 06/07/2019 1042 by Sandrea Hammond, LPN Outcome: Progressing 06/07/2019 1037 by Sandrea Hammond, LPN Outcome: Progressing Goal: RH STG INCREASE KNOWLEDGE OF DYSPHAGIA/FLUID INTAKE Description: Wife will be able to manage dysphagia , dietary restrictions  using handouts and educational resources independently 06/07/2019 1042 by Sandrea Hammond,  LPN Outcome: Progressing 06/07/2019 1037 by Sandrea Hammond, LPN Outcome: Progressing Goal: RH STG INCREASE KNOWLEGDE OF HYPERLIPIDEMIA Description: Wife will be able to manage HLD using medication, dietary restrictions  using handouts and educational resources independently 06/07/2019 1042 by Sandrea Hammond, LPN Outcome: Progressing 06/07/2019 1037 by Sandrea Hammond, LPN Outcome: Progressing Goal: RH STG INCREASE KNOWLEDGE OF STROKE PROPHYLAXIS Description: Wife will be able state understanding of secondary stroke prevention using handouts and educational resources independently 06/07/2019 1042 by Sandrea Hammond, LPN Outcome: Progressing 06/07/2019 1037 by Sandrea Hammond, LPN Outcome: Progressing

## 2019-06-07 NOTE — Progress Notes (Signed)
Speech Language Pathology Daily Session Note  Patient Details  Name: Joe Perry MRN: [id number] Date of Birth: Jul 24, 1951  Today's Date: 06/07/2019 SLP Individual Time: 1520-1600 SLP Individual Time Calculation (min): 40 min  Short Term Goals: Week 1: SLP Short Term Goal 1 (Week 1): Pt will utlize multi-modal communication to indicate wants and needs with Mod A cues. SLP Short Term Goal 2 (Week 1): Pt will follow 2 step directions related to basic ADL in 7 out of 10 opportunities with Mod A cues. SLP Short Term Goal 3 (Week 1): Pt will complete familiar problem solving tasks related to ADLs with Mod A cues. SLP Short Term Goal 4 (Week 1): Pt will consume trials of dysphagia 2 diet with no overt s/s of aspiration/dysphagia over 3 sessions prior to upgraded with Min A cues for compensatory swallow strategies.  Skilled Therapeutic Interventions:  Pt was seen for skilled ST targeting communication goals.  Pt matched word to object from a field of two with >50% accuracy when given mod assist multimodal cues to recognize and correct errors.  Pt could then name objects when object was paired with its written word in 100% of opportunities with mod-max assist verbal cues (sentence completion and repetition were most effective techniques for eliciting naming).  Pt indicated that he wanted to get in bed (stated "bed") when given two choices at the end of today's therapy session.  Pt was left in bed with bed alarm set and call bell within reach.  Continue per current plan of care.    Pain Pain Assessment Pain Scale: 0-10 Pain Score: 0-No pain  Therapy/Group: Individual Therapy  Keosha Rossa, Selinda Orion 06/07/2019, 5:04 PM

## 2019-06-07 NOTE — Progress Notes (Signed)
[facility] PHYSICAL MEDICINE & REHABILITATION PROGRESS NOTE   Subjective/Complaints:  Pt says he's sleeping and eating- denies pain.  Per staff, he's having intermittent nose bleeds- will add Afrin prn x 3 days for epistaxis.   Review of systems negative for chest pain shortness of breath nausea vomiting diarrhea constipation   Objective:   No results found. No results for input(s): WBC, HGB, HCT, PLT in the last 72 hours. No results for input(s): NA, K, CL, CO2, GLUCOSE, BUN, CREATININE, CALCIUM in the last 72 hours.  Intake/Output Summary (Last 24 hours) at 06/07/2019 1455 Last data filed at 06/07/2019 0505 Gross per 24 hour  Intake 120 ml  Output 150 ml  Net -30 ml     Physical Exam: Vital Signs Blood pressure 137/80, pulse 74, temperature 98.1 F (36.7 C), resp. rate 17, SpO2 100 %.    General: No acute distress; sitting up in manual w/c, in room; flat affect, NAD Mood and affect are appropriate, but very flat facies/affect Heart: Regular rate and rhythm  Lungs: Clear to auscultation, breathing unlabored, no rales or wheezes Abdomen: Positive bowel sounds, soft nontender to palpation, nondistended Extremities: No clubbing, cyanosis, or edema Skin: No evidence of breakdown, no evidence of rash Neurologic:  motor strength is 5/5 in Right and 4/5 Left   deltoid, bicep, tricep, grip, hip flexor, knee extensors, ankle dorsiflexor and plantar flexor Sensory exam normal sensation to light touch  in bilateral upper and lower extremities Cerebellar exam normal finger to nose to finger as well as heel to shin in right  upper and lower extremities, Left side moderate dysmetria on LUE and mild LLE  Musculoskeletal: Full range of motion in all 4 extremities. No joint swelling  Assessment/Plan: 1. Functional deficits secondary to RIght PCA infarct with left hemiataxia which require 3+ hours per day of interdisciplinary therapy in a comprehensive inpatient rehab  setting.  Physiatrist is providing close team supervision and 24 hour management of active medical problems listed below.  Physiatrist and rehab team continue to assess barriers to discharge/monitor patient progress toward functional and medical goals  Care Tool:  Bathing    Body parts bathed by patient: Chest, Abdomen, Front perineal area, Right upper leg, Face   Body parts bathed by helper: Right arm, Left arm, Buttocks, Right lower leg, Left lower leg, Left upper leg     Bathing assist Assist Level: Maximal Assistance - Patient 24 - 49%     Upper Body Dressing/Undressing Upper body dressing   What is the patient wearing?: Button up shirt    Upper body assist Assist Level: Total Assistance - Patient < 25%    Lower Body Dressing/Undressing Lower body dressing      What is the patient wearing?: Incontinence brief     Lower body assist Assist for lower body dressing: Moderate Assistance - Patient 50 - 74%     Toileting Toileting    Toileting assist Assist for toileting: Moderate Assistance - Patient 50 - 74%(incont.,encourage to void in urinal)     Transfers Chair/bed transfer  Transfers assist     Chair/bed transfer assist level: Minimal Assistance - Patient > 75%     Locomotion Ambulation   Ambulation assist      Assist level: Minimal Assistance - Patient > 75% Assistive device: Walker-rolling Max distance: 150   Walk 10 feet activity   Assist     Assist level: Minimal Assistance - Patient > 75% Assistive device: Walker-rolling   Walk 50 feet activity  Assist    Assist level: Minimal Assistance - Patient > 75% Assistive device: Walker-rolling    Walk 150 feet activity   Assist Walk 150 feet activity did not occur: Safety/medical concerns  Assist level: Minimal Assistance - Patient > 75% Assistive device: Walker-rolling    Walk 10 feet on uneven surface  activity   Assist Walk 10 feet on uneven surfaces activity did not  occur: Safety/medical concerns         Wheelchair     Assist Will patient use wheelchair at discharge?: Yes Type of Wheelchair: Manual    Wheelchair assist level: Moderate Assistance - Patient 50 - 74% Max wheelchair distance: 75    Wheelchair 50 feet with 2 turns activity    Assist        Assist Level: Moderate Assistance - Patient 50 - 74%   Wheelchair 150 feet activity     Assist  Wheelchair 150 feet activity did not occur: Safety/medical concerns       Blood pressure 137/80, pulse 74, temperature 98.1 F (36.7 C), resp. rate 17, SpO2 100 %.  Medical Problem List and Plan: 1.Left-sided weakness with right gaze preference and left neglectsecondary to right large PCA infarction status post IR revascularization occludedR PCOM and PCAas well as history of CVA in May 2017 and March 2020 -CIR PT, OT             -ELOS 10-14 days, supervision goals 2. Antithrombotics: -DVT/anticoagulation:Lovenox             -check venous dopplers today -antiplatelet therapy: Aspirin 81 mg daily, Brilinta 90 mg twice daily 3. Pain Management:Tylenol as needed 4. Mood:Aricept 10 mg nightly, -antipsychotic agents: seroquel 25mg  tid -try to reduce to limit day time sedation -start sleep chart 5. Neuropsych: This patientis notcapable of making decisions on hisown behalf. 6. Skin/Wound Care:routine care, skin checks. Improve nutrition 7. Fluids/Electrolytes/Nutrition:Routine in and outs with follow-up chemistries 8. Hypertension. Norvasc 5 mg daily. Monitor with increased mobility Vitals:   06/06/19 2156 06/07/19 0504  BP: 120/69 137/80  Pulse: 72 74  Resp: 16 17  Temp: 97.7 F (36.5 C) 98.1 F (36.7 C)  SpO2: 99% 100%   9. Diabetes mellitus. Hemoglobin A1c 9.8.  -continue glucophage 500 mg twice daily. -cbg's ac and hs -titrate regimen as  needed - CBG (last 3)  Recent Labs    06/06/19 2201 06/07/19 0617 06/07/19 1228  GLUCAP 94 93 113*  Controlled 10/2 10. UTI/enterococcusfaecalis. Completed 5-day course of Rocephin 11. Hyperlipidemia. Lipitor 12. Dysphagia. Dysphasia #1 thin liquid. Follow-up speech therapy and advance as tolerated 13. Left upper extremity involuntary movement/hemichorea?EEG negative -appears to be weakened and ataxic on exam although pt was inconsistent with effort--continue to follow 14. Nose bleeds/epistaxis  -will add Afrin prn for nose bleeds- usually can use x 3 days regularly- BP will tolerate.    LOS: 4 days A FACE TO FACE EVALUATION WAS PERFORMED  Darus Hershman 06/07/2019, 2:55 PM

## 2019-06-07 NOTE — Progress Notes (Signed)
Occupational Therapy Session Note  Patient Details  Name: Joe Perry MRN: [id number] Date of Birth: 1950/11/30  Today's Date: 06/07/2019 OT Individual Time: 1000-1040 OT Individual Time Calculation (min): 40 min    Short Term Goals: Week 1:  OT Short Term Goal 1 (Week 1): Pt will perform shower transfer with  mod A overall. OT Short Term Goal 2 (Week 1): Pt will perform LB dresing with mod A overall. OT Short Term Goal 3 (Week 1): Pt will attend to L side during functional task with mod cuing as needed. OT Short Term Goal 4 (Week 1): Pt will maintain standing balance during self care tasks with mod A.  Skilled Therapeutic Interventions/Progress Updates:    Patient seated in w/c, denies pain, flat affect but states that he is feeling okay.  He is cooperative and agreeable to therapy session.  SPT w/c to/from mat with CGA, cues for safety.  Unsupported sitting with CS due to posterior and right lean - completed trunk ROM and reaching activities with good tolerance, ongoing cues for mid line positioning.  Completed design copy task with PVC pipe task - max cues and hand over hand for 50% of activity (he is able to take the pieces apart independently).  Standing balance and weight shift activities x3 with min A for balance with good tolerance.  He returned to w/c at close of session, seat belt alarm set and call bell in reach.    Therapy Documentation Precautions:  Precautions Precautions: Fall Precaution Comments: Left lateral lean, Left hemianopsia, impulsive Restrictions Weight Bearing Restrictions: No General:   Vital Signs:   Pain: Pain Assessment Pain Scale: 0-10 Pain Score: 0-No pain   Therapy/Group: Individual Therapy  Carlos Levering 06/07/2019, 12:21 PM

## 2019-06-07 NOTE — Progress Notes (Signed)
Physical Therapy Session Note  Patient Details  Name: Joe Perry MRN: [id number] Date of Birth: 1950-12-28  Today's Date: 06/07/2019 PT Individual Time: 0805-0906 PT Individual Time Calculation (min): 61 min   Short Term Goals: Week 1:  PT Short Term Goal 1 (Week 1): Pt will ambulate 75 feet with LRAD and minA. PT Short Term Goal 2 (Week 1): Pt will complete 4 steps with minA. PT Short Term Goal 3 (Week 1): Pt will perform sit to stand with minA.  Skilled Therapeutic Interventions/Progress Updates:    Pt received supine in bed with RN present assisting pt with eating breakfast and reports that pt needs anterior peri-care and a new brief once pt is done eating. Therapist assumed care of patient providing full supervision for meal while engaging pt in L attention activities - cuing for small sips when drinking. Pt performed anterior peri-care with set-up assist. Supine>sit with min assist for trunk control. Therapist donned new brief and pants sitting EOB with max assist and supervision for sitting balance. Sit>stand EOB>RW with min assist for lifting into standing and balance due to L lateral trunk lean while finishing donning LB clothing max assist. Stand pivot to w/c using RW with min assist for balance. Sit<>stand w/c<>sink with min assist for lifting and balance upon coming to standing due to continued L lateral trunk lean while pt performed face washing - tolerated standing ~1-2 minutes before requiring seated break. Pt completed oral care sitting at sink due to fatigue. Transported to/from gym in w/c. Ambulated ~134ft using RW with CGA progressed to min assist for balance with cuing for L attention and increased BOS. Pt transported back to room in w/c and left sitting in w/c with needs in reach and seat belt alarm on.  Therapy Documentation Precautions:  Precautions Precautions: Fall Precaution Comments: Left lateral lean, Left hemianopsia, impulsive Restrictions Weight Bearing  Restrictions: No  Pain:   No reports or indications of pain during session.   Therapy/Group: Individual Therapy  Tawana Scale, PT, DPT 06/07/2019, 7:48 AM

## 2019-06-08 ENCOUNTER — Inpatient Hospital Stay (HOSPITAL_COMMUNITY): Payer: Self-pay | Admitting: Speech Pathology

## 2019-06-08 LAB — GLUCOSE, CAPILLARY
Glucose-Capillary: 100 mg/dL — ABNORMAL HIGH (ref 70–99)
Glucose-Capillary: 132 mg/dL — ABNORMAL HIGH (ref 70–99)
Glucose-Capillary: 188 mg/dL — ABNORMAL HIGH (ref 70–99)
Glucose-Capillary: 187 mg/dL — ABNORMAL HIGH (ref 70–99)

## 2019-06-08 NOTE — Progress Notes (Signed)
Speech Language Pathology Daily Session Note  Patient Details  Name: Joe Perry MRN: [id number] Date of Birth: [birth year]  Today's Date: 06/08/2019 SLP Individual Time: [id number] SLP Individual Time Calculation (min): 45 min  Short Term Goals: Week 1: SLP Short Term Goal 1 (Week 1): Pt will utlize multi-modal communication to indicate wants and needs with Mod A cues. SLP Short Term Goal 2 (Week 1): Pt will follow 2 step directions related to basic ADL in 7 out of 10 opportunities with Mod A cues. SLP Short Term Goal 3 (Week 1): Pt will complete familiar problem solving tasks related to ADLs with Mod A cues. SLP Short Term Goal 4 (Week 1): Pt will consume trials of dysphagia 2 diet with no overt s/s of aspiration/dysphagia over 3 sessions prior to upgraded with Min A cues for compensatory swallow strategies.  Skilled Therapeutic Interventions:  Pt was seen for skilled ST targeting goals for dysphagia and communication.  Pt appeared restless in bed upon therapist's arrival and indicated that he wanted to go to the bathroom.  Pt was transferred from bed to wheelchair to commode with mod-max multimodal cues for safety due to impaired sequencing of transfer.  Pt's incontinence brief was wet but he was able to void while on the commode.  Therapist facilitated the session with a trial snack of dys 2 textures to continue working towards diet advancement.  Pt consumed advanced solids with supervision cues for use of swallowing precautions.  The oral phase was efficient for masticating and clearing solids from the oral cavity.  No overt s/s of aspiration were evident with solids or liquids.  I would recommend further trials of advanced solids with SLP given progress noted at bedside.  Pt was able to match object to word from a field of two for >50% accuracy with mod assist verbal cues to recognize and correct errors.  He needed mod-max cues to name objects when provided with the object itself along with  its written name.  Pt indicated that he wanted to stay sitting up in his wheelchair at the end of today's therapy session.  Pt was left in wheelchair with chair alarm set and call bell within reach.  Continue per current plan of care.    Pain Pain Assessment Pain Scale: 0-10 Pain Score: 0-No pain  Therapy/Group: Individual Therapy  Jabril Pursell, Selinda Orion 06/08/2019, 4:18 PM

## 2019-06-08 NOTE — Plan of Care (Signed)
  Problem: Consults Goal: RH STROKE PATIENT EDUCATION Description: See Patient Education module for education specifics  Outcome: Progressing   Problem: RH BOWEL ELIMINATION Goal: RH STG MANAGE BOWEL WITH ASSISTANCE Description: STG Manage Bowel with min Assistance. Outcome: Progressing   Problem: RH SAFETY Goal: RH STG ADHERE TO SAFETY PRECAUTIONS W/ASSISTANCE/DEVICE Description: STG Adhere to Safety Precautions With  min Assistance/Device. Outcome: Progressing   Problem: RH KNOWLEDGE DEFICIT Goal: RH STG INCREASE KNOWLEDGE OF DIABETES Description: Wife will be able to manage DM using medication, dietary restrictions and monitoring CBGs using handouts and educational resources independently Outcome: Progressing Goal: RH STG INCREASE KNOWLEDGE OF HYPERTENSION Description: Wife will be able to manage HTN using medication, dietary restrictions and monitoring BP using handouts and educational resources independently Outcome: Progressing Goal: RH STG INCREASE KNOWLEDGE OF DYSPHAGIA/FLUID INTAKE Description: Wife will be able to manage dysphagia , dietary restrictions  using handouts and educational resources independently Outcome: Progressing Goal: RH STG INCREASE KNOWLEGDE OF HYPERLIPIDEMIA Description: Wife will be able to manage HLD using medication, dietary restrictions  using handouts and educational resources independently Outcome: Progressing Goal: RH STG INCREASE KNOWLEDGE OF STROKE PROPHYLAXIS Description: Wife will be able state understanding of secondary stroke prevention using handouts and educational resources independently Outcome: Progressing

## 2019-06-08 NOTE — Progress Notes (Signed)
[facility] PHYSICAL MEDICINE & REHABILITATION PROGRESS NOTE   Subjective/Complaints:  Pt reports no issues- sleeping "ok"- denies pain.  Review of systems negative for chest pain shortness of breath nausea vomiting diarrhea constipation- hard to determine due to cognitive issues   Objective:   No results found. No results for input(s): WBC, HGB, HCT, PLT in the last 72 hours. No results for input(s): NA, K, CL, CO2, GLUCOSE, BUN, CREATININE, CALCIUM in the last 72 hours.  Intake/Output Summary (Last 24 hours) at 06/08/2019 1445 Last data filed at 06/08/2019 1330 Gross per 24 hour  Intake 360 ml  Output 300 ml  Net 60 ml     Physical Exam: Vital Signs Blood pressure 137/81, pulse 70, temperature 98 F (36.7 C), resp. rate 17, SpO2 100 %.   Reviewed vitals General: No acute distress; sitting up in manual w/c, in room; flat affect, NAD Mood and affect are appropriate, but very flat facies/affect Heart: Regular rate and rhythm  Lungs: Clear to auscultation, breathing unlabored, no rales or wheezes Abdomen: Positive bowel sounds, soft nontender to palpation, nondistended Extremities: No clubbing, cyanosis, or edema Skin: No evidence of breakdown, no evidence of rash Neurologic:  motor strength is 5/5 in Right and 4/5 Left   deltoid, bicep, tricep, grip, hip flexor, knee extensors, ankle dorsiflexor and plantar flexor Sensory exam normal sensation to light touch  in bilateral upper and lower extremities Cerebellar exam normal finger to nose to finger as well as heel to shin in right  upper and lower extremities, Left side moderate dysmetria on LUE and mild LLE  Musculoskeletal: Full range of motion in all 4 extremities. No joint swelling  Assessment/Plan: 1. Functional deficits secondary to RIght PCA infarct with left hemiataxia which require 3+ hours per day of interdisciplinary therapy in a comprehensive inpatient rehab setting.  Physiatrist is providing close team  supervision and 24 hour management of active medical problems listed below.  Physiatrist and rehab team continue to assess barriers to discharge/monitor patient progress toward functional and medical goals  Care Tool:  Bathing    Body parts bathed by patient: Chest, Abdomen, Front perineal area, Right upper leg, Face   Body parts bathed by helper: Right arm, Left arm, Buttocks, Right lower leg, Left lower leg, Left upper leg     Bathing assist Assist Level: Maximal Assistance - Patient 24 - 49%     Upper Body Dressing/Undressing Upper body dressing   What is the patient wearing?: Pull over shirt    Upper body assist Assist Level: Total Assistance - Patient < 25%    Lower Body Dressing/Undressing Lower body dressing      What is the patient wearing?: Incontinence brief     Lower body assist Assist for lower body dressing: Moderate Assistance - Patient 50 - 74%     Toileting Toileting    Toileting assist Assist for toileting: Moderate Assistance - Patient 50 - 74%     Transfers Chair/bed transfer  Transfers assist     Chair/bed transfer assist level: Minimal Assistance - Patient > 75%     Locomotion Ambulation   Ambulation assist      Assist level: Minimal Assistance - Patient > 75% Assistive device: Walker-rolling Max distance: 174ft   Walk 10 feet activity   Assist     Assist level: Minimal Assistance - Patient > 75% Assistive device: Walker-rolling   Walk 50 feet activity   Assist    Assist level: Minimal Assistance - Patient > 75% Assistive  device: Walker-rolling    Walk 150 feet activity   Assist Walk 150 feet activity did not occur: Safety/medical concerns  Assist level: Minimal Assistance - Patient > 75% Assistive device: Walker-rolling    Walk 10 feet on uneven surface  activity   Assist Walk 10 feet on uneven surfaces activity did not occur: Safety/medical concerns         Wheelchair     Assist Will patient  use wheelchair at discharge?: Yes Type of Wheelchair: Manual    Wheelchair assist level: Moderate Assistance - Patient 50 - 74% Max wheelchair distance: 75    Wheelchair 50 feet with 2 turns activity    Assist        Assist Level: Moderate Assistance - Patient 50 - 74%   Wheelchair 150 feet activity     Assist  Wheelchair 150 feet activity did not occur: Safety/medical concerns       Blood pressure 137/81, pulse 70, temperature 98 F (36.7 C), resp. rate 17, SpO2 100 %.  Medical Problem List and Plan: 1.Left-sided weakness with right gaze preference and left neglectsecondary to right large PCA infarction status post IR revascularization occludedR PCOM and PCAas well as history of CVA in May 2017 and March 2020 -CIR PT, OT             -ELOS 10-14 days, supervision goals 2. Antithrombotics: -DVT/anticoagulation:Lovenox             -check venous dopplers today -antiplatelet therapy: Aspirin 81 mg daily, Brilinta 90 mg twice daily 3. Pain Management:Tylenol as needed 4. Mood:Aricept 10 mg nightly, -antipsychotic agents: seroquel 25mg  tid -try to reduce to limit day time sedation -start sleep chart 5. Neuropsych: This patientis notcapable of making decisions on hisown behalf. 6. Skin/Wound Care:routine care, skin checks. Improve nutrition 7. Fluids/Electrolytes/Nutrition:Routine in and outs with follow-up chemistries 8. Hypertension. Norvasc 5 mg daily. Monitor with increased mobility Vitals:   06/07/19 2000 06/08/19 0447  BP: 122/83 137/81  Pulse: 84 70  Resp: 18 17  Temp: 98.4 F (36.9 C) 98 F (36.7 C)  SpO2: 99% 100%   9. Diabetes mellitus. Hemoglobin A1c 9.8.  -continue glucophage 500 mg twice daily. -cbg's ac and hs -titrate regimen as needed - CBG (last 3)  Recent Labs    06/07/19 2108 06/08/19 0613  06/08/19 1156  GLUCAP 106* 100* 132*  Controlled 10/4 10. UTI/enterococcusfaecalis. Completed 5-day course of Rocephin 11. Hyperlipidemia. Lipitor 12. Dysphagia. Dysphasia #1 thin liquid. Follow-up speech therapy and advance as tolerated 13. Left upper extremity involuntary movement/hemichorea?EEG negative -appears to be weakened and ataxic on exam although pt was inconsistent with effort--continue to follow 14. Nose bleeds/epistaxis  -will add Afrin prn for nose bleeds- usually can use x 3 days regularly- BP will tolerate.    LOS: 5 days A FACE TO FACE EVALUATION WAS PERFORMED  Joe Perry 06/08/2019, 2:45 PM

## 2019-06-09 ENCOUNTER — Inpatient Hospital Stay (HOSPITAL_COMMUNITY): Payer: Self-pay | Admitting: Occupational Therapy

## 2019-06-09 ENCOUNTER — Inpatient Hospital Stay (HOSPITAL_COMMUNITY): Payer: Self-pay | Admitting: Physical Therapy

## 2019-06-09 ENCOUNTER — Inpatient Hospital Stay (HOSPITAL_COMMUNITY): Payer: Self-pay

## 2019-06-09 LAB — GLUCOSE, CAPILLARY
Glucose-Capillary: 102 mg/dL — ABNORMAL HIGH (ref 70–99)
Glucose-Capillary: 149 mg/dL — ABNORMAL HIGH (ref 70–99)
Glucose-Capillary: 115 mg/dL — ABNORMAL HIGH (ref 70–99)
Glucose-Capillary: 151 mg/dL — ABNORMAL HIGH (ref 70–99)

## 2019-06-09 NOTE — Progress Notes (Signed)
Physical Therapy Session Note  Patient Details  Name: Joe Perry MRN: [id number] Date of Birth: August 08, 1951  Today's Date: 06/09/2019 PT Individual Time: 0805-0902 PT Individual Time Calculation (min): 57 min   Short Term Goals: Week 1:  PT Short Term Goal 1 (Week 1): Pt will ambulate 75 feet with LRAD and minA. PT Short Term Goal 2 (Week 1): Pt will complete 4 steps with minA. PT Short Term Goal 3 (Week 1): Pt will perform sit to stand with minA.  Skilled Therapeutic Interventions/Progress Updates: Pt presented in bed with NT supervising breakfast. PTA took over supervision duties with PTA providing cues for smaller sips and to swallow drink before taking another sip as observed drink leaking from lips.  Once completed meal pt PTA provided maxA for threading new brief and pants for time management. Performed STS minA and PTA provided maxA for LB clothing management with PTA providing verbal cues for correcting L lateral lean. Pt then performed stand pivot to w/c minA. Pt transported to ortho gym and participated in [employer] mode A for standing tolerance and L scanning. Pt required max cues for initiation and to scan to L with pt unable to find red light within 60 seconds on first trial. On second trial pt was able to locate red light and follow through task. Pt with LUQ reaction time of 7.93 and LLQ reaction time of 5.86. Pt returned to room at end of session and remained in w/c with belt alarm on, call bell within reach and current needs met.      Therapy Documentation Precautions:  Precautions Precautions: Fall Precaution Comments: Left lateral lean, Left hemianopsia, impulsive Restrictions Weight Bearing Restrictions: No   Therapy/Group: Individual Therapy  Ceferino Lang  Serjio Deupree, PTA  06/09/2019, 1:07 PM

## 2019-06-09 NOTE — Progress Notes (Signed)
[facility] PHYSICAL MEDICINE & REHABILITATION PROGRESS NOTE   Subjective/Complaints:  Remains confused, trying to get OOB, had brief off then urinated   Review of systems negative for chest pain shortness of breath nausea vomiting diarrhea constipation- hard to determine due to cognitive issues   Objective:   No results found. No results for input(s): WBC, HGB, HCT, PLT in the last 72 hours. No results for input(s): NA, K, CL, CO2, GLUCOSE, BUN, CREATININE, CALCIUM in the last 72 hours.  Intake/Output Summary (Last 24 hours) at 06/09/2019 0753 Last data filed at 06/08/2019 1849 Gross per 24 hour  Intake 720 ml  Output -  Net 720 ml     Physical Exam: Vital Signs Blood pressure 100/66, pulse 89, temperature 98.5 F (36.9 C), temperature source Oral, resp. rate 18, SpO2 100 %.   Reviewed vitals General: No acute distress; sitting up in manual w/c, in room; flat affect, NAD Mood and affect are appropriate, but very flat facies/affect Heart: Regular rate and rhythm  Lungs: Clear to auscultation, breathing unlabored, no rales or wheezes Abdomen: Positive bowel sounds, soft nontender to palpation, nondistended Extremities: No clubbing, cyanosis, or edema Skin: No evidence of breakdown, no evidence of rash Neurologic:  motor strength is 5/5 in Right and 4/5 Left   deltoid, bicep, tricep, grip, hip flexor, knee extensors, ankle dorsiflexor and plantar flexor Sensory exam normal sensation to light touch  in bilateral upper and lower extremities Cerebellar exam normal finger to nose to finger as well as heel to shin in right  upper and lower extremities, Left side mild/moderate dysmetria on LUE and mild LLE  Musculoskeletal: Full range of motion in all 4 extremities. No joint swelling  Assessment/Plan: 1. Functional deficits secondary to RIght PCA infarct with left hemiataxia which require 3+ hours per day of interdisciplinary therapy in a comprehensive inpatient rehab  setting.  Physiatrist is providing close team supervision and 24 hour management of active medical problems listed below.  Physiatrist and rehab team continue to assess barriers to discharge/monitor patient progress toward functional and medical goals  Care Tool:  Bathing    Body parts bathed by patient: Chest, Abdomen, Front perineal area, Right upper leg, Face   Body parts bathed by helper: Right arm, Left arm, Buttocks, Right lower leg, Left lower leg, Left upper leg     Bathing assist Assist Level: Maximal Assistance - Patient 24 - 49%     Upper Body Dressing/Undressing Upper body dressing   What is the patient wearing?: Pull over shirt    Upper body assist Assist Level: Total Assistance - Patient < 25%    Lower Body Dressing/Undressing Lower body dressing      What is the patient wearing?: Incontinence brief     Lower body assist Assist for lower body dressing: Moderate Assistance - Patient 50 - 74%     Toileting Toileting    Toileting assist Assist for toileting: Moderate Assistance - Patient 50 - 74%     Transfers Chair/bed transfer  Transfers assist     Chair/bed transfer assist level: Minimal Assistance - Patient > 75%     Locomotion Ambulation   Ambulation assist      Assist level: Minimal Assistance - Patient > 75% Assistive device: Walker-rolling Max distance: 183ft   Walk 10 feet activity   Assist     Assist level: Minimal Assistance - Patient > 75% Assistive device: Walker-rolling   Walk 50 feet activity   Assist    Assist level: Minimal  Assistance - Patient > 75% Assistive device: Walker-rolling    Walk 150 feet activity   Assist Walk 150 feet activity did not occur: Safety/medical concerns  Assist level: Minimal Assistance - Patient > 75% Assistive device: Walker-rolling    Walk 10 feet on uneven surface  activity   Assist Walk 10 feet on uneven surfaces activity did not occur: Safety/medical concerns          Wheelchair     Assist Will patient use wheelchair at discharge?: Yes Type of Wheelchair: Manual    Wheelchair assist level: Moderate Assistance - Patient 50 - 74% Max wheelchair distance: 75    Wheelchair 50 feet with 2 turns activity    Assist        Assist Level: Moderate Assistance - Patient 50 - 74%   Wheelchair 150 feet activity     Assist  Wheelchair 150 feet activity did not occur: Safety/medical concerns       Blood pressure 100/66, pulse 89, temperature 98.5 F (36.9 C), temperature source Oral, resp. rate 18, SpO2 100 %.  Medical Problem List and Plan: 1.Left-sided weakness with right gaze preference and left neglectsecondary to right large PCA infarction status post IR revascularization occludedR PCOM and PCAas well as history of CVA in May 2017 and March 2020 -CIR PT, OT             -ELOS 10-14 days, supervision goals 2. Antithrombotics: -DVT/anticoagulation:Lovenox             -check venous dopplers today -antiplatelet therapy: Aspirin 81 mg daily, Brilinta 90 mg twice daily 3. Pain Management:Tylenol as needed 4. Mood:Aricept 10 mg nightly, -antipsychotic agents: seroquel 25mg  tid -try to reduce to limit day time sedation -start sleep chart 5. Neuropsych: This patientis notcapable of making decisions on hisown behalf. 6. Skin/Wound Care:routine care, skin checks. Improve nutrition 7. Fluids/Electrolytes/Nutrition:Routine in and outs with follow-up chemistries 8. Hypertension. Norvasc 5 mg daily. Monitor with increased mobility Vitals:   06/08/19 1520 06/08/19 1935  BP: 123/70 100/66  Pulse: 84 89  Resp: 18 18  Temp: 98.3 F (36.8 C) 98.5 F (36.9 C)  SpO2: 100% 100%   9. Diabetes mellitus. Hemoglobin A1c 9.8.  -continue glucophage 500 mg twice daily. -cbg's ac and hs -titrate regimen as  needed - CBG (last 3)  Recent Labs    06/08/19 1636 06/08/19 2102 06/09/19 0607  GLUCAP 188* 187* 102*  Controlled 10/5 10. UTI/enterococcusfaecalis. Completed 5-day course of Rocephin 11. Hyperlipidemia. Lipitor 12. Dysphagia. Dysphasia #1 thin liquid. Follow-up speech therapy and advance as tolerated 13. Left upper extremity involuntary movement/hemichorea?EEG negative -appears to be weakened and ataxic on exam although pt was inconsistent with effort--continue to follow 14. Nose bleeds/epistaxis  -will add Afrin prn for nose bleeds- usually can use x 3 days regularly- BP will tolerate.    LOS: 6 days A FACE TO FACE EVALUATION WAS PERFORMED  Charlett Blake 06/09/2019, 7:53 AM

## 2019-06-09 NOTE — Progress Notes (Signed)
Speech Language Pathology Daily Session Note  Patient Details  Name: Joe Perry MRN: [id number] Date of Birth: Dec 23, 1950  Today's Date: 06/09/2019 SLP Individual Time: 1302-1400 SLP Individual Time Calculation (min): 58 min  Short Term Goals: Week 1: SLP Short Term Goal 1 (Week 1): Pt will utlize multi-modal communication to indicate wants and needs with Mod A cues. SLP Short Term Goal 2 (Week 1): Pt will follow 2 step directions related to basic ADL in 7 out of 10 opportunities with Mod A cues. SLP Short Term Goal 3 (Week 1): Pt will complete familiar problem solving tasks related to ADLs with Mod A cues. SLP Short Term Goal 4 (Week 1): Pt will consume trials of dysphagia 2 diet with no overt s/s of aspiration/dysphagia over 3 sessions prior to upgraded with Min A cues for compensatory swallow strategies.  Skilled Therapeutic Interventions: Skilled ST services focused on language and swallow skills. Pt demonstrated greeting with gestures only. Pt was consuming dys 1 and thin lunch tray with NT upon entering room, NT reporting supervision A mainly requiring tray set up. SLP facilitated PO consumption of remaining dys 1 textures pt required supervision A to hold bowl in place and required max A verbal cues to locate 2/3 objects left of midline on tray. SLP returned with dys 2 snack, pt demonstrated appropriate exchange in greetings, ability to count 1-10 and name months of the year with initial verbal cue. SLP also facilitated PO consumption of dys 3 trials snack, pt required mod A verbal cues for mild oral residue and to clear left buccal pocketing with x2 delayed cough during consumption of thin via cup demonstrating oral holding. Pt demonstrated ability to name 5 out 10 common objects with max A sentence completion and demonstration cues and match word to object in a field of 2 in 2 out 10 opportunities. Pt demonstrated reduced verbal output and sustained attention  (2/3 way through session.)  Pt was left in room with call bell within reach and chair alarm set. ST recommends to continue skilled ST services.      Pain Pain Assessment Pain Score: 0-No pain  Therapy/Group: Individual Therapy  Tiane Szydlowski  [facility] 06/09/2019, 4:22 PM

## 2019-06-09 NOTE — Progress Notes (Signed)
Occupational Therapy Session Note  Patient Details  Name: Joe Perry MRN: [id number] Date of Birth: 1951/08/30  Today's Date: 06/09/2019 OT Individual Time: [id number] OT Individual Time Calculation (min): 70 min    Short Term Goals: Week 1:  OT Short Term Goal 1 (Week 1): Pt will perform shower transfer with  mod A overall. OT Short Term Goal 2 (Week 1): Pt will perform LB dresing with mod A overall. OT Short Term Goal 3 (Week 1): Pt will attend to L side during functional task with mod cuing as needed. OT Short Term Goal 4 (Week 1): Pt will maintain standing balance during self care tasks with mod A.  Skilled Therapeutic Interventions/Progress Updates:    Upon entering the room, pt seated in wheelchair with no c/o, signs, or symptoms of pain. Pt declined toileting multiple times this session. Pt assisted via wheelchair into day room. Pt standing with min A for 8 minutes and 7 minutes respectively while engaged in pipe tree. Materials placed at midline and pt working on creating his own design with min cuing for attention. Pt returning to wheelchair secondary to fatigue. OT assisted pt to ADL apartment via wheelchair, pt ambulating on carpeted surface similar to home environment with RW and min - mod A. If pt ambulating in straight line he is min A but needing more assist with balance and RW management in turns and transfers. Pt demonstrated ability to sit <>stand from loft sofa and recliner chair as well as sit <>supine on standard bed height bed with min A for bed and mod A for furniture. Pt returning to room at end of session and remaining in wheelchair with chair alarm belt donned and call bell within reach. Tray table placed in front of him. Pt asleep before therapist exits the room.   Therapy Documentation Precautions:  Precautions Precautions: Fall Precaution Comments: Left lateral lean, Left hemianopsia, impulsive Restrictions Weight Bearing Restrictions: No   Therapy/Group:  Individual Therapy  Gypsy Decant 06/09/2019, 12:46 PM

## 2019-06-10 ENCOUNTER — Inpatient Hospital Stay (HOSPITAL_COMMUNITY): Payer: Self-pay

## 2019-06-10 ENCOUNTER — Inpatient Hospital Stay (HOSPITAL_COMMUNITY): Payer: Self-pay | Admitting: Physical Therapy

## 2019-06-10 ENCOUNTER — Inpatient Hospital Stay (HOSPITAL_COMMUNITY): Payer: Self-pay | Admitting: Occupational Therapy

## 2019-06-10 LAB — CREATININE, SERUM
Creatinine, Ser: 0.67 mg/dL (ref 0.61–1.24)
GFR calc Af Amer: >60 mL/min (ref >60)
GFR calc non Af Amer: >60 mL/min (ref >60)

## 2019-06-10 LAB — GLUCOSE, CAPILLARY
Glucose-Capillary: 105 mg/dL — ABNORMAL HIGH (ref 70–99)
Glucose-Capillary: 125 mg/dL — ABNORMAL HIGH (ref 70–99)
Glucose-Capillary: 113 mg/dL — ABNORMAL HIGH (ref 70–99)
Glucose-Capillary: 142 mg/dL — ABNORMAL HIGH (ref 70–99)

## 2019-06-10 NOTE — Progress Notes (Signed)
[place] PHYSICAL MEDICINE & REHABILITATION PROGRESS NOTE   Subjective/Complaints: No issues overnite   ROS- unable to obtain due to cognitive deficits    Objective:   No results found. No results for input(s): WBC, HGB, HCT, PLT in the last 72 hours. Recent Labs    06/10/19 0504  CREATININE 0.67    Intake/Output Summary (Last 24 hours) at 06/10/2019 0823 Last data filed at 06/09/2019 1842 Gross per 24 hour  Intake 897 ml  Output -  Net 897 ml     Physical Exam: Vital Signs Blood pressure (!) 142/89, pulse 60, temperature 98 F (36.7 C), temperature source Oral, resp. rate 16, SpO2 98 %.   Reviewed vitals General: No acute distress; sitting up in manual w/c, in room; flat affect, NAD Mood and affect are appropriate, but very flat facies/affect Heart: Regular rate and rhythm  Lungs: Clear to auscultation, breathing unlabored, no rales or wheezes Abdomen: Positive bowel sounds, soft nontender to palpation, nondistended Extremities: No clubbing, cyanosis, or edema Skin: No evidence of breakdown, no evidence of rash Neurologic:  motor strength is 5/5 in Right and 4/5 Left   deltoid, bicep, tricep, grip, hip flexor, knee extensors, ankle dorsiflexor and plantar flexor Sensory exam normal sensation to light touch  in bilateral upper and lower extremities Cerebellar exam normal finger to nose to finger as well as heel to shin in right  upper and lower extremities, Left side mild/moderate dysmetria on LUE and mild LLE  Musculoskeletal: Full range of motion in all 4 extremities. No joint swelling  Assessment/Plan: 1. Functional deficits secondary to RIght PCA infarct with left hemiataxia which require 3+ hours per day of interdisciplinary therapy in a comprehensive inpatient rehab setting.  Physiatrist is providing close team supervision and 24 hour management of active medical problems listed below.  Physiatrist and rehab team continue to assess barriers to  discharge/monitor patient progress toward functional and medical goals  Care Tool:  Bathing    Body parts bathed by patient: Chest, Abdomen, Front perineal area, Right upper leg, Face   Body parts bathed by helper: Right arm, Left arm, Buttocks, Right lower leg, Left lower leg, Left upper leg     Bathing assist Assist Level: Maximal Assistance - Patient 24 - 49%     Upper Body Dressing/Undressing Upper body dressing   What is the patient wearing?: Pull over shirt    Upper body assist Assist Level: Total Assistance - Patient < 25%    Lower Body Dressing/Undressing Lower body dressing      What is the patient wearing?: Incontinence brief     Lower body assist Assist for lower body dressing: Moderate Assistance - Patient 50 - 74%     Toileting Toileting    Toileting assist Assist for toileting: Moderate Assistance - Patient 50 - 74%     Transfers Chair/bed transfer  Transfers assist     Chair/bed transfer assist level: Minimal Assistance - Patient > 75%     Locomotion Ambulation   Ambulation assist      Assist level: Minimal Assistance - Patient > 75% Assistive device: Walker-rolling Max distance: 194ft   Walk 10 feet activity   Assist     Assist level: Minimal Assistance - Patient > 75% Assistive device: Walker-rolling   Walk 50 feet activity   Assist    Assist level: Minimal Assistance - Patient > 75% Assistive device: Walker-rolling    Walk 150 feet activity   Assist Walk 150 feet activity did not occur:  Safety/medical concerns  Assist level: Minimal Assistance - Patient > 75% Assistive device: Walker-rolling    Walk 10 feet on uneven surface  activity   Assist Walk 10 feet on uneven surfaces activity did not occur: Safety/medical concerns         Wheelchair     Assist Will patient use wheelchair at discharge?: Yes Type of Wheelchair: Manual    Wheelchair assist level: Moderate Assistance - Patient 50 - 74% Max  wheelchair distance: 75    Wheelchair 50 feet with 2 turns activity    Assist        Assist Level: Moderate Assistance - Patient 50 - 74%   Wheelchair 150 feet activity     Assist  Wheelchair 150 feet activity did not occur: Safety/medical concerns       Blood pressure (!) 142/89, pulse 60, temperature 98 F (36.7 C), temperature source Oral, resp. rate 16, SpO2 98 %.  Medical Problem List and Plan: 1.Left-sided weakness with right gaze preference and left neglectsecondary to right large PCA infarction status post IR revascularization occludedR PCOM and PCAas well as history of CVA in May 2017 and March 2020 -CIR PT, OT- team conf in am              -ELOS 10-14 days, supervision goals 2. Antithrombotics: -DVT/anticoagulation:Lovenox             -check venous dopplers today -antiplatelet therapy: Aspirin 81 mg daily, Brilinta 90 mg twice daily 3. Pain Management:Tylenol as needed 4. Mood:Aricept 10 mg nightly, -antipsychotic agents: seroquel 25mg  tid -try to reduce to limit day time sedation -start sleep chart 5. Neuropsych: This patientis notcapable of making decisions on hisown behalf. 6. Skin/Wound Care:routine care, skin checks. Improve nutrition 7. Fluids/Electrolytes/Nutrition:Routine in and outs with follow-up chemistries 8. Hypertension. Norvasc 5 mg daily. Monitor with increased mobility Vitals:   06/09/19 1920 06/10/19 0607  BP: 131/76 (!) 142/89  Pulse: 78 60  Resp: 19 16  Temp: 97.6 F (36.4 C) 98 F (36.7 C)  SpO2: 100% 98%  fair control 10/6 9. Diabetes mellitus. Hemoglobin A1c 9.8.  -continue glucophage 500 mg twice daily. -cbg's ac and hs -titrate regimen as needed - CBG (last 3)  Recent Labs    06/09/19 1659 06/09/19 2109 06/10/19 0642  GLUCAP 115* 151* 105*  Controlled 10/6 10.  UTI/enterococcusfaecalis. Completed 5-day course of Rocephin 11. Hyperlipidemia. Lipitor 12. Dysphagia. Dysphasia #1 thin liquid. Follow-up speech therapy and advance as tolerated 13. Left upper extremity involuntary movement/hemichorea?EEG negative -appears to be weakened and ataxic on exam although pt was inconsistent with effort--continue to follow 14. Nose bleeds/epistaxis  -will add Afrin prn for nose bleeds- usually can use x 3 days regularly- BP will tolerate.    LOS: 7 days A FACE TO FACE EVALUATION WAS PERFORMED  Charlett Blake 06/10/2019, 8:23 AM

## 2019-06-10 NOTE — Progress Notes (Signed)
Occupational Therapy Session Note  Patient Details  Name: Joe Perry MRN: [id number] Date of Birth: Dec 29, 1950  Today's Date: 06/10/2019 OT Individual Time: [id number] OT Individual Time Calculation (min): 55 min    Short Term Goals: Week 1:  OT Short Term Goal 1 (Week 1): Pt will perform shower transfer with  mod A overall. OT Short Term Goal 2 (Week 1): Pt will perform LB dresing with mod A overall. OT Short Term Goal 3 (Week 1): Pt will attend to L side during functional task with mod cuing as needed. OT Short Term Goal 4 (Week 1): Pt will maintain standing balance during self care tasks with mod A.  Skilled Therapeutic Interventions/Progress Updates:    Upon entering the room, pt supine in bed with NT present and finishing breakfast. However, meal stopped for safety as pt is having difficulty remaining awake. Supine >sit with supervision to EOB. Pt ambulating with RW and mod A to bathroom. Pt attempting to use bathroom for several minutes but unable to void. Pt donning pull over shirt with min A to pull down trunk. Pt donning pull over pants and brief with max A overall. Pt needing max multimodal cuing for sequencing and initiation. Pt standing and ambulating back out to sit in wheelchair at sink for grooming tasks with cuing. Pt remained in wheelchair at end of session with chair alarm belt donned and tray table placed in front. Call bell within reach.   Therapy Documentation Precautions:  Precautions Precautions: Fall Precaution Comments: Left lateral lean, Left hemianopsia, impulsive Restrictions Weight Bearing Restrictions: No General:   Vital Signs: Therapy Vitals Temp: 98 F (36.7 C) Temp Source: Oral Pulse Rate: 60 Resp: 16 BP: (!) 142/89 Patient Position (if appropriate): Lying Oxygen Therapy SpO2: 98 % O2 Device: Room Air Pain: Pain Assessment Pain Scale: 0-10 Pain Score: 0-No pain   Therapy/Group: Individual Therapy  Gypsy Decant 06/10/2019, 9:31  AM

## 2019-06-10 NOTE — Progress Notes (Signed)
Physical Therapy Session Note  Patient Details  Name: Joe Perry MRN: [id number] Date of Birth: December 07, 1950  Today's Date: 06/10/2019 PT Individual Time: 6190-1222 PT Individual Time Calculation (min): 70 min   Short Term Goals: Week 1:  PT Short Term Goal 1 (Week 1): Pt will ambulate 75 feet with LRAD and minA. PT Short Term Goal 2 (Week 1): Pt will complete 4 steps with minA. PT Short Term Goal 3 (Week 1): Pt will perform sit to stand with minA.  Skilled Therapeutic Interventions/Progress Updates:  Pt presented in w/c completing lunch. NT handed off supervision for meal with PTA provided cues for smaller spoonfuls (consuming Magic Cup). Pt transported to rehab gym and participated in placing clothespins to basketball hoop. Pt required mod multimodal cues to decrease posterior lean as pt tended to attempt to brace LE against w/c. Pt initially required max cues for reaching clothespins but improved with repetitions. Pt ambulated 42f to mat minA for straight path and mod A for turning with RW. Pt then ambulated weaving around 1 and 2 cones with emphasis on L turn/scanning. Pt required modA for turning with1 cone and mod/maxA when attempting to weave between 2 cones. Ambulated 1177fwith minA on straight path with modA when turning due to poor RW management. Participated in ambulating in hallway with RW scanning for number "3,4,5" pt was able to successfully scan 1/3 numbers with modA, pt was unable to locate latter 2 numbers despite maxA and PTA tapping on disk. Once back in w/c pt propelled x [id number] minA for maintaining straight trajectory. Pt remained in w/c at end of session and left with belt alarm on, call bell within reach and all needs met     Therapy Documentation Precautions:  Precautions Precautions: Fall Precaution Comments: Left lateral lean, Left hemianopsia, impulsive Restrictions Weight Bearing Restrictions: No General:   Vital Signs: Therapy Vitals Temp: 97.6 F  (36.4 C) Pulse Rate: 86 Resp: 18 BP: 113/68 Patient Position (if appropriate): Sitting Oxygen Therapy SpO2: 100 % O2 Device: Room Air Pain: Pain Assessment Pain Score: 0-No pain   Therapy/Group: Individual Therapy  Izel Hochberg  Angelys Yetman, PTA  06/10/2019, 3:47 PM

## 2019-06-10 NOTE — Plan of Care (Signed)
  Problem: Consults Goal: RH STROKE PATIENT EDUCATION Description: See Patient Education module for education specifics  Outcome: Progressing   Problem: RH BOWEL ELIMINATION Goal: RH STG MANAGE BOWEL WITH ASSISTANCE Description: STG Manage Bowel with min Assistance. Outcome: Progressing   Problem: RH SAFETY Goal: RH STG ADHERE TO SAFETY PRECAUTIONS W/ASSISTANCE/DEVICE Description: STG Adhere to Safety Precautions With  min Assistance/Device. Outcome: Progressing   Problem: RH KNOWLEDGE DEFICIT Goal: RH STG INCREASE KNOWLEDGE OF DIABETES Description: Wife will be able to manage DM using medication, dietary restrictions and monitoring CBGs using handouts and educational resources independently Outcome: Progressing Goal: RH STG INCREASE KNOWLEDGE OF HYPERTENSION Description: Wife will be able to manage HTN using medication, dietary restrictions and monitoring BP using handouts and educational resources independently Outcome: Progressing Goal: RH STG INCREASE KNOWLEDGE OF DYSPHAGIA/FLUID INTAKE Description: Wife will be able to manage dysphagia , dietary restrictions  using handouts and educational resources independently Outcome: Progressing Goal: RH STG INCREASE KNOWLEGDE OF HYPERLIPIDEMIA Description: Wife will be able to manage HLD using medication, dietary restrictions  using handouts and educational resources independently Outcome: Progressing Goal: RH STG INCREASE KNOWLEDGE OF STROKE PROPHYLAXIS Description: Wife will be able state understanding of secondary stroke prevention using handouts and educational resources independently Outcome: Progressing

## 2019-06-10 NOTE — Progress Notes (Signed)
Speech Language Pathology Daily Session Note  Patient Details  Name: Joe Perry MRN: [id number] Date of Birth: 1951-04-07  Today's Date: 06/10/2019 SLP Individual Time: 1005-1100 SLP Individual Time Calculation (min): 55 min  Short Term Goals: Week 1: SLP Short Term Goal 1 (Week 1): Pt will utlize multi-modal communication to indicate wants and needs with Mod A cues. SLP Short Term Goal 2 (Week 1): Pt will follow 2 step directions related to basic ADL in 7 out of 10 opportunities with Mod A cues. SLP Short Term Goal 3 (Week 1): Pt will complete familiar problem solving tasks related to ADLs with Mod A cues. SLP Short Term Goal 4 (Week 1): Pt will consume trials of dysphagia 2 diet with no overt s/s of aspiration/dysphagia over 3 sessions prior to upgraded with Min A cues for compensatory swallow strategies.  Skilled Therapeutic Interventions: Skilled ST services focused on swallow and language skills. Pt appeared to be attempting to read a devotional book on tray, when request pt read aloud with no awareness of errors. SLP asked if pt would like SLP to read the passage to him, pt gestured yes with head nod. SLP read devotional aloud while pt consumed dys 2 trial snack. Pt required min A verbal cues to clear left buccal pocketing and then during urgent request to use bathroom via gestures pt demonstrated oral holding and required mod A verbal cues to clear residue, likely due to distracting events. SLP and NT assisted pt to bathroom with RW, pt required mod A verbal cues for problem solving and demonstrated impulsive behavior, pt was unable to void. SLP facilitated identification of common objects from a field of 2 in 4 out 4 opportunities and within a field of 3 in 0 out 3 opportunities. Pt demonstrated ability to name 7 out 10 common objects with max A sentence completion, phonemic and demonstration cues. Pt was unable to sort cards by two colors despite max A verbal/visual cues. Pt demonstrated  ability to express wants/needs, when asked about turning TV on/off, pt stated " I think I would like some quiet." Pt was left in room with call bell within reach and chair alarm set. ST recommends to continue skilled ST services.      Pain Pain Assessment Pain Score: 0-No pain  Therapy/Group: Individual Therapy  Jissel Slavens  [facility] 06/10/2019, 12:35 PM

## 2019-06-11 ENCOUNTER — Inpatient Hospital Stay (HOSPITAL_COMMUNITY): Payer: Self-pay | Admitting: Occupational Therapy

## 2019-06-11 ENCOUNTER — Inpatient Hospital Stay (HOSPITAL_COMMUNITY): Payer: Self-pay | Admitting: Physical Therapy

## 2019-06-11 ENCOUNTER — Inpatient Hospital Stay (HOSPITAL_COMMUNITY): Payer: Self-pay

## 2019-06-11 LAB — GLUCOSE, CAPILLARY
Glucose-Capillary: 120 mg/dL — ABNORMAL HIGH (ref 70–99)
Glucose-Capillary: 125 mg/dL — ABNORMAL HIGH (ref 70–99)
Glucose-Capillary: 181 mg/dL — ABNORMAL HIGH (ref 70–99)
Glucose-Capillary: 181 mg/dL — ABNORMAL HIGH (ref 70–99)

## 2019-06-11 NOTE — Progress Notes (Signed)
Occupational Therapy Session Note  Patient Details  Name: Joe Perry MRN: [id number] Date of Birth: 18-Sep-1950  Today's Date: 06/11/2019 OT Individual Time: 0800-0900 OT Individual Time Calculation (min): 60 min    Short Term Goals: Week 1:  OT Short Term Goal 1 (Week 1): Pt will perform shower transfer with  mod A overall. OT Short Term Goal 2 (Week 1): Pt will perform LB dresing with mod A overall. OT Short Term Goal 3 (Week 1): Pt will attend to L side during functional task with mod cuing as needed. OT Short Term Goal 4 (Week 1): Pt will maintain standing balance during self care tasks with mod A.  Skilled Therapeutic Interventions/Progress Updates:    Pt began session with self feeding.  Noted pt in the bed slumped down at start of session eating breakfast unsupervised by staff but with tele-sitter.  Had him transfer to the edge of the bed with min guard assist to continue working on self feeding.  Noted increased ataxia in his trunk and LUE when attempting to use as a active assist for holding coffee cup along with the RUE.  Max instructional cueing for slower and smaller bites as he would take them too large.  Once completed, he transferred over to the wheelchair with mod assist stand pivot.  Once in the wheelchair he was taken over to the sink for some bathing and dressing.  Max instructional cueing for sequencing bathing, with increased ataxia in the LUE with attempted use to wash the LUE.  He was able to donn brief and pants with mod assist sit to stand with max assist for donning gripper socks.  He then donned pullover shirt with mod assist to start it and orient it.  Finished session with total assist to donn gripper socks.  Pt left in the wheelchair with call button and phone in reach and safety belt in place.    Therapy Documentation Precautions:  Precautions Precautions: Fall Precaution Comments: Left lateral lean, Left hemianopsia, impulsive Restrictions Weight Bearing  Restrictions: No  Pain: Pain Assessment Pain Scale: Faces Pain Score: 0-No pain ADL: See Care Tool Section for some details of selfcare tasks  Therapy/Group: Individual Therapy  Cheronda Erck ,OTR/L  06/11/2019, 9:40 AM

## 2019-06-11 NOTE — Progress Notes (Signed)
Speech Language Pathology Weekly Progress and Session Note  Patient Details  Name: Joe Perry MRN: [id number] Date of Birth: 1951/07/03  Beginning of progress report period: June 03, 2019 End of progress report period: June 11, 2019  Today's Date: 06/11/2019 SLP Individual Time: 3220-2542 SLP Individual Time Calculation (min): 44 min  Short Term Goals: Week 1: SLP Short Term Goal 1 (Week 1): Pt will utlize multi-modal communication to indicate wants and needs with Mod A cues. SLP Short Term Goal 1 - Progress (Week 1): Met SLP Short Term Goal 2 (Week 1): Pt will follow 2 step directions related to basic ADL in 7 out of 10 opportunities with Mod A cues. SLP Short Term Goal 2 - Progress (Week 1): Not met SLP Short Term Goal 3 (Week 1): Pt will complete familiar problem solving tasks related to ADLs with Mod A cues. SLP Short Term Goal 3 - Progress (Week 1): Met SLP Short Term Goal 4 (Week 1): Pt will consume trials of dysphagia 2 diet with no overt s/s of aspiration/dysphagia over 3 sessions prior to upgraded with Min A cues for compensatory swallow strategies. SLP Short Term Goal 4 - Progress (Week 1): Not met    New Short Term Goals: Week 2: SLP Short Term Goal 1 (Week 2): STG=LTG due to short ELOS  Weekly Progress Updates: Pt made moderate progress meeting 2 out 4 goals. Pt demonstrates ability to express wants/needs in response to yes/no questions and with occasional phrases with mod A verbal cues when staff is present. Pt is unable to utilize call bell to notify staff of needs. Pt demonstrated ability to name 7/10 common objects with max A cues and identify common objects from a field of 2. Pt continues to demonstrate impairments in basic problem solving, functionally communicating needs and consistent use of swallow strategies. SLP would recommend focusing upcoming reporting period of family education, basic problem solving with ADLs, functional method to express wants/needs  and carryover of swallow strategies on current diet dys 1 and thin. Pt would continue to benefit from skilled ST services in order to maximize functional independence and reduce burden of care, requiring 24 hour supervision and continued ST services.     Intensity: Minumum of 1-2 x/day, 30 to 90 minutes Frequency: 3 to 5 out of 7 days Duration/Length of Stay: 10/16 Treatment/Interventions: Cognitive remediation/compensation;Cueing hierarchy;Multimodal communication approach;Speech/Language facilitation;Functional tasks;Patient/family education;Dysphagia/aspiration precaution training   Daily Session  Skilled Therapeutic Interventions: Skilled ST services focused on swallow and cognitive skills. SLP facilitated PO consumption snack trial of dys 2, pt oral holding, deficits in swallow initiation and left pocketing that was only cleared by finger sweep provided by SLP. Pt demonstrated reduced sustained attention and impairments in swallow function, not previously observed by SLP in past two treatment sessions, however nurse was present and confirmed similar occasional behavior on current diet. SLP recommends focusing consistent carryover of swallow strategies with current diet dys 1 and thin, piror to continue solid upgrade trials. SLP also facilitated basic problem solving skills in ADL task brushing teeth, pt required min A verbal cues, however when asked to sort cards by two colors pt required max A verbal cues for problem solving with reduced sustained attention. Pt demonstrated ability to answer yes/no questions pertaining to self/orientation in 10/10 opportunities, however when given picture cards of actions pt demonstrated 5/10 accuracy in response to yes/no questions. Pt was left in room with call bell within reach and chair alarm set. ST recommends to continue skilled ST  services.     As of note, SLP had ordered dsy 2 lunch tray, however items presented as dys 1 and NT provided supervision piror  to ST session. General    Pain Pain Assessment Pain Score: 0-No pain  Therapy/Group: Individual Therapy  Samad Thon  [facility] 06/11/2019, 6:22 PM

## 2019-06-11 NOTE — Progress Notes (Signed)
Social Work Patient ID: Joe Perry, male   DOB: 12/13/1950, 68 y.o.   MRN: [id number] [name] with wife via telephone to discuss team conference goals min level of assist and discharge 10/16, due to will need much family education prior to discharge home. She will come in Tuesday @ 10:00 to begin preparing for him to come home. Has not heard anything regarding the Medicaid, have also contacted financial counselor to find out information.

## 2019-06-11 NOTE — Progress Notes (Signed)
[place] PHYSICAL MEDICINE & REHABILITATION PROGRESS NOTE   Subjective/Complaints: Sitting for breakfast with OT   ROS- unable to obtain due to cognitive deficits    Objective:   No results found. No results for input(s): WBC, HGB, HCT, PLT in the last 72 hours. Recent Labs    06/10/19 0504  CREATININE 0.67    Intake/Output Summary (Last 24 hours) at 06/11/2019 0845 Last data filed at 06/11/2019 0839 Gross per 24 hour  Intake 1260 ml  Output -  Net 1260 ml     Physical Exam: Vital Signs Blood pressure (!) 127/95, pulse 60, temperature 98 F (36.7 C), temperature source Oral, resp. rate 18, weight 78 kg, SpO2 98 %.   Reviewed vitals General: No acute distress; sitting up in manual w/c, in room; flat affect, NAD Mood and affect are appropriate, but very flat facies/affect Heart: Regular rate and rhythm  Lungs: Clear to auscultation, breathing unlabored, no rales or wheezes Abdomen: Positive bowel sounds, soft nontender to palpation, nondistended Extremities: No clubbing, cyanosis, or edema Skin: No evidence of breakdown, no evidence of rash Neurologic:  motor strength is 5/5 in Right and 4/5 Left   deltoid, bicep, tricep, grip, hip flexor, knee extensors, ankle dorsiflexor and plantar flexor Sensory exam reduced senstion LUE Cerebellar exam normal finger to nose to finger as well as heel to shin in right  upper and lower extremities, Left side /moderate dysmetria on LUE and mild LLE  Musculoskeletal: Full range of motion in all 4 extremities. No joint swelling  Assessment/Plan: 1. Functional deficits secondary to RIght PCA infarct with left hemiataxia which require 3+ hours per day of interdisciplinary therapy in a comprehensive inpatient rehab setting.  Physiatrist is providing close team supervision and 24 hour management of active medical problems listed below.  Physiatrist and rehab team continue to assess barriers to discharge/monitor patient progress toward  functional and medical goals  Care Tool:  Bathing    Body parts bathed by patient: Right arm, Left arm, Chest, Abdomen(UB only)   Body parts bathed by helper: Right arm, Left arm, Buttocks, Right lower leg, Left lower leg, Left upper leg     Bathing assist Assist Level: Supervision/Verbal cueing     Upper Body Dressing/Undressing Upper body dressing   What is the patient wearing?: Pull over shirt    Upper body assist Assist Level: Minimal Assistance - Patient > 75%    Lower Body Dressing/Undressing Lower body dressing      What is the patient wearing?: Incontinence brief     Lower body assist Assist for lower body dressing: Moderate Assistance - Patient 50 - 74%     Toileting Toileting    Toileting assist Assist for toileting: Maximal Assistance - Patient 25 - 49%     Transfers Chair/bed transfer  Transfers assist     Chair/bed transfer assist level: Minimal Assistance - Patient > 75%     Locomotion Ambulation   Ambulation assist      Assist level: Moderate Assistance - Patient 50 - 74% Assistive device: Walker-rolling Max distance: 20'   Walk 10 feet activity   Assist     Assist level: Moderate Assistance - Patient - 50 - 74% Assistive device: Walker-rolling   Walk 50 feet activity   Assist    Assist level: Minimal Assistance - Patient > 75% Assistive device: Walker-rolling    Walk 150 feet activity   Assist Walk 150 feet activity did not occur: Safety/medical concerns  Assist level: Minimal Assistance -  Patient > 75% Assistive device: Walker-rolling    Walk 10 feet on uneven surface  activity   Assist Walk 10 feet on uneven surfaces activity did not occur: Safety/medical concerns         Wheelchair     Assist Will patient use wheelchair at discharge?: Yes Type of Wheelchair: Manual    Wheelchair assist level: Moderate Assistance - Patient 50 - 74% Max wheelchair distance: 75    Wheelchair 50 feet with 2 turns  activity    Assist        Assist Level: Moderate Assistance - Patient 50 - 74%   Wheelchair 150 feet activity     Assist  Wheelchair 150 feet activity did not occur: Safety/medical concerns       Blood pressure (!) 127/95, pulse 60, temperature 98 F (36.7 C), temperature source Oral, resp. rate 18, weight 78 kg, SpO2 98 %.  Medical Problem List and Plan: 1.Left-sided weakness with right gaze preference and left neglectsecondary to right large PCA infarction status post IR revascularization occludedR PCOM and PCAas well as history of CVA in May 2017 and March 2020 -CIR PT, OT-Team conference today please see physician documentation under team conference tab, met with team face-to-face to discuss problems,progress, and goals. Formulized individual treatment plan based on medical history, underlying problem and comorbidities.             -ELOS 10-14 days, supervision goals 2. Antithrombotics: -DVT/anticoagulation:Lovenox             -check venous dopplers today -antiplatelet therapy: Aspirin 81 mg daily, Brilinta 90 mg twice daily 3. Pain Management:Tylenol as needed 4. Mood:Aricept 10 mg nightly, -antipsychotic agents: seroquel 36m tid -try to reduce to limit day time sedation -start sleep chart 5. Neuropsych: This patientis notcapable of making decisions on hisown behalf. 6. Skin/Wound Care:routine care, skin checks. Improve nutrition 7. Fluids/Electrolytes/Nutrition:Routine in and outs with follow-up chemistries 8. Hypertension. Norvasc 5 mg daily. Monitor with increased mobility Vitals:   06/10/19 1955 06/11/19 0249  BP: (!) 87/65 (!) 127/95  Pulse: 86 60  Resp: 16 18  Temp: 98 F (36.7 C) 98 F (36.7 C)  SpO2: 100% 98%  fair control 10/7 9. Diabetes mellitus. Hemoglobin A1c 9.8.  -continue glucophage 500 mg twice daily. -cbg's ac and  hs -titrate regimen as needed - CBG (last 3)  Recent Labs    06/10/19 1650 06/10/19 2059 06/11/19 0630  GLUCAP 113* 142* 120*  Controlled 10/7 10. UTI/enterococcusfaecalis. Completed 5-day course of Rocephin 11. Hyperlipidemia. Lipitor 12. Dysphagia. Dysphasia #1 thin liquid. Follow-up speech therapy and advance as tolerated 13. Left upper extremity involuntary movement/hemichorea?EEG negative -appears to be weakened and ataxic on exam although pt was inconsistent with effort--continue to follow 14. Nose bleeds/epistaxis  -will add Afrin prn for nose bleeds- usually can use x 3 days regularly- BP will tolerate.    LOS: 8 days A FACE TO FACE EVALUATION WAS PERFORMED  ACharlett Blake10/03/2019, 8:45 AM

## 2019-06-11 NOTE — Plan of Care (Signed)
  Problem: RH Attention Goal: LTG Patient will demonstrate this level of attention during functional activites (SLP) Description: LTG:  Patient will will demonstrate this level of attention during functional activites (SLP) Flowsheets Taken 06/11/2019 1838 by Charolett Bumpers, [place] Patient will demonstrate during cognitive/linguistic activities the attention type of: (downgraded goal to lack and inconsistent progress) Sustained Number of minutes patient will demonstrate attention during cognitive/linguistic activities: 3 minutes Taken 06/04/2019 1032 by Rutherford Nail, [place], [place] Patient will demonstrate this level of attention during cognitive/linguistic activities in: Controlled LTG: Patient will demonstrate this level of attention during cognitive/linguistic activities with assistance of (SLP): Minimal Assistance - Patient > 75% Note: Downgraded goal due to lack and inconsistent progress

## 2019-06-11 NOTE — Progress Notes (Signed)
Physical Therapy Session Note  Patient Details  Name: RAHKIM RABALAIS MRN: [id number] Date of Birth: 1951-05-15  Today's Date: 06/11/2019 PT Individual Time: 1107-1202 and 1400-1445 PT Individual Time Calculation (min): 55 min   Short Term Goals: Week 1:  PT Short Term Goal 1 (Week 1): Pt will ambulate 75 feet with LRAD and minA. PT Short Term Goal 2 (Week 1): Pt will complete 4 steps with minA. PT Short Term Goal 3 (Week 1): Pt will perform sit to stand with minA.  Skilled Therapeutic Interventions/Progress Updates: Tx1:  Pt presented in w/c agreeable to therapy. Pt denies pain throughout session. Pt transported to rehab gym and participated in gait training with RW focusing on negotiation of turns. Pt required mod/maxA for sequencing and RW management as pt required increased time and space to perform turns both L and R. Pt with minimal carryover after several trials. Performed STS x 5 without AD at [facility] for general strengthening. Performed toe taps with RW to 4in step with min/modA for initiation of LLE and sequencing as with multiple attempts pt attempted to move RW onto step and required increased time for LLE. Performed stand pivot transfer to w/c no AD with min/modA and pt returned to room at end of session with belt alarm on, call bell within reach and needs met.   Tx2: Pt presented in w/c at nsg station agreeable to therapy. Pt transported to rehab gym for energy conservation. Pt trained in ascending/descending stairs x 2 with minA with B rails and mod multimodal cues for turning at top of stairs. Participated in gait training with HHA 2f and 619fwith modA overall. Pt was better able to negotiate turns without RW however as became fatigued noted increased LLE adduction close to scissoring gait. Pt noted not to have significant lateral lean however. Pt then transported to day room and performed stand pivot transfer without AD with minA to NuStep and participated in NuStep L2 x 6 minutes  for endurance and global conditioning. Pt transferred back to w/c in same manner. Pt returned to nsg station at end of session with belt alarm placed and on.      Therapy Documentation Precautions:  Precautions Precautions: Fall Precaution Comments: Left lateral lean, Left hemianopsia, impulsive Restrictions Weight Bearing Restrictions: No    Therapy/Group: Individual Therapy  Emelynn Rance  Katleen Carraway, PTA  06/11/2019, 12:51 PM

## 2019-06-11 NOTE — Patient Care Conference (Signed)
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 Dyslipidemia, goal LDL below 70 03/12/2019  . Dysphagia due to recent stroke 03/12/2019  . Overweight 03/12/2019  . 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 Abnormality of gait   . Spasticity   . Gait disturbance, post-stroke   . Cerebrovascular accident (CVA) due to occlusion of vertebral artery ([place])   . CVA (cerebral infarction) 01/31/2016  . Type 2 diabetes mellitus without complication, without long-term current use of insulin ([place]) 01/31/2016  . Essential hypertension 01/31/2016  . History of CVA (cerebrovascular accident) 01/31/2016  . 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 Courtney Heys Social Worker Present: Ovidio Kin, LCSW Nurse Present: Isla Pence, RN PT Present: Barrie Folk, PT;Rosita Dechalus, PTA OT Present: Darleen Crocker, OT SLP Present: Other (comment)(Erin Tamala Julian) [place] Coordinator present : Gunnar Fusi, SLP     Current Status/Progress Goal Weekly Team Focus  Bowel/Bladder   Patient is incontinent of B/B,LBM 06/09/19, patietnhas schedule and prn stool  softeners and laxative orders  Patient will be continent of bowel and bladder  Q2 toileting with QS/PRN assessment, address incontience episodes in a timely manner   Swallow/Nutrition/ Hydration   Min-Supervision A, dys 1 and thin  Supervision with least restrictive diet  dys 2 trial tray, swallow strategies   ADL's   UB self care min A, LB self care mod A, toileting with max A, functional transfers/ambulation min - mod A  min A overall  balance, ADL retraining, endurance, strength, cognition, functional transfers, pt/family edu   Mobility   CGA with increased time bed mobiltiy, minA transfers, minA gait straight path modA with turning, minA w/c mobilty  min-CGA overall with LRAD  L scaning, L NMR, gait, transfers   Communication   Mod A, gestures and  yes/no. Max A naming and identifying objects  Min A for wants/needs  naming/ identifying common objects, word matching/letter spelling assessment   Safety/Cognition/ Behavioral Observations  Max-Mod A  Min A for basic  basic problem solving and sustained attention   Pain   Currently no evidence of pain,or discomfort, patient is confused no evidence of pain, monitor  unable to determine his goal due to his confusion,this will be address and remain pain -free  QS/PRN assessment and monitor, address any finding and document interventions and outcome   Skin   Skin is intact with no noted issues  Maintain skin integrity, no skin breakdown  QS/PRN assessment, reposition and turn patient as needed,      *See Care Plan and progress notes for long and short-term goals.     Barriers to Discharge  Current Status/Progress Possible Resolutions Date Resolved   Nursing  PT                    OT                  SLP                SW                Discharge Planning/Teaching Needs:  Home with wife who has been here som and seen him in therapies. Will need to go through all therapies prior to DC for education      Team  Discussion:  Making slow gains in therapies. Dys 2 trial today. Still needs many cues for tasks.Impulsive in his movements. Sleepy in am when given sleep med at night. When turns requires more assist due to safety. Nursing timed tolieting every 2 hrs for continence. When staff in room will tell them. Need to begin family training with caregivers in preparation for DC  Revisions to Treatment Plan:  DC 10/16    Medical Summary Current Status: Poor control left upper extremity and left lower extremity, decreased sensation.  Sitting balance is fair Weekly Focus/Goal: Work on sitting, improve coordination left upper limb.  Toileting program  Barriers to Discharge: Incontinence;Medical stability   Possible Resolutions to Barriers: Will need toileting program to try to improve continence.   Continued Need for Acute Rehabilitation Level of Care: The patient requires daily medical management by a physician with specialized training in physical medicine and rehabilitation for the following reasons: Direction of a multidisciplinary physical rehabilitation program to maximize functional independence : Yes Medical management of patient stability for increased activity during participation in an intensive rehabilitation regime.: Yes Analysis of laboratory values and/or radiology reports with any subsequent need for medication adjustment and/or medical intervention. : Yes   I attest that I was present, lead the team conference, and concur with the assessment and plan of the team. Teleconference held due to COVID 19   Amish Mintzer, [place] 06/11/2019, 2:41 PM

## 2019-06-11 NOTE — Progress Notes (Signed)
Physical Therapy Weekly Progress Note  Patient Details  Name: Joe Perry MRN: [id number] Date of Birth: 28-Sep-1950  Beginning of progress report period: June 04, 2019 End of progress report period: June 11, 2019  Today's Date: 06/11/2019   Patient has met 3 of 3 short term goals.  Pt has made steady progress towards goals. Pt continues to require increased cues for all mobility and continues to be limited in L scanning/awareness. Pt currently at overall minA level.  Patient continues to demonstrate the following deficits muscle weakness and muscle joint tightness, impaired timing and sequencing, abnormal tone, unbalanced muscle activation, motor apraxia, ataxia, decreased coordination and decreased motor planning and decreased initiation, decreased attention, decreased awareness, decreased problem solving, decreased safety awareness and delayed processing and therefore will continue to benefit from skilled PT intervention to increase functional independence with mobility.  Patient progressing toward long term goals..  Continue plan of care.  PT Short Term Goals Week 1:  PT Short Term Goal 1 (Week 1): Pt will ambulate 75 feet with LRAD and minA. PT Short Term Goal 1 - Progress (Week 1): Met PT Short Term Goal 2 (Week 1): Pt will complete 4 steps with minA. PT Short Term Goal 2 - Progress (Week 1): Met PT Short Term Goal 3 (Week 1): Pt will perform sit to stand with minA. PT Short Term Goal 3 - Progress (Week 1): Met Week 2:  PT Short Term Goal 1 (Week 2): STG=LTG due to ELOS       Therapy Documentation Precautions:  Precautions Precautions: Fall Precaution Comments: Left lateral lean, Left hemianopsia, impulsive Restrictions Weight Bearing Restrictions: No   Therapy/Group: Individual Therapy  Rosita DeChalus  Rosita DeChalus, PTA  06/11/2019, 3:44 PM

## 2019-06-12 ENCOUNTER — Inpatient Hospital Stay (HOSPITAL_COMMUNITY): Payer: Self-pay | Admitting: Physical Therapy

## 2019-06-12 ENCOUNTER — Inpatient Hospital Stay (HOSPITAL_COMMUNITY): Payer: Self-pay | Admitting: Speech Pathology

## 2019-06-12 ENCOUNTER — Inpatient Hospital Stay (HOSPITAL_COMMUNITY): Payer: Self-pay | Admitting: Occupational Therapy

## 2019-06-12 LAB — GLUCOSE, CAPILLARY
Glucose-Capillary: 91 mg/dL (ref 70–99)
Glucose-Capillary: 135 mg/dL — ABNORMAL HIGH (ref 70–99)
Glucose-Capillary: 139 mg/dL — ABNORMAL HIGH (ref 70–99)
Glucose-Capillary: 139 mg/dL — ABNORMAL HIGH (ref 70–99)

## 2019-06-12 NOTE — Progress Notes (Signed)
[place] PHYSICAL MEDICINE & REHABILITATION PROGRESS NOTE   Subjective/Complaints:  Discussed swallow with SLP mainly oral phase issues   ROS- unable to obtain due to cognitive deficits    Objective:   No results found. No results for input(s): WBC, HGB, HCT, PLT in the last 72 hours. Recent Labs    06/10/19 0504  CREATININE 0.67    Intake/Output Summary (Last 24 hours) at 06/12/2019 0750 Last data filed at 06/11/2019 1303 Gross per 24 hour  Intake 660 ml  Output -  Net 660 ml     Physical Exam: Vital Signs Blood pressure (!) 163/87, pulse 74, temperature 97.6 F (36.4 C), temperature source Oral, resp. rate 16, weight 78 kg, SpO2 100 %.   Reviewed vitals General: No acute distress; sitting up in manual w/c, in room; flat affect, NAD Mood and affect are appropriate, but very flat facies/affect Heart: Regular rate and rhythm  Lungs: Clear to auscultation, breathing unlabored, no rales or wheezes Abdomen: Positive bowel sounds, soft nontender to palpation, nondistended Extremities: No clubbing, cyanosis, or edema Skin: No evidence of breakdown, no evidence of rash Neurologic:  motor strength is 5/5 in Right and 4/5 Left   deltoid, bicep, tricep, grip, hip flexor, knee extensors, ankle dorsiflexor and plantar flexor Sensory exam reduced senstion LUE Cerebellar exam normal finger to nose to finger as well as heel to shin in right  upper and lower extremities, Left side /moderate dysmetria on LUE and mild LLE  Musculoskeletal: Full range of motion in all 4 extremities. No joint swelling  Assessment/Plan: 1. Functional deficits secondary to RIght PCA infarct with left hemiataxia which require 3+ hours per day of interdisciplinary therapy in a comprehensive inpatient rehab setting.  Physiatrist is providing close team supervision and 24 hour management of active medical problems listed below.  Physiatrist and rehab team continue to assess barriers to discharge/monitor  patient progress toward functional and medical goals  Care Tool:  Bathing    Body parts bathed by patient: Right arm, Left arm, Chest, Abdomen, Front perineal area, Buttocks, Face   Body parts bathed by helper: Right arm, Left arm, Buttocks, Right lower leg, Left lower leg, Left upper leg Body parts n/a: Right upper leg, Left upper leg, Right lower leg, Left lower leg(Did not attempt)   Bathing assist Assist Level: Minimal Assistance - Patient > 75%     Upper Body Dressing/Undressing Upper body dressing   What is the patient wearing?: Pull over shirt    Upper body assist Assist Level: Minimal Assistance - Patient > 75%    Lower Body Dressing/Undressing Lower body dressing      What is the patient wearing?: Pants, Underwear/pull up     Lower body assist Assist for lower body dressing: Moderate Assistance - Patient 50 - 74%     Toileting Toileting    Toileting assist Assist for toileting: Maximal Assistance - Patient 25 - 49%     Transfers Chair/bed transfer  Transfers assist     Chair/bed transfer assist level: Moderate Assistance - Patient 50 - 74%     Locomotion Ambulation   Ambulation assist      Assist level: Moderate Assistance - Patient 50 - 74% Assistive device: Other (comment)(HHA) Max distance: 42ft   Walk 10 feet activity   Assist     Assist level: Moderate Assistance - Patient - 50 - 74% Assistive device: No Device   Walk 50 feet activity   Assist    Assist level: Moderate Assistance -  Patient - 50 - 74% Assistive device: No Device    Walk 150 feet activity   Assist Walk 150 feet activity did not occur: Safety/medical concerns  Assist level: Minimal Assistance - Patient > 75% Assistive device: Walker-rolling    Walk 10 feet on uneven surface  activity   Assist Walk 10 feet on uneven surfaces activity did not occur: Safety/medical concerns         Wheelchair     Assist Will patient use wheelchair at  discharge?: Yes Type of Wheelchair: Manual    Wheelchair assist level: Moderate Assistance - Patient 50 - 74% Max wheelchair distance: 75    Wheelchair 50 feet with 2 turns activity    Assist        Assist Level: Moderate Assistance - Patient 50 - 74%   Wheelchair 150 feet activity     Assist  Wheelchair 150 feet activity did not occur: Safety/medical concerns       Blood pressure (!) 163/87, pulse 74, temperature 97.6 F (36.4 C), temperature source Oral, resp. rate 16, weight 78 kg, SpO2 100 %.  Medical Problem List and Plan: 1.Left-sided weakness with right gaze preference and left neglectsecondary to right large PCA infarction status post IR revascularization occludedR PCOM and PCAas well as history of CVA in May 2017 and March 2020 -CIR PT, OT-ELOS 10/16    2. Antithrombotics: -DVT/anticoagulation:Lovenox             -check venous dopplers today -antiplatelet therapy: Aspirin 81 mg daily, Brilinta 90 mg twice daily 3. Pain Management:Tylenol as needed 4. Mood:Aricept 10 mg nightly, -antipsychotic agents: seroquel 25mg  tid -try to reduce to limit day time sedation -start sleep chart 5. Neuropsych: This patientis notcapable of making decisions on hisown behalf. 6. Skin/Wound Care:routine care, skin checks. Improve nutrition 7. Fluids/Electrolytes/Nutrition:Routine in and outs with follow-up chemistries 8. Hypertension. Norvasc 5 mg daily. Monitor with increased mobility Vitals:   06/11/19 1928 06/12/19 0429  BP: 130/79 (!) 163/87  Pulse: 96 74  Resp: 17 16  Temp: 98 F (36.7 C) 97.6 F (36.4 C)  SpO2: 100% [id number]  Elevated systolic 99991111 9. Diabetes mellitus. Hemoglobin A1c 9.8.  -continue glucophage 500 mg twice daily. -cbg's ac and hs -titrate regimen as needed - CBG (last 3)  Recent Labs    06/11/19 1651  06/11/19 2104 06/12/19 0627  GLUCAP 181* 181* 91  Controlled 10/8 10. UTI/enterococcusfaecalis. Completed 5-day course of Rocephin 11. Hyperlipidemia. Lipitor 12. Dysphagia. Dysphasia #1 thin liquid. Follow-up speech therapy and advance as tolerated 13. Left upper extremity involuntary movement/hemichorea?EEG negative -appears to be weakened and ataxic on exam although pt was inconsistent with effort--continue to follow 14. Nose bleeds/epistaxis  -will add Afrin prn for nose bleeds- usually can use x 3 days regularly- BP will tolerate.    LOS: 9 days A FACE TO FACE EVALUATION WAS PERFORMED  Charlett Blake 06/12/2019, 7:50 AM

## 2019-06-12 NOTE — Progress Notes (Signed)
Physical Therapy Session Note  Patient Details  Name: Joe Perry MRN: [id number] Date of Birth: May 02, 1951  Today's Date: 06/12/2019 PT Individual Time: 1000-1115 PT Individual Time Calculation (min): 75 min   Short Term Goals: Week 2:  PT Short Term Goal 1 (Week 2): STG=LTG due to ELOS  Skilled Therapeutic Interventions/Progress Updates:   Pt received sitting in WC and agreeable to PT. Pt transported to rehab gym in [facility]. Gait training with no AD and HHA on the L x 28f with min assist overall with occasional LOB R and L requiring assist from PT to prevent LOB. Dynamic gait training to perform dual task to hold ball while ambulating x 2074fwith min0m70mossist and intermittent LOB L. PT also instructed pt in gait training with various AD to assess possible DME upon d/c with SPC x [id number] Rollator x 200ft60fd assist with SPC and CGA with rollator. Improved postural alignment, step width, midline orientation with Rollator compared to all other AD, will continue to assess.   Dynamic standing balance to hold ball with BUE and tap on target to performed reciprocal trunk rotations x 15 Bil; cues for full ROM on the L. Tall kneeling to perform lateral reach L with LUE and then place pegs into bucket, and then return with the RUE to the L. Min-mod assist and moderate cues for improved visual scanning L and functional use of the LUE.   [facility] to [facility] and all needs met.         Therapy Documentation Precautions:  Precautions Precautions: Fall Precaution Comments: Left lateral lean, Left hemianopsia, impulsive Restrictions Weight Bearing Restrictions: No   Pain: Denies.    Therapy/Group: Individual Therapy  AustiLorie Phenix/2020, 3:01 PM

## 2019-06-12 NOTE — Progress Notes (Signed)
Speech Language Pathology Daily Session Note  Patient Details  Name: Joe Perry MRN: [id number] Date of Birth: [birth year]  Today's Date: 06/12/2019 SLP Individual Time: 0730-0830 SLP Individual Time Calculation (min): 60 min  Short Term Goals: Week 2: SLP Short Term Goal 1 (Week 2): STG=LTG due to short ELOS  Skilled Therapeutic Interventions:  Skilled treatment session focused on dysphagia and communication. SLP recieved in pt and incontinent of urine. SLP and NT changed bed and pt's clothes (pt continues to unfasten brief). Pt continues to require Max A multimodal cues for bed mobility (specifically 1 step directions). SLP facilitated transfer from bed to wheelchair with Max A cues for motor initiation/movement of feet. Breakfast tray placed in front of pt with no initiation for tray set-up (no removal of plate top). Trials of soft solids supplemented. Pt required Max A faded to Mod A cues for clearing left buccal cavity. Pt at risk for choking and aspiration with dysphagia 2, continue to recommend dysphagia 1. Despite Max A cues, pt with garbled and low vocal intensity speech in response to basic questions related to meal, object naming of utensils and as well as orientation information. Pt left upright in wheelchair, lap belt alarm on and all needs within reach. Continue per current plan of care.      Pain    Therapy/Group: Individual Therapy  Bertis Hustead 06/12/2019, 8:22 AM

## 2019-06-12 NOTE — Progress Notes (Signed)
Occupational Therapy Weekly Progress Note  Patient Details  Name: Joe Perry MRN: [id number] Date of Birth: 1951/07/06  Beginning of progress report period: June 04, 2019 End of progress report period: June 14, 2019  Today's Date: 06/12/2019 OT Individual Time: 4503-8882 OT Individual Time Calculation (min): 55 min    Patient has met 4 of 4 short term goals. Pt making progress towards occupational therapy goals this week. Pt continues to experience lethargy during some therapy sessions but agreeable to OT intervention. Pt consistently standing with min A.Functional transfers with min - mod A with use of rollator. Pt performs UB self care with min A and LB self care with mod A overal. Plans for family education to start next week in preparation for discharge. Pt needing mod cuing for visual deficits, sequencing, and initiation.  Patient continues to demonstrate the following deficits: muscle weakness, decreased cardiorespiratoy endurance, motor apraxia, ataxia, decreased coordination and decreased motor planning, decreased initiation, decreased attention, decreased awareness, decreased problem solving, decreased safety awareness, decreased memory and delayed processing and decreased sitting balance, decreased standing balance, decreased postural control, hemiplegia and decreased balance strategies and therefore will continue to benefit from skilled OT intervention to enhance overall performance with BADL and Reduce care partner burden.  Patient progressing toward long term goals..  Continue plan of care.  OT Short Term Goals Week 1:  OT Short Term Goal 1 (Week 1): Pt will perform shower transfer with  mod A overall. OT Short Term Goal 1 - Progress (Week 1): Met OT Short Term Goal 2 (Week 1): Pt will perform LB dresing with mod A overall. OT Short Term Goal 2 - Progress (Week 1): Met OT Short Term Goal 3 (Week 1): Pt will attend to L side during functional task with mod cuing as  needed. OT Short Term Goal 3 - Progress (Week 1): Met OT Short Term Goal 4 (Week 1): Pt will maintain standing balance during self care tasks with mod A. OT Short Term Goal 4 - Progress (Week 1): Met Week 2:  OT Short Term Goal 1 (Week 2): STGs=LTGs secondary to upcoming discharge  Skilled Therapeutic Interventions/Progress Updates:    Upon entering the room, pt seated in wheelchair and appears restless. Pt is attempting to get into bed. Pt requesting to get into bed but four odor coming from pt. Pt standing with mod A and ambulating with rollator into bathroom. Pt needing min A for tight spaces in bathroom with rollator. Pt had BM in brief but was unaware. Pt continued to have BM and standing with min A balance while performing hygiene. Pt needing assistance for thoroughness with hygiene and exiting to wash hands. Pt needing mod cuing for sequencing for hand hygiene and then requesting to brush teeth. Pt donning pull over vest with min A and OT assisting pt to ADL apartment. Pt ambulating with rollator on carpeted surface with CGA to recliner chair and sofa. Pt ambulating in hallway with rollator with close supervision - CGA but if distracted or taking wide turns he can need as much as min - mod A for balance. Pt returning to room and returning to bed. OT assisted with repositioning, bed lowered, bed alarm activated, and floor mats placed for safety. Pt asleep before therapist exited the room secondary to fatigue.   Therapy Documentation Precautions:  Precautions Precautions: Fall Precaution Comments: Left lateral lean, Left hemianopsia, impulsive Restrictions Weight Bearing Restrictions: No General: General OT Amount of Missed Time: 20 Minutes   Therapy/Group: Individual  Therapy  Gypsy Decant 06/12/2019, 2:39 PM

## 2019-06-13 ENCOUNTER — Inpatient Hospital Stay (HOSPITAL_COMMUNITY): Payer: Self-pay | Admitting: Occupational Therapy

## 2019-06-13 ENCOUNTER — Inpatient Hospital Stay (HOSPITAL_COMMUNITY): Payer: Self-pay | Admitting: Speech Pathology

## 2019-06-13 ENCOUNTER — Inpatient Hospital Stay (HOSPITAL_COMMUNITY): Payer: Self-pay | Admitting: Physical Therapy

## 2019-06-13 LAB — GLUCOSE, CAPILLARY
Glucose-Capillary: 116 mg/dL — ABNORMAL HIGH (ref 70–99)
Glucose-Capillary: 84 mg/dL (ref 70–99)
Glucose-Capillary: 72 mg/dL (ref 70–99)
Glucose-Capillary: 93 mg/dL (ref 70–99)

## 2019-06-13 NOTE — Progress Notes (Signed)
Occupational Therapy Session Note  Patient Details  Name: Joe Perry MRN: [id number] Date of Birth: Jan 05, 1951  Today's Date: 06/13/2019 OT Individual Time: 0800-0900 & 1500-1530 OT Individual Time Calculation (min): 60 min & 30 min   Short Term Goals: Week 2:  OT Short Term Goal 1 (Week 2): STGs=LTGs secondary to upcoming discharge  Skilled Therapeutic Interventions/Progress Updates:    am session:  Patient in bed, alert.  Supine to SSP with min A.  SPT bed to/from w/c, arm chair, toilet min A and cues for hand placement/positioning.   toileting max A, sponge bath seated in w/c min A with cues for thoroughness, grooming (oral care and shaving with electric razor) supervision/set up and cues, UB dressing min A, LB dressing mod A, footwear max A/dep.  Completed standing balance with trunk mobility activities min A. Able to complete short distance ambulation without AD min/mod A with reach to floor to pick up object.  Patient with impaired awareness of balance deficit, mildly impulsive.  Returned to bed at close of session at his request with min a.  Bed alarm set, bed pads in place, telesitter, call bell in reach.    Pm session:  Patient attempting to get out of recliner with seat alarm going off when I arrived for pm session.  He is redirectable and states that he has to go to the bathroom.  Ambulation recliner to/from bathroom/toilet with rollator with mod A for directing rollator around obstacles and in tight spaces.  Brief was wet when we reached the toilet.  SPT min A.  Mod/max A to complete LB dressing and hygiene.  Completed UB ergometer 2 x 3 minutes.  Patient seated in w/c at close of session with seat belt alarm set, tray table and call bell in reach, telesitter.    Therapy Documentation Precautions:  Precautions Precautions: Fall Precaution Comments: Left lateral lean, Left hemianopsia, impulsive Restrictions Weight Bearing Restrictions: No General:   Vital Signs: Therapy  Vitals Pulse Rate: 90 Resp: 18 BP: 116/82 Patient Position (if appropriate): Sitting Oxygen Therapy SpO2: 100 % O2 Device: Room Air Pain: Pain Assessment Pain Scale: 0-10 Pain Score: 0-No pain Other Treatments:     Therapy/Group: Individual Therapy  Carlos Levering 06/13/2019, 3:49 PM

## 2019-06-13 NOTE — Progress Notes (Signed)
Social Work Patient ID: Joe Perry, male   DOB: 03-11-1951, 68 y.o.   MRN: [id number] have given [place] letter for community resources and to assist with utilities not being shut off. He will be taking wife to area places-[employer] and [employer] to try to get assist for their bills. Wife coming in Tuesday for family education and to learn pt's care in preparation for discharge next Friday.

## 2019-06-13 NOTE — Progress Notes (Signed)
Speech Language Pathology Daily Session Note  Patient Details  Name: Joe Perry MRN: [id number] Date of Birth: Oct 20, 1950  Today's Date: 06/13/2019 SLP Individual Time: 1030-1047 SLP Individual Time Calculation (min): 17 min  Short Term Goals: Week 2: SLP Short Term Goal 1 (Week 2): STG=LTG due to short ELOS  Skilled Therapeutic Interventions:  Skilled treatment session focused on communication goals. SLP recieved pt in bed asleep. Pt very difficult to arouse. SLP encouraged arousal with repositioning pt and pt able to maintain arousal for ~ 15 minutes. SLP facilitated production of language by playing Amazing Shirlee [place] on therapist's cell phone. Pt with brief periods of humming but no verbal expression of singing words. Pt continued to drift off to sleep. Will try [facility] at next available sessions. Pt left in bed with bed in lowest position, all needs within reach and tele-sitter present.      Pain    Therapy/Group: Individual Therapy  Socrates Cahoon 06/13/2019, 10:52 AM

## 2019-06-13 NOTE — Progress Notes (Signed)
Physical Therapy Session Note  Patient Details  Name: Joe Perry MRN: [id number] Date of Birth: 03-05-1951  Today's Date: 06/13/2019 PT Individual Time: 1300-1400 PT Individual Time Calculation (min): 60 min   Short Term Goals: Week 1:  PT Short Term Goal 1 (Week 1): Pt will ambulate 75 feet with LRAD and minA. PT Short Term Goal 1 - Progress (Week 1): Met PT Short Term Goal 2 (Week 1): Pt will complete 4 steps with minA. PT Short Term Goal 2 - Progress (Week 1): Met PT Short Term Goal 3 (Week 1): Pt will perform sit to stand with minA. PT Short Term Goal 3 - Progress (Week 1): Met Week 2:  PT Short Term Goal 1 (Week 2): STG=LTG due to ELOS  Skilled Therapeutic Interventions/Progress Updates:  Pt upright in wheelchair eating lunch with NT upon arrival for therapy. Pt allowed 10 minutes at beginning of PT session to finish his lunch. Pt required verbal cueing for pt to swallow and ask if he was ready for next spoonful. Pt ambulated 150 feet with rollator and minA from room>hallway. Pt requiring verbal cueing throughout trial of gait for safety awareness. Pt neglects L side especially during activity/task. Pt ambulated 20 feet with rollator and minA in hallway. Pt completed lateral side steppingx2 of about 10 feet each direction. Pt requiring verbal and tactile cueing for proper form technique of task. Pt ambulated about 20 feet into therapy gym with rollator and minA. Pt required rest break in chair d/t fatigue. Pt completed standing task of locating black cups with his R hand on the L side and handing them to PT on his R side, reaching across his body. Pt able to complete task with max verbal cueing for black cups placed on the far L side of the table. Then, pt completed ambulation task with finding 4 medium sized orange conesx2 placed throughout the therapy gym, 2 cones on R and 2 cones on L side. Pt able to easily locate cones on his R side but needed max verbal cueing to locate cones on L  side. Pt took seated rest break d/t fatigue and then ambulated, 75 feet, back to room with rollator and minA. Pt requiring verbal cueing for directions for safety. Pt left sitting upright in the recliner with safety bbelt in place, call bell within reach, and all needs met at this time.     Therapy Documentation Precautions:  Precautions Precautions: Fall Precaution Comments: Left lateral lean, Left hemianopsia, impulsive Restrictions Weight Bearing Restrictions: No  Pain: Denies pain   Therapy/Group: Individual Therapy  Olena Leatherwood, SPT  06/13/2019, 5:21 PM

## 2019-06-13 NOTE — Progress Notes (Signed)
[place] PHYSICAL MEDICINE & REHABILITATION PROGRESS NOTE   Subjective/Complaints:  Remains incont with q 2 h checks   ROS- unable to obtain due to cognitive deficits    Objective:   No results found. No results for input(s): WBC, HGB, HCT, PLT in the last 72 hours. No results for input(s): NA, K, CL, CO2, GLUCOSE, BUN, CREATININE, CALCIUM in the last 72 hours.  Intake/Output Summary (Last 24 hours) at 06/13/2019 0749 Last data filed at 06/12/2019 1838 Gross per 24 hour  Intake 840 ml  Output -  Net 840 ml     Physical Exam: Vital Signs Blood pressure (!) 143/78, pulse 71, temperature 98.4 F (36.9 C), resp. rate 16, weight 78 kg, SpO2 99 %.   Reviewed vitals General: No acute distress; sitting up in manual w/c, in room; flat affect, NAD Mood and affect are appropriate, but very flat facies/affect Heart: Regular rate and rhythm  Lungs: Clear to auscultation, breathing unlabored, no rales or wheezes Abdomen: Positive bowel sounds, soft nontender to palpation, nondistended Extremities: No clubbing, cyanosis, or edema Skin: No evidence of breakdown, no evidence of rash Neurologic:  motor strength is 5/5 in Right and 4/5 Left   deltoid, bicep, tricep, grip, hip flexor, knee extensors, ankle dorsiflexor and plantar flexor Sensory exam reduced senstion LUE Cerebellar exam normal finger to nose to finger as well as heel to shin in right  upper and lower extremities, Left side /moderate dysmetria on LUE and mild LLE  Musculoskeletal: Full range of motion in all 4 extremities. No joint swelling  Assessment/Plan: 1. Functional deficits secondary to RIght PCA infarct with left hemiataxia which require 3+ hours per day of interdisciplinary therapy in a comprehensive inpatient rehab setting.  Physiatrist is providing close team supervision and 24 hour management of active medical problems listed below.  Physiatrist and rehab team continue to assess barriers to discharge/monitor  patient progress toward functional and medical goals  Care Tool:  Bathing    Body parts bathed by patient: Right arm, Left arm, Chest, Abdomen, Front perineal area, Buttocks, Face   Body parts bathed by helper: Right arm, Left arm, Buttocks, Right lower leg, Left lower leg, Left upper leg Body parts n/a: Right upper leg, Left upper leg, Right lower leg, Left lower leg(Did not attempt)   Bathing assist Assist Level: Minimal Assistance - Patient > 75%     Upper Body Dressing/Undressing Upper body dressing   What is the patient wearing?: Pull over shirt    Upper body assist Assist Level: Minimal Assistance - Patient > 75%    Lower Body Dressing/Undressing Lower body dressing      What is the patient wearing?: Pants, Underwear/pull up     Lower body assist Assist for lower body dressing: Moderate Assistance - Patient 50 - 74%     Toileting Toileting    Toileting assist Assist for toileting: Moderate Assistance - Patient 50 - 74%     Transfers Chair/bed transfer  Transfers assist     Chair/bed transfer assist level: Contact Guard/Touching assist     Locomotion Ambulation   Ambulation assist      Assist level: Minimal Assistance - Patient > 75% Assistive device: Rollator Max distance: 200   Walk 10 feet activity   Assist     Assist level: Contact Guard/Touching assist Assistive device: Rollator   Walk 50 feet activity   Assist    Assist level: Minimal Assistance - Patient > 75% Assistive device: Rollator    Walk 150  feet activity   Assist Walk 150 feet activity did not occur: Safety/medical concerns  Assist level: Minimal Assistance - Patient > 75% Assistive device: Walker-rolling    Walk 10 feet on uneven surface  activity   Assist Walk 10 feet on uneven surfaces activity did not occur: Safety/medical concerns         Wheelchair     Assist Will patient use wheelchair at discharge?: Yes Type of Wheelchair: Manual     Wheelchair assist level: Moderate Assistance - Patient 50 - 74% Max wheelchair distance: 75    Wheelchair 50 feet with 2 turns activity    Assist        Assist Level: Moderate Assistance - Patient 50 - 74%   Wheelchair 150 feet activity     Assist  Wheelchair 150 feet activity did not occur: Safety/medical concerns       Blood pressure (!) 143/78, pulse 71, temperature 98.4 F (36.9 C), resp. rate 16, weight 78 kg, SpO2 99 %.  Medical Problem List and Plan: 1.Left-sided weakness with right gaze preference and left neglectsecondary to right large PCA infarction status post IR revascularization occludedR PCOM and PCAas well as history of CVA in May 2017 and March 2020 -CIR PT, OT-ELOS 10/16    2. Antithrombotics: -DVT/anticoagulation:Lovenox             -check venous dopplers today -antiplatelet therapy: Aspirin 81 mg daily, Brilinta 90 mg twice daily 3. Pain Management:Tylenol as needed 4. Mood:Aricept 10 mg nightly, -antipsychotic agents: seroquel 25mg  tid -try to reduce to limit day time sedation -start sleep chart 5. Neuropsych: This patientis notcapable of making decisions on hisown behalf. 6. Skin/Wound Care:routine care, skin checks. Improve nutrition 7. Fluids/Electrolytes/Nutrition:Routine in and outs with follow-up chemistries 8. Hypertension. Norvasc 5 mg daily. Monitor with increased mobility Vitals:   06/12/19 1935 06/13/19 0442  BP: 110/76 (!) 143/78  Pulse: 85 71  Resp: 17 16  Temp: 98 F (36.7 C) 98.4 F (36.9 C)  SpO2: 100% [id number]  Elevated systolic 0000000 9. Diabetes mellitus. Hemoglobin A1c 9.8.  -continue glucophage 500 mg twice daily. -cbg's ac and hs -titrate regimen as needed - CBG (last 3)  Recent Labs    06/12/19 1649 06/12/19 2111 06/13/19 0650  GLUCAP 139* 139* 116*  Controlled 10/9 10.  UTI/enterococcusfaecalis. Completed 5-day course of Rocephin 11. Hyperlipidemia. Lipitor 12. Dysphagia. Dysphasia #1 thin liquid. Follow-up speech therapy and advance as tolerated 13. Left upper extremity involuntary movement/hemichorea?EEG negative -appears to be weakened and ataxic on exam although pt was inconsistent with effort--continue to follow 14. Nose bleeds/epistaxis  -will add Afrin prn for nose bleeds- usually can use x 3 days regularly- BP will tolerate.    LOS: 10 days A FACE TO FACE EVALUATION WAS PERFORMED  Charlett Blake 06/13/2019, 7:49 AM

## 2019-06-14 LAB — GLUCOSE, CAPILLARY
Glucose-Capillary: 76 mg/dL (ref 70–99)
Glucose-Capillary: 144 mg/dL — ABNORMAL HIGH (ref 70–99)
Glucose-Capillary: 168 mg/dL — ABNORMAL HIGH (ref 70–99)

## 2019-06-15 ENCOUNTER — Inpatient Hospital Stay (HOSPITAL_COMMUNITY): Payer: Self-pay | Admitting: Physical Therapy

## 2019-06-15 LAB — GLUCOSE, CAPILLARY
Glucose-Capillary: 131 mg/dL — ABNORMAL HIGH (ref 70–99)
Glucose-Capillary: 90 mg/dL (ref 70–99)
Glucose-Capillary: 140 mg/dL — ABNORMAL HIGH (ref 70–99)
Glucose-Capillary: 83 mg/dL (ref 70–99)
Glucose-Capillary: 133 mg/dL — ABNORMAL HIGH (ref 70–99)

## 2019-06-15 NOTE — Progress Notes (Signed)
[place] PHYSICAL MEDICINE & REHABILITATION PROGRESS NOTE   Subjective/Complaints:  No new issues per nursing, patient denies any complaints however cognitively impaired.  Follows commands well.  Awake.  Finished breakfast  ROS- unable to obtain due to cognitive deficits    Objective:   No results found. No results for input(s): WBC, HGB, HCT, PLT in the last 72 hours. No results for input(s): NA, K, CL, CO2, GLUCOSE, BUN, CREATININE, CALCIUM in the last 72 hours.  Intake/Output Summary (Last 24 hours) at 06/15/2019 1148 Last data filed at 06/15/2019 0900 Gross per 24 hour  Intake 120 ml  Output -  Net 120 ml     Physical Exam: Vital Signs Blood pressure (!) 143/87, pulse 68, temperature (!) 97.5 F (36.4 C), resp. rate 18, weight 78 kg, SpO2 100 %.   Reviewed vitals General: No acute distress; sitting up in manual w/c, in room; flat affect, NAD Mood and affect are appropriate, but very flat facies/affect Heart: Regular rate and rhythm  Lungs: Clear to auscultation, breathing unlabored, no rales or wheezes Abdomen: Positive bowel sounds, soft nontender to palpation, nondistended Extremities: No clubbing, cyanosis, or edema Skin: No evidence of breakdown, no evidence of rash Neurologic:  motor strength is 5/5 in Right and 4/5 Left   deltoid, bicep, tricep, grip, hip flexor, knee extensors, ankle dorsiflexor and plantar flexor Sensory exam reduced senstion LUE Cerebellar exam normal finger to nose to finger as well as heel to shin in right  upper and lower extremities, Left side /moderate dysmetria on LUE and mild LLE  Musculoskeletal: Full range of motion in all 4 extremities. No joint swelling  Assessment/Plan: 1. Functional deficits secondary to RIght PCA infarct with left hemiataxia which require 3+ hours per day of interdisciplinary therapy in a comprehensive inpatient rehab setting.  Physiatrist is providing close team supervision and 24 hour management of active  medical problems listed below.  Physiatrist and rehab team continue to assess barriers to discharge/monitor patient progress toward functional and medical goals  Care Tool:  Bathing    Body parts bathed by patient: Right arm, Left arm, Chest, Abdomen, Front perineal area, Buttocks, Face, Right upper leg, Left upper leg   Body parts bathed by helper: Right arm, Left arm, Buttocks, Right lower leg, Left lower leg, Left upper leg Body parts n/a: Right lower leg, Left lower leg   Bathing assist Assist Level: Minimal Assistance - Patient > 75%     Upper Body Dressing/Undressing Upper body dressing   What is the patient wearing?: Pull over shirt    Upper body assist Assist Level: Minimal Assistance - Patient > 75%    Lower Body Dressing/Undressing Lower body dressing      What is the patient wearing?: Underwear/pull up, Pants     Lower body assist Assist for lower body dressing: Moderate Assistance - Patient 50 - 74%     Toileting Toileting    Toileting assist Assist for toileting: Moderate Assistance - Patient 50 - 74%     Transfers Chair/bed transfer  Transfers assist     Chair/bed transfer assist level: Contact Guard/Touching assist     Locomotion Ambulation   Ambulation assist      Assist level: Minimal Assistance - Patient > 75% Assistive device: Rollator Max distance: 150   Walk 10 feet activity   Assist     Assist level: Contact Guard/Touching assist Assistive device: Rollator   Walk 50 feet activity   Assist    Assist level: Minimal Assistance -  Patient > 75% Assistive device: Rollator    Walk 150 feet activity   Assist Walk 150 feet activity did not occur: Safety/medical concerns  Assist level: Minimal Assistance - Patient > 75% Assistive device: Rollator    Walk 10 feet on uneven surface  activity   Assist Walk 10 feet on uneven surfaces activity did not occur: Safety/medical concerns          Wheelchair     Assist Will patient use wheelchair at discharge?: Yes Type of Wheelchair: Manual    Wheelchair assist level: Moderate Assistance - Patient 50 - 74% Max wheelchair distance: 75    Wheelchair 50 feet with 2 turns activity    Assist        Assist Level: Moderate Assistance - Patient 50 - 74%   Wheelchair 150 feet activity     Assist  Wheelchair 150 feet activity did not occur: Safety/medical concerns       Blood pressure (!) 143/87, pulse 68, temperature (!) 97.5 F (36.4 C), resp. rate 18, weight 78 kg, SpO2 100 %.  Medical Problem List and Plan: 1.Left-sided weakness with right gaze preference and left neglectsecondary to right large PCA infarction status post IR revascularization occludedR PCOM and PCAas well as history of CVA in May 2017 and March 2020 -CIR PT, OT-ELOS 10/16    2. Antithrombotics: -DVT/anticoagulation:Lovenox             -check venous dopplers today -antiplatelet therapy: Aspirin 81 mg daily, Brilinta 90 mg twice daily 3. Pain Management:Tylenol as needed 4. Mood:Aricept 10 mg nightly, -antipsychotic agents: seroquel 25mg  tid -try to reduce to limit day time sedation -start sleep chart 5. Neuropsych: This patientis notcapable of making decisions on hisown behalf. 6. Skin/Wound Care:routine care, skin checks. Improve nutrition 7. Fluids/Electrolytes/Nutrition:Routine in and outs with follow-up chemistries 8. Hypertension. Norvasc 5 mg daily. Monitor with increased mobility Vitals:   06/14/19 2030 06/15/19 0301  BP: 116/67 (!) 143/87  Pulse: 79 68  Resp:  18  Temp: 98.3 F (36.8 C) (!) 97.5 F (36.4 C)  SpO2: 100% 100%  Fair control 06/15/2019 9. Diabetes mellitus. Hemoglobin A1c 9.8.  -continue glucophage 500 mg twice daily. -cbg's ac and hs -titrate regimen as needed - CBG  (last 3)  Recent Labs    06/14/19 1657 06/14/19 2127 06/15/19 0653  GLUCAP 168* 131* 90  Controlled 10/11 10. UTI/enterococcusfaecalis. Completed 5-day course of Rocephin 11. Hyperlipidemia. Lipitor 12. Dysphagia. Dysphasia #1 thin liquid. Follow-up speech therapy and advance as tolerated 13. Left upper extremity involuntary movement/hemichorea?EEG negative -appears to be weakened and ataxic on exam although pt was inconsistent with effort--continue to follow 14. Nose bleeds/epistaxis  -Resolved.    LOS: 12 days A FACE TO [place] E Kirsteins 06/15/2019, 11:48 AM

## 2019-06-15 NOTE — Progress Notes (Signed)
Physical Therapy Session Note  Patient Details  Name: Joe Perry MRN: [id number] Date of Birth: 01/10/1951  Today's Date: 06/15/2019 PT Individual Time: 0907-1007 PT Individual Time Calculation (min): 60 min   Short Term Goals: Week 2:  PT Short Term Goal 1 (Week 2): STG=LTG due to ELOS  Skilled Therapeutic Interventions/Progress Updates: Pt presented in bed with NT supervising breakfast. PTA completing supervision. Pt agreeable to sitting EOB to complete meal. Performed supine to sit EOB with supervision and use of bed features. Pt was able to sit EOB with complete meal with no posterior lean in unsupported sitting and moderate cues for smaller spoonfuls. Pt also required cues for swallow prior to consuming more food/drink. Once completed PTA donned shoes total A for time management and pt was able to don short sleeved shirt with supervision. Pt ambulated with rollator to bathroom for toileting with minA for DME management. Performed toilet transfer with minA. Pt unable to void after 5 min. PTA threaded brief and pants and pt performed SAT with minA and PTA provided modA for pulling brief/pants over hips. Pt ambulated to sink and performed hand hygiene in standing and oral care in standing with pt resting LUE on sink for support. Once oral care completed sat on rollator for brief rest and had incontinent BM. Transferred back to toilet and pt was able to maintain standing with use of wall rail x approx 5 min while PTA performed extensive peri-care. PTA donned new brief and pants total A for time management and pt ambulated HHA back to bed. PTA handed pt new long sleeved shirt however pt with increased difficulty threading shirt thus required minA for donning. Performed HHA transfer to w/c and pt remained in w/c at end of session with call bell within reach, belt alarm on, and needs met.      Therapy Documentation Precautions:  Precautions Precautions: Fall Precaution Comments: Left lateral  lean, Left hemianopsia, impulsive Restrictions Weight Bearing Restrictions: No    Therapy/Group: Individual Therapy      , PTA  06/15/2019, 12:35 PM  

## 2019-06-15 NOTE — Plan of Care (Signed)
  Problem: Consults Goal: RH STROKE PATIENT EDUCATION Description: See Patient Education module for education specifics  Outcome: Progressing   Problem: RH BOWEL ELIMINATION Goal: RH STG MANAGE BOWEL WITH ASSISTANCE Description: STG Manage Bowel with min Assistance. Outcome: Progressing   Problem: RH BLADDER ELIMINATION Goal: RH STG MANAGE BLADDER WITH ASSISTANCE Description: STG Manage Bladder With min Assistance Outcome: Progressing   Problem: RH SAFETY Goal: RH STG ADHERE TO SAFETY PRECAUTIONS W/ASSISTANCE/DEVICE Description: STG Adhere to Safety Precautions With  min Assistance/Device. Outcome: Progressing   Problem: RH KNOWLEDGE DEFICIT Goal: RH STG INCREASE KNOWLEDGE OF DIABETES Description: Wife will be able to manage DM using medication, dietary restrictions and monitoring CBGs using handouts and educational resources independently Outcome: Progressing Goal: RH STG INCREASE KNOWLEDGE OF HYPERTENSION Description: Wife will be able to manage HTN using medication, dietary restrictions and monitoring BP using handouts and educational resources independently Outcome: Progressing Goal: RH STG INCREASE KNOWLEDGE OF DYSPHAGIA/FLUID INTAKE Description: Wife will be able to manage dysphagia , dietary restrictions  using handouts and educational resources independently Outcome: Progressing Goal: RH STG INCREASE KNOWLEGDE OF HYPERLIPIDEMIA Description: Wife will be able to manage HLD using medication, dietary restrictions  using handouts and educational resources independently Outcome: Progressing Goal: RH STG INCREASE KNOWLEDGE OF STROKE PROPHYLAXIS Description: Wife will be able state understanding of secondary stroke prevention using handouts and educational resources independently Outcome: Progressing

## 2019-06-16 ENCOUNTER — Inpatient Hospital Stay (HOSPITAL_COMMUNITY): Payer: Self-pay

## 2019-06-16 ENCOUNTER — Inpatient Hospital Stay (HOSPITAL_COMMUNITY): Payer: Self-pay | Admitting: Occupational Therapy

## 2019-06-16 ENCOUNTER — Inpatient Hospital Stay (HOSPITAL_COMMUNITY): Payer: Self-pay | Admitting: Physical Therapy

## 2019-06-16 DIAGNOSIS — D62 Acute posthemorrhagic anemia: Secondary | ICD-10-CM

## 2019-06-16 DIAGNOSIS — I10 Essential (primary) hypertension: Secondary | ICD-10-CM

## 2019-06-16 DIAGNOSIS — I69391 Dysphagia following cerebral infarction: Secondary | ICD-10-CM

## 2019-06-16 DIAGNOSIS — E1165 Type 2 diabetes mellitus with hyperglycemia: Secondary | ICD-10-CM

## 2019-06-16 LAB — GLUCOSE, CAPILLARY
Glucose-Capillary: 100 mg/dL — ABNORMAL HIGH (ref 70–99)
Glucose-Capillary: 92 mg/dL (ref 70–99)
Glucose-Capillary: 137 mg/dL — ABNORMAL HIGH (ref 70–99)
Glucose-Capillary: 93 mg/dL (ref 70–99)

## 2019-06-16 MED ORDER — ASPIRIN 81 MG PO CHEW
81.0000 mg | CHEWABLE_TABLET | Freq: Every day | ORAL | Status: DC
  Administered 2019-06-17 – 2019-06-20 (×4): 81 mg via ORAL
  Filled 2019-06-16 (×4): qty 1

## 2019-06-16 NOTE — Progress Notes (Signed)
Speech Language Pathology Daily Session Note  Patient Details  Name: Joe Perry MRN: [id number] Date of Birth: [birth year]  Today's Date: 06/16/2019 SLP Individual Time: 0805-0900 SLP Individual Time Calculation (min): 55 min  Short Term Goals: Week 2: SLP Short Term Goal 1 (Week 2): STG=LTG due to short ELOS  Skilled Therapeutic Interventions: Skilled ST services focused on swallow and language skills. Pt demonstrated appropriate responses to greetings (ex: good morning, yes good) and ability to express wants/needs given yes/no questions pertaining to breakfast tray set up. Pt demonstrated ability to repeat at phrase/sentence level " How are you?", "I am fine" and " I need to go to the bathroom."  SLP facilitated basic problem solving skills utilizing call bell, pt was able to express need for help in order to go to the bathroom given scenarios, however required max A verbal/visual cues to locate call bell (on right side)/ demonstrate function, pt demonstrated recall and returned demonstration with 1 minute delay, however required max A HOH to locate call bell with 2 minute delay, indicating reduced short term memory and sustained attention. SLP also facilitated PO consumption of current diet (dys 1 and thin), pt required tactile cuing for continuous self-feeding in 1-2 minute intervals, secondary to oral holding and delayed AP transit with solids. Pt demonstrated appropriate oral clearance once swallow was initiated, however required max A verbal cues for continuious self-feeding to initiate motor movement (spoon to mouth) and to reduce oral holding/initate swallow with solids.Pt demonstrated reduce oral holding and swallow initiation with thin liquids via cup with x1 s/s aspiration immediate cough during consumption of 4oz. Pt required inappropriate amount of time to consume 1 bowl of cream of wheat and 4oz of thin liquid due to reduced sustained attention, motor planning and oral holding/delayed  swallow initiation. Pt was left in room with call bell within reach and chair alarm set. ST recommends to continue skilled ST services. SLP notified NT that breakfast was not completed.     Pain Pain Assessment Pain Score: 0-No pain  Therapy/Group: Individual Therapy  Beck Cofer  [facility] 06/16/2019, 12:46 PM

## 2019-06-16 NOTE — Progress Notes (Signed)
Patient appears asleep,upon rounding respiration unlabored-room air, no acute distress or discomfort,.Tel-sitter continue,bed in lowest position, call bell within reach, no noted seizure,monitor

## 2019-06-16 NOTE — Progress Notes (Signed)
PHYSICAL MEDICINE & REHABILITATION PROGRESS NOTE   Subjective/Complaints: Patient seen sitting up in bed this morning.  No reported issues over night, discussed with nursing.  ROS- unable to obtain due to cognition   Objective:   No results found. No results for input(s): WBC, HGB, HCT, PLT in the last 72 hours. No results for input(s): NA, K, CL, CO2, GLUCOSE, BUN, CREATININE, CALCIUM in the last 72 hours.  Intake/Output Summary (Last 24 hours) at 06/16/2019 1238 Last data filed at 06/16/2019 0845 Gross per 24 hour  Intake 190 ml  Output -  Net 190 ml     Physical Exam: Vital Signs Blood pressure 127/74, pulse 62, temperature 98 F (36.7 C), temperature source Oral, resp. rate 18, weight 78 kg, SpO2 100 %. Constitutional: No distress . Vital signs reviewed. HENT: Normocephalic.  Atraumatic. Eyes: EOMI. No discharge. Cardiovascular: No JVD. Respiratory: Normal effort.  No stridor. GI: Non-distended. Skin: Warm and dry.  Intact. Psych: Slow processing.  Restless. Musc: No edema in extremities.  No tenderness in extremities. Neurologic: Alert Motor: RUE/RLE: 5/5 proximal distal LUE/LLE: 4+/5 proximal distal Dysarthria   Assessment/Plan: 1. Functional deficits secondary to RIght PCA infarct with left hemiataxia which require 3+ hours per day of interdisciplinary therapy in a comprehensive inpatient rehab setting.  Physiatrist is providing close team supervision and 24 hour management of active medical problems listed below.  Physiatrist and rehab team continue to assess barriers to discharge/monitor patient progress toward functional and medical goals  Care Tool:  Bathing    Body parts bathed by patient: Right arm, Left arm, Chest, Abdomen, Front perineal area, Buttocks, Face, Right upper leg, Left upper leg   Body parts bathed by helper: Right arm, Left arm, Buttocks, Right lower leg, Left lower leg, Left upper leg Body parts n/a: Right lower leg, Left  lower leg   Bathing assist Assist Level: Minimal Assistance - Patient > 75%     Upper Body Dressing/Undressing Upper body dressing   What is the patient wearing?: Pull over shirt    Upper body assist Assist Level: Minimal Assistance - Patient > 75%    Lower Body Dressing/Undressing Lower body dressing      What is the patient wearing?: Underwear/pull up, Pants     Lower body assist Assist for lower body dressing: Moderate Assistance - Patient 50 - 74%     Toileting Toileting    Toileting assist Assist for toileting: Moderate Assistance - Patient 50 - 74%     Transfers Chair/bed transfer  Transfers assist     Chair/bed transfer assist level: Contact Guard/Touching assist     Locomotion Ambulation   Ambulation assist      Assist level: Minimal Assistance - Patient > 75% Assistive device: Rollator Max distance: 65ft   Walk 10 feet activity   Assist     Assist level: Minimal Assistance - Patient > 75% Assistive device: Rollator   Walk 50 feet activity   Assist    Assist level: Minimal Assistance - Patient > 75% Assistive device: Rollator    Walk 150 feet activity   Assist Walk 150 feet activity did not occur: Safety/medical concerns  Assist level: Minimal Assistance - Patient > 75% Assistive device: Rollator    Walk 10 feet on uneven surface  activity   Assist Walk 10 feet on uneven surfaces activity did not occur: Safety/medical concerns         Wheelchair     Assist Will patient use wheelchair at discharge?:  Yes Type of Wheelchair: Manual    Wheelchair assist level: Moderate Assistance - Patient 50 - 74% Max wheelchair distance: 75    Wheelchair 50 feet with 2 turns activity    Assist        Assist Level: Moderate Assistance - Patient 50 - 74%   Wheelchair 150 feet activity     Assist  Wheelchair 150 feet activity did not occur: Safety/medical concerns       Blood pressure 127/74, pulse 62,  temperature 98 F (36.7 C), temperature source Oral, resp. rate 18, weight 78 kg, SpO2 100 %.  Medical Problem List and Plan: 1.Left-sided weakness with right gaze preference and left neglectsecondary to right large PCA infarction status post IR revascularization occludedR PCOM and PCAas well as history of CVA in May 2017 and March 2020  Continue CIR 2. Antithrombotics: -DVT/anticoagulation:Lovenox             -venous dopplers negative for DVT -antiplatelet therapy: Aspirin 81 mg daily, Brilinta 90 mg twice daily 3. Pain Management:Tylenol as needed 4. Mood:Aricept 10 mg nightly, -antipsychotic agents: seroquel 25mg  tid -try to reduce to limit day time sedation -start sleep chart 5. Neuropsych: This patientis not capable of making decisions on hisown behalf. 6. Skin/Wound Care:routine care, skin checks. Improve nutrition 7. Fluids/Electrolytes/Nutrition:Routine in and outs 8. Hypertension. Norvasc 5 mg daily. Monitor with increased mobility Vitals:   06/15/19 1926 06/16/19 0343  BP: 116/76 127/74  Pulse: 80 62  Resp: 16 18  Temp: 97.9 F (36.6 C) 98 F (36.7 C)  SpO2: 100% 100%   Controlled on 10/12 9. Diabetes mellitus with hyperglycemia. Hemoglobin A1c 9.8.  -continue glucophage 500 mg twice daily. -cbg's ac and hs -titrate regimen as needed CBG (last 3)  Recent Labs    06/15/19 2054 06/16/19 0613 06/16/19 1136  GLUCAP 133* 100* 92   Slightly labile on 10/12 10. UTI/enterococcusfaecalis. Completed 5-day course of Rocephin 11. Hyperlipidemia. 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   D1 thins  Follow-up speech therapy and advance as tolerated 13. 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 Nose bleeds/epistaxis  -Resolved.   15.  Acute blood loss anemia  Hemoglobin 11.6 on 9/30  Will  order labs later this week  LOS: 13 days A FACE TO FACE EVALUATION WAS PERFORMED  Joe Perry Lorie Phenix 06/16/2019, 12:38 PM

## 2019-06-16 NOTE — Progress Notes (Signed)
Physical Therapy Session Note  Patient Details  Name: Joe Perry MRN: [id number] Date of Birth: [birth year]  Today's Date: 06/16/2019 PT Individual Time: 0908-1002 PT Individual Time Calculation (min): 54 min   Short Term Goals: Week 2:  PT Short Term Goal 1 (Week 2): STG=LTG due to ELOS  Skilled Therapeutic Interventions/Progress Updates: Pt presented in w/c agreeable to therapy. Pt ambulated to bathroom with rollator minA for DME management. Performed toilet transfers with minA for safety and sequencing. PTA threaded pants for time management and pt donned shirt with set up (-void). Once completed pt performed STS with minA and PTA provided maxA for LB clothing for time management. Pt ambulated to rehab gym with rollator with minA and max cues for direction and obstacle negotiation. Pt then ambulated to day room in same manner. Pt participated in clothespin placement at high/low table in standing with pt reaching and placing clothespin with L hand then reaching across to R with L hand and placing clothespin on table. Pt then participated in ambulation with rollator weaving through cones with PTA providing max A for safely negotiating objects. PTA then had pt scan for objects on L and R side with pt requiring mod A for scanning. Pt ambulated back to room with rollator at end of session and left with call bell within reach, belt alarm on, and needs met.      Therapy Documentation Precautions:  Precautions Precautions: Fall Precaution Comments: Left lateral lean, Left hemianopsia, impulsive Restrictions Weight Bearing Restrictions: No Other Treatments:      Therapy/Group: Individual Therapy  Zairah Arista  Kazim Corrales, PTA  06/16/2019, 12:38 PM

## 2019-06-16 NOTE — Plan of Care (Signed)
  Problem: Consults Goal: RH STROKE PATIENT EDUCATION Description: See Patient Education module for education specifics  Outcome: Progressing   Problem: RH BOWEL ELIMINATION Goal: RH STG MANAGE BOWEL WITH ASSISTANCE Description: STG Manage Bowel with min Assistance. Outcome: Progressing   Problem: RH BLADDER ELIMINATION Goal: RH STG MANAGE BLADDER WITH ASSISTANCE Description: STG Manage Bladder With min Assistance Outcome: Progressing   Problem: RH SAFETY Goal: RH STG ADHERE TO SAFETY PRECAUTIONS W/ASSISTANCE/DEVICE Description: STG Adhere to Safety Precautions With  min Assistance/Device. Outcome: Progressing   Problem: RH KNOWLEDGE DEFICIT Goal: RH STG INCREASE KNOWLEDGE OF DIABETES Description: Wife will be able to manage DM using medication, dietary restrictions and monitoring CBGs using handouts and educational resources independently Outcome: Progressing Goal: RH STG INCREASE KNOWLEDGE OF HYPERTENSION Description: Wife will be able to manage HTN using medication, dietary restrictions and monitoring BP using handouts and educational resources independently Outcome: Progressing Goal: RH STG INCREASE KNOWLEDGE OF DYSPHAGIA/FLUID INTAKE Description: Wife will be able to manage dysphagia , dietary restrictions  using handouts and educational resources independently Outcome: Progressing Goal: RH STG INCREASE KNOWLEGDE OF HYPERLIPIDEMIA Description: Wife will be able to manage HLD using medication, dietary restrictions  using handouts and educational resources independently Outcome: Progressing Goal: RH STG INCREASE KNOWLEDGE OF STROKE PROPHYLAXIS Description: Wife will be able state understanding of secondary stroke prevention using handouts and educational resources independently Outcome: Progressing

## 2019-06-16 NOTE — Progress Notes (Signed)
Occupational Therapy Session Note  Patient Details  Name: Joe Perry MRN: [id number] Date of Birth: 11-10-1950  Today's Date: 06/16/2019 OT Individual Time: 1345-1458 OT Individual Time Calculation (min): 73 min    Short Term Goals: Week 2:  OT Short Term Goal 1 (Week 2): STGs=LTGs secondary to upcoming discharge  Skilled Therapeutic Interventions/Progress Updates:    Upon entering the room, pt seated in wheelchair with no c/o pain and agreeable to OT intervention. Pt declines bathing and dressing this session but plans to shower tomorrow. Pt standing with min A and ambulating with rollator into bathroom with min - mod A overall for balance with tight turns and over threshold of bathroom. Pt able to manage clothing and hygiene while standing with min A for balance. Pt unable to void after sitting for several minutes. Pt standing at sink for hand hygiene and to wash face with mod A for standing balance and hand over hand assistance to sequence some of tasks this session. Pt returning to sit in wheelchair and donning button up shirt with increased time and mod cuing for technique and to locate button holes with increased time. Pt donning tennis shoes with min A for heel. Pt assisted via wheelchair to gym for dynavision task. Pt able to hit 22 targets while standing for 3 minutes. Pt needing mod multimodal cuing to locate targets on L side and standing with min - mod A for task. Pt left at RN station with chair alarm belt donned and call bell within reach.   Therapy Documentation Precautions:  Precautions Precautions: Fall Precaution Comments: Left lateral lean, Left hemianopsia, impulsive Restrictions Weight Bearing Restrictions: No Vital Signs: Therapy Vitals Temp: 97.7 F (36.5 C) Temp Source: Oral Pulse Rate: 84 Resp: 18 BP: 100/67 Patient Position (if appropriate): Sitting Oxygen Therapy SpO2: 100 % O2 Device: Room Air   Therapy/Group: Individual Therapy  Gypsy Decant 06/16/2019, 4:57 PM

## 2019-06-17 ENCOUNTER — Ambulatory Visit (HOSPITAL_COMMUNITY): Payer: Self-pay | Admitting: Physical Therapy

## 2019-06-17 ENCOUNTER — Encounter (HOSPITAL_COMMUNITY): Payer: Self-pay | Admitting: Occupational Therapy

## 2019-06-17 ENCOUNTER — Inpatient Hospital Stay (HOSPITAL_COMMUNITY): Payer: Self-pay

## 2019-06-17 ENCOUNTER — Encounter (HOSPITAL_COMMUNITY): Payer: Self-pay

## 2019-06-17 DIAGNOSIS — R7309 Other abnormal glucose: Secondary | ICD-10-CM

## 2019-06-17 DIAGNOSIS — I69319 Unspecified symptoms and signs involving cognitive functions following cerebral infarction: Secondary | ICD-10-CM

## 2019-06-17 LAB — GLUCOSE, CAPILLARY
Glucose-Capillary: 84 mg/dL (ref 70–99)
Glucose-Capillary: 146 mg/dL — ABNORMAL HIGH (ref 70–99)
Glucose-Capillary: 135 mg/dL — ABNORMAL HIGH (ref 70–99)
Glucose-Capillary: 130 mg/dL — ABNORMAL HIGH (ref 70–99)

## 2019-06-17 LAB — CREATININE, SERUM
Creatinine, Ser: 0.69 mg/dL (ref 0.61–1.24)
GFR calc Af Amer: >60 mL/min (ref >60)
GFR calc non Af Amer: >60 mL/min (ref >60)

## 2019-06-17 NOTE — Progress Notes (Signed)
Physical Therapy Session Note  Patient Details  Name: Joe Perry MRN: [id number] Date of Birth: [birth year]  Today's Date: 06/17/2019 PT Individual Time: [id number] PT Individual Time Calculation (min): 30 min   Short Term Goals: Week 2:  PT Short Term Goal 1 (Week 2): STG=LTG due to ELOS  Skilled Therapeutic Interventions/Progress Updates:     Patient in bed with Tele-sitter in room upon PT arrival. Patient alert and agreeable to PT session. Patient denied pain throughout session.   Therapeutic Activity: Bed Mobility: Patient performed supine to/from sit with supervision. He sat EOB with CGA-close supervision to don B non-skid socks with supervision and increased time to complete task. Able to complete task without cues from therapist, except "put your socks on." Transfers: Patient performed sit to/from stand x1 from the bed, x1 from the Rollator, and x1 from the recliner with CGA-min A using the Rollator. Provided verbal cues for locking breaks with hand-over-hand assist, and pushing up from a stable surface or arm rest for safety.  Gait Training:  Patient ambulated ~150 feet x2 first performing R turns then reversing directions and performing L turns from his room to the day room and back, going around the nurses station, using a rollator with min A for balance. Ambulated with decreased B step length and height L>R, veering L throughout with max cues to correct and perform visual scanning througout, decreased L knee extension, narrow BOS, variable foot placement L>R, and general gaze to the R. Patient was able to reverse the path taken to the Day room with cues to stop and look to the L when he nearly passed the turn to his hallway on the L and cued patient to look for room 10 on the L and patient ambulated the full length of the hall and had to turn around to find his room when it was on the R with mod-max cues.   Patient in bed at end of session with breaks locked, bed alarm set, and  all needs within reach.    Therapy Documentation Precautions:  Precautions Precautions: Fall Precaution Comments: Left lateral lean, Left hemianopsia, impulsive Restrictions Weight Bearing Restrictions: No    Therapy/Group: Individual Therapy  Katricia Prehn L Hawley Michel PT, DPT  06/17/2019, 12:56 PM

## 2019-06-17 NOTE — Progress Notes (Signed)
[facility] PHYSICAL MEDICINE & REHABILITATION PROGRESS NOTE   Subjective/Complaints: Patient seen laying in bed this AM.  No reported issues overnight.   ROS- unable to obtain due to cognition   Objective:   No results found. No results for input(s): WBC, HGB, HCT, PLT in the last 72 hours. Recent Labs    06/17/19 0610  CREATININE 0.69    Intake/Output Summary (Last 24 hours) at 06/17/2019 0951 Last data filed at 06/16/2019 1856 Gross per 24 hour  Intake 350 ml  Output -  Net 350 ml     Physical Exam: Vital Signs Blood pressure (!) 139/94, pulse 69, temperature (!) 97.5 F (36.4 C), temperature source Oral, resp. rate 18, weight 78 kg, SpO2 99 %. Constitutional: No distress . Vital signs reviewed. HENT: Normocephalic.  Atraumatic. Eyes: EOMI. No discharge. Cardiovascular: No JVD. Respiratory: Normal effort.  No stridor. GI: Non-distended. Skin: Warm and dry.  Intact. Psych: Unable to assess due to cognition, although less restless Musc: No edema in extremities.  No tenderness in extremities. Neurologic: Alert Motor: Limited due to ability to follow commands Dysarthria  Assessment/Plan: 1. Functional deficits secondary to RIght PCA infarct with left hemiataxia which require 3+ hours per day of interdisciplinary therapy in a comprehensive inpatient rehab setting.  Physiatrist is providing close team supervision and 24 hour management of active medical problems listed below.  Physiatrist and rehab team continue to assess barriers to discharge/monitor patient progress toward functional and medical goals  Care Tool:  Bathing  Bathing activity did not occur: Refused Body parts bathed by patient: Right arm, Left arm, Chest, Abdomen, Front perineal area, Buttocks, Face, Right upper leg, Left upper leg   Body parts bathed by helper: Right arm, Left arm, Buttocks, Right lower leg, Left lower leg, Left upper leg Body parts n/a: Right lower leg, Left lower leg    Bathing assist Assist Level: Minimal Assistance - Patient > 75%     Upper Body Dressing/Undressing Upper body dressing   What is the patient wearing?: Button up shirt    Upper body assist Assist Level: Supervision/Verbal cueing    Lower Body Dressing/Undressing Lower body dressing      What is the patient wearing?: Underwear/pull up, Pants     Lower body assist Assist for lower body dressing: Moderate Assistance - Patient 50 - 74%     Toileting Toileting    Toileting assist Assist for toileting: Minimal Assistance - Patient > 75%     Transfers Chair/bed transfer  Transfers assist     Chair/bed transfer assist level: Contact Guard/Touching assist     Locomotion Ambulation   Ambulation assist      Assist level: Minimal Assistance - Patient > 75% Assistive device: Rollator Max distance: 6ft   Walk 10 feet activity   Assist     Assist level: Minimal Assistance - Patient > 75% Assistive device: Rollator   Walk 50 feet activity   Assist    Assist level: Minimal Assistance - Patient > 75% Assistive device: Rollator    Walk 150 feet activity   Assist Walk 150 feet activity did not occur: Safety/medical concerns  Assist level: Minimal Assistance - Patient > 75% Assistive device: Rollator    Walk 10 feet on uneven surface  activity   Assist Walk 10 feet on uneven surfaces activity did not occur: Safety/medical concerns         Wheelchair     Assist Will patient use wheelchair at discharge?: Yes Type of Wheelchair: Manual  Wheelchair assist level: Moderate Assistance - Patient 50 - 74% Max wheelchair distance: 75    Wheelchair 50 feet with 2 turns activity    Assist        Assist Level: Moderate Assistance - Patient 50 - 74%   Wheelchair 150 feet activity     Assist  Wheelchair 150 feet activity did not occur: Safety/medical concerns       Blood pressure (!) 139/94, pulse 69, temperature (!) 97.5 F (36.4  C), temperature source Oral, resp. rate 18, weight 78 kg, SpO2 99 %.  Medical Problem List and Plan: 1.Left-sided weakness with right gaze preference and left neglectsecondary to right large PCA infarction status post IR revascularization occludedR PCOM and PCAas well as history of CVA in May 2017 and March 2020  Cont CIR 2. Antithrombotics: -DVT/anticoagulation:Lovenox, Cr WNL on 10/13             -venous dopplers negative for DVT -antiplatelet therapy: Aspirin 81 mg daily, Brilinta 90 mg twice daily 3. Pain Management:Tylenol as needed 4. Mood:Aricept 10 mg nightly, -antipsychotic agents: seroquel 25mg  tid -try to reduce to limit day time sedation -start sleep pending 5. Neuropsych: This patientis not capable of making decisions on hisown behalf due to post-stroke cognitive deficits 6. Skin/Wound Care:routine care, skin checks. Improve nutrition 7. Fluids/Electrolytes/Nutrition:Routine in and outs 8. Hypertension. Norvasc 5 mg daily. Monitor with increased mobility Vitals:   06/16/19 2002 06/17/19 0445  BP: 114/61 (!) 139/94  Pulse: 73 69  Resp: 16 18  Temp: 98.6 F (37 C) (!) 97.5 F (36.4 C)  SpO2: 100% 99%   Relatively controlled on 10/13 9. Diabetes mellitus with hyperglycemia. Hemoglobin A1c 9.8.  -continue glucophage 500 mg twice daily. -cbg's ac and hs -titrate regimen as needed CBG (last 3)  Recent Labs    06/16/19 1620 06/16/19 2105 06/17/19 0627  GLUCAP 137* 93 84   Slightly labile on 10/13 10. UTI/enterococcusfaecalis. Completed 5-day course of Rocephin 11. Hyperlipidemia. 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   D1 thins  Follow-up speech therapy and advance as tolerated 13. 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 Nose bleeds/epistaxis  -Resolved.   15.  Acute blood  loss anemia  Hemoglobin 11.6 on 9/30  Will order labs later this week  LOS: 14 days A FACE TO FACE EVALUATION WAS PERFORMED  Georgi Tuel Lorie Phenix 06/17/2019, 9:51 AM

## 2019-06-17 NOTE — Progress Notes (Signed)
Physical Therapy Session Note  Patient Details  Name: Joe Perry MRN: [id number] Date of Birth: 27-Sep-1950  Today's Date: 06/17/2019 PT Individual Time: 1315-1415 PT Individual Time Calculation (min): 60 min   Short Term Goals: Week 2:  PT Short Term Goal 1 (Week 2): STG=LTG due to ELOS  Skilled Therapeutic Interventions/Progress Updates: Pt presented in w/c with wife Joe Perry present supervising pt eating lunch. PTA returned 15 min later to allow completaion and then agreeable to therapy. Pt denies pain during session. Session focused on hands on training with wife in preparation for d/c. Pt transported to ortho gym and demonstrated then performed ambulatory transfer to car for car transfer. Pt was minA for ambulation with RW but CGA with increased time and verbal cues for transfer. PTA educated wife re: L inattention and field cut when turning to L to enter car. Pt also performed car transfer with wife in same manner. Joe Perry was able to provide appropriate cues and demonstrated good safety with rollator. PTA then demonstrated current level of assist for ambulating in distracting environment and around objects by weaving around cones. Pt required minA fading to mod with increased distractions. Pt performed same activity with wife requiring same level of assist as PTA had to intervene and stop pt and wife at pt demonstrated increasing to L. Provided edu regarding when best to just stop and "reset" to correct lean for improved safety with wife verbalizing understanding. Pt then transported to rehab gym and participated in ascending/descending x 4 step with B rails as wife verified home set up. Pt was able to perform with both PTA and wife with CGA and heavy verbal cues for sequencing. Pt then ambulated back to room with wife providing CGA and appropriate verbal cues. Pt returned to w/c in room and left with belt alarm on, call bell within reach and needs met.      Therapy Documentation Precautions:   Precautions Precautions: Fall Precaution Comments: Left lateral lean, Left hemianopsia, impulsive Restrictions Weight Bearing Restrictions: No General: PT Amount of Missed Time (min): 15 Minutes(lunch) PT Missed Treatment Reason: Other (Comment) Vital Signs: Therapy Vitals Temp: 98.3 F (36.8 C) Temp Source: Oral Pulse Rate: 66 Resp: 18 BP: 109/65 Patient Position (if appropriate): Sitting Oxygen Therapy SpO2: 97 % O2 Device: Room Air Pain: Pain Assessment Pain Score: 0-No pain   Therapy/Group: Individual Therapy  Joe Perry  Joe Perry, PTA  06/17/2019, 3:59 PM

## 2019-06-17 NOTE — Progress Notes (Signed)
Speech Language Pathology Daily Session Note  Patient Details  Name: Joe Perry MRN: [id number] Date of Birth: Sep 20, 1950  Today's Date: 06/17/2019 SLP Individual Time: 1002-1115 SLP Individual Time Calculation (min): 73 min  Short Term Goals: Week 2: SLP Short Term Goal 1 (Week 2): STG=LTG due to short ELOS  Skilled Therapeutic Interventions: Skilled ST services focused on education swallow and language skills. Pt demonstrated restless behaviors throughout session while in bed, but did not want to transfer to chair given yes/no questions and visual demonstration to clarify comprehension. SLP facilitated expressing naming of common objects with ability to name 9/10 objects with mod A verbal cues for sentence completion and demonstration cues. Pt demonstrated ability to match objects in a field of 2 with photo picture in 4/5 opportunities and unable to match objects to line drawing in 0/5 opportunities with items places on right side of tray. Pt was only able to identify named object in a field of 3 in 1/5 opportunities, suggest impacted by left inattention and favored items on right side.  SLP also facilitated PO consumption of dys 1 and thin via straw snack, pt demonstrated functional motor initiation during self feeding, mild oral holding/swallow initiation compared to yesterday's session and no overt s/s aspiration. Pt demonstrated increase overall sustained attention during task compared to yesterday's session with mod A verbal cues for redirection in 10 minute intervals.  Pt's wife arrived at the end of therapy session and following OT agreed to allow 15 minutes for ST education. SLP provided education on pt's deficits and ways to assist pt in areas of language, cognition and swallowing. SLP provided personalized handout addressing the areas above, as well as dys 1 diet and memory strategies handout. Pt's wife will need continued education, demonstrating comprehension of deficits stating "  he just needs to be strong and god will show me what to do." Education is scheduled for tomorrow. Pt was left in room with wife, call bell within reach and bed alarm set. ST recommends to continue skilled ST services.     Pain Pain Assessment Pain Score: 0-No pain  Therapy/Group: Individual Therapy  Kendrix Orman  [facility] 06/17/2019, 12:35 PM

## 2019-06-17 NOTE — Progress Notes (Signed)
Occupational Therapy Session Note  Patient Details  Name: NADER IPPOLITO MRN: [id number] Date of Birth: 1950-12-25  Today's Date: 06/17/2019 OT Individual Time: 1115-1209 OT Individual Time Calculation (min): 54 min    Short Term Goals: Week 2:  OT Short Term Goal 1 (Week 2): STGs=LTGs secondary to upcoming discharge  Skilled Therapeutic Interventions/Progress Updates:   Pt transitioned from SLP session to OT session without difficulty. Pt with no signs or symptoms of pain throughout session. Pt's wife, [name], present for hands on family education. OT provided education on pt's current progress, safety concerns, and mobility with RW. Caregiver returning demonstrations with min cuing and doing very well with providing cuing for safety awareness throughout session. Pt bathing from seated position on shower chair with min guard and mod cuing for sequencing of task. Pt exiting the bathroom and seated in wheelchair to don clothing items with set up A for UB self care and min A for LB self care. Sit <>stand from wheelchair with close supervision to pull up pants. Pt utilized figure four position to donn B socks. Pt remained in wheelchair with chair alarm belt donned and activated. OT answering questions for caregiver and scheduling family education for tomorrow as well. Caregiver feeling "good" about OT family edu.  Therapy Documentation Precautions:  Precautions Precautions: Fall Precaution Comments: Left lateral lean, Left hemianopsia, impulsive Restrictions Weight Bearing Restrictions: No General:   Vital Signs: Therapy Vitals BP: 140/90   Therapy/Group: Individual Therapy  Gypsy Decant 06/17/2019, 12:31 PM

## 2019-06-17 NOTE — Progress Notes (Signed)
Social Work Patient ID: Joe Perry, male   DOB: [birth year], 68 y.o.   MRN: [id number]  Wife is here for educaition with therapies and it is going well. She is able to direct him and he does listen to her. Aware he will need 24 hr care at discharge for safety. She plans to come in tomorrow for more training prior to discharge 10/16.

## 2019-06-18 ENCOUNTER — Inpatient Hospital Stay (HOSPITAL_COMMUNITY): Payer: Self-pay | Admitting: Occupational Therapy

## 2019-06-18 ENCOUNTER — Ambulatory Visit (HOSPITAL_COMMUNITY): Payer: Self-pay | Admitting: Physical Therapy

## 2019-06-18 ENCOUNTER — Inpatient Hospital Stay (HOSPITAL_COMMUNITY): Payer: Self-pay | Admitting: Physical Therapy

## 2019-06-18 ENCOUNTER — Encounter (HOSPITAL_COMMUNITY): Payer: Self-pay | Admitting: Speech Pathology

## 2019-06-18 DIAGNOSIS — E785 Hyperlipidemia, unspecified: Secondary | ICD-10-CM

## 2019-06-18 DIAGNOSIS — R0989 Other specified symptoms and signs involving the circulatory and respiratory systems: Secondary | ICD-10-CM

## 2019-06-18 LAB — GLUCOSE, CAPILLARY
Glucose-Capillary: 85 mg/dL (ref 70–99)
Glucose-Capillary: 88 mg/dL (ref 70–99)
Glucose-Capillary: 109 mg/dL — ABNORMAL HIGH (ref 70–99)
Glucose-Capillary: 131 mg/dL — ABNORMAL HIGH (ref 70–99)

## 2019-06-18 NOTE — Progress Notes (Signed)
Social Work Patient ID: Joe Perry, male   DOB: 1950/12/07, 69 y.o.   MRN: [id number]  Met with pt and wife to discuss team conference progress and plan still for discharge Friday 10/16. She was back today for further training in preparation for discharge. Will order equipment and follow up. Work toward discharge Friday.

## 2019-06-18 NOTE — Discharge Summary (Signed)
Physician Discharge Summary  Patient ID: Joe Perry MRN: [id number] DOB/AGE: 01/20/1951 68 y.o.  Admit date: 06/03/2019 Discharge date: 06/20/2019  Discharge Diagnoses:  Principal Problem:   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 (Joe Perry) Active Problems:   Acute blood loss anemia   Dysphagia, post-stroke   Controlled type 2 diabetes mellitus with hyperglycemia, without long-term current use of insulin ([name])   Labile blood glucose   Cognitive deficit, post-stroke   Labile blood pressure DVT prophylaxis UTI/enterococcus Hyperlipidemia  Discharged Condition: Stable  Significant Diagnostic Studies: Dg Chest Port 1 View  Result Date: 05/28/2019 CLINICAL DATA:  68 year old male with history of fever. EXAM: PORTABLE CHEST 1 VIEW COMPARISON:  Chest x-ray 04/02/2019. FINDINGS: Lung volumes are low. No consolidative airspace disease. No pleural effusions. No pneumothorax. No pulmonary nodule or mass noted. Pulmonary vasculature and the cardiomediastinal silhouette are within normal limits. Atherosclerotic calcifications in the thoracic aorta. IMPRESSION: 1.  No radiographic evidence of acute cardiopulmonary disease. 2. Aortic atherosclerosis. Electronically Signed   By: Vinnie Langton M.D.   On: 05/28/2019 16:10   Vas [place] Lower Extremity Venous (dvt)  Result Date: 06/04/2019  Lower Venous Study Indications: Swelling.  Limitations: Movement and unable to fully follow instructions. Comparison Study: No previous study for comparison. Performing Technologist: Toma Copier RVS  Examination Guidelines: A complete evaluation includes B-mode imaging, spectral Doppler, color Doppler, and power Doppler as needed of all accessible portions of each vessel. Bilateral testing is considered an integral part of a complete examination. Limited examinations for reoccurring indications may be performed as noted.   +---------+---------------+---------+-----------+----------+--------------+ RIGHT    CompressibilityPhasicitySpontaneityPropertiesThrombus Aging +---------+---------------+---------+-----------+----------+--------------+ CFV      Full           Yes      Yes                                 +---------+---------------+---------+-----------+----------+--------------+ SFJ      Full                                                        +---------+---------------+---------+-----------+----------+--------------+ FV Prox  Full           Yes      Yes                                 +---------+---------------+---------+-----------+----------+--------------+ FV Mid   Full                                                        +---------+---------------+---------+-----------+----------+--------------+ FV DistalFull           Yes      Yes                                 +---------+---------------+---------+-----------+----------+--------------+ PFV      Full           Yes      Yes                                 +---------+---------------+---------+-----------+----------+--------------+  POP      Full           Yes      Yes                                 +---------+---------------+---------+-----------+----------+--------------+ PTV      Full                                                        +---------+---------------+---------+-----------+----------+--------------+ PERO     Full                                                        +---------+---------------+---------+-----------+----------+--------------+   Right Technical Findings: Technically difficult due to movement  +---------+---------------+---------+-----------+----------+--------------+ LEFT     CompressibilityPhasicitySpontaneityPropertiesThrombus Aging +---------+---------------+---------+-----------+----------+--------------+ CFV      Full           Yes      Yes                                  +---------+---------------+---------+-----------+----------+--------------+ SFJ      Full                                                        +---------+---------------+---------+-----------+----------+--------------+ FV Prox  Full           Yes      Yes                                 +---------+---------------+---------+-----------+----------+--------------+ FV Mid   Full                                                        +---------+---------------+---------+-----------+----------+--------------+ FV DistalFull           Yes      Yes                                 +---------+---------------+---------+-----------+----------+--------------+ PFV      Full           Yes      Yes                                 +---------+---------------+---------+-----------+----------+--------------+ POP      Full           Yes      Yes                                 +---------+---------------+---------+-----------+----------+--------------+  PTV      Full                                                        +---------+---------------+---------+-----------+----------+--------------+ PERO     Full                                                        +---------+---------------+---------+-----------+----------+--------------+   Left Technical Findings: Technically difficult due to movement   Summary: Right: There is no evidence of deep vein thrombosis in the lower extremity. However, portions of this examination were limited- see technologist comments above. No cystic structure found in the popliteal fossa. Left: There is no evidence of deep vein thrombosis in the lower extremity. However, portions of this examination were limited- see technologist comments above. No cystic structure found in the popliteal fossa.  *See table(s) above for measurements and observations. Electronically signed by Harold Barban MD on 06/04/2019 at 4:08:08 PM.    Final      Labs:  Basic Metabolic Panel: Recent Labs  Lab 06/17/19 0610  CREATININE 0.69    CBC: Recent Labs  Lab 06/19/19 1153  WBC 5.3  HGB 12.1*  HCT 37.3*  MCV 95.4  PLT 295    CBG: Recent Labs  Lab 06/18/19 2058 06/19/19 0603 06/19/19 1208 06/19/19 1654 06/19/19 2133  GLUCAP 131* 97 76 106* 129*   Family history.  Mother with diabetes and hypertension.  Father with hyperlipidemia.  Denies colon cancer  Brief HPI:   Joe Perry is a 67 y.o. right-handed male with history of CVA 2017 as well as March 2020 placed on dual antiplatelet therapy and patient signed out AMA, hypertension, diabetes mellitus.  Per chart review lives with spouse.  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  Wife plans to assist on discharge.  Presented 03/10/2019 with left-sided weakness as well as altered mental status with right side gaze of left side neglect.  Noted systolic blood pressure in the 180s.  Chemistries unremarkable except glucose 242, COVID negative.  Cranial CT scan negative for acute changes.  Noted extensive chronic small vessel ischemic changes throughout the brain as well as old right frontal infarction.  CT angiogram of head and neck showed fetal type right PCA with proximal occlusion as well as 30 to 40% narrowing of the right ICA bulb.  Patient did not receive TPA.  Underwent successful mechanical thrombectomy of right posterior cerebral artery per interventional radiology.  Follow-up MRI showed ischemic infarction of the right posterior cerebral artery territory.  No hemorrhage or mass-effect.  Echocardiogram with ejection fraction of 123456 normal systolic function.  Hospital course noted seizure-like episodes left hemi-chorea and EEG completed 04/15/2019 showed no seizure activity.  Neurology consulted maintained on aspirin daily as well as Brilinta for CVA prophylaxis.  Subcutaneous Lovenox for DVT prophylaxis.  Speech therapy follow-up for confusion cognitive decline  placed on Aricept as well as scheduled Seroquel for bouts of restlessness and agitation.  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  Dysphasia #1 thin liquid diet.  On 05/28/2019 spiked a fever 102.3 urine study negative chest x-ray unremarkable blood cultures no growth.  Urine culture did show staph hemolyticus and enterococcus placed on Rocephin x5 days.  Patient was admitted for a comprehensive rehab program   Hospital Course: Joe Perry was admitted to rehab 06/03/2019 for inpatient therapies to consist of PT, ST and OT at least three hours five days a week. Past admission physiatrist, therapy team and rehab RN have worked together to provide customized collaborative inpatient rehab.  Pertaining to patient right large PCA infarction undergone IR revascularization as well as history of CVA March 2020 as well as May 2017.  Patient would follow-up with neurology services and remained on aspirin as well as Brilinta.  Subcutaneous Lovenox for DVT prophylaxis no bleeding episodes.  Mood stabilization with the use of Aricept with good results as well as Seroquel.  Patient was receiving a telemetry sitter initially for safety.  Blood pressure controlled on Norvasc and follow-up with PCP.  Blood sugars controlled hemoglobin A1c 9.8 Glucophage 500 mg twice daily and full diabetic teaching.  Patient did complete a 5-day course of Rocephin for enterococcus UTI.  Lipitor ongoing for hyperlipidemia.  He is tolerating a dysphagia #1 thin liquid diet.  Initial left upper extremity involuntary movement restlessness EEG was negative.   Blood pressures were monitored on TID basis and stable  Diabetes has been monitored with ac/hs CBG checks and SSI was use prn for tighter BS control.   He is  continent of bowel and bladder with routine toileting.  He/ has made gains during rehab stay and is attending therapies  He/ will continue to receive follow up therapies after discharge  Rehab course: During patient's stay in rehab weekly team conferences were held to monitor patient's progress, set goals and discuss barriers to discharge. At admission, patient required moderate assist stand pivot transfers, moderate assist ambulate 180 feet rolling walker, minimal guard supine to sit and sit to supine.  Set up upper body bathing max is lower body bathing minimal assist upper body dressing moderate assist lower body dressing moderate assist toilet transfers  Physical exam.  Blood pressure 115/68 pulse 80 temperature 98.4 respirations 18 oxygen saturation 98% room air Constitutional.  No distress HEENT Head.  Normocephalic and atraumatic Eyes.  Pupils round and reactive to light no discharge without nystagmus Neck.  Supple nontender normal range of motion no thyromegaly present Cardiovascular normal rate and rhythm exam reveals no friction rub or murmur heard Respiratory.  Effort normal no respiratory distress no wheezes no rails GI.  Soft no distention nontender without rebound Musculoskeletal normal range of motion no edema Neurological.  A cranial nerve deficit present he is alert oriented to self and hospital decreased insight and awareness follows simple commands.  Right gaze preference.  Content to the left with cueing.  Left field cut but inconsistent following cues on exam.  Right upper right lower extremity moving spontaneously at least 4 out of 5 left upper extremity 3 out of 5 and ataxic left lower extremity 2 out of 5 and ataxic  He/  has had improvement in activity tolerance, balance, postural control as well as ability to compensate for deficits. He/ has had improvement in functional use RUE/LUE  and RLE/LLE as well as improvement in awareness.  Working with energy conservation  techniques and family teaching.  Ambulates to the day room with  Rollator and contact-guard.  Participated in ambulation in day room with obstacles with wife providing cues.  Patient was overall contact-guard for ambulation with moderate cues for steering from wife.  Patient wife also practice stepping over threshold wife providing assistance for placing Rollator over threshold.  Patient bathing from seated position on shower chair with minimal guard and moderate cues for sequencing of tasks.  Patient exiting the bathroom and seated in wheelchair to don clothing items and set up for upper body self-care minimal assist lower body self-care.  Full teaching was completed in regards to patient's diet consistency.       Disposition: Discharge disposition: 01-Home or Self Care     Discharge to home   Diet: Dysphasia #1 thin liquids  Special Instructions: No driving smoking or alcohol  Medications at discharge. 1.  Tylenol as needed 2.  Norvasc 5 mg p.o. daily 3.  Lipitor 40 mg p.o. daily 4.  Aricept 10 mg p.o. nightly 5.  Glucophage 500 mg p.o. twice daily 6.  Seroquel 25 mg p.o. 3 times daily 7.  Senokot S1 tablet p.o. nightly 8.  Brilinta 90 mg twice daily  Discharge Instructions    Ambulatory referral to Neurology   Complete by: As directed    An appointment is requested in approximately 4 weeks large right PCA infarction   Ambulatory referral to Physical Medicine Rehab   Complete by: As directed    Moderate complexity follow-up 1 to 2 weeks right PCA infarction      Follow-up Information    Kirsteins, Luanna Salk, MD Follow up.   Specialty: Physical Medicine and Rehabilitation Why: Office to call for appointment Contact information: [place] [address] [phone]        Luanne Bras, MD Follow up.   Specialties: Interventional Radiology, Radiology Why: Call for appointment Contact information: [place]  [address] [phone]        Antony Blackbird, MD Follow up on 07/18/2019.   Specialty: Family Medicine Why: APPOINTMENT @ 2:50 PM Contact information: [place] [address] [phone]           Signed: Lavon Paganini [place] 06/20/2019, 5:10 AM

## 2019-06-18 NOTE — Patient Care Conference (Signed)
Inpatient RehabilitationTeam Conference and Plan of Care Update Date: 06/18/2019   Time: 10:40 AM    Patient Name: Joe Perry      Medical Record Number: [id number]  Date of Birth: 02/21/1951 Sex: Male         Room/Bed: 4W10C/4W10C-01 Payor Info: Payor: /    Admit Date/Time:  06/03/2019  2:59 PM  Primary Diagnosis:  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 [facility])  Patient Active Problem List   Diagnosis Date Noted  . Labile blood pressure   . Labile blood glucose   . Cognitive deficit, post-stroke   . Acute blood loss anemia   . Dysphagia, post-stroke   . Controlled type 2 diabetes mellitus with hyperglycemia, without long-term current use of insulin ([place])   . 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 ([place]) 06/03/2019  . 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 Dyslipidemia, goal LDL below 70 03/12/2019  . Dysphagia due to recent stroke 03/12/2019  . Overweight 03/12/2019  . 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 Abnormality of gait   . Spasticity   . Gait disturbance, post-stroke   . 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 CVA (cerebral infarction) 01/31/2016  . Type 2 diabetes mellitus without complication, without long-term current use of insulin ([place]) 01/31/2016  . Essential hypertension 01/31/2016  . History of CVA (cerebrovascular accident) 01/31/2016  . 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 Delice Lesch Social Worker Present: Ovidio Kin, LCSW Nurse Present: Rosita Fire, RN PT Present: Barrie Folk, PT;Rosita Dechalus, PTA OT Present: Darleen Crocker, OT SLP Present: Stormy Fabian, SLP PPS Coordinator present : Gunnar Fusi, SLP     Current Status/Progress Goal Weekly Team Focus  Bowel/Bladder   Patient is incont. of /B/Bme toileting in progress but continue to have difficulty in comprehending program, LBM docunemted 06/15/19, continue schefule stool softener and prn laxative orders  restore function of bladder/bowels  Continue to stress importance of time tolieting to staff and family, QS/PRN assessment , provife prn laxativew agents to restore function of bladder/bowels, Educate family on time toileting process   Swallow/Nutrition/ Hydration   Min-Supervision A dys 1 and thin, fluctuating oral holding/swallow inititation and motor function  Supervision with least restrictive diet  swallow strategies on current diet and education   ADL's   min A overall with self care tasks with min - mod cuing for safety awareness and sequencing, min - mod A functional transfers and ambulation with rollator  min A overall  family education for d/c, balance, ADL retraining, strength, cognition   Mobility   supervision bed mobility, CGA STS with cues, CGA to minA gait, CGA with heavy cues 4 stairs  min-CGA overall with LRAD  family edu   Communication   Min A multimodal (yes/no, gestures) , Mod-Max A naming and identifying objects  Min A for wants/needs  functional communication, expressing wants/needs at word/phrase level, comprehension of basic commands and education   Safety/Cognition/ Behavioral Observations  Mod-Min A basic ADLs  Min A for basic  basic problem solving and sustained attention, education   Pain   denies pain monitor and address  indication of pain or discomfort with ordered medications  painfree  QS/PRN assessment addressing patient/families concerns   Skin   Skin intact no apparent skin irritation, bruising or abrations noted  Maintain skin integrity  QS/PRN skin assessment      *See Care Plan and progress notes for  long and short-term goals.     Barriers to Discharge  Current Status/Progress Possible Resolutions Date Resolved   Nursing                  PT                    OT                  SLP                SW                Discharge Planning/Teaching Needs:  Wife has been here for family training and aware of pt's need for 24 hr care at DC. Working toward DC Friday, make sure wife can provide the care pt needs.  Family on-going this week   Team Discussion:  Wife here for family training ion preparation for DC Friday. Will go home on Dys 1 thin due to cognition issues trials of Dys 2 were not successful. Followed commands with MD this am. Current level min assist with multiple cues. Attention-cognition biggest issues. MD checking labs in am. Timed tolieting not as successful per nursing report.  Revisions to Treatment Plan:  DC 10/16    Medical Summary Current Status: Left-sided weakness with right gaze preference and left neglect secondary to right large PCA infarction status post IR revascularization occluded R PCOM and PCA as well as history of CVA in May 2017 and March 2020 Weekly Focus/Goal: Improve mobility, cognition, safety, HTN/DM, dysphagia  Barriers to Discharge: Medical stability;Nutrition means;Behavior   Possible Resolutions to Barriers: Therapies, optimize DM/HTN meds, advance diet as tolerated   Continued Need for Acute Rehabilitation Level of Care: The patient requires daily medical management by a physician with specialized training in physical medicine and rehabilitation for the following reasons: Direction of a multidisciplinary physical rehabilitation program to maximize functional independence : Yes Medical management of patient stability for increased activity during participation in an intensive rehabilitation regime.: Yes Analysis of laboratory values and/or radiology reports with any subsequent need for medication adjustment and/or medical intervention. :  Yes   I attest that I was present, lead the team conference, and concur with the assessment and plan of the team. Team conference was held via web/ teleconference due to [place], Gardiner Rhyme 06/18/2019, 3:22 PM

## 2019-06-18 NOTE — Plan of Care (Signed)
  Problem: Consults Goal: RH STROKE PATIENT EDUCATION Description: See Patient Education module for education specifics  Outcome: Progressing   Problem: RH BLADDER ELIMINATION Goal: RH STG MANAGE BLADDER WITH ASSISTANCE Description: STG Manage Bladder With min Assistance Outcome: Progressing   Problem: RH SAFETY Goal: RH STG ADHERE TO SAFETY PRECAUTIONS W/ASSISTANCE/DEVICE Description: STG Adhere to Safety Precautions With  min Assistance/Device. Outcome: Progressing   Problem: RH KNOWLEDGE DEFICIT Goal: RH STG INCREASE KNOWLEDGE OF DIABETES Description: Wife will be able to manage DM using medication, dietary restrictions and monitoring CBGs using handouts and educational resources independently Outcome: Progressing Goal: RH STG INCREASE KNOWLEDGE OF HYPERTENSION Description: Wife will be able to manage HTN using medication, dietary restrictions and monitoring BP using handouts and educational resources independently Outcome: Progressing Goal: RH STG INCREASE KNOWLEDGE OF DYSPHAGIA/FLUID INTAKE Description: Wife will be able to manage dysphagia , dietary restrictions  using handouts and educational resources independently Outcome: Progressing Goal: RH STG INCREASE KNOWLEGDE OF HYPERLIPIDEMIA Description: Wife will be able to manage HLD using medication, dietary restrictions  using handouts and educational resources independently Outcome: Progressing Goal: RH STG INCREASE KNOWLEDGE OF STROKE PROPHYLAXIS Description: Wife will be able state understanding of secondary stroke prevention using handouts and educational resources independently Outcome: Progressing   Problem: RH BOWEL ELIMINATION Goal: RH STG MANAGE BOWEL WITH ASSISTANCE Description: STG Manage Bowel with min Assistance. Outcome: Not Progressing  Pt has been timed toileted throughout shift. Prune juice was given and sorbitol prn per [facility] was given. Will reassess at later time to see if effective.  Erie Noe, RN

## 2019-06-18 NOTE — Progress Notes (Signed)
Occupational Therapy Discharge Summary  Patient Details  Name: Joe Perry MRN: [id number] Date of Birth: 1950/09/18    Patient has met 10 of 10 long term goals due to improved activity tolerance, improved balance, postural control, ability to compensate for deficits, functional use of  LEFT upper and LEFT lower extremity, improved attention, improved awareness and improved coordination.  Patient to discharge at [facility] Assist level.  Patient's care partner, [name] (Wife), is independent to provide the necessary physical and cognitive assistance at discharge.    Reasons goals not met: na   Recommendation:  Patient will benefit from ongoing skilled OT services in home health setting to continue to advance functional skills in the area of BADL and Reduce care partner burden.  Equipment: shower seat and 3 in 1 commode chair  Reasons for discharge: treatment goals met  Patient/family agrees with progress made and goals achieved: Yes  OT Discharge Precautions/Restrictions  Precautions Precautions: Fall Precaution Comments: Left lateral lean, Left hemianopsia, impulsive Vital Signs Therapy Vitals Temp: 97.6 F (36.4 C) Pulse Rate: 61 Resp: 18 BP: 123/69 Patient Position (if appropriate): Sitting Oxygen Therapy SpO2: 100 % O2 Device: Room Air Vision Baseline Vision/History: No visual deficits Patient Visual Report: No change from baseline Visual Fields: Left visual field deficit Perception  Perception: Impaired Inattention/Neglect: Does not attend to left side of body Cognition Overall Cognitive Status: Impaired/Different from baseline Arousal/Alertness: Awake/alert Orientation Level: Oriented to person;Oriented to place Sensation Sensation Light Touch: Appears Intact Hot/Cold: Appears Intact Proprioception: Appears Intact Coordination Gross Motor Movements are Fluid and Coordinated: No Fine Motor Movements are Fluid and Coordinated: No Motor  Motor Motor:  Ataxia;Hemiplegia Mobility  Bed Mobility Bed Mobility: Rolling Right;Rolling Left;Supine to Sit;Sit to Supine Rolling Right: Supervision/verbal cueing Rolling Left: Supervision/Verbal cueing Supine to Sit: Supervision/Verbal cueing Sit to Supine: Supervision/Verbal cueing Transfers Sit to Stand: Contact Guard/Touching assist Stand to Sit: Contact Guard/Touching assist;Minimal Assistance - Patient > 75%  Trunk/Postural Assessment  Cervical Assessment Cervical Assessment: Within Functional Limits Thoracic Assessment Thoracic Assessment: Within Functional Limits Lumbar Assessment Lumbar Assessment: Within Functional Limits Postural Control Postural Control: Deficits on evaluation  Balance Balance Balance Assessed: Yes Static Sitting Balance Static Sitting - Balance Support: Bilateral upper extremity supported Static Sitting - Level of Assistance: 5: Stand by assistance Dynamic Sitting Balance Dynamic Sitting - Level of Assistance: 5: Stand by assistance Static Standing Balance Static Standing - Balance Support: Bilateral upper extremity supported Static Standing - Level of Assistance: 4: Min assist Dynamic Standing Balance Dynamic Standing - Level of Assistance: 4: Min assist;3: Mod assist Extremity/Trunk Assessment RUE Assessment RUE Assessment: Within Functional Limits LUE Assessment LUE Assessment: Exceptions to [facility] Passive Range of Motion (PROM) Comments: WFLs Active Range of Motion (AROM) Comments: 3+/5 General Strength Comments: movements very ataxic but improved since evaluation   Darleen Crocker P 06/18/2019, 4:55 PM

## 2019-06-18 NOTE — Progress Notes (Signed)
Occupational Therapy Session Note  Patient Details  Name: Joe Perry MRN: [id number] Date of Birth: 03-Jun-1951  Today's Date: 06/18/2019 OT Individual Time: [id number] OT Individual Time Calculation (min): 70 min    Short Term Goals: Week 2:  OT Short Term Goal 1 (Week 2): STGs=LTGs secondary to upcoming discharge  Skilled Therapeutic Interventions/Progress Updates:    Upon entering the room, pt seated in wheelchair with NT present in room providing supervision for meal. OT providing supervision while pt finishing meal. Therapist having to scoop food from utensils as pt attempts to take large bites of food. Min cuing and increased time to swallow food as pt appearing to hold in mouth. Pt coughing once and food sprayed from his mouth secondary to holding. Pt standing from wheelchair and ambulating with rollator into bathroom for toileting needs. Pt having BM and able to perform clothing management and hygiene with min A for standing balance. Pt transitioned to sink for hand hygiene and grooming tasks with min A for standing balance. Pt needing mod cuing for sequencing and initiation of tasks. Pt seated in wheelchair to don clothing items with continued min cuing for sequencing and initiation. Pt remained seated in wheelchair at end of session with chair alarm belt donned and call bell within reach.  Therapy Documentation Precautions:  Precautions Precautions: Fall Precaution Comments: Left lateral lean, Left hemianopsia, impulsive Restrictions Weight Bearing Restrictions: No Vital Signs: Therapy Vitals Temp: 97.6 F (36.4 C) Pulse Rate: 61 Resp: 18 BP: 123/69 Patient Position (if appropriate): Sitting Oxygen Therapy SpO2: 100 % O2 Device: Room Air Vision Baseline Vision/History: No visual deficits Patient Visual Report: No change from baseline Visual Fields: Left visual field deficit Perception  Perception: Impaired Inattention/Neglect: Does not attend to left side of  body   Therapy/Group: Individual Therapy  Gypsy Decant 06/18/2019, 4:58 PM

## 2019-06-18 NOTE — Progress Notes (Signed)
Physical Therapy Session Note  Patient Details  Name: Joe Perry MRN: [id number] Date of Birth: [birth year]  Today's Date: 06/18/2019 PT Individual Time: 1105-1200 and 1400-1430 PT Individual Time Calculation (min): 55 min and 30 min  Short Term Goals: Week 2:  PT Short Term Goal 1 (Week 2): STG=LTG due to ELOS  Skilled Therapeutic Interventions/Progress Updates: Tx1: Pt presented in w/c with wife present agreeable to therapy. Session focused on continued hands on training with transfers and ambulation. Pt ambulated to day room with PTA with rollator and CGA. Pt then participated in ambulation in day room with obstacles with wife providing cues. Bolo required instruction to provide clear cues to pt as she would frequently say "this way" or "that way" with pt unsure of which direction to turn. Both pt and wife required cues in safe use of rollator as [name] would forget to cue pt to lock and unlock BOTH brakes. Pt was overall CGA for gait with mod cues for steering from wife. Pt and wife also practiced stepping over threshold wife wife providing assistance for placing rollator over threshold. Wife ambulated pt back to room and returned pt to w/c at end of session. Pt and wife left in room with belt alarm on, call bell within reach and needs met.   Tx2:  Pt presented in w/c with wife present agreeable to therapy. Pt ambulated to day room with rollator and CGA with moderate cues for turns. Pt participated in NuStep L3 x 7 min for general conditioning. Pt then participated in scanning activity for sequencial numbers with pt able to correctly name numbers 4,5,6,7 and able to locate with mod to max cues in room. Pt ambulated back to room at end of session and returned to w/c. Pt left with belt alarm on, call bell within reach and needs met.        Therapy Documentation Precautions:  Precautions Precautions: Fall Precaution Comments: Left lateral lean, Left hemianopsia,  impulsive Restrictions Weight Bearing Restrictions: No General:   Vital Signs:  Pain: Pain Assessment Pain Scale: 0-10 Pain Score: 0-No pain   Therapy/Group: Individual Therapy  Lizvet Chunn  Keionna Kinnaird, PTA  06/18/2019, 12:07 PM

## 2019-06-18 NOTE — Progress Notes (Signed)
Speech Language Pathology Daily Session Note  Patient Details  Name: [name] MRN: [id number] Date of Birth: 1950/12/07  Today's Date: 06/18/2019 SLP Individual Time: 1300-1355 SLP Individual Time Calculation (min): 55 min  Short Term Goals: Week 2: SLP Short Term Goal 1 (Week 2): STG=LTG due to short ELOS  Skilled Therapeutic Interventions: Skilled treatment session focused on completion of patient and family education with the patient's wife. Upon arrival, patient was upright in the wheelchair eating his lunch meal of Dys. 1 textures with thin liquids with assistance from wife. Patient' wife was providing appropriate cueing for a slow rate, initiation of self-feeding and for bolus size. Patient consumed meal without overt s/s of aspiration. SLP also provided handouts in regards to appropriate textures and how to appropriately blend textures ( per request). SLP also facilitated session by providing education in regards to how to approipriately cue patient for naming and how to increase overall verbal expression during funcitonal tasks.  She verbalized understanding but required increased demonstration for appropriate sentence completion cues. Patient left upright in wheelchair with wife present. Continue with current plan of care.      Pain No/Denies Pain   Therapy/Group: Individual Therapy  Joe Perry 06/18/2019, 3:26 PM

## 2019-06-18 NOTE — Progress Notes (Addendum)
[place] PHYSICAL MEDICINE & REHABILITATION PROGRESS NOTE   Subjective/Complaints: Patient seen sitting up in bed this AM.  No reported issues overnight.  He is able to follow commands better this AM, with self-correction.    ROS- unable to obtain due to cognition.   Objective:   No results found. No results for input(s): WBC, HGB, HCT, PLT in the last 72 hours. Recent Labs    06/17/19 0610  CREATININE 0.69    Intake/Output Summary (Last 24 hours) at 06/18/2019 0905 Last data filed at 06/18/2019 [id number] Gross per 24 hour  Intake 399 ml  Output 200 ml  Net 199 ml     Physical Exam: Vital Signs Blood pressure (!) 144/68, pulse 63, temperature 98 F (36.7 C), temperature source Oral, resp. rate 15, weight 79.1 kg, SpO2 100 %. Constitutional: No distress . Vital signs reviewed. HENT: Normocephalic.  Atraumatic. Eyes: EOMI. No discharge. Cardiovascular: No JVD. Respiratory: Normal effort.  No stridor. GI: Non-distended. Skin: Warm and dry.  Intact. Psych: Unable to assess due to cognition Musc: No edema in extremities.  No tenderness in extremities. Neurologic: Alert Motor: Limited due to ability to follow commands, but appears to be: LUE:  5/5 proximal to distal LLE: 4-4+/5 proximal to distal RUE: 4+/5 proximal to distal RLE: 4-/5 proximal to distal  Dysarthria  Assessment/Plan: 1. Functional deficits secondary to RIght PCA infarct with left hemiataxia which require 3+ hours per day of interdisciplinary therapy in a comprehensive inpatient rehab setting.  Physiatrist is providing close team supervision and 24 hour management of active medical problems listed below.  Physiatrist and rehab team continue to assess barriers to discharge/monitor patient progress toward functional and medical goals  Care Tool:  Bathing  Bathing activity did not occur: Refused Body parts bathed by patient: Right arm, Left arm, Chest, Abdomen, Front perineal area, Buttocks, Face, Right  upper leg, Left upper leg, Right lower leg, Left lower leg   Body parts bathed by helper: Right arm, Left arm, Buttocks, Right lower leg, Left lower leg, Left upper leg Body parts n/a: Right lower leg, Left lower leg   Bathing assist Assist Level: Contact Guard/Touching assist     Upper Body Dressing/Undressing Upper body dressing   What is the patient wearing?: Button up shirt    Upper body assist Assist Level: Supervision/Verbal cueing    Lower Body Dressing/Undressing Lower body dressing      What is the patient wearing?: Underwear/pull up, Pants     Lower body assist Assist for lower body dressing: Minimal Assistance - Patient > 75%     Toileting Toileting    Toileting assist Assist for toileting: Minimal Assistance - Patient > 75%     Transfers Chair/bed transfer  Transfers assist     Chair/bed transfer assist level: Contact Guard/Touching assist     Locomotion Ambulation   Ambulation assist      Assist level: Minimal Assistance - Patient > 75% Assistive device: Rollator Max distance: 150'   Walk 10 feet activity   Assist     Assist level: Minimal Assistance - Patient > 75% Assistive device: Rollator   Walk 50 feet activity   Assist    Assist level: Minimal Assistance - Patient > 75% Assistive device: Rollator    Walk 150 feet activity   Assist Walk 150 feet activity did not occur: Safety/medical concerns  Assist level: Minimal Assistance - Patient > 75% Assistive device: Rollator    Walk 10 feet on uneven surface  activity  Assist Walk 10 feet on uneven surfaces activity did not occur: Safety/medical concerns         Wheelchair     Assist Will patient use wheelchair at discharge?: Yes Type of Wheelchair: Manual    Wheelchair assist level: Moderate Assistance - Patient 50 - 74% Max wheelchair distance: 75    Wheelchair 50 feet with 2 turns activity    Assist        Assist Level: Moderate Assistance -  Patient 50 - 74%   Wheelchair 150 feet activity     Assist  Wheelchair 150 feet activity did not occur: Safety/medical concerns       Blood pressure (!) 144/68, pulse 63, temperature 98 F (36.7 C), temperature source Oral, resp. rate 15, weight 79.1 kg, SpO2 100 %.  Medical Problem List and Plan: 1.Left-sided weakness with right gaze preference and left neglectsecondary to right large PCA infarction status post IR revascularization occludedR PCOM and PCAas well as history of CVA in May 2017 and March 2020  Cont CIR  Cognition ?improving  Team conference today to discuss current and goals and coordination of care, home and environmental barriers, and discharge planning with nursing, case manager, and therapies.  2. Antithrombotics: -DVT/anticoagulation:Lovenox, Cr WNL on 10/13             -venous dopplers negative for DVT -antiplatelet therapy: Aspirin 81 mg daily, Brilinta 90 mg twice daily 3. Pain Management:Tylenol as needed 4. Mood:Aricept 10 mg nightly, -antipsychotic agents: seroquel 25mg  tid -try to reduce to limit day time sedation -start sleep remains pending 5. Neuropsych: This patientis not capable of making decisions on hisown behalf due to post-stroke cognitive deficits  Cont tele-sitter for safety 6. Skin/Wound Care:routine care, skin checks. Improve nutrition 7. Fluids/Electrolytes/Nutrition:Routine in and outs 8. Hypertension. Norvasc 5 mg daily. Monitor with increased mobility Vitals:   06/17/19 1925 06/18/19 0309  BP: 118/77 (!) 144/68  Pulse: 86 63  Resp: 18 15  Temp: 98 F (36.7 C) 98 F (36.7 C)  SpO2: 100% 100%   Labile on 10/14, cont to monitor, attempt to avoid hypoperfusion 9. Diabetes mellitus with hyperglycemia. Hemoglobin A1c 9.8.  -continue glucophage 500 mg twice daily. -cbg's ac and hs -titrate regimen as needed CBG (last  3)  Recent Labs    06/17/19 1733 06/17/19 2102 06/18/19 0636  GLUCAP 135* 130* 85   Slightly labile on 10/14 10. UTI/enterococcusfaecalis. Completed 5-day course of Rocephin 11. Hyperlipidemia. 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   D1 thins  Follow-up speech therapy and advance as tolerated 13. 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 Nose bleeds/epistaxis  -Resolved.   15.  Acute blood loss anemia  Hemoglobin 11.6 on 9/30, labs ordered for tomorrow  LOS: 15 days A FACE TO FACE EVALUATION WAS PERFORMED  Ankit Lorie Phenix 06/18/2019, 9:05 AM

## 2019-06-19 ENCOUNTER — Inpatient Hospital Stay (HOSPITAL_COMMUNITY): Payer: Self-pay | Admitting: Physical Therapy

## 2019-06-19 ENCOUNTER — Inpatient Hospital Stay (HOSPITAL_COMMUNITY): Payer: Self-pay | Admitting: Occupational Therapy

## 2019-06-19 ENCOUNTER — Inpatient Hospital Stay (HOSPITAL_COMMUNITY): Payer: Self-pay | Admitting: Speech Pathology

## 2019-06-19 LAB — GLUCOSE, CAPILLARY
Glucose-Capillary: 97 mg/dL (ref 70–99)
Glucose-Capillary: 76 mg/dL (ref 70–99)
Glucose-Capillary: 106 mg/dL — ABNORMAL HIGH (ref 70–99)
Glucose-Capillary: 129 mg/dL — ABNORMAL HIGH (ref 70–99)

## 2019-06-19 LAB — CBC
HCT: 37.3 % — ABNORMAL LOW (ref 39.0–52.0)
Hemoglobin: 12.1 g/dL — ABNORMAL LOW (ref 13.0–17.0)
MCH: 30.9 pg (ref 26.0–34.0)
MCHC: 32.4 g/dL (ref 30.0–36.0)
MCV: 95.4 fL (ref 80.0–100.0)
Platelets: 295 10*3/uL (ref 150–400)
RBC: 3.91 MIL/uL — ABNORMAL LOW (ref 4.22–5.81)
RDW: 13.3 % (ref 11.5–15.5)
WBC: 5.3 10*3/uL (ref 4.0–10.5)
nRBC: 0 % (ref 0.0–0.2)

## 2019-06-19 MED ORDER — METFORMIN HCL 500 MG PO TABS: 500.0000 mg | ORAL_TABLET | Freq: Two times a day (BID) | ORAL | 0 refills | Status: DC

## 2019-06-19 MED ORDER — AMLODIPINE BESYLATE 5 MG PO TABS: 5.0000 mg | ORAL_TABLET | Freq: Every day | ORAL | 0 refills | Status: DC

## 2019-06-19 MED ORDER — QUETIAPINE FUMARATE 25 MG PO TABS: 25.0000 mg | ORAL_TABLET | Freq: Three times a day (TID) | ORAL | 0 refills | Status: DC

## 2019-06-19 MED ORDER — ACETAMINOPHEN 325 MG PO TABS: 650.0000 mg | ORAL_TABLET | ORAL | Status: AC | PRN

## 2019-06-19 MED ORDER — TICAGRELOR 90 MG PO TABS: 90.0000 mg | ORAL_TABLET | Freq: Two times a day (BID) | ORAL | 0 refills | Status: DC

## 2019-06-19 MED ORDER — INFLUENZA VAC A&B SA ADJ QUAD 0.5 ML IM PRSY
0.5000 mL | PREFILLED_SYRINGE | INTRAMUSCULAR | Status: AC
  Administered 2019-06-20: 11:00:00 0.5 mL via INTRAMUSCULAR
  Filled 2019-06-19: qty 0.5

## 2019-06-19 MED ORDER — ATORVASTATIN CALCIUM 40 MG PO TABS: 40.0000 mg | ORAL_TABLET | Freq: Every day | ORAL | 0 refills | Status: DC

## 2019-06-19 MED ORDER — DONEPEZIL HCL 10 MG PO TABS: 10.0000 mg | ORAL_TABLET | Freq: Every day | ORAL | 0 refills | Status: DC

## 2019-06-19 NOTE — Progress Notes (Signed)
Social Work Patient ID: Joe Perry, male   DOB: June 30, 1951, 68 y.o.   MRN: [id number]  Met with wife to inform of team conference goals and readiness for discharge tomorrow. She acted surprised, discussed this was the reason for education this week to prepare for discharge home. Hopefully she will show up tomorrow for discharge.

## 2019-06-19 NOTE — Progress Notes (Signed)
[place] PHYSICAL MEDICINE & REHABILITATION PROGRESS NOTE   Subjective/Complaints: Patient seen laying in bed this morning.  No reported issues overnight.  Still has difficulty following commands, but overall improving  ROS- unable to obtain due to cognition, but appears denies CP, shortness of breath, nausea, vomiting, diarrhea..   Objective:   No results found. No results for input(s): WBC, HGB, HCT, PLT in the last 72 hours. Recent Labs    06/17/19 0610  CREATININE 0.69    Intake/Output Summary (Last 24 hours) at 06/19/2019 0920 Last data filed at 06/18/2019 1335 Gross per 24 hour  Intake 177 ml  Output -  Net 177 ml     Physical Exam: Vital Signs Blood pressure 123/63, pulse 60, temperature 98.5 F (36.9 C), temperature source Oral, resp. rate 15, weight 79.1 kg, SpO2 100 %. Constitutional: No distress . Vital signs reviewed. HENT: Normocephalic.  Atraumatic. Eyes: EOMI. No discharge. Cardiovascular: No JVD. Respiratory: Normal effort.  No stridor. GI: Non-distended. Skin: Warm and dry.  Intact. Psych: Unable to assess due to cognition  Musc: No edema in extremities.  No tenderness in extremities. Neurologic: Alert Motor: Limited due to ability to follow commands, but appears to be: LUE:  5/5 proximal to distal LLE: 4-4+/5 proximal to distal, unchanged RUE: 4-4+/5 proximal to distal RLE: 4/5 proximal to distal  Dysarthria  Assessment/Plan: 1. Functional deficits secondary to RIght PCA infarct with left hemiataxia which require 3+ hours per day of interdisciplinary therapy in a comprehensive inpatient rehab setting.  Physiatrist is providing close team supervision and 24 hour management of active medical problems listed below.  Physiatrist and rehab team continue to assess barriers to discharge/monitor patient progress toward functional and medical goals  Care Tool:  Bathing  Bathing activity did not occur: Refused Body parts bathed by patient: Right  arm, Left arm, Chest, Abdomen, Front perineal area, Buttocks, Face, Right upper leg, Left upper leg, Right lower leg, Left lower leg   Body parts bathed by helper: Right arm, Left arm, Buttocks, Right lower leg, Left lower leg, Left upper leg Body parts n/a: Right lower leg, Left lower leg   Bathing assist Assist Level: Contact Guard/Touching assist     Upper Body Dressing/Undressing Upper body dressing   What is the patient wearing?: Pull over shirt    Upper body assist Assist Level: Supervision/Verbal cueing    Lower Body Dressing/Undressing Lower body dressing      What is the patient wearing?: Underwear/pull up, Pants     Lower body assist Assist for lower body dressing: Minimal Assistance - Patient > 75%     Toileting Toileting    Toileting assist Assist for toileting: Minimal Assistance - Patient > 75%     Transfers Chair/bed transfer  Transfers assist     Chair/bed transfer assist level: Contact Guard/Touching assist     Locomotion Ambulation   Ambulation assist      Assist level: Minimal Assistance - Patient > 75% Assistive device: Rollator Max distance: 150'   Walk 10 feet activity   Assist     Assist level: Minimal Assistance - Patient > 75% Assistive device: Rollator   Walk 50 feet activity   Assist    Assist level: Minimal Assistance - Patient > 75% Assistive device: Rollator    Walk 150 feet activity   Assist Walk 150 feet activity did not occur: Safety/medical concerns  Assist level: Minimal Assistance - Patient > 75% Assistive device: Rollator    Walk 10 feet on uneven  surface  activity   Assist Walk 10 feet on uneven surfaces activity did not occur: Safety/medical concerns         Wheelchair     Assist Will patient use wheelchair at discharge?: Yes Type of Wheelchair: Manual    Wheelchair assist level: Moderate Assistance - Patient 50 - 74% Max wheelchair distance: 75    Wheelchair 50 feet with 2  turns activity    Assist        Assist Level: Moderate Assistance - Patient 50 - 74%   Wheelchair 150 feet activity     Assist  Wheelchair 150 feet activity did not occur: Safety/medical concerns       Blood pressure 123/63, pulse 60, temperature 98.5 F (36.9 C), temperature source Oral, resp. rate 15, weight 79.1 kg, SpO2 100 %.  Medical Problem List and Plan: 1.Left-sided weakness with right gaze preference and left neglectsecondary to right large PCA infarction status post IR revascularization occludedR PCOM and PCAas well as history of CVA in May 2017 and March 2020  Continue CIR  Cognition gradually 2. Antithrombotics: -DVT/anticoagulation:Lovenox, Cr WNL on 10/13             -venous dopplers negative for DVT -antiplatelet therapy: Aspirin 81 mg daily, Brilinta 90 mg twice daily 3. Pain Management:Tylenol as needed 4. Mood:Aricept 10 mg nightly, -antipsychotic agents: seroquel 25mg  tid -try to reduce to limit day time sedation 5. Neuropsych: This patientis not capable of making decisions on hisown behalf due to post-stroke cognitive deficits  Continue tele-sitter for safety 6. Skin/Wound Care:routine care, skin checks. Improve nutrition 7. Fluids/Electrolytes/Nutrition:Routine in and outs 8. Hypertension. Norvasc 5 mg daily. Monitor with increased mobility Vitals:   06/18/19 2023 06/19/19 0524  BP: (!) 120/98 123/63  Pulse: 76 60  Resp: 16 15  Temp: 98.3 F (36.8 C) 98.5 F (36.9 C)  SpO2: 98% 100%   Controlled on 10/15 9. Diabetes mellitus with hyperglycemia. Hemoglobin A1c 9.8.  -continue glucophage 500 mg twice daily. -cbg's ac and hs -titrate regimen as needed CBG (last 3)  Recent Labs    06/18/19 1703 06/18/19 2058 06/19/19 0603  GLUCAP 109* 131* 97   Slightly labile on 10/15, but overall controlled, avoid hypoglycemia 10.  UTI/enterococcusfaecalis. Completed 5-day course of Rocephin 11. Hyperlipidemia. 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   D1 thins, will go home on his diet  Follow-up speech therapy and advance as tolerated 13. 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 Nose bleeds/epistaxis  -Resolved.   15.  Acute blood loss anemia  Hemoglobin 11.6 on 9/30, labs pending  LOS: 16 days A FACE TO [place] 06/19/2019, 9:20 AM

## 2019-06-19 NOTE — Discharge Instructions (Signed)
Inpatient Rehab Discharge Instructions  Joe Perry Discharge date and time: No discharge date for patient encounter.   Activities/Precautions/ Functional Status: Activity: activity as tolerated Diet: dysphagia #1.Medicine crushed in puree Wound Care: none needed Functional status:  ___ No restrictions     ___ Walk up steps independently ___ 24/7 supervision/assistance   ___ Walk up steps with assistance ___ Intermittent supervision/assistance  ___ Bathe/dress independently ___ Walk with walker     _x__ Bathe/dress with assistance ___ Walk Independently    ___ Shower independently ___ Walk with assistance    ___ Shower with assistance ___ No alcohol     ___ Return to work/school ________  Special Instructions: No driving smoking or alcohol    COMMUNITY REFERRALS UPON DISCHARGE:    Home Health:   PT, OT, SPT     [place]   Date of last service:06/20/2019  Medical Equipment/Items Ordered:ROLLATOR ROLLING WALKER,  3 IN 1 AND TUB SEAT  Agency/Supplier:[facility]   [phone]  Other:MEDICAID AND SSD PENDING WIFE WORKING ON WITH FINANCIAL COUNSELOR-[name] [phone] MATCH GIVEN TO ASSIST WITH MEDICATION COST AND SET UP AT [place] Cigarette smoking nearly doubles your risk of having a stroke & is the single most alterable risk factor  If you smoke or have smoked in the last 12 months, you are advised to quit smoking for your health.  Most of the excess cardiovascular risk related to smoking disappears within a year of stopping.  Ask you doctor about anti-smoking medications  [place] Quit Line: 1-800-QUIT NOW  Free Smoking Cessation Classes [phone]  CHOLESTEROL Know your levels; limit fat & cholesterol in your diet  Lipid Panel     Component Value Date/Time   CHOL 159 03/11/2019 0946   TRIG 67 03/11/2019 0946   HDL 45 03/11/2019 0946   CHOLHDL 3.5  03/11/2019 0946   VLDL 13 03/11/2019 0946   LDLCALC 101 (H) 03/11/2019 0946      Many patients benefit from treatment even if their cholesterol is at goal.  Goal: Total Cholesterol (CHOL) less than 160  Goal:  Triglycerides (TRIG) less than 150  Goal:  HDL greater than 40  Goal:  LDL (LDLCALC) less than 100   BLOOD PRESSURE American Stroke Association blood pressure target is less that 120/80 mm/Hg  Your discharge blood pressure is:  BP: 128/73  Monitor your blood pressure  Limit your salt and alcohol intake  Many individuals will require more than one medication for high blood pressure  DIABETES (A1c is a blood sugar average for last 3 months) Goal HGBA1c is under 7% (HBGA1c is blood sugar average for last 3 months)  Diabetes:    Lab Results  Component Value Date   HGBA1C 9.8 (H) 03/11/2019     Your HGBA1c can be lowered with medications, healthy diet, and exercise.  Check your blood sugar as directed by your physician  Call your physician if you experience unexplained or low blood sugars.  PHYSICAL ACTIVITY/REHABILITATION Goal is 30 minutes at least 4 days per week  Activity: Increase activity slowly, Therapies: Physical Therapy: Home Health Return to work:   Activity decreases your risk of heart attack and stroke and makes your heart stronger.  It helps control your weight and blood pressure; helps you relax and can improve your mood.  Participate in a regular exercise program.  Talk with your doctor about the best form of exercise for you (dancing, walking,  swimming, cycling).  DIET/WEIGHT Goal is to maintain a healthy weight  Your discharge diet is:  Diet Order            DIET - DYS 1 Room service appropriate? No; Fluid consistency: Thin  Diet effective now              liquids Your height is:    Your current weight is:   Your Body Mass Index (BMI) is:     Following the type of diet specifically designed for you will help prevent another stroke.  Your  goal weight range is:    Your goal Body Mass Index (BMI) is 19-24.  Healthy food habits can help reduce 3 risk factors for stroke:  High cholesterol, hypertension, and excess weight.  RESOURCES Stroke/Support Group:  Call [phone]   STROKE EDUCATION PROVIDED/REVIEWED AND GIVEN TO PATIENT Stroke warning signs and symptoms How to activate emergency medical system (call 911). Medications prescribed at discharge. Need for follow-up after discharge. Personal risk factors for stroke. Pneumonia vaccine given:  Flu vaccine given:  My questions have been answered, the writing is legible, and I understand these instructions.  I will adhere to these goals & educational materials that have been provided to me after my discharge from the hospital.      My questions have been answered and I understand these instructions. I will adhere to these goals and the provided educational materials after my discharge from the hospital.  Patient/Caregiver Signature _______________________________ Date __________  Clinician Signature _______________________________________ Date __________  Please bring this form and your medication list with you to all your follow-up doctor's appointments.

## 2019-06-19 NOTE — Progress Notes (Signed)
Occupational Therapy Session Note  Patient Details  Name: Joe Perry MRN: [id number] Date of Birth: [birth year]  Today's Date: 06/19/2019 OT Individual Time: 1300-1415 OT Individual Time Calculation (min): 75 min    Skilled Therapeutic Interventions/Progress Updates:    Patient seated in w/c, finishing lunch with CS and cues for bite size.  Ambulation and functional transfers with rollator to/from w/c, shower seat, chair without arms and recliner with min A for balance, cues for rollator breaks, cues for direction/left side awareness and hand placement.  Completed shower with CS, cues for sequencing and thoroughness.  Dressing completed seated edge of bed - LB pull up and pants with CG/min A, OH shirt with CS and cues for proper placement.  Patient is oriented to self and name of hospital with choice of 2, not oriented to month, year or current events.  Standing visual scanning and reach activity completed with min A to maintain balance and max cues for locating/matching objects on the left side.  He returned to room, seated in recliner with seat belt alarm set and call bell in reach.    Therapy Documentation Precautions:  Precautions Precautions: Fall Precaution Comments: Left lateral lean, Left hemianopsia, impulsive Restrictions Weight Bearing Restrictions: No General:   Vital Signs: Therapy Vitals Temp: 98.5 F (36.9 C) Pulse Rate: 76 Resp: 15 BP: 126/77 Patient Position (if appropriate): Lying Oxygen Therapy SpO2: 100 % O2 Device: Room Air Pain: Pain Assessment Pain Scale: 0-10 Pain Score: 0-No pain Other Treatments:     Therapy/Group: Individual Therapy  Carlos Levering 06/19/2019, 3:32 PM

## 2019-06-19 NOTE — Progress Notes (Signed)
Physical Therapy Discharge Summary  Patient Details  Name: Joe Perry MRN: [id number] Date of Birth: 1951/04/19  Today's Date: 06/19/2019 PT Individual Time: 1000-1110 PT Individual Time Calculation (min): 70 min    Patient has met 12 of 12 long term goals due to improved activity tolerance, improved balance, improved postural control, increased strength, increased range of motion, ability to compensate for deficits, functional use of  left upper extremity and left lower extremity, improved attention, improved awareness and improved coordination.  Patient to discharge at an ambulatory level Supervision.   Patient's care partner is independent to provide the necessary physical and cognitive assistance at discharge.  Reasons goals not met: All PT goals met.   Recommendation:  Patient will benefit from ongoing skilled PT services in home health setting to continue to advance safe functional mobility, address ongoing impairments in balance, safety, gait, transfers, awareness, problem solving, and minimize fall risk.  Equipment: Rollator  Reasons for discharge: treatment goals met and discharge from hospital  Patient/family agrees with progress made and goals achieved: Yes   PT Treatment Pt received sitting in WC and agreeable to PT. Pt transported to bathroom in [facility] with min-CGA and rollator to attempt toiletting. Unable to void. PT assisted pt to don pants and shift sitting on toilet with mod assist. PT instructed pt in Grad day assessment to measure progress toward goals. See below for details. Car transfer with CGA to control RW. Pt ambulated >239f x 3 with CGA to control RW and gait speed. Patient demonstrates increased fall risk as noted by score of   18/56 on Berg Balance Scale.  (<36= high risk for falls, close to 100%; 37-45 significant >80%; 46-51 moderate >50%; 52-55 lower >25%) [facility] and all needs met.        PT Discharge Precautions/Restrictions Fall Pain   denies Vision/Perception  Perception Perception: Impaired Inattention/Neglect: Does not attend to left side of body;Does not attend to left visual field Praxis Praxis: Impaired Praxis Impairment Details: Ideomotor;Perseveration  Cognition  impaired  Sensation Sensation Light Touch: Appears Intact Proprioception: Impaired by gross assessment Additional Comments: mild Proprioceptive deficits UE>LE Coordination Gross Motor Movements are Fluid and Coordinated: No Fine Motor Movements are Fluid and Coordinated: No Coordination and Movement Description: mild impairments in BUE LUE>RUE Finger Nose Finger Test: decreased speed BLE LLE>RLE Motor  Motor Motor: Ataxia;Hemiplegia Motor - Discharge Observations: greatly improved from eval,  Mobility Bed Mobility Bed Mobility: Rolling Right;Rolling Left;Sit to Supine;Supine to Sit Rolling Right: Supervision/verbal cueing Rolling Left: Supervision/Verbal cueing Supine to Sit: Supervision/Verbal cueing Sit to Supine: Supervision/Verbal cueing Transfers Transfers: Sit to Stand;Stand to Sit;Stand Pivot Transfers Sit to Stand: Supervision/Verbal cueing Stand to Sit: Supervision/Verbal cueing Stand Pivot Transfers: Contact Guard/Touching assist Stand Pivot Transfer Details: Verbal cues for technique;Verbal cues for precautions/safety;Verbal cues for safe use of DME/AE Transfer (Assistive device): Rollator Locomotion  Gait Ambulation: Yes Gait Assistance: Contact Guard/Touching assist Assistive device: Rolling walker Gait Assistance Details: Tactile cues for placement;Tactile cues for posture;Visual cues/gestures for precautions/safety;Verbal cues for precautions/safety;Verbal cues for safe use of DME/AE Gait Gait: Yes Gait Pattern: Impaired Gait Pattern: Ataxic;Narrow base of support Gait velocity: intermittentlty excessively fast with cues toi decrease speed Stairs /  Additional Locomotion Stairs: Yes Stairs Assistance: Contact Guard/Touching assist Stair Management Technique: Two rails Number of Stairs: 12 Height of Stairs: 6 Wheelchair Mobility Wheelchair Mobility: No  Trunk/Postural Assessment  Cervical Assessment Cervical Assessment: Within Functional Limits  Thoracic Assessment Thoracic Assessment: Within Functional Limits Lumbar Assessment Lumbar Assessment: Within Functional Limits Postural Control Postural Control: Deficits on evaluation Trunk Control: mild R and L LOB in standing with functional tasks L>R  Balance Standardized Balance Assessment Standardized Balance Assessment: Berg Balance Test Berg Balance Test Sit to Stand: Able to stand using hands after several tries Standing Unsupported: Needs several tries to stand 30 seconds unsupported Sitting with Back Unsupported but Feet Supported on Floor or Stool: Able to sit safely and securely 2 minutes Stand to Sit: Uses backs of legs against chair to control descent Transfers: Needs one person to assist Standing Unsupported with Eyes Closed: Able to stand 10 seconds with supervision Standing Ubsupported with Feet Together: Needs help to attain position and unable to hold for 15 seconds From Standing, Reach Forward with Outstretched Arm: Can reach forward >5 cm safely (2") From Standing Position, Pick up Object from Floor: Unable to try/needs assist to keep balance From Standing Position, Turn to Look Behind Over each Shoulder: Needs supervision when turning Turn 360 Degrees: Needs assistance while turning Standing Unsupported, Alternately Place Feet on Step/Stool: Needs assistance to keep from falling or unable to try Standing Unsupported, One Foot in Front: Able to take small step independently and hold 30 seconds Standing on One Leg: Unable to try or needs assist to prevent fall Total Score: 18 Static Sitting Balance Static Sitting - Balance Support: No upper extremity  supported Static Sitting - Level of Assistance: 6: Modified independent (Device/Increase time) Dynamic Sitting Balance Dynamic Sitting - Balance Support: No upper extremity supported Dynamic Sitting - Level of Assistance: 5: Stand by assistance Static Standing Balance Static Standing - Balance Support: No upper extremity supported Static Standing - Level of Assistance: 5: Stand by assistance Dynamic Standing Balance Dynamic Standing - Balance Support: Bilateral upper extremity supported Dynamic Standing - Level of Assistance: 4: Min assist Extremity Assessment      RLE Assessment RLE Assessment: Within Functional Limits General Strength Comments: grossly 4+/5 LLE Assessment LLE Assessment: Within Functional Limits General Strength Comments: grossly 4-/5    Lorie Phenix 06/19/2019, 3:19 PM

## 2019-06-19 NOTE — Progress Notes (Signed)
Speech Language Pathology Discharge Summary  Patient Details  Name: Joe Perry MRN: [id number] Date of Birth: 1951/06/30  Today's Date: 06/19/2019 SLP Individual Time: 0827-0930 SLP Individual Time Calculation (min): 63 min   Skilled Therapeutic Interventions:  Skilled treatment focused on communication. SLP facilitated sesison by providing Max A multimodal cues for MIT targeting favorite well-rehearsed hymns. Pt able to sing in unison with SLP utilizing good intelligibility and words for complete verse of Amazing [place] and [place] Me. Pt able to imitate phrase "How are you?" when utilizing MIT. However, pt unable to utilize MIT to complete phrases. Pt also inconsistent with yes/no responses. 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 Pt left upright in wheelchair, lap belt alarm on and all needs within reach.    Patient has met 7 of 7 long term goals.  Patient to discharge at overall Min;Mod level.  Reasons goals not met:  N/A   Clinical Impression/Discharge Summary:    Pt has made little progress while participating in therapy across CIR. Pt continues to be safest on Dysphagia 1 diet with thin liquids d/t difficulty initiating motor movements during self-feeding, oral holding and delayed swallow initiation. He needs full supervision during meals and cues to take small bites/sips, clear left cheek and to initiate swallow. Additionally, he exhibits severe communication and cognitive deficits. He is able to answer simple yes/no questions to express wants/needs, although can be inconsistent. It is helpful to provide visuals. He has automatic language ability to respond to greetings and simple conversation exchanges at phrase level. He has difficulty initiating thoughts verbally, reading at word level, following commands and naming common objects. He has reduced sustained attention, motor impairments in  initiation of action, reduced problem solving, reduced short term memory, reduced safety awareness/impulsive and reduced ability to scan left due to inattention. He requires 24 hour supervision with all education completed with wife on current diet and safety issues.   Care Partner:  Caregiver Able to Provide Assistance: Yes  Type of Caregiver Assistance: Physical;Cognitive  Recommendation:  Home Health SLP;24 hour supervision/assistance  Rationale for SLP Follow Up: Maximize functional communication;Maximize cognitive function and independence;Maximize swallowing safety;Reduce caregiver burden   Equipment:   N/A  Reasons for discharge: Discharged from hospital   Patient/Family Agrees with Progress Made and Goals Achieved: Yes    Keagon Glascoe 06/19/2019, 10:28 AM

## 2019-06-20 DIAGNOSIS — I10 Essential (primary) hypertension: Secondary | ICD-10-CM

## 2019-06-20 LAB — GLUCOSE, CAPILLARY: Glucose-Capillary: 102 mg/dL — ABNORMAL HIGH (ref 70–99)

## 2019-06-20 NOTE — Progress Notes (Signed)
[place] PHYSICAL MEDICINE & REHABILITATION PROGRESS NOTE   Subjective/Complaints: Patient seen sitting up in his chair this AM.  No reported issues overnight.  He appears unaware of discharge today.   ROS: Denies CP, SOB, N/V/D  Objective:   No results found. Recent Labs    06/19/19 1153  WBC 5.3  HGB 12.1*  HCT 37.3*  PLT 295   No results for input(s): NA, K, CL, CO2, GLUCOSE, BUN, CREATININE, CALCIUM in the last 72 hours. No intake or output data in the 24 hours ending 06/20/19 0933   Physical Exam: Vital Signs Blood pressure 140/89, pulse 74, temperature 98.4 F (36.9 C), resp. rate 18, weight 79.1 kg, SpO2 100 %. Constitutional: No distress . Vital signs reviewed. HENT: Normocephalic.  Atraumatic. Eyes: EOMI. No discharge. Cardiovascular: No JVD. Respiratory: Normal effort.  No stridor. GI: Non-distended. Skin: Warm and dry.  Intact. Psych: Unable to assess due to cognition Musc: No edema in extremities.  No tenderness in extremities. Neurologic: Alert Able to follow 1 step commands, difficulty with 2 step commands Motor: Limited due to ability to follow commands, but appears to be: LUE:  5/5 proximal to distal LLE: 4+/5 proximal to distal, unchanged RUE: 4-4+/5 proximal to distal RLE: 4/5 proximal to distal  Dysarthria  Assessment/Plan: 1. Functional deficits secondary to RIght PCA infarct with left hemiataxia which require 3+ hours per day of interdisciplinary therapy in a comprehensive inpatient rehab setting.  Physiatrist is providing close team supervision and 24 hour management of active medical problems listed below.  Physiatrist and rehab team continue to assess barriers to discharge/monitor patient progress toward functional and medical goals  Care Tool:  Bathing  Bathing activity did not occur: Refused Body parts bathed by patient: Right arm, Left arm, Chest, Abdomen, Front perineal area, Buttocks, Face, Right upper leg, Left upper leg, Right  lower leg, Left lower leg   Body parts bathed by helper: Right arm, Left arm, Buttocks, Right lower leg, Left lower leg, Left upper leg Body parts n/a: Right lower leg, Left lower leg   Bathing assist Assist Level: Contact Guard/Touching assist     Upper Body Dressing/Undressing Upper body dressing   What is the patient wearing?: Pull over shirt    Upper body assist Assist Level: Supervision/Verbal cueing    Lower Body Dressing/Undressing Lower body dressing      What is the patient wearing?: Underwear/pull up, Pants     Lower body assist Assist for lower body dressing: Minimal Assistance - Patient > 75%     Toileting Toileting    Toileting assist Assist for toileting: Minimal Assistance - Patient > 75%     Transfers Chair/bed transfer  Transfers assist     Chair/bed transfer assist level: Contact Guard/Touching assist     Locomotion Ambulation   Ambulation assist      Assist level: Minimal Assistance - Patient > 75% Assistive device: Rollator Max distance: 150'   Walk 10 feet activity   Assist     Assist level: Minimal Assistance - Patient > 75% Assistive device: Rollator   Walk 50 feet activity   Assist    Assist level: Minimal Assistance - Patient > 75% Assistive device: Rollator    Walk 150 feet activity   Assist Walk 150 feet activity did not occur: Safety/medical concerns  Assist level: Minimal Assistance - Patient > 75% Assistive device: Rollator    Walk 10 feet on uneven surface  activity   Assist Walk 10 feet on uneven surfaces  activity did not occur: Safety/medical concerns         Wheelchair     Assist Will patient use wheelchair at discharge?: Yes Type of Wheelchair: Manual    Wheelchair assist level: Moderate Assistance - Patient 50 - 74% Max wheelchair distance: 75    Wheelchair 50 feet with 2 turns activity    Assist        Assist Level: Moderate Assistance - Patient 50 - 74%   Wheelchair  150 feet activity     Assist  Wheelchair 150 feet activity did not occur: Safety/medical concerns       Blood pressure 140/89, pulse 74, temperature 98.4 F (36.9 C), resp. rate 18, weight 79.1 kg, SpO2 100 %.  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 Antithrombotics: -DVT/anticoagulation:Lovenox, Cr WNL on 10/13             -venous dopplers negative for DVT -antiplatelet therapy: Aspirin 81 mg daily, Brilinta 90 mg twice daily 3. Pain Management:Tylenol as needed 4. Mood:Aricept 10 mg nightly, -antipsychotic agents: seroquel 25mg  tid 5. Neuropsych: This patientis not capable of making decisions on hisown behalf due to post-stroke cognitive deficits 6. Skin/Wound Care:routine care, skin checks. Improve nutrition 7. Fluids/Electrolytes/Nutrition:Routine in and outs 8. Hypertension. Norvasc 5 mg daily. Monitor with increased mobility Vitals:   06/19/19 2011 06/20/19 0657  BP: 138/80 140/89  Pulse: 70 74  Resp: 18 18  Temp: 98.7 F (37.1 C) 98.4 F (36.9 C)  SpO2: 100% 100%   Controlled on 10/16 9. Diabetes mellitus with hyperglycemia. Hemoglobin A1c 9.8.  -continue glucophage 500 mg twice daily. -cbg's ac and hs -titrate regimen as needed CBG (last 3)  Recent Labs    06/19/19 1654 06/19/19 2133 06/20/19 0653  GLUCAP 106* 129* 102*   Controlled on 10/16 10. UTI/enterococcusfaecalis. Completed 5-day course of Rocephin 11. Hyperlipidemia. 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   D1 thins, will go home on this diet  Follow-up speech therapy and advance as tolerated 13. Left upper extremity  involuntary movement/hemichorea/restlessness: Continue to monitor  EEG negative 14. Nose bleeds/epistaxis  -Resolved.   15.  Acute blood loss anemia  Hemoglobin 12.1 on 10/15  LOS: 17 days A FACE TO FACE EVALUATION WAS PERFORMED  Charley Miske Lorie Phenix 06/20/2019, 9:33 AM

## 2019-06-20 NOTE — Progress Notes (Signed)
Social Work Discharge Note   The overall goal for the admission was met for:   Discharge location: Yes-HOME WITH WIFE WHO IS AWARE PT REQUIRES 24 HR CARE  Length of Stay: Yes-17 DAYS  Discharge activity level: Yes-SUPERVISION-CGA LEVEL  Home/community participation: Yes  Services provided included: MD, RD, PT, OT, SLP, RN, CM, TR, Pharmacy, Neuropsych and SW  Financial Services: Other: PENDING MEDICAID  Follow-up services arranged: Home Health: [facility]-PT,OT,SP,SW, DME: [facility], [name] and Patient/Family has no preference for HH/DME agencies  Comments (or additional information):WIFE CAME IN ON MULTIPLE DAYS TO [place]. SHE IS ALSO A CNA FOR [employer] AND FEELS COMFORTABLE WITH HIS CARE. PCP APPOINTMENT MADE WITH [facility]-11/13 @ 2:50 PM FOR FOLLOW AND MATCH GIVEN TO WIFE FOR ASSIST WITH PRESCRIPTIONS. SSD AND MEDICAID IS PENDING AND SHE IS WORKING WITH [name]-FINANCIAL COUNSELOR FOR THIS  Patient/Family verbalized understanding of follow-up arrangements: Yes  Individual responsible for coordination of the follow-up plan: [name]  Confirmed correct DME delivered: [name] 06/20/2019    [name]

## 2019-06-20 NOTE — Progress Notes (Signed)
Patient discharged to home. Joe Perry Joe Perry

## 2019-06-24 ENCOUNTER — Telehealth: Payer: Self-pay

## 2019-06-24 NOTE — Telephone Encounter (Signed)
Home # is the wrong # and work # is [employer]. Called spouse # and left a generic message to call back.

## 2019-07-03 ENCOUNTER — Other Ambulatory Visit: Payer: Self-pay

## 2019-07-03 ENCOUNTER — Encounter: Payer: Self-pay | Attending: Registered Nurse | Admitting: Registered Nurse

## 2019-07-03 VITALS — BP 129/77 | HR 68 | Temp 97.2°F

## 2019-07-03 DIAGNOSIS — I6329 Cerebral infarction due to unspecified occlusion or stenosis of other precerebral arteries: Secondary | ICD-10-CM | POA: Insufficient documentation

## 2019-07-03 DIAGNOSIS — E1165 Type 2 diabetes mellitus with hyperglycemia: Secondary | ICD-10-CM | POA: Insufficient documentation

## 2019-07-03 DIAGNOSIS — I10 Essential (primary) hypertension: Secondary | ICD-10-CM | POA: Insufficient documentation

## 2019-07-03 DIAGNOSIS — E7849 Other hyperlipidemia: Secondary | ICD-10-CM | POA: Insufficient documentation

## 2019-07-03 DIAGNOSIS — I69391 Dysphagia following cerebral infarction: Secondary | ICD-10-CM | POA: Insufficient documentation

## 2019-07-03 NOTE — Progress Notes (Signed)
Subjective:    Patient ID: Joe Perry, male    DOB: September 16, 1950, 68 y.o.   MRN: [id number]  HPI: Joe Perry is a 68 y.o. 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  He was brought to [facility] ED via EMS on 03/10/2019 with left sided weakness, altered mental status  And right side gaze deviation of left -sided neglect. Neurology was consulted.   CT Head WO Contrast:  IMPRESSION: 1. No acute finding by CT. Extensive chronic small-vessel ischemic changes throughout the brain as outlined above. Old right frontal Infarction. CT Angio: Head: W or WO Contrast IMPRESSION: 1. Fetal type right PCA with proximal occlusion leading to 19 cc of penumbra and no infarct by CT perfusion. There may be a right P1 segment that is also occluded. 2. 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 3. Subjectively advanced left cavernous ICA stenosis. 4. 30-40% atheromatous narrowing at the right ICA bulb.  He underwent Emergent Large Vessel Occlusion Thrombolysis on 03/10/2019, by Dr. Estanislado Pandy.   He was maintained on aspirin and Brillinta for CVA prophylaxis.   He was admitted to inpatient rehabilitation on 06/03/2019 and discharged home on 06/20/2019. He is receiving outpatient therapy with [facility]. He denies pain. He rated his pain on Health and History 23. Wife reports he has a good appetite.   Wife in room all questions answered.   Pain Inventory Average Pain 2 Pain Right Now 2 My pain is intermittent  In the last 24 hours, has pain interfered with the following? General activity 0 Relation with others 0 Enjoyment of life 0 What TIME of day is your pain at its worst? na Sleep (in general) Good  Pain is worse  with: na Pain improves with: na Relief from Meds: na  Mobility ability to climb steps?  no do you drive?  no use a wheelchair needs help with transfers  Function disabled: date disabled . I need assistance with the following:  feeding, dressing, bathing, toileting, meal prep, household duties and shopping  Neuro/Psych weakness trouble walking dizziness confusion depression  Prior Studies Any changes since last visit?  no  Physicians involved in your care Any changes since last visit?  no   Family History  Problem Relation Age of Onset  . Diabetes Other   . Hypertension Other    Social History   Socioeconomic History  . Marital status: Married    Spouse name: Not on file  . Number of children: Not on file  . Years of education: Not on file  . Highest education level: Not on file  Occupational History  . Not on file  Social Needs  . Financial resource strain: Not on file  . Food insecurity    Worry: Not on file    Inability: Not on file  . Transportation needs    Medical: Not on file    Non-medical: Not on file  Tobacco Use  . Smoking status: Never Smoker  . Smokeless tobacco: Never Used  Substance and Sexual Activity  . Alcohol use: No  . Drug use: No  . Sexual activity: Not Currently  Lifestyle  . Physical activity    Days per week: Not on file    Minutes per session: Not on file  . Stress: Not on  file  Relationships  . Social Herbalist on phone: Not on file    Gets together: Not on file    Attends religious service: Not on file    Active member of club or organization: Not on file    Attends meetings of clubs or organizations: Not on file    Relationship status: Not on file  Other Topics Concern  . Not on file  Social History Narrative  . Not on file   Past Surgical History:  Procedure Laterality Date  . IR ANGIO INTRA EXTRACRAN SEL COM CAROTID INNOMINATE UNI L MOD SED  03/10/2019  . IR ANGIO VERTEBRAL SEL SUBCLAVIAN INNOMINATE  BILAT MOD SED  03/10/2019  . IR CT HEAD LTD  03/10/2019  . IR PERCUTANEOUS ART THROMBECTOMY/INFUSION INTRACRANIAL INC DIAG ANGIO  03/10/2019  . RADIOLOGY WITH ANESTHESIA N/A 03/10/2019   Procedure: IR WITH ANESTHESIA/CODE STROKE;  Surgeon: Radiologist, Medication, MD;  Location: [facility];  Service: Radiology;  Laterality: N/A;   Past Medical History:  Diagnosis Date  . Diabetes mellitus without complication ([place])   . Hypertension   . Stroke ([place])    BP 129/77   Pulse 68   Temp (!) 97.2 F (36.2 C)   SpO2 98%   Opioid Risk Score:   Fall Risk Score:  `1  Depression screen PHQ 2/9  Depression screen PHQ 2/9 09/08/2013  Decreased Interest 0  Down, Depressed, Hopeless 0  PHQ - 2 Score 0   Review of Systems  Constitutional: Positive for unexpected weight change.  Musculoskeletal: Positive for gait problem.  Neurological: Positive for dizziness and weakness.  Psychiatric/Behavioral: Positive for confusion and dysphoric mood.  All other systems reviewed and are negative.      Objective:   Physical Exam Constitutional:      Appearance: Normal appearance.  Neck:     Musculoskeletal: Normal range of motion and neck supple.  Cardiovascular:     Rate and Rhythm: Normal rate and regular rhythm.     Pulses: Normal pulses.     Heart sounds: Normal heart sounds.  Pulmonary:     Effort: Pulmonary effort is normal.     Breath sounds: Normal breath sounds.  Musculoskeletal:     Comments: Normal Muscle Bulk and Muscle Testing Reveals:  Upper Extremities: Full ROM and Muscle Strength 4/5 Lower Extremities: Full ROM and Muscle Strength 5/5 Arrived in wheelchair   Skin:    General: Skin is warm and dry.  Neurological:     Mental Status: He is alert and oriented to person, place, and time.           Assessment & Plan:  1. 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  2. Type 2 DM: Continue  current medication regimen. PCP Following. Has a scheduled appointment with PCP.  3. Hypertension: Continue current medication regimen. PCP Following.   4.  hyperlipidemia. Continue current medication regimen. PCP following.   20 minutes of face to face patient care time was spent during this visit. All questions were encouraged and answered.  F/U in 4-6 weeks with Dr Posey Pronto

## 2019-07-04 DIAGNOSIS — I10 Essential (primary) hypertension: Secondary | ICD-10-CM

## 2019-07-04 DIAGNOSIS — E119 Type 2 diabetes mellitus without complications: Secondary | ICD-10-CM

## 2019-07-04 DIAGNOSIS — Z7902 Long term (current) use of antithrombotics/antiplatelets: Secondary | ICD-10-CM

## 2019-07-04 DIAGNOSIS — I69391 Dysphagia following cerebral infarction: Secondary | ICD-10-CM

## 2019-07-04 DIAGNOSIS — I69318 Other symptoms and signs involving cognitive functions following cerebral infarction: Secondary | ICD-10-CM

## 2019-07-04 DIAGNOSIS — D62 Acute posthemorrhagic anemia: Secondary | ICD-10-CM

## 2019-07-04 DIAGNOSIS — Z9181 History of falling: Secondary | ICD-10-CM

## 2019-07-04 DIAGNOSIS — I69354 Hemiplegia and hemiparesis following cerebral infarction affecting left non-dominant side: Secondary | ICD-10-CM

## 2019-07-04 DIAGNOSIS — D333 Benign neoplasm of cranial nerves: Secondary | ICD-10-CM

## 2019-07-04 DIAGNOSIS — I700 Atherosclerosis of aorta: Secondary | ICD-10-CM

## 2019-07-04 DIAGNOSIS — F419 Anxiety disorder, unspecified: Secondary | ICD-10-CM

## 2019-07-04 DIAGNOSIS — Z7982 Long term (current) use of aspirin: Secondary | ICD-10-CM

## 2019-07-04 DIAGNOSIS — D630 Anemia in neoplastic disease: Secondary | ICD-10-CM

## 2019-07-04 DIAGNOSIS — E785 Hyperlipidemia, unspecified: Secondary | ICD-10-CM

## 2019-07-04 DIAGNOSIS — F329 Major depressive disorder, single episode, unspecified: Secondary | ICD-10-CM

## 2019-07-04 DIAGNOSIS — Z87440 Personal history of urinary (tract) infections: Secondary | ICD-10-CM

## 2019-07-04 DIAGNOSIS — R1310 Dysphagia, unspecified: Secondary | ICD-10-CM

## 2019-07-04 DIAGNOSIS — Z7984 Long term (current) use of oral hypoglycemic drugs: Secondary | ICD-10-CM

## 2019-07-07 ENCOUNTER — Encounter: Payer: Self-pay | Admitting: Registered Nurse

## 2019-07-18 ENCOUNTER — Telehealth: Payer: Self-pay | Admitting: Family Medicine

## 2019-07-18 ENCOUNTER — Encounter (HOSPITAL_COMMUNITY): Payer: Self-pay

## 2019-07-18 ENCOUNTER — Inpatient Hospital Stay: Payer: Self-pay | Admitting: Family Medicine

## 2019-07-18 ENCOUNTER — Ambulatory Visit (HOSPITAL_COMMUNITY)
Admission: EM | Admit: 2019-07-18 | Discharge: 2019-07-18 | Disposition: A | Discharge status: Home / Self Care | Payer: Self-pay | Attending: Urgent Care | Admitting: Urgent Care

## 2019-07-18 ENCOUNTER — Other Ambulatory Visit: Payer: Self-pay

## 2019-07-18 DIAGNOSIS — Z9119 Patient's noncompliance with other medical treatment and regimen: Secondary | ICD-10-CM

## 2019-07-18 DIAGNOSIS — E785 Hyperlipidemia, unspecified: Secondary | ICD-10-CM

## 2019-07-18 DIAGNOSIS — I10 Essential (primary) hypertension: Secondary | ICD-10-CM

## 2019-07-18 DIAGNOSIS — Z760 Encounter for issue of repeat prescription: Secondary | ICD-10-CM

## 2019-07-18 DIAGNOSIS — Z8673 Personal history of transient ischemic attack (TIA), and cerebral infarction without residual deficits: Secondary | ICD-10-CM

## 2019-07-18 MED ORDER — QUETIAPINE FUMARATE 25 MG PO TABS: 25.0000 mg | ORAL_TABLET | Freq: Three times a day (TID) | ORAL | 0 refills | Status: DC

## 2019-07-18 MED ORDER — DONEPEZIL HCL 10 MG PO TABS: 10.0000 mg | ORAL_TABLET | Freq: Every day | ORAL | 0 refills | Status: DC

## 2019-07-18 MED ORDER — AMLODIPINE BESYLATE 5 MG PO TABS: 5.0000 mg | ORAL_TABLET | Freq: Every day | ORAL | 0 refills | Status: DC

## 2019-07-18 MED ORDER — ATORVASTATIN CALCIUM 40 MG PO TABS: 40.0000 mg | ORAL_TABLET | Freq: Every day | ORAL | 0 refills | Status: DC

## 2019-07-18 MED ORDER — TICAGRELOR 90 MG PO TABS: 90.0000 mg | ORAL_TABLET | Freq: Two times a day (BID) | ORAL | 0 refills | Status: DC

## 2019-07-18 MED ORDER — METFORMIN HCL 500 MG PO TABS: 500.0000 mg | ORAL_TABLET | Freq: Two times a day (BID) | ORAL | 0 refills | Status: DC

## 2019-07-18 NOTE — ED Provider Notes (Signed)
[place]   MRN: [id number] DOB: [birth year]  Subjective:   Joe Perry is a 68 y.o. male presenting for medication refills.  Patient had an appointment today with his PCP but unfortunately this was canceled.  They attempted to get temporary refills but was not done given that they had not been seen at their clinic since 2015.  Patient is requesting medication refills for all of his medicines.  He has done a lot better since being discharged from the hospital.  No complaints at this time.  No current facility-administered medications for this encounter.   Current Outpatient Medications:  .  acetaminophen (TYLENOL) 325 MG tablet, Take 2 tablets (650 mg total) by mouth every 4 (four) hours as needed for mild pain (or temp > 37.5 C (99.5 F))., Disp:  , Rfl:  .  amLODipine (NORVASC) 5 MG tablet, Take 1 tablet (5 mg total) by mouth daily., Disp: 30 tablet, Rfl: 0 .  aspirin EC 81 MG tablet, Take 1 tablet (81 mg total) by mouth daily., Disp: 30 tablet, Rfl: 3 .  atorvastatin (LIPITOR) 40 MG tablet, Take 1 tablet (40 mg total) by mouth daily at 6 PM., Disp: 30 tablet, Rfl: 0 .  donepezil (ARICEPT) 10 MG tablet, Take 1 tablet (10 mg total) by mouth at bedtime., Disp: 30 tablet, Rfl: 0 .  metFORMIN (GLUCOPHAGE) 500 MG tablet, Take 1 tablet (500 mg total) by mouth 2 (two) times daily with a meal., Disp: 60 tablet, Rfl: 0 .  QUEtiapine (SEROQUEL) 25 MG tablet, Take 1 tablet (25 mg total) by mouth 3 (three) times daily., Disp: 90 tablet, Rfl: 0 .  ticagrelor (BRILINTA) 90 MG TABS tablet, Take 1 tablet (90 mg total) by mouth 2 (two) times daily., Disp: 60 tablet, Rfl: 0   No Known Allergies  Past Medical History:  Diagnosis Date  . Diabetes mellitus without complication ([place])   . Hypertension   . Stroke [facility])      Past Surgical History:  Procedure Laterality Date  . IR ANGIO INTRA EXTRACRAN SEL COM CAROTID INNOMINATE UNI L MOD SED  03/10/2019  . IR ANGIO VERTEBRAL SEL SUBCLAVIAN  INNOMINATE BILAT MOD SED  03/10/2019  . IR CT HEAD LTD  03/10/2019  . IR PERCUTANEOUS ART THROMBECTOMY/INFUSION INTRACRANIAL INC DIAG ANGIO  03/10/2019  . RADIOLOGY WITH ANESTHESIA N/A 03/10/2019   Procedure: IR WITH ANESTHESIA/CODE STROKE;  Surgeon: Radiologist, Medication, MD;  Location: [facility];  Service: Radiology;  Laterality: N/A;    Family History  Problem Relation Age of Onset  . Diabetes Other   . Hypertension Other   . Healthy Mother   . Healthy Father     Social History   Tobacco Use  . Smoking status: Never Smoker  . Smokeless tobacco: Never Used  Substance Use Topics  . Alcohol use: No  . Drug use: No    ROS   Objective:   Vitals: BP 130/78 (BP Location: Right Arm)   Pulse 84   Temp 98.6 F (37 C) (Oral)   Resp 16   SpO2 99%   Physical Exam Constitutional:      General: He is not in acute distress.    Appearance: Normal appearance. He is well-developed. He is not ill-appearing, toxic-appearing or diaphoretic.  HENT:     Head: Normocephalic and atraumatic.     Right Ear: External ear normal.     Left Ear: External ear normal.     Nose: Nose normal.     Mouth/Throat:  Mouth: Mucous membranes are moist.     Pharynx: Oropharynx is clear.  Eyes:     General: No scleral icterus.    Extraocular Movements: Extraocular movements intact.     Pupils: Pupils are equal, round, and reactive to light.  Cardiovascular:     Rate and Rhythm: Normal rate and regular rhythm.     Heart sounds: Normal heart sounds. No murmur. No friction rub. No gallop.   Pulmonary:     Effort: Pulmonary effort is normal. No respiratory distress.     Breath sounds: Normal breath sounds. No stridor. No wheezing, rhonchi or rales.  Neurological:     Mental Status: He is alert and oriented to person, place, and time.  Psychiatric:        Mood and Affect: Mood normal.        Behavior: Behavior normal.        Thought Content: Thought content normal.      Assessment and Plan :   1.  Medication refill   2. Essential hypertension   3. Dyslipidemia, goal LDL below 70   4. History of stroke   5. Dyslipidemia     Medication refills provided.  Patient physical exam vital signs stable for discharge.  He is to have close follow-up with his PCP on 07/30/2019. Counseled patient on potential for adverse effects with medications prescribed/recommended today, ER and return-to-clinic precautions discussed, patient verbalized understanding.    Jaynee Eagles, PA-C 07/18/19 1745

## 2019-07-18 NOTE — Telephone Encounter (Signed)
Patient wife came to the facility upset because patient appointment was canceled for 07/18/19 due to the provider being out of the office.  Patient was in the hospital and is out of medication. Patient cannot have his medication refilled because he has not been seen since 2015. An appointment has been rescheduled for 07/30/2019. Patient wife left upset.

## 2019-07-18 NOTE — ED Triage Notes (Signed)
Patient presents to Urgent Care with complaints of needing medication refills on his Brilinta, Metformin, donepezil, amlodipine, quetiapine fumarate, and atrovastatin since running out of some and low on others. Patient reports he has not been able to get in with his PCP since being discharged from the hospital for a stroke last month.

## 2019-07-21 NOTE — Telephone Encounter (Signed)
Refills were already sent by another provider.

## 2019-07-22 DIAGNOSIS — R739 Hyperglycemia, unspecified: Secondary | ICD-10-CM

## 2019-07-22 LAB — GLUCOSE, POCT (MANUAL RESULT ENTRY): POC Glucose: 165 mg/dl — AB (ref 70–99)

## 2019-07-22 NOTE — Congregational Nurse Program (Signed)
  Dept: [facility] Nurse Program Note  Date of Encounter: 07/22/2019  Past Medical History: Past Medical History:  Diagnosis Date  . Diabetes mellitus without complication ([place])   . Hypertension   . Stroke [facility])     Encounter Details:  Client came in [place] for immigration assistance and was found shaky and confused. He was oriented to self and disoriented to place and time and situation. Spouse stated this is normal following stroke.  Blood pressure and blood sugar done. Blood pressure on the high side and he is on hypertension medication. Blood sugar was 165 and taking Metformin. Spouse educated on nutrition diet recommendations for diabetes. Frequent meals and low carbohydrate choices. Spouse motivated to make changes. Honor Loh, RN, BSN, [place], [place]

## 2019-07-24 ENCOUNTER — Other Ambulatory Visit: Payer: Self-pay

## 2019-07-24 ENCOUNTER — Ambulatory Visit (INDEPENDENT_AMBULATORY_CARE_PROVIDER_SITE_OTHER): Payer: Self-pay | Admitting: Adult Health

## 2019-07-24 ENCOUNTER — Encounter: Payer: Self-pay | Admitting: Adult Health

## 2019-07-24 VITALS — BP 136/88 | HR 85 | Temp 97.2°F

## 2019-07-24 DIAGNOSIS — I69398 Other sequelae of cerebral infarction: Secondary | ICD-10-CM

## 2019-07-24 DIAGNOSIS — I6329 Cerebral infarction due to unspecified occlusion or stenosis of other precerebral arteries: Secondary | ICD-10-CM

## 2019-07-24 DIAGNOSIS — E119 Type 2 diabetes mellitus without complications: Secondary | ICD-10-CM

## 2019-07-24 DIAGNOSIS — D333 Benign neoplasm of cranial nerves: Secondary | ICD-10-CM

## 2019-07-24 DIAGNOSIS — E785 Hyperlipidemia, unspecified: Secondary | ICD-10-CM

## 2019-07-24 DIAGNOSIS — G8194 Hemiplegia, unspecified affecting left nondominant side: Secondary | ICD-10-CM

## 2019-07-24 DIAGNOSIS — H53462 Homonymous bilateral field defects, left side: Secondary | ICD-10-CM

## 2019-07-24 DIAGNOSIS — R498 Other voice and resonance disorders: Secondary | ICD-10-CM

## 2019-07-24 DIAGNOSIS — I10 Essential (primary) hypertension: Secondary | ICD-10-CM

## 2019-07-24 MED ORDER — QUETIAPINE FUMARATE 25 MG PO TABS: 25.0000 mg | ORAL_TABLET | Freq: Two times a day (BID) | ORAL | 3 refills | Status: DC

## 2019-07-24 MED ORDER — DONEPEZIL HCL 10 MG PO TABS: 10.0000 mg | ORAL_TABLET | Freq: Every day | ORAL | 3 refills | Status: DC

## 2019-07-24 NOTE — Progress Notes (Signed)
[facility] [address] [place]. [place] [address] [phone]       HOSPITAL FOLLOW UP NOTE  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  History obtained from patient, wife and chart review. Reviewed all radiology images and labs personally.  Joe Perry a 68 y.o.malewith history of CVA, HTN, and DB7/02/2019 with L sided weakness and speech difficulty.  Stroke work-up revealed right large PCA infarct as evidenced on MRI status post IR with TICI 2B revascularization secondary to large vessel disease.  He did not receive IV TPA due to being outside window.  CTA head/neck showed right P1 occlusion, small BA with high-grade atheromatous narrowing and left ICA siphon stenosis.  CT perfusion showed 19 cc penumbra.  Due to occlusion, he was transferred to IR with cerebral angio achieving TICI 2B revascularization of occluded R PCOM and P PCA.  Repeat MRI w/wo contrast showed no acute/subacute infarcts left BG and right cerebellar peduncle on 05/02/2019.  2D echo normal EF.  Telemetry monitoring negative for atrial fibrillation during hospitalization.  HIV negative.  LDL 101.  A1c 9.8.  Initially recommended DAPT with aspirin and Plavix for possible allergic reaction to Plavix therefore discharged on aspirin 81 mg and Brilinta 90 mg twice daily.  Prior history of stroke with most recent 11/2018 right frontal and parietal white matter infarcts and signed out AMA.  HTN stable on amlodipine 10 mg daily.  Initiated atorvastatin 40 mg daily.  Uncontrolled DM and discharged on Metformin 5 mg twice daily and recommend close PCP  follow-up.  Other stroke risk factors include advanced age and overweight.  Involuntary movement during admission likely due to right thalamic involvement with trial of Abilify when able to tolerate and chorea-like movements slowly improving.  Cognitive decline during admission with agitation and restlessness but EEG unremarkable and initiated Seroquel 25 mg 3 times daily and Aricept 10 mg daily.  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  Residual deficits of nonfluent speech,, dysphagia, left homonymous hemianopia and bilateral L>R chorea-like movements.  Patient was discharged to CIR on 06/03/2019 for ongoing therapy for residual deficits and discharged home on 06/20/2019.  [name] is a 68 year old male who is being seen today for hospital follow-up accompanied by his wife.  Wife provides majority of the history due to language barrier patient.  Residual deficits of left homonymous hemianopia, cognitive impairment and left hemiparesis.  He continues to work with home health PT/OT/ST but per wife, he only has 3 additional sessions and then will be completed.  He is ambulating with a rolling walker but currently in wheelchair due to long distance.  She endorses cognition improving with symptoms waxing/waning.  He does not have any residual swallowing issues and currently on a regular diet.  Mood has been stable with ongoing use of Seroquel 25 mg 3 times daily and Aricept 10 mg nightly.  He has not had any additional chorea-like movements.  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  Unfortunately, he has no insurance coverage at this  time.  Wife is unable to work due to needing to care for patient.  He has continued on aspirin and Brilinta without bleeding or bruising.  Continues on atorvastatin without myalgias.  Blood pressures today 136/88.  He has not had  follow-up with vascular surgery as recommended.  Evidence of vestibular schwannoma on imaging during admission but appears to be asymptomatic.  No further concerns at this time.    ROS:   14 system review of systems performed and negative with exception of memory loss, confusion, weakness, fatigue  PMH:  Past Medical History:  Diagnosis Date   Diabetes mellitus without complication ([place])    Hypertension    Stroke ([place])     PSH:  Past Surgical History:  Procedure Laterality Date   IR ANGIO INTRA EXTRACRAN SEL COM CAROTID INNOMINATE UNI L MOD SED  03/10/2019   IR ANGIO VERTEBRAL SEL SUBCLAVIAN INNOMINATE BILAT MOD SED  03/10/2019   IR CT HEAD LTD  03/10/2019   IR PERCUTANEOUS ART THROMBECTOMY/INFUSION INTRACRANIAL INC DIAG ANGIO  03/10/2019   RADIOLOGY WITH ANESTHESIA N/A 03/10/2019   Procedure: IR WITH ANESTHESIA/CODE STROKE;  Surgeon: Radiologist, Medication, MD;  Location: [facility];  Service: Radiology;  Laterality: N/A;    Social History:  Social History   Socioeconomic History   Marital status: Married    Spouse name: [name]   Number of children: Not on file   Years of education: Not on file   Highest education level: Not on file  Occupational History   Not on file  Social Needs   Financial resource strain: Not on file   Food insecurity    Worry: Not on file    Inability: Not on file   Transportation needs    Medical: Not on file    Non-medical: Not on file  Tobacco Use   Smoking status: Never Smoker   Smokeless tobacco: Never Used  Substance and Sexual Activity   Alcohol use: No   Drug use: No   Sexual activity: Not Currently  Lifestyle   Physical activity    Days per week: Not on file    Minutes per session: Not on file   Stress: Not on file  Relationships   Social connections    Talks on phone: Not on file    Gets together: Not on file    Attends religious service: Not on file    Active member of club or organization: Not on file     Attends meetings of clubs or organizations: Not on file    Relationship status: Not on file   Intimate partner violence    Fear of current or ex partner: Not on file    Emotionally abused: Not on file    Physically abused: Not on file    Forced sexual activity: Not on file  Other Topics Concern   Not on file  Social History Narrative   Lives with wife    Family History:  Family History  Problem Relation Age of Onset   Diabetes Other    Hypertension Other    Healthy Mother    Healthy Father     Medications:   Current Outpatient Medications on File Prior to Visit  Medication Sig Dispense Refill   acetaminophen (TYLENOL) 325 MG tablet Take 2 tablets (650 mg total) by mouth every 4 (four) hours as needed for mild pain (or temp > 37.5 C (99.5 F)).     amLODipine (NORVASC) 5 MG tablet Take 1 tablet (5 mg total)  by mouth daily. 30 tablet 0   aspirin EC 81 MG tablet Take 1 tablet (81 mg total) by mouth daily. 30 tablet 3   atorvastatin (LIPITOR) 40 MG tablet Take 1 tablet (40 mg total) by mouth daily at 6 PM. 30 tablet 0   metFORMIN (GLUCOPHAGE) 500 MG tablet Take 1 tablet (500 mg total) by mouth 2 (two) times daily with a meal. 60 tablet 0   No current facility-administered medications on file prior to visit.     Allergies:  No Known Allergies   Physical Exam  Vitals:   07/24/19 1357  BP: 136/88  Pulse: 85  Temp: (!) 97.2 F (36.2 C)   There is no height or weight on file to calculate BMI. No exam data present   General: well developed, well nourished,  [name], seated, in no evident distress Head: head normocephalic and atraumatic.   Neck: supple with no carotid or supraclavicular bruits Cardiovascular: regular rate and rhythm, no murmurs Musculoskeletal: no deformity Skin:  no rash/petichiae Vascular:  Normal pulses all extremities   Neurologic Exam Mental Status: Appears fatigued during visit drifting off easily but would be  easily awakened.   Language barrier but per wife, no residual speech deficits such as word finding difficulty.  Hypophonia present.  Oriented to place and time. Recent and remote memory diminished. Attention span, concentration and fund of knowledge diminished.  Difficulty assessing further cognition due to increased fatigue.  Mood and affect appropriate.  Cranial Nerves: Fundoscopic exam reveals sharp disc margins. Pupils equal, briskly reactive to light. Extraocular movements intact with limitation of left eye left lateral gaze. Visual fields left dense homonymous hemianopia.  Hearing intact. Facial sensation intact. Face, tongue, palate moves normally and symmetrically.  Motor: Normal bulk and tone.  LUE: 4+/5 with weak grip strength LLE: 4+/5 hip flexor and ankle dorsiflexion Sensory.: intact to touch , pinprick , position and vibratory sensation.  Coordination: Rapid alternating movements normal in all extremities except decreased left hand finger tapping. Finger-to-nose and heel-to-shin performed accurately bilaterally. Gait and Station: Deferred currently in wheelchair Reflexes: 1+ and symmetric. Toes downgoing.     NIHSS  1 Modified Rankin  3-4   Diagnostic Data (Labs, Imaging, Testing)  Ct Head Code Stroke Wo Contrast 03/10/2019 1454 1. No acute finding by CT. Extensive chronic small-vessel ischemic changes throughout the brain as outlined above. Old right frontal infarction. 2. ASPECTS is 10.   Ct Angio Head W Or Wo Contrast Ct Angio Neck W Or Wo Contrast Ct Cerebral Perfusion W Contrast 03/10/2019 1517 1. Fetal type right PCA with proximal occlusion leading to 19 cc of penumbra and no infarct by CT perfusion. There may be a right P1 segment that is also occluded. 2. 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 3. Subjectively advanced left cavernous ICA stenosis. 4. 30-40%  atheromatous narrowing at the right ICA bulb.   CT angio / IR Percutaneous Art Thrombectomy 03/10/2019 S/P 4 vessel cerebral arteriogram followed by completev Revascularization Of RT PCOM and RT PCA with x 1 pass with 20mm x 17mm embotrap retriver device achieving a TICI 2b revascularization. 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 Motion degraded study. 2. Ischemic infarct of the right posterior cerebral artery territory, roughly corresponding to the ischemic volume identified on the earlier CT perfusion scan. 3.  No hemorrhage or mass effect. 4. Chronic ischemic microangiopathy and age advanced atrophy.  CT Head WO Contrast 8/72020 Evolving infarct in the right PCA territory. No progression since prior studies. No acute hemorrhage. Atrophy and extensive chronic vessel ischemia.  MRI  05/02/2019 1. Resolving diffusion restriction in the right PCA territory, with patchy residual in the right splenium. Petechial hemorrhage and post ischemic enhancement in the right thalamus and right occipital lobe. No mass effect.  2. Underlying severe chronic ischemic disease with superimposed new small acute to subacute infarcts in the:  - left basal ganglia. - right cerebellar peduncle. 3. 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  2D Echocardiogram  1. The left ventricle has normal systolic function with an ejection fraction of 60-65%. The cavity size was normal. Left ventricular diastolic Doppler parameters are consistent with impaired relaxation. 2. The right ventricle has normal systolic function. The cavity was normal. There is no increase in right ventricular wall thickness. 3. Left atrial size was mildly dilated. 4. Right atrial size was mildly dilated. 5. The aortic root and ascending aorta are normal in size and structure. 6. The interatrial septum was not assessed.  CXR    05/28/2019 1. No radiographic evidence of acute cardiopulmonary disease. 2. Aortic atherosclerosis.    ASSESSMENT: Joe Perry is a 68 y.o. year old male presented with left-sided weakness and speech difficulty on 03/10/2019 with stroke work-up revealing right large PCA infarct status post IR with TICI 2B revascularization secondary to large vessel disease. Vascular risk factors include prior stroke, HTN, HLD, DM, advanced age and overweight.  Residual deficits of left hemiparesis, cognitive impairment, speech impairment with hypophonia and left homonymous hemianopia    PLAN:  1. Right PCA stroke: Continue aspirin 81 mg daily  and atorvastatin for secondary stroke prevention.  Brilinta initiated due to potential allergy of Plavix as DAPT for 3 months recommended.  No stent placement during IR procedure.  At this time, recommend discontinuing Brilinta and continue aspirin alone.  Maintain strict control of hypertension with blood pressure goal below 130/90, diabetes with hemoglobin A1c goal below 6.5% and cholesterol with LDL cholesterol (bad cholesterol) goal below 70 mg/dL.  I also advised the patient to eat a healthy diet with plenty of whole grains, cereals, fruits and vegetables, exercise regularly with at least 30 minutes of continuous activity daily and maintain ideal body weight. 2. HTN: Advised to continue current treatment regimen.  Today's BP 136/88.  Advised to continue to monitor at home along with continued follow-up with PCP for management 3. HLD: Advised to continue current treatment regimen along with continued follow-up with PCP for future prescribing and monitoring of lipid panel 4. DMII: Advised to continue to monitor glucose levels at home along with continued follow-up with PCP for management and monitoring 5. S/p thrombectomy: Follow-up needed with Dr. Estanislado Pandy as recommended 4-week follow-up post procedure.  Provided wife with office phone number to schedule follow-up  visit 6. Residual deficits as above: Continuation of home health therapies and recommend transitioning to outpatient therapies once completed.  Patient is currently uninsured and provided wife with [place] financial assistance program paperwork.  Discussion also regarding applying for Medicaid and/or Medicare.  May need to hold off on additional therapies until financial assistance or insurance approval 7. Vestibular schwannoma: Currently asymptomatic.  Will discuss surveillance monitoring imaging at follow-up visit.  If patient becomes symptomatic, referral will be placed to neurosurgery for further evaluation.  Wife requested to hold  off on additional imaging at this time due to lack of insurance    Follow up in 3 months or call earlier if needed   Greater than 50% of time during this 45 minute visit was spent on counseling, explanation of diagnosis of right PCA stroke, reviewing risk factor management of HTN, HLD, DM and prior strokes, discussion regarding residual deficits and ongoing therapy, discussion regarding indication to follow-up with vascular surgery, discussion regarding further monitoring of vestibular schwannoma, planning of further management along with potential future management, and discussion with patient and family answering all questions.    Frann Rider, AGNP-BC  [facility] [address] [place], [place] [address]  Phone [phone] Fax [phone] Note: This document was prepared with digital dictation and possible smart phrase technology. Any transcriptional errors that result from this process are unintentional.

## 2019-07-24 NOTE — Patient Instructions (Addendum)
Orders will be placed to continue therapies outpatient  Continue aspirin 81 mg daily  and Lipitor for secondary stroke prevention  Decrease Seroquel dosage from 25 mg 3 times daily to 25 mg twice daily  Continuation of Aricept 10 mg nightly  Discontinue Brilinta at this time as it is no longer needed  Please schedule follow-up with vascular surgery Dr. Estanislado Pandy Office number [phone]  Continue to follow up with PCP regarding cholesterol and blood pressure management   Continue to monitor blood pressure at home  Maintain strict control of hypertension with blood pressure goal below 130/90, diabetes with hemoglobin A1c goal below 6.5% and cholesterol with LDL cholesterol (bad cholesterol) goal below 70 mg/dL. I also advised the patient to eat a healthy diet with plenty of whole grains, cereals, fruits and vegetables, exercise regularly and maintain ideal body weight.  Followup in the future with me in 3 months or call earlier if needed       Thank you for coming to see [place] at [facility]. I hope we have been able to provide you high quality care today.  You may receive a patient satisfaction survey over the next few weeks. We would appreciate your feedback and comments so that we may continue to improve ourselves and the health of our patients.

## 2019-07-25 NOTE — Progress Notes (Signed)
I agree with the above plan 

## 2019-07-30 ENCOUNTER — Other Ambulatory Visit: Payer: Self-pay

## 2019-07-30 ENCOUNTER — Ambulatory Visit: Payer: Self-pay | Attending: Family Medicine | Admitting: Physician Assistant

## 2019-07-30 DIAGNOSIS — E785 Hyperlipidemia, unspecified: Secondary | ICD-10-CM

## 2019-07-30 DIAGNOSIS — I10 Essential (primary) hypertension: Secondary | ICD-10-CM

## 2019-07-30 DIAGNOSIS — E119 Type 2 diabetes mellitus without complications: Secondary | ICD-10-CM

## 2019-07-30 DIAGNOSIS — I69319 Unspecified symptoms and signs involving cognitive functions following cerebral infarction: Secondary | ICD-10-CM

## 2019-07-30 DIAGNOSIS — N39498 Other specified urinary incontinence: Secondary | ICD-10-CM

## 2019-07-30 DIAGNOSIS — I69391 Dysphagia following cerebral infarction: Secondary | ICD-10-CM

## 2019-07-30 MED ORDER — AMLODIPINE BESYLATE 5 MG PO TABS: 5.0000 mg | ORAL_TABLET | Freq: Every day | ORAL | 2 refills | Status: DC

## 2019-07-30 MED ORDER — DONEPEZIL HCL 10 MG PO TABS: 10.0000 mg | ORAL_TABLET | Freq: Every day | ORAL | 3 refills | Status: DC

## 2019-07-30 MED ORDER — QUETIAPINE FUMARATE 25 MG PO TABS: 25.0000 mg | ORAL_TABLET | Freq: Two times a day (BID) | ORAL | 3 refills | Status: DC

## 2019-07-30 MED ORDER — METFORMIN HCL 500 MG PO TABS: 500.0000 mg | ORAL_TABLET | Freq: Two times a day (BID) | ORAL | 2 refills | Status: DC

## 2019-07-30 MED ORDER — ATORVASTATIN CALCIUM 40 MG PO TABS: 40.0000 mg | ORAL_TABLET | Freq: Every day | ORAL | 2 refills | Status: DC

## 2019-07-30 NOTE — Progress Notes (Signed)
Patient verified DOB Patient has eaten today. Patient has taken medication today. Patient denies pain at this time. CBG today was 117 before eating

## 2019-07-30 NOTE — Progress Notes (Signed)
Patient ID: Joe Perry, male   DOB: February 11, 1951, 68 y.o.   MRN: [id number]  Virtual Visit via Telephone Note  I connected with Joe Perry on 07/30/19 at 10:30 AM EST by telephone and verified that I am speaking with the correct person using two identifiers.   I discussed the limitations, risks, security and privacy concerns of performing an evaluation and management service by telephone and the availability of in person appointments. I also discussed with the patient that there may be a patient responsible charge related to this service. The patient expressed understanding and agreed to proceed.  Patient location:  Home  My Location:  [place] office Persons on the call: me, the patient, and his wife    History of Present Illness: after being seen in the ED for medication RF on 07/18/2019.  I am speaking with the patient's wife.  She says home health was supposed to be set up.  She needs adult diaper resources.  She needs RF on his meds.  Blood sugars running from 97-137.    From ED A/P: 1. Medication refill   2. Essential hypertension   3. Dyslipidemia, goal LDL below 70   4. History of stroke   5. Dyslipidemia     Medication refills provided.  Patient physical exam vital signs stable for discharge.  He is to have close follow-up with his PCP on 07/30/2019.    Observations/Objective:  NAD.  A&Ox3   Assessment and Plan: 1. Cognitive deficit, post-stroke - QUEtiapine (SEROQUEL) 25 MG tablet; Take 1 tablet (25 mg total) by mouth 2 (two) times daily.  Dispense: 60 tablet; Refill: 3 - donepezil (ARICEPT) 10 MG tablet; Take 1 tablet (10 mg total) by mouth at bedtime.  Dispense: 30 tablet; Refill: 3 - Ambulatory referral to Social Work  2. Type 2 diabetes mellitus without complication, without long-term current use of insulin ([name]) Controlled-continue - metFORMIN (GLUCOPHAGE) 500 MG tablet; Take 1 tablet (500 mg total) by mouth 2 (two) times daily with a meal.  Dispense: 60  tablet; Refill: 2  3. Dysphagia, post-stroke - Ambulatory referral to Social Work  4. Dyslipidemia, goal LDL below 70 - atorvastatin (LIPITOR) 40 MG tablet; Take 1 tablet (40 mg total) by mouth daily at 6 PM.  Dispense: 30 tablet; Refill: 2  5. Essential hypertension - amLODipine (NORVASC) 5 MG tablet; Take 1 tablet (5 mg total) by mouth daily.  Dispense: 30 tablet; Refill: 2  6. Other urinary incontinence - Ambulatory referral to Social Work to see if we can help with diapering supplies.    7. Hospital follow-up-  Follow Up Instructions: See PCP in 1 month   I discussed the assessment and treatment plan with the patient. The patient was provided an opportunity to ask questions and all were answered. The patient agreed with the plan and demonstrated an understanding of the instructions.   The patient was advised to call back or seek an in-person evaluation if the symptoms worsen or if the condition fails to improve as anticipated.  I provided 14 minutes of non-face-to-face time during this encounter.   Freeman Caldron, PA-C

## 2019-08-05 ENCOUNTER — Encounter: Payer: Self-pay | Attending: Registered Nurse | Admitting: Physical Medicine & Rehabilitation

## 2019-08-05 ENCOUNTER — Other Ambulatory Visit: Payer: Self-pay

## 2019-08-05 ENCOUNTER — Encounter: Payer: Self-pay | Admitting: Physical Medicine & Rehabilitation

## 2019-08-05 ENCOUNTER — Ambulatory Visit: Payer: Self-pay | Admitting: Physical Medicine & Rehabilitation

## 2019-08-05 VITALS — BP 149/93 | HR 80 | Temp 97.3°F | Ht 69.0 in | Wt 175.0 lb

## 2019-08-05 DIAGNOSIS — R269 Unspecified abnormalities of gait and mobility: Secondary | ICD-10-CM

## 2019-08-05 DIAGNOSIS — I6329 Cerebral infarction due to unspecified occlusion or stenosis of other precerebral arteries: Secondary | ICD-10-CM | POA: Insufficient documentation

## 2019-08-05 DIAGNOSIS — E1165 Type 2 diabetes mellitus with hyperglycemia: Secondary | ICD-10-CM | POA: Insufficient documentation

## 2019-08-05 DIAGNOSIS — I10 Essential (primary) hypertension: Secondary | ICD-10-CM | POA: Insufficient documentation

## 2019-08-05 DIAGNOSIS — I69391 Dysphagia following cerebral infarction: Secondary | ICD-10-CM | POA: Insufficient documentation

## 2019-08-05 DIAGNOSIS — E7849 Other hyperlipidemia: Secondary | ICD-10-CM | POA: Insufficient documentation

## 2019-08-05 NOTE — Progress Notes (Signed)
Subjective:    Patient ID: Joe Perry, male    DOB: 1950/11/09, 68 y.o.   MRN: [id number]  HPI Right-handed male with history of CVA 2017 as well as March 2020 placed on dual antiplatelet therapy and patient signed out AMA, hypertension, diabetes mellitus presents for follow up after receiving CIR for large right PCA infarction status post IR revascularization occluded R PCOM and PCA.    Wife provided history.  Last clinic visit on 07/03/2019 by NP, notes reviewed.  Since that time, they followed up with VIR. He followed up with PCP. He completed therapies. CBGs have been relatively controlled. BP is slightly elevated, but normal at home. He is on regular thins without issues. He had 1 fall missing a step.  Using rolling walker at all times. Cognition is slowly improving.   Pain Inventory Average Pain 0 Pain Right Now 0 My pain is na  In the last 24 hours, has pain interfered with the following? General activity 0 Relation with others 0 Enjoyment of life 0 What TIME of day is your pain at its worst? na Sleep (in general) Good  Pain is worse with: na Pain improves with: na Relief from Meds: na  Mobility walk with assistance use a walker ability to climb steps?  yes do you drive?  no use a wheelchair  Function retired  Neuro/Psych bladder control problems confusion  Prior Studies Any changes since last visit?  no  Physicians involved in your care Any changes since last visit?  no   Family History  Problem Relation Age of Onset  . Diabetes Other   . Hypertension Other   . Healthy Mother   . Healthy Father    Social History   Socioeconomic History  . Marital status: Married    Spouse name: [name]  . Number of children: Not on file  . Years of education: Not on file  . Highest education level: Not on file  Occupational History  . Not on file  Social Needs  . Financial resource strain: Not on file  . Food insecurity    Worry: Not on file   Inability: Not on file  . Transportation needs    Medical: Not on file    Non-medical: Not on file  Tobacco Use  . Smoking status: Never Smoker  . Smokeless tobacco: Never Used  Substance and Sexual Activity  . Alcohol use: No  . Drug use: No  . Sexual activity: Not Currently  Lifestyle  . Physical activity    Days per week: Not on file    Minutes per session: Not on file  . Stress: Not on file  Relationships  . Social Herbalist on phone: Not on file    Gets together: Not on file    Attends religious service: Not on file    Active member of club or organization: Not on file    Attends meetings of clubs or organizations: Not on file    Relationship status: Not on file  Other Topics Concern  . Not on file  Social History Narrative   Lives with wife   Past Surgical History:  Procedure Laterality Date  . IR ANGIO INTRA EXTRACRAN SEL COM CAROTID INNOMINATE UNI L MOD SED  03/10/2019  . IR ANGIO VERTEBRAL SEL SUBCLAVIAN INNOMINATE BILAT MOD SED  03/10/2019  . IR CT HEAD LTD  03/10/2019  . IR PERCUTANEOUS ART THROMBECTOMY/INFUSION INTRACRANIAL INC DIAG ANGIO  03/10/2019  . RADIOLOGY WITH ANESTHESIA  N/A 03/10/2019   Procedure: IR WITH ANESTHESIA/CODE STROKE;  Surgeon: Radiologist, Medication, MD;  Location: [place];  Service: Radiology;  Laterality: N/A;   Past Medical History:  Diagnosis Date  . Diabetes mellitus without complication ([place])   . Hypertension   . Stroke ([place])    BP (!) 149/93   Pulse 80   Temp (!) 97.3 F (36.3 C)   Ht 5\' 9"  (1.753 m)   Wt 175 lb (79.4 kg)   SpO2 98%   BMI 25.84 kg/m   Opioid Risk Score:   Fall Risk Score:  `1  Depression screen PHQ 2/9  Depression screen [facility] 2/9 07/03/2019 09/08/2013  Decreased Interest 0 0  Down, Depressed, Hopeless 0 0  PHQ - 2 Score 0 0  Altered sleeping 2 -  Tired, decreased energy 1 -  Change in appetite 0 -  Feeling bad or failure about yourself  1 -  Trouble concentrating 1 -  Moving slowly or  fidgety/restless 1 -  Suicidal thoughts 0 -  PHQ-9 Score 6 -  Difficult doing work/chores Somewhat difficult -     Review of Systems  Constitutional: Negative.   HENT: Negative.   Eyes: Negative.   Respiratory: Positive for cough.   Cardiovascular: Negative.   Gastrointestinal: Negative.   Endocrine: Negative.   Genitourinary: Positive for difficulty urinating.  Musculoskeletal: Positive for gait problem.  Allergic/Immunologic: Negative.   Neurological: Positive for weakness. Negative for numbness.  Hematological: Negative.   Psychiatric/Behavioral: Positive for confusion.      Objective:   Physical Exam  Constitutional: No distress . Vital signs reviewed. HENT: Normocephalic.  Atraumatic. Eyes: EOMI. No discharge. Cardiovascular: No JVD. Respiratory: Normal effort.  No stridor. GI: Non-distended. Skin: Warm and dry.  Intact. Psych: Flat. Musc: No edema in extremities.  No tenderness in extremities. Neurologic: Alert Motor: Limited due to ability to follow commands, but appears to be: LUE:  5/5 proximal to distal LLE: 4+/5 proximal to distal, unchanged RUE: 4-4+/5 proximal to distal RLE: 4/5 proximal to distal  Dysarthria    Assessment & Plan:  Right-handed male with history of CVA 2017 as well as March 2020 placed on dual antiplatelet therapy and patient signed out AMA, hypertension, diabetes mellitus presents for follow up after receiving CIR for large right PCA infarction status post IR revascularization occluded R PCOM and PCA  1.  Left-sided weakness with right gaze preference and left neglect secondary to right large PCA infarction status post IR revascularization occluded R PCOM and PCA as well as history of CVA in May 2017 and March 2020             Cont HEP, completed [facility]  Cont follow up with Neurology  2.  Hypertension.    Cont meds  Elevated today, however WNL at home  Follow up PCP  3. Diabetes mellitus with hyperglycemia.    Cont meds  CBGs  relatively controlled  Follow up PCP  4.  Post stroke dysphagia.    Improving, discussed small bited/sips  5.  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 Gait abnormality  Cont walker for safety  Cont HEP

## 2019-08-19 ENCOUNTER — Telehealth: Payer: Self-pay

## 2019-08-19 ENCOUNTER — Telehealth: Payer: Self-pay | Admitting: Family Medicine

## 2019-08-19 NOTE — Telephone Encounter (Signed)
Wife of patient. Returned call and left message to call back.

## 2019-08-19 NOTE — Telephone Encounter (Signed)
Patient wife called asking for a call back husbands sugar is 160 without eating and he has had all his medication.

## 2019-08-20 NOTE — Telephone Encounter (Signed)
Spoke to patient wife, Mr. Flatley was asleep.  Informed that 160 was not too bad. Informed that 130 or lower was recommended by ADA. If blood sugars are >260, to drink lots of water, try to move around, monitor diet intake and call MD. Verbalized understanding.   Question about discontinuing Brilinta-  1. Informed Mrs. Ruvalcaba of this message per Frann Rider, NP-  Right PCA stroke: Continue aspirin 81 mg daily  and atorvastatin for secondary stroke prevention.  Brilinta initiated due to potential allergy of Plavix as DAPT for 3 months recommended.  No stent placement during IR procedure.  At this time, recommend discontinuing Brilinta and continue aspirin alone.  Maintain strict control of hypertension with blood pressure goal below 130/90, diabetes with hemoglobin A1c goal below 6.5% and cholesterol with LDL cholesterol (bad cholesterol) goal below 70 mg/dL.  I also advised the patient to eat a healthy diet with plenty of whole grains, cereals, fruits and vegetables, exercise regularly with at least 30 minutes of continuous activity daily and maintain ideal body weight.  Verbalized understanding.

## 2019-09-02 ENCOUNTER — Other Ambulatory Visit: Payer: Self-pay

## 2019-09-02 NOTE — Patient Outreach (Signed)
First telephone outreach attempt to obtain mRs. No answer. Left message for returned call.   Caylea Foronda Care Management Assistant  

## 2019-09-03 ENCOUNTER — Other Ambulatory Visit: Payer: Self-pay

## 2019-09-03 ENCOUNTER — Ambulatory Visit: Payer: Self-pay | Attending: Critical Care Medicine | Admitting: Critical Care Medicine

## 2019-09-03 ENCOUNTER — Encounter: Payer: Self-pay | Admitting: Critical Care Medicine

## 2019-09-03 VITALS — BP 125/77 | HR 82 | Temp 98.7°F | Ht 71.0 in | Wt 176.0 lb

## 2019-09-03 DIAGNOSIS — I69398 Other sequelae of cerebral infarction: Secondary | ICD-10-CM

## 2019-09-03 DIAGNOSIS — R269 Unspecified abnormalities of gait and mobility: Secondary | ICD-10-CM

## 2019-09-03 DIAGNOSIS — I69391 Dysphagia following cerebral infarction: Secondary | ICD-10-CM

## 2019-09-03 DIAGNOSIS — I10 Essential (primary) hypertension: Secondary | ICD-10-CM

## 2019-09-03 DIAGNOSIS — E785 Hyperlipidemia, unspecified: Secondary | ICD-10-CM

## 2019-09-03 DIAGNOSIS — Z1159 Encounter for screening for other viral diseases: Secondary | ICD-10-CM

## 2019-09-03 DIAGNOSIS — F8081 Childhood onset fluency disorder: Secondary | ICD-10-CM

## 2019-09-03 DIAGNOSIS — G255 Other chorea: Secondary | ICD-10-CM

## 2019-09-03 DIAGNOSIS — I6329 Cerebral infarction due to unspecified occlusion or stenosis of other precerebral arteries: Secondary | ICD-10-CM

## 2019-09-03 DIAGNOSIS — E119 Type 2 diabetes mellitus without complications: Secondary | ICD-10-CM

## 2019-09-03 DIAGNOSIS — E1165 Type 2 diabetes mellitus with hyperglycemia: Secondary | ICD-10-CM

## 2019-09-03 DIAGNOSIS — I69319 Unspecified symptoms and signs involving cognitive functions following cerebral infarction: Secondary | ICD-10-CM

## 2019-09-03 DIAGNOSIS — R270 Ataxia, unspecified: Secondary | ICD-10-CM

## 2019-09-03 LAB — GLUCOSE, POCT (MANUAL RESULT ENTRY): POC Glucose: 159 mg/dl — AB (ref 70–99)

## 2019-09-03 LAB — POCT GLYCOSYLATED HEMOGLOBIN (HGB A1C): Hemoglobin A1C: 6.5 % — AB (ref 4.0–5.6)

## 2019-09-03 MED ORDER — QUETIAPINE FUMARATE 25 MG PO TABS: 50.0000 mg | ORAL_TABLET | Freq: Every day | ORAL | 3 refills | Status: DC

## 2019-09-03 MED ORDER — AMLODIPINE BESYLATE 5 MG PO TABS: 5.0000 mg | ORAL_TABLET | Freq: Every day | ORAL | 2 refills | Status: DC

## 2019-09-03 MED ORDER — METFORMIN HCL 1000 MG PO TABS: 1000.0000 mg | ORAL_TABLET | Freq: Two times a day (BID) | ORAL | 3 refills | Status: DC

## 2019-09-03 MED ORDER — DONEPEZIL HCL 10 MG PO TABS: 10.0000 mg | ORAL_TABLET | Freq: Every day | ORAL | 3 refills | Status: DC

## 2019-09-03 MED ORDER — ATORVASTATIN CALCIUM 40 MG PO TABS: 40.0000 mg | ORAL_TABLET | Freq: Every day | ORAL | 2 refills | Status: DC

## 2019-09-03 NOTE — Patient Instructions (Addendum)
Medication refills were sent to your [facility] pharmacy  Pneumonia shot and tetanus vaccine were given today  Change Seroquel to 50 mg at bedtime  Increase metformin to 1000 mg twice daily  Labs today include blood count metabolic profile HIV and urine for microalbumin  Return in 6 weeks to see Dr. Chapman Fitch to establish for primary care  Outpatient referrals to speech physical therapy and Occupational Therapy were made post stroke  Hepatitis C screening blood test was taken today as well

## 2019-09-03 NOTE — Assessment & Plan Note (Signed)
Cholesterol levels with LDL now at goal

## 2019-09-03 NOTE — Assessment & Plan Note (Signed)
Previous stroke involving the thrombosis of right posterior communicating artery and vertebral artery status post thrombectomy with mechanical removal of large clot  Now with residual deficits  Plan will be for the patient to establish with physical and occupational therapy and also will be referred to speech-language therapy  Plan will be also to continue aspirin 81 mg daily, atorvastatin 40 mg daily, Aricept 10 mg daily,  Note the patient has finished his course of Brilinta and no longer needs that chronically  The patient is quite somnolent today because he is taking Seroquel throughout the day we will change his dose to 50 mg at bedtime

## 2019-09-03 NOTE — Assessment & Plan Note (Addendum)
Gait disturbance with associated ataxia dependent on walker will recommend outpatient physical and occupational therapy

## 2019-09-03 NOTE — Progress Notes (Signed)
Subjective:    Patient ID: Joe Perry, male    DOB: Oct 17, 1950, 68 y.o.   MRN: [id number]  This is a 68 year old male here for post hospital follow-up and a face-to-face exam.  His last visit was a telephone visit in November with one of our APP's.  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  Patient also has type 2 diabetes on Metformin, dyslipidemia on atorvastatin with a goal of LDL less than 70, hypertension on amlodipine 5 mg daily, and urinary incontinence now using diapering supplies.  Note the patient has been seen by neurology and physical medicine and rehab however neither service had yet ordered any outpatient speech physical or occupational therapy.  He was seen briefly by home-based therapy with [facility].  He is here to establish further for primary care so that we can follow through on these needs.  Also the patient spouse complains that he sleeps a lot during the daytime but upon further review he is taking his Seroquel multiple times during the day instead of at bedtime therefore he is not sleeping at night and is sleeping during the day.  Note the patient is on Metformin and his blood glucoses have been well controlled  Blood pressures also been well controlled and today in the office blood pressure is 125/77  Past Medical History:  Diagnosis Date  . Diabetes mellitus without complication (Home)   . Hypertension   . Stroke [facility])      Family History  Problem Relation Age of Onset  . Diabetes Other   . Hypertension Other   . Healthy Mother   . Healthy Father      Social History   Socioeconomic History  . Marital status: Married    Spouse name: Joe Perry  . Number of children: Not on file  . Years of education: Not on file  . Highest education level: Not on file  Occupational History  . Not on file  Tobacco Use  . Smoking status: Never Smoker    . Smokeless tobacco: Never Used  Substance and Sexual Activity  . Alcohol use: No  . Drug use: No  . Sexual activity: Not Currently  Other Topics Concern  . Not on file  Social History Narrative   Lives with wife   Social Determinants of Health   Financial Resource Strain:   . Difficulty of Paying Living Expenses: Not on file  Food Insecurity:   . Worried About Charity fundraiser in the Last Year: Not on file  . Ran Out of Food in the Last Year: Not on file  Transportation Needs:   . Lack of Transportation (Medical): Not on file  . Lack of Transportation (Non-Medical): Not on file  Physical Activity:   . Days of Exercise per Week: Not on file  . Minutes of Exercise per Session: Not on file  Stress:   . Feeling of Stress : Not on file  Social Connections:   . Frequency of Communication with Friends and Family: Not on file  . Frequency of Social Gatherings with Friends and Family: Not on file  . Attends Religious Services: Not on file  . Active Member of Clubs or Organizations: Not on file  . Attends Archivist Meetings: Not on file  . Marital Status: Not on file  Intimate Partner Violence:   . Fear of Current or Ex-Partner: Not on file  .  Emotionally Abused: Not on file  . Physically Abused: Not on file  . Sexually Abused: Not on file     No Known Allergies   Outpatient Medications Prior to Visit  Medication Sig Dispense Refill  . acetaminophen (TYLENOL) 325 MG tablet Take 2 tablets (650 mg total) by mouth every 4 (four) hours as needed for mild pain (or temp > 37.5 C (99.5 F)).    Marland Kitchen aspirin EC 81 MG tablet Take 1 tablet (81 mg total) by mouth daily. 30 tablet 3  . amLODipine (NORVASC) 5 MG tablet Take 1 tablet (5 mg total) by mouth daily. 30 tablet 2  . atorvastatin (LIPITOR) 40 MG tablet Take 1 tablet (40 mg total) by mouth daily at 6 PM. 30 tablet 2  . donepezil (ARICEPT) 10 MG tablet Take 1 tablet (10 mg total) by mouth at bedtime. 30 tablet 3  .  metFORMIN (GLUCOPHAGE) 500 MG tablet Take 1 tablet (500 mg total) by mouth 2 (two) times daily with a meal. 60 tablet 2  . QUEtiapine (SEROQUEL) 25 MG tablet Take 1 tablet (25 mg total) by mouth 2 (two) times daily. 60 tablet 3   No facility-administered medications prior to visit.      Review of Systems Constitutional:   No  weight loss, night sweats,  Fevers, chills, fatigue, lassitude. HEENT:    headaches,  Difficulty swallowing,  Tooth/dental problems,  Sore throat,                No sneezing, itching, ear ache, nasal congestion, post nasal drip,   CV:  No chest pain,  Orthopnea, PND, swelling in lower extremities, anasarca, dizziness, palpitations  GI  No heartburn, indigestion, abdominal pain, nausea, vomiting, diarrhea, change in bowel habits, loss of appetite  Resp: No shortness of breath with exertion or at rest.  No excess mucus, no productive cough,  No non-productive cough,  No coughing up of blood.  No change in color of mucus.  No wheezing.  No chest wall deformity  Skin: no rash or lesions.  GU: no dysuria, change in color of urine, no urgency or frequency.  No flank pain.  MS:  No joint pain or swelling.  No decreased range of motion.  No back pain.  Psych:   change in mood or affect.  depression or anxiety.   memory loss.     Objective:   Physical Exam Vitals:   09/03/19 1037  BP: 125/77  Pulse: 82  Temp: 98.7 F (37.1 C)  TempSrc: Oral  SpO2: 96%  Weight: 176 lb (79.8 kg)  Height: 5\' 11"  (1.803 m)    Gen: somnolent but arousable  ENT: No lesions,  mouth clear,  oropharynx clear, no postnasal drip  Neck: No JVD, no TMG, no carotid bruits  Lungs: No use of accessory muscles, no dullness to percussion, clear without rales or rhonchi  Cardiovascular: RRR, heart sounds normal, no murmur or gallops, no peripheral edema  Abdomen: soft and NT, no HSM,  BS normal  Musculoskeletal: No deformities, no cyanosis or clubbing  Neuro:somnolent through exam.    Some drooling, difficult protruding tongue  Skin: Warm, no lesions or rashes  No results found.   Ct Head Code Stroke Wo Contrast 03/10/2019 1454 1. No acute finding by CT. Extensive chronic small-vessel ischemic changes throughout the brain as outlined above. Old right frontal infarction. 2. ASPECTS is 10.   Ct Angio Head W Or Wo Contrast Ct Angio Neck W Or Wo Contrast Ct Cerebral  Perfusion W Contrast 03/10/2019 1517 1. Fetal type right PCA with proximal occlusion leading to 19 cc of penumbra and no infarct by CT perfusion. There may be a right P1 segment that is also occluded. 2. 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 3. Subjectively advanced left cavernous ICA stenosis. 4. 30-40% atheromatous narrowing at the right ICA bulb.   CT angio/ IR Percutaneous Art Thrombectomy 03/10/2019 S/P 4 vessel cerebral arteriogram followed by completev Revascularization Of RT PCOM and RT PCA with x 1 pass with 62mm x 76mm embotrap retriver device achieving a TICI 2b revascularization. 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 Motion degraded study. 2. Ischemic infarct of the right posterior cerebral artery territory, roughly corresponding to the ischemic volume identified on the earlier CT perfusion scan. 3. No hemorrhage or mass effect. 4. Chronic ischemic microangiopathy and age advanced atrophy.  CT Head WO Contrast 8/72020 Evolving infarct in the right PCA territory. No progression since prior studies. No acute hemorrhage.Atrophy and extensive chronic vessel ischemia.  MRI 05/02/2019 1. Resolving diffusion restriction in the right PCA territory, with patchy residual in the right splenium. Petechial hemorrhage and post ischemic enhancement in the right thalamus and right occipital lobe. No mass effect.    2. Underlying severe chronic ischemic disease with superimposed new small acute to subacute infarcts in the:  - left basal ganglia. - right cerebellar peduncle. 3. 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  2D Echocardiogram 1. The left ventricle has normal systolic function with an ejection fraction of 60-65%. The cavity size was normal. Left ventricular diastolic Doppler parameters are consistent with impaired relaxation. 2. The right ventricle has normal systolic function. The cavity was normal. There is no increase in right ventricular wall thickness. 3. Left atrial size was mildly dilated. 4. Right atrial size was mildly dilated. 5. The aortic root and ascending aorta are normal in size and structure. 6. The interatrial septum was not assessed.  CXR  05/28/2019 1. No radiographic evidence of acute cardiopulmonary disease. 2. Aortic atherosclerosis.        Assessment & Plan:  I personally reviewed all images and lab data in the [facility] system as well as any outside material available during this office visit and agree with the  radiology impressions.   Benign essential HTN Hypertension well-controlled on current program of amlodipine 5 mg daily we will continue same  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 ([place]) Previous stroke involving the thrombosis of right posterior communicating artery and vertebral artery status post thrombectomy with mechanical removal of large clot  Now with residual deficits  Plan will be for the patient to establish with physical and occupational therapy and also will be referred to speech-language therapy  Plan will be also to continue aspirin 81 mg daily, atorvastatin 40 mg daily, Aricept 10 mg daily,  Note the patient has finished his course of Brilinta and no longer needs that chronically  The patient is quite somnolent today because he is taking Seroquel  throughout the day we will change his dose to 50 mg at bedtime    Controlled type 2 diabetes mellitus with hyperglycemia, without long-term current use of insulin ([name]) Glucoses are still running high we will therefore increase Metformin to 1000 mg twice daily.  Note hemoglobin  A1c today was 6.8  Dyslipidemia, goal LDL below 70 Cholesterol levels with LDL now at goal  Gait disturbance, post-stroke Gait disturbance with associated ataxia dependent on walker will recommend outpatient physical and occupational therapy  Stuttering Difficulty with swallowing and stuttering /drooling  rec SLP as outpt   Calieb was seen today for hospitalization follow-up.  Diagnoses and all orders for this visit:  Type 2 diabetes mellitus without complication, without long-term current use of insulin ([name]) -     Glucose (CBG) -     HgB A1c -     metFORMIN (GLUCOPHAGE) 1000 MG tablet; Take 1 tablet (1,000 mg total) by mouth 2 (two) times daily with a meal. -     Comprehensive metabolic panel -     Microalbumin, urine  Essential hypertension -     amLODipine (NORVASC) 5 MG tablet; Take 1 tablet (5 mg total) by mouth daily. -     Comprehensive metabolic panel -     CBC with Differential/Platelet; Future -     CBC with Differential/Platelet  Dyslipidemia, goal LDL below 70 -     atorvastatin (LIPITOR) 40 MG tablet; Take 1 tablet (40 mg total) by mouth daily at 6 PM.  Cognitive deficit, post-stroke -     donepezil (ARICEPT) 10 MG tablet; Take 1 tablet (10 mg total) by mouth at bedtime. -     QUEtiapine (SEROQUEL) 25 MG tablet; Take 2 tablets (50 mg total) by mouth at bedtime. -     Ambulatory referral to Speech Therapy  Dysphagia due to recent stroke -     Ambulatory referral to Speech Therapy  Hemichorea/Hemibalismus LUE -     Ambulatory referral to Physical Therapy -     Ambulatory referral to Occupational Therapy  Ataxia -     Ambulatory referral to Physical Therapy -     Ambulatory  referral to Occupational Therapy  Abnormality of gait  Gait disturbance, post-stroke -     Ambulatory referral to Physical Therapy -     Ambulatory referral to Occupational Therapy  Need for hepatitis C screening test -     Hepatitis C antibody  Benign essential HTN  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

## 2019-09-03 NOTE — Assessment & Plan Note (Signed)
Difficulty with swallowing and stuttering /drooling  rec SLP as outpt

## 2019-09-03 NOTE — Assessment & Plan Note (Signed)
Glucoses are still running high we will therefore increase Metformin to 1000 mg twice daily.  Note hemoglobin A1c today was 6.8

## 2019-09-03 NOTE — Assessment & Plan Note (Signed)
Hypertension well-controlled on current program of amlodipine 5 mg daily we will continue same

## 2019-09-04 ENCOUNTER — Other Ambulatory Visit: Payer: Self-pay

## 2019-09-04 LAB — COMPREHENSIVE METABOLIC PANEL
ALT: 35 IU/L (ref 0–44)
AST: 21 IU/L (ref 0–40)
Albumin/Globulin Ratio: 1.6 (ref 1.2–2.2)
Albumin: 4.6 g/dL (ref 3.8–4.8)
Alkaline Phosphatase: 81 IU/L (ref 39–117)
BUN/Creatinine Ratio: 19 (ref 10–24)
BUN: 18 mg/dL (ref 8–27)
Bilirubin Total: 0.4 mg/dL (ref 0.0–1.2)
CO2: 24 mmol/L (ref 20–29)
Calcium: 10 mg/dL (ref 8.6–10.2)
Chloride: 100 mmol/L (ref 96–106)
Creatinine, Ser: 0.95 mg/dL (ref 0.76–1.27)
GFR calc Af Amer: 96 mL/min/{1.73_m2} (ref >59)
GFR calc non Af Amer: 83 mL/min/{1.73_m2} (ref >59)
Globulin, Total: 2.8 g/dL (ref 1.5–4.5)
Glucose: 141 mg/dL — ABNORMAL HIGH (ref 65–99)
Potassium: 4.5 mmol/L (ref 3.5–5.2)
Sodium: 143 mmol/L (ref 134–144)
Total Protein: 7.4 g/dL (ref 6.0–8.5)

## 2019-09-04 LAB — CBC WITH DIFFERENTIAL/PLATELET
Basophils Absolute: 0.1 10*3/uL (ref 0.0–0.2)
Basos: 1 %
EOS (ABSOLUTE): 0.1 10*3/uL (ref 0.0–0.4)
Eos: 2 %
Hematocrit: 41.4 % (ref 37.5–51.0)
Hemoglobin: 13.5 g/dL (ref 13.0–17.7)
Immature Grans (Abs): 0 10*3/uL (ref 0.0–0.1)
Immature Granulocytes: 0 %
Lymphocytes Absolute: 2.5 10*3/uL (ref 0.7–3.1)
Lymphs: 38 %
MCH: 30.1 pg (ref 26.6–33.0)
MCHC: 32.6 g/dL (ref 31.5–35.7)
MCV: 92 fL (ref 79–97)
Monocytes Absolute: 0.5 10*3/uL (ref 0.1–0.9)
Monocytes: 8 %
Neutrophils Absolute: 3.4 10*3/uL (ref 1.4–7.0)
Neutrophils: 51 %
Platelets: 235 10*3/uL (ref 150–450)
RBC: 4.48 x10E6/uL (ref 4.14–5.80)
RDW: 12.3 % (ref 11.6–15.4)
WBC: 6.6 10*3/uL (ref 3.4–10.8)

## 2019-09-04 LAB — MICROALBUMIN, URINE: Microalbumin, Urine: 27.9 ug/mL

## 2019-09-04 LAB — HEPATITIS C ANTIBODY: Hep C Virus Ab: <0.1 s/co ratio (ref 0.0–0.9)

## 2019-09-04 NOTE — Patient Outreach (Signed)
Telephone outreach to patient to obtain mRS was successfully completed. Spoke with caregiver. MRS=4   [facility]

## 2019-09-09 ENCOUNTER — Telehealth: Payer: Self-pay | Admitting: *Deleted

## 2019-09-09 NOTE — Telephone Encounter (Signed)
-----   Message from Elsie Stain, MD sent at 09/08/2019  8:42 AM EST ----- Please let mr Douthit's spouse know all his labs were normal except for mild elevated blood sugar.  Kidney, liver function normal.  Hep C negative No protein in the urine.

## 2019-09-09 NOTE — Telephone Encounter (Signed)
Patient verified DOB Patient is aware of labs being normal and elevated glucose which is consistent with his improving DM.

## 2019-09-12 ENCOUNTER — Telehealth: Payer: Self-pay | Admitting: Licensed Clinical Social Worker

## 2019-09-12 NOTE — Telephone Encounter (Signed)
Call placed to patient's wife, Casanova Montford. LCSW introduced self and explained role at [facility]. Mrs. Torell was informed of IBH consult from Provider to assess for any behavioral or resource needs.   Mrs. Sansom shared that they have applied for Social Security for patient last month. She received paperwork that she does not fully understand regarding his case.   Mrs. Prats verbalized consent for LCSW to complete referral to Legal Aid to provide assistance. Referral was faxed to [phone].   LCSW provided contact information and encouraged family to reach out with any additional questions or concerns. No additional concerns noted.

## 2019-09-22 NOTE — Telephone Encounter (Signed)
Never returned call

## 2019-10-06 ENCOUNTER — Encounter: Payer: Self-pay | Attending: Registered Nurse | Admitting: Physical Medicine & Rehabilitation

## 2019-10-06 DIAGNOSIS — I69391 Dysphagia following cerebral infarction: Secondary | ICD-10-CM | POA: Insufficient documentation

## 2019-10-06 DIAGNOSIS — I6329 Cerebral infarction due to unspecified occlusion or stenosis of other precerebral arteries: Secondary | ICD-10-CM | POA: Insufficient documentation

## 2019-10-06 DIAGNOSIS — E7849 Other hyperlipidemia: Secondary | ICD-10-CM | POA: Insufficient documentation

## 2019-10-06 DIAGNOSIS — E1165 Type 2 diabetes mellitus with hyperglycemia: Secondary | ICD-10-CM | POA: Insufficient documentation

## 2019-10-06 DIAGNOSIS — I10 Essential (primary) hypertension: Secondary | ICD-10-CM | POA: Insufficient documentation

## 2019-10-15 ENCOUNTER — Encounter: Payer: Self-pay | Admitting: Family Medicine

## 2019-10-15 ENCOUNTER — Ambulatory Visit: Payer: Self-pay | Attending: Family Medicine | Admitting: Family Medicine

## 2019-10-15 ENCOUNTER — Other Ambulatory Visit: Payer: Self-pay

## 2019-10-15 ENCOUNTER — Ambulatory Visit (HOSPITAL_COMMUNITY)
Admission: RE | Admit: 2019-10-15 | Discharge: 2019-10-15 | Disposition: A | Discharge status: Home / Self Care | Payer: Self-pay | Source: Ambulatory Visit | Attending: Family Medicine | Admitting: Family Medicine

## 2019-10-15 VITALS — BP 114/74 | HR 72 | Temp 97.1°F | Resp 16

## 2019-10-15 DIAGNOSIS — R059 Cough, unspecified: Secondary | ICD-10-CM

## 2019-10-15 DIAGNOSIS — R630 Anorexia: Secondary | ICD-10-CM

## 2019-10-15 DIAGNOSIS — R918 Other nonspecific abnormal finding of lung field: Secondary | ICD-10-CM

## 2019-10-15 DIAGNOSIS — R05 Cough: Secondary | ICD-10-CM | POA: Insufficient documentation

## 2019-10-15 DIAGNOSIS — I69391 Dysphagia following cerebral infarction: Secondary | ICD-10-CM

## 2019-10-15 DIAGNOSIS — E119 Type 2 diabetes mellitus without complications: Secondary | ICD-10-CM

## 2019-10-15 DIAGNOSIS — I6930 Unspecified sequelae of cerebral infarction: Secondary | ICD-10-CM

## 2019-10-15 LAB — GLUCOSE, POCT (MANUAL RESULT ENTRY): POC Glucose: 131 mg/dL — AB (ref 70–99)

## 2019-10-15 MED ORDER — AMOXICILLIN-POT CLAVULANATE 500-125 MG PO TABS: 1.0000 | ORAL_TABLET | Freq: Two times a day (BID) | ORAL | 0 refills | Status: AC

## 2019-10-15 NOTE — Progress Notes (Signed)
Subjective:  Patient ID: Joe Perry, male    DOB: [birth year]  Age: 69 y.o. MRN: [id number]  CC: Establish Care, Diabetes, and Hypertension   HPI [name], 69 yo diabetic male with history of CVA with secondary dysphagia, abnormality of gait and cognitive impairment, who for the past 3 to 4 days per wife has had loss of appetite.  She also reports that patient has had an increased cough and upon questioning, cough is often associated with eating.  She does not believe that the patient has had any fever or chills but she has not checked his temperature.  She reports that she was told after patient's last hospitalization that her husband would have a speech evaluation regarding his ability to swallow however she states no one ever followed up with him about this and patient has had no restrictions on what he eats.  She states she does have trouble getting patient to drink water/liquids as he just does not like to drink water.  She reports that he is able to swallow all of his pills.  Patient does have incontinence of urine and wife states that because he does not drink water, his urine often has strong odor.  She reports that his blood sugars remain well controlled.  Past Medical History:  Diagnosis Date  . Diabetes mellitus without complication ([place])   . Hypertension   . Stroke [facility])     Past Surgical History:  Procedure Laterality Date  . IR ANGIO INTRA EXTRACRAN SEL COM CAROTID INNOMINATE UNI L MOD SED  03/10/2019  . IR ANGIO VERTEBRAL SEL SUBCLAVIAN INNOMINATE BILAT MOD SED  03/10/2019  . IR CT HEAD LTD  03/10/2019  . IR PERCUTANEOUS ART THROMBECTOMY/INFUSION INTRACRANIAL INC DIAG ANGIO  03/10/2019  . RADIOLOGY WITH ANESTHESIA N/A 03/10/2019   Procedure: IR WITH ANESTHESIA/CODE STROKE;  Surgeon: Radiologist, Medication, MD;  Location: [place];  Service: Radiology;  Laterality: N/A;    Family History  Problem Relation Age of Onset  . Diabetes Other   . Hypertension Other   . Healthy  Mother   . Healthy Father     Social History   Tobacco Use  . Smoking status: Never Smoker  . Smokeless tobacco: Never Used  Substance Use Topics  . Alcohol use: No    ROS Review of Systems  Unable to perform ROS: Other (Patient with cognitive impairment status post CVAs.  Patient cannot contribute to review of systems)    Objective:   Today's Vitals: BP 114/74   Pulse 72   Temp (!) 97.1 F (36.2 C)   Resp 16   SpO2 99%   Physical Exam Vitals and nursing note reviewed.  Constitutional:      General: He is not in acute distress.    Appearance: Normal appearance.     Comments: Well-nourished well-developed older male in no acute distress sitting in wheelchair and patient is slightly slumped to the left and appears to be sleeping initially.  He is accompanied by his wife who is sitting in a chair behind the patient's wheelchair and she pushes his shoulder several times to get patient to wake up.  Patient says it morning then appears to go back to sleep  Cardiovascular:     Rate and Rhythm: Normal rate and regular rhythm.  Pulmonary:     Effort: Pulmonary effort is normal.     Breath sounds: Rhonchi present.     Comments: Patient has to be repeatedly prompted to take deep breaths.  Patient appears to have some decrease in air movement throughout all lung fields and patient with mild rhonchi in the right mid to lower lung Abdominal:     Palpations: Abdomen is soft.     Tenderness: There is no abdominal tenderness. There is no guarding or rebound.  Musculoskeletal:     Cervical back: Neck supple.  Lymphadenopathy:     Cervical: No cervical adenopathy.  Neurological:     Mental Status: He is alert.  Psychiatric:     Comments: Patient appears to be asleep/somnolent for most of the visit which per wife is his usual baseline     Assessment & Plan:  1. Type 2 diabetes mellitus without complication, without long-term current use of insulin [facility]) Discussed with wife that  patient has had recent follow-up of his diabetes in December and that it is not time to repeat his hemoglobin A1c.  Hemoglobin A1c done 09/03/2019 showed good control of diabetes this patient with hemoglobin of 6.5.  He will have comprehensive metabolic panel repeated at today's visit and follow-up of diabetes and long-term use of medication. Glucose at today's visit was 131. - POCT glucose (manual entry) - Comprehensive metabolic panel  2. Cough; dysphagia post stroke; CVA with residual deficit Per wife, patient has had recurrent cough for months but over the past week or so cough has gotten worse and occurs more often and usually coughing occurs when patient is eating.  Wife was asked to take patient to have chest x-ray due to concern that patient might have aspiration pneumonia.  Wife initially did not understand meaning of aspiration pneumonia as she initially stated that she does not believe he has pneumonia as she has not been taking him around other people.  Discussed with wife that because of his prior stroke, he likely does not swallow foods and liquids properly and that sometimes food or liquids can get into the airway leading to pneumonia.  A prescription was presumptively sent into pharmacy for Augmentin and wife reports that he does not need a liquid or chewable form of the medicine as he is able to swallow his current pills.  Referral is being placed again for speech therapy as patient needs swallowing evaluation due to dysphagia status post CVA with increased risk of aspiration pneumonia. - DG Chest 2 View; Future - Ambulatory referral to Speech Therapy  3. Dysphagia, post-stroke; 5. Loss of appetite; 6.  History of CVA with residual deficit New referral placed for speech therapy for ongoing evaluation of patient's difficulty with swallowing status post CVA and wife has been asked to take patient to have chest x-ray today as patient may have aspiration pneumonia as the cause of his loss of  appetite and recurrent cough.  He additionally may have urinary tract infection his wife reports that he is not drink water often and has an increased odor to the urine.  Augmentin that was prescribed for possible aspiration pneumonia would also cover presence of urinary tract infection.  Unable to obtain urine sample as patient is incontinent of urine per his wife's report. - DG Chest 2 View; Future - Ambulatory referral to Speech Therapy  4. Lung field abnormal finding on examination Patient with abnormal lung sounds on posterior examination of the lungs as he has coarse breath sounds/mild rhonchi at the right mid to lower lung area.  Wife has been asked to take husband for chest x-ray and prescription sent in presumptively for Augmentin.  If patient has continued loss of appetite, worsening  cough, fever or any other concerns, while waiting for x-ray results, patient should be taken to the emergency department for further evaluation and this was also stressed to patient's wife. - DG Chest 2 View; Future - amoxicillin-clavulanate (AUGMENTIN) 500-125 MG tablet; Take 1 tablet (500 mg total) by mouth 2 (two) times daily for 10 days. Take after eating  Dispense: 20 tablet; Refill: 0  Outpatient Encounter Medications as of 10/15/2019  Medication Sig  . acetaminophen (TYLENOL) 325 MG tablet Take 2 tablets (650 mg total) by mouth every 4 (four) hours as needed for mild pain (or temp > 37.5 C (99.5 F)).  Marland Kitchen amLODipine (NORVASC) 5 MG tablet Take 1 tablet (5 mg total) by mouth daily.  Marland Kitchen aspirin EC 81 MG tablet Take 1 tablet (81 mg total) by mouth daily.  Marland Kitchen atorvastatin (LIPITOR) 40 MG tablet Take 1 tablet (40 mg total) by mouth daily at 6 PM.  . donepezil (ARICEPT) 10 MG tablet Take 1 tablet (10 mg total) by mouth at bedtime.  . metFORMIN (GLUCOPHAGE) 1000 MG tablet Take 1 tablet (1,000 mg total) by mouth 2 (two) times daily with a meal.  . QUEtiapine (SEROQUEL) 25 MG tablet Take 2 tablets (50 mg total) by  mouth at bedtime.   No facility-administered encounter medications on file as of 10/15/2019.    An After Visit Summary was printed and given to the patient.  Follow-up: Return for 1-2 weekw if continued cough; ED if fever/SOB or not eating.   Antony Blackbird MD

## 2019-10-15 NOTE — Patient Instructions (Signed)
Aspiration Pneumonia Aspiration pneumonia is an infection in the lungs. It occurs when saliva or liquid contaminated with bacteria is inhaled (aspirated) into the lungs. When these things get into the lungs, swelling (inflammation) and infection can occur. This can make it difficult to breathe. Aspiration pneumonia is a serious condition and can be life threatening. What are the causes? This condition is caused when saliva or liquid from the mouth, throat, or stomach is inhaled into the lungs, and when those fluids are contaminated with bacteria. What increases the risk? The following factors may make you more likely to develop this condition:  A narrowing of the tube that carries food to the stomach (esophageal narrowing).  Having gastroesophageal reflux disease (GERD).  Having a weak immune system.  Having diabetes.  Having poor oral hygiene.  Being malnourished. The condition is more likely to occur when a person's cough (gag) reflex, or ability to swallow, has decreased. Some things that can cause this decrease include:  Having a brain injury or disease, such as stroke, seizures, Parkinson disease, dementia, or amyotrophic lateral sclerosis (ALS).  Being given a general anesthetic for procedures.  Drinking too much alcohol. If a person passes out and vomits, vomit can be inhaled into the lungs.  Taking certain medicines, such as tranquilizers or sedatives. What are the signs or symptoms? Symptoms of this condition include:  Fever.  A cough with secretions that are yellow, tan, or green.  Breathing problems, such as wheezing or shortness of breath.  Chest pain.  Being more tired than usual (fatigue).  Having a history of coughing while eating or drinking.  Bad breath.  Bluish color to the lips, skin, or fingers. How is this diagnosed? This condition may be diagnosed based on:  A physical exam.  Tests, such as: ? Chest X-ray. ? Sputum culture. Saliva and mucus  (sputum) are collected from the lungs or the tubes that carry air to the lungs (bronchi). The sputum is then tested for bacteria. ? Oximetry. A sensor or clip is placed on areas such as a finger, earlobe, or toe to measure the oxygen level in your blood. ? Blood tests. ? Swallowing study. This test looks at how food is swallowed and whether it goes into your breathing tube (trachea) or esophagus. ? Bronchoscopy. This test uses a flexible tube (bronchoscope) to see inside the lungs. How is this treated? This condition may be treated with:  Medicines. Antibiotic medicine will be given to kill the pneumonia bacteria. Other medicines may also be used to reduce fever or pain.  Breathing assistance and oxygen therapy. Depending on how well you are breathing, you may need to be given oxygen, or you may need breathing support from a breathing machine (ventilator).  Thoracentesis. This is a procedure to remove fluid that has built up in the space between the linings of the chest wall and the lungs.  Feeding tube and diet change. For people who have difficulty swallowing, a feeding tube might be placed in the stomach, or they may be asked to avoid certain food textures or liquids when eating. Follow these instructions at home: Medicines  Take over-the-counter and prescription medicines only as told by your health care provider. ? If you were prescribed an antibiotic medicine, take it as told by your health care provider. Do not stop taking the antibiotic even if you start to feel better. ? Take cough medicine only if you are losing sleep. Cough medicine can prevent your body's natural ability to remove mucus   from your lungs. General instructions  Carefully follow any eating instructions you were given, such as avoiding certain food textures or thickening your liquids. Thickening liquids reduces the risk of developing aspiration pneumonia again.  Use breathing exercises such as postural drainage, deep  breathing, and incentive spirometry to help expel secretions.  Rest as instructed by your health care provider.  Sleep in a semi-upright position at night. Try to sleep in a reclining chair, or place a few pillows under your head.  Do not use any products that contain nicotine or tobacco, such as cigarettes and e-cigarettes. If you need help quitting, ask your health care provider.  Keep all follow-up visits as told by your health care provider. This is important. Contact a health care provider if:  You have a fever.  You have a worsening cough with yellow, tan, or green secretions.  You have coughing while eating or drinking. Get help right away if:  You have worsening shortness of breath, wheezing, or difficulty breathing.  You have chest pain. Summary  Aspiration pneumonia is an infection in the lungs. It is caused when saliva or liquid from the mouth, throat, or stomach is inhaled into the lungs.  Aspiration pneumonia is more likely to occur when a person's cough reflex or ability to swallow has decreased.  Symptoms of aspiration pneumonia include coughing, breathing problems, fever, and chest pain.  Aspiration pneumonia may be treated with antibiotic medicine, other medicines to reduce pain or fever, and breathing assistance or oxygen therapy. This information is not intended to replace advice given to you by your health care provider. Make sure you discuss any questions you have with your health care provider. Document Revised: 08/03/2017 Document Reviewed: 09/26/2016 Elsevier Patient Education  2020 Elsevier Inc.  

## 2019-10-16 LAB — COMPREHENSIVE METABOLIC PANEL WITH GFR
ALT: 10 IU/L (ref 0–44)
AST: 13 IU/L (ref 0–40)
Albumin/Globulin Ratio: 1.7 (ref 1.2–2.2)
Albumin: 4.2 g/dL (ref 3.8–4.8)
Alkaline Phosphatase: 64 IU/L (ref 39–117)
BUN/Creatinine Ratio: 17 (ref 10–24)
BUN: 14 mg/dL (ref 8–27)
Bilirubin Total: 0.4 mg/dL (ref 0.0–1.2)
CO2: 23 mmol/L (ref 20–29)
Calcium: 9.8 mg/dL (ref 8.6–10.2)
Chloride: 105 mmol/L (ref 96–106)
Creatinine, Ser: 0.81 mg/dL (ref 0.76–1.27)
GFR calc Af Amer: 107 mL/min/1.73
GFR calc non Af Amer: 93 mL/min/1.73
Globulin, Total: 2.5 g/dL (ref 1.5–4.5)
Glucose: 137 mg/dL — ABNORMAL HIGH (ref 65–99)
Potassium: 4.9 mmol/L (ref 3.5–5.2)
Sodium: 144 mmol/L (ref 134–144)
Total Protein: 6.7 g/dL (ref 6.0–8.5)

## 2019-10-17 ENCOUNTER — Telehealth (INDEPENDENT_AMBULATORY_CARE_PROVIDER_SITE_OTHER): Payer: Self-pay

## 2019-10-17 NOTE — Telephone Encounter (Signed)
-----   Message from Antony Blackbird, MD sent at 10/16/2019  7:28 PM EST ----- Normal chest x-ray

## 2019-10-17 NOTE — Telephone Encounter (Signed)
Patients wife verified patients date of birth. She then placed the call on speaker for patient to hear. Both patient and wife are aware that the chest Xray was normal. Nat Christen, CMA

## 2019-10-25 ENCOUNTER — Ambulatory Visit: Payer: Self-pay | Attending: Internal Medicine

## 2019-10-25 DIAGNOSIS — Z23 Encounter for immunization: Secondary | ICD-10-CM | POA: Insufficient documentation

## 2019-10-25 NOTE — Progress Notes (Signed)
   Covid-19 Vaccination Clinic  Name:  MARKS LANGOLF Sr.    MRN: [id number] DOB: 02-21-1951  10/25/2019  Mr. Mohiuddin was observed post Covid-19 immunization for 15 minutes without incidence. He was provided with Vaccine Information Sheet and instruction to access the V-Safe system.   Mr. Bloodsworth was instructed to call 911 with any severe reactions post vaccine: Marland Kitchen Difficulty breathing  . Swelling of your face and throat  . A fast heartbeat  . A bad rash all over your body  . Dizziness and weakness    Immunizations Administered    Name Date Dose VIS Date Route   Pfizer COVID-19 Vaccine 10/25/2019 10:19 AM 0.3 mL 08/15/2019 Intramuscular   Manufacturer: [place]   Lot: [id number]   [place]: [id number]

## 2019-10-31 ENCOUNTER — Other Ambulatory Visit: Payer: Self-pay

## 2019-10-31 ENCOUNTER — Ambulatory Visit: Payer: Self-pay | Attending: Family Medicine | Admitting: Family Medicine

## 2019-10-31 VITALS — BP 135/79 | HR 58 | Ht 71.0 in | Wt 171.0 lb

## 2019-10-31 DIAGNOSIS — E119 Type 2 diabetes mellitus without complications: Secondary | ICD-10-CM

## 2019-10-31 DIAGNOSIS — R1310 Dysphagia, unspecified: Secondary | ICD-10-CM | POA: Insufficient documentation

## 2019-10-31 DIAGNOSIS — R630 Anorexia: Secondary | ICD-10-CM

## 2019-10-31 DIAGNOSIS — R269 Unspecified abnormalities of gait and mobility: Secondary | ICD-10-CM

## 2019-10-31 DIAGNOSIS — Z79899 Other long term (current) drug therapy: Secondary | ICD-10-CM | POA: Insufficient documentation

## 2019-10-31 DIAGNOSIS — I69354 Hemiplegia and hemiparesis following cerebral infarction affecting left non-dominant side: Secondary | ICD-10-CM | POA: Insufficient documentation

## 2019-10-31 DIAGNOSIS — Z833 Family history of diabetes mellitus: Secondary | ICD-10-CM | POA: Insufficient documentation

## 2019-10-31 DIAGNOSIS — I10 Essential (primary) hypertension: Secondary | ICD-10-CM | POA: Insufficient documentation

## 2019-10-31 DIAGNOSIS — I69391 Dysphagia following cerebral infarction: Secondary | ICD-10-CM

## 2019-10-31 DIAGNOSIS — E11649 Type 2 diabetes mellitus with hypoglycemia without coma: Secondary | ICD-10-CM

## 2019-10-31 DIAGNOSIS — Z6823 Body mass index (BMI) 23.0-23.9, adult: Secondary | ICD-10-CM | POA: Insufficient documentation

## 2019-10-31 DIAGNOSIS — I69398 Other sequelae of cerebral infarction: Secondary | ICD-10-CM

## 2019-10-31 DIAGNOSIS — Z7982 Long term (current) use of aspirin: Secondary | ICD-10-CM | POA: Insufficient documentation

## 2019-10-31 DIAGNOSIS — Z7984 Long term (current) use of oral hypoglycemic drugs: Secondary | ICD-10-CM | POA: Insufficient documentation

## 2019-10-31 DIAGNOSIS — R296 Repeated falls: Secondary | ICD-10-CM

## 2019-10-31 LAB — GLUCOSE, POCT (MANUAL RESULT ENTRY)
POC Glucose: 66 mg/dL — AB (ref 70–99)
POC Glucose: 118 mg/dL — AB (ref 70–99)

## 2019-10-31 NOTE — Progress Notes (Signed)
States that he does not want to eat or take his medication.

## 2019-10-31 NOTE — Progress Notes (Signed)
Subjective:  Patient ID: Joe Eddy Sr., male    DOB: 1951/05/22  Age: 69 y.o. MRN: [id number]  CC: No chief complaint on file.   HPI Joe Eddy Sr., 69 yo male with Type 2 DM, HTN,  History of CVA with residual deficits including left sided weakness, gait abnormality, cognitive impairment,  left upper extremity involuntary movements and dysphagia who is being seen in follow-up of cough and decreased appetitie with suspected aspiration pneumonia. CXR was obtained after visit and showed no acute abnormality. Wife had also stated that patient with an increased odor to the urine at the last visit but UA could not be obtained due to patient's issues with urinary incontinence s/p CVA.         At today's visit, wife reports that patient's cough is resolved and that he appears to feel better than at the prior visit.  Wife has noticed that patient continues to have issues with chewing and swallowing as she states that often she will notice that he will continue to hold food in his mouth and not swallow it.  When he does swallow, he is not having any issues with recurrent cough since the last visit.  Appetite remains somewhat decreased as it takes a while for patient to eat.  Wife also reports that patient is refusing his medications at times.  She has tried putting his medications in applesauce and she states that she was told that she could try using pudding to help patient take his medications but he continues to have recent issues with refusing to take medications.  She states that prior to today's visit, patient had requested porridge for breakfast which she made but patient only took 1-2 bites.  She thinks that this is why his blood sugar is low at today's visit.         She reports that patient had a recent fall without injury.  Patient is supposed to use his walker when he attempts to stand/ambulate however wife reports that he will instead sit on the side of his bed and use the handrail to try  and scoot forward and when he did this recently, this caused him to slip off of the side of the bed and landed on his backside on the floor.  He also tries to do this when getting up from a chair/seated position.  He will continually try to scoot forward instead of grabbing onto the walker for support with standing up.  Wife would be interested in physical therapy referral to help patient with his gait/transfers.           Patient says that he feels fine at today's visit.  He denies any joint pain, back pain, stomach pain or sore throat.  He denies cough or shortness of breath.          Wife reports that he was recently approved for Medicare- Medicaid and she has paperwork with her today that she would like to have completed so that patient can receive home health services as she states that since his stroke, she has been his only caregiver and she is exhausted.  She reports that patient does continue to have increased odor to the urine after the antibiotic treatment for his cough that was thought to be due to aspiration pneumonia.  She believes increased odor at this time is more so related to patient refusing to drink water.  She does try to offer him water/beverages throughout the day but he is  currently only having minimal intake of fluids.          Past Medical History:  Diagnosis Date  . Diabetes mellitus without complication ([place])   . Hypertension   . Stroke [facility])     Past Surgical History:  Procedure Laterality Date  . IR ANGIO INTRA EXTRACRAN SEL COM CAROTID INNOMINATE UNI L MOD SED  03/10/2019  . IR ANGIO VERTEBRAL SEL SUBCLAVIAN INNOMINATE BILAT MOD SED  03/10/2019  . IR CT HEAD LTD  03/10/2019  . IR PERCUTANEOUS ART THROMBECTOMY/INFUSION INTRACRANIAL INC DIAG ANGIO  03/10/2019  . RADIOLOGY WITH ANESTHESIA N/A 03/10/2019   Procedure: IR WITH ANESTHESIA/CODE STROKE;  Surgeon: Radiologist, Medication, MD;  Location: [place];  Service: Radiology;  Laterality: N/A;    Family History  Problem  Relation Age of Onset  . Diabetes Other   . Hypertension Other   . Healthy Mother   . Healthy Father     Social History   Tobacco Use  . Smoking status: Never Smoker  . Smokeless tobacco: Never Used  Substance Use Topics  . Alcohol use: No    ROS Review of Systems  Constitutional: Positive for appetite change and fatigue (improved). Negative for chills and fever.  HENT: Positive for trouble swallowing. Negative for sore throat.   Respiratory: Negative for cough and shortness of breath.   Cardiovascular: Negative for chest pain and palpitations.  Gastrointestinal: Negative for abdominal pain, blood in stool, constipation, diarrhea and nausea.  Endocrine: Negative for polydipsia, polyphagia and polyuria.  Genitourinary: Negative for dysuria and frequency.  Musculoskeletal: Positive for arthralgias and gait problem. Negative for back pain.  Neurological: Positive for numbness. Negative for dizziness and headaches.  Hematological: Negative for adenopathy. Does not bruise/bleed easily.    Objective:   Today's Vitals: BP 135/79   Pulse (!) 58   Ht 5\' 11"  (1.803 m)   Wt 171 lb (77.6 kg)   SpO2 99%   BMI 23.85 kg/m   Physical Exam Vitals and nursing note reviewed.  Constitutional:      General: He is not in acute distress.    Appearance: He is normal weight. He is not ill-appearing.     Comments: WNWD older male in NAD sitting in wheelchair in exam room. He had been given glucose gel due to hypoglycemia after having low glucose on FSBS done along with vital signs. Wife had to ask patient then hold his jaws before patient would swallow the glucose gel that he was holding in his mouth.   Neck:     Vascular: No carotid bruit.  Cardiovascular:     Rate and Rhythm: Normal rate and regular rhythm.  Pulmonary:     Effort: Pulmonary effort is normal.     Breath sounds: Normal breath sounds.     Comments: Mild decrease in breath sounds at the lung bases but no rhonci Abdominal:       Palpations: Abdomen is soft.     Tenderness: There is no abdominal tenderness. There is no right CVA tenderness, left CVA tenderness, guarding or rebound.  Musculoskeletal:     Cervical back: No tenderness.  Lymphadenopathy:     Cervical: No cervical adenopathy.  Skin:    General: Skin is warm and dry.  Neurological:     Mental Status: He is alert.     Motor: Weakness present.     Gait: Gait abnormal.     Comments: Left sided weakness s/p CVA; patient is more alert than at prior visit. Gives  very short 1-2 word answers  Psychiatric:        Mood and Affect: Mood normal.        Behavior: Behavior normal.     Assessment & Plan:  1. Type 2 diabetes mellitus without complication, without long-term current use of insulin ([place]); 2. Hypoglycemia related to Type 2 DM ([name]) Continue to closely monitor blood sugars and dose of metformin may need to be lowered or stopped if patient with continued decreased appetite. Patient with blood sugar at today's visit of 66 initially and patient was given glucose gel and BS improved to 118 on recheck. Last Hgb A1c on 09/03/2019 was 6.5.  - POCT glucose (manual entry) - Ambulatory referral to [place] - POCT glucose (manual entry)  3. Dysphagia, post-stroke Referral has been made for home health speech therapy and per wife, patient will be seen next week. He is also being referred for OT/PT and nursing. He has not had recent Neurology follow-up s/p CVA therefore Neurology referral also placed - Ambulatory referral to [place] - Ambulatory referral to Neurology  4. Loss of appetite Mild improvement status post treatment for possible aspiration pneumonia. He also has an upcoming speech therapy evaluation. He may need GI referral if continued decrease in appetite, weight loss or if swallowing is deemed unsafe   5. Gait disturbance, post-stroke; 6. Recurrent falls Home health referral for PT/OT to help with gait training/transfers and home safety.  Neurology referral in follow-up of CVA. - Ambulatory referral to [place] - Ambulatory referral to Neurology    Outpatient Encounter Medications as of 10/31/2019  Medication Sig  . acetaminophen (TYLENOL) 325 MG tablet Take 2 tablets (650 mg total) by mouth every 4 (four) hours as needed for mild pain (or temp > 37.5 C (99.5 F)).  Marland Kitchen amLODipine (NORVASC) 5 MG tablet Take 1 tablet (5 mg total) by mouth daily.  Marland Kitchen aspirin EC 81 MG tablet Take 1 tablet (81 mg total) by mouth daily.  Marland Kitchen atorvastatin (LIPITOR) 40 MG tablet Take 1 tablet (40 mg total) by mouth daily at 6 PM.  . donepezil (ARICEPT) 10 MG tablet Take 1 tablet (10 mg total) by mouth at bedtime.  . metFORMIN (GLUCOPHAGE) 1000 MG tablet Take 1 tablet (1,000 mg total) by mouth 2 (two) times daily with a meal.  . QUEtiapine (SEROQUEL) 25 MG tablet Take 2 tablets (50 mg total) by mouth at bedtime.   No facility-administered encounter medications on file as of 10/31/2019.    An After Visit Summary was printed and given to the patient/wife.   Follow-up: Return in about 10 weeks (around 01/09/2020) for chronic issues; sooner if needed.    Antony Blackbird MD

## 2019-11-04 ENCOUNTER — Encounter: Payer: Self-pay | Admitting: Occupational Therapy

## 2019-11-04 ENCOUNTER — Ambulatory Visit: Payer: 59 | Attending: Critical Care Medicine | Admitting: Physical Therapy

## 2019-11-04 ENCOUNTER — Ambulatory Visit: Payer: 59 | Admitting: Occupational Therapy

## 2019-11-04 ENCOUNTER — Other Ambulatory Visit: Payer: Self-pay

## 2019-11-04 ENCOUNTER — Encounter: Payer: Self-pay | Admitting: Family Medicine

## 2019-11-04 DIAGNOSIS — R2689 Other abnormalities of gait and mobility: Secondary | ICD-10-CM

## 2019-11-04 DIAGNOSIS — R41842 Visuospatial deficit: Secondary | ICD-10-CM | POA: Insufficient documentation

## 2019-11-04 DIAGNOSIS — R1312 Dysphagia, oropharyngeal phase: Secondary | ICD-10-CM | POA: Insufficient documentation

## 2019-11-04 DIAGNOSIS — R41844 Frontal lobe and executive function deficit: Secondary | ICD-10-CM

## 2019-11-04 DIAGNOSIS — R41840 Attention and concentration deficit: Secondary | ICD-10-CM | POA: Insufficient documentation

## 2019-11-04 DIAGNOSIS — I69319 Unspecified symptoms and signs involving cognitive functions following cerebral infarction: Secondary | ICD-10-CM | POA: Insufficient documentation

## 2019-11-04 DIAGNOSIS — R278 Other lack of coordination: Secondary | ICD-10-CM | POA: Insufficient documentation

## 2019-11-04 DIAGNOSIS — R41841 Cognitive communication deficit: Secondary | ICD-10-CM | POA: Diagnosis present

## 2019-11-04 DIAGNOSIS — M6281 Muscle weakness (generalized): Secondary | ICD-10-CM

## 2019-11-04 DIAGNOSIS — R2681 Unsteadiness on feet: Secondary | ICD-10-CM

## 2019-11-04 DIAGNOSIS — R471 Dysarthria and anarthria: Secondary | ICD-10-CM | POA: Insufficient documentation

## 2019-11-04 NOTE — Therapy (Signed)
[facility] [address] [place], [place], [address] Phone: [phone]   Fax:  [phone]  Physical Therapy Evaluation  Patient Details  Name: Joe FENGER Sr. MRN: [id number] Date of Birth: 1950-11-24 Referring Provider (PT): Asencion Noble, MD   Encounter Date: 11/04/2019  PT End of Session - 11/04/19 1346    Visit Number  1    Number of Visits  9    Date for PT Re-Evaluation  02/02/20   for 8 week POC   Authorization Type  Medicare Part A only; Medicare Part B takes effect 03/04/2020 (pt will be SELF-PAY) until then    PT Start Time  0854    PT Stop Time  0928    PT Time Calculation (min)  34 min    Equipment Utilized During Treatment  Gait belt    Behavior During Therapy  Flat affect       Past Medical History:  Diagnosis Date  . Diabetes mellitus without complication ([place])   . Hypertension   . Stroke [facility])     Past Surgical History:  Procedure Laterality Date  . IR ANGIO INTRA EXTRACRAN SEL COM CAROTID INNOMINATE UNI L MOD SED  03/10/2019  . IR ANGIO VERTEBRAL SEL SUBCLAVIAN INNOMINATE BILAT MOD SED  03/10/2019  . IR CT HEAD LTD  03/10/2019  . IR PERCUTANEOUS ART THROMBECTOMY/INFUSION INTRACRANIAL INC DIAG ANGIO  03/10/2019  . RADIOLOGY WITH ANESTHESIA N/A 03/10/2019   Procedure: IR WITH ANESTHESIA/CODE STROKE;  Surgeon: Radiologist, Medication, MD;  Location: [facility];  Service: Radiology;  Laterality: N/A;    There were no vitals filed for this visit.   Subjective Assessment - 11/04/19 0858    Subjective  Pt's wife reports he doesn't push up from the chair for transfers; and she would like him to walk on his own.  This recent stroke was in July 2020.  Pt wife R side is weaker (but MD notes state L side is weaker).  Uses walker at home; prior to CVA, he was independent.  He has had 2 falls; one fall last week getting up from the bed.  Was able to get up with wife's assistance.    Patient is accompained by:   Family member   wife   Patient Stated Goals  Pt's goals for therapy are to walk better. (pt agrees with wife's statement)    Currently in Pain?  No/denies         [facility] PT Assessment - 11/04/19 0905      Assessment   Medical Diagnosis  Hemichorea, ataxia, gait disturbance post stroke    Referring Provider (PT)  Asencion Noble, MD    Onset Date/Surgical Date  09/03/19   PT referral; CVA events 03/2019, 11/2018   Hand Dominance  Right    Prior Therapy  "a few home health"      Precautions   Precautions  Fall      Balance Screen   Has the patient fallen in the past 6 months  Yes    How many times?  2    Has the patient had a decrease in activity level because of a fear of falling?   No    Is the patient reluctant to leave their home because of a fear of falling?   No      Home Environment   Living Environment  Private residence    Living Arrangements  Spouse/significant other    Available Help at Discharge  Family  Type of [place] to enter    Entrance Stairs-Number of Steps  11    Entrance Stairs-Rails  Right;Left   6 steps one rail; 5 steps both rails   Home Layout  One level   two level in back   [place] - 2 wheels;Bedside commode;Shower seat;Wheelchair - manual      Prior Function   Level of Independence  Independent    Vocation  Retired   Retired professor at [employer]  --      Observation/Other Assessments   Focus on Therapeutic Outcomes (FOTO)   NA      ROM / Strength   AROM / PROM / Strength  Strength      Strength   Overall Strength  Deficits    Overall Strength Comments  Difficulty following command for MMT BLE    Strength Assessment Site  Hip;Knee;Ankle    Right/Left Hip  Right;Left    Right Hip Flexion  3-/5    Left Hip Flexion  4/5    Right/Left Knee  Right;Left    Right Knee Flexion  3+/5    Right Knee Extension  3+/5    Left Knee Flexion  4/5    Left Knee Extension  4/5     Right/Left Ankle  Right;Left      Transfers   Transfers  Sit to Stand;Stand to Sit    Sit to Stand  4: Min assist;With upper extremity assist;From chair/3-in-1;From bed    Sit to Stand Details  Tactile cues for sequencing;Tactile cues for placement;Verbal cues for technique;Verbal cues for sequencing    Sit to Stand Details (indicate cue type and reason)  Pt tends to start transfer with hands on walker; needs tactile cues to place hands on seat to push up to stand.    Stand to Sit  4: Min assist;With upper extremity assist;To chair/3-in-1;To bed    Stand to Sit Details (indicate cue type and reason)  Tactile cues for sequencing;Tactile cues for placement;Verbal cues for technique;Verbal cues for sequencing    Stand to Sit Details  Cues for hand placement for sitting      Ambulation/Gait   Ambulation/Gait  Yes    Ambulation/Gait Assistance  4: Min guard;4: Min assist    Ambulation/Gait Assistance Details  Pt veers to R with gait, needing assistance to avoid obstacles on R x 2.    Ambulation Distance (Feet)  80 Feet    Assistive device  Rolling walker    Gait Pattern  Step-through pattern;Decreased step length - right;Decreased step length - left;Poor foot clearance - left;Poor foot clearance - right;Ataxic    Ambulation Surface  Level;Indoor    Gait velocity  23.53 sec = 1.39 ft/sec      Standardized Balance Assessment   Standardized Balance Assessment  Timed Up and Go Test      Timed Up and Go Test   TUG  Normal TUG    Normal TUG (seconds)  44.41    TUG Comments  Scores >13.5 seconds indicates increased fall risk; >30 seconds indicates increased difficultyw ith ADLs in home.                Objective measurements completed on examination: See above findings.                PT Short Term Goals - 11/04/19 1407      PT SHORT  TERM GOAL #1   Title  Pt will perform HEP with wife's assistance, for improved strength, balance, gait.  TARGET 12/05/2019 (may be delayed due  to scheduling)    Time  4    Period  Weeks    Status  New    Target Date  12/05/19      PT SHORT TERM GOAL #2   Title  Pt will perform at least 4 of 5 trials of sit<>stand with correct sequencing/hand placement, supervision, for improved transfer safety and efficiency.    Time  4    Period  Weeks    Status  New    Target Date  12/05/19      PT SHORT TERM GOAL #3   Title  Pt will improve TUG score to less than or equal to 35 seconds for decreased fall risk.    Time  4    Period  Weeks    Status  New    Target Date  12/05/19      PT SHORT TERM GOAL #4   Title  Pt will ambulate at least 300 ft, indoor/outdoor surfaces, with min guard assistance, for improved short distance community gait.    Time  4    Period  Weeks    Status  New      PT SHORT TERM GOAL #5   Title  Pt/wife will verbalize understanding of fall prevention in home environment.    Time  4    Period  Weeks    Status  New        PT Long Term Goals - 11/04/19 1411      PT LONG TERM GOAL #1   Title  Pt will perform progressive HEP with family supervision and assistance for improved balance, strength, gait.  Target 01/02/2020    Time  8    Period  Weeks    Status  New    Target Date  01/02/20      PT LONG TERM GOAL #2   Title  Pt will improve gait velocity to at least 1.8 ft/sec for improved gait efficiency and safety.    Time  8    Period  Weeks    Status  New    Target Date  01/02/20      PT LONG TERM GOAL #3   Title  Pt will improve TUG score to less than or equal to 25 seconds for decreased fall risk.    Time  8    Period  Weeks    Status  New    Target Date  01/02/20             Plan - 11/04/19 1348    Clinical Impression Statement  Pt is a 69 year old male who presents to [facility] with history of CVA, residual L sided weakness, dysphagia, difficulty ambulating, hx of falls.  Wife reports 2 falls in the past 6 months.  Pt presents with decreased strength in lower extremities and in trunk,  decreased balance, decreased safety awareness, decreased gait and transfer safety.  Pt demonstrates fall risk per TUG and gait velocity scores.  Prior to pt's CVA, wife reports pt was independent and teaching as a professor at [employer].  He will benefit from skilled PT to address the above stated deficits to decrease fall risk and improve functional mobility.    Personal Factors and Comorbidities  Comorbidity 3+    Comorbidities  DM, HTN, CVA, dsyphagia    Examination-Activity  Limitations  Locomotion Level;Transfers;Stand;Stairs    Examination-Participation Restrictions  Community Activity;Other   Work   [employer]chant [employer] officer  Evolving/Moderate complexity    Clinical Decision Making  Moderate    Rehab Potential  Fair   cognitive impairments, decreased safety awareness, severity of deficits   PT Frequency  1x / week    PT Duration  8 weeks   plus eval   PT Treatment/Interventions  ADLs/Self Care Home Management;DME Instruction;Neuromuscular re-education;Balance training;Therapeutic exercise;Therapeutic activities;Functional mobility training;Stair training;Gait training;Patient/family education    PT Next Visit Plan  Initiate HEP for lower extremity and trunk strengthening; transfer training, gait training; wife education for safety with transfers and gait    Consulted and Agree with Plan of Care  Patient;Family member/caregiver    Family Member Consulted  wife       Patient will benefit from skilled therapeutic intervention in order to improve the following deficits and impairments:  Abnormal gait, Decreased coordination, Difficulty walking, Decreased safety awareness, Decreased balance, Decreased strength, Decreased mobility  Visit Diagnosis: Other abnormalities of gait and mobility  Unsteadiness on feet  Muscle weakness (generalized)     Problem List Patient Active Problem List   Diagnosis Date Noted  . Benign essential HTN   . Cognitive deficit, post-stroke    . Dysphagia, post-stroke   . Controlled type 2 diabetes mellitus with hyperglycemia, without long-term current use of insulin ([place])   . 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 ([place]) 06/03/2019  . 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 Dyslipidemia, goal LDL below 70 03/12/2019  . Dysphagia due to recent stroke 03/12/2019  . Overweight 03/12/2019  . 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 Abnormality of gait   . Spasticity   . Gait disturbance, post-stroke   . Cerebrovascular accident (CVA) due to occlusion of vertebral artery ([place])   . Essential hypertension 01/31/2016  . 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    MARRIOTT,AMY W. 11/04/2019, 2:14 PM Frazier Butt., PT [facility] [address] [place], [place], [address] Phone: [phone]   Fax:  [phone]  Name: Joe KURT Sr. MRN: [id number] Date of Birth: August 14, 1951

## 2019-11-04 NOTE — Therapy (Signed)
[facility] [address] [place], [place], [address] Phone: [phone]   Fax:  [phone]  Occupational Therapy Evaluation  Patient Details  Name: Joe NUSE Sr. MRN: [id number] Date of Birth: [birth year] Referring Provider (OT): Dr. Asencion Noble   Encounter Date: 11/04/2019  OT End of Session - 11/04/19 0952    Visit Number  1    Number of Visits  9    Date for OT Re-Evaluation  01/03/20    Authorization Type  Self Pay (only Medicare A, Medicare B doesn't start until July)    Authorization - Visit Number  1    Authorization - Number of Visits  10    Progress Note Due on Visit  10    OT Start Time  0804    OT Stop Time  0848    OT Time Calculation (min)  44 min    Activity Tolerance  Patient limited by lethargy    Behavior During Therapy  Flat affect       Past Medical History:  Diagnosis Date  . Diabetes mellitus without complication ([place])   . Hypertension   . Stroke [facility])     Past Surgical History:  Procedure Laterality Date  . IR ANGIO INTRA EXTRACRAN SEL COM CAROTID INNOMINATE UNI L MOD SED  03/10/2019  . IR ANGIO VERTEBRAL SEL SUBCLAVIAN INNOMINATE BILAT MOD SED  03/10/2019  . IR CT HEAD LTD  03/10/2019  . IR PERCUTANEOUS ART THROMBECTOMY/INFUSION INTRACRANIAL INC DIAG ANGIO  03/10/2019  . RADIOLOGY WITH ANESTHESIA N/A 03/10/2019   Procedure: IR WITH ANESTHESIA/CODE STROKE;  Surgeon: Radiologist, Medication, MD;  Location: [facility];  Service: Radiology;  Laterality: N/A;    There were no vitals filed for this visit.  Subjective Assessment - 11/04/19 0815    Subjective   Wife reports that pt's medication makes him sleepy    Pertinent History  CVA.     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  No/denies        [facility] OT Assessment - 11/04/19 0001      Assessment   Medical Diagnosis  CVA    Referring Provider (OT)  Dr. Asencion Noble    Onset Date/Surgical Date  --   march 2020, referral 09/03/19   Hand Dominance  Right    Prior Therapy  "a few home health"      Precautions   Precautions  Fall      Balance Screen   Has the patient fallen in the past 6 months  Yes    How many times?  1   got up to go to bathroom alone last week     [place] expects to be discharged to:  Private residence    Living Arrangements  Spouse/significant other    Lives With  Spouse      Prior Function   Level of [place]  Retired   professor at [employer], was substituting prior to CVA     ADL   Eating/Feeding  Modified independent    Upper Body Bathing  Minimal assistance   for back   Lower Body Bathing  Minimal assistance  cueing   Upper Body Dressing  --   mod I per wife, assistance for buttons   Upper Body Dressing Details  doffed jacket with min cueing for location of zipper, decr use of LUE, and incr time    Lower Body Dressing  Modified independent    Toilet Transfer  Supervision/safety    Toileting - Clothing Manipulation  Minimal assistance    Toileting -  Hygiene  Minimal assistance    Tub/Shower Transfer  Supervision/safety   cueing   Physicist, medical seat with back   tub/shower combination   ADL comments  Pt needs cueing for ADLs due to cognitive deficits      IADL   Community Mobility  Relies on family or friends for transportation    Medication Management  Is not capable of dispensing or managing own medication      Mobility   Mobility Status  --   ambulating with RW with min-mod cueing and CGA   Mobility Status Comments  needed cueing for safety/navigation      Vision Assessment   Visual Fields  Left homonymous Hemianopsia   per  Epic notes   Comment  Pt demo difficulty keeping eyes open and following directions limiting ability to participate in more formal visual assessment today, noted difficulty navigating RW and cueing for turning/looking L and drifted to R side, also noted decr initiation of LUE functionally      Cognition   Overall Cognitive Status  Impaired/Different from baseline   difficult to assess due to lethargy   Area of Impairment  Attention;Memory;Following commands;Problem solving;Safety/judgement    Current Attention Level  --   difficulty attending to questions/directions due to lethargy   Attention Comments  needed min-mod physical/verbal prompts to participate    Memory  Decreased short-term memory   per wife   Following Command Comments  pt demo difficulty following 1-2 step directions at times despite verbal, demo, and tactile cues, ?apraxia or language deficits also may contribute    Safety/Judgement  Decreased awareness of safety;Decreased awareness of deficits    Safety and Judgement Comments  fall from getting up alone, decr awareness    Problem Solving  --   deficits   Memory  --    Behaviors  --   lethargic, difficulty keeping eyes open, despite cues   Cognition Comments  To be assessed further in functional context; pt did better with familiar functional tasks vs. verbal/demo cueing and moving to targets, min-mod verbal/tactile cueing for attention/alertness      Sensation   Light Touch  Not tested    Additional Comments  pt denies numbness/sensory changes      Coordination   Gross Motor Movements are Fluid and Coordinated  No    Fine Motor Movements are Fluid and Coordinated  No    Box and Blocks  R-23blocks, L-17blocks    Other  needed cueing for LUE use at times during session      Perception   Perception  Impaired    Inattention/Neglect  Does not attend to left visual field   decr initation of LUE functionally noted     Praxis   Praxis  Impaired    Praxis Impairment  Details  Motor planning   vs. cognition     ROM / Strength   AROM / PROM / Strength  AROM;Strength      AROM   Overall AROM Comments  approx 90% gross shoulder movement, but pt with difficulty following  directions for formal assessment, BUEs ROM sufficient for doffing jacket and did better reaching to targets      Strength   Overall Strength  Deficits;Unable to assess   difficulty with formal testing of BUEs due to cognition     Hand Function   Right Hand Grip (lbs)  40    Left Hand Grip (lbs)  35                      OT Education - 11/04/19 1006    Education Details  OT eval results/POC; Recommended that pt schedule swallowing eval and concerns with thin liquids; Recommended wife call MD regarding medications making pt very lethargic; Recommended wife apply for [employer]     Person(s) Educated  Patient;Spouse    Methods  Explanation    Comprehension  Verbalized understanding       OT Short Term Goals - 11/04/19 1039      OT SHORT TERM GOAL #1   Title  Pt will be able to perform LUE HEP (ROM, strength, coordination) with cueing/supervision from wife.--check STGs 12/06/19    Baseline  dependent, no formal HEP    Time  4    Period  Weeks    Status  New      OT SHORT TERM GOAL #2   Title  Pt will be able to attend to functional task/cognitive task for at least 25min without cueing.    Baseline  pt needs min-mod cueing today    Time  4    Period  Weeks    Status  New      OT SHORT TERM GOAL #3   Title  Pt will perform simple visual scanning at table with at least 90% accuracy.    Baseline  needs cueing to scan to the L    Time  4    Period  Weeks    Status  New      OT SHORT TERM GOAL #4   Title  Pt/wife will be able to verbalize cognitive/visual compensation strategies for ADLs/IADLs.    Baseline  dependent    Time  4    Period  Weeks    Status  New        OT Long Term Goals - 11/04/19 1046      OT LONG TERM GOAL #1   Title  Pt  will be able to participate in HEP for cognition/vision with cueing from wife.    Baseline  dependent    Time  8    Period  Weeks    Status  New      OT LONG TERM GOAL #2   Title  Pt will perform simple environmental navigation safely without cueing.    Baseline  needs cueing    Time  8    Period  Weeks    Status  New      OT LONG TERM GOAL #3   Title  Pt will be able to attend to functional task/cognitive task for at least 63min without cueing.    Baseline  needs min-mod cueing    Time  8    Period  Weeks    Status  New      OT LONG TERM GOAL #4   Title  Pt will perform toileting mod I.    Baseline  close supervision and min A/cues    Time  8    Period  Weeks    Status  New            Plan - 11/04/19 0954    Clinical Impression Statement  Pt is a 69 y.o. male s/p CVA in March and July 2020.  Pt only received a few visits of home health due to financial concerns/no insurance per wife, but pt continues to demo difficulty with swallowing, mobility, ADLs, and cognition.  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  Pt with PMH that includes: prior CVA 2017, 11/2018, 03/2019, DM, dyslipidemia, HTN, urinary incontinence.  Pt presents today with decr strength/activity tolerance, decr coordination, decr balance/functional mobility, visual-perceptual deficits, cognitive deficits.  Pt would benefit from occupational therapy to address these deficits for incr safety, ease with ADLs for improved quality of life and to decr caregiver burden.    OT Occupational Profile and History  Detailed Assessment- Review of Records and additional review of physical, cognitive, psychosocial history related to current functional performance    Occupational performance deficits (Please refer to evaluation for details):  ADL's;IADL's;Work;Leisure;Social Participation    Body Structure / Function / Physical Skills   ADL;Dexterity;ROM;Vision;IADL;Balance;Mobility;Strength;FMC;Coordination;Decreased knowledge of precautions;UE functional use;GMC;Decreased knowledge of use of DME;Endurance    Cognitive Skills  Attention;Problem Solve;Safety Awareness;Thought;Understand;Memory;Perception    Rehab Potential  Fair    Clinical Decision Making  Several treatment options, min-mod task modification necessary    Comorbidities Affecting Occupational Performance:  May have comorbidities impacting occupational performance    Modification or Assistance to Complete Evaluation   Min-Moderate modification of tasks or assist with assess necessary to complete eval    OT Frequency  1x / week    OT Duration  8 weeks   +eval   OT Treatment/Interventions  Self-care/ADL training;Moist Heat;DME and/or AE instruction;Therapeutic activities;Therapeutic exercise;Cognitive remediation/compensation;Visual/perceptual remediation/compensation;Passive range of motion;Functional Mobility Training;Neuromuscular education;Cryotherapy;Energy conservation;Manual Therapy;Patient/family education;Balance training;Aquatic Therapy    Plan  initiate HEP for UE strength, ROM then further assess vision/cognition as time allows    Consulted and Agree with Plan of Care  Patient;Family member/caregiver    Family Member Consulted  wife       Patient will benefit from skilled therapeutic intervention in order to improve the following deficits and impairments:   Body Structure / Function / Physical Skills: ADL, Dexterity, ROM, Vision, IADL, Balance, Mobility, Strength, FMC, Coordination, Decreased knowledge of precautions, UE functional use, GMC, Decreased knowledge of use of DME, Endurance Cognitive Skills: Attention, Problem Solve, Safety Awareness, Thought, Understand, Memory, Perception     Visit Diagnosis: Muscle weakness (generalized)  Other lack of coordination  Unsteadiness on feet  Other abnormalities of gait and mobility  Visuospatial  deficit  Unspecified symptoms and signs involving cognitive functions following cerebral infarction  Attention and concentration deficit  Frontal lobe and executive function deficit    Problem List Patient Active Problem List   Diagnosis Date Noted  . Benign essential HTN   . Cognitive deficit, post-stroke   . Dysphagia, post-stroke   . Controlled type 2 diabetes mellitus with hyperglycemia, without long-term current use of insulin ([place])   . 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 ([place]) 06/03/2019  . 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 Dyslipidemia, goal LDL below 70 03/12/2019  . Dysphagia due to recent stroke 03/12/2019  . Overweight 03/12/2019  . 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 Occlusion of right posterior communicating artery  03/10/2019  . Abnormality of gait   . Spasticity   . Gait disturbance, post-stroke   . Cerebrovascular accident (CVA) due to occlusion of vertebral artery ([place])   . Essential hypertension 01/31/2016  . 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    [facility] 11/04/2019, 10:54 AM  [facility] [address] [place], [place], [address] Phone: [phone]   Fax:  [phone]  Name: Joe BLOUNT Sr. MRN: [id number] Date of Birth: 1951/01/19   Vianne Bulls, OTR/L [facility] [address] [place], [place]  [address] [phone] phone [phone] 11/04/19 10:54 AM

## 2019-11-05 ENCOUNTER — Telehealth: Payer: Self-pay

## 2019-11-05 ENCOUNTER — Ambulatory Visit: Payer: 59 | Admitting: Speech Pathology

## 2019-11-05 ENCOUNTER — Encounter: Payer: Self-pay | Admitting: Speech Pathology

## 2019-11-05 DIAGNOSIS — R471 Dysarthria and anarthria: Secondary | ICD-10-CM

## 2019-11-05 DIAGNOSIS — R41841 Cognitive communication deficit: Secondary | ICD-10-CM

## 2019-11-05 DIAGNOSIS — R2689 Other abnormalities of gait and mobility: Secondary | ICD-10-CM | POA: Diagnosis not present

## 2019-11-05 DIAGNOSIS — R1312 Dysphagia, oropharyngeal phase: Secondary | ICD-10-CM

## 2019-11-05 NOTE — Telephone Encounter (Signed)
Call placed to patient's wife # [phone] regarding referral for home health and PCS.  Message left with call back requested to this CM.    Patient does not have medicaid as per Epic, will need to clarify with patient's wife. He is not eligible for PCS without medicaid.   Regarding home health referral, he is already attending outpatient therapy and is not eligible for both home and outpatient services.

## 2019-11-05 NOTE — Therapy (Signed)
[facility] [address] [place], [place], [address] Phone: [phone]   Fax:  [phone]  Speech Language Pathology Evaluation  Patient Details  Name: Joe PASTOR Sr. MRN: [id number] Date of Birth: [birth year] Referring Provider (SLP): Cammie Fulp   Encounter Date: 11/05/2019  End of Session - 11/05/19 1134    Visit Number  1    Number of Visits  17    Date for SLP Re-Evaluation  12/31/19    Authorization Type  self pay at this time    SLP Start Time  1018    SLP Stop Time   1103    SLP Time Calculation (min)  45 min    Activity Tolerance  Patient limited by lethargy       Past Medical History:  Diagnosis Date  . Diabetes mellitus without complication ([place])   . Hypertension   . Stroke [facility])     Past Surgical History:  Procedure Laterality Date  . IR ANGIO INTRA EXTRACRAN SEL COM CAROTID INNOMINATE UNI L MOD SED  03/10/2019  . IR ANGIO VERTEBRAL SEL SUBCLAVIAN INNOMINATE BILAT MOD SED  03/10/2019  . IR CT HEAD LTD  03/10/2019  . IR PERCUTANEOUS ART THROMBECTOMY/INFUSION INTRACRANIAL INC DIAG ANGIO  03/10/2019  . RADIOLOGY WITH ANESTHESIA N/A 03/10/2019   Procedure: IR WITH ANESTHESIA/CODE STROKE;  Surgeon: Radiologist, Medication, MD;  Location: [facility];  Service: Radiology;  Laterality: N/A;    There were no vitals filed for this visit.  Subjective Assessment - 11/05/19 1114    Subjective  "He will swallow juice because it's sweet"    Patient is accompained by:  Family member   spouse   Currently in Pain?  No/denies         SLP Evaluation [facility] - 11/05/19 1114      SLP Visit Information   SLP Received On  11/05/19    Referring Provider (SLP)  Cammie Fulp    Onset Date  July 2020    Medical Diagnosis  CVA      Subjective   Patient/Family Stated Goal  To get him to drink water      General Information   HPI   Pt is a 69 y.o. male s/p CVA in March and July 2020.  Pt only received a few visits of home  health due to financial concerns/no insurance per wife, but pt continues to demo difficulty with swallowing, mobility, ADLs, and cognition.  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  Pt with PMH that includes: prior CVA 2017, 11/2018, 03/2019, DM, dyslipidemia, HTN, urinary incontinence. MBSS 03/13/19 revealed moderate oropharyngeal dyphagia with oral holding, delayed oral A-P transit, delayed swallow onset. Recl Dys 2 and thin liquids    Mobility Status  walks with water, PT eval yesterday      Prior Functional Status   Cognitive/Linguistic Baseline  Within functional limits    Type of Home  House     Lives With  Spouse    Vocation  Retired   sub professor at [employer]   Overall Cognitive Status  Impaired/Different from baseline    Area of Impairment  Attention;Memory;Following commands;Problem solving;Safety/judgement    Current Attention Level  Focused    Memory  Decreased short-term memory    Following Commands  Follows one step commands inconsistently    Safety/Judgement  Decreased awareness of safety;Decreased awareness of deficits    Attention  Sustained    Sustained Attention  Impaired    Sustained Attention Impairment  Verbal basic;Functional basic      Auditory Comprehension   Overall Auditory Comprehension  Impaired    Yes/No Questions  Impaired    Basic Biographical Questions  51-75% accurate    Conversation  Simple    Interfering Components  Attention;Processing speed;Working memory   drowsy   EffectiveTechniques  Visual/Gestural cues;Stressing words;Slowed speech;Increased volume;Pausing;Extra processing time      Verbal Expression   Overall Verbal Expression  Impaired    Initiation  Impaired    Level of Generative/Spontaneous Verbalization  Word    Naming  Not tested    Pragmatics  Impairment    Impairments  Dysprosody;Monotone;Turn Taking;Eye contact    Interfering Components   Attention      Oral Motor/Sensory Function   Overall Oral Motor/Sensory Function  Impaired    Labial ROM  Within Functional Limits    Labial Symmetry  Abnormal symmetry right    Labial Coordination  Reduced    Lingual ROM  Reduced right    Lingual Symmetry  Within Functional Limits    Lingual Coordination  Reduced    Velum  Within Functional Limits    Mandible  Within Functional Limits      Motor Speech   Overall Motor Speech  Impaired    Respiration  Impaired    Level of Impairment  Phrase    Phonation  Low vocal intensity    Resonance  Within functional limits    Articulation  Within functional limitis    Intelligibility  Intelligibility reduced    Word  50-74% accurate    Phrase  25-49% accurate    Motor Planning  Impaired   difficulty to assess due to attention    Motor Speech Errors  Unaware                      SLP Education - 11/05/19 1133    Education Details  Phsyical swallow impairment vs cognitive-behavioral swallow impairment; HEP for dysarthria    Person(s) Educated  Patient;Spouse    Methods  Explanation;Demonstration;Verbal cues;Handout    Comprehension  Verbal cues required;Need further instruction       SLP Short Term Goals - 11/05/19 1155      SLP SHORT TERM GOAL #1   Title  Pt will complete HEP for dysarthria with occasional min A from ST or spouse over 2 sessions    Time  4    Period  Weeks    Status  New      SLP SHORT TERM GOAL #2   Title  Pt will sustain attention to tasks in ST for 3 minutes with occasional min A    Time  4    Period  Weeks    Status  New      SLP SHORT TERM GOAL #3   Title  Pt will swallow thin water with external compensations within 8 seconds with usual mod A    Time  4    Period  Weeks    Status  New       SLP Long Term Goals - 11/05/19 1158      SLP LONG TERM GOAL #1   Title  Pt will attend to and participate in 3 turns of conversation with occasional min A over 2 sessions    Time  8    Period   Weeks    Status  New  SLP LONG TERM GOAL #2   Title  Pt will be 85% intellgible at phrase and word level in quiet room with occasional min A    Time  8    Period  Weeks    Status  New      SLP LONG TERM GOAL #3   Title  Pt will swallow liqiuds withing 5 seconds with occasional min A over 2 sessions    Time  8    Period  Weeks    Status  New       Plan - 11/05/19 1200    Clinical Impression Statement  Mr. Cypret is 69 y.o.. male, retired professor of [employer]. He is accompanied by his spouse. He is referred for diffiiculty swallowing and cognitive linguistic impairment. Mrs.Vanwagoner reports difficulty swallowing water, as pt holds water in his mouth and does not swallow it. She reports he swallows juice and soup, and will swallow things that are sweet. She denies coughing or choking with PO. She also reports pt will hold his meds in his hand or in his mouth, despite her verbal cues to take them.  BSE: Moderate oropharyngeal dysphagia. PO trials of soft solid (cereal bar), thin water and diet soda revealed slow oral phase (c/w MBSS) After 2 spontaneous swallows of soft solid, mild residue noted in right buccal cavity consistently. I encouraged oral care after meals.Although oral phase delayed, and swallow inititation delayed, Mr. Pickell did swallow soft solid and diet soda with occasionall min verbal cues and no overt s/s of aspiration. He held water and required usual mod verbal cues and cues to open his mouth and say /a/ to swallow water. Trials of successive straw sips were delayed, however 2 swallows initiated within 6 seconds with frequent verbal cues to sip and swallow.  No overt s/s of aspiration with water. He was observed 3x to cough on his saliva prior to initiating PO trials, as he is not swallowing at rest. Due to inconsisten swallow response with water and sweet tasting boluses, there is a cognitive-behavioral component to the dysphagia. At this time, repeat MBSS is not  warranted. Will order if indicated during course of tx.  Cog/linguistic:  Mr. Bihl presetns with moderate to severe cognitive liguistic impairments and dysarthria. He required min A for focused attention and mod to max A for sustained attention. He is drowsy and drifted off to sleep several times. Mrs. Drennon is checking on medication times for meds that cause drowsiness. She reports he sleeps most of the day at home. He required repetition and modeling to follow 1 step body commands for oral mech eval. Usual mod A and repetition to answer personal questions at word level. Volume significanly decreased and barely audible, pt is 70% intelligible at word level with known context in quiet room. Loud /a/ and loud counting increased volume to audible, but quiet, level. He required max A to repeat his sons' names. Speech is without groping or halting. Difficult to determine audtory comprehension vs processing speed vs inititation. Pt required max A for awareness of volume and poor intelligilbity. Mrs. Sachar reports she does not hear his speech at home as well. I recommend skilled ST to maximize cognition, intellgibility and swallowing to reduce caregiver burden and improve pt safety.    Speech Therapy Frequency  2x / week    Duration  --   8 weeks or 17 visits (pt self pay - may modify as needed)   Treatment/Interventions  Aspiration precaution training;Language facilitation;Environmental controls;Cueing  hierarchy;Oral motor exercises;SLP instruction and feedback;Compensatory strategies;Functional tasks;Cognitive reorganization;Compensatory techniques;Diet toleration management by SLP;Trials of upgraded texture/liquids;Internal/external aids;Multimodal communcation approach;Patient/family education; MBSS as indicated   Potential to Achieve Goals  Fair    Potential Considerations  Cooperation/participation level;Severity of impairments    SLP Home Exercise Plan  Loud /a/, count to 20, repeat family names -  focus on  volume    Consulted and Agree with Plan of Care  Patient;Family member/caregiver    Family Member Consulted  spouse       Patient will benefit from skilled therapeutic intervention in order to improve the following deficits and impairments:   Cognitive communication deficit  Dysarthria and anarthria  Dysphagia, oropharyngeal phase    Problem List Patient Active Problem List   Diagnosis Date Noted  . Benign essential HTN   . Cognitive deficit, post-stroke   . Dysphagia, post-stroke   . Controlled type 2 diabetes mellitus with hyperglycemia, without long-term current use of insulin ([place])   . 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 Dyslipidemia, goal LDL below 70 03/12/2019  . Dysphagia due to recent stroke 03/12/2019  . Overweight 03/12/2019  . 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 Abnormality of gait   . Spasticity   . Gait disturbance, post-stroke   . Cerebrovascular accident (CVA) due to occlusion of vertebral artery ([place])   . Essential hypertension 01/31/2016  . 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    Tavyn Kurka, Annye Rusk MS, CCC-SLP 11/05/2019, 12:04 PM  [facility] [address] [place], [place], [address] Phone: [phone]   Fax:  [phone]  Name: Joe MINCHEW Sr. MRN: [id number] Date of Birth: [birth year]

## 2019-11-05 NOTE — Patient Instructions (Addendum)
   Say AH! As loud as you can 5x twice a day  Count to 20, repeat children's names 3x  Aggie Cosier [facility]

## 2019-11-06 ENCOUNTER — Telehealth: Payer: Self-pay

## 2019-11-06 ENCOUNTER — Other Ambulatory Visit: Payer: Self-pay | Admitting: Family Medicine

## 2019-11-06 DIAGNOSIS — I69391 Dysphagia following cerebral infarction: Secondary | ICD-10-CM

## 2019-11-06 NOTE — Progress Notes (Signed)
Patient ID: Joe BERDECIA Sr., male   DOB: 10-May-1951, 69 y.o.   MRN: [id number]   Swallowing evaluation ordered as patient with post stroke dysphagia and there is concern that he may be having issues with aspiration

## 2019-11-06 NOTE — Telephone Encounter (Signed)
Call placed to patient's home, spoke to his wife and explained that Dr. Chapman Fitch has placed a referral for home health therapy. This CM explained that he can't attend outpatient therapy and receive home therapy at the same time.  She said that they prefer the equipment that is available at outpatient but he does not have Medicare part B coverage so she was agreeable to placing the home health referral. They did not have any preference for home health agencies. She understood that the home therapy is covered by medicare part A.  He will not receive part B until 03/2020.    Regarding [facility] application, informed her that without medicaid he is not eligible for this service.  She said that they are still waiting for a decision from about his application from [place].  Provided her with the phone number for CAP services.  If eligible, more services would be provided than through [facility]. Explained that when/if  medicaid is obtained, a PCS referral can be placed.   Home health referral faxed to [facility]

## 2019-11-06 NOTE — Telephone Encounter (Signed)
Call received from [facility], [place] noting that they can provide the PT and OT for the patient but will not be able to provide ST until 12/2019.   She requested referral be updated without ST at this time.   Updated referral faxed to [facility]

## 2019-11-10 NOTE — Telephone Encounter (Signed)
ok 

## 2019-11-10 NOTE — Telephone Encounter (Signed)
Message received from [facility] noting that the therapist went out to see the patient 11/07/2019 and his wife showed them a document with schedule for outpatient therapy. They explained that the patient is not able to attend outpatient therapy and also receive home health PT. The patient chose to continue with outpatient therapy.   Call placed to patient's wife. Explained coverage for home and outpatient therapy as patient does not have medicare part B for outpatient.. She said that she liked the equipment that they have at outpatient therapy and she was agreeable to discussing her concerns about the cost of outpatient therapy when she goes with her husband to his therapy appointment at the hospital tomorrow.  She did not agree to home health at this time  She said she would call this CM back with more information after tomorrow.

## 2019-11-11 ENCOUNTER — Other Ambulatory Visit: Payer: Self-pay

## 2019-11-11 ENCOUNTER — Ambulatory Visit: Payer: 59

## 2019-11-11 IMAGING — CT CT HEAD WITHOUT CONTRAST
3 series · 15 of 47 positions shown, 18 images · non-contrast
Comparison: MRI head 03/11/2019, CT 03/10/2019

CLINICAL DATA: Stroke follow-up

EXAM:
CT HEAD WITHOUT CONTRAST
TECHNIQUE: Contiguous axial images were obtained from the base of the skull
through the vertex without intravenous contrast.

[Series 3: head 5.0 h30s · axial · 0.45mm/px · z∈[-98,+47]mm · 9 of 35 slices shown, 12 images]
[im 3/35  brain]
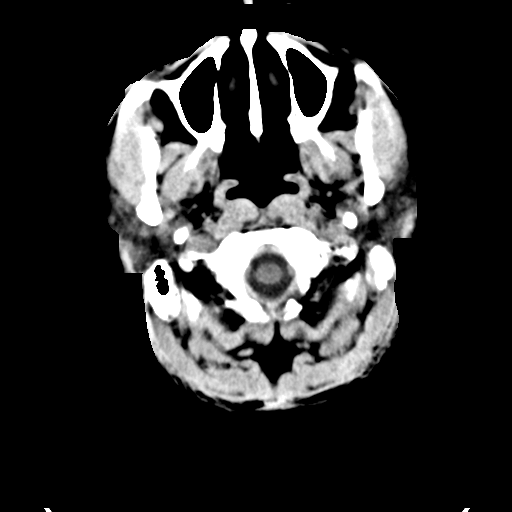
[im 3/35  bone]
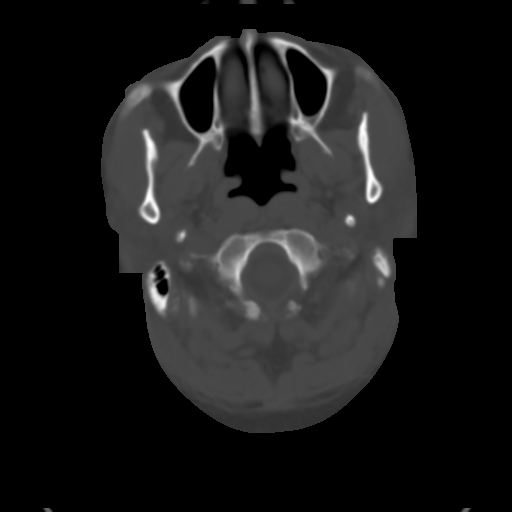
[im 6/35  brain]
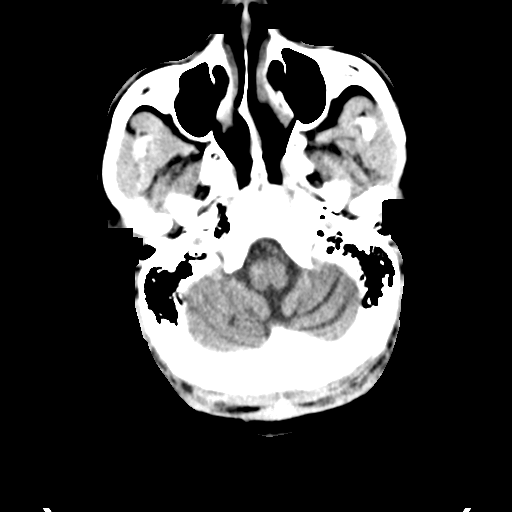
[im 10/35  brain]
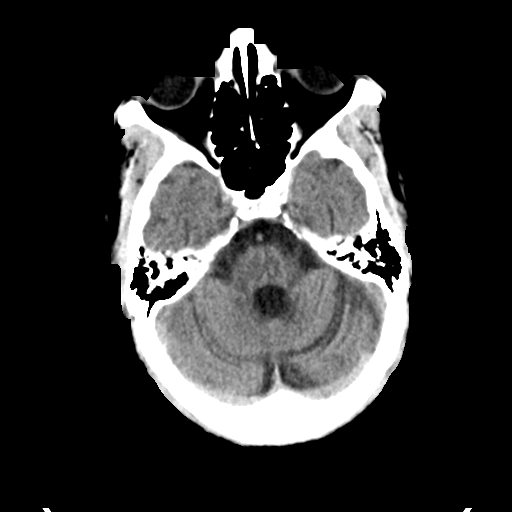
[im 13/35  brain]
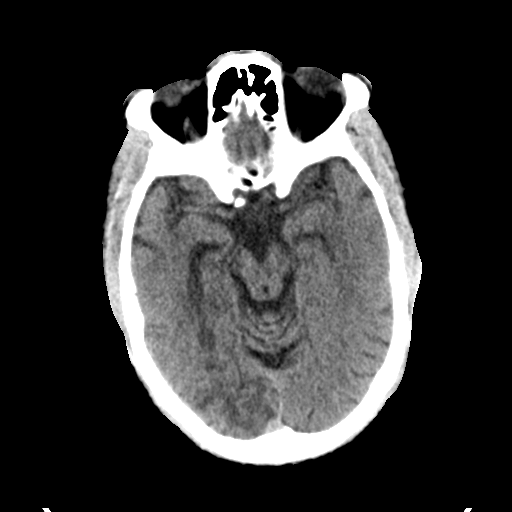
[im 18/35  brain]
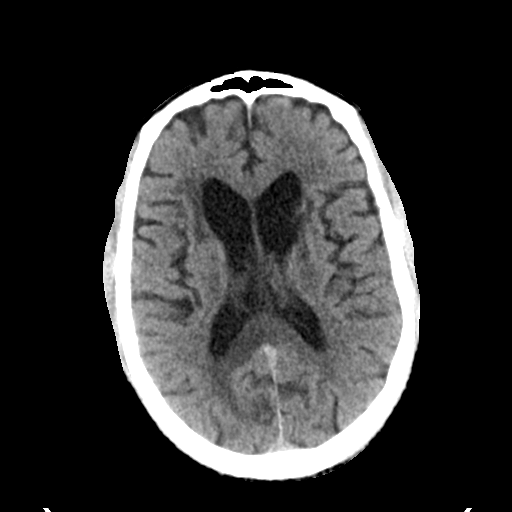
[im 18/35  bone]
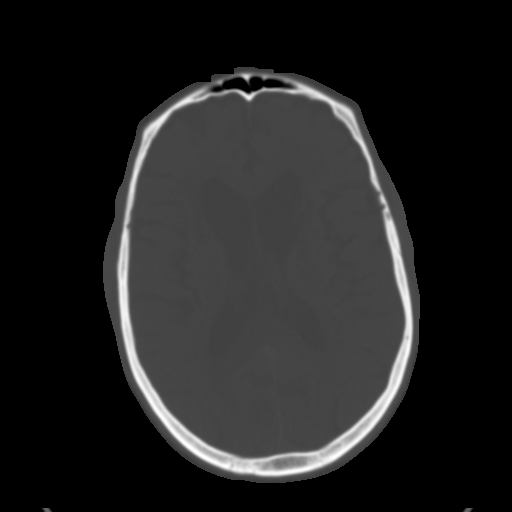
[im 22/35  brain]
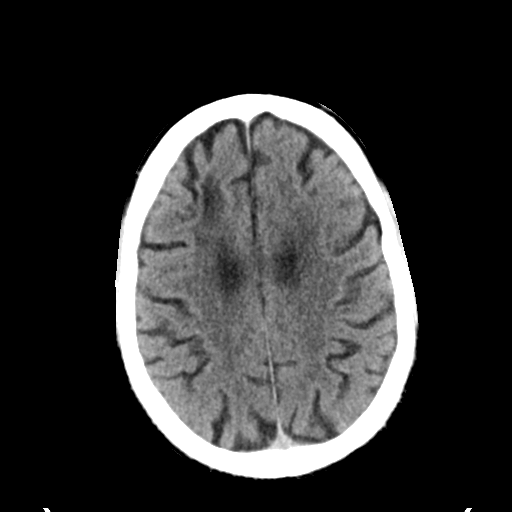
[im 25/35  brain]
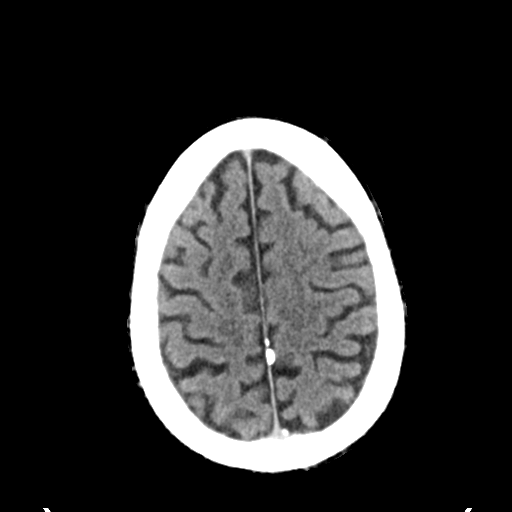
[im 29/35  brain]
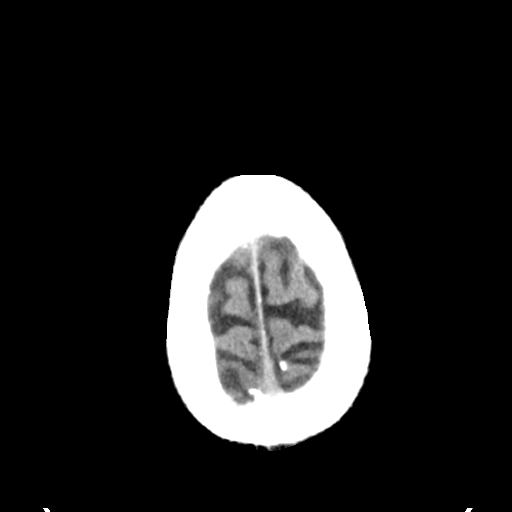
[im 32/35  brain]
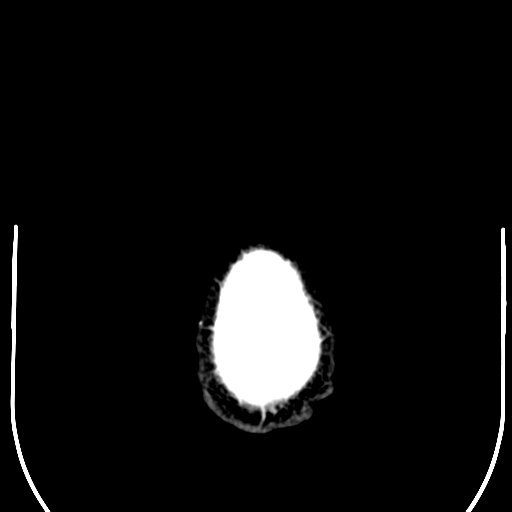
[im 32/35  bone]
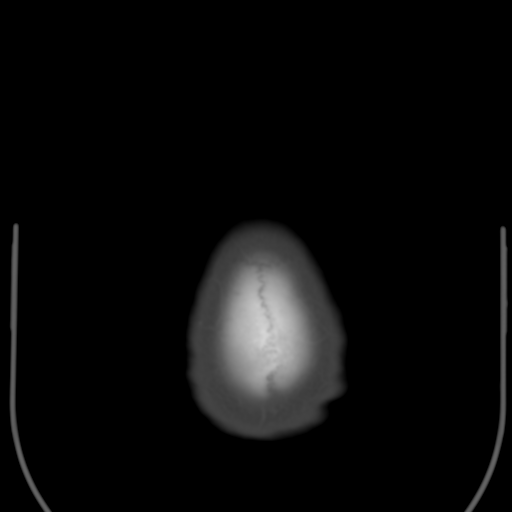

[Series 5: head 3.0 mpr cor · coronal · 0.34mm/px · 3 of 67 slices shown]
[im 23/67  brain]
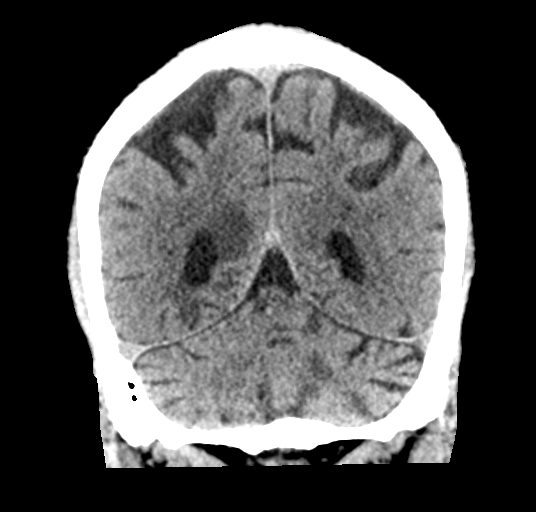
[im 30/67  brain]
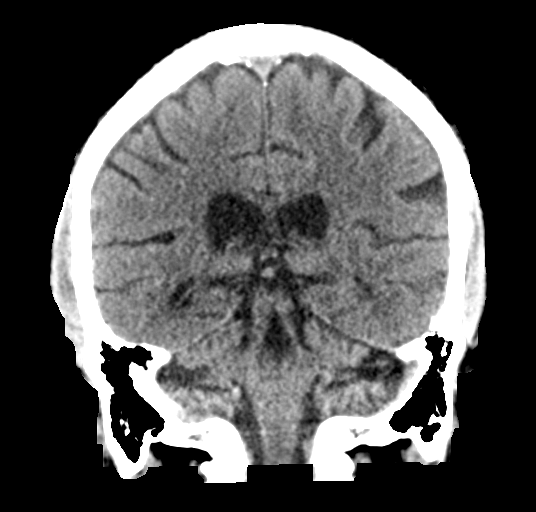
[im 37/67  brain]
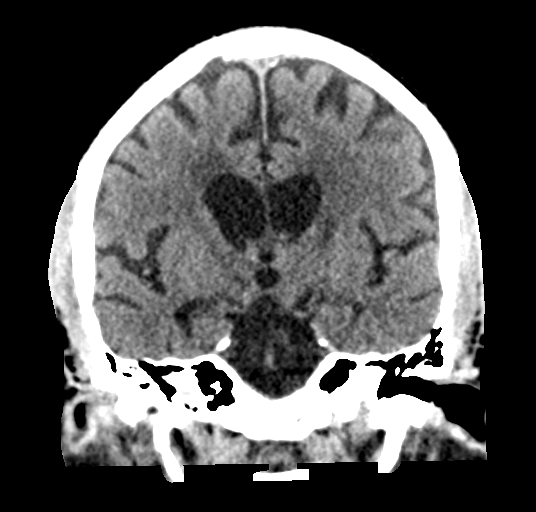

[Series 6: head 3.0 mpr sag · sagittal · 0.31mm/px · 3 of 61 slices shown]
[im 21/61  brain]
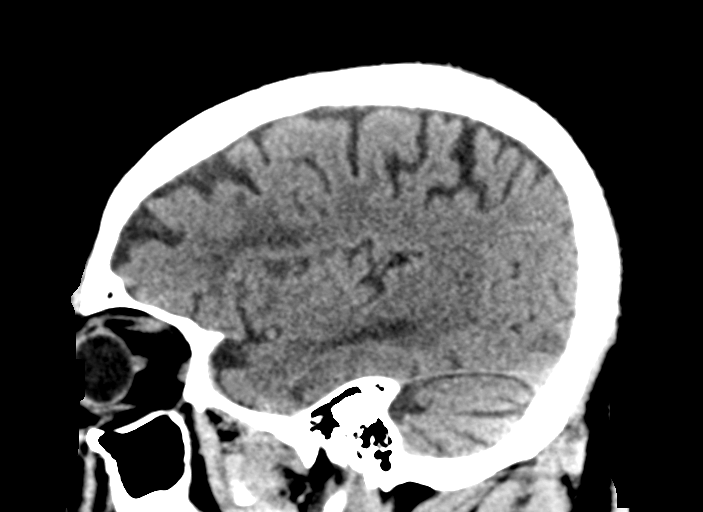
[im 31/61  brain]
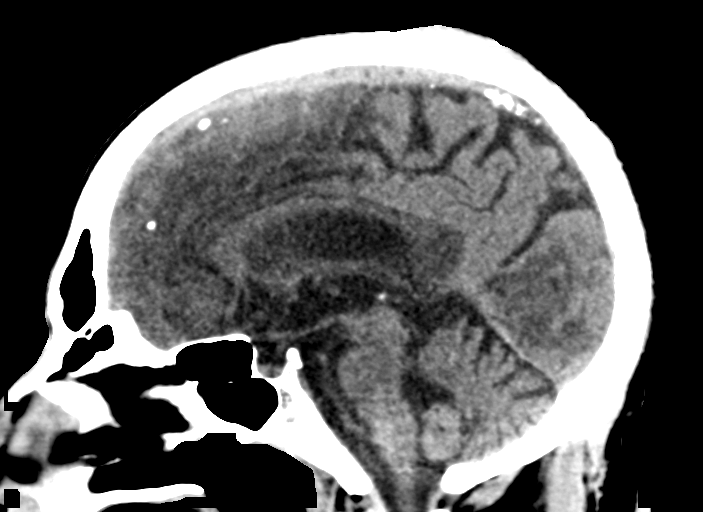
[im 41/61  brain]
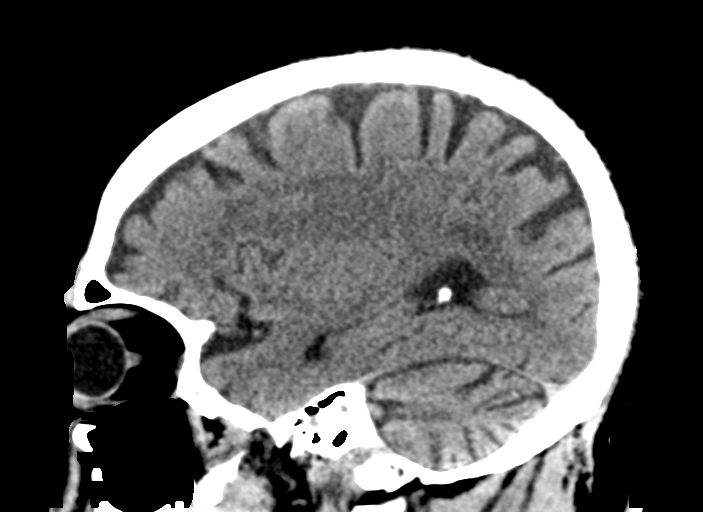

[15 of 47 positions shown; findings below may reference images not displayed]

FINDINGS: Brain: Evolving right PCA infarct with progressive low-density.
Similar territory to the areas of restricted diffusion on prior MRI
involving the right thalamus as well as the medial temporal lobe
extending into the right occipital lobe.

Extensive chronic ischemic changes in the white matter and basal
ganglia bilaterally. Chronic infarct right frontal lobe. Generalized
atrophy

No acute hemorrhage or mass.

Vascular: Negative for hyperdense vessel

Skull: Negative

Sinuses/Orbits: Mild mucosal edema paranasal sinuses. Negative orbit

Other: None
IMPRESSION: Evolving infarct in the right PCA territory. No progression since
prior studies. No acute hemorrhage

Atrophy and extensive chronic  vessel ischemia.

## 2019-11-11 NOTE — Therapy (Signed)
[facility] [address] [place], [place], [address] Phone: [phone]   Fax:  [phone]  Patient Details  Name: KALIEB RICKETSON Sr. MRN: [id number] Date of Birth: 1951-04-12 Referring Provider:  Antony Blackbird, MD  Encounter Date: 11/11/2019  ST Arrive-Cancel   Wife prefers home health therapies, after discussion with SLP today. See below for details.   Wife presented [employer] card (effective date 11-03-19) to receptionist, after wife's inquiry as to what kind of insurance will cover outpatient therapies.  Pt was very unsteady with rolling walker, demonstrating random leaning backward, and forward, during various times during the ~75-foot walk back to ST room. Pt req'd max A for directions to ST room, given repeated auditory and pointing cues, including manual cues by SLP for pt to turn his rolling walker. Pt, SLP, and wife walked back to therapy room in close to 90 seconds.  After SLP questioning cues of pt's wife, SLP ascertained wife was asking about differences in what home health and outpatient therapies could offer pt. After 8-10 minutes discussion using written cues for wife, wife told SLP she thought pt would benefit most from home health therapies at this time. Wife indicates knowledge that (according to chart) ST would not likely begin until April 2021. In light of this, SLP provided some basic attention tasks wife could accomplish daily with pt (with hand over hand assist if necessary) for increasing pt's focused and sustained attention. Pt was somnolent during 90% of this discussion, with wife occasionally touching pt's arm to wake him. In this SLP 's opinion, given pt's significant deficits in attention and awareness, and frequent persistent somnolence, SLP questions if home health ST would better benefit pt at this time as therapy could be done in the familiar environment of his home. When pt's attention  and awareness (and wakefulness) improve pt would likely have benefit from skilled outpatient ST. SLP shared this opinion with pt's wife.  About 20 minutes after SLP, pt, and wife sat down in [place] room, pt awoke and began to repeatedly arch his back and lean back in chair. Pt was unresponsive x4 to auditory questioning by wife in [place] and in his first language, if he needed to use the restroom. Wife and SLP ultimately assisted pt to stand and she and SLP accompanied pt to restroom where wife entered with pt.    [facility] ,[place], [place]  11/11/2019, 8:44 AM  [facility] [address] [place], [place], [address] Phone: [phone]   Fax:  [phone]

## 2019-11-12 ENCOUNTER — Ambulatory Visit: Payer: 59 | Admitting: Occupational Therapy

## 2019-11-12 ENCOUNTER — Telehealth: Payer: Self-pay | Admitting: Family Medicine

## 2019-11-12 ENCOUNTER — Telehealth: Payer: Self-pay

## 2019-11-12 NOTE — Telephone Encounter (Signed)
Please give verbal consent for home health/PT orders

## 2019-11-12 NOTE — Telephone Encounter (Signed)
Left verbal orders on voicemail of Joe Perry.  Encouraged to call back for further questions.

## 2019-11-12 NOTE — Telephone Encounter (Signed)
Opened in error

## 2019-11-12 NOTE — Telephone Encounter (Signed)
Tharon Aquas called from [facility] asking for Pt 1 week 1 2 week 3 1 week 5 and a medical social work please call her back at [phone] you can lvm

## 2019-11-12 NOTE — Telephone Encounter (Signed)
Call placed to [facility] and confirmed that they will be seeing the patient now that he has stopped outpatient therapies.  They will be calling the patient to schedule home visit.

## 2019-11-13 ENCOUNTER — Other Ambulatory Visit (HOSPITAL_COMMUNITY): Payer: Self-pay

## 2019-11-13 ENCOUNTER — Telehealth: Payer: Self-pay | Admitting: Family Medicine

## 2019-11-13 DIAGNOSIS — R059 Cough, unspecified: Secondary | ICD-10-CM

## 2019-11-13 DIAGNOSIS — R05 Cough: Secondary | ICD-10-CM

## 2019-11-13 DIAGNOSIS — R1310 Dysphagia, unspecified: Secondary | ICD-10-CM

## 2019-11-13 NOTE — Telephone Encounter (Signed)
Call returned to [facility], [facility] and orders approved for OT as requested

## 2019-11-13 NOTE — Telephone Encounter (Signed)
Joe Perry from [facility] called and requested for OT orders. Joe Perry requested 3x's a week for 2 weeks and 1x a week for 1 week. Please follow up at your earliest convenience.

## 2019-11-13 NOTE — Telephone Encounter (Signed)
Can give verbal order for requested services

## 2019-11-14 ENCOUNTER — Telehealth: Payer: Self-pay | Admitting: Licensed Clinical Social Worker

## 2019-11-14 NOTE — Telephone Encounter (Signed)
Incoming call received from Meriam Sprague with [facility]. Jenny Reichmann shared that pt and wife needs assistance in applying for medicaid to cover medical expenses from December 2020-February 2021. They are also interested in update regarding [facility] application.   Pt scheduled appointment with LCSW for Monday, March 15, 21. No additional concerns noted.

## 2019-11-17 ENCOUNTER — Other Ambulatory Visit: Payer: Self-pay

## 2019-11-17 ENCOUNTER — Ambulatory Visit: Payer: 59 | Attending: Family Medicine | Admitting: Licensed Clinical Social Worker

## 2019-11-17 ENCOUNTER — Ambulatory Visit: Payer: 59 | Admitting: Physician Assistant

## 2019-11-17 DIAGNOSIS — R05 Cough: Secondary | ICD-10-CM

## 2019-11-17 DIAGNOSIS — R059 Cough, unspecified: Secondary | ICD-10-CM

## 2019-11-17 DIAGNOSIS — Z598 Other problems related to housing and economic circumstances: Secondary | ICD-10-CM

## 2019-11-17 DIAGNOSIS — Z599 Problem related to housing and economic circumstances, unspecified: Secondary | ICD-10-CM

## 2019-11-17 NOTE — Progress Notes (Signed)
Patient verified DOB Patient has eaten today. Patient has taken medication today. Patient has intermittent dry coughing and the past 3 days cough has increased. Patient does not drink water and prefers juice.  Patient received Mucinex on Thursday and some relief was noted from the symptoms.

## 2019-11-17 NOTE — Progress Notes (Signed)
Virtual Visit via Telephone Note  I connected with Joe FLORER Sr. on 11/17/19 at  3:30 PM EDT by telephone and verified that I am speaking with the correct person using two identifiers.  Location:   I discussed the limitations, risks, security and privacy concerns of performing an evaluation and management service by telephone and the availability of in person appointments. I also discussed with the patient that there may be a patient responsible charge related to this service. The patient expressed understanding and agreed to proceed.   History of Present Illness: Wife is present and acts as interpreter, patient has difficulty articulating due to stroke complications.  Reports that he has been having a productive cough for the past 3 days.  States that he has been taking Mucinex with relief, does not want to spit out exudate, does not want to drink water.  Wife reports that he is eating and drinking okay, oxygen level this morning was 97.  Denies fever, denies shortness of breath.  Reports that he has his second Covid vaccination shot scheduled for tomorrow November 18, 2019.  Observations/Objective: Patient history reviewed, no physical exam  Assessment and Plan:  1. Cough Continue supportive care with Mucinex, trial of Zyrtec and Vicks VapoRub over-the-counter.  Continue checking pulse ox.  Continue fluids.  Red flags given for prompt evaluation at urgent care.  Gave wife instructions on how to sign up for my chart, sent email activation code.  Follow Up Instructions:    I discussed the assessment and treatment plan with the patient. The patient was provided an opportunity to ask questions and all were answered. The patient agreed with the plan and demonstrated an understanding of the instructions.   The patient was advised to call back or seek an in-person evaluation if the symptoms worsen or if the condition fails to improve as anticipated.  I provided 22 minutes of non-face-to-face  time during this encounter.   Loraine Grip Mayers, PA-C

## 2019-11-17 NOTE — Patient Instructions (Signed)
Please start Zyrtec OTC and Vicks Vaporub.  Continue Mucinex as directed and   monitor symptoms and oxygen rate.  Please follow up with [place] if your cough does not improve or worsens.     Cough, Adult A cough helps to clear your throat and lungs. A cough may be a sign of an illness or another medical condition. An acute cough may only last 2-3 weeks, while a chronic cough may last 8 or more weeks. Many things can cause a cough. They include:  Germs (viruses or bacteria) that attack the airway.  Breathing in things that bother (irritate) your lungs.  Allergies.  Asthma.  Mucus that runs down the back of your throat (postnasal drip).  Smoking.  Acid backing up from the stomach into the tube that moves food from the mouth to the stomach (gastroesophageal reflux).  Some medicines.  Lung problems.  Other medical conditions, such as heart failure or a blood clot in the lung (pulmonary embolism). Follow these instructions at home: Medicines  Take over-the-counter and prescription medicines only as told by your doctor.  Talk with your doctor before you take medicines that stop a cough (coughsuppressants). Lifestyle   Do not smoke, and try not to be around smoke. Do not use any products that contain nicotine or tobacco, such as cigarettes, e-cigarettes, and chewing tobacco. If you need help quitting, ask your doctor.  Drink enough fluid to keep your pee (urine) pale yellow.  Avoid caffeine.  Do not drink alcohol if your doctor tells you not to drink. General instructions   Watch for any changes in your cough. Tell your doctor about them.  Always cover your mouth when you cough.  Stay away from things that make you cough, such as perfume, candles, campfire smoke, or cleaning products.  If the air is dry, use a cool mist vaporizer or humidifier in your home.  If your cough is worse at night, try using extra pillows to raise your head up higher while you sleep.  Rest as  needed.  Keep all follow-up visits as told by your doctor. This is important. Contact a doctor if:  You have new symptoms.  You cough up pus.  Your cough does not get better after 2-3 weeks, or your cough gets worse.  Cough medicine does not help your cough and you are not sleeping well.  You have pain that gets worse or pain that is not helped with medicine.  You have a fever.  You are losing weight and you do not know why.  You have night sweats. Get help right away if:  You cough up blood.  You have trouble breathing.  Your heartbeat is very fast. These symptoms may be an emergency. Do not wait to see if the symptoms will go away. Get medical help right away. Call your local emergency services (911 in the [place] Do not drive yourself to the hospital. Summary  A cough helps to clear your throat and lungs. Many things can cause a cough.  Take over-the-counter and prescription medicines only as told by your doctor.  Always cover your mouth when you cough.  Contact a doctor if you have new symptoms or you have a cough that does not get better or gets worse. This information is not intended to replace advice given to you by your health care provider. Make sure you discuss any questions you have with your health care provider. Document Revised: 09/09/2018 Document Reviewed: 09/09/2018 Elsevier Patient Education  2020 Elsevier  Inc.  

## 2019-11-18 ENCOUNTER — Ambulatory Visit: Payer: 59 | Attending: Internal Medicine

## 2019-11-18 DIAGNOSIS — Z23 Encounter for immunization: Secondary | ICD-10-CM

## 2019-11-18 NOTE — Progress Notes (Signed)
   Covid-19 Vaccination Clinic  Name:  Joe COULTHARD Sr.    MRN: [id number] DOB: [birth year]  11/18/2019  Joe Perry was observed post Covid-19 immunization for 15 minutes without incident. He was provided with Vaccine Information Sheet and instruction to access the V-Safe system.   Joe Perry was instructed to call 911 with any severe reactions post vaccine: Marland Kitchen Difficulty breathing  . Swelling of face and throat  . A fast heartbeat  . A bad rash all over body  . Dizziness and weakness   Immunizations Administered    Name Date Dose VIS Date Route   Pfizer COVID-19 Vaccine 11/18/2019 10:50 AM 0.3 mL 08/15/2019 Intramuscular   Manufacturer: [place]   Lot: [id number]   [place]: [id number]

## 2019-11-19 ENCOUNTER — Telehealth: Payer: Self-pay

## 2019-11-19 ENCOUNTER — Encounter: Payer: Self-pay | Admitting: Speech Pathology

## 2019-11-19 ENCOUNTER — Encounter: Payer: Self-pay | Admitting: Occupational Therapy

## 2019-11-19 NOTE — Telephone Encounter (Signed)
Sharyn Lull from [facility] called and is wanting verbals orders for skill nursing visit for one time a week for 6 weeks. Please f/u

## 2019-11-19 NOTE — Therapy (Signed)
[facility] [address] [place], [place], [address] Phone: [phone]   Fax:  [phone]  Patient Details  Name: Joe BRUNTON Sr. MRN: [id number] Date of Birth: 02/24/1951 Referring Provider:  No ref. provider found  Encounter Date: 11/19/2019   OCCUPATIONAL THERAPY DISCHARGE SUMMARY  Visits from Start of Care: 1 (eval)  Current functional level related to goals / functional outcomes: See eval as pt did not return after eval   Remaining deficits: See eval as pt did not return after eval   Education / Equipment: Not completed as pt did not return  Plan: Patient agrees to discharge.  Patient goals were not met. Patient is being discharged due to not returning since the last visit.  Pt did not return after eval.  Pt/wife prefer home health therapies and nurse case manager is aware.   ?????         [facility] 11/19/2019, 11:37 AM  [place] [address] [place], [place], [address] Phone: [phone]   Fax:  [place], OTR/L [facility] [address] [place], [place]  [address] [phone] phone [phone] 11/19/19 11:41 AM

## 2019-11-20 ENCOUNTER — Ambulatory Visit: Payer: Self-pay | Admitting: Physical Therapy

## 2019-11-20 ENCOUNTER — Telehealth: Payer: Self-pay | Admitting: Family Medicine

## 2019-11-20 NOTE — Telephone Encounter (Signed)
Joe Perry is requesting a call back and is traveling but is requesting Joe Perry to leave message on voice mail it is secure.

## 2019-11-20 NOTE — Telephone Encounter (Signed)
Approved.  

## 2019-11-20 NOTE — Telephone Encounter (Signed)
Staff called Sharyn Lull [phone] and [facility] to call office back to get provider response

## 2019-11-20 NOTE — Telephone Encounter (Signed)
Spoke with Sharyn Lull and informed her with what provider stated and she verbalized understanding.

## 2019-11-21 ENCOUNTER — Ambulatory Visit (HOSPITAL_COMMUNITY)
Admission: RE | Admit: 2019-11-21 | Discharge: 2019-11-21 | Disposition: A | Discharge status: Home / Self Care | Payer: 59 | Source: Ambulatory Visit | Attending: Family Medicine | Admitting: Family Medicine

## 2019-11-21 ENCOUNTER — Other Ambulatory Visit: Payer: Self-pay

## 2019-11-21 DIAGNOSIS — I69391 Dysphagia following cerebral infarction: Secondary | ICD-10-CM | POA: Insufficient documentation

## 2019-11-21 DIAGNOSIS — R059 Cough, unspecified: Secondary | ICD-10-CM

## 2019-11-21 DIAGNOSIS — R05 Cough: Secondary | ICD-10-CM | POA: Insufficient documentation

## 2019-11-21 DIAGNOSIS — R1310 Dysphagia, unspecified: Secondary | ICD-10-CM | POA: Insufficient documentation

## 2019-11-24 NOTE — Telephone Encounter (Signed)
Spoke with Joe Perry and informed her with what provider stated and she verbalized understanding.

## 2019-11-26 ENCOUNTER — Encounter: Payer: Self-pay | Admitting: Speech Pathology

## 2019-11-26 ENCOUNTER — Encounter: Payer: Self-pay | Admitting: Occupational Therapy

## 2019-11-27 ENCOUNTER — Ambulatory Visit: Payer: Self-pay | Admitting: Physical Therapy

## 2019-11-27 ENCOUNTER — Telehealth: Payer: Self-pay

## 2019-11-27 NOTE — Telephone Encounter (Signed)
Joe Perry with [facility] called and states that patient is having black stools and would like to get verbal orders for stool softener.  I gave the verbal orders for the patient Colace needs to be sent to the patients pharmacy.

## 2019-11-28 NOTE — Telephone Encounter (Signed)
noted 

## 2019-12-01 ENCOUNTER — Telehealth: Payer: Self-pay

## 2019-12-01 NOTE — Telephone Encounter (Signed)
Signed orders for SN faxed to [place]

## 2019-12-02 ENCOUNTER — Encounter: Payer: Self-pay | Admitting: Occupational Therapy

## 2019-12-16 ENCOUNTER — Other Ambulatory Visit: Payer: Self-pay

## 2019-12-16 ENCOUNTER — Ambulatory Visit (INDEPENDENT_AMBULATORY_CARE_PROVIDER_SITE_OTHER): Payer: 59 | Admitting: Diagnostic Neuroimaging

## 2019-12-16 ENCOUNTER — Encounter: Payer: Self-pay | Admitting: Diagnostic Neuroimaging

## 2019-12-16 VITALS — BP 145/83 | HR 65 | Temp 97.2°F

## 2019-12-16 DIAGNOSIS — I69319 Unspecified symptoms and signs involving cognitive functions following cerebral infarction: Secondary | ICD-10-CM

## 2019-12-16 DIAGNOSIS — I6300 Cerebral infarction due to thrombosis of unspecified precerebral artery: Secondary | ICD-10-CM | POA: Diagnosis not present

## 2019-12-16 DIAGNOSIS — I69391 Dysphagia following cerebral infarction: Secondary | ICD-10-CM | POA: Diagnosis not present

## 2019-12-16 NOTE — Progress Notes (Signed)
[facility]  PATIENT: Joe CUBBISON Sr. DOB: 10/23/1950  REFERRING CLINICIAN: Antony Blackbird, MD HISTORY FROM: patient and wife  REASON FOR VISIT: new consult / est patient    HISTORICAL  CHIEF COMPLAINT:  Chief Complaint  Patient presents with   Dysphagia, post stroke     rm 6 New Pt  wife- Joe Perry  "choking on foods"   Gait Problem    using walker w/wheels    HISTORY OF PRESENT ILLNESS:    UPDATE (67/13/21, VRP): Since last visit, doing about the same.  Continues to have problems with aphasia, slurred speech, difficulty swallowing, gait difficulty, decreased motivation, and activity.  Patient has home physical therapy, Occupational Therapy, speech therapy but patient tends not to cooperate or show interest in participating.  Patient lives at home with wife.  He does not like taking his medications.  He mainly stays in the chair or bed.  No new symptoms or worsening of prior deficits.  PRIOR HPI (07/24/19, Fara Olden, NP): "69 y.o.malewith history of CVA, HTN, and DB7/02/2019 with L sided weakness and speech difficulty.  Stroke work-up revealed right large PCA infarct as evidenced on MRI status post IR with TICI 2B revascularization secondary to large vessel disease.  He did not receive IV TPA due to being outside window.  CTA head/neck showed right P1 occlusion, small BA with high-grade atheromatous narrowing and left ICA siphon stenosis.  CT perfusion showed 19 cc penumbra.  Due to occlusion, he was transferred to IR with cerebral angio achieving TICI 2B revascularization of occluded R PCOM and P PCA.  Repeat MRI w/wo contrast showed no acute/subacute infarcts left BG and right cerebellar peduncle on 05/02/2019.  2D echo normal EF.  Telemetry monitoring negative for atrial fibrillation during hospitalization.  HIV negative.  LDL 101.  A1c 9.8.  Initially recommended DAPT with aspirin and Plavix for possible allergic reaction to Plavix therefore discharged on aspirin 81  mg and Brilinta 90 mg twice daily.  Prior history of stroke with most recent 11/2018 right frontal and parietal white matter infarcts and signed out AMA.  HTN stable on amlodipine 10 mg daily.  Initiated atorvastatin 40 mg daily.  Uncontrolled DM and discharged on Metformin 5 mg twice daily and recommend close PCP follow-up.  Other stroke risk factors include advanced age and overweight.  Involuntary movement during admission likely due to right thalamic involvement with trial of Abilify when able to tolerate and chorea-like movements slowly improving.  Cognitive decline during admission with agitation and restlessness but EEG unremarkable and initiated Seroquel 25 mg 3 times daily and Aricept 10 mg daily.  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  Residual deficits of nonfluent speech,, dysphagia, left homonymous hemianopia and bilateral L>R chorea-like movements.  Patient was discharged to CIR on 06/03/2019 for ongoing therapy for residual deficits and discharged home on 06/20/2019."    REVIEW OF SYSTEMS: Full 14 system review of systems performed and negative with exception of: as per hPI.  ALLERGIES: No Known Allergies  HOME MEDICATIONS: Outpatient Medications Prior to Visit  Medication Sig Dispense Refill   acetaminophen (TYLENOL) 325 MG tablet Take 2 tablets (650 mg total) by mouth every 4 (four) hours as needed for mild pain (or temp > 37.5 C (99.5 F)).     amLODipine (NORVASC) 5 MG tablet Take 1 tablet (5 mg total) by mouth daily. 30 tablet 2   aspirin EC 81  MG tablet Take 1 tablet (81 mg total) by mouth daily. 30 tablet 3   atorvastatin (LIPITOR) 40 MG tablet Take 1 tablet (40 mg total) by mouth daily at 6 PM. 30 tablet 2   donepezil (ARICEPT) 10 MG tablet Take 1 tablet (10 mg total) by mouth at bedtime. 30 tablet 3   metFORMIN (GLUCOPHAGE) 1000 MG tablet Take 1  tablet (1,000 mg total) by mouth 2 (two) times daily with a meal. 60 tablet 3   QUEtiapine (SEROQUEL) 25 MG tablet Take 2 tablets (50 mg total) by mouth at bedtime. (Patient taking differently: Take 50 mg by mouth at bedtime. Wife reports that pt is taking 1 pill, 2x/day) 60 tablet 3   No facility-administered medications prior to visit.    PAST MEDICAL HISTORY: Past Medical History:  Diagnosis Date   Diabetes mellitus without complication ([place])    Dysphagia    post stroke   Hypertension    Stroke [facility])     PAST SURGICAL HISTORY: Past Surgical History:  Procedure Laterality Date   IR ANGIO INTRA EXTRACRAN SEL COM CAROTID INNOMINATE UNI L MOD SED  03/10/2019   IR ANGIO VERTEBRAL SEL SUBCLAVIAN INNOMINATE BILAT MOD SED  03/10/2019   IR CT HEAD LTD  03/10/2019   IR PERCUTANEOUS ART THROMBECTOMY/INFUSION INTRACRANIAL INC DIAG ANGIO  03/10/2019   RADIOLOGY WITH ANESTHESIA N/A 03/10/2019   Procedure: IR WITH ANESTHESIA/CODE STROKE;  Surgeon: Radiologist, Medication, MD;  Location: [place];  Service: Radiology;  Laterality: N/A;    FAMILY HISTORY: Family History  Problem Relation Age of Onset   Diabetes Other    Hypertension Other    Healthy Mother    Healthy Father     SOCIAL HISTORY: Social History   Socioeconomic History   Marital status: Married    Spouse name: Joe Perry   Number of children: Not on file   Years of education: Not on file   Highest education level: Not on file  Occupational History   Not on file  Tobacco Use   Smoking status: Never Smoker   Smokeless tobacco: Never Used  Substance and Sexual Activity   Alcohol use: No   Drug use: No   Sexual activity: Not Currently  Other Topics Concern   Not on file  Social History Narrative   12/16/19 Lives with wife   Social Determinants of Health   Financial Resource Strain:    Difficulty of Paying Living Expenses:   Food Insecurity:    Worried About Charity fundraiser in the Last Year:     Arboriculturist in the Last Year:   Transportation Needs:    Film/video editor (Medical):    Lack of Transportation (Non-Medical):   Physical Activity:    Days of Exercise per Week:    Minutes of Exercise per Session:   Stress:    Feeling of Stress :   Social Connections:    Frequency of Communication with Friends and Family:    Frequency of Social Gatherings with Friends and Family:    Attends Religious Services:    Active Member of Clubs or Organizations:    Attends Music therapist:    Marital Status:   Intimate Partner Violence:    Fear of Current or Ex-Partner:    Emotionally Abused:    Physically Abused:    Sexually Abused:      PHYSICAL EXAM  GENERAL EXAM/CONSTITUTIONAL: Vitals:  Vitals:   12/16/19 1521 12/16/19 1524  BP: (!) 153/82 Marland Kitchen)  145/83  Pulse: 76 65  Temp: (!) 97.2 F (36.2 C)      There is no height or weight on file to calculate BMI. Wt Readings from Last 3 Encounters:  10/31/19 171 lb (77.6 kg)  09/03/19 176 lb (79.8 kg)  08/05/19 175 lb (79.4 kg)     Patient is in no distress; well developed, nourished and groomed; neck is supple  CARDIOVASCULAR:  Examination of carotid arteries is normal; no carotid bruits  Regular rate and rhythm, no murmurs  Examination of peripheral vascular system by observation and palpation is normal  EYES:  Ophthalmoscopic exam of optic discs and posterior segments is normal; no papilledema or hemorrhages  No exam data present  MUSCULOSKELETAL:  Gait, strength, tone, movements noted in Neurologic exam below  NEUROLOGIC: MENTAL STATUS:  No flowsheet data found.  awake, alert, oriented to person; "[place]", "[place]", "2012, 17, 18"  DECR MEMORY   DECR attention and concentration  DECR FLUENCY; FOLLOWS SIMPLE COMMANDS  DECR fund of knowledge  CRANIAL NERVE:   2nd - no papilledema on fundoscopic exam  2nd, 3rd, 4th, 6th - pupils equal and reactive to light,  visual fields --> DECR ON LEFT SIDE, extraocular muscles intact, no nystagmus  5th - facial sensation symmetric  7th - facial strength symmetric  8th - hearing intact  9th - palate elevates symmetrically, uvula midline  11th - shoulder shrug symmetric  12th - tongue protrusion midline  MODERATE DYSPHAGIA  MOTOR:   normal bulk and tone, DIFFUSE 4/5 strength in the BUE, BLE  SENSORY:   normal and symmetric to light touch  COORDINATION:   finger-nose-finger, fine finger movements SLOW  REFLEXES:   deep tendon reflexes TRACE and symmetric  GAIT/STATION:   RESTLESS; IN TRANSPORT CHAIR     DIAGNOSTIC DATA (LABS, IMAGING, TESTING) - I reviewed patient records, labs, notes, testing and imaging myself where available.  Lab Results  Component Value Date   WBC 6.6 09/03/2019   HGB 13.5 09/03/2019   HCT 41.4 09/03/2019   MCV 92 09/03/2019   PLT 235 09/03/2019      Component Value Date/Time   NA 144 10/15/2019 1205   K 4.9 10/15/2019 1205   CL 105 10/15/2019 1205   CO2 23 10/15/2019 1205   GLUCOSE 137 (H) 10/15/2019 1205   GLUCOSE 94 06/04/2019 0541   BUN 14 10/15/2019 1205   CREATININE 0.81 10/15/2019 1205   CREATININE 0.96 09/08/2013 1735   CALCIUM 9.8 10/15/2019 1205   PROT 6.7 10/15/2019 1205   ALBUMIN 4.2 10/15/2019 1205   AST 13 10/15/2019 1205   ALT 10 10/15/2019 1205   ALKPHOS 64 10/15/2019 1205   BILITOT 0.4 10/15/2019 1205   GFRNONAA 93 10/15/2019 1205   GFRNONAA 86 09/08/2013 1735   GFRAA 107 10/15/2019 1205   GFRAA >89 09/08/2013 1735   Lab Results  Component Value Date   CHOL 159 03/11/2019   HDL 45 03/11/2019   LDLCALC 101 (H) 03/11/2019   TRIG 67 03/11/2019   CHOLHDL 3.5 03/11/2019   Lab Results  Component Value Date   HGBA1C 6.5 (A) 09/03/2019   No results found for: [id number] Lab Results  Component Value Date   TSH 0.647 09/08/2013   05/02/19 MRI brain  1. Resolving diffusion restriction in the right PCA territory, with  patchy residual in the right splenium. Petechial hemorrhage and post ischemic enhancement in the right thalamus and right occipital lobe. No mass effect. 2. Underlying severe chronic ischemic disease with superimposed  new small acute to subacute infarcts in the:  - left basal ganglia. - right cerebellar peduncle. 3. 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    ASSESSMENT AND PLAN  69 y.o. year old male here with:  Dx:  1. Cerebrovascular accident (CVA) due to thrombosis of precerebral artery ([place])   2. Cognitive deficit, post-stroke   3. Dysphagia due to recent stroke     PLAN:  Right PCA stroke + severe chronic small vessel ischemic disease (residual aphasia, dysphagia, gait difficulty) - continue aspirin 81mg , statin, BP control, DM control  - reviewed stroke diagnosis, prognosis and treatment options - continue home PT / OT / ST    Return for return to PCP, pending if symptoms worsen or fail to improve.    Penni Bombard, MD [id number], 0000000 PM Certified in Neurology, Neurophysiology and Neuroimaging  [facility] [address], [place],  [address] [phone]

## 2019-12-16 NOTE — BH Specialist Note (Signed)
Integrated Behavioral Health Visit via Telemedicine (Telephone)  11/17/2019 Joe CARNEGIE Sr. [id number]   Session Start time: 11:00 AM  Session End time: 11:20 AM Total time: 20  Referring Provider: Dr. Chapman Fitch Type of Visit: Telephonic Patient location: Home [facility] Provider location: Office All persons participating in visit: LCSW and Patient  Confirmed patient's address: Yes  Confirmed patient's phone number: Yes  Any changes to demographics: No   Confirmed patient's insurance: Yes  Any changes to patient's insurance: No   Discussed confidentiality: Yes    The following statements were read to the patient and/or legal guardian that are established with the [facility] Provider.  "The purpose of this phone visit is to provide behavioral health care while limiting exposure to the coronavirus (COVID19).  There is a possibility of technology failure and discussed alternative modes of communication if that failure occurs."  "By engaging in this telephone visit, you consent to the provision of healthcare.  Additionally, you authorize for your insurance to be billed for the services provided during this telephone visit."   Patient and/or legal guardian consented to telephone visit: Yes   PRESENTING CONCERNS: Patient and/or family reports the following symptoms/concerns: Pt's wife shared that assistance is needed regarding medicaid application. She is concerned about pt having a persistent cough Duration of problem: Ongoing; Severity of problem: NA  STRENGTHS (Protective Factors/Coping Skills): Pt receives strong support from wife  GOALS ADDRESSED: Patient will: 1.  Reduce symptoms of: stress  2.  Increase knowledge and/or ability of: coping skills  3.  Demonstrate ability to: Increase healthy adjustment to current life circumstances and Increase adequate support systems for patient/family  INTERVENTIONS: Interventions utilized:  Solution-Focused Strategies Standardized  Assessments completed: Not Needed  ASSESSMENT: Patient currently experiencing financial strain triggered by medical bills. Pt has insurance through [facility] and Medicare; however, has difficulty affording co-pays.  LCSW discussed with family how to apply for medicaid. A same day appointment was scheduled for patient regarding cough.  PLAN: 1. Follow up with behavioral health clinician on : Contact LCSW with any additional behavioral health and/or resource needs 2. Behavioral recommendations: Utilize resources discussed 3. Referral(s): [place] (In Clinic)  Rebekah [place], [place] 12/16/19 3:07 PM

## 2019-12-30 ENCOUNTER — Telehealth: Payer: Self-pay | Admitting: Family Medicine

## 2019-12-30 NOTE — Telephone Encounter (Signed)
Sarah form [employer] called saying that the patient wife is the power of attorney and the wife needs a letter saying that the patient is unable to care for himself and that his wife is the one taking care of patient financially and medically.   I called the patient wife and she states that she needs the letter for immigration and that it needs to specify the patient conditions and that she is the caregiver. Patient disability placard also expired and needs a new form filled out. Patient wife will drop off the form tomorrow 12/31/19 and she was informed that once form has been filled out we would reach out to her to come and pick it up.

## 2019-12-31 NOTE — Telephone Encounter (Signed)
Form placed at the front desk. Spoke with patient wife and informed her that form is at the front desk and she stated she will pick it up tomorrow 01-01-2020.

## 2019-12-31 NOTE — Telephone Encounter (Signed)
Patient wife came in and dropped off the disability parking placard form to be filled out by PCP. Form will be placed in PCP box.

## 2020-01-09 NOTE — Telephone Encounter (Signed)
Form completed.

## 2020-01-12 ENCOUNTER — Encounter: Payer: Self-pay | Admitting: Family Medicine

## 2020-01-12 ENCOUNTER — Telehealth: Payer: Self-pay

## 2020-01-12 ENCOUNTER — Telehealth: Payer: Self-pay | Admitting: *Deleted

## 2020-01-12 NOTE — Telephone Encounter (Signed)
Letter has been typed and printed

## 2020-01-12 NOTE — Telephone Encounter (Signed)
Patient dropped of POA paperwork to be scanned into system.  Patient is needing a letter stating that he is not able to take care of him self. He wife is his current caregiver, letter needs to also state medical conditions as well.  Patient is needing letter by 01/15/2020.

## 2020-01-12 NOTE — Telephone Encounter (Signed)
Sarah form [employer] called saying that the patient wife is the power of attorney and the wife needs a letter saying that the patient is unable to care for himself and that his wife is the one taking care of patient financially and medically.    I called the patient wife and she states that she needs the letter for immigration and that it needs to specify the patient conditions and that she is the caregiver. Patient disability placard also expired and needs a new form filled out. Patient wife will drop off the form tomorrow 12/31/19 and she was informed that once form has been filled out we would reach out to her to come and pick it up.

## 2020-01-12 NOTE — Progress Notes (Signed)
Patient ID: Joe BUSTER Sr., male   DOB: 22-Nov-1950, 69 y.o.   MRN: [id number]   Patient's wife needs note for immigration stating patient's medical problems and that she is his sole caregiver and sole financial resource for the family. Patient with history of cerebrovascular accident with residual deficits of nonfluent speech, dysphagia, left homonymous hemianopia and bilateral left greater than right chorea-like movements and gait difficulty-ataxia and gait disturbance status post stroke.  He additionally has cognitive impairments including decrease in memory, decrease in attention and concentration, decreased fluency and decreased fund of knowledge.  He additionally has hypertension, type 2 diabetes and severe chronic ischemic disease.  Letter written for pickup by patient's wife.

## 2020-01-12 NOTE — Telephone Encounter (Signed)
Per patient wife, she needs a letter for the immigration lawyer so that he can send it off to the [place] [employer] stating why she needs to stay in the [place]. Per patient wife, she needs a letter identifying that her husband is not able to care for himself. Also this letter need to say that her husband had cognitive issues, disabled and not able to care for himself and list all of her husband disabilities that is causing him to not care for himself and needing her help.

## 2020-01-12 NOTE — Telephone Encounter (Signed)
Letter has been typed and printed- please contact wife regarding pick-up of letter

## 2020-01-13 NOTE — Telephone Encounter (Signed)
Patient picked up letter from front desk.

## 2020-01-13 NOTE — Telephone Encounter (Signed)
Patient wife picked up letter from front desk

## 2020-01-14 ENCOUNTER — Encounter: Payer: Self-pay | Admitting: Family Medicine

## 2020-01-14 ENCOUNTER — Ambulatory Visit: Payer: 59 | Attending: Family Medicine | Admitting: Family Medicine

## 2020-01-14 ENCOUNTER — Telehealth: Payer: Self-pay

## 2020-01-14 ENCOUNTER — Other Ambulatory Visit: Payer: Self-pay

## 2020-01-14 VITALS — BP 127/82 | HR 72 | Ht 71.0 in | Wt 170.8 lb

## 2020-01-14 DIAGNOSIS — I69391 Dysphagia following cerebral infarction: Secondary | ICD-10-CM | POA: Diagnosis not present

## 2020-01-14 DIAGNOSIS — I69319 Unspecified symptoms and signs involving cognitive functions following cerebral infarction: Secondary | ICD-10-CM

## 2020-01-14 DIAGNOSIS — I6930 Unspecified sequelae of cerebral infarction: Secondary | ICD-10-CM | POA: Diagnosis not present

## 2020-01-14 DIAGNOSIS — E119 Type 2 diabetes mellitus without complications: Secondary | ICD-10-CM | POA: Diagnosis not present

## 2020-01-14 DIAGNOSIS — I69398 Other sequelae of cerebral infarction: Secondary | ICD-10-CM

## 2020-01-14 DIAGNOSIS — I10 Essential (primary) hypertension: Secondary | ICD-10-CM

## 2020-01-14 DIAGNOSIS — R269 Unspecified abnormalities of gait and mobility: Secondary | ICD-10-CM

## 2020-01-14 DIAGNOSIS — E785 Hyperlipidemia, unspecified: Secondary | ICD-10-CM

## 2020-01-14 MED ORDER — AMLODIPINE BESYLATE 5 MG PO TABS: 5.0000 mg | ORAL_TABLET | Freq: Every day | ORAL | 2 refills | Status: DC

## 2020-01-14 MED ORDER — ATORVASTATIN CALCIUM 40 MG PO TABS: ORAL_TABLET | ORAL | 2 refills | Status: DC

## 2020-01-14 NOTE — Progress Notes (Signed)
Established Patient Office Visit  Subjective:  Patient ID: Joe Toronto., male    DOB: 02-27-1951  Age: 69 y.o. MRN: [id number]  CC: Follow-up of chronic issues  HPI Joe Eddy Sr., 69 year old male, Seen in follow-up of chronic medical issues including history of CVA with residual deficits including abnormality of gait and balance, cognitive impairment and post stroke dysphagia, as well as type 2 diabetes, essential hypertension and dyslipidemia.  Per wife, patient has had no falls since his last visit.  He is currently participating in home occupational and physical therapy but wife reports that he needs continued speech therapy.  He continues to have recurrent coughing that occurs with eating/drinking.  Wife continues to be the primary and sole caregiver for the patient and all of his needs.  She wonders if he is eligible for any home health assistance as the wife is fatigued due to being the only caregiver.         Patient denies any issues with joint pain pain, fever or chills or abdominal pain upon questioning.  Wife states that overall he appears to be stable at this time.  Wife has noticed no issues with skin breakdown on patient's feet or any other parts of the body.  She reports that the last time that she attempted to refill his prescription for Metformin, the pharmacy states that there were no available refills.  Patient's blood sugars have been in the 90s to low 100s fasting without any current medication.  Wife reports that patient's appetite is fair.  He will only really eat his favorite foods.  He continues to take his medications daily and wife reports that he has no trouble swallowing the medications.  She sometimes has difficulty getting patient to take the medications however.  Patient also continues to hold food in his mouth or whole saliva in his mouth without swallowing.  Past Medical History:  Diagnosis Date  . Diabetes mellitus without complication ([place])   .  Dysphagia    post stroke  . Hypertension   . Stroke [facility])     Past Surgical History:  Procedure Laterality Date  . IR ANGIO INTRA EXTRACRAN SEL COM CAROTID INNOMINATE UNI L MOD SED  03/10/2019  . IR ANGIO VERTEBRAL SEL SUBCLAVIAN INNOMINATE BILAT MOD SED  03/10/2019  . IR CT HEAD LTD  03/10/2019  . IR PERCUTANEOUS ART THROMBECTOMY/INFUSION INTRACRANIAL INC DIAG ANGIO  03/10/2019  . RADIOLOGY WITH ANESTHESIA N/A 03/10/2019   Procedure: IR WITH ANESTHESIA/CODE STROKE;  Surgeon: Radiologist, Medication, MD;  Location: [facility];  Service: Radiology;  Laterality: N/A;    Family History  Problem Relation Age of Onset  . Diabetes Other   . Hypertension Other   . Healthy Mother   . Healthy Father     Social History   Socioeconomic History  . Marital status: Married    Spouse name: [name]  . Number of children: Not on file  . Years of education: Not on file  . Highest education level: Not on file  Occupational History  . Not on file  Tobacco Use  . Smoking status: Never Smoker  . Smokeless tobacco: Never Used  Substance and Sexual Activity  . Alcohol use: No  . Drug use: No  . Sexual activity: Not Currently  Other Topics Concern  . Not on file  Social History Narrative   12/16/19 Lives with wife   Social Determinants of Health   Financial Resource Strain:   . Difficulty of Paying  Living Expenses:   Food Insecurity:   . Worried About Charity fundraiser in the Last Year:   . Arboriculturist in the Last Year:   Transportation Needs:   . Film/video editor (Medical):   Marland Kitchen Lack of Transportation (Non-Medical):   Physical Activity:   . Days of Exercise per Week:   . Minutes of Exercise per Session:   Stress:   . Feeling of Stress :   Social Connections:   . Frequency of Communication with Friends and Family:   . Frequency of Social Gatherings with Friends and Family:   . Attends Religious Services:   . Active Member of Clubs or Organizations:   . Attends Archivist  Meetings:   Marland Kitchen Marital Status:   Intimate Partner Violence:   . Fear of Current or Ex-Partner:   . Emotionally Abused:   Marland Kitchen Physically Abused:   . Sexually Abused:     Outpatient Medications Prior to Visit  Medication Sig Dispense Refill  . acetaminophen (TYLENOL) 325 MG tablet Take 2 tablets (650 mg total) by mouth every 4 (four) hours as needed for mild pain (or temp > 37.5 C (99.5 F)).    Marland Kitchen amLODipine (NORVASC) 5 MG tablet Take 1 tablet (5 mg total) by mouth daily. 30 tablet 2  . aspirin EC 81 MG tablet Take 1 tablet (81 mg total) by mouth daily. 30 tablet 3  . atorvastatin (LIPITOR) 40 MG tablet Take 1 tablet (40 mg total) by mouth daily at 6 PM. 30 tablet 2  . donepezil (ARICEPT) 10 MG tablet Take 1 tablet (10 mg total) by mouth at bedtime. 30 tablet 3  . metFORMIN (GLUCOPHAGE) 1000 MG tablet Take 1 tablet (1,000 mg total) by mouth 2 (two) times daily with a meal. 60 tablet 3  . QUEtiapine (SEROQUEL) 25 MG tablet Take 2 tablets (50 mg total) by mouth at bedtime. (Patient taking differently: Take 50 mg by mouth at bedtime. Wife reports that pt is taking 1 pill, 2x/day) 60 tablet 3   No facility-administered medications prior to visit.    No Known Allergies  ROS Review of Systems  Reason unable to perform ROS: Wife answers most questions for review of systems due to patient's cognitive issues and verbal output post stroke.  Constitutional: Positive for fatigue. Negative for chills and fever.  HENT: Positive for trouble swallowing. Negative for sore throat.   Respiratory: Positive for choking. Negative for cough (Cough recurs with/after eating) and shortness of breath.   Cardiovascular: Negative for chest pain and palpitations.  Gastrointestinal: Negative for abdominal pain, constipation, diarrhea and nausea.  Endocrine: Negative for polydipsia, polyphagia and polyuria.  Genitourinary: Negative for dysuria and frequency.  Musculoskeletal: Negative for arthralgias and back pain.    Neurological: Positive for weakness. Negative for dizziness and headaches.       Issues with weakness and balance post stroke  Hematological: Negative for adenopathy. Does not bruise/bleed easily.  Psychiatric/Behavioral: Positive for sleep disturbance. Negative for self-injury and suicidal ideas. The patient is not nervous/anxious.       Objective:    Physical Exam  Constitutional: He is oriented to person, place, and time. He appears well-developed and well-nourished.  WNWD older male in NAD sitting in a chair in exam room with walker nearby. He is accompanied by his wife.  Neck: No JVD present.  Cardiovascular: Normal rate and regular rhythm.  Pulmonary/Chest: Effort normal and breath sounds normal.  Decreased lung sounds at the lung  bases  Abdominal: Soft. There is no abdominal tenderness. There is no rebound and no guarding.  Musculoskeletal:        General: No tenderness or edema.     Cervical back: Normal range of motion and neck supple.  Neurological: He is alert and oriented to person, place, and time.  Alert at times but then appears to fall asleep; decreased strength and ROM of lower extremities-3/5  Skin: Skin is warm and dry.  Psychiatric:  Flattened affect; paucity of movement/facial expressions  Nursing note and vitals reviewed.  BP 127/82 BP (!) 154/84   Pulse 62   Ht 5\' 11"  (1.803 m)   Wt 170 lb 12.8 oz (77.5 kg)   SpO2 99%   BMI 23.82 kg/m   Repeat BP- BP 127/82   Pulse 72   Ht 5\' 11"  (1.803 m)   Wt 170 lb 12.8 oz (77.5 kg)   SpO2 99%   BMI 23.82 kg/m  Wt Readings from Last 3 Encounters:  01/14/20 170 lb 12.8 oz (77.5 kg)  10/31/19 171 lb (77.6 kg)  09/03/19 176 lb (79.8 kg)     Health Maintenance Due  Topic Date Due  . FOOT EXAM  Never done  . OPHTHALMOLOGY EXAM  Never done  . COLONOSCOPY  Never done     Lab Results  Component Value Date   TSH 0.647 09/08/2013   Lab Results  Component Value Date   WBC 6.6 09/03/2019   HGB 13.5  09/03/2019   HCT 41.4 09/03/2019   MCV 92 09/03/2019   PLT 235 09/03/2019   Lab Results  Component Value Date   NA 144 10/15/2019   K 4.9 10/15/2019   CO2 23 10/15/2019   GLUCOSE 137 (H) 10/15/2019   BUN 14 10/15/2019   CREATININE 0.81 10/15/2019   BILITOT 0.4 10/15/2019   ALKPHOS 64 10/15/2019   AST 13 10/15/2019   ALT 10 10/15/2019   PROT 6.7 10/15/2019   ALBUMIN 4.2 10/15/2019   CALCIUM 9.8 10/15/2019   ANIONGAP 14 06/04/2019   Lab Results  Component Value Date   CHOL 159 03/11/2019   Lab Results  Component Value Date   HDL 45 03/11/2019   Lab Results  Component Value Date   LDLCALC 101 (H) 03/11/2019   Lab Results  Component Value Date   TRIG 67 03/11/2019   Lab Results  Component Value Date   CHOLHDL 3.5 03/11/2019   Lab Results  Component Value Date   HGBA1C 6.5 (A) 09/03/2019      Assessment & Plan:  1. Essential hypertension Blood pressure stable and controlled.  Repeat blood pressure was 127/82.  Refills provided for patient's amlodipine.  Continue monitoring of blood pressure and low-sodium diet.  Continue atorvastatin due to history of CVA - amLODipine (NORVASC) 5 MG tablet; Take 1 tablet (5 mg total) by mouth daily. To lower blood pressure  Dispense: 90 tablet; Refill: 2 - atorvastatin (LIPITOR) 40 MG tablet; One daily in the evening to lower cholesterol  Dispense: 90 tablet; Refill: 2  2. History of CVA with residual deficit Patient with continued issues with verbal output and swallowing/dysphagia status post CVA and referral is being placed for speech therapy.  Prescription provided for refill of atorvastatin due to history of CVA. - Ambulatory referral to Speech Therapy - atorvastatin (LIPITOR) 40 MG tablet; One daily in the evening to lower cholesterol  Dispense: 90 tablet; Refill: 2  3. Type 2 diabetes mellitus without complication, without long-term current use of  insulin [facility]) Wife reports that patient has not recently been on Metformin  and I discussed with the wife that with patient's decreased oral intake and blood sugars that are in the 90s to low 100s fasting that he may not need Metformin at this time.  Will obtain hemoglobin A1c and basic metabolic panel and depending on the results, wife will be notified as to whether or not Metformin refill is needed at this time.  If patient has increase in home blood sugars then wife should notify office. - Hemoglobin [id number] - Basic Metabolic Panel  4. Dysphagia, post-stroke Discussed with wife that patient's cough with eating/drinking is likely related to his dysphagia status post CVA.  Patient has had prior evaluation showing the need for changes in food textures related to his dysphagia.  New referral will be placed to speech therapy. - Ambulatory referral to Speech Therapy  5. Gait disturbance, post-stroke He is receiving occupational and physical therapy at this time and wife reports improvement in transfers and he has had no falls since his last visit.  6. Cognitive deficit, post-stroke Wife continues to provide total care for the patient as he is unable to manage medications and has issues remembering instructions and participating in his care.  Will contact the nurse case manager for this office to contact the patient's wife regarding availability of home care resources.  7. Dyslipidemia, goal LDL below 70 Patient with history of CVA and has dyslipidemia.  New prescription provided for continuation of treatment with atorvastatin. - atorvastatin (LIPITOR) 40 MG tablet; One daily in the evening to lower cholesterol  Dispense: 90 tablet; Refill: 2   An After Visit Summary was printed and given to the patient/wife..   Follow-up: Return in about 3 months (around 04/15/2020) for chronic issues. Sooner if needed   35 or more minutes of face-to-face time was spent with the patient and his wife at today's visit with additional 14 minutes including review of chart for past labs/specialty  visits and evaluations/imaging/procedures and completion of today's note. Antony Blackbird, MD

## 2020-01-14 NOTE — Progress Notes (Signed)
170.8lb   Need refills for all of his medication

## 2020-01-14 NOTE — Telephone Encounter (Signed)
Call placed to patient's wife regarding home health services. She explained that her husband requires 24/7 care and she needs someone to come 4 hours /day a couple of days a week to be with him so she can sleep. Explained to her that medicare does generally not cover that type of care.  He would need to have medicaid. She said that he is not eligible for medicaid until July.    This CM to contact if other resources become available prior to July

## 2020-01-15 LAB — BASIC METABOLIC PANEL WITH GFR
BUN/Creatinine Ratio: 18 (ref 10–24)
BUN: 13 mg/dL (ref 8–27)
CO2: 25 mmol/L (ref 20–29)
Calcium: 9.9 mg/dL (ref 8.6–10.2)
Chloride: 102 mmol/L (ref 96–106)
Creatinine, Ser: 0.74 mg/dL — ABNORMAL LOW (ref 0.76–1.27)
GFR calc Af Amer: 111 mL/min/1.73
GFR calc non Af Amer: 96 mL/min/1.73
Glucose: 67 mg/dL (ref 65–99)
Potassium: 4.7 mmol/L (ref 3.5–5.2)
Sodium: 143 mmol/L (ref 134–144)

## 2020-01-15 LAB — HEMOGLOBIN A1C
Est. average glucose Bld gHb Est-mCnc: 120 mg/dL
Hgb A1c MFr Bld: 5.8 % — ABNORMAL HIGH (ref 4.8–5.6)

## 2020-01-15 NOTE — Telephone Encounter (Signed)
Call placed to patient's wife and explained [employer] ( [employer] ).  If the patient qualifies, he could be eligible for up to 35 hours/week of assistance. He can apply even before being approved for medicaid. They will ask him to complete a long term medicaid application.   Explained to her that she needs to call [employer] for an application and to be placed on the wait ist.  Provided her with # for Alicia Amel # [phone] and encouraged her to call for the application. Instructed her to contact this clinic if she has any questions.

## 2020-02-11 ENCOUNTER — Encounter (HOSPITAL_COMMUNITY): Payer: Self-pay

## 2020-02-11 ENCOUNTER — Ambulatory Visit (HOSPITAL_COMMUNITY)
Admission: EM | Admit: 2020-02-11 | Discharge: 2020-02-11 | Disposition: A | Discharge status: Home / Self Care | Payer: 59 | Attending: Family Medicine | Admitting: Family Medicine

## 2020-02-11 ENCOUNTER — Telehealth: Payer: Self-pay

## 2020-02-11 ENCOUNTER — Other Ambulatory Visit: Payer: Self-pay

## 2020-02-11 DIAGNOSIS — R8290 Unspecified abnormal findings in urine: Secondary | ICD-10-CM | POA: Diagnosis not present

## 2020-02-11 NOTE — ED Triage Notes (Signed)
Pt and family report that pt has had strong odor to urine for approx 5 days. Denies abdom pain, flank/back pain, n/v/d, fever, chills or changes to mentation, or burning with urination/urgency. Wife reports recent d/c of metformin for pt's DM2.

## 2020-02-11 NOTE — ED Notes (Signed)
Pt unable to provide urine sample despite running water and wife performing bladder Cred maneuver. Encouraged pt to drink water and re-attempt urine collection in 10-15 minutes.

## 2020-02-11 NOTE — Discharge Instructions (Addendum)
You may have a urinary tract infection. We are going to culture your urine and will call you as soon as we have the results.   Drink plenty of water, 8-10 glasses per day.   You may take AZO over the counter for painful urination.  Follow up with your primary care provider as needed.   Go to the Emergency Department if you experience severe pain, shortness of breath, high fever, or other concerns.   

## 2020-02-11 NOTE — ED Provider Notes (Signed)
[facility]   CC: UTI  SUBJECTIVE:  Joe KAMAKA Sr. is a 69 y.o. male who complains of malodorous urine for the last 2-3 days. Wife reports that the patient has not been drinking as much water as usual and has not been voiding as often as usual. Patient denies a precipitating event, recent sexual encounter, excessive caffeine intake. Symptoms are made worse with urination.  Admits to similar symptoms in the past. Denies fever, chills, nausea, vomiting, abdominal pain, flank pain, hematuria.    LMP: No LMP for male patient.  ROS: As in HPI.  All other pertinent ROS negative.     Past Medical History:  Diagnosis Date  . Diabetes mellitus without complication ([place])   . Dysphagia    post stroke  . Hypertension   . Stroke [facility])    Past Surgical History:  Procedure Laterality Date  . IR ANGIO INTRA EXTRACRAN SEL COM CAROTID INNOMINATE UNI L MOD SED  03/10/2019  . IR ANGIO VERTEBRAL SEL SUBCLAVIAN INNOMINATE BILAT MOD SED  03/10/2019  . IR CT HEAD LTD  03/10/2019  . IR PERCUTANEOUS ART THROMBECTOMY/INFUSION INTRACRANIAL INC DIAG ANGIO  03/10/2019  . RADIOLOGY WITH ANESTHESIA N/A 03/10/2019   Procedure: IR WITH ANESTHESIA/CODE STROKE;  Surgeon: Radiologist, Medication, MD;  Location: [facility];  Service: Radiology;  Laterality: N/A;   No Known Allergies No current facility-administered medications on file prior to encounter.   Current Outpatient Medications on File Prior to Encounter  Medication Sig Dispense Refill  . amLODipine (NORVASC) 5 MG tablet Take 1 tablet (5 mg total) by mouth daily. To lower blood pressure 90 tablet 2  . aspirin EC 81 MG tablet Take 1 tablet (81 mg total) by mouth daily. 30 tablet 3  . atorvastatin (LIPITOR) 40 MG tablet One daily in the evening to lower cholesterol 90 tablet 2  . donepezil (ARICEPT) 10 MG tablet Take 1 tablet (10 mg total) by mouth at bedtime. 30 tablet 3  . QUEtiapine (SEROQUEL) 25 MG tablet Take 2 tablets (50 mg total) by mouth at  bedtime. (Patient taking differently: Take 50 mg by mouth at bedtime. Wife reports that pt is taking 1 pill, 2x/day) 60 tablet 3  . acetaminophen (TYLENOL) 325 MG tablet Take 2 tablets (650 mg total) by mouth every 4 (four) hours as needed for mild pain (or temp > 37.5 C (99.5 F)).     Social History   Socioeconomic History  . Marital status: Married    Spouse name: [name]  . Number of children: Not on file  . Years of education: Not on file  . Highest education level: Not on file  Occupational History  . Not on file  Tobacco Use  . Smoking status: Never Smoker  . Smokeless tobacco: Never Used  Substance and Sexual Activity  . Alcohol use: No  . Drug use: No  . Sexual activity: Not Currently  Other Topics Concern  . Not on file  Social History Narrative   12/16/19 Lives with wife   Social Determinants of Health   Financial Resource Strain:   . Difficulty of Paying Living Expenses:   Food Insecurity:   . Worried About Charity fundraiser in the Last Year:   . Arboriculturist in the Last Year:   Transportation Needs:   . Film/video editor (Medical):   Marland Kitchen Lack of Transportation (Non-Medical):   Physical Activity:   . Days of Exercise per Week:   . Minutes of Exercise  per Session:   Stress:   . Feeling of Stress :   Social Connections:   . Frequency of Communication with Friends and Family:   . Frequency of Social Gatherings with Friends and Family:   . Attends Religious Services:   . Active Member of Clubs or Organizations:   . Attends Archivist Meetings:   Marland Kitchen Marital Status:   Intimate Partner Violence:   . Fear of Current or Ex-Partner:   . Emotionally Abused:   Marland Kitchen Physically Abused:   . Sexually Abused:    Family History  Problem Relation Age of Onset  . Diabetes Other   . Hypertension Other   . Healthy Mother   . Healthy Father     OBJECTIVE:  Vitals:   02/11/20 1659  BP: 127/73  Pulse: (!) 58  Resp: 16  Temp: 97.9 F (36.6 C)  SpO2:  100%   General appearance: AOx3 in no acute distress HEENT: NCAT.  Oropharynx clear.  Lungs: clear to auscultation bilaterally without adventitious breath sounds Heart: regular rate and rhythm.  Radial pulses 2+ symmetrical bilaterally Abdomen: soft; non-distended; no tenderness; bowel sounds present; no guarding or rebound tenderness Back: no CVA tenderness Extremities: no edema; symmetrical with no gross deformities Skin: warm and dry Neurologic: Ambulates from chair to exam table without difficulty Psychological: alert and cooperative; normal mood and affect  Labs Reviewed - No data to display  ASSESSMENT & PLAN:  No diagnosis found.  No orders of the defined types were placed in this encounter.  Malodorous Urine Unable to obtain specimen at this visit Future order placed for UA tomorrow, Push fluids and get plenty of rest.   Take antibiotic as directed and to completion Take pyridium as prescribed and as needed for symptomatic relief Follow up with PCP if symptoms persists Return here or go to ER if you have any new or worsening symptoms such as fever, worsening abdominal pain, nausea/vomiting, flank pain  Outlined signs and symptoms indicating need for more acute intervention. Patient verbalized understanding. After Visit Summary given.      Faustino Congress, NP 02/11/20 1845

## 2020-02-11 NOTE — ED Notes (Signed)
Patient unable to void at this time

## 2020-02-11 NOTE — Telephone Encounter (Signed)
Pt wife called the contacted the office and stated pt is having some issues with his urine. Pt wife states he doesn't drink water and pt urine has an order

## 2020-02-12 ENCOUNTER — Telehealth (HOSPITAL_COMMUNITY): Payer: Self-pay | Admitting: Family Medicine

## 2020-02-12 DIAGNOSIS — R300 Dysuria: Secondary | ICD-10-CM | POA: Diagnosis not present

## 2020-02-12 MED ORDER — SULFAMETHOXAZOLE-TRIMETHOPRIM 800-160 MG PO TABS: 1.0000 | ORAL_TABLET | Freq: Two times a day (BID) | ORAL | 0 refills | Status: DC

## 2020-02-12 NOTE — Telephone Encounter (Signed)
Called pt spoke with the wife / instructed to drink plenty of water through out the day and schedule a f /u with a PCP if continue/Wife stated he was seen last night in the Ed for the given symptoms. Advised wife to f /u with PCP. Verbalized understanding

## 2020-02-12 NOTE — Telephone Encounter (Signed)
Pt Returned  today with urine sample Urine positive for UTI.  Sending for culture. Treating with Bactrim

## 2020-02-13 LAB — POCT URINALYSIS DIP (DEVICE)
Bilirubin Urine: NEGATIVE
Glucose, UA: NEGATIVE mg/dL
Ketones, ur: NEGATIVE mg/dL
Nitrite: POSITIVE — AB
Protein, ur: NEGATIVE mg/dL
Specific Gravity, Urine: 1.025 (ref 1.005–1.030)
Urobilinogen, UA: 1 mg/dL (ref 0.0–1.0)
pH: 6 (ref 5.0–8.0)

## 2020-02-14 LAB — URINE CULTURE: Culture: >=100000 — AB

## 2020-02-17 ENCOUNTER — Other Ambulatory Visit: Payer: Self-pay

## 2020-02-17 ENCOUNTER — Encounter (INDEPENDENT_AMBULATORY_CARE_PROVIDER_SITE_OTHER): Payer: Self-pay | Admitting: Primary Care

## 2020-02-17 ENCOUNTER — Ambulatory Visit (INDEPENDENT_AMBULATORY_CARE_PROVIDER_SITE_OTHER): Payer: 59 | Admitting: Primary Care

## 2020-02-17 VITALS — BP 123/79 | HR 66 | Temp 97.3°F | Ht 71.0 in | Wt 168.8 lb

## 2020-02-17 DIAGNOSIS — Z09 Encounter for follow-up examination after completed treatment for conditions other than malignant neoplasm: Secondary | ICD-10-CM

## 2020-02-17 DIAGNOSIS — R059 Cough, unspecified: Secondary | ICD-10-CM

## 2020-02-17 DIAGNOSIS — R05 Cough: Secondary | ICD-10-CM | POA: Diagnosis not present

## 2020-02-17 DIAGNOSIS — R0689 Other abnormalities of breathing: Secondary | ICD-10-CM

## 2020-02-17 MED ORDER — SULFAMETHOXAZOLE-TRIMETHOPRIM 200-40 MG/5ML PO SUSP: 20.0000 mL | Freq: Two times a day (BID) | ORAL | 0 refills | Status: DC

## 2020-02-17 MED ORDER — GUAIFENESIN 100 MG/5ML PO SOLN: 5.0000 mL | ORAL | 0 refills | Status: DC | PRN

## 2020-02-17 NOTE — Progress Notes (Signed)
Established Patient Office Visit  Subjective:  Patient ID: Joe Perry., male    DOB: 28-Sep-1950  Age: 69 y.o. MRN: [id number]  CC:  Chief Complaint  Patient presents with  . Hospitalization Follow-up    stroke    HPI Mr. NAYEF COLLEGE Sr.is a patient of Dr. Chapman Fitch he will continue care with his care with her as a PCP. He presents to [facility] for hospital follow up. Presented to Urgent care 02/11/2020 for malodorous urine. He presents with his wife she complains she is not able to get him to take his medication or drink fluids. Advised all she could do is try. Asked patient if changed medication for UTI would he take it he nodded his head.  Past Medical History:  Diagnosis Date  . Diabetes mellitus without complication ([place])   . Dysphagia    post stroke  . Hypertension   . Stroke [facility])     Past Surgical History:  Procedure Laterality Date  . IR ANGIO INTRA EXTRACRAN SEL COM CAROTID INNOMINATE UNI L MOD SED  03/10/2019  . IR ANGIO VERTEBRAL SEL SUBCLAVIAN INNOMINATE BILAT MOD SED  03/10/2019  . IR CT HEAD LTD  03/10/2019  . IR PERCUTANEOUS ART THROMBECTOMY/INFUSION INTRACRANIAL INC DIAG ANGIO  03/10/2019  . RADIOLOGY WITH ANESTHESIA N/A 03/10/2019   Procedure: IR WITH ANESTHESIA/CODE STROKE;  Surgeon: Radiologist, Medication, MD;  Location: [facility];  Service: Radiology;  Laterality: N/A;    Family History  Problem Relation Age of Onset  . Diabetes Other   . Hypertension Other   . Healthy Mother   . Healthy Father     Social History   Socioeconomic History  . Marital status: Married    Spouse name: [name]  . Number of children: Not on file  . Years of education: Not on file  . Highest education level: Not on file  Occupational History  . Not on file  Tobacco Use  . Smoking status: Never Smoker  . Smokeless tobacco: Never Used  Vaping Use  . Vaping Use: Never used  Substance and Sexual Activity  . Alcohol use: No  . Drug use: No  . Sexual activity: Not Currently  Other  Topics Concern  . Not on file  Social History Narrative   12/16/19 Lives with wife   Social Determinants of Health   Financial Resource Strain:   . Difficulty of Paying Living Expenses:   Food Insecurity:   . Worried About Charity fundraiser in the Last Year:   . Arboriculturist in the Last Year:   Transportation Needs:   . Film/video editor (Medical):   Marland Kitchen Lack of Transportation (Non-Medical):   Physical Activity:   . Days of Exercise per Week:   . Minutes of Exercise per Session:   Stress:   . Feeling of Stress :   Social Connections:   . Frequency of Communication with Friends and Family:   . Frequency of Social Gatherings with Friends and Family:   . Attends Religious Services:   . Active Member of Clubs or Organizations:   . Attends Archivist Meetings:   Marland Kitchen Marital Status:   Intimate Partner Violence:   . Fear of Current or Ex-Partner:   . Emotionally Abused:   Marland Kitchen Physically Abused:   . Sexually Abused:     Outpatient Medications Prior to Visit  Medication Sig Dispense Refill  . acetaminophen (TYLENOL) 325 MG tablet Take 2 tablets (650 mg total) by  mouth every 4 (four) hours as needed for mild pain (or temp > 37.5 C (99.5 F)).    Marland Kitchen amLODipine (NORVASC) 5 MG tablet Take 1 tablet (5 mg total) by mouth daily. To lower blood pressure 90 tablet 2  . aspirin EC 81 MG tablet Take 1 tablet (81 mg total) by mouth daily. 30 tablet 3  . atorvastatin (LIPITOR) 40 MG tablet One daily in the evening to lower cholesterol 90 tablet 2  . donepezil (ARICEPT) 10 MG tablet Take 1 tablet (10 mg total) by mouth at bedtime. 30 tablet 3  . QUEtiapine (SEROQUEL) 25 MG tablet Take 2 tablets (50 mg total) by mouth at bedtime. (Patient taking differently: Take 50 mg by mouth at bedtime. Wife reports that pt is taking 1 pill, 2x/day) 60 tablet 3  . sulfamethoxazole-trimethoprim (BACTRIM DS) 800-160 MG tablet Take 1 tablet by mouth 2 (two) times daily for 7 days. 14 tablet 0   No  facility-administered medications prior to visit.    No Known Allergies  ROS Review of Systems  Psychiatric/Behavioral: Positive for behavioral problems, decreased concentration and dysphoric mood.  All other systems reviewed and are negative.     Objective:    Physical Exam Vitals reviewed.  Constitutional:      Appearance: Normal appearance. He is normal weight.  HENT:     Head: Normocephalic.  Cardiovascular:     Rate and Rhythm: Normal rate and regular rhythm.  Pulmonary:     Effort: Pulmonary effort is normal.     Breath sounds: Wheezing and rhonchi present.  Abdominal:     General: Bowel sounds are normal.  Musculoskeletal:     Cervical back: Normal range of motion.     Comments: Unstable gait uses a walker   Skin:    General: Skin is warm and dry.  Neurological:     Mental Status: He is disoriented.     Gait: Gait abnormal.     BP 123/79 (BP Location: Right Arm, Patient Position: Sitting, Cuff Size: Normal)   Pulse 66   Temp (!) 97.3 F (36.3 C) (Temporal)   Ht 5\' 11"  (1.803 m)   Wt 168 lb 12.8 oz (76.6 kg)   SpO2 98%   BMI 23.54 kg/m  Wt Readings from Last 3 Encounters:  02/17/20 168 lb 12.8 oz (76.6 kg)  01/14/20 170 lb 12.8 oz (77.5 kg)  10/31/19 171 lb (77.6 kg)     Health Maintenance Due  Topic Date Due  . FOOT EXAM  Never done  . OPHTHALMOLOGY EXAM  Never done  . COLONOSCOPY  Never done    There are no preventive care reminders to display for this patient.  Lab Results  Component Value Date   TSH 0.647 09/08/2013   Lab Results  Component Value Date   WBC 6.6 09/03/2019   HGB 13.5 09/03/2019   HCT 41.4 09/03/2019   MCV 92 09/03/2019   PLT 235 09/03/2019   Lab Results  Component Value Date   NA 143 01/14/2020   K 4.7 01/14/2020   CO2 25 01/14/2020   GLUCOSE 67 01/14/2020   BUN 13 01/14/2020   CREATININE 0.74 (L) 01/14/2020   BILITOT 0.4 10/15/2019   ALKPHOS 64 10/15/2019   AST 13 10/15/2019   ALT 10 10/15/2019   PROT  6.7 10/15/2019   ALBUMIN 4.2 10/15/2019   CALCIUM 9.9 01/14/2020   ANIONGAP 14 06/04/2019   Lab Results  Component Value Date   CHOL 159 03/11/2019  Lab Results  Component Value Date   HDL 45 03/11/2019   Lab Results  Component Value Date   LDLCALC 101 (H) 03/11/2019   Lab Results  Component Value Date   TRIG 67 03/11/2019   Lab Results  Component Value Date   CHOLHDL 3.5 03/11/2019   Lab Results  Component Value Date   HGBA1C 5.8 (H) 01/14/2020      Assessment & Plan:  Roderic was seen today for hospitalization follow-up.  Diagnoses and all orders for this visit:  Hospital discharge follow-up Treated with antibiotics for UTI patient unable to swallow pills change to liquid 41ml bid for 3 days -     sulfamethoxazole-trimethoprim (BACTRIM) 200-40 MG/5ML suspension; Take 20 mLs by mouth 2 (two) times daily. -      Adventitious breath sounds -     DG Chest 2 View; Future  Cough guaiFENesin (ROBITUSSIN) 100 MG/5ML SOLN; Take 5 mLs (100 mg total) by mouth every 4 (four) hours as needed for cough or to loosen phlegm.    Meds ordered this encounter  Medications  . sulfamethoxazole-trimethoprim (BACTRIM) 200-40 MG/5ML suspension    Sig: Take 20 mLs by mouth 2 (two) times daily.    Dispense:  100 mL    Refill:  0  . guaiFENesin (ROBITUSSIN) 100 MG/5ML SOLN    Sig: Take 5 mLs (100 mg total) by mouth every 4 (four) hours as needed for cough or to loosen phlegm.    Dispense:  236 mL    Refill:  0    Follow-up: Return for Follow up with PCP as scheduled.Kerin Perna, NP

## 2020-02-17 NOTE — Patient Instructions (Signed)
Acute Urinary Retention, Male ° °Acute urinary retention means that you cannot pee (urinate) at all, or that you pee too little and your bladder is not emptied completely. If it is not treated, it can lead to kidney damage or other serious problems. °Follow these instructions at home: °· Take over-the-counter and prescription medicines only as told by your doctor. Ask your doctor what medicines you should stay away from. Do not take any medicine unless your doctor says it is okay to do so. °· If you were sent home with a tube that drains the bladder (catheter), take care of it as told by your doctor. °· Drink enough fluid to keep your pee clear or pale yellow. °· If you were given an antibiotic, take it as told by your doctor. Do not stop taking the antibiotic even if you start to feel better. °· Do not use any products that contain nicotine or tobacco, such as cigarettes and e-cigarettes. If you need help quitting, ask your doctor. °· Watch for changes in your symptoms. Tell your doctor about them. °· If told, track changes in your blood pressure at home. Tell your doctor about them. °· Keep all follow-up visits as told by your doctor. This is important. °Contact a doctor if: °· You have spasms or you leak pee when you have spasms. °Get help right away if: °· You have chills or a fever. °· You have a tube that drains the bladder and: °? The tube stops draining pee. °? The tube falls out. °· You have blood in your pee. °Summary °· Acute urinary retention means that you have problems peeing. It may mean that you cannot pee at all, or that you pee too little. °· If this condition is not treated, it can lead to kidney damage or other serious problems. °· If you were sent home with a tube that drains the bladder, take care of it as told by your doctor. °· Monitor any changes in your symptoms. Tell your doctor about any changes. °This information is not intended to replace advice given to you by your health care  provider. Make sure you discuss any questions you have with your health care provider. °Document Revised: 11/07/2018 Document Reviewed: 09/22/2016 °Elsevier Patient Education © 2020 Elsevier Inc. ° °

## 2020-03-15 ENCOUNTER — Other Ambulatory Visit: Payer: Self-pay

## 2020-03-15 ENCOUNTER — Ambulatory Visit: Payer: Medicare Other | Attending: Primary Care

## 2020-03-15 DIAGNOSIS — R1312 Dysphagia, oropharyngeal phase: Secondary | ICD-10-CM | POA: Diagnosis not present

## 2020-03-15 DIAGNOSIS — R471 Dysarthria and anarthria: Secondary | ICD-10-CM

## 2020-03-15 DIAGNOSIS — R41841 Cognitive communication deficit: Secondary | ICD-10-CM | POA: Diagnosis present

## 2020-03-15 NOTE — Therapy (Addendum)
[facility] [address] [place], [place], [address] Phone: [phone]   Fax:  [phone]  Speech Language Pathology Evaluation  Patient Details  Name: Joe PARKERSON Sr. MRN: [id number] Date of Birth: October 11, 1950 Referring Provider (SLP): Joe Perry   Encounter Date: 03/15/2020   End of Session - 03/15/20 2210    Visit Number 1    Number of Visits 17    Date for SLP Re-Evaluation 06/11/20   90 days   Authorization Type self pay at this time    SLP Start Time 1321    SLP Stop Time  1403    SLP Time Calculation (min) 42 min    Activity Tolerance Patient limited by lethargy           Past Medical History:  Diagnosis Date  . Diabetes mellitus without complication ([place])   . Dysphagia    post stroke  . Hypertension   . Stroke [facility])     Past Surgical History:  Procedure Laterality Date  . IR ANGIO INTRA EXTRACRAN SEL COM CAROTID INNOMINATE UNI L MOD SED  03/10/2019  . IR ANGIO VERTEBRAL SEL SUBCLAVIAN INNOMINATE BILAT MOD SED  03/10/2019  . IR CT HEAD LTD  03/10/2019  . IR PERCUTANEOUS ART THROMBECTOMY/INFUSION INTRACRANIAL INC DIAG ANGIO  03/10/2019  . RADIOLOGY WITH ANESTHESIA N/A 03/10/2019   Procedure: IR WITH ANESTHESIA/CODE STROKE;  Surgeon: Radiologist, Medication, MD;  Location: [facility];  Service: Radiology;  Laterality: N/A;    There were no vitals filed for this visit.   Subjective Assessment - 03/15/20 2120    Subjective "My wife." (pt, 7 seconds after SLP asked "Who came with you?")    Patient is accompained by: Family member   spouse   Currently in Pain? No/denies              SLP Evaluation [facility] - 03/15/20 2120      SLP Visit Information   SLP Received On 03/15/20    Referring Provider (SLP) Joe Perry    Onset Date July 2020    Medical Diagnosis CVA      Subjective   Patient/Family Stated Goal To get him to drink water      General Information   HPI  Pt is a 69 y.o. male s/p CVA in  March and July 2020.  Pt only received home health PT and OT in April 2021 but wife states no ST. Wife states pt will not drink liquids and instead holds them in his mouth. Wife states when pt wakes up pt wife gives him 16 ounces of water "That is the only time he will drink water," wife states. 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  Pt with PMH that includes: prior CVA 2017, 11/2018, 03/2019, DM, dyslipidemia, HTN, urinary incontinence. MBSS 03/13/19 revealed moderate oropharyngeal dyphagia with oral holding, delayed oral A-P transit, delayed swallow onset. Recl Dys 2 and thin liquids. Most recent MBSS in March 2021 was like previous MBSS in july 2020, but recommended regular diet and nectar liquids due to silent aspiration with thin liquids.       Prior Functional Status   Cognitive/Linguistic Baseline Within functional limits    Type of Home House     Lives With Spouse    Vocation Retired      Associate Professor   Overall Cognitive Status Impaired/Different from baseline    Area  of Impairment Attention;Memory;Following commands;Safety/judgement;Problem solving    Current Attention Level Sustained    Memory Decreased short-term memory    Following Commands Follows one step commands inconsistently   oral motor tasks, with a model   Safety/Judgement Decreased awareness of safety;Decreased awareness of deficits      Auditory Comprehension   Overall Auditory Comprehension Impaired    Conversation Simple    Interfering Components Attention;Processing speed    EffectiveTechniques Extra processing time;Visual/Gestural cues      Verbal Expression   Overall Verbal Expression Impaired      Oral Motor/Sensory Function   Overall Oral Motor/Sensory Function Impaired    Labial Symmetry Abnormal symmetry right    Labial Coordination Reduced    Lingual ROM Reduced right    Lingual Symmetry Within  Functional Limits    Lingual Coordination Reduced      Motor Speech   Overall Motor Speech Impaired    Respiration Impaired    Level of Impairment Phrase    Phonation Low vocal intensity   < 70dB   Resonance Within functional limits    Articulation Within functional limitis    Intelligibility Intelligibility reduced   due mostly to volume   Conversation 75-100% accurate          Pt with poor management of secretions; holding saliva in his mouth until told to swallow- average trigger time was 11 seconds when consistently cued to swallow saliva/secretions. Tactile cues were seen as effective during MBSS for eliciting a swallow but today were not any more or less effective than verbal cues to swallow. Pt with audible inhalation and exhalation today - last MD pulmonary check included rhonchi and wheezing. Wife denies pt has been febrile in past 4 weeks, nor coughs throughout the day nor indicates chest pain or tightness in any way. SLP suspects PNA, however, due to pt wife giving him water each day. SLP again reiterated liqiuds must be of tomato juice consistency. Wife suggested V-8 juice which SLP encouraged. Wife stated, "He cannot have PNA. I rub (menthol) balm on him each day after the shower. The balm is for his health." Wife also states, culturally, she believes cold liquids cause PNA. SLP again educated wife what causes PNA and encouraged wife to try cold sour liquids to foster pt's sensory system to hasten pt's swallow reflex/response with liquids. Today, pt was provided dime-size bites of peanut butter cracker and nectar Sprite. Pt coughed immediately with 1/4 bites of cracker but did not with 3 sips of Sprite. (Pt is silent aspirator) SLP cued pt for small sips each time. Wife explained that it is difficult to have pt take meds as he will not swallow any liquids except upon waking up in the morning. SLP does not believe modified (MBSS) s appropriate at this time, as March 2021 MBSS looked  very much the same as July 2020 MBSS.                    SLP Education - 03/15/20 2204    Education Details nectar liquids safe for pt, thin liquids increase risk for aspiration which incr risk of pneumonia, s/sx aspiration PNA, swallow precautions, wife may want to try cold sour liquids due to pt cognitively based dysphagia    Person(s) Educated Patient;Spouse    Methods Explanation;Verbal cues;Handout;Demonstration    Comprehension Verbal cues required;Need further instruction            SLP Short Term Goals - 11/05/19 1155  SLP SHORT TERM GOAL #1   Title Pt will complete HEP for dysarthria with occasional min A from ST or spouse over 2 sessions    Time 4    Period Weeks    Status New      SLP SHORT TERM GOAL #2   Title Pt will sustain attention to tasks in ST for 3 minutes with occasional min A    Time 4    Period Weeks    Status New      SLP SHORT TERM GOAL #3   Title Pt will swallow nectar lquids with external compensations within 8 seconds with usual mod A    Time 4    Period Weeks    Status New            SLP Long Term Goals - 11/05/19 1158      SLP LONG TERM GOAL #1   Title Pt will attend to and participate in 3 turns of conversation with occasional min A over 2 sessions    Time 8    Period Weeks    Status New      SLP LONG TERM GOAL #2   Title Pt will be 85% intellgible at phrase and word level in quiet room with occasional min A    Time 8    Period Weeks    Status New      SLP LONG TERM GOAL #3   Title Pt will swallow liqiuds withing 5 seconds with occasional min A over 2 sessions    Time 8    Period Weeks    Status New             Patient will benefit from skilled therapeutic intervention in order to improve the following deficits and impairments:   Dysphagia, oropharyngeal phase  Dysarthria and anarthria  Cognitive communication deficit    Problem List Patient Active Problem List   Diagnosis Date Noted  .  Benign essential HTN   . Cognitive deficit, post-stroke   . Dysphagia, post-stroke   . Controlled type 2 diabetes mellitus with hyperglycemia, without long-term current use of insulin ([place])   . 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 Dyslipidemia, goal LDL below 70 03/12/2019  . Dysphagia due to recent stroke 03/12/2019  . Overweight 03/12/2019  . 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 Abnormality of gait   . Spasticity   . Gait disturbance, post-stroke   . Cerebrovascular accident (CVA) due to occlusion of vertebral artery ([place])   . Essential hypertension 01/31/2016  . 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    [facility] 03/15/2020, 10:12 PM  [facility] [address] [place], [place], [address] Phone: [phone]   Fax:  [phone]  Name: MICKEAL DAWS Sr. MRN: [id number] Date of Birth: 1950/11/24

## 2020-03-15 NOTE — Patient Instructions (Addendum)
   [employer] must have slightly thickened liquids (to look like tomato juice) if he is to keep liquids out of his lungs. This could eventually lead to pneumonia. Signs and symptoms of pneumonia are below: Signs of Aspiration Pneumonia   . Chest pain/tightness . Fever (can be low grade) . Cough  o With foul-smelling phlegm (sputum) o With sputum containing pus or blood o With greenish sputum . Fatigue  . Shortness of breath  . Wheezing   **IF YOU HAVE THESE SIGNS, CONTACT YOUR DOCTOR OR GO TO THE EMERGENCY DEPARTMENT OR URGENT CARE AS SOON AS POSSIBLE**   ================================  In order to eat safely, Joe [employer] needs to:   Have tomato-juice liquids  Eat at a slow rate  Take small bites and small sips  Eat meals without any distractions (TV, radio, conversation, children or pets in the room)

## 2020-03-22 ENCOUNTER — Ambulatory Visit (INDEPENDENT_AMBULATORY_CARE_PROVIDER_SITE_OTHER): Payer: Medicare Other

## 2020-03-22 ENCOUNTER — Ambulatory Visit (HOSPITAL_COMMUNITY)
Admission: EM | Admit: 2020-03-22 | Discharge: 2020-03-22 | Disposition: A | Discharge status: Home / Self Care | Payer: Medicare Other | Attending: Family Medicine | Admitting: Family Medicine

## 2020-03-22 ENCOUNTER — Encounter (HOSPITAL_COMMUNITY): Payer: Self-pay

## 2020-03-22 ENCOUNTER — Other Ambulatory Visit: Payer: Self-pay

## 2020-03-22 DIAGNOSIS — J811 Chronic pulmonary edema: Secondary | ICD-10-CM | POA: Diagnosis not present

## 2020-03-22 DIAGNOSIS — R600 Localized edema: Secondary | ICD-10-CM | POA: Insufficient documentation

## 2020-03-22 DIAGNOSIS — R609 Edema, unspecified: Secondary | ICD-10-CM

## 2020-03-22 DIAGNOSIS — I517 Cardiomegaly: Secondary | ICD-10-CM | POA: Insufficient documentation

## 2020-03-22 DIAGNOSIS — R6889 Other general symptoms and signs: Secondary | ICD-10-CM | POA: Diagnosis not present

## 2020-03-22 LAB — COMPREHENSIVE METABOLIC PANEL
ALT: 32 U/L (ref 0–44)
AST: 20 U/L (ref 15–41)
Albumin: 4.1 g/dL (ref 3.5–5.0)
Alkaline Phosphatase: 69 U/L (ref 38–126)
Anion gap: 7 (ref 5–15)
BUN: 13 mg/dL (ref 8–23)
CO2: 30 mmol/L (ref 22–32)
Calcium: 9.6 mg/dL (ref 8.9–10.3)
Chloride: 103 mmol/L (ref 98–111)
Creatinine, Ser: 0.82 mg/dL (ref 0.61–1.24)
GFR calc Af Amer: >60 mL/min (ref >60)
GFR calc non Af Amer: >60 mL/min (ref >60)
Glucose, Bld: 139 mg/dL — ABNORMAL HIGH (ref 70–99)
Potassium: 3.9 mmol/L (ref 3.5–5.1)
Sodium: 140 mmol/L (ref 135–145)
Total Bilirubin: 1 mg/dL (ref 0.3–1.2)
Total Protein: 7.8 g/dL (ref 6.5–8.1)

## 2020-03-22 LAB — CBC
HCT: 39.6 % (ref 39.0–52.0)
Hemoglobin: 12.9 g/dL — ABNORMAL LOW (ref 13.0–17.0)
MCH: 31.5 pg (ref 26.0–34.0)
MCHC: 32.6 g/dL (ref 30.0–36.0)
MCV: 96.6 fL (ref 80.0–100.0)
Platelets: 228 10*3/uL (ref 150–400)
RBC: 4.1 MIL/uL — ABNORMAL LOW (ref 4.22–5.81)
RDW: 12.5 % (ref 11.5–15.5)
WBC: 5.9 10*3/uL (ref 4.0–10.5)
nRBC: 0 % (ref 0.0–0.2)

## 2020-03-22 LAB — BRAIN NATRIURETIC PEPTIDE: B Natriuretic Peptide: 79.1 pg/mL (ref 0.0–100.0)

## 2020-03-22 MED ORDER — FUROSEMIDE 20 MG PO TABS: 20.0000 mg | ORAL_TABLET | Freq: Every day | ORAL | 0 refills | Status: DC

## 2020-03-22 NOTE — Discharge Instructions (Addendum)
Take the fluid pill every day Call your doctor tomorrow You need to see heart specialist Eat foods with potassium

## 2020-03-22 NOTE — ED Provider Notes (Signed)
[facility]    CSN: [id number] Arrival date & time: 03/22/20  1630      History   Chief Complaint Chief Complaint  Patient presents with  . multiple complaints    HPI Joe TAMBLYN Sr. is a 69 y.o. male.   HPI  Pleasant 69 year old gentleman who is here with his wife He has a complicated medical history including; hypertension, stroke, dysphagia, cognitive deficit, controlled type 2 diabetes, disks lipidemia, gait disturbance post stroke.  He states he does not have a history of heart disease.  He states he does not have a history of edema.  His wife concurs with this assessment. He is here with 2 problems.  First he has some redness in his right eye.  No visual defect.  No trauma.  No drainage.  No photophobia His second problem is pedal edema.  He states that its been getting worse over the last couple of weeks.  At times it is uncomfortable for him.  Has not noticed any change in his weight.  Has not noticed any abdominal distention, or shortness of breath.  He is in active, walking slowly with a walker since his stroke.  He has not had any chest pain, palpitations, lightheadedness or dizziness No change in medications No change in diet No change in bowel habits or urination  Past Medical History:  Diagnosis Date  . Diabetes mellitus without complication ([place])   . Dysphagia    post stroke  . Hypertension   . Stroke [facility])     Patient Active Problem List   Diagnosis Date Noted  . Benign essential HTN   . Cognitive deficit, post-stroke   . Dysphagia, post-stroke   . Controlled type 2 diabetes mellitus with hyperglycemia, without long-term current use of insulin ([place])   . 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 ([place]) 06/03/2019  . 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 Dyslipidemia, goal LDL below 70 03/12/2019  . Dysphagia due to recent  stroke 03/12/2019  . Overweight 03/12/2019  . 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 Abnormality of gait   . Spasticity   . Gait disturbance, post-stroke   . Cerebrovascular accident (CVA) due to occlusion of vertebral artery ([place])   . Essential hypertension 01/31/2016  . 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    Past Surgical History:  Procedure Laterality Date  . IR ANGIO INTRA EXTRACRAN SEL COM CAROTID INNOMINATE UNI L MOD SED  03/10/2019  . IR ANGIO VERTEBRAL SEL SUBCLAVIAN INNOMINATE BILAT MOD SED  03/10/2019  . IR CT HEAD LTD  03/10/2019  . IR PERCUTANEOUS ART THROMBECTOMY/INFUSION INTRACRANIAL INC DIAG ANGIO  03/10/2019  . RADIOLOGY WITH ANESTHESIA N/A 03/10/2019   Procedure: IR WITH ANESTHESIA/CODE STROKE;  Surgeon: Radiologist, Medication, MD;  Location: [facility];  Service: Radiology;  Laterality: N/A;       Home Medications    Prior to Admission medications   Medication Sig Start Date End Date Taking? Authorizing Provider  acetaminophen (TYLENOL) 325 MG tablet Take 2 tablets (650 mg total) by mouth every 4 (four) hours as needed for mild pain (or temp > 37.5 C (99.5 F)). 06/19/19   Angiulli, Lavon Paganini, PA-C  amLODipine (NORVASC) 5 MG tablet Take 1 tablet (5 mg total) by mouth daily. To lower blood pressure 01/14/20   Antony Blackbird, MD  aspirin  EC 81 MG tablet Take 1 tablet (81 mg total) by mouth daily. 09/08/13   Reyne Dumas, MD  atorvastatin (LIPITOR) 40 MG tablet One daily in the evening to lower cholesterol 01/14/20   Fulp, Cammie, MD  donepezil (ARICEPT) 10 MG tablet Take 1 tablet (10 mg total) by mouth at bedtime. 09/03/19   Elsie Stain, MD  furosemide (LASIX) 20 MG tablet Take 1 tablet (20 mg total) by mouth daily. 03/22/20   Raylene Everts, MD  QUEtiapine (SEROQUEL) 25 MG tablet Take 2 tablets (50 mg total) by mouth at bedtime. Patient taking differently: Take 50 mg by mouth  at bedtime. Wife reports that pt is taking 1 pill, 2x/day 09/03/19   Elsie Stain, MD    Family History Family History  Problem Relation Age of Onset  . Diabetes Other   . Hypertension Other   . Healthy Mother   . Healthy Father     Social History Social History   Tobacco Use  . Smoking status: Never Smoker  . Smokeless tobacco: Never Used  Vaping Use  . Vaping Use: Never used  Substance Use Topics  . Alcohol use: No  . Drug use: No     Allergies   Patient has no known allergies.   Review of Systems Review of Systems  See HPI Physical Exam Triage Vital Signs ED Triage Vitals  Enc Vitals Group     BP 03/22/20 1749 (!) 154/81     Pulse Rate 03/22/20 1749 65     Resp 03/22/20 1749 16     Temp 03/22/20 1749 97.9 F (36.6 C)     Temp Source 03/22/20 1749 Oral     SpO2 03/22/20 1749 97 %     Weight 03/22/20 1750 165 lb (74.8 kg)     Height 03/22/20 1750 6' (1.829 m)     Head Circumference --      Peak Flow --      Pain Score 03/22/20 1750 0     Pain Loc --      Pain Edu? --      Excl. in [place]? --    No data found.  Updated Vital Signs BP (!) 154/81   Pulse 65   Temp 97.9 F (36.6 C) (Oral)   Resp 16   Ht 6' (1.829 m)   Wt 74.8 kg   SpO2 97%   BMI 22.38 kg/m       Physical Exam Constitutional:      General: He is not in acute distress.    Appearance: He is well-developed. He is not ill-appearing.     Comments: Some hesitation in answering questions.  Mostly appropriate and lucid.  Lean in appearance.  HENT:     Head: Normocephalic and atraumatic.     Mouth/Throat:     Comments: Mask is in place Eyes:     Conjunctiva/sclera: Conjunctivae normal.     Pupils: Pupils are equal, round, and reactive to light.  Cardiovascular:     Rate and Rhythm: Normal rate and regular rhythm.     Pulses: Normal pulses.     Heart sounds: Normal heart sounds.  Pulmonary:     Effort: Pulmonary effort is normal. No respiratory distress.     Breath sounds:  Rhonchi and rales present.  Abdominal:     General: Abdomen is flat. Bowel sounds are normal. There is no distension.     Palpations: Abdomen is soft.  Musculoskeletal:  General: Normal range of motion.     Cervical back: Normal range of motion and neck supple.     Right lower leg: Edema present.     Left lower leg: Edema present.     Comments: 2+ edema to mention  Skin:    General: Skin is warm and dry.  Neurological:     Mental Status: He is alert.     Comments: Shaky hand movements  Psychiatric:        Mood and Affect: Mood normal.        Behavior: Behavior normal.      UC Treatments / Results  Labs (all labs ordered are listed, but only abnormal results are displayed) Labs Reviewed  CBC - Abnormal; Notable for the following components:      Result Value   RBC 4.10 (*)    Hemoglobin 12.9 (*)    All other components within normal limits  COMPREHENSIVE METABOLIC PANEL - Abnormal; Notable for the following components:   Glucose, Bld 139 (*)    All other components within normal limits  BRAIN NATRIURETIC PEPTIDE    EKG-unchanged from prior.  LVH.  Normal sinus rhythm.  No ST or T wave changes  Radiology DG Chest 2 View  Result Date: 03/22/2020 CLINICAL DATA:  Pedal edema. EXAM: CHEST - 2 VIEW COMPARISON:  10/15/2019 FINDINGS: The heart size is enlarged. There is vascular congestion without overt pulmonary edema. Atherosclerotic changes are noted of the thoracic aorta. There is no pneumothorax. No large pleural effusion. IMPRESSION: Cardiomegaly with mild vascular congestion. Electronically Signed   By: Constance Holster M.D.   On: 03/22/2020 19:28    Procedures Procedures (including critical care time)  Medications Ordered in UC Medications - No data to display  Initial Impression / Assessment and Plan / UC Course  I have reviewed the triage vital signs and the nursing notes.  Pertinent labs & imaging results that were available during my care of the  patient were reviewed by me and considered in my medical decision making (see chart for details).     More than once during our visit, the weight was about 2 hours, the patient got up and tried to leave.  He needed to be encouraged to stay.  I did tell the patient and his family that it appears he has no enlarged heart and will need to follow-up with cardiology.  He needs to call his primary care doctor tomorrow for follow-up care And when to give him a limited number of Lasix.  10 only.  He is encouraged to have potassium foods. We will check electrolytes tonight.  We will call them tomorrow with her lab test results. Final Clinical Impressions(s) / UC Diagnoses   Final diagnoses:  Cardiomegaly  LVH (left ventricular hypertrophy)  Pedal edema     Discharge Instructions     Take the fluid pill every day Call your doctor tomorrow You need to see heart specialist Eat foods with potassium   ED Prescriptions    Medication Sig Dispense Auth. Provider   furosemide (LASIX) 20 MG tablet Take 1 tablet (20 mg total) by mouth daily. 10 tablet Raylene Everts, MD     PDMP not reviewed this encounter.   Raylene Everts, MD 03/22/20 2119

## 2020-03-22 NOTE — ED Triage Notes (Signed)
Per wife, pt has swollen legs bilat started yesterday. Pt has red right eye and blurred vision in that yesterday. Pt has 2+ edema of feet bilat and ankles bilat. Pt has 1+ pedal pulse biat.

## 2020-03-24 ENCOUNTER — Ambulatory Visit: Payer: Medicare Other

## 2020-03-24 ENCOUNTER — Other Ambulatory Visit: Payer: Self-pay

## 2020-03-24 DIAGNOSIS — R1312 Dysphagia, oropharyngeal phase: Secondary | ICD-10-CM

## 2020-03-24 DIAGNOSIS — R41841 Cognitive communication deficit: Secondary | ICD-10-CM

## 2020-03-24 DIAGNOSIS — R471 Dysarthria and anarthria: Secondary | ICD-10-CM

## 2020-03-24 NOTE — Patient Instructions (Signed)
Provide drinks he enjoys with nectar liquids and hopefully he will take them. He has risk of aspiration if liquids are thin.  Blue brochure re: advance directives provided to wife and she was encouraged to call clinical social work departmetn at [facility].

## 2020-03-24 NOTE — Therapy (Signed)
[facility] [address] [place], [place], [address] Phone: [phone]   Fax:  [phone]  Speech Language Pathology Treatment  Patient Details  Name: Joe SOLLENBERGER Sr. MRN: [id number] Date of Birth: [birth year] Referring Provider (SLP): Cammie Fulp   Encounter Date: 03/24/2020   End of Session - 03/24/20 1217    Visit Number 2    Number of Visits 17    Date for SLP Re-Evaluation 06/11/20   90 days        SLP Start Time 0935    SLP Stop Time  9937    SLP Time Calculation (min) 40 min    Activity Tolerance Patient limited by lethargy;Patient limited by fatigue           Past Medical History:  Diagnosis Date  . Diabetes mellitus without complication ([place])   . Dysphagia    post stroke  . Hypertension   . Stroke [facility])     Past Surgical History:  Procedure Laterality Date  . IR ANGIO INTRA EXTRACRAN SEL COM CAROTID INNOMINATE UNI L MOD SED  03/10/2019  . IR ANGIO VERTEBRAL SEL SUBCLAVIAN INNOMINATE BILAT MOD SED  03/10/2019  . IR CT HEAD LTD  03/10/2019  . IR PERCUTANEOUS ART THROMBECTOMY/INFUSION INTRACRANIAL INC DIAG ANGIO  03/10/2019  . RADIOLOGY WITH ANESTHESIA N/A 03/10/2019   Procedure: IR WITH ANESTHESIA/CODE STROKE;  Surgeon: Radiologist, Medication, MD;  Location: [facility];  Service: Radiology;  Laterality: N/A;    There were no vitals filed for this visit.   Subjective Assessment - 03/24/20 0946    Subjective Pt unintelligible with African dialect and his dysarthria. Spontaneously, pt spoke slower with request to repeat, and SLP with improved comprehension.    Patient is accompained by: Family member   wife   Currently in Pain? No/denies                 ADULT SLP TREATMENT - 03/24/20 0947      General Information   Behavior/Cognition Distractible;Other (comment);Decreased sustained attention;Requires cueing;Lethargic   usually somonolent-SLP waked pt      Treatment Provided   Treatment  provided Dysphagia      Dysphagia Treatment   Temperature Spikes Noted No    Respiratory Status Room air    Treatment Methods Patient/caregiver education    Patient observed directly with PO's No    Other treatment/comments Pt presents differently today than during evaluation last week. SLP woke pt multiple times during session today. "He didn't sleep last night," per wife. In between waking pt, SLP attempted to describe to pt (and wife) risk associated with pt ingesting thin liquids - as wife states pt will not swallow nectar water as she attempted after previous MBSS - "he took them for 3-4 days and then refused any water that was thick." SLP suggested wife try liquids pt enjoys with nectar-thick. Wife stated she would attempt thickening juice to nectar and see if pt will take it consistently. SLP ascertained as best as possible (and wife presented documentation) that she has POA, but no documentation for Healthcare POA. SLP provided information (blue brochure) re: explanation of advance directives and explained in detail a Healthcare POA. Wife then expressed to SLP that the caregiver role is wearing on her emotionally and physically. SLP suggested when pt wife calls Clinical Social Work dept about medical POA that she ask about respite care as well as possibility for pt to attend day programs such as [facility].  Assessment / Recommendations / Plan   Plan Other (Comment)   ? d/c goals inappropriate for pt cognitive status     Dysphagia Recommendations   Diet recommendations Regular;Nectar-thick liquid    Liquids provided via --   as tolerated   Medication Administration --   as tolerated   Supervision Full supervision/cueing for compensatory strategies    Compensations Slow rate;Small sips/bites;Multiple dry swallows after each bite/sip   others as deliniated on MBSS March 2021     Progression Toward Goals   Progression toward goals Not progressing toward goals (comment)    lethargy/somnolence/cognition           SLP Education - 03/24/20 1216    Education Details medical POA/advance directives    Person(s) Educated Patient;Spouse    Methods Explanation;Handout    Comprehension Verbalized understanding;Need further instruction            SLP Short Term Goals - 03/24/20 1229      SLP SHORT TERM GOAL #1   Title Pt will swallow nectar liquids with external compensations within 8 seconds with usual mod A    Time 4    Period Weeks    Status On-going      SLP SHORT TERM GOAL #2   Title pt wife will demo safe feeding and cueing of patient during POs in 2 sessions    Time 4    Period Weeks    Status On-going      SLP SHORT TERM GOAL #3   Title in 2 sessions, pt wife will tell SLP how aspiration PNA occurs, so she can notify MD ASAP if she suspects pt with PNA    Time 4    Period Weeks    Status On-going            SLP Long Term Goals - 03/24/20 1229      SLP LONG TERM GOAL #1   Title in 4 sessions, pt wife will tell SLP how aspiration PNA occurs, so she can notify MD ASAP if she suspects pt with PNA    Time 6    Period Weeks   or 13 total sessions   Status On-going      SLP LONG TERM GOAL #2   Title Pt will swallow liqiuds within 6 seconds with occasional min A over 2 sessions    Time 6    Period Weeks    Status On-going      SLP LONG TERM GOAL #3   Title pt wife will demo safe feeding and cueing of patient during POs in 4 sessions    Time 6    Period Weeks    Status On-going      SLP LONG TERM GOAL #4   Title pt will undergo furhter testing of language or cognition PRN    Time 5    Period Weeks    Status On-going            Plan - 03/24/20 1218    Clinical Impression Statement 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 Today he is considerably less interactive than during evaluation last week. Wife states if pt does not sleep during the night he is often somnolent during the day. Wife reports pt  hold times have been a little less as she has used grapefruit juice and unsweetened cranberry juice. However continues to hold his meds in his hand or in his mouth, despite her verbal cues to take them. SLP  cont to believe there is a cognitive-behavioral component to the dysphagia, and cognition may be pt's primary component.  Joe Perry cont to present with moderate to severe cognitive linguistic impairments, and dysarthria. Today pt opened his eyes when SLP called his name however req'd consistent verbal and tactile cues to remain awake. See "other comments" for more details. HE req'd verbal and visual cues to swallow secretions when speaking (did not do so spontaneously). Pt volume was decreased moreso today than at eval - pt did not incr volume when asked to do so. I recommend cont'd skilled ST mainly for caregiver education, to educate Mrs. Niznik how to assist the patient at mealtime to incr safety during POs and minimize risk of aspiration and to promote pulmonary health. Goals may be modified to more definitively target pt's safety via caregiver education. Furrther testing may be done later to formally assess cognition or langugae skills if pt/wife desire.    Speech Therapy Frequency 2x / week    Duration --   8 weeks or 17 visits (pt self pay - may modify as needed)   Treatment/Interventions Aspiration precaution training;Language facilitation;Environmental controls;Cueing hierarchy;Oral motor exercises;SLP instruction and feedback;Compensatory strategies;Functional tasks;Cognitive reorganization;Compensatory techniques;Diet toleration management by SLP;Trials of upgraded texture/liquids;Internal/external aids;Multimodal communcation approach;Patient/family education    Potential to Achieve Goals Fair    Potential Considerations Cooperation/participation level;Severity of impairments;Previous level of function    Consulted and Agree with Plan of Care Family member/caregiver    Family Member Consulted  spouse           Patient will benefit from skilled therapeutic intervention in order to improve the following deficits and impairments:   Dysphagia, oropharyngeal phase  Dysarthria and anarthria  Cognitive communication deficit    Problem List Patient Active Problem List   Diagnosis Date Noted  . Benign essential HTN   . Cognitive deficit, post-stroke   . Dysphagia, post-stroke   . Controlled type 2 diabetes mellitus with hyperglycemia, without long-term current use of insulin ([place])   . 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 ([place]) 06/03/2019  . 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 Dyslipidemia, goal LDL below 70 03/12/2019  . Dysphagia due to recent stroke 03/12/2019  . Overweight 03/12/2019  . 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 Abnormality of gait   . Spasticity   . Gait disturbance, post-stroke   . Cerebrovascular accident (CVA) due to occlusion of vertebral artery ([place])   . Essential hypertension 01/31/2016  . 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 MRN: [id number] Date of Birth: [birth year]

## 2020-03-26 ENCOUNTER — Other Ambulatory Visit: Payer: Self-pay

## 2020-03-26 ENCOUNTER — Ambulatory Visit: Payer: Medicare Other

## 2020-03-26 DIAGNOSIS — R471 Dysarthria and anarthria: Secondary | ICD-10-CM | POA: Diagnosis not present

## 2020-03-26 DIAGNOSIS — R41841 Cognitive communication deficit: Secondary | ICD-10-CM

## 2020-03-26 DIAGNOSIS — R1312 Dysphagia, oropharyngeal phase: Secondary | ICD-10-CM

## 2020-03-26 NOTE — Therapy (Signed)
[facility] [address] [place], [place], [address] Phone: [phone]   Fax:  [phone]  Speech Language Pathology Treatment  Patient Details  Name: Joe CICHY Sr. MRN: [id number] Date of Birth: [birth year] Referring Provider (SLP): Cammie Fulp   Encounter Date: 03/26/2020   End of Session - 03/26/20 1636    Visit Number 3    Number of Visits 17    Date for SLP Re-Evaluation 06/11/20   90 days   Authorization Type self pay at this time    SLP Start Time 0804    SLP Stop Time  0845    SLP Time Calculation (min) 41 min    Activity Tolerance Patient limited by lethargy;Patient limited by fatigue           Past Medical History:  Diagnosis Date  . Diabetes mellitus without complication ([place])   . Dysphagia    post stroke  . Hypertension   . Stroke [facility])     Past Surgical History:  Procedure Laterality Date  . IR ANGIO INTRA EXTRACRAN SEL COM CAROTID INNOMINATE UNI L MOD SED  03/10/2019  . IR ANGIO VERTEBRAL SEL SUBCLAVIAN INNOMINATE BILAT MOD SED  03/10/2019  . IR CT HEAD LTD  03/10/2019  . IR PERCUTANEOUS ART THROMBECTOMY/INFUSION INTRACRANIAL INC DIAG ANGIO  03/10/2019  . RADIOLOGY WITH ANESTHESIA N/A 03/10/2019   Procedure: IR WITH ANESTHESIA/CODE STROKE;  Surgeon: Radiologist, Medication, MD;  Location: [facility];  Service: Radiology;  Laterality: N/A;    There were no vitals filed for this visit.   Subjective Assessment - 03/26/20 0846    Subjective "He had one sip (of the nectar juices) and pushed them away. He did not drink. I will keep trying."    Patient is accompained by: Family member   wife   Currently in Pain? No/denies                 ADULT SLP TREATMENT - 03/26/20 0812      General Information   Behavior/Cognition Distractible;Lethargic;Cooperative      Treatment Provided   Treatment provided Dysphagia      Dysphagia Treatment   Temperature Spikes Noted No    Respiratory Status  Room air    Treatment Methods Patient/caregiver education    Patient observed directly with PO's No    Other treatment/comments Pt arrives with oral secretions, unswallowed - SLP cued pt to swallow and pt did so with ~6 second delay. His voice appeared clear after this. Wife reports pt slept well last night, however pt very somnolent during session today, requiring SLP and wife auditory andn occasionally tactile cues to wake pt throughout session. Since pt did not take nectar liquids wife continues to provide water as pt will drink this; he also drinks hot tea. No overt s/sx aspiration PNA today nor any reported to SLP from pt's wife. With simple yes/no and "wh" ?s pt answered in confabulation. example: Pt answered he ate corn peas beans for breakfast this morning. Wife laughed and corrected pt that he ate [place] bacon sandwich and hot tea. Regarding wife inquiring about medical POA and asking about suggestions for respite care for pt: she stated she was on hold for 3 hours with [facility] [place] [facility] department and finally hung up phone. Wife plans to visit clincial social work dept on Monday after ST session. Wife to bring food from home on Monday so SLP can assist wife feeding pt and offer skilled advice on how to  decr pt's risk of aspiration/decr'd pulmonary health.       Assessment / Recommendations / Plan   Plan Other (Comment);Goals updated   target wife's education how to keep pt safe at meals     Dysphagia Recommendations   Diet recommendations Regular;Nectar-thick liquid    Liquids provided via --   as tolerated   Medication Administration --   as tolerated   Supervision Full supervision/cueing for compensatory strategies    Compensations Slow rate;Small sips/bites;Multiple dry swallows after each bite/sip   and others as on MBSS from March 2021     Progression Toward Goals   Progression toward goals Not progressing toward goals (comment)   somnolence, severity of deficits, decr'd attention              SLP Short Term Goals - 03/26/20 0816      SLP SHORT TERM GOAL #1   Title Pt will swallow nectar liquids with external compensations within 8 seconds with usual mod A    Time --    Period --    Status Deferred   due to pt somnolence/decr'd attention and awareness     SLP SHORT TERM GOAL #2   Title pt wife will demo safe feeding and cueing of patient during POs in 2 sessions    Time 4    Period Weeks    Status On-going      SLP SHORT TERM GOAL #3   Title in 2 sessions, pt wife will tell SLP how aspiration PNA occurs, so she can notify MD ASAP if she suspects pt with PNA    Time 4    Period Weeks    Status On-going            SLP Long Term Goals - 03/26/20 1643      SLP LONG TERM GOAL #1   Title in 4 sessions, pt wife will tell SLP how aspiration PNA occurs, so she can notify MD ASAP if she suspects pt with PNA    Time 6    Period Weeks   or 13 total sessions   Status On-going      SLP LONG TERM GOAL #2   Title Pt will swallow liqiuds within 6 seconds with occasional min A over 2 sessions    Period Weeks    Status Deferred   due to pt somnolence/decr'd attention and awareness     SLP LONG TERM GOAL #3   Title pt wife will demo safe feeding and cueing of patient during POs in 4 sessions    Time 6    Period Weeks    Status On-going      SLP LONG TERM GOAL #4   Title pt will undergo furhter testing of language or cognition PRN    Time 5    Period Weeks    Status On-going            Plan - 03/26/20 1636    Clinical Impression Statement 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 Today he remains considerably less interactive than during evaluation last week. Wife stated pt had a good night's sleep last night. Wife reports pt takes a bite of food on his spoon or fork and wife has to cue him to put bite into his mouth and this results in 1-2 ohurs of mealtime. She will often feed pt and then mealtime is decr'd to 30-45 minutes.  SLP needs to observe pt wife feeding patient to provide  skilled feedback on what wife can do to duirng mealtime to make pt as safe as possible. SLP cont to believe there is cognitive-behavioral basis to the pt's dysphagia. Given that pt has been largely somnolent for the last two sessions, goal for pt to swallow liquid with a certain number of seconds is deferred and tx will focus on pt's wife education to make pt as safe as possible during meals - pt's wife was asked x3 to bring in food from home next session for this purpose. Mr. Brandenburg cont to present with moderate to severe cognitive linguistic impairments, and dysarthria, worsened by somnolence to severe cognitive deficits.  See "other treatment/comments" for more details. Pt frequency of ST will likely be modified/decreased next session if somnolence does not improve . Further testing for cognition will likely not take place due to level of pt somnolence.    Speech Therapy Frequency 2x / week    Duration --   8 weeks or 17 visits (pt self pay - may modify as needed)   Treatment/Interventions Aspiration precaution training;Language facilitation;Environmental controls;Cueing hierarchy;Oral motor exercises;SLP instruction and feedback;Compensatory strategies;Functional tasks;Cognitive reorganization;Compensatory techniques;Diet toleration management by SLP;Trials of upgraded texture/liquids;Internal/external aids;Multimodal communcation approach;Patient/family education    Potential to Achieve Goals Fair    Potential Considerations Cooperation/participation level;Severity of impairments;Previous level of function    Consulted and Agree with Plan of Care Family member/caregiver    Family Member Consulted spouse           Patient will benefit from skilled therapeutic intervention in order to improve the following deficits and impairments:   Dysphagia, oropharyngeal phase  Dysarthria and anarthria  Cognitive communication deficit    Problem  List Patient Active Problem List   Diagnosis Date Noted  . Benign essential HTN   . Cognitive deficit, post-stroke   . Dysphagia, post-stroke   . Controlled type 2 diabetes mellitus with hyperglycemia, without long-term current use of insulin ([place])   . 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 ([place]) 06/03/2019  . 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 Dyslipidemia, goal LDL below 70 03/12/2019  . Dysphagia due to recent stroke 03/12/2019  . Overweight 03/12/2019  . 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 Abnormality of gait   . Spasticity   . Gait disturbance, post-stroke   . Cerebrovascular accident (CVA) due to occlusion of vertebral artery ([place])   . Essential hypertension 01/31/2016  . 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    [facility] ,MS, CCC-SLP  03/26/2020, 4:44 PM  [facility] [address] [place], [place], [address] Phone: [phone]   Fax:  [phone]   Name: Joe MINASYAN Sr. MRN: [id number] Date of Birth: February 16, 1951

## 2020-03-26 NOTE — Patient Instructions (Signed)
We will work with you how to keep Joe Perry safe when he eats.  Please bring something in from home for him to eat next session.

## 2020-03-29 ENCOUNTER — Other Ambulatory Visit: Payer: Self-pay

## 2020-03-29 ENCOUNTER — Encounter: Payer: Self-pay | Admitting: Speech Pathology

## 2020-03-29 ENCOUNTER — Ambulatory Visit: Payer: Medicare Other | Admitting: Speech Pathology

## 2020-03-29 DIAGNOSIS — R1312 Dysphagia, oropharyngeal phase: Secondary | ICD-10-CM | POA: Diagnosis not present

## 2020-03-29 DIAGNOSIS — R471 Dysarthria and anarthria: Secondary | ICD-10-CM | POA: Diagnosis not present

## 2020-03-29 NOTE — Therapy (Signed)
[facility] [address] [place], [place], [address] Phone: [phone]   Fax:  [phone]  Speech Language Pathology Treatment  Patient Details  Name: Joe OGUINN Sr. MRN: [id number] Date of Birth: 09/06/1950 Referring Provider (SLP): Cammie Fulp   Encounter Date: 03/29/2020   End of Session - 03/29/20 1211    Visit Number 4    Number of Visits 17    Date for SLP Re-Evaluation 06/11/20    Authorization Type self pay at this time    SLP Start Time 0845    SLP Stop Time  0918   pt left early to use restroom   SLP Time Calculation (min) 33 min    Activity Tolerance Patient tolerated treatment well           Past Medical History:  Diagnosis Date  . Diabetes mellitus without complication ([place])   . Dysphagia    post stroke  . Hypertension   . Stroke [facility])     Past Surgical History:  Procedure Laterality Date  . IR ANGIO INTRA EXTRACRAN SEL COM CAROTID INNOMINATE UNI L MOD SED  03/10/2019  . IR ANGIO VERTEBRAL SEL SUBCLAVIAN INNOMINATE BILAT MOD SED  03/10/2019  . IR CT HEAD LTD  03/10/2019  . IR PERCUTANEOUS ART THROMBECTOMY/INFUSION INTRACRANIAL INC DIAG ANGIO  03/10/2019  . RADIOLOGY WITH ANESTHESIA N/A 03/10/2019   Procedure: IR WITH ANESTHESIA/CODE STROKE;  Surgeon: Radiologist, Medication, MD;  Location: [facility];  Service: Radiology;  Laterality: N/A;    There were no vitals filed for this visit.   Subjective Assessment - 03/29/20 0906    Subjective "He has been awake since we stopped the night time medication"    Currently in Pain? No/denies                 ADULT SLP TREATMENT - 03/29/20 1159      General Information   Behavior/Cognition Alert;Cooperative;Requires cueing      Treatment Provided   Treatment provided Dysphagia      Dysphagia Treatment   Temperature Spikes Noted No    Respiratory Status Room air    Oral Cavity - Dentition Poor condition;Missing dentition    Treatment Methods  Skilled observation;Patient/caregiver education    Patient observed directly with PO's Yes    Type of PO's observed Regular;Thin liquids    Feeding Able to feed self    Liquids provided via Cup    Type of cueing Verbal    Amount of cueing Minimal    Other treatment/comments Wife brought in meat sandwich with onions, lettuce tomato and warm tea. Pt self fed sandwich with rare min A and took liquids with rare min A. Wife interrupted pt with extraneous instructions to pray before eating, wipe his mouth, take sips. Instructed her that as long as he is eating in a timely and  focused manner, her instructions may cause confusion and slow his processing of eating. When pt is eating timely and independenlty, reduce verbal instructions. Allow him to drink after he eats. Wife is focused on hydration. She gives him a bottle of water every am prior to oral care. Educated her on oral care prior to thin liquids (which he is aspirating) to reduced bacteria that may enter his lungs. Ongoing rationale and education for oral care before PO, especially thin liquids. Wife put meds in pt's hand. Extended time to process what to do with meds. Wife says "he will look at them in his hand for 1  hour." Today, simple verbal cue with modeling "put the meds in your mouth" and I gestured this. Pt took meds - he required 3 sips to clear meds from his mouth.       Assessment / Recommendations / Plan   Plan Continue with current plan of care      Dysphagia Recommendations   Diet recommendations Regular;Nectar-thick liquid   pt refuses nectar, oral care prior to thin liquids   Liquids provided via Cup    Medication Administration Whole meds with liquid    Supervision Full supervision/cueing for compensatory strategies    Compensations Slow rate;Small sips/bites;Multiple dry swallows after each bite/sip    Postural Changes and/or Swallow Maneuvers Out of bed for meals;Seated upright 90 degrees      Progression Toward Goals    Progression toward goals Progressing toward goals            SLP Education - 03/29/20 1206    Education Details reduce verbal instructions with meals when pt is eating well and timely    Person(s) Educated Patient;Spouse    Methods Explanation;Demonstration;Verbal cues;Handout    Comprehension Need further instruction;Verbal cues required            SLP Short Term Goals - 03/29/20 1210      SLP SHORT TERM GOAL #1   Title Pt will swallow nectar liquids with external compensations within 8 seconds with usual mod A    Status Deferred   due to pt somnolence/decr'd attention and awareness     SLP SHORT TERM GOAL #2   Title pt wife will demo safe feeding and cueing of patient during POs in 2 sessions    Time 3    Period Weeks    Status On-going      SLP SHORT TERM GOAL #3   Title in 2 sessions, pt wife will tell SLP how aspiration PNA occurs, so she can notify MD ASAP if she suspects pt with PNA    Time 3    Period Weeks    Status On-going            SLP Long Term Goals - 03/29/20 1211      SLP LONG TERM GOAL #1   Title in 4 sessions, pt wife will tell SLP how aspiration PNA occurs, so she can notify MD ASAP if she suspects pt with PNA    Time 5    Period Weeks   or 13 total sessions   Status On-going      SLP LONG TERM GOAL #2   Title Pt will swallow liqiuds within 6 seconds with occasional min A over 2 sessions    Period Weeks    Status Deferred   due to pt somnolence/decr'd attention and awareness     SLP LONG TERM GOAL #3   Title pt wife will demo safe feeding and cueing of patient during POs in 4 sessions    Time 5    Period Weeks    Status On-going      SLP LONG TERM GOAL #4   Title pt will undergo furhter testing of language or cognition PRN    Time 4    Period Weeks    Status On-going            Plan - 03/29/20 1207    Clinical Impression Statement Joe Perry continues to present with oropharyngeal dysphagia. Timeliness of oral phase and swallow  initiation correlate with level of alertness. Pt inconsistent wtih progress due  to somolance. Today, pt alert and ate a meal in less than 35 minutes with rare min verbal cues. Verbal cues from spouse resulted in slowed eating due to processing of her instructions. Ongoing training/education for spouse to maximize nutrition/hydration and safety of swallow with varying levels of alertness.    Speech Therapy Frequency 2x / week    Duration --   8 weeks or 17 visits   Treatment/Interventions Aspiration precaution training;Language facilitation;Environmental controls;Cueing hierarchy;Oral motor exercises;SLP instruction and feedback;Compensatory strategies;Functional tasks;Cognitive reorganization;Compensatory techniques;Diet toleration management by SLP;Trials of upgraded texture/liquids;Internal/external aids;Multimodal communcation approach;Patient/family education    Potential to Achieve Goals Fair    Potential Considerations Cooperation/participation level;Severity of impairments;Previous level of function           Patient will benefit from skilled therapeutic intervention in order to improve the following deficits and impairments:   Dysphagia, oropharyngeal phase    Problem List Patient Active Problem List   Diagnosis Date Noted  . Benign essential HTN   . Cognitive deficit, post-stroke   . Dysphagia, post-stroke   . Controlled type 2 diabetes mellitus with hyperglycemia, without long-term current use of insulin ([place])   . 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 ([place]) 06/03/2019  . 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 Dyslipidemia, goal LDL below 70 03/12/2019  . Dysphagia due to recent stroke 03/12/2019  . Overweight 03/12/2019  . 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 Occlusion of right posterior communicating  artery 03/10/2019  . Abnormality of gait   . Spasticity   . Gait disturbance, post-stroke   . Cerebrovascular accident (CVA) due to occlusion of vertebral artery ([place])   . Essential hypertension 01/31/2016  . 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    Joe Perry, Joe Perry, Joe Perry 03/29/2020, 12:13 PM  [facility] [address] [place], [place], [address] Phone: [phone]   Fax:  [phone]   Name: Joe KNAPPENBERGER Sr. MRN: [id number] Date of Birth: 1950/12/31

## 2020-03-29 NOTE — Patient Instructions (Signed)
° °  Since we know that the thin liquids are going down the wrong pipe, it is best to take them with a clean mouth to reduce the bacteria that could enter his lungs from aspiration  In the morning, if possible, give him his water after he has brushed his teeth  Self feeding is the safest way to eat if possible  If he is eating by himself in a timely manner, consider limiting instructions or cues to allow him to focus on getting food in. Directions to wipe or take a sip may distract him from eating and reduce amount he takes in.  Keep instructions short and just a few

## 2020-03-30 ENCOUNTER — Telehealth: Payer: Self-pay | Admitting: Primary Care

## 2020-03-30 NOTE — Telephone Encounter (Signed)
1) Medication(s) Requested (by name): QUEtiapine (SEROQUEL) 25 MG tablet   2) Pharmacy of Choice: [facility] ([place]), [place] - [address]  3) Special Requests:   Approved medications will be sent to the pharmacy, we will reach out if there is an issue.  Requests made after 3pm may not be addressed until the following business day!  If a patient is unsure of the name of the medication(s) please note and ask patient to call back when they are able to provide all info, do not send to responsible party until all information is available!

## 2020-03-30 NOTE — Telephone Encounter (Signed)
Will forward request to patient's PCP

## 2020-03-31 ENCOUNTER — Other Ambulatory Visit: Payer: Self-pay

## 2020-03-31 ENCOUNTER — Ambulatory Visit: Payer: Medicare Other

## 2020-03-31 DIAGNOSIS — R471 Dysarthria and anarthria: Secondary | ICD-10-CM

## 2020-03-31 DIAGNOSIS — R1312 Dysphagia, oropharyngeal phase: Secondary | ICD-10-CM

## 2020-03-31 DIAGNOSIS — R41841 Cognitive communication deficit: Secondary | ICD-10-CM

## 2020-03-31 NOTE — Therapy (Signed)
[facility] [address] [place], [place], [address] Phone: [phone]   Fax:  [phone]  Speech Language Pathology Treatment  Patient Details  Name: Joe CREGG Sr. MRN: [id number] Date of Birth: May 15, 1951 Referring Provider (SLP): Cammie Fulp   Encounter Date: 03/31/2020   End of Session - 03/31/20 1151    Visit Number 5    Number of Visits 17    Date for SLP Re-Evaluation 06/11/20    SLP Start Time 0935    SLP Stop Time  2671    SLP Time Calculation (min) 40 min    Activity Tolerance Patient limited by fatigue           Past Medical History:  Diagnosis Date   Diabetes mellitus without complication ([place])    Dysphagia    post stroke   Hypertension    Stroke [facility])     Past Surgical History:  Procedure Laterality Date   IR ANGIO INTRA EXTRACRAN SEL COM CAROTID INNOMINATE UNI L MOD SED  03/10/2019   IR ANGIO VERTEBRAL SEL SUBCLAVIAN INNOMINATE BILAT MOD SED  03/10/2019   IR CT HEAD LTD  03/10/2019   IR PERCUTANEOUS ART THROMBECTOMY/INFUSION INTRACRANIAL INC DIAG ANGIO  03/10/2019   RADIOLOGY WITH ANESTHESIA N/A 03/10/2019   Procedure: IR WITH ANESTHESIA/CODE STROKE;  Surgeon: Radiologist, Medication, MD;  Location: [facility];  Service: Radiology;  Laterality: N/A;    There were no vitals filed for this visit.   Subjective Assessment - 03/31/20 0947    Subjective "He is sleepy this morning."    Patient is accompained by: Family member   wife   Currently in Pain? No/denies                 ADULT SLP TREATMENT - 03/31/20 0948      General Information   Behavior/Cognition Cooperative;Requires cueing;Lethargic   somnolent     Treatment Provided   Treatment provided Dysphagia      Dysphagia Treatment   Temperature Spikes Noted No    Respiratory Status Room air    Patient observed directly with PO's Yes    Type of PO's observed Dysphagia 1 (puree);Thin liquids    Feeding Other (Comment)    total assist or able to feed self depending upon alertness   Liquids provided via Cup    Other treatment/comments Potato and eggplant soup for breakfast. Pt extremely somnolent today. Fell asleep with small bolus of applesauce (1/2 teaspoon) in mouth. Pt with fast repetitive (protrusion-rest) labial movements when bringing bolus to mouth likely indicating neurological/cognitive deficits affecting pt's POs - when this occurred, pt stared at bolus but did not place in mouth until a quick movement to get bolus in his mouth. SLP postulated with wife that cognition is basis of pt's deficits, given previous session with SLP Joe Perry. SLP provided wife/educated wife with overt s/sx aspiration PNA and discussed each one. Pt was awoke x3 for 10-15 seconds with a verbal calling of pt's name. Once, tactile cues were necessary for pt to wake and remained awake for 10 seconds, and once today, tactile and verbal cues did not wake pt but wife had to shake patient's arm. SLP educated wife to provide empty spoon or a 1/2 teaspoon icewater as options for facilitating a swallow - succcess using those techniques 33%. Other cues used: tactile (finger stroking pt's neck at site of tongue base down to thyroid with auditory cue to "swallow now" - minimally successful. Auditory cues "1,2,3 - SWALLOW!"  minimally effective as well.       Assessment / Recommendations / Plan   Plan --   decr to once/week starting next week     Dysphagia Recommendations   Diet recommendations Regular;Nectar-thick liquid    Liquids provided via Cup    Medication Administration Whole meds with liquid    Supervision Full supervision/cueing for compensatory strategies    Compensations Slow rate;Small sips/bites;Multiple dry swallows after each bite/sip      Progression Toward Goals   Progression toward goals Not progressing toward goals (comment)   somnolence; lack of success with tactile/verbal cues           SLP Education - 03/31/20 1150     Education Details cues for pt to swallow at home when he is not eating/holding bolus on spoon or in his mouth    Person(s) Educated Patient;Spouse    Methods Explanation;Demonstration;Verbal cues    Comprehension Verbalized understanding;Verbal cues required;Need further instruction            SLP Short Term Goals - 03/31/20 1312      SLP SHORT TERM GOAL #1   Title Pt will swallow nectar liquids with external compensations within 8 seconds with usual mod A    Status Deferred   due to pt somnolence/decr'd attention and awareness     SLP SHORT TERM GOAL #2   Title pt wife will demo safe feeding and cueing of patient during POs in 2 sessions    Time 3    Period Weeks    Status On-going      SLP SHORT TERM GOAL #3   Title in 2 sessions, pt wife will tell SLP how aspiration PNA occurs, so she can notify MD ASAP if she suspects pt with PNA    Baseline 03-31-20    Time 3    Period Weeks    Status On-going            SLP Long Term Goals - 03/31/20 1313      SLP LONG TERM GOAL #1   Title in 4 sessions, pt wife will tell SLP how aspiration PNA occurs, so she can notify MD ASAP if she suspects pt with PNA    Baseline 03-31-20    Time 5    Period Weeks   or 13 total sessions   Status On-going      SLP LONG TERM GOAL #2   Title Pt will swallow liqiuds within 6 seconds with occasional min A over 2 sessions    Period Weeks    Status Deferred   due to pt somnolence/decr'd attention and awareness     SLP LONG TERM GOAL #3   Title pt wife will demo safe feeding and cueing of patient during POs in 4 sessions    Time 5    Period Weeks    Status On-going      SLP LONG TERM GOAL #4   Title pt will undergo furhter testing of language or cognition PRN    Time 4    Period Weeks    Status Deferred            Plan - 03/31/20 1151    Clinical Impression Statement Joe Perry continues to present with oropharyngeal dysphagia. Timeliness of oral phase and swallow initiation correlate with  level of alertness - today pt was observed to have repetitive alternating labial movements prior to pt placing bolus in his mouth "He does this all th etime at home -  just he looks at it. I have to tell him swallow." wife states. Pt inconsistent wtih progress due to somolance. Ongoing ST to cont once/week for training/education for spouse to maximize nutrition/hydration and safety of swallow with varying levels of alertness.    Speech Therapy Frequency 1x /week    Duration --   8 weeks or 17 visits   Treatment/Interventions Aspiration precaution training;Language facilitation;Environmental controls;Cueing hierarchy;Oral motor exercises;SLP instruction and feedback;Compensatory strategies;Functional tasks;Cognitive reorganization;Compensatory techniques;Diet toleration management by SLP;Trials of upgraded texture/liquids;Internal/external aids;Multimodal communcation approach;Patient/family education    Potential to Achieve Goals Fair    Potential Considerations Cooperation/participation level;Severity of impairments;Previous level of function           Patient will benefit from skilled therapeutic intervention in order to improve the following deficits and impairments:   Dysphagia, oropharyngeal phase  Dysarthria and anarthria  Cognitive communication deficit    Problem List Patient Active Problem List   Diagnosis Date Noted   Benign essential HTN    Cognitive deficit, post-stroke    Dysphagia, post-stroke    Controlled type 2 diabetes mellitus with hyperglycemia, without long-term current use of insulin ([name])    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 ([place]) 06/03/2019   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 [place], [place], [address] Phone: [phone]   Fax:  [phone]   Name: Joe ARMIJO Sr. MRN: [id number] Date of Birth: Aug 01, 1951

## 2020-03-31 NOTE — Patient Instructions (Signed)
Signs of Aspiration Pneumonia   . Chest pain/tightness . Fever (can be low grade) . Cough  o With foul-smelling phlegm (sputum) o With sputum containing pus or blood o With greenish sputum . Fatigue  . Shortness of breath  . Wheezing   **IF YOU HAVE THESE SIGNS, CONTACT YOUR DOCTOR OR GO TO THE EMERGENCY DEPARTMENT OR URGENT CARE AS SOON AS POSSIBLE**      

## 2020-04-03 ENCOUNTER — Other Ambulatory Visit: Payer: Self-pay | Admitting: Critical Care Medicine

## 2020-04-03 DIAGNOSIS — I69319 Unspecified symptoms and signs involving cognitive functions following cerebral infarction: Secondary | ICD-10-CM

## 2020-04-03 NOTE — Telephone Encounter (Signed)
Requested Prescriptions  Pending Prescriptions Disp Refills  . donepezil (ARICEPT) 10 MG tablet [Pharmacy Med Name: Donepezil HCl 10 MG Oral Tablet] 90 tablet 0    Sig: TAKE 1 TABLET BY MOUTH AT BEDTIME     Neurology:  Alzheimer's Agents Passed - 04/03/2020  5:19 PM      Passed - Valid encounter within last 6 months    Recent Outpatient Visits          1 month ago Hospital discharge follow-up   [place] Kerin Perna, NP   2 months ago Essential hypertension   [facility], MD   4 months ago Cough   [place], Cari S, PA-C   5 months ago Type 2 diabetes mellitus without complication, without long-term current use of insulin ([place])   [place] Fulp, [place], MD   5 months ago Type 2 diabetes mellitus without complication, without long-term current use of insulin ([place])   [place], MD      Future Appointments            In 1 week Antony Blackbird, MD [facility]

## 2020-04-05 ENCOUNTER — Ambulatory Visit: Payer: Medicare Other | Attending: Primary Care

## 2020-04-05 ENCOUNTER — Other Ambulatory Visit: Payer: Self-pay

## 2020-04-05 DIAGNOSIS — R1312 Dysphagia, oropharyngeal phase: Secondary | ICD-10-CM | POA: Diagnosis not present

## 2020-04-05 DIAGNOSIS — R41841 Cognitive communication deficit: Secondary | ICD-10-CM | POA: Diagnosis present

## 2020-04-05 DIAGNOSIS — R471 Dysarthria and anarthria: Secondary | ICD-10-CM | POA: Diagnosis not present

## 2020-04-05 NOTE — Therapy (Signed)
[facility] [address] [place], [place], [address] Phone: [phone]   Fax:  [phone]  Speech Language Pathology Treatment  Patient Details  Name: Joe SOBERANO Sr. MRN: [id number] Date of Birth: December 07, 1950 Referring Provider (SLP): Cammie Fulp   Encounter Date: 04/05/2020   End of Session - 04/05/20 1228    Visit Number 6    Number of Visits 17    Date for SLP Re-Evaluation 06/11/20    SLP Start Time 0850    SLP Stop Time  0930    SLP Time Calculation (min) 40 min    Activity Tolerance Patient limited by fatigue           Past Medical History:  Diagnosis Date  . Diabetes mellitus without complication ([place])   . Dysphagia    post stroke  . Hypertension   . Stroke [facility])     Past Surgical History:  Procedure Laterality Date  . IR ANGIO INTRA EXTRACRAN SEL COM CAROTID INNOMINATE UNI L MOD SED  03/10/2019  . IR ANGIO VERTEBRAL SEL SUBCLAVIAN INNOMINATE BILAT MOD SED  03/10/2019  . IR CT HEAD LTD  03/10/2019  . IR PERCUTANEOUS ART THROMBECTOMY/INFUSION INTRACRANIAL INC DIAG ANGIO  03/10/2019  . RADIOLOGY WITH ANESTHESIA N/A 03/10/2019   Procedure: IR WITH ANESTHESIA/CODE STROKE;  Surgeon: Radiologist, Medication, MD;  Location: [facility];  Service: Radiology;  Laterality: N/A;    There were no vitals filed for this visit.   Subjective Assessment - 04/05/20 0855    Patient is accompained by: Family member   wife   Currently in Pain? No/denies                 ADULT SLP TREATMENT - 04/05/20 0856      General Information   Behavior/Cognition Cooperative      Treatment Provided   Treatment provided Dysphagia      Dysphagia Treatment   Temperature Spikes Noted No    Respiratory Status Room air    Oral Cavity - Dentition Poor condition;Missing dentition    Treatment Methods Skilled observation;Patient/caregiver education    Patient observed directly with PO's Yes    Type of PO's observed Regular;Thin  liquids    Feeding Needs set up    Liquids provided via Cup    Oral Phase Signs & Symptoms Prolonged mastication;Oral holding    Pharyngeal Phase Signs & Symptoms Delayed throat clear;Delayed cough;Suspected delayed swallow initiation;Wet vocal quality    Type of cueing Verbal;Tactile    Amount of cueing Maximal    Other treatment/comments Pt alert and awake today with sandwich and hot tea to eat/drink. He req'd consistent max cues for taking small bites - however pt did not heed SLP cues and took large bites. "He is hungry this morning," wife stated. SLP told wife to pre-portion bites to ensure smaller bites. SLP used tactile coes for pt to swallow bolus before presenting additional bolus into oral cavity. SLP made wife aware she will need to cue pt to swallow and she stated she already does this multiple times a meal. SLP educated wife re: "wet" voice and rationale for throat clear and swallow request. Pt able to complete with consistent cues for increasing the strength of throat clear. SLP had to consistently nudge cup away from pt's mouth as he took consecutive multiple sips after liquid had cooled. SLP told wife to ensure pt taking single smaller sips.      Assessment / Recommendations / Plan  Plan Continue with current plan of care      Dysphagia Recommendations   Diet recommendations Regular;Nectar-thick liquid    Liquids provided via Cup    Medication Administration Whole meds with liquid    Supervision Full supervision/cueing for compensatory strategies    Compensations Slow rate;Small sips/bites;Multiple dry swallows after each bite/sip   fully swallow prior to presenting next bolus     Progression Toward Goals   Progression toward goals Progressing toward goals            SLP Education - 04/05/20 1226    Education Details cut food/portion food for pt (especially when pt tired), use tactile and visual cues for not stuffing food, and a "swallow" cue when food or liquid remains in  oral cavity, "wet" voice means have pt clear throat and swallow to clear from laryngeal vestibule.    Person(s) Educated Patient;Spouse    Methods Explanation;Demonstration;Tactile cues;Verbal cues    Comprehension Verbalized understanding;Verbal cues required;Tactile cues required;Need further instruction            SLP Short Term Goals - 04/05/20 1302      SLP SHORT TERM GOAL #1   Title Pt will swallow nectar liquids with external compensations within 8 seconds with usual mod A    Status Deferred   due to pt somnolence/decr'd attention and awareness     SLP SHORT TERM GOAL #2   Title pt wife will demo safe feeding and cueing of patient during POs in 2 sessions    Time 2    Period Weeks    Status On-going      SLP SHORT TERM GOAL #3   Title in 2 sessions, pt wife will tell SLP how aspiration PNA occurs, so she can notify MD ASAP if she suspects pt with PNA    Baseline 03-31-20    Time 2    Period Weeks    Status On-going            SLP Long Term Goals - 04/05/20 1303      SLP LONG TERM GOAL #1   Title in 4 sessions, pt wife will tell SLP how aspiration PNA occurs, so she can notify MD ASAP if she suspects pt with PNA    Baseline 03-31-20    Time 4    Period Weeks   or 13 total sessions   Status On-going      SLP LONG TERM GOAL #2   Title Pt will swallow liqiuds within 6 seconds with occasional min A over 2 sessions    Period Weeks    Status Deferred   due to pt somnolence/decr'd attention and awareness     SLP LONG TERM GOAL #3   Title pt wife will demo safe feeding and cueing of patient during POs in 4 sessions    Time 5    Period Weeks    Status On-going      SLP LONG TERM GOAL #4   Title pt will undergo furhter testing of language or cognition PRN    Time 4    Period Weeks    Status Deferred            Plan - 04/05/20 1228    Clinical Impression Statement Mr. Stgermaine continues to present with primariliy cognitively-based oropharyngeal dysphagia.  Timeliness of oral phase and swallow initiation correlate with level of alertness. See "skilled intervention" for details. Ongoing ST to cont once/week for training/education for spouse to maximize nutrition/hydration and safety  of swallow with varying levels of alertness.    Speech Therapy Frequency 1x /week    Duration --   8 weeks or 17 visits   Treatment/Interventions Aspiration precaution training;Language facilitation;Environmental controls;Cueing hierarchy;Oral motor exercises;SLP instruction and feedback;Compensatory strategies;Functional tasks;Cognitive reorganization;Compensatory techniques;Diet toleration management by SLP;Trials of upgraded texture/liquids;Internal/external aids;Multimodal communcation approach;Patient/family education    Potential to Achieve Goals Fair    Potential Considerations Cooperation/participation level;Severity of impairments;Previous level of function           Patient will benefit from skilled therapeutic intervention in order to improve the following deficits and impairments:   Dysphagia, oropharyngeal phase  Dysarthria and anarthria  Cognitive communication deficit    Problem List Patient Active Problem List   Diagnosis Date Noted  . Benign essential HTN   . Cognitive deficit, post-stroke   . Dysphagia, post-stroke   . Controlled type 2 diabetes mellitus with hyperglycemia, without long-term current use of insulin ([place])   . 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 Dyslipidemia, goal LDL below 70 03/12/2019  . Dysphagia due to recent stroke 03/12/2019  . Overweight 03/12/2019  . 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 Abnormality of gait   .  Spasticity   . Gait disturbance, post-stroke   . Cerebrovascular accident (CVA) due to occlusion of vertebral artery ([place])   . Essential hypertension 01/31/2016  . 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    [facility] ,[place], CCC-SLP  04/05/2020, 1:04 PM  [facility] [address] [place], [place], [address] Phone: [phone]   Fax:  [phone]   Name: Joe GALLENTINE Sr. MRN: [id number] Date of Birth: 30-May-1951

## 2020-04-09 ENCOUNTER — Other Ambulatory Visit: Payer: Self-pay

## 2020-04-09 ENCOUNTER — Ambulatory Visit: Payer: Medicare Other

## 2020-04-09 DIAGNOSIS — R471 Dysarthria and anarthria: Secondary | ICD-10-CM

## 2020-04-09 DIAGNOSIS — R1312 Dysphagia, oropharyngeal phase: Secondary | ICD-10-CM

## 2020-04-09 DIAGNOSIS — R41841 Cognitive communication deficit: Secondary | ICD-10-CM

## 2020-04-09 NOTE — Therapy (Signed)
[facility] [address] [place], [place], [address] Phone: [phone]   Fax:  [phone]  Speech Language Pathology Treatment  Patient Details  Name: Joe Perry Sr. MRN: [id number] Date of Birth: 1951-02-08 Referring Provider (SLP): Cammie Fulp   Encounter Date: 04/09/2020   End of Session - 04/09/20 1729    Visit Number 7    Number of Visits 17    Date for SLP Re-Evaluation 06/11/20    SLP Start Time 0935    SLP Stop Time  8242    SLP Time Calculation (min) 40 min    Activity Tolerance Patient limited by fatigue           Past Medical History:  Diagnosis Date  . Diabetes mellitus without complication ([place])   . Dysphagia    post stroke  . Hypertension   . Stroke [facility])     Past Surgical History:  Procedure Laterality Date  . IR ANGIO INTRA EXTRACRAN SEL COM CAROTID INNOMINATE UNI L MOD SED  03/10/2019  . IR ANGIO VERTEBRAL SEL SUBCLAVIAN INNOMINATE BILAT MOD SED  03/10/2019  . IR CT HEAD LTD  03/10/2019  . IR PERCUTANEOUS ART THROMBECTOMY/INFUSION INTRACRANIAL INC DIAG ANGIO  03/10/2019  . RADIOLOGY WITH ANESTHESIA N/A 03/10/2019   Procedure: IR WITH ANESTHESIA/CODE STROKE;  Surgeon: Radiologist, Medication, MD;  Location: [facility];  Service: Radiology;  Laterality: N/A;    There were no vitals filed for this visit.   Subjective Assessment - 04/09/20 1001    Subjective Pt awake and alert upon start of ST.    Patient is accompained by: Family member   wife   Currently in Pain? No/denies                 ADULT SLP TREATMENT - 04/09/20 0001      General Information   Behavior/Cognition Cooperative;Alert;Confused;Requires cueing;Doesn't follow directions      Treatment Provided   Treatment provided Dysphagia      Dysphagia Treatment   Temperature Spikes Noted No    Respiratory Status Room air    Oral Cavity - Dentition Poor condition;Missing dentition    Treatment Methods Skilled  observation;Patient/caregiver education    Patient observed directly with PO's Yes    Type of PO's observed Dysphagia 3 (soft);Thin liquids    Feeding Needs set up   portion control   Liquids provided via Cup    Oral Phase Signs & Symptoms Prolonged mastication;Oral holding   SLP had to ask pt question for him to swallow-then pt answer   Pharyngeal Phase Signs & Symptoms Delayed cough;Wet vocal quality    Type of cueing Verbal;Visual    Amount of cueing Minimal   consistent cues (>80% of the time)   Other treatment/comments SLP educated wife re: portion control for pt - wife agreed pt is more safe with bite sizes chosen for him instead of him choosing bite sizes. SLP told pt wife to be alert when pt drinking - she may need to gently encourage pt to take cup from mouth if pt with ocnsecutive sips. Wife answered "But he needs the water." SLP reiterated there is a fine line between swallow safety and allowing pt to engage in unsafe habit for his hydrational need and that professionally SLP stated small sips are best for pt safety. SLP reitereated to wife to ensure pt has all food out of mouth prior to allowing him to drink or he could choke. Wife demonstrated understanding. Today wife  cueing pt repeatedly without giveing pt ample time to respond due to his difficulty initiation. SLP educated pt wife that it may take pt 7-10 seconds to respond appropriately and explained pt difficulty wiht initiation. Wife demonstrated understnading.       Assessment / Recommendations / Plan   Plan Continue with current plan of care      Dysphagia Recommendations   Diet recommendations Regular;Nectar-thick liquid   or thin, if portion is controlled           SLP Education - 04/09/20 1729    Education Details see "skilled intervention"            SLP Short Term Goals - 04/09/20 1732      SLP SHORT TERM GOAL #1   Title Pt will swallow nectar liquids with external compensations within 8 seconds with usual mod  A    Status Deferred   due to pt somnolence/decr'd attention and awareness     SLP SHORT TERM GOAL #2   Title pt wife will demo safe feeding and cueing of patient during POs in 2 sessions    Time 2    Period Weeks    Status On-going      SLP SHORT TERM GOAL #3   Title in 2 sessions, pt wife will tell SLP how aspiration PNA occurs, so she can notify MD ASAP if she suspects pt with PNA    Baseline 03-31-20    Time 2    Period Weeks    Status On-going            SLP Long Term Goals - 04/09/20 1733      SLP LONG TERM GOAL #1   Title in 4 sessions, pt wife will tell SLP how aspiration PNA occurs, so she can notify MD ASAP if she suspects pt with PNA    Baseline 03-31-20    Time 4    Period Weeks   or 13 total sessions   Status On-going      SLP LONG TERM GOAL #2   Title Pt will swallow liqiuds within 6 seconds with occasional min A over 2 sessions    Period Weeks    Status Deferred   due to pt somnolence/decr'd attention and awareness     SLP LONG TERM GOAL #3   Title pt wife will demo safe feeding and cueing of patient during POs in 4 sessions    Time 5    Period Weeks    Status On-going      SLP LONG TERM GOAL #4   Title pt will undergo furhter testing of language or cognition PRN    Time 4    Period Weeks    Status Deferred            Plan - 04/09/20 1729    Clinical Impression Statement Joe Perry continues to present with primariliy cognitively-based oropharyngeal dysphagia. Timeliness of oral phase and swallow initiation somewhat correlates with level of alertness (less alert - longer intitation times, more alert - mod initiation times). See "skilled intervention" for details. Ongoing ST to cont once/week for training/education for spouse to maximize nutrition/hydration and safety of swallow with varying levels of alertness.Wife continued to keep pt scheduled twice/week so SLP to talk to pt wife again, next session, about decr to once/week.    Speech Therapy Frequency  2x /week    Duration --   8 weeks or 17 visits   Treatment/Interventions Aspiration precaution training;Language facilitation;Environmental controls;Cueing hierarchy;Oral  motor exercises;SLP instruction and feedback;Compensatory strategies;Functional tasks;Cognitive reorganization;Compensatory techniques;Diet toleration management by SLP;Trials of upgraded texture/liquids;Internal/external aids;Multimodal communcation approach;Patient/family education    Potential to Achieve Goals Fair    Potential Considerations Cooperation/participation level;Severity of impairments;Previous level of function           Patient will benefit from skilled therapeutic intervention in order to improve the following deficits and impairments:   Dysphagia, oropharyngeal phase  Cognitive communication deficit  Dysarthria and anarthria    Problem List Patient Active Problem List   Diagnosis Date Noted  . Benign essential HTN   . Cognitive deficit, post-stroke   . Dysphagia, post-stroke   . Controlled type 2 diabetes mellitus with hyperglycemia, without long-term current use of insulin ([place])   . 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 ([place]) 06/03/2019  . 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 Dyslipidemia, goal LDL below 70 03/12/2019  . Dysphagia due to recent stroke 03/12/2019  . Overweight 03/12/2019  . 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 Abnormality of gait   . Spasticity   . Gait disturbance, post-stroke   . Cerebrovascular accident (CVA) due to occlusion of vertebral artery ([place])   . Essential hypertension 01/31/2016  . 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    [facility] ,[place], [place]  04/09/2020, 5:34 PM  [facility] [address] [place], [place], [address] Phone: [phone]   Fax:  [phone]   Name: Joe LEANDRO Sr. MRN: [id number] Date of Birth: 02-13-1951

## 2020-04-13 ENCOUNTER — Other Ambulatory Visit: Payer: Self-pay

## 2020-04-13 ENCOUNTER — Ambulatory Visit: Payer: Medicare Other

## 2020-04-13 DIAGNOSIS — R41841 Cognitive communication deficit: Secondary | ICD-10-CM

## 2020-04-13 DIAGNOSIS — R1312 Dysphagia, oropharyngeal phase: Secondary | ICD-10-CM | POA: Diagnosis not present

## 2020-04-13 DIAGNOSIS — R471 Dysarthria and anarthria: Secondary | ICD-10-CM | POA: Diagnosis not present

## 2020-04-13 NOTE — Therapy (Signed)
[facility] [address] [place], [place], [address] Phone: [phone]   Fax:  [phone]  Speech Language Pathology Treatment  Patient Details  Name: Joe CAPELL Sr. MRN: [id number] Date of Birth: [birth year] Referring Provider (SLP): Cammie Fulp   Encounter Date: 04/13/2020   End of Session - 04/13/20 1015    Visit Number 8    Number of Visits 17    Date for SLP Re-Evaluation 06/11/20    SLP Start Time 0850    SLP Stop Time  0930    SLP Time Calculation (min) 40 min    Activity Tolerance Patient limited by fatigue           Past Medical History:  Diagnosis Date  . Diabetes mellitus without complication ([place])   . Dysphagia    post stroke  . Hypertension   . Stroke [facility])     Past Surgical History:  Procedure Laterality Date  . IR ANGIO INTRA EXTRACRAN SEL COM CAROTID INNOMINATE UNI L MOD SED  03/10/2019  . IR ANGIO VERTEBRAL SEL SUBCLAVIAN INNOMINATE BILAT MOD SED  03/10/2019  . IR CT HEAD LTD  03/10/2019  . IR PERCUTANEOUS ART THROMBECTOMY/INFUSION INTRACRANIAL INC DIAG ANGIO  03/10/2019  . RADIOLOGY WITH ANESTHESIA N/A 03/10/2019   Procedure: IR WITH ANESTHESIA/CODE STROKE;  Surgeon: Radiologist, Medication, MD;  Location: [facility];  Service: Radiology;  Laterality: N/A;    There were no vitals filed for this visit.   Subjective Assessment - 04/13/20 1001    Subjective Pt awake and alert upon start of ST.    Patient is accompained by: Family member   wife   Currently in Pain? No/denies                 ADULT SLP TREATMENT - 04/13/20 1001      General Information   Behavior/Cognition Cooperative;Alert;Confused;Requires cueing;Doesn't follow directions      Treatment Provided   Treatment provided Dysphagia      Dysphagia Treatment   Temperature Spikes Noted No    Respiratory Status Room air    Oral Cavity - Dentition Poor condition;Missing dentition    Treatment Methods Skilled  observation;Compensation strategy training;Patient/caregiver education    Patient observed directly with PO's Yes    Type of PO's observed Dysphagia 3 (soft);Thin liquids    Feeding Needs set up   and monitoring during meal   Liquids provided via Cup    Oral Phase Signs & Symptoms Prolonged mastication;Oral holding    Pharyngeal Phase Signs & Symptoms Suspected delayed swallow initiation    Type of cueing Verbal;Visual    Amount of cueing Minimal    Other treatment/comments Wife provided portion control - however was not always successful - pt would scoop more onto spoon 20% of the time. Pt req'd cues for small sips, occasionally. Wife with cues only when necessary - SLP congratulated her with this due to providing too many cues or distracting comments in prior sessions. Pt without any overt s/sx aspiration today with POs. Pt took meds with liquid (water) without incident.       Assessment / Recommendations / Plan   Plan --   decr to once/week due to progress     Dysphagia Recommendations   Diet recommendations Regular;Nectar-thick liquid;Thin liquid   thin with portion control   Liquids provided via Cup    Medication Administration Whole meds with liquid    Supervision Full supervision/cueing for compensatory strategies    Compensations Slow  rate;Small sips/bites;Multiple dry swallows after each bite/sip      Progression Toward Goals   Progression toward goals Progressing toward goals              SLP Short Term Goals - 04/13/20 1303      SLP SHORT TERM GOAL #1   Title Pt will swallow nectar liquids with external compensations within 8 seconds with usual mod A    Status Deferred   due to pt somnolence/decr'd attention and awareness     SLP SHORT TERM GOAL #2   Title pt wife will demo safe feeding and cueing of patient during POs in 2 sessions    Baseline 04-13-20    Time 1    Period Weeks    Status On-going      SLP SHORT TERM GOAL #3   Title in 2 sessions, pt wife will  tell SLP how aspiration PNA occurs, so she can notify MD ASAP if she suspects pt with PNA    Baseline 03-31-20, 04-13-20    Status Achieved            SLP Long Term Goals - 04/13/20 1304      SLP LONG TERM GOAL #1   Title in 4 sessions, pt wife will tell SLP how aspiration PNA occurs, so she can notify MD ASAP if she suspects pt with PNA    Baseline 03-31-20, 04-13-20    Time 3    Period Weeks   or 13 total sessions   Status On-going      SLP LONG TERM GOAL #2   Title Pt will swallow liqiuds within 6 seconds with occasional min A over 2 sessions    Period Weeks    Status Deferred   due to pt somnolence/decr'd attention and awareness     SLP LONG TERM GOAL #3   Title pt wife will demo safe feeding and cueing of patient during POs in 4 sessions    Baseline 04-13-20    Time 4    Period Weeks    Status On-going      SLP LONG TERM GOAL #4   Title pt will undergo furhter testing of language or cognition PRN    Time 4    Period Weeks    Status Deferred            Plan - 04/13/20 1017    Clinical Impression Statement Mr. Pelzer continues to present with primariliy cognitively-based oropharyngeal dysphagia. Timeliness of oral phase and swallow initiation somewhat correlates with level of alertness (less alert - longer intitation times, or more alert - min to mod initiation times). See "skilled intervention" for details. Pt's wife proving to be more effective with less-frequenct and more pertinent cueing. Decr frequency to once/week due to progress. Ongoing ST to cont once/week for training/education for spouse to maximize nutrition/hydration and safety of swallow with varying levels of alertness.Wife continued to keep pt scheduled twice/week so SLP to talk to pt wife again, next session, about decr to once/week.    Speech Therapy Frequency 1x /week    Duration --   8 weeks or 17 visits   Treatment/Interventions Aspiration precaution training;Language facilitation;Environmental  controls;Cueing hierarchy;Oral motor exercises;SLP instruction and feedback;Compensatory strategies;Functional tasks;Cognitive reorganization;Compensatory techniques;Diet toleration management by SLP;Trials of upgraded texture/liquids;Internal/external aids;Multimodal communcation approach;Patient/family education    Potential to Achieve Goals Fair    Potential Considerations Cooperation/participation level;Severity of impairments;Previous level of function           Patient will  benefit from skilled therapeutic intervention in order to improve the following deficits and impairments:   Dysphagia, oropharyngeal phase  Cognitive communication deficit  Dysarthria and anarthria    Problem List Patient Active Problem List   Diagnosis Date Noted  . Benign essential HTN   . Cognitive deficit, post-stroke   . Dysphagia, post-stroke   . Controlled type 2 diabetes mellitus with hyperglycemia, without long-term current use of insulin ([place])   . 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 ([place]) 06/03/2019  . 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 Dyslipidemia, goal LDL below 70 03/12/2019  . Dysphagia due to recent stroke 03/12/2019  . Overweight 03/12/2019  . 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 Abnormality of gait   . Spasticity   . Gait disturbance, post-stroke   . 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 Essential hypertension 01/31/2016  . 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    [facility] ,MS, CCC-SLP  04/13/2020, 1:05 PM  [facility] [address] [place], [place], [address] Phone: [phone]   Fax:  [phone]   Name: Joe AGUINO Sr. MRN: [id number] Date  of Birth: 03/31/1951

## 2020-04-15 ENCOUNTER — Ambulatory Visit: Payer: 59 | Admitting: Family Medicine

## 2020-04-16 ENCOUNTER — Ambulatory Visit: Payer: Medicare Other

## 2020-04-19 ENCOUNTER — Ambulatory Visit: Payer: Medicare Other | Admitting: Speech Pathology

## 2020-04-19 ENCOUNTER — Encounter: Payer: Self-pay | Admitting: Speech Pathology

## 2020-04-19 ENCOUNTER — Other Ambulatory Visit: Payer: Self-pay

## 2020-04-19 DIAGNOSIS — R1312 Dysphagia, oropharyngeal phase: Secondary | ICD-10-CM | POA: Diagnosis not present

## 2020-04-19 DIAGNOSIS — R41841 Cognitive communication deficit: Secondary | ICD-10-CM

## 2020-04-19 DIAGNOSIS — R471 Dysarthria and anarthria: Secondary | ICD-10-CM | POA: Diagnosis not present

## 2020-04-19 NOTE — Patient Instructions (Addendum)
   [facility] - adult day care and services for the elderly [phone]  [facility] day [facility] - [phone]  Short, direct cues and visual and tactile cues  Limit distractions with meals, including conversation  Encourage him to cough occasionally throughout the meal

## 2020-04-19 NOTE — Therapy (Signed)
[facility] [address] [place], [place], [address] Phone: [phone]   Fax:  [phone]  Speech Language Pathology Treatment  Patient Details  Name: Joe BUENAVENTURA Sr. MRN: [id number] Date of Birth: 1951-02-10 Referring Provider (SLP): Cammie Fulp   Encounter Date: 04/19/2020   End of Session - 04/19/20 1045    Visit Number 9    Number of Visits 17    Date for SLP Re-Evaluation 06/11/20    Authorization Type self pay at this time    SLP Start Time 0935    SLP Stop Time  6063    SLP Time Calculation (min) 40 min           Past Medical History:  Diagnosis Date  . Diabetes mellitus without complication ([place])   . Dysphagia    post stroke  . Hypertension   . Stroke [facility])     Past Surgical History:  Procedure Laterality Date  . IR ANGIO INTRA EXTRACRAN SEL COM CAROTID INNOMINATE UNI L MOD SED  03/10/2019  . IR ANGIO VERTEBRAL SEL SUBCLAVIAN INNOMINATE BILAT MOD SED  03/10/2019  . IR CT HEAD LTD  03/10/2019  . IR PERCUTANEOUS ART THROMBECTOMY/INFUSION INTRACRANIAL INC DIAG ANGIO  03/10/2019  . RADIOLOGY WITH ANESTHESIA N/A 03/10/2019   Procedure: IR WITH ANESTHESIA/CODE STROKE;  Surgeon: Radiologist, Medication, MD;  Location: [facility];  Service: Radiology;  Laterality: N/A;    There were no vitals filed for this visit.   Subjective Assessment - 04/19/20 1035    Subjective He does it here, but not at home    Patient is accompained by: Family member   wife   Currently in Pain? No/denies                 ADULT SLP TREATMENT - 04/19/20 1035      General Information   Behavior/Cognition Alert;Cooperative      Treatment Provided   Treatment provided Dysphagia      Dysphagia Treatment   Temperature Spikes Noted No    Respiratory Status Room air    Oral Cavity - Dentition Poor condition;Missing dentition    Treatment Methods Skilled observation;Compensation strategy training;Patient/caregiver education      Patient observed directly with PO's Yes    Type of PO's observed Dysphagia 3 (soft);Thin liquids    Feeding Able to feed self;Needs set up    Liquids provided via Cup    Oral Phase Signs & Symptoms Prolonged mastication;Oral holding    Pharyngeal Phase Signs & Symptoms Suspected delayed swallow initiation    Type of cueing Verbal;Visual    Amount of cueing Minimal    Other treatment/comments Spouse continues to demonstrate more consise cueing, as well as visual tactile cues. Ongoing re-inforcement of this, as well as instruction and modeling. Today, Mr. Majkowski demonstrated mildly prolonged mastication with solids, however mastication adequate. He took bites and swallowed before next bite with rare min A today. Intermittent coughing noted, with wet vocal quality. Verbal cues to cough resulted in more effective cough. Training with sposue to cue pt to cough hard when he needs to cough.  Spouse verbalized s/s of aspiration pna with min questioning cues      Assessment / Recommendations / Plan   Plan Continue with current plan of care      Dysphagia Recommendations   Diet recommendations Dysphagia 3 (mechanical soft);Thin liquid    Liquids provided via Cup    Medication Administration Whole meds with liquid  Supervision Full supervision/cueing for compensatory strategies    Compensations Slow rate;Small sips/bites;Multiple dry swallows after each bite/sip    Postural Changes and/or Swallow Maneuvers Out of bed for meals;Seated upright 90 degrees      Progression Toward Goals   Progression toward goals Progressing toward goals            SLP Education - 04/19/20 1041    Education Details cueing pt during meals, s/s of aspiration    Person(s) Educated Patient;Spouse            SLP Short Term Goals - 04/19/20 1043      SLP SHORT TERM GOAL #1   Title Pt will swallow nectar liquids with external compensations within 8 seconds with usual mod A    Status Deferred   due to pt  somnolence/decr'd attention and awareness     SLP SHORT TERM GOAL #2   Title pt wife will demo safe feeding and cueing of patient during POs in 2 sessions    Baseline 04-13-20    Time 1    Period Weeks    Status On-going      SLP SHORT TERM GOAL #3   Title in 2 sessions, pt wife will tell SLP how aspiration PNA occurs, so she can notify MD ASAP if she suspects pt with PNA    Baseline 03-31-20, 04-13-20    Status Achieved            SLP Long Term Goals - 04/19/20 1043      SLP LONG TERM GOAL #1   Title in 4 sessions, pt wife will tell SLP how aspiration PNA occurs, so she can notify MD ASAP if she suspects pt with PNA    Baseline 03-31-20, 04-13-20; 04/19/20    Time 3    Period Weeks   or 13 total sessions   Status On-going      SLP LONG TERM GOAL #2   Title Pt will swallow liqiuds within 6 seconds with occasional min A over 2 sessions    Period Weeks    Status Deferred   due to pt somnolence/decr'd attention and awareness     SLP LONG TERM GOAL #3   Title pt wife will demo safe feeding and cueing of patient during POs in 4 sessions    Baseline 04-13-20; 04-19-20    Time 3    Period Weeks    Status On-going      SLP LONG TERM GOAL #4   Title pt will undergo furhter testing of language or cognition PRN    Time 4    Period Weeks    Status Deferred            Plan - 04/19/20 1043    Clinical Impression Statement Mr. Crissman continues to present with primariliy cognitively-based oropharyngeal dysphagia. Timeliness of oral phase and swallow initiation somewhat correlates with level of alertness (less alert - longer intitation times, or more alert - min to mod initiation times). See "skilled intervention" for details. Pt's wife proving to be more effective with less-frequenct and more pertinent cueing. Decr frequency to once/week due to progress. Ongoing ST to cont once/week for training/education for spouse to maximize nutrition/hydration and safety of swallow with varying levels of  alertness.Wife continued to keep pt scheduled twice/week so SLP to talk to pt wife again, next session, about decr to once/week.    Speech Therapy Frequency 1x /week    Duration --   8 weeks or 17 visits  Treatment/Interventions Aspiration precaution training;Language facilitation;Environmental controls;Cueing hierarchy;Oral motor exercises;SLP instruction and feedback;Compensatory strategies;Functional tasks;Cognitive reorganization;Compensatory techniques;Diet toleration management by SLP;Trials of upgraded texture/liquids;Internal/external aids;Multimodal communcation approach;Patient/family education    Potential to Achieve Goals Fair    Potential Considerations Cooperation/participation level;Severity of impairments;Previous level of function           Patient will benefit from skilled therapeutic intervention in order to improve the following deficits and impairments:   Dysphagia, oropharyngeal phase  Cognitive communication deficit    Problem List Patient Active Problem List   Diagnosis Date Noted  . Benign essential HTN   . Cognitive deficit, post-stroke   . Dysphagia, post-stroke   . Controlled type 2 diabetes mellitus with hyperglycemia, without long-term current use of insulin ([place])   . 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 ([place]) 06/03/2019  . 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 Dyslipidemia, goal LDL below 70 03/12/2019  . Dysphagia due to recent stroke 03/12/2019  . Overweight 03/12/2019  . 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 Abnormality of gait   . Spasticity   . Gait disturbance, post-stroke   . Cerebrovascular accident (CVA) due to occlusion of vertebral artery ([place])   . Essential hypertension 01/31/2016  . Ataxia  01/31/2016    Alacia Rehmann, Annye Rusk MS, CCC-SLP 04/19/2020, 10:46 AM  [facility] [address] [place], [place], [address] Phone: [phone]   Fax:  [phone]   Name: Joe JENNIGES Sr. MRN: [id number] Date of Birth: [birth year]

## 2020-04-21 ENCOUNTER — Ambulatory Visit: Payer: Medicare Other

## 2020-04-21 ENCOUNTER — Other Ambulatory Visit: Payer: Self-pay

## 2020-04-21 DIAGNOSIS — R471 Dysarthria and anarthria: Secondary | ICD-10-CM | POA: Diagnosis not present

## 2020-04-21 DIAGNOSIS — R1312 Dysphagia, oropharyngeal phase: Secondary | ICD-10-CM

## 2020-04-21 DIAGNOSIS — R41841 Cognitive communication deficit: Secondary | ICD-10-CM

## 2020-04-21 NOTE — Therapy (Signed)
[facility] [address] [place], [place], [address] Phone: [phone]   Fax:  [phone]  Speech Language Pathology Treatment/Progress note  Patient Details  Name: Joe PICKERING Sr. MRN: [id number] Date of Birth: Jan 25, 1951 Referring Provider (SLP): Cammie Fulp   Encounter Date: 04/21/2020   End of Session - 04/21/20 1247    Visit Number 10    Number of Visits 17    Date for SLP Re-Evaluation 06/11/20    SLP Start Time 0936    SLP Stop Time  1017    SLP Time Calculation (min) 41 min    Activity Tolerance Patient tolerated treatment well           Past Medical History:  Diagnosis Date  . Diabetes mellitus without complication ([place])   . Dysphagia    post stroke  . Hypertension   . Stroke [facility])     Past Surgical History:  Procedure Laterality Date  . IR ANGIO INTRA EXTRACRAN SEL COM CAROTID INNOMINATE UNI L MOD SED  03/10/2019  . IR ANGIO VERTEBRAL SEL SUBCLAVIAN INNOMINATE BILAT MOD SED  03/10/2019  . IR CT HEAD LTD  03/10/2019  . IR PERCUTANEOUS ART THROMBECTOMY/INFUSION INTRACRANIAL INC DIAG ANGIO  03/10/2019  . RADIOLOGY WITH ANESTHESIA N/A 03/10/2019   Procedure: IR WITH ANESTHESIA/CODE STROKE;  Surgeon: Radiologist, Medication, MD;  Location: [facility];  Service: Radiology;  Laterality: N/A;    There were no vitals filed for this visit.   Subjective Assessment - 04/21/20 1208    Subjective Pt awake and alert today - but did not greet SLP today upon SLP greeting pt.    Patient is accompained by: Family member   wife   Currently in Pain? No/denies                 ADULT SLP TREATMENT - 04/21/20 1208      General Information   Behavior/Cognition Alert;Cooperative;Impulsive;Requires cueing      Treatment Provided   Treatment provided Dysphagia      Dysphagia Treatment   Temperature Spikes Noted No    Respiratory Status Room air    Oral Cavity - Dentition Poor condition;Missing dentition     Treatment Methods Skilled observation;Compensation strategy training;Patient/caregiver education    Patient observed directly with PO's Yes    Type of PO's observed Dysphagia 3 (soft);Thin liquids    Feeding Able to feed self;Needs set up    Liquids provided via Cup    Oral Phase Signs & Symptoms Prolonged mastication;Oral holding    Pharyngeal Phase Signs & Symptoms Suspected delayed swallow initiation;Immediate cough   x2/133  boluses   Type of cueing Verbal;Visual    Amount of cueing --   min-mod, usual   Other treatment/comments Wife, upon direct questioning from SLP states that pt's frequency of coughing with meals has decreased compared to the time prior to skilled ST. As above, usual min-mod cues for finishing and swallowing bite in mouth prior to placing another bolus in his mouth. Consistent cues initially for small bites and sips, faded to occasional until pt placed large sip in his mouth, then back to usual frequency cueing. SLP noted today that wife was better with concise and less frequent cueing than prior to the ST visit (on 04-13-20) after her oral surgery. SLP demonstrated tactile cues for pt to swallow before placing more food or a sip in his mouth  - consistent cues necessary for this today. SPL told wife she would need to  cont to cue pt for cough or throat clear, and then swallow when this occurs. SLP drew parallel with this necessary cue and wife needing to cue pt to wipe his nose due to pt not aware of either of these acts. Wife demonstrated understanding. SLP reviewed overt s/sx aspiraiton PNA with wife who pulled them out of a folder without cues when SLP mentioned these s/sx. Wife states she still has to call to inquire about adult day care services for respite care.       Assessment / Recommendations / Plan   Plan --   d/c likely next visit     Dysphagia Recommendations   Diet recommendations Dysphagia 3 (mechanical soft);Thin liquid;Nectar-thick liquid   thickness of liquid  dependent upon pt alertness   Liquids provided via Cup    Medication Administration Whole meds with liquid    Supervision Full supervision/cueing for compensatory strategies    Compensations Slow rate;Small sips/bites;Multiple dry swallows after each bite/sip    Postural Changes and/or Swallow Maneuvers Out of bed for meals;Seated upright 90 degrees      Progression Toward Goals   Progression toward goals Progressing toward goals            SLP Education - 04/21/20 1246    Education Details see "pt instructions" and "skilled intervention"    Person(s) Educated Patient;Spouse    Methods Explanation;Demonstration;Tactile cues;Verbal cues;Handout    Comprehension Verbalized understanding;Returned demonstration;Verbal cues required;Tactile cues required;Need further instruction            SLP Short Term Goals - 04/21/20 1249      SLP SHORT TERM GOAL #1   Title Pt will swallow nectar liquids with external compensations within 8 seconds with usual mod A    Status Deferred   due to pt somnolence/decr'd attention and awareness     SLP SHORT TERM GOAL #2   Title pt wife will demo safe feeding and cueing of patient during POs in 2 sessions    Baseline 04-13-20    Status Partially Met      SLP SHORT TERM GOAL #3   Title in 2 sessions, pt wife will tell SLP how aspiration PNA occurs, so she can notify MD ASAP if she suspects pt with PNA    Baseline 03-31-20, 04-13-20    Status Achieved            SLP Long Term Goals - 04/21/20 1249      SLP LONG TERM GOAL #1   Title in 4 sessions, pt wife will tell SLP how aspiration PNA occurs, so she can notify MD ASAP if she suspects pt with PNA    Baseline 03-31-20, 04-13-20; 04/19/20    Period --   or 13 total sessions   Status Achieved      SLP LONG TERM GOAL #2   Title Pt will swallow liqiuds within 6 seconds with occasional min A over 2 sessions    Period Weeks    Status Deferred   due to pt somnolence/decr'd attention and awareness      SLP LONG TERM GOAL #3   Title pt wife will demo safe feeding and cueing of patient during POs in 4 sessions    Baseline 04-13-20; 04-19-20, 04-21-20    Time 3    Period Weeks    Status On-going      SLP LONG TERM GOAL #4   Title pt will undergo furhter testing of language or cognition PRN    Time 4  Period Weeks    Status Deferred            Plan - 04/21/20 1247    Clinical Impression Statement Mr. Perusse continues to present with primariliy cognitively-based oropharyngeal dysphagia. Timeliness of oral phase and swallow initiation somewhat correlates with level of alertness (less alert - longer intitation times, or more alert - min to mod initiation times). See "skilled intervention" for details. Pt's wife proving to be more effective with less-frequenct and more pertinent cueing. Decr frequency to once/week due to progress. Ongoing ST to cont once/week for training/education for spouse to maximize nutrition/hydration and safety of swallow with varying levels of alertness. Pt wife and SLP agree pt to be decr'd to once/week, and next session will be d/c as wife is showing adequate/good skill in cueing pt during POs.    Speech Therapy Frequency 1x /week    Duration --   8 weeks or 17 visits   Treatment/Interventions Aspiration precaution training;Language facilitation;Environmental controls;Cueing hierarchy;Oral motor exercises;SLP instruction and feedback;Compensatory strategies;Functional tasks;Cognitive reorganization;Compensatory techniques;Diet toleration management by SLP;Trials of upgraded texture/liquids;Internal/external aids;Multimodal communcation approach;Patient/family education    Potential to Achieve Goals Fair    Potential Considerations Cooperation/participation level;Severity of impairments;Previous level of function           Patient will benefit from skilled therapeutic intervention in order to improve the following deficits and impairments:   Dysphagia, oropharyngeal  phase  Cognitive communication deficit  Dysarthria and anarthria   Speech Therapy Progress Note  Dates of Reporting Period: 03-15-20 to present  Subjective: Pt has been seen for 10 sessions to focus on family education re: swallow safety and best way/s to cue pt for safe and effective POs.   Objective Measurements: Pt's wife stated today pt's frequency of coughing has decreased since initiation of ST.   Goal Update: See above  Plan: d/c next session.  Reason Skilled Services are Required: Ensure wife has adequate training to maintain pt safety with POs.   Problem List Patient Active Problem List   Diagnosis Date Noted  . Benign essential HTN   . Cognitive deficit, post-stroke   . Dysphagia, post-stroke   . Controlled type 2 diabetes mellitus with hyperglycemia, without long-term current use of insulin ([place])   . 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 ([place]) 06/03/2019  . 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 Dyslipidemia, goal LDL below 70 03/12/2019  . Dysphagia due to recent stroke 03/12/2019  . Overweight 03/12/2019  . 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 Abnormality of gait   . Spasticity   . Gait disturbance, post-stroke   . Cerebrovascular accident (CVA) due to occlusion of vertebral artery ([place])   . Essential hypertension 01/31/2016  . 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    [facility] ,[place], [place]  04/21/2020, 12:50 PM  [facility] [address] [place], [place], [address] Phone: [phone]   Fax:  [phone]   Name: Joe ARRONA Sr. MRN: [id number] Date of Birth: [birth year]

## 2020-04-21 NOTE — Patient Instructions (Signed)
You will need to continue to cue [facility] for coughing and swallowing when you hear the "gurgly" voice  You will need to continue to touch his hand when he needs to swallow what is in his mouth before putting more liquid or food in his mouth.  You will need to remind him to take small bites and sips.  Call the adult day care sites Mickel Baas gave you last time, and inquire about how to get [facility] started, if you desire this.

## 2020-04-30 ENCOUNTER — Ambulatory Visit: Payer: Medicare Other

## 2020-04-30 ENCOUNTER — Other Ambulatory Visit: Payer: Self-pay

## 2020-04-30 DIAGNOSIS — R471 Dysarthria and anarthria: Secondary | ICD-10-CM

## 2020-04-30 DIAGNOSIS — R1312 Dysphagia, oropharyngeal phase: Secondary | ICD-10-CM | POA: Diagnosis not present

## 2020-04-30 DIAGNOSIS — R41841 Cognitive communication deficit: Secondary | ICD-10-CM

## 2020-04-30 NOTE — Patient Instructions (Addendum)
  If [place] coughs more frequently or begins to choke during meals or when he drinks, contact the doctor.  I hope they will write a prescription for a swallowing evaluation ("modified barium swallow evaluation").   "Small bite" before every bite "Small sip" before every sip If he is hungry, cut up bites and give him a bite at the appropriate time, in order to keep him from taking large bites and coughing.  Talk as little as you can during mealtime to reduce the distractions from eating.  =======================================================================  Signs of Aspiration Pneumonia  (remember, this occurs due to food or liquid going into the lungs and developing infection)  . Chest pain/tightness . Fever (can be low grade) . Cough  o With foul-smelling phlegm (sputum) o With sputum containing pus or blood o With greenish sputum . Fatigue  . Shortness of breath  . Wheezing   **IF YOU HAVE THESE SIGNS, CONTACT YOUR DOCTOR OR GO TO THE EMERGENCY DEPARTMENT OR URGENT CARE AS SOON AS POSSIBLE**

## 2020-04-30 NOTE — Therapy (Signed)
[place] [address] [place] [address] [place], [place], [address] Phone: [phone]   Fax:  [phone]  Speech Language Pathology Treatment/Discharge Summary  Patient Details  Name: Joe VAILE Sr. MRN: [id number] Date of Birth: 01/06/1951 Referring Provider (SLP): [place]   Encounter Date: 04/30/2020   End of Session - 04/30/20 1738    Visit Number 11    Number of Visits 17    Date for SLP Re-Evaluation 06/11/20    SLP Start Time 0805    SLP Stop Time  0845    SLP Time Calculation (min) 40 min    Activity Tolerance Patient tolerated treatment well           Past Medical History:  Diagnosis Date  . Diabetes mellitus without complication ([place])   . Dysphagia    post stroke  . Hypertension   . Stroke [facility])     Past Surgical History:  Procedure Laterality Date  . IR ANGIO INTRA EXTRACRAN SEL COM CAROTID INNOMINATE UNI L MOD SED  03/10/2019  . IR ANGIO VERTEBRAL SEL SUBCLAVIAN INNOMINATE BILAT MOD SED  03/10/2019  . IR CT HEAD LTD  03/10/2019  . IR PERCUTANEOUS ART THROMBECTOMY/INFUSION INTRACRANIAL INC DIAG ANGIO  03/10/2019  . RADIOLOGY WITH ANESTHESIA N/A 03/10/2019   Procedure: IR WITH ANESTHESIA/CODE STROKE;  Surgeon: Radiologist, Medication, MD;  Location: [facility];  Service: Radiology;  Laterality: N/A;    There were no vitals filed for this visit.   Subjective Assessment - 04/30/20 0825    Subjective Awake and alert today. Pt does not verbally greet SLP upon SLP greeting to pt.    Patient is accompained by: --   wife   Currently in Pain? No/denies                 ADULT SLP TREATMENT - 04/30/20 1727      General Information   Behavior/Cognition Alert;Cooperative;Impulsive;Requires cueing;Doesn't follow directions;Decreased sustained attention      Treatment Provided   Treatment provided Dysphagia      Dysphagia Treatment   Temperature Spikes Noted No    Respiratory Status Room air    Oral Cavity -  Dentition Poor condition;Missing dentition    Treatment Methods Skilled observation;Compensation strategy training;Patient/caregiver education    Patient observed directly with PO's Yes    Type of PO's observed Thin liquids;Dysphagia 3 (soft)    Feeding Able to feed self   pieces cut/preesnted to avoid large bites when hungry   Liquids provided via Cup    Oral Phase Signs & Symptoms Prolonged mastication;Oral holding    Pharyngeal Phase Signs & Symptoms Suspected delayed swallow initiation;Immediate cough   cough x2 on large bites   Type of cueing Verbal;Tactile;Visual    Amount of cueing Minimal   consistent (<80% of the time)   Other treatment/comments "He is hungry" (wife's explanation for pt's large bites). Consistent cues for pt for small bites and sips - pt compliant with small sips likely due to hot liquid. Non compliant with small bites - two largest bites resulted in coughing while pt still masticating. SLP told pt wife she must cut small pieces and present to patient one piece at a time if she wants to avoid pt coughing with food. Wife also with cues to pt to wipe face, clean his nose, while pt chewing and after SLP cueing pt to swallow. SLP again reminded wife to cue pt after pt has swallowed (prior to a bite or sip) to alllow  pt to focus on eating.  SLP expalined overt s/sx of aspiraiton pNA and provided handout again to wife regarding this. Wife told SLP how PNA develops after 10 minutes with min A. SLP reiterated to wife she would cont to need to cue pt for small bites and sips regularly to encourage good pulonary health.      Assessment / Recommendations / Plan   Plan Discharge SLP treatment due to (comment)   wife has been educated to best extent by SLP at this time     Dysphagia Recommendations   Diet recommendations Dysphagia 3 (mechanical soft);Thin liquid   with close supervision   Liquids provided via Cup    Medication Administration Whole meds with liquid    Supervision Full  supervision/cueing for compensatory strategies    Compensations Slow rate;Small sips/bites;Multiple dry swallows after each bite/sip    Postural Changes and/or Swallow Maneuvers Out of bed for meals;Seated upright 90 degrees      Progression Toward Goals   Progression toward goals --   d/c day - see summary           SLP Education - 04/30/20 1737    Education Details need to consistently cue pt for small bites and sips, do not talk mid-bite or mid-sip until pt ready to take next bite or sip-then comment/ask question of pt    Person(s) Educated Patient;Spouse    Methods Explanation;Demonstration    Comprehension Verbalized understanding            SLP Short Term Goals - 04/21/20 1249      SLP SHORT TERM GOAL #1   Title Pt will swallow nectar liquids with external compensations within 8 seconds with usual mod A    Status Deferred   due to pt somnolence/decr'd attention and awareness     SLP SHORT TERM GOAL #2   Title pt wife will demo safe feeding and cueing of patient during POs in 2 sessions    Baseline 04-13-20    Status Partially Met      SLP SHORT TERM GOAL #3   Title in 2 sessions, pt wife will tell SLP how aspiration PNA occurs, so she can notify MD ASAP if she suspects pt with PNA    Baseline 03-31-20, 04-13-20    Status Achieved            SLP Long Term Goals - 04/30/20 1741      SLP LONG TERM GOAL #1   Title in 4 sessions, pt wife will tell SLP how aspiration PNA occurs, so she can notify MD ASAP if she suspects pt with PNA    Baseline 03-31-20, 04-13-20; 04/19/20    Period --   or 13 total sessions   Status Achieved      SLP LONG TERM GOAL #2   Title Pt will swallow liqiuds within 6 seconds with occasional min A over 2 sessions    Period Weeks    Status Deferred   due to pt somnolence/decr'd attention and awareness     SLP LONG TERM GOAL #3   Title pt wife will demo safe feeding and cueing of patient during POs in 4 sessions    Baseline 04-13-20; 04-19-20,  04-21-20    Status Partially Met      SLP LONG TERM GOAL #4   Title pt will undergo furhter testing of language or cognition PRN    Time 4    Period Weeks    Status Deferred  Plan - 04/30/20 1738    Clinical Impression Statement [name] continues to present with primariliy cognitively-based oropharyngeal dysphagia. Timeliness of oral phase and swallow initiation somewhat correlates with level of alertness (less alert - longer intitation times, or more alert - min to mod initiation times). See "skilled intervention" for details. Pt's wife was not as good today with appropriate cueing. ? her degree of success she has at home with guiding pt to safe swallow strategies, as these strategies have been modeled and told/suggested to her in many ST sessions.    Treatment/Interventions Aspiration precaution training;Language facilitation;Environmental controls;Cueing hierarchy;Oral motor exercises;SLP instruction and feedback;Compensatory strategies;Functional tasks;Cognitive reorganization;Compensatory techniques;Diet toleration management by SLP;Trials of upgraded texture/liquids;Internal/external aids;Multimodal communcation approach;Patient/family education    Potential to Achieve Goals Fair    Potential Considerations Cooperation/participation level;Severity of impairments;Previous level of function           Patient will benefit from skilled therapeutic intervention in order to improve the following deficits and impairments:   Dysphagia, oropharyngeal phase  Cognitive communication deficit  Dysarthria and anarthria   SPEECH THERAPY DISCHARGE SUMMARY  Visits from Start of Care: 11  Current functional level related to goals / functional outcomes: See above. Wife continues to require to give consistent cues for pt's cognitively-based dysphagia. Her level and amount of cueing is sometimes too high and distracts pt. SLP has attempted to encourage wife to decr frequency of cueing,  and teach timing (between bites or sips, not during).   Remaining deficits: All deficits remain.   Education / Equipment: How to cue pt, how to encourage portion control, "wet" voice and ramifications, what to do if pt has "wet" voice, swallow precautions.   Plan: Patient agrees to discharge.  Patient goals were partially met. Patient is being discharged due to                                                     ?????pt has reached potential, currently.       Problem List Patient Active Problem List   Diagnosis Date Noted  . Benign essential HTN   . Cognitive deficit, post-stroke   . Dysphagia, post-stroke   . Controlled type 2 diabetes mellitus with hyperglycemia, without long-term current use of insulin ([place])   . 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 ([place]) 06/03/2019  . 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 Dyslipidemia, goal LDL below 70 03/12/2019  . Dysphagia due to recent stroke 03/12/2019  . Overweight 03/12/2019  . 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 Abnormality of gait   . Spasticity   . Gait disturbance, post-stroke   . Cerebrovascular accident (CVA) due to occlusion of vertebral artery ([place])   . Essential hypertension 01/31/2016  . 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    [facility] 04/30/2020, 5:42 PM  [facility] [address] [place], [place], [address] Phone: [phone]   Fax:  [phone]   Name: Joe EMPSON Sr. MRN: [id number] Date of Birth: 23-Jul-1951

## 2020-05-25 ENCOUNTER — Ambulatory Visit (HOSPITAL_BASED_OUTPATIENT_CLINIC_OR_DEPARTMENT_OTHER): Payer: Medicare Other | Admitting: Pharmacist

## 2020-05-25 ENCOUNTER — Encounter: Payer: Self-pay | Admitting: Internal Medicine

## 2020-05-25 ENCOUNTER — Ambulatory Visit: Payer: Medicare Other | Attending: Internal Medicine | Admitting: Internal Medicine

## 2020-05-25 VITALS — BP 151/86 | HR 72 | Temp 98.6°F | Resp 16 | Wt 181.8 lb

## 2020-05-25 DIAGNOSIS — E1159 Type 2 diabetes mellitus with other circulatory complications: Secondary | ICD-10-CM

## 2020-05-25 DIAGNOSIS — I10 Essential (primary) hypertension: Secondary | ICD-10-CM

## 2020-05-25 DIAGNOSIS — Z23 Encounter for immunization: Secondary | ICD-10-CM

## 2020-05-25 DIAGNOSIS — I6930 Unspecified sequelae of cerebral infarction: Secondary | ICD-10-CM

## 2020-05-25 MED ORDER — METFORMIN HCL 500 MG PO TABS: 250.0000 mg | ORAL_TABLET | Freq: Every day | ORAL | 3 refills | Status: DC

## 2020-05-25 NOTE — Progress Notes (Signed)
Patient ID: Joe Haig., male    DOB: 06/13/1951  MRN: [id number]  CC: Hypertension   Subjective: Joe Perry is a 69 y.o. male who presents for follow-up visit.  I have not seen him before.  PCP is Dr. Chapman Fitch.  Wife, Jack Quarto, is with him and gives most of the hx His concerns today include:  Patient with history of CVA (03/2019) with residual deficits including abnormality of gait and balance, cognitive impairment and post stroke dysphagia; DM type II, HTN, HL  CVA:  Completed speech and physical therapy.  Pt speaks infrequently, prefers to motion with head movements and makes granting sounds in the throat.  Wife says she encourages him to get words out.  Using standard walker.  No falls.  Able to dress, feed, toilet and bath self.  Has a shower chair.   Has to swallow liquids slowly.  Thick liquids recommended by therapist per pt's wife but pt prefers to drink regular fluids  HTN:  Takes Norvasc in evenings.  Has home BP monitoring device.  This a.m was 135/81.  Usually 118-120s/75-80 Limits salt in foods Eating fresh fruits and veggies  DM:  Diet control.  Wife checks BS every morning.  Range 112-120      Patient Active Problem List   Diagnosis Date Noted  . Benign essential HTN   . Cognitive deficit, post-stroke   . Dysphagia, post-stroke   . Controlled type 2 diabetes mellitus with hyperglycemia, without long-term current use of insulin ([place])   . 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 Dyslipidemia, goal LDL below 70 03/12/2019  . Dysphagia due to recent stroke 03/12/2019  . Overweight 03/12/2019  . 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 Abnormality of gait   . Spasticity     . Gait disturbance, post-stroke   . Cerebrovascular accident (CVA) due to occlusion of vertebral artery ([place])   . Essential hypertension 01/31/2016  . 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     Current Outpatient Medications on File Prior to Visit  Medication Sig Dispense Refill  . acetaminophen (TYLENOL) 325 MG tablet Take 2 tablets (650 mg total) by mouth every 4 (four) hours as needed for mild pain (or temp > 37.5 C (99.5 F)).    Marland Kitchen amLODipine (NORVASC) 5 MG tablet Take 1 tablet (5 mg total) by mouth daily. To lower blood pressure 90 tablet 2  . aspirin EC 81 MG tablet Take 1 tablet (81 mg total) by mouth daily. 30 tablet 3  . atorvastatin (LIPITOR) 40 MG tablet One daily in the evening to lower cholesterol 90 tablet 2  . donepezil (ARICEPT) 10 MG tablet TAKE 1 TABLET BY MOUTH AT BEDTIME 90 tablet 0  . furosemide (LASIX) 20 MG tablet Take 1 tablet (20 mg total) by mouth daily. 10 tablet 0  . QUEtiapine (SEROQUEL) 25 MG tablet Take 2 tablets (50 mg total) by mouth at bedtime. (Patient taking differently: Take 50 mg by mouth at bedtime. Wife reports that pt is taking 1 pill, 2x/day) 60 tablet 3   No current facility-administered medications on file prior to visit.    No Known Allergies  Social History   Socioeconomic History  . Marital status: Married    Spouse name: [name]  . Number of  children: Not on file  . Years of education: Not on file  . Highest education level: Not on file  Occupational History  . Not on file  Tobacco Use  . Smoking status: Never Smoker  . Smokeless tobacco: Never Used  Vaping Use  . Vaping Use: Never used  Substance and Sexual Activity  . Alcohol use: No  . Drug use: No  . Sexual activity: Not Currently  Other Topics Concern  . Not on file  Social History Narrative   12/16/19 Lives with wife   Social Determinants of Health   Financial Resource Strain:   . Difficulty of Paying Living Expenses: Not on file  Food Insecurity:   . Worried About Charity fundraiser in  the Last Year: Not on file  . Ran Out of Food in the Last Year: Not on file  Transportation Needs:   . Lack of Transportation (Medical): Not on file  . Lack of Transportation (Non-Medical): Not on file  Physical Activity:   . Days of Exercise per Week: Not on file  . Minutes of Exercise per Session: Not on file  Stress:   . Feeling of Stress : Not on file  Social Connections:   . Frequency of Communication with Friends and Family: Not on file  . Frequency of Social Gatherings with Friends and Family: Not on file  . Attends Religious Services: Not on file  . Active Member of Clubs or Organizations: Not on file  . Attends Archivist Meetings: Not on file  . Marital Status: Not on file  Intimate Partner Violence:   . Fear of Current or Ex-Partner: Not on file  . Emotionally Abused: Not on file  . Physically Abused: Not on file  . Sexually Abused: Not on file    Family History  Problem Relation Age of Onset  . Diabetes Other   . Hypertension Other   . Healthy Mother   . Healthy Father     Past Surgical History:  Procedure Laterality Date  . IR ANGIO INTRA EXTRACRAN SEL COM CAROTID INNOMINATE UNI L MOD SED  03/10/2019  . IR ANGIO VERTEBRAL SEL SUBCLAVIAN INNOMINATE BILAT MOD SED  03/10/2019  . IR CT HEAD LTD  03/10/2019  . IR PERCUTANEOUS ART THROMBECTOMY/INFUSION INTRACRANIAL INC DIAG ANGIO  03/10/2019  . RADIOLOGY WITH ANESTHESIA N/A 03/10/2019   Procedure: IR WITH ANESTHESIA/CODE STROKE;  Surgeon: Radiologist, Medication, MD;  Location: [facility];  Service: Radiology;  Laterality: N/A;    ROS: Review of Systems Negative except as stated above  PHYSICAL EXAM: BP (!) 151/86   Pulse 72   Temp 98.6 F (37 C)   Resp 16   Wt 181 lb 12.8 oz (82.5 kg)   SpO2 97%   BMI 24.66 kg/m   Physical Exam Repeat BP 135/80 General appearance - alert, well appearing, older African male and in no distress Mental status -patient is alert.  He tries to make vocalizations while nodding  his head yes or no. Neck - supple, no significant adenopathy Chest - clear to auscultation, no wheezes, rales or rhonchi, symmetric air entry Heart - normal rate, regular rhythm, normal S1, S2, no murmurs, rubs, clicks or gallops Neurological -patient has an expressive aphasia.  Decreased power in the left upper and lower extremities compared to the right.  He ambulates with a walker. Extremities -no lower extremity edema. CMP Latest Ref Rng & Units 03/22/2020 01/14/2020 10/15/2019  Glucose 70 - 99 mg/dL 139(H) 67 137(H)  BUN  8 - 23 mg/dL 13 13 14   Creatinine 0.61 - 1.24 mg/dL 0.82 0.74(L) 0.81  Sodium 135 - 145 mmol/L 140 143 144  Potassium 3.5 - 5.1 mmol/L 3.9 4.7 4.9  Chloride 98 - 111 mmol/L 103 102 105  CO2 22 - 32 mmol/L 30 25 23   Calcium 8.9 - 10.3 mg/dL 9.6 9.9 9.8  Total Protein 6.5 - 8.1 g/dL 7.8 - 6.7  Total Bilirubin 0.3 - 1.2 mg/dL 1.0 - 0.4  Alkaline Phos 38 - 126 U/L 69 - 64  AST 15 - 41 U/L 20 - 13  ALT 0 - 44 U/L 32 - 10   Lipid Panel     Component Value Date/Time   CHOL 159 03/11/2019 0946   TRIG 67 03/11/2019 0946   HDL 45 03/11/2019 0946   CHOLHDL 3.5 03/11/2019 0946   VLDL 13 03/11/2019 0946   LDLCALC 101 (H) 03/11/2019 0946    CBC    Component Value Date/Time   WBC 5.9 03/22/2020 1938   RBC 4.10 (L) 03/22/2020 1938   HGB 12.9 (L) 03/22/2020 1938   HGB 13.5 09/03/2019 1147   HCT 39.6 03/22/2020 1938   HCT 41.4 09/03/2019 1147   PLT 228 03/22/2020 1938   PLT 235 09/03/2019 1147   MCV 96.6 03/22/2020 1938   MCV 92 09/03/2019 1147   MCH 31.5 03/22/2020 1938   MCHC 32.6 03/22/2020 1938   RDW 12.5 03/22/2020 1938   RDW 12.3 09/03/2019 1147   LYMPHSABS 2.5 09/03/2019 1147   MONOABS 0.6 06/04/2019 0541   EOSABS 0.1 09/03/2019 1147   BASOSABS 0.1 09/03/2019 1147   A1C 6.6  ASSESSMENT AND PLAN: 1. Type 2 diabetes mellitus with other circulatory complication, without long-term current use of insulin ([name]) -A1C still at goal but increase some from last  visit.  Will start him on very low dose of Metformin 250 mg daily Continue healthy eating habits - POCT glucose (manual entry) - POCT glycosylated hemoglobin (Hb A1C) - metFORMIN (GLUCOPHAGE) 500 MG tablet; Take 0.5 tablets (250 mg total) by mouth daily with breakfast.  Dispense: 30 tablet; Refill: 3  2. Essential hypertension Close to goal.  However, wife reports BP readings at home have been at goal.  Continue Norvasc 5 mg.  Goal is 130/80 or lower.  Wife will cont to check BP at home and let us know if BP runs above goal  3. History of CVA with residual deficit -continue ASA, Lipitor and Norvasc.  [place] pt to practice vocalizations as taught by speech therapist.  Drink thicken liquids if he has problems with thin liquids  4. Need for influenza vaccination Given today    Patient was given the opportunity to ask questions.  Patient verbalized understanding of the plan and was able to repeat key elements of the plan.   Orders Placed This Encounter  Procedures  . POCT glucose (manual entry)  . POCT glycosylated hemoglobin (Hb A1C)     Requested Prescriptions   Signed Prescriptions Disp Refills  . metFORMIN (GLUCOPHAGE) 500 MG tablet 30 tablet 3    Sig: Take 0.5 tablets (250 mg total) by mouth daily with breakfast.    Return in about 3 months (around 08/24/2020) for Dr. Chapman Fitch.  Karle Plumber, MD, FACP

## 2020-05-25 NOTE — Progress Notes (Signed)
Patient presents for vaccination against influenza per orders of Dr. Johnson. Consent given. Counseling provided. No contraindications exists. Vaccine administered without incident.  ° °Luke Van Ausdall, PharmD, CPP °Clinical Pharmacist °[facility] °[phone] ° °

## 2020-05-25 NOTE — Patient Instructions (Addendum)
Continue to monitor your blood pressure.  Goal is 130/80 or lower.  If you start to run higher than that, please let us know so that your blood pressure medicine can be adjusted.  We have added Metformin 500 mg half a tablet daily for the diabetes..  Influenza Virus Vaccine injection (Fluarix) What is this medicine? INFLUENZA VIRUS VACCINE (in floo EN zuh VAHY ruhs vak SEEN) helps to reduce the risk of getting influenza also known as the flu. This medicine may be used for other purposes; ask your health care provider or pharmacist if you have questions. COMMON BRAND NAME(S): Fluarix, Fluzone What should I tell my health care provider before I take this medicine? They need to know if you have any of these conditions:  bleeding disorder like hemophilia  fever or infection  Guillain-Barre syndrome or other neurological problems  immune system problems  infection with the human immunodeficiency virus (HIV) or AIDS  low blood platelet counts  multiple sclerosis  an unusual or allergic reaction to influenza virus vaccine, eggs, chicken proteins, latex, gentamicin, other medicines, foods, dyes or preservatives  pregnant or trying to get pregnant  breast-feeding How should I use this medicine? This vaccine is for injection into a muscle. It is given by a health care professional. A copy of Vaccine Information Statements will be given before each vaccination. Read this sheet carefully each time. The sheet may change frequently. Talk to your pediatrician regarding the use of this medicine in children. Special care may be needed. Overdosage: If you think you have taken too much of this medicine contact a poison control center or emergency room at once. NOTE: This medicine is only for you. Do not share this medicine with others. What if I miss a dose? This does not apply. What may interact with this medicine?  chemotherapy or radiation therapy  medicines that lower your immune system  like etanercept, anakinra, infliximab, and adalimumab  medicines that treat or prevent blood clots like warfarin  phenytoin  steroid medicines like prednisone or cortisone  theophylline  vaccines This list may not describe all possible interactions. Give your health care provider a list of all the medicines, herbs, non-prescription drugs, or dietary supplements you use. Also tell them if you smoke, drink alcohol, or use illegal drugs. Some items may interact with your medicine. What should I watch for while using this medicine? Report any side effects that do not go away within 3 days to your doctor or health care professional. Call your health care provider if any unusual symptoms occur within 6 weeks of receiving this vaccine. You may still catch the flu, but the illness is not usually as bad. You cannot get the flu from the vaccine. The vaccine will not protect against colds or other illnesses that may cause fever. The vaccine is needed every year. What side effects may I notice from receiving this medicine? Side effects that you should report to your doctor or health care professional as soon as possible:  allergic reactions like skin rash, itching or hives, swelling of the face, lips, or tongue Side effects that usually do not require medical attention (report to your doctor or health care professional if they continue or are bothersome):  fever  headache  muscle aches and pains  pain, tenderness, redness, or swelling at site where injected  weak or tired This list may not describe all possible side effects. Call your doctor for medical advice about side effects. You may report side effects to  FDA at 1-800-FDA-1088. Where should I keep my medicine? This vaccine is only given in a clinic, pharmacy, doctor's office, or other health care setting and will not be stored at home. NOTE: This sheet is a summary. It may not cover all possible information. If you have questions about this  medicine, talk to your doctor, pharmacist, or health care provider.  2020 Elsevier/Gold Standard (2008-03-18 09:30:40)

## 2020-05-26 LAB — POCT GLYCOSYLATED HEMOGLOBIN (HGB A1C): HbA1c, POC (controlled diabetic range): 6.6 % (ref 0.0–7.0)

## 2020-05-26 LAB — GLUCOSE, POCT (MANUAL RESULT ENTRY): POC Glucose: 166 mg/dl — AB (ref 70–99)

## 2020-06-04 ENCOUNTER — Other Ambulatory Visit: Payer: Self-pay

## 2020-06-04 ENCOUNTER — Encounter (HOSPITAL_COMMUNITY): Payer: Self-pay

## 2020-06-04 ENCOUNTER — Ambulatory Visit (HOSPITAL_COMMUNITY)
Admission: EM | Admit: 2020-06-04 | Discharge: 2020-06-04 | Disposition: A | Discharge status: Home / Self Care | Payer: Medicare Other | Attending: Family Medicine | Admitting: Family Medicine

## 2020-06-04 DIAGNOSIS — Z20822 Contact with and (suspected) exposure to covid-19: Secondary | ICD-10-CM | POA: Diagnosis not present

## 2020-06-04 DIAGNOSIS — J069 Acute upper respiratory infection, unspecified: Secondary | ICD-10-CM | POA: Diagnosis not present

## 2020-06-04 DIAGNOSIS — E119 Type 2 diabetes mellitus without complications: Secondary | ICD-10-CM | POA: Diagnosis not present

## 2020-06-04 DIAGNOSIS — I10 Essential (primary) hypertension: Secondary | ICD-10-CM | POA: Insufficient documentation

## 2020-06-04 DIAGNOSIS — R059 Cough, unspecified: Secondary | ICD-10-CM | POA: Insufficient documentation

## 2020-06-04 DIAGNOSIS — R1110 Vomiting, unspecified: Secondary | ICD-10-CM | POA: Diagnosis not present

## 2020-06-04 DIAGNOSIS — Z8673 Personal history of transient ischemic attack (TIA), and cerebral infarction without residual deficits: Secondary | ICD-10-CM | POA: Insufficient documentation

## 2020-06-04 MED ORDER — ONDANSETRON 4 MG PO TBDP
4.0000 mg | ORAL_TABLET | Freq: Three times a day (TID) | ORAL | 0 refills | Status: DC | PRN
Start: 2020-06-04 — End: 2020-08-03

## 2020-06-04 MED ORDER — HYDROCODONE-HOMATROPINE 5-1.5 MG/5ML PO SYRP: 5.0000 mL | ORAL_SOLUTION | Freq: Four times a day (QID) | ORAL | 0 refills | Status: DC | PRN

## 2020-06-04 NOTE — ED Triage Notes (Signed)
Pt present a cough with vomiting. This has been going on off an on for the patient. Pt has been vaccinated

## 2020-06-05 ENCOUNTER — Ambulatory Visit (HOSPITAL_COMMUNITY)
Admission: EM | Admit: 2020-06-05 | Discharge: 2020-06-05 | Disposition: A | Discharge status: Home / Self Care | Payer: Medicare Other | Attending: Family Medicine | Admitting: Family Medicine

## 2020-06-05 ENCOUNTER — Encounter (HOSPITAL_COMMUNITY): Payer: Self-pay

## 2020-06-05 ENCOUNTER — Other Ambulatory Visit: Payer: Self-pay

## 2020-06-05 DIAGNOSIS — B349 Viral infection, unspecified: Secondary | ICD-10-CM

## 2020-06-05 DIAGNOSIS — R1110 Vomiting, unspecified: Secondary | ICD-10-CM

## 2020-06-05 DIAGNOSIS — R059 Cough, unspecified: Secondary | ICD-10-CM

## 2020-06-05 LAB — SARS CORONAVIRUS 2 (TAT 6-24 HRS): SARS Coronavirus 2: NEGATIVE

## 2020-06-05 MED ORDER — GUAIFENESIN-CODEINE 100-10 MG/5ML PO SYRP: 5.0000 mL | ORAL_SOLUTION | Freq: Three times a day (TID) | ORAL | 0 refills | Status: DC | PRN

## 2020-06-05 NOTE — Discharge Instructions (Addendum)
I have sent in cheratussin to your pharmacy for you to take for cough  Use the zofran as prescribed  If you are unable to keep down food or liquids, follow up with the ER

## 2020-06-05 NOTE — ED Provider Notes (Signed)
[facility]    CSN: [id number] Arrival date & time: 06/05/20  1700      History   Chief Complaint Chief Complaint  Patient presents with  . Follow-up    HPI Joe NOREN Sr. is a 69 y.o. male.   Reports that the patient was seen in this office yesterday for cough and post tussive vomiting. Reports that they never heard from the  Pharmacy and did not pick up medications that were sent. Reports that she thinks he needs his urine checked, there may be blood present. States that he is incontinent and already voided, but can obtain sample at home and bring it back in the morning. Has not attempted OTC treatment. There are no aggravating or alleviating factors. Denies headache, sore throat, diarrhea, fever, rash, other symptoms.  ROS per HPI  The history is provided by the spouse and the patient.    Past Medical History:  Diagnosis Date  . Diabetes mellitus without complication ([place])   . Dysphagia    post stroke  . Hypertension   . Stroke [facility])     Patient Active Problem List   Diagnosis Date Noted  . Benign essential HTN   . Cognitive deficit, post-stroke   . Dysphagia, post-stroke   . Controlled type 2 diabetes mellitus with hyperglycemia, without long-term current use of insulin ([place])   . 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 Dyslipidemia, goal LDL below 70 03/12/2019  . Dysphagia due to recent stroke 03/12/2019  . Overweight 03/12/2019  . 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 Abnormality of gait   . Spasticity   . Gait disturbance, post-stroke   . Cerebrovascular accident (CVA) due to occlusion of vertebral artery ([place])   . Essential hypertension 01/31/2016   . 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    Past Surgical History:  Procedure Laterality Date  . IR ANGIO INTRA EXTRACRAN SEL COM CAROTID INNOMINATE UNI L MOD SED  03/10/2019  . IR ANGIO VERTEBRAL SEL SUBCLAVIAN INNOMINATE BILAT MOD SED  03/10/2019  . IR CT HEAD LTD  03/10/2019  . IR PERCUTANEOUS ART THROMBECTOMY/INFUSION INTRACRANIAL INC DIAG ANGIO  03/10/2019  . RADIOLOGY WITH ANESTHESIA N/A 03/10/2019   Procedure: IR WITH ANESTHESIA/CODE STROKE;  Surgeon: Radiologist, Medication, MD;  Location: [facility];  Service: Radiology;  Laterality: N/A;       Home Medications    Prior to Admission medications   Medication Sig Start Date End Date Taking? Authorizing Provider  acetaminophen (TYLENOL) 325 MG tablet Take 2 tablets (650 mg total) by mouth every 4 (four) hours as needed for mild pain (or temp > 37.5 C (99.5 F)). 06/19/19   Angiulli, Lavon Paganini, PA-C  amLODipine (NORVASC) 5 MG tablet Take 1 tablet (5 mg total) by mouth daily. To lower blood pressure 01/14/20   Fulp, Cammie, MD  aspirin EC 81 MG tablet Take 1 tablet (81 mg total) by mouth daily. 09/08/13   Reyne Dumas, MD  atorvastatin (LIPITOR) 40 MG tablet One daily in the evening to lower cholesterol 01/14/20   Fulp, Cammie, MD  donepezil (ARICEPT) 10 MG tablet TAKE 1 TABLET BY MOUTH AT BEDTIME 04/03/20   Fulp, Cammie, MD  furosemide (LASIX) 20 MG tablet Take 1 tablet (20 mg total) by mouth daily. 03/22/20  Raylene Everts, MD  guaiFENesin-codeine [facility]) 100-10 MG/5ML syrup Take 5 mLs by mouth 3 (three) times daily as needed for cough. 06/05/20   Faustino Congress, NP  metFORMIN (GLUCOPHAGE) 500 MG tablet Take 0.5 tablets (250 mg total) by mouth daily with breakfast. 05/25/20   Ladell Pier, MD  ondansetron (ZOFRAN-ODT) 4 MG disintegrating tablet Take 1 tablet (4 mg total) by mouth every 8 (eight) hours as needed for nausea or vomiting. 06/04/20   Vanessa Kick, MD  QUEtiapine (SEROQUEL) 25 MG tablet Take 2 tablets (50 mg total) by mouth at  bedtime. Patient taking differently: Take 50 mg by mouth at bedtime. Wife reports that pt is taking 1 pill, 2x/day 09/03/19   Elsie Stain, MD    Family History Family History  Problem Relation Age of Onset  . Diabetes Other   . Hypertension Other   . Healthy Mother   . Healthy Father     Social History Social History   Tobacco Use  . Smoking status: Never Smoker  . Smokeless tobacco: Never Used  Vaping Use  . Vaping Use: Never used  Substance Use Topics  . Alcohol use: No  . Drug use: No     Allergies   Patient has no known allergies.   Review of Systems Review of Systems   Physical Exam Triage Vital Signs ED Triage Vitals  Enc Vitals Group     BP 06/05/20 1717 (!) 154/78     Pulse Rate 06/05/20 1717 94     Resp 06/05/20 1717 16     Temp 06/05/20 1717 99.5 F (37.5 C)     Temp Source 06/05/20 1717 Oral     SpO2 06/05/20 1717 97 %     Weight --      Height --      Head Circumference --      Peak Flow --      Pain Score 06/05/20 1721 0     Pain Loc --      Pain Edu? --      Excl. in [place]? --    No data found.  Updated Vital Signs BP (!) 154/78 (BP Location: Right Arm)   Pulse 94   Temp 99.5 F (37.5 C) (Oral)   Resp 16   SpO2 97%   Visual Acuity Right Eye Distance:   Left Eye Distance:   Bilateral Distance:    Right Eye Near:   Left Eye Near:    Bilateral Near:        UC Treatments / Results  Labs (all labs ordered are listed, but only abnormal results are displayed) Labs Reviewed - No data to display  EKG   Radiology   Procedures Procedures (including critical care time)  Medications Ordered in UC Medications - No data to display  Initial Impression / Assessment and Plan / UC Course  I have reviewed the triage vital signs and the nursing notes.  Pertinent labs & imaging results that were available during my care of the patient were reviewed by me and considered in my medical decision making (see chart for  details).     Viral Illness Cough Vomiting Urinary Concern  Presents for cough and post tussive vomiting Also concerned for UTI, unable to provide sample in office today, sent home with specimen cup and instructed to bring back tomorrow. Spoke with [facility] and they did not have Hycodan cough syrup, changed to Cheratussin which was in stock. Zofran was in stock. Discussed  with wife that the medications should be ready and available for them when they got to the pharmacy.  Discussed that if symptoms are acutely worsening, to follow up in the ER Final Clinical Impressions(s) / UC Diagnoses   Final diagnoses:  Cough  Viral illness  Post-tussive emesis     Discharge Instructions     I have sent in cheratussin to your pharmacy for you to take for cough  Use the zofran as prescribed  If you are unable to keep down food or liquids, follow up with the ER      ED Prescriptions    Medication Sig Dispense Auth. Provider   guaiFENesin-codeine (ROBITUSSIN AC) 100-10 MG/5ML syrup Take 5 mLs by mouth 3 (three) times daily as needed for cough. 120 mL Faustino Congress, NP     PDMP not reviewed this encounter.   Faustino Congress, NP 06/07/20 1138

## 2020-06-05 NOTE — ED Triage Notes (Signed)
Pt presents for follow up from yesterdays visit; pt caregiver says pt is still vomiting and weak.  Pt did not have any medication that was prescribed to him yesterday, caretaker states she did not receive a call from pharmacy so she did not pick it up.

## 2020-06-07 ENCOUNTER — Other Ambulatory Visit: Payer: Self-pay

## 2020-06-07 ENCOUNTER — Emergency Department (HOSPITAL_COMMUNITY)
Admission: EM | Admit: 2020-06-07 | Discharge: 2020-06-07 | Disposition: A | Discharge status: Home / Self Care | Payer: Medicare Other | Attending: Emergency Medicine | Admitting: Emergency Medicine

## 2020-06-07 ENCOUNTER — Emergency Department (HOSPITAL_COMMUNITY): Payer: Medicare Other

## 2020-06-07 DIAGNOSIS — Z79899 Other long term (current) drug therapy: Secondary | ICD-10-CM | POA: Insufficient documentation

## 2020-06-07 DIAGNOSIS — R1110 Vomiting, unspecified: Secondary | ICD-10-CM | POA: Insufficient documentation

## 2020-06-07 DIAGNOSIS — N3001 Acute cystitis with hematuria: Secondary | ICD-10-CM | POA: Diagnosis not present

## 2020-06-07 DIAGNOSIS — Z7984 Long term (current) use of oral hypoglycemic drugs: Secondary | ICD-10-CM | POA: Insufficient documentation

## 2020-06-07 DIAGNOSIS — I10 Essential (primary) hypertension: Secondary | ICD-10-CM | POA: Insufficient documentation

## 2020-06-07 DIAGNOSIS — Z7982 Long term (current) use of aspirin: Secondary | ICD-10-CM | POA: Diagnosis not present

## 2020-06-07 DIAGNOSIS — E1165 Type 2 diabetes mellitus with hyperglycemia: Secondary | ICD-10-CM | POA: Diagnosis not present

## 2020-06-07 DIAGNOSIS — R059 Cough, unspecified: Secondary | ICD-10-CM | POA: Insufficient documentation

## 2020-06-07 DIAGNOSIS — R4781 Slurred speech: Secondary | ICD-10-CM | POA: Diagnosis not present

## 2020-06-07 DIAGNOSIS — Z20822 Contact with and (suspected) exposure to covid-19: Secondary | ICD-10-CM | POA: Insufficient documentation

## 2020-06-07 DIAGNOSIS — R509 Fever, unspecified: Secondary | ICD-10-CM | POA: Insufficient documentation

## 2020-06-07 DIAGNOSIS — Z743 Need for continuous supervision: Secondary | ICD-10-CM | POA: Diagnosis not present

## 2020-06-07 DIAGNOSIS — R0602 Shortness of breath: Secondary | ICD-10-CM | POA: Diagnosis not present

## 2020-06-07 DIAGNOSIS — R404 Transient alteration of awareness: Secondary | ICD-10-CM | POA: Diagnosis not present

## 2020-06-07 DIAGNOSIS — G459 Transient cerebral ischemic attack, unspecified: Secondary | ICD-10-CM | POA: Diagnosis not present

## 2020-06-07 DIAGNOSIS — I517 Cardiomegaly: Secondary | ICD-10-CM | POA: Diagnosis not present

## 2020-06-07 LAB — BASIC METABOLIC PANEL
Anion gap: 10 (ref 5–15)
BUN: 13 mg/dL (ref 8–23)
CO2: 29 mmol/L (ref 22–32)
Calcium: 9.8 mg/dL (ref 8.9–10.3)
Chloride: 103 mmol/L (ref 98–111)
Creatinine, Ser: 1.17 mg/dL (ref 0.61–1.24)
GFR calc Af Amer: >60 mL/min (ref >60)
GFR calc non Af Amer: >60 mL/min (ref >60)
Glucose, Bld: 201 mg/dL — ABNORMAL HIGH (ref 70–99)
Potassium: 3.7 mmol/L (ref 3.5–5.1)
Sodium: 142 mmol/L (ref 135–145)

## 2020-06-07 LAB — CBC WITH DIFFERENTIAL/PLATELET
Abs Immature Granulocytes: 0.02 10*3/uL (ref 0.00–0.07)
Basophils Absolute: 0 10*3/uL (ref 0.0–0.1)
Basophils Relative: 0 %
Eosinophils Absolute: 0 10*3/uL (ref 0.0–0.5)
Eosinophils Relative: 0 %
HCT: 37.6 % — ABNORMAL LOW (ref 39.0–52.0)
Hemoglobin: 12.2 g/dL — ABNORMAL LOW (ref 13.0–17.0)
Immature Granulocytes: 0 %
Lymphocytes Relative: 25 %
Lymphs Abs: 2 10*3/uL (ref 0.7–4.0)
MCH: 30.5 pg (ref 26.0–34.0)
MCHC: 32.4 g/dL (ref 30.0–36.0)
MCV: 94 fL (ref 80.0–100.0)
Monocytes Absolute: 1.2 10*3/uL — ABNORMAL HIGH (ref 0.1–1.0)
Monocytes Relative: 14 %
Neutro Abs: 4.9 10*3/uL (ref 1.7–7.7)
Neutrophils Relative %: 61 %
Platelets: 232 10*3/uL (ref 150–400)
RBC: 4 MIL/uL — ABNORMAL LOW (ref 4.22–5.81)
RDW: 12 % (ref 11.5–15.5)
WBC: 8.1 10*3/uL (ref 4.0–10.5)
nRBC: 0 % (ref 0.0–0.2)

## 2020-06-07 LAB — URINALYSIS, ROUTINE W REFLEX MICROSCOPIC
Bilirubin Urine: NEGATIVE
Glucose, UA: NEGATIVE mg/dL
Ketones, ur: NEGATIVE mg/dL
Nitrite: NEGATIVE
Protein, ur: 100 mg/dL — AB
Specific Gravity, Urine: 1.01 (ref 1.005–1.030)
WBC, UA: >50 WBC/hpf — ABNORMAL HIGH (ref 0–5)
pH: 6 (ref 5.0–8.0)

## 2020-06-07 MED ORDER — CEFDINIR 300 MG PO CAPS: 300.0000 mg | ORAL_CAPSULE | Freq: Two times a day (BID) | ORAL | 0 refills | Status: AC

## 2020-06-07 MED ORDER — SODIUM CHLORIDE 0.9 % IV BOLUS: 250.0000 mL | Freq: Once | INTRAVENOUS | Status: DC

## 2020-06-07 NOTE — ED Notes (Signed)
Bladder scan = 0ML. Pt began to urinate while completing bladder scan.

## 2020-06-07 NOTE — Discharge Instructions (Addendum)
There is evidence of urinary infection on the urinalysis.  A urine culture is pending.  We also suspect development of respiratory infection.  This could still be viral, however, due to the persistence of symptoms and with a negative Covid test, likely decided to add antibiotic coverage.  Please take all of your antibiotics until finished!   You may develop abdominal discomfort or diarrhea from the antibiotic.  You may help offset this with probiotics which you can buy or get in yogurt. Do not eat or take the probiotics until 2 hours after your antibiotic.   We are highly recommend following up with your primary care provider on this matter for any further management.  Follow up with primary care provider for further evaluation of possible pulmonary effusion.

## 2020-06-07 NOTE — ED Provider Notes (Signed)
[name]GENC[facility] DEPARTMENT Provider Note   CSN: [id number] Arrival date & time: 06/07/20  0202     History Chief Complaint  Patient presents with  . Fever  . Cough    Joe Eddy Sr. is a 69 y.o. male.  The history is provided by the patient and the spouse.        Joe Eddy Sr. is a 69 y.o. male, with a history of DM, dysphagia from stroke, HTN, stroke, presenting to the ED with cough for the past week.  Accompanied by occasional shortness of breath, fever of 100.5, post-tussive emesis.  Last antipyretic dose yesterday. Wife reports patient will sometimes hold or push his suprapubic region, but also states this has been occurring since he had his stroke.  Denies lower extremity edema/pain, abdominal pain, other vomiting, nausea, diarrhea, urinary symptoms, falls/trauma, orthopnea, or any other complaints.   Past Medical History:  Diagnosis Date  . Diabetes mellitus without complication ([place])   . Dysphagia    post stroke  . Hypertension   . Stroke [facility])     Patient Active Problem List   Diagnosis Date Noted  . Benign essential HTN   . Cognitive deficit, post-stroke   . Dysphagia, post-stroke   . Controlled type 2 diabetes mellitus with hyperglycemia, without long-term current use of insulin ([place])   . 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 Dyslipidemia, goal LDL below 70 03/12/2019  . Dysphagia due to recent stroke 03/12/2019  . Overweight 03/12/2019  . 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 Abnormality of gait   . Spasticity   . Gait disturbance, post-stroke   . Cerebrovascular accident (CVA) due to occlusion of vertebral artery ([place])     . Essential hypertension 01/31/2016  . 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    Past Surgical History:  Procedure Laterality Date  . IR ANGIO INTRA EXTRACRAN SEL COM CAROTID INNOMINATE UNI L MOD SED  03/10/2019  . IR ANGIO VERTEBRAL SEL SUBCLAVIAN INNOMINATE BILAT MOD SED  03/10/2019  . IR CT HEAD LTD  03/10/2019  . IR PERCUTANEOUS ART THROMBECTOMY/INFUSION INTRACRANIAL INC DIAG ANGIO  03/10/2019  . RADIOLOGY WITH ANESTHESIA N/A 03/10/2019   Procedure: IR WITH ANESTHESIA/CODE STROKE;  Surgeon: Radiologist, Medication, MD;  Location: [facility];  Service: Radiology;  Laterality: N/A;       Family History  Problem Relation Age of [place]  . Diabetes Other   . Hypertension Other   . Healthy Mother   . Healthy Father     Social History   Tobacco Use  . Smoking status: Never Smoker  . Smokeless tobacco: Never Used  Vaping Use  . Vaping Use: Never used  Substance Use Topics  . Alcohol use: No  . Drug use: No    Home Medications Prior to Admission medications   Medication Sig Start Date End Date Taking? Authorizing Provider  acetaminophen (TYLENOL) 325 MG tablet Take 2 tablets (650 mg total) by mouth every 4 (four) hours as needed for mild pain (or temp > 37.5 C (99.5 F)). 06/19/19   Angiulli, Lavon Paganini, PA-C  amLODipine (NORVASC) 5 MG tablet Take 1 tablet (5 mg total) by mouth daily. To lower blood pressure 01/14/20   Fulp, Cammie, MD  aspirin EC 81  MG tablet Take 1 tablet (81 mg total) by mouth daily. 09/08/13   Reyne Dumas, MD  atorvastatin (LIPITOR) 40 MG tablet One daily in the evening to lower cholesterol 01/14/20   Fulp, Cammie, MD  cefdinir (OMNICEF) 300 MG capsule Take 1 capsule (300 mg total) by mouth 2 (two) times daily for 7 days. 06/07/20 06/14/20  Mayte Diers C, PA-C  donepezil (ARICEPT) 10 MG tablet TAKE 1 TABLET BY MOUTH AT BEDTIME 04/03/20   Fulp, Cammie, MD  furosemide (LASIX) 20 MG tablet Take 1 tablet (20 mg total) by mouth daily. 03/22/20   Raylene Everts, MD  guaiFENesin-codeine [facility]) 100-10 MG/5ML syrup Take 5 mLs by mouth 3 (three) times daily as needed for cough. 06/05/20   Faustino Congress, NP  metFORMIN (GLUCOPHAGE) 500 MG tablet Take 0.5 tablets (250 mg total) by mouth daily with breakfast. 05/25/20   Ladell Pier, MD  ondansetron (ZOFRAN-ODT) 4 MG disintegrating tablet Take 1 tablet (4 mg total) by mouth every 8 (eight) hours as needed for nausea or vomiting. 06/04/20   Vanessa Kick, MD  QUEtiapine (SEROQUEL) 25 MG tablet Take 2 tablets (50 mg total) by mouth at bedtime. Patient taking differently: Take 50 mg by mouth at bedtime. Wife reports that pt is taking 1 pill, 2x/day 09/03/19   Elsie Stain, MD    Allergies    Patient has no known allergies.  Review of Systems   Review of Systems  Constitutional: Positive for fever.  Respiratory: Positive for cough and shortness of breath.   Cardiovascular: Negative for chest pain.  Gastrointestinal: Positive for vomiting (post-tussive). Negative for abdominal pain, blood in stool, diarrhea and nausea.  Genitourinary: Negative for dysuria and hematuria.  Musculoskeletal: Negative for back pain and neck pain.  Neurological: Negative for dizziness, weakness and numbness.  All other systems reviewed and are negative.   Physical Exam Updated Vital Signs BP (!) 165/93 (BP Location: Left Arm)   Pulse 84   Temp 98.6 F (37 C) (Oral)   Resp 16   SpO2 96%   Physical Exam Vitals and nursing note reviewed.  Constitutional:      General: He is not in acute distress.    Appearance: He is well-developed. He is not diaphoretic.  HENT:     Head: Normocephalic and atraumatic.     Mouth/Throat:     Mouth: Mucous membranes are moist.     Pharynx: Oropharynx is clear.     Comments: No noted difficulty handling oral secretions. Eyes:     Conjunctiva/sclera: Conjunctivae normal.  Cardiovascular:     Rate and Rhythm: Normal rate and regular rhythm.     Pulses: Normal pulses.          Radial pulses are 2+ on  the right side and 2+ on the left side.       Posterior tibial pulses are 2+ on the right side and 2+ on the left side.     Heart sounds: Normal heart sounds.     Comments: Tactile temperature in the extremities appropriate and equal bilaterally. Pulmonary:     Effort: Pulmonary effort is normal. No respiratory distress.     Breath sounds: Normal breath sounds.     Comments: No increased work of breathing.  Speaks full sentences without noted difficulty. Abdominal:     Palpations: Abdomen is soft.     Tenderness: There is no abdominal tenderness. There is no guarding.  Genitourinary:    Comments: Genital Exam: Penis, scrotum,  and testicles without swelling, lesions, or tenderness.  No inguinal hernia noted. No inguinal lymphadenopathy.  Overall normal male genitalia.  Musculoskeletal:     Cervical back: Neck supple.     Right lower leg: No edema.     Left lower leg: No edema.  Lymphadenopathy:     Cervical: No cervical adenopathy.  Skin:    General: Skin is warm and dry.  Neurological:     Mental Status: He is alert.  Psychiatric:        Mood and Affect: Mood and affect normal.        Speech: Speech normal.        Behavior: Behavior normal.     ED Results / Procedures / Treatments   Labs (all labs ordered are listed, but only abnormal results are displayed) Labs Reviewed  CBC WITH DIFFERENTIAL/PLATELET - Abnormal; Notable for the following components:      Result Value   RBC 4.00 (*)    Hemoglobin 12.2 (*)    HCT 37.6 (*)    Monocytes Absolute 1.2 (*)    All other components within normal limits  BASIC METABOLIC PANEL - Abnormal; Notable for the following components:   Glucose, Bld 201 (*)    All other components within normal limits  URINALYSIS, ROUTINE W REFLEX MICROSCOPIC - Abnormal; Notable for the following components:   APPearance TURBID (*)    Hgb urine dipstick MODERATE (*)    Protein, ur 100 (*)    Leukocytes,Ua LARGE (*)    WBC, UA >50 (*)    Bacteria,  UA MANY (*)    All other components within normal limits  URINE CULTURE    Hemoglobin  Date Value Ref Range Status  06/07/2020 12.2 (L) 13.0 - 17.0 g/dL Final  03/22/2020 12.9 (L) 13.0 - 17.0 g/dL Final  09/03/2019 13.5 13.0 - 17.7 g/dL Final  06/19/2019 12.1 (L) 13.0 - 17.0 g/dL Final  06/04/2019 11.6 (L) 13.0 - 17.0 g/dL Final    EKG None  Radiology DG Chest 2 View  Result Date: 06/07/2020 CLINICAL DATA:  Fever EXAM: CHEST - 2 VIEW COMPARISON:  March 22, 2020 FINDINGS: Again noted is mild cardiomegaly. There are few Ker[place] B lines bilaterally. Aortic calcifications are noted. There is no pneumothorax. No pleural effusion. No definite focal infiltrate. IMPRESSION: Cardiomegaly with mild interstitial edema. Electronically Signed   By: Constance Holster M.D.   On: 06/07/2020 02:39    Procedures Procedures (including critical care time)  Medications Ordered in ED Medications - No data to display  ED Course  I have reviewed the triage vital signs and the nursing notes.  Pertinent labs & imaging results that were available during my care of the patient were reviewed by me and considered in my medical decision making (see chart for details).  Clinical Course as of Jun 07 1436  Mon Jun 07, 2020  1428 Spoke with Apolonio Schneiders, ED pharmacist, for advice on antibiotic choice. We discussed that we would want an antibiotic that would cover respiratory infection as well as UTI.  We reviewed the past couple urinary cultures. I thought was that we would lean towards covering the bacteria that was grown out on the last culture since patient was wearing a brief at that time and the modality of infection should be similar. She recommended cefdinir 300 mg twice daily for 7 days.   [SJ]    Clinical Course User Index [SJ] Margurite Duffy, Helane Gunther, PA-C   MDM Rules/Calculators/A&P  Patient presents complaining of reported cough and fever, as well as intermittent shortness of breath,  for the last week. Patient is nontoxic appearing, afebrile, not tachycardic, not tachypneic, not hypotensive, maintains excellent SPO2 on room air, and is in no apparent distress.   I have reviewed the patient's chart to obtain more information.   I reviewed and interpreted the patient's labs and radiological studies. Radiologist read patient's x-ray as possible interstitial edema.  No orthopnea, rales, or lower extremity edema.  No evidence of respiratory distress on exam. With the patient's recent history of cough and fever at home as well as having been seen multiple times over the last few days, I think it prudent at this point to initiate antibiotic therapy for the patient.  Especially since he had a negative Covid test October 1.  Patient also has evidence of possible UTI. Our goal was to treat both respiratory illness and UTI with one antibiotic (see ED course above).  Patient and his wife were given instructions for home care as well as return precautions.  Both parties voice understanding of these instructions, accept the plan, and are comfortable with discharge.  Findings and plan of care discussed with Joe Boss, MD. Dr. Jeanell Perry personally evaluated and examined this patient.  Vitals:   06/07/20 1200 06/07/20 1215 06/07/20 1445 06/07/20 1446  BP: (!) 160/87 (!) 160/85 (!) 170/95 (!) 170/95  Pulse: 75 68 95 96  Resp:    15  Temp:      TempSrc:      SpO2: 94% 93% 95% 94%     Final Clinical Impression(s) / ED Diagnoses Final diagnoses:  Cough  Fever, unspecified fever cause  Acute cystitis with hematuria    Rx / DC Orders ED Discharge Orders         Ordered    cefdinir (OMNICEF) 300 MG capsule  2 times daily        06/07/20 [address] PA-C 06/07/20 1617    Joe Boss, MD 06/09/20 1123

## 2020-06-07 NOTE — ED Triage Notes (Signed)
Pt was here on 06/04/20 and was sent home with dx of upper respiratory infection. Pt iS at baseline hx of CVA. EMS said wife was concerned bc pt was holding belly and running low grade temp. Pt sounds very junky with his cough.

## 2020-06-08 NOTE — ED Provider Notes (Signed)
Joe Perry   [id number] 06/04/20 Arrival Time: 1853  ASSESSMENT & PLAN:  1. Cough   2. Viral upper respiratory tract infection   3. Post-tussive emesis     Lung exam normal. Reassured.   COVID-19 testing sent.   Meds ordered this encounter  Medications  . HYDROcodone-homatropine (HYCODAN) 5-1.5 MG/5ML syrup    Sig: Take 5 mLs by mouth every 6 (six) hours as needed for cough.    Dispense:  90 mL    Refill:  0  . ondansetron (ZOFRAN-ODT) 4 MG disintegrating tablet    Sig: Take 1 tablet (4 mg total) by mouth every 8 (eight) hours as needed for nausea or vomiting.    Dispense:  15 tablet    Refill:  0     Follow-up Information    Antony Blackbird, MD.   Specialty: Family Medicine Why: As needed. Contact information: [place] [address] [phone]               Reviewed expectations re: course of current medical issues. Questions answered. Outlined signs and symptoms indicating need for more acute intervention. Understanding verbalized. After Visit Summary given.   SUBJECTIVE: History from: patient and wife. Joe Eddy Sr. is a 69 y.o. male who reports coughing with post-tussive emesis. Several days. Afebrile. Tolerating PO intake but decreased appetite. No SOB reported. Coughing affecting sleep. No back, chest, or abd pain. Feels fatigued. No home tx reported.    OBJECTIVE:  Vitals:   06/04/20 1947  BP: 134/87  Pulse: 83  Resp: 20  Temp: 97.8 F (36.6 C)  TempSrc: Oral  SpO2: 99%    General appearance: alert; no distress Eyes: PERRLA; EOMI; conjunctiva normal HENT: [place]; AT; with mild nasal congestion Neck: supple  Lungs: speaks full sentences without difficulty; unlabored; slight dry cough; no wheezing; CTAB Extremities: no edema Skin: warm and dry Neurologic: normal gait Psychological: alert and cooperative; normal mood and affect  Labs:  Labs Reviewed  SARS CORONAVIRUS 2 (TAT 6-24 HRS)     No  Known Allergies  Past Medical History:  Diagnosis Date  . Diabetes mellitus without complication ([place])   . Dysphagia    post stroke  . Hypertension   . Stroke [facility])    Social History   Socioeconomic History  . Marital status: Married    Spouse name: [name]  . Number of children: Not on file  . Years of education: Not on file  . Highest education level: Not on file  Occupational History  . Not on file  Tobacco Use  . Smoking status: Never Smoker  . Smokeless tobacco: Never Used  Vaping Use  . Vaping Use: Never used  Substance and Sexual Activity  . Alcohol use: No  . Drug use: No  . Sexual activity: Not Currently  Other Topics Concern  . Not on file  Social History Narrative   12/16/19 Lives with wife   Social Determinants of Health   Financial Resource Strain:   . Difficulty of Paying Living Expenses: Not on file  Food Insecurity:   . Worried About Charity fundraiser in the Last Year: Not on file  . Ran Out of Food in the Last Year: Not on file  Transportation Needs:   . Lack of Transportation (Medical): Not on file  . Lack of Transportation (Non-Medical): Not on file  Physical Activity:   . Days of Exercise per Week: Not on file  . Minutes of Exercise per Session:  Not on file  Stress:   . Feeling of Stress : Not on file  Social Connections:   . Frequency of Communication with Friends and Family: Not on file  . Frequency of Social Gatherings with Friends and Family: Not on file  . Attends Religious Services: Not on file  . Active Member of Clubs or Organizations: Not on file  . Attends Archivist Meetings: Not on file  . Marital Status: Not on file  Intimate Partner Violence:   . Fear of Current or Ex-Partner: Not on file  . Emotionally Abused: Not on file  . Physically Abused: Not on file  . Sexually Abused: Not on file   Family History  Problem Relation Age of Onset  . Diabetes Other   . Hypertension Other   . Healthy Mother   . Healthy  Father    Past Surgical History:  Procedure Laterality Date  . IR ANGIO INTRA EXTRACRAN SEL COM CAROTID INNOMINATE UNI L MOD SED  03/10/2019  . IR ANGIO VERTEBRAL SEL SUBCLAVIAN INNOMINATE BILAT MOD SED  03/10/2019  . IR CT HEAD LTD  03/10/2019  . IR PERCUTANEOUS ART THROMBECTOMY/INFUSION INTRACRANIAL INC DIAG ANGIO  03/10/2019  . RADIOLOGY WITH ANESTHESIA N/A 03/10/2019   Procedure: IR WITH ANESTHESIA/CODE STROKE;  Surgeon: Radiologist, Medication, MD;  Location: [facility];  Service: Radiology;  Laterality: N/AVanessa Kick, MD 06/08/20 1018

## 2020-06-09 LAB — URINE CULTURE: Culture: >=100000 — AB

## 2020-06-10 ENCOUNTER — Telehealth: Payer: Self-pay | Admitting: *Deleted

## 2020-06-10 NOTE — Telephone Encounter (Signed)
Post ED Visit - Positive Culture Follow-up  Culture report reviewed by antimicrobial stewardship pharmacist: [place] Team []  Elenor Quinones, Pharm.D. []  Heide Guile, Pharm.D., BCPS AQ-ID []  Parks Neptune, Pharm.D., BCPS []  Alycia Rossetti, Pharm.D., BCPS []  [place], Pharm.D., BCPS, AAHIVP []  Legrand Como, Pharm.D., BCPS, AAHIVP []  Salome Arnt, PharmD, BCPS []  Johnnette Gourd, PharmD, BCPS []  Hughes Better, PharmD, BCPS []  Leeroy Cha, PharmD []  Laqueta Linden, PharmD, BCPS []  Albertina Parr, PharmD  [place] Team []  Leodis Sias, PharmD []  Lindell Spar, PharmD []  Royetta Asal, PharmD []  Graylin Shiver, Rph []  Rema Fendt) Glennon Mac, PharmD []  Arlyn Dunning, PharmD []  Netta Cedars, PharmD []  Dia Sitter, PharmD []  Leone Haven, PharmD []  Gretta Arab, PharmD []  Theodis Shove, PharmD []  Peggyann Juba, PharmD []  Reuel Boom, PharmD   Positive urine culture Treated with Cefdinir, organism sensitive to the same and no further patient follow-up is required at this time.  Dimple Nanas, PharmD  Harlon Flor Talley 06/10/2020, 9:54 AM

## 2020-07-26 ENCOUNTER — Telehealth: Payer: Self-pay | Admitting: Critical Care Medicine

## 2020-07-26 DIAGNOSIS — I69319 Unspecified symptoms and signs involving cognitive functions following cerebral infarction: Secondary | ICD-10-CM

## 2020-07-26 DIAGNOSIS — I10 Essential (primary) hypertension: Secondary | ICD-10-CM

## 2020-07-26 DIAGNOSIS — E1159 Type 2 diabetes mellitus with other circulatory complications: Secondary | ICD-10-CM

## 2020-07-26 DIAGNOSIS — I6930 Unspecified sequelae of cerebral infarction: Secondary | ICD-10-CM

## 2020-07-26 DIAGNOSIS — E785 Hyperlipidemia, unspecified: Secondary | ICD-10-CM

## 2020-07-26 MED ORDER — QUETIAPINE FUMARATE 25 MG PO TABS: 50.0000 mg | ORAL_TABLET | Freq: Every day | ORAL | 3 refills | Status: DC

## 2020-07-26 MED ORDER — ASPIRIN EC 81 MG PO TBEC: 81.0000 mg | DELAYED_RELEASE_TABLET | Freq: Every day | ORAL | 3 refills | Status: AC

## 2020-07-26 MED ORDER — METFORMIN HCL 500 MG PO TABS: 250.0000 mg | ORAL_TABLET | Freq: Every day | ORAL | 3 refills | Status: DC

## 2020-07-26 MED ORDER — DONEPEZIL HCL 10 MG PO TABS: 10.0000 mg | ORAL_TABLET | Freq: Every day | ORAL | 0 refills | Status: DC

## 2020-07-26 MED ORDER — ATORVASTATIN CALCIUM 40 MG PO TABS: ORAL_TABLET | ORAL | 2 refills | Status: DC

## 2020-07-26 MED ORDER — AMLODIPINE BESYLATE 5 MG PO TABS: 5.0000 mg | ORAL_TABLET | Freq: Every day | ORAL | 2 refills | Status: DC

## 2020-07-26 NOTE — Telephone Encounter (Signed)
Patient's wife came into the clinic requesting re-fills on donepezil and quetiapine. Patient's PCP was Fulp and requested to change PCP to [facility]. Patient is completely out of medications and wondered if it could be refilled until his appt 11/30. Please follow up.

## 2020-07-26 NOTE — Telephone Encounter (Signed)
Will refill medicaitons

## 2020-08-03 ENCOUNTER — Encounter: Payer: Self-pay | Admitting: Critical Care Medicine

## 2020-08-03 ENCOUNTER — Other Ambulatory Visit: Payer: Self-pay

## 2020-08-03 ENCOUNTER — Ambulatory Visit: Payer: Medicare Other | Attending: Critical Care Medicine | Admitting: Critical Care Medicine

## 2020-08-03 VITALS — BP 120/79 | HR 79 | Temp 98.1°F | Wt 176.2 lb

## 2020-08-03 DIAGNOSIS — E1159 Type 2 diabetes mellitus with other circulatory complications: Secondary | ICD-10-CM

## 2020-08-03 DIAGNOSIS — I10 Essential (primary) hypertension: Secondary | ICD-10-CM

## 2020-08-03 DIAGNOSIS — E785 Hyperlipidemia, unspecified: Secondary | ICD-10-CM

## 2020-08-03 DIAGNOSIS — I69391 Dysphagia following cerebral infarction: Secondary | ICD-10-CM

## 2020-08-03 DIAGNOSIS — E119 Type 2 diabetes mellitus without complications: Secondary | ICD-10-CM | POA: Diagnosis not present

## 2020-08-03 DIAGNOSIS — E1165 Type 2 diabetes mellitus with hyperglycemia: Secondary | ICD-10-CM

## 2020-08-03 DIAGNOSIS — I6300 Cerebral infarction due to thrombosis of unspecified precerebral artery: Secondary | ICD-10-CM | POA: Diagnosis not present

## 2020-08-03 LAB — POCT GLYCOSYLATED HEMOGLOBIN (HGB A1C): Hemoglobin A1C: 6.7 % — AB (ref 4.0–5.6)

## 2020-08-03 LAB — GLUCOSE, POCT (MANUAL RESULT ENTRY): POC Glucose: 223 mg/dl — AB (ref 70–99)

## 2020-08-03 MED ORDER — METFORMIN HCL 500 MG PO TABS: 1000.0000 mg | ORAL_TABLET | Freq: Two times a day (BID) | ORAL | 1 refills | Status: DC

## 2020-08-03 NOTE — Assessment & Plan Note (Signed)
Cough is likely related to dysphagia we will repeat modified barium swallow with speech pathology input

## 2020-08-03 NOTE — Assessment & Plan Note (Signed)
Residual defects of dysphagia Continue aspirin and atorvastatin

## 2020-08-03 NOTE — Assessment & Plan Note (Signed)
Hypertension well-controlled no change in medications 

## 2020-08-03 NOTE — Patient Instructions (Signed)
Increase Metformin to 2   500 mg tablets twice daily  No other medication changes  A swallow evaluation will be made with speech pathology  A urine sample was obtained to look for protein in the urine  Return to see Dr. Joya Gaskins in 2 months

## 2020-08-03 NOTE — Progress Notes (Signed)
Subjective:    Patient ID: Joe Eddy Sr., male    DOB: 06-May-1951, 69 y.o.   MRN: [id number]  09/03/19 This is a 69 year old male here for post hospital follow-up and a face-to-face exam.  His last visit was a telephone visit in November with one of our APP's.  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  Patient also has type 2 diabetes on Metformin, dyslipidemia on atorvastatin with a goal of LDL less than 70, hypertension on amlodipine 5 mg daily, and urinary incontinence now using diapering supplies.  Note the patient has been seen by neurology and physical medicine and rehab however neither service had yet ordered any outpatient speech physical or occupational therapy.  He was seen briefly by home-based therapy with [facility].  He is here to establish further for primary care so that we can follow through on these needs.  Also the patient spouse complains that he sleeps a lot during the daytime but upon further review he is taking his Seroquel multiple times during the day instead of at bedtime therefore he is not sleeping at night and is sleeping during the day.  Note the patient is on Metformin and his blood glucoses have been well controlled  Blood pressures also been well controlled and today in the office blood pressure is 125/77  08/03/2020 Chronic cough 4 weeks This patient comes in today to establish for primary care as his other primary care physician has left the practice.  I did seen this patient previously earlier in the year.  History of hypertension which has been well controlled and on arrival he is 120/79.  Previous stroke involving right posterior communicating artery with thrombectomy removal of large clot and now with residual deficits of hemiparesis on the right upper extremity and significant dysphagia and trouble swallowing.  He had been seeing  speech pathology and they are requiring another modified barium swallow for this patient.  He is following with a nectar thick liquids and having his spouse watch him eat however she states he still coughs quite a bit when eating and has been to the emergency room several times for this.  Also the emergency room recently for bladder irritation found to have a E. coli UTI and was given a course of antibiotics.  Patient maintains his aspirin atorvastatin and Aricept but is now off Brilinta.  Patient is taking the Seroquel at bedtime this is cause less drowsiness during the daytime Patient also has diabetes is only taking 250 mg of Metformin daily on arrival blood sugar is 223 and hemoglobin A1c has increased to 6.7 on this visit  Past Medical History:  Diagnosis Date  . 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 ([place]) 06/03/2019  . Cerebrovascular accident (CVA) due to occlusion of vertebral artery ([place])   . Diabetes mellitus without complication ([place])   . Dysphagia    post stroke  . Hypertension   . Stroke [facility]      Family History  Problem Relation Age of Onset  . Diabetes Other   . Hypertension Other   . Healthy Mother   . Healthy Father      Social History   Socioeconomic History  . Marital status: Married    Spouse name: [name]  . Number of children: Not on file  . Years of education: Not on file  . Highest education level: Not on  file  Occupational History  . Not on file  Tobacco Use  . Smoking status: Never Smoker  . Smokeless tobacco: Never Used  Vaping Use  . Vaping Use: Never used  Substance and Sexual Activity  . Alcohol use: No  . Drug use: No  . Sexual activity: Not Currently  Other Topics Concern  . Not on file  Social History Narrative   12/16/19 Lives with wife   Social Determinants of Health   Financial Resource Strain:   . Difficulty of Paying Living Expenses: Not on file  Food Insecurity:   . Worried About  Charity fundraiser in the Last Year: Not on file  . Ran Out of Food in the Last Year: Not on file  Transportation Needs:   . Lack of Transportation (Medical): Not on file  . Lack of Transportation (Non-Medical): Not on file  Physical Activity:   . Days of Exercise per Week: Not on file  . Minutes of Exercise per Session: Not on file  Stress:   . Feeling of Stress : Not on file  Social Connections:   . Frequency of Communication with Friends and Family: Not on file  . Frequency of Social Gatherings with Friends and Family: Not on file  . Attends Religious Services: Not on file  . Active Member of Clubs or Organizations: Not on file  . Attends Archivist Meetings: Not on file  . Marital Status: Not on file  Intimate Partner Violence:   . Fear of Current or Ex-Partner: Not on file  . Emotionally Abused: Not on file  . Physically Abused: Not on file  . Sexually Abused: Not on file     No Known Allergies   Outpatient Medications Prior to Visit  Medication Sig Dispense Refill  . acetaminophen (TYLENOL) 325 MG tablet Take 2 tablets (650 mg total) by mouth every 4 (four) hours as needed for mild pain (or temp > 37.5 C (99.5 F)).    Marland Kitchen amLODipine (NORVASC) 5 MG tablet Take 1 tablet (5 mg total) by mouth daily. To lower blood pressure 90 tablet 2  . aspirin EC 81 MG tablet Take 1 tablet (81 mg total) by mouth daily. 100 tablet 3  . atorvastatin (LIPITOR) 40 MG tablet One daily in the evening to lower cholesterol 90 tablet 2  . donepezil (ARICEPT) 10 MG tablet Take 1 tablet (10 mg total) by mouth at bedtime. 90 tablet 0  . QUEtiapine (SEROQUEL) 25 MG tablet Take 2 tablets (50 mg total) by mouth at bedtime. 60 tablet 3  . furosemide (LASIX) 20 MG tablet Take 1 tablet (20 mg total) by mouth daily. 10 tablet 0  . metFORMIN (GLUCOPHAGE) 500 MG tablet Take 0.5 tablets (250 mg total) by mouth daily with breakfast. 30 tablet 3  . guaiFENesin-codeine (ROBITUSSIN AC) 100-10 MG/5ML syrup  Take 5 mLs by mouth 3 (three) times daily as needed for cough. (Patient not taking: Reported on 08/03/2020) 120 mL 0  . ondansetron (ZOFRAN-ODT) 4 MG disintegrating tablet Take 1 tablet (4 mg total) by mouth every 8 (eight) hours as needed for nausea or vomiting. (Patient not taking: Reported on 08/03/2020) 15 tablet 0   No facility-administered medications prior to visit.      Review of Systems Constitutional:   No  weight loss, night sweats,  Fevers, chills, fatigue, lassitude. HEENT:    headaches,  Difficulty swallowing,  Tooth/dental problems,  Sore throat,  No sneezing, itching, ear ache, nasal congestion, post nasal drip,   CV:  No chest pain,  Orthopnea, PND, swelling in lower extremities, anasarca, dizziness, palpitations  GI  No heartburn, indigestion, abdominal pain, nausea, vomiting, diarrhea, change in bowel habits, loss of appetite  Resp: No shortness of breath with exertion or at rest.  No excess mucus, no productive cough,  No non-productive cough,  No coughing up of blood.  No change in color of mucus.  No wheezing.  No chest wall deformity  Skin: no rash or lesions.  GU: no dysuria, change in color of urine, no urgency or frequency.  No flank pain.  MS:  No joint pain or swelling.  No decreased range of motion.  No back pain.  Psych:   change in mood or affect.  depression or anxiety.   memory loss.     Objective:   Physical Exam Vitals:   08/03/20 1635  BP: 120/79  Pulse: 79  Temp: 98.1 F (36.7 C)  SpO2: 97%  Weight: 176 lb 3.2 oz (79.9 kg)    Gen: somnolent but arousable  ENT: No lesions,  mouth clear,  oropharynx clear, no postnasal drip  Neck: No JVD, no TMG, no carotid bruits  Lungs: No use of accessory muscles, no dullness to percussion, scattered rhonchi Cardiovascular: RRR, heart sounds normal, no murmur or gallops, no peripheral edema  Abdomen: soft and NT, no HSM,  BS normal  Musculoskeletal: No deformities, no cyanosis or  clubbing  Neuro:somnolent through exam.   Some drooling, difficult protruding tongue still persists  Skin: Warm, no lesions or rashes Foot exam was normal No results found.   Ct Head Code Stroke Wo Contrast 03/10/2019 1454 1. No acute finding by CT. Extensive chronic small-vessel ischemic changes throughout the brain as outlined above. Old right frontal infarction. 2. ASPECTS is 10.   Ct Angio Head W Or Wo Contrast Ct Angio Neck W Or Wo Contrast Ct Cerebral Perfusion W Contrast 03/10/2019 1517 1. Fetal type right PCA with proximal occlusion leading to 19 cc of penumbra and no infarct by CT perfusion. There may be a right P1 segment that is also occluded. 2. 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 3. Subjectively advanced left cavernous ICA stenosis. 4. 30-40% atheromatous narrowing at the right ICA bulb.   CT angio/ IR Percutaneous Art Thrombectomy 03/10/2019 S/P 4 vessel cerebral arteriogram followed by completev Revascularization Of RT PCOM and RT PCA with x 1 pass with 58mm x 65mm embotrap retriver device achieving a TICI 2b revascularization. 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 Motion degraded study. 2. Ischemic infarct of the right posterior cerebral artery territory, roughly corresponding to the ischemic volume identified on the earlier CT perfusion scan. 3. No hemorrhage or mass effect. 4. Chronic ischemic microangiopathy and age advanced atrophy.  CT Head WO Contrast 8/72020 Evolving infarct in the right PCA territory. No progression since prior studies. No acute hemorrhage.Atrophy and extensive chronic vessel ischemia.  MRI 05/02/2019 1. Resolving diffusion restriction in the right PCA territory, with patchy residual in the right splenium. Petechial hemorrhage and post ischemic  enhancement in the right thalamus and right occipital lobe. No mass effect.  2. Underlying severe chronic ischemic disease with superimposed new small acute to subacute infarcts in the:  - left basal ganglia. - right cerebellar  peduncle. 3. 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  2D Echocardiogram 1. The left ventricle has normal systolic function with an ejection fraction of 60-65%. The cavity size was normal. Left ventricular diastolic Doppler parameters are consistent with impaired relaxation. 2. The right ventricle has normal systolic function. The cavity was normal. There is no increase in right ventricular wall thickness. 3. Left atrial size was mildly dilated. 4. Right atrial size was mildly dilated. 5. The aortic root and ascending aorta are normal in size and structure. 6. The interatrial septum was not assessed.  CXR  05/28/2019 1. No radiographic evidence of acute cardiopulmonary disease. 2. Aortic atherosclerosis.        Assessment & Plan:  I personally reviewed all images and lab data in the [facility] system as well as any outside material available during this office visit and agree with the  radiology impressions.   Benign essential HTN Hypertension well controlled no change in medications  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, d/t large vessel dz Residual defects of dysphagia Continue aspirin and atorvastatin   Dysphagia due to recent stroke Cough is likely related to dysphagia we will repeat modified barium swallow with speech pathology input  Controlled type 2 diabetes mellitus with hyperglycemia, without long-term current use of insulin ([place]) Diabetes not as well-controlled this visit he is underdosing his Metformin  We will increase Metformin to 500 mg twice daily  Dyslipidemia, goal LDL below 70 Continue atorvastatin   [place] was seen today for follow-up.  Diagnoses and all orders for this  visit:  Type 2 diabetes mellitus with other circulatory complication, without long-term current use of insulin ([name]) -     metFORMIN (GLUCOPHAGE) 500 MG tablet; Take 2 tablets (1,000 mg total) by mouth 2 (two) times daily with a meal. -     Microalbumin / creatinine urine ratio  Type 2 diabetes mellitus without complication, without long-term current use of insulin ([name]) -     POCT glucose (manual entry) -     POCT glycosylated hemoglobin (Hb A1C)  Cerebrovascular accident (CVA) due to thrombosis of precerebral artery ([place]) -     SLP modified barium swallow; Future  Dysphagia, post-stroke -     SLP modified barium swallow; Future  Benign essential HTN  Dysphagia due to recent stroke  Controlled type 2 diabetes mellitus with hyperglycemia, without long-term current use of insulin ([name])  Dyslipidemia, goal LDL below 70   Hep screen obtained

## 2020-08-03 NOTE — Progress Notes (Signed)
COUGH FOR 4 WEEKS  DRY COUGH

## 2020-08-03 NOTE — Assessment & Plan Note (Signed)
Diabetes not as well-controlled this visit he is underdosing his Metformin  We will increase Metformin to 500 mg twice daily

## 2020-08-03 NOTE — Assessment & Plan Note (Signed)
Continue atorvastatin

## 2020-08-04 DIAGNOSIS — E1159 Type 2 diabetes mellitus with other circulatory complications: Secondary | ICD-10-CM | POA: Diagnosis not present

## 2020-08-05 LAB — MICROALBUMIN / CREATININE URINE RATIO
Creatinine, Urine: 38 mg/dL
Microalb/Creat Ratio: 44 mg/g creat — ABNORMAL HIGH (ref 0–29)
Microalbumin, Urine: 16.9 ug/mL

## 2020-08-25 ENCOUNTER — Ambulatory Visit: Payer: Medicare Other | Admitting: Family Medicine

## 2020-08-30 ENCOUNTER — Ambulatory Visit: Payer: Medicare Other | Admitting: Critical Care Medicine

## 2020-10-03 NOTE — Progress Notes (Signed)
Subjective:    Patient ID: Joe Eddy Sr., male    DOB: [birth year], 70 y.o.   MRN: [id number]  09/03/19 This is a 70 year old male here for post hospital follow-up and a face-to-face exam.  His last visit was a telephone visit in November with one of our APP's.  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  Patient also has type 2 diabetes on Metformin, dyslipidemia on atorvastatin with a goal of LDL less than 70, hypertension on amlodipine 5 mg daily, and urinary incontinence now using diapering supplies.  Note the patient has been seen by neurology and physical medicine and rehab however neither service had yet ordered any outpatient speech physical or occupational therapy.  He was seen briefly by home-based therapy with [facility].  He is here to establish further for primary care so that we can follow through on these needs.  Also the patient spouse complains that he sleeps a lot during the daytime but upon further review he is taking his Seroquel multiple times during the day instead of at bedtime therefore he is not sleeping at night and is sleeping during the day.  Note the patient is on Metformin and his blood glucoses have been well controlled  Blood pressures also been well controlled and today in the office blood pressure is 125/77  08/03/2020 Chronic cough 4 weeks This patient comes in today to establish for primary care as his other primary care physician has left the practice.  I did seen this patient previously earlier in the year.  History of hypertension which has been well controlled and on arrival he is 120/79.  Previous stroke involving right posterior communicating artery with thrombectomy removal of large clot and now with residual deficits of hemiparesis on the right upper extremity and significant dysphagia and trouble swallowing.  He had been seeing  speech pathology and they are requiring another modified barium swallow for this patient.  He is following with a nectar thick liquids and having his spouse watch him eat however she states he still coughs quite a bit when eating and has been to the emergency room several times for this.  Also the emergency room recently for bladder irritation found to have a E. coli UTI and was given a course of antibiotics.  Patient maintains his aspirin atorvastatin and Aricept but is now off Brilinta.  Patient is taking the Seroquel at bedtime this is cause less drowsiness during the daytime Patient also has diabetes is only taking 250 mg of Metformin daily on arrival blood sugar is 223 and hemoglobin A1c has increased to 6.7 on this visit  10/04/20 Patient is seen in return follow-up accompanied by his spouse who is his caregiver.  Spouse expresses frustration and she is having increased difficulty caring for this patient.  He does have a chronic cough and drooling from his previous stroke he cannot swallow he cannot talk at this time.  The patient does not had no falls at this time.  He has been compliant with all his medications.  On arrival blood pressure is 145/84 oxygen level is adequate blood glucose is 153  Patient has no other specific complaints other than the chronic cough at this time.  He was to be referred to speech pathology however the consult has not yet occurred Note due to his medical status he is not a candidate for colonoscopy and Pneumovax is not indicated  at this time  The patient's spouse is indicating a desire to look into long-term care options   Past Medical History:  Diagnosis Date  . 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 ([place]) 06/03/2019  . Cerebrovascular accident (CVA) due to occlusion of vertebral artery ([place])   . Diabetes mellitus without complication ([place])   . Dysphagia    post stroke  . Hypertension   . Stroke [facility])       Family History  Problem Relation Age of Onset  . Diabetes Other   . Hypertension Other   . Healthy Mother   . Healthy Father      Social History   Socioeconomic History  . Marital status: Married    Spouse name: Joe Perry  . Number of children: Not on file  . Years of education: Not on file  . Highest education level: Not on file  Occupational History  . Not on file  Tobacco Use  . Smoking status: Never Smoker  . Smokeless tobacco: Never Used  Vaping Use  . Vaping Use: Never used  Substance and Sexual Activity  . Alcohol use: No  . Drug use: No  . Sexual activity: Not Currently  Other Topics Concern  . Not on file  Social History Narrative   12/16/19 Lives with wife   Social Determinants of Health   Financial Resource Strain: Not on file  Food Insecurity: Not on file  Transportation Needs: Not on file  Physical Activity: Not on file  Stress: Not on file  Social Connections: Not on file  Intimate Partner Violence: Not on file     No Known Allergies   Outpatient Medications Prior to Visit  Medication Sig Dispense Refill  . aspirin EC 81 MG tablet Take 1 tablet (81 mg total) by mouth daily. 100 tablet 3  . amLODipine (NORVASC) 5 MG tablet Take 1 tablet (5 mg total) by mouth daily. To lower blood pressure 90 tablet 2  . atorvastatin (LIPITOR) 40 MG tablet One daily in the evening to lower cholesterol 90 tablet 2  . donepezil (ARICEPT) 10 MG tablet Take 1 tablet (10 mg total) by mouth at bedtime. 90 tablet 0  . metFORMIN (GLUCOPHAGE) 500 MG tablet Take 2 tablets (1,000 mg total) by mouth 2 (two) times daily with a meal. 120 tablet 1  . QUEtiapine (SEROQUEL) 25 MG tablet Take 2 tablets (50 mg total) by mouth at bedtime. 60 tablet 3  . acetaminophen (TYLENOL) 325 MG tablet Take 2 tablets (650 mg total) by mouth every 4 (four) hours as needed for mild pain (or temp > 37.5 C (99.5 F)).    Marland Kitchen guaiFENesin-codeine (ROBITUSSIN AC) 100-10 MG/5ML syrup Take 5 mLs by mouth 3  (three) times daily as needed for cough. (Patient not taking: No sig reported) 120 mL 0   No facility-administered medications prior to visit.      Review of Systems Constitutional:   No  weight loss, night sweats,  Fevers, chills, fatigue, lassitude. HEENT:    headaches,  Difficulty swallowing,  Tooth/dental problems,  Sore throat,                No sneezing, itching, ear ache, nasal congestion, post nasal drip,   CV:  No chest pain,  Orthopnea, PND, swelling in lower extremities, anasarca, dizziness, palpitations  GI  No heartburn, indigestion, abdominal pain, nausea, vomiting, diarrhea, change in bowel habits, loss of appetite  Resp: No shortness of breath with exertion or at  rest.  No excess mucus, no productive cough,   non-productive cough,  No coughing up of blood.  No change in color of mucus.  No wheezing.  No chest wall deformity  Skin: no rash or lesions.  GU: no dysuria, change in color of urine, no urgency or frequency.  No flank pain.  MS:  No joint pain or swelling.  No decreased range of motion.  No back pain.  Psych:   change in mood or affect.  depression or anxiety.   memory loss.     Objective:   Physical Exam Vitals:   10/04/20 1448 10/04/20 1530  BP: (!) 157/84 (!) 145/84  Pulse: 76   Resp: 16   SpO2: 99%   Weight: 175 lb (79.4 kg)     [id number] and alert  ENT: No lesions,  mouth clear,  oropharynx clear, no postnasal drip  Neck: No JVD, no TMG, no carotid bruits  Lungs: No use of accessory muscles, no dullness to percussion, clear  Cardiovascular: RRR, heart sounds normal, no murmur or gallops, no peripheral edema  Abdomen: soft and NT, no HSM,  BS normal  Musculoskeletal: No deformities, no cyanosis or clubbing  Neuro:awake and alert   aphasic   Some drooling, difficult protruding tongue still persists  Skin: Warm, no lesions or rashes  Ct Head Code Stroke Wo Contrast 03/10/2019 1454 1. No acute finding by CT. Extensive chronic  small-vessel ischemic changes throughout the brain as outlined above. Old right frontal infarction. 2. ASPECTS is 10.   Ct Angio Head W Or Wo Contrast Ct Angio Neck W Or Wo Contrast Ct Cerebral Perfusion W Contrast 03/10/2019 1517 1. Fetal type right PCA with proximal occlusion leading to 19 cc of penumbra and no infarct by CT perfusion. There may be a right P1 segment that is also occluded. 2. 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 3. Subjectively advanced left cavernous ICA stenosis. 4. 30-40% atheromatous narrowing at the right ICA bulb.   CT angio/ IR Percutaneous Art Thrombectomy 03/10/2019 S/P 4 vessel cerebral arteriogram followed by completev Revascularization Of RT PCOM and RT PCA with x 1 pass with 62mm x 14mm embotrap retriver device achieving a TICI 2b revascularization. 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 Motion degraded study. 2. Ischemic infarct of the right posterior cerebral artery territory, roughly corresponding to the ischemic volume identified on the earlier CT perfusion scan. 3. No hemorrhage or mass effect. 4. Chronic ischemic microangiopathy and age advanced atrophy.  CT Head WO Contrast 8/72020 Evolving infarct in the right PCA territory. No progression since prior studies. No acute hemorrhage.Atrophy and extensive chronic vessel ischemia.  MRI 05/02/2019 1. Resolving diffusion restriction in the right PCA territory, with patchy residual in the right splenium. Petechial hemorrhage and post ischemic enhancement in the right thalamus and right occipital lobe. No mass effect.  2. Underlying severe chronic ischemic disease with superimposed new small acute to subacute infarcts in the:  - left basal ganglia. - right cerebellar peduncle. 3. 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  2D Echocardiogram 1. The left ventricle has normal systolic function with an ejection fraction of 60-65%. The cavity size was normal. Left ventricular diastolic Doppler parameters are consistent with impaired relaxation. 2. The right ventricle has normal systolic function. The cavity  was normal. There is no increase in right ventricular wall thickness. 3. Left atrial size was mildly dilated. 4. Right atrial size was mildly dilated. 5. The aortic root and ascending aorta are normal in size and structure. 6. The interatrial septum was not assessed.  CXR  05/28/2019 1. No radiographic evidence of acute cardiopulmonary disease. 2. Aortic atherosclerosis.        Assessment & Plan:  I personally reviewed all images and lab data in the [facility] system as well as any outside material available during this office visit and agree with the  radiology impressions.   Benign essential HTN Hypertension adequate control we will continue amlodipine and hydrochlorothiazide without change  Dysphagia due to recent stroke Dysphagia persists  Referral to speech therapy given  Controlled type 2 diabetes mellitus with hyperglycemia, without long-term current use of insulin ([name]) Reasonable control of blood sugar continue Metformin  Abnormality of gait Significant gait abnormality  Discussed case with care manager  Will refer to the pace program  Dyslipidemia, goal LDL below 70 Continue atorvastatin   Joe Perry was seen today for follow-up.  Diagnoses and all orders for this visit:  Type 2 diabetes mellitus with other circulatory complication, without long-term current use of insulin ([name]) -     Glucose (CBG) -     metFORMIN (GLUCOPHAGE) 500 MG tablet; Take 2 tablets (1,000 mg total) by mouth 2 (two) times daily with a meal.  Essential hypertension -     atorvastatin (LIPITOR) 40 MG tablet; One daily in the evening to lower cholesterol -      amLODipine (NORVASC) 5 MG tablet; Take 1 tablet (5 mg total) by mouth daily. To lower blood pressure  History of CVA with residual deficit -     atorvastatin (LIPITOR) 40 MG tablet; One daily in the evening to lower cholesterol -     Ambulatory referral to Speech Therapy  Dyslipidemia, goal LDL below 70 -     atorvastatin (LIPITOR) 40 MG tablet; One daily in the evening to lower cholesterol  Cognitive deficit, post-stroke -     donepezil (ARICEPT) 10 MG tablet; Take 1 tablet (10 mg total) by mouth at bedtime. -     QUEtiapine (SEROQUEL) 25 MG tablet; Take 2 tablets (50 mg total) by mouth at bedtime. -     Ambulatory referral to Speech Therapy  Dysphagia due to recent stroke -     Ambulatory referral to Speech Therapy  Dysphagia, post-stroke -     Ambulatory referral to Speech Therapy  Benign essential HTN  Controlled type 2 diabetes mellitus with hyperglycemia, without long-term current use of insulin ([name])  Abnormality of gait

## 2020-10-04 ENCOUNTER — Telehealth: Payer: Self-pay

## 2020-10-04 ENCOUNTER — Encounter: Payer: Self-pay | Admitting: Critical Care Medicine

## 2020-10-04 ENCOUNTER — Ambulatory Visit: Payer: Medicare Other | Attending: Critical Care Medicine | Admitting: Critical Care Medicine

## 2020-10-04 ENCOUNTER — Other Ambulatory Visit: Payer: Self-pay

## 2020-10-04 VITALS — BP 145/84 | HR 76 | Resp 16 | Wt 175.0 lb

## 2020-10-04 DIAGNOSIS — I69391 Dysphagia following cerebral infarction: Secondary | ICD-10-CM

## 2020-10-04 DIAGNOSIS — E1159 Type 2 diabetes mellitus with other circulatory complications: Secondary | ICD-10-CM | POA: Diagnosis not present

## 2020-10-04 DIAGNOSIS — I69319 Unspecified symptoms and signs involving cognitive functions following cerebral infarction: Secondary | ICD-10-CM

## 2020-10-04 DIAGNOSIS — E785 Hyperlipidemia, unspecified: Secondary | ICD-10-CM | POA: Diagnosis not present

## 2020-10-04 DIAGNOSIS — I6930 Unspecified sequelae of cerebral infarction: Secondary | ICD-10-CM

## 2020-10-04 DIAGNOSIS — R269 Unspecified abnormalities of gait and mobility: Secondary | ICD-10-CM | POA: Diagnosis not present

## 2020-10-04 DIAGNOSIS — I10 Essential (primary) hypertension: Secondary | ICD-10-CM | POA: Diagnosis not present

## 2020-10-04 DIAGNOSIS — E1165 Type 2 diabetes mellitus with hyperglycemia: Secondary | ICD-10-CM | POA: Diagnosis not present

## 2020-10-04 LAB — GLUCOSE, POCT (MANUAL RESULT ENTRY): POC Glucose: 153 mg/dl — AB (ref 70–99)

## 2020-10-04 MED ORDER — AMLODIPINE BESYLATE 5 MG PO TABS: 5.0000 mg | ORAL_TABLET | Freq: Every day | ORAL | 2 refills | Status: DC

## 2020-10-04 MED ORDER — ATORVASTATIN CALCIUM 40 MG PO TABS: ORAL_TABLET | ORAL | 2 refills | Status: DC

## 2020-10-04 MED ORDER — QUETIAPINE FUMARATE 25 MG PO TABS: 50.0000 mg | ORAL_TABLET | Freq: Every day | ORAL | 3 refills | Status: DC

## 2020-10-04 MED ORDER — DONEPEZIL HCL 10 MG PO TABS: 10.0000 mg | ORAL_TABLET | Freq: Every day | ORAL | 0 refills | Status: DC

## 2020-10-04 MED ORDER — METFORMIN HCL 500 MG PO TABS: 1000.0000 mg | ORAL_TABLET | Freq: Two times a day (BID) | ORAL | 1 refills | Status: DC

## 2020-10-04 NOTE — Telephone Encounter (Signed)
Met with the patient and his wife when they were in the clinic today.  She is requesting some assistance with his care to allow her to take a break/sleep.    She said he is not eligible for medicaid or PCS.  She has not contacted [facility] about their program and his eligibility for special assistance medicaid. Provided her with the number for [facility] # [phone] and encouraged her to call. She was not interested in facility placement for him at this time.   Explained to her the services offered by [facility] program and she was very interested.  Provided her with the contact # and address for [facility] and she said she would go there tomorrow.   She is also requesting letters from Dr Joya Gaskins for her sons to be able to leave their countries and come to [place].  The letters will need to go to the embassies in their respective countries and she will provide this CM with that information when she gets home. Sons: Reather Converse in [place] and Lesley Galentine in [place].  She would like them to come to see their father and provide some respite for her

## 2020-10-04 NOTE — Patient Instructions (Signed)
No change in medications  A jury duty excuse letter was issued  We will partner with our nurse case manager to see if there is any long-term care options for your husband based on your insurance coverage  Referral back to speech pathology will be made for evaluation of swallowing  Return to see Dr. Joya Gaskins 2 months

## 2020-10-04 NOTE — Progress Notes (Signed)
Concerns with holding saliva in his mouth  - aphasia, dysphagia No longer seeing speech therapy  Needs letter to excuse him for jury duty.

## 2020-10-04 NOTE — Assessment & Plan Note (Signed)
Significant gait abnormality  Discussed case with care manager  Will refer to the pace program

## 2020-10-04 NOTE — Assessment & Plan Note (Signed)
Dysphagia persists  Referral to speech therapy given

## 2020-10-04 NOTE — Assessment & Plan Note (Signed)
Hypertension adequate control we will continue amlodipine and hydrochlorothiazide without change

## 2020-10-04 NOTE — Assessment & Plan Note (Signed)
Reasonable control of blood sugar continue Metformin

## 2020-10-04 NOTE — Assessment & Plan Note (Signed)
Continue atorvastatin

## 2020-10-09 ENCOUNTER — Encounter (HOSPITAL_COMMUNITY): Payer: Self-pay | Admitting: Emergency Medicine

## 2020-10-09 ENCOUNTER — Ambulatory Visit (HOSPITAL_COMMUNITY)
Admission: EM | Admit: 2020-10-09 | Discharge: 2020-10-09 | Disposition: A | Discharge status: Home / Self Care | Payer: Medicare Other | Attending: Family Medicine | Admitting: Family Medicine

## 2020-10-09 ENCOUNTER — Ambulatory Visit (INDEPENDENT_AMBULATORY_CARE_PROVIDER_SITE_OTHER): Payer: Medicare Other

## 2020-10-09 ENCOUNTER — Other Ambulatory Visit: Payer: Self-pay

## 2020-10-09 DIAGNOSIS — Z79899 Other long term (current) drug therapy: Secondary | ICD-10-CM | POA: Insufficient documentation

## 2020-10-09 DIAGNOSIS — R059 Cough, unspecified: Secondary | ICD-10-CM | POA: Diagnosis not present

## 2020-10-09 DIAGNOSIS — R051 Acute cough: Secondary | ICD-10-CM | POA: Insufficient documentation

## 2020-10-09 DIAGNOSIS — J309 Allergic rhinitis, unspecified: Secondary | ICD-10-CM | POA: Diagnosis not present

## 2020-10-09 DIAGNOSIS — Z7982 Long term (current) use of aspirin: Secondary | ICD-10-CM | POA: Diagnosis not present

## 2020-10-09 DIAGNOSIS — R600 Localized edema: Secondary | ICD-10-CM | POA: Insufficient documentation

## 2020-10-09 DIAGNOSIS — R609 Edema, unspecified: Secondary | ICD-10-CM | POA: Diagnosis not present

## 2020-10-09 DIAGNOSIS — Z7984 Long term (current) use of oral hypoglycemic drugs: Secondary | ICD-10-CM | POA: Diagnosis not present

## 2020-10-09 DIAGNOSIS — R2689 Other abnormalities of gait and mobility: Secondary | ICD-10-CM | POA: Diagnosis not present

## 2020-10-09 DIAGNOSIS — Z20822 Contact with and (suspected) exposure to covid-19: Secondary | ICD-10-CM | POA: Diagnosis not present

## 2020-10-09 DIAGNOSIS — I69328 Other speech and language deficits following cerebral infarction: Secondary | ICD-10-CM | POA: Diagnosis not present

## 2020-10-09 NOTE — Discharge Instructions (Signed)
We will let you know if your covid test comes back positive. Try mucinex, delsym, antihistamines to see if that helps your cough

## 2020-10-09 NOTE — ED Triage Notes (Signed)
Patient c/o bilateral leg swelling and productive cough w/ " white" sputum production x 1 day.   Patient has edema present upon exam.   Patients caregiver denies any SOB.   Patient's caregiver denies pain.   Patients caregiver denies any heart problems.   History of Stroke.

## 2020-10-10 LAB — SARS CORONAVIRUS 2 (TAT 6-24 HRS): SARS Coronavirus 2: NEGATIVE

## 2020-10-12 NOTE — ED Provider Notes (Signed)
[place]    CSN: [id number] Arrival date & time: 10/09/20  1706      History   Chief Complaint Chief Complaint  Patient presents with  . Leg Swelling  . Cough    HPI Joe RUTLEDGE Sr. is a 70 y.o. male.   Here today with 1 day history of b/l lower leg swelling, productive cough with white sputum. Denies wheezing, SOB, pain, hx of cardiac issues, sick contacts. States hx of allergic rhinitis not on any medications. Not trying anything OTC for sxs. Hx of CVA now with speech and mobility deficits and has not been as ambulatory as usual recently.      Past Medical History:  Diagnosis Date  . 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 ([place]) 06/03/2019  . 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 Diabetes mellitus without complication ([place])   . Dysphagia    post stroke  . Hypertension   . Stroke [facility])     Patient Active Problem List   Diagnosis Date Noted  . Benign essential HTN   . Cognitive deficit, post-stroke   . Dysphagia, post-stroke   . Controlled type 2 diabetes mellitus with hyperglycemia, without long-term current use of insulin ([place])   . 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 Dyslipidemia, goal LDL below 70 03/12/2019  . Dysphagia due to recent stroke 03/12/2019  . Overweight 03/12/2019  . 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 Abnormality of gait   . Gait disturbance, post-stroke   . 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    Past Surgical History:  Procedure Laterality Date  . IR ANGIO INTRA EXTRACRAN SEL COM CAROTID INNOMINATE UNI L MOD SED  03/10/2019  . IR ANGIO VERTEBRAL SEL SUBCLAVIAN INNOMINATE BILAT MOD SED  03/10/2019  . IR CT HEAD LTD  03/10/2019  . IR PERCUTANEOUS ART THROMBECTOMY/INFUSION INTRACRANIAL INC DIAG  ANGIO  03/10/2019  . RADIOLOGY WITH ANESTHESIA N/A 03/10/2019   Procedure: IR WITH ANESTHESIA/CODE STROKE;  Surgeon: Radiologist, Medication, MD;  Location: [place];  Service: Radiology;  Laterality: N/A;       Home Medications    Prior to Admission medications   Medication Sig Start Date End Date Taking? Authorizing Provider  amLODipine (NORVASC) 5 MG tablet Take 1 tablet (5 mg total) by mouth daily. To lower blood pressure 10/04/20  Yes Elsie Stain, MD  aspirin EC 81 MG tablet Take 1 tablet (81 mg total) by mouth daily. 07/26/20  Yes Elsie Stain, MD  atorvastatin (LIPITOR) 40 MG tablet One daily in the evening to lower cholesterol 10/04/20  Yes Elsie Stain, MD  donepezil (ARICEPT) 10 MG tablet Take 1 tablet (10 mg total) by mouth at bedtime. 10/04/20  Yes Elsie Stain, MD  metFORMIN (GLUCOPHAGE) 500 MG tablet Take 2 tablets (1,000 mg total) by mouth 2 (two) times daily with a meal. 10/04/20 11/03/20 Yes Elsie Stain, MD  QUEtiapine (SEROQUEL) 25 MG tablet Take 2 tablets (50 mg total) by mouth at bedtime. 10/04/20  Yes Elsie Stain, MD  acetaminophen (TYLENOL) 325 MG tablet Take 2 tablets (650 mg total) by mouth every 4 (four) hours as needed for mild pain (or temp > 37.5 C (99.5 F)). 06/19/19   Angiulli, Lavon Paganini, PA-C    Family History Family History  Problem Relation Age  of Onset  . Diabetes Other   . Hypertension Other   . Healthy Mother   . Healthy Father     Social History Social History   Tobacco Use  . Smoking status: Never Smoker  . Smokeless tobacco: Never Used  Vaping Use  . Vaping Use: Never used  Substance Use Topics  . Alcohol use: No  . Drug use: No     Allergies   Patient has no known allergies.   Review of Systems Review of Systems PER HPI   Physical Exam Triage Vital Signs ED Triage Vitals  Enc Vitals Group     BP 10/09/20 1718 (!) 155/81     Pulse Rate 10/09/20 1718 82     Resp 10/09/20 1718 16     Temp 10/09/20 1718  98.4 F (36.9 C)     Temp Source 10/09/20 1718 Oral     SpO2 10/09/20 1718 97 %     Weight --      Height --      Head Circumference --      Peak Flow --      Pain Score 10/09/20 1804 0     Pain Loc --      Pain Edu? --      Excl. in [place]? --    No data found.  Updated Vital Signs BP (!) 155/81 (BP Location: Right Arm)   Pulse 82   Temp 98.4 F (36.9 C) (Oral)   Resp 16   SpO2 97%   Visual Acuity Right Eye Distance:   Left Eye Distance:   Bilateral Distance:    Right Eye Near:   Left Eye Near:    Bilateral Near:     Physical Exam Vitals and nursing note reviewed.  Constitutional:      Appearance: Normal appearance.  HENT:     Head: Atraumatic.     Right Ear: Tympanic membrane normal.     Left Ear: Tympanic membrane normal.     Nose: Nose normal.     Mouth/Throat:     Mouth: Mucous membranes are moist.     Pharynx: Oropharynx is clear.  Eyes:     Extraocular Movements: Extraocular movements intact.     Conjunctiva/sclera: Conjunctivae normal.     Pupils: Pupils are equal, round, and reactive to light.  Cardiovascular:     Rate and Rhythm: Normal rate and regular rhythm.     Pulses: Normal pulses.  Pulmonary:     Effort: Pulmonary effort is normal. No respiratory distress.     Breath sounds: Normal breath sounds. No wheezing or rales.  Abdominal:     General: Bowel sounds are normal. There is no distension.     Palpations: Abdomen is soft.     Tenderness: There is no abdominal tenderness. There is no guarding.  Musculoskeletal:        General: Swelling (trace edema b/l feet, ankles and lower legs, nontender, no discoloration) present.     Cervical back: Normal range of motion and neck supple.     Comments: In wheelchair, which wife states is current baseline  Skin:    General: Skin is warm and dry.     Findings: No bruising, erythema, lesion or rash.  Neurological:     Mental Status: He is oriented to person, place, and time. Mental status is at baseline.   Psychiatric:        Mood and Affect: Mood normal.        Thought Content:  Thought content normal.        Judgment: Judgment normal.      UC Treatments / Results  Labs (all labs ordered are listed, but only abnormal results are displayed) Labs Reviewed  SARS CORONAVIRUS 2 (TAT 6-24 HRS)    EKG   Radiology No results found.  Procedures Procedures (including critical care time)  Medications Ordered in UC Medications - No data to display  Initial Impression / Assessment and Plan / UC Course  I have reviewed the triage vital signs and the nursing notes.  Pertinent labs & imaging results that were available during my care of the patient were reviewed by me and considered in my medical decision making (see chart for details).    Vital signs, exam, and CXR reassuring today. Will test for COVID for r/o, suspect allergic rhinitis causing his cough and recommended good allergy regimen daily. Compression stockings, increased ambulation as tolerated, leg elevation recommended for his mild leg edema. Close PCP f/u for recheck recommended, ED for acutely worsening sxs.    Final Clinical Impressions(s) / UC Diagnoses   Final diagnoses:  Cough  Peripheral edema     Discharge Instructions     We will let you know if your covid test comes back positive. Try mucinex, delsym, antihistamines to see if that helps your cough    ED Prescriptions    None     PDMP not reviewed this encounter.   Volney American, [place] 10/12/20 1527

## 2020-10-13 ENCOUNTER — Other Ambulatory Visit (HOSPITAL_COMMUNITY): Payer: Self-pay

## 2020-10-13 ENCOUNTER — Telehealth (HOSPITAL_COMMUNITY): Payer: Self-pay

## 2020-10-13 DIAGNOSIS — R1310 Dysphagia, unspecified: Secondary | ICD-10-CM

## 2020-10-21 ENCOUNTER — Ambulatory Visit (HOSPITAL_COMMUNITY)
Admission: RE | Admit: 2020-10-21 | Discharge: 2020-10-21 | Disposition: A | Discharge status: Home / Self Care | Payer: Medicare Other | Source: Ambulatory Visit | Attending: Critical Care Medicine | Admitting: Critical Care Medicine

## 2020-10-21 ENCOUNTER — Other Ambulatory Visit: Payer: Self-pay

## 2020-10-21 DIAGNOSIS — I6300 Cerebral infarction due to thrombosis of unspecified precerebral artery: Secondary | ICD-10-CM | POA: Diagnosis not present

## 2020-10-21 DIAGNOSIS — I69391 Dysphagia following cerebral infarction: Secondary | ICD-10-CM

## 2020-10-21 DIAGNOSIS — T17320A Food in larynx causing asphyxiation, initial encounter: Secondary | ICD-10-CM | POA: Diagnosis not present

## 2020-10-21 DIAGNOSIS — R1310 Dysphagia, unspecified: Secondary | ICD-10-CM | POA: Diagnosis not present

## 2020-10-25 ENCOUNTER — Encounter: Payer: Self-pay | Admitting: Speech Pathology

## 2020-10-25 ENCOUNTER — Ambulatory Visit: Payer: Medicare Other | Attending: Critical Care Medicine | Admitting: Speech Pathology

## 2020-10-25 ENCOUNTER — Telehealth: Payer: Self-pay | Admitting: Critical Care Medicine

## 2020-10-25 ENCOUNTER — Other Ambulatory Visit: Payer: Self-pay

## 2020-10-25 DIAGNOSIS — I6300 Cerebral infarction due to thrombosis of unspecified precerebral artery: Secondary | ICD-10-CM

## 2020-10-25 DIAGNOSIS — I69398 Other sequelae of cerebral infarction: Secondary | ICD-10-CM

## 2020-10-25 DIAGNOSIS — R270 Ataxia, unspecified: Secondary | ICD-10-CM

## 2020-10-25 DIAGNOSIS — R4701 Aphasia: Secondary | ICD-10-CM

## 2020-10-25 DIAGNOSIS — I69391 Dysphagia following cerebral infarction: Secondary | ICD-10-CM

## 2020-10-25 DIAGNOSIS — R269 Unspecified abnormalities of gait and mobility: Secondary | ICD-10-CM

## 2020-10-25 DIAGNOSIS — R41841 Cognitive communication deficit: Secondary | ICD-10-CM | POA: Insufficient documentation

## 2020-10-25 NOTE — Telephone Encounter (Signed)
PT ordre for [place] [facility] done

## 2020-10-25 NOTE — Patient Instructions (Signed)
Continue to provide verbal and tactile (touch) cues for patient for swallowing secretions.   Be patient with Joe Perry due to delayed initiation and severe cognitive impairment.

## 2020-10-25 NOTE — Therapy (Signed)
[place]. [place], [place], [address] Phone: [phone]   Fax:  [phone]  Speech Language Pathology Evaluation  Patient Details  Name: Joe MANCINAS Sr. MRN: [id number] Date of Birth: 06/03/1951 Referring Provider (SLP): Elsie Stain, MD   Encounter Date: 10/25/2020   End of Session - 10/25/20 1141    Visit Number 1    Number of Visits 25    Date for SLP Re-Evaluation 01/22/21    SLP Start Time 0930    SLP Stop Time  6962    SLP Time Calculation (min) 45 min    Activity Tolerance Patient limited by fatigue           Past Medical History:  Diagnosis Date  . 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 Cerebrovascular accident (CVA) due to occlusion of vertebral artery ([place])   . Diabetes mellitus without complication ([place])   . Dysphagia    post stroke  . Hypertension   . Stroke [facility])     Past Surgical History:  Procedure Laterality Date  . IR ANGIO INTRA EXTRACRAN SEL COM CAROTID INNOMINATE UNI L MOD SED  03/10/2019  . IR ANGIO VERTEBRAL SEL SUBCLAVIAN INNOMINATE BILAT MOD SED  03/10/2019  . IR CT HEAD LTD  03/10/2019  . IR PERCUTANEOUS ART THROMBECTOMY/INFUSION INTRACRANIAL INC DIAG ANGIO  03/10/2019  . RADIOLOGY WITH ANESTHESIA N/A 03/10/2019   Procedure: IR WITH ANESTHESIA/CODE STROKE;  Surgeon: Radiologist, Medication, MD;  Location: [facility];  Service: Radiology;  Laterality: N/A;    There were no vitals filed for this visit.   Subjective Assessment - 10/25/20 0939    Subjective Pt is alert. Coughing throughout session on saliva.    Patient is accompained by: Family member   Wife   Currently in Pain? No/denies              SLP Evaluation [facility] - 10/25/20 1129      SLP Visit Information   SLP Received On 10/25/20    Referring Provider (SLP) Elsie Stain, MD      General Information   HPI Pt is a 70 y.o. male s/p CVA  in March and July 2020.  Pt only received a few visits of home health due to financial concerns/no insurance per wife, but pt continues to demo difficulty with swallowing, mobility, ADLs, and cognition.  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  Pt with PMH that includes: prior CVA 2017, 11/2018, 03/2019, DM, dyslipidemia, HTN, urinary incontinence.    Behavioral/Cognition Somnolent      Balance Screen   Has the patient fallen in the past 6 months No    Has the patient had a decrease in activity level because of a fear of falling?  No    Is the patient reluctant to leave their home because of a fear of falling?  No      Prior Functional Status   Cognitive/Linguistic Baseline Within functional limits   Prior to previous CVA in 2020.   Type of Home House     Lives With Spouse    Available Support Family    Vocation Retired      Associate Professor   Overall Cognitive Status Impaired/Different from baseline    Area of Impairment Attention;Memory;Following commands;Safety/judgement;Problem solving    Current Attention Level Sustained    Memory Decreased short-term memory  Following Commands Follows one step commands inconsistently    Safety/Judgement Decreased awareness of safety;Decreased awareness of deficits    Problem Solving Slow processing;Decreased initiation;Difficulty sequencing;Requires verbal cues;Requires tactile cues    Executive Function Initiating;Reasoning;Sequencing;Organizing;Decision Making;Self Monitoring;Self Correcting      Auditory Comprehension   Overall Auditory Comprehension Impaired    EffectiveTechniques Extra processing time;Visual/Gestural cues;Repetition      Expression   Primary Mode of Expression Verbal      Verbal Expression   Overall Verbal Expression Impaired    Initiation Impaired    Interfering Components Attention      Written Expression   Dominant Hand Right    Written  Expression Exceptions to [facility]      Oral Motor/Sensory Function   Overall Oral Motor/Sensory Function Impaired    Labial Coordination Reduced    Lingual ROM Reduced right    Lingual Symmetry Within Functional Limits    Lingual Coordination Reduced      Motor Speech   Phonation Low vocal intensity    Resonance Within functional limits    Articulation Within functional limitis    Intelligibility Intelligibility reduced    Conversation 75-100% accurate    Effective Techniques Increased vocal intensity      Standardized Assessments   Standardized Assessments  Other Assessment    Other Assessment SLUMS      Assessment   SLP Visit Diagnosis Cognitive communication deficit (R41.841)      SLP Recommendations   Follow up Recommendations Outpatient SLP             SLU Mental Status (SLUMS Examination)  Orientation: 2/3 Delayed Recall w/ Interference: 0/5 Numeric Calculation and Registration: 0/3 Immediate Recall w/ Interference (Generative naming):0 /3 Registration and Digit Span: 0/2 Visual Spatial/Exec Functioning: 2/6 Executive Functioning/Extrapolation:  0/8   SCORE: 4/30              SLP Education - 10/25/20 1139    Education Details Severity of cognitive-communication deficit; patience with communication due to severe impairment in initiation.    Person(s) Educated Patient;Spouse    Methods Explanation    Comprehension Verbalized understanding;Need further instruction;Verbal cues required            SLP Short Term Goals - 10/25/20 1153      SLP SHORT TERM GOAL #1   Title Pt will make a choice between items in given list of options provided with <2 verbal/visual cues.    Time 6    Period Weeks    Status New      SLP SHORT TERM GOAL #2   Title Pt will swallow saliva prior to each communicative interaction given a <2 verbal/tactile cues.    Time 6    Period Weeks    Status New      SLP SHORT TERM GOAL #3   Title Pt will demonstrate sustained  attention to task given a personally relevant task for >5 minutes.    Baseline <5 min    Time 6    Period Weeks    Status New            SLP Long Term Goals - 10/25/20 1156      SLP LONG TERM GOAL #1   Title Client will utilize compensatory strategies to communicate wants and needs effectively to different conversational partners and participate socially in functional living environment.    Time 12    Period Weeks    Status New      SLP LONG TERM GOAL #  2   Title Client will demonstrate understanding of verbal communication related to daily activities and utilize compensatory strategies to more effectively communicate in their functional living environment.    Time 12    Period Weeks    Status New      SLP LONG TERM GOAL #3   Title Patient will develop functional attention skills to effectively attend to and communicate in tasks of daily living in their functional living environment.    Time 12    Period Weeks    Status New            Plan - 10/25/20 1142    Clinical Impression Statement Pt is a 70 yo male who presents with severe cognitive-communication impairment and dysphagia 2/2 to cognitive impairment. Pt wife accompanied pt and required encouragement to participate in answering questions regarding impairments seen at home. Pt required verbal and tactile cueing to swallow saliva before speaking. With cueing before each communicative interaction, pt was able to participate in today's evaluation verbally. Pt experienced significantly delayed initation in answering questions and following directions. Pt wife reported her primary concern is with his lack of communication of his needs/wants. She reports mostly mumbling at home with occasional greetings. SLUMS was conducted this date to determine extent of cognitive deficit. Patient required repetition, visual, and verbal cueing to complete. Patient received a 4/30 on today's assessment; however, pt demonstrated increased accuracy  with verbal cueing and when provided with multiple choice cue. SLP rec skilled speech services to increase patient's ability to communicate needs/wants and further participation in ADLs to relieve caregiver burden.    Speech Therapy Frequency 2x / week    Duration 12 weeks    Treatment/Interventions Aspiration precaution training;Cueing hierarchy;Patient/family education;Functional tasks;Environmental controls;Cognitive reorganization;Multimodal communcation approach;Language facilitation;Compensatory techniques;Internal/external aids;SLP instruction and feedback;Compensatory strategies    Potential to Achieve Goals Fair    Potential Considerations Ability to learn/carryover information;Severity of impairments    SLP Home Exercise Plan To provide next session.    Consulted and Agree with Plan of Care Patient;Family member/caregiver    Family Member Consulted Pt wife           Patient will benefit from skilled therapeutic intervention in order to improve the following deficits and impairments:   Aphasia  Cognitive communication deficit    Problem List Patient Active Problem List   Diagnosis Date Noted  . Benign essential HTN   . Cognitive deficit, post-stroke   . Dysphagia, post-stroke   . Controlled type 2 diabetes mellitus with hyperglycemia, without long-term current use of insulin ([place])   . Vestibular schwannoma ([place]) 06/03/2019  . 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 Dyslipidemia, goal LDL below 70 03/12/2019  . Dysphagia due to recent stroke 03/12/2019  . Overweight 03/12/2019  . 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 Abnormality of gait   . Gait disturbance, post-stroke   . 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    Verdene Lennert, MS [place], [name] 10/25/2020, 11:59 AM  [place]. [place],  [place], [address] Phone: [phone]   Fax:  [phone]  Name: STRYKER VEASEY Sr. MRN: [id number] Date of Birth: 12-25-1950

## 2020-10-25 NOTE — Telephone Encounter (Signed)
-----   Message from Verdene Lennert, [place] sent at 10/25/2020 12:02 PM EST ----- Regarding: Physical Therapy Referral Contact: [phone] Dr. Joya Gaskins -   Would you mind to send a referral over for physical therapy to [place] location? I am to see the patient here at the new location for speech therapy and patient wife was looking to complete PT here as well.   Please call if any additional questions. Thank you for your time!  [place], MS CCC-SLP, [name]

## 2020-10-28 ENCOUNTER — Other Ambulatory Visit: Payer: Self-pay

## 2020-10-28 ENCOUNTER — Encounter: Payer: Self-pay | Admitting: Speech Pathology

## 2020-10-28 ENCOUNTER — Ambulatory Visit: Payer: Medicare Other | Admitting: Speech Pathology

## 2020-10-28 DIAGNOSIS — R4701 Aphasia: Secondary | ICD-10-CM | POA: Diagnosis not present

## 2020-10-28 DIAGNOSIS — R41841 Cognitive communication deficit: Secondary | ICD-10-CM

## 2020-10-28 NOTE — Therapy (Signed)
[place]. [place], [place], [address] Phone: [phone]   Fax:  [phone]  Speech Language Pathology Treatment  Patient Details  Name: Joe FLIPPEN Sr. MRN: [id number] Date of Birth: 05/21/1951 Referring Provider (SLP): Elsie Stain, MD   Encounter Date: 10/28/2020   End of Session - 10/28/20 1351    Visit Number 2    Number of Visits 25    Date for SLP Re-Evaluation 01/22/21    SLP Start Time 1315    SLP Stop Time  1400    SLP Time Calculation (min) 45 min    Activity Tolerance Patient tolerated treatment well           Past Medical History:  Diagnosis Date  . 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 Cerebrovascular accident (CVA) due to occlusion of vertebral artery ([place])   . Diabetes mellitus without complication ([employer])   . Dysphagia    post stroke  . Hypertension   . Stroke [facility])     Past Surgical History:  Procedure Laterality Date  . IR ANGIO INTRA EXTRACRAN SEL COM CAROTID INNOMINATE UNI L MOD SED  03/10/2019  . IR ANGIO VERTEBRAL SEL SUBCLAVIAN INNOMINATE BILAT MOD SED  03/10/2019  . IR CT HEAD LTD  03/10/2019  . IR PERCUTANEOUS ART THROMBECTOMY/INFUSION INTRACRANIAL INC DIAG ANGIO  03/10/2019  . RADIOLOGY WITH ANESTHESIA N/A 03/10/2019   Procedure: IR WITH ANESTHESIA/CODE STROKE;  Surgeon: Radiologist, Medication, MD;  Location: [facility];  Service: Radiology;  Laterality: N/A;    There were no vitals filed for this visit.   Subjective Assessment - 10/28/20 1336    Subjective Pt is more alert this session.    Currently in Pain? No/denies                 ADULT SLP TREATMENT - 10/28/20 0001      General Information   Behavior/Cognition Alert;Cooperative;Pleasant mood;Distractible;Decreased sustained attention      Treatment Provided   Treatment provided Cognitive-Linquistic      Cognitive-Linquistic Treatment    Treatment focused on Cognition    Skilled Treatment Sustained attention for orientation. Spaced retrieval and written cues utilized. At the end of session, pt was able to recall year with a single verbal cue. Pt was unable to write personally-relevant important information (DOB) given mod-to-max cueing. SLP required pt to read information off board. He required less cues for swallowing saliva when engaged in conversation.      Assessment / Recommendations / Plan   Plan Continue with current plan of care      Progression Toward Goals   Progression toward goals Progressing toward goals              SLP Short Term Goals - 10/28/20 1405      SLP SHORT TERM GOAL #1   Title Pt will make a choice between items in given list of options provided with <2 verbal/visual cues.    Time 6    Period Weeks    Status On-going      SLP SHORT TERM GOAL #2   Title Pt will swallow saliva prior to each communicative interaction given a <2 verbal/tactile cues.    Time 6    Period Weeks    Status On-going      SLP SHORT TERM GOAL #3   Title Pt will demonstrate sustained attention to task given a personally relevant task for >  5 minutes.    Baseline <5 min    Time 6    Period Weeks    Status On-going            SLP Long Term Goals - 10/28/20 1405      SLP LONG TERM GOAL #1   Title Client will utilize compensatory strategies to communicate wants and needs effectively to different conversational partners and participate socially in functional living environment.    Time 12    Period Weeks    Status On-going      SLP LONG TERM GOAL #2   Title Client will demonstrate understanding of verbal communication related to daily activities and utilize compensatory strategies to more effectively communicate in their functional living environment.    Time 12    Period Weeks    Status On-going      SLP LONG TERM GOAL #3   Title Patient will develop functional attention skills to effectively attend to  and communicate in tasks of daily living in their functional living environment.    Time 12    Period Weeks    Status On-going            Plan - 10/28/20 1404    Clinical Impression Statement Pt is a 70 yo male who presents with severe cognitive-communication impairment and dysphagia 2/2 to cognitive impairment. Pt wife accompanied pt and required encouragement to participate in answering questions regarding impairments seen at home. Pt required verbal and tactile cueing to swallow saliva before speaking. With cueing before each communicative interaction, pt was able to participate in today's evaluation verbally. Pt experienced significantly delayed initation in answering questions and following directions. Pt wife reported her primary concern is with his lack of communication of his needs/wants. She reports mostly mumbling at home with occasional greetings. SLUMS was conducted this date to determine extent of cognitive deficit. Patient required repetition, visual, and verbal cueing to complete. Patient received a 4/30 on today's assessment; however, pt demonstrated increased accuracy with verbal cueing and when provided with multiple choice cue. SLP rec skilled speech services to increase patient's ability to communicate needs/wants and further participation in ADLs to relieve caregiver burden.    Speech Therapy Frequency 2x / week    Duration 12 weeks    Treatment/Interventions Aspiration precaution training;Cueing hierarchy;Patient/family education;Functional tasks;Environmental controls;Cognitive reorganization;Multimodal communcation approach;Language facilitation;Compensatory techniques;Internal/external aids;SLP instruction and feedback;Compensatory strategies    Potential to Achieve Goals Fair    Potential Considerations Ability to learn/carryover information;Severity of impairments    SLP Home Exercise Plan To provide next session.    Consulted and Agree with Plan of Care Patient;Family  member/caregiver    Family Member Consulted Pt wife           Patient will benefit from skilled therapeutic intervention in order to improve the following deficits and impairments:   Cognitive communication deficit    Problem List Patient Active Problem List   Diagnosis Date Noted  . Benign essential HTN   . Cognitive deficit, post-stroke   . Dysphagia, post-stroke   . Controlled type 2 diabetes mellitus with hyperglycemia, without long-term current use of insulin ([place])   . 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 Dyslipidemia, goal LDL below 70 03/12/2019  . Dysphagia due to recent stroke 03/12/2019  . Overweight 03/12/2019  . 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 Abnormality of  gait   . Gait disturbance, post-stroke   . 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    Verdene Lennert, MS [place], [name] 10/28/2020, 2:10 PM  [place]. [place], [place], [address] Phone: [phone]   Fax:  [phone]   Name: Joe GUIDROZ Sr. MRN: [id number] Date of Birth: May 30, 1951

## 2020-11-01 ENCOUNTER — Encounter: Payer: Self-pay | Admitting: Speech Pathology

## 2020-11-01 ENCOUNTER — Other Ambulatory Visit: Payer: Self-pay

## 2020-11-01 ENCOUNTER — Ambulatory Visit: Payer: Medicare Other | Admitting: Speech Pathology

## 2020-11-01 DIAGNOSIS — R4701 Aphasia: Secondary | ICD-10-CM

## 2020-11-01 DIAGNOSIS — R41841 Cognitive communication deficit: Secondary | ICD-10-CM

## 2020-11-01 NOTE — Patient Instructions (Signed)
Help Paco with naming objects around the house. Provide multiple choice cues, (ex: Do you want STEAK or CHICKEN?)

## 2020-11-01 NOTE — Therapy (Signed)
[place]. [place], [place], [address] Phone: [phone]   Fax:  [phone]  Speech Language Pathology Treatment  Patient Details  Name: Joe RADNEY Sr. MRN: [id number] Date of Birth: Feb 24, 1951 Referring Provider (SLP): Elsie Stain, MD   Encounter Date: 11/01/2020   End of Session - 11/01/20 1323    Visit Number 3    Number of Visits 25    Date for SLP Re-Evaluation 01/22/21    SLP Start Time 91    SLP Stop Time  1400    SLP Time Calculation (min) 42 min    Activity Tolerance Patient tolerated treatment well           Past Medical History:  Diagnosis Date  . 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 Diabetes mellitus without complication ([place])   . Dysphagia    post stroke  . Hypertension   . Stroke [facility])     Past Surgical History:  Procedure Laterality Date  . IR ANGIO INTRA EXTRACRAN SEL COM CAROTID INNOMINATE UNI L MOD SED  03/10/2019  . IR ANGIO VERTEBRAL SEL SUBCLAVIAN INNOMINATE BILAT MOD SED  03/10/2019  . IR CT HEAD LTD  03/10/2019  . IR PERCUTANEOUS ART THROMBECTOMY/INFUSION INTRACRANIAL INC DIAG ANGIO  03/10/2019  . RADIOLOGY WITH ANESTHESIA N/A 03/10/2019   Procedure: IR WITH ANESTHESIA/CODE STROKE;  Surgeon: Radiologist, Medication, MD;  Location: [facility];  Service: Radiology;  Laterality: N/A;    There were no vitals filed for this visit.   Subjective Assessment - 11/01/20 1321    Subjective "I am doing alright."    Patient is accompained by: Family member    Currently in Pain? No/denies                 ADULT SLP TREATMENT - 11/01/20 1338      Treatment Provided   Treatment provided Cognitive-Linquistic      Cognitive-Linquistic Treatment   Treatment focused on Cognition    Skilled Treatment Sustained attention for  orientation. Spaced retrieval and written cues utilized.  Utilized organization task to name and classify items into (fruit or vegetable) (60%). Pt had significant difficulty naming items, SLP named item for pt and he classified. Pt benfited from multiple choice cueing for generative naming. He required less cues for swallowing saliva when engaged in conversation.      Assessment / Recommendations / Plan   Plan Continue with current plan of care      Progression Toward Goals   Progression toward goals Progressing toward goals              SLP Short Term Goals - 11/01/20 1335      SLP SHORT TERM GOAL #1   Title Pt will make a choice between items in given list of options provided with <2 verbal/visual cues.    Time 5    Period Weeks    Status On-going      SLP SHORT TERM GOAL #2   Title Pt will swallow saliva prior to each communicative interaction given a <2 verbal/tactile cues.    Time 5    Period Weeks    Status On-going      SLP SHORT TERM GOAL #3   Title Pt will demonstrate sustained attention to task given a personally relevant task for >5 minutes.    Baseline <  5 min    Time 5    Period Weeks    Status On-going            SLP Long Term Goals - 11/01/20 1336      SLP LONG TERM GOAL #1   Title Client will utilize compensatory strategies to communicate wants and needs effectively to different conversational partners and participate socially in functional living environment.    Time 11    Period Weeks    Status On-going      SLP LONG TERM GOAL #2   Title Client will demonstrate understanding of verbal communication related to daily activities and utilize compensatory strategies to more effectively communicate in their functional living environment.    Time 11    Period Weeks    Status On-going      SLP LONG TERM GOAL #3   Title Patient will develop functional attention skills to effectively attend to and communicate in tasks of daily living in their functional  living environment.    Time 11    Period Weeks    Status On-going            Plan - 11/01/20 1333    Clinical Impression Statement Pt is a 70 yo male who presents with severe cognitive-communication impairment and dysphagia 2/2 to cognitive impairment. Pt wife accompanied pt and required encouragement to participate in answering questions regarding impairments seen at home. Pt required verbal and tactile cueing to swallow saliva before speaking. With cueing before each communicative interaction, pt was able to participate in today's evaluation verbally. Pt experienced significantly delayed initation in answering questions and following directions. Pt wife reported her primary concern is with his lack of communication of his needs/wants. She reports mostly mumbling at home with occasional greetings. SLUMS was conducted this date to determine extent of cognitive deficit. Patient required repetition, visual, and verbal cueing to complete. Patient received a 4/30 on today's assessment; however, pt demonstrated increased accuracy with verbal cueing and when provided with multiple choice cue. SLP rec skilled speech services to increase patient's ability to communicate needs/wants and further participation in ADLs to relieve caregiver burden.    Speech Therapy Frequency 2x / week    Duration 12 weeks    Treatment/Interventions Aspiration precaution training;Cueing hierarchy;Patient/family education;Functional tasks;Environmental controls;Cognitive reorganization;Multimodal communcation approach;Language facilitation;Compensatory techniques;Internal/external aids;SLP instruction and feedback;Compensatory strategies    Potential to Achieve Goals Fair    Potential Considerations Ability to learn/carryover information;Severity of impairments    SLP Home Exercise Plan To provide next session.    Consulted and Agree with Plan of Care Patient;Family member/caregiver    Family Member Consulted Pt wife            Patient will benefit from skilled therapeutic intervention in order to improve the following deficits and impairments:   Cognitive communication deficit  Aphasia    Problem List Patient Active Problem List   Diagnosis Date Noted  . Benign essential HTN   . Cognitive deficit, post-stroke   . Dysphagia, post-stroke   . Controlled type 2 diabetes mellitus with hyperglycemia, without long-term current use of insulin ([place])   . 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 Dyslipidemia, goal LDL below 70 03/12/2019  . Dysphagia due to recent stroke 03/12/2019  . Overweight 03/12/2019  . 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 Abnormality of gait   .  Gait disturbance, post-stroke   . 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    [name], [place], [name]  11/01/2020, 1:59 PM  [place]. [place], [place], [address] Phone: [phone]   Fax:  [phone]   Name: Joe CASIQUE Sr. MRN: [id number] Date of Birth: 1951/01/21

## 2020-11-04 ENCOUNTER — Encounter: Payer: Self-pay | Admitting: Speech Pathology

## 2020-11-04 ENCOUNTER — Ambulatory Visit: Payer: Medicare Other | Attending: Critical Care Medicine | Admitting: Speech Pathology

## 2020-11-04 ENCOUNTER — Other Ambulatory Visit: Payer: Self-pay

## 2020-11-04 DIAGNOSIS — R2681 Unsteadiness on feet: Secondary | ICD-10-CM | POA: Insufficient documentation

## 2020-11-04 DIAGNOSIS — R2689 Other abnormalities of gait and mobility: Secondary | ICD-10-CM | POA: Diagnosis not present

## 2020-11-04 DIAGNOSIS — R41841 Cognitive communication deficit: Secondary | ICD-10-CM | POA: Diagnosis present

## 2020-11-04 DIAGNOSIS — R293 Abnormal posture: Secondary | ICD-10-CM | POA: Diagnosis not present

## 2020-11-04 DIAGNOSIS — R260 Ataxic gait: Secondary | ICD-10-CM | POA: Diagnosis not present

## 2020-11-04 DIAGNOSIS — M6281 Muscle weakness (generalized): Secondary | ICD-10-CM | POA: Diagnosis not present

## 2020-11-04 DIAGNOSIS — I6329 Cerebral infarction due to unspecified occlusion or stenosis of other precerebral arteries: Secondary | ICD-10-CM | POA: Insufficient documentation

## 2020-11-04 DIAGNOSIS — R4701 Aphasia: Secondary | ICD-10-CM | POA: Diagnosis not present

## 2020-11-04 DIAGNOSIS — Z9181 History of falling: Secondary | ICD-10-CM | POA: Insufficient documentation

## 2020-11-04 NOTE — Therapy (Signed)
[place]. [place], [place], [address] Phone: [phone]   Fax:  [phone]  Speech Language Pathology Treatment  Patient Details  Name: Joe Perry Sr. MRN: [id number] Date of Birth: Mar 01, 1951 Referring Provider (SLP): Elsie Stain, MD   Encounter Date: 11/04/2020   End of Session - 11/04/20 1325    Visit Number 4    Number of Visits 25    Date for SLP Re-Evaluation 01/22/21    SLP Start Time 1315    SLP Stop Time  1400    SLP Time Calculation (min) 45 min    Activity Tolerance Patient tolerated treatment well           Past Medical History:  Diagnosis Date  . 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 Cerebrovascular accident (CVA) due to occlusion of vertebral artery ([place])   . Diabetes mellitus without complication ([place])   . Dysphagia    post stroke  . Hypertension   . Stroke [facility])     Past Surgical History:  Procedure Laterality Date  . IR ANGIO INTRA EXTRACRAN SEL COM CAROTID INNOMINATE UNI L MOD SED  03/10/2019  . IR ANGIO VERTEBRAL SEL SUBCLAVIAN INNOMINATE BILAT MOD SED  03/10/2019  . IR CT HEAD LTD  03/10/2019  . IR PERCUTANEOUS ART THROMBECTOMY/INFUSION INTRACRANIAL INC DIAG ANGIO  03/10/2019  . RADIOLOGY WITH ANESTHESIA N/A 03/10/2019   Procedure: IR WITH ANESTHESIA/CODE STROKE;  Surgeon: Radiologist, Medication, MD;  Location: [facility];  Service: Radiology;  Laterality: N/A;    There were no vitals filed for this visit.   Subjective Assessment - 11/04/20 1324    Subjective "I haven't been doing much."    Patient is accompained by: Family member    Currently in Pain? No/denies                 ADULT SLP TREATMENT - 11/04/20 0001      General Information   Behavior/Cognition Alert;Cooperative;Pleasant mood;Distractible;Decreased sustained attention      Treatment Provided   Treatment provided  Cognitive-Linquistic      Cognitive-Linquistic Treatment   Treatment focused on Cognition;Aphasia    Skilled Treatment Sustained attention for orientation. Spaced retrieval and written cues utilized.  Patient demonstrated difficulty choosing between foods (F02) ~40% correct. Sentence completion task was succcesful in eliciting more single words spntaneously. He benefited from a written cue.      Assessment / Recommendations / Plan   Plan Continue with current plan of care      Progression Toward Goals   Progression toward goals Progressing toward goals            SLP Education - 11/04/20 1324    Education Details Importance of working at home on cognitive skills.    Person(s) Educated Patient    Methods Explanation    Comprehension Verbalized understanding            SLP Short Term Goals - 11/04/20 1340      SLP SHORT TERM GOAL #1   Title Pt will make a choice between items in given list of options provided with <2 verbal/visual cues.    Time 5    Period Weeks    Status On-going      SLP SHORT TERM GOAL #2   Title Pt will swallow saliva prior to each communicative interaction given a <2 verbal/tactile cues.    Time 5    Period  Weeks    Status On-going      SLP SHORT TERM GOAL #3   Title Pt will demonstrate sustained attention to task given a personally relevant task for >5 minutes.    Baseline <5 min    Time 5    Period Weeks    Status On-going            SLP Long Term Goals - 11/04/20 1342      SLP LONG TERM GOAL #1   Title Client will utilize compensatory strategies to communicate wants and needs effectively to different conversational partners and participate socially in functional living environment.    Time 11    Period Weeks    Status On-going      SLP LONG TERM GOAL #2   Title Client will demonstrate understanding of verbal communication related to daily activities and utilize compensatory strategies to more effectively communicate in their  functional living environment.    Time 11    Period Weeks    Status On-going      SLP LONG TERM GOAL #3   Title Patient will develop functional attention skills to effectively attend to and communicate in tasks of daily living in their functional living environment.    Time 11    Period Weeks    Status On-going            Plan - 11/04/20 1327    Clinical Impression Statement Pt is a 70 yo male who presents with severe cognitive-communication impairment and dysphagia 2/2 to cognitive impairment. Pt wife accompanied pt and required encouragement to participate in answering questions regarding impairments seen at home. Pt required verbal and tactile cueing to swallow saliva before speaking. With cueing before each communicative interaction, pt was able to participate in today's evaluation verbally. Pt experienced significantly delayed initation in answering questions and following directions. Pt wife reported her primary concern is with his lack of communication of his needs/wants. She reports mostly mumbling at home with occasional greetings. SLUMS was conducted this date to determine extent of cognitive deficit. Patient required repetition, visual, and verbal cueing to complete. Patient received a 4/30 on today's assessment; however, pt demonstrated increased accuracy with verbal cueing and when provided with multiple choice cue. SLP rec skilled speech services to increase patient's ability to communicate needs/wants and further participation in ADLs to relieve caregiver burden.    Speech Therapy Frequency 2x / week    Duration 12 weeks    Treatment/Interventions Aspiration precaution training;Cueing hierarchy;Patient/family education;Functional tasks;Environmental controls;Cognitive reorganization;Multimodal communcation approach;Language facilitation;Compensatory techniques;Internal/external aids;SLP instruction and feedback;Compensatory strategies    Potential to Achieve Goals Fair     Potential Considerations Ability to learn/carryover information;Severity of impairments    Consulted and Agree with Plan of Care Patient;Family member/caregiver    Family Member Consulted Pt wife           Patient will benefit from skilled therapeutic intervention in order to improve the following deficits and impairments:   Cognitive communication deficit    Problem List Patient Active Problem List   Diagnosis Date Noted  . Benign essential HTN   . Cognitive deficit, post-stroke   . Dysphagia, post-stroke   . Controlled type 2 diabetes mellitus with hyperglycemia, without long-term current use of insulin ([place])   . 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 Dyslipidemia, goal LDL below 70 03/12/2019  . Dysphagia due to recent stroke 03/12/2019  . Overweight 03/12/2019  . Stroke (cerebrum) ([name]) - R PCA  s/p mechanical thrombectomy, d/t large vessel dz 03/10/2019  . 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 Abnormality of gait   . Gait disturbance, post-stroke   . 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    [name], [place], [name]  11/04/2020, 1:58 PM  [place]. [place], [place], [address] Phone: [phone]   Fax:  [phone]   Name: LONI ABDON Sr. MRN: [id number] Date of Birth: [birth year]

## 2020-11-04 NOTE — Patient Instructions (Signed)
Continue to name objects around the house.   Be patient and provide extra time for processing .

## 2020-11-08 ENCOUNTER — Ambulatory Visit: Payer: Medicare Other | Admitting: Speech Pathology

## 2020-11-08 ENCOUNTER — Encounter: Payer: Self-pay | Admitting: Speech Pathology

## 2020-11-08 ENCOUNTER — Other Ambulatory Visit: Payer: Self-pay

## 2020-11-08 DIAGNOSIS — R293 Abnormal posture: Secondary | ICD-10-CM | POA: Diagnosis not present

## 2020-11-08 DIAGNOSIS — Z9181 History of falling: Secondary | ICD-10-CM | POA: Diagnosis not present

## 2020-11-08 DIAGNOSIS — R260 Ataxic gait: Secondary | ICD-10-CM | POA: Diagnosis not present

## 2020-11-08 DIAGNOSIS — I6329 Cerebral infarction due to unspecified occlusion or stenosis of other precerebral arteries: Secondary | ICD-10-CM | POA: Diagnosis not present

## 2020-11-08 DIAGNOSIS — R4701 Aphasia: Secondary | ICD-10-CM

## 2020-11-08 DIAGNOSIS — M6281 Muscle weakness (generalized): Secondary | ICD-10-CM | POA: Diagnosis not present

## 2020-11-08 DIAGNOSIS — R2681 Unsteadiness on feet: Secondary | ICD-10-CM | POA: Diagnosis not present

## 2020-11-08 DIAGNOSIS — R2689 Other abnormalities of gait and mobility: Secondary | ICD-10-CM | POA: Diagnosis not present

## 2020-11-08 DIAGNOSIS — R41841 Cognitive communication deficit: Secondary | ICD-10-CM

## 2020-11-08 NOTE — Therapy (Signed)
[place]. [place], [place], [address] Phone: [phone]   Fax:  [phone]  Speech Language Pathology Treatment  Patient Details  Name: Joe Perry Sr. MRN: [id number] Date of Birth: [birth year] Referring Provider (SLP): Elsie Stain, MD   Encounter Date: 11/08/2020   End of Session - 11/08/20 1452    Visit Number 5    Number of Visits 25    Date for SLP Re-Evaluation 01/22/21    SLP Start Time 1445    SLP Stop Time  4174    SLP Time Calculation (min) 45 min    Activity Tolerance Patient tolerated treatment well           Past Medical History:  Diagnosis Date  . 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 Diabetes mellitus without complication ([place])   . Dysphagia    post stroke  . Hypertension   . Stroke [facility])     Past Surgical History:  Procedure Laterality Date  . IR ANGIO INTRA EXTRACRAN SEL COM CAROTID INNOMINATE UNI L MOD SED  03/10/2019  . IR ANGIO VERTEBRAL SEL SUBCLAVIAN INNOMINATE BILAT MOD SED  03/10/2019  . IR CT HEAD LTD  03/10/2019  . IR PERCUTANEOUS ART THROMBECTOMY/INFUSION INTRACRANIAL INC DIAG ANGIO  03/10/2019  . RADIOLOGY WITH ANESTHESIA N/A 03/10/2019   Procedure: IR WITH ANESTHESIA/CODE STROKE;  Surgeon: Radiologist, Medication, MD;  Location: [facility];  Service: Radiology;  Laterality: N/A;    There were no vitals filed for this visit.   Subjective Assessment - 11/08/20 1451    Subjective "Just hanging out."    Patient is accompained by: Family member    Currently in Pain? No/denies                 ADULT SLP TREATMENT - 11/08/20 1501      General Information   Behavior/Cognition Alert;Cooperative;Pleasant mood;Distractible;Decreased sustained attention      Treatment Provided   Treatment provided Cognitive-Linquistic       Cognitive-Linquistic Treatment   Treatment focused on Cognition;Aphasia    Skilled Treatment Addressed Level 1 naming on TalkPath - w/ definition cue, pt was able to achieve 50% indep and 70% when provided with a cloze sentence cue. Pt was provided with two pictures and a single word. Pt accurately identified object in 60% of tries given a verbal and visual prompt. Spaced retrieval for year - pt was able to verbalize "2000" but was unable to produce the full year - this is improvement from "19__". Utilized mirror to assist with self-awareness with swallowing saliva prior to speaking.      Assessment / Recommendations / Plan   Plan Continue with current plan of care      Progression Toward Goals   Progression toward goals Progressing toward goals            SLP Education - 11/08/20 1451    Education Details Importance of carrying over activities at home.    Person(s) Educated Patient    Methods Explanation    Comprehension Verbalized understanding            SLP Short Term Goals - 11/08/20 1452      SLP SHORT TERM GOAL #1   Title Pt will make a choice between items in given list of options provided with <2 verbal/visual cues.    Time  4    Period Weeks    Status On-going      SLP SHORT TERM GOAL #2   Title Pt will swallow saliva prior to each communicative interaction given a <2 verbal/tactile cues.    Time 4    Period Weeks    Status On-going      SLP SHORT TERM GOAL #3   Title Pt will demonstrate sustained attention to task given a personally relevant task for >5 minutes.    Baseline <5 min    Time 4    Period Weeks    Status On-going            SLP Long Term Goals - 11/08/20 1453      SLP LONG TERM GOAL #1   Title Client will utilize compensatory strategies to communicate wants and needs effectively to different conversational partners and participate socially in functional living environment.    Time 10    Period Weeks    Status On-going      SLP LONG TERM  GOAL #2   Title Client will demonstrate understanding of verbal communication related to daily activities and utilize compensatory strategies to more effectively communicate in their functional living environment.    Time 10    Period Weeks    Status On-going      SLP LONG TERM GOAL #3   Title Patient will develop functional attention skills to effectively attend to and communicate in tasks of daily living in their functional living environment.    Time 10    Period Weeks    Status On-going            Plan - 11/08/20 1452    Clinical Impression Statement Pt is a 70 yo male who presents with severe cognitive-communication impairment and dysphagia 2/2 to cognitive impairment. Pt wife accompanied pt and required encouragement to participate in answering questions regarding impairments seen at home. Pt required verbal and tactile cueing to swallow saliva before speaking. With cueing before each communicative interaction, pt was able to participate in today's evaluation verbally. Pt experienced significantly delayed initation in answering questions and following directions. Pt wife reported her primary concern is with his lack of communication of his needs/wants. She reports mostly mumbling at home with occasional greetings. SLUMS was conducted this date to determine extent of cognitive deficit. Patient required repetition, visual, and verbal cueing to complete. Patient received a 4/30 on today's assessment; however, pt demonstrated increased accuracy with verbal cueing and when provided with multiple choice cue. SLP rec skilled speech services to increase patient's ability to communicate needs/wants and further participation in ADLs to relieve caregiver burden.    Speech Therapy Frequency 2x / week    Duration 12 weeks    Treatment/Interventions Aspiration precaution training;Cueing hierarchy;Patient/family education;Functional tasks;Environmental controls;Cognitive reorganization;Multimodal  communcation approach;Language facilitation;Compensatory techniques;Internal/external aids;SLP instruction and feedback;Compensatory strategies    Potential to Achieve Goals Fair    Potential Considerations Ability to learn/carryover information;Severity of impairments    Consulted and Agree with Plan of Care Patient;Family member/caregiver    Family Member Consulted Pt wife           Patient will benefit from skilled therapeutic intervention in order to improve the following deficits and impairments:   Cognitive communication deficit  Aphasia    Problem List Patient Active Problem List   Diagnosis Date Noted  . Benign essential HTN   . Cognitive deficit, post-stroke   . Dysphagia, post-stroke   . Controlled type 2 diabetes mellitus  with hyperglycemia, without long-term current use of insulin ([place])   . 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 Dyslipidemia, goal LDL below 70 03/12/2019  . Dysphagia due to recent stroke 03/12/2019  . Overweight 03/12/2019  . 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 Abnormality of gait   . Gait disturbance, post-stroke   . 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    [name], [place], [name]  11/08/2020, 3:20 PM  [place]. [place], [place], [address] Phone: [phone]   Fax:  [phone]   Name: NIKKI RUSNAK Sr. MRN: [id number] Date of Birth: 29-Sep-1950

## 2020-11-08 NOTE — Patient Instructions (Signed)
Post the year on the wall at home to assist with orientation.

## 2020-11-11 ENCOUNTER — Encounter: Payer: Self-pay | Admitting: Speech Pathology

## 2020-11-11 ENCOUNTER — Other Ambulatory Visit: Payer: Self-pay

## 2020-11-11 ENCOUNTER — Ambulatory Visit: Payer: Medicare Other

## 2020-11-11 ENCOUNTER — Ambulatory Visit: Payer: Medicare Other | Admitting: Speech Pathology

## 2020-11-11 DIAGNOSIS — R4701 Aphasia: Secondary | ICD-10-CM | POA: Diagnosis not present

## 2020-11-11 DIAGNOSIS — Z9181 History of falling: Secondary | ICD-10-CM | POA: Diagnosis not present

## 2020-11-11 DIAGNOSIS — M6281 Muscle weakness (generalized): Secondary | ICD-10-CM

## 2020-11-11 DIAGNOSIS — R2681 Unsteadiness on feet: Secondary | ICD-10-CM | POA: Diagnosis not present

## 2020-11-11 DIAGNOSIS — R260 Ataxic gait: Secondary | ICD-10-CM | POA: Diagnosis not present

## 2020-11-11 DIAGNOSIS — R293 Abnormal posture: Secondary | ICD-10-CM

## 2020-11-11 DIAGNOSIS — R2689 Other abnormalities of gait and mobility: Secondary | ICD-10-CM

## 2020-11-11 DIAGNOSIS — R41841 Cognitive communication deficit: Secondary | ICD-10-CM | POA: Diagnosis not present

## 2020-11-11 DIAGNOSIS — I6329 Cerebral infarction due to unspecified occlusion or stenosis of other precerebral arteries: Secondary | ICD-10-CM | POA: Diagnosis not present

## 2020-11-11 NOTE — Therapy (Signed)
[place]. [place], [place], [address] Phone: [phone]   Fax:  [phone]  Speech Language Pathology Treatment  Patient Details  Name: Joe NETTLETON Sr. MRN: [id number] Date of Birth: 04/20/1951 Referring Provider (SLP): Elsie Stain, MD   Encounter Date: 11/11/2020   End of Session - 11/11/20 1408    Visit Number 6    Number of Visits 25    Date for SLP Re-Evaluation 01/22/21    SLP Start Time 1320   pt late   SLP Stop Time  1400    SLP Time Calculation (min) 40 min    Activity Tolerance Patient tolerated treatment well           Past Medical History:  Diagnosis Date  . 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 Diabetes mellitus without complication ([place])   . Dysphagia    post stroke  . Hypertension   . Stroke [facility])     Past Surgical History:  Procedure Laterality Date  . IR ANGIO INTRA EXTRACRAN SEL COM CAROTID INNOMINATE UNI L MOD SED  03/10/2019  . IR ANGIO VERTEBRAL SEL SUBCLAVIAN INNOMINATE BILAT MOD SED  03/10/2019  . IR CT HEAD LTD  03/10/2019  . IR PERCUTANEOUS ART THROMBECTOMY/INFUSION INTRACRANIAL INC DIAG ANGIO  03/10/2019  . RADIOLOGY WITH ANESTHESIA N/A 03/10/2019   Procedure: IR WITH ANESTHESIA/CODE STROKE;  Surgeon: Radiologist, Medication, MD;  Location: [facility];  Service: Radiology;  Laterality: N/A;    There were no vitals filed for this visit.   Subjective Assessment - 11/11/20 1327    Subjective Pt was pleasant and cooperative throughout today's treatment. Nothing new to report.    Currently in Pain? No/denies                 ADULT SLP TREATMENT - 11/11/20 1327      General Information   Behavior/Cognition Alert;Cooperative;Pleasant mood;Distractible;Decreased sustained attention      Treatment Provided   Treatment  provided Cognitive-Linquistic      Cognitive-Linquistic Treatment   Treatment focused on Cognition;Aphasia    Skilled Treatment Pt was oriented to year on the first try today. He wrote it on the white board correctly and then said 2023 instead. He was able to choose month correctly given a field of 2. Pt required max cueing when orienting to calendar. Most likely due to linguistic deficit. Was able to choose food vs furniture between a field of two, but required constant cues for visual scanning. If pt did not look at both images, he just took the one in his sight regardless of what therapist said.      Assessment / Recommendations / Plan   Plan Continue with current plan of care            SLP Education - 11/11/20 1411    Education Details Provided edu to caregiver about providing choices instead of "testing" his memory.    Person(s) Educated Patient;Spouse    Methods Explanation;Demonstration;Verbal cues    Comprehension Verbalized understanding;Returned demonstration            SLP Short Term Goals - 11/11/20 1410      SLP SHORT TERM GOAL #1   Title Pt will make a choice between items in given list of options provided with <2 verbal/visual cues.    Time 4  Period Weeks    Status On-going      SLP SHORT TERM GOAL #2   Title Pt will swallow saliva prior to each communicative interaction given a <2 verbal/tactile cues.    Time 4    Period Weeks    Status On-going      SLP SHORT TERM GOAL #3   Title Pt will demonstrate sustained attention to task given a personally relevant task for >5 minutes.    Baseline <5 min    Time 4    Period Weeks    Status On-going            SLP Long Term Goals - 11/11/20 1410      SLP LONG TERM GOAL #1   Title Client will utilize compensatory strategies to communicate wants and needs effectively to different conversational partners and participate socially in functional living environment.    Time 10    Period Weeks    Status On-going       SLP LONG TERM GOAL #2   Title Client will demonstrate understanding of verbal communication related to daily activities and utilize compensatory strategies to more effectively communicate in their functional living environment.    Time 10    Period Weeks    Status On-going      SLP LONG TERM GOAL #3   Title Patient will develop functional attention skills to effectively attend to and communicate in tasks of daily living in their functional living environment.    Time 10    Period Weeks    Status On-going            Plan - 11/11/20 1409    Clinical Impression Statement Pt is a 70 yo male who presents with severe cognitive-communication impairment and dysphagia 2/2 to cognitive impairment. Pt wife accompanied pt and required encouragement to participate in answering questions regarding impairments seen at home. Pt required verbal and tactile cueing to swallow saliva before speaking. With cueing before each communicative interaction, pt was able to participate in today's evaluation verbally. Pt experienced significantly delayed initation in answering questions and following directions. Pt wife reported her primary concern is with his lack of communication of his needs/wants. She reports mostly mumbling at home with occasional greetings. SLUMS was conducted this date to determine extent of cognitive deficit. Patient required repetition, visual, and verbal cueing to complete. Patient received a 4/30 on today's assessment; however, pt demonstrated increased accuracy with verbal cueing and when provided with multiple choice cue. SLP rec skilled speech services to increase patient's ability to communicate needs/wants and further participation in ADLs to relieve caregiver burden.    Speech Therapy Frequency 2x / week    Duration 12 weeks    Treatment/Interventions Aspiration precaution training;Cueing hierarchy;Patient/family education;Functional tasks;Environmental controls;Cognitive  reorganization;Multimodal communcation approach;Language facilitation;Compensatory techniques;Internal/external aids;SLP instruction and feedback;Compensatory strategies    Potential to Achieve Goals Fair    Potential Considerations Ability to learn/carryover information;Severity of impairments    Consulted and Agree with Plan of Care Patient;Family member/caregiver    Family Member Consulted Pt wife           Patient will benefit from skilled therapeutic intervention in order to improve the following deficits and impairments:   Cognitive communication deficit    Problem List Patient Active Problem List   Diagnosis Date Noted  . Benign essential HTN   . Cognitive deficit, post-stroke   . Dysphagia, post-stroke   . Controlled type 2 diabetes mellitus with hyperglycemia, without long-term current use  of insulin ([place])   . 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 Dyslipidemia, goal LDL below 70 03/12/2019  . Dysphagia due to recent stroke 03/12/2019  . Overweight 03/12/2019  . 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 Abnormality of gait   . Gait disturbance, post-stroke   . 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    [name], [place], [name]  11/11/2020, 2:12 PM  [place]. [place], [place], [address] Phone: [phone]   Fax:  [phone]   Name: Joe ANTOSH Sr. MRN: [id number] Date of Birth: 17-Dec-1950

## 2020-11-12 NOTE — Therapy (Signed)
[facility] [place], [place], [address] Phone: [phone]   Fax:  [phone]  Physical Therapy Evaluation  Patient Details  Name: Joe Perry Sr. MRN: [id number] Date of Birth: [birth year] Referring Provider (PT): Asencion Noble, MD   Encounter Date: 11/11/2020   PT End of Session - 11/11/20 1659    Visit Number 1    Number of Visits 17    Date for PT Re-Evaluation 01/06/21    Authorization Type [employer] Medicare    PT Start Time 1400    PT Stop Time 9476    PT Time Calculation (min) 45 min    Equipment Utilized During Treatment Gait belt    Activity Tolerance Patient tolerated treatment well;Patient limited by fatigue    Behavior During Therapy Flat affect           Past Medical History:  Diagnosis Date  . 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 ([place]) 06/03/2019  . Cerebrovascular accident (CVA) due to occlusion of vertebral artery ([place])   . Diabetes mellitus without complication ([place])   . Dysphagia    post stroke  . Hypertension   . Stroke [facility])     Past Surgical History:  Procedure Laterality Date  . IR ANGIO INTRA EXTRACRAN SEL COM CAROTID INNOMINATE UNI L MOD SED  03/10/2019  . IR ANGIO VERTEBRAL SEL SUBCLAVIAN INNOMINATE BILAT MOD SED  03/10/2019  . IR CT HEAD LTD  03/10/2019  . IR PERCUTANEOUS ART THROMBECTOMY/INFUSION INTRACRANIAL INC DIAG ANGIO  03/10/2019  . RADIOLOGY WITH ANESTHESIA N/A 03/10/2019   Procedure: IR WITH ANESTHESIA/CODE STROKE;  Surgeon: Radiologist, Medication, MD;  Location: [facility];  Service: Radiology;  Laterality: N/A;    There were no vitals filed for this visit.    Subjective Assessment - 11/11/20 1404    Subjective CVA first one around 2012, 2nd one 2019. Left side a bit weaker per Joe report    Patient is accompained by: Family member   Joe, Perry   Limitations Walking    Patient Stated Goals Pt's goals for therapy are to  walk better. (pt agrees with Joe's statement)    Currently in Pain? No/denies              [facility] PT Assessment - 11/11/20 1407      Assessment   Medical Diagnosis Cerebrovascular accident (CVA) due to thrombosis of precerebral artery,  Ataxia,  Abnormality of gait, Gait disturbance, post-stroke    Referring Provider (PT) Asencion Noble, MD    Hand Dominance Right      Balance Screen   Has the patient fallen in the past 6 months Yes    How many times? 1 - pushing the walker forward    Has the patient had a decrease in activity level because of a fear of falling?  Yes    Is the patient reluctant to leave their home because of a fear of falling?  Yes      [place] Private residence    Living Arrangements Spouse/significant other    Available Help at Discharge Family    Type of Painter to enter    Entrance Stairs-Number of Steps --   4 in the front and 14 in the back. BHR in the front.  Back steps one sided but 2 HR once halfway down     Prior Function   Vocation Retired  Cognition   Overall Cognitive Status Impaired/Different from baseline      Observation/Other Assessments   Observations In sitting without back support begins to posteriorly lean trunk, unaware. Joe reports he will start to slip out of chair      Sensation   Light Touch Impaired Detail   dificult to assess 2/2 cognition/communicaiton     Posture/Postural Control   Posture/Postural Control Postural limitations    Postural Limitations Forward head;Rounded Shoulders;Increased thoracic kyphosis;Flexed trunk    Posture Comments Diminished postural control, begins slowly  to fall back in sitting without back support  - in arm chair with back support will slide pelvis forward a bit      Strength   Overall Strength Comments Difficulty following command for MMT BLE. BLE grossly 3/5      Transfers   Transfers Sit to Stand;Stand to Sit    Sit to Stand 4:  Min assist;With upper extremity assist;With armrests    Sit to Stand Details Tactile cues for sequencing;Tactile cues for placement;Verbal cues for technique;Verbal cues for sequencing    Stand to Sit 4: Min assist;With upper extremity assist;With armrests    Stand to Sit Details (indicate cue type and reason) Tactile cues for sequencing;Tactile cues for placement;Verbal cues for technique;Verbal cues for sequencing      Ambulation/Gait   Ambulation/Gait Yes    Ambulation/Gait Assistance 4: Min guard;4: Min assist    Ambulation/Gait Assistance Details FWW - requires short verbal and tactile cues for directions.    Gait Pattern Step-through pattern;Decreased step length - right;Decreased step length - left;Poor foot clearance - left;Poor foot clearance - right;Ataxic    Ambulation Surface Level;Indoor    Gait velocity slow      Standardized Balance Assessment   Standardized Balance Assessment Timed Up and Go Test;Five Times Sit to Stand    Five times sit to stand comments  54 seconds with arm chair support and BUE on walker      Timed Up and Go Test   Normal TUG (seconds) 59   with FWW   TUG Comments Scores >13.5 seconds indicates increased fall risk; >30 seconds indicates increased difficultyw ith ADLs in home.             Objective measurements completed on examination: See above findings.        PT Education - 11/11/20 1444    Education Details PT POC, Initial HEP. Access Code: [email or web address]    Exercises  Seated Long Arc Quad - 1 x daily - 7 x weekly - 2 sets - 10 reps  Seated March - 1 x daily - 7 x weekly - 2 sets - 10 reps  Overhead press holding pillow or ball 10 x 2 in sitting    Person(s) Educated Patient;Spouse   Joe Perry   Methods Explanation;Demonstration;Handout    Comprehension Verbalized understanding;Returned demonstration;Need further instruction            PT Short Term Goals - 11/11/20 1445      PT SHORT TERM GOAL #1   Title Pt will perform HEP with  Joe's assistance, for improved strength, balance, gait.    Time 4    Period Weeks    Status New    Target Date 12/09/20             PT Long Term Goals - 11/11/20 1446      PT LONG TERM GOAL #1   Title Pt will perform progressive HEP with family supervision  and assistance for improved balance, strength, gait.    Time 8    Period Weeks    Status New    Target Date 01/06/21      PT LONG TERM GOAL #2   Title Pt will improve 5TSTS score to </= 20 seconds to demonstrate improved functional strength and decreased fall risk    Time 8    Period Weeks    Status New    Target Date 01/06/21      PT LONG TERM GOAL #3   Title Pt will improve TUG score to less than or equal to 25 seconds for decreased fall risk.    Time 8    Period Weeks    Status New    Target Date 01/06/21                  Plan - 11/11/20 1700    Clinical Impression Statement Pt is a 70 yo male who presents to OPPT with history of CVA in 2019, L sided weakness, dysphagia, difficulty ambulating, hx of falls. Joe reports 1 fall in the past 6 months, occuring when walking with walker as he frequently pushes it out in front of him. He currently presents with some cognitive and communication deficits for which he is attanding speech therapy.  Pt presents with decreased strength in lower extremities and in trunk, decreased balance and postural control, decreased safety awareness, decreased gait and transfer safety. Pt demonstrates fall risk per TUG and 5TSTS scores. Prior to pt's CVA, Joe reports pt was independent and teaching as a professor at [employer]. Yahye will benefit from skilled PT to address the above stated deficits to decrease fall risk and improve functional mobility.    Personal Factors and Comorbidities Comorbidity 3+    Comorbidities DM, HTN, CVA, dsyphagia    Examination-Activity Limitations Locomotion Level;Transfers;Stand;Stairs    Examination-Participation Restrictions Community  Activity;Other    Stability/Clinical Decision Making Evolving/Moderate complexity    Clinical Decision Making Moderate    Rehab Potential Fair   cognitive impairments, decreased safety awareness, severity of deficits   PT Frequency 2x / week    PT Duration 8 weeks    PT Treatment/Interventions ADLs/Self Care Home Management;DME Instruction;Neuromuscular re-education;Balance training;Therapeutic exercise;Therapeutic activities;Functional mobility training;Stair training;Gait training;Patient/family education;Energy conservation;Manual techniques    PT Next Visit Plan Review HEP for lower extremity and trunk strengthening; transfer training, gait training; Joe education for safety with transfers and gait    Consulted and Agree with Plan of Care Patient;Family member/caregiver    Family Member Consulted Joe           Patient will benefit from skilled therapeutic intervention in order to improve the following deficits and impairments:  Abnormal gait,Decreased coordination,Difficulty walking,Decreased safety awareness,Decreased balance,Decreased strength,Decreased mobility  Visit Diagnosis: Muscle weakness (generalized) - Plan: PT plan of care cert/re-cert  Ataxic gait - Plan: PT plan of care cert/re-cert  Abnormal posture - Plan: PT plan of care cert/re-cert  Other abnormalities of gait and mobility - Plan: PT plan of care cert/re-cert  History of falling - Plan: PT plan of care cert/re-cert  Unsteadiness on feet - Plan: PT plan of care cert/re-cert  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 ([place]) - Plan: PT plan of care cert/re-cert     Problem List Patient Active Problem List   Diagnosis Date Noted  . Benign essential HTN   . Cognitive deficit, post-stroke   . Dysphagia, post-stroke   . Controlled type 2  diabetes mellitus with hyperglycemia, without long-term current use of insulin ([place])   . Vestibular schwannoma ([place]) 06/03/2019   . 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 Dyslipidemia, goal LDL below 70 03/12/2019  . Dysphagia due to recent stroke 03/12/2019  . Overweight 03/12/2019  . 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 Abnormality of gait   . Gait disturbance, post-stroke   . 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    Hall Busing, PT, DPT 11/12/2020, 9:28 AM  [facility]. [place], [place], [address] Phone: [phone]   Fax:  [phone]  Name: NAVEN GIAMBALVO Sr. MRN: [id number] Date of Birth: 30-Mar-1951

## 2020-11-15 ENCOUNTER — Encounter: Payer: Self-pay | Admitting: Speech Pathology

## 2020-11-15 ENCOUNTER — Other Ambulatory Visit: Payer: Self-pay

## 2020-11-15 ENCOUNTER — Ambulatory Visit: Payer: Medicare Other | Admitting: Speech Pathology

## 2020-11-15 DIAGNOSIS — R2681 Unsteadiness on feet: Secondary | ICD-10-CM | POA: Diagnosis not present

## 2020-11-15 DIAGNOSIS — R4701 Aphasia: Secondary | ICD-10-CM | POA: Diagnosis not present

## 2020-11-15 DIAGNOSIS — Z9181 History of falling: Secondary | ICD-10-CM | POA: Diagnosis not present

## 2020-11-15 DIAGNOSIS — R260 Ataxic gait: Secondary | ICD-10-CM | POA: Diagnosis not present

## 2020-11-15 DIAGNOSIS — R293 Abnormal posture: Secondary | ICD-10-CM | POA: Diagnosis not present

## 2020-11-15 DIAGNOSIS — R41841 Cognitive communication deficit: Secondary | ICD-10-CM | POA: Diagnosis not present

## 2020-11-15 DIAGNOSIS — I6329 Cerebral infarction due to unspecified occlusion or stenosis of other precerebral arteries: Secondary | ICD-10-CM | POA: Diagnosis not present

## 2020-11-15 DIAGNOSIS — M6281 Muscle weakness (generalized): Secondary | ICD-10-CM | POA: Diagnosis not present

## 2020-11-15 DIAGNOSIS — R2689 Other abnormalities of gait and mobility: Secondary | ICD-10-CM | POA: Diagnosis not present

## 2020-11-15 NOTE — Therapy (Signed)
[place]. [place], [place], [address] Phone: [phone]   Fax:  [phone]  Speech Language Pathology Treatment  Patient Details  Name: Joe DENIO Sr. MRN: [id number] Date of Birth: [birth year] Referring Provider (SLP): Elsie Stain, MD   Encounter Date: 11/15/2020   End of Session - 11/15/20 1337    Visit Number 7    Number of Visits 25    Date for SLP Re-Evaluation 01/22/21    SLP Start Time 1335    SLP Stop Time  1400    SLP Time Calculation (min) 25 min    Activity Tolerance Patient tolerated treatment well           Past Medical History:  Diagnosis Date  . 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 ([place]) 06/03/2019  . Cerebrovascular accident (CVA) due to occlusion of vertebral artery ([place])   . Diabetes mellitus without complication ([place])   . Dysphagia    post stroke  . Hypertension   . Stroke [facility])     Past Surgical History:  Procedure Laterality Date  . IR ANGIO INTRA EXTRACRAN SEL COM CAROTID INNOMINATE UNI L MOD SED  03/10/2019  . IR ANGIO VERTEBRAL SEL SUBCLAVIAN INNOMINATE BILAT MOD SED  03/10/2019  . IR CT HEAD LTD  03/10/2019  . IR PERCUTANEOUS ART THROMBECTOMY/INFUSION INTRACRANIAL INC DIAG ANGIO  03/10/2019  . RADIOLOGY WITH ANESTHESIA N/A 03/10/2019   Procedure: IR WITH ANESTHESIA/CODE STROKE;  Surgeon: Radiologist, Medication, MD;  Location: [facility];  Service: Radiology;  Laterality: N/A;    There were no vitals filed for this visit.   Subjective Assessment - 11/15/20 1334    Subjective Pt late 20 min today. To see patient for 25 min.    Patient is accompained by: Family member    Currently in Pain? No/denies                 ADULT SLP TREATMENT - 11/15/20 1351      General Information   Behavior/Cognition Alert;Cooperative;Pleasant mood;Distractible;Decreased sustained attention      Treatment Provided   Treatment  provided Cognitive-Linquistic      Cognitive-Linquistic Treatment   Treatment focused on Cognition;Aphasia    Skilled Treatment Focused attention on matching cards by color, shape, and number. Short session today due to lateness. Pt benefited from phonetic cueing. Cognitive fatigue noted in today's session.      Assessment / Recommendations / Plan   Plan Continue with current plan of care      Progression Toward Goals   Progression toward goals Progressing toward goals              SLP Short Term Goals - 11/15/20 1338      SLP SHORT TERM GOAL #1   Title Pt will make a choice between items in given list of options provided with <2 verbal/visual cues.    Time 3    Period Weeks    Status On-going      SLP SHORT TERM GOAL #2   Title Pt will swallow saliva prior to each communicative interaction given a <2 verbal/tactile cues.    Time 3    Period Weeks    Status On-going      SLP SHORT TERM GOAL #3   Title Pt will demonstrate sustained attention to task given a personally relevant task for >5 minutes.    Baseline <5 min    Time 3  Period Weeks    Status On-going            SLP Long Term Goals - 11/15/20 1338      SLP LONG TERM GOAL #1   Title Client will utilize compensatory strategies to communicate wants and needs effectively to different conversational partners and participate socially in functional living environment.    Time 9    Period Weeks    Status On-going      SLP LONG TERM GOAL #2   Title Client will demonstrate understanding of verbal communication related to daily activities and utilize compensatory strategies to more effectively communicate in their functional living environment.    Time 9    Period Weeks    Status On-going      SLP LONG TERM GOAL #3   Title Patient will develop functional attention skills to effectively attend to and communicate in tasks of daily living in their functional living environment.    Time 9    Period Weeks     Status On-going            Plan - 11/15/20 1338    Clinical Impression Statement Pt is a 70 yo male who presents with severe cognitive-communication impairment and dysphagia 2/2 to cognitive impairment. Pt wife accompanied pt and required encouragement to participate in answering questions regarding impairments seen at home. Pt required verbal and tactile cueing to swallow saliva before speaking. With cueing before each communicative interaction, pt was able to participate in today's evaluation verbally. Pt experienced significantly delayed initation in answering questions and following directions. Pt wife reported her primary concern is with his lack of communication of his needs/wants. She reports mostly mumbling at home with occasional greetings. SLUMS was conducted this date to determine extent of cognitive deficit. Patient required repetition, visual, and verbal cueing to complete. Patient received a 4/30 on today's assessment; however, pt demonstrated increased accuracy with verbal cueing and when provided with multiple choice cue. SLP rec skilled speech services to increase patient's ability to communicate needs/wants and further participation in ADLs to relieve caregiver burden.    Speech Therapy Frequency 2x / week    Duration 12 weeks    Treatment/Interventions Aspiration precaution training;Cueing hierarchy;Patient/family education;Functional tasks;Environmental controls;Cognitive reorganization;Multimodal communcation approach;Language facilitation;Compensatory techniques;Internal/external aids;SLP instruction and feedback;Compensatory strategies    Potential to Achieve Goals Fair    Potential Considerations Ability to learn/carryover information;Severity of impairments    Consulted and Agree with Plan of Care Patient;Family member/caregiver    Family Member Consulted Pt wife           Patient will benefit from skilled therapeutic intervention in order to improve the following  deficits and impairments:   Cognitive communication deficit    Problem List Patient Active Problem List   Diagnosis Date Noted  . Benign essential HTN   . Cognitive deficit, post-stroke   . Dysphagia, post-stroke   . Controlled type 2 diabetes mellitus with hyperglycemia, without long-term current use of insulin ([place])   . 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 Dyslipidemia, goal LDL below 70 03/12/2019  . Dysphagia due to recent stroke 03/12/2019  . Overweight 03/12/2019  . 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 Abnormality of gait   . Gait disturbance, post-stroke   . 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    [name], CCC-SLP, [name]  11/15/2020, 2:03 PM  [facility]  [place]. [place], [place], [address] Phone: [phone]   Fax:  [phone]   Name: Joe RINCK Sr. MRN: [id number] Date of Birth: May 05, 1951

## 2020-11-18 ENCOUNTER — Other Ambulatory Visit: Payer: Self-pay

## 2020-11-18 ENCOUNTER — Ambulatory Visit: Payer: Medicare Other

## 2020-11-18 ENCOUNTER — Encounter: Payer: Self-pay | Admitting: Speech Pathology

## 2020-11-18 ENCOUNTER — Ambulatory Visit: Payer: Medicare Other | Admitting: Speech Pathology

## 2020-11-18 DIAGNOSIS — R293 Abnormal posture: Secondary | ICD-10-CM | POA: Diagnosis not present

## 2020-11-18 DIAGNOSIS — R2681 Unsteadiness on feet: Secondary | ICD-10-CM | POA: Diagnosis not present

## 2020-11-18 DIAGNOSIS — Z9181 History of falling: Secondary | ICD-10-CM | POA: Diagnosis not present

## 2020-11-18 DIAGNOSIS — R2689 Other abnormalities of gait and mobility: Secondary | ICD-10-CM | POA: Diagnosis not present

## 2020-11-18 DIAGNOSIS — M6281 Muscle weakness (generalized): Secondary | ICD-10-CM

## 2020-11-18 DIAGNOSIS — R41841 Cognitive communication deficit: Secondary | ICD-10-CM

## 2020-11-18 DIAGNOSIS — R4701 Aphasia: Secondary | ICD-10-CM | POA: Diagnosis not present

## 2020-11-18 DIAGNOSIS — R260 Ataxic gait: Secondary | ICD-10-CM

## 2020-11-18 DIAGNOSIS — I6329 Cerebral infarction due to unspecified occlusion or stenosis of other precerebral arteries: Secondary | ICD-10-CM | POA: Diagnosis not present

## 2020-11-18 NOTE — Therapy (Signed)
[place]. [place], [place], [address] Phone: [phone]   Fax:  [phone]  Speech Language Pathology Treatment  Patient Details  Name: Joe FLUCKIGER Sr. MRN: [id number] Date of Birth: 09/01/1951 Referring Provider (SLP): Elsie Stain, MD   Encounter Date: 11/18/2020   End of Session - 11/18/20 1408    Visit Number 8    Number of Visits 25    Date for SLP Re-Evaluation 01/22/21    SLP Start Time 89    SLP Stop Time  1445    SLP Time Calculation (min) 45 min    Activity Tolerance Patient tolerated treatment well           Past Medical History:  Diagnosis Date  . 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 ([place]) 06/03/2019  . Cerebrovascular accident (CVA) due to occlusion of vertebral artery ([place])   . Diabetes mellitus without complication ([place])   . Dysphagia    post stroke  . Hypertension   . Stroke [facility])     Past Surgical History:  Procedure Laterality Date  . IR ANGIO INTRA EXTRACRAN SEL COM CAROTID INNOMINATE UNI L MOD SED  03/10/2019  . IR ANGIO VERTEBRAL SEL SUBCLAVIAN INNOMINATE BILAT MOD SED  03/10/2019  . IR CT HEAD LTD  03/10/2019  . IR PERCUTANEOUS ART THROMBECTOMY/INFUSION INTRACRANIAL INC DIAG ANGIO  03/10/2019  . RADIOLOGY WITH ANESTHESIA N/A 03/10/2019   Procedure: IR WITH ANESTHESIA/CODE STROKE;  Surgeon: Radiologist, Medication, MD;  Location: [facility];  Service: Radiology;  Laterality: N/A;    There were no vitals filed for this visit.   Subjective Assessment - 11/18/20 1407    Subjective Pt reports he is feeling well today.    Currently in Pain? No/denies                 ADULT SLP TREATMENT - 11/18/20 1411      General Information   Behavior/Cognition Alert;Cooperative;Pleasant mood;Distractible;Decreased sustained attention      Treatment Provided   Treatment provided Cognitive-Linquistic      Cognitive-Linquistic  Treatment   Treatment focused on Cognition;Aphasia    Skilled Treatment Addressed visual scanning, attention, and organization by matching items to category. Pt was able to navigate mouse and computer with minA (he did technology for work) and only required [employer] for categories. Seems to work on computer better than tangible pictures/objects. Attempted matching basic words to objects, required maxA.      Assessment / Recommendations / Plan   Plan Continue with current plan of care      Progression Toward Goals   Progression toward goals Progressing toward goals              SLP Short Term Goals - 11/18/20 1408      SLP SHORT TERM GOAL #1   Title Pt will make a choice between items in given list of options provided with <2 verbal/visual cues.    Time 3    Period Weeks    Status On-going      SLP SHORT TERM GOAL #2   Title Pt will swallow saliva prior to each communicative interaction given a <2 verbal/tactile cues.    Time 3    Period Weeks    Status On-going      SLP SHORT TERM GOAL #3   Title Pt will demonstrate sustained attention to task given a personally relevant task for >5 minutes.    Baseline <5  min    Time 3    Period Weeks    Status On-going            SLP Long Term Goals - 11/18/20 1408      SLP LONG TERM GOAL #1   Title Client will utilize compensatory strategies to communicate wants and needs effectively to different conversational partners and participate socially in functional living environment.    Time 9    Period Weeks    Status On-going      SLP LONG TERM GOAL #2   Title Client will demonstrate understanding of verbal communication related to daily activities and utilize compensatory strategies to more effectively communicate in their functional living environment.    Time 9    Period Weeks    Status On-going      SLP LONG TERM GOAL #3   Title Patient will develop functional attention skills to effectively attend to and communicate in tasks of  daily living in their functional living environment.    Time 9    Period Weeks    Status On-going            Plan - 11/18/20 1408    Clinical Impression Statement Pt is a 70 yo male who presents with severe cognitive-communication impairment and dysphagia 2/2 to cognitive impairment. Pt wife accompanied pt and required encouragement to participate in answering questions regarding impairments seen at home. Pt required verbal and tactile cueing to swallow saliva before speaking. With cueing before each communicative interaction, pt was able to participate in today's evaluation verbally. Pt experienced significantly delayed initation in answering questions and following directions. Pt wife reported her primary concern is with his lack of communication of his needs/wants. She reports mostly mumbling at home with occasional greetings. SLUMS was conducted this date to determine extent of cognitive deficit. Patient required repetition, visual, and verbal cueing to complete. Patient received a 4/30 on today's assessment; however, pt demonstrated increased accuracy with verbal cueing and when provided with multiple choice cue. SLP rec skilled speech services to increase patient's ability to communicate needs/wants and further participation in ADLs to relieve caregiver burden.    Speech Therapy Frequency 2x / week    Duration 12 weeks    Treatment/Interventions Aspiration precaution training;Cueing hierarchy;Patient/family education;Functional tasks;Environmental controls;Cognitive reorganization;Multimodal communcation approach;Language facilitation;Compensatory techniques;Internal/external aids;SLP instruction and feedback;Compensatory strategies    Potential to Achieve Goals Fair    Potential Considerations Ability to learn/carryover information;Severity of impairments    Consulted and Agree with Plan of Care Patient;Family member/caregiver    Family Member Consulted Pt wife           Patient will  benefit from skilled therapeutic intervention in order to improve the following deficits and impairments:   Cognitive communication deficit  Aphasia    Problem List Patient Active Problem List   Diagnosis Date Noted  . Benign essential HTN   . Cognitive deficit, post-stroke   . Dysphagia, post-stroke   . Controlled type 2 diabetes mellitus with hyperglycemia, without long-term current use of insulin ([place])   . 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 Dyslipidemia, goal LDL below 70 03/12/2019  . Dysphagia due to recent stroke 03/12/2019  . Overweight 03/12/2019  . 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 Abnormality of gait   . Gait disturbance, post-stroke   . 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    Dominica  Konrad Saha MS, CCC-SLP, [name]  11/18/2020, 2:41 PM  [place]. [place], [place], [address] Phone: [phone]   Fax:  [phone]   Name: THAER MIYOSHI Sr. MRN: [id number] Date of Birth: 1951/07/15

## 2020-11-18 NOTE — Patient Instructions (Signed)
Continue to work on naming things throughout the house. Provide Joe Perry with choices ( _____ or _____) to support his communication of needs and wants.

## 2020-11-18 NOTE — Therapy (Signed)
[place]. [place], [place], [address] Phone: [phone]   Fax:  [phone]  Physical Therapy Treatment  Patient Details  Name: Joe COWDREY Sr. MRN: [id number] Date of Birth: April 10, 1951 Referring Provider (PT): Asencion Noble, MD   Encounter Date: 11/18/2020   PT End of Session - 11/18/20 1404    Visit Number 2    Number of Visits 17    Date for PT Re-Evaluation 01/06/21    Authorization Type U[employer] Medicare    PT Start Time 0737    PT Stop Time 1400    PT Time Calculation (min) 43 min    Equipment Utilized During Treatment Gait belt    Activity Tolerance Patient tolerated treatment well;Patient limited by fatigue    Behavior During Therapy Flat affect           Past Medical History:  Diagnosis Date  . 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 ([place]) 06/03/2019  . Cerebrovascular accident (CVA) due to occlusion of vertebral artery ([place])   . Diabetes mellitus without complication ([place])   . Dysphagia    post stroke  . Hypertension   . Stroke [facility])     Past Surgical History:  Procedure Laterality Date  . IR ANGIO INTRA EXTRACRAN SEL COM CAROTID INNOMINATE UNI L MOD SED  03/10/2019  . IR ANGIO VERTEBRAL SEL SUBCLAVIAN INNOMINATE BILAT MOD SED  03/10/2019  . IR CT HEAD LTD  03/10/2019  . IR PERCUTANEOUS ART THROMBECTOMY/INFUSION INTRACRANIAL INC DIAG ANGIO  03/10/2019  . RADIOLOGY WITH ANESTHESIA N/A 03/10/2019   Procedure: IR WITH ANESTHESIA/CODE STROKE;  Surgeon: Radiologist, Medication, MD;  Location: [place];  Service: Radiology;  Laterality: N/A;    There were no vitals filed for this visit.   Subjective Assessment - 11/18/20 1322    Subjective Pt's wife reports he does not do exercise at home, as he does not listen to her at home. He walks from living room to back room. He does all of his exercises when he comes.    Patient is accompained by: Family member    Wife, [name]   Limitations Walking    Patient Stated Goals Pt's goals for therapy are to walk better. (pt agrees with wife's statement)    Currently in Pain? No/denies                             [facility] Adult PT Treatment/Exercise - 11/18/20 0001      Therapeutic Activites    Therapeutic Activities Other Therapeutic Activities    Other Therapeutic Activities CGA/MIN A provided to ambulate 100 ft with RW, max verbal cues for upward gaze and upright posture, RW proximity (too close at times, increased step lengths and heights; seated on firm airex front to increased neutral pelvis due to incr posterior pelvic tilt to perform seated reaches multi-directional for rotation and extension, sit <> stand blocked practice x 10 for safe and appropriate hand placement and mechanics using RW, standing at wall in RW to reach towel up wall with assist x 10 for improved standing extension and upward gaze      Exercises   Exercises Knee/Hip      Knee/Hip Exercises: Seated   Long Arc Quad Both;2 sets;10 reps    Marching Both;2 sets;10 reps                    PT  Short Term Goals - 11/11/20 1445      PT SHORT TERM GOAL #1   Title Pt will perform HEP with wife's assistance, for improved strength, balance, gait.    Time 4    Period Weeks    Status New    Target Date 12/09/20             PT Long Term Goals - 11/11/20 1446      PT LONG TERM GOAL #1   Title Pt will perform progressive HEP with family supervision and assistance for improved balance, strength, gait.    Time 8    Period Weeks    Status New    Target Date 01/06/21      PT LONG TERM GOAL #2   Title Pt will improve 5TSTS score to </= 20 seconds to demonstrate improved functional strength and decreased fall risk    Time 8    Period Weeks    Status New    Target Date 01/06/21      PT LONG TERM GOAL #3   Title Pt will improve TUG score to less than or equal to 25 seconds for decreased fall risk.    Time 8     Period Weeks    Status New    Target Date 01/06/21                 Plan - 11/18/20 1404    Clinical Impression Statement Pt presents with caregiver today and is reportedly not performing HEP at home. He does participate in therapy exercise and was willing to perform whatever he was asked today. His posture is rigid in sitting: flexed trunk, forward head, PPT. Able to correct pelvis to neutral sitting on the front of the hard airex with mild improvement to trunk. Pt did well with multi-directional reaches for increased spinal extension and rotation and upward gaze. He emonstrates some decreased awareness and inattention to L hemi body. Blocked sit <> stand practice performed for improved hand positioning from table <> RW with CGA provided and gait belt used with all standing. Mod assist for towel reaches on wall to improve OH reach, upward gaze, and extension.    Personal Factors and Comorbidities Comorbidity 3+    Comorbidities DM, HTN, CVA, dsyphagia    Examination-Activity Limitations Locomotion Level;Transfers;Stand;Stairs    Examination-Participation Restrictions Community Activity;Other    Stability/Clinical Decision Making Evolving/Moderate complexity    Rehab Potential Fair   cognitive impairments, decreased safety awareness, severity of deficits   PT Frequency 2x / week    PT Duration 8 weeks    PT Treatment/Interventions ADLs/Self Care Home Management;DME Instruction;Neuromuscular re-education;Balance training;Therapeutic exercise;Therapeutic activities;Functional mobility training;Stair training;Gait training;Patient/family education;Energy conservation;Manual techniques    PT Next Visit Plan Review HEP for lower extremity and trunk strengthening/update PRN (not performing right now); transfer training, gait training; wife education for safety with transfers and gait, consider QP positioning at some point progressing to tall kneeling and potentially 1/2 kneel with UE assist;  postural upright, hard airex for pelvic neutral    Consulted and Agree with Plan of Care Patient;Family member/caregiver    Family Member Consulted wife           Patient will benefit from skilled therapeutic intervention in order to improve the following deficits and impairments:  Abnormal gait,Decreased coordination,Difficulty walking,Decreased safety awareness,Decreased balance,Decreased strength,Decreased mobility  Visit Diagnosis: Cognitive communication deficit  Muscle weakness (generalized)  Ataxic gait  Abnormal posture  Other abnormalities of gait and mobility  History of falling  Unsteadiness on feet     Problem List Patient Active Problem List   Diagnosis Date Noted  . Benign essential HTN   . Cognitive deficit, post-stroke   . Dysphagia, post-stroke   . Controlled type 2 diabetes mellitus with hyperglycemia, without long-term current use of insulin ([place])   . Vestibular schwannoma ([place]) 06/03/2019  . 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 Dyslipidemia, goal LDL below 70 03/12/2019  . Dysphagia due to recent stroke 03/12/2019  . Overweight 03/12/2019  . 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 Abnormality of gait   . Gait disturbance, post-stroke   . 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    Izell Evendale, PT, DPT 11/18/2020, 2:09 PM  [place]. [place], [place], [address] Phone: [phone]   Fax:  [phone]  Name: Joe SURRATT Sr. MRN: [id number] Date of Birth: 1950-10-04

## 2020-11-23 ENCOUNTER — Other Ambulatory Visit: Payer: Self-pay

## 2020-11-23 ENCOUNTER — Ambulatory Visit: Payer: Medicare Other

## 2020-11-23 ENCOUNTER — Ambulatory Visit: Payer: Medicare Other | Admitting: Speech Pathology

## 2020-11-23 ENCOUNTER — Encounter: Payer: Self-pay | Admitting: Speech Pathology

## 2020-11-23 DIAGNOSIS — R41841 Cognitive communication deficit: Secondary | ICD-10-CM | POA: Diagnosis not present

## 2020-11-23 DIAGNOSIS — R2681 Unsteadiness on feet: Secondary | ICD-10-CM | POA: Diagnosis not present

## 2020-11-23 DIAGNOSIS — Z9181 History of falling: Secondary | ICD-10-CM

## 2020-11-23 DIAGNOSIS — M6281 Muscle weakness (generalized): Secondary | ICD-10-CM

## 2020-11-23 DIAGNOSIS — R2689 Other abnormalities of gait and mobility: Secondary | ICD-10-CM | POA: Diagnosis not present

## 2020-11-23 DIAGNOSIS — I6329 Cerebral infarction due to unspecified occlusion or stenosis of other precerebral arteries: Secondary | ICD-10-CM | POA: Diagnosis not present

## 2020-11-23 DIAGNOSIS — R260 Ataxic gait: Secondary | ICD-10-CM

## 2020-11-23 DIAGNOSIS — R4701 Aphasia: Secondary | ICD-10-CM

## 2020-11-23 DIAGNOSIS — R293 Abnormal posture: Secondary | ICD-10-CM

## 2020-11-23 NOTE — Patient Instructions (Signed)
[facility]

## 2020-11-23 NOTE — Therapy (Signed)
[place]. [place], [place], [address] Phone: [phone]   Fax:  [phone]  Physical Therapy Treatment  Patient Details  Name: Joe MARLETTE Sr. MRN: [id number] Date of Birth: 10-30-1950 Referring Provider (PT): Asencion Noble, MD   Encounter Date: 11/23/2020   PT End of Session - 11/23/20 1449    Visit Number 3    Number of Visits 17    Date for PT Re-Evaluation 01/06/21    Authorization Type UHC Medicare    PT Start Time 8101    PT Stop Time 1528    PT Time Calculation (min) 42 min    Equipment Utilized During Treatment Gait belt    Activity Tolerance Patient tolerated treatment well    Behavior During Therapy Flat affect           Past Medical History:  Diagnosis Date  . 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 ([place]) 06/03/2019  . Cerebrovascular accident (CVA) due to occlusion of vertebral artery ([place])   . Diabetes mellitus without complication ([place])   . Dysphagia    post stroke  . Hypertension   . Stroke [facility])     Past Surgical History:  Procedure Laterality Date  . IR ANGIO INTRA EXTRACRAN SEL COM CAROTID INNOMINATE UNI L MOD SED  03/10/2019  . IR ANGIO VERTEBRAL SEL SUBCLAVIAN INNOMINATE BILAT MOD SED  03/10/2019  . IR CT HEAD LTD  03/10/2019  . IR PERCUTANEOUS ART THROMBECTOMY/INFUSION INTRACRANIAL INC DIAG ANGIO  03/10/2019  . RADIOLOGY WITH ANESTHESIA N/A 03/10/2019   Procedure: IR WITH ANESTHESIA/CODE STROKE;  Surgeon: Radiologist, Medication, MD;  Location: [place];  Service: Radiology;  Laterality: N/A;    There were no vitals filed for this visit.   Subjective Assessment - 11/23/20 1530    Subjective No new complaints. Not doing HEP    Patient is accompained by: Family member    Limitations Walking    Patient Stated Goals Pt's goals for therapy are to walk better. (pt agrees with wife's statement)    Currently in Pain? No/denies                [facility] Adult PT Treatment/Exercise - 11/23/20 0001      Therapeutic Activites    Other Therapeutic Activities CGA/MIN A provided to ambulate 100 ft with RW, max verbal cues for upward gaze and upright posture, RW proximity (too close at times, increased step lengths and heights - some foot drag left with fatigue)      Knee/Hip Exercises: Standing   Other Standing Knee Exercises Reaches to numbered post its on wall from FWW- emphasis upright posture and trunk extension      Knee/Hip Exercises: Seated   Long Arc Quad Both;2 sets;10 reps    Other Seated Knee/Hip Exercises seated on firm airex front to increased neutral pelvis due to incr posterior pelvic tilt to perform seated reaches multi-directional for rotation and extensionwith cones 7 x 3 B , sit <> stand blocked practice x 10 for safe and appropriate hand placement and mechanics using RW. OHP with lightweight blue ball to PT hand x 10 (bimanual), seated toe raises 10 x 2 B    Marching Both;2 sets;10 reps                    PT Short Term Goals - 11/11/20 1445      PT SHORT TERM GOAL #1   Title Pt will perform  HEP with wife's assistance, for improved strength, balance, gait.    Time 4    Period Weeks    Status New    Target Date 12/09/20             PT Long Term Goals - 11/11/20 1446      PT LONG TERM GOAL #1   Title Pt will perform progressive HEP with family supervision and assistance for improved balance, strength, gait.    Time 8    Period Weeks    Status New    Target Date 01/06/21      PT LONG TERM GOAL #2   Title Pt will improve 5TSTS score to </= 20 seconds to demonstrate improved functional strength and decreased fall risk    Time 8    Period Weeks    Status New    Target Date 01/06/21      PT LONG TERM GOAL #3   Title Pt will improve TUG score to less than or equal to 25 seconds for decreased fall risk.    Time 8    Period Weeks    Status New    Target Date 01/06/21                  Plan - 11/23/20 1457    Clinical Impression Statement Pt presents with caregiver today and is reportedly not performing HEP at home. He does participate in therapy exercise and was willing to perform whatever he was asked today. His posture is rigid in sitting: flexed trunk, forward head, PPT. Able to correct pelvis to neutral sitting on the front of the hard airex with mild improvement to trunk. Pt did well with multi-directional reaches with cones today for increased spinal extension and rotation and upward gaze. He demonstrates some decreased awareness and inattention to L hemi body. Blocked sit <> stand practice performed for improved hand positioning from table <> RW with CGA provided and gait belt used with all standing. Noticed with fatigue he demonstrates increased foot drag on the left but can nicely isolate ankle DF ROM in sitting/nonfunctionally    Personal Factors and Comorbidities Comorbidity 3+    Comorbidities DM, HTN, CVA, dsyphagia    Examination-Activity Limitations Locomotion Level;Transfers;Stand;Stairs    Examination-Participation Restrictions Community Activity;Other    Rehab Potential Fair   cognitive impairments, decreased safety awareness, severity of deficits   PT Frequency 2x / week    PT Duration 8 weeks    PT Treatment/Interventions ADLs/Self Care Home Management;DME Instruction;Neuromuscular re-education;Balance training;Therapeutic exercise;Therapeutic activities;Functional mobility training;Stair training;Gait training;Patient/family education;Energy conservation;Manual techniques    PT Next Visit Plan Review HEP for lower extremity and trunk strengthening/update PRN (not performing right now); transfer training, gait training; wife education for safety with transfers and gait, consider QP positioning at some point progressing to tall kneeling and potentially 1/2 kneel with UE assist; postural upright, hard airex for pelvic neutral    Consulted and Agree  with Plan of Care Patient;Family member/caregiver    Family Member Consulted wife           Patient will benefit from skilled therapeutic intervention in order to improve the following deficits and impairments:  Abnormal gait,Decreased coordination,Difficulty walking,Decreased safety awareness,Decreased balance,Decreased strength,Decreased mobility  Visit Diagnosis: Muscle weakness (generalized)  Ataxic gait  Abnormal posture  Other abnormalities of gait and mobility  History of falling  Unsteadiness on feet     Problem List Patient Active Problem List   Diagnosis Date Noted  . Benign essential  HTN   . Cognitive deficit, post-stroke   . Dysphagia, post-stroke   . Controlled type 2 diabetes mellitus with hyperglycemia, without long-term current use of insulin ([place])   . Vestibular schwannoma ([place]) 06/03/2019  . 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 Dyslipidemia, goal LDL below 70 03/12/2019  . Dysphagia due to recent stroke 03/12/2019  . Overweight 03/12/2019  . 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 Abnormality of gait   . Gait disturbance, post-stroke   . 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    Hall Busing, PT, DPT 11/23/2020, 3:30 PM  [place]. [place], [place], [address] Phone: [phone]   Fax:  [phone]  Name: Joe ERMIS Sr. MRN: [id number] Date of Birth: 1950/11/27

## 2020-11-23 NOTE — Therapy (Signed)
[name], [place], [address] Phone: [phone]   Fax:  [phone]  Speech Language Pathology Treatment  Patient Details  Name: Joe ESSON Sr. MRN: [id number] Date of Birth: 08-11-1951 Referring Provider (SLP): Elsie Stain, MD   Encounter Date: 11/23/2020   End of Session - 11/23/20 1534    Visit Number 9    Number of Visits 25    Date for SLP Re-Evaluation 01/22/21    SLP Start Time 68    SLP Stop Time  1445    SLP Time Calculation (min) 45 min    Activity Tolerance Patient tolerated treatment well           Past Medical History:  Diagnosis Date  . 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 ([place]) 06/03/2019  . Cerebrovascular accident (CVA) due to occlusion of vertebral artery ([place])   . Diabetes mellitus without complication ([place])   . Dysphagia    post stroke  . Hypertension   . Stroke [facility])     Past Surgical History:  Procedure Laterality Date  . IR ANGIO INTRA EXTRACRAN SEL COM CAROTID INNOMINATE UNI L MOD SED  03/10/2019  . IR ANGIO VERTEBRAL SEL SUBCLAVIAN INNOMINATE BILAT MOD SED  03/10/2019  . IR CT HEAD LTD  03/10/2019  . IR PERCUTANEOUS ART THROMBECTOMY/INFUSION INTRACRANIAL INC DIAG ANGIO  03/10/2019  . RADIOLOGY WITH ANESTHESIA N/A 03/10/2019   Procedure: IR WITH ANESTHESIA/CODE STROKE;  Surgeon: Radiologist, Medication, MD;  Location: [place];  Service: Radiology;  Laterality: N/A;    There were no vitals filed for this visit.   Subjective Assessment - 11/23/20 1445    Subjective Pt wife reports he is not talking much at home.    Patient is accompained by: Family member    Currently in Pain? No/denies                 ADULT SLP TREATMENT - 11/23/20 1533      General Information   Behavior/Cognition Alert;Cooperative;Pleasant mood;Distractible;Decreased sustained attention      Treatment Provided   Treatment provided  Cognitive-Linquistic      Cognitive-Linquistic Treatment   Treatment focused on Cognition;Aphasia    Skilled Treatment Addressed visual scanning, attention, and organization by matching items to category. Pt was able to navigate mouse and computer with minA (he did technology for work) and required less assistance this date to categorize. Educated wife on [place] and completing some naming at home.      Assessment / Recommendations / Plan   Plan Continue with current plan of care      Progression Toward Goals   Progression toward goals Progressing toward goals              SLP Short Term Goals - 11/23/20 1536      SLP SHORT TERM GOAL #1   Title Pt will make a choice between items in given list of options provided with <2 verbal/visual cues.    Time 2    Period Weeks    Status On-going      SLP SHORT TERM GOAL #2   Title Pt will swallow saliva prior to each communicative interaction given a <2 verbal/tactile cues.    Time 2    Period Weeks    Status On-going      SLP SHORT TERM GOAL #3   Title Pt will demonstrate sustained attention to task given a personally relevant task for >  5 minutes.    Baseline <5 min    Time 2    Period Weeks    Status On-going            SLP Long Term Goals - 11/23/20 1536      SLP LONG TERM GOAL #1   Title Client will utilize compensatory strategies to communicate wants and needs effectively to different conversational partners and participate socially in functional living environment.    Time 8    Period Weeks    Status On-going      SLP LONG TERM GOAL #2   Title Client will demonstrate understanding of verbal communication related to daily activities and utilize compensatory strategies to more effectively communicate in their functional living environment.    Time 8    Period Weeks    Status On-going      SLP LONG TERM GOAL #3   Title Patient will develop functional attention skills to effectively attend to and communicate in tasks  of daily living in their functional living environment.    Time 8    Period Weeks    Status On-going            Plan - 11/23/20 1536    Clinical Impression Statement Pt is a 70 yo male who presents with severe cognitive-communication impairment and dysphagia 2/2 to cognitive impairment. Pt wife accompanied pt and required encouragement to participate in answering questions regarding impairments seen at home. Pt required verbal and tactile cueing to swallow saliva before speaking. With cueing before each communicative interaction, pt was able to participate in today's evaluation verbally. Pt experienced significantly delayed initation in answering questions and following directions. Pt wife reported her primary concern is with his lack of communication of his needs/wants. She reports mostly mumbling at home with occasional greetings. SLUMS was conducted this date to determine extent of cognitive deficit. Patient required repetition, visual, and verbal cueing to complete. Patient received a 4/30 on today's assessment; however, pt demonstrated increased accuracy with verbal cueing and when provided with multiple choice cue. SLP rec skilled speech services to increase patient's ability to communicate needs/wants and further participation in ADLs to relieve caregiver burden.    Speech Therapy Frequency 2x / week    Duration 12 weeks    Treatment/Interventions Aspiration precaution training;Cueing hierarchy;Patient/family education;Functional tasks;Environmental controls;Cognitive reorganization;Multimodal communcation approach;Language facilitation;Compensatory techniques;Internal/external aids;SLP instruction and feedback;Compensatory strategies    Potential to Achieve Goals Fair    Potential Considerations Ability to learn/carryover information;Severity of impairments    Consulted and Agree with Plan of Care Patient;Family member/caregiver    Family Member Consulted Pt wife           Patient will  benefit from skilled therapeutic intervention in order to improve the following deficits and impairments:   Cognitive communication deficit  Aphasia    Problem List Patient Active Problem List   Diagnosis Date Noted  . Benign essential HTN   . Cognitive deficit, post-stroke   . Dysphagia, post-stroke   . Controlled type 2 diabetes mellitus with hyperglycemia, without long-term current use of insulin ([place])   . 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 Dyslipidemia, goal LDL below 70 03/12/2019  . Dysphagia due to recent stroke 03/12/2019  . Overweight 03/12/2019  . 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 Abnormality of gait   . Gait disturbance, post-stroke   .  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    [name], [place], [name]  11/23/2020, 3:37 PM  [place]. [place], [place], [address] Phone: [phone]   Fax:  [phone]   Name: Joe BLOOR Sr. MRN: [id number] Date of Birth: 04-Jan-1951

## 2020-11-25 ENCOUNTER — Ambulatory Visit: Payer: Medicare Other

## 2020-11-25 ENCOUNTER — Encounter: Payer: Self-pay | Admitting: Speech Pathology

## 2020-11-25 ENCOUNTER — Other Ambulatory Visit: Payer: Self-pay

## 2020-11-25 ENCOUNTER — Ambulatory Visit: Payer: Medicare Other | Admitting: Speech Pathology

## 2020-11-25 DIAGNOSIS — R2681 Unsteadiness on feet: Secondary | ICD-10-CM

## 2020-11-25 DIAGNOSIS — I6329 Cerebral infarction due to unspecified occlusion or stenosis of other precerebral arteries: Secondary | ICD-10-CM | POA: Diagnosis not present

## 2020-11-25 DIAGNOSIS — R293 Abnormal posture: Secondary | ICD-10-CM | POA: Diagnosis not present

## 2020-11-25 DIAGNOSIS — M6281 Muscle weakness (generalized): Secondary | ICD-10-CM | POA: Diagnosis not present

## 2020-11-25 DIAGNOSIS — R41841 Cognitive communication deficit: Secondary | ICD-10-CM | POA: Diagnosis not present

## 2020-11-25 DIAGNOSIS — R4701 Aphasia: Secondary | ICD-10-CM

## 2020-11-25 DIAGNOSIS — R260 Ataxic gait: Secondary | ICD-10-CM

## 2020-11-25 DIAGNOSIS — Z9181 History of falling: Secondary | ICD-10-CM | POA: Diagnosis not present

## 2020-11-25 DIAGNOSIS — R2689 Other abnormalities of gait and mobility: Secondary | ICD-10-CM | POA: Diagnosis not present

## 2020-11-25 NOTE — Therapy (Addendum)
[place]. [place], [place], [address] Phone: [phone]   Fax:  [phone]  Speech Language Pathology Treatment & Progress Note  Patient Details  Name: Joe Perry Sr. 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 ([place]) 06/03/2019   Cerebrovascular accident (CVA) due to occlusion of vertebral artery ([name])    Diabetes mellitus without complication ([place])    Dysphagia    post stroke   Hypertension    Stroke [facility])     Past Surgical History:  Procedure Laterality Date   IR ANGIO INTRA EXTRACRAN SEL COM CAROTID INNOMINATE UNI L MOD SED  03/10/2019   IR ANGIO VERTEBRAL SEL SUBCLAVIAN INNOMINATE BILAT MOD SED  03/10/2019   IR CT HEAD LTD  03/10/2019   IR PERCUTANEOUS ART THROMBECTOMY/INFUSION INTRACRANIAL INC DIAG ANGIO  03/10/2019   RADIOLOGY WITH ANESTHESIA N/A 03/10/2019   Procedure: IR WITH ANESTHESIA/CODE STROKE;  Surgeon: Radiologist, Medication, MD;  Location: [facility];  Service: Radiology;  Laterality: N/A;    There were no vitals filed for this visit.   Subjective Assessment - 11/25/20 1410    Subjective He reports he has been talking more at home to his wife.    Currently in Pain? No/denies                 ADULT SLP TREATMENT - 11/25/20 1435      General Information   Behavior/Cognition Alert;Cooperative;Pleasant mood;Distractible;Decreased sustained attention      Treatment Provided   Treatment provided  Cognitive-Linquistic      Cognitive-Linquistic Treatment   Treatment focused on Cognition;Aphasia    Skilled Treatment Addressed naming and organization. Pt has made significant improvement on naming categories. Pt does better when given multiple choice answers verbally vs. pictures. At the beginning, pt was unable to name any pictures. Pt is now able to name 60% of objects given sentence completion or phonemic cues. He continues to make improvement with swallowing saliva, and does more swallowing spontaneously when engaged in conversation.      Assessment / Recommendations / Plan   Plan Continue with current plan of care      Progression Toward Goals   Progression toward goals Progressing toward goals              SLP Short Term Goals - 11/25/20 1412      SLP SHORT TERM GOAL #1   Title Pt will make a choice between items in given list of options provided with <2 verbal/visual cues.    Time 2    Period Weeks    Status On-going      SLP SHORT TERM GOAL #2   Title Pt will swallow saliva prior to each communicative interaction given a <2 verbal/tactile cues.    Time 2    Period Weeks    Status On-going      SLP SHORT TERM GOAL #  3   Title Pt will demonstrate sustained attention to task given a personally relevant task for >5 minutes.    Baseline <5 min    Time 2    Period Weeks    Status On-going            SLP Long Term Goals - 11/25/20 1412      SLP LONG TERM GOAL #1   Title Client will utilize compensatory strategies to communicate wants and needs effectively to different conversational partners and participate socially in functional living environment.    Time 8    Period Weeks    Status On-going      SLP LONG TERM GOAL #2   Title Client will demonstrate understanding of verbal communication related to daily activities and utilize compensatory strategies to more effectively communicate in their functional living environment.    Time 8    Period Weeks    Status  On-going      SLP LONG TERM GOAL #3   Title Patient will develop functional attention skills to effectively attend to and communicate in tasks of daily living in their functional living environment.    Time 8    Period Weeks    Status On-going            Plan - 11/25/20 1412    Clinical Impression Statement Pt is a 70 yo male who presents with severe cognitive-communication impairment and dysphagia 2/2 to cognitive impairment. Pt wife accompanied pt and required encouragement to participate in answering questions regarding impairments seen at home. Pt required verbal and tactile cueing to swallow saliva before speaking. With cueing before each communicative interaction, pt was able to participate in today's evaluation verbally. Pt experienced significantly delayed initation in answering questions and following directions. Pt wife reported her primary concern is with his lack of communication of his needs/wants. She reports mostly mumbling at home with occasional greetings. SLUMS was conducted this date to determine extent of cognitive deficit. Patient required repetition, visual, and verbal cueing to complete. Patient received a 4/30 on today's assessment; however, pt demonstrated increased accuracy with verbal cueing and when provided with multiple choice cue. SLP rec skilled speech services to increase patient's ability to communicate needs/wants and further participation in ADLs to relieve caregiver burden.    Speech Therapy Frequency 2x / week    Duration 12 weeks    Treatment/Interventions Aspiration precaution training;Cueing hierarchy;Patient/family education;Functional tasks;Environmental controls;Cognitive reorganization;Multimodal communcation approach;Language facilitation;Compensatory techniques;Internal/external aids;SLP instruction and feedback;Compensatory strategies    Potential to Achieve Goals Fair    Potential Considerations Ability to learn/carryover information;Severity of  impairments    Consulted and Agree with Plan of Care Patient;Family member/caregiver           Patient will benefit from skilled therapeutic intervention in order to improve the following deficits and impairments:   Aphasia  Cognitive communication deficit    Problem List Patient Active Problem List   Diagnosis Date Noted   Benign essential HTN    Cognitive deficit, post-stroke    Dysphagia, post-stroke    Controlled type 2 diabetes mellitus with hyperglycemia, without long-term current use of insulin ([place])    Vestibular schwannoma ([place]) 06/03/2019   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  Objective Measurements: See tx notes  Goal Update: See above  Plan: Continue to address expressive language skills.   Reason Skilled Services are Required: Patient requires skilled speech services to enhance his ability to communicate wants and needs to his wife and participate more fully in everyday life.   Rosann Auerbach [place] MS, [place], [name]  11/25/2020, 2:39 PM  [place]. [place], [place], [address] Phone: [phone]   Fax:  [phone]   Name: SINAI MAHANY Sr. MRN: [id number] Date of Birth: 04/09/1951

## 2020-11-25 NOTE — Therapy (Signed)
[address] [place] [place], [place], [address] Phone: [phone]   Fax:  [phone]  Physical Therapy Treatment  Patient Details  Name: Joe HILLMAN Sr. MRN: [id number] Date of Birth: [birth year] Referring Provider (PT): Asencion Noble, MD   Encounter Date: 11/25/2020   PT End of Session - 11/25/20 1529    Visit Number 4    Number of Visits 17    Date for PT Re-Evaluation 01/06/21    Authorization Type [employer] Medicare    PT Start Time 3825    PT Stop Time 1527    PT Time Calculation (min) 42 min    Equipment Utilized During Treatment Gait belt    Activity Tolerance Patient tolerated treatment well    Behavior During Therapy Flat affect           Past Medical History:  Diagnosis Date  . 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 Cerebrovascular accident (CVA) due to occlusion of vertebral artery ([place])   . Diabetes mellitus without complication ([place])   . Dysphagia    post stroke  . Hypertension   . Stroke [facility])     Past Surgical History:  Procedure Laterality Date  . IR ANGIO INTRA EXTRACRAN SEL COM CAROTID INNOMINATE UNI L MOD SED  03/10/2019  . IR ANGIO VERTEBRAL SEL SUBCLAVIAN INNOMINATE BILAT MOD SED  03/10/2019  . IR CT HEAD LTD  03/10/2019  . IR PERCUTANEOUS ART THROMBECTOMY/INFUSION INTRACRANIAL INC DIAG ANGIO  03/10/2019  . RADIOLOGY WITH ANESTHESIA N/A 03/10/2019   Procedure: IR WITH ANESTHESIA/CODE STROKE;  Surgeon: Radiologist, Medication, MD;  Location: [facility];  Service: Radiology;  Laterality: N/A;    There were no vitals filed for this visit.   Subjective Assessment - 11/25/20 1529    Subjective No new complaints. Not doing HEP    Patient is accompained by: Family member    Limitations Walking    Patient Stated Goals Pt's goals for therapy are to walk better. (pt agrees with wife's statement)    Currently in Pain? No/denies                [facility] Adult PT Treatment/Exercise - 11/25/20 0001      High Level Balance   High Level Balance Comments forward and backward step taking with FWW max cues for walker advancement, upright gaze      Therapeutic Activites    Other Therapeutic Activities CGA/MIN A provided to ambulate 100 ft with RW, max verbal cues for upward gaze and upright posture, RW proximity (too close at times, increased step lengths and heights - some foot drag left with fatigue)      Knee/Hip Exercises: Aerobic   Nustep L1 x 6 minutes      Knee/Hip Exercises: Standing   Other Standing Knee Exercises Reaches to numbered post its on wall from FWW- emphasis upright posture and trunk extension. Standing marches with walker      Knee/Hip Exercises: Seated   Long Arc Quad Both;2 sets;10 reps    Marching Both;2 sets;10 reps   tapping cone target in sitting   Sit to Sand 2 sets;10 reps;with UE support   tactile cues for upright posture                   PT Short Term Goals - 11/11/20 1445      PT SHORT TERM GOAL #1   Title Pt will perform HEP with  wife's assistance, for improved strength, balance, gait.    Time 4    Period Weeks    Status New    Target Date 12/09/20             PT Long Term Goals - 11/11/20 1446      PT LONG TERM GOAL #1   Title Pt will perform progressive HEP with family supervision and assistance for improved balance, strength, gait.    Time 8    Period Weeks    Status New    Target Date 01/06/21      PT LONG TERM GOAL #2   Title Pt will improve 5TSTS score to </= 20 seconds to demonstrate improved functional strength and decreased fall risk    Time 8    Period Weeks    Status New    Target Date 01/06/21      PT LONG TERM GOAL #3   Title Pt will improve TUG score to less than or equal to 25 seconds for decreased fall risk.    Time 8    Period Weeks    Status New    Target Date 01/06/21                 Plan - 11/25/20 1530    Clinical Impression  Statement Pt tolerated all exercises well today. Alot of cues for upright gaze and RW advancement, slight improvement in upward gaze when hat was taken off. Did more standing exercises with marches and forward backward steps with alot of cues needed.    Personal Factors and Comorbidities Comorbidity 3+    Comorbidities DM, HTN, CVA, dsyphagia    Examination-Activity Limitations Locomotion Level;Transfers;Stand;Stairs    Examination-Participation Restrictions Community Activity;Other    Rehab Potential Fair   cognitive impairments, decreased safety awareness, severity of deficits   PT Frequency 2x / week    PT Duration 8 weeks    PT Treatment/Interventions ADLs/Self Care Home Management;DME Instruction;Neuromuscular re-education;Balance training;Therapeutic exercise;Therapeutic activities;Functional mobility training;Stair training;Gait training;Patient/family education;Energy conservation;Manual techniques    PT Next Visit Plan Review HEP for lower extremity and trunk strengthening/update PRN (not performing right now); transfer training, gait training; wife education for safety with transfers and gait, consider QP positioning at some point progressing to tall kneeling and potentially 1/2 kneel with UE assist; postural upright, hard airex for pelvic neutral    Consulted and Agree with Plan of Care Patient;Family member/caregiver    Family Member Consulted wife           Patient will benefit from skilled therapeutic intervention in order to improve the following deficits and impairments:  Abnormal gait,Decreased coordination,Difficulty walking,Decreased safety awareness,Decreased balance,Decreased strength,Decreased mobility  Visit Diagnosis: Muscle weakness (generalized)  Ataxic gait  Abnormal posture  Other abnormalities of gait and mobility  History of falling  Unsteadiness on feet     Problem List Patient Active Problem List   Diagnosis Date Noted  . Benign essential HTN   .  Cognitive deficit, post-stroke   . Dysphagia, post-stroke   . Controlled type 2 diabetes mellitus with hyperglycemia, without long-term current use of insulin ([place])   . 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 Dyslipidemia, goal LDL below 70 03/12/2019  . Dysphagia due to recent stroke 03/12/2019  . Overweight 03/12/2019  . 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 Abnormality of gait   .  Gait disturbance, post-stroke   . 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 [place], [place], [address] Phone: [phone]   Fax:  [phone]  Name: Joe PRUSINSKI Sr. MRN: [id number] Date of Birth: Dec 19, 1950

## 2020-11-30 ENCOUNTER — Ambulatory Visit: Payer: Medicare Other | Admitting: Speech Pathology

## 2020-11-30 ENCOUNTER — Other Ambulatory Visit: Payer: Self-pay

## 2020-11-30 ENCOUNTER — Encounter: Payer: Self-pay | Admitting: Speech Pathology

## 2020-11-30 DIAGNOSIS — M6281 Muscle weakness (generalized): Secondary | ICD-10-CM | POA: Diagnosis not present

## 2020-11-30 DIAGNOSIS — R4701 Aphasia: Secondary | ICD-10-CM

## 2020-11-30 DIAGNOSIS — R41841 Cognitive communication deficit: Secondary | ICD-10-CM

## 2020-11-30 DIAGNOSIS — R2681 Unsteadiness on feet: Secondary | ICD-10-CM | POA: Diagnosis not present

## 2020-11-30 DIAGNOSIS — R293 Abnormal posture: Secondary | ICD-10-CM | POA: Diagnosis not present

## 2020-11-30 DIAGNOSIS — I6329 Cerebral infarction due to unspecified occlusion or stenosis of other precerebral arteries: Secondary | ICD-10-CM | POA: Diagnosis not present

## 2020-11-30 DIAGNOSIS — R2689 Other abnormalities of gait and mobility: Secondary | ICD-10-CM | POA: Diagnosis not present

## 2020-11-30 DIAGNOSIS — R260 Ataxic gait: Secondary | ICD-10-CM | POA: Diagnosis not present

## 2020-11-30 DIAGNOSIS — Z9181 History of falling: Secondary | ICD-10-CM | POA: Diagnosis not present

## 2020-11-30 NOTE — Therapy (Signed)
[place]. [place], [place], [address] Phone: [phone]   Fax:  [phone]  Speech Language Pathology Treatment  Patient Details  Name: Joe Perry Sr. MRN: [id number] Date of Birth: 02/17/1951 Referring Provider (SLP): Elsie Stain, MD   Encounter Date: 11/30/2020   End of Session - 11/30/20 1418    Visit Number 11    Number of Visits 25    Date for SLP Re-Evaluation 01/22/21    SLP Start Time 35    SLP Stop Time  1440    SLP Time Calculation (min) 40 min    Activity Tolerance Patient tolerated treatment well           Past Medical History:  Diagnosis Date  . 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 Diabetes mellitus without complication ([place])   . Dysphagia    post stroke  . Hypertension   . Stroke [facility])     Past Surgical History:  Procedure Laterality Date  . IR ANGIO INTRA EXTRACRAN SEL COM CAROTID INNOMINATE UNI L MOD SED  03/10/2019  . IR ANGIO VERTEBRAL SEL SUBCLAVIAN INNOMINATE BILAT MOD SED  03/10/2019  . IR CT HEAD LTD  03/10/2019  . IR PERCUTANEOUS ART THROMBECTOMY/INFUSION INTRACRANIAL INC DIAG ANGIO  03/10/2019  . RADIOLOGY WITH ANESTHESIA N/A 03/10/2019   Procedure: IR WITH ANESTHESIA/CODE STROKE;  Surgeon: Radiologist, Medication, MD;  Location: [facility];  Service: Radiology;  Laterality: N/A;    There were no vitals filed for this visit.   Subjective Assessment - 11/30/20 1403    Subjective Pt has no complaints. Continues to require encouragment to vocalize upon being greeted.    Currently in Pain? No/denies                 ADULT SLP TREATMENT - 11/30/20 1403      General Information   Behavior/Cognition Alert;Cooperative;Pleasant mood;Distractible;Decreased sustained attention      Treatment Provided   Treatment provided  Cognitive-Linquistic      Cognitive-Linquistic Treatment   Treatment focused on Cognition;Aphasia    Skilled Treatment Addressing confrontation naming. Pt demonstrated semantic paraphasias "cup" for "plate". He benefits from cloze sentence and phonemic cues. Pt was able to independently name 4 items. He required cueing for 18 items. Pt demonstrated poor sustained attention to task today, requiring redirection several times throughout a task. Difficulty following a sequence of 3. Scaffolded to sequence of two during sustained attention task.      Assessment / Recommendations / Plan   Plan Continue with current plan of care              SLP Short Term Goals - 11/30/20 1419      SLP SHORT TERM GOAL #1   Title Pt will make a choice between items in given list of options provided with <2 verbal/visual cues.    Time 1    Period Weeks    Status On-going      SLP SHORT TERM GOAL #2   Title Pt will swallow saliva prior to each communicative interaction given a <2 verbal/tactile cues.    Time 1    Period Weeks    Status On-going      SLP SHORT TERM GOAL #3   Title Pt will demonstrate sustained attention to task given a personally relevant task for >5 minutes.  Baseline <5 min    Time 1    Period Weeks    Status On-going            SLP Long Term Goals - 11/30/20 1420      SLP LONG TERM GOAL #1   Title Client will utilize compensatory strategies to communicate wants and needs effectively to different conversational partners and participate socially in functional living environment.    Time 7    Period Weeks    Status On-going      SLP LONG TERM GOAL #2   Title Client will demonstrate understanding of verbal communication related to daily activities and utilize compensatory strategies to more effectively communicate in their functional living environment.    Time 7    Period Weeks    Status On-going      SLP LONG TERM GOAL #3   Title Patient will develop functional  attention skills to effectively attend to and communicate in tasks of daily living in their functional living environment.    Time 7    Period Weeks    Status On-going            Plan - 11/30/20 1418    Clinical Impression Statement Pt is a 70 yo male who presents with severe cognitive-communication impairment and dysphagia 2/2 to cognitive impairment. Pt wife accompanied pt and required encouragement to participate in answering questions regarding impairments seen at home. Pt required verbal and tactile cueing to swallow saliva before speaking. With cueing before each communicative interaction, pt was able to participate in today's evaluation verbally. Pt experienced significantly delayed initation in answering questions and following directions. Pt wife reported her primary concern is with his lack of communication of his needs/wants. She reports mostly mumbling at home with occasional greetings. SLUMS was conducted this date to determine extent of cognitive deficit. Patient required repetition, visual, and verbal cueing to complete. Patient received a 4/30 on today's assessment; however, pt demonstrated increased accuracy with verbal cueing and when provided with multiple choice cue. SLP rec skilled speech services to increase patient's ability to communicate needs/wants and further participation in ADLs to relieve caregiver burden.    Speech Therapy Frequency 2x / week    Duration 12 weeks    Treatment/Interventions Aspiration precaution training;Cueing hierarchy;Patient/family education;Functional tasks;Environmental controls;Cognitive reorganization;Multimodal communcation approach;Language facilitation;Compensatory techniques;Internal/external aids;SLP instruction and feedback;Compensatory strategies    Potential to Achieve Goals Fair    Potential Considerations Ability to learn/carryover information;Severity of impairments    Consulted and Agree with Plan of Care Patient;Family  member/caregiver           Patient will benefit from skilled therapeutic intervention in order to improve the following deficits and impairments:   Cognitive communication deficit  Aphasia    Problem List Patient Active Problem List   Diagnosis Date Noted  . Benign essential HTN   . Cognitive deficit, post-stroke   . Dysphagia, post-stroke   . Controlled type 2 diabetes mellitus with hyperglycemia, without long-term current use of insulin ([place])   . 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 Dyslipidemia, goal LDL below 70 03/12/2019  . Dysphagia due to recent stroke 03/12/2019  . Overweight 03/12/2019  . 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 Abnormality of gait   . Gait disturbance, post-stroke   . 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 [place], [place], [address] Phone: [phone]   Fax:  [phone]   Name: TARRANCE JANUSZEWSKI Sr. MRN: [id number] Date of Birth: Apr 26, 1951

## 2020-11-30 NOTE — Patient Instructions (Signed)
Aphasia Therapy: How to Practice at [facility] . Go around the rooms in your home and name each object you see.  . Go outside and name what you see. [place] Open drawers, cabinets, closets and name the items you see. . Look around and name items that begin with a certain letter. . Look around and name items that are a certain color.    *Created by [employer] (2019)

## 2020-12-02 ENCOUNTER — Other Ambulatory Visit: Payer: Self-pay

## 2020-12-02 ENCOUNTER — Ambulatory Visit: Payer: Medicare Other

## 2020-12-02 ENCOUNTER — Encounter: Payer: Self-pay | Admitting: Speech Pathology

## 2020-12-02 ENCOUNTER — Ambulatory Visit: Payer: Medicare Other | Admitting: Speech Pathology

## 2020-12-02 DIAGNOSIS — R41841 Cognitive communication deficit: Secondary | ICD-10-CM

## 2020-12-02 DIAGNOSIS — Z9181 History of falling: Secondary | ICD-10-CM

## 2020-12-02 DIAGNOSIS — R2681 Unsteadiness on feet: Secondary | ICD-10-CM | POA: Diagnosis not present

## 2020-12-02 DIAGNOSIS — R2689 Other abnormalities of gait and mobility: Secondary | ICD-10-CM | POA: Diagnosis not present

## 2020-12-02 DIAGNOSIS — R260 Ataxic gait: Secondary | ICD-10-CM

## 2020-12-02 DIAGNOSIS — R4701 Aphasia: Secondary | ICD-10-CM | POA: Diagnosis not present

## 2020-12-02 DIAGNOSIS — R293 Abnormal posture: Secondary | ICD-10-CM

## 2020-12-02 DIAGNOSIS — M6281 Muscle weakness (generalized): Secondary | ICD-10-CM

## 2020-12-02 DIAGNOSIS — I6329 Cerebral infarction due to unspecified occlusion or stenosis of other precerebral arteries: Secondary | ICD-10-CM | POA: Diagnosis not present

## 2020-12-02 NOTE — Therapy (Signed)
[facility]. [place], [place], [address] Phone: [phone]   Fax:  [phone]  Physical Therapy Treatment  Patient Details  Name: Joe SPANGLER Sr. MRN: [id number] Date of Birth: 10-18-1950 Referring Provider (PT): Asencion Noble, MD   Encounter Date: 12/02/2020   PT End of Session - 12/02/20 1419    Visit Number 5    Number of Visits 17    Date for PT Re-Evaluation 01/06/21    Authorization Type [employer] Medicare    PT Start Time 1400    PT Stop Time 1440    PT Time Calculation (min) 40 min    Equipment Utilized During Treatment Gait belt    Activity Tolerance Patient tolerated treatment well;Patient limited by fatigue    Behavior During Therapy Flat affect           Past Medical History:  Diagnosis Date  . 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 ([place]) 06/03/2019  . Cerebrovascular accident (CVA) due to occlusion of vertebral artery ([place])   . Diabetes mellitus without complication ([place])   . Dysphagia    post stroke  . Hypertension   . Stroke [facility])     Past Surgical History:  Procedure Laterality Date  . IR ANGIO INTRA EXTRACRAN SEL COM CAROTID INNOMINATE UNI L MOD SED  03/10/2019  . IR ANGIO VERTEBRAL SEL SUBCLAVIAN INNOMINATE BILAT MOD SED  03/10/2019  . IR CT HEAD LTD  03/10/2019  . IR PERCUTANEOUS ART THROMBECTOMY/INFUSION INTRACRANIAL INC DIAG ANGIO  03/10/2019  . RADIOLOGY WITH ANESTHESIA N/A 03/10/2019   Procedure: IR WITH ANESTHESIA/CODE STROKE;  Surgeon: Radiologist, Medication, MD;  Location: [facility];  Service: Radiology;  Laterality: N/A;    There were no vitals filed for this visit.   Subjective Assessment - 12/02/20 1406    Subjective No new complaints. Not doing HEP    Patient is accompained by: Family member    Limitations Walking    Patient Stated Goals Pt's goals for therapy are to walk better. (pt agrees with wife's statement)    Currently in  Pain? No/denies                 [facility] Adult PT Treatment/Exercise - 12/02/20 0001      High Level Balance   High Level Balance Comments forward and backward step taking with FWW max cues for walker advancement, upright gaze      Therapeutic Activites    Other Therapeutic Activities CGA/MIN A provided to ambulate 100 ft with RW, max verbal cues for upward gaze and upright posture, RW proximity (too close at times, increased step lengths and heights - some foot drag left with fatigue). Multidirecitonal reach and place cones x10 B      Knee/Hip Exercises: Aerobic   Nustep L2 x 6 minutes      Knee/Hip Exercises: Standing   Other Standing Knee Exercises Marches with 2# AW B x10 alternating      Knee/Hip Exercises: Seated   Long Arc Quad Both;10 reps;1 set    [employer] Weight 2 lbs.    Marching Both;2 sets;10 reps   tapping cone target in sitting   Hamstring Curl Both;1 set;10 reps    Hamstring Limitations green    Sit to Sand 2 sets;10 reps;with UE support   tactile cues for upright posture                   PT Short  Term Goals - 11/11/20 1445      PT SHORT TERM GOAL #1   Title Pt will perform HEP with wife's assistance, for improved strength, balance, gait.    Time 4    Period Weeks    Status New    Target Date 12/09/20             PT Long Term Goals - 11/11/20 1446      PT LONG TERM GOAL #1   Title Pt will perform progressive HEP with family supervision and assistance for improved balance, strength, gait.    Time 8    Period Weeks    Status New    Target Date 01/06/21      PT LONG TERM GOAL #2   Title Pt will improve 5TSTS score to </= 20 seconds to demonstrate improved functional strength and decreased fall risk    Time 8    Period Weeks    Status New    Target Date 01/06/21      PT LONG TERM GOAL #3   Title Pt will improve TUG score to less than or equal to 25 seconds for decreased fall risk.    Time 8    Period Weeks    Status New     Target Date 01/06/21                 Plan - 12/02/20 1420    Clinical Impression Statement Pt tolerated all exercises well today. Alot of cues for upright gaze and RW advancement, slight improvement in upward gaze without hat today. slightly more spontaneous improvement in upright posture today whenon nustep and in standing. Did more standing exercises with marches and forward backward steps with alot of cues needed, weakness along the left side. He leaves his weight shifted over to his right side in sitting and standing, will plan to continue to work on overall postural strengthening, balance and stability.    Personal Factors and Comorbidities Comorbidity 3+    Comorbidities DM, HTN, CVA, dsyphagia    Examination-Activity Limitations Locomotion Level;Transfers;Stand;Stairs    Examination-Participation Restrictions Community Activity;Other    Rehab Potential Fair   cognitive impairments, decreased safety awareness, severity of deficits   PT Frequency 2x / week    PT Duration 8 weeks    PT Treatment/Interventions ADLs/Self Care Home Management;DME Instruction;Neuromuscular re-education;Balance training;Therapeutic exercise;Therapeutic activities;Functional mobility training;Stair training;Gait training;Patient/family education;Energy conservation;Manual techniques    PT Next Visit Plan Transfer training, gait training; wife education for safety with transfers and gait, consider QP positioning at some point progressing to tall kneeling and potentially 1/2 kneel with UE assist; postural upright, hard airex for pelvic neutral.  Can trial next visit with small wedge or towel roll under the left to see if we can kick in more trunk righting reactions and active WS left    Consulted and Agree with Plan of Care Patient;Family member/caregiver    Family Member Consulted wife           Patient will benefit from skilled therapeutic intervention in order to improve the following deficits and  impairments:  Abnormal gait,Decreased coordination,Difficulty walking,Decreased safety awareness,Decreased balance,Decreased strength,Decreased mobility  Visit Diagnosis: Muscle weakness (generalized)  Ataxic gait  Abnormal posture  Other abnormalities of gait and mobility  History of falling  Unsteadiness on feet     Problem List Patient Active Problem List   Diagnosis Date Noted  . Benign essential HTN   . Cognitive deficit, post-stroke   . Dysphagia, post-stroke   .  Controlled type 2 diabetes mellitus with hyperglycemia, without long-term current use of insulin ([place])   . 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 Dyslipidemia, goal LDL below 70 03/12/2019  . Dysphagia due to recent stroke 03/12/2019  . Overweight 03/12/2019  . 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 Abnormality of gait   . Gait disturbance, post-stroke   . 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    Hall Busing, PT, DPT 12/02/2020, 3:48 PM  [facility]. [place], [place], [address] Phone: [phone]   Fax:  [phone]  Name: Joe GRAZIANI Sr. MRN: [id number] Date of Birth: [birth year]

## 2020-12-02 NOTE — Therapy (Signed)
[facility]. [place], [place], [address] Phone: [phone]   Fax:  [phone]  Speech Language Pathology Treatment  Patient Details  Name: Joe Perry Sr. MRN: [id number] Date of Birth: 23-Jan-1951 Referring Provider (SLP): Elsie Stain, MD   Encounter Date: 12/02/2020   End of Session - 12/02/20 1351    Visit Number 12    Number of Visits 25    Date for SLP Re-Evaluation 01/22/21    SLP Start Time 1316    SLP Stop Time  1356    SLP Time Calculation (min) 40 min    Activity Tolerance Patient tolerated treatment well;Patient limited by fatigue           Past Medical History:  Diagnosis Date  . 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 ([place]) 06/03/2019  . Cerebrovascular accident (CVA) due to occlusion of vertebral artery ([place])   . Diabetes mellitus without complication ([place])   . Dysphagia    post stroke  . Hypertension   . Stroke [facility])     Past Surgical History:  Procedure Laterality Date  . IR ANGIO INTRA EXTRACRAN SEL COM CAROTID INNOMINATE UNI L MOD SED  03/10/2019  . IR ANGIO VERTEBRAL SEL SUBCLAVIAN INNOMINATE BILAT MOD SED  03/10/2019  . IR CT HEAD LTD  03/10/2019  . IR PERCUTANEOUS ART THROMBECTOMY/INFUSION INTRACRANIAL INC DIAG ANGIO  03/10/2019  . RADIOLOGY WITH ANESTHESIA N/A 03/10/2019   Procedure: IR WITH ANESTHESIA/CODE STROKE;  Surgeon: Radiologist, Medication, MD;  Location: [facility];  Service: Radiology;  Laterality: N/A;    There were no vitals filed for this visit.   Subjective Assessment - 12/02/20 1323    Subjective Pt was quiet upon entry. Appears to be tired today.    Patient is accompained by: Family member    Currently in Pain? No/denies                 ADULT SLP TREATMENT - 12/02/20 1324      General Information   Behavior/Cognition Alert;Cooperative;Pleasant mood;Distractible;Decreased sustained attention       Treatment Provided   Treatment provided Cognitive-Linquistic      Cognitive-Linquistic Treatment   Treatment focused on Cognition;Aphasia    Skilled Treatment Pt named tangible objects with 44% accuracy independently; he was able to name objects with 87% given a phonemic or sentence completion cue.Matching and identification completed verbally F02 with 90% accuracy. Pt required cueing to stay awake towards end of session. Cues required for visual attention.      Assessment / Recommendations / Plan   Plan Continue with current plan of care      Progression Toward Goals   Progression toward goals Progressing toward goals            SLP Education - 12/02/20 1350    Education Details Provided wife with "aphasia questionnaire" to get a better idea of functional targets.    Person(s) Educated Spouse;Patient    Methods Explanation;Handout    Comprehension Verbalized understanding            SLP Short Term Goals - 12/02/20 1352      SLP SHORT TERM GOAL #1   Title Pt will make a choice between items in given list of options provided with <2 verbal/visual cues.    Time 1    Period Weeks    Status On-going      SLP SHORT TERM GOAL #2   Title Pt  will swallow saliva prior to each communicative interaction given a <2 verbal/tactile cues.    Time 1    Period Weeks    Status On-going      SLP SHORT TERM GOAL #3   Title Pt will demonstrate sustained attention to task given a personally relevant task for >5 minutes.    Baseline <5 min    Time 1    Period Weeks    Status On-going            SLP Long Term Goals - 12/02/20 1352      SLP LONG TERM GOAL #1   Title Client will utilize compensatory strategies to communicate wants and needs effectively to different conversational partners and participate socially in functional living environment.    Time 7    Period Weeks    Status On-going      SLP LONG TERM GOAL #2   Title Client will demonstrate understanding of verbal  communication related to daily activities and utilize compensatory strategies to more effectively communicate in their functional living environment.    Time 7    Period Weeks    Status On-going      SLP LONG TERM GOAL #3   Title Patient will develop functional attention skills to effectively attend to and communicate in tasks of daily living in their functional living environment.    Time 7    Period Weeks    Status On-going            Plan - 12/02/20 1352    Clinical Impression Statement Pt is a 70 yo male who presents with severe cognitive-communication impairment and dysphagia 2/2 to cognitive impairment. Pt wife accompanied pt and required encouragement to participate in answering questions regarding impairments seen at home. Pt required verbal and tactile cueing to swallow saliva before speaking. With cueing before each communicative interaction, pt was able to participate in today's evaluation verbally. Pt experienced significantly delayed initation in answering questions and following directions. Pt wife reported her primary concern is with his lack of communication of his needs/wants. She reports mostly mumbling at home with occasional greetings. SLUMS was conducted this date to determine extent of cognitive deficit. Patient required repetition, visual, and verbal cueing to complete. Patient received a 4/30 on today's assessment; however, pt demonstrated increased accuracy with verbal cueing and when provided with multiple choice cue. SLP rec skilled speech services to increase patient's ability to communicate needs/wants and further participation in ADLs to relieve caregiver burden.    Speech Therapy Frequency 2x / week    Duration 12 weeks    Treatment/Interventions Aspiration precaution training;Cueing hierarchy;Patient/family education;Functional tasks;Environmental controls;Cognitive reorganization;Multimodal communcation approach;Language facilitation;Compensatory  techniques;Internal/external aids;SLP instruction and feedback;Compensatory strategies    Potential to Achieve Goals Fair    Potential Considerations Ability to learn/carryover information;Severity of impairments    Consulted and Agree with Plan of Care Patient;Family member/caregiver           Patient will benefit from skilled therapeutic intervention in order to improve the following deficits and impairments:   Cognitive communication deficit  Aphasia    Problem List Patient Active Problem List   Diagnosis Date Noted  . Benign essential HTN   . Cognitive deficit, post-stroke   . Dysphagia, post-stroke   . Controlled type 2 diabetes mellitus with hyperglycemia, without long-term current use of insulin ([place])   . 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 Dyslipidemia, goal LDL below 70 03/12/2019  . Dysphagia due  to recent stroke 03/12/2019  . Overweight 03/12/2019  . 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 Abnormality of gait   . Gait disturbance, post-stroke   . 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    [name], [place], [name]  12/02/2020, 1:54 PM  [facility]. [place], [place], [address] Phone: [phone]   Fax:  [phone]   Name: IDRIS EDMUNDSON Sr. MRN: [id number] Date of Birth: [birth year]

## 2020-12-05 NOTE — Progress Notes (Signed)
Subjective:    Patient ID: Joe Eddy Sr., male    DOB: [birth year], 70 y.o.   MRN: [id number]  09/03/19 This is a 70 year old male here for post hospital follow-up and a face-to-face exam.  His last visit was a telephone visit in November with one of our APP's.  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  Patient also has type 2 diabetes on Metformin, dyslipidemia on atorvastatin with a goal of LDL less than 70, hypertension on amlodipine 5 mg daily, and urinary incontinence now using diapering supplies.  Note the patient has been seen by neurology and physical medicine and rehab however neither service had yet ordered any outpatient speech physical or occupational therapy.  He was seen briefly by home-based therapy with [facility].  He is here to establish further for primary care so that we can follow through on these needs.  Also the patient spouse complains that he sleeps a lot during the daytime but upon further review he is taking his Seroquel multiple times during the day instead of at bedtime therefore he is not sleeping at night and is sleeping during the day.  Note the patient is on Metformin and his blood glucoses have been well controlled  Blood pressures also been well controlled and today in the office blood pressure is 125/77  08/03/2020 Chronic cough 4 weeks This patient comes in today to establish for primary care as his other primary care physician has left the practice.  I did seen this patient previously earlier in the year.  History of hypertension which has been well controlled and on arrival he is 120/79.  Previous stroke involving right posterior communicating artery with thrombectomy removal of large clot and now with residual deficits of hemiparesis on the right upper extremity and significant dysphagia and trouble swallowing.  He had been seeing  speech pathology and they are requiring another modified barium swallow for this patient.  He is following with a nectar thick liquids and having his spouse watch him eat however she states he still coughs quite a bit when eating and has been to the emergency room several times for this.  Also the emergency room recently for bladder irritation found to have a E. coli UTI and was given a course of antibiotics.  Patient maintains his aspirin atorvastatin and Aricept but is now off Brilinta.  Patient is taking the Seroquel at bedtime this is cause less drowsiness during the daytime Patient also has diabetes is only taking 250 mg of Metformin daily on arrival blood sugar is 223 and hemoglobin A1c has increased to 6.7 on this visit  10/04/20 Patient is seen in return follow-up accompanied by his spouse who is his caregiver.  Spouse expresses frustration and she is having increased difficulty caring for this patient.  He does have a chronic cough and drooling from his previous stroke he cannot swallow he cannot talk at this time.  The patient does not had no falls at this time.  He has been compliant with all his medications.  On arrival blood pressure is 145/84 oxygen level is adequate blood glucose is 153  Patient has no other specific complaints other than the chronic cough at this time.  He was to be referred to speech pathology however the consult has not yet occurred Note due to his medical status he is not a candidate for colonoscopy and Pneumovax is not indicated  at this time  The patient's spouse is indicating a desire to look into long-term care options  12/06/2020 Patient seen in return follow-up doing fairly well.  Has seen physical therapy and speech therapy and is improving with swallowing speech and also muscle strength.  He is now using a walker instead of a wheelchair. On arrival blood pressure is 130/78.  His blood sugar was 58 he has been taking Metformin 1000 mg twice daily A1c is  5.6   Past Medical History:  Diagnosis Date  . 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 ([place]) 06/03/2019  . Cerebrovascular accident (CVA) due to occlusion of vertebral artery ([place])   . Diabetes mellitus without complication ([place])   . Dysphagia    post stroke  . Hypertension   . Stroke [facility])      Family History  Problem Relation Age of Onset  . Diabetes Other   . Hypertension Other   . Healthy Mother   . Healthy Father      Social History   Socioeconomic History  . Marital status: Married    Spouse name: [name]  . Number of children: Not on file  . Years of education: Not on file  . Highest education level: Not on file  Occupational History  . Not on file  Tobacco Use  . Smoking status: Never Smoker  . Smokeless tobacco: Never Used  Vaping Use  . Vaping Use: Never used  Substance and Sexual Activity  . Alcohol use: No  . Drug use: No  . Sexual activity: Not Currently  Other Topics Concern  . Not on file  Social History Narrative   12/16/19 Lives with wife   Social Determinants of Health   Financial Resource Strain: Not on file  Food Insecurity: Not on file  Transportation Needs: Not on file  Physical Activity: Not on file  Stress: Not on file  Social Connections: Not on file  Intimate Partner Violence: Not on file     No Known Allergies   Outpatient Medications Prior to Visit  Medication Sig Dispense Refill  . acetaminophen (TYLENOL) 325 MG tablet Take 2 tablets (650 mg total) by mouth every 4 (four) hours as needed for mild pain (or temp > 37.5 C (99.5 F)).    Marland Kitchen amLODipine (NORVASC) 5 MG tablet Take 1 tablet (5 mg total) by mouth daily. To lower blood pressure 90 tablet 2  . aspirin EC 81 MG tablet Take 1 tablet (81 mg total) by mouth daily. 100 tablet 3  . atorvastatin (LIPITOR) 40 MG tablet One daily in the evening to lower cholesterol 90 tablet 2  . donepezil (ARICEPT) 10 MG tablet Take 1 tablet (10 mg  total) by mouth at bedtime. 90 tablet 0  . QUEtiapine (SEROQUEL) 25 MG tablet Take 2 tablets (50 mg total) by mouth at bedtime. 60 tablet 3  . metFORMIN (GLUCOPHAGE) 500 MG tablet Take 2 tablets (1,000 mg total) by mouth 2 (two) times daily with a meal. 120 tablet 1   No facility-administered medications prior to visit.      Review of Systems Constitutional:   No  weight loss, night sweats,  Fevers, chills, fatigue, lassitude. HEENT:    headaches,  Tooth/dental problems,  Sore throat,                No sneezing, itching, ear ache, nasal congestion, post nasal drip,   CV:  No chest pain,  Orthopnea, PND, swelling in lower extremities, anasarca,  dizziness, palpitations  GI  No heartburn, indigestion, abdominal pain, nausea, vomiting, diarrhea, change in bowel habits, loss of appetite  Resp: No shortness of breath with exertion or at rest.  No excess mucus, no productive cough,   No cough,  No coughing up of blood.  No change in color of mucus.  No wheezing.  No chest wall deformity  Skin: no rash or lesions.  GU: no dysuria, change in color of urine, no urgency or frequency.  No flank pain.  MS:  No joint pain or swelling.  No decreased range of motion.  No back pain.  Psych:   change in mood or affect.  depression or anxiety.   memory loss.     Objective:   Physical Exam Vitals:   12/06/20 1410 12/06/20 1432  BP: (!) 157/83 131/78  Pulse: 71   SpO2: 99%   Weight: 173 lb 9.6 oz (78.7 kg)   Height: 6' (1.829 m)     PJA:SNKNL and alert  ENT: No lesions,  mouth clear,  oropharynx clear, no postnasal drip  Neck: No JVD, no TMG, no carotid bruits  Lungs: No use of accessory muscles, no dullness to percussion, clear  Cardiovascular: RRR, heart sounds normal, no murmur or gallops, no peripheral edema  Abdomen: soft and NT, no HSM,  BS normal  Musculoskeletal: No deformities, no cyanosis or clubbing  Neuro:awake and alert   aphasic   Some drooling, difficult protruding  tongue still persists  Skin: Warm, no lesions or rashes  Ct Head Code Stroke Wo Contrast 03/10/2019 1454 1. No acute finding by CT. Extensive chronic small-vessel ischemic changes throughout the brain as outlined above. Old right frontal infarction. 2. ASPECTS is 10.   Ct Angio Head W Or Wo Contrast Ct Angio Neck W Or Wo Contrast Ct Cerebral Perfusion W Contrast 03/10/2019 1517 1. Fetal type right PCA with proximal occlusion leading to 19 cc of penumbra and no infarct by CT perfusion. There may be a right P1 segment that is also occluded. 2. 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 3. Subjectively advanced left cavernous ICA stenosis. 4. 30-40% atheromatous narrowing at the right ICA bulb.   CT angio/ IR Percutaneous Art Thrombectomy 03/10/2019 S/P 4 vessel cerebral arteriogram followed by completev Revascularization Of RT PCOM and RT PCA with x 1 pass with 58mm x 38mm embotrap retriver device achieving a TICI 2b revascularization. 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 Motion degraded study. 2. Ischemic infarct of the right posterior cerebral artery territory, roughly corresponding to the ischemic volume identified on the earlier CT perfusion scan. 3. No hemorrhage or mass effect. 4. Chronic ischemic microangiopathy and age advanced atrophy.  CT Head WO Contrast 8/72020 Evolving infarct in the right PCA territory. No progression since prior studies. No acute hemorrhage.Atrophy and extensive chronic vessel ischemia.  MRI 05/02/2019 1. Resolving diffusion restriction in the right PCA territory, with patchy residual in the right splenium. Petechial hemorrhage and post ischemic enhancement in the right thalamus and right occipital lobe. No mass effect.  2. Underlying severe chronic ischemic  disease with superimposed new small acute to subacute infarcts in the:  - left basal ganglia. - right cerebellar peduncle. 3. 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  2D Echocardiogram 1. The left ventricle has normal  systolic function with an ejection fraction of 60-65%. The cavity size was normal. Left ventricular diastolic Doppler parameters are consistent with impaired relaxation. 2. The right ventricle has normal systolic function. The cavity was normal. There is no increase in right ventricular wall thickness. 3. Left atrial size was mildly dilated. 4. Right atrial size was mildly dilated. 5. The aortic root and ascending aorta are normal in size and structure. 6. The interatrial septum was not assessed.  CXR  05/28/2019 1. No radiographic evidence of acute cardiopulmonary disease. 2. Aortic atherosclerosis.        Assessment & Plan:  I personally reviewed all images and lab data in the [facility] system as well as any outside material available during this office visit and agree with the  radiology impressions.   Benign essential HTN Blood sugar under good control no change in medications  Dysphagia due to recent stroke Improved with speech therapy  Controlled type 2 diabetes mellitus with hyperglycemia, without long-term current use of insulin ([name]) At goal reduce Metformin to 500 mg twice daily  Left hemiparesis [facility]) Patient ambulatory with walker now continue physical therapy  Dyslipidemia, goal LDL below 70 At goal continue atorvastatin   Agostino was seen today for diabetes.  Diagnoses and all orders for this visit:  Controlled type 2 diabetes mellitus with hyperglycemia, without long-term current use of insulin ([name]) -     Comprehensive metabolic panel -     HgB T7R -     Glucose (CBG)  Benign essential HTN -     CBC with Differential/Platelet -     Comprehensive metabolic panel  Dyslipidemia, goal LDL below 70 -      Comprehensive metabolic panel  Left hemiparesis ([name])  Vestibular schwannoma ([name])  Type 2 diabetes mellitus with other circulatory complication, without long-term current use of insulin ([name]) -     metFORMIN (GLUCOPHAGE) 500 MG tablet; Take 1 tablet (500 mg total) by mouth 2 (two) times daily with a meal.  Dysphagia due to recent stroke   The patient is fully vaccinated for COVID

## 2020-12-06 ENCOUNTER — Encounter: Payer: Self-pay | Admitting: Critical Care Medicine

## 2020-12-06 ENCOUNTER — Other Ambulatory Visit: Payer: Self-pay

## 2020-12-06 ENCOUNTER — Ambulatory Visit: Payer: Medicare Other | Attending: Critical Care Medicine | Admitting: Critical Care Medicine

## 2020-12-06 VITALS — BP 131/78 | HR 71 | Ht 72.0 in | Wt 173.6 lb

## 2020-12-06 DIAGNOSIS — D333 Benign neoplasm of cranial nerves: Secondary | ICD-10-CM

## 2020-12-06 DIAGNOSIS — E1159 Type 2 diabetes mellitus with other circulatory complications: Secondary | ICD-10-CM | POA: Diagnosis not present

## 2020-12-06 DIAGNOSIS — I10 Essential (primary) hypertension: Secondary | ICD-10-CM | POA: Diagnosis not present

## 2020-12-06 DIAGNOSIS — G8194 Hemiplegia, unspecified affecting left nondominant side: Secondary | ICD-10-CM | POA: Diagnosis not present

## 2020-12-06 DIAGNOSIS — E1165 Type 2 diabetes mellitus with hyperglycemia: Secondary | ICD-10-CM

## 2020-12-06 DIAGNOSIS — I69391 Dysphagia following cerebral infarction: Secondary | ICD-10-CM | POA: Diagnosis not present

## 2020-12-06 DIAGNOSIS — E785 Hyperlipidemia, unspecified: Secondary | ICD-10-CM | POA: Diagnosis not present

## 2020-12-06 LAB — GLUCOSE, POCT (MANUAL RESULT ENTRY): POC Glucose: 58 mg/dl — AB (ref 70–99)

## 2020-12-06 LAB — POCT GLYCOSYLATED HEMOGLOBIN (HGB A1C): HbA1c, POC (controlled diabetic range): 5.6 % (ref 0.0–7.0)

## 2020-12-06 MED ORDER — METFORMIN HCL 500 MG PO TABS: 500.0000 mg | ORAL_TABLET | Freq: Two times a day (BID) | ORAL | 1 refills | Status: DC

## 2020-12-06 NOTE — Assessment & Plan Note (Signed)
Improved with speech therapy. 

## 2020-12-06 NOTE — Assessment & Plan Note (Signed)
Patient ambulatory with walker now continue physical therapy

## 2020-12-06 NOTE — Assessment & Plan Note (Signed)
At goal continue atorvastatin

## 2020-12-06 NOTE — Assessment & Plan Note (Signed)
At goal reduce Metformin to 500 mg twice daily

## 2020-12-06 NOTE — Assessment & Plan Note (Signed)
Blood sugar under good control no change in medications

## 2020-12-06 NOTE — Patient Instructions (Signed)
Reduce Metformin to 1 pill twice daily with meals  No other medication changes  A letter was produced that you can give to your sons to assist their travel from [place] to the [place] to visit their father  Continue with therapy with speech and physical therapy as an outpatient  Return Dr Joya Gaskins 3 months

## 2020-12-07 ENCOUNTER — Ambulatory Visit: Payer: Medicare Other | Attending: Critical Care Medicine | Admitting: Speech Pathology

## 2020-12-07 ENCOUNTER — Encounter: Payer: Self-pay | Admitting: Speech Pathology

## 2020-12-07 DIAGNOSIS — R4701 Aphasia: Secondary | ICD-10-CM | POA: Diagnosis not present

## 2020-12-07 DIAGNOSIS — R2681 Unsteadiness on feet: Secondary | ICD-10-CM | POA: Diagnosis not present

## 2020-12-07 DIAGNOSIS — M6281 Muscle weakness (generalized): Secondary | ICD-10-CM | POA: Diagnosis not present

## 2020-12-07 DIAGNOSIS — I6329 Cerebral infarction due to unspecified occlusion or stenosis of other precerebral arteries: Secondary | ICD-10-CM | POA: Diagnosis not present

## 2020-12-07 DIAGNOSIS — R41841 Cognitive communication deficit: Secondary | ICD-10-CM | POA: Diagnosis present

## 2020-12-07 DIAGNOSIS — R293 Abnormal posture: Secondary | ICD-10-CM | POA: Insufficient documentation

## 2020-12-07 DIAGNOSIS — R260 Ataxic gait: Secondary | ICD-10-CM | POA: Insufficient documentation

## 2020-12-07 DIAGNOSIS — Z9181 History of falling: Secondary | ICD-10-CM | POA: Insufficient documentation

## 2020-12-07 DIAGNOSIS — R2689 Other abnormalities of gait and mobility: Secondary | ICD-10-CM | POA: Diagnosis not present

## 2020-12-07 LAB — CBC WITH DIFFERENTIAL/PLATELET
Basophils Absolute: 0.1 10*3/uL (ref 0.0–0.2)
Basos: 1 %
EOS (ABSOLUTE): 0.2 10*3/uL (ref 0.0–0.4)
Eos: 3 %
Hematocrit: 38.3 % (ref 37.5–51.0)
Hemoglobin: 12.9 g/dL — ABNORMAL LOW (ref 13.0–17.7)
Immature Grans (Abs): 0 10*3/uL (ref 0.0–0.1)
Immature Granulocytes: 0 %
Lymphocytes Absolute: 3.1 10*3/uL (ref 0.7–3.1)
Lymphs: 49 %
MCH: 30.9 pg (ref 26.6–33.0)
MCHC: 33.7 g/dL (ref 31.5–35.7)
MCV: 92 fL (ref 79–97)
Monocytes Absolute: 0.5 10*3/uL (ref 0.1–0.9)
Monocytes: 8 %
Neutrophils Absolute: 2.5 10*3/uL (ref 1.4–7.0)
Neutrophils: 39 %
Platelets: 254 10*3/uL (ref 150–450)
RBC: 4.18 x10E6/uL (ref 4.14–5.80)
RDW: 12.2 % (ref 11.6–15.4)
WBC: 6.4 10*3/uL (ref 3.4–10.8)

## 2020-12-07 LAB — COMPREHENSIVE METABOLIC PANEL
ALT: 14 IU/L (ref 0–44)
AST: 17 IU/L (ref 0–40)
Albumin/Globulin Ratio: 1.4 (ref 1.2–2.2)
Albumin: 4.3 g/dL (ref 3.8–4.8)
Alkaline Phosphatase: 72 IU/L (ref 44–121)
BUN/Creatinine Ratio: 22 (ref 10–24)
BUN: 16 mg/dL (ref 8–27)
Bilirubin Total: 0.5 mg/dL (ref 0.0–1.2)
CO2: 24 mmol/L (ref 20–29)
Calcium: 9.9 mg/dL (ref 8.6–10.2)
Chloride: 101 mmol/L (ref 96–106)
Creatinine, Ser: 0.73 mg/dL — ABNORMAL LOW (ref 0.76–1.27)
Globulin, Total: 3 g/dL (ref 1.5–4.5)
Glucose: 75 mg/dL (ref 65–99)
Potassium: 4.8 mmol/L (ref 3.5–5.2)
Sodium: 141 mmol/L (ref 134–144)
Total Protein: 7.3 g/dL (ref 6.0–8.5)
eGFR: 100 mL/min/{1.73_m2} (ref >59)

## 2020-12-07 NOTE — Therapy (Signed)
[place] [place], [place], [address] Phone: [phone]   Fax:  [phone]  Speech Language Pathology Treatment  Patient Details  Name: Joe SENSKE Sr. MRN: [id number] Date of Birth: Apr 05, 1951 Referring Provider (SLP): Elsie Stain, MD   Encounter Date: 12/07/2020   End of Session - 12/07/20 1408    Visit Number 13    Number of Visits 25    Date for SLP Re-Evaluation 01/22/21    SLP Start Time 48    SLP Stop Time  1442    SLP Time Calculation (min) 40 min    Activity Tolerance Patient tolerated treatment well;Patient limited by fatigue           Past Medical History:  Diagnosis Date  . 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 ([place]) 06/03/2019  . Cerebrovascular accident (CVA) due to occlusion of vertebral artery ([place])   . Diabetes mellitus without complication ([place])   . Dysphagia    post stroke  . Hypertension   . Stroke [facility])     Past Surgical History:  Procedure Laterality Date  . IR ANGIO INTRA EXTRACRAN SEL COM CAROTID INNOMINATE UNI L MOD SED  03/10/2019  . IR ANGIO VERTEBRAL SEL SUBCLAVIAN INNOMINATE BILAT MOD SED  03/10/2019  . IR CT HEAD LTD  03/10/2019  . IR PERCUTANEOUS ART THROMBECTOMY/INFUSION INTRACRANIAL INC DIAG ANGIO  03/10/2019  . RADIOLOGY WITH ANESTHESIA N/A 03/10/2019   Procedure: IR WITH ANESTHESIA/CODE STROKE;  Surgeon: Radiologist, Medication, MD;  Location: [place];  Service: Radiology;  Laterality: N/A;    There were no vitals filed for this visit.   Subjective Assessment - 12/07/20 1407    Subjective Pt was seen at the doctor yesterday. Reports no changes.    Currently in Pain? No/denies                 ADULT SLP TREATMENT - 12/07/20 1433      General Information   Behavior/Cognition Alert;Cooperative;Pleasant mood;Distractible;Decreased sustained attention      Treatment Provided   Treatment provided  Cognitive-Linquistic      Cognitive-Linquistic Treatment   Treatment focused on Cognition;Aphasia    Skilled Treatment Patient required increased "brain breaks" this session due to fatigue. He was able to sustain attention on a single task for 15-20 minutes without closing his eyes. Pt required mod-maxA with naming fruits. He benefited from a choice of two, semantically similar items during this tasks. He continues to require visual and verbal cueing to assist patient with naming items. Pt has met goal for swallowing saliva independently prior to speaking with SLP in the treatment session. Wife reports that she does not see much carryover at home. Will continue to modify treatment to assist with errorless learning.      Assessment / Recommendations / Plan   Plan Continue with current plan of care      Progression Toward Goals   Progression toward goals Progressing toward goals            SLP Education - 12/07/20 1450    Education Details Provided edu to wife on the importance of excepting only verbal responses at home to assist with initiation/carryover/awareness of saliva in mouth. Also educated on continuing to provide choices at home vs. open ended questons.    Person(s) Educated Patient;Spouse    Methods Explanation;Demonstration    Comprehension Verbalized understanding            SLP  Short Term Goals - 12/07/20 1438      SLP SHORT TERM GOAL #1   Title Pt will make a choice between items in given list of options provided with <2 verbal/visual cues.    Period Weeks    Status Achieved   Pt is able to make a choice verbally, but may not correctly identify.     SLP SHORT TERM GOAL #2   Title Pt will swallow saliva prior to each communicative interaction given a <2 verbal/tactile cues.    Time 1    Period Weeks    Status Achieved      SLP SHORT TERM GOAL #3   Title Pt will demonstrate sustained attention to task given a personally relevant task for >5 minutes.    Baseline <5  min    Time 1    Period Weeks    Status Achieved            SLP Long Term Goals - 12/07/20 1409      SLP LONG TERM GOAL #1   Title Client will utilize compensatory strategies to communicate wants and needs effectively to different conversational partners and participate socially in functional living environment.    Time 6    Period Weeks    Status On-going      SLP LONG TERM GOAL #2   Title Client will demonstrate understanding of verbal communication related to daily activities and utilize compensatory strategies to more effectively communicate in their functional living environment.    Time 6    Period Weeks    Status On-going      SLP LONG TERM GOAL #3   Title Patient will develop functional attention skills to effectively attend to and communicate in tasks of daily living in their functional living environment.    Time 6    Period Weeks    Status On-going            Plan - 12/07/20 1408    Clinical Impression Statement Pt is a 70 yo male who presents with severe cognitive-communication impairment and dysphagia 2/2 to cognitive impairment. Pt wife accompanied pt and required encouragement to participate in answering questions regarding impairments seen at home. Pt required verbal and tactile cueing to swallow saliva before speaking. With cueing before each communicative interaction, pt was able to participate in today's evaluation verbally. Pt experienced significantly delayed initation in answering questions and following directions. Pt wife reported her primary concern is with his lack of communication of his needs/wants. She reports mostly mumbling at home with occasional greetings. SLUMS was conducted this date to determine extent of cognitive deficit. Patient required repetition, visual, and verbal cueing to complete. Patient received a 4/30 on today's assessment; however, pt demonstrated increased accuracy with verbal cueing and when provided with multiple choice cue. SLP  rec skilled speech services to increase patient's ability to communicate needs/wants and further participation in ADLs to relieve caregiver burden.    Speech Therapy Frequency 2x / week    Duration 12 weeks    Treatment/Interventions Aspiration precaution training;Cueing hierarchy;Patient/family education;Functional tasks;Environmental controls;Cognitive reorganization;Multimodal communcation approach;Language facilitation;Compensatory techniques;Internal/external aids;SLP instruction and feedback;Compensatory strategies    Potential to Achieve Goals Fair    Potential Considerations Ability to learn/carryover information;Severity of impairments    Consulted and Agree with Plan of Care Patient;Family member/caregiver           Patient will benefit from skilled therapeutic intervention in order to improve the following deficits and impairments:   Cognitive communication  deficit  Aphasia    Problem List Patient Active Problem List   Diagnosis Date Noted  . Left hemiparesis ([place]) 12/06/2020  . Benign essential HTN   . Cognitive deficit, post-stroke   . Controlled type 2 diabetes mellitus with hyperglycemia, without long-term current use of insulin ([place])   . Vestibular schwannoma ([place]) 06/03/2019  . 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 Dyslipidemia, goal LDL below 70 03/12/2019  . Dysphagia due to recent stroke 03/12/2019  . 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 Abnormality of gait   . Gait disturbance, post-stroke   . 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    [name], [place], [name]  12/07/2020, 2:51 PM  [place]. [place], [place], [address] Phone: [phone]   Fax:  [phone]   Name: Joe HEBER Sr. MRN: [id number] Date of Birth: December 09, 1950

## 2020-12-09 ENCOUNTER — Encounter: Payer: Self-pay | Admitting: Speech Pathology

## 2020-12-09 ENCOUNTER — Other Ambulatory Visit: Payer: Self-pay

## 2020-12-09 ENCOUNTER — Ambulatory Visit: Payer: Medicare Other | Admitting: Speech Pathology

## 2020-12-09 ENCOUNTER — Ambulatory Visit: Payer: Medicare Other

## 2020-12-09 DIAGNOSIS — R41841 Cognitive communication deficit: Secondary | ICD-10-CM | POA: Diagnosis not present

## 2020-12-09 DIAGNOSIS — I6329 Cerebral infarction due to unspecified occlusion or stenosis of other precerebral arteries: Secondary | ICD-10-CM | POA: Diagnosis not present

## 2020-12-09 DIAGNOSIS — Z9181 History of falling: Secondary | ICD-10-CM

## 2020-12-09 DIAGNOSIS — R260 Ataxic gait: Secondary | ICD-10-CM

## 2020-12-09 DIAGNOSIS — R4701 Aphasia: Secondary | ICD-10-CM | POA: Diagnosis not present

## 2020-12-09 DIAGNOSIS — M6281 Muscle weakness (generalized): Secondary | ICD-10-CM

## 2020-12-09 DIAGNOSIS — R2689 Other abnormalities of gait and mobility: Secondary | ICD-10-CM | POA: Diagnosis not present

## 2020-12-09 DIAGNOSIS — R293 Abnormal posture: Secondary | ICD-10-CM

## 2020-12-09 DIAGNOSIS — R2681 Unsteadiness on feet: Secondary | ICD-10-CM | POA: Diagnosis not present

## 2020-12-09 NOTE — Therapy (Signed)
[place]. [place], [place], [address] Phone: [phone]   Fax:  [phone]  Speech Language Pathology Treatment  Patient Details  Name: Joe HUBERTY Sr. MRN: [id number] Date of Birth: Sep 12, 1950 Referring Provider (SLP): Elsie Stain, MD   Encounter Date: 12/09/2020   End of Session - 12/09/20 1536    Visit Number 14    Number of Visits 25    Date for SLP Re-Evaluation 01/22/21    SLP Start Time 1532    SLP Stop Time  1615    SLP Time Calculation (min) 43 min    Activity Tolerance Patient tolerated treatment well;Patient limited by fatigue           Past Medical History:  Diagnosis Date  . 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 ([place]) 06/03/2019  . Cerebrovascular accident (CVA) due to occlusion of vertebral artery ([place])   . Diabetes mellitus without complication ([place])   . Dysphagia    post stroke  . Hypertension   . Stroke [facility])     Past Surgical History:  Procedure Laterality Date  . IR ANGIO INTRA EXTRACRAN SEL COM CAROTID INNOMINATE UNI L MOD SED  03/10/2019  . IR ANGIO VERTEBRAL SEL SUBCLAVIAN INNOMINATE BILAT MOD SED  03/10/2019  . IR CT HEAD LTD  03/10/2019  . IR PERCUTANEOUS ART THROMBECTOMY/INFUSION INTRACRANIAL INC DIAG ANGIO  03/10/2019  . RADIOLOGY WITH ANESTHESIA N/A 03/10/2019   Procedure: IR WITH ANESTHESIA/CODE STROKE;  Surgeon: Radiologist, Medication, MD;  Location: [facility];  Service: Radiology;  Laterality: N/A;    There were no vitals filed for this visit.   Subjective Assessment - 12/09/20 1536    Subjective Pt reported he feels okay today. Pt accidentally arrived early for sessions today.    Currently in Pain? No/denies                 ADULT SLP TREATMENT - 12/09/20 1613      General Information   Behavior/Cognition Alert;Cooperative;Pleasant mood;Distractible;Decreased sustained attention      Treatment Provided    Treatment provided Cognitive-Linquistic      Cognitive-Linquistic Treatment   Treatment focused on Cognition;Aphasia    Skilled Treatment Patient had difficulty answering yes/no questions given with and without a given visual cue. To be a written as a short term goal. Continuing to assist with vocab to increase ability to make correct choices at home when provided. Will continue to train and assist pt with strategies for word finding to communicative interactions outside the treatment room.      Assessment / Recommendations / Plan   Plan Continue with current plan of care      Progression Toward Goals   Progression toward goals Progressing toward goals              SLP Short Term Goals - 12/09/20 1537      SLP SHORT TERM GOAL #1   Title Pt will verbalize yes/no when asked a question to increase verbal communication.    Time 4    Period Weeks    Status New   Pt is able to make a choice verbally, but may not correctly identify.     SLP SHORT TERM GOAL #2   Title Pt will follow through with swallowing saliva before speaking at home as reported by caregiver.    Baseline Swallows with rare cues in session.    Time 4    Period Weeks  Status New      SLP SHORT TERM GOAL #3   Title Pt will name 6/10 daily objects given phonemic cues to assist with making choices at home.    Baseline 3    Time 4    Period Weeks    Status New            SLP Long Term Goals - 12/09/20 1538      SLP LONG TERM GOAL #1   Title Client will utilize compensatory strategies to communicate wants and needs effectively to different conversational partners and participate socially in functional living environment.    Time 6    Period Weeks    Status On-going      SLP LONG TERM GOAL #2   Title Client will demonstrate understanding of verbal communication related to daily activities and utilize compensatory strategies to more effectively communicate in their functional living environment.    Time 6     Period Weeks    Status On-going      SLP LONG TERM GOAL #3   Title Patient will develop functional attention skills to effectively attend to and communicate in tasks of daily living in their functional living environment.    Time 6    Period Weeks    Status On-going            Plan - 12/09/20 1537    Clinical Impression Statement Pt is a 70 yo male who presents with severe cognitive-communication impairment and dysphagia 2/2 to cognitive impairment. Pt wife accompanied pt and required encouragement to participate in answering questions regarding impairments seen at home. Pt required verbal and tactile cueing to swallow saliva before speaking. With cueing before each communicative interaction, pt was able to participate in today's evaluation verbally. Pt experienced significantly delayed initation in answering questions and following directions. Pt wife reported her primary concern is with his lack of communication of his needs/wants. She reports mostly mumbling at home with occasional greetings. SLUMS was conducted this date to determine extent of cognitive deficit. Patient required repetition, visual, and verbal cueing to complete. Patient received a 4/30 on today's assessment; however, pt demonstrated increased accuracy with verbal cueing and when provided with multiple choice cue. SLP rec skilled speech services to increase patient's ability to communicate needs/wants and further participation in ADLs to relieve caregiver burden.    Speech Therapy Frequency 2x / week    Duration 12 weeks    Treatment/Interventions Aspiration precaution training;Cueing hierarchy;Patient/family education;Functional tasks;Environmental controls;Cognitive reorganization;Multimodal communcation approach;Language facilitation;Compensatory techniques;Internal/external aids;SLP instruction and feedback;Compensatory strategies    Potential to Achieve Goals Fair    Potential Considerations Ability to learn/carryover  information;Severity of impairments    Consulted and Agree with Plan of Care Patient;Family member/caregiver          Provided HEP to wife.   Patient will benefit from skilled therapeutic intervention in order to improve the following deficits and impairments:   Aphasia  Cognitive communication deficit    Problem List Patient Active Problem List   Diagnosis Date Noted  . Left hemiparesis ([place]) 12/06/2020  . Benign essential HTN   . Cognitive deficit, post-stroke   . Controlled type 2 diabetes mellitus with hyperglycemia, without long-term current use of insulin ([place])   . 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 Dyslipidemia, goal LDL below 70 03/12/2019  . Dysphagia due to recent stroke 03/12/2019  . 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 Abnormality of gait   . Gait disturbance, post-stroke   . 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    [name], [place], [name]  12/09/2020, 4:56 PM  [place]. [place], [place], [address] Phone: [phone]   Fax:  [phone]   Name: Joe MEHRA Sr. MRN: [id number] Date of Birth: Sep 07, 1950

## 2020-12-09 NOTE — Therapy (Signed)
[place]. [place], [place], [address] Phone: [phone]   Fax:  [phone]  Physical Therapy Treatment  Patient Details  Name: Joe Perry Sr. MRN: [id number] Date of Birth: 18-Mar-1951 Referring Provider (PT): Asencion Noble, MD   Encounter Date: 12/09/2020   PT End of Session - 12/09/20 1601    Visit Number 6    Number of Visits 17    Date for PT Re-Evaluation 01/06/21    Authorization Type [employer] Medicare    PT Start Time 0762    PT Stop Time 1533    PT Time Calculation (min) 41 min    Activity Tolerance Patient tolerated treatment well;Patient limited by fatigue    Behavior During Therapy Flat affect           Past Medical History:  Diagnosis Date  . 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 ([place]) 06/03/2019  . Cerebrovascular accident (CVA) due to occlusion of vertebral artery ([place])   . Diabetes mellitus without complication ([place])   . Dysphagia    post stroke  . Hypertension   . Stroke [facility])     Past Surgical History:  Procedure Laterality Date  . IR ANGIO INTRA EXTRACRAN SEL COM CAROTID INNOMINATE UNI L MOD SED  03/10/2019  . IR ANGIO VERTEBRAL SEL SUBCLAVIAN INNOMINATE BILAT MOD SED  03/10/2019  . IR CT HEAD LTD  03/10/2019  . IR PERCUTANEOUS ART THROMBECTOMY/INFUSION INTRACRANIAL INC DIAG ANGIO  03/10/2019  . RADIOLOGY WITH ANESTHESIA N/A 03/10/2019   Procedure: IR WITH ANESTHESIA/CODE STROKE;  Surgeon: Radiologist, Medication, MD;  Location: [facility];  Service: Radiology;  Laterality: N/A;    There were no vitals filed for this visit.   Subjective Assessment - 12/09/20 1600    Subjective Wife reports he doesnt want to do exercises at home and frequently looks down and slouches especially when sitting. Also though she noticed some swelling in his legs yesterday    Patient is accompained by: Family member    Limitations Walking    Patient Stated Goals  Pt's goals for therapy are to walk better. (pt agrees with wife's statement)    Currently in Pain? No/denies               [facility] Adult PT Treatment/Exercise - 12/09/20 0001      High Level Balance   High Level Balance Comments forward and backward step taking with FWW max cues for walker advancement, upright gaze      Therapeutic Activites    Other Therapeutic Activities CGA/MIN A provided to ambulate 100 ft with RW, max verbal cues for upward gaze and upright posture, RW proximity (too close at times, increased step lengths and heights - some foot drag left with fatigue).  Multidirecitonal reach and place cones 2x10 B.  Towel slides/hand slides on wall for upright posture 10 x 2      Knee/Hip Exercises: Aerobic   Nustep L2 x 6 minutes      Knee/Hip Exercises: Standing   Other Standing Knee Exercises Marches at walker to tap orange cones B x10 alternating      Knee/Hip Exercises: Seated   Long Arc Quad Both;10 reps;1 set    [employer] Weight 2 lbs.    Marching Both;2 sets;10 reps   tapping cone target in sitting   Sit to Sand 2 sets;10 reps;with UE support   tactile cues for upright posture  PT Short Term Goals - 11/11/20 1445      PT SHORT TERM GOAL #1   Title Pt will perform HEP with wife's assistance, for improved strength, balance, gait.    Time 4    Period Weeks    Status New    Target Date 12/09/20             PT Long Term Goals - 11/11/20 1446      PT LONG TERM GOAL #1   Title Pt will perform progressive HEP with family supervision and assistance for improved balance, strength, gait.    Time 8    Period Weeks    Status New    Target Date 01/06/21      PT LONG TERM GOAL #2   Title Pt will improve 5TSTS score to </= 20 seconds to demonstrate improved functional strength and decreased fall risk    Time 8    Period Weeks    Status New    Target Date 01/06/21      PT LONG TERM GOAL #3   Title Pt will improve TUG score to  less than or equal to 25 seconds for decreased fall risk.    Time 8    Period Weeks    Status New    Target Date 01/06/21                 Plan - 12/09/20 1602    Clinical Impression Statement Pt tolerated all exercises well today. Did very nicely with wall towel slides with sustained upright posture but minimal carryover. WIll continue to benefit from strength and balance training especially to work on stance balance and foot clearnace on the left > right. He demonstrated improved carryover of hand placement with transfers sit <> stand and improved seuencing of turning with walker to sit on a seat behind him    Personal Factors and Comorbidities Comorbidity 3+    Comorbidities DM, HTN, CVA, dsyphagia    Examination-Activity Limitations Locomotion Level;Transfers;Stand;Stairs    Examination-Participation Restrictions Community Activity;Other    Rehab Potential Fair   cognitive impairments, decreased safety awareness, severity of deficits   PT Frequency 2x / week    PT Duration 8 weeks    PT Treatment/Interventions ADLs/Self Care Home Management;DME Instruction;Neuromuscular re-education;Balance training;Therapeutic exercise;Therapeutic activities;Functional mobility training;Stair training;Gait training;Patient/family education;Energy conservation;Manual techniques    PT Next Visit Plan Transfer training, gait training; wife education for safety with transfers and gait, consider QP positioning at some point progressing to tall kneeling and potentially 1/2 kneel with UE assist; postural upright, hard airex for pelvic neutral.  Can trial next visit with small wedge or towel roll under the left to see if we can kick in more trunk righting reactions and active WS left    PT Home Exercise Plan Educated wife in verbal cues for tall upright posture, use of horizontal sacral towel roll to promote WB through ischial tuberosities and improve pelvic alignnet and posture in sitting    Consulted and  Agree with Plan of Care Patient;Family member/caregiver    Family Member Consulted wife           Patient will benefit from skilled therapeutic intervention in order to improve the following deficits and impairments:  Abnormal gait,Decreased coordination,Difficulty walking,Decreased safety awareness,Decreased balance,Decreased strength,Decreased mobility  Visit Diagnosis: Muscle weakness (generalized)  Ataxic gait  Abnormal posture  Other abnormalities of gait and mobility  History of falling  Unsteadiness on feet  Cerebrovascular accident (CVA) due to occlusion  of right posterior communicating artery [facility])     Problem List Patient Active Problem List   Diagnosis Date Noted  . Left hemiparesis ([place]) 12/06/2020  . Benign essential HTN   . Cognitive deficit, post-stroke   . Controlled type 2 diabetes mellitus with hyperglycemia, without long-term current use of insulin ([place])   . 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 Dyslipidemia, goal LDL below 70 03/12/2019  . Dysphagia due to recent stroke 03/12/2019  . 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 Abnormality of gait   . Gait disturbance, post-stroke   . 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    Hall Busing, PT, DPT 12/09/2020, 4:06 PM  [place]. [place], [place], [address] Phone: [phone]   Fax:  [phone]  Name: DIJON COSENS Sr. MRN: [id number] Date of Birth: 09-18-1950

## 2020-12-10 ENCOUNTER — Telehealth: Payer: Self-pay

## 2020-12-10 NOTE — Telephone Encounter (Signed)
-----   Message from Elsie Stain, MD sent at 12/07/2020 12:11 PM EDT ----- Let pt wife know  blood count normal,  liver kidney normal

## 2020-12-10 NOTE — Telephone Encounter (Signed)
Patient has viewed results via mychart on 12/09/2020

## 2020-12-14 ENCOUNTER — Ambulatory Visit: Payer: Medicare Other | Admitting: Speech Pathology

## 2020-12-14 ENCOUNTER — Other Ambulatory Visit: Payer: Self-pay

## 2020-12-14 ENCOUNTER — Encounter: Payer: Self-pay | Admitting: Speech Pathology

## 2020-12-14 ENCOUNTER — Ambulatory Visit: Payer: Medicare Other

## 2020-12-14 DIAGNOSIS — R4701 Aphasia: Secondary | ICD-10-CM

## 2020-12-14 DIAGNOSIS — I6329 Cerebral infarction due to unspecified occlusion or stenosis of other precerebral arteries: Secondary | ICD-10-CM | POA: Diagnosis not present

## 2020-12-14 DIAGNOSIS — R293 Abnormal posture: Secondary | ICD-10-CM

## 2020-12-14 DIAGNOSIS — R2681 Unsteadiness on feet: Secondary | ICD-10-CM | POA: Diagnosis not present

## 2020-12-14 DIAGNOSIS — R41841 Cognitive communication deficit: Secondary | ICD-10-CM

## 2020-12-14 DIAGNOSIS — Z9181 History of falling: Secondary | ICD-10-CM | POA: Diagnosis not present

## 2020-12-14 DIAGNOSIS — R260 Ataxic gait: Secondary | ICD-10-CM | POA: Diagnosis not present

## 2020-12-14 DIAGNOSIS — R2689 Other abnormalities of gait and mobility: Secondary | ICD-10-CM | POA: Diagnosis not present

## 2020-12-14 DIAGNOSIS — M6281 Muscle weakness (generalized): Secondary | ICD-10-CM

## 2020-12-14 NOTE — Therapy (Signed)
[place]. [place], [place], [address] Phone: [phone]   Fax:  [phone]  Speech Language Pathology Treatment  Patient Details  Name: Joe ARTEAGA Sr. MRN: [id number] Date of Birth: May 08, 1951 Referring Provider (SLP): Elsie Stain, MD   Encounter Date: 12/14/2020   End of Session - 12/14/20 1452    Visit Number 15    Number of Visits 25    Date for SLP Re-Evaluation 01/22/21    SLP Start Time 1446    SLP Stop Time  7619    SLP Time Calculation (min) 44 min    Activity Tolerance Patient tolerated treatment well;Patient limited by fatigue           Past Medical History:  Diagnosis Date  . 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 ([place]) 06/03/2019  . Cerebrovascular accident (CVA) due to occlusion of vertebral artery ([place])   . Diabetes mellitus without complication ([place])   . Dysphagia    post stroke  . Hypertension   . Stroke [facility])     Past Surgical History:  Procedure Laterality Date  . IR ANGIO INTRA EXTRACRAN SEL COM CAROTID INNOMINATE UNI L MOD SED  03/10/2019  . IR ANGIO VERTEBRAL SEL SUBCLAVIAN INNOMINATE BILAT MOD SED  03/10/2019  . IR CT HEAD LTD  03/10/2019  . IR PERCUTANEOUS ART THROMBECTOMY/INFUSION INTRACRANIAL INC DIAG ANGIO  03/10/2019  . RADIOLOGY WITH ANESTHESIA N/A 03/10/2019   Procedure: IR WITH ANESTHESIA/CODE STROKE;  Surgeon: Radiologist, Medication, MD;  Location: [facility];  Service: Radiology;  Laterality: N/A;    There were no vitals filed for this visit.   Subjective Assessment - 12/14/20 1450    Subjective Pt appears to be tired.    Currently in Pain? No/denies                 ADULT SLP TREATMENT - 12/14/20 1454      General Information   Behavior/Cognition Alert;Cooperative;Pleasant mood;Distractible;Decreased sustained attention      Treatment Provided   Treatment provided Cognitive-Linquistic       Cognitive-Linquistic Treatment   Treatment focused on Cognition;Aphasia    Skilled Treatment Addressed aphasia by training objects. Pt required phonemic cues and sentence completion cues to complete 6/10 items. Practiced writing name on white board as well as month and date to support attention skills. Pt required written cue on whiteboard to complete month (not year) and his firs name (not last).      Assessment / Recommendations / Plan   Plan Continue with current plan of care      Progression Toward Goals   Progression toward goals Progressing toward goals              SLP Short Term Goals - 12/14/20 1453      SLP SHORT TERM GOAL #1   Title Pt will verbalize yes/no when asked a question to increase verbal communication.    Time 4    Period Weeks    Status On-going   Pt is able to make a choice verbally, but may not correctly identify.     SLP SHORT TERM GOAL #2   Title Pt will follow through with swallowing saliva before speaking at home as reported by caregiver.    Baseline Swallows with rare cues in session.    Time 4    Period Weeks    Status On-going      SLP SHORT TERM GOAL #3  Title Pt will name 6/10 daily objects given phonemic cues to assist with making choices at home.    Baseline 3    Time 4    Period Weeks    Status On-going            SLP Long Term Goals - 12/14/20 1453      SLP LONG TERM GOAL #1   Title Client will utilize compensatory strategies to communicate wants and needs effectively to different conversational partners and participate socially in functional living environment.    Time 5    Period Weeks    Status On-going      SLP LONG TERM GOAL #2   Title Client will demonstrate understanding of verbal communication related to daily activities and utilize compensatory strategies to more effectively communicate in their functional living environment.    Time 5    Period Weeks    Status On-going      SLP LONG TERM GOAL #3   Title Patient  will develop functional attention skills to effectively attend to and communicate in tasks of daily living in their functional living environment.    Time 5    Period Weeks    Status On-going            Plan - 12/14/20 1453    Clinical Impression Statement Pt is a 70 yo male who presents with severe cognitive-communication impairment and dysphagia 2/2 to cognitive impairment. Pt wife accompanied pt and required encouragement to participate in answering questions regarding impairments seen at home. Pt required verbal and tactile cueing to swallow saliva before speaking. With cueing before each communicative interaction, pt was able to participate in today's evaluation verbally. Pt experienced significantly delayed initation in answering questions and following directions. Pt wife reported her primary concern is with his lack of communication of his needs/wants. She reports mostly mumbling at home with occasional greetings. SLUMS was conducted this date to determine extent of cognitive deficit. Patient required repetition, visual, and verbal cueing to complete. Patient received a 4/30 on today's assessment; however, pt demonstrated increased accuracy with verbal cueing and when provided with multiple choice cue. SLP rec skilled speech services to increase patient's ability to communicate needs/wants and further participation in ADLs to relieve caregiver burden.    Speech Therapy Frequency 2x / week    Duration 12 weeks    Treatment/Interventions Aspiration precaution training;Cueing hierarchy;Patient/family education;Functional tasks;Environmental controls;Cognitive reorganization;Multimodal communcation approach;Language facilitation;Compensatory techniques;Internal/external aids;SLP instruction and feedback;Compensatory strategies    Potential to Achieve Goals Fair    Potential Considerations Ability to learn/carryover information;Severity of impairments    Consulted and Agree with Plan of Care  Patient;Family member/caregiver           Patient will benefit from skilled therapeutic intervention in order to improve the following deficits and impairments:   Aphasia  Cognitive communication deficit    Problem List Patient Active Problem List   Diagnosis Date Noted  . Left hemiparesis ([place]) 12/06/2020  . Benign essential HTN   . Cognitive deficit, post-stroke   . Controlled type 2 diabetes mellitus with hyperglycemia, without long-term current use of insulin ([place])   . 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 Dyslipidemia, goal LDL below 70 03/12/2019  . Dysphagia due to recent stroke 03/12/2019  . 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 Abnormality of gait   . Gait disturbance,  post-stroke   . 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 [place], [place], [address] Phone: [phone]   Fax:  [phone]   Name: Joe LAFOREST Sr. MRN: [id number] Date of Birth: March 10, 1951

## 2020-12-14 NOTE — Therapy (Signed)
[place]. [place], [place], [address] Phone: [phone]   Fax:  [phone]  Physical Therapy Treatment  Patient Details  Name: Joe COSTABILE Sr. MRN: [id number] Date of Birth: 16-Jun-1951 Referring Provider (PT): Asencion Noble, MD   Encounter Date: 12/14/2020   PT End of Session - 12/14/20 1444    Visit Number 7    Number of Visits 17    Date for PT Re-Evaluation 01/06/21    Authorization Type UHC Medicare    PT Start Time 1400    PT Stop Time 1445    PT Time Calculation (min) 45 min    Activity Tolerance Patient tolerated treatment well;Patient limited by fatigue    Behavior During Therapy Flat affect           Past Medical History:  Diagnosis Date  . 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 ([place]) 06/03/2019  . Cerebrovascular accident (CVA) due to occlusion of vertebral artery ([place])   . Diabetes mellitus without complication ([place])   . Dysphagia    post stroke  . Hypertension   . Stroke [facility])     Past Surgical History:  Procedure Laterality Date  . IR ANGIO INTRA EXTRACRAN SEL COM CAROTID INNOMINATE UNI L MOD SED  03/10/2019  . IR ANGIO VERTEBRAL SEL SUBCLAVIAN INNOMINATE BILAT MOD SED  03/10/2019  . IR CT HEAD LTD  03/10/2019  . IR PERCUTANEOUS ART THROMBECTOMY/INFUSION INTRACRANIAL INC DIAG ANGIO  03/10/2019  . RADIOLOGY WITH ANESTHESIA N/A 03/10/2019   Procedure: IR WITH ANESTHESIA/CODE STROKE;  Surgeon: Radiologist, Medication, MD;  Location: [place];  Service: Radiology;  Laterality: N/A;    There were no vitals filed for this visit.   Subjective Assessment - 12/14/20 1442    Subjective Wife reports he doesnt want to do exercises at home and frequently looks down and slouches especially when sitting. Also though she noticed some swelling in his legs still, discussed with PT and advised to follow up with PCP    Patient is accompained by: Family member     Limitations Walking    Patient Stated Goals Pt's goals for therapy are to walk better. (pt agrees with wife's statement)    Currently in Pain? No/denies                             [facility] Adult PT Treatment/Exercise - 12/14/20 0001      High Level Balance   High Level Balance Comments forward and backward step taking with FWW max cues for walker advancement, upright gaze      Therapeutic Activites    Other Therapeutic Activities CGA/MIN A provided to ambulate 100 ft with RW, max verbal cues for upward gaze and upright posture, RW proximity (too close at times, increased step lengths and heights - some foot drag left with fatigue). Multidirecitonal reach and place cones 2x12 B, seated on dynadisc. Towel slides/hand slides on wall for upright posture 10 x 2      Knee/Hip Exercises: Aerobic   Nustep L3 x 8 minutes      Knee/Hip Exercises: Standing   Other Standing Knee Exercises in  bars: step taps to firm airex B x10 alternating, marches - cues needed to increase right height x10 B. hip abd x 10 B .      Knee/Hip Exercises: Seated   Long Arc Quad Both;10 reps;1 set    Long  Arc Quad Weight 2 lbs.    Marching --    Sit to [employer] 2 sets;10 reps;with UE support   tactile cues for upright posture                   PT Short Term Goals - 11/11/20 1445      PT SHORT TERM GOAL #1   Title Pt will perform HEP with wife's assistance, for improved strength, balance, gait.    Time 4    Period Weeks    Status New    Target Date 12/09/20             PT Long Term Goals - 11/11/20 1446      PT LONG TERM GOAL #1   Title Pt will perform progressive HEP with family supervision and assistance for improved balance, strength, gait.    Time 8    Period Weeks    Status New    Target Date 01/06/21      PT LONG TERM GOAL #2   Title Pt will improve 5TSTS score to </= 20 seconds to demonstrate improved functional strength and decreased fall risk    Time 8    Period  Weeks    Status New    Target Date 01/06/21      PT LONG TERM GOAL #3   Title Pt will improve TUG score to less than or equal to 25 seconds for decreased fall risk.    Time 8    Period Weeks    Status New    Target Date 01/06/21                 Plan - 12/14/20 1444    Clinical Impression Statement Mr Narine tolerated all exercises nicely. Progressed with standing exercises in  bars , will benefit from continued training. May trial sample AFO on the left to see if that helps with foot clearance and tibial control in stance in next session    Personal Factors and Comorbidities Comorbidity 3+    Comorbidities DM, HTN, CVA, dsyphagia    Examination-Activity Limitations Locomotion Level;Transfers;Stand;Stairs    Examination-Participation Restrictions Community Activity;Other    Rehab Potential Fair   cognitive impairments, decreased safety awareness, severity of deficits   PT Frequency 2x / week    PT Duration 8 weeks    PT Treatment/Interventions ADLs/Self Care Home Management;DME Instruction;Neuromuscular re-education;Balance training;Therapeutic exercise;Therapeutic activities;Functional mobility training;Stair training;Gait training;Patient/family education;Energy conservation;Manual techniques    PT Next Visit Plan Transfer training, gait training; wife education for safety with transfers and gait, consider QP positioning at some point progressing to tall kneeling and potentially 1/2 kneel with UE assist; postural upright, hard airex for pelvic neutral.  Can trial next visit with small wedge or towel roll under the left to see if we can kick in more trunk righting reactions and active WS left    PT Home Exercise Plan Educated wife in verbal cues for tall upright posture, use of horizontal sacral towel roll to promote WB through ischial tuberosities and improve pelvic alignnet and posture in sitting    Consulted and Agree with Plan of Care Patient;Family member/caregiver    Family  Member Consulted wife           Patient will benefit from skilled therapeutic intervention in order to improve the following deficits and impairments:  Abnormal gait,Decreased coordination,Difficulty walking,Decreased safety awareness,Decreased balance,Decreased strength,Decreased mobility  Visit Diagnosis: Muscle weakness (generalized)  Ataxic gait  Abnormal posture  Other abnormalities  of gait and mobility  History of falling  Unsteadiness on feet  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 [facility])     Problem List Patient Active Problem List   Diagnosis Date Noted  . Left hemiparesis ([place]) 12/06/2020  . Benign essential HTN   . Cognitive deficit, post-stroke   . Controlled type 2 diabetes mellitus with hyperglycemia, without long-term current use of insulin ([place])   . Vestibular schwannoma ([place]) 06/03/2019  . 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 Dyslipidemia, goal LDL below 70 03/12/2019  . Dysphagia due to recent stroke 03/12/2019  . 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 Abnormality of gait   . Gait disturbance, post-stroke   . 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    Hall Busing, PT, DPT 12/14/2020, 2:45 PM  [place]. [place], [place], [address] Phone: [phone]   Fax:  [phone]  Name: Joe SHERROW Sr. MRN: [id number] Date of Birth: 01-19-1951

## 2020-12-16 ENCOUNTER — Ambulatory Visit: Payer: Medicare Other | Admitting: Speech Pathology

## 2020-12-16 ENCOUNTER — Encounter: Payer: Self-pay | Admitting: Speech Pathology

## 2020-12-16 ENCOUNTER — Ambulatory Visit: Payer: Medicare Other

## 2020-12-16 ENCOUNTER — Other Ambulatory Visit: Payer: Self-pay

## 2020-12-16 DIAGNOSIS — I6329 Cerebral infarction due to unspecified occlusion or stenosis of other precerebral arteries: Secondary | ICD-10-CM | POA: Diagnosis not present

## 2020-12-16 DIAGNOSIS — R260 Ataxic gait: Secondary | ICD-10-CM

## 2020-12-16 DIAGNOSIS — Z9181 History of falling: Secondary | ICD-10-CM | POA: Diagnosis not present

## 2020-12-16 DIAGNOSIS — R2681 Unsteadiness on feet: Secondary | ICD-10-CM

## 2020-12-16 DIAGNOSIS — R293 Abnormal posture: Secondary | ICD-10-CM

## 2020-12-16 DIAGNOSIS — R2689 Other abnormalities of gait and mobility: Secondary | ICD-10-CM | POA: Diagnosis not present

## 2020-12-16 DIAGNOSIS — M6281 Muscle weakness (generalized): Secondary | ICD-10-CM | POA: Diagnosis not present

## 2020-12-16 DIAGNOSIS — R4701 Aphasia: Secondary | ICD-10-CM

## 2020-12-16 DIAGNOSIS — R41841 Cognitive communication deficit: Secondary | ICD-10-CM

## 2020-12-16 NOTE — Therapy (Signed)
[address] [place]. [place], [place], [address] Phone: [phone]   Fax:  [phone]  Speech Language Pathology Treatment  Patient Details  Name: Joe WENDORF Sr. MRN: [id number] Date of Birth: 1951-02-20 Referring Provider (SLP): Elsie Stain, MD   Encounter Date: 12/16/2020   End of Session - 12/16/20 1609    Visit Number 16    Number of Visits 25    Date for SLP Re-Evaluation 01/22/21    SLP Start Time 1535    SLP Stop Time  7169    SLP Time Calculation (min) 38 min    Activity Tolerance Patient tolerated treatment well;Patient limited by fatigue           Past Medical History:  Diagnosis Date  . 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 ([place]) 06/03/2019  . Cerebrovascular accident (CVA) due to occlusion of vertebral artery ([place])   . Diabetes mellitus without complication ([place])   . Dysphagia    post stroke  . Hypertension   . Stroke [facility])     Past Surgical History:  Procedure Laterality Date  . IR ANGIO INTRA EXTRACRAN SEL COM CAROTID INNOMINATE UNI L MOD SED  03/10/2019  . IR ANGIO VERTEBRAL SEL SUBCLAVIAN INNOMINATE BILAT MOD SED  03/10/2019  . IR CT HEAD LTD  03/10/2019  . IR PERCUTANEOUS ART THROMBECTOMY/INFUSION INTRACRANIAL INC DIAG ANGIO  03/10/2019  . RADIOLOGY WITH ANESTHESIA N/A 03/10/2019   Procedure: IR WITH ANESTHESIA/CODE STROKE;  Surgeon: Radiologist, Medication, MD;  Location: [place];  Service: Radiology;  Laterality: N/A;    There were no vitals filed for this visit.   Subjective Assessment - 12/16/20 1540    Subjective Pt reports he feels good today.    Currently in Pain? No/denies                 ADULT SLP TREATMENT - 12/16/20 1606      General Information   Behavior/Cognition Alert;Cooperative;Pleasant mood;Distractible;Decreased sustained attention      Treatment Provided   Treatment provided Cognitive-Linquistic       Cognitive-Linquistic Treatment   Treatment focused on Aphasia    Skilled Treatment Addressed aphasia by training objects. Pt required phonemic cues and sentence completion cues to complete all items. Spontaneously named x2 items throughout this session Practiced writing name on white board as well as month and date to support attention skills. Pt required written cue on whiteboard to complete month and year and his last name.      Assessment / Recommendations / Plan   Plan Continue with current plan of care      Progression Toward Goals   Progression toward goals Progressing toward goals              SLP Short Term Goals - 12/16/20 1609      SLP SHORT TERM GOAL #1   Title Pt will verbalize yes/no when asked a question to increase verbal communication.    Time 4    Period Weeks    Status On-going   Pt is able to make a choice verbally, but may not correctly identify.     SLP SHORT TERM GOAL #2   Title Pt will follow through with swallowing saliva before speaking at home as reported by caregiver.    Baseline Swallows with rare cues in session.    Time 4    Period Weeks    Status On-going  SLP SHORT TERM GOAL #3   Title Pt will name 6/10 daily objects given phonemic cues to assist with making choices at home.    Baseline 3    Time 4    Period Weeks    Status On-going            SLP Long Term Goals - 12/14/20 1453      SLP LONG TERM GOAL #1   Title Client will utilize compensatory strategies to communicate wants and needs effectively to different conversational partners and participate socially in functional living environment.    Time 5    Period Weeks    Status On-going      SLP LONG TERM GOAL #2   Title Client will demonstrate understanding of verbal communication related to daily activities and utilize compensatory strategies to more effectively communicate in their functional living environment.    Time 5    Period Weeks    Status On-going      SLP LONG  TERM GOAL #3   Title Patient will develop functional attention skills to effectively attend to and communicate in tasks of daily living in their functional living environment.    Time 5    Period Weeks    Status On-going            Plan - 12/16/20 1609    Clinical Impression Statement Pt is a 70 yo male who presents with severe cognitive-communication impairment and dysphagia 2/2 to cognitive impairment. Pt wife accompanied pt and required encouragement to participate in answering questions regarding impairments seen at home. Pt required verbal and tactile cueing to swallow saliva before speaking. With cueing before each communicative interaction, pt was able to participate in today's evaluation verbally. Pt experienced significantly delayed initation in answering questions and following directions. Pt wife reported her primary concern is with his lack of communication of his needs/wants. She reports mostly mumbling at home with occasional greetings. SLUMS was conducted this date to determine extent of cognitive deficit. Patient required repetition, visual, and verbal cueing to complete. Patient received a 4/30 on today's assessment; however, pt demonstrated increased accuracy with verbal cueing and when provided with multiple choice cue. SLP rec skilled speech services to increase patient's ability to communicate needs/wants and further participation in ADLs to relieve caregiver burden.    Speech Therapy Frequency 2x / week    Duration 12 weeks    Treatment/Interventions Aspiration precaution training;Cueing hierarchy;Patient/family education;Functional tasks;Environmental controls;Cognitive reorganization;Multimodal communcation approach;Language facilitation;Compensatory techniques;Internal/external aids;SLP instruction and feedback;Compensatory strategies    Potential to Achieve Goals Fair    Potential Considerations Ability to learn/carryover information;Severity of impairments    Consulted and  Agree with Plan of Care Patient;Family member/caregiver           Patient will benefit from skilled therapeutic intervention in order to improve the following deficits and impairments:   Aphasia  Cognitive communication deficit    Problem List Patient Active Problem List   Diagnosis Date Noted  . Left hemiparesis ([place]) 12/06/2020  . Benign essential HTN   . Cognitive deficit, post-stroke   . Controlled type 2 diabetes mellitus with hyperglycemia, without long-term current use of insulin ([place])   . 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 Dyslipidemia, goal LDL below 70 03/12/2019  . Dysphagia due to recent stroke 03/12/2019  . 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 Abnormality  of gait   . Gait disturbance, post-stroke   . 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    [name], [place], [name]  12/16/2020, 4:11 PM  [place]. [place], [place], [address] Phone: [phone]   Fax:  [phone]   Name: Joe ICKES Sr. MRN: [id number] Date of Birth: 14-Oct-1950

## 2020-12-16 NOTE — Therapy (Signed)
[place] [address]. [place], [place], [address] Phone: [phone]   Fax:  [phone]  Physical Therapy Treatment  Patient Details  Name: Joe HEIBERGER Sr. MRN: [id number] Date of Birth: 1951-01-17 Referring Provider (PT): Asencion Noble, MD   Encounter Date: 12/16/2020   PT End of Session - 12/16/20 1711    Visit Number 8    Number of Visits 17    Date for PT Re-Evaluation 01/06/21    Authorization Type UHC Medicare    PT Start Time 1616    PT Stop Time 1700    PT Time Calculation (min) 44 min    Activity Tolerance Patient tolerated treatment well;Patient limited by fatigue    Behavior During Therapy Flat affect           Past Medical History:  Diagnosis Date  . 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 ([place]) 06/03/2019  . Cerebrovascular accident (CVA) due to occlusion of vertebral artery ([place])   . Diabetes mellitus without complication ([place])   . Dysphagia    post stroke  . Hypertension   . Stroke [facility])     Past Surgical History:  Procedure Laterality Date  . IR ANGIO INTRA EXTRACRAN SEL COM CAROTID INNOMINATE UNI L MOD SED  03/10/2019  . IR ANGIO VERTEBRAL SEL SUBCLAVIAN INNOMINATE BILAT MOD SED  03/10/2019  . IR CT HEAD LTD  03/10/2019  . IR PERCUTANEOUS ART THROMBECTOMY/INFUSION INTRACRANIAL INC DIAG ANGIO  03/10/2019  . RADIOLOGY WITH ANESTHESIA N/A 03/10/2019   Procedure: IR WITH ANESTHESIA/CODE STROKE;  Surgeon: Radiologist, Medication, MD;  Location: [place];  Service: Radiology;  Laterality: N/A;    There were no vitals filed for this visit.   Subjective Assessment - 12/16/20 1710    Subjective Doing okay. Almost fell at home per wife report.    Patient Stated Goals Pt's goals for therapy are to walk better. (pt agrees with wife's statement)    Currently in Pain? No/denies             [facility] Adult PT Treatment/Exercise - 12/16/20 0001       Ambulation/Gait   Ambulation/Gait Yes    Gait Comments 200 ft  with FWW, Functional distances within clinic, tactile cues and VC upright posture, quick stretch to left hip flexor and left [place] foot-up donned to facilitate foot clearance.      High Level Balance   High Level Balance Comments side stepping in bars      Therapeutic Activites    Other Therapeutic Activities bed mobility: independent with increased time for rolling and transsitions from supine/sidelying.  min A for arm repositioning to get into prone. min A/mod VC/TC to get into QP. QP reaching to wifes hands alternating with forward gaze x 10 B. Heel sit/mod QP <> tall kneel  holds 10 x 5". Standing at wall forward reaches up with upright posture x 15      Knee/Hip Exercises: Standing   Other Standing Knee Exercises in  bars:  marches - cues needed to increase right height x10 B. hip abd x 10 B .      Knee/Hip Exercises: Seated   Long Arc Quad Both;2 sets;10 reps   seated on dynadisc, 3 #   Long Arc Quad Weight 3 lbs.    Marching Both;2 sets;10 reps   seated on dynadisc, 3 #   Sit to [place] 2 sets;10 reps;with UE support  PT Short Term Goals - 11/11/20 1445      PT SHORT TERM GOAL #1   Title Pt will perform HEP with wife's assistance, for improved strength, balance, gait.    Time 4    Period Weeks    Status New    Target Date 12/09/20             PT Long Term Goals - 11/11/20 1446      PT LONG TERM GOAL #1   Title Pt will perform progressive HEP with family supervision and assistance for improved balance, strength, gait.    Time 8    Period Weeks    Status New    Target Date 01/06/21      PT LONG TERM GOAL #2   Title Pt will improve 5TSTS score to </= 20 seconds to demonstrate improved functional strength and decreased fall risk    Time 8    Period Weeks    Status New    Target Date 01/06/21      PT LONG TERM GOAL #3   Title Pt will improve TUG score to less than or equal  to 25 seconds for decreased fall risk.    Time 8    Period Weeks    Status New    Target Date 01/06/21                 Plan - 12/16/20 1712    Clinical Impression Statement tolerated all exercises nicely today. Trialed foot up ankle assist device on the left with some improvement in foot clearance with gait training. Was also able to get into some developmental positions today QP, kneeling with good tolerance. Noted increased WS right in standing reaches by wall, responded fairly to tactile cues for midline posture.    Personal Factors and Comorbidities Comorbidity 3+    Comorbidities DM, HTN, CVA, dsyphagia    Examination-Activity Limitations Locomotion Level;Transfers;Stand;Stairs    Examination-Participation Restrictions Community Activity;Other    Rehab Potential Fair   cognitive impairments, decreased safety awareness, severity of deficits   PT Frequency 2x / week    PT Duration 8 weeks    PT Treatment/Interventions ADLs/Self Care Home Management;DME Instruction;Neuromuscular re-education;Balance training;Therapeutic exercise;Therapeutic activities;Functional mobility training;Stair training;Gait training;Patient/family education;Energy conservation;Manual techniques    PT Next Visit Plan Transfer training, gait training; wife education for safety with transfers and gait, consider QP positioning at some point progressing to tall kneeling and potentially 1/2 kneel with UE assist; postural upright, hard airex for pelvic neutral.  Can trial next visit with small wedge or towel roll under the left to see if we can kick in more trunk righting reactions and active WS left.    PT Home Exercise Plan Educated wife in verbal cues for tall upright posture, use of horizontal sacral towel roll to promote WB through ischial tuberosities and improve pelvic alignnet and posture in sitting    Consulted and Agree with Plan of Care Patient;Family member/caregiver    Family Member Consulted wife            Patient will benefit from skilled therapeutic intervention in order to improve the following deficits and impairments:  Abnormal gait,Decreased coordination,Difficulty walking,Decreased safety awareness,Decreased balance,Decreased strength,Decreased mobility  Visit Diagnosis: Muscle weakness (generalized)  Ataxic gait  Abnormal posture  Other abnormalities of gait and mobility  History of falling  Unsteadiness on feet  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 [facility])     Problem List Patient Active  Problem List   Diagnosis Date Noted  . Left hemiparesis ([place]) 12/06/2020  . Benign essential HTN   . Cognitive deficit, post-stroke   . Controlled type 2 diabetes mellitus with hyperglycemia, without long-term current use of insulin ([place])   . 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 Dyslipidemia, goal LDL below 70 03/12/2019  . Dysphagia due to recent stroke 03/12/2019  . 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 Abnormality of gait   . Gait disturbance, post-stroke   . 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    Hall Busing, PT, DPT 12/16/2020, 5:16 PM  [place]. [place], [place], [address] Phone: [phone]   Fax:  [phone]  Name: DELTON STELLE Sr. MRN: [id number] Date of Birth: 1950/11/19

## 2020-12-21 ENCOUNTER — Ambulatory Visit: Payer: Medicare Other | Admitting: Speech Pathology

## 2020-12-21 ENCOUNTER — Other Ambulatory Visit: Payer: Self-pay

## 2020-12-21 ENCOUNTER — Ambulatory Visit: Payer: Medicare Other

## 2020-12-21 ENCOUNTER — Encounter: Payer: Self-pay | Admitting: Speech Pathology

## 2020-12-21 DIAGNOSIS — I6329 Cerebral infarction due to unspecified occlusion or stenosis of other precerebral arteries: Secondary | ICD-10-CM | POA: Diagnosis not present

## 2020-12-21 DIAGNOSIS — R2681 Unsteadiness on feet: Secondary | ICD-10-CM | POA: Diagnosis not present

## 2020-12-21 DIAGNOSIS — R2689 Other abnormalities of gait and mobility: Secondary | ICD-10-CM | POA: Diagnosis not present

## 2020-12-21 DIAGNOSIS — R260 Ataxic gait: Secondary | ICD-10-CM

## 2020-12-21 DIAGNOSIS — R293 Abnormal posture: Secondary | ICD-10-CM

## 2020-12-21 DIAGNOSIS — R41841 Cognitive communication deficit: Secondary | ICD-10-CM

## 2020-12-21 DIAGNOSIS — R4701 Aphasia: Secondary | ICD-10-CM | POA: Diagnosis not present

## 2020-12-21 DIAGNOSIS — Z9181 History of falling: Secondary | ICD-10-CM

## 2020-12-21 DIAGNOSIS — M6281 Muscle weakness (generalized): Secondary | ICD-10-CM | POA: Diagnosis not present

## 2020-12-21 NOTE — Therapy (Signed)
[place]. [place], [place], [address] Phone: [phone]   Fax:  [phone]  Speech Language Pathology Treatment  Patient Details  Name: TELLER WAKEFIELD Sr. MRN: [id number] Date of Birth: [birth year] Referring Provider (SLP): Elsie Stain, MD   Encounter Date: 12/21/2020   End of Session - 12/21/20 1327    Visit Number 17    Number of Visits 25    Date for SLP Re-Evaluation 01/22/21    SLP Start Time 1322    SLP Stop Time  1400    SLP Time Calculation (min) 38 min    Activity Tolerance Patient tolerated treatment well;Patient limited by fatigue           Past Medical History:  Diagnosis Date  . 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 ([place]) 06/03/2019  . Cerebrovascular accident (CVA) due to occlusion of vertebral artery ([place])   . Diabetes mellitus without complication ([place])   . Dysphagia    post stroke  . Hypertension   . Stroke [facility])     Past Surgical History:  Procedure Laterality Date  . IR ANGIO INTRA EXTRACRAN SEL COM CAROTID INNOMINATE UNI L MOD SED  03/10/2019  . IR ANGIO VERTEBRAL SEL SUBCLAVIAN INNOMINATE BILAT MOD SED  03/10/2019  . IR CT HEAD LTD  03/10/2019  . IR PERCUTANEOUS ART THROMBECTOMY/INFUSION INTRACRANIAL INC DIAG ANGIO  03/10/2019  . RADIOLOGY WITH ANESTHESIA N/A 03/10/2019   Procedure: IR WITH ANESTHESIA/CODE STROKE;  Surgeon: Radiologist, Medication, MD;  Location: [place];  Service: Radiology;  Laterality: N/A;    There were no vitals filed for this visit.   Subjective Assessment - 12/21/20 1327    Subjective Pt reports everything is going "fine" at home.    Currently in Pain? No/denies                 ADULT SLP TREATMENT - 12/21/20 1358      General Information   Behavior/Cognition Alert;Cooperative;Pleasant mood;Distractible;Decreased sustained attention      Treatment Provided   Treatment provided  Cognitive-Linquistic      Cognitive-Linquistic Treatment   Treatment focused on Aphasia    Skilled Treatment Trained patient on answering yes/no questions verbally. Pt Was given a visual cue of "yes, no"  vertically on a white board to assist with visual deficit. Pt required SLP to ask the question and repeat "yes or no" so patient would answer it. Attempted to trial anagam copy and recall with 3 letter words. Pt was able to form 3 letter words with 70% accuracy;however, had difficulty copying the words independently. Pt was incontinant in today's session. Alerted wife and wife assisted pt in changing him.      Assessment / Recommendations / Plan   Plan Continue with current plan of care      Progression Toward Goals   Progression toward goals Progressing toward goals              SLP Short Term Goals - 12/21/20 1329      SLP SHORT TERM GOAL #1   Title Pt will verbalize yes/no when asked a question to increase verbal communication.    Time 4    Period Weeks    Status On-going   Pt is able to make a choice verbally, but may not correctly identify.     SLP SHORT TERM GOAL #2   Title Pt will follow through with swallowing saliva before speaking at home  as reported by caregiver.    Baseline Swallows with rare cues in session.    Time 4    Period Weeks    Status On-going      SLP SHORT TERM GOAL #3   Title Pt will name 6/10 daily objects given phonemic cues to assist with making choices at home.    Baseline 3    Time 4    Period Weeks    Status On-going            SLP Long Term Goals - 12/21/20 1329      SLP LONG TERM GOAL #1   Title Client will utilize compensatory strategies to communicate wants and needs effectively to different conversational partners and participate socially in functional living environment.    Time 5    Period Weeks    Status On-going      SLP LONG TERM GOAL #2   Title Client will demonstrate understanding of verbal communication related to daily  activities and utilize compensatory strategies to more effectively communicate in their functional living environment.    Time 5    Period Weeks    Status On-going      SLP LONG TERM GOAL #3   Title Patient will develop functional attention skills to effectively attend to and communicate in tasks of daily living in their functional living environment.    Time 5    Period Weeks    Status On-going            Plan - 12/21/20 1329    Clinical Impression Statement Pt is a 70 yo male who presents with severe cognitive-communication impairment and dysphagia 2/2 to cognitive impairment. Pt wife accompanied pt and required encouragement to participate in answering questions regarding impairments seen at home. Pt required verbal and tactile cueing to swallow saliva before speaking. With cueing before each communicative interaction, pt was able to participate in today's evaluation verbally. Pt experienced significantly delayed initation in answering questions and following directions. Pt wife reported her primary concern is with his lack of communication of his needs/wants. She reports mostly mumbling at home with occasional greetings. SLUMS was conducted this date to determine extent of cognitive deficit. Patient required repetition, visual, and verbal cueing to complete. Patient received a 4/30 on today's assessment; however, pt demonstrated increased accuracy with verbal cueing and when provided with multiple choice cue. SLP rec skilled speech services to increase patient's ability to communicate needs/wants and further participation in ADLs to relieve caregiver burden.    Speech Therapy Frequency 2x / week    Duration 12 weeks    Treatment/Interventions Aspiration precaution training;Cueing hierarchy;Patient/family education;Functional tasks;Environmental controls;Cognitive reorganization;Multimodal communcation approach;Language facilitation;Compensatory techniques;Internal/external aids;SLP instruction  and feedback;Compensatory strategies    Potential to Achieve Goals Fair    Potential Considerations Ability to learn/carryover information;Severity of impairments    Consulted and Agree with Plan of Care Patient;Family member/caregiver           Patient will benefit from skilled therapeutic intervention in order to improve the following deficits and impairments:   Aphasia  Cognitive communication deficit    Problem List Patient Active Problem List   Diagnosis Date Noted  . Left hemiparesis ([place]) 12/06/2020  . Benign essential HTN   . Cognitive deficit, post-stroke   . Controlled type 2 diabetes mellitus with hyperglycemia, without long-term current use of insulin ([place])   . Vestibular schwannoma ([place]) 06/03/2019  . 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 Dyslipidemia, goal LDL below 70 03/12/2019  .  Dysphagia due to recent stroke 03/12/2019  . 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 Abnormality of gait   . Gait disturbance, post-stroke   . 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    [name], [place], [name]  12/21/2020, 2:09 PM  [place]. [place], [place], [address] Phone: [phone]   Fax:  [phone]   Name: SOLLY DERASMO Sr. MRN: [id number] Date of Birth: 18-Jul-1951

## 2020-12-21 NOTE — Therapy (Signed)
[place]. [place], [place], [address] Phone: [phone]   Fax:  [phone]  Physical Therapy Treatment  Patient Details  Name: Joe WALCK Sr. MRN: [id number] Date of Birth: March 10, 1951 Referring Provider (PT): Asencion [place], MD   Encounter Date: 12/21/2020   PT End of Session - 12/21/20 1407    Visit Number 9    Number of Visits 17    Date for PT Re-Evaluation 01/06/21    Authorization Type [employer] Medicare    PT Start Time 7867   treatment began 2:12 pm d/t toileting   PT Stop Time 1445    PT Time Calculation (min) 40 min    Activity Tolerance Patient tolerated treatment well;Patient limited by fatigue    Behavior During Therapy Flat affect           Past Medical History:  Diagnosis Date  . 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 ([place]) 06/03/2019  . Cerebrovascular accident (CVA) due to occlusion of vertebral artery ([place])   . Diabetes mellitus without complication ([place])   . Dysphagia    post stroke  . Hypertension   . Stroke [facility])     Past Surgical History:  Procedure Laterality Date  . IR ANGIO INTRA EXTRACRAN SEL COM CAROTID INNOMINATE UNI L MOD SED  03/10/2019  . IR ANGIO VERTEBRAL SEL SUBCLAVIAN INNOMINATE BILAT MOD SED  03/10/2019  . IR CT HEAD LTD  03/10/2019  . IR PERCUTANEOUS ART THROMBECTOMY/INFUSION INTRACRANIAL INC DIAG ANGIO  03/10/2019  . RADIOLOGY WITH ANESTHESIA N/A 03/10/2019   Procedure: IR WITH ANESTHESIA/CODE STROKE;  Surgeon: Radiologist, Medication, MD;  Location: [place];  Service: Radiology;  Laterality: N/A;    There were no vitals filed for this visit.   Subjective Assessment - 12/21/20 1406    Subjective Doing okay.    Patient is accompained by: Family member    Limitations Walking    Patient Stated Goals Pt's goals for therapy are to walk better. (pt agrees with wife's statement)    Currently in Pain? No/denies                  [facility] Adult PT Treatment/Exercise - 12/21/20 0001      Ambulation/Gait   Gait Comments functional distances in clinic with FWW and tactile cues for spinal extension, fwd gaze      Therapeutic Activites    Other Therapeutic Activities bed mobility: independent with increased time for rolling and transsitions from supine/sidelying.  min A for arm repositioning to get into prone. min A/mod VC/TC to get into QP. QP reaching and placing cones across midline 2 x 10 B. Heel sit/mod QP <> tall kneel  holds 10 x 5". side sitting <> QP transitions x 2 B. Sit to stand with 4" step under right foot 10 x 2 with improved L elongation, with BUE support                    PT Short Term Goals - 11/11/20 1445      PT SHORT TERM GOAL #1   Title Pt will perform HEP with wife's assistance, for improved strength, balance, gait.    Time 4    Period Weeks    Status New    Target Date 12/09/20             PT Long Term Goals - 11/11/20 1446      PT LONG TERM GOAL #1  Title Pt will perform progressive HEP with family supervision and assistance for improved balance, strength, gait.    Time 8    Period Weeks    Status New    Target Date 01/06/21      PT LONG TERM GOAL #2   Title Pt will improve 5TSTS score to </= 20 seconds to demonstrate improved functional strength and decreased fall risk    Time 8    Period Weeks    Status New    Target Date 01/06/21      PT LONG TERM GOAL #3   Title Pt will improve TUG score to less than or equal to 25 seconds for decreased fall risk.    Time 8    Period Weeks    Status New    Target Date 01/06/21                 Plan - 12/21/20 1407    Clinical Impression Statement Tolerated all exercises nicely today. Start of session exercises delayed d/t need for restroom for possible incontinence. Emphasis of todays session on getting improved elongation along left side with sit to stands, tolerated well. rest of session spent on  developmental positions and strengthening - improved spinal extension with reaches in QP today.    Personal Factors and Comorbidities Comorbidity 3+    Comorbidities DM, HTN, CVA, dsyphagia    Examination-Activity Limitations Locomotion Level;Transfers;Stand;Stairs    Examination-Participation Restrictions Community Activity;Other    Rehab Potential Fair   cognitive impairments, decreased safety awareness, severity of deficits   PT Frequency 2x / week    PT Duration 8 weeks    PT Treatment/Interventions ADLs/Self Care Home Management;DME Instruction;Neuromuscular re-education;Balance training;Therapeutic exercise;Therapeutic activities;Functional mobility training;Stair training;Gait training;Patient/family education;Energy conservation;Manual techniques    PT Next Visit Plan Transfer training, gait training; wife education for safety with transfers and gait, consider QP positioning at some point progressing to tall kneeling and potentially 1/2 kneel with UE assist; postural upright, hard airex for pelvic neutral.  Can trial next visit with small wedge or towel roll under the left to see if we can kick in more trunk righting reactions and active WS left.    PT Home Exercise Plan Educated wife in verbal cues for tall upright posture, use of horizontal sacral towel roll to promote WB through ischial tuberosities and improve pelvic alignnet and posture in sitting    Consulted and Agree with Plan of Care Patient;Family member/caregiver    Family Member Consulted wife           Patient will benefit from skilled therapeutic intervention in order to improve the following deficits and impairments:  Abnormal gait,Decreased coordination,Difficulty walking,Decreased safety awareness,Decreased balance,Decreased strength,Decreased mobility  Visit Diagnosis: Muscle weakness (generalized)  Ataxic gait  Abnormal posture  Other abnormalities of gait and mobility  History of falling  Unsteadiness on  feet  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 Left hemiparesis ([place]) 12/06/2020  . Benign essential HTN   . Cognitive deficit, post-stroke   . Controlled type 2 diabetes mellitus with hyperglycemia, without long-term current use of insulin ([place])   . 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 Dyslipidemia, goal LDL below 70 03/12/2019  . Dysphagia due to recent stroke 03/12/2019  . 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 Abnormality of gait   . Gait disturbance, post-stroke   . 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    Hall Busing, PT, DPT 12/21/2020, 2:50 PM  [place]. [place], [place], [address] Phone: [phone]   Fax:  [phone]  Name: Joe BRAY Sr. MRN: [id number] Date of Birth: 10-Dec-1950

## 2020-12-23 ENCOUNTER — Other Ambulatory Visit: Payer: Self-pay

## 2020-12-23 ENCOUNTER — Ambulatory Visit: Payer: Medicare Other | Admitting: Speech Pathology

## 2020-12-23 ENCOUNTER — Ambulatory Visit: Payer: Medicare Other

## 2020-12-23 DIAGNOSIS — R293 Abnormal posture: Secondary | ICD-10-CM | POA: Diagnosis not present

## 2020-12-23 DIAGNOSIS — Z9181 History of falling: Secondary | ICD-10-CM

## 2020-12-23 DIAGNOSIS — R41841 Cognitive communication deficit: Secondary | ICD-10-CM

## 2020-12-23 DIAGNOSIS — I6329 Cerebral infarction due to unspecified occlusion or stenosis of other precerebral arteries: Secondary | ICD-10-CM | POA: Diagnosis not present

## 2020-12-23 DIAGNOSIS — R2681 Unsteadiness on feet: Secondary | ICD-10-CM

## 2020-12-23 DIAGNOSIS — R4701 Aphasia: Secondary | ICD-10-CM | POA: Diagnosis not present

## 2020-12-23 DIAGNOSIS — R2689 Other abnormalities of gait and mobility: Secondary | ICD-10-CM | POA: Diagnosis not present

## 2020-12-23 DIAGNOSIS — M6281 Muscle weakness (generalized): Secondary | ICD-10-CM

## 2020-12-23 DIAGNOSIS — R260 Ataxic gait: Secondary | ICD-10-CM | POA: Diagnosis not present

## 2020-12-23 NOTE — Therapy (Signed)
[place]. [place], [place], [address] Phone: [phone]   Fax:  [phone]  Physical Therapy Treatment Progress Note Reporting Period 11/11/2020 to 12/23/2020  See note below for Objective Data and Assessment of Progress/Goals.       Patient Details  Name: Joe Perry. MRN: [id number] Date of Birth: 20-Apr-1951 Referring Provider (PT): Asencion [place], MD   Encounter Date: 12/23/2020   PT End of Session - 12/23/20 1747    Visit Number 10    Number of Visits 17    Date for PT Re-Evaluation 01/06/21    Authorization Type [employer]HC Medicare    PT Start Time 1400    PT Stop Time 7371    PT Time Calculation (min) 45 min    Equipment Utilized During Treatment Gait belt    Activity Tolerance Patient tolerated treatment well;Patient limited by fatigue    Behavior During Therapy Flat affect           Past Medical History:  Diagnosis Date  . 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 ([place]) 06/03/2019  . Cerebrovascular accident (CVA) due to occlusion of vertebral artery ([place])   . Diabetes mellitus without complication ([place])   . Dysphagia    post stroke  . Hypertension   . Stroke [facility])     Past Surgical History:  Procedure Laterality Date  . IR ANGIO INTRA EXTRACRAN SEL COM CAROTID INNOMINATE UNI L MOD SED  03/10/2019  . IR ANGIO VERTEBRAL SEL SUBCLAVIAN INNOMINATE BILAT MOD SED  03/10/2019  . IR CT HEAD LTD  03/10/2019  . IR PERCUTANEOUS ART THROMBECTOMY/INFUSION INTRACRANIAL INC DIAG ANGIO  03/10/2019  . RADIOLOGY WITH ANESTHESIA N/A 03/10/2019   Procedure: IR WITH ANESTHESIA/CODE STROKE;  Surgeon: Radiologist, Medication, MD;  Location: [place];  Service: Radiology;  Laterality: N/A;    There were no vitals filed for this visit.   Subjective Assessment - 12/23/20 1404    Subjective Doing okay.    Patient is accompained by: Family member    Limitations Walking     Patient Stated Goals Pt's goals for therapy are to walk better. (pt agrees with wife's statement)           Objective: TUG: 55 sec with FWW 5TSTS: 35 sec with BUE support                  [facility] Adult PT Treatment/Exercise - 12/23/20 0001      Ambulation/Gait   Gait Comments functional distances in clinic with FWW and tactile cues for spinal extension, fwd gaze      High Level Balance   High Level Balance Activities --   forward and backward wlaking with FWW.   High Level Balance Comments Marching in standing 2# AW 10 x2 alternating      Knee/Hip Exercises: Aerobic   Nustep L5 x 6 minutes          Standing hand slides up  Wall 10 x 2 B emphasis on upright posture.  Gait train with left AFO donned Sit to stand 10 x 2            PT Short Term Goals - 12/23/20 1748      PT SHORT TERM GOAL #1   Title Pt will perform HEP with wife's assistance, for improved strength, balance, gait.    Time 4    Period Weeks    Status On-going   wife reports difficulty  with getting Mr Bultman to do exercises at home            PT Long Term Goals - 12/23/20 1404      PT LONG TERM GOAL #1   Title Pt will perform progressive HEP with family supervision and assistance for improved balance, strength, gait.    Time 8    Period Weeks    Status On-going      PT LONG TERM GOAL #2   Title Pt will improve 5TSTS score to </= 20 seconds to demonstrate improved functional strength and decreased fall risk    Baseline Initial 54". 12/23/20: 35 seconds with arms of chair BUE support for push up    Time 8    Period Weeks    Status On-going      PT LONG TERM GOAL #3   Title Pt will improve TUG score to less than or equal to 25 seconds for decreased fall risk.    Baseline Initial 59 sec. 12/23/20: 55 seconds.    Time 8    Period Weeks    Status On-going                 Plan - 12/23/20 1425    Clinical Impression Statement Mr Ellen is making gradual progress towards  goals. He has made improvement in 5TSTS very close to meeting goal. TUG with minimal improvement as gait speed and speed with turns is still slow. Improving upright posture noted but not sustained without cues. Trialed gait with L AFO today to provide some tibial control and prevent left knee hyperextension with gait with slight improvement. He will benefit from continued skileld PT to improve functional strength, posture, mobility    Personal Factors and Comorbidities Comorbidity 3+    Comorbidities DM, HTN, CVA, dsyphagia    Examination-Activity Limitations Locomotion Level;Transfers;Stand;Stairs    Examination-Participation Restrictions Community Activity;Other    Rehab Potential Fair   cognitive impairments, decreased safety awareness, severity of deficits   PT Frequency 2x / week    PT Duration 8 weeks    PT Treatment/Interventions ADLs/Self Care Home Management;DME Instruction;Neuromuscular re-education;Balance training;Therapeutic exercise;Therapeutic activities;Functional mobility training;Stair training;Gait training;Patient/family education;Energy conservation;Manual techniques    PT Next Visit Plan Transfer training, gait training; wife education for safety with transfers and gait, consider QP positioning at some point progressing to tall kneeling and potentially 1/2 kneel with UE assist; postural upright, hard airex for pelvic neutral.  Can trial next visit with small wedge or towel roll under the left to see if we can kick in more trunk righting reactions and active WS left.    PT Home Exercise Plan Educated wife in verbal cues for tall upright posture, use of horizontal sacral towel roll to promote WB through ischial tuberosities and improve pelvic alignnet and posture in sitting    Consulted and Agree with Plan of Care Patient;Family member/caregiver    Family Member Consulted wife           Patient will benefit from skilled therapeutic intervention in order to improve the following  deficits and impairments:  Abnormal gait,Decreased coordination,Difficulty walking,Decreased safety awareness,Decreased balance,Decreased strength,Decreased mobility  Visit Diagnosis: Muscle weakness (generalized)  Ataxic gait  Abnormal posture  Other abnormalities of gait and mobility  History of falling  Unsteadiness on feet  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 [facility])     Problem List Patient Active Problem List   Diagnosis Date Noted  . Left hemiparesis ([place]) 12/06/2020  .  Benign essential HTN   . Cognitive deficit, post-stroke   . Controlled type 2 diabetes mellitus with hyperglycemia, without long-term current use of insulin ([place])   . 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 Dyslipidemia, goal LDL below 70 03/12/2019  . Dysphagia due to recent stroke 03/12/2019  . 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 Abnormality of gait   . Gait disturbance, post-stroke   . 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    Hall Busing, PT, DPT 12/23/2020, 5:49 PM  [place]. [place], [place], [address] Phone: [phone]   Fax:  [phone]  Name: WORTHINGTON CRUZAN Perry. MRN: [id number] Date of Birth: [birth year]

## 2020-12-23 NOTE — Therapy (Signed)
[place]. [place], [place], [address] Phone: [phone]   Fax:  [phone]  Speech Language Pathology Treatment  Patient Details  Name: Joe VANHORN Sr. MRN: [id number] Date of Birth: 06-22-1951 Referring Provider (SLP): Elsie Stain, MD   Encounter Date: 12/23/2020   End of Session - 12/23/20 1345    Visit Number 18    Number of Visits 25    Date for SLP Re-Evaluation 01/22/21    SLP Start Time 1315    SLP Stop Time  1358    SLP Time Calculation (min) 43 min    Activity Tolerance Patient tolerated treatment well;Patient limited by fatigue           Past Medical History:  Diagnosis Date  . 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 ([place]) 06/03/2019  . Cerebrovascular accident (CVA) due to occlusion of vertebral artery ([place])   . Diabetes mellitus without complication ([place])   . Dysphagia    post stroke  . Hypertension   . Stroke [facility])     Past Surgical History:  Procedure Laterality Date  . IR ANGIO INTRA EXTRACRAN SEL COM CAROTID INNOMINATE UNI L MOD SED  03/10/2019  . IR ANGIO VERTEBRAL SEL SUBCLAVIAN INNOMINATE BILAT MOD SED  03/10/2019  . IR CT HEAD LTD  03/10/2019  . IR PERCUTANEOUS ART THROMBECTOMY/INFUSION INTRACRANIAL INC DIAG ANGIO  03/10/2019  . RADIOLOGY WITH ANESTHESIA N/A 03/10/2019   Procedure: IR WITH ANESTHESIA/CODE STROKE;  Surgeon: Radiologist, Medication, MD;  Location: [facility];  Service: Radiology;  Laterality: N/A;    There were no vitals filed for this visit.          ADULT SLP TREATMENT - 12/23/20 1347      General Information   Behavior/Cognition Alert;Cooperative;Pleasant mood;Distractible;Decreased sustained attention      Treatment Provided   Treatment provided Cognitive-Linquistic      Cognitive-Linquistic Treatment   Treatment focused on Aphasia;Cognition    Skilled Treatment Trained pt's attention during sustained  attention task (sorting, puzzle). Required maxA. Facilitated verbal expression by assisting patient to name basic objects. SLP provided object and a verbal description, pt was able to name 5/10 objects independently (given extra time and description) and 10/10 items given a sentence completion cue.      Assessment / Recommendations / Plan   Plan Continue with current plan of care      Progression Toward Goals   Progression toward goals Progressing toward goals              SLP Short Term Goals - 12/23/20 1346      SLP SHORT TERM GOAL #1   Title Pt will verbalize yes/no when asked a question to increase verbal communication.    Time 3    Period Weeks    Status On-going   Pt is able to make a choice verbally, but may not correctly identify.     SLP SHORT TERM GOAL #2   Title Pt will follow through with swallowing saliva before speaking at home as reported by caregiver.    Baseline Swallows with rare cues in session.    Time 3    Period Weeks    Status On-going      SLP SHORT TERM GOAL #3   Title Pt will name 6/10 daily objects given phonemic cues to assist with making choices at home.    Baseline 3    Time 3  Period Weeks    Status On-going            SLP Long Term Goals - 12/23/20 1346      SLP LONG TERM GOAL #1   Title Client will utilize compensatory strategies to communicate wants and needs effectively to different conversational partners and participate socially in functional living environment.    Time 4    Period Weeks    Status On-going      SLP LONG TERM GOAL #2   Title Client will demonstrate understanding of verbal communication related to daily activities and utilize compensatory strategies to more effectively communicate in their functional living environment.    Time 4    Period Weeks    Status On-going      SLP LONG TERM GOAL #3   Title Patient will develop functional attention skills to effectively attend to and communicate in tasks of daily  living in their functional living environment.    Time 4    Period Weeks    Status On-going            Plan - 12/23/20 1346    Clinical Impression Statement Pt is a 70 yo male who presents with severe cognitive-communication impairment and dysphagia 2/2 to cognitive impairment. Pt wife accompanied pt and required encouragement to participate in answering questions regarding impairments seen at home. Pt required verbal and tactile cueing to swallow saliva before speaking. With cueing before each communicative interaction, pt was able to participate in today's evaluation verbally. Pt experienced significantly delayed initation in answering questions and following directions. Pt wife reported her primary concern is with his lack of communication of his needs/wants. She reports mostly mumbling at home with occasional greetings. SLUMS was conducted this date to determine extent of cognitive deficit. Patient required repetition, visual, and verbal cueing to complete. Patient received a 4/30 on today's assessment; however, pt demonstrated increased accuracy with verbal cueing and when provided with multiple choice cue. SLP rec skilled speech services to increase patient's ability to communicate needs/wants and further participation in ADLs to relieve caregiver burden.    Speech Therapy Frequency 2x / week    Duration 12 weeks    Treatment/Interventions Aspiration precaution training;Cueing hierarchy;Patient/family education;Functional tasks;Environmental controls;Cognitive reorganization;Multimodal communcation approach;Language facilitation;Compensatory techniques;Internal/external aids;SLP instruction and feedback;Compensatory strategies    Potential to Achieve Goals Fair    Potential Considerations Ability to learn/carryover information;Severity of impairments    Consulted and Agree with Plan of Care Patient;Family member/caregiver           Patient will benefit from skilled therapeutic  intervention in order to improve the following deficits and impairments:   Aphasia  Cognitive communication deficit    Problem List Patient Active Problem List   Diagnosis Date Noted  . Left hemiparesis ([place]) 12/06/2020  . Benign essential HTN   . Cognitive deficit, post-stroke   . Controlled type 2 diabetes mellitus with hyperglycemia, without long-term current use of insulin ([place])   . 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 Dyslipidemia, goal LDL below 70 03/12/2019  . Dysphagia due to recent stroke 03/12/2019  . 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 Abnormality of gait   . Gait disturbance, post-stroke   . 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    [name], [place], [name]  12/23/2020, 1:57 PM  [place]  [place]. [place], [place], [address] Phone: [phone]   Fax:  [phone]   Name: Joe ZANNI Sr. MRN: [id number] Date of Birth: October 16, 1950

## 2020-12-28 ENCOUNTER — Ambulatory Visit: Payer: Medicare Other

## 2020-12-28 ENCOUNTER — Other Ambulatory Visit: Payer: Self-pay

## 2020-12-28 DIAGNOSIS — Z9181 History of falling: Secondary | ICD-10-CM

## 2020-12-28 DIAGNOSIS — I6329 Cerebral infarction due to unspecified occlusion or stenosis of other precerebral arteries: Secondary | ICD-10-CM | POA: Diagnosis not present

## 2020-12-28 DIAGNOSIS — R260 Ataxic gait: Secondary | ICD-10-CM | POA: Diagnosis not present

## 2020-12-28 DIAGNOSIS — R2681 Unsteadiness on feet: Secondary | ICD-10-CM | POA: Diagnosis not present

## 2020-12-28 DIAGNOSIS — R4701 Aphasia: Secondary | ICD-10-CM | POA: Diagnosis not present

## 2020-12-28 DIAGNOSIS — R293 Abnormal posture: Secondary | ICD-10-CM | POA: Diagnosis not present

## 2020-12-28 DIAGNOSIS — R2689 Other abnormalities of gait and mobility: Secondary | ICD-10-CM

## 2020-12-28 DIAGNOSIS — M6281 Muscle weakness (generalized): Secondary | ICD-10-CM

## 2020-12-28 DIAGNOSIS — R41841 Cognitive communication deficit: Secondary | ICD-10-CM | POA: Diagnosis not present

## 2020-12-28 NOTE — Therapy (Signed)
[place]. [place], [place], [address] Phone: [phone]   Fax:  [phone]  Physical Therapy Treatment  Patient Details  Name: Joe MCCARDLE Sr. MRN: [id number] Date of Birth: May 31, 1951 Referring Provider (PT): Asencion Noble, MD   Encounter Date: 12/28/2020   PT End of Session - 12/28/20 1527    Visit Number 11    Number of Visits 17    Date for PT Re-Evaluation 01/06/21    Authorization Type [employer] Medicare    PT Start Time 1749    PT Stop Time 1615    PT Time Calculation (min) 45 min    Equipment Utilized During Treatment Gait belt    Activity Tolerance Patient tolerated treatment well;Patient limited by fatigue    Behavior During Therapy Flat affect           Past Medical History:  Diagnosis Date  . 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 ([place]) 06/03/2019  . Cerebrovascular accident (CVA) due to occlusion of vertebral artery ([place])   . Diabetes mellitus without complication ([place])   . Dysphagia    post stroke  . Hypertension   . Stroke [facility])     Past Surgical History:  Procedure Laterality Date  . IR ANGIO INTRA EXTRACRAN SEL COM CAROTID INNOMINATE UNI L MOD SED  03/10/2019  . IR ANGIO VERTEBRAL SEL SUBCLAVIAN INNOMINATE BILAT MOD SED  03/10/2019  . IR CT HEAD LTD  03/10/2019  . IR PERCUTANEOUS ART THROMBECTOMY/INFUSION INTRACRANIAL INC DIAG ANGIO  03/10/2019  . RADIOLOGY WITH ANESTHESIA N/A 03/10/2019   Procedure: IR WITH ANESTHESIA/CODE STROKE;  Surgeon: Radiologist, Medication, MD;  Location: [facility];  Service: Radiology;  Laterality: N/A;    There were no vitals filed for this visit.   Subjective Assessment - 12/28/20 1725    Subjective No new complaints    Patient is accompained by: Family member    Limitations Walking    Patient Stated Goals Pt's goals for therapy are to walk better. (pt agrees with wife's statement)    Currently in Pain? No/denies                              [facility] Adult PT Treatment/Exercise - 12/28/20 0001      Ambulation/Gait   Gait Comments longdistance ambulation within clinic x200 ft. Ambulation while visually scanning/searching for post its on wall, reaching up and across midline for postits/hand to PT.   all wth FWW     High Level Balance   High Level Balance Activities --   forward/ bwd walk with FWW. Side stepping  bars.   High Level Balance Comments Marching in standing 10 x 2 alternating (knees to PT hand) in bars      Knee/Hip Exercises: Aerobic   Nustep L5 x 6 min      Knee/Hip Exercises: Seated   Hamstring Curl Both;1 set;15 reps   green.   Sit to Sand 10 reps;2 sets   2nd set with above head reaches to PT hand unilateral                   PT Short Term Goals - 12/23/20 1748      PT SHORT TERM GOAL #1   Title Pt will perform HEP with wife's assistance, for improved strength, balance, gait.    Time 4    Period Weeks    Status On-going  wife reports difficulty with getting Mr Newstrom to do exercises at home            PT Long Term Goals - 12/23/20 1404      PT LONG TERM GOAL #1   Title Pt will perform progressive HEP with family supervision and assistance for improved balance, strength, gait.    Time 8    Period Weeks    Status On-going      PT LONG TERM GOAL #2   Title Pt will improve 5TSTS score to </= 20 seconds to demonstrate improved functional strength and decreased fall risk    Baseline Initial 54". 12/23/20: 35 seconds with arms of chair BUE support for push up    Time 8    Period Weeks    Status On-going      PT LONG TERM GOAL #3   Title Pt will improve TUG score to less than or equal to 25 seconds for decreased fall risk.    Baseline Initial 59 sec. 12/23/20: 55 seconds.    Time 8    Period Weeks    Status On-going                 Plan - 12/28/20 1527    Clinical Impression Statement Lily tolerated all exercises well today.  Emphasis of session today on working on gait speed -utilized alot of tactile cues and quick stretch at hip flexors. We did some long distance ambulation with searching and reaching for post its on wlal to work on upright postural strengthening and he tolerated it nicely, seems to be holding a more upright posture for longer. HS weakness more evident with quick knee hyperextension (L>R) with sit to stands today, alot of cues needed to prevent WS posterior against mat table. Continue to work on balance/strength, gait, functional mobility.    Personal Factors and Comorbidities Comorbidity 3+    Comorbidities DM, HTN, CVA, dsyphagia    Examination-Activity Limitations Locomotion Level;Transfers;Stand;Stairs    Examination-Participation Restrictions Community Activity;Other    Rehab Potential Fair   cognitive impairments, decreased safety awareness, severity of deficits   PT Frequency 2x / week    PT Duration 8 weeks    PT Treatment/Interventions ADLs/Self Care Home Management;DME Instruction;Neuromuscular re-education;Balance training;Therapeutic exercise;Therapeutic activities;Functional mobility training;Stair training;Gait training;Patient/family education;Energy conservation;Manual techniques    PT Next Visit Plan Transfer training, gait training; wife education for safety with transfers and gait, consider QP positioning at some point progressing to tall kneeling and potentially 1/2 kneel with UE assist; postural upright, hard airex for pelvic neutral.  Can trial next visit with small wedge or towel roll under the left to see if we can kick in more trunk righting reactions and active WS left.    PT Home Exercise Plan Educated wife in verbal cues for tall upright posture, use of horizontal sacral towel roll to promote WB through ischial tuberosities and improve pelvic alignnet and posture in sitting    Consulted and Agree with Plan of Care Patient;Family member/caregiver    Family Member Consulted wife            Patient will benefit from skilled therapeutic intervention in order to improve the following deficits and impairments:  Abnormal gait,Decreased coordination,Difficulty walking,Decreased safety awareness,Decreased balance,Decreased strength,Decreased mobility  Visit Diagnosis: Muscle weakness (generalized)  Ataxic gait  Abnormal posture  Other abnormalities of gait and mobility  Unsteadiness on feet  History of falling  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 ([place])  Problem List Patient Active Problem List   Diagnosis Date Noted  . Left hemiparesis ([place]) 12/06/2020  . Benign essential HTN   . Cognitive deficit, post-stroke   . Controlled type 2 diabetes mellitus with hyperglycemia, without long-term current use of insulin ([place])   . 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 Dyslipidemia, goal LDL below 70 03/12/2019  . Dysphagia due to recent stroke 03/12/2019  . 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 Abnormality of gait   . Gait disturbance, post-stroke   . 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    Hall Busing, PT, DPT 12/28/2020, 5:26 PM  [address]. [place], [place], [address] Phone: [phone]   Fax:  [phone]  Name: Joe RUDERMAN Sr. MRN: [id number] Date of Birth: 05/17/1951

## 2020-12-30 ENCOUNTER — Encounter: Payer: Self-pay | Admitting: Speech Pathology

## 2020-12-30 ENCOUNTER — Other Ambulatory Visit: Payer: Self-pay

## 2020-12-30 ENCOUNTER — Ambulatory Visit: Payer: Medicare Other | Admitting: Speech Pathology

## 2020-12-30 DIAGNOSIS — R2681 Unsteadiness on feet: Secondary | ICD-10-CM | POA: Diagnosis not present

## 2020-12-30 DIAGNOSIS — I6329 Cerebral infarction due to unspecified occlusion or stenosis of other precerebral arteries: Secondary | ICD-10-CM | POA: Diagnosis not present

## 2020-12-30 DIAGNOSIS — R41841 Cognitive communication deficit: Secondary | ICD-10-CM | POA: Diagnosis not present

## 2020-12-30 DIAGNOSIS — Z9181 History of falling: Secondary | ICD-10-CM | POA: Diagnosis not present

## 2020-12-30 DIAGNOSIS — M6281 Muscle weakness (generalized): Secondary | ICD-10-CM | POA: Diagnosis not present

## 2020-12-30 DIAGNOSIS — R260 Ataxic gait: Secondary | ICD-10-CM | POA: Diagnosis not present

## 2020-12-30 DIAGNOSIS — R293 Abnormal posture: Secondary | ICD-10-CM | POA: Diagnosis not present

## 2020-12-30 DIAGNOSIS — R2689 Other abnormalities of gait and mobility: Secondary | ICD-10-CM | POA: Diagnosis not present

## 2020-12-30 DIAGNOSIS — R4701 Aphasia: Secondary | ICD-10-CM | POA: Diagnosis not present

## 2020-12-30 NOTE — Therapy (Signed)
[address]. [place], [place], [address] Phone: [phone]   Fax:  [phone]  Speech Language Pathology Treatment  Patient Details  Name: Joe Perry Sr. MRN: [id number] Date of Birth: 1950-12-27 Referring Provider (SLP): Elsie Stain, MD   Encounter Date: 12/30/2020   End of Session - 12/30/20 1405    Visit Number 19    Number of Visits 25    Date for SLP Re-Evaluation 01/22/21    SLP Start Time 25    SLP Stop Time  1440    SLP Time Calculation (min) 40 min    Activity Tolerance Patient tolerated treatment well;Patient limited by fatigue           Past Medical History:  Diagnosis Date  . 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 ([place]) 06/03/2019  . Cerebrovascular accident (CVA) due to occlusion of vertebral artery ([place])   . Diabetes mellitus without complication ([place])   . Dysphagia    post stroke  . Hypertension   . Stroke [facility])     Past Surgical History:  Procedure Laterality Date  . IR ANGIO INTRA EXTRACRAN SEL COM CAROTID INNOMINATE UNI L MOD SED  03/10/2019  . IR ANGIO VERTEBRAL SEL SUBCLAVIAN INNOMINATE BILAT MOD SED  03/10/2019  . IR CT HEAD LTD  03/10/2019  . IR PERCUTANEOUS ART THROMBECTOMY/INFUSION INTRACRANIAL INC DIAG ANGIO  03/10/2019  . RADIOLOGY WITH ANESTHESIA N/A 03/10/2019   Procedure: IR WITH ANESTHESIA/CODE STROKE;  Surgeon: Radiologist, Medication, MD;  Location: [facility];  Service: Radiology;  Laterality: N/A;    There were no vitals filed for this visit.   Subjective Assessment - 12/30/20 1404    Subjective Pt smiled and waved to SLP today.    Currently in Pain? No/denies                 ADULT SLP TREATMENT - 12/30/20 1429      General Information   Behavior/Cognition Alert;Cooperative;Pleasant mood;Distractible;Decreased sustained attention      Treatment Provided   Treatment provided Cognitive-Linquistic       Cognitive-Linquistic Treatment   Treatment focused on Aphasia;Cognition    Skilled Treatment Trained pt's attention and language skills during a peg activity. Pt was instructed to answer yes/no questions during peg activity. Pt demonstrated difficulty with colors; however, was able to complete yes/no task when SLP asked pt, "Does this board have 3 pegs? Yes or no?" given a visual prompt (yes/no). He answered 70% of questions correctly. Required additional cueing for visual scanning.      Progression Toward Goals   Progression toward goals Progressing toward goals              SLP Short Term Goals - 12/30/20 1406      SLP SHORT TERM GOAL #1   Title Pt will verbalize yes/no when asked a question to increase verbal communication.    Time 3    Period Weeks    Status On-going   Pt is able to make a choice verbally, but may not correctly identify.     SLP SHORT TERM GOAL #2   Title Pt will follow through with swallowing saliva before speaking at home as reported by caregiver.    Baseline Swallows with rare cues in session.    Time 3    Period Weeks    Status On-going      SLP SHORT TERM GOAL #3   Title Pt will name  6/10 daily objects given phonemic cues to assist with making choices at home.    Baseline 3    Time 3    Period Weeks    Status On-going            SLP Long Term Goals - 12/30/20 1407      SLP LONG TERM GOAL #1   Title Client will utilize compensatory strategies to communicate wants and needs effectively to different conversational partners and participate socially in functional living environment.    Time 4    Period Weeks    Status On-going      SLP LONG TERM GOAL #2   Title Client will demonstrate understanding of verbal communication related to daily activities and utilize compensatory strategies to more effectively communicate in their functional living environment.    Time 4    Period Weeks    Status On-going      SLP LONG TERM GOAL #3   Title Patient  will develop functional attention skills to effectively attend to and communicate in tasks of daily living in their functional living environment.    Time 4    Period Weeks    Status On-going            Plan - 12/30/20 1405    Clinical Impression Statement Pt continues to make incremental progress in speech therapy sessions. He has is able to produce more words verbally; however, continues to require verbal cueing to answer. He is able to follow basic directions. Attention continues to be his biggest barrier.  SLP rec skilled speech services to increase patient's ability to communicate needs/wants and further participation in ADLs to relieve caregiver burden.    Speech Therapy Frequency 2x / week    Duration 12 weeks    Treatment/Interventions Aspiration precaution training;Cueing hierarchy;Patient/family education;Functional tasks;Environmental controls;Cognitive reorganization;Multimodal communcation approach;Language facilitation;Compensatory techniques;Internal/external aids;SLP instruction and feedback;Compensatory strategies    Potential to Achieve Goals Fair    Potential Considerations Ability to learn/carryover information;Severity of impairments    Consulted and Agree with Plan of Care Patient;Family member/caregiver           Patient will benefit from skilled therapeutic intervention in order to improve the following deficits and impairments:   Aphasia  Cognitive communication deficit    Problem List Patient Active Problem List   Diagnosis Date Noted  . Left hemiparesis ([place]) 12/06/2020  . Benign essential HTN   . Cognitive deficit, post-stroke   . Controlled type 2 diabetes mellitus with hyperglycemia, without long-term current use of insulin ([place])   . 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 Dyslipidemia, goal LDL below 70 03/12/2019  . Dysphagia due to recent stroke 03/12/2019  . Stroke (cerebrum) ([name]) - R PCA s/p  mechanical thrombectomy, d/t large vessel dz 03/10/2019  . 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 Abnormality of gait   . Gait disturbance, post-stroke   . 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    Joe Perry, [place], [name]  12/30/2020, 2:37 PM  [address] [place], [place], [address] Phone: [phone]   Fax:  [phone]   Name: ANDREAS SOBOLEWSKI Sr. MRN: [id number] Date of Birth: 04/25/1951

## 2021-01-05 ENCOUNTER — Encounter: Payer: Self-pay | Admitting: Physical Therapy

## 2021-01-05 ENCOUNTER — Other Ambulatory Visit: Payer: Self-pay

## 2021-01-05 ENCOUNTER — Ambulatory Visit: Payer: Medicare Other | Admitting: Speech Pathology

## 2021-01-05 ENCOUNTER — Ambulatory Visit: Payer: Medicare Other | Attending: Critical Care Medicine | Admitting: Physical Therapy

## 2021-01-05 ENCOUNTER — Encounter: Payer: Self-pay | Admitting: Speech Pathology

## 2021-01-05 DIAGNOSIS — R260 Ataxic gait: Secondary | ICD-10-CM | POA: Diagnosis not present

## 2021-01-05 DIAGNOSIS — R41841 Cognitive communication deficit: Secondary | ICD-10-CM | POA: Insufficient documentation

## 2021-01-05 DIAGNOSIS — R293 Abnormal posture: Secondary | ICD-10-CM | POA: Insufficient documentation

## 2021-01-05 DIAGNOSIS — R4701 Aphasia: Secondary | ICD-10-CM | POA: Insufficient documentation

## 2021-01-05 DIAGNOSIS — M6281 Muscle weakness (generalized): Secondary | ICD-10-CM | POA: Diagnosis not present

## 2021-01-05 DIAGNOSIS — R2681 Unsteadiness on feet: Secondary | ICD-10-CM | POA: Insufficient documentation

## 2021-01-05 DIAGNOSIS — I6329 Cerebral infarction due to unspecified occlusion or stenosis of other precerebral arteries: Secondary | ICD-10-CM | POA: Insufficient documentation

## 2021-01-05 DIAGNOSIS — Z9181 History of falling: Secondary | ICD-10-CM | POA: Diagnosis not present

## 2021-01-05 DIAGNOSIS — R2689 Other abnormalities of gait and mobility: Secondary | ICD-10-CM | POA: Insufficient documentation

## 2021-01-05 NOTE — Therapy (Signed)
[place]. [place], [place], [address] Phone: [phone]   Fax:  [phone]  Speech Language Pathology Treatment  Patient Details  Name: Joe BOUWENS Sr. MRN: [id number] Date of Birth: 1951/02/24 Referring Provider (SLP): Elsie Stain, MD   Encounter Date: 01/05/2021   End of Session - 01/05/21 1538    Visit Number 20    Number of Visits 25    Date for SLP Re-Evaluation 01/22/21    SLP Start Time 1533    SLP Stop Time  1613    SLP Time Calculation (min) 40 min    Activity Tolerance Patient tolerated treatment well;Patient limited by fatigue           Past Medical History:  Diagnosis Date  . 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 ([place]) 06/03/2019  . Cerebrovascular accident (CVA) due to occlusion of vertebral artery ([place])   . Diabetes mellitus without complication ([place])   . Dysphagia    post stroke  . Hypertension   . Stroke [facility])     Past Surgical History:  Procedure Laterality Date  . IR ANGIO INTRA EXTRACRAN SEL COM CAROTID INNOMINATE UNI L MOD SED  03/10/2019  . IR ANGIO VERTEBRAL SEL SUBCLAVIAN INNOMINATE BILAT MOD SED  03/10/2019  . IR CT HEAD LTD  03/10/2019  . IR PERCUTANEOUS ART THROMBECTOMY/INFUSION INTRACRANIAL INC DIAG ANGIO  03/10/2019  . RADIOLOGY WITH ANESTHESIA N/A 03/10/2019   Procedure: IR WITH ANESTHESIA/CODE STROKE;  Surgeon: Radiologist, Medication, MD;  Location: [facility];  Service: Radiology;  Laterality: N/A;    There were no vitals filed for this visit.   Subjective Assessment - 01/05/21 1536    Subjective Pt verbalized he was not tired and was in no pain w/ encouragement.    Currently in Pain? No/denies                 ADULT SLP TREATMENT - 01/05/21 1611      General Information   Behavior/Cognition Alert;Cooperative;Pleasant mood;Distractible;Decreased sustained attention      Treatment Provided   Treatment  provided Cognitive-Linquistic      Cognitive-Linquistic Treatment   Treatment focused on Aphasia;Cognition    Skilled Treatment Facilitated verbal expression by asking basic yes/no questions with supporting visuals. Pt was able to answer yes/no questions giving prompting. He required maxA to begin, but once pt initiated verbalizations, pt was able to answer yes or no in 10/12  scenarios. 6/10 of those answers were correct. Trained pt's family on homework.      Assessment / Recommendations / Plan   Plan Continue with current plan of care      Progression Toward Goals   Progression toward goals Progressing toward goals              SLP Short Term Goals - 01/05/21 1539      SLP SHORT TERM GOAL #1   Title Pt will verbalize yes/no when asked a question to increase verbal communication.    Time 2    Period Weeks    Status On-going   Pt is able to make a choice verbally, but may not correctly identify.     SLP SHORT TERM GOAL #2   Title Pt will follow through with swallowing saliva before speaking at home as reported by caregiver.    Baseline Swallows with rare cues in session.    Time 2    Period Weeks    Status On-going  SLP SHORT TERM GOAL #3   Title Pt will name 6/10 daily objects given phonemic cues to assist with making choices at home.    Baseline 3    Time 2    Period Weeks    Status Achieved            SLP Long Term Goals - 01/05/21 1539      SLP LONG TERM GOAL #1   Title Client will utilize compensatory strategies to communicate wants and needs effectively to different conversational partners and participate socially in functional living environment.    Time 3    Period Weeks    Status On-going      SLP LONG TERM GOAL #2   Title Client will demonstrate understanding of verbal communication related to daily activities and utilize compensatory strategies to more effectively communicate in their functional living environment.    Time 3    Period Weeks     Status On-going      SLP LONG TERM GOAL #3   Title Patient will develop functional attention skills to effectively attend to and communicate in tasks of daily living in their functional living environment.    Time 3    Period Weeks    Status On-going            Plan - 01/05/21 1538    Clinical Impression Statement Pt continues to make incremental progress in speech therapy sessions. He has is able to produce more words verbally; however, continues to require verbal cueing to answer. He is able to follow basic directions. Attention continues to be his biggest barrier.  SLP rec skilled speech services to increase patient's ability to communicate needs/wants and further participation in ADLs to relieve caregiver burden.    Speech Therapy Frequency 2x / week    Duration 12 weeks    Treatment/Interventions Aspiration precaution training;Cueing hierarchy;Patient/family education;Functional tasks;Environmental controls;Cognitive reorganization;Multimodal communcation approach;Language facilitation;Compensatory techniques;Internal/external aids;SLP instruction and feedback;Compensatory strategies    Potential to Achieve Goals Fair    Potential Considerations Ability to learn/carryover information;Severity of impairments    Consulted and Agree with Plan of Care Patient;Family member/caregiver           Patient will benefit from skilled therapeutic intervention in order to improve the following deficits and impairments:   Aphasia  Cognitive communication deficit    Problem List Patient Active Problem List   Diagnosis Date Noted  . Left hemiparesis ([place]) 12/06/2020  . Benign essential HTN   . Cognitive deficit, post-stroke   . Controlled type 2 diabetes mellitus with hyperglycemia, without long-term current use of insulin ([place])   . 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 Dyslipidemia, goal LDL below 70 03/12/2019  . Dysphagia due to recent  stroke 03/12/2019  . 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 Abnormality of gait   . Gait disturbance, post-stroke   . 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   Speech Therapy Progress Note  Dates of Reporting Period: 10/25/20 to 01/22/21  Objective Reports of Subjective Statement: Pt continues to make incremental progress in the therapy room. He continues to require assistance to initiate, but now he is able to attend longer and complete tasks once he initiates.   Objective Measurements: 01/05/21: Named 8/12 objects given a phonemic cue; verbalized yes/no when being asked questions in 10/12 attempts  Goal Update: See goals  Plan: Continue services as medically  warranted  Reason Skilled Services are Required: SLP rec skilled services to increase patient's ability to verbalize needs and wants with support from wife.    Rosann Auerbach [facility] MS, [place], [name]  01/05/2021, 4:14 PM  [place]. [place], [place], [address] Phone: [phone]   Fax:  [phone]   Name: Joe DELVECCHIO Sr. MRN: [id number] Date of Birth: 1950-10-18

## 2021-01-05 NOTE — Therapy (Signed)
[place]. [place], [place], [address] Phone: [phone]   Fax:  [phone]  Physical Therapy Treatment  Patient Details  Name: Joe ALBORNOZ Sr. MRN: [id number] Date of Birth: [birth year] Referring Provider (PT): Asencion Noble, MD   Encounter Date: 01/05/2021   PT End of Session - 01/05/21 1531    Visit Number 12    Date for PT Re-Evaluation 02/07/21    Authorization Type UHC Medicare    PT Start Time 3299    PT Stop Time 1530    PT Time Calculation (min) 47 min    Activity Tolerance Patient tolerated treatment well;Patient limited by fatigue    Behavior During Therapy Flat affect           Past Medical History:  Diagnosis Date  . 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 ([place]) 06/03/2019  . Cerebrovascular accident (CVA) due to occlusion of vertebral artery ([place])   . Diabetes mellitus without complication ([place])   . Dysphagia    post stroke  . Hypertension   . Stroke [facility])     Past Surgical History:  Procedure Laterality Date  . IR ANGIO INTRA EXTRACRAN SEL COM CAROTID INNOMINATE UNI L MOD SED  03/10/2019  . IR ANGIO VERTEBRAL SEL SUBCLAVIAN INNOMINATE BILAT MOD SED  03/10/2019  . IR CT HEAD LTD  03/10/2019  . IR PERCUTANEOUS ART THROMBECTOMY/INFUSION INTRACRANIAL INC DIAG ANGIO  03/10/2019  . RADIOLOGY WITH ANESTHESIA N/A 03/10/2019   Procedure: IR WITH ANESTHESIA/CODE STROKE;  Surgeon: Radiologist, Medication, MD;  Location: [facility];  Service: Radiology;  Laterality: N/A;    There were no vitals filed for this visit.   Subjective Assessment - 01/05/21 1457    Subjective Wife reports no falls, reports that she has to take him to the bathroom              [facility] PT Assessment - 01/05/21 0001      Timed Up and Go Test   Normal TUG (seconds) 56    TUG Comments with FWW                         [facility] Adult PT Treatment/Exercise - 01/05/21  0001      Ambulation/Gait   Gait Comments walking with FWW, cues for posture and some step length as well as cues for posture.  HHA walk trying to get bigger/faster step length, cues for posture, some tactile cues for weight shift      High Level Balance   High Level Balance Activities Side stepping    High Level Balance Comments Marching in standing 10 x 2 alternating (knees to PT hand) in bars, standing in the Pbars cone reaching and stacking with him following directions, seated ball toss, standing ball toss with close CGA, tried standing on the airex, CGA and a few times mod A due to LOB, 6" toe touches with FWW      Knee/Hip Exercises: Standing   Other Standing Knee Exercises back to wall overhead reach working on posture and stability of the posterior mms      Knee/Hip Exercises: Seated   Other Seated Knee/Hip Exercises seated alternating ball kicks                    PT Short Term Goals - 12/23/20 1748      PT SHORT TERM GOAL #1   Title Pt will  perform HEP with wife's assistance, for improved strength, balance, gait.    Time 4    Period Weeks    Status On-going   wife reports difficulty with getting Mr Kober to do exercises at home            PT Long Term Goals - 01/05/21 1540      PT LONG TERM GOAL #1   Title Pt will perform progressive HEP with family supervision and assistance for improved balance, strength, gait.    Status On-going      PT LONG TERM GOAL #2   Title Pt will improve 5TSTS score to </= 20 seconds to demonstrate improved functional strength and decreased fall risk    Status On-going      PT LONG TERM GOAL #3   Title Pt will improve TUG score to less than or equal to 25 seconds for decreased fall risk.    Status On-going                 Plan - 01/05/21 1536    Clinical Impression Statement OVerall Mr. Barkdull is progressing with his ability to get up from sitting, he balance and his overall function, he is limited with slow gait  speed and walker negotiation.  I added some different things today to challenge him.  Posture is difficult for him as he will lose his balance when trying to look up, On the airex he demonstrated the ankle strategy, I did not see hip strategy but it could be because his trunk was very flexed, he did not demonstrate a step strategy.  With the standing ball toss, for the most part I was able to be hands off, he would catch himself with ankle strategy with loss of balance, a few times needed mod A to right due to LOB.    PT Frequency 1x / week    PT Duration 4 weeks    PT Treatment/Interventions ADLs/Self Care Home Management;DME Instruction;Neuromuscular re-education;Balance training;Therapeutic exercise;Therapeutic activities;Functional mobility training;Stair training;Gait training;Patient/family education;Energy conservation;Manual techniques    PT Next Visit Plan Transfer training, gait training; wife education for safety with transfers and gait, consider QP positioning at some point progressing to tall kneeling and potentially 1/2 kneel with UE assist; postural upright, hard airex for pelvic neutral.  Can trial next visit with small wedge or towel roll under the left to see if we can kick in more trunk righting reactions and active WS left.    Consulted and Agree with Plan of Care Patient;Family member/caregiver           Patient will benefit from skilled therapeutic intervention in order to improve the following deficits and impairments:  Abnormal gait,Decreased coordination,Difficulty walking,Decreased safety awareness,Decreased balance,Decreased strength,Decreased mobility  Visit Diagnosis: Muscle weakness (generalized) - Plan: PT plan of care cert/re-cert  Ataxic gait - Plan: PT plan of care cert/re-cert  Abnormal posture - Plan: PT plan of care cert/re-cert  Other abnormalities of gait and mobility - Plan: PT plan of care cert/re-cert  Unsteadiness on feet - Plan: PT plan of care  cert/re-cert  History of falling - Plan: PT plan of care cert/re-cert  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 ([place]) - Plan: PT plan of care cert/re-cert     Problem List Patient Active Problem List   Diagnosis Date Noted  . Left hemiparesis ([place]) 12/06/2020  . Benign essential HTN   . Cognitive deficit, post-stroke   . Controlled type 2 diabetes mellitus  with hyperglycemia, without long-term current use of insulin ([place])   . 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 Dyslipidemia, goal LDL below 70 03/12/2019  . Dysphagia due to recent stroke 03/12/2019  . 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 Abnormality of gait   . Gait disturbance, post-stroke   . 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    Sumner Boast., PT 01/05/2021, 3:43 PM  [place]. [place], [place], [address] Phone: [phone]   Fax:  [phone]  Name: Joe MICHALOWSKI Sr. MRN: [id number] Date of Birth: Jan 27, 1951

## 2021-01-06 ENCOUNTER — Ambulatory Visit: Payer: Medicare Other

## 2021-01-06 ENCOUNTER — Encounter: Payer: Self-pay | Admitting: Speech Pathology

## 2021-01-06 ENCOUNTER — Ambulatory Visit: Payer: Medicare Other | Admitting: Speech Pathology

## 2021-01-06 DIAGNOSIS — I6329 Cerebral infarction due to unspecified occlusion or stenosis of other precerebral arteries: Secondary | ICD-10-CM

## 2021-01-06 DIAGNOSIS — R2689 Other abnormalities of gait and mobility: Secondary | ICD-10-CM | POA: Diagnosis not present

## 2021-01-06 DIAGNOSIS — R41841 Cognitive communication deficit: Secondary | ICD-10-CM

## 2021-01-06 DIAGNOSIS — R4701 Aphasia: Secondary | ICD-10-CM | POA: Diagnosis not present

## 2021-01-06 DIAGNOSIS — Z9181 History of falling: Secondary | ICD-10-CM | POA: Diagnosis not present

## 2021-01-06 DIAGNOSIS — R2681 Unsteadiness on feet: Secondary | ICD-10-CM

## 2021-01-06 DIAGNOSIS — M6281 Muscle weakness (generalized): Secondary | ICD-10-CM | POA: Diagnosis not present

## 2021-01-06 DIAGNOSIS — R260 Ataxic gait: Secondary | ICD-10-CM

## 2021-01-06 DIAGNOSIS — R293 Abnormal posture: Secondary | ICD-10-CM

## 2021-01-06 NOTE — Therapy (Signed)
[place]. [place], [place], [address] Phone: [phone]   Fax:  [phone]  Physical Therapy Treatment  Patient Details  Name: Joe DORIAN Sr. MRN: [id number] Date of Birth: 26-Jul-1951 Referring Provider (PT): Asencion Noble, MD   Encounter Date: 01/06/2021   PT End of Session - 01/06/21 1538    Visit Number 13    Date for PT Re-Evaluation 02/07/21    Authorization Type UHC Medicare    PT Start Time 6962    PT Stop Time 1615    PT Time Calculation (min) 45 min    Equipment Utilized During Treatment Gait belt    Activity Tolerance Patient tolerated treatment well;Patient limited by fatigue    Behavior During Therapy Flat affect           Past Medical History:  Diagnosis Date  . 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 ([place]) 06/03/2019  . Cerebrovascular accident (CVA) due to occlusion of vertebral artery ([place])   . Diabetes mellitus without complication ([place])   . Dysphagia    post stroke  . Hypertension   . Stroke [facility])     Past Surgical History:  Procedure Laterality Date  . IR ANGIO INTRA EXTRACRAN SEL COM CAROTID INNOMINATE UNI L MOD SED  03/10/2019  . IR ANGIO VERTEBRAL SEL SUBCLAVIAN INNOMINATE BILAT MOD SED  03/10/2019  . IR CT HEAD LTD  03/10/2019  . IR PERCUTANEOUS ART THROMBECTOMY/INFUSION INTRACRANIAL INC DIAG ANGIO  03/10/2019  . RADIOLOGY WITH ANESTHESIA N/A 03/10/2019   Procedure: IR WITH ANESTHESIA/CODE STROKE;  Surgeon: Radiologist, Medication, MD;  Location: [facility];  Service: Radiology;  Laterality: N/A;    There were no vitals filed for this visit.   Subjective Assessment - 01/06/21 1538    Subjective No falls. feet more swollen today so wore canvas strapped sandals    Patient is accompained by: Family member   [name] Adult PT Treatment/Exercise - 01/06/21 0001      Ambulation/Gait   Gait Comments walking  with FWW, cues for posture and some step length as well as cues for posture.    long distance walking while scanning for post its to get upright trunk and upward gaze.    Walking in  bars fwd and bwd 2 UE support vs 1 UE support.   Sidestepping in  bars.  Reaching above head to PT hand 10 x 2 B standing in  bars     Therapeutic Activites    Other Therapeutic Activities Seated anterior WS reach for cone on floor and place in PT hand above head reaching fwd x 10 B      Knee/Hip Exercises: Aerobic   Nustep L5 x 6 min      Knee/Hip Exercises: Standing   Forward Step Up Both;1 set;10 reps;Hand Hold: 2;Step Height: 4"   tactile and verbal cues for upright posture with each step up                   PT Short Term Goals - 12/23/20 1748      PT SHORT TERM GOAL #1   Title Pt will perform HEP with wife's assistance, for improved strength, balance, gait.    Time 4    Period Weeks    Status On-going   wife reports difficulty with getting [name] to do exercises at  home            PT Long Term Goals - 01/05/21 1540      PT LONG TERM GOAL #1   Title Pt will perform progressive HEP with family supervision and assistance for improved balance, strength, gait.    Status On-going      PT LONG TERM GOAL #2   Title Pt will improve 5TSTS score to </= 20 seconds to demonstrate improved functional strength and decreased fall risk    Status On-going      PT LONG TERM GOAL #3   Title Pt will improve TUG score to less than or equal to 25 seconds for decreased fall risk.    Status On-going                 Plan - 01/06/21 1539    Clinical Impression Statement Tolerated todays session well.  BP assessed and 143/85, HR and O2 stable throughout session. Otherwise no c/o any symptoms and stable during session, tolerating all exercise progressions well. We were able to do some wlaking in  bars with only 1 UE support and CGA for upright. Edcuated wife in contacting MD  regarding worsened BLE edema.    PT Frequency 1x / week    PT Duration 4 weeks    PT Treatment/Interventions ADLs/Self Care Home Management;DME Instruction;Neuromuscular re-education;Balance training;Therapeutic exercise;Therapeutic activities;Functional mobility training;Stair training;Gait training;Patient/family education;Energy conservation;Manual techniques    PT Next Visit Plan Transfer training, gait training; wife education for safety with transfers and gait, consider QP positioning at some point progressing to tall kneeling and potentially 1/2 kneel with UE assist; postural upright, hard airex for pelvic neutral.  Can trial next visit with small wedge or towel roll under the left to see if we can kick in more trunk righting reactions and active WS left.    Consulted and Agree with Plan of Care Patient;Family member/caregiver           Patient will benefit from skilled therapeutic intervention in order to improve the following deficits and impairments:  Abnormal gait,Decreased coordination,Difficulty walking,Decreased safety awareness,Decreased balance,Decreased strength,Decreased mobility  Visit Diagnosis: Muscle weakness (generalized)  Ataxic gait  Abnormal posture  Other abnormalities of gait and mobility  Unsteadiness on feet  History of falling  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 [facility])     Problem List Patient Active Problem List   Diagnosis Date Noted  . Left hemiparesis ([place]) 12/06/2020  . Benign essential HTN   . Cognitive deficit, post-stroke   . Controlled type 2 diabetes mellitus with hyperglycemia, without long-term current use of insulin ([place])   . 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 Dyslipidemia, goal LDL below 70 03/12/2019  . Dysphagia due to recent stroke 03/12/2019  . 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, d/t large vessel dz  03/10/2019  . 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 Abnormality of gait   . Gait disturbance, post-stroke   . 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    Hall Busing, PT, DPT 01/06/2021, 4:28 PM  [place]. [place], [place], [address] Phone: [phone]   Fax:  [phone]  Name: CLIDE REMMERS Sr. MRN: [id number] Date of Birth: December 28, 1950

## 2021-01-06 NOTE — Therapy (Signed)
[place] [place], [place], [address] Phone: [phone]   Fax:  [phone]  Speech Language Pathology Treatment  Patient Details  Name: Joe GLAZE Sr. MRN: [id number] Date of Birth: 12/09/1950 Referring Provider (SLP): Elsie Stain, MD   Encounter Date: 01/06/2021   End of Session - 01/06/21 1644    Visit Number 21    Number of Visits 25    Date for SLP Re-Evaluation 01/22/21    SLP Start Time 1615    SLP Stop Time  1658    SLP Time Calculation (min) 43 min    Activity Tolerance Patient tolerated treatment well;Patient limited by fatigue           Past Medical History:  Diagnosis Date  . 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 ([place]) 06/03/2019  . Cerebrovascular accident (CVA) due to occlusion of vertebral artery ([place])   . Diabetes mellitus without complication ([place])   . Dysphagia    post stroke  . Hypertension   . Stroke [facility])     Past Surgical History:  Procedure Laterality Date  . IR ANGIO INTRA EXTRACRAN SEL COM CAROTID INNOMINATE UNI L MOD SED  03/10/2019  . IR ANGIO VERTEBRAL SEL SUBCLAVIAN INNOMINATE BILAT MOD SED  03/10/2019  . IR CT HEAD LTD  03/10/2019  . IR PERCUTANEOUS ART THROMBECTOMY/INFUSION INTRACRANIAL INC DIAG ANGIO  03/10/2019  . RADIOLOGY WITH ANESTHESIA N/A 03/10/2019   Procedure: IR WITH ANESTHESIA/CODE STROKE;  Surgeon: Radiologist, Medication, MD;  Location: [facility];  Service: Radiology;  Laterality: N/A;    There were no vitals filed for this visit.   Subjective Assessment - 01/06/21 1617    Subjective PT reported that patient exhibited some swelling in feet. PT took blood pressure (140/85). SLP and PT spoke to pt's wife and told her to reach out to doctor.    Patient is accompained by: Family member    Currently in Pain? No/denies                 ADULT SLP TREATMENT - 01/06/21 1623      General Information    Behavior/Cognition Alert;Cooperative;Pleasant mood;Distractible;Decreased sustained attention      Treatment Provided   Treatment provided Cognitive-Linquistic      Cognitive-Linquistic Treatment   Treatment focused on Aphasia;Cognition    Skilled Treatment Went over homework with patient. He received an 11/12 on the yes/no questions; however, got 12/12 when gone over again. SLP modified task for visual deficit. Benefits from vertical placement of photos vs. horizontal due to L visual field impairment. Had patient organize verbally given items into fruit vs vegetable - pt was 70% correct. Facilitated sustained and visual attention by completing an exercise that involved pointing to the correct number.      Assessment / Recommendations / Plan   Plan Continue with current plan of care      Progression Toward Goals   Progression toward goals Progressing toward goals              SLP Short Term Goals - 01/06/21 1650      SLP SHORT TERM GOAL #1   Title Pt will verbalize yes/no when asked a question to increase verbal communication.    Time 2    Period Weeks    Status On-going   Pt is able to make a choice verbally, but may not correctly identify.     SLP SHORT TERM GOAL #2  Title Pt will follow through with swallowing saliva before speaking at home as reported by caregiver.    Baseline Swallows with rare cues in session.    Time 2    Period Weeks    Status On-going      SLP SHORT TERM GOAL #3   Title Pt will name 6/10 daily objects given phonemic cues to assist with making choices at home.    Baseline 3    Time 2    Period Weeks    Status Achieved            SLP Long Term Goals - 01/06/21 1651      SLP LONG TERM GOAL #1   Title Client will utilize compensatory strategies to communicate wants and needs effectively to different conversational partners and participate socially in functional living environment.    Time 3    Period Weeks    Status On-going      SLP LONG  TERM GOAL #2   Title Client will demonstrate understanding of verbal communication related to daily activities and utilize compensatory strategies to more effectively communicate in their functional living environment.    Time 3    Period Weeks    Status On-going      SLP LONG TERM GOAL #3   Title Patient will develop functional attention skills to effectively attend to and communicate in tasks of daily living in their functional living environment.    Time 3    Period Weeks    Status On-going            Plan - 01/06/21 1647    Clinical Impression Statement Pt wife reports that she helped patient with homework and she felt like he did well. Pt still unoriented to month, but was able to recall with spaced retrieval post 2 min delay.SLP rec skilled speech services to increase patient's ability to communicate needs/wants and further participation in ADLs to relieve caregiver burden.    Speech Therapy Frequency 2x / week    Duration 12 weeks    Treatment/Interventions Aspiration precaution training;Cueing hierarchy;Patient/family education;Functional tasks;Environmental controls;Cognitive reorganization;Multimodal communcation approach;Language facilitation;Compensatory techniques;Internal/external aids;SLP instruction and feedback;Compensatory strategies    Potential to Achieve Goals Fair    Potential Considerations Ability to learn/carryover information;Severity of impairments    Consulted and Agree with Plan of Care Patient;Family member/caregiver           Patient will benefit from skilled therapeutic intervention in order to improve the following deficits and impairments:   Aphasia  Cognitive communication deficit    Problem List Patient Active Problem List   Diagnosis Date Noted  . Left hemiparesis ([place]) 12/06/2020  . Benign essential HTN   . Cognitive deficit, post-stroke   . Controlled type 2 diabetes mellitus with hyperglycemia, without long-term current use of insulin  ([place])   . 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 Dyslipidemia, goal LDL below 70 03/12/2019  . Dysphagia due to recent stroke 03/12/2019  . 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 Abnormality of gait   . Gait disturbance, post-stroke   . 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    [name], [place], [name]  01/06/2021, 4:55 PM  [place]. [place], [place], [address] Phone: [phone]   Fax:  [phone]   Name: Joe OHLRICH Sr. MRN: [id number] Date of  Birth: [birth year]

## 2021-01-11 ENCOUNTER — Ambulatory Visit: Payer: Medicare Other

## 2021-01-11 ENCOUNTER — Ambulatory Visit: Payer: Medicare Other | Admitting: Speech Pathology

## 2021-01-11 ENCOUNTER — Other Ambulatory Visit: Payer: Self-pay

## 2021-01-11 ENCOUNTER — Encounter: Payer: Self-pay | Admitting: Speech Pathology

## 2021-01-11 DIAGNOSIS — Z9181 History of falling: Secondary | ICD-10-CM | POA: Diagnosis not present

## 2021-01-11 DIAGNOSIS — R4701 Aphasia: Secondary | ICD-10-CM

## 2021-01-11 DIAGNOSIS — R260 Ataxic gait: Secondary | ICD-10-CM | POA: Diagnosis not present

## 2021-01-11 DIAGNOSIS — I6329 Cerebral infarction due to unspecified occlusion or stenosis of other precerebral arteries: Secondary | ICD-10-CM | POA: Diagnosis not present

## 2021-01-11 DIAGNOSIS — R2681 Unsteadiness on feet: Secondary | ICD-10-CM

## 2021-01-11 DIAGNOSIS — R2689 Other abnormalities of gait and mobility: Secondary | ICD-10-CM

## 2021-01-11 DIAGNOSIS — R293 Abnormal posture: Secondary | ICD-10-CM | POA: Diagnosis not present

## 2021-01-11 DIAGNOSIS — R41841 Cognitive communication deficit: Secondary | ICD-10-CM

## 2021-01-11 DIAGNOSIS — M6281 Muscle weakness (generalized): Secondary | ICD-10-CM

## 2021-01-11 NOTE — Patient Instructions (Signed)
For homework:  Sort items into correct category (ex. Kitchen vs. Bedroom).  Before sorting, have Tyren repeat the pictured item and have him place it in the category he believes it goes in.

## 2021-01-11 NOTE — Therapy (Signed)
[place]. [place], [place], [address] Phone: [phone]   Fax:  [phone]  Speech Language Pathology Treatment  Patient Details  Name: Joe GOTH Sr. MRN: [id number] Date of Birth: [birth year] Referring Provider (SLP): Elsie Stain, MD   Encounter Date: 01/11/2021   End of Session - 01/11/21 1539    Visit Number 22    Number of Visits 25    Date for SLP Re-Evaluation 01/22/21    SLP Start Time 1532    SLP Stop Time  1613    SLP Time Calculation (min) 41 min    Activity Tolerance Patient tolerated treatment well;Patient limited by fatigue           Past Medical History:  Diagnosis Date  . 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 ([place]) 06/03/2019  . Cerebrovascular accident (CVA) due to occlusion of vertebral artery ([place])   . Diabetes mellitus without complication ([place])   . Dysphagia    post stroke  . Hypertension   . Stroke [facility])     Past Surgical History:  Procedure Laterality Date  . IR ANGIO INTRA EXTRACRAN SEL COM CAROTID INNOMINATE UNI L MOD SED  03/10/2019  . IR ANGIO VERTEBRAL SEL SUBCLAVIAN INNOMINATE BILAT MOD SED  03/10/2019  . IR CT HEAD LTD  03/10/2019  . IR PERCUTANEOUS ART THROMBECTOMY/INFUSION INTRACRANIAL INC DIAG ANGIO  03/10/2019  . RADIOLOGY WITH ANESTHESIA N/A 03/10/2019   Procedure: IR WITH ANESTHESIA/CODE STROKE;  Surgeon: Radiologist, Medication, MD;  Location: [facility];  Service: Radiology;  Laterality: N/A;    There were no vitals filed for this visit.   Subjective Assessment - 01/11/21 1537    Subjective No changes. Pt answered basic questions with the shake of his head.    Currently in Pain? No/denies                 ADULT SLP TREATMENT - 01/11/21 1557      General Information   Behavior/Cognition Alert;Cooperative;Pleasant mood;Distractible;Decreased sustained attention      Treatment Provided   Treatment  provided Cognitive-Linquistic      Cognitive-Linquistic Treatment   Treatment focused on Aphasia;Cognition    Skilled Treatment Facilitated categorization, expressive, and receptive language by having patient follow directions to sort cards into the "correct room in the house". Pt was able to complete with minA; however, needed increased cues to complete the task. Increased brain breaks given today.      Progression Toward Goals   Progression toward goals Progressing toward goals              SLP Short Term Goals - 01/11/21 1555      SLP SHORT TERM GOAL #1   Title Pt will verbalize yes/no when asked a question to increase verbal communication.    Time 1    Period Weeks    Status On-going   Pt is able to make a choice verbally, but may not correctly identify.     SLP SHORT TERM GOAL #2   Title Pt will follow through with swallowing saliva before speaking at home as reported by caregiver.    Baseline Swallows with rare cues in session.    Time 1    Period Weeks    Status On-going      SLP SHORT TERM GOAL #3   Title Pt will name 6/10 daily objects given phonemic cues to assist with making choices at home.  Baseline 3    Time 2    Period Weeks    Status Achieved            SLP Long Term Goals - 01/11/21 1556      SLP LONG TERM GOAL #1   Title Client will utilize compensatory strategies to communicate wants and needs effectively to different conversational partners and participate socially in functional living environment.    Time 2    Period Weeks    Status On-going      SLP LONG TERM GOAL #2   Title Client will demonstrate understanding of verbal communication related to daily activities and utilize compensatory strategies to more effectively communicate in their functional living environment.    Time 2    Period Weeks    Status On-going      SLP LONG TERM GOAL #3   Title Patient will develop functional attention skills to effectively attend to and communicate  in tasks of daily living in their functional living environment.    Time 2    Period Weeks    Status On-going            Plan - 01/11/21 1550    Clinical Impression Statement Pt showed increased fatiguability this session, falling asleep between task cards. He was able to sort into two categories when SLP told patient what the item was. Attention limited today's session.    Speech Therapy Frequency 2x / week    Duration 12 weeks    Treatment/Interventions Aspiration precaution training;Cueing hierarchy;Patient/family education;Functional tasks;Environmental controls;Cognitive reorganization;Multimodal communcation approach;Language facilitation;Compensatory techniques;Internal/external aids;SLP instruction and feedback;Compensatory strategies    Potential to Achieve Goals Fair    Potential Considerations Ability to learn/carryover information;Severity of impairments    Consulted and Agree with Plan of Care Patient;Family member/caregiver           Patient will benefit from skilled therapeutic intervention in order to improve the following deficits and impairments:   Aphasia  Cognitive communication deficit    Problem List Patient Active Problem List   Diagnosis Date Noted  . Left hemiparesis ([place]) 12/06/2020  . Benign essential HTN   . Cognitive deficit, post-stroke   . Controlled type 2 diabetes mellitus with hyperglycemia, without long-term current use of insulin ([place])   . 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 Dyslipidemia, goal LDL below 70 03/12/2019  . Dysphagia due to recent stroke 03/12/2019  . 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 Abnormality of gait   . Gait disturbance, post-stroke   . 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    [name], [place], [name]  01/11/2021, 4:04 PM  [place]. [place], [place], [address] Phone: [phone]   Fax:  [phone]   Name: Joe SIPPEL Sr. MRN: [id number] Date of Birth: 03/29/1951

## 2021-01-11 NOTE — Therapy (Signed)
[place]. [place], [place], [address] Phone: [phone]   Fax:  [phone]  Physical Therapy Treatment  Patient Details  Name: Joe ZILKA Sr. MRN: [id number] Date of Birth: Jan 09, 1951 Referring Provider (PT): Asencion Noble, MD   Encounter Date: 01/11/2021   PT End of Session - 01/11/21 1537    Visit Number 14    Date for PT Re-Evaluation 02/07/21    Authorization Type [employer] Medicare    PT Start Time 0347    PT Stop Time 1531    PT Time Calculation (min) 39 min    Equipment Utilized During Treatment Gait belt    Activity Tolerance Patient tolerated treatment well;Patient limited by fatigue    Behavior During Therapy Flat affect           Past Medical History:  Diagnosis Date  . 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 ([place]) 06/03/2019  . Cerebrovascular accident (CVA) due to occlusion of vertebral artery ([place])   . Diabetes mellitus without complication ([place])   . Dysphagia    post stroke  . Hypertension   . Stroke [facility])     Past Surgical History:  Procedure Laterality Date  . IR ANGIO INTRA EXTRACRAN SEL COM CAROTID INNOMINATE UNI L MOD SED  03/10/2019  . IR ANGIO VERTEBRAL SEL SUBCLAVIAN INNOMINATE BILAT MOD SED  03/10/2019  . IR CT HEAD LTD  03/10/2019  . IR PERCUTANEOUS ART THROMBECTOMY/INFUSION INTRACRANIAL INC DIAG ANGIO  03/10/2019  . RADIOLOGY WITH ANESTHESIA N/A 03/10/2019   Procedure: IR WITH ANESTHESIA/CODE STROKE;  Surgeon: Radiologist, Medication, MD;  Location: [facility];  Service: Radiology;  Laterality: N/A;    There were no vitals filed for this visit.   Subjective Assessment - 01/11/21 1536    Subjective Per wife called MD about swelling and not available, swelling went down so decided not to call again. Otherwise no new complaints    Patient is accompained by: Family member    Patient Stated Goals Pt's goals for therapy are to walk better.  (pt agrees with wife's statement)    Currently in Pain? No/denies                             [facility] Adult PT Treatment/Exercise - 01/11/21 0001      Ambulation/Gait   Gait Comments walking with FWW, cues for posture and some step length as well as cues for posture.  long distance walking while scanning for post its to getupright trunk and upward gaze. Walking in  bars fwd and bwd 2 UE support vs 1 UE support.      Therapeutic Activites    Other Therapeutic Activities supine > prone with min cues for sequencing. prone > QP with min cues for hand placement and getting into position. Able to complete 2 x 10 cross midline reaches on diagonal for cones with good trunk extension. attempted to get into half kneel with significant difficulty, limtied by tightness so deferred for today. Tall kneel sustained 30" x 3. tall kneel from mod QP x 15. QP to sidesitting to long sitting, long sit scoot to short sit at mat with min cues to achieve      Knee/Hip Exercises: Aerobic   Nustep L3 x 6 min BLE only withbetter upright posture - cues to reach 200 steps - able to complete with BLE only and able to pick up speed of  mvmt with cues for speed, able to maintain this nicely.      Knee/Hip Exercises: Seated   Sit to Sand 10 reps;2 sets   to FWW both with 2"step under right foot. 2nd set with cros sbody reach upward right to left                   PT Short Term Goals - 12/23/20 1748      PT SHORT TERM GOAL #1   Title Pt will perform HEP with wife's assistance, for improved strength, balance, gait.    Time 4    Period Weeks    Status On-going   wife reports difficulty with getting Mr [place] to do exercises at home            PT Long Term Goals - 01/05/21 1540      PT LONG TERM GOAL #1   Title Pt will perform progressive HEP with family supervision and assistance for improved balance, strength, gait.    Status On-going      PT LONG TERM GOAL #2   Title Pt will improve  5TSTS score to </= 20 seconds to demonstrate improved functional strength and decreased fall risk    Status On-going      PT LONG TERM GOAL #3   Title Pt will improve TUG score to less than or equal to 25 seconds for decreased fall risk.    Status On-going                 Plan - 01/11/21 1537    Clinical Impression Statement Tolerated session well today with no significant changes to VS today. Able to tolerate developmental positions today well with improved ease of transitions just some cues for sequencing steps. He was able to sustain QP and modified QP, tlal kneeling better today. Potential left inattention may be playing a role in postural stability. Not able to get into 1/2 knee today d/t some tightness. Discussed with wife continued monitoring for swelling and follow up with MD if persists!    PT Treatment/Interventions ADLs/Self Care Home Management;DME Instruction;Neuromuscular re-education;Balance training;Therapeutic exercise;Therapeutic activities;Functional mobility training;Stair training;Gait training;Patient/family education;Energy conservation;Manual techniques    PT Next Visit Plan Transfer training, gait training; wife education for safety with transfers and gait, consider QP positioning at some point progressing to tall kneeling and potentially 1/2 kneel with UE assist; postural upright, hard airex for pelvic neutral.  Can trial next visit with small wedge or towel roll under the left to see if we can kick in more trunk righting reactions and active WS left.    Consulted and Agree with Plan of Care Patient;Family member/caregiver    Family Member Consulted wife           Patient will benefit from skilled therapeutic intervention in order to improve the following deficits and impairments:  Abnormal gait,Decreased coordination,Difficulty walking,Decreased safety awareness,Decreased balance,Decreased strength,Decreased mobility  Visit Diagnosis: Muscle weakness  (generalized)  Ataxic gait  Abnormal posture  Other abnormalities of gait and mobility  Unsteadiness on feet  History of falling     Problem List Patient Active Problem List   Diagnosis Date Noted  . Left hemiparesis ([place]) 12/06/2020  . Benign essential HTN   . Cognitive deficit, post-stroke   . Controlled type 2 diabetes mellitus with hyperglycemia, without long-term current use of insulin ([place])   . 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 Dyslipidemia, goal LDL below 70 03/12/2019  . Dysphagia  due to recent stroke 03/12/2019  . 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 Abnormality of gait   . Gait disturbance, post-stroke   . 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    Hall Busing, PT, DPT 01/11/2021, 3:43 PM  [place]. [place], [place], [address] Phone: [phone]   Fax:  [phone]  Name: Joe WITHERS Sr. MRN: [id number] Date of Birth: May 19, 1951

## 2021-01-13 ENCOUNTER — Encounter: Payer: Self-pay | Admitting: Speech Pathology

## 2021-01-13 ENCOUNTER — Ambulatory Visit: Payer: Medicare Other | Admitting: Speech Pathology

## 2021-01-13 ENCOUNTER — Other Ambulatory Visit: Payer: Self-pay

## 2021-01-13 ENCOUNTER — Ambulatory Visit: Payer: Medicare Other

## 2021-01-13 DIAGNOSIS — R293 Abnormal posture: Secondary | ICD-10-CM

## 2021-01-13 DIAGNOSIS — R2689 Other abnormalities of gait and mobility: Secondary | ICD-10-CM

## 2021-01-13 DIAGNOSIS — M6281 Muscle weakness (generalized): Secondary | ICD-10-CM

## 2021-01-13 DIAGNOSIS — R4701 Aphasia: Secondary | ICD-10-CM | POA: Diagnosis not present

## 2021-01-13 DIAGNOSIS — Z9181 History of falling: Secondary | ICD-10-CM

## 2021-01-13 DIAGNOSIS — R260 Ataxic gait: Secondary | ICD-10-CM

## 2021-01-13 DIAGNOSIS — R41841 Cognitive communication deficit: Secondary | ICD-10-CM

## 2021-01-13 DIAGNOSIS — I6329 Cerebral infarction due to unspecified occlusion or stenosis of other precerebral arteries: Secondary | ICD-10-CM | POA: Diagnosis not present

## 2021-01-13 DIAGNOSIS — R2681 Unsteadiness on feet: Secondary | ICD-10-CM

## 2021-01-13 NOTE — Therapy (Signed)
[place]. [place], [place], [address] Phone: [phone]   Fax:  [phone]  Speech Language Pathology Treatment  Patient Details  Name: Joe WYSS Sr. MRN: [id number] Date of Birth: 06-27-1951 Referring Provider (SLP): Elsie Stain, MD   Encounter Date: 01/13/2021   End of Session - 01/13/21 1533    Visit Number 23    Number of Visits 25    Date for SLP Re-Evaluation 01/22/21    SLP Start Time 82    SLP Stop Time  1610    SLP Time Calculation (min) 42 min    Activity Tolerance Patient tolerated treatment well;Patient limited by fatigue           Past Medical History:  Diagnosis Date  . 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 ([place]) 06/03/2019  . Cerebrovascular accident (CVA) due to occlusion of vertebral artery ([place])   . Diabetes mellitus without complication ([place])   . Dysphagia    post stroke  . Hypertension   . Stroke [facility])     Past Surgical History:  Procedure Laterality Date  . IR ANGIO INTRA EXTRACRAN SEL COM CAROTID INNOMINATE UNI L MOD SED  03/10/2019  . IR ANGIO VERTEBRAL SEL SUBCLAVIAN INNOMINATE BILAT MOD SED  03/10/2019  . IR CT HEAD LTD  03/10/2019  . IR PERCUTANEOUS ART THROMBECTOMY/INFUSION INTRACRANIAL INC DIAG ANGIO  03/10/2019  . RADIOLOGY WITH ANESTHESIA N/A 03/10/2019   Procedure: IR WITH ANESTHESIA/CODE STROKE;  Surgeon: Radiologist, Medication, MD;  Location: [place];  Service: Radiology;  Laterality: N/A;    There were no vitals filed for this visit.   Subjective Assessment - 01/13/21 1529    Subjective Pt answered question regarding his favorite music. He said he liked country and jazz (in a complete sentence).    Currently in Pain? No/denies                 ADULT SLP TREATMENT - 01/13/21 1609      General Information   Behavior/Cognition Alert;Cooperative;Pleasant mood;Distractible;Decreased sustained attention       Treatment Provided   Treatment provided Cognitive-Linquistic      Cognitive-Linquistic Treatment   Treatment focused on Aphasia;Cognition    Skilled Treatment Facilitated expressive and receptive language use. SLP assessed patient with answering WH-questions. Patient correctly answered 22/25 independently. Pt "played opossum" when wife came into room.      Assessment / Recommendations / Plan   Plan Continue with current plan of care      Progression Toward Goals   Progression toward goals Progressing toward goals              SLP Short Term Goals - 01/13/21 1608      SLP SHORT TERM GOAL #1   Title Pt will verbalize yes/no when asked a question to increase verbal communication.    Time 1    Period Weeks    Status On-going   Pt is able to make a choice verbally, but may not correctly identify.     SLP SHORT TERM GOAL #2   Title Pt will follow through with swallowing saliva before speaking at home as reported by caregiver.    Baseline Swallows with rare cues in session.    Time 1    Period Weeks    Status On-going      SLP SHORT TERM GOAL #3   Title Pt will name 6/10 daily objects given phonemic cues to  assist with making choices at home.    Baseline 3    Time 2    Period Weeks    Status Achieved            SLP Long Term Goals - 01/13/21 1609      SLP LONG TERM GOAL #1   Title Client will utilize compensatory strategies to communicate wants and needs effectively to different conversational partners and participate socially in functional living environment.    Time 2    Period Weeks    Status On-going      SLP LONG TERM GOAL #2   Title Client will demonstrate understanding of verbal communication related to daily activities and utilize compensatory strategies to more effectively communicate in their functional living environment.    Time 2    Period Weeks    Status On-going      SLP LONG TERM GOAL #3   Title Patient will develop functional attention  skills to effectively attend to and communicate in tasks of daily living in their functional living environment.    Time 2    Period Weeks    Status On-going             Patient will benefit from skilled therapeutic intervention in order to improve the following deficits and impairments:   Aphasia  Cognitive communication deficit    Problem List Patient Active Problem List   Diagnosis Date Noted  . Left hemiparesis ([place]) 12/06/2020  . Benign essential HTN   . Cognitive deficit, post-stroke   . Controlled type 2 diabetes mellitus with hyperglycemia, without long-term current use of insulin ([place])   . 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 Dyslipidemia, goal LDL below 70 03/12/2019  . Dysphagia due to recent stroke 03/12/2019  . 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 Abnormality of gait   . Gait disturbance, post-stroke   . 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    [name], [place], [name]  01/13/2021, 4:12 PM  [place]. [place], [place], [address] Phone: [phone]   Fax:  [phone]   Name: SHEPARD KELTZ Sr. MRN: [id number] Date of Birth: 1950/09/14

## 2021-01-13 NOTE — Therapy (Signed)
[place] [place], [place], [address] Phone: [phone]   Fax:  [phone]  Physical Therapy Treatment  Patient Details  Name: Joe VOGELSANG Sr. MRN: [id number] Date of Birth: 1951/06/15 Referring Provider (PT): Joe Noble, MD   Encounter Date: 01/13/2021   PT End of Session - 01/13/21 1534    Visit Number 15    Date for PT Re-Evaluation 02/07/21    Authorization Type UHC Medicare    PT Start Time 0175    PT Stop Time 1530    PT Time Calculation (min) 45 min    Equipment Utilized During Treatment Gait belt    Activity Tolerance Patient tolerated treatment well;Patient limited by fatigue    Behavior During Therapy Flat affect           Past Medical History:  Diagnosis Date  . 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 ([place]) 06/03/2019  . Cerebrovascular accident (CVA) due to occlusion of vertebral artery ([place])   . Diabetes mellitus without complication ([place])   . Dysphagia    post stroke  . Hypertension   . Stroke [facility])     Past Surgical History:  Procedure Laterality Date  . IR ANGIO INTRA EXTRACRAN SEL COM CAROTID INNOMINATE UNI L MOD SED  03/10/2019  . IR ANGIO VERTEBRAL SEL SUBCLAVIAN INNOMINATE BILAT MOD SED  03/10/2019  . IR CT HEAD LTD  03/10/2019  . IR PERCUTANEOUS ART THROMBECTOMY/INFUSION INTRACRANIAL INC DIAG ANGIO  03/10/2019  . RADIOLOGY WITH ANESTHESIA N/A 03/10/2019   Procedure: IR WITH ANESTHESIA/CODE STROKE;  Surgeon: Radiologist, Medication, MD;  Location: [place];  Service: Radiology;  Laterality: N/A;    There were no vitals filed for this visit.   Subjective Assessment - 01/13/21 1534    Subjective no new complaints    Patient is accompained by: Family member    Patient Stated Goals Pt's goals for therapy are to walk better. (pt agrees with wife's statement)    Currently in Pain? No/denies                              [facility] Adult PT Treatment/Exercise - 01/13/21 0001      Ambulation/Gait   Gait Comments HHA x 2 walk forward and backward x 6 steps x 5 x 2 sets. Side steps in standing with HHAx2 x2 laps near mat table      Therapeutic Activites    Other Therapeutic Activities Modified plantigrade <> QP on mat min A to sequence and position legs on or off mat, creeping hands/knees to pivot on mat into position, reach and place cones on diagonal across midline 12 x 2 B. Tall kneel reach up x 8. Finding midline in mirror in seated x 10 x 5 second holds with initial tactile cues for alignment      Knee/Hip Exercises: Seated   Other Seated Knee/Hip Exercises Seated fitter leg presses LLE only 10 x 2 B                    PT Short Term Goals - 12/23/20 1748      PT SHORT TERM GOAL #1   Title Pt will perform HEP with wife's assistance, for improved strength, balance, gait.    Time 4    Period Weeks    Status On-going   wife reports difficulty with getting Joe [place] to do exercises at  home            PT Long Term Goals - 01/05/21 1540      PT LONG TERM GOAL #1   Title Pt will perform progressive HEP with family supervision and assistance for improved balance, strength, gait.    Status On-going      PT LONG TERM GOAL #2   Title Pt will improve 5TSTS score to </= 20 seconds to demonstrate improved functional strength and decreased fall risk    Status On-going      PT LONG TERM GOAL #3   Title Pt will improve TUG score to less than or equal to 25 seconds for decreased fall risk.    Status On-going                 Plan - 01/13/21 1535    Clinical Impression Statement Tolerated session well. Able to get into QP form modifed plantigrade with min A at legs for positioning and sequencing but able to let therapist kne which leg he wanted to strat with with mvmts. QP continues to be toelrated nicely with good symmetrical WB and improved trunk ext. . With  tlal kneel reaches a bit more ataxic mvmts/shakiness. End of session with finding midline in sitting EOM in mirror and he was able to maintain for short repetition holds after tactile cues and guidance - plan to try this in standing next visit    PT Treatment/Interventions ADLs/Self Care Home Management;DME Instruction;Neuromuscular re-education;Balance training;Therapeutic exercise;Therapeutic activities;Functional mobility training;Stair training;Gait training;Patient/family education;Energy conservation;Manual techniques    PT Next Visit Plan Transfer training, gait training; wife education for safety with transfers and gait, consider QP positioning at some point progressing to tall kneeling and potentially 1/2 kneel with UE assist; postural upright, hard airex for pelvic neutral.  Can trial next visit with small wedge or towel roll under the left to see if we can kick in more trunk righting reactions and active WS left.    Consulted and Agree with Plan of Care Patient;Family member/caregiver    Family Member Consulted wife           Patient will benefit from skilled therapeutic intervention in order to improve the following deficits and impairments:  Abnormal gait,Decreased coordination,Difficulty walking,Decreased safety awareness,Decreased balance,Decreased strength,Decreased mobility  Visit Diagnosis: Muscle weakness (generalized)  Ataxic gait  Abnormal posture  Other abnormalities of gait and mobility  Unsteadiness on feet  History of falling  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 [facility])     Problem List Patient Active Problem List   Diagnosis Date Noted  . Left hemiparesis ([place]) 12/06/2020  . Benign essential HTN   . Cognitive deficit, post-stroke   . Controlled type 2 diabetes mellitus with hyperglycemia, without long-term current use of insulin ([place])   . 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 Hemichorea/Hemibalismus  LUE 03/31/2019  . Dyslipidemia, goal LDL below 70 03/12/2019  . Dysphagia due to recent stroke 03/12/2019  . 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 Abnormality of gait   . Gait disturbance, post-stroke   . 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    Hall Busing, PT, DPT 01/13/2021, 3:38 PM  [place]. [place], [place], [address] Phone: [phone]   Fax:  [phone]  Name: Joe MUSKA Sr. MRN: [id number] Date of Birth: 22-Jun-1951

## 2021-01-15 ENCOUNTER — Other Ambulatory Visit: Payer: Self-pay | Admitting: Critical Care Medicine

## 2021-01-15 DIAGNOSIS — I69319 Unspecified symptoms and signs involving cognitive functions following cerebral infarction: Secondary | ICD-10-CM

## 2021-01-15 NOTE — Telephone Encounter (Signed)
Requested Prescriptions  Pending Prescriptions Disp Refills  . donepezil (ARICEPT) 10 MG tablet [Pharmacy Med Name: Donepezil HCl 10 MG Oral Tablet] 90 tablet 0    Sig: TAKE 1 TABLET BY MOUTH AT BEDTIME     Neurology:  Alzheimer's Agents Passed - 01/15/2021  6:02 PM      Passed - Valid encounter within last 6 months    Recent Outpatient Visits          1 month ago Controlled type 2 diabetes mellitus with hyperglycemia, without long-term current use of insulin ([place])   [place] Elsie Stain, MD   3 months ago Type 2 diabetes mellitus with other circulatory complication, without long-term current use of insulin [facility])   [place] Elsie Stain, MD   5 months ago Dysphagia due to recent stroke   [place], MD   7 months ago Need for influenza vaccination   [place], Stephen L, RPH-CPP   7 months ago Type 2 diabetes mellitus with other circulatory complication, without long-term current use of insulin [facility])   [place], MD      Future Appointments            In 1 month Joya Gaskins Burnett Harry, MD [place]

## 2021-01-18 ENCOUNTER — Other Ambulatory Visit: Payer: Self-pay

## 2021-01-18 ENCOUNTER — Ambulatory Visit: Payer: Medicare Other

## 2021-01-18 ENCOUNTER — Ambulatory Visit: Payer: Medicare Other | Admitting: Speech Pathology

## 2021-01-18 ENCOUNTER — Encounter: Payer: Self-pay | Admitting: Speech Pathology

## 2021-01-18 DIAGNOSIS — R4701 Aphasia: Secondary | ICD-10-CM | POA: Diagnosis not present

## 2021-01-18 DIAGNOSIS — I6329 Cerebral infarction due to unspecified occlusion or stenosis of other precerebral arteries: Secondary | ICD-10-CM

## 2021-01-18 DIAGNOSIS — R260 Ataxic gait: Secondary | ICD-10-CM

## 2021-01-18 DIAGNOSIS — R41841 Cognitive communication deficit: Secondary | ICD-10-CM

## 2021-01-18 DIAGNOSIS — R293 Abnormal posture: Secondary | ICD-10-CM

## 2021-01-18 DIAGNOSIS — Z9181 History of falling: Secondary | ICD-10-CM | POA: Diagnosis not present

## 2021-01-18 DIAGNOSIS — R2689 Other abnormalities of gait and mobility: Secondary | ICD-10-CM | POA: Diagnosis not present

## 2021-01-18 DIAGNOSIS — M6281 Muscle weakness (generalized): Secondary | ICD-10-CM | POA: Diagnosis not present

## 2021-01-18 DIAGNOSIS — R2681 Unsteadiness on feet: Secondary | ICD-10-CM

## 2021-01-18 NOTE — Therapy (Signed)
[place]. [place], [place], [address] Phone: [phone]   Fax:  [phone]  Speech Language Pathology Treatment  Patient Details  Name: Joe [place] Sr. MRN: [id number] Date of Birth: Oct 23, 1950 Referring Provider (SLP): Elsie Stain, MD   Encounter Date: 01/18/2021   End of Session - 01/18/21 1535    Visit Number 24    Number of Visits 25    Date for SLP Re-Evaluation 01/22/21    SLP Start Time 1530    SLP Stop Time  1610    SLP Time Calculation (min) 40 min    Activity Tolerance Patient tolerated treatment well;Patient limited by fatigue           Past Medical History:  Diagnosis Date  . 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 ([place]) 06/03/2019  . Cerebrovascular accident (CVA) due to occlusion of vertebral artery ([place])   . Diabetes mellitus without complication ([place])   . Dysphagia    post stroke  . Hypertension   . Stroke [facility])     Past Surgical History:  Procedure Laterality Date  . IR ANGIO INTRA EXTRACRAN SEL COM CAROTID INNOMINATE UNI L MOD SED  03/10/2019  . IR ANGIO VERTEBRAL SEL SUBCLAVIAN INNOMINATE BILAT MOD SED  03/10/2019  . IR CT HEAD LTD  03/10/2019  . IR PERCUTANEOUS ART THROMBECTOMY/INFUSION INTRACRANIAL INC DIAG ANGIO  03/10/2019  . RADIOLOGY WITH ANESTHESIA N/A 03/10/2019   Procedure: IR WITH ANESTHESIA/CODE STROKE;  Surgeon: Radiologist, Medication, MD;  Location: [place];  Service: Radiology;  Laterality: N/A;    There were no vitals filed for this visit.   Subjective Assessment - 01/18/21 1534    Subjective Pt reports no did some exercise this weekend.    Currently in Pain? No/denies                 ADULT SLP TREATMENT - 01/18/21 1609      General Information   Behavior/Cognition Alert;Cooperative;Pleasant mood;Distractible;Decreased sustained attention      Treatment Provided   Treatment provided  Cognitive-Linquistic      Cognitive-Linquistic Treatment   Treatment focused on Aphasia;Cognition    Skilled Treatment Assessed patient using the SLUMs; pt scored a 9/30 - improvement from 0/30 at evaluation. Pt continues to struggle with some aphasia in addition to severe cognitive-communication impairment.      Assessment / Recommendations / Plan   Plan Continue with current plan of care      Progression Toward Goals   Progression toward goals Progressing toward goals              SLP Short Term Goals - 01/18/21 1549      SLP SHORT TERM GOAL #1   Title Pt will verbalize yes/no when asked a question to increase verbal communication.    Time 1    Period Weeks    Status Achieved   Pt is able to make a choice verbally, but may not correctly identify.     SLP SHORT TERM GOAL #2   Title Pt will follow through with swallowing saliva before speaking at home as reported by caregiver.    Baseline Swallows with rare cues in session.    Time 1    Period Weeks    Status Partially Met      SLP SHORT TERM GOAL #3   Title Pt will name 6/10 daily objects given phonemic cues to assist with making choices at home.  Baseline 3    Time 2    Period Weeks    Status Achieved            SLP Long Term Goals - 01/18/21 1609      SLP LONG TERM GOAL #1   Title Client will utilize compensatory strategies to communicate wants and needs effectively to different conversational partners and participate socially in functional living environment.    Time 1    Period Weeks    Status On-going      SLP LONG TERM GOAL #2   Title Client will demonstrate understanding of verbal communication related to daily activities and utilize compensatory strategies to more effectively communicate in their functional living environment.    Time 1    Period Weeks    Status On-going      SLP LONG TERM GOAL #3   Title Patient will develop functional attention skills to effectively attend to and communicate in  tasks of daily living in their functional living environment.    Time 1    Period Weeks    Status On-going            Plan - 01/18/21 1548    Clinical Impression Statement Pt was able to correctly write his name for the first time today given an initial model (and then erased). Pt was able to copy down his DOB correctly.    Speech Therapy Frequency 2x / week    Duration 12 weeks    Treatment/Interventions Aspiration precaution training;Cueing hierarchy;Patient/family education;Functional tasks;Environmental controls;Cognitive reorganization;Multimodal communcation approach;Language facilitation;Compensatory techniques;Internal/external aids;SLP instruction and feedback;Compensatory strategies    Potential to Achieve Goals Fair    Potential Considerations Ability to learn/carryover information;Severity of impairments    Consulted and Agree with Plan of Care Patient;Family member/caregiver           Patient will benefit from skilled therapeutic intervention in order to improve the following deficits and impairments:   Cognitive communication deficit  Aphasia    Problem List Patient Active Problem List   Diagnosis Date Noted  . Left hemiparesis ([place]) 12/06/2020  . Benign essential HTN   . Cognitive deficit, post-stroke   . Controlled type 2 diabetes mellitus with hyperglycemia, without long-term current use of insulin ([place])   . 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 Dyslipidemia, goal LDL below 70 03/12/2019  . Dysphagia due to recent stroke 03/12/2019  . 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 Abnormality of gait   . Gait disturbance, post-stroke   . 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    [place], [place], [name]  01/18/2021, 4:11 PM  [place]. [place], [place], [address] Phone: [phone]   Fax:  [phone]   Name: Joe BERISHA Sr. MRN: [id number] Date of Birth: 04/02/1951

## 2021-01-18 NOTE — Therapy (Signed)
[place]. [place], [place], [address] Phone: [phone]   Fax:  [phone]  Physical Therapy Treatment  Patient Details  Name: Joe FERRANDO Sr. MRN: [id number] Date of Birth: October 18, 1950 Referring Provider (PT): Asencion Noble, MD   Encounter Date: 01/18/2021   PT End of Session - 01/18/21 1528    Visit Number 16    Date for PT Re-Evaluation 02/07/21    Authorization Type UHC Medicare    PT Start Time 0301    PT Stop Time 0328    PT Time Calculation (min) 27 min    Equipment Utilized During Treatment Gait belt    Activity Tolerance Patient tolerated treatment well;Patient limited by fatigue    Behavior During Therapy Flat affect           Past Medical History:  Diagnosis Date  . 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 ([place]) 06/03/2019  . Cerebrovascular accident (CVA) due to occlusion of vertebral artery ([place])   . Diabetes mellitus without complication ([place])   . Dysphagia    post stroke  . Hypertension   . Stroke [facility])     Past Surgical History:  Procedure Laterality Date  . IR ANGIO INTRA EXTRACRAN SEL COM CAROTID INNOMINATE UNI L MOD SED  03/10/2019  . IR ANGIO VERTEBRAL SEL SUBCLAVIAN INNOMINATE BILAT MOD SED  03/10/2019  . IR CT HEAD LTD  03/10/2019  . IR PERCUTANEOUS ART THROMBECTOMY/INFUSION INTRACRANIAL INC DIAG ANGIO  03/10/2019  . RADIOLOGY WITH ANESTHESIA N/A 03/10/2019   Procedure: IR WITH ANESTHESIA/CODE STROKE;  Surgeon: Radiologist, Medication, MD;  Location: [place];  Service: Radiology;  Laterality: N/A;    There were no vitals filed for this visit.   Subjective Assessment - 01/18/21 1504    Subjective Arrived 16 min late to session today but able to accomodate for abbreviated treatment.    Patient is accompained by: Family member    Patient Stated Goals Pt's goals for therapy are to walk better. (pt agrees with wife's statement)     Currently in Pain? No/denies                             [facility] Adult PT Treatment/Exercise - 01/18/21 0001      Therapeutic Activites    Other Therapeutic Activities STS with facilitation for left WS into standing 10 x 3 sets. lateral prop sit on forearms EOM with forearms on elevated pillow surface with multidirectional reaches emphasis on anterior WS and rotations 11 x 4 sets.                    PT Short Term Goals - 12/23/20 1748      PT SHORT TERM GOAL #1   Title Pt will perform HEP with wife's assistance, for improved strength, balance, gait.    Time 4    Period Weeks    Status On-going   wife reports difficulty with getting Mr [place] to do exercises at home            PT Long Term Goals - 01/05/21 1540      PT LONG TERM GOAL #1   Title Pt will perform progressive HEP with family supervision and assistance for improved balance, strength, gait.    Status On-going      PT LONG TERM GOAL #2   Title Pt will improve 5TSTS score to </= 20  seconds to demonstrate improved functional strength and decreased fall risk    Status On-going      PT LONG TERM GOAL #3   Title Pt will improve TUG score to less than or equal to 25 seconds for decreased fall risk.    Status On-going                 Plan - 01/18/21 1529    Clinical Impression Statement Session abbreviated today d/t late arrival and spent most of session faciltiating anterior WS, weight bearing and WS with funcitonal motions on the left side. He tolerated all exericses nicely but did demo some fatigue with STS from mat table toward end of session    PT Treatment/Interventions ADLs/Self Care Home Management;DME Instruction;Neuromuscular re-education;Balance training;Therapeutic exercise;Therapeutic activities;Functional mobility training;Stair training;Gait training;Patient/family education;Energy conservation;Manual techniques    PT Next Visit Plan Transfer training, gait training; wife  education for safety with transfers and gait, consider QP positioning at some point progressing to tall kneeling and potentially 1/2 kneel with UE assist; postural upright, hard airex for pelvic neutral.  Can trial next visit with small wedge or towel roll under the left to see if we can kick in more trunk righting reactions and active WS left.    Consulted and Agree with Plan of Care Patient;Family member/caregiver    Family Member Consulted wife           Patient will benefit from skilled therapeutic intervention in order to improve the following deficits and impairments:  Abnormal gait,Decreased coordination,Difficulty walking,Decreased safety awareness,Decreased balance,Decreased strength,Decreased mobility  Visit Diagnosis: Ataxic gait  Abnormal posture  Other abnormalities of gait and mobility  Unsteadiness on feet  History of falling  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 [facility])     Problem List Patient Active Problem List   Diagnosis Date Noted  . Left hemiparesis ([place]) 12/06/2020  . Benign essential HTN   . Cognitive deficit, post-stroke   . Controlled type 2 diabetes mellitus with hyperglycemia, without long-term current use of insulin ([place])   . 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 Dyslipidemia, goal LDL below 70 03/12/2019  . Dysphagia due to recent stroke 03/12/2019  . 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 Abnormality of gait   . Gait disturbance, post-stroke   . 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    Hall Busing , PT, DPT 01/18/2021, 3:30 PM  [place]. [place], [place], [address] Phone: [phone]   Fax:  [phone]  Name: [name] MRN: [id number] Date of Birth:  [birth year]

## 2021-01-20 ENCOUNTER — Other Ambulatory Visit: Payer: Self-pay

## 2021-01-20 ENCOUNTER — Encounter: Payer: Self-pay | Admitting: Speech Pathology

## 2021-01-20 ENCOUNTER — Ambulatory Visit: Payer: Medicare Other | Admitting: Speech Pathology

## 2021-01-20 ENCOUNTER — Ambulatory Visit: Payer: Medicare Other

## 2021-01-20 VITALS — BP 135/87

## 2021-01-20 DIAGNOSIS — M6281 Muscle weakness (generalized): Secondary | ICD-10-CM | POA: Diagnosis not present

## 2021-01-20 DIAGNOSIS — R2689 Other abnormalities of gait and mobility: Secondary | ICD-10-CM

## 2021-01-20 DIAGNOSIS — R260 Ataxic gait: Secondary | ICD-10-CM | POA: Diagnosis not present

## 2021-01-20 DIAGNOSIS — R2681 Unsteadiness on feet: Secondary | ICD-10-CM | POA: Diagnosis not present

## 2021-01-20 DIAGNOSIS — R41841 Cognitive communication deficit: Secondary | ICD-10-CM

## 2021-01-20 DIAGNOSIS — R293 Abnormal posture: Secondary | ICD-10-CM

## 2021-01-20 DIAGNOSIS — I6329 Cerebral infarction due to unspecified occlusion or stenosis of other precerebral arteries: Secondary | ICD-10-CM | POA: Diagnosis not present

## 2021-01-20 DIAGNOSIS — R4701 Aphasia: Secondary | ICD-10-CM

## 2021-01-20 DIAGNOSIS — Z9181 History of falling: Secondary | ICD-10-CM

## 2021-01-20 NOTE — Therapy (Signed)
[place]. [place], [place], [address] Phone: [phone]   Fax:  [phone]  Speech Language Pathology Treatment & Recertification  Patient Details  Name: Joe CHANEY Sr. MRN: [id number] Date of Birth: 11/28/1950 Referring Provider (SLP): Elsie Stain, MD   Encounter Date: 01/20/2021   End of Session - 01/20/21 1518    Visit Number 25    Number of Visits 25   Asking for 16 visits x2/week for 8 weeks.   Date for SLP Re-Evaluation 03/22/21    SLP Start Time 1445    SLP Stop Time  1529    SLP Time Calculation (min) 44 min    Activity Tolerance Patient tolerated treatment well;Patient limited by fatigue           Past Medical History:  Diagnosis Date  . 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 ([place]) 06/03/2019  . Cerebrovascular accident (CVA) due to occlusion of vertebral artery ([place])   . Diabetes mellitus without complication ([place])   . Dysphagia    post stroke  . Hypertension   . Stroke [facility])     Past Surgical History:  Procedure Laterality Date  . IR ANGIO INTRA EXTRACRAN SEL COM CAROTID INNOMINATE UNI L MOD SED  03/10/2019  . IR ANGIO VERTEBRAL SEL SUBCLAVIAN INNOMINATE BILAT MOD SED  03/10/2019  . IR CT HEAD LTD  03/10/2019  . IR PERCUTANEOUS ART THROMBECTOMY/INFUSION INTRACRANIAL INC DIAG ANGIO  03/10/2019  . RADIOLOGY WITH ANESTHESIA N/A 03/10/2019   Procedure: IR WITH ANESTHESIA/CODE STROKE;  Surgeon: Radiologist, Medication, MD;  Location: [place];  Service: Radiology;  Laterality: N/A;    There were no vitals filed for this visit.   Subjective Assessment - 01/20/21 1516    Subjective Pt reports he is "fine" today.    Currently in Pain? No/denies                 ADULT SLP TREATMENT - 01/20/21 1516      General Information   Behavior/Cognition Alert;Cooperative;Pleasant mood;Distractible;Decreased sustained attention      Treatment  Provided   Treatment provided Cognitive-Linquistic      Cognitive-Linquistic Treatment   Treatment focused on Aphasia    Skilled Treatment Assessed patient with WAB-B this session for recertification. Pt is currently classified as "Transcortical Motor" aphasia. Upon entry to therapy, pt fell into the "global aphasia" due to limited comprehension (or participation) and poor word fluency. Repetition is [facility].      Assessment / Recommendations / Plan   Plan Continue with current plan of care              SLP Short Term Goals - 01/20/21 1520      SLP SHORT TERM GOAL #1   Title Pt will name 6/10 objects independenty over 3 sessions to increase expressive communication.    Time 4    Period Weeks    Status New   Pt is able to make a choice verbally, but may not correctly identify.     SLP SHORT TERM GOAL #2   Title Pt will follow through with swallowing saliva before speaking at home as reported by caregiver.    Time 4    Period Weeks    Status On-going      SLP SHORT TERM GOAL #3   Title Pt will demonstrate appropriate alternating attention between two tasks with <1 verbal/visual cue.    Time 4    Period  Weeks    Status New            SLP Long Term Goals - 01/20/21 1523      SLP LONG TERM GOAL #1   Title Client will utilize compensatory strategies to communicate wants and needs effectively to different conversational partners and participate socially in functional living environment.    Time 8    Period Weeks    Status On-going      SLP LONG TERM GOAL #2   Title Client will demonstrate understanding of verbal communication related to daily activities and utilize compensatory strategies to more effectively communicate in their functional living environment.    Time 8    Period Weeks    Status On-going      SLP LONG TERM GOAL #3   Title Patient will develop functional attention skills to effectively attend to and communicate in tasks of daily living in their functional  living environment.    Time 8    Period Weeks    Status On-going            Plan - 01/20/21 1519    Clinical Impression Statement Pt completed the WAB this session. Pt was not able to complete WAB at initial evaluation due to severe cognitive disorder and/or lack of motivation. Pt completed this session with minimal encouragement from therapist. Continues to increase verbal fluency.    Speech Therapy Frequency 2x / week    Duration 12 weeks    Treatment/Interventions Aspiration precaution training;Cueing hierarchy;Patient/family education;Functional tasks;Environmental controls;Cognitive reorganization;Multimodal communcation approach;Language facilitation;Compensatory techniques;Internal/external aids;SLP instruction and feedback;Compensatory strategies    Potential to Achieve Goals Fair    Potential Considerations Ability to learn/carryover information;Severity of impairments    Consulted and Agree with Plan of Care Patient;Family member/caregiver           Patient will benefit from skilled therapeutic intervention in order to improve the following deficits and impairments:   Aphasia  Cognitive communication deficit    Problem List Patient Active Problem List   Diagnosis Date Noted  . Left hemiparesis ([place]) 12/06/2020  . Benign essential HTN   . Cognitive deficit, post-stroke   . Controlled type 2 diabetes mellitus with hyperglycemia, without long-term current use of insulin ([place])   . 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 Dyslipidemia, goal LDL below 70 03/12/2019  . Dysphagia due to recent stroke 03/12/2019  . 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 Abnormality of gait   . Gait disturbance, post-stroke   . 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   Speech Therapy Recertification Note  Dates of Reporting Period: 10/23/20 to  01/20/21  Objective Reports of Subjective Statement: Pt has made improvements in expressive communication and attention. Pt is requiring less cueing to answer verbally than at initial evaluation. Wife reports that he is beginning to speak a little more at home when she asks him questions (ex: what do you want to eat? ___ or ____). His understanding has increased with the increase in attention skills.   Objective Measurements: See tx notes. Pt occasionally requires cueing reminders to swallow; however, overall, pt has been able to swallow without cueing to answer a question.   Goal Update: Achieved and Updated  Plan: Continue skilled services to address expressive communication  Reason Skilled Services are Required: Skilled services are required to increase pt's and wife's QOL. Pt continues to demonstrate severe cognitive  deficits; however, is beginning to communicate more verbally.    Rosann Auerbach [place] MS, [place], [name]  01/20/2021, 3:24 PM  [place]. [place], [place], [address] Phone: [phone]   Fax:  [phone]   Name: Joe CUDWORTH Sr. MRN: [id number] Date of Birth: 1950/10/30

## 2021-01-20 NOTE — Addendum Note (Signed)
Addended by: Royston Sinner L on: 01/20/2021 03:29 PM   Modules accepted: Orders

## 2021-01-20 NOTE — Therapy (Signed)
[place]. [place], [place], [address] Phone: [phone]   Fax:  [phone]  Physical Therapy Treatment  Patient Details  Name: Joe Perry Sr. MRN: [id number] Date of Birth: 1951/06/15 Referring Provider (PT): Asencion Noble, MD   Encounter Date: 01/20/2021   PT End of Session - 01/20/21 1447    Visit Number 17    Date for PT Re-Evaluation 02/07/21    Authorization Type [employer] Medicare    PT Start Time 1400    PT Stop Time 3235    PT Time Calculation (min) 45 min    Equipment Utilized During Treatment Gait belt    Activity Tolerance Patient tolerated treatment well;Patient limited by fatigue    Behavior During Therapy Flat affect           Past Medical History:  Diagnosis Date  . 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 ([place]) 06/03/2019  . Cerebrovascular accident (CVA) due to occlusion of vertebral artery ([place])   . Diabetes mellitus without complication ([place])   . Dysphagia    post stroke  . Hypertension   . Stroke [facility])     Past Surgical History:  Procedure Laterality Date  . IR ANGIO INTRA EXTRACRAN SEL COM CAROTID INNOMINATE UNI L MOD SED  03/10/2019  . IR ANGIO VERTEBRAL SEL SUBCLAVIAN INNOMINATE BILAT MOD SED  03/10/2019  . IR CT HEAD LTD  03/10/2019  . IR PERCUTANEOUS ART THROMBECTOMY/INFUSION INTRACRANIAL INC DIAG ANGIO  03/10/2019  . RADIOLOGY WITH ANESTHESIA N/A 03/10/2019   Procedure: IR WITH ANESTHESIA/CODE STROKE;  Surgeon: Radiologist, Medication, MD;  Location: [place];  Service: Radiology;  Laterality: N/A;    Vitals:   01/20/21 1407  BP: 135/87     Subjective Assessment - 01/20/21 1454    Subjective No new complaints, per wife "he doesnt like to do exercises at home". Still getting foot swelling.    Patient is accompained by: Family member    Patient Stated Goals Pt's goals for therapy are to walk better. (pt agrees with wife's statement)     Currently in Pain? No/denies                             [facility] Adult PT Treatment/Exercise - 01/20/21 0001      Ambulation/Gait   Gait Comments HHA x 2 walk forward and backward x 6 steps x 5 x 2 sets. Side steps in standing with HHAx2 x2 laps near mat table      High Level Balance   High Level Balance Comments Marching in standing 10 x 2 alternating at [facility] with facilitation for lateral WS and gentle approximation with SL. Pregait WS x 10 with HH for left WS and left knee blocked for stance.  Seated ball toss in high perch position with cues for maintaining feet on floor and for maintaining anterior/upright trunk.  standing ball toss with close CGA, with facilitation for upright trunk.  Seated OH flexion with 2# wate bar x 10.      Therapeutic Activites    Other Therapeutic Activities STS with facilitation for left WS into standing 10 x 3 sets.                    PT Short Term Goals - 12/23/20 1748      PT SHORT TERM GOAL #1   Title Pt will perform HEP with wife's assistance,  for improved strength, balance, gait.    Time 4    Period Weeks    Status On-going   wife reports difficulty with getting Mr Joe Perry to do exercises at home            PT Long Term Goals - 01/05/21 1540      PT LONG TERM GOAL #1   Title Pt will perform progressive HEP with family supervision and assistance for improved balance, strength, gait.    Status On-going      PT LONG TERM GOAL #2   Title Pt will improve 5TSTS score to </= 20 seconds to demonstrate improved functional strength and decreased fall risk    Status On-going      PT LONG TERM GOAL #3   Title Pt will improve TUG score to less than or equal to 25 seconds for decreased fall risk.    Status On-going                 Plan - 01/20/21 1448    Clinical Impression Statement tolerated all activities nicely today. There was a little carryover of midline for sitting posture but this diminishes in standing so  we spent alot of time practicing WS toward the left with each exercise today. Spent much more time in upright posture in sitting and standing throughout session, some carryover with ambulation. it is possible that he maintains downward gaze throughout gait d/t sensory and/or proprioceptive deficits because when practicing static stance his is able to sustain upward  gaze with less cueing. Per wife he still gets swelling in his feet but has not tried calling MD again d/t long hold wait times when she called last. I encouraged her to reach out to them about this.    PT Frequency 1x / week    PT Treatment/Interventions ADLs/Self Care Home Management;DME Instruction;Neuromuscular re-education;Balance training;Therapeutic exercise;Therapeutic activities;Functional mobility training;Stair training;Gait training;Patient/family education;Energy conservation;Manual techniques    PT Next Visit Plan Transfer training, gait training; wife education for safety with transfers and gait, consider QP positioning at some point progressing to tall kneeling and potentially 1/2 kneel with UE assist; postural upright, hard airex for pelvic neutral.    Consulted and Agree with Plan of Care Patient;Family member/caregiver    Family Member Consulted wife           Patient will benefit from skilled therapeutic intervention in order to improve the following deficits and impairments:  Abnormal gait,Decreased coordination,Difficulty walking,Decreased safety awareness,Decreased balance,Decreased strength,Decreased mobility  Visit Diagnosis: Ataxic gait  Abnormal posture  Other abnormalities of gait and mobility  Unsteadiness on feet  History of falling  Muscle weakness (generalized)     Problem List Patient Active Problem List   Diagnosis Date Noted  . Left hemiparesis ([place]) 12/06/2020  . Benign essential HTN   . Cognitive deficit, post-stroke   . Controlled type 2 diabetes mellitus with hyperglycemia, without  long-term current use of insulin ([name])   . 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 Dyslipidemia, goal LDL below 70 03/12/2019  . Dysphagia due to recent stroke 03/12/2019  . 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 Abnormality of gait   . Gait disturbance, post-stroke   . 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    Sharma Covert, DPT 01/20/2021, 2:55 PM  [place]. [place], [place], [address]  Phone: [phone]   Fax:  [phone]  Name: Joe Perry Sr. MRN: [id number] Date of Birth: Jan 28, 1951

## 2021-01-24 ENCOUNTER — Encounter: Payer: Self-pay | Admitting: Physical Therapy

## 2021-01-24 ENCOUNTER — Ambulatory Visit: Payer: Medicare Other | Admitting: Physical Therapy

## 2021-01-24 ENCOUNTER — Other Ambulatory Visit: Payer: Self-pay

## 2021-01-24 ENCOUNTER — Encounter: Payer: Self-pay | Admitting: Speech Pathology

## 2021-01-24 ENCOUNTER — Ambulatory Visit: Payer: Medicare Other | Admitting: Speech Pathology

## 2021-01-24 DIAGNOSIS — R4701 Aphasia: Secondary | ICD-10-CM | POA: Diagnosis not present

## 2021-01-24 DIAGNOSIS — R260 Ataxic gait: Secondary | ICD-10-CM

## 2021-01-24 DIAGNOSIS — R293 Abnormal posture: Secondary | ICD-10-CM

## 2021-01-24 DIAGNOSIS — R2689 Other abnormalities of gait and mobility: Secondary | ICD-10-CM | POA: Diagnosis not present

## 2021-01-24 DIAGNOSIS — M6281 Muscle weakness (generalized): Secondary | ICD-10-CM

## 2021-01-24 DIAGNOSIS — R41841 Cognitive communication deficit: Secondary | ICD-10-CM

## 2021-01-24 DIAGNOSIS — I6329 Cerebral infarction due to unspecified occlusion or stenosis of other precerebral arteries: Secondary | ICD-10-CM | POA: Diagnosis not present

## 2021-01-24 DIAGNOSIS — Z9181 History of falling: Secondary | ICD-10-CM | POA: Diagnosis not present

## 2021-01-24 DIAGNOSIS — R2681 Unsteadiness on feet: Secondary | ICD-10-CM | POA: Diagnosis not present

## 2021-01-24 NOTE — Therapy (Signed)
[place]. [place], [place], [address] Phone: [phone]   Fax:  [phone]  Physical Therapy Treatment  Patient Details  Name: Joe HUELSMANN Sr. MRN: [id number] Date of Birth: 09/08/1950 Referring Provider (PT): Asencion Noble, MD   Encounter Date: 01/24/2021   PT End of Session - 01/24/21 1629    Visit Number 18    Date for PT Re-Evaluation 02/07/21    Authorization Type UHC Medicare    PT Start Time 2992    PT Stop Time 1530    PT Time Calculation (min) 45 min    Activity Tolerance Patient tolerated treatment well    Behavior During Therapy Flat affect           Past Medical History:  Diagnosis Date  . 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 ([place]) 06/03/2019  . Cerebrovascular accident (CVA) due to occlusion of vertebral artery ([place])   . Diabetes mellitus without complication ([place])   . Dysphagia    post stroke  . Hypertension   . Stroke [facility])     Past Surgical History:  Procedure Laterality Date  . IR ANGIO INTRA EXTRACRAN SEL COM CAROTID INNOMINATE UNI L MOD SED  03/10/2019  . IR ANGIO VERTEBRAL SEL SUBCLAVIAN INNOMINATE BILAT MOD SED  03/10/2019  . IR CT HEAD LTD  03/10/2019  . IR PERCUTANEOUS ART THROMBECTOMY/INFUSION INTRACRANIAL INC DIAG ANGIO  03/10/2019  . RADIOLOGY WITH ANESTHESIA N/A 03/10/2019   Procedure: IR WITH ANESTHESIA/CODE STROKE;  Surgeon: Radiologist, Medication, MD;  Location: [place];  Service: Radiology;  Laterality: N/A;    There were no vitals filed for this visit.   Subjective Assessment - 01/24/21 1449    Subjective No big changes    Currently in Pain? No/denies                             [facility] Adult PT Treatment/Exercise - 01/24/21 0001      Ambulation/Gait   Gait Comments HHA walk 2 full laps one at a time, cues for head up and for big steps with the left      High Level Balance   High Level Balance  Comments standing reaching, ball toss and catch, him iwthout holding on and him with one hand on the walker      Knee/Hip Exercises: Aerobic   Nustep level 4 x 6 minutes UE and LE      Knee/Hip Exercises: Standing   Hip Abduction Both;2 sets;10 reps    Abduction Limitations ces for control of the left leg, tends to lock it when standing and then minimal control with the active motions    Other Standing Knee Exercises back to wall overhead reach working on posture and stability of the posterior mms, then with mirror haivng him correct to mid line and hold, then some reaching with the right and left arms      Knee/Hip Exercises: Seated   Other Seated Knee/Hip Exercises Seated fitter leg presses LLE only 10 x 2 B, work on controlling the motions                    PT Short Term Goals - 12/23/20 1748      PT SHORT TERM GOAL #1   Title Pt will perform HEP with wife's assistance, for improved strength, balance, gait.    Time 4    Period Weeks  Status On-going   wife reports difficulty with getting [name] to do exercises at home            PT Long Term Goals - 01/24/21 1637      PT LONG TERM GOAL #2   Title Pt will improve 5TSTS score to </= 20 seconds to demonstrate improved functional strength and decreased fall risk    Status On-going      PT LONG TERM GOAL #3   Title Pt will improve TUG score to less than or equal to 25 seconds for decreased fall risk.    Status On-going                 Plan - 01/24/21 1629    Clinical Impression Statement I worked on him trying to get to midline with me mirroring in front of him, weight shifts to the left and then using a mirror so he can see and we can verbalize to him how to correct, I also had him use the left arm to reach while standing mostly reaching to the left side.  I used the wall today for posture and balancewith trying to get him to have some bend int he left knee, he does snap this leg back so I went back to the  fitter and with cues he does very well with the control, just with weight bearing does he stap it back    PT Next Visit Plan continue to work on his awareness of posture and work on this, also look to see if we can get better control of the left knee with walking    Consulted and Agree with Plan of Care Patient;Family member/caregiver    Family Member Consulted wife           Patient will benefit from skilled therapeutic intervention in order to improve the following deficits and impairments:  Abnormal gait,Decreased coordination,Difficulty walking,Decreased safety awareness,Decreased balance,Decreased strength,Decreased mobility  Visit Diagnosis: Ataxic gait  Abnormal posture  Other abnormalities of gait and mobility  Unsteadiness on feet  History of falling  Muscle weakness (generalized)  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 [facility])     Problem List Patient Active Problem List   Diagnosis Date Noted  . Left hemiparesis ([place]) 12/06/2020  . Benign essential HTN   . Cognitive deficit, post-stroke   . Controlled type 2 diabetes mellitus with hyperglycemia, without long-term current use of insulin ([place])   . 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 Dyslipidemia, goal LDL below 70 03/12/2019  . Dysphagia due to recent stroke 03/12/2019  . 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 Abnormality of gait   . Gait disturbance, post-stroke   . 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    Sumner Boast., PT 01/24/2021, 4:38 PM  [place]. [place], [place], [address] Phone: [phone]   Fax:  [phone]  Name: Joe POTE Sr. MRN: [id number] Date of Birth: 18-Oct-1950

## 2021-01-24 NOTE — Therapy (Signed)
[place]. [place], [place], [address] Phone: [phone]   Fax:  [phone]  Speech Language Pathology Treatment  Patient Details  Name: Joe OSBORN Sr. MRN: [id number] Date of Birth: March 16, 1951 Referring Provider (SLP): Elsie Stain, MD   Encounter Date: 01/24/2021   End of Session - 01/24/21 1407    Visit Number 26    Number of Visits 25   Asking for 16 visits x2/week for 8 weeks.   Date for SLP Re-Evaluation 03/22/21    Authorization Time Period 03/22/21    Authorization - Number of Visits 17    SLP Start Time 1400    SLP Stop Time  3295    SLP Time Calculation (min) 42 min    Activity Tolerance Patient tolerated treatment well;Patient limited by fatigue           Past Medical History:  Diagnosis Date  . 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 ([place]) 06/03/2019  . Cerebrovascular accident (CVA) due to occlusion of vertebral artery ([place])   . Diabetes mellitus without complication ([place])   . Dysphagia    post stroke  . Hypertension   . Stroke [facility])     Past Surgical History:  Procedure Laterality Date  . IR ANGIO INTRA EXTRACRAN SEL COM CAROTID INNOMINATE UNI L MOD SED  03/10/2019  . IR ANGIO VERTEBRAL SEL SUBCLAVIAN INNOMINATE BILAT MOD SED  03/10/2019  . IR CT HEAD LTD  03/10/2019  . IR PERCUTANEOUS ART THROMBECTOMY/INFUSION INTRACRANIAL INC DIAG ANGIO  03/10/2019  . RADIOLOGY WITH ANESTHESIA N/A 03/10/2019   Procedure: IR WITH ANESTHESIA/CODE STROKE;  Surgeon: Radiologist, Medication, MD;  Location: [place];  Service: Radiology;  Laterality: N/A;    There were no vitals filed for this visit.   Subjective Assessment - 01/24/21 1406    Subjective No changes. Pt is alert today.    Currently in Pain? No/denies                 ADULT SLP TREATMENT - 01/24/21 1432      General Information   Behavior/Cognition Alert;Cooperative;Pleasant  mood;Distractible;Decreased sustained attention      Treatment Provided   Treatment provided Cognitive-Linquistic      Cognitive-Linquistic Treatment   Treatment focused on Aphasia    Skilled Treatment Facilitated verbal expression by having pt name "everyday objects". Pt benefited from verbal cueing - cloze sentences and phonemic cueing to complete. Completed list x3; each time increase pt ease with word finding.      Progression Toward Goals   Progression toward goals Progressing toward goals              SLP Short Term Goals - 01/24/21 1409      SLP SHORT TERM GOAL #1   Title Pt will name 6/10 objects independenty over 3 sessions to increase expressive communication.    Time 4    Period Weeks    Status On-going   Pt is able to make a choice verbally, but may not correctly identify.     SLP SHORT TERM GOAL #2   Title Pt will follow through with swallowing saliva before speaking at home as reported by caregiver.    Time 4    Period Weeks    Status On-going      SLP SHORT TERM GOAL #3   Title Pt will demonstrate appropriate alternating attention between two tasks with <1 verbal/visual cue.    Time  4    Period Weeks    Status On-going            SLP Long Term Goals - 01/24/21 1416      SLP LONG TERM GOAL #1   Title Client will utilize compensatory strategies to communicate wants and needs effectively to different conversational partners and participate socially in functional living environment.    Time 8    Period Weeks    Status On-going      SLP LONG TERM GOAL #2   Title Client will demonstrate understanding of verbal communication related to daily activities and utilize compensatory strategies to more effectively communicate in their functional living environment.    Time 8    Period Weeks    Status On-going      SLP LONG TERM GOAL #3   Title Patient will develop functional attention skills to effectively attend to and communicate in tasks of daily living in  their functional living environment.    Time 8    Period Weeks    Status On-going            Plan - 01/24/21 1443    Clinical Impression Statement Pt worked on naming basic objects around the house. Pt required mod-max verbal cueing.    Speech Therapy Frequency 2x / week    Duration 12 weeks    Treatment/Interventions Aspiration precaution training;Cueing hierarchy;Patient/family education;Functional tasks;Environmental controls;Cognitive reorganization;Multimodal communcation approach;Language facilitation;Compensatory techniques;Internal/external aids;SLP instruction and feedback;Compensatory strategies    Potential to Achieve Goals Fair    Potential Considerations Ability to learn/carryover information;Severity of impairments    Consulted and Agree with Plan of Care Patient;Family member/caregiver           Patient will benefit from skilled therapeutic intervention in order to improve the following deficits and impairments:   Aphasia  Cognitive communication deficit    Problem List Patient Active Problem List   Diagnosis Date Noted  . Left hemiparesis ([place]) 12/06/2020  . Benign essential HTN   . Cognitive deficit, post-stroke   . Controlled type 2 diabetes mellitus with hyperglycemia, without long-term current use of insulin ([place])   . 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 Dyslipidemia, goal LDL below 70 03/12/2019  . Dysphagia due to recent stroke 03/12/2019  . 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 Abnormality of gait   . Gait disturbance, post-stroke   . 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    [name], [place], [name]  01/24/2021, 2:44 PM  [place]. [place], [place], [address] Phone: [phone]   Fax:  [phone]   Name: Joe CHISENHALL Sr. MRN: [id number] Date of Birth: [birth year]

## 2021-01-27 ENCOUNTER — Encounter: Payer: Self-pay | Admitting: Speech Pathology

## 2021-01-27 ENCOUNTER — Other Ambulatory Visit: Payer: Self-pay

## 2021-01-27 ENCOUNTER — Ambulatory Visit: Payer: Medicare Other | Admitting: Speech Pathology

## 2021-01-27 DIAGNOSIS — I6329 Cerebral infarction due to unspecified occlusion or stenosis of other precerebral arteries: Secondary | ICD-10-CM | POA: Diagnosis not present

## 2021-01-27 DIAGNOSIS — R2681 Unsteadiness on feet: Secondary | ICD-10-CM | POA: Diagnosis not present

## 2021-01-27 DIAGNOSIS — Z9181 History of falling: Secondary | ICD-10-CM | POA: Diagnosis not present

## 2021-01-27 DIAGNOSIS — R4701 Aphasia: Secondary | ICD-10-CM

## 2021-01-27 DIAGNOSIS — M6281 Muscle weakness (generalized): Secondary | ICD-10-CM | POA: Diagnosis not present

## 2021-01-27 DIAGNOSIS — R41841 Cognitive communication deficit: Secondary | ICD-10-CM

## 2021-01-27 DIAGNOSIS — R293 Abnormal posture: Secondary | ICD-10-CM | POA: Diagnosis not present

## 2021-01-27 DIAGNOSIS — R2689 Other abnormalities of gait and mobility: Secondary | ICD-10-CM | POA: Diagnosis not present

## 2021-01-27 DIAGNOSIS — R260 Ataxic gait: Secondary | ICD-10-CM | POA: Diagnosis not present

## 2021-01-27 NOTE — Therapy (Signed)
[place]. [place], [place], [address] Phone: [phone]   Fax:  [phone]  Speech Language Pathology Treatment  Patient Details  Name: Joe OPIELA Sr. MRN: [id number] Date of Birth: 1950-09-13 Referring Provider (SLP): Elsie Stain, MD   Encounter Date: 01/27/2021   End of Session - 01/27/21 1527    Visit Number 27    Number of Visits --   Asking for 16 visits x2/week for 8 weeks.   Date for SLP Re-Evaluation 03/22/21    Authorization Time Period 03/22/21    Authorization - Number of Visits 17    SLP Start Time 1450    SLP Stop Time  1530    SLP Time Calculation (min) 40 min    Activity Tolerance Patient tolerated treatment well;Patient limited by fatigue           Past Medical History:  Diagnosis Date  . 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 ([place]) 06/03/2019  . Cerebrovascular accident (CVA) due to occlusion of vertebral artery ([place])   . Diabetes mellitus without complication ([place])   . Dysphagia    post stroke  . Hypertension   . Stroke [facility])     Past Surgical History:  Procedure Laterality Date  . IR ANGIO INTRA EXTRACRAN SEL COM CAROTID INNOMINATE UNI L MOD SED  03/10/2019  . IR ANGIO VERTEBRAL SEL SUBCLAVIAN INNOMINATE BILAT MOD SED  03/10/2019  . IR CT HEAD LTD  03/10/2019  . IR PERCUTANEOUS ART THROMBECTOMY/INFUSION INTRACRANIAL INC DIAG ANGIO  03/10/2019  . RADIOLOGY WITH ANESTHESIA N/A 03/10/2019   Procedure: IR WITH ANESTHESIA/CODE STROKE;  Surgeon: Radiologist, Medication, MD;  Location: [facility];  Service: Radiology;  Laterality: N/A;    There were no vitals filed for this visit.   Subjective Assessment - 01/27/21 1456    Subjective Pt said "hmm hmm hmm" w/ intonation when SLP asked, "What's been going on?". SLP had to cue pt to open mouth to answer, then pt reported, "Nothing has been going on".    Currently in Pain? No/denies                  ADULT SLP TREATMENT - 01/27/21 1458      General Information   Behavior/Cognition Alert;Cooperative;Pleasant mood;Distractible;Decreased sustained attention      Treatment Provided   Treatment provided Cognitive-Linquistic      Cognitive-Linquistic Treatment   Treatment focused on Aphasia;Cognition    Skilled Treatment Pt was able to independently write the year and month. Facilitated organization by having pt name parts of a car. Pt enjoys mechanics and used to teach mechanics at [employer].      Assessment / Recommendations / Plan   Plan Continue with current plan of care      Progression Toward Goals   Progression toward goals Progressing toward goals              SLP Short Term Goals - 01/27/21 1528      SLP SHORT TERM GOAL #1   Title Pt will name 6/10 objects independenty over 3 sessions to increase expressive communication.    Time 3    Period Weeks    Status On-going   Pt is able to make a choice verbally, but may not correctly identify.     SLP SHORT TERM GOAL #2   Title Pt will follow through with swallowing saliva before speaking at home as reported by caregiver.  Time 3    Period Weeks    Status On-going      SLP SHORT TERM GOAL #3   Title Pt will demonstrate appropriate alternating attention between two tasks with <1 verbal/visual cue.    Time 3    Period Weeks    Status On-going            SLP Long Term Goals - 01/27/21 1528      SLP LONG TERM GOAL #1   Title Client will utilize compensatory strategies to communicate wants and needs effectively to different conversational partners and participate socially in functional living environment.    Time 7    Period Weeks    Status On-going      SLP LONG TERM GOAL #2   Title Client will demonstrate understanding of verbal communication related to daily activities and utilize compensatory strategies to more effectively communicate in their functional living environment.    Time 7    Period  Weeks    Status On-going      SLP LONG TERM GOAL #3   Title Patient will develop functional attention skills to effectively attend to and communicate in tasks of daily living in their functional living environment.    Time 7    Period Weeks    Status On-going            Plan - 01/27/21 1527    Clinical Impression Statement Generative naming with parts of car. Pt required mod verbal cueing for encouragement.    Speech Therapy Frequency 2x / week    Duration 12 weeks    Treatment/Interventions Aspiration precaution training;Cueing hierarchy;Patient/family education;Functional tasks;Environmental controls;Cognitive reorganization;Multimodal communcation approach;Language facilitation;Compensatory techniques;Internal/external aids;SLP instruction and feedback;Compensatory strategies    Potential to Achieve Goals Fair    Potential Considerations Ability to learn/carryover information;Severity of impairments    Consulted and Agree with Plan of Care Patient;Family member/caregiver           Patient will benefit from skilled therapeutic intervention in order to improve the following deficits and impairments:   Aphasia  Cognitive communication deficit    Problem List Patient Active Problem List   Diagnosis Date Noted  . Left hemiparesis ([place]) 12/06/2020  . Benign essential HTN   . Cognitive deficit, post-stroke   . Controlled type 2 diabetes mellitus with hyperglycemia, without long-term current use of insulin ([place])   . 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 Dyslipidemia, goal LDL below 70 03/12/2019  . Dysphagia due to recent stroke 03/12/2019  . 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 Abnormality of gait   . Gait disturbance, post-stroke   . 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    Joe Perry, [name], [name]  01/27/2021,  3:29 PM  [place]. [place], [place], [address] Phone: [phone]   Fax:  [phone]   Name: Joe Perry Sr. MRN: [id number] Date of Birth: 30-Jan-1951

## 2021-02-01 ENCOUNTER — Encounter: Payer: Self-pay | Admitting: Speech Pathology

## 2021-02-01 ENCOUNTER — Ambulatory Visit: Payer: Medicare Other | Admitting: Speech Pathology

## 2021-02-01 ENCOUNTER — Other Ambulatory Visit: Payer: Self-pay

## 2021-02-01 DIAGNOSIS — R4701 Aphasia: Secondary | ICD-10-CM | POA: Diagnosis not present

## 2021-02-01 DIAGNOSIS — I6329 Cerebral infarction due to unspecified occlusion or stenosis of other precerebral arteries: Secondary | ICD-10-CM | POA: Diagnosis not present

## 2021-02-01 DIAGNOSIS — R41841 Cognitive communication deficit: Secondary | ICD-10-CM

## 2021-02-01 DIAGNOSIS — R2681 Unsteadiness on feet: Secondary | ICD-10-CM | POA: Diagnosis not present

## 2021-02-01 DIAGNOSIS — R260 Ataxic gait: Secondary | ICD-10-CM | POA: Diagnosis not present

## 2021-02-01 DIAGNOSIS — R293 Abnormal posture: Secondary | ICD-10-CM | POA: Diagnosis not present

## 2021-02-01 DIAGNOSIS — Z9181 History of falling: Secondary | ICD-10-CM | POA: Diagnosis not present

## 2021-02-01 DIAGNOSIS — M6281 Muscle weakness (generalized): Secondary | ICD-10-CM | POA: Diagnosis not present

## 2021-02-01 DIAGNOSIS — R2689 Other abnormalities of gait and mobility: Secondary | ICD-10-CM | POA: Diagnosis not present

## 2021-02-01 NOTE — Therapy (Signed)
[place]. [place], [place], [address] Phone: [phone]   Fax:  [phone]  Speech Language Pathology Treatment  Patient Details  Name: Joe GUEYE Sr. MRN: [id number] Date of Birth: 1951-05-24 Referring Provider (SLP): Elsie Stain, MD   Encounter Date: 02/01/2021   End of Session - 02/01/21 1500    Visit Number 28    Number of Visits --   Asking for 16 visits x2/week for 8 weeks.   Date for SLP Re-Evaluation 03/22/21    Authorization Time Period 03/22/21    Authorization - Visit Number 3    Authorization - Number of Visits 17    SLP Start Time 5053    SLP Stop Time  1525    SLP Time Calculation (min) 40 min    Activity Tolerance Patient tolerated treatment well;Patient limited by fatigue           Past Medical History:  Diagnosis Date  . 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 ([place]) 06/03/2019  . Cerebrovascular accident (CVA) due to occlusion of vertebral artery ([place])   . Diabetes mellitus without complication ([place])   . Dysphagia    post stroke  . Hypertension   . Stroke [facility])     Past Surgical History:  Procedure Laterality Date  . IR ANGIO INTRA EXTRACRAN SEL COM CAROTID INNOMINATE UNI L MOD SED  03/10/2019  . IR ANGIO VERTEBRAL SEL SUBCLAVIAN INNOMINATE BILAT MOD SED  03/10/2019  . IR CT HEAD LTD  03/10/2019  . IR PERCUTANEOUS ART THROMBECTOMY/INFUSION INTRACRANIAL INC DIAG ANGIO  03/10/2019  . RADIOLOGY WITH ANESTHESIA N/A 03/10/2019   Procedure: IR WITH ANESTHESIA/CODE STROKE;  Surgeon: Radiologist, Medication, MD;  Location: [facility];  Service: Radiology;  Laterality: N/A;    There were no vitals filed for this visit.   Subjective Assessment - 02/01/21 1500    Subjective No changes to report. Pt was more alert today and provided a verbal answer to SLP's greeting.    Currently in Pain? No/denies                 ADULT SLP TREATMENT -  02/01/21 1521      General Information   Behavior/Cognition Alert;Cooperative;Pleasant mood;Distractible;Decreased sustained attention      Treatment Provided   Treatment provided Cognitive-Linquistic      Cognitive-Linquistic Treatment   Treatment focused on Aphasia;Cognition    Skilled Treatment Facilitated verbal expression and organiation by generative naming. Pt was able to name 9/10 car parts independently and 5 car models independently, 5 more given phonemic cues. Pt was able to name 6 fruit independently.      Assessment / Recommendations / Plan   Plan Continue with current plan of care      Progression Toward Goals   Progression toward goals Progressing toward goals              SLP Short Term Goals - 02/01/21 1519      SLP SHORT TERM GOAL #1   Title Pt will name 6/10 objects independenty over 3 sessions to increase expressive communication.    Time 3    Period Weeks    Status On-going   Pt is able to make a choice verbally, but may not correctly identify.     SLP SHORT TERM GOAL #2   Title Pt will follow through with swallowing saliva before speaking at home as reported by caregiver.    Time 3  Period Weeks    Status On-going      SLP SHORT TERM GOAL #3   Title Pt will demonstrate appropriate alternating attention between two tasks with <1 verbal/visual cue.    Time 3    Period Weeks    Status On-going            SLP Long Term Goals - 02/01/21 1520      SLP LONG TERM GOAL #1   Title Client will utilize compensatory strategies to communicate wants and needs effectively to different conversational partners and participate socially in functional living environment.    Time 7    Period Weeks    Status On-going      SLP LONG TERM GOAL #2   Title Client will demonstrate understanding of verbal communication related to daily activities and utilize compensatory strategies to more effectively communicate in their functional living environment.    Time 7     Period Weeks    Status On-going      SLP LONG TERM GOAL #3   Title Patient will develop functional attention skills to effectively attend to and communicate in tasks of daily living in their functional living environment.    Time 7    Period Weeks    Status On-going            Plan - 02/01/21 1527    Clinical Impression Statement Generative naming with parts of car. Pt required mod verbal cueing. He benefits from phonemic cueing. Voice was much louder this session and required less cueing to verbally answer therapist.    Speech Therapy Frequency 2x / week    Duration 12 weeks    Treatment/Interventions Aspiration precaution training;Cueing hierarchy;Patient/family education;Functional tasks;Environmental controls;Cognitive reorganization;Multimodal communcation approach;Language facilitation;Compensatory techniques;Internal/external aids;SLP instruction and feedback;Compensatory strategies    Potential to Achieve Goals Fair    Potential Considerations Ability to learn/carryover information;Severity of impairments    Consulted and Agree with Plan of Care Patient;Family member/caregiver           Patient will benefit from skilled therapeutic intervention in order to improve the following deficits and impairments:   Aphasia  Cognitive communication deficit    Problem List Patient Active Problem List   Diagnosis Date Noted  . Left hemiparesis ([place]) 12/06/2020  . Benign essential HTN   . Cognitive deficit, post-stroke   . Controlled type 2 diabetes mellitus with hyperglycemia, without long-term current use of insulin ([place])   . 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 Dyslipidemia, goal LDL below 70 03/12/2019  . Dysphagia due to recent stroke 03/12/2019  . 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 Abnormality of  gait   . Gait disturbance, post-stroke   . 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    [name], [place], [name]  02/01/2021, 3:27 PM  [place]. [place], [place], [address] Phone: [phone]   Fax:  [phone]   Name: Joe NEST Sr. MRN: [id number] Date of Birth: Nov 23, 1950

## 2021-02-03 ENCOUNTER — Encounter: Payer: Self-pay | Admitting: Speech Pathology

## 2021-02-03 ENCOUNTER — Other Ambulatory Visit: Payer: Self-pay

## 2021-02-03 ENCOUNTER — Ambulatory Visit: Payer: Medicare Other | Attending: Critical Care Medicine | Admitting: Speech Pathology

## 2021-02-03 DIAGNOSIS — Z9181 History of falling: Secondary | ICD-10-CM | POA: Diagnosis not present

## 2021-02-03 DIAGNOSIS — R4701 Aphasia: Secondary | ICD-10-CM | POA: Diagnosis not present

## 2021-02-03 DIAGNOSIS — R2681 Unsteadiness on feet: Secondary | ICD-10-CM | POA: Diagnosis not present

## 2021-02-03 DIAGNOSIS — R41842 Visuospatial deficit: Secondary | ICD-10-CM | POA: Insufficient documentation

## 2021-02-03 DIAGNOSIS — R278 Other lack of coordination: Secondary | ICD-10-CM | POA: Insufficient documentation

## 2021-02-03 DIAGNOSIS — R41841 Cognitive communication deficit: Secondary | ICD-10-CM | POA: Insufficient documentation

## 2021-02-03 DIAGNOSIS — M6281 Muscle weakness (generalized): Secondary | ICD-10-CM | POA: Insufficient documentation

## 2021-02-03 DIAGNOSIS — I6329 Cerebral infarction due to unspecified occlusion or stenosis of other precerebral arteries: Secondary | ICD-10-CM | POA: Diagnosis not present

## 2021-02-03 DIAGNOSIS — R293 Abnormal posture: Secondary | ICD-10-CM | POA: Insufficient documentation

## 2021-02-03 DIAGNOSIS — I69319 Unspecified symptoms and signs involving cognitive functions following cerebral infarction: Secondary | ICD-10-CM | POA: Diagnosis not present

## 2021-02-03 DIAGNOSIS — R41844 Frontal lobe and executive function deficit: Secondary | ICD-10-CM | POA: Insufficient documentation

## 2021-02-03 DIAGNOSIS — R260 Ataxic gait: Secondary | ICD-10-CM | POA: Diagnosis not present

## 2021-02-03 DIAGNOSIS — R2689 Other abnormalities of gait and mobility: Secondary | ICD-10-CM | POA: Diagnosis not present

## 2021-02-03 NOTE — Therapy (Signed)
[place]. [place], [place], [address] Phone: [phone]   Fax:  [phone]  Speech Language Pathology Treatment  Patient Details  Name: Joe THELIN Sr. MRN: [id number] Date of Birth: 23-Jun-1951 Referring Provider (SLP): Elsie Stain, MD   Encounter Date: 02/03/2021   End of Session - 02/03/21 1538    Visit Number 29    Number of Visits --   Asking for 16 visits x2/week for 8 weeks.   Date for SLP Re-Evaluation 03/22/21    Authorization Time Period 03/22/21    Authorization - Visit Number 4    Authorization - Number of Visits 17    SLP Start Time 1062    SLP Stop Time  1610    SLP Time Calculation (min) 40 min    Activity Tolerance Patient tolerated treatment well;Patient limited by fatigue           Past Medical History:  Diagnosis Date  . 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 ([place]) 06/03/2019  . Cerebrovascular accident (CVA) due to occlusion of vertebral artery ([place])   . Diabetes mellitus without complication ([place])   . Dysphagia    post stroke  . Hypertension   . Stroke [facility]     Past Surgical History:  Procedure Laterality Date  . IR ANGIO INTRA EXTRACRAN SEL COM CAROTID INNOMINATE UNI L MOD SED  03/10/2019  . IR ANGIO VERTEBRAL SEL SUBCLAVIAN INNOMINATE BILAT MOD SED  03/10/2019  . IR CT HEAD LTD  03/10/2019  . IR PERCUTANEOUS ART THROMBECTOMY/INFUSION INTRACRANIAL INC DIAG ANGIO  03/10/2019  . RADIOLOGY WITH ANESTHESIA N/A 03/10/2019   Procedure: IR WITH ANESTHESIA/CODE STROKE;  Surgeon: Radiologist, Medication, MD;  Location: [place];  Service: Radiology;  Laterality: N/A;    There were no vitals filed for this visit.   Subjective Assessment - 02/03/21 1537    Subjective Pt spoke out loud for the first time in the waiting room today - a nice, loud "Yes". Wife was excited.    Patient is accompained by: Family member    Currently in Pain?  No/denies                 ADULT SLP TREATMENT - 02/03/21 1609      General Information   Behavior/Cognition Alert;Cooperative;Pleasant mood;Distractible;Decreased sustained attention      Treatment Provided   Treatment provided Cognitive-Linquistic      Cognitive-Linquistic Treatment   Treatment focused on Aphasia;Cognition    Skilled Treatment Facliated verbal expression by having pt generate sentences using a single word. Pt was able to generate several sentences independently. Facilitated attention by addressing sequencing with pegs.      Assessment / Recommendations / Plan   Plan Continue with current plan of care              SLP Short Term Goals - 02/03/21 1607      SLP SHORT TERM GOAL #1   Title Pt will name 6/10 objects independenty over 3 sessions to increase expressive communication.    Time 2    Period Weeks    Status On-going   Pt is able to make a choice verbally, but may not correctly identify.     SLP SHORT TERM GOAL #2   Title Pt will follow through with swallowing saliva before speaking at home as reported by caregiver.    Time 2    Period Weeks    Status On-going  SLP SHORT TERM GOAL #3   Title Pt will demonstrate appropriate alternating attention between two tasks with <1 verbal/visual cue.    Time 2    Period Weeks    Status On-going            SLP Long Term Goals - 02/03/21 1608      SLP LONG TERM GOAL #1   Title Client will utilize compensatory strategies to communicate wants and needs effectively to different conversational partners and participate socially in functional living environment.    Time 6    Period Weeks    Status On-going      SLP LONG TERM GOAL #2   Title Client will demonstrate understanding of verbal communication related to daily activities and utilize compensatory strategies to more effectively communicate in their functional living environment.    Time 6    Period Weeks    Status On-going      SLP LONG  TERM GOAL #3   Title Patient will develop functional attention skills to effectively attend to and communicate in tasks of daily living in their functional living environment.    Time 6    Period Weeks    Status On-going            Plan - 02/03/21 1603    Clinical Impression Statement Pt was able to generate sentences from a single word; 9/10 sentences used the word appropriately. Pt is now swallowing saliva independently with therapist in room before answering questions. Louder vocal intensity noted this session.    Speech Therapy Frequency 2x / week    Duration 12 weeks    Treatment/Interventions Aspiration precaution training;Cueing hierarchy;Patient/family education;Functional tasks;Environmental controls;Cognitive reorganization;Multimodal communcation approach;Language facilitation;Compensatory techniques;Internal/external aids;SLP instruction and feedback;Compensatory strategies    Potential to Achieve Goals Fair    Potential Considerations Ability to learn/carryover information;Severity of impairments    Consulted and Agree with Plan of Care Patient;Family member/caregiver           Patient will benefit from skilled therapeutic intervention in order to improve the following deficits and impairments:   Aphasia  Cognitive communication deficit    Problem List Patient Active Problem List   Diagnosis Date Noted  . Left hemiparesis ([place]) 12/06/2020  . Benign essential HTN   . Cognitive deficit, post-stroke   . Controlled type 2 diabetes mellitus with hyperglycemia, without long-term current use of insulin ([place])   . 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 Dyslipidemia, goal LDL below 70 03/12/2019  . Dysphagia due to recent stroke 03/12/2019  . 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 Abnormality of gait   .  Gait disturbance, post-stroke   . 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    [name], [place], [name]  02/03/2021, 4:17 PM  [place]. [place], [place], [address] Phone: [phone]   Fax:  [phone]   Name: Joe AHLGREN Sr. MRN: [id number] Date of Birth: 07/10/1951

## 2021-02-08 ENCOUNTER — Ambulatory Visit: Payer: Medicare Other | Admitting: Speech Pathology

## 2021-02-08 ENCOUNTER — Ambulatory Visit: Payer: Medicare Other

## 2021-02-08 ENCOUNTER — Encounter: Payer: Self-pay | Admitting: Speech Pathology

## 2021-02-08 ENCOUNTER — Other Ambulatory Visit: Payer: Self-pay

## 2021-02-08 DIAGNOSIS — R260 Ataxic gait: Secondary | ICD-10-CM | POA: Diagnosis not present

## 2021-02-08 DIAGNOSIS — R41841 Cognitive communication deficit: Secondary | ICD-10-CM

## 2021-02-08 DIAGNOSIS — I6329 Cerebral infarction due to unspecified occlusion or stenosis of other precerebral arteries: Secondary | ICD-10-CM | POA: Diagnosis not present

## 2021-02-08 DIAGNOSIS — R278 Other lack of coordination: Secondary | ICD-10-CM | POA: Diagnosis not present

## 2021-02-08 DIAGNOSIS — R293 Abnormal posture: Secondary | ICD-10-CM

## 2021-02-08 DIAGNOSIS — Z9181 History of falling: Secondary | ICD-10-CM | POA: Diagnosis not present

## 2021-02-08 DIAGNOSIS — I69319 Unspecified symptoms and signs involving cognitive functions following cerebral infarction: Secondary | ICD-10-CM | POA: Diagnosis not present

## 2021-02-08 DIAGNOSIS — R2689 Other abnormalities of gait and mobility: Secondary | ICD-10-CM | POA: Diagnosis not present

## 2021-02-08 DIAGNOSIS — R4701 Aphasia: Secondary | ICD-10-CM

## 2021-02-08 DIAGNOSIS — R2681 Unsteadiness on feet: Secondary | ICD-10-CM

## 2021-02-08 DIAGNOSIS — M6281 Muscle weakness (generalized): Secondary | ICD-10-CM

## 2021-02-08 NOTE — Therapy (Signed)
[place]. [place], [place], [address] Phone: [phone]   Fax:  [phone]  Speech Language Pathology Treatment  Patient Details  Name: Joe Perry Sr. MRN: [id number] Date of Birth: [birth year] Referring Provider (SLP): Elsie Stain, MD   Encounter Date: 02/08/2021   End of Session - 02/08/21 1510    Visit Number 30    Number of Visits --   Asking for 16 visits x2/week for 8 weeks.   Date for SLP Re-Evaluation 03/22/21    Authorization Time Period 03/22/21    Authorization - Visit Number 5    Authorization - Number of Visits 17    SLP Start Time 8032    SLP Stop Time  1525    SLP Time Calculation (min) 40 min    Activity Tolerance Patient tolerated treatment well;Patient limited by fatigue           Past Medical History:  Diagnosis Date  . 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 ([place]) 06/03/2019  . Cerebrovascular accident (CVA) due to occlusion of vertebral artery ([place])   . Diabetes mellitus without complication ([place])   . Dysphagia    post stroke  . Hypertension   . Stroke [facility])     Past Surgical History:  Procedure Laterality Date  . IR ANGIO INTRA EXTRACRAN SEL COM CAROTID INNOMINATE UNI L MOD SED  03/10/2019  . IR ANGIO VERTEBRAL SEL SUBCLAVIAN INNOMINATE BILAT MOD SED  03/10/2019  . IR CT HEAD LTD  03/10/2019  . IR PERCUTANEOUS ART THROMBECTOMY/INFUSION INTRACRANIAL INC DIAG ANGIO  03/10/2019  . RADIOLOGY WITH ANESTHESIA N/A 03/10/2019   Procedure: IR WITH ANESTHESIA/CODE STROKE;  Surgeon: Radiologist, Medication, MD;  Location: [place];  Service: Radiology;  Laterality: N/A;    There were no vitals filed for this visit.          ADULT SLP TREATMENT - 02/08/21 1509      General Information   Behavior/Cognition Alert;Cooperative;Pleasant mood;Distractible;Decreased sustained attention      Treatment Provided   Treatment provided  Cognitive-Linquistic      Cognitive-Linquistic Treatment   Treatment focused on Aphasia;Cognition    Skilled Treatment Facilitated attention by addressing 3-step sequences with pictures - required modA - verbal and gestural cueing. Trained pt in confrontational naming. Pt had difficulty with naming pictures. Requires multiple choice or phonemic cueing.      Assessment / Recommendations / Plan   Plan Continue with current plan of care      Progression Toward Goals   Progression toward goals Not progressing toward goals (comment)              SLP Short Term Goals - 02/08/21 1523      SLP SHORT TERM GOAL #1   Title Pt will name 6/10 objects independenty over 3 sessions to increase expressive communication.    Time 2    Period Weeks    Status On-going   Pt is able to make a choice verbally, but may not correctly identify.     SLP SHORT TERM GOAL #2   Title Pt will follow through with swallowing saliva before speaking at home as reported by caregiver.    Time 2    Period Weeks    Status On-going      SLP SHORT TERM GOAL #3   Title Pt will demonstrate appropriate alternating attention between two tasks with <1 verbal/visual cue.    Time 2  Period Weeks    Status On-going            SLP Long Term Goals - 02/08/21 1523      SLP LONG TERM GOAL #1   Title Client will utilize compensatory strategies to communicate wants and needs effectively to different conversational partners and participate socially in functional living environment.    Time 6    Period Weeks    Status On-going      SLP LONG TERM GOAL #2   Title Client will demonstrate understanding of verbal communication related to daily activities and utilize compensatory strategies to more effectively communicate in their functional living environment.    Time 6    Period Weeks    Status On-going      SLP LONG TERM GOAL #3   Title Patient will develop functional attention skills to effectively attend to and  communicate in tasks of daily living in their functional living environment.    Time 6    Period Weeks    Status On-going            Plan - 02/08/21 1522    Clinical Impression Statement Pt continues to have difficulty with confrontational naming. He benefits from a multiple choice cue.    Speech Therapy Frequency 2x / week    Duration 12 weeks    Treatment/Interventions Aspiration precaution training;Cueing hierarchy;Patient/family education;Functional tasks;Environmental controls;Cognitive reorganization;Multimodal communcation approach;Language facilitation;Compensatory techniques;Internal/external aids;SLP instruction and feedback;Compensatory strategies    Potential to Achieve Goals Fair    Potential Considerations Ability to learn/carryover information;Severity of impairments    Consulted and Agree with Plan of Care Patient;Family member/caregiver           Patient will benefit from skilled therapeutic intervention in order to improve the following deficits and impairments:   Aphasia  Cognitive communication deficit    Problem List Patient Active Problem List   Diagnosis Date Noted  . Left hemiparesis ([place]) 12/06/2020  . Benign essential HTN   . Cognitive deficit, post-stroke   . Controlled type 2 diabetes mellitus with hyperglycemia, without long-term current use of insulin ([place])   . 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 Dyslipidemia, goal LDL below 70 03/12/2019  . Dysphagia due to recent stroke 03/12/2019  . 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 Abnormality of gait   . Gait disturbance, post-stroke   . 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   Speech Therapy Progress Note  Dates of Reporting Period: 01/05/21 - 02/08/21  Objective Reports of Subjective Statement: Wife reports he speaks with friends at home, but still  does not speak to her.Pt had made improvements in verbal expression - needing limited cueing to answer therapists.Marland Kitchen SLP has rec OT.   Objective Measurements: See notes  Goal Update: Making improvements towards  Plan: Continue to address expressive language  Reason Skilled Services are Required: Pt requires SLP services due to limited ability to verbalize needs/wants. Continue as medical necessity.  [place], [place], [name]  02/08/2021, 3:28 PM  [place]. [place], [place], [address] Phone: [phone]   Fax:  [phone]   Name: RUFINO STAUP Sr. MRN: [id number] Date of Birth: 17-Jan-1951

## 2021-02-08 NOTE — Therapy (Signed)
[place]. [place], [place], [address] Phone: [phone]   Fax:  [phone]  Physical Therapy Treatment/Recertification Joe Perry note  Reporting Period 12/23/20 to 02/08/21  See note below for Objective Data and Assessment of Progress/Goals.       Patient Details  Name: Joe HALLISEY Sr. MRN: [id number] Date of Birth: [birth year] Referring Provider (PT): Asencion Noble, MD   Encounter Date: 02/08/2021   PT End of Session - 02/08/21 1744    Visit Number 50   Recert and PN completed on 02/08/21 visit # 19   Date for PT Re-Evaluation 03/11/21    Authorization Type [employer] Medicare    Activity Tolerance Patient tolerated treatment well    Behavior During Therapy Flat affect           Past Medical History:  Diagnosis Date  . 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 ([place]) 06/03/2019  . Cerebrovascular accident (CVA) due to occlusion of vertebral artery ([place])   . Diabetes mellitus without complication ([place])   . Dysphagia    post stroke  . Hypertension   . Stroke [facility])     Past Surgical History:  Procedure Laterality Date  . IR ANGIO INTRA EXTRACRAN SEL COM CAROTID INNOMINATE UNI L MOD SED  03/10/2019  . IR ANGIO VERTEBRAL SEL SUBCLAVIAN INNOMINATE BILAT MOD SED  03/10/2019  . IR CT HEAD LTD  03/10/2019  . IR PERCUTANEOUS ART THROMBECTOMY/INFUSION INTRACRANIAL INC DIAG ANGIO  03/10/2019  . RADIOLOGY WITH ANESTHESIA N/A 03/10/2019   Procedure: IR WITH ANESTHESIA/CODE STROKE;  Surgeon: Radiologist, Medication, MD;  Location: [place];  Service: Radiology;  Laterality: N/A;    There were no vitals filed for this visit.   Subjective Assessment - 02/08/21 1609    Subjective No big changes. Would like to keep practicing walking, strength    Patient is accompained by: Family member    Patient Stated Goals Pt's goals for therapy are to walk better. (pt agrees with wife's statement)     Currently in Pain? No/denies              [facility] PT Assessment - 02/08/21 0001      Assessment   Medical Diagnosis Cerebrovascular accident (CVA) due to thrombosis of precerebral artery,  Ataxia,  Abnormality of gait, Gait disturbance, post-stroke    Referring Provider (PT) Asencion Noble, MD      Standardized Balance Assessment   Five times sit to stand comments  28 sec with BUE supportat mat table      Timed Up and Go Test   Normal TUG (seconds) 44    TUG Comments with FWW   difficulty with turns                        [facility] Adult PT Treatment/Exercise - 02/08/21 0001      Ambulation/Gait   Gait Comments HHA walk 2 full laps one at a time, cues for head up and for big steps with the left. HHA x 2 walking around cones x 5 laps back and forth      Therapeutic Activites    Other Therapeutic Activities STS  10 x 3. Stnading marches B x 10 alt, RLE up and LLE stance x 10. Standing cone reach and place laterally and with rotation working on getting increased WS left  10 x 4.      Knee/Hip Exercises: Seated   Other Seated  Knee/Hip Exercises Seated fitter leg presses LLE only 10 x 2 B, work on controlling the motions           Scap retractions 3" x 10 in sitting initially needed manual assistance, able to complete with tactile cues between scapulae after.          PT Short Term Goals - 12/23/20 1748      PT SHORT TERM GOAL #1   Title Pt will perform HEP with wife's assistance, for improved strength, balance, gait.    Time 4    Period Weeks    Status On-going   wife reports difficulty with getting Mr Joe Perry to do exercises at home            PT Long Term Goals - 02/08/21 1630      PT LONG TERM GOAL #1   Title Pt will perform progressive HEP with family supervision and assistance for improved balance, strength, gait.    Status On-going      PT LONG TERM GOAL #2   Title Pt will improve 5TSTS score to </= 20 seconds to demonstrate improved  functional strength and decreased fall risk    Baseline Initial 54". 12/23/20: 35 seconds with arms of chair BUE support for push up. 02/08/21: 28Sec with BUE assist on mat to push up    Status On-going      PT LONG TERM GOAL #3   Title Pt will improve TUG score to less than or equal to 25 seconds for decreased fall risk.    Baseline Initial 59 sec. 12/23/20: 55 seconds. 02/08/21: 44 seconds - most difficulty and time with turns    Status On-going                 Plan - 02/08/21 1649    Clinical Impression Statement Pt making gradual progress towards goals with some improvement in TUG and 5TSTS scores today. He does still demo poor left knee control in dynamic stance but improved with sitting leg press exercises. Added some more postural strength today and difficulty initially but able to complete wiht tactile cueing. He will benefit from continued skilled PT to work on improving postural strength, balance, gait and Functional mobility.    Rehab Potential Fair    PT Frequency 1x / week    PT Duration 4 weeks    PT Treatment/Interventions ADLs/Self Care Home Management;DME Instruction;Neuromuscular re-education;Balance training;Therapeutic exercise;Therapeutic activities;Functional mobility training;Stair training;Gait training;Patient/family education;Energy conservation;Manual techniques    PT Next Visit Plan Review Hep exercises again with pt next visit to ensure proper carryover. continue to work on his awareness of posture and work on this, also look to see if we can get better control of the left knee with walking - possibly use ace wrap or TB wrapping technique to prevent hyperextension in stance during gait training    PT Home Exercise Plan Access Code: [email or web address]  URL: [email or web address]  Date: 02/08/2021  Prepared by: Sherlynn Stalls    Exercises  Supine Heel Slide - 1 x daily - 7 x weekly - 3 sets - 10 reps  Clamshell - 1 x daily - 7 x weekly - 3 sets - 10 reps  Supine  Bridge - 1 x daily - 7 x weekly - 3 sets - 10 reps  Seated March - 1 x daily - 7 x weekly - 3 sets - 10 reps  Sit to Stand with Counter Support - 1 x daily - 7 x weekly - 3 sets -  10 reps  Seated Scapular Retraction - 1 x daily - 7 x weekly - 3 sets - 10 reps    Consulted and Agree with Plan of Care Patient;Family member/caregiver    Family Member Consulted wife           Patient will benefit from skilled therapeutic intervention in order to improve the following deficits and impairments:  Abnormal gait,Decreased coordination,Difficulty walking,Decreased safety awareness,Decreased balance,Decreased strength,Decreased mobility  Visit Diagnosis: Abnormal posture - Plan: PT plan of care cert/re-cert  Ataxic gait - Plan: PT plan of care cert/re-cert  Other abnormalities of gait and mobility - Plan: PT plan of care cert/re-cert  Unsteadiness on feet - Plan: PT plan of care cert/re-cert  History of falling - Plan: PT plan of care cert/re-cert  Muscle weakness (generalized) - Plan: PT plan of care cert/re-cert  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 ([place]) - Plan: PT plan of care cert/re-cert     Problem List Patient Active Problem List   Diagnosis Date Noted  . Left hemiparesis ([place]) 12/06/2020  . Benign essential HTN   . Cognitive deficit, post-stroke   . Controlled type 2 diabetes mellitus with hyperglycemia, without long-term current use of insulin ([place])   . 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 Dyslipidemia, goal LDL below 70 03/12/2019  . Dysphagia due to recent stroke 03/12/2019  . 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 Abnormality of gait   . Gait disturbance, post-stroke   . 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    Hall Busing, PT, DPT 02/08/2021, 5:52 PM  [place]. [place], [place], [address] Phone: [phone]   Fax:  [phone]  Name: Joe BARGA Sr. MRN: [id number] Date of Birth: 07/18/1951

## 2021-02-09 ENCOUNTER — Telehealth: Payer: Self-pay | Admitting: Critical Care Medicine

## 2021-02-09 DIAGNOSIS — I6300 Cerebral infarction due to thrombosis of unspecified precerebral artery: Secondary | ICD-10-CM

## 2021-02-09 DIAGNOSIS — G8194 Hemiplegia, unspecified affecting left nondominant side: Secondary | ICD-10-CM

## 2021-02-09 DIAGNOSIS — I69398 Other sequelae of cerebral infarction: Secondary | ICD-10-CM

## 2021-02-09 NOTE — Telephone Encounter (Signed)
-----   Message from Verdene Lennert, [place] sent at 02/08/2021  2:51 PM EDT ----- Regarding: Referral Request Contact: [phone] Dr. Joya Gaskins -  Now that Christion is progressing cognitively, I was hoping you would be able to provide a referral for Occupational Therapy to assist with increasing independence with his ADLs. Pt had made a lot of improvement and I think this will help with some awareness.  Let me know what you think!  Thank you, Royston Sinner, M.S. CCC-SLP, [name]

## 2021-02-09 NOTE — Telephone Encounter (Signed)
Referral to OT made

## 2021-02-10 ENCOUNTER — Ambulatory Visit: Payer: Medicare Other | Admitting: Occupational Therapy

## 2021-02-10 ENCOUNTER — Encounter: Payer: Self-pay | Admitting: Occupational Therapy

## 2021-02-10 ENCOUNTER — Other Ambulatory Visit: Payer: Self-pay

## 2021-02-10 ENCOUNTER — Ambulatory Visit: Payer: Medicare Other | Admitting: Speech Pathology

## 2021-02-10 DIAGNOSIS — M6281 Muscle weakness (generalized): Secondary | ICD-10-CM

## 2021-02-10 DIAGNOSIS — R2689 Other abnormalities of gait and mobility: Secondary | ICD-10-CM | POA: Diagnosis not present

## 2021-02-10 DIAGNOSIS — I69319 Unspecified symptoms and signs involving cognitive functions following cerebral infarction: Secondary | ICD-10-CM | POA: Diagnosis not present

## 2021-02-10 DIAGNOSIS — R4701 Aphasia: Secondary | ICD-10-CM

## 2021-02-10 DIAGNOSIS — R278 Other lack of coordination: Secondary | ICD-10-CM | POA: Diagnosis not present

## 2021-02-10 DIAGNOSIS — R41842 Visuospatial deficit: Secondary | ICD-10-CM

## 2021-02-10 DIAGNOSIS — I6329 Cerebral infarction due to unspecified occlusion or stenosis of other precerebral arteries: Secondary | ICD-10-CM | POA: Diagnosis not present

## 2021-02-10 DIAGNOSIS — R41841 Cognitive communication deficit: Secondary | ICD-10-CM

## 2021-02-10 DIAGNOSIS — Z9181 History of falling: Secondary | ICD-10-CM | POA: Diagnosis not present

## 2021-02-10 DIAGNOSIS — R2681 Unsteadiness on feet: Secondary | ICD-10-CM | POA: Diagnosis not present

## 2021-02-10 DIAGNOSIS — R260 Ataxic gait: Secondary | ICD-10-CM | POA: Diagnosis not present

## 2021-02-10 DIAGNOSIS — R41844 Frontal lobe and executive function deficit: Secondary | ICD-10-CM

## 2021-02-10 DIAGNOSIS — R293 Abnormal posture: Secondary | ICD-10-CM | POA: Diagnosis not present

## 2021-02-10 NOTE — Therapy (Signed)
[place]. [place], [place], [address] Phone: [phone]   Fax:  [phone]  Speech Language Pathology Treatment  Patient Details  Name: Joe MOISE Sr. MRN: [id number] Date of Birth: Oct 28, 1950 Referring Provider (SLP): Elsie Stain, MD   Encounter Date: 02/10/2021   End of Session - 02/10/21 1515     Visit Number 31    Number of Visits --   Asking for 16 visits x2/week for 8 weeks.   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 ([name])    Diabetes mellitus without complication ([place])    Dysphagia    post stroke   Hypertension    Stroke [facility])     Past Surgical History:  Procedure Laterality Date   IR ANGIO INTRA EXTRACRAN SEL COM CAROTID INNOMINATE UNI L MOD SED  03/10/2019   IR ANGIO VERTEBRAL SEL SUBCLAVIAN INNOMINATE BILAT MOD SED  03/10/2019   IR CT HEAD LTD  03/10/2019   IR PERCUTANEOUS ART THROMBECTOMY/INFUSION INTRACRANIAL INC DIAG ANGIO  03/10/2019   RADIOLOGY WITH ANESTHESIA N/A 03/10/2019   Procedure: IR WITH ANESTHESIA/CODE STROKE;  Surgeon: Radiologist, Medication, MD;  Location: [place];  Service: Radiology;  Laterality: N/A;    There were no vitals filed for this visit.   Subjective Assessment - 02/10/21 1449     Subjective Pt reported that he "used something to measure his hand" in his OT evaluation today.    Currently in Pain? No/denies                   ADULT SLP TREATMENT - 02/10/21  1450       General Information   Behavior/Cognition Alert;Cooperative;Pleasant mood;Distractible;Decreased sustained attention      Treatment Provided   Treatment provided Cognitive-Linquistic      Cognitive-Linquistic Treatment   Treatment focused on Aphasia;Cognition    Skilled Treatment Trained pt in spaced retreival of month. Trained attention skills by having pt copy alphabet. Pt was able to copy letters with modA when broken into chunks as opposed to DEP in a long line.      Assessment / Recommendations / Plan   Plan Continue with current plan of care      Progression Toward Goals   Progression toward goals Progressing toward goals                SLP Short Term Goals - 02/10/21 1525       SLP SHORT TERM GOAL #1   Title Pt will name 6/10 objects independenty over 3 sessions to increase expressive communication.    Time 1    Period Weeks    Status On-going   Pt is able to make a choice verbally, but may not correctly identify.     SLP SHORT TERM GOAL #2   Title Pt will follow through with swallowing saliva before speaking at home as  reported by caregiver.    Time 1    Period Weeks    Status On-going      SLP SHORT TERM GOAL #3   Title Pt will demonstrate appropriate alternating attention between two tasks with <1 verbal/visual cue.    Time 1    Period Weeks    Status On-going              SLP Long Term Goals - 02/10/21 1525       SLP LONG TERM GOAL #1   Title Client will utilize compensatory strategies to communicate wants and needs effectively to different conversational partners and participate socially in functional living environment.    Time 5    Period Weeks    Status On-going      SLP LONG TERM GOAL #2   Title Client will demonstrate understanding of verbal communication related to daily activities and utilize compensatory strategies to more effectively communicate in their functional living environment.    Time 5    Period Weeks    Status  On-going      SLP LONG TERM GOAL #3   Title Patient will develop functional attention skills to effectively attend to and communicate in tasks of daily living in their functional living environment.    Time 5    Period Weeks    Status On-going              Plan - 02/10/21 1515     Clinical Impression Statement SLP had pt copy alphabet to train attention. Pt required mod-to-max cueing to complete. He performed better when letters were separated into chunks. SLP provided pt with a calendar, as we he still believes it is May. Spaced retrieval <4 min.    Speech Therapy Frequency 2x / week    Duration 12 weeks    Treatment/Interventions Aspiration precaution training;Cueing hierarchy;Patient/family education;Functional tasks;Environmental controls;Cognitive reorganization;Multimodal communcation approach;Language facilitation;Compensatory techniques;Internal/external aids;SLP instruction and feedback;Compensatory strategies    Potential to Achieve Goals Fair    Potential Considerations Ability to learn/carryover information;Severity of impairments    Consulted and Agree with Plan of Care Patient;Family member/caregiver             Patient will benefit from skilled therapeutic intervention in order to improve the following deficits and impairments:   Aphasia  Cognitive communication deficit    Problem List Patient Active Problem List   Diagnosis Date Noted   Left hemiparesis ([place]) 12/06/2020   Benign essential HTN    Cognitive deficit, post-stroke    Controlled type 2 diabetes mellitus with hyperglycemia, without long-term current use of insulin ([name])    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 [place], [place], [address] Phone: [phone]   Fax:  [phone]   Name: Joe LUPINSKI Sr. MRN: [id number] Date of Birth: 25-Jan-1951

## 2021-02-11 NOTE — Therapy (Signed)
[place]. [place], [place], [address] Phone: [phone]   Fax:  [phone]  Occupational Therapy Evaluation  Patient Details  Name: Joe DING Sr. 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 ([place]) 06/03/2019   Cerebrovascular accident (CVA) due to occlusion of vertebral artery ([name])    Diabetes mellitus without complication ([place])    Dysphagia    post stroke   Hypertension    Stroke [facility])     Past Surgical History:  Procedure Laterality Date   IR ANGIO INTRA EXTRACRAN SEL COM CAROTID INNOMINATE UNI L MOD SED  03/10/2019   IR ANGIO VERTEBRAL SEL SUBCLAVIAN INNOMINATE BILAT MOD SED  03/10/2019   IR CT HEAD LTD  03/10/2019   IR PERCUTANEOUS ART THROMBECTOMY/INFUSION INTRACRANIAL INC DIAG ANGIO  03/10/2019   RADIOLOGY WITH ANESTHESIA N/A 03/10/2019   Procedure: IR WITH ANESTHESIA/CODE STROKE;  Surgeon: Radiologist, Medication, MD;  Location: [facility];  Service: Radiology;  Laterality: N/A;    There were no vitals filed for this visit.   Subjective Assessment - 02/10/2021 1400 Subjective  Pt arrived for OP OT evaluation w/ his wife Joe Perry) who provided the majority of the subjective information due to pt's cognitive-communication deficits. Pt has been receiving OP ST and PT at this location since late Feb/early March 2022. Pt's spouse reports L sided weakness, difficulty  w/ ambulation, and that pt requires assist w/ BADLs. Prior to pt's CVA, wife reports he was independent and teaching as a professor at [employer].  Patient is accompanied by: Family memberPatient is accompanied by:Marland Kitchen Family member. The comment is Wife Transport planner). Taken on 02/10/21 1400  Pertinent History Hx of CVA (2017, March 2020, July 2020); T2DM; HTN  Limitations Fall risk; L homonymous hemianopia (per chart review); dysphagia; cognitive-communication deficit  Patient Stated Goals Get stronger; improve walking; improve independence  Currently in Pain? No/denies     [facility] OT Assessment - 02/10/2021 1402 Assessment Medical Diagnosis: CVA - R PCA occlusion Referring Provider (OT): Asencion Noble, MD Onset Date/Surgical Date: 03/10/19 Hand Dominance: Right Prior Therapy: Yes; inpatient, HH, OP PT/ST  Precautions Precautions: Fall  Balance Screen Has the patient fallen in the past 6 months: Yes How many times?: 1 Home  Environment Type of Home: [place]: Multi-level (6 stairs up to bed/bath; laundry in basement) Bathroom Shower/Tub: Tub/Shower unit Adaptive equipment: Long-handled sponge Home Equipment: Environmental consultant - 2 wheels, Wheelchair - manual, Hand held shower head, Shower seat, Bedside commode (Grab bars) Lives With: Spouse Transport planner)  Prior Function Level of Independence: Independent 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 Leisure: watch football ADL Eating/Feeding: Supervision/safety Grooming: Modified independent Upper Body Bathing: Minimal assistance (Wash his back) Lower Body Bathing: Minimal assistance (Occassionally Min A) Upper Body Dressing: Minimal assistance Lower Body Dressing: Minimal assistance Toileting - Clothing Manipulation: Minimal assistance Toileting -  Hygiene: Minimal assistance Tub/Shower  Transfer: Environmental manager: Radio broadcast assistant IADL Community Mobility: Relies on family or friends for  transportation Medication Management: Is not capable of dispensing or managing own medication  Mobility Mobility Status: Needs assist, History of falls Mobility Status Comments: Ambulation w/ RW w/ SPV; bradykinesia Written Expression Dominant Hand: Right Handwriting: Increased time (Able to write name w/ extended time) Vision Assessment Eye Alignment: Within Functional Limits Alignment/Gaze Preference: Chin down Tracking/Visual Pursuits: Requires cues, head turns, or add eye shifts to track Saccades: Decreased speed of saccadic movement Visual Fields: Left homonymous Hemianopsia (per chart review)  Cognition Overall Cognitive Status: Impaired/Different from baseline Area of Impairment: Problem solving, Memory, Following commands, Awareness, Safety/judgement Following Commands: Follows one step commands inconsistently Safety/Judgement: Decreased awareness of deficits Problem Solving: Slow processing, Decreased initiation, Difficulty sequencing, Requires verbal cues, Requires tactile cues Executive Function: Self Correcting, Initiating, Sequencing Observation/Other Assessments Observations: L hemichorea; bradykinesia Sensation Additional Comments: Difficult to assess due to cognitive and communication deficits  Coordination Gross Motor Movements are Fluid and Coordinated: No Fine Motor Movements are Fluid and Coordinated: No Coordination and Movement Description: Decreased speed of GM and FM movement Finger Nose Finger Test: Impaired 9 Hole Peg Test: Right, Left Right 9 Hole Peg Test: 1 min, 14 sec Left 9 Hole Peg Test: >3 min Box and Blocks: R (9); L (4) Perception Perception: Impaired Comments: Decreased attention to L side ROM / Strength AROM / PROM / Strength: Strength, AROM  AROM Overall AROM : Within functional limits for tasks performed (BUEs) Strength Overall Strength: Deficits Strength Assessment Site: Shoulder, Elbow Right/Left Shoulder: Right, Left Right  Shoulder Flexion: 4-/5 Right Shoulder Extension: 4-/5 Left Shoulder Flexion: 3+/5 Left Shoulder Extension: 3+/5 Right/Left Elbow: Right, Left Right Elbow Flexion: 4/5 Right Elbow Extension: 4-/5 Left Elbow Flexion: 4/5 Left Elbow Extension: 4-/5  Hand Function Right Hand Grip (lbs): 37# Left Hand Grip (lbs): 24#      OT Short Term Goals - 02/10/21 1445       OT SHORT TERM GOAL #1   Title Pt will be able to sequence through one BADL task (e.g., donning pants, washing UB) w/ SPV in at least 2 trials    Baseline Min A w/ BADL tasks    Time 6    Period Weeks    Status New    Target Date 03/25/21      OT SHORT TERM GOAL #2   Title Pt will improve participation in functional FM tasks as evidenced by completing 9-HPT w/ L hand in less than 3 minutes    Baseline 9-HPT w/ R hand (1 min, 14 sec); L hand (>3 min)    Time 6    Period Weeks    Status New      OT SHORT TERM GOAL #3   Title Pt will perform simple visual scanning at table with at least 90% accuracy.    Baseline Visual-perceptual deficits    Time 6    Period Weeks    Status New      OT SHORT TERM GOAL #4   Title Pt will improve GM coordination during BADLs as evidenced by increasing Box and Blocks test by at least 4 blocks    Baseline Box and Blocks (30 seconds) w/ R hand (9); L hand (4)    Time 6    Period Weeks    Status New               OT Long Term Goals - 02/10/21 1445  OT LONG TERM GOAL #1   Title Pt will be able to participate in HEP for strength, coordination, and visual-perception w/ SPV    Baseline No HEP at this time    Time 12    Period Weeks    Status New    Target Date 05/06/21      OT LONG TERM GOAL #2   Title Pt will improve participation in functional FM tasks as evidenced by decreasing 9-HPT time w/ R, dominant hand by at least 14 seconds    Baseline 9-HPT w/ R hand (1 min, 14 sec); L hand (>3 min)    Time 12    Period Weeks    Status New      OT LONG TERM GOAL #3    Title Pt will be able to complete simple IADL task (e.g., washing plastic dish, folding towel) w/ SPV for safety in at least 2 trials    Baseline Very minimal participation in IADLs at this time    Time 12    Period Weeks    Status New      OT LONG TERM GOAL #4   Title Pt will demonstrate full BADL activity (dressing, bathing, toileting) w/ SPV for safety    Baseline Completing BADLs w/ Min A, per spouse report    Time 12    Period Weeks    Status New                   Plan - 02/10/21 1429     Clinical Impression Statement Pt is a 70 y.o. male who presents to OP OT s/p CVA sustained in March and July of 2020 w/ residual L-sided weakness, cognitive-communication deficit, gait abnormality, and decreased coordination. Pt currently lives w/ his wife in a single-level home and requires assist w/ all ADLs at this time. PMHx includes hx of CVA (2017, 11/2018, 03/2019), T2DM, dyslipidemia, HTN, and vestibular schwannoma. Pt will benefit from skilled occupational therapy services to address strength and coordination, ROM, GM/FM control, cognition and safety awareness, visual-perception, and functional mobility to increase participation and safety during ADLs for improved quality of life and decreased caregiver strain.    OT Occupational Profile and History Detailed Assessment- Review of Records and additional review of physical, cognitive, psychosocial history related to current functional performance    Occupational performance deficits (Please refer to evaluation for details): ADL's;IADL's;Work;Leisure;Social Participation    Body Structure / Function / Physical Skills ADL;ROM;UE functional use;Balance;Decreased knowledge of use of DME;FMC;Mobility;Vestibular;Body mechanics;Dexterity;Gait;Sensation;Vision;GMC;Strength;Proprioception;IADL;Coordination    Cognitive Skills Attention;Problem Solve;Safety Awareness;Sequencing    Rehab Potential Good    Clinical Decision Making Several treatment  options, min-mod task modification necessary    Comorbidities Affecting Occupational Performance: May have comorbidities impacting occupational performance    Modification or Assistance to Complete Evaluation  Min-Moderate modification of tasks or assist with assess necessary to complete eval    OT Frequency 1x / week    OT Duration 12 weeks    OT Treatment/Interventions Self-care/ADL training;Therapeutic exercise;Visual/perceptual remediation/compensation;Aquatic Therapy;Moist Heat;Cryotherapy;Neuromuscular education;Patient/family education;Fluidtherapy;Energy conservation;Therapist, nutritional;Therapeutic activities;Balance training;DME and/or AE instruction;Manual Therapy;Passive range of motion;Cognitive remediation/compensation    Plan Review goals w/ pt; BUE strength/FMC    Consulted and Agree with Plan of Care Patient;Family member/caregiver    Family Member Consulted Wife Joe Perry)             Patient will benefit from skilled therapeutic intervention in order to improve the following deficits and impairments:   Body Structure / Function / Physical  Skills: ADL, ROM, UE functional use, Balance, Decreased knowledge of use of DME, FMC, Mobility, Vestibular, Body mechanics, Dexterity, Gait, Sensation, Vision, GMC, Strength, Proprioception, IADL, Coordination Cognitive Skills: Attention, Problem Solve, Safety Awareness, Sequencing     Visit Diagnosis: Other lack of coordination  Frontal lobe and executive function deficit  Unspecified symptoms and signs involving cognitive functions following cerebral infarction  Visuospatial deficit  Muscle weakness (generalized)  Other abnormalities of gait and mobility    Problem List Patient Active Problem List   Diagnosis Date Noted   Left hemiparesis ([place]) 12/06/2020   Benign essential HTN    Cognitive deficit, post-stroke    Controlled type 2 diabetes mellitus with hyperglycemia, without long-term current use of insulin ([name])     Vestibular schwannoma ([place]) 06/03/2019   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 [place], [place], [address] Phone: [phone]   Fax:  [phone]  Name: Joe CONCHAS Sr. MRN: [id number] Date of Birth: [birth year]

## 2021-02-14 ENCOUNTER — Telehealth: Payer: Self-pay

## 2021-02-14 NOTE — Telephone Encounter (Signed)
-----   Message from Elsie Stain, MD sent at 02/14/2021  4:27 PM EDT ----- Regarding: Pls call pt wife. She keeps calling my [employer] Loss adjuster, chartered. This pt wife keep calling me on [place]. I am out on pal until wed. Hos lefs are swelling. Pls dorect her to take him to mobile unit tomorrow and give them a heads up. Thanks. Dr Joya Gaskins

## 2021-02-14 NOTE — Addendum Note (Signed)
Addended by: Kathrine Cords on: 02/14/2021 09:41 AM   Modules accepted: Orders

## 2021-02-14 NOTE — Telephone Encounter (Signed)
Lvm for wife to take patient to mobile unit tomorrow per Dr.Wright address and time has been left on VM as well.

## 2021-02-15 ENCOUNTER — Emergency Department (HOSPITAL_COMMUNITY): Payer: Medicare Other

## 2021-02-15 ENCOUNTER — Ambulatory Visit: Payer: Medicare Other | Admitting: Physical Therapy

## 2021-02-15 ENCOUNTER — Inpatient Hospital Stay (HOSPITAL_COMMUNITY)
Admission: EM | Admit: 2021-02-15 | Discharge: 2021-02-17 | DRG: 871 | Disposition: A | Discharge status: Home / Self Care | Payer: Medicare Other | Attending: Internal Medicine | Admitting: Internal Medicine

## 2021-02-15 ENCOUNTER — Ambulatory Visit: Payer: Medicare Other | Admitting: Speech Pathology

## 2021-02-15 ENCOUNTER — Ambulatory Visit: Payer: Medicare Other | Admitting: Physician Assistant

## 2021-02-15 ENCOUNTER — Other Ambulatory Visit: Payer: Self-pay

## 2021-02-15 VITALS — BP 140/76 | HR 95 | Temp 98.2°F | Resp 18 | Ht 75.0 in

## 2021-02-15 DIAGNOSIS — R509 Fever, unspecified: Secondary | ICD-10-CM

## 2021-02-15 DIAGNOSIS — Z79899 Other long term (current) drug therapy: Secondary | ICD-10-CM

## 2021-02-15 DIAGNOSIS — G9341 Metabolic encephalopathy: Secondary | ICD-10-CM | POA: Diagnosis present

## 2021-02-15 DIAGNOSIS — I69354 Hemiplegia and hemiparesis following cerebral infarction affecting left non-dominant side: Secondary | ICD-10-CM | POA: Diagnosis not present

## 2021-02-15 DIAGNOSIS — R000 Tachycardia, unspecified: Secondary | ICD-10-CM | POA: Diagnosis not present

## 2021-02-15 DIAGNOSIS — I69391 Dysphagia following cerebral infarction: Secondary | ICD-10-CM | POA: Diagnosis not present

## 2021-02-15 DIAGNOSIS — R531 Weakness: Secondary | ICD-10-CM | POA: Diagnosis not present

## 2021-02-15 DIAGNOSIS — Z8673 Personal history of transient ischemic attack (TIA), and cerebral infarction without residual deficits: Secondary | ICD-10-CM

## 2021-02-15 DIAGNOSIS — Z20822 Contact with and (suspected) exposure to covid-19: Secondary | ICD-10-CM | POA: Diagnosis not present

## 2021-02-15 DIAGNOSIS — G8194 Hemiplegia, unspecified affecting left nondominant side: Secondary | ICD-10-CM | POA: Diagnosis not present

## 2021-02-15 DIAGNOSIS — Z7982 Long term (current) use of aspirin: Secondary | ICD-10-CM

## 2021-02-15 DIAGNOSIS — I6300 Cerebral infarction due to thrombosis of unspecified precerebral artery: Secondary | ICD-10-CM

## 2021-02-15 DIAGNOSIS — R5383 Other fatigue: Secondary | ICD-10-CM

## 2021-02-15 DIAGNOSIS — M25471 Effusion, right ankle: Secondary | ICD-10-CM | POA: Diagnosis not present

## 2021-02-15 DIAGNOSIS — M25472 Effusion, left ankle: Secondary | ICD-10-CM | POA: Diagnosis not present

## 2021-02-15 DIAGNOSIS — Z833 Family history of diabetes mellitus: Secondary | ICD-10-CM | POA: Diagnosis not present

## 2021-02-15 DIAGNOSIS — E1165 Type 2 diabetes mellitus with hyperglycemia: Secondary | ICD-10-CM | POA: Diagnosis present

## 2021-02-15 DIAGNOSIS — I69319 Unspecified symptoms and signs involving cognitive functions following cerebral infarction: Secondary | ICD-10-CM | POA: Diagnosis not present

## 2021-02-15 DIAGNOSIS — R058 Other specified cough: Secondary | ICD-10-CM

## 2021-02-15 DIAGNOSIS — Z7984 Long term (current) use of oral hypoglycemic drugs: Secondary | ICD-10-CM

## 2021-02-15 DIAGNOSIS — I313 Pericardial effusion (noninflammatory): Secondary | ICD-10-CM | POA: Diagnosis not present

## 2021-02-15 DIAGNOSIS — F0390 Unspecified dementia without behavioral disturbance: Secondary | ICD-10-CM | POA: Diagnosis present

## 2021-02-15 DIAGNOSIS — F79 Unspecified intellectual disabilities: Secondary | ICD-10-CM

## 2021-02-15 DIAGNOSIS — J984 Other disorders of lung: Secondary | ICD-10-CM | POA: Diagnosis not present

## 2021-02-15 DIAGNOSIS — Z743 Need for continuous supervision: Secondary | ICD-10-CM | POA: Diagnosis not present

## 2021-02-15 DIAGNOSIS — J302 Other seasonal allergic rhinitis: Secondary | ICD-10-CM

## 2021-02-15 DIAGNOSIS — I69320 Aphasia following cerebral infarction: Secondary | ICD-10-CM | POA: Diagnosis not present

## 2021-02-15 DIAGNOSIS — I10 Essential (primary) hypertension: Secondary | ICD-10-CM | POA: Diagnosis present

## 2021-02-15 DIAGNOSIS — A419 Sepsis, unspecified organism: Secondary | ICD-10-CM | POA: Diagnosis not present

## 2021-02-15 DIAGNOSIS — G459 Transient cerebral ischemic attack, unspecified: Secondary | ICD-10-CM | POA: Diagnosis not present

## 2021-02-15 DIAGNOSIS — Z8249 Family history of ischemic heart disease and other diseases of the circulatory system: Secondary | ICD-10-CM

## 2021-02-15 DIAGNOSIS — J189 Pneumonia, unspecified organism: Secondary | ICD-10-CM | POA: Diagnosis present

## 2021-02-15 DIAGNOSIS — I2510 Atherosclerotic heart disease of native coronary artery without angina pectoris: Secondary | ICD-10-CM | POA: Diagnosis not present

## 2021-02-15 HISTORY — DX: Pneumonia, unspecified organism: J18.9

## 2021-02-15 HISTORY — DX: Metabolic encephalopathy: G93.41

## 2021-02-15 LAB — URINALYSIS, ROUTINE W REFLEX MICROSCOPIC
Bacteria, UA: NONE SEEN
Bilirubin Urine: NEGATIVE
Glucose, UA: NEGATIVE mg/dL
Ketones, ur: 5 mg/dL — AB
Leukocytes,Ua: NEGATIVE
Nitrite: NEGATIVE
Protein, ur: 30 mg/dL — AB
Specific Gravity, Urine: 1.019 (ref 1.005–1.030)
pH: 6 (ref 5.0–8.0)

## 2021-02-15 LAB — LACTIC ACID, PLASMA
Lactic Acid, Venous: 2.4 mmol/L (ref 0.5–1.9)
Lactic Acid, Venous: 1.4 mmol/L (ref 0.5–1.9)

## 2021-02-15 LAB — COMPREHENSIVE METABOLIC PANEL
ALT: 25 U/L (ref 0–44)
AST: 31 U/L (ref 15–41)
Albumin: 3.4 g/dL — ABNORMAL LOW (ref 3.5–5.0)
Alkaline Phosphatase: 49 U/L (ref 38–126)
Anion gap: 10 (ref 5–15)
BUN: 13 mg/dL (ref 8–23)
CO2: 25 mmol/L (ref 22–32)
Calcium: 8.7 mg/dL — ABNORMAL LOW (ref 8.9–10.3)
Chloride: 102 mmol/L (ref 98–111)
Creatinine, Ser: 0.83 mg/dL (ref 0.61–1.24)
GFR, Estimated: >60 mL/min (ref >60)
Glucose, Bld: 109 mg/dL — ABNORMAL HIGH (ref 70–99)
Potassium: 3.7 mmol/L (ref 3.5–5.1)
Sodium: 137 mmol/L (ref 135–145)
Total Bilirubin: 1.3 mg/dL — ABNORMAL HIGH (ref 0.3–1.2)
Total Protein: 6.4 g/dL — ABNORMAL LOW (ref 6.5–8.1)

## 2021-02-15 LAB — CBC WITH DIFFERENTIAL/PLATELET
Abs Immature Granulocytes: 0.02 10*3/uL (ref 0.00–0.07)
Basophils Absolute: 0 10*3/uL (ref 0.0–0.1)
Basophils Relative: 1 %
Eosinophils Absolute: 0 10*3/uL (ref 0.0–0.5)
Eosinophils Relative: 0 %
HCT: 42 % (ref 39.0–52.0)
Hemoglobin: 13.5 g/dL (ref 13.0–17.0)
Immature Granulocytes: 0 %
Lymphocytes Relative: 13 %
Lymphs Abs: 1.1 10*3/uL (ref 0.7–4.0)
MCH: 30.7 pg (ref 26.0–34.0)
MCHC: 32.1 g/dL (ref 30.0–36.0)
MCV: 95.5 fL (ref 80.0–100.0)
Monocytes Absolute: 0.5 10*3/uL (ref 0.1–1.0)
Monocytes Relative: 6 %
Neutro Abs: 7 10*3/uL (ref 1.7–7.7)
Neutrophils Relative %: 80 %
Platelets: 197 10*3/uL (ref 150–400)
RBC: 4.4 MIL/uL (ref 4.22–5.81)
RDW: 12.2 % (ref 11.5–15.5)
WBC: 8.6 10*3/uL (ref 4.0–10.5)
nRBC: 0 % (ref 0.0–0.2)

## 2021-02-15 LAB — RESP PANEL BY RT-PCR (FLU A&B, COVID) ARPGX2
Influenza A by PCR: NEGATIVE
Influenza B by PCR: NEGATIVE
SARS Coronavirus 2 by RT PCR: NEGATIVE

## 2021-02-15 LAB — APTT: aPTT: 26 seconds (ref 24–36)

## 2021-02-15 LAB — BRAIN NATRIURETIC PEPTIDE: B Natriuretic Peptide: 173.5 pg/mL — ABNORMAL HIGH (ref 0.0–100.0)

## 2021-02-15 LAB — CBG MONITORING, ED: Glucose-Capillary: 103 mg/dL — ABNORMAL HIGH (ref 70–99)

## 2021-02-15 LAB — GLUCOSE, POCT (MANUAL RESULT ENTRY): POC Glucose: 213 mg/dl — AB (ref 70–99)

## 2021-02-15 LAB — PROTIME-INR
INR: 1.1 (ref 0.8–1.2)
Prothrombin Time: 14.5 seconds (ref 11.4–15.2)

## 2021-02-15 LAB — POC COVID19 BINAXNOW: SARS Coronavirus 2 Ag: NEGATIVE

## 2021-02-15 MED ORDER — ENOXAPARIN SODIUM 40 MG/0.4ML IJ SOSY
40.0000 mg | PREFILLED_SYRINGE | INTRAMUSCULAR | Status: DC
  Administered 2021-02-15 – 2021-02-16 (×2): 40 mg via SUBCUTANEOUS
  Filled 2021-02-15 (×2): qty 0.4

## 2021-02-15 MED ORDER — SODIUM CHLORIDE 0.9 % IV BOLUS
250.0000 mL | Freq: Once | INTRAVENOUS | Status: AC
  Administered 2021-02-15: 250 mL via INTRAVENOUS

## 2021-02-15 MED ORDER — LACTATED RINGERS IV SOLN: INTRAVENOUS | Status: DC

## 2021-02-15 MED ORDER — ACETAMINOPHEN 650 MG RE SUPP: 650.0000 mg | Freq: Four times a day (QID) | RECTAL | Status: DC | PRN

## 2021-02-15 MED ORDER — ACETAMINOPHEN 500 MG PO TABS
1000.0000 mg | ORAL_TABLET | Freq: Once | ORAL | Status: AC
  Administered 2021-02-15: 1000 mg via ORAL
  Filled 2021-02-15: qty 2

## 2021-02-15 MED ORDER — SODIUM CHLORIDE 0.9 % IV SOLN: Freq: Once | INTRAVENOUS | Status: DC

## 2021-02-15 MED ORDER — METRONIDAZOLE 500 MG/100ML IV SOLN
500.0000 mg | Freq: Once | INTRAVENOUS | Status: AC
  Administered 2021-02-15: 500 mg via INTRAVENOUS
  Filled 2021-02-15: qty 100

## 2021-02-15 MED ORDER — VANCOMYCIN HCL 1750 MG/350ML IV SOLN
1750.0000 mg | Freq: Once | INTRAVENOUS | Status: AC
  Administered 2021-02-15: 1750 mg via INTRAVENOUS
  Filled 2021-02-15: qty 350

## 2021-02-15 MED ORDER — ACETAMINOPHEN 500 MG PO TABS
1000.0000 mg | ORAL_TABLET | Freq: Four times a day (QID) | ORAL | Status: DC | PRN
  Administered 2021-02-16: 1000 mg via ORAL
  Filled 2021-02-15: qty 2

## 2021-02-15 MED ORDER — HALOPERIDOL LACTATE 5 MG/ML IJ SOLN
2.0000 mg | Freq: Four times a day (QID) | INTRAMUSCULAR | Status: DC | PRN
  Administered 2021-02-15: 2 mg via INTRAVENOUS
  Filled 2021-02-15: qty 1

## 2021-02-15 MED ORDER — INSULIN ASPART 100 UNIT/ML IJ SOLN
0.0000 [IU] | INTRAMUSCULAR | Status: DC
  Administered 2021-02-16: 1 [IU] via SUBCUTANEOUS

## 2021-02-15 MED ORDER — SODIUM CHLORIDE 0.9 % IV SOLN
2.0000 g | INTRAVENOUS | Status: DC
  Administered 2021-02-16 – 2021-02-17 (×2): 2 g via INTRAVENOUS
  Filled 2021-02-15 (×2): qty 20

## 2021-02-15 MED ORDER — HALOPERIDOL LACTATE 5 MG/ML IJ SOLN
2.0000 mg | Freq: Four times a day (QID) | INTRAMUSCULAR | Status: DC | PRN
  Administered 2021-02-15: 2 mg via INTRAVENOUS
  Administered 2021-02-16: 4 mg via INTRAVENOUS
  Filled 2021-02-15 (×2): qty 1

## 2021-02-15 MED ORDER — VANCOMYCIN HCL 1250 MG/250ML IV SOLN
1250.0000 mg | Freq: Two times a day (BID) | INTRAVENOUS | Status: DC
  Filled 2021-02-15: qty 250

## 2021-02-15 MED ORDER — SODIUM CHLORIDE 0.9 % IV SOLN: 2.0000 g | Freq: Three times a day (TID) | INTRAVENOUS | Status: DC

## 2021-02-15 MED ORDER — SODIUM CHLORIDE 0.9 % IV SOLN
500.0000 mg | INTRAVENOUS | Status: DC
  Administered 2021-02-15: 500 mg via INTRAVENOUS
  Filled 2021-02-15: qty 500

## 2021-02-15 MED ORDER — ACETAMINOPHEN 650 MG RE SUPP
650.0000 mg | Freq: Four times a day (QID) | RECTAL | Status: DC | PRN
  Administered 2021-02-15: 650 mg via RECTAL
  Filled 2021-02-15: qty 1

## 2021-02-15 MED ORDER — SODIUM CHLORIDE 0.9 % IV SOLN
2.0000 g | Freq: Once | INTRAVENOUS | Status: AC
  Administered 2021-02-15: 2 g via INTRAVENOUS
  Filled 2021-02-15: qty 2

## 2021-02-15 MED ORDER — CETIRIZINE HCL 10 MG PO TABS: 10.0000 mg | ORAL_TABLET | Freq: Every day | ORAL | 11 refills | Status: DC

## 2021-02-15 NOTE — Patient Instructions (Signed)
We are evaluating the ankle and foot swelling, cough, fever, fatigue and weakness with lab tests and an x-ray.  We will call you with the results as soon as they are available.  I do encourage you to start taking Zyrtec on a daily basis to help with the congestion.  I do encourage you to keep your feet elevated while sitting, follow a low-sodium diet.  Kennieth Rad, PA-C Physician Assistant [facility] Medicine [email or web address]   Edema  Edema is an abnormal buildup of fluids in the body tissues and under the skin. Swelling of the legs, feet, and ankles is a common symptom that becomes more likely as you get older. Swelling is also common in looser tissues, like around the eyes. When the affected area is squeezed, the fluid may move out of thatspot and leave a dent for a few moments. This dent is called pitting edema. There are many possible causes of edema. Eating too much salt (sodium) and being on your feet or sitting for a long time can cause edema in your legs, feet, and ankles. Hot weather may make edema worse. Common causes of edema include: Heart failure. Liver or kidney disease. Weak leg blood vessels. Cancer. An injury. Pregnancy. Medicines. Being obese. Low protein levels in the blood. Edema is usually painless. Your skin may look swollen or shiny. Follow these instructions at home: Keep the affected body part raised (elevated) above the level of your heart when you are sitting or lying down. Do not sit still or stand for long periods of time. Do not wear tight clothing. Do not wear garters on your upper legs. Exercise your legs to get your circulation going. This helps to move the fluid back into your blood vessels, and it may help the swelling go down. Wear elastic bandages or support stockings to reduce swelling as told by your health care provider. Eat a low-salt (low-sodium) diet to reduce fluid as told by your health  care provider. Depending on the cause of your swelling, you may need to limit how much fluid you drink (fluid restriction). Take over-the-counter and prescription medicines only as told by your health care provider. Contact a health care provider if: Your edema does not get better with treatment. You have heart, liver, or kidney disease and have symptoms of edema. You have sudden and unexplained weight gain. Get help right away if: You develop shortness of breath or chest pain. You cannot breathe when you lie down. You develop pain, redness, or warmth in the swollen areas. You have heart, liver, or kidney disease and suddenly get edema. You have a fever and your symptoms suddenly get worse. Summary Edema is an abnormal buildup of fluids in the body tissues and under the skin. Eating too much salt (sodium) and being on your feet or sitting for a long time can cause edema in your legs, feet, and ankles. Keep the affected body part raised (elevated) above the level of your heart when you are sitting or lying down. This information is not intended to replace advice given to you by your health care provider. Make sure you discuss any questions you have with your healthcare provider. Document Revised: 06/29/2020 Document Reviewed: 06/15/2020 Elsevier Patient Education  2022 [employer].

## 2021-02-15 NOTE — Sepsis Progress Note (Signed)
Notified bedside nurse of need to administer antibiotics.  

## 2021-02-15 NOTE — Sepsis Progress Note (Signed)
Notified bedside nurse of need to draw repeat lactic acid. 

## 2021-02-15 NOTE — H&P (Addendum)
History and Physical    Joe B Hoots Sr. FMB:[id number] DOB: October 05, 1950 DOA: 02/15/2021  PCP: Elsie Stain, MD  Patient coming from: Home  I have personally briefly reviewed patient's old medical records in [place]  Chief Complaint: Weakness  HPI: Joe CRAMPTON Sr. is a 70 y.o. male with medical history significant of prior strokes, DM2, HTN.  Dysphagia, cognitive deficits, and aphasia, and hemiparesis post stroke.  Pt poor historian secondary to aphasia / delirium currently.  Apparently his aphasia gets worse when ever he runs an infection or gets sick per family.  Per family they called EMS due to worsening L sided weakness.  Onset 4 days ago.  Yesterday developed fever.  Gave tylenol.  EMS called, pt evaluated but not brought to ED yesterday.  Pt has chronic cough, nothing new or different, no other infectious source symptoms.  No N/V, abd pain, abnormal BMs.   ED Course: Tm 101.5, HR 104.  WBC nl.  Pt given cefepime, flagyl, vanc.  COVID neg  CT chest reveals: 1) early LUL PNA 2) suspicion for aspiration in LLL  Pt mental and verbal status waxes and wanes with fever while in ED.   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 ([name])    Diabetes mellitus without complication ([place])    Dysphagia    post stroke   Hypertension    Stroke [facility])     Past Surgical History:  Procedure Laterality Date   IR ANGIO INTRA EXTRACRAN SEL COM CAROTID INNOMINATE UNI L MOD SED  03/10/2019   IR ANGIO VERTEBRAL SEL SUBCLAVIAN INNOMINATE BILAT MOD SED  03/10/2019   IR CT HEAD LTD  03/10/2019   IR PERCUTANEOUS ART THROMBECTOMY/INFUSION INTRACRANIAL INC DIAG ANGIO  03/10/2019   RADIOLOGY WITH ANESTHESIA N/A 03/10/2019   Procedure: IR WITH ANESTHESIA/CODE  STROKE;  Surgeon: Radiologist, Medication, MD;  Location: [place];  Service: Radiology;  Laterality: N/A;     reports that he has never smoked. He has never used smokeless tobacco. He reports that he does not drink alcohol and does not use drugs.  No Known Allergies  Family History  Problem Relation Age of Onset   Diabetes Other    Hypertension Other    Healthy Mother    Healthy Father      Prior to Admission medications   Medication Sig Start Date End Date Taking? Authorizing Provider  acetaminophen (TYLENOL) 325 MG tablet Take 2 tablets (650 mg total) by mouth every 4 (four) hours as needed for mild pain (or temp > 37.5 C (99.5 F)). 06/19/19   Angiulli, Lavon Paganini, PA-C  amLODipine (NORVASC) 5 MG tablet Take 1 tablet (5 mg total) by mouth daily. To lower blood pressure 10/04/20   Elsie Stain, MD  aspirin EC 81 MG tablet Take 1 tablet (81 mg total) by mouth daily. 07/26/20   Elsie Stain, MD  atorvastatin (LIPITOR) 40 MG tablet One daily in the evening to lower cholesterol 10/04/20   Elsie Stain, MD  cetirizine (ZYRTEC) 10 MG tablet Take 1 tablet (10 mg total) by mouth daily. 02/15/21   Mayers, Cari S, PA-C  donepezil (ARICEPT) 10 MG tablet TAKE 1 TABLET BY MOUTH AT BEDTIME 01/15/21   Elsie Stain, MD  metFORMIN (GLUCOPHAGE) 500 MG tablet  Take 1 tablet (500 mg total) by mouth 2 (two) times daily with a meal. 12/06/20 01/05/21  Elsie Stain, MD  QUEtiapine (SEROQUEL) 25 MG tablet Take 2 tablets (50 mg total) by mouth at bedtime. 10/04/20   Elsie Stain, MD    Physical Exam: Vitals:   02/15/21 1609 02/15/21 1732 02/15/21 1815 02/15/21 1930  BP: 135/80 128/85 (!) 141/66 (!) 164/93  Pulse: 100 (!) 101 90 79  Resp: 20 20 17  (!) 21  Temp:  99.7 F (37.6 C)    TempSrc:  Oral    SpO2: 96% 97% 96% 95%  Weight:      Height:        Constitutional: Ill appearing Eyes: PERRL, lids and conjunctivae normal ENMT: Mucous membranes are moist. Posterior pharynx clear of  any exudate or lesions.Normal dentition.  Neck: normal, supple, no masses, no thyromegaly Respiratory: Rhonchi bilaterally Cardiovascular: Tachycardic, regular rhythm Abdomen: no tenderness, no masses palpated. No hepatosplenomegaly. Bowel sounds positive.  Musculoskeletal: no clubbing / cyanosis. No joint deformity upper and lower extremities. Good ROM, no contractures. Normal muscle tone.  Skin: no rashes, lesions, ulcers. No induration Neurologic: Non-verbal currently.  MAE Psychiatric: Confused, pt seems delirious.  Non-verbal currently   Labs on Admission: I have personally reviewed following labs and imaging studies  CBC: Recent Labs  Lab 02/15/21 1442  WBC 8.6  NEUTROABS 7.0  HGB 13.5  HCT 42.0  MCV 95.5  PLT 833   Basic Metabolic Panel: Recent Labs  Lab 02/15/21 1632  NA 137  K 3.7  CL 102  CO2 25  GLUCOSE 109*  BUN 13  CREATININE 0.83  CALCIUM 8.7*   GFR: Estimated Creatinine Clearance: 94.8 mL/min (by C-G formula based on SCr of 0.83 mg/dL). Liver Function Tests: Recent Labs  Lab 02/15/21 1632  AST 31  ALT 25  ALKPHOS 49  BILITOT 1.3*  PROT 6.4*  ALBUMIN 3.4*   No results for input(s): LIPASE, AMYLASE in the last 168 hours. No results for input(s): AMMONIA in the last 168 hours. Coagulation Profile: Recent Labs  Lab 02/15/21 1700  INR 1.1   Cardiac Enzymes: No results for input(s): CKTOTAL, CKMB, CKMBINDEX, TROPONINI in the last 168 hours. BNP (last 3 results) No results for input(s): PROBNP in the last 8760 hours. HbA1C: No results for input(s): HGBA1C in the last 72 hours. CBG: No results for input(s): GLUCAP in the last 168 hours. Lipid Profile: No results for input(s): CHOL, HDL, LDLCALC, TRIG, CHOLHDL, LDLDIRECT in the last 72 hours. Thyroid Function Tests: No results for input(s): TSH, T4TOTAL, FREET4, T3FREE, THYROIDAB in the last 72 hours. Anemia Panel: No results for input(s): VITAMINB12, FOLATE, FERRITIN, TIBC, IRON,  RETICCTPCT in the last 72 hours. Urine analysis:    Component Value Date/Time   COLORURINE YELLOW 06/07/2020 1344   APPEARANCEUR TURBID (A) 06/07/2020 1344   LABSPEC 1.010 06/07/2020 1344   PHURINE 6.0 06/07/2020 1344   GLUCOSEU NEGATIVE 06/07/2020 1344   HGBUR MODERATE (A) 06/07/2020 1344   BILIRUBINUR NEGATIVE 06/07/2020 1344   KETONESUR NEGATIVE 06/07/2020 1344   PROTEINUR 100 (A) 06/07/2020 1344   UROBILINOGEN 1.0 02/12/2020 1416   NITRITE NEGATIVE 06/07/2020 1344   LEUKOCYTESUR LARGE (A) 06/07/2020 1344    Radiological Exams on Admission: CT Head Wo Contrast  Result Date: 02/15/2021 CLINICAL DATA:  Altered mental status EXAM: CT HEAD WITHOUT CONTRAST TECHNIQUE: Contiguous axial images were obtained from the base of the skull through the vertex without intravenous contrast. COMPARISON:  April 11, 2019 head CT; brain MRI July 02, 2019 FINDINGS: Brain: Mild to moderate diffuse atrophy is stable. There is no appreciable intracranial mass, hemorrhage, extra-axial fluid collection, or midline shift. There is evidence of a prior infarct involving the superomedial aspect of the right occipital lobe, also present on prior studies. There are prior infarcts involving portions of the head of the caudate nucleus on the right as well as the adjacent anterior limb of the right internal capsule. There is evidence of a prior infarct in the left anterior lentiform nucleus with involvement of a portion of the anterior limb of the left internal capsule. There is evidence of a prior infarct involving the mid anterior right frontal lobe. There is small vessel disease in the centra semiovale bilaterally. There is evidence of a prior infarct in the lateral mid to upper left cerebellar hemisphere. No acute appearing infarct evident. Vascular: No hyperdense vessel. There is calcification in each carotid siphon region. Skull: The bony calvarium appears intact. Sinuses/Orbits: There is mucosal thickening in several  ethmoid air cells. Visualized orbits appear symmetric bilaterally. Other: Mastoid air cells are clear. IMPRESSION: Atrophy with multiple prior infarcts, largest in the right occipital lobe. No acute infarct. No mass or hemorrhage. There are foci of arterial vascular calcification. There is mucosal thickening in several ethmoid air cells. Electronically Signed   By: Lowella Grip III M.D.   On: 02/15/2021 17:09   CT Chest Wo Contrast  Result Date: 02/15/2021 CLINICAL DATA:  70 year old male with pneumonia. EXAM: CT CHEST WITHOUT CONTRAST TECHNIQUE: Multidetector CT imaging of the chest was performed following the standard protocol without IV contrast. COMPARISON:  Chest radiograph dated 02/15/2021. FINDINGS: Evaluation of this exam is limited in the absence of intravenous contrast. Cardiovascular: Top-normal cardiac size. Trace pericardial effusion. There is mild atherosclerotic calcification of the aortic arch. The central pulmonary arteries are grossly unremarkable on this noncontrast CT. Mediastinum/Nodes: No hilar or mediastinal adenopathy. Evaluation however is limited in the absence of intravenous contrast and secondary to respiratory motion. The esophagus and the thyroid gland are grossly unremarkable. No mediastinal fluid collection. Lungs/Pleura: Faint ground-glass density in the left upper lobe may represent atelectasis but concerning for developing infiltrate. Clinical correlation recommended. No lobar consolidation, pleural effusion, or pneumothorax. The central airways are patent. Mucus secretions noted in the left lower lobe bronchus. Aspiration is not excluded. Upper Abdomen: No acute abnormality. Musculoskeletal: Degenerative changes of the spine. No acute osseous pathology. IMPRESSION: 1. Faint ground-glass density in the left upper lobe concerning for developing infiltrate. Clinical correlation recommended. 2. Mucus secretions in the left lower lobe bronchus. Aspiration is not excluded. 3.  Aortic Atherosclerosis (ICD10-I70.0). Electronically Signed   By: Anner Crete M.D.   On: 02/15/2021 17:13   DG Chest Port 1 View  Result Date: 02/15/2021 CLINICAL DATA:  Left-sided weakness. EXAM: PORTABLE CHEST 1 VIEW COMPARISON:  PA and lateral chest 10/09/2020. FINDINGS: The patient is rotated on the exam. Lungs are clear. Heart size is normal. Aortic atherosclerosis. No pneumothorax or pleural fluid. No acute or focal bony abnormality. IMPRESSION: No acute disease. Aortic Atherosclerosis (ICD10-I70.0). Electronically Signed   By: Inge Rise M.D.   On: 02/15/2021 15:24    EKG: Independently reviewed.  Assessment/Plan Principal Problem:   Left upper lobe pneumonia Active Problems:   Dysphagia due to recent stroke   Controlled type 2 diabetes mellitus with hyperglycemia, without long-term current use of insulin ([name])   Cognitive deficit, post-stroke   Benign essential HTN   Left hemiparesis (  [place])   Acute metabolic encephalopathy    LUL PNA - concern for aspriation PNA pathway NPO SLP eval in AM Empiric rocephin + azithro (got cefepime, flagyl, vanc in ED) Tele monitor IVF: LR at 100 Repeat labs in AM H/o Dysphagia - NPO until he can get SLP eval in AM Aspiration precautions Acute metabolic encephalopathy - Delirium secondary to #1 above Aphasia seems to wax and wane with his fever Sounds like this is typical when he becomes ill per history from family. Haldol PRN agitation if needed HTN - Holding home meds Use PRN IV if needed DM2 - Sensitive SSI Q4H Chronic L hemiparesis, cognitive deficits - form prior stroke Symptoms worsened with acute illness per family, this has happened to pt in past as well it seems.  DVT prophylaxis: Lovenox Code Status: Full Family Communication: No family in room Disposition Plan: Home after treatment for PNA, able to take POs safely again Consults called: None Admission status: Admit to inpatient  Severity of Illness: The  appropriate patient status for this patient is INPATIENT. Inpatient status is judged to be reasonable and necessary in order to provide the required intensity of service to ensure the patient's safety. The patient's presenting symptoms, physical exam findings, and initial radiographic and laboratory data in the context of their chronic comorbidities is felt to place them at high risk for further clinical deterioration. Furthermore, it is not anticipated that the patient will be medically stable for discharge from the hospital within 2 midnights of admission. The following factors support the patient status of inpatient.   IP status due to: 1) acute metabolic encephalopathy secondary to PNA 2) inability to safely take POs at the moment   * I certify that at the point of admission it is my clinical judgment that the patient will require inpatient hospital care spanning beyond 2 midnights from the point of admission due to high intensity of service, high risk for further deterioration and high frequency of surveillance required.*   Teal Raben M. DO [facility]  How to contact the [facility] Attending or Consulting provider [place] or covering provider during after hours [place], for this patient?  Check the care team in [facility] and look for a) attending/consulting [facility] provider listed and b) the [facility] team listed Log into [email or web address]  Amion Physician Scheduling and messaging for groups and whole hospitals  On call and physician scheduling software for group practices, residents, hospitalists and other medical providers for call, clinic, rotation and shift schedules. On[employer] is a hospital-wide system for scheduling doctors and paging doctors on call. EasyPlot is for scientific plotting and data analysis.  [email or web address]  and use [place]'s universal password to access. If you do not have the password, please contact the hospital operator.  Locate the [facility] provider you are looking for under  [facility] and page to a number that you can be directly reached. If you still have difficulty reaching the provider, please page the [facility] (Director on Call) for the Hospitalists listed on amion for assistance.  02/15/2021, 8:24 PM

## 2021-02-15 NOTE — ED Notes (Signed)
Unable to get second set of cultures due to starting antibiotics.

## 2021-02-15 NOTE — Progress Notes (Signed)
Patient reports a runny nose with clear mucous since his stroke. Patient has had a fever since yesterday at 100.7 with minimal breakage after using tylenol with a post 3 hr check being 100.3. Patient this morning was

## 2021-02-15 NOTE — ED Notes (Signed)
Bladder scan shows 136 mL.

## 2021-02-15 NOTE — ED Triage Notes (Signed)
Patient presents to the ED after family seen him dragging his left foot more than normal with more left sided weakness.  EMS states that the patient already has left sided deficits from previous CVA.  Last seen normal around noon yesterday.    BP: 122/88 Pulse:78 CBG: 143 Temp: 100.4

## 2021-02-15 NOTE — Sepsis Progress Note (Signed)
ELINK tracking the Code Sepsis.

## 2021-02-15 NOTE — Progress Notes (Signed)
Established Patient Office Visit  Subjective:  Patient ID: Joe Brostrom., male    DOB: 1951-03-13  Age: 70 y.o. MRN: [id number]  CC: No chief complaint on file.   HPI Joe NORDBY Sr. presents with wife who provides a history.  States that he has been having a runny nose and cough with elevated temperature.  Reports cough worse in the last couple of days, elevated temperature in the last couple of days.  Reports readings of 100.7 and 100.3, has been using Tylenol with some relief.  Reports this morning reading of 99.6.  States that runny nose has been present for significant amount of time, "since his stroke".  Reports clear discharge, and clear sputum with cough.  Does endorse that patient has difficulty expelling sputum.  Reports that he started having more difficulty using his walker starting yesterday.  Reports that he has been sleeping more during the day.  Does endorse that he did take his dose of Seroquel last night, but was very restless and did not sleep well.  No sick contacts.  Reports that he has been eating and drinking fine, states that he ate a good breakfast this morning.  Reports that he has been having some swelling in his ankle and feet, states this has been present for the last couple days as well.  Denies any improvement after laying down, but does endorse that he is reluctant to keep his feet elevated despite encouragement.  Denies noticing any shortness of breath.  Reports that she last checked his blood glucose last night at approximately 9 AM which was a couple of hours after eating, states it was 161.  Reports blood pressures have been within normal limits.   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 ([name])    Diabetes mellitus without complication ([place])    Dysphagia    post stroke    Hypertension    Stroke [facility])     Past Surgical History:  Procedure Laterality Date   IR ANGIO INTRA EXTRACRAN SEL COM CAROTID INNOMINATE UNI L MOD SED  03/10/2019   IR ANGIO VERTEBRAL SEL SUBCLAVIAN INNOMINATE BILAT MOD SED  03/10/2019   IR CT HEAD LTD  03/10/2019   IR PERCUTANEOUS ART THROMBECTOMY/INFUSION INTRACRANIAL INC DIAG ANGIO  03/10/2019   RADIOLOGY WITH ANESTHESIA N/A 03/10/2019   Procedure: IR WITH ANESTHESIA/CODE STROKE;  Surgeon: Radiologist, Medication, MD;  Location: [place];  Service: Radiology;  Laterality: N/A;    Family History  Problem Relation Age of Onset   Diabetes Other    Hypertension Other    Healthy Mother    Healthy Father     Social History   Socioeconomic History   Marital status: Married    Spouse name: [name]   Number of children: Not on file   Years of education: Not on file   Highest education level: Not on file  Occupational History   Not on file  Tobacco Use   Smoking status: Never   Smokeless tobacco: Never  Vaping Use   Vaping Use: Never used  Substance and Sexual Activity   Alcohol use: No   Drug use: No   Sexual activity: Not Currently  Other Topics Concern   Not on file  Social History Narrative   12/16/19 Lives with wife   Social Determinants of Health   Financial Resource Strain:  Not on file  Food Insecurity: Not on file  Transportation Needs: Not on file  Physical Activity: Not on file  Stress: Not on file  Social Connections: Not on file  Intimate Partner Violence: Not on file    No facility-administered medications prior to visit.   Outpatient Medications Prior to Visit  Medication Sig Dispense Refill   acetaminophen (TYLENOL) 325 MG tablet Take 2 tablets (650 mg total) by mouth every 4 (four) hours as needed for mild pain (or temp > 37.5 C (99.5 F)).     amLODipine (NORVASC) 5 MG tablet Take 1 tablet (5 mg total) by mouth daily. To lower blood pressure 90 tablet 2   aspirin EC 81 MG tablet Take 1 tablet (81 mg total) by  mouth daily. 100 tablet 3   atorvastatin (LIPITOR) 40 MG tablet One daily in the evening to lower cholesterol (Patient taking differently: Take 40 mg by mouth daily. One daily in the evening to lower cholesterol) 90 tablet 2   donepezil (ARICEPT) 10 MG tablet TAKE 1 TABLET BY MOUTH AT BEDTIME (Patient taking differently: Take 10 mg by mouth at bedtime.) 90 tablet 0   metFORMIN (GLUCOPHAGE) 500 MG tablet Take 1 tablet (500 mg total) by mouth 2 (two) times daily with a meal. 120 tablet 1   QUEtiapine (SEROQUEL) 25 MG tablet Take 2 tablets (50 mg total) by mouth at bedtime. 60 tablet 3    No Known Allergies  ROS Review of Systems  Reason unable to perform ROS: wife provides history due to patients disposition.     Objective:    Physical Exam Constitutional:      General: He is not in acute distress.    Appearance: He is not ill-appearing or diaphoretic.     Comments: In wheelchair; sleepy, easily aroused  HENT:     Head: Normocephalic and atraumatic.     Right Ear: Tympanic membrane, ear canal and external ear normal.     Left Ear: Tympanic membrane, ear canal and external ear normal.     Nose: Nose normal.     Mouth/Throat:     Mouth: Mucous membranes are moist.     Pharynx: Oropharynx is clear.  Eyes:     Extraocular Movements: Extraocular movements intact.     Conjunctiva/sclera: Conjunctivae normal.     Pupils: Pupils are equal, round, and reactive to light.  Cardiovascular:     Rate and Rhythm: Normal rate and regular rhythm.  Pulmonary:     Comments: Difficult to hear, patient unwilling to take deep inhalations Musculoskeletal:     Cervical back: Normal range of motion and neck supple.     Comments: In wheelchair  Lymphadenopathy:     Cervical: No cervical adenopathy.  Skin:    General: Skin is warm and dry.  Neurological:     Comments: Unable to evaluate  Psychiatric:     Comments: Unable to evaluate    BP 140/76 (BP Location: Right Arm, Patient Position:  Sitting, Cuff Size: Large)   Pulse 95   Temp 98.2 F (36.8 C) (Oral)   Resp 18   Ht '6\' 3"'  (1.905 m)   SpO2 96%   BMI 21.70 kg/m  Wt Readings from Last 3 Encounters:  02/16/21 175 lb 7.8 oz (79.6 kg)  12/06/20 173 lb 9.6 oz (78.7 kg)  10/04/20 175 lb (79.4 kg)     Health Maintenance Due  Topic Date Due   Zoster Vaccines- Shingrix (1 of 2) Never done  COVID-19 Vaccine (4 - Booster for Pfizer series) 10/26/2020    There are no preventive care reminders to display for this patient.  Lab Results  Component Value Date   TSH 0.647 09/08/2013   Lab Results  Component Value Date   WBC 8.0 02/16/2021   HGB 13.0 02/16/2021   HCT 39.0 02/16/2021   MCV 92.9 02/16/2021   PLT 179 02/16/2021   Lab Results  Component Value Date   NA 135 02/16/2021   K 3.7 02/16/2021   CO2 26 02/16/2021   GLUCOSE 122 (H) 02/16/2021   BUN 10 02/16/2021   CREATININE 0.82 02/16/2021   BILITOT 1.3 (H) 02/15/2021   ALKPHOS 49 02/15/2021   AST 31 02/15/2021   ALT 25 02/15/2021   PROT 6.4 (L) 02/15/2021   ALBUMIN 3.4 (L) 02/15/2021   CALCIUM 9.2 02/16/2021   ANIONGAP 10 02/16/2021   EGFR 100 12/06/2020   Lab Results  Component Value Date   CHOL 159 03/11/2019   Lab Results  Component Value Date   HDL 45 03/11/2019   Lab Results  Component Value Date   LDLCALC 101 (H) 03/11/2019   Lab Results  Component Value Date   TRIG 67 03/11/2019   Lab Results  Component Value Date   CHOLHDL 3.5 03/11/2019   Lab Results  Component Value Date   HGBA1C 5.6 12/06/2020      Assessment & Plan:   Problem List Items Addressed This Visit       Cardiovascular and Mediastinum   Stroke (cerebrum) ([place]) - R PCA s/p mechanical thrombectomy, d/t large vessel dz     Endocrine   Controlled type 2 diabetes mellitus with hyperglycemia, without long-term current use of insulin ([name]) (Chronic)   Relevant Orders   Glucose (CBG) (Completed)   Other Visit Diagnoses     Fever, unspecified    -   Primary   Relevant Orders   POC COVID-19 (Completed)   DG Abd Acute W/Chest   Cough productive of clear sputum       Relevant Medications   cetirizine (ZYRTEC) 10 MG tablet   Other Relevant Orders   DG Abd Acute W/Chest   Ankle edema, bilateral       Relevant Orders   Brain natriuretic peptide   Fatigue, unspecified type       Relevant Orders   POC COVID-19 (Completed)   Comp. Metabolic Panel (12)   TSH   CBC with Differential/Platelet   Seasonal allergies          1. Fever, unspecified Rapid COVID test negative.  Patient has difficulty expelling sputum, slightly elevated temperature, increased fatigue and drowsiness.  Encouraged follow-up with chest x-ray, red flags given for prompt reevaluation - POC COVID-19 - DG Abd Acute W/Chest; Future  2. Cough productive of clear sputum Trial Zyrtec to help with congestion - cetirizine (ZYRTEC) 10 MG tablet; Take 1 tablet (10 mg total) by mouth daily.  Dispense: 30 tablet; Refill: 11 - DG Abd Acute W/Chest; Future  3. Ankle edema, bilateral Patient education given on compression stockings, keeping feet elevated - Brain natriuretic peptide  4. Fatigue, unspecified type  - POC COVID-19 - Comp. Metabolic Panel (12) - TSH - CBC with Differential/Platelet  5. Controlled type 2 diabetes mellitus with hyperglycemia, without long-term current use of insulin ([name])  - Glucose (CBG)  6. Seasonal allergies   7. Cerebrovascular accident (CVA) due to thrombosis of precerebral artery ([place])   I have reviewed the patient's medical history (PMH,  PSH, Social History, Family History, Medications, and allergies) , and have been updated if relevant. I spent 32 minutes reviewing chart and  face to face time with patient.    Meds ordered this encounter  Medications   cetirizine (ZYRTEC) 10 MG tablet    Sig: Take 1 tablet (10 mg total) by mouth daily.    Dispense:  30 tablet    Refill:  11    Order Specific Question:   Supervising  Provider    Answer:   Elsie Stain [[id number]]    Follow-up: Return if symptoms worsen or fail to improve.    Loraine Grip Mayers, PA-C

## 2021-02-15 NOTE — Progress Notes (Signed)
Pharmacy Antibiotic Note  Joe ALBAN Sr. is a 70 y.o. male admitted on 02/15/2021 with sepsis.  Pharmacy has been consulted for vanc and cefepime dosing.  Plan: Vancomycin 1750mg  IV x1, then 1250 mg IV q 12h (eAUC 512, Goal AUC 400-550, SCr 0.83) Cefepime 2g IV every 8 hours Monitor renal function, Cx and clinical progression to narrow Vancomycin levels as needed  Height: 6' (182.9 cm) Weight: 81.6 kg (180 lb) IBW/kg (Calculated) : 77.6  Temp (24hrs), Avg:99.8 F (37.7 C), Min:98.2 F (36.8 C), Max:101.5 F (38.6 C)  Recent Labs  Lab 02/15/21 1442 02/15/21 1632 02/15/21 1642  WBC 8.6  --   --   CREATININE  --  0.83  --   LATICACIDVEN 2.4*  --  1.4     Estimated Creatinine Clearance: 94.8 mL/min (by C-G formula based on SCr of 0.83 mg/dL).    No Known Allergies  Antimicrobials this admission: Cefepime 6/14 >> Vanc 6/14 >> Metronidazole 6/14 >>  Dose adjustments this admission: NA  Microbiology results: 6/14 BCx: pend 6/14 UCx: pend  Bertis Ruddy, PharmD Clinical Pharmacist ED Pharmacist Phone # [phone] 02/15/2021 7:24 PM

## 2021-02-15 NOTE — ED Provider Notes (Signed)
[name] Provider Note   CSN: [id number] Arrival date & time: 02/15/21  1426     History Chief Complaint  Patient presents with   Weakness    Joe COMMINS Sr. is a 70 y.o. male presenting for evaluation of weakness.   Level V caveat, pt is poor historian due to aphasia and cognitive deficit due to previous Joe Perry.   Pt states he has had weakness for several weeks. He states it is worse recently. He reports a fever for 2 weeks. Denies cough, sob, cp, n/v, abd pain, urinary sxs, or abnormal BMs.   Additional history obtained from chart review. Per triage note, family called EMS due to worsening L sided weakness (h/o some weakness due to Joe Perry). LSN noon yesterday (>24 hrs). Pt saw PCP today, who reported cough and runny ose. Pt with weakness and fatigue. Worsened leg swelling    Additional history obtained from patient's wife.  She states 4 days ago patient had right leg swelling.  Yesterday patient developed a fever, she gave him Joe Perry and EMS was called and patient had worsened weakness.  Patient was evaluated, but not brought to the ER at that time.  Today she reports his symptoms persisted, which is what prompted ER evaluation.  She states patient has a chronic cough, nothing new or different.  No other infectious symptoms reported.    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   Benign essential HTN    Cognitive deficit, post-stroke    Controlled type 2 diabetes mellitus with hyperglycemia, without long-term current use of insulin ([place])    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   Dyslipidemia, goal LDL below 70 03/12/2019   Dysphagia due to recent stroke 03/12/2019   Stroke (cerebrum) ([place]) - R PCA s/p mechanical thrombectomy, d/t large vessel dz 03/10/2019   Occlusion of right posterior communicating artery 03/10/2019   Abnormality of gait    Gait disturbance, post-stroke    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    Past Surgical History:  Procedure Laterality Date   IR ANGIO INTRA EXTRACRAN SEL COM CAROTID INNOMINATE UNI L MOD SED  03/10/2019   IR ANGIO VERTEBRAL SEL SUBCLAVIAN INNOMINATE BILAT MOD SED  03/10/2019   IR CT HEAD LTD  03/10/2019   IR PERCUTANEOUS ART THROMBECTOMY/INFUSION INTRACRANIAL INC DIAG ANGIO  03/10/2019   RADIOLOGY WITH ANESTHESIA N/A 03/10/2019   Procedure: IR WITH ANESTHESIA/CODE STROKE;  Surgeon: Radiologist, Medication, MD;  Location: [place];  Service: Radiology;  Laterality: N/A;       Family History  Problem Relation Age of Onset   Diabetes Other    Hypertension Other    Healthy Mother    Healthy Father     Social History   Tobacco Use   Smoking status: Never   Smokeless tobacco: Never  Vaping Use   Vaping Use: Never used  Substance Use Topics   Alcohol use: No   Drug use: No    Home Medications Prior to Admission medications   Medication Sig Start Date End  Date Taking? Authorizing Provider  acetaminophen (Joe Perry) 325 MG tablet Take 2 tablets (650 mg total) by mouth every 4 (four) hours as needed for mild pain (or temp > 37.5 C (99.5 F)). 06/19/19   Angiulli, Lavon Paganini, PA-C  amLODipine (NORVASC) 5 MG tablet Take 1 tablet (5 mg total) by mouth daily. To lower blood pressure 10/04/20   Elsie Stain, MD  aspirin EC 81 MG tablet Take 1 tablet (81 mg total) by mouth daily. 07/26/20   Elsie Stain, MD  atorvastatin (LIPITOR) 40 MG tablet One daily in the evening to lower cholesterol 10/04/20   Elsie Stain, MD  cetirizine (ZYRTEC) 10 MG tablet Take 1 tablet (10 mg  total) by mouth daily. 02/15/21   Mayers, Cari S, PA-C  donepezil (ARICEPT) 10 MG tablet TAKE 1 TABLET BY MOUTH AT BEDTIME 01/15/21   Elsie Stain, MD  metFORMIN (GLUCOPHAGE) 500 MG tablet Take 1 tablet (500 mg total) by mouth 2 (two) times daily with a meal. 12/06/20 01/05/21  Elsie Stain, MD  QUEtiapine (SEROQUEL) 25 MG tablet Take 2 tablets (50 mg total) by mouth at bedtime. 10/04/20   Elsie Stain, MD    Allergies    Patient has no known allergies.  Review of Systems   Review of Systems  Unable to perform ROS: Other   Physical Exam Updated Vital Signs BP (!) 157/112 (BP Location: Left Arm)   Pulse (!) 104   Temp (!) 101.5 F (38.6 C) (Oral)   Resp 18   Ht 6' (1.829 m)   Wt 81.6 kg   SpO2 95%   BMI 24.41 kg/m   Physical Exam Vitals and nursing note reviewed.  Constitutional:      General: He is not in acute distress.    Appearance: Normal appearance.  HENT:     Head: Normocephalic and atraumatic.  Eyes:     Extraocular Movements: Extraocular movements intact.     Conjunctiva/sclera: Conjunctivae normal.     Pupils: Pupils are equal, round, and reactive to light.  Cardiovascular:     Rate and Rhythm: Regular rhythm. Tachycardia present.     Pulses: Normal pulses.     Comments: Tachycardic in the low 100s Pulmonary:     Effort: Pulmonary effort is normal. No respiratory distress.     Breath sounds: Rhonchi present. No wheezing.     Comments: Wet lung sounds Abdominal:     General: There is no distension.     Palpations: Abdomen is soft.     Tenderness: There is no abdominal tenderness.  Musculoskeletal:        General: Normal range of motion.     Cervical back: Normal range of motion and neck supple.     Right lower leg: Edema present.     Left lower leg: Edema present.     Comments: Minimal pitting edema noted bilaterally  Diffuse weakness in all extremities, however no focal weakness noted on my exam  Skin:    General: Skin is warm and dry.      Capillary Refill: Capillary refill takes less than 2 seconds.  Neurological:     Mental Status: He is lethargic.     GCS: GCS eye subscore is 3. GCS verbal subscore is 5. GCS motor subscore is 6.     Cranial Nerves: No facial asymmetry.     Sensory: Sensation is intact.     Motor: No pronator drift.     Comments: Patient appears very  sleepy.  Mostly answering questions appropriately and following most commands, but not all.  Psychiatric:        Mood and Affect: Mood and affect normal.        Speech: Speech normal.        Behavior: Behavior normal.    ED Results / Procedures / Treatments   Labs (all labs ordered are listed, but only abnormal results are displayed) Labs Reviewed  RESP PANEL BY RT-PCR (FLU A&B, COVID) [id number]  CULTURE, BLOOD (ROUTINE X 2)  CULTURE, BLOOD (ROUTINE X 2)  URINE CULTURE  LACTIC ACID, PLASMA  LACTIC ACID, PLASMA  COMPREHENSIVE METABOLIC PANEL  CBC WITH DIFFERENTIAL/PLATELET  PROTIME-INR  APTT  URINALYSIS, ROUTINE W REFLEX MICROSCOPIC  BRAIN NATRIURETIC PEPTIDE    EKG EKG Interpretation  Date/Time:  Tuesday February 15 2021 14:32:48 EDT Ventricular Rate:  106 PR Interval:  152 QRS Duration: 81 QT Interval:  320 QTC Calculation: 425 R Axis:   -7 Text Interpretation: Sinus tachycardia Ventricular premature complex Abnrm T, consider ischemia, anterolateral lds T wave changes similar to July 2021 Confirmed by Sherwood Gambler [phone]) on 02/15/2021 2:42:32 PM  Radiology No results found.  Procedures .Critical Care  Date/Time: 02/15/2021 3:22 PM Performed by: Franchot Heidelberg, PA-C Authorized by: Franchot Heidelberg, PA-C   Critical care provider statement:    Critical care time (minutes):  40   Critical care time was exclusive of:  Separately billable procedures and treating other patients and teaching time   Critical care was necessary to treat or prevent imminent or life-threatening deterioration of the following conditions:  Sepsis and CNS  failure or compromise   Critical care was time spent personally by me on the following activities:  Blood draw for specimens, development of treatment plan with patient or surrogate, evaluation of patient's response to treatment, examination of patient, obtaining history from patient or surrogate, ordering and performing treatments and interventions, ordering and review of laboratory studies, review of old charts, pulse oximetry, ordering and review of radiographic studies and re-evaluation of patient's condition   I assumed direction of critical care for this patient from another provider in my specialty: no     Medications Ordered in ED Medications  ceFEPIme (MAXIPIME) 2 g in sodium chloride 0.9 % 100 mL IVPB (has no administration in time range)  metroNIDAZOLE (FLAGYL) IVPB 500 mg (has no administration in time range)  vancomycin (VANCOREADY) IVPB 1750 mg/350 mL (has no administration in time range)    ED Course  I have reviewed the triage vital signs and the nursing notes.  Pertinent labs & imaging results that were available during my care of the patient were reviewed by me and considered in my medical decision making (see chart for details).    MDM Rules/Calculators/A&P                          Patient presenting for evaluation of weakness, fever, tenderness.  On exam, patient is febrile and tachycardic.  He is very sleepy, in the setting of reported weakness, code sepsis called.  Urine and chest studies ordered.  Sepsis labs ordered.  Broad-spectrum antibiotics started.  As patient is not hypotensive, and does has signs of possible fluid overload, IV fluids held at this time.  CT head ordered due to patient's confusion and history of recurrent strokes.  He is not within any window for tPA or IR procedures.  Cxr viewed and independently interpreted by me, no pna, pnx, effusion.  Pt signed out to Sherron Monday, PA-C for f/u on labs and CT. Likely plan for admission.   Final Clinical  Impression(s) / ED Diagnoses Final diagnoses:  None    Rx / DC Orders ED Discharge Orders     None        Franchot Heidelberg, PA-C 02/15/21 1540    Sherwood Gambler, MD 02/16/21 1300

## 2021-02-15 NOTE — ED Provider Notes (Signed)
Accepted handoff at shift change from S. Caccavale PA-C. Please see prior provider note for more detail.   Briefly: Patient is 70 y.o. 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  Also with past medical history significant for DM 2, HTN, left hemiparesis, HLD, abnormal gait using walker, ataxia  DDX: concern for sepsis.  Code sepsis was initiated and broad-spectrum antibiotics already started.  Plan: Follow-up on labs and imaging ordered.  Ultimately will need admission.   Physical Exam  BP (!) 164/93   Pulse 79   Temp 99.7 F (37.6 C) (Oral)   Resp (!) 21   Ht 6' (1.829 m)   Wt 81.6 kg   SpO2 95%   BMI 24.41 kg/m   Physical Exam  ED Course/Procedures   Clinical Course as of 02/15/21 2017  Tue Feb 15, 2021  1747 CT chest with questionable developing infiltrate patient started and placed on broad-spectrum antibiotics by prior provider. ([facility]) [WF]  1934 IMPRESSION: Atrophy with multiple prior infarcts, largest in the right occipital lobe. No acute infarct. No mass or hemorrhage.   There are foci of arterial vascular calcification. There is mucosal thickening in several ethmoid air cells. [WF]  2010 Temp(!): 101.5 F (38.6 C) [WF]  2010 Pulse Rate(!): 104 [WF]  2010 Pulse Rate: 79 [WF]  2010 Temp: 99.7 F (37.6 C) [WF]    Clinical Course User Index [WF] Tedd Sias, [place]    .Critical Care  Date/Time: 02/15/2021 7:36 PM Performed by: Tedd Sias, PA Authorized by: Tedd Sias, PA   Critical care provider statement:    Critical care time (minutes):  35   Critical care time was exclusive of:  Separately billable procedures and treating other patients and teaching time   Critical care was necessary to treat or prevent imminent or life-threatening deterioration of the following conditions:  Sepsis   Critical care was time spent personally by me on the following activities:  Discussions with consultants,  evaluation of patient's response to treatment, examination of patient, review of old charts, re-evaluation of patient's condition, pulse oximetry, ordering and review of radiographic studies, ordering and review of laboratory studies and ordering and performing treatments and interventions   I assumed direction of critical care for this patient from another provider in my specialty: no   Results for orders placed or performed during the hospital encounter of 02/15/21  Resp Panel by RT-PCR (Flu A&B, Covid) Nasopharyngeal Swab   Specimen: Nasopharyngeal Swab; Nasopharyngeal(NP) swabs in vial transport medium  Result Value Ref Range   SARS Coronavirus 2 by RT PCR NEGATIVE NEGATIVE   Influenza A by PCR NEGATIVE NEGATIVE   Influenza B by PCR NEGATIVE NEGATIVE  Lactic acid, plasma  Result Value Ref Range   Lactic Acid, Venous 2.4 (HH) 0.5 - 1.9 mmol/L  Lactic acid, plasma  Result Value Ref Range   Lactic Acid, Venous 1.4 0.5 - 1.9 mmol/L  CBC WITH DIFFERENTIAL  Result Value Ref Range   WBC 8.6 4.0 - 10.5 K/uL   RBC 4.40 4.22 - 5.81 MIL/uL   Hemoglobin 13.5 13.0 - 17.0 g/dL   HCT 42.0 39.0 - 52.0 %   MCV 95.5 80.0 - 100.0 fL   MCH 30.7 26.0 - 34.0 pg   MCHC 32.1 30.0 - 36.0 g/dL   RDW 12.2 11.5 - 15.5 %   Platelets 197 150 - 400 K/uL   nRBC 0.0 0.0 - 0.2 %   Neutrophils Relative %  80 %   Neutro Abs 7.0 1.7 - 7.7 K/uL   Lymphocytes Relative 13 %   Lymphs Abs 1.1 0.7 - 4.0 K/uL   Monocytes Relative 6 %   Monocytes Absolute 0.5 0.1 - 1.0 K/uL   Eosinophils Relative 0 %   Eosinophils Absolute 0.0 0.0 - 0.5 K/uL   Basophils Relative 1 %   Basophils Absolute 0.0 0.0 - 0.1 K/uL   Immature Granulocytes 0 %   Abs Immature Granulocytes 0.02 0.00 - 0.07 K/uL  Brain natriuretic peptide  Result Value Ref Range   B Natriuretic Peptide 173.5 (H) 0.0 - 100.0 pg/mL  Protime-INR  Result Value Ref Range   Prothrombin Time 14.5 11.4 - 15.2 seconds   INR 1.1 0.8 - 1.2  APTT  Result Value Ref Range    aPTT 26 24 - 36 seconds  Comprehensive metabolic panel  Result Value Ref Range   Sodium 137 135 - 145 mmol/L   Potassium 3.7 3.5 - 5.1 mmol/L   Chloride 102 98 - 111 mmol/L   CO2 25 22 - 32 mmol/L   Glucose, Bld 109 (H) 70 - 99 mg/dL   BUN 13 8 - 23 mg/dL   Creatinine, Ser 0.83 0.61 - 1.24 mg/dL   Calcium 8.7 (L) 8.9 - 10.3 mg/dL   Total Protein 6.4 (L) 6.5 - 8.1 g/dL   Albumin 3.4 (L) 3.5 - 5.0 g/dL   AST 31 15 - 41 U/L   ALT 25 0 - 44 U/L   Alkaline Phosphatase 49 38 - 126 U/L   Total Bilirubin 1.3 (H) 0.3 - 1.2 mg/dL   GFR, Estimated >60 >60 mL/min   Anion gap 10 5 - 15   CT Head Wo Contrast  Result Date: 02/15/2021 CLINICAL DATA:  Altered mental status EXAM: CT HEAD WITHOUT CONTRAST TECHNIQUE: Contiguous axial images were obtained from the base of the skull through the vertex without intravenous contrast. COMPARISON:  April 11, 2019 head CT; brain MRI July 02, 2019 FINDINGS: Brain: Mild to moderate diffuse atrophy is stable. There is no appreciable intracranial mass, hemorrhage, extra-axial fluid collection, or midline shift. There is evidence of a prior infarct involving the superomedial aspect of the right occipital lobe, also present on prior studies. There are prior infarcts involving portions of the head of the caudate nucleus on the right as well as the adjacent anterior limb of the right internal capsule. There is evidence of a prior infarct in the left anterior lentiform nucleus with involvement of a portion of the anterior limb of the left internal capsule. There is evidence of a prior infarct involving the mid anterior right frontal lobe. There is small vessel disease in the centra semiovale bilaterally. There is evidence of a prior infarct in the lateral mid to upper left cerebellar hemisphere. No acute appearing infarct evident. Vascular: No hyperdense vessel. There is calcification in each carotid siphon region. Skull: The bony calvarium appears intact. Sinuses/Orbits:  There is mucosal thickening in several ethmoid air cells. Visualized orbits appear symmetric bilaterally. Other: Mastoid air cells are clear. IMPRESSION: Atrophy with multiple prior infarcts, largest in the right occipital lobe. No acute infarct. No mass or hemorrhage. There are foci of arterial vascular calcification. There is mucosal thickening in several ethmoid air cells. Electronically Signed   By: Lowella Grip III M.D.   On: 02/15/2021 17:09   CT Chest Wo Contrast  Result Date: 02/15/2021 CLINICAL DATA:  70 year old male with pneumonia. EXAM: CT CHEST WITHOUT CONTRAST TECHNIQUE: Multidetector  CT imaging of the chest was performed following the standard protocol without IV contrast. COMPARISON:  Chest radiograph dated 02/15/2021. FINDINGS: Evaluation of this exam is limited in the absence of intravenous contrast. Cardiovascular: Top-normal cardiac size. Trace pericardial effusion. There is mild atherosclerotic calcification of the aortic arch. The central pulmonary arteries are grossly unremarkable on this noncontrast CT. Mediastinum/Nodes: No hilar or mediastinal adenopathy. Evaluation however is limited in the absence of intravenous contrast and secondary to respiratory motion. The esophagus and the thyroid gland are grossly unremarkable. No mediastinal fluid collection. Lungs/Pleura: Faint ground-glass density in the left upper lobe may represent atelectasis but concerning for developing infiltrate. Clinical correlation recommended. No lobar consolidation, pleural effusion, or pneumothorax. The central airways are patent. Mucus secretions noted in the left lower lobe bronchus. Aspiration is not excluded. Upper Abdomen: No acute abnormality. Musculoskeletal: Degenerative changes of the spine. No acute osseous pathology. IMPRESSION: 1. Faint ground-glass density in the left upper lobe concerning for developing infiltrate. Clinical correlation recommended. 2. Mucus secretions in the left lower lobe  bronchus. Aspiration is not excluded. 3. Aortic Atherosclerosis (ICD10-I70.0). Electronically Signed   By: Anner Crete M.D.   On: 02/15/2021 17:13   DG Chest Port 1 View  Result Date: 02/15/2021 CLINICAL DATA:  Left-sided weakness. EXAM: PORTABLE CHEST 1 VIEW COMPARISON:  PA and lateral chest 10/09/2020. FINDINGS: The patient is rotated on the exam. Lungs are clear. Heart size is normal. Aortic atherosclerosis. No pneumothorax or pleural fluid. No acute or focal bony abnormality. IMPRESSION: No acute disease. Aortic Atherosclerosis (ICD10-I70.0). Electronically Signed   By: Inge Rise M.D.   On: 02/15/2021 15:24    MDM   Patient is 70 year old male with past medical history detailed in HPI who presented today with fatigue weakness for the past several weeks seems to have worsened over the past week.  Per wife he has been congested worse recently but has not been significantly coughing.  Developed a fever yesterday and seemed to be more lethargic and fatigued and stopped speaking yesterday as well.    On initial exam patient was quite fatigued appearing Relatively poor historian initially --on reevaluation after Tylenol and her fever has improved patient is no longer tachycardic and is a much better historian.  Still is only oriented to self, place and needs some hints" the year is 20-blank" for date  Prior provider had already initiated code sepsis and treated fever, initiated broad-spectrum antibiotic therapy.  Seems are to be benefiting from this initial treatment regimen.   CXR unremarkable. No obvious infiltrate will obtain CT scan of chest because of concerns for pneumonia given the abnormal lung sounds and tachycardia and obvious risk for aspiration.   8:17 PM  Discussed with Dr. Sheran Luz of internal medicine who will admit.  Patient's fever and tachycardia has resolved with a small to 50 mL bolus of fluids and Tylenol.  I suspect that patient either has early developing  pneumonia that will become more visible as his symptoms progress versus viral illness.  Broad-spectrum antibiotics were started already he is improving clinically.      Pati Gallo [place], [place] 02/15/21 2018    Dorie Rank, MD 02/16/21 [phone]

## 2021-02-16 ENCOUNTER — Telehealth: Payer: Self-pay | Admitting: *Deleted

## 2021-02-16 ENCOUNTER — Encounter (HOSPITAL_COMMUNITY): Payer: Self-pay | Admitting: Internal Medicine

## 2021-02-16 DIAGNOSIS — R509 Fever, unspecified: Secondary | ICD-10-CM | POA: Insufficient documentation

## 2021-02-16 DIAGNOSIS — R058 Other specified cough: Secondary | ICD-10-CM | POA: Insufficient documentation

## 2021-02-16 DIAGNOSIS — M25471 Effusion, right ankle: Secondary | ICD-10-CM | POA: Insufficient documentation

## 2021-02-16 DIAGNOSIS — J189 Pneumonia, unspecified organism: Secondary | ICD-10-CM | POA: Diagnosis not present

## 2021-02-16 DIAGNOSIS — R5383 Other fatigue: Secondary | ICD-10-CM | POA: Insufficient documentation

## 2021-02-16 LAB — GLUCOSE, CAPILLARY
Glucose-Capillary: 106 mg/dL — ABNORMAL HIGH (ref 70–99)
Glucose-Capillary: 108 mg/dL — ABNORMAL HIGH (ref 70–99)
Glucose-Capillary: 113 mg/dL — ABNORMAL HIGH (ref 70–99)
Glucose-Capillary: 125 mg/dL — ABNORMAL HIGH (ref 70–99)
Glucose-Capillary: 135 mg/dL — ABNORMAL HIGH (ref 70–99)
Glucose-Capillary: 200 mg/dL — ABNORMAL HIGH (ref 70–99)

## 2021-02-16 LAB — CBC
HCT: 39 % (ref 39.0–52.0)
Hemoglobin: 13 g/dL (ref 13.0–17.0)
MCH: 31 pg (ref 26.0–34.0)
MCHC: 33.3 g/dL (ref 30.0–36.0)
MCV: 92.9 fL (ref 80.0–100.0)
Platelets: 179 10*3/uL (ref 150–400)
RBC: 4.2 MIL/uL — ABNORMAL LOW (ref 4.22–5.81)
RDW: 12 % (ref 11.5–15.5)
WBC: 8 10*3/uL (ref 4.0–10.5)
nRBC: 0 % (ref 0.0–0.2)

## 2021-02-16 LAB — BASIC METABOLIC PANEL
Anion gap: 10 (ref 5–15)
BUN: 10 mg/dL (ref 8–23)
CO2: 26 mmol/L (ref 22–32)
Calcium: 9.2 mg/dL (ref 8.9–10.3)
Chloride: 99 mmol/L (ref 98–111)
Creatinine, Ser: 0.82 mg/dL (ref 0.61–1.24)
GFR, Estimated: >60 mL/min (ref >60)
Glucose, Bld: 122 mg/dL — ABNORMAL HIGH (ref 70–99)
Potassium: 3.7 mmol/L (ref 3.5–5.1)
Sodium: 135 mmol/L (ref 135–145)

## 2021-02-16 LAB — COMP. METABOLIC PANEL (12)
AST: 28 IU/L (ref 0–40)
Albumin/Globulin Ratio: 1.8 (ref 1.2–2.2)
Albumin: 4.6 g/dL (ref 3.8–4.8)
Alkaline Phosphatase: 77 IU/L (ref 44–121)
BUN/Creatinine Ratio: 17 (ref 10–24)
BUN: 15 mg/dL (ref 8–27)
Bilirubin Total: 0.6 mg/dL (ref 0.0–1.2)
Calcium: 9.6 mg/dL (ref 8.6–10.2)
Chloride: 101 mmol/L (ref 96–106)
Creatinine, Ser: 0.86 mg/dL (ref 0.76–1.27)
Globulin, Total: 2.6 g/dL (ref 1.5–4.5)
Glucose: 175 mg/dL — ABNORMAL HIGH (ref 65–99)
Potassium: 4.2 mmol/L (ref 3.5–5.2)
Sodium: 139 mmol/L (ref 134–144)
Total Protein: 7.2 g/dL (ref 6.0–8.5)
eGFR: 95 mL/min/{1.73_m2} (ref >59)

## 2021-02-16 LAB — CBC WITH DIFFERENTIAL/PLATELET
Basophils Absolute: 0 10*3/uL (ref 0.0–0.2)
Basos: 0 %
EOS (ABSOLUTE): 0 10*3/uL (ref 0.0–0.4)
Eos: 0 %
Hematocrit: 40.5 % (ref 37.5–51.0)
Hemoglobin: 12.9 g/dL — ABNORMAL LOW (ref 13.0–17.7)
Immature Grans (Abs): 0 10*3/uL (ref 0.0–0.1)
Immature Granulocytes: 1 %
Lymphocytes Absolute: 1.1 10*3/uL (ref 0.7–3.1)
Lymphs: 13 %
MCH: 30.3 pg (ref 26.6–33.0)
MCHC: 31.9 g/dL (ref 31.5–35.7)
MCV: 95 fL (ref 79–97)
Monocytes Absolute: 0.5 10*3/uL (ref 0.1–0.9)
Monocytes: 6 %
Neutrophils Absolute: 6.6 10*3/uL (ref 1.4–7.0)
Neutrophils: 80 %
Platelets: 188 10*3/uL (ref 150–450)
RBC: 4.26 x10E6/uL (ref 4.14–5.80)
RDW: 11.8 % (ref 11.6–15.4)
WBC: 8.2 10*3/uL (ref 3.4–10.8)

## 2021-02-16 LAB — HIV ANTIBODY (ROUTINE TESTING W REFLEX): HIV Screen 4th Generation wRfx: NONREACTIVE

## 2021-02-16 LAB — BRAIN NATRIURETIC PEPTIDE: BNP: 162.8 pg/mL — ABNORMAL HIGH (ref 0.0–100.0)

## 2021-02-16 LAB — TSH: TSH: 0.178 u[IU]/mL — ABNORMAL LOW (ref 0.450–4.500)

## 2021-02-16 LAB — HEMOGLOBIN A1C
Hgb A1c MFr Bld: 5.9 % — ABNORMAL HIGH (ref 4.8–5.6)
Mean Plasma Glucose: 123 mg/dL

## 2021-02-16 MED ORDER — ASPIRIN EC 81 MG PO TBEC
81.0000 mg | DELAYED_RELEASE_TABLET | Freq: Every day | ORAL | Status: DC
  Administered 2021-02-16 – 2021-02-17 (×2): 81 mg via ORAL
  Filled 2021-02-16 (×2): qty 1

## 2021-02-16 MED ORDER — ATORVASTATIN CALCIUM 40 MG PO TABS
40.0000 mg | ORAL_TABLET | Freq: Every day | ORAL | Status: DC
  Administered 2021-02-16 – 2021-02-17 (×2): 40 mg via ORAL
  Filled 2021-02-16 (×2): qty 1

## 2021-02-16 MED ORDER — DONEPEZIL HCL 10 MG PO TABS
10.0000 mg | ORAL_TABLET | Freq: Every day | ORAL | Status: DC
  Administered 2021-02-16: 10 mg via ORAL
  Filled 2021-02-16: qty 1

## 2021-02-16 MED ORDER — AMLODIPINE BESYLATE 5 MG PO TABS
5.0000 mg | ORAL_TABLET | Freq: Every day | ORAL | Status: DC
  Administered 2021-02-16 – 2021-02-17 (×2): 5 mg via ORAL
  Filled 2021-02-16 (×2): qty 1

## 2021-02-16 MED ORDER — QUETIAPINE FUMARATE 50 MG PO TABS
50.0000 mg | ORAL_TABLET | Freq: Every day | ORAL | Status: DC
  Administered 2021-02-16: 50 mg via ORAL
  Filled 2021-02-16: qty 1

## 2021-02-16 MED ORDER — INSULIN ASPART 100 UNIT/ML IJ SOLN
0.0000 [IU] | Freq: Three times a day (TID) | INTRAMUSCULAR | Status: DC
Start: 2021-02-16 — End: 2021-02-17
  Administered 2021-02-16: 1 [IU] via SUBCUTANEOUS

## 2021-02-16 NOTE — Telephone Encounter (Signed)
-----   Message from Kennieth Rad, [place] sent at 02/16/2021  4:41 PM EDT ----- Please call patient and let him know that we understand he is currently hospitalized.  Please encourage him to follow-up promptly after his hospital discharge.

## 2021-02-16 NOTE — Telephone Encounter (Signed)
MA spoke with patients wife. Patient verified DOB Wife will follow up with [facility] once patient has been discharged.

## 2021-02-16 NOTE — Progress Notes (Signed)
PROGRESS NOTE    Joe Eddy Sr.  CBJ:[id number] DOB: [birth year] DOA: 02/15/2021 PCP: Elsie Stain, MD     Brief Narrative:  Joe Eddy Sr. is a 70 year old male with past medical history significant for previous strokes with residual aphasia and hemiparesis, diabetes mellitus, hypertension, dysphagia, cognitive deficits.  Apparently his aphasia gets worse whenever he gets ill with an infection.  Per family, EMS was called due to worsening left-sided weakness 4 days ago, patient developed a fever day before admission.  EMS evaluated patient, but patient was not brought to the ED at that time.  Patient now presenting with fever, work-up revealed suspicion for pneumonia in the left upper and left lower lobe.  New events last 24 hours / Subjective: Patient without any new complaints, remains slightly aphasic.  Denies any significant cough, although coughing during my evaluation.  States that normally he walks with a walker at home.  Assessment & Plan:   Principal Problem:   Left upper lobe pneumonia Active Problems:   Dysphagia due to recent stroke   Controlled type 2 diabetes mellitus with hyperglycemia, without long-term current use of insulin ([name])   Cognitive deficit, post-stroke   Benign essential HTN   Left hemiparesis ([place])   Acute metabolic encephalopathy   Sepsis secondary to CAP, with concern for aspiration pneumonia -Presented with fever of 101.5, tachycardia 104  -Blood cultures pending -Continue Rocephin, azithromycin -SLP evaluation  Acute metabolic encephalopathy with underlying dementia -Secondary to above infection -Seems to be improved this morning -Continue home Seroquel, Aricept  Diabetes mellitus type 2 -Continue SSI  Hypertension -Continue Norvasc  History of stroke with residual left-sided hemiparesis, cognitive deficit, dysphagia, aphasia -PT OT SLP -Continue aspirin, Lipitor   DVT prophylaxis:  enoxaparin (LOVENOX) injection 40 mg  Start: 02/15/21 2100  Code Status:     Code Status Orders  (From admission, onward)           Start     Ordered   02/15/21 2018  Full code  Continuous        02/15/21 2022           Code Status History     Date Active Date Inactive Code Status Order ID Comments User Context   06/03/2019 1550 06/20/2019 1346 Full Code [id number]  Elizabeth Sauer Inpatient   03/10/2019 1847 03/24/2019 1623 Full Code [id number]  Luanne Bras, MD Inpatient   03/10/2019 1534 03/10/2019 1847 Full Code [id number]  Greta Doom, MD ED   01/31/2016 0634 02/02/2016 2119 Full Code [id number]  Danford, Suann Larry, MD Inpatient      Family Communication: None at bedside Disposition Plan:  Status is: Inpatient  Remains inpatient appropriate because:IV treatments appropriate due to intensity of illness or inability to take PO and Inpatient level of care appropriate due to severity of illness  Dispo: The patient is from: Home              Anticipated d/c is to: Home              Patient currently is not medically stable to d/c.   Difficult to place patient No      Consultants:  None  Procedures:  None  Antimicrobials:  Anti-infectives (From admission, onward)    Start     Dose/Rate Route Frequency Ordered Stop   02/16/21 0600  vancomycin (VANCOREADY) IVPB 1250 mg/250 mL  Status:  Discontinued        1,250 mg 166.7 mL/hr over  90 Minutes Intravenous Every 12 hours 02/15/21 1942 02/15/21 2022   02/16/21 0600  cefTRIAXone (ROCEPHIN) 2 g in sodium chloride 0.9 % 100 mL IVPB        2 g 200 mL/hr over 30 Minutes Intravenous Every 24 hours 02/15/21 2022 02/21/21 0559   02/15/21 2300  ceFEPIme (MAXIPIME) 2 g in sodium chloride 0.9 % 100 mL IVPB  Status:  Discontinued        2 g 200 mL/hr over 30 Minutes Intravenous Every 8 hours 02/15/21 1941 02/15/21 2047   02/15/21 2030  azithromycin (ZITHROMAX) 500 mg in sodium chloride 0.9 % 250 mL IVPB        500 mg 250 mL/hr over 60  Minutes Intravenous Every 24 hours 02/15/21 2022 02/20/21 2029   02/15/21 1445  ceFEPIme (MAXIPIME) 2 g in sodium chloride 0.9 % 100 mL IVPB        2 g 200 mL/hr over 30 Minutes Intravenous  Once 02/15/21 1442 02/15/21 1659   02/15/21 1445  metroNIDAZOLE (FLAGYL) IVPB 500 mg        500 mg 100 mL/hr over 60 Minutes Intravenous  Once 02/15/21 1442 02/15/21 1731   02/15/21 1445  vancomycin (VANCOREADY) IVPB 1750 mg/350 mL        1,750 mg 175 mL/hr over 120 Minutes Intravenous  Once 02/15/21 1442 02/15/21 1908        Objective: Vitals:   02/16/21 0352 02/16/21 0401 02/16/21 0749 02/16/21 1241  BP: (!) 171/86 (!) 146/88 (!) 144/79 137/79  Pulse: (!) 103 97 91 93  Resp: 18  18 16   Temp: 98.3 F (36.8 C)  98.6 F (37 C) 98 F (36.7 C)  TempSrc: Oral  Oral   SpO2: 97%  97% 97%  Weight:      Height:        Intake/Output Summary (Last 24 hours) at 02/16/2021 1350 Last data filed at 02/16/2021 1300 Gross per 24 hour  Intake 320 ml  Output 1650 ml  Net -1330 ml   Filed Weights   02/15/21 1440 02/16/21 0009  Weight: 81.6 kg 79.6 kg    Examination:  General exam: Appears calm and comfortable  Respiratory system: Respiratory effort normal. No respiratory distress. No conversational dyspnea.  Cardiovascular system: S1 & S2 heard, RRR. No murmurs. No pedal edema. Gastrointestinal system: Abdomen is nondistended, soft and nontender. Normal bowel sounds heard. Central nervous system: Alert, +slight aphasia, moves all extremities spontaneously  Extremities: Symmetric in appearance  Skin: No rashes, lesions or ulcers on exposed skin  Psychiatry: Mood & affect appropriate.   Data Reviewed: I have personally reviewed following labs and imaging studies  CBC: Recent Labs  Lab 02/15/21 1442 02/16/21 0042  WBC 8.6 8.0  NEUTROABS 7.0  --   HGB 13.5 13.0  HCT 42.0 39.0  MCV 95.5 92.9  PLT 197 106   Basic Metabolic Panel: Recent Labs  Lab 02/15/21 1632 02/16/21 0042  NA 137  135  K 3.7 3.7  CL 102 99  CO2 25 26  GLUCOSE 109* 122*  BUN 13 10  CREATININE 0.83 0.82  CALCIUM 8.7* 9.2   GFR: Estimated Creatinine Clearance: 95.9 mL/min (by C-G formula based on SCr of 0.82 mg/dL). Liver Function Tests: Recent Labs  Lab 02/15/21 1632  AST 31  ALT 25  ALKPHOS 49  BILITOT 1.3*  PROT 6.4*  ALBUMIN 3.4*   No results for input(s): LIPASE, AMYLASE in the last 168 hours. No results for input(s): AMMONIA in the  last 168 hours. Coagulation Profile: Recent Labs  Lab 02/15/21 1700  INR 1.1   Cardiac Enzymes: No results for input(s): CKTOTAL, CKMB, CKMBINDEX, TROPONINI in the last 168 hours. BNP (last 3 results) No results for input(s): PROBNP in the last 8760 hours. HbA1C: No results for input(s): HGBA1C in the last 72 hours. CBG: Recent Labs  Lab 02/15/21 2121 02/16/21 0029 02/16/21 0351 02/16/21 0750 02/16/21 1135  GLUCAP 103* 125* 106* 108* 113*   Lipid Profile: No results for input(s): CHOL, HDL, LDLCALC, TRIG, CHOLHDL, LDLDIRECT in the last 72 hours. Thyroid Function Tests: No results for input(s): TSH, T4TOTAL, FREET4, T3FREE, THYROIDAB in the last 72 hours. Anemia Panel: No results for input(s): VITAMINB12, FOLATE, FERRITIN, TIBC, IRON, RETICCTPCT in the last 72 hours. Sepsis Labs: Recent Labs  Lab 02/15/21 1442 02/15/21 1642  LATICACIDVEN 2.4* 1.4    Recent Results (from the past 240 hour(s))  Blood Culture (routine x 2)     Status: None (Preliminary result)   Collection Time: 02/15/21  3:14 PM   Specimen: BLOOD  Result Value Ref Range Status   Specimen Description BLOOD SITE NOT SPECIFIED  Final   Special Requests   Final    BOTTLES DRAWN AEROBIC AND ANAEROBIC Blood Culture results may not be optimal due to an inadequate volume of blood received in culture bottles   Culture   Final    NO GROWTH < 24 HOURS Performed at [facility], [place] [address] [place], [place] [address]    Report Status PENDING  Incomplete  Resp  Panel by RT-PCR (Flu A&B, Covid) Nasopharyngeal Swab     Status: None   Collection Time: 02/15/21  4:25 PM   Specimen: Nasopharyngeal Swab; Nasopharyngeal(NP) swabs in vial transport medium  Result Value Ref Range Status   SARS Coronavirus 2 by RT PCR NEGATIVE NEGATIVE Final    Comment: (NOTE) SARS-CoV-2 target nucleic acids are NOT DETECTED.  The SARS-CoV-2 RNA is generally detectable in upper respiratory specimens during the acute phase of infection. The lowest concentration of SARS-CoV-2 viral copies this assay can detect is 138 copies/mL. A negative result does not preclude SARS-Cov-2 infection and should not be used as the sole basis for treatment or other patient management decisions. A negative result may occur with  improper specimen collection/handling, submission of specimen other than nasopharyngeal swab, presence of viral mutation(s) within the areas targeted by this assay, and inadequate number of viral copies(<138 copies/mL). A negative result must be combined with clinical observations, patient history, and epidemiological information. The expected result is Negative.  Fact Sheet for Patients:  EntrepreneurPulse.com.au  Fact Sheet for Healthcare Providers:  IncredibleEmployment.be  This test is no t yet approved or cleared by the [place] FDA and  has been authorized for detection and/or diagnosis of SARS-CoV-2 by FDA under an Emergency Use Authorization (EUA). This EUA will remain  in effect (meaning this test can be used) for the duration of the COVID-19 declaration under Section 564(b)(1) of the Act, 21 U.S.C.section 360bbb-3(b)(1), unless the authorization is terminated  or revoked sooner.       Influenza A by PCR NEGATIVE NEGATIVE Final   Influenza B by PCR NEGATIVE NEGATIVE Final    Comment: (NOTE) The Xpert Xpress SARS-CoV-2/FLU/RSV plus assay is intended as an aid in the diagnosis of influenza from Nasopharyngeal  swab specimens and should not be used as a sole basis for treatment. Nasal washings and aspirates are unacceptable for Xpert Xpress SARS-CoV-2/FLU/RSV testing.  Fact Sheet for  Patients: EntrepreneurPulse.com.au  Fact Sheet for Healthcare Providers: IncredibleEmployment.be  This test is not yet approved or cleared by the [place] FDA and has been authorized for detection and/or diagnosis of SARS-CoV-2 by FDA under an Emergency Use Authorization (EUA). This EUA will remain in effect (meaning this test can be used) for the duration of the COVID-19 declaration under Section 564(b)(1) of the Act, 21 U.S.C. section 360bbb-3(b)(1), unless the authorization is terminated or revoked.  Performed at [facility], [place] [address], [place], [place] [address]       Radiology Studies: CT Head Wo Contrast  Result Date: 02/15/2021 CLINICAL DATA:  Altered mental status EXAM: CT HEAD WITHOUT CONTRAST TECHNIQUE: Contiguous axial images were obtained from the base of the skull through the vertex without intravenous contrast. COMPARISON:  April 11, 2019 head CT; brain MRI July 02, 2019 FINDINGS: Brain: Mild to moderate diffuse atrophy is stable. There is no appreciable intracranial mass, hemorrhage, extra-axial fluid collection, or midline shift. There is evidence of a prior infarct involving the superomedial aspect of the right occipital lobe, also present on prior studies. There are prior infarcts involving portions of the head of the caudate nucleus on the right as well as the adjacent anterior limb of the right internal capsule. There is evidence of a prior infarct in the left anterior lentiform nucleus with involvement of a portion of the anterior limb of the left internal capsule. There is evidence of a prior infarct involving the mid anterior right frontal lobe. There is small vessel disease in the centra semiovale bilaterally. There is evidence of a prior  infarct in the lateral mid to upper left cerebellar hemisphere. No acute appearing infarct evident. Vascular: No hyperdense vessel. There is calcification in each carotid siphon region. Skull: The bony calvarium appears intact. Sinuses/Orbits: There is mucosal thickening in several ethmoid air cells. Visualized orbits appear symmetric bilaterally. Other: Mastoid air cells are clear. IMPRESSION: Atrophy with multiple prior infarcts, largest in the right occipital lobe. No acute infarct. No mass or hemorrhage. There are foci of arterial vascular calcification. There is mucosal thickening in several ethmoid air cells. Electronically Signed   By: Lowella Grip III M.D.   On: 02/15/2021 17:09   CT Chest Wo Contrast  Result Date: 02/15/2021 CLINICAL DATA:  70 year old male with pneumonia. EXAM: CT CHEST WITHOUT CONTRAST TECHNIQUE: Multidetector CT imaging of the chest was performed following the standard protocol without IV contrast. COMPARISON:  Chest radiograph dated 02/15/2021. FINDINGS: Evaluation of this exam is limited in the absence of intravenous contrast. Cardiovascular: Top-normal cardiac size. Trace pericardial effusion. There is mild atherosclerotic calcification of the aortic arch. The central pulmonary arteries are grossly unremarkable on this noncontrast CT. Mediastinum/Nodes: No hilar or mediastinal adenopathy. Evaluation however is limited in the absence of intravenous contrast and secondary to respiratory motion. The esophagus and the thyroid gland are grossly unremarkable. No mediastinal fluid collection. Lungs/Pleura: Faint ground-glass density in the left upper lobe may represent atelectasis but concerning for developing infiltrate. Clinical correlation recommended. No lobar consolidation, pleural effusion, or pneumothorax. The central airways are patent. Mucus secretions noted in the left lower lobe bronchus. Aspiration is not excluded. Upper Abdomen: No acute abnormality. Musculoskeletal:  Degenerative changes of the spine. No acute osseous pathology. IMPRESSION: 1. Faint ground-glass density in the left upper lobe concerning for developing infiltrate. Clinical correlation recommended. 2. Mucus secretions in the left lower lobe bronchus. Aspiration is not excluded. 3. Aortic Atherosclerosis (ICD10-I70.0). Electronically Signed   By: Milas Hock  Radparvar M.D.   On: 02/15/2021 17:13   DG Chest Port 1 View  Result Date: 02/15/2021 CLINICAL DATA:  Left-sided weakness. EXAM: PORTABLE CHEST 1 VIEW COMPARISON:  PA and lateral chest 10/09/2020. FINDINGS: The patient is rotated on the exam. Lungs are clear. Heart size is normal. Aortic atherosclerosis. No pneumothorax or pleural fluid. No acute or focal bony abnormality. IMPRESSION: No acute disease. Aortic Atherosclerosis (ICD10-I70.0). Electronically Signed   By: Inge Rise M.D.   On: 02/15/2021 15:24      Scheduled Meds:  enoxaparin (LOVENOX) injection  40 mg Subcutaneous Q24H   insulin aspart  0-9 Units Subcutaneous Q4H   Continuous Infusions:  sodium chloride     azithromycin Stopped (02/15/21 2221)   cefTRIAXone (ROCEPHIN)  IV 2 g (02/16/21 0504)   lactated ringers 100 mL/hr at 02/15/21 2057     LOS: 1 day      Time spent: 25 minutes   Dessa Phi, DO [facility] 02/16/2021, 1:50 PM   Available via Epic secure chat 7am-7pm After these hours, please refer to coverage provider listed on [email or web address]

## 2021-02-16 NOTE — Evaluation (Signed)
Clinical/Bedside Swallow Evaluation Patient Details  Name: Joe SIMIEN Sr. 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 ([name])    Diabetes mellitus without complication ([place])    Dysphagia    post stroke   Hypertension    Stroke [facility])    Past Surgical History:  Past Surgical History:  Procedure Laterality Date   IR ANGIO INTRA EXTRACRAN SEL COM CAROTID INNOMINATE UNI L MOD SED  03/10/2019   IR ANGIO VERTEBRAL SEL SUBCLAVIAN INNOMINATE BILAT MOD SED  03/10/2019   IR CT HEAD LTD  03/10/2019   IR PERCUTANEOUS ART THROMBECTOMY/INFUSION INTRACRANIAL INC DIAG ANGIO  03/10/2019   RADIOLOGY WITH ANESTHESIA N/A 03/10/2019   Procedure: IR WITH ANESTHESIA/CODE STROKE;  Surgeon: Radiologist, Medication, MD;  Location: [place];  Service: Radiology;  Laterality: N/A;   HPI:  70 y.o. male admitted 6/14 with sepsis, pna, AMS. PMHx significant for right CVA 2020, DM2, HTN.  Hx of dysphagia after stroke with general improvements over time. Currently participating in OP speech/cognitive therapy.  OP MBS 10/21/20: oral holding, lingual rocking, piecemeal deglutition, high penetration (PAS 2) of thin liquids (considered WNL) and no aspiration. Regular diet and thin liquids were recommended.  It was noted that pt would be susceptible to acute deterioration of swallowing during periods of medical illness.   Assessment / Plan / Recommendation Clinical Impression  Pt lethargic. Presents with acute-on-chronic but usually mild dysphagia, now exacerbated by AMS.  Notable for poor attention, prolonged oral mastication with lip pumping, and likely  delayed swallow.  Pt coughed after initial consumption of thin liquids, but there were no further incidents of coughing with subsequent swallows.  He followed simple commands; stated name and basic biographical information with low vocal volume; required constant tactile and verbal prompts to stay awake. Recommend starting a dysphagia 1 with thin liquids for today.  Provide supervision to assist pt with feeding himself and ensure safety. SLP will follow for safety and diet progression.  D/W RN.      Aspiration Risk  Mild aspiration risk    Diet Recommendation   Dysphagia 1, thin liquids  Medication Administration: Whole meds with puree    Other  Recommendations Oral Care Recommendations: Oral care BID   Follow up Recommendations Other (comment) (tba)      Frequency and Duration min 2x/week  1 week       Prognosis Prognosis for Safe Diet Advancement: Good Barriers to Reach Goals: Cognitive deficits      Swallow Study   General Date of Onset: 02/15/21 HPI: 70 y.o. male admitted 6/14 with sepsis, pna, AMS. PMHx significant for right CVA 2020, DM2, HTN.  Hx of dysphagia after stroke with general improvements over time. Currently participating in OP speech/cognitive therapy.  OP MBS 10/21/20: oral holding, lingual rocking, piecemeal deglutition, high penetration (PAS 2) of thin liquids (considered WNL) and no aspiration. Regular diet and thin liquids were recommended.  It was noted that pt would be susceptible to acute deterioration of swallowing during periods of medical illness. Type of Study: Bedside Swallow Evaluation Previous Swallow Assessment: see HPI Diet Prior to this Study: NPO Temperature Spikes Noted: No Respiratory Status:  Room air History of Recent Intubation: No Behavior/Cognition: Lethargic/Drowsy Oral Cavity Assessment: Within Functional Limits Oral Care Completed by SLP: No Oral Cavity - Dentition: Adequate natural dentition Self-Feeding Abilities: Needs  assist Patient Positioning: Upright in bed Baseline Vocal Quality: Low vocal intensity Volitional Cough: Weak Volitional Swallow: Able to elicit    Oral/Motor/Sensory Function     Ice Chips Ice chips: Within functional limits   Thin Liquid Thin Liquid: Impaired Presentation: Cup;Straw Oral Phase Impairments: Reduced lingual movement/coordination Oral Phase Functional Implications: Prolonged oral transit Pharyngeal  Phase Impairments: Other (comments) (coughed after initial bolus; no further coughing)    Nectar Thick Nectar Thick Liquid: Not tested   Honey Thick Honey Thick Liquid: Not tested   Puree Puree: Within functional limits   Solid     Solid: Not tested     Estill Bamberg L. Tivis Ringer, [place] Office number [phone] Pager [phone]  Joe Perry 02/16/2021,10:53 AM

## 2021-02-17 ENCOUNTER — Ambulatory Visit: Payer: Medicare Other | Admitting: Occupational Therapy

## 2021-02-17 ENCOUNTER — Ambulatory Visit: Payer: Medicare Other | Admitting: Speech Pathology

## 2021-02-17 DIAGNOSIS — J189 Pneumonia, unspecified organism: Secondary | ICD-10-CM | POA: Diagnosis not present

## 2021-02-17 LAB — URINE CULTURE

## 2021-02-17 LAB — CBC
HCT: 37.5 % — ABNORMAL LOW (ref 39.0–52.0)
Hemoglobin: 12.3 g/dL — ABNORMAL LOW (ref 13.0–17.0)
MCH: 30.5 pg (ref 26.0–34.0)
MCHC: 32.8 g/dL (ref 30.0–36.0)
MCV: 93.1 fL (ref 80.0–100.0)
Platelets: 188 10*3/uL (ref 150–400)
RBC: 4.03 MIL/uL — ABNORMAL LOW (ref 4.22–5.81)
RDW: 12 % (ref 11.5–15.5)
WBC: 5.3 10*3/uL (ref 4.0–10.5)
nRBC: 0 % (ref 0.0–0.2)

## 2021-02-17 LAB — BASIC METABOLIC PANEL
Anion gap: 8 (ref 5–15)
BUN: 9 mg/dL (ref 8–23)
CO2: 29 mmol/L (ref 22–32)
Calcium: 9.2 mg/dL (ref 8.9–10.3)
Chloride: 103 mmol/L (ref 98–111)
Creatinine, Ser: 0.68 mg/dL (ref 0.61–1.24)
GFR, Estimated: >60 mL/min (ref >60)
Glucose, Bld: 130 mg/dL — ABNORMAL HIGH (ref 70–99)
Potassium: 3.5 mmol/L (ref 3.5–5.1)
Sodium: 140 mmol/L (ref 135–145)

## 2021-02-17 LAB — GLUCOSE, CAPILLARY
Glucose-Capillary: 118 mg/dL — ABNORMAL HIGH (ref 70–99)
Glucose-Capillary: 117 mg/dL — ABNORMAL HIGH (ref 70–99)

## 2021-02-17 MED ORDER — AMOXICILLIN-POT CLAVULANATE 875-125 MG PO TABS: 1.0000 | ORAL_TABLET | Freq: Two times a day (BID) | ORAL | 0 refills | Status: AC

## 2021-02-17 NOTE — TOC Transition Note (Signed)
Transition of Care [facility]) - CM/SW Discharge Note   Patient Details  Name: Joe BONG Sr. MRN: [id number] Date of Birth: 08/05/1951  [facility]) CM/SW Contact:  Zenon Mayo, RN Phone Number: 02/17/2021, 1:59 PM   Clinical Narrative:    Patient is for dc today, wife is at the bedside, she sates he sees PCP Joe Perry, a follow up has been scheduled for him.  He will resume outpatient therapies at the [place]. He has no other needs.   Final next level of care: OP Rehab Barriers to Discharge: No Barriers Identified   Patient Goals and CMS Choice Patient states their goals for this hospitalization and ongoing recovery are:: return home   Choice offered to / list presented to : NA  Discharge Placement                       Discharge Plan and Services                  DME Agency: NA                  Social Determinants of Health (SDOH) Interventions     Readmission Risk Interventions No flowsheet data found.

## 2021-02-17 NOTE — Evaluation (Signed)
Physical Therapy Evaluation Patient Details Name: Joe ZARCONE Sr. MRN: [id number] DOB: 1951-04-11 Today's Date: 02/17/2021   History of Present Illness  Pt is a 70 y/o male presenting to the ED on 02/15/2021 due to worsening L sided weakness. CT chest reveals early LUL PNA and suspicion for aspiration in LLL. PMH significant of of prior strokes, DM2, HTN.  Dysphagia, cognitive deficits, and aphasia, and hemiparesis post stroke.  Clinical Impression  Pt demonstrates deficits in functional mobility, gait, strength, endurance, and activity tolerance. Pt tolerates bed mobility and transfers, requiring physical assistance to perform and cues to initiate. Pt ambulates short household distances with RW, with occasional collision into stationary obstacles in the hospital room. Pt will benefit from acute PT services to improve independence in mobility and aid in return to prior level. Spouse reports preference for HHPT versus SNF placement, SPT recommends HHPT and 24 hour supervision/assistance.     Follow Up Recommendations Home health PT;Supervision/Assistance - 24 hour    Equipment Recommendations  None recommended by PT    Recommendations for Other Services       Precautions / Restrictions Precautions Precautions: Fall Restrictions Weight Bearing Restrictions: No      Mobility  Bed Mobility Overal bed mobility: Needs Assistance Bed Mobility: Supine to Sit     Supine to sit: Mod assist;HOB elevated     General bed mobility comments: mod A for LEs and trunk.    Transfers Overall transfer level: Needs assistance Equipment used: Rolling walker (2 wheeled) Transfers: Stand Pivot Transfers;Sit to/from Stand Sit to Stand: Min guard;Min assist (x 3 trials. min A for inital transfer and min G for remaining 2 trials.) Stand pivot transfers: Mod assist       General transfer comment: Pt requires min A for inital sit>stand transfer and min G for remaining attempts. Pt requires mod A  for stand pivot transfer to take steps back towards recliner. Stand pivot performed without RW.  Ambulation/Gait Ambulation/Gait assistance: Min guard;Min assist Gait Distance (Feet): 35 Feet Assistive device: Rolling walker (2 wheeled) Gait Pattern/deviations: Step-to pattern;Step-through pattern;Decreased dorsiflexion - left;Shuffle;Trunk flexed;Decreased stride length Gait velocity: reduced Gait velocity interpretation: <1.31 ft/sec, indicative of household ambulator General Gait Details: slow, midly unstable gait with occasional collision with RW into obstacles in room. Pt LLE hyperextended at knee and posture offset to R side of RW.  Stairs            Wheelchair Mobility    Modified Rankin (Stroke Patients Only)       Balance Overall balance assessment: Needs assistance Sitting-balance support: Feet supported;Single extremity supported;Bilateral upper extremity supported Sitting balance-[name]: Poor     Standing balance support: During functional activity Standing balance-[name]: Poor Standing balance comment: Pt reliant on UE support and external support.                             Pertinent Vitals/Pain Pain Assessment: No/denies pain    Home Living Family/patient expects to be discharged to:: Private residence Living Arrangements: Spouse/significant other Available Help at Discharge: Family;Available 24 hours/day Type of Home: House Home Access: Stairs to enter Entrance Stairs-Rails: Can reach both Entrance Stairs-Number of Steps: 2 Home Layout: Multi-level;Able to live on main level with bedroom/bathroom Home Equipment: Gilford Rile - 2 wheels;Wheelchair - manual;Shower seat;Bedside commode      Prior Function Level of Independence: Needs assistance   Gait / Transfers Assistance Needed: Pt mod I for gait and transfers  at baseline, with RW.  ADL's / Homemaking Assistance Needed: Pt requires assistance for dressing and bathing. Per spouse,  pt able to feed himself.        Hand Dominance        Extremity/Trunk Assessment   Upper Extremity Assessment Upper Extremity Assessment: Defer to OT evaluation (Pt with decorticate positioning of L UE at rest.)    Lower Extremity Assessment Lower Extremity Assessment: Generalized weakness;LLE deficits/detail LLE Deficits / Details: Pt with baseline LLE strength deficits secondary to past stroke. Adductor tone of L LE noted during bed mobility and peri care.    Cervical / Trunk Assessment Cervical / Trunk Assessment: Kyphotic  Communication   Communication: Other (comment);Expressive difficulties (Pt soft spoken.)  Cognition Arousal/Alertness: Awake/alert Behavior During Therapy: Flat affect Overall Cognitive Status: History of cognitive impairments - at baseline                                 General Comments: cognitive deficits at baseline. Pt inconsistently follows one-step commands with increased time. Pt requires tactile and verbal cues during session.      General Comments General comments (skin integrity, edema, etc.): Pt VSS on RA.    Exercises     Assessment/Plan    PT Assessment Patient needs continued PT services  PT Problem List Decreased strength;Decreased activity tolerance;Decreased balance;Decreased mobility;Decreased knowledge of use of DME;Decreased safety awareness;Decreased knowledge of precautions       PT Treatment Interventions DME instruction;Stair training;Gait training;Functional mobility training;Therapeutic activities;Therapeutic exercise;Balance training;Patient/family education    PT Goals (Current goals can be found in the Care Plan section)  Acute Rehab PT Goals Patient Stated Goal: go home PT Goal Formulation: With patient Time For Goal Achievement: 03/03/21 Potential to Achieve Goals: Good    Frequency Min 3X/week   Barriers to discharge        Co-evaluation               AM-PAC PT "6 Clicks"  Mobility  Outcome Measure Help needed turning from your back to your side while in a flat bed without using bedrails?: A Little Help needed moving from lying on your back to sitting on the side of a flat bed without using bedrails?: A Lot Help needed moving to and from a bed to a chair (including a wheelchair)?: A Little Help needed standing up from a chair using your arms (e.g., wheelchair or bedside chair)?: A Little Help needed to walk in hospital room?: A Little Help needed climbing 3-5 steps with a railing? : A Lot 6 Click Score: 16    End of Session Equipment Utilized During Treatment: Gait belt Activity Tolerance: Patient tolerated treatment well Patient left: in chair;with call bell/phone within reach;with chair alarm set Nurse Communication: Mobility status PT Visit Diagnosis: Unsteadiness on feet (R26.81);Other abnormalities of gait and mobility (R26.89);Muscle weakness (generalized) (M62.81)    Time: 0910-1006 PT Time Calculation (min) (ACUTE ONLY): 56 min   Charges:   PT Evaluation $PT Eval Low Complexity: 1 Low PT Treatments $Therapeutic Activity: 8-22 mins        Acute Rehab  Pager: [phone]   Garwin Brothers, SPT  02/17/2021, 1:35 PM

## 2021-02-17 NOTE — Plan of Care (Signed)
  Problem: Clinical Measurements: Goal: Respiratory complications will improve Outcome: Progressing Goal: Cardiovascular complication will be avoided Outcome: Progressing   

## 2021-02-17 NOTE — Plan of Care (Signed)
  Problem: SLP Dysphagia Goals Goal: Patient will utilize recommended strategies Description: Patient will utilize recommended strategies during swallow to increase swallowing safety with Outcome: Adequate for Discharge   Problem: Education: Goal: Knowledge of General Education information will improve Description: Including pain rating scale, medication(s)/side effects and non-pharmacologic comfort measures Outcome: Adequate for Discharge   Problem: Health Behavior/Discharge Planning: Goal: Ability to manage health-related needs will improve Outcome: Adequate for Discharge   Problem: Clinical Measurements: Goal: Ability to maintain clinical measurements within normal limits will improve Outcome: Adequate for Discharge Goal: Will remain free from infection Outcome: Adequate for Discharge Goal: Diagnostic test results will improve Outcome: Adequate for Discharge Goal: Respiratory complications will improve Outcome: Adequate for Discharge Goal: Cardiovascular complication will be avoided Outcome: Adequate for Discharge

## 2021-02-17 NOTE — Progress Notes (Signed)
D/C instructions given and reviewed. Tele and IV's removed, tolerated well. Family at bedside to transport home.

## 2021-02-17 NOTE — Progress Notes (Signed)
  Speech Language Pathology Treatment: Dysphagia  Patient Details Name: Joe HOMAN Sr. MRN: [id number] DOB: 09-25-1950 Today's Date: 02/17/2021 Time: 1216-1227 SLP Time Calculation (min) (ACUTE ONLY): 11 min  Assessment / Plan / Recommendation Clinical Impression  Pt much more alert today.  Wife at bedside.  He was self-feeding lunch independently without difficulty.  Regular solid trials offered with no overt difficulty - there was mildly prolonged mastication, but it was functional.  No s/s of aspiration with thin liquids and mixed solid/liquid consistencies.  Recommend advancing diet back to regular solids; thin liquids.  No SLP f/u for swallowing is needed. Recommend resuming OP SLP at neuroOP for cognition/language.  D/W wife who agrees.    HPI HPI: 70 y.o. male admitted 6/14 with sepsis, pna, AMS. PMHx significant for right CVA 2020, DM2, HTN.  Hx of dysphagia after stroke with general improvements over time. Currently participating in OP speech/cognitive therapy.  OP MBS 10/21/20: oral holding, lingual rocking, piecemeal deglutition, high penetration (PAS 2) of thin liquids (considered WNL) and no aspiration. Regular diet and thin liquids were recommended.  It was noted that pt would be susceptible to acute deterioration of swallowing during periods of medical illness.      SLP Plan  All goals met       Recommendations  Diet recommendations: Regular;Thin liquid Liquids provided via: Cup;Straw Medication Administration: Whole meds with puree Supervision: Patient able to self feed Compensations: Minimize environmental distractions                Oral Care Recommendations: Oral care BID Follow up Recommendations: Other (comment) (resume OP SLP for cognition) SLP Visit Diagnosis: Dysphagia, unspecified (R13.10) Plan: All goals met       GO              Joe Perry, Joe Perry CCC/SLP Acute Rehabilitation Services Office number [phone] Pager  [phone]   Joe Perry 02/17/2021, 2:02 PM

## 2021-02-17 NOTE — Evaluation (Signed)
Occupational Therapy Evaluation Patient Details Name: Joe FRASIER Sr. MRN: [id number] DOB: September 16, 1950 Today's Date: 02/17/2021    History of Present Illness Pt is a 70 y/o male presenting to the ED on 02/15/2021 due to worsening L sided weakness. CT chest reveals early LUL PNA and suspicion for aspiration in LLL. PMH significant of of prior strokes, DM2, HTN.  Dysphagia, cognitive deficits, and aphasia, and hemiparesis post stroke.   Clinical Impression   Pt was assisted for all ADL, but wife encourages him to participate as much as possible. He was walking with  RW on his own. Pt was receiving OP therapy. He presents with baseline L hemiparesis, impaired cognition and imbalance with ambulation. He requires up to moderate assistance for ADL and min assist for mobility with RW. Wife participated in session and stated she can manage pt at home and want to resume OP therapy at [facility]. Pt to d/c today, will defer further OT to OP.     Follow Up Recommendations  Outpatient OT (resume)    Equipment Recommendations  Tub/shower seat (please ask wife what type of shower seat he needs)    Recommendations for Other Services       Precautions / Restrictions Precautions Precautions: Fall      Mobility Bed Mobility               General bed mobility comments: pt in chair    Transfers Overall transfer level: Needs assistance Equipment used: Rolling walker (2 wheeled) Transfers: Sit to/from Stand Sit to Stand: Min guard         General transfer comment: cues for technique    Balance Overall balance assessment: Needs assistance   Sitting balance-[name]: Fair     Standing balance support: During functional activity Standing balance-[name]: Poor Standing balance comment: Pt reliant on UE support and external support.                           ADL either performed or assessed with clinical judgement   ADL Overall ADL's : Needs  assistance/impaired Eating/Feeding: Minimal assistance;Sitting   Grooming: Wash/dry face;Sitting;Set up       Lower Body Bathing: Minimal assistance;Sit to/from stand   Upper Body Dressing : Minimal assistance;Sitting   Lower Body Dressing: Moderate assistance;Sitting/lateral leans   Toilet Transfer: Minimal assistance;Ambulation;RW           Functional mobility during ADLs: Minimal assistance;Rolling walker       Vision         Perception     Praxis      Pertinent Vitals/Pain Pain Assessment: No/denies pain     Hand Dominance Right   Extremity/Trunk Assessment Upper Extremity Assessment Upper Extremity Assessment: LUE deficits/detail LUE Deficits / Details: tremulous, incoodination from prior CVA LUE Sensation: decreased proprioception LUE Coordination: decreased fine motor;decreased gross motor   Lower Extremity Assessment Lower Extremity Assessment: Defer to PT evaluation   Cervical / Trunk Assessment Cervical / Trunk Assessment: Kyphotic   Communication Communication Communication: Expressive difficulties   Cognition Arousal/Alertness: Awake/alert Behavior During Therapy: Flat affect Overall Cognitive Status: History of cognitive impairments - at baseline                                     General Comments       Exercises     Shoulder Instructions  Home Living Family/patient expects to be discharged to:: Private residence Living Arrangements: Spouse/significant other Available Help at Discharge: Family;Available 24 hours/day Type of Home: House Home Access: Stairs to enter [employer] of Steps: 2 Entrance Stairs-Rails: Can reach both Home Layout: Multi-level;Able to live on main level with bedroom/bathroom     Bathroom Shower/Tub: Teacher, early years/pre: Standard     Home Equipment: Environmental consultant - 2 wheels;Wheelchair - manual;Shower seat;Bedside commode   Additional Comments: wife states he  needs another shower seat      Prior Functioning/Environment Level of Independence: Needs assistance  Gait / Transfers Assistance Needed: Pt mod I for gait and transfers at baseline, with RW. ADL's / Homemaking Assistance Needed: Pt requires assistance for dressing and bathing. Per spouse, pt able to feed himself.            OT Problem List: Impaired balance (sitting and/or standing);Decreased coordination;Decreased knowledge of use of DME or AE;Impaired UE functional use      OT Treatment/Interventions: Self-care/ADL training    OT Goals(Current goals can be found in the care plan section) Acute Rehab OT Goals Patient Stated Goal: go home  OT Frequency: Min 2X/week   Barriers to D/C:            Co-evaluation              AM-PAC OT "6 Clicks" Daily Activity     Outcome Measure Help from another person eating meals?: A Little Help from another person taking care of personal grooming?: A Little Help from another person toileting, which includes using toliet, bedpan, or urinal?: A Lot Help from another person bathing (including washing, rinsing, drying)?: A Lot Help from another person to put on and taking off regular upper body clothing?: A Little Help from another person to put on and taking off regular lower body clothing?: A Lot 6 Click Score: 15   End of Session Equipment Utilized During Treatment: Rolling walker;Gait belt Nurse Communication: Other (comment) (needs new condom cath)  Activity Tolerance: Patient tolerated treatment well Patient left: in chair;with call bell/phone within reach;with chair alarm set;with family/visitor present  OT Visit Diagnosis: Unsteadiness on feet (R26.81);Other abnormalities of gait and mobility (R26.89);Hemiplegia and hemiparesis Hemiplegia - Right/Left: Left Hemiplegia - dominant/non-dominant: Non-Dominant Hemiplegia - caused by: Cerebral infarction                Time: 7517-0017 OT Time Calculation (min): 33 min Charges:   OT General Charges $OT Visit: 1 Visit OT Evaluation $OT Eval Moderate Complexity: 1 Mod OT Treatments $Self Care/Home Management : 8-22 mins  Nestor Lewandowsky, OTR/L Acute Rehabilitation Services Pager: [phone] Office: [phone]  Malka So 02/17/2021, 2:24 PM

## 2021-02-17 NOTE — Discharge Summary (Signed)
Physician Discharge Summary  [place] GBT:[id number] DOB: [birth year] DOA: 02/15/2021  PCP: Elsie Stain, MD  Admit date: 02/15/2021 Discharge date: 02/17/2021  Admitted From: Home Disposition: Home  Recommendations for Outpatient Follow-up:  Follow up with PCP in 1 week  Discharge Condition: Stable CODE STATUS: Full code Diet recommendation: Heart healthy diet  Brief/Interim Summary: Joe Perry Sr. is a 70 year old male with past medical history significant for previous strokes with residual aphasia and hemiparesis, diabetes mellitus, hypertension, dysphagia, cognitive deficits.  Apparently his aphasia gets worse whenever he gets ill with an infection.  Per family, EMS was called due to worsening left-sided weakness 4 days ago, patient developed a fever day before admission.  EMS evaluated patient, but patient was not brought to the ED at that time.  Patient now presenting with fever, work-up revealed suspicion for pneumonia in the left upper and left lower lobe.  Treated with empiric Rocephin, azithromycin.  Sepsis continue to improve.  Patient was evaluated by speech-language pathology, placed on dysphagia diet.  He remained fever free, on room air and was discharged home on oral antibiotics.  Discharge Diagnoses:  Principal Problem:   Left upper lobe pneumonia Active Problems:   Dysphagia due to recent stroke   Controlled type 2 diabetes mellitus with hyperglycemia, without long-term current use of insulin ([name])   Cognitive deficit, post-stroke   Benign essential HTN   Left hemiparesis ([name])   Acute metabolic encephalopathy   Sepsis secondary to CAP, with concern for aspiration pneumonia -Presented with fever of 101.5, tachycardia 104 -Blood cultures negative to date -Continue Rocephin, azithromycin -SLP evaluation recommended dysphagia diet   Acute metabolic encephalopathy with underlying dementia -Secondary to above infection -Improved -Continue home  Seroquel, Aricept  Diabetes mellitus type 2 -Continue SSI   Hypertension -Continue Norvasc   History of stroke with residual left-sided hemiparesis, cognitive deficit, dysphagia, aphasia -PT OT recommended home health -Continue aspirin, Lipitor    Discharge Instructions  Discharge Instructions     Call MD for:  difficulty breathing, headache or visual disturbances   Complete by: As directed    Call MD for:  extreme fatigue   Complete by: As directed    Call MD for:  persistant dizziness or light-headedness   Complete by: As directed    Call MD for:  persistant nausea and vomiting   Complete by: As directed    Call MD for:  severe uncontrolled pain   Complete by: As directed    Call MD for:  temperature >100.4   Complete by: As directed    Discharge instructions   Complete by: As directed    You were cared for by a hospitalist during your hospital stay. If you have any questions about your discharge medications or the care you received while you were in the hospital after you are discharged, you can call the unit and ask to speak with the hospitalist on call if the hospitalist that took care of you is not available. Once you are discharged, your primary care physician will handle any further medical issues. Please note that NO REFILLS for any discharge medications will be authorized once you are discharged, as it is imperative that you return to your primary care physician (or establish a relationship with a primary care physician if you do not have one) for your aftercare needs so that they can reassess your need for medications and monitor your lab values.   Increase activity slowly   Complete by: As directed  Allergies as of 02/17/2021   No Known Allergies      Medication List     TAKE these medications    acetaminophen 325 MG tablet Commonly known as: TYLENOL Take 2 tablets (650 mg total) by mouth every 4 (four) hours as needed for mild pain (or temp > 37.5 C  (99.5 F)).   amLODipine 5 MG tablet Commonly known as: NORVASC Take 1 tablet (5 mg total) by mouth daily. To lower blood pressure   amoxicillin-clavulanate 875-125 MG tablet Commonly known as: Augmentin Take 1 tablet by mouth 2 (two) times daily for 5 days.   aspirin EC 81 MG tablet Take 1 tablet (81 mg total) by mouth daily.   atorvastatin 40 MG tablet Commonly known as: LIPITOR One daily in the evening to lower cholesterol What changed:  how much to take how to take this when to take this   cetirizine 10 MG tablet Commonly known as: ZYRTEC Take 1 tablet (10 mg total) by mouth daily.   donepezil 10 MG tablet Commonly known as: ARICEPT TAKE 1 TABLET BY MOUTH AT BEDTIME   metFORMIN 500 MG tablet Commonly known as: GLUCOPHAGE Take 1 tablet (500 mg total) by mouth 2 (two) times daily with a meal.   QUEtiapine 25 MG tablet Commonly known as: SEROQUEL Take 2 tablets (50 mg total) by mouth at bedtime.        Follow-up Information     Elsie Stain, MD. Schedule an appointment as soon as possible for a visit in 1 week(s).   Specialty: Pulmonary Disease Contact information: [address] [address] [phone]                No Known Allergies    Procedures/Studies: CT Head Wo Contrast  Result Date: 02/15/2021 CLINICAL DATA:  Altered mental status EXAM: CT HEAD WITHOUT CONTRAST TECHNIQUE: Contiguous axial images were obtained from the base of the skull through the vertex without intravenous contrast. COMPARISON:  April 11, 2019 head CT; brain MRI July 02, 2019 FINDINGS: Brain: Mild to moderate diffuse atrophy is stable. There is no appreciable intracranial mass, hemorrhage, extra-axial fluid collection, or midline shift. There is evidence of a prior infarct involving the superomedial aspect of the right occipital lobe, also present on prior studies. There are prior infarcts involving portions of the head of the caudate nucleus on the  right as well as the adjacent anterior limb of the right internal capsule. There is evidence of a prior infarct in the left anterior lentiform nucleus with involvement of a portion of the anterior limb of the left internal capsule. There is evidence of a prior infarct involving the mid anterior right frontal lobe. There is small vessel disease in the centra semiovale bilaterally. There is evidence of a prior infarct in the lateral mid to upper left cerebellar hemisphere. No acute appearing infarct evident. Vascular: No hyperdense vessel. There is calcification in each carotid siphon region. Skull: The bony calvarium appears intact. Sinuses/Orbits: There is mucosal thickening in several ethmoid air cells. Visualized orbits appear symmetric bilaterally. Other: Mastoid air cells are clear. IMPRESSION: Atrophy with multiple prior infarcts, largest in the right occipital lobe. No acute infarct. No mass or hemorrhage. There are foci of arterial vascular calcification. There is mucosal thickening in several ethmoid air cells. Electronically Signed   By: Lowella Grip III M.D.   On: 02/15/2021 17:09   CT Chest Wo Contrast  Result Date: 02/15/2021 CLINICAL DATA:  70 year old male with pneumonia.  EXAM: CT CHEST WITHOUT CONTRAST TECHNIQUE: Multidetector CT imaging of the chest was performed following the standard protocol without IV contrast. COMPARISON:  Chest radiograph dated 02/15/2021. FINDINGS: Evaluation of this exam is limited in the absence of intravenous contrast. Cardiovascular: Top-normal cardiac size. Trace pericardial effusion. There is mild atherosclerotic calcification of the aortic arch. The central pulmonary arteries are grossly unremarkable on this noncontrast CT. Mediastinum/Nodes: No hilar or mediastinal adenopathy. Evaluation however is limited in the absence of intravenous contrast and secondary to respiratory motion. The esophagus and the thyroid gland are grossly unremarkable. No mediastinal  fluid collection. Lungs/Pleura: Faint ground-glass density in the left upper lobe may represent atelectasis but concerning for developing infiltrate. Clinical correlation recommended. No lobar consolidation, pleural effusion, or pneumothorax. The central airways are patent. Mucus secretions noted in the left lower lobe bronchus. Aspiration is not excluded. Upper Abdomen: No acute abnormality. Musculoskeletal: Degenerative changes of the spine. No acute osseous pathology. IMPRESSION: 1. Faint ground-glass density in the left upper lobe concerning for developing infiltrate. Clinical correlation recommended. 2. Mucus secretions in the left lower lobe bronchus. Aspiration is not excluded. 3. Aortic Atherosclerosis (ICD10-I70.0). Electronically Signed   By: Anner Crete M.D.   On: 02/15/2021 17:13   DG Chest Port 1 View  Result Date: 02/15/2021 CLINICAL DATA:  Left-sided weakness. EXAM: PORTABLE CHEST 1 VIEW COMPARISON:  PA and lateral chest 10/09/2020. FINDINGS: The patient is rotated on the exam. Lungs are clear. Heart size is normal. Aortic atherosclerosis. No pneumothorax or pleural fluid. No acute or focal bony abnormality. IMPRESSION: No acute disease. Aortic Atherosclerosis (ICD10-I70.0). Electronically Signed   By: Inge Rise M.D.   On: 02/15/2021 15:24       Discharge Exam: Vitals:   02/17/21 0851 02/17/21 1135  BP: 137/75 (!) 148/44  Pulse:  100  Resp: 16 18  Temp:  97.6 F (36.4 C)  SpO2: 100% 94%     General: Pt is alert, awake, not in acute distress Cardiovascular: RRR, S1/S2 +, no edema Respiratory: CTA bilaterally, no wheezing, no rhonchi, no respiratory distress, no conversational dyspnea, on room air  Abdominal: Soft, NT, ND, bowel sounds + Extremities: no edema, no cyanosis Psych: Normal mood and affect   The results of significant diagnostics from this hospitalization (including imaging, microbiology, ancillary and laboratory) are listed below for reference.      Microbiology: Recent Results (from the past 240 hour(s))  Blood Culture (routine x 2)     Status: None (Preliminary result)   Collection Time: 02/15/21  3:14 PM   Specimen: BLOOD  Result Value Ref Range Status   Specimen Description BLOOD SITE NOT SPECIFIED  Final   Special Requests   Final    BOTTLES DRAWN AEROBIC AND ANAEROBIC Blood Culture results may not be optimal due to an inadequate volume of blood received in culture bottles   Culture   Final    NO GROWTH 2 DAYS Performed at [facility], [place] [address] [place], [place] [address]    Report Status PENDING  Incomplete  Resp Panel by RT-PCR (Flu A&B, Covid) Nasopharyngeal Swab     Status: None   Collection Time: 02/15/21  4:25 PM   Specimen: Nasopharyngeal Swab; Nasopharyngeal(NP) swabs in vial transport medium  Result Value Ref Range Status   SARS Coronavirus 2 by RT PCR NEGATIVE NEGATIVE Final    Comment: (NOTE) SARS-CoV-2 target nucleic acids are NOT DETECTED.  The SARS-CoV-2 RNA is generally detectable in upper respiratory specimens during the acute  phase of infection. The lowest concentration of SARS-CoV-2 viral copies this assay can detect is 138 copies/mL. A negative result does not preclude SARS-Cov-2 infection and should not be used as the sole basis for treatment or other patient management decisions. A negative result may occur with  improper specimen collection/handling, submission of specimen other than nasopharyngeal swab, presence of viral mutation(s) within the areas targeted by this assay, and inadequate number of viral copies(<138 copies/mL). A negative result must be combined with clinical observations, patient history, and epidemiological information. The expected result is Negative.  Fact Sheet for Patients:  EntrepreneurPulse.com.au  Fact Sheet for Healthcare Providers:  IncredibleEmployment.be  This test is no t yet approved or cleared by the [place] FDA and  has been authorized for detection and/or diagnosis of SARS-CoV-2 by FDA under an Emergency Use Authorization (EUA). This EUA will remain  in effect (meaning this test can be used) for the duration of the COVID-19 declaration under Section 564(b)(1) of the Act, 21 U.S.C.section 360bbb-3(b)(1), unless the authorization is terminated  or revoked sooner.       Influenza A by PCR NEGATIVE NEGATIVE Final   Influenza B by PCR NEGATIVE NEGATIVE Final    Comment: (NOTE) The Xpert Xpress SARS-CoV-2/FLU/RSV plus assay is intended as an aid in the diagnosis of influenza from Nasopharyngeal swab specimens and should not be used as a sole basis for treatment. Nasal washings and aspirates are unacceptable for Xpert Xpress SARS-CoV-2/FLU/RSV testing.  Fact Sheet for Patients: EntrepreneurPulse.com.au  Fact Sheet for Healthcare Providers: IncredibleEmployment.be  This test is not yet approved or cleared by the [place] FDA and has been authorized for detection and/or diagnosis of SARS-CoV-2 by FDA under an Emergency Use Authorization (EUA). This EUA will remain in effect (meaning this test can be used) for the duration of the COVID-19 declaration under Section 564(b)(1) of the Act, 21 U.S.C. section 360bbb-3(b)(1), unless the authorization is terminated or revoked.  Performed at [facility], [place] [address], [place], [place] [address]   Urine culture     Status: Abnormal   Collection Time: 02/15/21 11:16 PM   Specimen: In/Out Cath Urine  Result Value Ref Range Status   Specimen Description IN/OUT CATH URINE  Final   Special Requests   Final    NONE Performed at [facility], [place] [address] [place], [place] [address]    Culture MULTIPLE SPECIES PRESENT, SUGGEST RECOLLECTION (A)  Final   Report Status 02/17/2021 FINAL  Final  Blood Culture (routine x 2)     Status: None (Preliminary result)   Collection Time: 02/16/21  12:42 AM   Specimen: BLOOD LEFT HAND  Result Value Ref Range Status   Specimen Description BLOOD LEFT HAND  Final   Special Requests   Final    BOTTLES DRAWN AEROBIC AND ANAEROBIC Blood Culture adequate volume   Culture   Final    NO GROWTH 1 DAY Performed at [facility], [place] [address], [place], [place] [address]    Report Status PENDING  Incomplete     Labs: BNP (last 3 results) Recent Labs    03/22/20 1938 02/15/21 1220 02/15/21 1700  BNP 79.1 162.8* 469.6*   Basic Metabolic Panel: Recent Labs  Lab 02/15/21 1220 02/15/21 1632 02/16/21 0042 02/17/21 0256  NA 139 137 135 140  K 4.2 3.7 3.7 3.5  CL 101 102 99 103  CO2  --  25 26 29   GLUCOSE 175* 109* 122* 130*  BUN 15 13 10 9   CREATININE 0.86 0.83 0.82 0.68  CALCIUM 9.6 8.7* 9.2 9.2   Liver Function Tests: Recent Labs  Lab 02/15/21 1220 02/15/21 1632  AST 28 31  ALT  --  25  ALKPHOS 77 49  BILITOT 0.6 1.3*  PROT 7.2 6.4*  ALBUMIN 4.6 3.4*   No results for input(s): LIPASE, AMYLASE in the last 168 hours. No results for input(s): AMMONIA in the last 168 hours. CBC: Recent Labs  Lab 02/15/21 1220 02/15/21 1442 02/16/21 0042 02/17/21 0256  WBC 8.2 8.6 8.0 5.3  NEUTROABS 6.6 7.0  --   --   HGB 12.9* 13.5 13.0 12.3*  HCT 40.5 42.0 39.0 37.5*  MCV 95 95.5 92.9 93.1  PLT 188 197 179 188   Cardiac Enzymes: No results for input(s): CKTOTAL, CKMB, CKMBINDEX, TROPONINI in the last 168 hours. BNP: Invalid input(s): POCBNP CBG: Recent Labs  Lab 02/16/21 1135 02/16/21 1623 02/16/21 1959 02/17/21 0647 02/17/21 1136  GLUCAP 113* 135* 200* 118* 117*   D-Dimer No results for input(s): DDIMER in the last 72 hours. Hgb A1c Recent Labs    02/16/21 0042  HGBA1C 5.9*   Lipid Profile No results for input(s): CHOL, HDL, LDLCALC, TRIG, CHOLHDL, LDLDIRECT in the last 72 hours. Thyroid function studies Recent Labs    02/15/21 1220  TSH 0.178*   Anemia work up No results for input(s):  VITAMINB12, FOLATE, FERRITIN, TIBC, IRON, RETICCTPCT in the last 72 hours. Urinalysis    Component Value Date/Time   COLORURINE YELLOW 02/15/2021 2316   APPEARANCEUR CLEAR 02/15/2021 2316   LABSPEC 1.019 02/15/2021 2316   PHURINE 6.0 02/15/2021 2316   GLUCOSEU NEGATIVE 02/15/2021 2316   HGBUR MODERATE (A) 02/15/2021 2316   BILIRUBINUR NEGATIVE 02/15/2021 2316   KETONESUR 5 (A) 02/15/2021 2316   PROTEINUR 30 (A) 02/15/2021 2316   UROBILINOGEN 1.0 02/12/2020 1416   NITRITE NEGATIVE 02/15/2021 2316   LEUKOCYTESUR NEGATIVE 02/15/2021 2316   Sepsis Labs Invalid input(s): PROCALCITONIN,  WBC,  LACTICIDVEN Microbiology Recent Results (from the past 240 hour(s))  Blood Culture (routine x 2)     Status: None (Preliminary result)   Collection Time: 02/15/21  3:14 PM   Specimen: BLOOD  Result Value Ref Range Status   Specimen Description BLOOD SITE NOT SPECIFIED  Final   Special Requests   Final    BOTTLES DRAWN AEROBIC AND ANAEROBIC Blood Culture results may not be optimal due to an inadequate volume of blood received in culture bottles   Culture   Final    NO GROWTH 2 DAYS Performed at [facility], [place] [address], [place], [place] [address]    Report Status PENDING  Incomplete  Resp Panel by RT-PCR (Flu A&B, Covid) Nasopharyngeal Swab     Status: None   Collection Time: 02/15/21  4:25 PM   Specimen: Nasopharyngeal Swab; Nasopharyngeal(NP) swabs in vial transport medium  Result Value Ref Range Status   SARS Coronavirus 2 by RT PCR NEGATIVE NEGATIVE Final    Comment: (NOTE) SARS-CoV-2 target nucleic acids are NOT DETECTED.  The SARS-CoV-2 RNA is generally detectable in upper respiratory specimens during the acute phase of infection. The lowest concentration of SARS-CoV-2 viral copies this assay can detect is 138 copies/mL. A negative result does not preclude SARS-Cov-2 infection and should not be used as the sole basis for treatment or other patient management decisions. A  negative result may occur with  improper specimen collection/handling, submission of specimen other than nasopharyngeal swab,  presence of viral mutation(s) within the areas targeted by this assay, and inadequate number of viral copies(<138 copies/mL). A negative result must be combined with clinical observations, patient history, and epidemiological information. The expected result is Negative.  Fact Sheet for Patients:  EntrepreneurPulse.com.au  Fact Sheet for Healthcare Providers:  IncredibleEmployment.be  This test is no t yet approved or cleared by the [place] FDA and  has been authorized for detection and/or diagnosis of SARS-CoV-2 by FDA under an Emergency Use Authorization (EUA). This EUA will remain  in effect (meaning this test can be used) for the duration of the COVID-19 declaration under Section 564(b)(1) of the Act, 21 U.S.C.section 360bbb-3(b)(1), unless the authorization is terminated  or revoked sooner.       Influenza A by PCR NEGATIVE NEGATIVE Final   Influenza B by PCR NEGATIVE NEGATIVE Final    Comment: (NOTE) The Xpert Xpress SARS-CoV-2/FLU/RSV plus assay is intended as an aid in the diagnosis of influenza from Nasopharyngeal swab specimens and should not be used as a sole basis for treatment. Nasal washings and aspirates are unacceptable for Xpert Xpress SARS-CoV-2/FLU/RSV testing.  Fact Sheet for Patients: EntrepreneurPulse.com.au  Fact Sheet for Healthcare Providers: IncredibleEmployment.be  This test is not yet approved or cleared by the [place] FDA and has been authorized for detection and/or diagnosis of SARS-CoV-2 by FDA under an Emergency Use Authorization (EUA). This EUA will remain in effect (meaning this test can be used) for the duration of the COVID-19 declaration under Section 564(b)(1) of the Act, 21 U.S.C. section 360bbb-3(b)(1), unless the authorization  is terminated or revoked.  Performed at [facility], [place] [address] [place], [place] [address]   Urine culture     Status: Abnormal   Collection Time: 02/15/21 11:16 PM   Specimen: In/Out Cath Urine  Result Value Ref Range Status   Specimen Description IN/OUT CATH URINE  Final   Special Requests   Final    NONE Performed at [facility], [place] [address], [place], [place] [address]    Culture MULTIPLE SPECIES PRESENT, SUGGEST RECOLLECTION (A)  Final   Report Status 02/17/2021 FINAL  Final  Blood Culture (routine x 2)     Status: None (Preliminary result)   Collection Time: 02/16/21 12:42 AM   Specimen: BLOOD LEFT HAND  Result Value Ref Range Status   Specimen Description BLOOD LEFT HAND  Final   Special Requests   Final    BOTTLES DRAWN AEROBIC AND ANAEROBIC Blood Culture adequate volume   Culture   Final    NO GROWTH 1 DAY Performed at [facility], [place] [address] [place], [place] [address]    Report Status PENDING  Incomplete     Patient was seen and examined on the day of discharge and was found to be in stable condition. Time coordinating discharge: 35 minutes including assessment and coordination of care, as well as examination of the patient.   SIGNED:  Dessa Phi, DO [facility] 02/17/2021, 1:28 PM

## 2021-02-18 ENCOUNTER — Telehealth: Payer: Self-pay

## 2021-02-18 NOTE — Telephone Encounter (Signed)
Transition Care Management Unsuccessful Follow-up Telephone Call  Date of discharge and from where:  [facility] on 02/17/2021  Attempts:  1st Attempt  Reason for unsuccessful TCM follow-up call:  Left voice message , unable to reach at this time.

## 2021-02-20 LAB — CULTURE, BLOOD (ROUTINE X 2): Culture: NO GROWTH

## 2021-02-21 ENCOUNTER — Telehealth: Payer: Self-pay

## 2021-02-21 LAB — CULTURE, BLOOD (ROUTINE X 2)
Culture: NO GROWTH
Special Requests: ADEQUATE

## 2021-02-21 NOTE — Telephone Encounter (Signed)
Transition Care Management Unsuccessful Follow-up Telephone Call  Date of discharge and from where:  02/17/2021, [facility]  Attempts:  2nd Attempt  Reason for unsuccessful TCM follow-up call:  Left voice message on 3 [phone].  Call back requested to this CM.  Patient has appointment with Dr Joya Gaskins, 02/24/2021.

## 2021-02-22 ENCOUNTER — Other Ambulatory Visit: Payer: Self-pay

## 2021-02-22 ENCOUNTER — Encounter: Payer: Self-pay | Admitting: Speech Pathology

## 2021-02-22 ENCOUNTER — Telehealth: Payer: Self-pay

## 2021-02-22 ENCOUNTER — Ambulatory Visit: Payer: Medicare Other

## 2021-02-22 ENCOUNTER — Ambulatory Visit: Payer: Medicare Other | Admitting: Speech Pathology

## 2021-02-22 DIAGNOSIS — R2689 Other abnormalities of gait and mobility: Secondary | ICD-10-CM

## 2021-02-22 DIAGNOSIS — I69319 Unspecified symptoms and signs involving cognitive functions following cerebral infarction: Secondary | ICD-10-CM | POA: Diagnosis not present

## 2021-02-22 DIAGNOSIS — R4701 Aphasia: Secondary | ICD-10-CM | POA: Diagnosis not present

## 2021-02-22 DIAGNOSIS — R278 Other lack of coordination: Secondary | ICD-10-CM | POA: Diagnosis not present

## 2021-02-22 DIAGNOSIS — R260 Ataxic gait: Secondary | ICD-10-CM

## 2021-02-22 DIAGNOSIS — M6281 Muscle weakness (generalized): Secondary | ICD-10-CM | POA: Diagnosis not present

## 2021-02-22 DIAGNOSIS — I6329 Cerebral infarction due to unspecified occlusion or stenosis of other precerebral arteries: Secondary | ICD-10-CM | POA: Diagnosis not present

## 2021-02-22 DIAGNOSIS — R293 Abnormal posture: Secondary | ICD-10-CM | POA: Diagnosis not present

## 2021-02-22 DIAGNOSIS — R2681 Unsteadiness on feet: Secondary | ICD-10-CM

## 2021-02-22 DIAGNOSIS — Z9181 History of falling: Secondary | ICD-10-CM | POA: Diagnosis not present

## 2021-02-22 DIAGNOSIS — R41841 Cognitive communication deficit: Secondary | ICD-10-CM

## 2021-02-22 DIAGNOSIS — R41844 Frontal lobe and executive function deficit: Secondary | ICD-10-CM | POA: Diagnosis present

## 2021-02-22 DIAGNOSIS — R41842 Visuospatial deficit: Secondary | ICD-10-CM | POA: Diagnosis present

## 2021-02-22 MED ORDER — PROMETHAZINE-DM 6.25-15 MG/5ML PO SYRP: 5.0000 mL | ORAL_SOLUTION | Freq: Four times a day (QID) | ORAL | 0 refills | Status: DC | PRN

## 2021-02-22 NOTE — Telephone Encounter (Signed)
Call placed to patient's wife and informed her that Dr Joya Gaskins sent a prescription for cough medicine for her husband to his pharmacy.  She said she would pick it up later.  Also informed her that Dr Joya Gaskins will discuss the requirements for the letter at the upcoming appt.

## 2021-02-22 NOTE — Telephone Encounter (Signed)
Transition Care Management Follow-up Telephone Call  Call completed with patient's wife.  Date of discharge and from where: 02/17/2021, [facility]  How have you been since you were released from the hospital? His wife explained that he has been coughing a lot and the coughing is not related to eating. She wants to know if there is something she should give him for the coughing. She also notes that he has been crying because he wants to see his children who are in [place] and [place]. She said she will discuss further with Dr Joya Gaskins at the appt this week.  She said that the embassies need more specific information about her husband's medical issues.  Any questions or concerns? Yes - noted above  Items Reviewed: Did the pt receive and understand the discharge instructions provided? Yes  Medications obtained and verified? Yes  - she said he has all of his medications and she did not have any questions about his med regime at this time.  Other? No  Any new allergies since your discharge? No  Dietary orders reviewed? No Do you have support at home? Yes   Home Care and Equipment/Supplies: Were home health services ordered? no If so, what is the name of the agency? N/a  Has the agency set up a time to come to the patient's home? not applicable Were any new equipment or medical supplies ordered?  No What is the name of the medical supply agency? N/a Were you able to get the supplies/equipment? not applicable Do you have any questions related to the use of the equipment or supplies? No  Attends outpatient PT/OT/ST at [facility]. - has appointment today- 02/22/2021.  Functional Questionnaire: (I = Independent and D = Dependent) ADLs: needs assistance with personal care, medication management which is provided by his wife.  Ambulates with walker  Follow up appointments reviewed:  PCP Hospital f/u appt confirmed? Yes  Scheduled to see Dr Joya Gaskins on 02/24/2021.  [facility] f/u  appt confirmed?  None scheduled yet.   Are transportation arrangements needed? No  If their condition worsens, is the pt aware to call PCP or go to the Emergency Dept.? Yes Was the patient provided with contact information for the PCP's office or ED? Yes Was to pt encouraged to call back with questions or concerns? Yes

## 2021-02-22 NOTE — Therapy (Signed)
[place]. [place], [place], [address] Phone: [phone]   Fax:  [phone]  Physical Therapy Treatment  Patient Details  Name: Joe LAMPERT Sr. MRN: [id number] Date of Birth: [birth year] Referring Provider (PT): Joe Noble, MD   Encounter Date: 02/22/2021   PT End of Session - 02/22/21 1350     Visit Number 20    Date for PT Re-Evaluation 03/11/21    Authorization Type [employer] Medicare    PT Start Time 4854    PT Stop Time 1430    PT Time Calculation (min) 1425 min    Activity Tolerance Patient tolerated treatment well    Behavior During Therapy Flat affect             Past Medical History:  Diagnosis Date   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 ([place]) 06/03/2019   Cerebrovascular accident (CVA) due to occlusion of vertebral artery ([place])    Diabetes mellitus without complication ([place])    Dysphagia    post stroke   Hypertension    Stroke [facility])     Past Surgical History:  Procedure Laterality Date   IR ANGIO INTRA EXTRACRAN SEL COM CAROTID INNOMINATE UNI L MOD SED  03/10/2019   IR ANGIO VERTEBRAL SEL SUBCLAVIAN INNOMINATE BILAT MOD SED  03/10/2019   IR CT HEAD LTD  03/10/2019   IR PERCUTANEOUS ART THROMBECTOMY/INFUSION INTRACRANIAL INC DIAG ANGIO  03/10/2019   RADIOLOGY WITH ANESTHESIA N/A 03/10/2019   Procedure: IR WITH ANESTHESIA/CODE STROKE;  Surgeon: Radiologist, Medication, MD;  Location: [place];  Service: Radiology;  Laterality: N/A;    There were no vitals filed for this visit.   Subjective Assessment - 02/22/21 1349     Subjective Was admitted to hospital for fever, on ABX now. admitted 3 days 2 nights.  not moving as much.                               [place] Adult PT Treatment/Exercise - 02/22/21 0001       Ambulation/Gait   Gait Comments HHA walk 2 full laps one at a time, cues for head up and for big steps with the  left. FWd/back and turns HHA x 2      Therapeutic Activites    Other Therapeutic Activities STS  10 x 3. Stnading marches B x 10 alt, RLE up and LLE stance x 10.      Knee/Hip Exercises: Seated   Heel Slides Strengthening;Both;15 reps    Ball Squeeze x15 blue ball    Other Seated Knee/Hip Exercises supine reaches wit beach ball for thoracic and cervical ext x 10. seated retractions with yellow TB 10 x 2      Knee/Hip Exercises: Supine   Bridges 15 reps;Both;2 sets                      PT Short Term Goals - 12/23/20 1748       PT SHORT TERM GOAL #1   Title Pt will perform HEP with wife's assistance, for improved strength, balance, gait.    Time 4    Period Weeks    Status On-going   wife reports difficulty with getting [name] to do exercises at home              PT Long Term Goals - 02/08/21 1630  PT LONG TERM GOAL #1   Title Pt will perform progressive HEP with family supervision and assistance for improved balance, strength, gait.    Status On-going      PT LONG TERM GOAL #2   Title Pt will improve 5TSTS score to </= 20 seconds to demonstrate improved functional strength and decreased fall risk    Baseline Initial 54". 12/23/20: 35 seconds with arms of chair BUE support for push up. 02/08/21: 28Sec with BUE assist on mat to push up    Status On-going      PT LONG TERM GOAL #3   Title Pt will improve TUG score to less than or equal to 25 seconds for decreased fall risk.    Baseline Initial 59 sec. 12/23/20: 55 seconds. 02/08/21: 44 seconds - most difficulty and time with turns    Status On-going                   Plan - 02/22/21 1511     Clinical Impression Statement Pt returns to PT after gap in therapy d/t hospitalization for suspected pneumonia. He was in hospital 3 days and discharged with medication per wife. She reports no new concerns but he is moving a bit slower compared to before hospitalization. Overall he tolerted exercises well  today, reinforced HEP given last session to work on strength on home. Despite more cueing to keep erect posture and gaze forward insteda f down he had trouble with this today, when asked unable to discern reason why he maintained down gaze. Continue to monitor this and progress as toelrated    Rehab Potential Fair    PT Frequency 1x / week    PT Duration 4 weeks    PT Treatment/Interventions ADLs/Self Care Home Management;DME Instruction;Neuromuscular re-education;Balance training;Therapeutic exercise;Therapeutic activities;Functional mobility training;Stair training;Gait training;Patient/family education;Energy conservation;Manual techniques    PT Next Visit Plan continue to work on his awareness of posture and work on this, also look to see if we can get better control of the left knee with walking - possibly use ace wrap or TB wrapping technique to prevent hyperextension in stance during gait training    PT Home Exercise Plan Access Code: [email or web address]  URL: [email or web address]  Date: 02/08/2021  Prepared by: Sherlynn Stalls    Exercises  Supine Heel Slide - 1 x daily - 7 x weekly - 3 sets - 10 reps  Clamshell - 1 x daily - 7 x weekly - 3 sets - 10 reps  Supine Bridge - 1 x daily - 7 x weekly - 3 sets - 10 reps  Seated March - 1 x daily - 7 x weekly - 3 sets - 10 reps  Sit to Stand with Counter Support - 1 x daily - 7 x weekly - 3 sets - 10 reps  Seated Scapular Retraction - 1 x daily - 7 x weekly - 3 sets - 10 reps    Consulted and Agree with Plan of Care Patient;Family member/caregiver    Family Member Consulted wife             Patient will benefit from skilled therapeutic intervention in order to improve the following deficits and impairments:  Abnormal gait, Decreased coordination, Difficulty walking, Decreased safety awareness, Decreased balance, Decreased strength, Decreased mobility  Visit Diagnosis: Muscle weakness (generalized)  Other abnormalities of gait and  mobility  Abnormal posture  Ataxic gait  Unsteadiness on feet     Problem List Patient Active Problem List   Diagnosis Date  Noted   Fever, unspecified 02/16/2021   Cough productive of clear sputum 02/16/2021   Ankle edema, bilateral 02/16/2021   Fatigue 02/16/2021   Left upper lobe pneumonia 16/06/9603   Acute metabolic encephalopathy 54/05/8118   Left hemiparesis ([place]) 12/06/2020   Benign essential HTN    Cognitive deficit, post-stroke    Controlled type 2 diabetes mellitus with hyperglycemia, without long-term current use of insulin ([place])    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 [place], [place], [address] Phone: [phone]   Fax:  [phone]  Name: Joe MATSUO Sr. MRN: [id number] Date of Birth: 10/17/1950

## 2021-02-22 NOTE — Therapy (Signed)
[place]. [place], [place], [address] Phone: [phone]   Fax:  [phone]  Speech Language Pathology Treatment  Patient Details  Name: Joe WITT Sr. MRN: [id number] Date of Birth: April 12, 1951 Referring Provider (SLP): Elsie Stain, MD   Encounter Date: 02/22/2021   End of Session - 02/22/21 1502     Visit Number 32    Number of Visits --   Asking for 16 visits x2/week for 8 weeks.   Date for SLP Re-Evaluation 03/22/21    Authorization Time Period 03/22/21    Authorization - Visit Number 7    Authorization - Number of Visits 22    SLP Start Time 1440    SLP Stop Time  1520    SLP Time Calculation (min) 40 min    Activity Tolerance Patient tolerated treatment well;Patient limited by fatigue             Past Medical History:  Diagnosis Date   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 ([place]) 06/03/2019   Cerebrovascular accident (CVA) due to occlusion of vertebral artery ([name])    Diabetes mellitus without complication ([place])    Dysphagia    post stroke   Hypertension    Stroke [facility])     Past Surgical History:  Procedure Laterality Date   IR ANGIO INTRA EXTRACRAN SEL COM CAROTID INNOMINATE UNI L MOD SED  03/10/2019   IR ANGIO VERTEBRAL SEL SUBCLAVIAN INNOMINATE BILAT MOD SED  03/10/2019   IR CT HEAD LTD  03/10/2019   IR PERCUTANEOUS ART THROMBECTOMY/INFUSION INTRACRANIAL INC DIAG ANGIO  03/10/2019   RADIOLOGY WITH ANESTHESIA N/A 03/10/2019   Procedure: IR WITH ANESTHESIA/CODE STROKE;  Surgeon: Radiologist, Medication, MD;  Location: [place];  Service: Radiology;  Laterality: N/A;    There were no vitals filed for this visit.   Subjective Assessment - 02/22/21 1454     Subjective Pt reported that he feels tired today.    Currently in Pain? No/denies                   ADULT SLP TREATMENT - 02/22/21 1519       General Information    Behavior/Cognition Lethargic;Requires cueing      Treatment Provided   Treatment provided Cognitive-Linquistic      Cognitive-Linquistic Treatment   Treatment focused on Aphasia;Cognition    Skilled Treatment Informally assessed pt using SLUMS. See CI statement for more information. Pt required verbal cueing to attend. Pt wife reports he has been more tired since returning from the hospital.      Assessment / Recommendations / Plan   Plan Continue with current plan of care      Progression Toward Goals   Progression toward goals Progressing toward goals                SLP Short Term Goals - 02/22/21 1517       SLP SHORT TERM GOAL #1   Title Pt will name 6/10 objects independenty over 3 sessions to increase expressive communication.    Time 1    Period Weeks    Status On-going   Pt is able to make a choice verbally, but may not correctly identify.     SLP SHORT TERM GOAL #2   Title Pt will follow through with swallowing saliva before speaking at home as reported by caregiver.    Time 1    Period Weeks  Status On-going      SLP SHORT TERM GOAL #3   Title Pt will demonstrate appropriate alternating attention between two tasks with <1 verbal/visual cue.    Time 1    Period Weeks    Status On-going              SLP Long Term Goals - 02/22/21 1518       SLP LONG TERM GOAL #1   Title Client will utilize compensatory strategies to communicate wants and needs effectively to different conversational partners and participate socially in functional living environment.    Time 5    Period Weeks    Status On-going      SLP LONG TERM GOAL #2   Title Client will demonstrate understanding of verbal communication related to daily activities and utilize compensatory strategies to more effectively communicate in their functional living environment.    Time 5    Period Weeks    Status On-going      SLP LONG TERM GOAL #3   Title Patient will develop functional attention  skills to effectively attend to and communicate in tasks of daily living in their functional living environment.    Time 5    Period Weeks    Status On-going              Plan - 02/22/21 1516     Clinical Impression Statement Pt was hospitalized a few days ago. Pt returns after course of antibiotics. Some confusion noted during hospital stay. SLP informally assessed pt with SLUMS (7/30); pt last assessed with SLUMS on 01/18/21 and scored a 9/30. Will continue to monitor for any changes. Pt extremely fatigued today.    Speech Therapy Frequency 2x / week    Duration 12 weeks    Treatment/Interventions Aspiration precaution training;Cueing hierarchy;Patient/family education;Functional tasks;Environmental controls;Cognitive reorganization;Multimodal communcation approach;Language facilitation;Compensatory techniques;Internal/external aids;SLP instruction and feedback;Compensatory strategies    Potential to Achieve Goals Fair    Potential Considerations Ability to learn/carryover information;Severity of impairments    Consulted and Agree with Plan of Care Patient;Family member/caregiver             Patient will benefit from skilled therapeutic intervention in order to improve the following deficits and impairments:   Aphasia  Cognitive communication deficit    Problem List Patient Active Problem List   Diagnosis Date Noted   Fever, unspecified 02/16/2021   Cough productive of clear sputum 02/16/2021   Ankle edema, bilateral 02/16/2021   Fatigue 02/16/2021   Left upper lobe pneumonia 09/32/6712   Acute metabolic encephalopathy 45/80/9983   Left hemiparesis ([place]) 12/06/2020   Benign essential HTN    Cognitive deficit, post-stroke    Controlled type 2 diabetes mellitus with hyperglycemia, without long-term current use of insulin ([place])    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 [place], [place], [address] Phone: [phone]   Fax:  [phone]   Name: BREYLEN AGYEMAN Sr. MRN: [id number] Date of Birth: [birth year]

## 2021-02-22 NOTE — Telephone Encounter (Signed)
I have made a note about the immigration issue, will have to wait for ov to discuss  For the cough I have sent a cough syrup to his [facility] for prn use

## 2021-02-23 ENCOUNTER — Telehealth: Payer: Self-pay | Admitting: Critical Care Medicine

## 2021-02-23 NOTE — Telephone Encounter (Signed)
That call was received 02/21/2021 and I have spoken to the patient's wife since then.

## 2021-02-23 NOTE — Telephone Encounter (Signed)
Copied from [place] [id number]. Topic: General - Call Back - No Documentation >> Feb 21, 2021 12:00 PM Erick Blinks wrote: Reason for CRM: Pt returned missed call from [facility]. Called office, Please advise

## 2021-02-24 ENCOUNTER — Encounter: Payer: Self-pay | Admitting: Critical Care Medicine

## 2021-02-24 ENCOUNTER — Encounter: Payer: Self-pay | Admitting: Speech Pathology

## 2021-02-24 ENCOUNTER — Ambulatory Visit: Payer: Medicare Other | Admitting: Speech Pathology

## 2021-02-24 ENCOUNTER — Ambulatory Visit: Payer: Medicare Other | Admitting: Occupational Therapy

## 2021-02-24 ENCOUNTER — Other Ambulatory Visit: Payer: Self-pay

## 2021-02-24 ENCOUNTER — Ambulatory Visit: Payer: Medicare Other | Attending: Critical Care Medicine | Admitting: Critical Care Medicine

## 2021-02-24 DIAGNOSIS — R293 Abnormal posture: Secondary | ICD-10-CM | POA: Diagnosis not present

## 2021-02-24 DIAGNOSIS — I69391 Dysphagia following cerebral infarction: Secondary | ICD-10-CM | POA: Diagnosis not present

## 2021-02-24 DIAGNOSIS — R4701 Aphasia: Secondary | ICD-10-CM | POA: Diagnosis not present

## 2021-02-24 DIAGNOSIS — R278 Other lack of coordination: Secondary | ICD-10-CM | POA: Diagnosis not present

## 2021-02-24 DIAGNOSIS — I10 Essential (primary) hypertension: Secondary | ICD-10-CM | POA: Diagnosis not present

## 2021-02-24 DIAGNOSIS — R2689 Other abnormalities of gait and mobility: Secondary | ICD-10-CM | POA: Diagnosis not present

## 2021-02-24 DIAGNOSIS — E785 Hyperlipidemia, unspecified: Secondary | ICD-10-CM

## 2021-02-24 DIAGNOSIS — E1165 Type 2 diabetes mellitus with hyperglycemia: Secondary | ICD-10-CM | POA: Diagnosis not present

## 2021-02-24 DIAGNOSIS — R41842 Visuospatial deficit: Secondary | ICD-10-CM

## 2021-02-24 DIAGNOSIS — R260 Ataxic gait: Secondary | ICD-10-CM | POA: Diagnosis not present

## 2021-02-24 DIAGNOSIS — R2681 Unsteadiness on feet: Secondary | ICD-10-CM | POA: Diagnosis not present

## 2021-02-24 DIAGNOSIS — I6329 Cerebral infarction due to unspecified occlusion or stenosis of other precerebral arteries: Secondary | ICD-10-CM | POA: Diagnosis not present

## 2021-02-24 DIAGNOSIS — J189 Pneumonia, unspecified organism: Secondary | ICD-10-CM

## 2021-02-24 DIAGNOSIS — R41841 Cognitive communication deficit: Secondary | ICD-10-CM

## 2021-02-24 DIAGNOSIS — I69319 Unspecified symptoms and signs involving cognitive functions following cerebral infarction: Secondary | ICD-10-CM | POA: Diagnosis not present

## 2021-02-24 DIAGNOSIS — Z9181 History of falling: Secondary | ICD-10-CM | POA: Diagnosis not present

## 2021-02-24 DIAGNOSIS — G9341 Metabolic encephalopathy: Secondary | ICD-10-CM

## 2021-02-24 DIAGNOSIS — M6281 Muscle weakness (generalized): Secondary | ICD-10-CM | POA: Diagnosis not present

## 2021-02-24 NOTE — Assessment & Plan Note (Signed)
Persistent dysphagia with recent pneumonia from aspiration now improving continue speech-language therapy

## 2021-02-24 NOTE — Therapy (Signed)
[place]. [place], [place], [address] Phone: [phone]   Fax:  [phone]  Speech Language Pathology Treatment  Patient Details  Name: Joe LENOIR Sr. MRN: [id number] Date of Birth: 06-16-1951 Referring Provider (SLP): Elsie Stain, MD   Encounter Date: 02/24/2021   End of Session - 02/24/21 1439     Visit Number 33    Number of Visits --   Asking for 16 visits x2/week for 8 weeks.   Date for SLP Re-Evaluation 03/22/21    Authorization Time Period 03/22/21    Authorization - Visit Number 8    Authorization - Number of Visits 55    SLP Start Time 1400    SLP Stop Time  1440    SLP Time Calculation (min) 40 min    Activity Tolerance Patient tolerated treatment well;Patient limited by fatigue             Past Medical History:  Diagnosis Date   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 ([place]) 06/03/2019   Cerebrovascular accident (CVA) due to occlusion of vertebral artery ([name])    Diabetes mellitus without complication ([place])    Dysphagia    post stroke   Hypertension    Stroke [facility])     Past Surgical History:  Procedure Laterality Date   IR ANGIO INTRA EXTRACRAN SEL COM CAROTID INNOMINATE UNI L MOD SED  03/10/2019   IR ANGIO VERTEBRAL SEL SUBCLAVIAN INNOMINATE BILAT MOD SED  03/10/2019   IR CT HEAD LTD  03/10/2019   IR PERCUTANEOUS ART THROMBECTOMY/INFUSION INTRACRANIAL INC DIAG ANGIO  03/10/2019   RADIOLOGY WITH ANESTHESIA N/A 03/10/2019   Procedure: IR WITH ANESTHESIA/CODE STROKE;  Surgeon: Radiologist, Medication, MD;  Location: [facility] [place];  Service: Radiology;  Laterality: N/A;    There were no vitals filed for this visit.   Subjective Assessment - 02/24/21 1405     Subjective Pt reports no changes and that he is feeling well today.    Currently in Pain? No/denies                   ADULT SLP TREATMENT - 02/24/21 1437       General  Information   Behavior/Cognition Alert;Cooperative      Treatment Provided   Treatment provided Cognitive-Linquistic      Cognitive-Linquistic Treatment   Treatment focused on Aphasia;Cognition    Skilled Treatment Facilitated verbal expression and thought organization using functional scenarios for cars/mechanics. Pt required modA to provide the steps to "jumping a car" and "putting gas into a car" Attempted problem solving, but had some difficulty. Great idea for next goal.      Assessment / Recommendations / Plan   Plan Continue with current plan of care      Progression Toward Goals   Progression toward goals Progressing toward goals                SLP Short Term Goals - 02/24/21 1440       SLP SHORT TERM GOAL #1   Title Pt will name 6/10 objects independenty over 3 sessions to increase expressive communication.    Time 4    Period Weeks    Status On-going   Pt is able to make a choice verbally, but may not correctly identify.     SLP SHORT TERM GOAL #2   Title Pt will follow through with swallowing saliva before speaking at home as reported by  caregiver.    Time 1    Period Weeks    Status Achieved      SLP SHORT TERM GOAL #3   Title Pt will demonstrate appropriate alternating attention between two tasks with <1 verbal/visual cue.    Time 4    Period Weeks    Status On-going      SLP SHORT TERM GOAL #4   Title Pt will solve basic problems in safety scenarios with minA.    Time 4    Period Weeks    Status New              SLP Long Term Goals - 02/24/21 1441       SLP LONG TERM GOAL #1   Title Client will utilize compensatory strategies to communicate wants and needs effectively to different conversational partners and participate socially in functional living environment.    Time 4    Period Weeks    Status On-going      SLP LONG TERM GOAL #2   Title Client will demonstrate understanding of verbal communication related to daily activities and  utilize compensatory strategies to more effectively communicate in their functional living environment.    Time 44    Period Weeks    Status On-going      SLP LONG TERM GOAL #3   Title Patient will develop functional attention skills to effectively attend to and communicate in tasks of daily living in their functional living environment.    Time 4    Period Weeks    Status On-going              Plan - 02/24/21 1439     Clinical Impression Statement Pt was alert for entire session today. More expressive with his eyes today and provided comments in sentence form. Several perseverations noted/    Speech Therapy Frequency 2x / week    Duration 12 weeks    Treatment/Interventions Aspiration precaution training;Cueing hierarchy;Patient/family education;Functional tasks;Environmental controls;Cognitive reorganization;Multimodal communcation approach;Language facilitation;Compensatory techniques;Internal/external aids;SLP instruction and feedback;Compensatory strategies    Potential to Achieve Goals Fair    Potential Considerations Ability to learn/carryover information;Severity of impairments    Consulted and Agree with Plan of Care Patient;Family member/caregiver             Patient will benefit from skilled therapeutic intervention in order to improve the following deficits and impairments:   Aphasia  Cognitive communication deficit    Problem List Patient Active Problem List   Diagnosis Date Noted   Fever, unspecified 02/16/2021   Cough productive of clear sputum 02/16/2021   Ankle edema, bilateral 02/16/2021   Fatigue 02/16/2021   Left upper lobe pneumonia 93/81/0175   Acute metabolic encephalopathy 06/28/8526   Left hemiparesis ([place]) 12/06/2020   Benign essential HTN    Cognitive deficit, post-stroke    Controlled type 2 diabetes mellitus with hyperglycemia, without long-term current use of insulin ([place])    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 [place], [place], [address]  Phone: [phone]   Fax:  [phone]   Name: Joe SERANO Sr. MRN: [id number] Date of Birth: [birth year]

## 2021-02-24 NOTE — Assessment & Plan Note (Signed)
Continue lipid therapy

## 2021-02-24 NOTE — Assessment & Plan Note (Signed)
No change in current medications diabetes was controlled

## 2021-02-24 NOTE — Assessment & Plan Note (Signed)
Previously well controlled no change in medications

## 2021-02-24 NOTE — Assessment & Plan Note (Signed)
Acute encephalopathy has resolved

## 2021-02-24 NOTE — Therapy (Signed)
[place] [place], [place], [address] Phone: [phone]   Fax:  [phone]  Occupational Therapy Treatment  Patient Details  Name: Joe Perry Sr. 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 ([place]) 06/03/2019   Cerebrovascular accident (CVA) due to occlusion of vertebral artery ([name])    Diabetes mellitus without complication ([place])    Dysphagia    post stroke   Hypertension    Stroke [facility])     Past Surgical History:  Procedure Laterality Date   IR ANGIO INTRA EXTRACRAN SEL COM CAROTID INNOMINATE UNI L MOD SED  03/10/2019   IR ANGIO VERTEBRAL SEL SUBCLAVIAN INNOMINATE BILAT MOD SED  03/10/2019   IR CT HEAD LTD  03/10/2019   IR PERCUTANEOUS ART THROMBECTOMY/INFUSION INTRACRANIAL INC DIAG ANGIO  03/10/2019   RADIOLOGY WITH ANESTHESIA N/A 03/10/2019   Procedure: IR WITH ANESTHESIA/CODE STROKE;  Surgeon: Radiologist, Medication, MD;  Location: [place];  Service: Radiology;  Laterality: N/A;    There were no vitals filed for this visit.   Subjective Assessment - 02/24/21 1446     Subjective  "I can see the numbers"    Patient is accompanied by: Family member   Wife Joe Perry)   Pertinent History Hx of CVA (2017, March 2020, July 2020); T2DM; HTN    Limitations Fall risk; L homonymous  hemianopia (per chart review); dysphagia; cognitive-communication deficit    Patient Stated Goals Get stronger; improve walking; improve independence    Currently in Pain? No/denies             OT Treatment/Exercises - 02/24/21 1455 Attempted to copy simple pegboard pattern; despite scaffolding and cueing, pt was unable to copy pattern. OT downgraded activity to decrease demand on alternating attention w/ pt placing pegs into board w/out a pattern to  facilitate hand strengthening and dexterity w/ good results  Placing marbles 1 at a time on top of easy-grip pegs and pt was able to complete w/out difficulty and no drops  Visual perceptual activities: trailmaking (sample, #1-8), spot the difference, simple maze, number cancellation. Pt only experienced success w/ simple maze activity, able to complete task from start to finish crossing over 2 borders in 2 trials, as well as w/ number cancellation activity, finding 43% of targets w/out assistance     OT Short Term Goals - 02/10/21 1445       OT SHORT TERM GOAL #1   Title Pt will be able to sequence through one BADL task (e.g., donning pants, washing UB) w/ SPV in at least 2 trials    Baseline Min A w/ BADL tasks    Time 6    Period Weeks  Status New    Target Date 03/25/21      OT SHORT TERM GOAL #2   Title Pt will improve participation in functional FM tasks as evidenced by completing 9-HPT w/ L hand in less than 3 minutes    Baseline 9-HPT w/ R hand (1 min, 14 sec); L hand (>3 min)    Time 6    Period Weeks    Status New      OT SHORT TERM GOAL #3   Title Pt will perform simple visual scanning at table with at least 90% accuracy.    Baseline Visual-perceptual deficits    Time 6    Period Weeks    Status New      OT SHORT TERM GOAL #4   Title Pt will improve GM coordination during BADLs as evidenced by increasing Box and Blocks test by at least 4 blocks    Baseline Box and Blocks (30 seconds) w/ R hand (9); L hand (4)     Time 6    Period Weeks    Status New              OT Long Term Goals - 02/10/21 1445       OT LONG TERM GOAL #1   Title Pt will be able to participate in HEP for strength, coordination, and visual-perception w/ SPV    Baseline No HEP at this time    Time 12    Period Weeks    Status New    Target Date 05/06/21      OT LONG TERM GOAL #2   Title Pt will improve participation in functional FM tasks as evidenced by decreasing 9-HPT time w/ R, dominant hand by at least 14 seconds    Baseline 9-HPT w/ R hand (1 min, 14 sec); L hand (>3 min)    Time 12    Period Weeks    Status New      OT LONG TERM GOAL #3   Title Pt will be able to complete simple IADL task (e.g., washing plastic dish, folding towel) w/ SPV for safety in at least 2 trials    Baseline Very minimal participation in IADLs at this time    Time 12    Period Weeks    Status New      OT LONG TERM GOAL #4   Title Pt will demonstrate full BADL activity (dressing, bathing, toileting) w/ SPV for safety    Baseline Completing BADLs w/ Min A, per spouse report    Time 12    Period Weeks    Status New              Plan - 02/24/21 1448     Clinical Impression Statement Pt did well w/ hand strengthening/FMC tasks completed this session. OT attempted to introduce visual perceptual/visual scanning activities into session and pt demo'd significant difficulty. Considering aphasia, it is unclear if limitations were visual perceptual, comphrehension-related, or in combination. OT also completed simple color blindness screening (modified due to aphasia) w/ no indication of difficulty differentiating colors, but also not definitive. Potential L visual field cut or inattention observed during number cancellation activity as evidenced by 7/8 missed targets being positioned on the L side of the page.   OT Occupational Profile and History Detailed Assessment- Review of Records and additional review of physical, cognitive,  psychosocial history related to current functional performance    Occupational performance deficits (Please refer to evaluation for details): ADL's;IADL's;Work;Leisure;Social Participation  Body Structure / Function / Physical Skills ADL;ROM;UE functional use;Balance;Decreased knowledge of use of DME;FMC;Mobility;Vestibular;Body mechanics;Dexterity;Gait;Sensation;Vision;GMC;Strength;Proprioception;IADL;Coordination    Cognitive Skills Attention;Problem Solve;Safety Awareness;Sequencing    Rehab Potential Good    Clinical Decision Making Several treatment options, min-mod task modification necessary    Comorbidities Affecting Occupational Performance: May have comorbidities impacting occupational performance    Modification or Assistance to Complete Evaluation  Min-Moderate modification of tasks or assist with assess necessary to complete eval    OT Frequency 1x / week    OT Duration 12 weeks    OT Treatment/Interventions Self-care/ADL training;Therapeutic exercise;Visual/perceptual remediation/compensation;Aquatic Therapy;Moist Heat;Cryotherapy;Neuromuscular education;Patient/family education;Fluidtherapy;Energy conservation;Therapist, nutritional;Therapeutic activities;Balance training;DME and/or AE instruction;Manual Therapy;Passive range of motion;Cognitive remediation/compensation    Plan BUE strength/FMC; re-assess visual scanning w/ downgraded tasks   Consulted and Agree with Plan of Care Patient;Family member/caregiver    Family Member Consulted Wife Transport planner)             Patient will benefit from skilled therapeutic intervention in order to improve the following deficits and impairments:   Body Structure / Function / Physical Skills: ADL, ROM, UE functional use, Balance, Decreased knowledge of use of DME, FMC, Mobility, Vestibular, Body mechanics, Dexterity, Gait, Sensation, Vision, GMC, Strength, Proprioception, IADL, Coordination Cognitive Skills: Attention, Problem Solve,  Safety Awareness, Sequencing     Visit Diagnosis: No diagnosis found.    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 [place], [place], [address] Phone: [phone]   Fax:  [phone]  Name: ANTONIOUS OMAHONEY Sr. MRN: [id number] Date of Birth: Jan 11, 1951

## 2021-02-24 NOTE — Progress Notes (Addendum)
Subjective:    Patient ID: Joe Eddy Sr., male    DOB: 07-Apr-1951, 70 y.o.   MRN: [id number] Virtual Visit via Telephone Note  I connected with Joe Eddy Sr. on 02/24/21 at 10:30 AM EDT by telephone and verified that I am speaking with the correct person using two identifiers.   Consent:  I discussed the limitations, risks, security and privacy concerns of performing an evaluation and management service by telephone and the availability of in person appointments. I also discussed with the patient that there may be a patient responsible charge related to this service. The patient expressed understanding and agreed to proceed.  Location of patient:Pt at home  Location of provider:I am in my office   Persons participating in the televisit with the patient.   Pt spouse on the phone and conducted the interview    History of Present Illness:   09/03/19 This is a 70 year old male here for post hospital follow-up and a face-to-face exam.  His last visit was a telephone visit in November with one of our APP's.  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  Patient also has type 2 diabetes on Metformin, dyslipidemia on atorvastatin with a goal of LDL less than 70, hypertension on amlodipine 5 mg daily, and urinary incontinence now using diapering supplies.  Note the patient has been seen by neurology and physical medicine and rehab however neither service had yet ordered any outpatient speech physical or occupational therapy.  He was seen briefly by home-based therapy with [facility].  He is here to establish further for primary care so that we can follow through on these needs.  Also the patient spouse complains that he sleeps a lot during the daytime but upon further review he is taking his Seroquel multiple times during the day instead of at bedtime therefore he is not  sleeping at night and is sleeping during the day.  Note the patient is on Metformin and his blood glucoses have been well controlled  Blood pressures also been well controlled and today in the office blood pressure is 125/77  08/03/2020 Chronic cough 4 weeks This patient comes in today to establish for primary care as his other primary care physician has left the practice.  I did seen this patient previously earlier in the year.  History of hypertension which has been well controlled and on arrival he is 120/79.  Previous stroke involving right posterior communicating artery with thrombectomy removal of large clot and now with residual deficits of hemiparesis on the right upper extremity and significant dysphagia and trouble swallowing.  He had been seeing speech pathology and they are requiring another modified barium swallow for this patient.  He is following with a nectar thick liquids and having his spouse watch him eat however she states he still coughs quite a bit when eating and has been to the emergency room several times for this.  Also the emergency room recently for bladder irritation found to have a E. coli UTI and was given a course of antibiotics.  Patient maintains his aspirin atorvastatin and Aricept but is now off Brilinta.  Patient is taking the Seroquel at bedtime this is cause less drowsiness during the daytime Patient also has diabetes is only taking 250 mg of Metformin daily on arrival blood sugar is 223 and hemoglobin A1c has increased to 6.7 on this visit  10/04/20 Patient is  seen in return follow-up accompanied by his spouse who is his caregiver.  Spouse expresses frustration and she is having increased difficulty caring for this patient.  He does have a chronic cough and drooling from his previous stroke he cannot swallow he cannot talk at this time.  The patient does not had no falls at this time.  He has been compliant with all his medications.  On arrival blood pressure is  145/84 oxygen level is adequate blood glucose is 153  Patient has no other specific complaints other than the chronic cough at this time.  He was to be referred to speech pathology however the consult has not yet occurred Note due to his medical status he is not a candidate for colonoscopy and Pneumovax is not indicated at this time  The patient's spouse is indicating a desire to look into long-term care options  12/06/2020 Patient seen in return follow-up doing fairly well.  Has seen physical therapy and speech therapy and is improving with swallowing speech and also muscle strength.  He is now using a walker instead of a wheelchair. On arrival blood pressure is 130/78.  His blood sugar was 58 he has been taking Metformin 1000 mg twice daily A1c is 5.6   02/24/2021 This patient is seen by way of a telephone visit.  I conducted the visit with the patient's spouse as the patient is unable to communicate over the phone.  Patient was just in the hospital for left upper lobe pneumonia likely due to aspiration.  Patient still has dysphagia due to his prior stroke.  Patient now was discharged after 2-day hospitalization in improved status.  He is off oxygen off antibiotics.  He is going to outpatient physical therapy and speech-language pathology.  He is on a dysphagia diet and breathing is improved.  The wife is requesting a new letter to allow his sons from [place] come to visit him individually.  She wants more specificity to his diagnoses and the letter I will produce this.  Below is a copy of the discharge summary Admit date: 02/15/2021 Discharge date: 02/17/2021   Admitted From: Home Disposition: Home   Recommendations for Outpatient Follow-:  1. Follow up with PCP in 1 week   Discharge Condition: Stable CODE STATUS: Full code Diet recommendation: Heart healthy diet   Brief/Interim Summary: Joe BULKLEY Sr. is a 70 year old male with past medical history significant for previous strokes  with residual aphasia and hemiparesis, diabetes mellitus, hypertension, dysphagia, cognitive deficits.  Apparently his aphasia gets worse whenever he gets ill with an infection.  Per family, EMS was called due to worsening left-sided weakness 4 days ago, patient developed a fever day before admission.  EMS evaluated patient, but patient was not brought to the ED at that time.  Patient now presenting with fever, work-up revealed suspicion for pneumonia in the left upper and left lower lobe.  Treated with empiric Rocephin, azithromycin.  Sepsis continue to improve.  Patient was evaluated by speech-language pathology, placed on dysphagia diet.  He remained fever free, on room air and was discharged home on oral antibiotics.   Discharge Diagnoses:  Principal Problem:   Left upper lobe pneumonia Active Problems:   Dysphagia due to recent stroke   Controlled type 2 diabetes mellitus with hyperglycemia, without long-term current use of insulin ([name])   Cognitive deficit, post-stroke   Benign essential HTN   Left hemiparesis ([place])   Acute metabolic encephalopathy     Sepsis secondary to CAP, with  concern for aspiration pneumonia -Presented with fever of 101.5, tachycardia 104 -Blood cultures negative to date -Continue Rocephin, azithromycin -SLP evaluation recommended dysphagia diet   Acute metabolic encephalopathy with underlying dementia -Secondary to above infection -Improved -Continue home Seroquel, Aricept  Diabetes mellitus type 2 -Continue SSI   Hypertension -Continue Norvasc   History of stroke with residual left-sided hemiparesis, cognitive deficit, dysphagia, aphasia -PT OT recommended home health -Continue aspirin, Lipitor    Past Medical History:  Diagnosis Date   Acute metabolic encephalopathy 1/61/0960   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 ([place]) 06/03/2019   Cerebrovascular accident (CVA) due to occlusion of vertebral  artery ([place])    Diabetes mellitus without complication ([place])    Dysphagia    post stroke   Hypertension    Stroke ([place])      Family History  Problem Relation Age of Onset   Diabetes Other    Hypertension Other    Healthy Mother    Healthy Father      Social History   Socioeconomic History   Marital status: Married    Spouse name: [name]   Number of children: Not on file   Years of education: Not on file   Highest education level: Not on file  Occupational History   Not on file  Tobacco Use   Smoking status: Never   Smokeless tobacco: Never  Vaping Use   Vaping Use: Never used  Substance and Sexual Activity   Alcohol use: No   Drug use: No   Sexual activity: Not Currently  Other Topics Concern   Not on file  Social History Narrative   12/16/19 Lives with wife   Social Determinants of Health   Financial Resource Strain: Not on file  Food Insecurity: Not on file  Transportation Needs: Not on file  Physical Activity: Not on file  Stress: Not on file  Social Connections: Not on file  Intimate Partner Violence: Not on file     No Known Allergies   Outpatient Medications Prior to Visit  Medication Sig Dispense Refill   acetaminophen (TYLENOL) 325 MG tablet Take 2 tablets (650 mg total) by mouth every 4 (four) hours as needed for mild pain (or temp > 37.5 C (99.5 F)).     amLODipine (NORVASC) 5 MG tablet Take 1 tablet (5 mg total) by mouth daily. To lower blood pressure 90 tablet 2   aspirin EC 81 MG tablet Take 1 tablet (81 mg total) by mouth daily. 100 tablet 3   atorvastatin (LIPITOR) 40 MG tablet One daily in the evening to lower cholesterol 90 tablet 2   cetirizine (ZYRTEC) 10 MG tablet Take 1 tablet (10 mg total) by mouth daily. 30 tablet 11   donepezil (ARICEPT) 10 MG tablet TAKE 1 TABLET BY MOUTH AT BEDTIME 90 tablet 0   metFORMIN (GLUCOPHAGE) 500 MG tablet Take 1 tablet (500 mg total) by mouth 2 (two) times daily with a meal. 120 tablet 1    promethazine-dextromethorphan (PROMETHAZINE-DM) 6.25-15 MG/5ML syrup Take 5 mLs by mouth 4 (four) times daily as needed for cough. 118 mL 0   QUEtiapine (SEROQUEL) 25 MG tablet Take 2 tablets (50 mg total) by mouth at bedtime. 60 tablet 3   No facility-administered medications prior to visit.      Review of Systems Constitutional:   No  weight loss, night sweats,  Fevers, chills, fatigue, lassitude. HEENT:    headaches,  Tooth/dental problems,  Sore throat,  No sneezing, itching, ear ache, nasal congestion, post nasal drip,   CV:  No chest pain,  Orthopnea, PND, swelling in lower extremities, anasarca, dizziness, palpitations  GI  No heartburn, indigestion, abdominal pain, nausea, vomiting, diarrhea, change in bowel habits, loss of appetite  Resp: No shortness of breath with exertion or at rest.  No excess mucus, no productive cough,   No cough,  No coughing up of blood.  No change in color of mucus.  No wheezing.  No chest wall deformity  Skin: no rash or lesions.  GU: no dysuria, change in color of urine, no urgency or frequency.  No flank pain.  MS:  No joint pain or swelling.  No decreased range of motion.  No back pain.  Psych:   change in mood or affect.  depression or anxiety.   memory loss.     Objective:   Physical Exam There were no vitals filed for this visit.  No exam this is a telephone    Assessment & Plan:  I personally reviewed all images and lab data in the [facility] system as well as any outside material available during this office visit and agree with the  radiology impressions.   Dysphagia due to recent stroke Persistent dysphagia with recent pneumonia from aspiration now improving continue speech-language therapy  Benign essential HTN Previously well controlled no change in medications  Left upper lobe pneumonia By history appears to be resolving we will bring patient in for direct exam to follow-up x-rays  Controlled type 2 diabetes  mellitus with hyperglycemia, without long-term current use of insulin ([name]) No change in current medications diabetes was controlled  Acute metabolic encephalopathy Acute encephalopathy has resolved  Dyslipidemia, goal LDL below 70 Continue lipid therapy   Diagnoses and all orders for this visit:  Dysphagia due to recent stroke  Benign essential HTN  Pneumonia of left upper lobe due to infectious organism  Controlled type 2 diabetes mellitus with hyperglycemia, without long-term current use of insulin ([name])  Acute metabolic encephalopathy  Dyslipidemia, goal LDL below 70 28 minutes of non-face-to-face time spent obtaining history documenting the note complex medical decision making providing a new letter for the patient Follow Up Instructions: The patient spouse knows an appointment be made for the patient within the next month face-to-face visit   I discussed the assessment and treatment plan with the patient. The patient was provided an opportunity to ask questions and all were answered. The patient agreed with the plan and demonstrated an understanding of the instructions.   The patient was advised to call back or seek an in-person evaluation if the symptoms worsen or if the condition fails to improve as anticipated.  I provided 28 minutes of non-face-to-face time during this encounter  including  median intraservice time , review of notes, labs, imaging, medications  and explaining diagnosis and management to the patient .    Asencion Noble, MD

## 2021-02-24 NOTE — Assessment & Plan Note (Signed)
By history appears to be resolving we will bring patient in for direct exam to follow-up x-rays

## 2021-03-01 ENCOUNTER — Encounter: Payer: Self-pay | Admitting: Speech Pathology

## 2021-03-01 ENCOUNTER — Ambulatory Visit: Payer: Medicare Other | Admitting: Physical Therapy

## 2021-03-01 ENCOUNTER — Other Ambulatory Visit: Payer: Self-pay

## 2021-03-01 ENCOUNTER — Encounter: Payer: Self-pay | Admitting: Physical Therapy

## 2021-03-01 ENCOUNTER — Ambulatory Visit: Payer: Medicare Other | Admitting: Speech Pathology

## 2021-03-01 DIAGNOSIS — R4701 Aphasia: Secondary | ICD-10-CM | POA: Diagnosis not present

## 2021-03-01 DIAGNOSIS — I69319 Unspecified symptoms and signs involving cognitive functions following cerebral infarction: Secondary | ICD-10-CM | POA: Diagnosis not present

## 2021-03-01 DIAGNOSIS — R41841 Cognitive communication deficit: Secondary | ICD-10-CM

## 2021-03-01 DIAGNOSIS — R293 Abnormal posture: Secondary | ICD-10-CM | POA: Diagnosis not present

## 2021-03-01 DIAGNOSIS — R260 Ataxic gait: Secondary | ICD-10-CM

## 2021-03-01 DIAGNOSIS — R2689 Other abnormalities of gait and mobility: Secondary | ICD-10-CM | POA: Diagnosis not present

## 2021-03-01 DIAGNOSIS — R278 Other lack of coordination: Secondary | ICD-10-CM | POA: Diagnosis not present

## 2021-03-01 DIAGNOSIS — R2681 Unsteadiness on feet: Secondary | ICD-10-CM | POA: Diagnosis not present

## 2021-03-01 DIAGNOSIS — I6329 Cerebral infarction due to unspecified occlusion or stenosis of other precerebral arteries: Secondary | ICD-10-CM | POA: Diagnosis not present

## 2021-03-01 DIAGNOSIS — Z9181 History of falling: Secondary | ICD-10-CM | POA: Diagnosis not present

## 2021-03-01 DIAGNOSIS — M6281 Muscle weakness (generalized): Secondary | ICD-10-CM | POA: Diagnosis not present

## 2021-03-01 NOTE — Therapy (Signed)
[place]. [place], [place], [address] Phone: [phone]   Fax:  [phone]  Speech Language Pathology Treatment  Patient Details  Name: Joe WAGER Sr. MRN: [id number] Date of Birth: Jan 14, 1951 Referring Provider (SLP): Elsie Stain, MD   Encounter Date: 03/01/2021   End of Session - 03/01/21 1408     Visit Number 34    Number of Visits --   Asking for 16 visits x2/week for 8 weeks.   Date for SLP Re-Evaluation 03/22/21    Authorization Time Period 03/22/21    Authorization - Visit Number 9    Authorization - Number of Visits 45    SLP Start Time 1400    SLP Stop Time  1440    SLP Time Calculation (min) 40 min    Activity Tolerance Patient tolerated treatment well;Patient limited by fatigue             Past Medical History:  Diagnosis Date   Acute metabolic encephalopathy 8/58/8502   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 ([place]) 06/03/2019   Cerebrovascular accident (CVA) due to occlusion of vertebral artery ([name])    Diabetes mellitus without complication ([place])    Dysphagia    post stroke   Hypertension    Stroke [facility])     Past Surgical History:  Procedure Laterality Date   IR ANGIO INTRA EXTRACRAN SEL COM CAROTID INNOMINATE UNI L MOD SED  03/10/2019   IR ANGIO VERTEBRAL SEL SUBCLAVIAN INNOMINATE BILAT MOD SED  03/10/2019   IR CT HEAD LTD  03/10/2019   IR PERCUTANEOUS ART THROMBECTOMY/INFUSION INTRACRANIAL INC DIAG ANGIO  03/10/2019   RADIOLOGY WITH ANESTHESIA N/A 03/10/2019   Procedure: IR WITH ANESTHESIA/CODE STROKE;  Surgeon: Radiologist, Medication, MD;  Location: [facility];  Service: Radiology;  Laterality: N/A;    There were no vitals filed for this visit.          ADULT SLP TREATMENT - 03/01/21 1429       General Information   Behavior/Cognition Alert;Cooperative      Treatment Provided   Treatment provided Cognitive-Linquistic       Cognitive-Linquistic Treatment   Treatment focused on Aphasia;Cognition    Skilled Treatment Facilitated verbal expression by having pt answer [place]- questions (100%). Facilitated reading comprehension by having pt match words to pictures (3 MC options). Named 9 out of target 10.      Assessment / Recommendations / Plan   Plan Continue with current plan of care                SLP Short Term Goals - 03/01/21 1426       SLP SHORT TERM GOAL #1   Title Pt will name 6/10 objects independenty over 3 sessions to increase expressive communication.    Time 4    Period Weeks    Status On-going   Pt is able to make a choice verbally, but may not correctly identify.     SLP SHORT TERM GOAL #2   Title Pt will follow through with swallowing saliva before speaking at home as reported by caregiver.    Time 1    Period Weeks    Status Achieved      SLP SHORT TERM GOAL #3   Title Pt will demonstrate appropriate alternating attention between two tasks with <1 verbal/visual cue.    Time 4    Period Weeks    Status On-going  SLP SHORT TERM GOAL #4   Title Pt will solve basic problems in safety scenarios with minA.    Time 4    Period Weeks    Status New              SLP Long Term Goals - 03/01/21 1426       SLP LONG TERM GOAL #1   Title Client will utilize compensatory strategies to communicate wants and needs effectively to different conversational partners and participate socially in functional living environment.    Time 4    Period Weeks    Status On-going      SLP LONG TERM GOAL #2   Title Client will demonstrate understanding of verbal communication related to daily activities and utilize compensatory strategies to more effectively communicate in their functional living environment.    Time 44    Period Weeks    Status On-going      SLP LONG TERM GOAL #3   Title Patient will develop functional attention skills to effectively attend to and communicate in tasks  of daily living in their functional living environment.    Time 4    Period Weeks    Status On-going              Plan - 03/01/21 1438     Clinical Impression Statement Pt was alert for entire session. Will make goals for matching words to pictures.    Speech Therapy Frequency 2x / week    Duration 12 weeks    Treatment/Interventions Aspiration precaution training;Cueing hierarchy;Patient/family education;Functional tasks;Environmental controls;Cognitive reorganization;Multimodal communcation approach;Language facilitation;Compensatory techniques;Internal/external aids;SLP instruction and feedback;Compensatory strategies    Potential to Achieve Goals Fair    Potential Considerations Ability to learn/carryover information;Severity of impairments    Consulted and Agree with Plan of Care Patient;Family member/caregiver             Patient will benefit from skilled therapeutic intervention in order to improve the following deficits and impairments:   Aphasia  Cognitive communication deficit    Problem List Patient Active Problem List   Diagnosis Date Noted   Ankle edema, bilateral 02/16/2021   Left upper lobe pneumonia 02/15/2021   Left hemiparesis ([place]) 12/06/2020   Benign essential HTN    Cognitive deficit, post-stroke    Controlled type 2 diabetes mellitus with hyperglycemia, without long-term current use of insulin ([place])    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 [place], [place],  [address] Phone: [phone]   Fax:  [phone]   Name: Joe BEHRLE Sr. MRN: [id number] Date of Birth: November 22, 1950

## 2021-03-01 NOTE — Therapy (Signed)
[place]. [place], [place], [address] Phone: [phone]   Fax:  [phone]  Physical Therapy Treatment  Patient Details  Name: Joe DINI Sr. MRN: [id number] Date of Birth: 1951/04/25 Referring Provider (PT): Asencion Noble, MD   Encounter Date: 03/01/2021   PT End of Session - 03/01/21 1614     Visit Number 21    Date for PT Re-Evaluation 03/11/21    Authorization Type [employer] Medicare    PT Start Time 6962    PT Stop Time 9528    PT Time Calculation (min) 42 min    Activity Tolerance Patient tolerated treatment well;Patient limited by fatigue    Behavior During Therapy Flat affect             Past Medical History:  Diagnosis Date   Acute metabolic encephalopathy 12/16/2438   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 ([place]) 06/03/2019   Cerebrovascular accident (CVA) due to occlusion of vertebral artery ([name])    Diabetes mellitus without complication ([place])    Dysphagia    post stroke   Hypertension    Stroke [facility])     Past Surgical History:  Procedure Laterality Date   IR ANGIO INTRA EXTRACRAN SEL COM CAROTID INNOMINATE UNI L MOD SED  03/10/2019   IR ANGIO VERTEBRAL SEL SUBCLAVIAN INNOMINATE BILAT MOD SED  03/10/2019   IR CT HEAD LTD  03/10/2019   IR PERCUTANEOUS ART THROMBECTOMY/INFUSION INTRACRANIAL INC DIAG ANGIO  03/10/2019   RADIOLOGY WITH ANESTHESIA N/A 03/10/2019   Procedure: IR WITH ANESTHESIA/CODE STROKE;  Surgeon: Radiologist, Medication, MD;  Location: [facility];  Service: Radiology;  Laterality: N/A;    There were no vitals filed for this visit.   Subjective Assessment - 03/01/21 1446     Subjective Spoke with his wife, reports that he is still slow and still stooped over    Currently in Pain? No/denies                               [facility] Adult PT Treatment/Exercise - 03/01/21 0001       Ambulation/Gait   Gait Comments  HHA x 120 feet cues for posture and step length      High Level Balance   High Level Balance Activities Side stepping    High Level Balance Comments seated ball catch and toss and then trying to bat it so he gets a little out of his BOS, seated ball kicks left and right and then progressively faster.  Standing back of right leg touching chair cone reach with opposite hand across body and then stacking      Knee/Hip Exercises: Aerobic   Tread Mill .5 mph x 2 minutes, took a few starts to get it right but then once going I was able to use my hands to help with posture as he walked    Nustep level 5 x 6 minutes UE and LE      Knee/Hip Exercises: Machines for Strengthening   Cybex Leg Press 20# 2x10 with both legs PT verbal and tactile help to not snap the left leg into extension, then left only with a lot of verbal cues to get the mms working and then some help to not snap the knee back, working on control, then tried left only without weight, very difficult to control this  PT Short Term Goals - 12/23/20 1748       PT SHORT TERM GOAL #1   Title Pt will perform HEP with wife's assistance, for improved strength, balance, gait.    Time 4    Period Weeks    Status On-going   wife reports difficulty with getting Mr Sao to do exercises at home              PT Long Term Goals - 02/08/21 1630       PT LONG TERM GOAL #1   Title Pt will perform progressive HEP with family supervision and assistance for improved balance, strength, gait.    Status On-going      PT LONG TERM GOAL #2   Title Pt will improve 5TSTS score to </= 20 seconds to demonstrate improved functional strength and decreased fall risk    Baseline Initial 54". 12/23/20: 35 seconds with arms of chair BUE support for push up. 02/08/21: 28Sec with BUE assist on mat to push up    Status On-going      PT LONG TERM GOAL #3   Title Pt will improve TUG score to less than or equal to 25 seconds for  decreased fall risk.    Baseline Initial 59 sec. 12/23/20: 55 seconds. 02/08/21: 44 seconds - most difficulty and time with turns    Status On-going                   Plan - 03/01/21 1615     Clinical Impression Statement I tried the tmill and leg press today, the tmill allowed me to have hands on him to helpw ith posture, it took a few times to get him to be able to do this, working on the leg press for left LE strength but a lot of cues and help to avoid the left knee to keep from snapping back.  He is moving slower and having more difficulty with posture since the hospital admission    PT Next Visit Plan continue to work on his awareness of posture and work on this, also look to see if we can get better control of the left knee with walking - possibly use ace wrap or TB wrapping technique to prevent hyperextension in stance during gait training    Consulted and Agree with Plan of Care Patient;Family member/caregiver    Family Member Consulted wife             Patient will benefit from skilled therapeutic intervention in order to improve the following deficits and impairments:  Abnormal gait, Decreased coordination, Difficulty walking, Decreased safety awareness, Decreased balance, Decreased strength, Decreased mobility  Visit Diagnosis: Muscle weakness (generalized)  Other abnormalities of gait and mobility  Abnormal posture  Ataxic gait  Unsteadiness on feet     Problem List Patient Active Problem List   Diagnosis Date Noted   Ankle edema, bilateral 02/16/2021   Left upper lobe pneumonia 02/15/2021   Left hemiparesis ([place]) 12/06/2020   Benign essential HTN    Cognitive deficit, post-stroke    Controlled type 2 diabetes mellitus with hyperglycemia, without long-term current use of insulin ([name])    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 [place], [place], [address] Phone: [phone]   Fax:  [phone]  Name: Joe NAWROT Sr. MRN: [id number] Date of Birth: Jun 04, 1951

## 2021-03-03 ENCOUNTER — Ambulatory Visit: Payer: Medicare Other | Admitting: Occupational Therapy

## 2021-03-03 ENCOUNTER — Other Ambulatory Visit: Payer: Self-pay

## 2021-03-03 ENCOUNTER — Ambulatory Visit: Payer: Medicare Other | Admitting: Speech Pathology

## 2021-03-03 ENCOUNTER — Encounter: Payer: Self-pay | Admitting: Speech Pathology

## 2021-03-03 DIAGNOSIS — I69319 Unspecified symptoms and signs involving cognitive functions following cerebral infarction: Secondary | ICD-10-CM | POA: Diagnosis not present

## 2021-03-03 DIAGNOSIS — R2689 Other abnormalities of gait and mobility: Secondary | ICD-10-CM | POA: Diagnosis not present

## 2021-03-03 DIAGNOSIS — I6329 Cerebral infarction due to unspecified occlusion or stenosis of other precerebral arteries: Secondary | ICD-10-CM | POA: Diagnosis not present

## 2021-03-03 DIAGNOSIS — R4701 Aphasia: Secondary | ICD-10-CM

## 2021-03-03 DIAGNOSIS — R41841 Cognitive communication deficit: Secondary | ICD-10-CM

## 2021-03-03 DIAGNOSIS — Z9181 History of falling: Secondary | ICD-10-CM | POA: Diagnosis not present

## 2021-03-03 DIAGNOSIS — R260 Ataxic gait: Secondary | ICD-10-CM | POA: Diagnosis not present

## 2021-03-03 DIAGNOSIS — R41842 Visuospatial deficit: Secondary | ICD-10-CM

## 2021-03-03 DIAGNOSIS — M6281 Muscle weakness (generalized): Secondary | ICD-10-CM | POA: Diagnosis not present

## 2021-03-03 DIAGNOSIS — R2681 Unsteadiness on feet: Secondary | ICD-10-CM | POA: Diagnosis not present

## 2021-03-03 DIAGNOSIS — R278 Other lack of coordination: Secondary | ICD-10-CM

## 2021-03-03 DIAGNOSIS — R293 Abnormal posture: Secondary | ICD-10-CM | POA: Diagnosis not present

## 2021-03-03 NOTE — Therapy (Signed)
[place]. [place], [place], [address] Phone: [phone]   Fax:  [phone]  Speech Language Pathology Treatment  Patient Details  Name: Joe MCLINDEN Sr. MRN: [id number] Date of Birth: 06-22-1951 Referring Provider (SLP): Elsie Stain, MD   Encounter Date: 03/03/2021   End of Session - 03/03/21 1449     Visit Number 25    Number of Visits --   Asking for 16 visits x2/week for 8 weeks.   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 ([name])    Diabetes mellitus without complication ([place])    Dysphagia    post stroke   Hypertension    Stroke [facility])     Past Surgical History:  Procedure Laterality Date   IR ANGIO INTRA EXTRACRAN SEL COM CAROTID INNOMINATE UNI L MOD SED  03/10/2019   IR ANGIO VERTEBRAL SEL SUBCLAVIAN INNOMINATE BILAT MOD SED  03/10/2019   IR CT HEAD LTD  03/10/2019   IR PERCUTANEOUS ART THROMBECTOMY/INFUSION INTRACRANIAL INC DIAG ANGIO  03/10/2019   RADIOLOGY WITH ANESTHESIA N/A 03/10/2019   Procedure: IR WITH ANESTHESIA/CODE STROKE;  Surgeon: Radiologist, Medication, MD;  Location: [facility];  Service: Radiology;  Laterality: N/A;    There were no vitals filed for this visit.   Subjective Assessment - 03/03/21 1401     Subjective Pt came from OT. OT reported he conversed with her in tx.    Currently in Pain? No/denies                   ADULT SLP  TREATMENT - 03/03/21 1421       General Information   Behavior/Cognition Cooperative;Lethargic      Treatment Provided   Treatment provided Cognitive-Linquistic      Cognitive-Linquistic Treatment   Treatment focused on Aphasia;Cognition    Skilled Treatment Pt demonstrated decreased sustained attention this session - difficulty keeping eyes open during session. OT reported similar tiredness her in session. Pt was able to ID and match single words with 90% acc minA. Required modifications (folding paper so one question was available at a time.      Progression Toward Goals   Progression toward goals Progressing toward goals              SLP Education - 03/03/21 1449     Education Details Provided HEP    Person(s) Educated Patient;Spouse              SLP Short Term Goals - 03/03/21 1451       SLP SHORT TERM GOAL #1   Title Pt will name 6/10 objects independenty over 3 sessions to increase expressive communication.    Time 3    Period Weeks    Status On-going   Pt is able to make a choice verbally, but may not correctly identify.  SLP SHORT TERM GOAL #2   Title Pt will follow through with swallowing saliva before speaking at home as reported by caregiver.    Time 1    Period Weeks    Status Achieved      SLP SHORT TERM GOAL #3   Title Pt will demonstrate appropriate alternating attention between two tasks with <1 verbal/visual cue.    Time 3    Period Weeks    Status On-going      SLP SHORT TERM GOAL #4   Title Pt will solve basic problems in safety scenarios with minA.    Time 3    Period Weeks    Status On-going              SLP Long Term Goals - 03/03/21 1452       SLP LONG TERM GOAL #1   Title Client will utilize compensatory strategies to communicate wants and needs effectively to different conversational partners and participate socially in functional living environment.    Time 3    Period Weeks    Status On-going      SLP LONG TERM  GOAL #2   Title Client will demonstrate understanding of verbal communication related to daily activities and utilize compensatory strategies to more effectively communicate in their functional living environment.    Time 3    Period Weeks    Status On-going      SLP LONG TERM GOAL #3   Title Patient will develop functional attention skills to effectively attend to and communicate in tasks of daily living in their functional living environment.    Time 3    Period Weeks    Status On-going              Plan - 03/03/21 1451     Clinical Impression Statement Difficulty with sustained attention. Pt was in and out during session. Created HEP packet for patient and wife to work on at home. Edu wife.    Speech Therapy Frequency 2x / week    Duration 12 weeks    Treatment/Interventions Aspiration precaution training;Cueing hierarchy;Patient/family education;Functional tasks;Environmental controls;Cognitive reorganization;Multimodal communcation approach;Language facilitation;Compensatory techniques;Internal/external aids;SLP instruction and feedback;Compensatory strategies    Potential to Achieve Goals Fair    Potential Considerations Ability to learn/carryover information;Severity of impairments    Consulted and Agree with Plan of Care Patient;Family member/caregiver             Patient will benefit from skilled therapeutic intervention in order to improve the following deficits and impairments:   Aphasia  Cognitive communication deficit    Problem List Patient Active Problem List   Diagnosis Date Noted   Ankle edema, bilateral 02/16/2021   Left upper lobe pneumonia 02/15/2021   Left hemiparesis ([place]) 12/06/2020   Benign essential HTN    Cognitive deficit, post-stroke    Controlled type 2 diabetes mellitus with hyperglycemia, without long-term current use of insulin ([place])    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 [place], [place], [address] Phone: [phone]  Fax:  [phone]   Name: Joe BRICCO Sr. MRN: [id number] Date of Birth: [birth year]

## 2021-03-04 NOTE — Therapy (Signed)
[address]. [place], [place], [address] Phone: [phone]   Fax:  [phone]  Occupational Therapy Treatment  Patient Details  Name: Joe SELLITTO Sr. MRN: [id number] Date of Birth: [birth year] Referring Provider (OT): Asencion Noble, MD   Encounter Date: 03/03/2021   OT End of Session - 03/03/21 1315     Visit Number 3    Number of Visits 13    Date for OT Re-Evaluation 05/11/21    Authorization Type UHC Medicare; BCBS state (secondary)    Authorization Time Period VL: [place]    Progress Note Due on Visit 10    OT Start Time 1315    OT Stop Time 1357    OT Time Calculation (min) 42 min    Activity Tolerance Patient tolerated treatment well    Behavior During Therapy Flat affect             Past Medical History:  Diagnosis Date   Acute metabolic encephalopathy 3/66/4403   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 ([place]) 06/03/2019   Cerebrovascular accident (CVA) due to occlusion of vertebral artery ([name])    Diabetes mellitus without complication ([place])    Dysphagia    post stroke   Hypertension    Stroke [facility])     Past Surgical History:  Procedure Laterality Date   IR ANGIO INTRA EXTRACRAN SEL COM CAROTID INNOMINATE UNI L MOD SED  03/10/2019   IR ANGIO VERTEBRAL SEL SUBCLAVIAN INNOMINATE BILAT MOD SED  03/10/2019   IR CT HEAD LTD  03/10/2019   IR PERCUTANEOUS ART THROMBECTOMY/INFUSION INTRACRANIAL INC DIAG ANGIO  03/10/2019   RADIOLOGY WITH ANESTHESIA N/A 03/10/2019   Procedure: IR WITH ANESTHESIA/CODE STROKE;  Surgeon: Radiologist, Medication, MD;  Location: [facility];  Service: Radiology;  Laterality: N/A;    There were no vitals filed for this visit.   Subjective Assessment - 03/03/21 1320     Subjective  Pt stated the weight-shifting exercise felt "good"    Patient is accompanied by: Family member   Wife [name]   Pertinent History Hx of CVA (2017, March  2020, July 2020); T2DM; HTN    Limitations Fall risk; L homonymous hemianopia (per chart review); dysphagia; cognitive-communication deficit    Patient Stated Goals Get stronger; improve walking; improve independence    Currently in Pain? No/denies             OT Treatment/Exercises - 03/03/21    Visual Scanning  Tabletop icon cancellation activity w/ pt identifying 8/10 icons w/ OT providing gestural/visual cue and occasional verbal cues to fully scan to L side during activity. With add'l verbal cueing, pt able to identify remaining 2/10 (both located on L-side of page)  OT attempted to grade activity up to find 2 icons simultaneously w/ pt demo'ing increased difficulty, only identifying 1 out of 2 targets and requiring max cueing to successfully find 15/18 targets; unable to find remaining 3 despite extended time and cues     Neuromuscular Reed Sitting EOM, pt reaching ipsilaterally w/ LUE to retrieve cones and stack them at floor level on contralateral side; pt demo'd mild decreased coordination w/ grasp and release  Pt then instructed to reach contralaterally w/ LUE to retrieve stacked cones on the floor and place them behind a target, reaching back to ipsilateral side; completed activity w/ 2 drops due to decreased coordination w/ grasp and release  Stacking blocks at tabletop height w/ L hand; notable intention  tremor, which increased w/ increased reach. OT incorporated wrist weight (2#) and supported tower base during 2nd attempt w/ improved success; 3# weight during 3rd attempt w/ pt able to complete activity successfully and w/out drops          OT Short Term Goals - 02/24/21 1616       OT SHORT TERM GOAL #1   Title Pt will be able to sequence through one BADL task (e.g., donning pants, washing UB) w/ SPV in at least 2 trials    Baseline Min A w/ BADL tasks    Time 6    Period Weeks    Status New    Target Date 03/25/21      OT SHORT TERM GOAL #2   Title Pt will improve  participation in functional FM tasks as evidenced by completing 9-HPT w/ L hand in less than 3 minutes    Baseline 9-HPT w/ R hand (1 min, 14 sec); L hand (>3 min)    Time 6    Period Weeks    Status On-going      OT SHORT TERM GOAL #3   Title Pt will perform simple visual scanning at table with at least 90% accuracy.    Baseline Visual-perceptual deficits    Time 6    Period Weeks    Status On-going      OT SHORT TERM GOAL #4   Title Pt will improve GM coordination during BADLs as evidenced by increasing Box and Blocks test by at least 4 blocks    Baseline Box and Blocks (30 seconds) w/ R hand (9); L hand (4)    Time 6    Period Weeks    Status On-going             OT Long Term Goals - 02/10/21 1445       OT LONG TERM GOAL #1   Title Pt will be able to participate in HEP for strength, coordination, and visual-perception w/ SPV    Baseline No HEP at this time    Time 12    Period Weeks    Status New    Target Date 05/06/21      OT LONG TERM GOAL #2   Title Pt will improve participation in functional FM tasks as evidenced by decreasing 9-HPT time w/ R, dominant hand by at least 14 seconds    Baseline 9-HPT w/ R hand (1 min, 14 sec); L hand (>3 min)    Time 12    Period Weeks    Status New      OT LONG TERM GOAL #3   Title Pt will be able to complete simple IADL task (e.g., washing plastic dish, folding towel) w/ SPV for safety in at least 2 trials    Baseline Very minimal participation in IADLs at this time    Time 12    Period Weeks    Status New      OT LONG TERM GOAL #4   Title Pt will demonstrate full BADL activity (dressing, bathing, toileting) w/ SPV for safety    Baseline Completing BADLs w/ Min A, per spouse report    Time 12    Period Weeks    Status New             Plan - 03/03/21 1315     Clinical Impression Statement OT continued to facilitate visual scanning this session w/ increased success compared w/ previous session; pt required  significant cueing,  extended time, and implementation of visual compensatory strategies to fully scan to L side, which does indicate some L inattention. to further address this, as well as [place], OT facilitated weight-shifting w/ multi-level reaching w/ LUE and pt was able to complete activity w/out difficulty attending to L side, potentially due to repetitive pattern and larger targets. Incorporation of add'l wrist weight also appeared to be beneficial at improved targeting and accuracy during block stacking activity.    OT Occupational Profile and History Detailed Assessment- Review of Records and additional review of physical, cognitive, psychosocial history related to current functional performance    Occupational performance deficits (Please refer to evaluation for details): ADL's;IADL's;Work;Leisure;Social Participation    Body Structure / Function / Physical Skills ADL;ROM;UE functional use;Balance;Decreased knowledge of use of DME;FMC;Mobility;Vestibular;Body mechanics;Dexterity;Gait;Sensation;Vision;GMC;Strength;Proprioception;IADL;Coordination    Cognitive Skills Attention;Problem Solve;Safety Awareness;Sequencing    Rehab Potential Good    Clinical Decision Making Several treatment options, min-mod task modification necessary    Comorbidities Affecting Occupational Performance: May have comorbidities impacting occupational performance    Modification or Assistance to Complete Evaluation  Min-Moderate modification of tasks or assist with assess necessary to complete eval    OT Frequency 1x / week    OT Duration 12 weeks    OT Treatment/Interventions Self-care/ADL training;Therapeutic exercise;Visual/perceptual remediation/compensation;Aquatic Therapy;Moist Heat;Cryotherapy;Neuromuscular education;Patient/family education;Fluidtherapy;Energy conservation;Therapist, nutritional;Therapeutic activities;Balance training;DME and/or AE instruction;Manual Therapy;Passive range of motion;Cognitive  remediation/compensation    Plan LUE weight-bearing/weight-shifting for increased awareness    Consulted and Agree with Plan of Care Patient;Family member/caregiver    Family Member Consulted Wife Transport planner)             Patient will benefit from skilled therapeutic intervention in order to improve the following deficits and impairments:   Body Structure / Function / Physical Skills: ADL, ROM, UE functional use, Balance, Decreased knowledge of use of DME, FMC, Mobility, Vestibular, Body mechanics, Dexterity, Gait, Sensation, Vision, GMC, Strength, Proprioception, IADL, Coordination Cognitive Skills: Attention, Problem Solve, Safety Awareness, Sequencing   Visit Diagnosis: Visuospatial deficit  Unspecified symptoms and signs involving cognitive functions following cerebral infarction  Other lack of coordination    Problem List Patient Active Problem List   Diagnosis Date Noted   Ankle edema, bilateral 02/16/2021   Left upper lobe pneumonia 02/15/2021   Left hemiparesis ([place]) 12/06/2020   Benign essential HTN    Cognitive deficit, post-stroke    Controlled type 2 diabetes mellitus with hyperglycemia, without long-term current use of insulin ([place])    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 [place], [place], [address] Phone: [phone]   Fax:  [phone]  Name: DIO GILLER Sr. MRN: [id number] Date of Birth: 08-31-1951

## 2021-03-13 NOTE — Progress Notes (Signed)
Subjective:    Patient ID: Joe Eddy Sr., male    DOB: February 24, 1951, 70 y.o.   MRN: [id number] History of Present Illness:   09/03/19 This is a 70 year old male here for post hospital follow-up and a face-to-face exam.  His last visit was a telephone visit in November with one of our APP's.  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  Patient also has type 2 diabetes on Metformin, dyslipidemia on atorvastatin with a goal of LDL less than 70, hypertension on amlodipine 5 mg daily, and urinary incontinence now using diapering supplies.  Note the patient has been seen by neurology and physical medicine and rehab however neither service had yet ordered any outpatient speech physical or occupational therapy.  He was seen briefly by home-based therapy with [facility].  He is here to establish further for primary care so that we can follow through on these needs.  Also the patient spouse complains that he sleeps a lot during the daytime but upon further review he is taking his Seroquel multiple times during the day instead of at bedtime therefore he is not sleeping at night and is sleeping during the day.  Note the patient is on Metformin and his blood glucoses have been well controlled  Blood pressures also been well controlled and today in the office blood pressure is 125/77  08/03/2020 Chronic cough 4 weeks This patient comes in today to establish for primary care as his other primary care physician has left the practice.  I did seen this patient previously earlier in the year.  History of hypertension which has been well controlled and on arrival he is 120/79.  Previous stroke involving right posterior communicating artery with thrombectomy removal of large clot and now with residual deficits of hemiparesis on the right upper extremity and significant dysphagia and trouble  swallowing.  He had been seeing speech pathology and they are requiring another modified barium swallow for this patient.  He is following with a nectar thick liquids and having his spouse watch him eat however she states he still coughs quite a bit when eating and has been to the emergency room several times for this.  Also the emergency room recently for bladder irritation found to have a E. coli UTI and was given a course of antibiotics.  Patient maintains his aspirin atorvastatin and Aricept but is now off Brilinta.  Patient is taking the Seroquel at bedtime this is cause less drowsiness during the daytime Patient also has diabetes is only taking 250 mg of Metformin daily on arrival blood sugar is 223 and hemoglobin A1c has increased to 6.7 on this visit  10/04/20 Patient is seen in return follow-up accompanied by his spouse who is his caregiver.  Spouse expresses frustration and she is having increased difficulty caring for this patient.  He does have a chronic cough and drooling from his previous stroke he cannot swallow he cannot talk at this time.  The patient does not had no falls at this time.  He has been compliant with all his medications.  On arrival blood pressure is 145/84 oxygen level is adequate blood glucose is 153  Patient has no other specific complaints other than the chronic cough at this time.  He was to be referred to speech pathology however the consult has not yet occurred Note due to his medical status he is not a candidate for colonoscopy  and Pneumovax is not indicated at this time  The patient's spouse is indicating a desire to look into long-term care options  12/06/2020 Patient seen in return follow-up doing fairly well.  Has seen physical therapy and speech therapy and is improving with swallowing speech and also muscle strength.  He is now using a walker instead of a wheelchair. On arrival blood pressure is 130/78.  His blood sugar was 58 he has been taking Metformin 1000 mg  twice daily A1c is 5.6   02/24/2021 This patient is seen by way of a telephone visit.  I conducted the visit with the patient's spouse as the patient is unable to communicate over the phone.  Patient was just in the hospital for left upper lobe pneumonia likely due to aspiration.  Patient still has dysphagia due to his prior stroke.  Patient now was discharged after 2-day hospitalization in improved status.  He is off oxygen off antibiotics.  He is going to outpatient physical therapy and speech-language pathology.  He is on a dysphagia diet and breathing is improved.  The wife is requesting a new letter to allow his sons from [place] come to visit him individually.  She wants more specificity to his diagnoses and the letter I will produce this.  Below is a copy of the discharge summary Admit date: 02/15/2021 Discharge date: 02/17/2021   Admitted From: Home Disposition: Home   Recommendations for Outpatient Follow-:  1. Follow up with PCP in 1 week   Discharge Condition: Stable CODE STATUS: Full code Diet recommendation: Heart healthy diet   Brief/Interim Summary: Joe CRUNKLETON Sr. is a 70 year old male with past medical history significant for previous strokes with residual aphasia and hemiparesis, diabetes mellitus, hypertension, dysphagia, cognitive deficits.  Apparently his aphasia gets worse whenever he gets ill with an infection.  Per family, EMS was called due to worsening left-sided weakness 4 days ago, patient developed a fever day before admission.  EMS evaluated patient, but patient was not brought to the ED at that time.  Patient now presenting with fever, work-up revealed suspicion for pneumonia in the left upper and left lower lobe.  Treated with empiric Rocephin, azithromycin.  Sepsis continue to improve.  Patient was evaluated by speech-language pathology, placed on dysphagia diet.  He remained fever free, on room air and was discharged home on oral antibiotics.   Discharge  Diagnoses:  Principal Problem:   Left upper lobe pneumonia Active Problems:   Dysphagia due to recent stroke   Controlled type 2 diabetes mellitus with hyperglycemia, without long-term current use of insulin ([name])   Cognitive deficit, post-stroke   Benign essential HTN   Left hemiparesis ([name])   Acute metabolic encephalopathy     Sepsis secondary to CAP, with concern for aspiration pneumonia -Presented with fever of 101.5, tachycardia 104 -Blood cultures negative to date -Continue Rocephin, azithromycin -SLP evaluation recommended dysphagia diet   Acute metabolic encephalopathy with underlying dementia -Secondary to above infection -Improved -Continue home Seroquel, Aricept  Diabetes mellitus type 2 -Continue SSI   Hypertension -Continue Norvasc   History of stroke with residual left-sided hemiparesis, cognitive deficit, dysphagia, aphasia -PT OT recommended home health -Continue aspirin, Lipitor  03/14/2021 's patient is seen in return follow-up and is having still cough but no longer having fever and not dyspneic.  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 ([name])    Diabetes mellitus without complication ([place])    Dysphagia    post stroke   Hypertension    Left upper lobe pneumonia 02/15/2021   Stroke [facility])      Family History  Problem Relation Age of Onset   Diabetes Other    Hypertension Other    Healthy Mother    Healthy Father      Social History   Socioeconomic History   Marital status: Married    Spouse name: [name]   Number of children: Not on file   Years of education: Not on file   Highest education level: Not on file  Occupational History    Not on file  Tobacco Use   Smoking status: Never   Smokeless tobacco: Never  Vaping Use   Vaping Use: Never used  Substance and Sexual Activity   Alcohol use: No   Drug use: No   Sexual activity: Not Currently  Other Topics Concern   Not on file  Social History Narrative   12/16/19 Lives with wife   Social Determinants of Health   Financial Resource Strain: Not on file  Food Insecurity: Not on file  Transportation Needs: Not on file  Physical Activity: Not on file  Stress: Not on file  Social Connections: Not on file  Intimate Partner Violence: Not on file     No Known Allergies   Outpatient Medications Prior to Visit  Medication Sig Dispense Refill   acetaminophen (TYLENOL) 325 MG tablet Take 2 tablets (650 mg total) by mouth every 4 (four) hours as needed for mild pain (or temp > 37.5 C (99.5 F)).     amLODipine (NORVASC) 5 MG tablet Take 1 tablet (5 mg total) by mouth daily. To lower blood pressure 90 tablet 2   aspirin EC 81 MG tablet Take 1 tablet (81 mg total) by mouth daily. 100 tablet 3   atorvastatin (LIPITOR) 40 MG tablet One daily in the evening to lower cholesterol 90 tablet 2   cetirizine (ZYRTEC) 10 MG tablet Take 1 tablet (10 mg total) by mouth daily. 30 tablet 11   donepezil (ARICEPT) 10 MG tablet TAKE 1 TABLET BY MOUTH AT BEDTIME 90 tablet 0   QUEtiapine (SEROQUEL) 25 MG tablet Take 2 tablets (50 mg total) by mouth at bedtime. 60 tablet 3   promethazine-dextromethorphan (PROMETHAZINE-DM) 6.25-15 MG/5ML syrup Take 5 mLs by mouth 4 (four) times daily as needed for cough. 118 mL 0   metFORMIN (GLUCOPHAGE) 500 MG tablet Take 1 tablet (500 mg total) by mouth 2 (two) times daily with a meal. 120 tablet 1   No facility-administered medications prior to visit.      Review of Systems Constitutional:   No  weight loss, night sweats,  Fevers, chills, fatigue, lassitude. HEENT:    headaches,  Tooth/dental problems,  Sore throat,                No sneezing,  itching, ear ache, nasal congestion, post nasal drip,   CV:  No chest pain,  Orthopnea, PND, swelling in lower extremities, anasarca, dizziness, palpitations  GI  No heartburn, indigestion, abdominal pain, nausea, vomiting, diarrhea, change in bowel habits, loss of appetite  Resp: No shortness of breath with exertion or at rest.  No excess mucus, no productive cough,   No cough,  No coughing up of  blood.  No change in color of mucus.  No wheezing.  No chest wall deformity  Skin: no rash or lesions.  GU: no dysuria, change in color of urine, no urgency or frequency.  No flank pain.  MS:  No joint pain or swelling.  No decreased range of motion.  No back pain.  Psych:   change in mood or affect.  depression or anxiety.   memory loss.     Objective:   Physical Exam   Vitals:   03/14/21 1445 03/14/21 1500  BP: (!) 154/87 140/83  Pulse: 64 64  SpO2: 98%   Weight: 168 lb (76.2 kg)   Height: 6' (1.829 m)     Gen: , in no distress,  normal affect  ENT: No lesions,  mouth clear,  oropharynx clear, no postnasal drip  Neck: No JVD, no TMG, no carotid bruits  Lungs: No use of accessory muscles, no dullness to percussion, clear without rales or rhonchi  Cardiovascular: RRR, heart sounds normal, no murmur or gallops, no peripheral edema  Abdomen: soft and NT, no HSM,  BS normal  Musculoskeletal: No deformities, no cyanosis or clubbing  Neuro: alert, weak Left> right, aphasic  Skin: Warm, no lesions or rashes       Assessment & Plan:  I personally reviewed all images and lab data in the [facility] system as well as any outside material available during this office visit and agree with the  radiology impressions.   Left upper lobe pneumonia Aspiration pneumonia has resolved  Dysphagia due to recent stroke Likely because of recent pneumonia continue modified diet  Cognitive deficit, post-stroke Referral for personal care services twice a week  Gait disturbance, post-stroke Has  completed maximum physical therapy  Benign essential HTN No change in blood pressure medication   Daily was seen today for cough.  Diagnoses and all orders for this visit:  Pneumonia of left upper lobe due to infectious organism  Dysphagia due to recent stroke  Cognitive deficit, post-stroke  Gait disturbance, post-stroke  Benign essential HTN  Other orders -     promethazine-dextromethorphan (PROMETHAZINE-DM) 6.25-15 MG/5ML syrup; Take 5 mLs by mouth 4 (four) times daily as needed for cough.

## 2021-03-14 ENCOUNTER — Ambulatory Visit: Payer: Medicare Other | Attending: Critical Care Medicine | Admitting: Critical Care Medicine

## 2021-03-14 ENCOUNTER — Other Ambulatory Visit: Payer: Self-pay

## 2021-03-14 ENCOUNTER — Encounter: Payer: Self-pay | Admitting: Critical Care Medicine

## 2021-03-14 VITALS — BP 140/83 | HR 64 | Ht 72.0 in | Wt 168.0 lb

## 2021-03-14 DIAGNOSIS — I10 Essential (primary) hypertension: Secondary | ICD-10-CM

## 2021-03-14 DIAGNOSIS — I69398 Other sequelae of cerebral infarction: Secondary | ICD-10-CM | POA: Diagnosis not present

## 2021-03-14 DIAGNOSIS — I69391 Dysphagia following cerebral infarction: Secondary | ICD-10-CM

## 2021-03-14 DIAGNOSIS — I69319 Unspecified symptoms and signs involving cognitive functions following cerebral infarction: Secondary | ICD-10-CM | POA: Diagnosis not present

## 2021-03-14 DIAGNOSIS — R269 Unspecified abnormalities of gait and mobility: Secondary | ICD-10-CM

## 2021-03-14 DIAGNOSIS — J189 Pneumonia, unspecified organism: Secondary | ICD-10-CM

## 2021-03-14 MED ORDER — PROMETHAZINE-DM 6.25-15 MG/5ML PO SYRP: 5.0000 mL | ORAL_SOLUTION | Freq: Four times a day (QID) | ORAL | 0 refills | Status: DC | PRN

## 2021-03-14 NOTE — Assessment & Plan Note (Signed)
No change in blood pressure medication

## 2021-03-14 NOTE — Assessment & Plan Note (Signed)
Aspiration pneumonia has resolved

## 2021-03-14 NOTE — Assessment & Plan Note (Signed)
Has completed maximum physical therapy

## 2021-03-14 NOTE — Patient Instructions (Addendum)
Refills on cough medicine sent to the pharmacy at [place]  No change in other medications  We will inquire to see if personal care assistant will be covered by your insurance twice a week  Return to see Dr. Joya Gaskins 2 months

## 2021-03-14 NOTE — Assessment & Plan Note (Signed)
Likely because of recent pneumonia continue modified diet

## 2021-03-14 NOTE — Assessment & Plan Note (Signed)
Referral for personal care services twice a week

## 2021-03-17 ENCOUNTER — Telehealth: Payer: Self-pay | Admitting: Critical Care Medicine

## 2021-03-17 DIAGNOSIS — E1165 Type 2 diabetes mellitus with hyperglycemia: Secondary | ICD-10-CM

## 2021-03-17 DIAGNOSIS — E11630 Type 2 diabetes mellitus with periodontal disease: Secondary | ICD-10-CM

## 2021-03-17 NOTE — Telephone Encounter (Signed)
Pt needs EYE MD and dental care  Alinda Sierras see referrals has insurance

## 2021-03-18 NOTE — Telephone Encounter (Signed)
Sorry do not understand your msg

## 2021-03-21 ENCOUNTER — Encounter: Payer: Self-pay | Admitting: Speech Pathology

## 2021-03-21 ENCOUNTER — Ambulatory Visit: Payer: Medicare Other | Admitting: Speech Pathology

## 2021-03-21 ENCOUNTER — Other Ambulatory Visit: Payer: Self-pay

## 2021-03-21 ENCOUNTER — Ambulatory Visit: Payer: Medicare Other | Attending: Critical Care Medicine | Admitting: Occupational Therapy

## 2021-03-21 DIAGNOSIS — M6281 Muscle weakness (generalized): Secondary | ICD-10-CM | POA: Diagnosis not present

## 2021-03-21 DIAGNOSIS — I69319 Unspecified symptoms and signs involving cognitive functions following cerebral infarction: Secondary | ICD-10-CM

## 2021-03-21 DIAGNOSIS — R41841 Cognitive communication deficit: Secondary | ICD-10-CM | POA: Diagnosis present

## 2021-03-21 DIAGNOSIS — R4701 Aphasia: Secondary | ICD-10-CM | POA: Diagnosis not present

## 2021-03-21 DIAGNOSIS — R2681 Unsteadiness on feet: Secondary | ICD-10-CM | POA: Diagnosis not present

## 2021-03-21 DIAGNOSIS — R2689 Other abnormalities of gait and mobility: Secondary | ICD-10-CM

## 2021-03-21 DIAGNOSIS — R41844 Frontal lobe and executive function deficit: Secondary | ICD-10-CM | POA: Insufficient documentation

## 2021-03-21 DIAGNOSIS — R278 Other lack of coordination: Secondary | ICD-10-CM | POA: Diagnosis not present

## 2021-03-21 DIAGNOSIS — R293 Abnormal posture: Secondary | ICD-10-CM | POA: Insufficient documentation

## 2021-03-21 DIAGNOSIS — R41842 Visuospatial deficit: Secondary | ICD-10-CM

## 2021-03-21 DIAGNOSIS — R260 Ataxic gait: Secondary | ICD-10-CM | POA: Insufficient documentation

## 2021-03-21 NOTE — Therapy (Signed)
[place]. [place], [place], [address] Phone: [phone]   Fax:  [phone]  Occupational Therapy Treatment  Patient Details  Name: Joe THUNE Sr. MRN: [id number] Date of Birth: [birth year] Referring Provider (OT): Asencion Noble, MD   Encounter Date: 03/21/2021   OT End of Session - 03/21/21 1033     Visit Number 4    Number of Visits 13    Date for OT Re-Evaluation 05/11/21    Authorization Type UHC Medicare; BCBS state (secondary)    Authorization Time Period VL: [place]    Progress Note Due on Visit 10    OT Start Time 1015    OT Stop Time 1100    OT Time Calculation (min) 45 min    Activity Tolerance Patient tolerated treatment well    Behavior During Therapy Flat affect             Past Medical History:  Diagnosis Date   Acute metabolic encephalopathy 02/23/2978   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 ([place]) 06/03/2019   Cerebrovascular accident (CVA) due to occlusion of vertebral artery ([name])    Diabetes mellitus without complication ([place])    Dysphagia    post stroke   Hypertension    Left upper lobe pneumonia 02/15/2021   Stroke [facility])     Past Surgical History:  Procedure Laterality Date   IR ANGIO INTRA EXTRACRAN SEL COM CAROTID INNOMINATE UNI L MOD SED  03/10/2019   IR ANGIO VERTEBRAL SEL SUBCLAVIAN INNOMINATE BILAT MOD SED  03/10/2019   IR CT HEAD LTD  03/10/2019   IR PERCUTANEOUS ART THROMBECTOMY/INFUSION INTRACRANIAL INC DIAG ANGIO  03/10/2019   RADIOLOGY WITH ANESTHESIA N/A 03/10/2019   Procedure: IR WITH ANESTHESIA/CODE STROKE;  Surgeon: Radiologist, Medication, MD;  Location: [facility];  Service: Radiology;  Laterality: N/A;    There were no vitals filed for this visit.   Subjective Assessment - 03/21/21 1025     Subjective  Pt reported he could not tell that he was leaning to the R side (during ambulation or when sitting)    Patient is  accompanied by: Family member   Wife Joe Perry)   Pertinent History Hx of CVA (2017, March 2020, July 2020); T2DM; HTN    Limitations Fall risk; L homonymous hemianopia (per chart review); dysphagia; cognitive-communication deficit    Patient Stated Goals Get stronger; improve walking; improve independence    Currently in Pain? No/denies             OT Treatment/Exercses - 03/21/21 1140     Neuromuscular Reed  Ambulation into session w/ RW; OT provided mod gestural and verbal cues to return to midline due to observed R-sided lean  Sitting EOM, pt completed weight shifting to L side to retrieve therapy cones from floor level using contralateral RUE. Completed 2 sets w/ weight through L hand and 2 sets w/ weight through L forearm; required cueing for initial positioning. Tall mirror positioned in front of pt as a visual aid/cue for trunk to midline. Activity used to facilitate proprioceptive input and kinesthetic sense  To address balance, kinesthetic sense, and L-sided inattention, pt instructed to tap targets at overhead, eye-level, chest, and waist height using weighted ball in L hand while standing at RW; standing at [facility]. Pt required max cues (verbal and visual) to located targets, particularly on L side.    Visual Scanning Tabletop icon cancellation activity w/ pt successfully identifying 9/10 targets  w/ verbal cues (I.e., "3 more") and remaining 1/10 w/ max cueing. In 2nd attempt w/ novel icons, pt identified 9 independently, requiring min verbal/visual cues for remaining 2         OT Short Term Goals - 02/24/21 1616       OT SHORT TERM GOAL #1   Title Pt will be able to sequence through one BADL task (e.g., donning pants, washing UB) w/ SPV in at least 2 trials    Baseline Min A w/ BADL tasks    Time 6    Period Weeks    Status New    Target Date 03/25/21      OT SHORT TERM GOAL #2   Title Pt will improve participation in functional FM tasks as evidenced by completing 9-HPT w/ L  hand in less than 3 minutes    Baseline 9-HPT w/ R hand (1 min, 14 sec); L hand (>3 min)    Time 6    Period Weeks    Status On-going      OT SHORT TERM GOAL #3   Title Pt will perform simple visual scanning at table with at least 90% accuracy.    Baseline Visual-perceptual deficits    Time 6    Period Weeks    Status On-going      OT SHORT TERM GOAL #4   Title Pt will improve GM coordination during BADLs as evidenced by increasing Box and Blocks test by at least 4 blocks    Baseline Box and Blocks (30 seconds) w/ R hand (9); L hand (4)    Time 6    Period Weeks    Status On-going             OT Long Term Goals - 03/21/21 1134       OT LONG TERM GOAL #1   Title Pt will be able to participate in HEP for strength, coordination, and visual-perception w/ SPV    Baseline No HEP at this time    Time 12    Period Weeks    Status On-going      OT LONG TERM GOAL #2   Title Pt will improve participation in functional FM tasks as evidenced by decreasing 9-HPT time w/ R, dominant hand by at least 14 seconds    Baseline 9-HPT w/ R hand (1 min, 14 sec); L hand (>3 min)    Time 12    Period Weeks    Status On-going      OT LONG TERM GOAL #3   Title Pt will be able to complete simple IADL task (e.g., washing plastic dish, folding towel) w/ SPV for safety in at least 2 trials    Baseline Very minimal participation in IADLs at this time    Time 12    Period Weeks    Status On-going      OT LONG TERM GOAL #4   Title Pt will demonstrate full BADL activity (dressing, bathing, toileting) w/ SPV for safety    Baseline Completing BADLs w/ Min A, per spouse report    Time 12    Period Weeks    Status On-going             Plan - 03/21/21 1130     Clinical Impression Statement When ambulating into session, OT observed increased R-sided leaning and decreased awareness of environment obstacles (walls, etc). Due to this, OT facilitated neuro reed for kinesthetic sense,  proprioception, and balance while sitting EOM  as well as while standing. OT utilized verbal/tactile cues and positioned tall mirror in front of pt during exercises to serve as visual aid/cue to return to midline w/ good results.    OT Occupational Profile and History Detailed Assessment- Review of Records and additional review of physical, cognitive, psychosocial history related to current functional performance    Occupational performance deficits (Please refer to evaluation for details): ADL's;IADL's;Work;Leisure;Social Participation    Body Structure / Function / Physical Skills ADL;ROM;UE functional use;Balance;Decreased knowledge of use of DME;FMC;Mobility;Vestibular;Body mechanics;Dexterity;Gait;Sensation;Vision;GMC;Strength;Proprioception;IADL;Coordination    Cognitive Skills Attention;Problem Solve;Safety Awareness;Sequencing    Rehab Potential Good    Clinical Decision Making Several treatment options, min-mod task modification necessary    Comorbidities Affecting Occupational Performance: May have comorbidities impacting occupational performance    Modification or Assistance to Complete Evaluation  Min-Moderate modification of tasks or assist with assess necessary to complete eval    OT Frequency 1x / week    OT Duration 12 weeks    OT Treatment/Interventions Self-care/ADL training;Therapeutic exercise;Visual/perceptual remediation/compensation;Aquatic Therapy;Moist Heat;Cryotherapy;Neuromuscular education;Patient/family education;Fluidtherapy;Energy conservation;Therapist, nutritional;Therapeutic activities;Balance training;DME and/or AE instruction;Manual Therapy;Passive range of motion;Cognitive remediation/compensation    Plan LUE weight-bearing/weight-shifting for increased awareness    Consulted and Agree with Plan of Care Patient;Family member/caregiver    Family Member Consulted Wife Transport planner)             Patient will benefit from skilled therapeutic intervention in order  to improve the following deficits and impairments:   Body Structure / Function / Physical Skills: ADL, ROM, UE functional use, Balance, Decreased knowledge of use of DME, FMC, Mobility, Vestibular, Body mechanics, Dexterity, Gait, Sensation, Vision, GMC, Strength, Proprioception, IADL, Coordination Cognitive Skills: Attention, Problem Solve, Safety Awareness, Sequencing   Visit Diagnosis: Other abnormalities of gait and mobility  Other lack of coordination  Visuospatial deficit  Unspecified symptoms and signs involving cognitive functions following cerebral infarction  Muscle weakness (generalized)    Problem List Patient Active Problem List   Diagnosis Date Noted   Ankle edema, bilateral 02/16/2021   Left hemiparesis ([place]) 12/06/2020   Benign essential HTN    Cognitive deficit, post-stroke    Controlled type 2 diabetes mellitus with hyperglycemia, without long-term current use of insulin ([name])    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 [place], [place], [address] Phone: [phone]   Fax:  [phone]  Name: Joe BARTOLO Sr. MRN: [id number] Date of Birth: 07/05/1951

## 2021-03-21 NOTE — Therapy (Signed)
[place]. [place], [place], [address] Phone: [phone]   Fax:  [phone]  Speech Language Pathology Treatment  Patient Details  Name: Joe DITTUS Sr. MRN: [id number] Date of Birth: 1950/11/24 Referring Provider (SLP): Elsie Stain, MD   Encounter Date: 03/21/2021   End of Session - 03/21/21 1355     Visit Number 26    Number of Visits --   Asking for 16 visits x2/week for 8 weeks.   Date for SLP Re-Evaluation 03/22/21    Authorization Time Period 03/22/21    Authorization - Visit Number 11    Authorization - Number of Visits 60    SLP Start Time 1100    SLP Stop Time  7494    SLP Time Calculation (min) 35 min    Activity Tolerance Patient limited by fatigue;Patient limited by lethargy             Past Medical History:  Diagnosis Date   Acute metabolic encephalopathy 4/96/7591   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 ([place]) 06/03/2019   Cerebrovascular accident (CVA) due to occlusion of vertebral artery ([name])    Diabetes mellitus without complication ([place])    Dysphagia    post stroke   Hypertension    Left upper lobe pneumonia 02/15/2021   Stroke [facility])     Past Surgical History:  Procedure Laterality Date   IR ANGIO INTRA EXTRACRAN SEL COM CAROTID INNOMINATE UNI L MOD SED  03/10/2019   IR ANGIO VERTEBRAL SEL SUBCLAVIAN INNOMINATE BILAT MOD SED  03/10/2019   IR CT HEAD LTD  03/10/2019   IR PERCUTANEOUS ART THROMBECTOMY/INFUSION INTRACRANIAL INC DIAG ANGIO  03/10/2019   RADIOLOGY WITH ANESTHESIA N/A 03/10/2019   Procedure: IR WITH ANESTHESIA/CODE STROKE;  Surgeon: Radiologist, Medication, MD;  Location: [facility];  Service: Radiology;  Laterality: N/A;    There were no vitals filed for this visit.   Subjective Assessment - 03/21/21 1107     Subjective Pt has not been seen for a couple of weeks due to possible miscommunication with scheduling. Pt was  not alert and needed more assistance with transfering from walker to chair than typical.    Currently in Pain? No/denies                   ADULT SLP TREATMENT - 03/21/21 1125       General Information   Behavior/Cognition Cooperative;Lethargic      Treatment Provided   Treatment provided Cognitive-Linquistic      Cognitive-Linquistic Treatment   Treatment focused on Aphasia;Cognition    Skilled Treatment Pt demonstrated difficulty with sustained attention today. Also noted by OT. Pt required consistent cueing throughout session to complete a single task. Pt would try to answer question and fall asleep. Ended session early due to lack of sustained attention.Unable to reassess this date.      Assessment / Recommendations / Plan   Plan Continue with current plan of care      Progression Toward Goals   Progression toward goals Progressing toward goals                SLP Short Term Goals - 03/21/21 1359       SLP SHORT TERM GOAL #1   Title Pt will name 6/10 objects independenty over 3 sessions to increase expressive communication.    Time 2    Period Weeks    Status Partially Met  Pt is able to make a choice verbally, but may not correctly identify.   Target Date 04/04/21      SLP SHORT TERM GOAL #2   Title Pt will follow through with swallowing saliva before speaking at home as reported by caregiver.    Period Weeks    Status Achieved      SLP SHORT TERM GOAL #3   Title Pt will demonstrate appropriate alternating attention between two tasks with <1 verbal/visual cue.    Time 2    Period Weeks    Status Partially Met    Target Date 04/04/21      SLP SHORT TERM GOAL #4   Title Pt will solve basic problems in safety scenarios with minA.    Time 2    Period Weeks    Status On-going    Target Date 04/04/21              SLP Long Term Goals - 03/21/21 1401       SLP LONG TERM GOAL #1   Title Client will utilize compensatory strategies to  communicate wants and needs effectively to different conversational partners and participate socially in functional living environment.    Time 4    Period Weeks    Status On-going    Target Date 04/21/21      SLP LONG TERM GOAL #2   Title Client will demonstrate understanding of verbal communication related to daily activities and utilize compensatory strategies to more effectively communicate in their functional living environment.    Time 4    Period Weeks    Status On-going    Target Date 04/21/21      SLP LONG TERM GOAL #3   Title Patient will develop functional attention skills to effectively attend to and communicate in tasks of daily living in their functional living environment.    Time 4    Period Weeks    Status On-going    Target Date 04/21/21              Plan - 03/21/21 1358     Clinical Impression Statement Difficulty with sustained attention. SLP unable to assess this session due to lethargy/fatigue. Pt requires continued services to assist with expressive communication.    Speech Therapy Frequency 2x / week    Duration 12 weeks    Treatment/Interventions Aspiration precaution training;Cueing hierarchy;Patient/family education;Functional tasks;Environmental controls;Cognitive reorganization;Multimodal communcation approach;Language facilitation;Compensatory techniques;Internal/external aids;SLP instruction and feedback;Compensatory strategies    Potential to Achieve Goals Fair    Potential Considerations Ability to learn/carryover information;Severity of impairments    Consulted and Agree with Plan of Care Patient;Family member/caregiver             Patient will benefit from skilled therapeutic intervention in order to improve the following deficits and impairments:   Aphasia  Cognitive communication deficit    Problem List Patient Active Problem List   Diagnosis Date Noted   Ankle edema, bilateral 02/16/2021   Left hemiparesis ([place]) 12/06/2020    Benign essential HTN    Cognitive deficit, post-stroke    Controlled type 2 diabetes mellitus with hyperglycemia, without long-term current use of insulin ([place])    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 Unable to fully assess this date.  Objective: See tx note.  Goal Update: See tx note.  Plan: SLP to see pt for an additional month, as patient was improving towards goals prior to today.   Reason Skilled Services are Required: SLP rec skilled speech services to maximize communication for needs and wants. SLP to see an additional month to maximize therapeutic gains.    Rosann Auerbach Gibson MS, [place], [name]  03/21/2021, 2:26 PM  [place]. [place], [place], [address] Phone: [phone]   Fax:  [phone]   Name: Joe MARKOS Sr. MRN: [id number] Date of Birth: Dec 04, 1950

## 2021-03-23 ENCOUNTER — Encounter: Payer: Self-pay | Admitting: Physical Therapy

## 2021-03-23 ENCOUNTER — Other Ambulatory Visit: Payer: Self-pay

## 2021-03-23 ENCOUNTER — Ambulatory Visit: Payer: Medicare Other | Admitting: Physical Therapy

## 2021-03-23 ENCOUNTER — Ambulatory Visit: Payer: Medicare Other | Admitting: Speech Pathology

## 2021-03-23 ENCOUNTER — Encounter: Payer: Medicare Other | Admitting: Occupational Therapy

## 2021-03-23 DIAGNOSIS — R2689 Other abnormalities of gait and mobility: Secondary | ICD-10-CM

## 2021-03-23 DIAGNOSIS — R4701 Aphasia: Secondary | ICD-10-CM | POA: Diagnosis not present

## 2021-03-23 DIAGNOSIS — R278 Other lack of coordination: Secondary | ICD-10-CM | POA: Diagnosis not present

## 2021-03-23 DIAGNOSIS — R293 Abnormal posture: Secondary | ICD-10-CM

## 2021-03-23 DIAGNOSIS — M6281 Muscle weakness (generalized): Secondary | ICD-10-CM | POA: Diagnosis not present

## 2021-03-23 DIAGNOSIS — R2681 Unsteadiness on feet: Secondary | ICD-10-CM | POA: Diagnosis not present

## 2021-03-23 DIAGNOSIS — I69319 Unspecified symptoms and signs involving cognitive functions following cerebral infarction: Secondary | ICD-10-CM | POA: Diagnosis not present

## 2021-03-23 DIAGNOSIS — R260 Ataxic gait: Secondary | ICD-10-CM | POA: Diagnosis not present

## 2021-03-23 NOTE — Therapy (Signed)
[place]. [place], [place], [address] Phone: [phone]   Fax:  [phone]  Physical Therapy Treatment  Patient Details  Name: Joe Perry. MRN: [id number] Date of Birth: 12/16/1950 Referring Provider (PT): Asencion Noble, MD   Encounter Date: 03/23/2021   PT End of Session - 03/23/21 1553     Visit Number 22    Date for PT Re-Evaluation 04/23/21    PT Start Time 1347    PT Stop Time 1430    PT Time Calculation (min) 43 min    Activity Tolerance Patient limited by fatigue    Behavior During Therapy Flat affect             Past Medical History:  Diagnosis Date   Acute metabolic encephalopathy 12/11/1446   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 ([place]) 06/03/2019   Cerebrovascular accident (CVA) due to occlusion of vertebral artery ([name])    Diabetes mellitus without complication ([place])    Dysphagia    post stroke   Hypertension    Left upper lobe pneumonia 02/15/2021   Stroke [facility])     Past Surgical History:  Procedure Laterality Date   IR ANGIO INTRA EXTRACRAN SEL COM CAROTID INNOMINATE UNI L MOD SED  03/10/2019   IR ANGIO VERTEBRAL SEL SUBCLAVIAN INNOMINATE BILAT MOD SED  03/10/2019   IR CT HEAD LTD  03/10/2019   IR PERCUTANEOUS ART THROMBECTOMY/INFUSION INTRACRANIAL INC DIAG ANGIO  03/10/2019   RADIOLOGY WITH ANESTHESIA N/A 03/10/2019   Procedure: IR WITH ANESTHESIA/CODE STROKE;  Surgeon: Radiologist, Medication, MD;  Location: [facility];  Service: Radiology;  Laterality: N/A;    There were no vitals filed for this visit.   Subjective Assessment - 03/23/21 1355     Subjective Pt wife reports no new changes, no new falls.    Currently in Pain? No/denies                [facility] PT Assessment - 03/23/21 0001       Standardized Balance Assessment   Five times sit to stand comments  36.62 sec, 33.22 sec with BUE support      Timed Up and Go Test    Normal TUG (seconds) 77.34   1 min 42.91 sec, 1 min 42.32 sec, 1 min 17.34 sec   TUG Comments with FWW; patient with significant difficulty with turns, slightly better each attempt with practice   difficulty with turns                          [facility] Adult PT Treatment/Exercise - 03/23/21 0001       Ambulation/Gait   Gait Comments x75 ft x2 around clinic with FWW; tactile cues for upright posture, L foot clearance, and frequent redirection to attend to things on his L      High Level Balance   High Level Balance Comments seated reaching for ball multidirection; standing alt marches      Knee/Hip Exercises: Aerobic   Nustep level 5 x 6 minutes UE and LE                      PT Short Term Goals - 12/23/20 1748       PT SHORT TERM GOAL #1   Title Pt will perform HEP with wife's assistance, for improved strength, balance, gait.    Time 4    Period Weeks  Status On-going   wife reports difficulty with getting Mr Matusek to do exercises at home              PT Long Term Goals - 03/23/21 1418       PT LONG TERM GOAL #1   Title Pt will perform progressive HEP with family supervision and assistance for improved balance, strength, gait.    Status On-going      PT LONG TERM GOAL #2   Title Pt will improve 5TSTS score to </= 20 seconds to demonstrate improved functional strength and decreased fall risk    Baseline Initial 54". 12/23/20: 35 seconds with arms of chair BUE support for push up. 02/08/21: 28Sec with BUE assist on mat to push up. 03/23/21: 33.22 sec with BUE assist on arms of chair    Status On-going      PT LONG TERM GOAL #3   Title Pt will improve TUG score to less than or equal to 25 seconds for decreased fall risk.    Baseline Initial 59 sec. 12/23/20: 55 seconds. 02/08/21: 44 seconds - most difficulty and time with turns. 03/23/21: 76 sec most difficulty and time with turns    Status On-going                   Plan - 03/23/21  1554     Clinical Impression Statement Pt demos regression from baseline since recent hospitalization. Both TUG and 5x STS scores have declined since eval in March 2022. Pt demos increased difficulty with turns this rx and L inattention. Discussed regression with his wife who also feels like he is moving slower and having more difficulty since hospitalization. Plan to renew for 1 month to attempt to get back to baseline/make progress if able.    PT Treatment/Interventions ADLs/Self Care Home Management;DME Instruction;Neuromuscular re-education;Balance training;Therapeutic exercise;Therapeutic activities;Functional mobility training;Stair training;Gait training;Patient/family education;Energy conservation;Manual techniques    PT Next Visit Plan continue to work on his awareness of posture and work on this, also look to see if we can get better control of the left knee with walking - possibly use ace wrap or TB wrapping technique to prevent hyperextension in stance during gait training    Consulted and Agree with Plan of Care Patient;Family member/caregiver    Family Member Consulted wife             Patient will benefit from skilled therapeutic intervention in order to improve the following deficits and impairments:  Abnormal gait, Decreased coordination, Difficulty walking, Decreased safety awareness, Decreased balance, Decreased strength, Decreased mobility  Visit Diagnosis: Other abnormalities of gait and mobility  Muscle weakness (generalized)  Abnormal posture  Unsteadiness on feet     Problem List Patient Active Problem List   Diagnosis Date Noted   Ankle edema, bilateral 02/16/2021   Left hemiparesis ([place]) 12/06/2020   Benign essential HTN    Cognitive deficit, post-stroke    Controlled type 2 diabetes mellitus with hyperglycemia, without long-term current use of insulin ([place])    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 [place], [place], [address] Phone: [phone]   Fax:  [phone]  Name: ZAFIR SCHAUER Perry. MRN: [id number] Date of Birth: [birth year]

## 2021-03-24 ENCOUNTER — Telehealth: Payer: Self-pay | Admitting: Critical Care Medicine

## 2021-03-24 ENCOUNTER — Encounter: Payer: Self-pay | Admitting: Critical Care Medicine

## 2021-03-24 DIAGNOSIS — I69319 Unspecified symptoms and signs involving cognitive functions following cerebral infarction: Secondary | ICD-10-CM

## 2021-03-24 MED ORDER — QUETIAPINE FUMARATE 25 MG PO TABS: 50.0000 mg | ORAL_TABLET | Freq: Every day | ORAL | 3 refills | Status: DC

## 2021-03-24 NOTE — Telephone Encounter (Signed)
Pt needs refills seroquel

## 2021-03-29 ENCOUNTER — Encounter: Payer: Self-pay | Admitting: Speech Pathology

## 2021-03-29 ENCOUNTER — Other Ambulatory Visit: Payer: Self-pay | Admitting: Critical Care Medicine

## 2021-03-29 ENCOUNTER — Ambulatory Visit: Payer: Medicare Other | Admitting: Speech Pathology

## 2021-03-29 ENCOUNTER — Ambulatory Visit: Payer: Medicare Other | Admitting: Occupational Therapy

## 2021-03-29 ENCOUNTER — Other Ambulatory Visit: Payer: Self-pay

## 2021-03-29 DIAGNOSIS — I69319 Unspecified symptoms and signs involving cognitive functions following cerebral infarction: Secondary | ICD-10-CM

## 2021-03-29 DIAGNOSIS — R4701 Aphasia: Secondary | ICD-10-CM

## 2021-03-29 DIAGNOSIS — R41842 Visuospatial deficit: Secondary | ICD-10-CM

## 2021-03-29 DIAGNOSIS — R41844 Frontal lobe and executive function deficit: Secondary | ICD-10-CM

## 2021-03-29 DIAGNOSIS — R41841 Cognitive communication deficit: Secondary | ICD-10-CM

## 2021-03-29 DIAGNOSIS — R260 Ataxic gait: Secondary | ICD-10-CM | POA: Diagnosis not present

## 2021-03-29 DIAGNOSIS — E1159 Type 2 diabetes mellitus with other circulatory complications: Secondary | ICD-10-CM

## 2021-03-29 DIAGNOSIS — R278 Other lack of coordination: Secondary | ICD-10-CM

## 2021-03-29 DIAGNOSIS — R2681 Unsteadiness on feet: Secondary | ICD-10-CM | POA: Diagnosis not present

## 2021-03-29 DIAGNOSIS — R2689 Other abnormalities of gait and mobility: Secondary | ICD-10-CM | POA: Diagnosis not present

## 2021-03-29 DIAGNOSIS — R293 Abnormal posture: Secondary | ICD-10-CM | POA: Diagnosis not present

## 2021-03-29 DIAGNOSIS — M6281 Muscle weakness (generalized): Secondary | ICD-10-CM | POA: Diagnosis not present

## 2021-03-29 NOTE — Telephone Encounter (Signed)
Requested Prescriptions  Pending Prescriptions Disp Refills  . metFORMIN (GLUCOPHAGE) 500 MG tablet [Pharmacy Med Name: metFORMIN HCl 500 MG Oral Tablet] 180 tablet 0    Sig: TAKE 1 TABLET BY MOUTH TWICE DAILY WITH A MEAL     Endocrinology:  Diabetes - Biguanides Passed - 03/29/2021  6:09 PM      Passed - Cr in normal range and within 360 days    Creat  Date Value Ref Range Status  09/08/2013 0.96 0.50 - 1.35 mg/dL Final   Creatinine, Ser  Date Value Ref Range Status  02/17/2021 0.68 0.61 - 1.24 mg/dL Final         Passed - HBA1C is between 0 and 7.9 and within 180 days    HbA1c, POC (controlled diabetic range)  Date Value Ref Range Status  12/06/2020 5.6 0.0 - 7.0 % Final   Hgb A1c MFr Bld  Date Value Ref Range Status  02/16/2021 5.9 (H) 4.8 - 5.6 % Final    Comment:    (NOTE)         Prediabetes: 5.7 - 6.4         Diabetes: >6.4         Glycemic control for adults with diabetes: <7.0          Passed - AA eGFR in normal range and within 360 days    GFR, Est African American  Date Value Ref Range Status  09/08/2013 >89 mL/min Final   GFR calc Af Amer  Date Value Ref Range Status  06/07/2020 >60 >60 mL/min Final   GFR, Est Non African American  Date Value Ref Range Status  09/08/2013 86 mL/min Final    Comment:      The estimated GFR is a calculation valid for adults (>=13 years old) that uses the CKD-EPI algorithm to adjust for age and sex. It is   not to be used for children, pregnant women, hospitalized patients,    patients on dialysis, or with rapidly changing kidney function. According to the NKDEP, eGFR >89 is normal, 60-89 shows mild impairment, 30-59 shows moderate impairment, 15-29 shows severe impairment and <15 is ESRD.     GFR, Estimated  Date Value Ref Range Status  02/17/2021 >60 >60 mL/min Final    Comment:    (NOTE) Calculated using the CKD-EPI Creatinine Equation (2021)    eGFR  Date Value Ref Range Status  02/15/2021 95 >59  mL/min/1.73 Final         Passed - Valid encounter within last 6 months    Recent Outpatient Visits          2 weeks ago Benign essential HTN   [place], MD   1 month ago Dysphagia due to recent stroke   [place], MD   3 months ago Controlled type 2 diabetes mellitus with hyperglycemia, without long-term current use of insulin [facility])   [place] Elsie Stain, MD   5 months ago Type 2 diabetes mellitus with other circulatory complication, without long-term current use of insulin [facility])   [place] Elsie Stain, MD   7 months ago Dysphagia due to recent stroke   [place], MD      Future Appointments            In 1 month Elsie Stain,  MD [place]

## 2021-03-29 NOTE — Therapy (Signed)
[place]. [place], [place], [address] Phone: [phone]   Fax:  [phone]  Speech Language Pathology Treatment  Patient Details  Name: Joe VEREEN Sr. MRN: [id number] Date of Birth: [birth year] Referring Provider (SLP): Elsie Stain, MD   Encounter Date: 03/29/2021   End of Session - 03/29/21 1440     Visit Number 27    Number of Visits --   Asking for 16 visits x2/week for 8 weeks.   Date for SLP Re-Evaluation 04/21/21    Authorization Time Period 04/21/21    Authorization - Visit Number 1    Authorization - Number of Visits 8    SLP Start Time 1400    SLP Stop Time  1440    SLP Time Calculation (min) 40 min    Activity Tolerance Patient limited by fatigue;Patient limited by lethargy             Past Medical History:  Diagnosis Date   Acute metabolic encephalopathy 7/89/3810   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 ([place]) 06/03/2019   Cerebrovascular accident (CVA) due to occlusion of vertebral artery ([name])    Diabetes mellitus without complication ([place])    Dysphagia    post stroke   Hypertension    Left upper lobe pneumonia 02/15/2021   Stroke [facility])     Past Surgical History:  Procedure Laterality Date   IR ANGIO INTRA EXTRACRAN SEL COM CAROTID INNOMINATE UNI L MOD SED  03/10/2019   IR ANGIO VERTEBRAL SEL SUBCLAVIAN INNOMINATE BILAT MOD SED  03/10/2019   IR CT HEAD LTD  03/10/2019   IR PERCUTANEOUS ART THROMBECTOMY/INFUSION INTRACRANIAL INC DIAG ANGIO  03/10/2019   RADIOLOGY WITH ANESTHESIA N/A 03/10/2019   Procedure: IR WITH ANESTHESIA/CODE STROKE;  Surgeon: Radiologist, Medication, MD;  Location: [place];  Service: Radiology;  Laterality: N/A;    There were no vitals filed for this visit.          ADULT SLP TREATMENT - 03/29/21 1436       General Information   Behavior/Cognition Cooperative;Lethargic      Treatment Provided    Treatment provided Cognitive-Linquistic      Cognitive-Linquistic Treatment   Treatment focused on Aphasia    Skilled Treatment Matching and identification of single words to pictures (F02). Pt required minA to complete. Benefited from SLP pointing to picture and words to increase probability of pt answering correctly.      Assessment / Recommendations / Plan   Plan Continue with current plan of care      Progression Toward Goals   Progression toward goals Progressing toward goals                SLP Short Term Goals - 03/29/21 1441       SLP SHORT TERM GOAL #1   Title Pt will name 6/10 objects independenty over 3 sessions to increase expressive communication.    Time 2    Period Weeks    Status Partially Met   Pt is able to make a choice verbally, but may not correctly identify.   Target Date 04/04/21      SLP SHORT TERM GOAL #2   Title Pt will follow through with swallowing saliva before speaking at home as reported by caregiver.    Period Weeks    Status Achieved      SLP SHORT TERM GOAL #3   Title Pt will demonstrate appropriate alternating attention  between two tasks with <1 verbal/visual cue.    Time 2    Period Weeks    Status Partially Met    Target Date 04/04/21      SLP SHORT TERM GOAL #4   Title Pt will solve basic problems in safety scenarios with minA.    Time 2    Period Weeks    Status On-going    Target Date 04/04/21      SLP SHORT TERM GOAL #5   Title Pt will match picture to words from a F03 to increase expressive and receptive language with 80% acc.    Time 4    Period Weeks    Status New    Target Date 04/21/21              SLP Long Term Goals - 03/21/21 1401       SLP LONG TERM GOAL #1   Title Client will utilize compensatory strategies to communicate wants and needs effectively to different conversational partners and participate socially in functional living environment.    Time 4    Period Weeks    Status On-going    Target  Date 04/21/21      SLP LONG TERM GOAL #2   Title Client will demonstrate understanding of verbal communication related to daily activities and utilize compensatory strategies to more effectively communicate in their functional living environment.    Time 4    Period Weeks    Status On-going    Target Date 04/21/21      SLP LONG TERM GOAL #3   Title Patient will develop functional attention skills to effectively attend to and communicate in tasks of daily living in their functional living environment.    Time 4    Period Weeks    Status On-going    Target Date 04/21/21              Plan - 03/29/21 1441     Clinical Impression Statement Difficulty with sustained attention. SLP unable to assess this session due to lethargy/fatigue. Pt requires continued services to assist with expressive communication.    Speech Therapy Frequency 2x / week    Duration 4 weeks    Treatment/Interventions Aspiration precaution training;Cueing hierarchy;Patient/family education;Functional tasks;Environmental controls;Cognitive reorganization;Multimodal communcation approach;Language facilitation;Compensatory techniques;Internal/external aids;SLP instruction and feedback;Compensatory strategies    Potential to Achieve Goals Fair    Potential Considerations Ability to learn/carryover information;Severity of impairments    Consulted and Agree with Plan of Care Patient;Family member/caregiver             Patient will benefit from skilled therapeutic intervention in order to improve the following deficits and impairments:   Aphasia  Cognitive communication deficit    Problem List Patient Active Problem List   Diagnosis Date Noted   Ankle edema, bilateral 02/16/2021   Left hemiparesis ([place]) 12/06/2020   Benign essential HTN    Cognitive deficit, post-stroke    Controlled type 2 diabetes mellitus with hyperglycemia, without long-term current use of insulin ([place])    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 [place], [place], [address] Phone: [phone]   Fax:  [phone]   Name: ABDOULAYE DRUM Sr. MRN: [id number] Date of Birth: 1951/05/10

## 2021-03-30 NOTE — Therapy (Signed)
[place]. [place], [place], [address] Phone: [phone]   Fax:  [phone]  Occupational Therapy Treatment  Patient Details  Name: Joe BEENE Sr. MRN: [id number] Date of Birth: [birth year] Referring Provider (OT): Asencion Noble, MD   Encounter Date: 03/29/2021   OT End of Session - 03/29/21 1449     Visit Number 5    Number of Visits 13    Date for OT Re-Evaluation 05/11/21    Authorization Type UHC Medicare; BCBS state (secondary)    Authorization Time Period VL: [place]    Progress Note Due on Visit 10    OT Start Time 1447    OT Stop Time 30    OT Time Calculation (min) 41 min    Activity Tolerance Patient tolerated treatment well    Behavior During Therapy Flat affect             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 ([place]) 06/03/2019   Cerebrovascular accident (CVA) due to occlusion of vertebral artery ([name])    Diabetes mellitus without complication ([place])    Dysphagia    post stroke   Hypertension    Left upper lobe pneumonia 02/15/2021   Stroke [facility])     Past Surgical History:  Procedure Laterality Date   IR ANGIO INTRA EXTRACRAN SEL COM CAROTID INNOMINATE UNI L MOD SED  03/10/2019   IR ANGIO VERTEBRAL SEL SUBCLAVIAN INNOMINATE BILAT MOD SED  03/10/2019   IR CT HEAD LTD  03/10/2019   IR PERCUTANEOUS ART THROMBECTOMY/INFUSION INTRACRANIAL INC DIAG ANGIO  03/10/2019   RADIOLOGY WITH ANESTHESIA N/A 03/10/2019   Procedure: IR WITH ANESTHESIA/CODE STROKE;  Surgeon: Radiologist, Medication, MD;  Location: [place];  Service: Radiology;  Laterality: N/A;    There were no vitals filed for this visit.   Subjective Assessment - 03/29/21 1449     Subjective  Pt states he is feeling well today    Patient is accompanied by: Family member   Wife [name]   Pertinent History Hx  of CVA (2017, March 2020, July 2020); T2DM; HTN    Limitations Fall risk; L homonymous hemianopia (per chart review); dysphagia; cognitive-communication deficit    Patient Stated Goals Get stronger; improve walking; improve independence    Currently in Pain? No/denies             Treatment/Exercises - 03/29/21    Coordination Activities  Retrieving quarters by sliding to end of table (pt experienced difficulty picking up off table) and then placing onto stack. Pt completed task w/ extended time due to bradykinesia but demo'd good accuracy and problem-solving to maintain alignment of stack before retrieving add'l coins. OT graded activity up to separating nickels from an assorted pile to stack  Flipping playing cards over using L hand w/ emphasis on opening hand/extending fingers before retrieving cards off deck; required modeling and mod verbal cues to extend hand and supinate forearm  Box and Blocks completed to re-assess STG4 while continuing to address functional 3-pt pinch, grasp and release, and UE coordination    Visual Scanning Symbol cancellation activity w/ pt identifying 16/24 icons at Mod I level, requiring extended time for processing. OT provided visual cue and occ verbal cues to fully scan to L side            OT Short Term Goals - 03/29/21 1514  OT SHORT TERM GOAL #1   Title Pt will be able to sequence through one BADL task (e.g., donning pants, washing UB) w/ SPV in at least 2 trials    Baseline Min A w/ BADL tasks    Time 6    Period Weeks    Status On-going    Target Date 04/01/21      OT SHORT TERM GOAL #2   Title Pt will improve participation in functional FM tasks as evidenced by completing 9-HPT w/ L hand in less than 3 minutes    Baseline 9-HPT w/ R hand (1 min, 14 sec); L hand (>3 min)    Time 6    Period Weeks    Status On-going      OT SHORT TERM GOAL #3   Title Pt will perform simple visual scanning at table with at least 90% accuracy.     Baseline Visual-perceptual deficits    Time 6    Period Weeks    Status On-going   03/29/21 - 67% accuracy     OT SHORT TERM GOAL #4   Title Pt will improve GM coordination during BADLs as evidenced by increasing Box and Blocks test by at least 4 blocks    Baseline Box and Blocks (30 seconds) w/ R hand (9); L hand (4)    Time 6    Period Weeks    Status On-going   03/29/21 - RUE 12; LUE 6            OT Long Term Goals - 03/21/21 1134       OT LONG TERM GOAL #1   Title Pt will be able to participate in HEP for strength, coordination, and visual-perception w/ SPV    Baseline No HEP at this time    Time 12    Period Weeks    Status On-going      OT LONG TERM GOAL #2   Title Pt will improve participation in functional FM tasks as evidenced by decreasing 9-HPT time w/ R, dominant hand by at least 14 seconds    Baseline 9-HPT w/ R hand (1 min, 14 sec); L hand (>3 min)    Time 12    Period Weeks    Status On-going      OT LONG TERM GOAL #3   Title Pt will be able to complete simple IADL task (e.g., washing plastic dish, folding towel) w/ SPV for safety in at least 2 trials    Baseline Very minimal participation in IADLs at this time    Time 12    Period Weeks    Status On-going      OT LONG TERM GOAL #4   Title Pt will demonstrate full BADL activity (dressing, bathing, toileting) w/ SPV for safety    Baseline Completing BADLs w/ Min A, per spouse report    Time 12    Period Weeks    Status On-going             Plan - 03/29/21 1530     Clinical Impression Statement Pt appeared more alert and attentive this session compared w/ preceding session and was able to complete coordination activities (coin stacking, flipping cards, B&B) w/ min cueing. Decreased speed of movement continues to be limiting.    OT Occupational Profile and History Detailed Assessment- Review of Records and additional review of physical, cognitive, psychosocial history related to current functional  performance    Occupational performance deficits (Please refer to evaluation  for details): ADL's;IADL's;Work;Leisure;Social Participation    Body Structure / Function / Physical Skills ADL;ROM;UE functional use;Balance;Decreased knowledge of use of DME;FMC;Mobility;Vestibular;Body mechanics;Dexterity;Gait;Sensation;Vision;GMC;Strength;Proprioception;IADL;Coordination    Cognitive Skills Attention;Problem Solve;Safety Awareness;Sequencing    Rehab Potential Good    Clinical Decision Making Several treatment options, min-mod task modification necessary    Comorbidities Affecting Occupational Performance: May have comorbidities impacting occupational performance    Modification or Assistance to Complete Evaluation  Min-Moderate modification of tasks or assist with assess necessary to complete eval    OT Frequency 1x / week    OT Duration 12 weeks    OT Treatment/Interventions Self-care/ADL training;Therapeutic exercise;Visual/perceptual remediation/compensation;Aquatic Therapy;Moist Heat;Cryotherapy;Neuromuscular education;Patient/family education;Fluidtherapy;Energy conservation;Therapist, nutritional;Therapeutic activities;Balance training;DME and/or AE instruction;Manual Therapy;Passive range of motion;Cognitive remediation/compensation    Plan Large amplitude movements and/or LUE weight-bearing/shifting for increased L-sided awareness    Consulted and Agree with Plan of Care Patient;Family member/caregiver    Family Member Consulted Wife Transport planner)             Patient will benefit from skilled therapeutic intervention in order to improve the following deficits and impairments:   Body Structure / Function / Physical Skills: ADL, ROM, UE functional use, Balance, Decreased knowledge of use of DME, FMC, Mobility, Vestibular, Body mechanics, Dexterity, Gait, Sensation, Vision, GMC, Strength, Proprioception, IADL, Coordination Cognitive Skills: Attention, Problem Solve, Safety Awareness,  Sequencing     Visit Diagnosis: Other lack of coordination  Muscle weakness (generalized)  Visuospatial deficit  Unspecified symptoms and signs involving cognitive functions following cerebral infarction  Frontal lobe and executive function deficit    Problem List Patient Active Problem List   Diagnosis Date Noted   Ankle edema, bilateral 02/16/2021   Left hemiparesis ([place]) 12/06/2020   Benign essential HTN    Cognitive deficit, post-stroke    Controlled type 2 diabetes mellitus with hyperglycemia, without long-term current use of insulin ([name])    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 [place], [place], [address] Phone: [phone]   Fax:  [phone]  Name: Joe FAMBROUGH Sr. MRN: [id number] Date of Birth: 04-12-1951

## 2021-03-31 ENCOUNTER — Ambulatory Visit: Payer: Medicare Other

## 2021-03-31 ENCOUNTER — Other Ambulatory Visit: Payer: Self-pay

## 2021-03-31 ENCOUNTER — Ambulatory Visit: Payer: Medicare Other | Admitting: Speech Pathology

## 2021-03-31 ENCOUNTER — Encounter: Payer: Self-pay | Admitting: Speech Pathology

## 2021-03-31 DIAGNOSIS — R2689 Other abnormalities of gait and mobility: Secondary | ICD-10-CM

## 2021-03-31 DIAGNOSIS — R4701 Aphasia: Secondary | ICD-10-CM

## 2021-03-31 DIAGNOSIS — R2681 Unsteadiness on feet: Secondary | ICD-10-CM | POA: Diagnosis not present

## 2021-03-31 DIAGNOSIS — R41841 Cognitive communication deficit: Secondary | ICD-10-CM

## 2021-03-31 DIAGNOSIS — M6281 Muscle weakness (generalized): Secondary | ICD-10-CM

## 2021-03-31 DIAGNOSIS — R278 Other lack of coordination: Secondary | ICD-10-CM | POA: Diagnosis not present

## 2021-03-31 DIAGNOSIS — R260 Ataxic gait: Secondary | ICD-10-CM | POA: Diagnosis not present

## 2021-03-31 DIAGNOSIS — I69319 Unspecified symptoms and signs involving cognitive functions following cerebral infarction: Secondary | ICD-10-CM | POA: Diagnosis not present

## 2021-03-31 DIAGNOSIS — R293 Abnormal posture: Secondary | ICD-10-CM | POA: Diagnosis not present

## 2021-03-31 NOTE — Therapy (Signed)
[place]. [place], [place], [address] Phone: [phone]   Fax:  [phone]  Speech Language Pathology Treatment  Patient Details  Name: Joe QUINTER Sr. MRN: [id number] Date of Birth: 1951/02/07 Referring Provider (SLP): Elsie Stain, MD   Encounter Date: 03/31/2021   End of Session - 03/31/21 1312     Visit Number 28    Number of Visits --   Asking for 16 visits x2/week for 8 weeks.   Date for SLP Re-Evaluation 04/21/21    Authorization Time Period 04/21/21    Authorization - Visit Number 2    Authorization - Number of Visits 8    SLP Start Time 1308    SLP Stop Time  9150    SLP Time Calculation (min) 40 min    Activity Tolerance Patient limited by fatigue;Patient limited by lethargy             Past Medical History:  Diagnosis Date   Acute metabolic encephalopathy 5/69/7948   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 ([place]) 06/03/2019   Cerebrovascular accident (CVA) due to occlusion of vertebral artery ([name])    Diabetes mellitus without complication ([place])    Dysphagia    post stroke   Hypertension    Left upper lobe pneumonia 02/15/2021   Stroke [facility])     Past Surgical History:  Procedure Laterality Date   IR ANGIO INTRA EXTRACRAN SEL COM CAROTID INNOMINATE UNI L MOD SED  03/10/2019   IR ANGIO VERTEBRAL SEL SUBCLAVIAN INNOMINATE BILAT MOD SED  03/10/2019   IR CT HEAD LTD  03/10/2019   IR PERCUTANEOUS ART THROMBECTOMY/INFUSION INTRACRANIAL INC DIAG ANGIO  03/10/2019   RADIOLOGY WITH ANESTHESIA N/A 03/10/2019   Procedure: IR WITH ANESTHESIA/CODE STROKE;  Surgeon: Radiologist, Medication, MD;  Location: [facility];  Service: Radiology;  Laterality: N/A;    There were no vitals filed for this visit.   Subjective Assessment - 03/31/21 1311     Subjective Pt was quiet and sleeping when SLP came to get him.    Currently in Pain? No/denies                    ADULT SLP TREATMENT - 03/31/21 1312       General Information   Behavior/Cognition Cooperative;Lethargic      Treatment Provided   Treatment provided Cognitive-Linquistic      Cognitive-Linquistic Treatment   Treatment focused on Aphasia    Skilled Treatment Matching and identification of single words to pictures (F02). Pt required minA to complete. Benefited from SLP pointing to picture and words to increase probability of pt answering correctly. Assessed matching single letters - pt required mod cues for attention to drag and drop.      Assessment / Recommendations / Plan   Plan Continue with current plan of care      Progression Toward Goals   Progression toward goals Progressing toward goals                SLP Short Term Goals - 03/31/21 1314       SLP SHORT TERM GOAL #1   Title Pt will name 6/10 objects independenty over 3 sessions to increase expressive communication.    Time 2    Period Weeks    Status Partially Met   Pt is able to make a choice verbally, but may not correctly identify.   Target Date 04/04/21  SLP SHORT TERM GOAL #2   Title Pt will follow through with swallowing saliva before speaking at home as reported by caregiver.    Period Weeks    Status Achieved      SLP SHORT TERM GOAL #3   Title Pt will demonstrate appropriate alternating attention between two tasks with <1 verbal/visual cue.    Time 2    Period Weeks    Status Partially Met    Target Date 04/04/21      SLP SHORT TERM GOAL #4   Title Pt will solve basic problems in safety scenarios with minA.    Time 2    Period Weeks    Status On-going    Target Date 04/04/21      SLP SHORT TERM GOAL #5   Title Pt will match picture to words from a F03 to increase expressive and receptive language with 80% acc.    Time 2    Period Weeks    Status On-going    Target Date 04/21/21              SLP Long Term Goals - 03/31/21 1313       SLP LONG TERM GOAL #1    Title Client will utilize compensatory strategies to communicate wants and needs effectively to different conversational partners and participate socially in functional living environment.    Time 4    Period Weeks    Status On-going      SLP LONG TERM GOAL #2   Title Client will demonstrate understanding of verbal communication related to daily activities and utilize compensatory strategies to more effectively communicate in their functional living environment.    Time 4    Period Weeks    Status On-going      SLP LONG TERM GOAL #3   Title Patient will develop functional attention skills to effectively attend to and communicate in tasks of daily living in their functional living environment.    Time 4    Period Weeks    Status On-going              Plan - 03/31/21 1313     Clinical Impression Statement See tx note. Pt tired today. Pt requires continued services to assist with expressive communication.    Speech Therapy Frequency 2x / week    Duration 4 weeks    Treatment/Interventions Aspiration precaution training;Cueing hierarchy;Patient/family education;Functional tasks;Environmental controls;Cognitive reorganization;Multimodal communcation approach;Language facilitation;Compensatory techniques;Internal/external aids;SLP instruction and feedback;Compensatory strategies    Potential to Achieve Goals Fair    Potential Considerations Ability to learn/carryover information;Severity of impairments    Consulted and Agree with Plan of Care Patient;Family member/caregiver             Patient will benefit from skilled therapeutic intervention in order to improve the following deficits and impairments:   Aphasia  Cognitive communication deficit    Problem List Patient Active Problem List   Diagnosis Date Noted   Ankle edema, bilateral 02/16/2021   Left hemiparesis ([place]) 12/06/2020   Benign essential HTN    Cognitive deficit, post-stroke    Controlled type 2 diabetes  mellitus with hyperglycemia, without long-term current use of insulin ([place])    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 [place], [place], [address] Phone: [phone]   Fax:  [phone]   Name: Joe YASUDA Sr. MRN: [id number] Date of Birth: Jan 27, 1951

## 2021-03-31 NOTE — Therapy (Signed)
[place]. [place], [place], [address] Phone: [phone]   Fax:  [phone]  Physical Therapy Treatment  Patient Details  Name: Joe POLT Sr. MRN: [id number] Date of Birth: 10-Dec-1950 Referring Provider (PT): Asencion Noble, MD   Encounter Date: 03/31/2021   PT End of Session - 03/31/21 1259     Visit Number 23    Date for PT Re-Evaluation 04/23/21    PT Start Time 1215    PT Stop Time 1300    PT Time Calculation (min) 45 min    Equipment Utilized During Treatment Gait belt    Activity Tolerance Patient limited by fatigue    Behavior During Therapy Flat affect             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 ([place]) 06/03/2019   Cerebrovascular accident (CVA) due to occlusion of vertebral artery ([name])    Diabetes mellitus without complication ([place])    Dysphagia    post stroke   Hypertension    Left upper lobe pneumonia 02/15/2021   Stroke [facility])     Past Surgical History:  Procedure Laterality Date   IR ANGIO INTRA EXTRACRAN SEL COM CAROTID INNOMINATE UNI L MOD SED  03/10/2019   IR ANGIO VERTEBRAL SEL SUBCLAVIAN INNOMINATE BILAT MOD SED  03/10/2019   IR CT HEAD LTD  03/10/2019   IR PERCUTANEOUS ART THROMBECTOMY/INFUSION INTRACRANIAL INC DIAG ANGIO  03/10/2019   RADIOLOGY WITH ANESTHESIA N/A 03/10/2019   Procedure: IR WITH ANESTHESIA/CODE STROKE;  Surgeon: Radiologist, Medication, MD;  Location: [facility];  Service: Radiology;  Laterality: N/A;    There were no vitals filed for this visit.   Subjective Assessment - 03/31/21 1259     Subjective Pt wife reports no new changes, no new falls.    Currently in Pain? No/denies                               [facility] Adult PT Treatment/Exercise - 03/31/21 0001       Ambulation/Gait   Gait Comments 100 ft x  2 around clinic with blue TB wrap to encourage LLE flexion, decrease hyperextension      Therapeutic Activites    Other Therapeutic Activities standing marches x 10 B alt with BUE support. Standing reaches with RUE x 10 verhead for extension. seatedPT assisted trunk exte stretch 5 x 5". with Overhead reach x 10, PT assist of stretch into soulder/trunk extension gentle - 2 sets                      PT Short Term Goals - 12/23/20 1748       PT SHORT TERM GOAL #1   Title Pt will perform HEP with wife's assistance, for improved strength, balance, gait.    Time 4    Period Weeks    Status On-going   wife reports difficulty with getting Mr Sitzes to do exercises at home              PT Long Term Goals - 03/23/21 1418       PT LONG TERM GOAL #1   Title Pt will perform progressive HEP with family supervision and assistance for improved balance, strength, gait.    Status On-going      PT LONG  TERM GOAL #2   Title Pt will improve 5TSTS score to </= 20 seconds to demonstrate improved functional strength and decreased fall risk    Baseline Initial 54". 12/23/20: 35 seconds with arms of chair BUE support for push up. 02/08/21: 28Sec with BUE assist on mat to push up. 03/23/21: 33.22 sec with BUE assist on arms of chair    Status On-going      PT LONG TERM GOAL #3   Title Pt will improve TUG score to less than or equal to 25 seconds for decreased fall risk.    Baseline Initial 59 sec. 12/23/20: 55 seconds. 02/08/21: 44 seconds - most difficulty and time with turns. 03/23/21: 76 sec most difficulty and time with turns    Status On-going                   Plan - 03/31/21 1301     Clinical Impression Statement Today spent on postural control, trying to get into midline. He is verring more over to his right side and he does demo trunk and shoulder trightness on that side as well. Tolerated trunk and shoulder stretch for extension nicely with PT assist, no c/o or observations of  discomfort. Trialed some gait training with TB assist for LLE flexion to prevent hyperext and faciltiate swing    PT Treatment/Interventions ADLs/Self Care Home Management;DME Instruction;Neuromuscular re-education;Balance training;Therapeutic exercise;Therapeutic activities;Functional mobility training;Stair training;Gait training;Patient/family education;Energy conservation;Manual techniques    PT Next Visit Plan continue to work on his awareness of posture and work on this, also look to see if we can get better control of the left knee with walking - possibly use ace wrap or TB wrapping technique to prevent hyperextension in stance during gait training. More stretching of right side    Consulted and Agree with Plan of Care Patient;Family member/caregiver    Family Member Consulted wife             Patient will benefit from skilled therapeutic intervention in order to improve the following deficits and impairments:  Abnormal gait, Decreased coordination, Difficulty walking, Decreased safety awareness, Decreased balance, Decreased strength, Decreased mobility  Visit Diagnosis: Muscle weakness (generalized)  Other abnormalities of gait and mobility  Abnormal posture  Unsteadiness on feet  Ataxic gait     Problem List Patient Active Problem List   Diagnosis Date Noted   Ankle edema, bilateral 02/16/2021   Left hemiparesis ([place]) 12/06/2020   Benign essential HTN    Cognitive deficit, post-stroke    Controlled type 2 diabetes mellitus with hyperglycemia, without long-term current use of insulin ([name])    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 [place], [place], [address] Phone: [phone]   Fax:  [phone]  Name: Joe HAAG Sr. MRN: [id number] Date of Birth: 1951/06/28

## 2021-04-05 ENCOUNTER — Ambulatory Visit: Payer: Medicare Other | Attending: Critical Care Medicine | Admitting: Occupational Therapy

## 2021-04-05 ENCOUNTER — Other Ambulatory Visit: Payer: Self-pay

## 2021-04-05 ENCOUNTER — Encounter: Payer: Self-pay | Admitting: Occupational Therapy

## 2021-04-05 ENCOUNTER — Ambulatory Visit: Payer: Medicare Other | Admitting: Speech Pathology

## 2021-04-05 DIAGNOSIS — R4701 Aphasia: Secondary | ICD-10-CM | POA: Insufficient documentation

## 2021-04-05 DIAGNOSIS — I69319 Unspecified symptoms and signs involving cognitive functions following cerebral infarction: Secondary | ICD-10-CM | POA: Diagnosis not present

## 2021-04-05 DIAGNOSIS — R41842 Visuospatial deficit: Secondary | ICD-10-CM | POA: Diagnosis present

## 2021-04-05 DIAGNOSIS — R41841 Cognitive communication deficit: Secondary | ICD-10-CM | POA: Diagnosis present

## 2021-04-05 DIAGNOSIS — R260 Ataxic gait: Secondary | ICD-10-CM | POA: Insufficient documentation

## 2021-04-05 DIAGNOSIS — R2689 Other abnormalities of gait and mobility: Secondary | ICD-10-CM | POA: Diagnosis not present

## 2021-04-05 DIAGNOSIS — M6281 Muscle weakness (generalized): Secondary | ICD-10-CM | POA: Insufficient documentation

## 2021-04-05 DIAGNOSIS — R278 Other lack of coordination: Secondary | ICD-10-CM | POA: Insufficient documentation

## 2021-04-05 DIAGNOSIS — R2681 Unsteadiness on feet: Secondary | ICD-10-CM | POA: Diagnosis not present

## 2021-04-05 DIAGNOSIS — R41844 Frontal lobe and executive function deficit: Secondary | ICD-10-CM | POA: Insufficient documentation

## 2021-04-05 DIAGNOSIS — R293 Abnormal posture: Secondary | ICD-10-CM | POA: Diagnosis not present

## 2021-04-05 NOTE — Therapy (Signed)
[place]. [place], [place], [address] Phone: [phone]   Fax:  [phone]  Speech Language Pathology Treatment  Patient Details  Name: Joe NEIDHARDT Sr. MRN: [id number] Date of Birth: [birth year] Referring Provider (SLP): Elsie Stain, MD   Encounter Date: 04/05/2021   End of Session - 04/05/21 1411     Visit Number 29    Number of Visits --   Asking for 16 visits x2/week for 8 weeks.   Date for SLP Re-Evaluation 04/21/21    Authorization Time Period 04/21/21    Authorization - Visit Number 3    Authorization - Number of Visits 8    SLP Start Time 1400    SLP Stop Time  7353    SLP Time Calculation (min) 45 min    Activity Tolerance Patient limited by fatigue;Patient limited by lethargy             Past Medical History:  Diagnosis Date   Acute metabolic encephalopathy 2/99/2426   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 ([place]) 06/03/2019   Cerebrovascular accident (CVA) due to occlusion of vertebral artery ([name])    Diabetes mellitus without complication ([place])    Dysphagia    post stroke   Hypertension    Left upper lobe pneumonia 02/15/2021   Stroke [facility])     Past Surgical History:  Procedure Laterality Date   IR ANGIO INTRA EXTRACRAN SEL COM CAROTID INNOMINATE UNI L MOD SED  03/10/2019   IR ANGIO VERTEBRAL SEL SUBCLAVIAN INNOMINATE BILAT MOD SED  03/10/2019   IR CT HEAD LTD  03/10/2019   IR PERCUTANEOUS ART THROMBECTOMY/INFUSION INTRACRANIAL INC DIAG ANGIO  03/10/2019   RADIOLOGY WITH ANESTHESIA N/A 03/10/2019   Procedure: IR WITH ANESTHESIA/CODE STROKE;  Surgeon: Radiologist, Medication, MD;  Location: [facility];  Service: Radiology;  Laterality: N/A;    There were no vitals filed for this visit.          ADULT SLP TREATMENT - 04/05/21 1437       General Information   Behavior/Cognition Lethargic      Treatment Provided   Treatment provided  Cognitive-Linquistic      Cognitive-Linquistic Treatment   Treatment focused on Aphasia    Skilled Treatment Matching verbally presented stimuli to pictures from a F03 - 50% correct. Attempted problem solving. Pt required mod-to-maxA for safety awareness. Pt has not been making progress since his admission to the hospital in June. Pt has been consistently tired and does not seem motivated to participate. SLP to finish recertication cycle and begin talking to patient wife about discharge from SLP services.      Assessment / Recommendations / Plan   Plan Continue with current plan of care      Progression Toward Goals   Progression toward goals Not progressing toward goals (comment)   See tx note.               SLP Short Term Goals - 04/05/21 1415       SLP SHORT TERM GOAL #1   Title Pt will name 6/10 objects modA over 3 sessions to increase expressive communication.    Time 2    Period Weeks    Status Not Met   Pt is able to make a choice verbally, but may not correctly identify.   Target Date 04/04/21      SLP SHORT TERM GOAL #2   Title Pt will follow through  with swallowing saliva before speaking at home as reported by caregiver.    Period Weeks    Status Achieved      SLP SHORT TERM GOAL #3   Title Pt will demonstrate appropriate alternating attention between two tasks with <1 verbal/visual cue.    Time 2    Period Weeks    Status Not Met    Target Date 04/04/21      SLP SHORT TERM GOAL #4   Title Pt will solve basic problems in safety scenarios with minA.    Time 2    Period Weeks    Status Not Met    Target Date 04/04/21      SLP SHORT TERM GOAL #5   Title Pt will match picture to words from a F03 to increase expressive and receptive language with 80% acc.    Time 2    Period Weeks    Status On-going    Target Date 04/21/21              SLP Long Term Goals - 04/05/21 1435       SLP LONG TERM GOAL #1   Title Client will utilize compensatory  strategies to communicate wants and needs effectively to different conversational partners and participate socially in functional living environment.    Time 4    Period Weeks    Status On-going    Target Date 04/21/21      SLP LONG TERM GOAL #2   Title Client will demonstrate understanding of verbal communication related to daily activities and utilize compensatory strategies to more effectively communicate in their functional living environment.    Time 4    Period Weeks    Status On-going    Target Date 04/21/21      SLP LONG TERM GOAL #3   Title Patient will develop functional attention skills to effectively attend to and communicate in tasks of daily living in their functional living environment.    Time 4    Period Weeks    Status On-going    Target Date 04/21/21              Plan - 04/05/21 1412     Clinical Impression Statement Pt required cueing to remain awake. Benefited from F03 for options for foods.on Ipad. Pt requires continued services to assist with expressive communication.    Speech Therapy Frequency 2x / week    Duration 4 weeks    Treatment/Interventions Aspiration precaution training;Cueing hierarchy;Patient/family education;Functional tasks;Environmental controls;Cognitive reorganization;Multimodal communcation approach;Language facilitation;Compensatory techniques;Internal/external aids;SLP instruction and feedback;Compensatory strategies    Potential to Achieve Goals Fair    Potential Considerations Ability to learn/carryover information;Severity of impairments    Consulted and Agree with Plan of Care Patient;Family member/caregiver             Patient will benefit from skilled therapeutic intervention in order to improve the following deficits and impairments:   Aphasia  Cognitive communication deficit    Problem List Patient Active Problem List   Diagnosis Date Noted   Ankle edema, bilateral 02/16/2021   Left hemiparesis ([place]) 12/06/2020    Benign essential HTN    Cognitive deficit, post-stroke    Controlled type 2 diabetes mellitus with hyperglycemia, without long-term current use of insulin ([place])    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 [place], [place], [address] Phone: [phone]   Fax:  [phone]   Name: Joe EASTRIDGE Sr. MRN: [id number] Date of Birth: 1950/09/19

## 2021-04-06 NOTE — Therapy (Signed)
[place]. [place], [place], [address] Phone: [phone]   Fax:  [phone]  Occupational Therapy Treatment  Patient Details  Name: Joe KRAVCHUK Sr. MRN: [id number] Date of Birth: [birth year] Referring Provider (OT): Asencion Noble, MD   Encounter Date: 04/05/2021   OT End of Session - 04/05/21 1324     Visit Number 6    Number of Visits 13    Date for OT Re-Evaluation 05/11/21    Authorization Type UHC Medicare; BCBS state (secondary)    Authorization Time Period VL: [place]    Progress Note Due on Visit 10    OT Start Time 1317    OT Stop Time 1357    OT Time Calculation (min) 40 min    Activity Tolerance Patient tolerated treatment well    Behavior During Therapy Flat affect             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 ([place]) 06/03/2019   Cerebrovascular accident (CVA) due to occlusion of vertebral artery ([name])    Diabetes mellitus without complication ([place])    Dysphagia    post stroke   Hypertension    Left upper lobe pneumonia 02/15/2021   Stroke [facility])     Past Surgical History:  Procedure Laterality Date   IR ANGIO INTRA EXTRACRAN SEL COM CAROTID INNOMINATE UNI L MOD SED  03/10/2019   IR ANGIO VERTEBRAL SEL SUBCLAVIAN INNOMINATE BILAT MOD SED  03/10/2019   IR CT HEAD LTD  03/10/2019   IR PERCUTANEOUS ART THROMBECTOMY/INFUSION INTRACRANIAL INC DIAG ANGIO  03/10/2019   RADIOLOGY WITH ANESTHESIA N/A 03/10/2019   Procedure: IR WITH ANESTHESIA/CODE STROKE;  Surgeon: Radiologist, Medication, MD;  Location: [facility];  Service: Radiology;  Laterality: N/A;    There were no vitals filed for this visit.   Subjective Assessment - 04/05/21 1324     Subjective  Pt reported he could not tell that he was leaning to his R side when ambulating in/out of session w/ RW    Patient is  accompanied by: Family member   Wife [name]   Pertinent History Hx of CVA (2017, March 2020, July 2020); T2DM; HTN    Limitations Fall risk; L homonymous hemianopia (per chart review); dysphagia; cognitive-communication deficit    Patient Stated Goals Get stronger; improve walking; improve independence    Currently in Pain? No/denies             Treatment/Exercises - 04/05/21    Functional Mobility Ambulation w/ RW in/out of session w/ mod verbal cues for L inattention and safety/positioning of RW    Pegboard Activity  Copying pattern onto large pegboard w/ easy grip pegs to facilitate [facility] and intrinsic hand strengthening, alternating attention, and visual perception. OT positioned pegs on mat to L side of pt to facilitate attention to L side throughout activity. Pt required visual/verbal cues, extended processing time, and grading within task for success as well as auditory and verbal cues to attend to pegs on L side    [place] Picking up marbles 4 at a time, translating each to fingertips, and placing on easy-grip pegs to facilitate in-hand manipulation, [place], and intrinsic hand strengthening. Completed w/ multiple drops and min cues for sequencing of task. To return marbles to container, pt picked up off of pegs individually using 2-point pinch w/out difficulty.    9-HPT 9-HPT  to address STG2 and continue to facilitate L hand [facility] and dexterity. Completed 1st trial in 2 min, 53 sec w/ 2 drops and switched to R hand to take out last 4/9 pegs; 2nd trial completed in 1 min, 46 sec w/ 1 drop            OT Short Term Goals - 04/05/21 1352       OT SHORT TERM GOAL #1   Title Pt will be able to sequence through one BADL task (e.g., donning pants, washing UB) w/ SPV in at least 2 trials    Baseline Min A w/ BADL tasks    Time 6    Period Weeks    Status On-going    Target Date 04/01/21      OT SHORT TERM GOAL #2   Title Pt will improve participation in functional FM tasks as evidenced by  completing 9-HPT w/ L hand in less than 3 minutes    Baseline 9-HPT w/ R hand (1 min, 14 sec); L hand (>3 min)    Time 6    Period Weeks    Status Achieved   04/05/21 - 1 min, 46 sec w/ LUE/1 drop; 1st trial in 2 min, 53 sec w/ LUE/2 drops/switched to R hand to take out last 4/9 pegs     OT SHORT TERM GOAL #3   Title Pt will perform simple visual scanning at table with at least 90% accuracy.    Baseline Visual-perceptual deficits    Time 6    Period Weeks    Status On-going   03/29/21 - 67% accuracy     OT SHORT TERM GOAL #4   Title Pt will improve GM coordination during BADLs as evidenced by increasing Box and Blocks test by at least 4 blocks    Baseline Box and Blocks (30 seconds) w/ R hand (9); L hand (4)    Time 6    Period Weeks    Status On-going   03/29/21 - RUE 12; LUE 6            OT Long Term Goals - 03/21/21 1134       OT LONG TERM GOAL #1   Title Pt will be able to participate in HEP for strength, coordination, and visual-perception w/ SPV    Baseline No HEP at this time    Time 12    Period Weeks    Status On-going      OT LONG TERM GOAL #2   Title Pt will improve participation in functional FM tasks as evidenced by decreasing 9-HPT time w/ R, dominant hand by at least 14 seconds    Baseline 9-HPT w/ R hand (1 min, 14 sec); L hand (>3 min)    Time 12    Period Weeks    Status On-going      OT LONG TERM GOAL #3   Title Pt will be able to complete simple IADL task (e.g., washing plastic dish, folding towel) w/ SPV for safety in at least 2 trials    Baseline Very minimal participation in IADLs at this time    Time 12    Period Weeks    Status On-going      OT LONG TERM GOAL #4   Title Pt will demonstrate full BADL activity (dressing, bathing, toileting) w/ SPV for safety    Baseline Completing BADLs w/ Min A, per spouse report    Time 12    Period Weeks  Status On-going             Plan - 04/05/21 1400     Clinical Impression Statement Pt did  well w/ coordination activities this session; when provided extended time for processing, cueing, and repetition, pt is able to exhibit good understanding of novel tasks. Pt's wife was present during first half of session and reports that his leaning to the R side, particularly during ambulation, has been more noticeable recently.    OT Occupational Profile and History Detailed Assessment- Review of Records and additional review of physical, cognitive, psychosocial history related to current functional performance    Occupational performance deficits (Please refer to evaluation for details): ADL's;IADL's;Work;Leisure;Social Participation    Body Structure / Function / Physical Skills ADL;ROM;UE functional use;Balance;Decreased knowledge of use of DME;FMC;Mobility;Vestibular;Body mechanics;Dexterity;Gait;Sensation;Vision;GMC;Strength;Proprioception;IADL;Coordination    Cognitive Skills Attention;Problem Solve;Safety Awareness;Sequencing    Rehab Potential Good    Clinical Decision Making Several treatment options, min-mod task modification necessary    Comorbidities Affecting Occupational Performance: May have comorbidities impacting occupational performance    Modification or Assistance to Complete Evaluation  Min-Moderate modification of tasks or assist with assess necessary to complete eval    OT Frequency 1x / week    OT Duration 12 weeks    OT Treatment/Interventions Self-care/ADL training;Therapeutic exercise;Visual/perceptual remediation/compensation;Aquatic Therapy;Moist Heat;Cryotherapy;Neuromuscular education;Patient/family education;Fluidtherapy;Energy conservation;Therapist, nutritional;Therapeutic activities;Balance training;DME and/or AE instruction;Manual Therapy;Passive range of motion;Cognitive remediation/compensation    Plan Address remaining STGs; large amplitude movements and/or LUE weight-bearing/shifting for increased L-sided awareness    Consulted and Agree with Plan of Care  Patient;Family member/caregiver    Family Member Consulted Wife Transport planner)             Patient will benefit from skilled therapeutic intervention in order to improve the following deficits and impairments:   Body Structure / Function / Physical Skills: ADL, ROM, UE functional use, Balance, Decreased knowledge of use of DME, FMC, Mobility, Vestibular, Body mechanics, Dexterity, Gait, Sensation, Vision, GMC, Strength, Proprioception, IADL, Coordination Cognitive Skills: Attention, Problem Solve, Safety Awareness, Sequencing   Visit Diagnosis: Visuospatial deficit  Other lack of coordination  Unspecified symptoms and signs involving cognitive functions following cerebral infarction  Frontal lobe and executive function deficit  Muscle weakness (generalized)  Other abnormalities of gait and mobility    Problem List Patient Active Problem List   Diagnosis Date Noted   Ankle edema, bilateral 02/16/2021   Left hemiparesis ([place]) 12/06/2020   Benign essential HTN    Cognitive deficit, post-stroke    Controlled type 2 diabetes mellitus with hyperglycemia, without long-term current use of insulin ([name])    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 [place], [place], [address] Phone: [phone]   Fax:  [phone]  Name: Joe TOELLE Sr. MRN: [id number] Date of Birth: 09-09-1950

## 2021-04-07 ENCOUNTER — Ambulatory Visit: Payer: Medicare Other | Admitting: Speech Pathology

## 2021-04-07 ENCOUNTER — Ambulatory Visit: Payer: Medicare Other

## 2021-04-07 ENCOUNTER — Other Ambulatory Visit: Payer: Self-pay

## 2021-04-07 DIAGNOSIS — R41841 Cognitive communication deficit: Secondary | ICD-10-CM

## 2021-04-07 DIAGNOSIS — M6281 Muscle weakness (generalized): Secondary | ICD-10-CM

## 2021-04-07 DIAGNOSIS — R2689 Other abnormalities of gait and mobility: Secondary | ICD-10-CM

## 2021-04-07 DIAGNOSIS — R260 Ataxic gait: Secondary | ICD-10-CM

## 2021-04-07 DIAGNOSIS — I69319 Unspecified symptoms and signs involving cognitive functions following cerebral infarction: Secondary | ICD-10-CM | POA: Diagnosis not present

## 2021-04-07 DIAGNOSIS — R293 Abnormal posture: Secondary | ICD-10-CM | POA: Diagnosis not present

## 2021-04-07 DIAGNOSIS — R4701 Aphasia: Secondary | ICD-10-CM

## 2021-04-07 DIAGNOSIS — R278 Other lack of coordination: Secondary | ICD-10-CM | POA: Diagnosis not present

## 2021-04-07 DIAGNOSIS — R2681 Unsteadiness on feet: Secondary | ICD-10-CM

## 2021-04-07 DIAGNOSIS — R41842 Visuospatial deficit: Secondary | ICD-10-CM | POA: Diagnosis not present

## 2021-04-07 NOTE — Therapy (Signed)
[place]. [place], [place], [address] Phone: [phone]   Fax:  [phone]  Speech Language Pathology Treatment  Patient Details  Name: Joe HESSLING Sr. MRN: [id number] Date of Birth: [birth year] Referring Provider (SLP): Elsie Stain, MD   Encounter Date: 04/07/2021   End of Session - 04/07/21 1449     Visit Number 30    Number of Visits --   Asking for 16 visits x2/week for 8 weeks.   Date for SLP Re-Evaluation 04/21/21    Authorization Time Period 04/21/21    Authorization - Visit Number 4    Authorization - Number of Visits 8    SLP Start Time 3329    SLP Stop Time  5188    SLP Time Calculation (min) 40 min    Activity Tolerance Patient limited by fatigue;Patient limited by lethargy             Past Medical History:  Diagnosis Date   Acute metabolic encephalopathy 12/18/6061   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 ([place]) 06/03/2019   Cerebrovascular accident (CVA) due to occlusion of vertebral artery ([name])    Diabetes mellitus without complication ([place])    Dysphagia    post stroke   Hypertension    Left upper lobe pneumonia 02/15/2021   Stroke [facility])     Past Surgical History:  Procedure Laterality Date   IR ANGIO INTRA EXTRACRAN SEL COM CAROTID INNOMINATE UNI L MOD SED  03/10/2019   IR ANGIO VERTEBRAL SEL SUBCLAVIAN INNOMINATE BILAT MOD SED  03/10/2019   IR CT HEAD LTD  03/10/2019   IR PERCUTANEOUS ART THROMBECTOMY/INFUSION INTRACRANIAL INC DIAG ANGIO  03/10/2019   RADIOLOGY WITH ANESTHESIA N/A 03/10/2019   Procedure: IR WITH ANESTHESIA/CODE STROKE;  Surgeon: Radiologist, Medication, MD;  Location: [facility];  Service: Radiology;  Laterality: N/A;    There were no vitals filed for this visit.          ADULT SLP TREATMENT - 04/07/21 1447       General Information   Behavior/Cognition Lethargic      Treatment Provided   Treatment provided  Cognitive-Linquistic      Cognitive-Linquistic Treatment   Treatment focused on Aphasia    Skilled Treatment SLP changed environments today to try and combat lethargy. Also tried to incorporate a motor component (throwing a ball) to increase participation. Pt continues to exhibit difficulty with generative naming and categories. Required mod-to-maxA throughout today's session for naming fruits and animals. Pt is able to name at least 6 types of cars.      Assessment / Recommendations / Plan   Plan Continue with current plan of care                SLP Short Term Goals - 04/07/21 1450       SLP SHORT TERM GOAL #1   Title Pt will name 6/10 objects modA over 3 sessions to increase expressive communication.    Time 2    Period Weeks    Status Not Met   Pt is able to make a choice verbally, but may not correctly identify.   Target Date 04/04/21      SLP SHORT TERM GOAL #2   Title Pt will follow through with swallowing saliva before speaking at home as reported by caregiver.    Period Weeks    Status Achieved      SLP SHORT TERM GOAL #3  Title Pt will demonstrate appropriate alternating attention between two tasks with <1 verbal/visual cue.    Time 2    Period Weeks    Status Not Met    Target Date 04/04/21      SLP SHORT TERM GOAL #4   Title Pt will solve basic problems in safety scenarios with minA.    Time 2    Period Weeks    Status Not Met    Target Date 04/04/21      SLP SHORT TERM GOAL #5   Title Pt will match picture to words from a F03 to increase expressive and receptive language with 80% acc.    Time 2    Period Weeks    Status On-going    Target Date 04/21/21              SLP Long Term Goals - 04/07/21 1451       SLP LONG TERM GOAL #1   Title Client will utilize compensatory strategies to communicate wants and needs effectively to different conversational partners and participate socially in functional living environment.    Time 4    Period  Weeks    Status On-going      SLP LONG TERM GOAL #2   Title Client will demonstrate understanding of verbal communication related to daily activities and utilize compensatory strategies to more effectively communicate in their functional living environment.    Time 4    Period Weeks    Status On-going      SLP LONG TERM GOAL #3   Title Patient will develop functional attention skills to effectively attend to and communicate in tasks of daily living in their functional living environment.    Time 4    Period Weeks    Status On-going              Plan - 04/07/21 1449     Clinical Impression Statement Pt required constant stimulation to stay awake. See tx note. Not making improvements towards goals at this time. Pt requires continued services to assist with expressive communication.    Speech Therapy Frequency 2x / week    Duration 4 weeks    Treatment/Interventions Aspiration precaution training;Cueing hierarchy;Patient/family education;Functional tasks;Environmental controls;Cognitive reorganization;Multimodal communcation approach;Language facilitation;Compensatory techniques;Internal/external aids;SLP instruction and feedback;Compensatory strategies    Potential to Achieve Goals Fair    Potential Considerations Ability to learn/carryover information;Severity of impairments    Consulted and Agree with Plan of Care Patient;Family member/caregiver             Patient will benefit from skilled therapeutic intervention in order to improve the following deficits and impairments:   Aphasia  Cognitive communication deficit    Problem List Patient Active Problem List   Diagnosis Date Noted   Ankle edema, bilateral 02/16/2021   Left hemiparesis ([place]) 12/06/2020   Benign essential HTN    Cognitive deficit, post-stroke    Controlled type 2 diabetes mellitus with hyperglycemia, without long-term current use of insulin ([place])    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 Pt matches words to item in 80% of trials if words are completely different(bus, fish, pencil); if words are all CVC (bat, mat, cat) he is unable to select the correct word for the picture.   Goal Update: Not progressing towards goals at this time. Since his last stay in the hospital, pt progress has become stagnant.   Plan: SLP to complete recertification period and provide edu  to wife about next steps.  Reason Skilled Services are Required: SLP rec skilled ST services to address expression so he is able to verbalize his wants and needs effectively.  Rosann Auerbach [place] MS, [place], [name]   04/07/2021, 2:51 PM  [place]. [place], [place], [address] Phone: [phone]   Fax:  [phone]   Name: Joe GLASSBURN Sr. MRN: [id number] Date of Birth: Jul 13, 1951

## 2021-04-07 NOTE — Therapy (Signed)
[place]. [place], [place], [address] Phone: [phone]   Fax:  [phone]  Physical Therapy Treatment  Patient Details  Name: Joe DEMICK Sr. MRN: [id number] Date of Birth: Jan 12, 1951 Referring Provider (PT): Asencion Noble, MD   Encounter Date: 04/07/2021   PT End of Session - 04/07/21 1256     Visit Number 24    PT Start Time 1214    PT Stop Time [id number]    PT Time Calculation (min) 42 min    Activity Tolerance Patient limited by fatigue;Patient limited by lethargy    Behavior During Therapy Flat affect             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 ([place]) 06/03/2019   Cerebrovascular accident (CVA) due to occlusion of vertebral artery ([name])    Diabetes mellitus without complication ([place])    Dysphagia    post stroke   Hypertension    Left upper lobe pneumonia 02/15/2021   Stroke [facility])     Past Surgical History:  Procedure Laterality Date   IR ANGIO INTRA EXTRACRAN SEL COM CAROTID INNOMINATE UNI L MOD SED  03/10/2019   IR ANGIO VERTEBRAL SEL SUBCLAVIAN INNOMINATE BILAT MOD SED  03/10/2019   IR CT HEAD LTD  03/10/2019   IR PERCUTANEOUS ART THROMBECTOMY/INFUSION INTRACRANIAL INC DIAG ANGIO  03/10/2019   RADIOLOGY WITH ANESTHESIA N/A 03/10/2019   Procedure: IR WITH ANESTHESIA/CODE STROKE;  Surgeon: Radiologist, Medication, MD;  Location: [facility];  Service: Radiology;  Laterality: N/A;    There were no vitals filed for this visit.   Subjective Assessment - 04/07/21 1256     Subjective No new complaints    Currently in Pain? No/denies                               [facility] Adult PT Treatment/Exercise - 04/07/21 0001       Ambulation/Gait   Gait Comments 25 ft x 6  in clinic with turns at end with blackTB wrap to encourage LLE flexion, decrease  hyperextension      Therapeutic Activites    Other Therapeutic Activities Standing reaches with RUE x 10 overhead for extension. seatedPT assisted trunk exte stretch 5 x 5" x 3 sets. with Overhead reach  PT assist of stretch into soulder/trunk extension gentle - 2 sets. mod SLS with 4" step 10" x 3 with 1 UE support and CGA. STS with 4" step under right leg to encourage LLE strength and control  from elevated mat table x10 set 1 with BE support on RW, 2nd set with LUE on walker, RUE on thigh      Knee/Hip Exercises: Seated   Hamstring Curl Both;1 set;15 reps    Hamstring Limitations green                      PT Short Term Goals - 12/23/20 1748       PT SHORT TERM GOAL #1   Title Pt will perform HEP with wife's assistance, for improved strength, balance, gait.    Time 4    Period Weeks    Status On-going   wife reports difficulty with getting Mr Teaney to do exercises at home              PT  Long Term Goals - 03/23/21 1418       PT LONG TERM GOAL #1   Title Pt will perform progressive HEP with family supervision and assistance for improved balance, strength, gait.    Status On-going      PT LONG TERM GOAL #2   Title Pt will improve 5TSTS score to </= 20 seconds to demonstrate improved functional strength and decreased fall risk    Baseline Initial 54". 12/23/20: 35 seconds with arms of chair BUE support for push up. 02/08/21: 28Sec with BUE assist on mat to push up. 03/23/21: 33.22 sec with BUE assist on arms of chair    Status On-going      PT LONG TERM GOAL #3   Title Pt will improve TUG score to less than or equal to 25 seconds for decreased fall risk.    Baseline Initial 59 sec. 12/23/20: 55 seconds. 02/08/21: 44 seconds - most difficulty and time with turns. 03/23/21: 76 sec most difficulty and time with turns    Status On-going                   Plan - 04/07/21 1257     Clinical Impression Statement Did demo slight improvement in midline with gait  today. With wlaking practice with TB assit for LLE good clearance and better pacing of steps also with turns. Did notice some R knee hyperext today in stance but we were able to achieve more neutral posture with gait with improved LLE advancement and pacing.    PT Treatment/Interventions ADLs/Self Care Home Management;DME Instruction;Neuromuscular re-education;Balance training;Therapeutic exercise;Therapeutic activities;Functional mobility training;Stair training;Gait training;Patient/family education;Energy conservation;Manual techniques    PT Next Visit Plan continue to work on his awareness of posture and work on this, also look to see if we can get better control of the left knee with walking - possibly use ace wrap or TB wrapping technique to prevent hyperextension in stance during gait training. More stretching of right side and knee control    Consulted and Agree with Plan of Care Patient;Family member/caregiver    Family Member Consulted wife             Patient will benefit from skilled therapeutic intervention in order to improve the following deficits and impairments:  Abnormal gait, Decreased coordination, Difficulty walking, Decreased safety awareness, Decreased balance, Decreased strength, Decreased mobility  Visit Diagnosis: Unsteadiness on feet  Other abnormalities of gait and mobility  Ataxic gait  Abnormal posture  Muscle weakness (generalized)     Problem List Patient Active Problem List   Diagnosis Date Noted   Ankle edema, bilateral 02/16/2021   Left hemiparesis ([place]) 12/06/2020   Benign essential HTN    Cognitive deficit, post-stroke    Controlled type 2 diabetes mellitus with hyperglycemia, without long-term current use of insulin ([place])    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 [place], [place], [address] Phone: [phone]   Fax:  [phone]  Name: Joe RODNEY Sr. MRN: [id number] Date of Birth: 1951-02-11

## 2021-04-12 ENCOUNTER — Encounter: Payer: Self-pay | Admitting: Speech Pathology

## 2021-04-12 ENCOUNTER — Other Ambulatory Visit: Payer: Self-pay

## 2021-04-12 ENCOUNTER — Ambulatory Visit: Payer: Medicare Other | Admitting: Speech Pathology

## 2021-04-12 ENCOUNTER — Ambulatory Visit: Payer: Medicare Other | Admitting: Occupational Therapy

## 2021-04-12 DIAGNOSIS — R2689 Other abnormalities of gait and mobility: Secondary | ICD-10-CM | POA: Diagnosis not present

## 2021-04-12 DIAGNOSIS — R4701 Aphasia: Secondary | ICD-10-CM | POA: Diagnosis not present

## 2021-04-12 DIAGNOSIS — I69319 Unspecified symptoms and signs involving cognitive functions following cerebral infarction: Secondary | ICD-10-CM | POA: Diagnosis not present

## 2021-04-12 DIAGNOSIS — M6281 Muscle weakness (generalized): Secondary | ICD-10-CM | POA: Diagnosis not present

## 2021-04-12 DIAGNOSIS — R41841 Cognitive communication deficit: Secondary | ICD-10-CM

## 2021-04-12 DIAGNOSIS — R293 Abnormal posture: Secondary | ICD-10-CM | POA: Diagnosis not present

## 2021-04-12 DIAGNOSIS — R260 Ataxic gait: Secondary | ICD-10-CM | POA: Diagnosis not present

## 2021-04-12 DIAGNOSIS — R278 Other lack of coordination: Secondary | ICD-10-CM | POA: Diagnosis not present

## 2021-04-12 DIAGNOSIS — R41842 Visuospatial deficit: Secondary | ICD-10-CM | POA: Diagnosis not present

## 2021-04-12 DIAGNOSIS — R2681 Unsteadiness on feet: Secondary | ICD-10-CM | POA: Diagnosis not present

## 2021-04-12 NOTE — Therapy (Signed)
[place]. [place], [place], [address] Phone: [phone]   Fax:  [phone]  Speech Language Pathology Treatment  Patient Details  Name: Joe WALE Sr. MRN: [id number] Date of Birth: 06-10-1951 Referring Provider (SLP): Elsie Stain, MD   Encounter Date: 04/12/2021   End of Session - 04/12/21 1351     Visit Number 31    Number of Visits --   Asking for 16 visits x2/week for 8 weeks.   Date for SLP Re-Evaluation 04/21/21    Authorization Time Period 04/21/21    Authorization - Visit Number 5    Authorization - Number of Visits 8    SLP Start Time 2542    SLP Stop Time  7062    SLP Time Calculation (min) 40 min    Activity Tolerance Patient limited by fatigue;Patient limited by lethargy             Past Medical History:  Diagnosis Date   Acute metabolic encephalopathy 3/76/2831   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 ([place]) 06/03/2019   Cerebrovascular accident (CVA) due to occlusion of vertebral artery ([name])    Diabetes mellitus without complication ([place])    Dysphagia    post stroke   Hypertension    Left upper lobe pneumonia 02/15/2021   Stroke [facility])     Past Surgical History:  Procedure Laterality Date   IR ANGIO INTRA EXTRACRAN SEL COM CAROTID INNOMINATE UNI L MOD SED  03/10/2019   IR ANGIO VERTEBRAL SEL SUBCLAVIAN INNOMINATE BILAT MOD SED  03/10/2019   IR CT HEAD LTD  03/10/2019   IR PERCUTANEOUS ART THROMBECTOMY/INFUSION INTRACRANIAL INC DIAG ANGIO  03/10/2019   RADIOLOGY WITH ANESTHESIA N/A 03/10/2019   Procedure: IR WITH ANESTHESIA/CODE STROKE;  Surgeon: Radiologist, Medication, MD;  Location: [place];  Service: Radiology;  Laterality: N/A;    There were no vitals filed for this visit.   Subjective Assessment - 04/12/21 1322     Subjective "not a whole lot"    Currently in Pain? No/denies                   ADULT SLP TREATMENT -  04/12/21 0001       General Information   Behavior/Cognition Lethargic      Treatment Provided   Treatment provided Cognitive-Linquistic      Cognitive-Linquistic Treatment   Treatment focused on Aphasia    Skilled Treatment Assessed naming objects (7/16 independently; 13/16 with sentence completion cue). Pt continues to demonstrate difficulty with focused/sustained and visual attention. Pt was able to choose the correct word to match a verbally and visually presented object from a F02 with 80% acc.      Assessment / Recommendations / Plan   Plan Continue with current plan of care      Progression Toward Goals   Progression toward goals Not progressing toward goals (comment)                SLP Short Term Goals - 04/12/21 1353       SLP SHORT TERM GOAL #1   Title Pt will name 6/10 objects modA over 3 sessions to increase expressive communication.    Time 2    Period Weeks    Status Not Met   Pt is able to make a choice verbally, but may not correctly identify.   Target Date 04/04/21      SLP SHORT TERM GOAL #2  Title Pt will follow through with swallowing saliva before speaking at home as reported by caregiver.    Period Weeks    Status Achieved      SLP SHORT TERM GOAL #3   Title Pt will demonstrate appropriate alternating attention between two tasks with <1 verbal/visual cue.    Time 2    Period Weeks    Status Not Met    Target Date 04/04/21      SLP SHORT TERM GOAL #4   Title Pt will solve basic problems in safety scenarios with minA.    Time 2    Period Weeks    Status Not Met    Target Date 04/04/21      SLP SHORT TERM GOAL #5   Title Pt will match picture to words from a F03 to increase expressive and receptive language with 80% acc.    Time 2    Period Weeks    Status Revised    Target Date 04/21/21              SLP Long Term Goals - 04/12/21 1354       SLP LONG TERM GOAL #1   Title Client will utilize compensatory strategies to  communicate wants and needs effectively to different conversational partners and participate socially in functional living environment.    Time 4    Period Weeks    Status On-going    Target Date 04/21/21      SLP LONG TERM GOAL #2   Title Client will demonstrate understanding of verbal communication related to daily activities and utilize compensatory strategies to more effectively communicate in their functional living environment.    Time 4    Period Weeks    Status On-going    Target Date 04/21/21      SLP LONG TERM GOAL #3   Title Patient will develop functional attention skills to effectively attend to and communicate in tasks of daily living in their functional living environment.    Time 4    Period Weeks    Status On-going    Target Date 04/21/21              Plan - 04/12/21 1351     Clinical Impression Statement Pt and SLP discussed motivation. Pt reported he wasn't feeling motivated; however, when asked if he wanted to continue therapy, pt reported, "yes". Pt answers are inconsistent. He began to fall asleep with 5 min left in session. Pt requires continued services to assist with expressive communication.    Speech Therapy Frequency 2x / week    Duration 4 weeks    Treatment/Interventions Aspiration precaution training;Cueing hierarchy;Patient/family education;Functional tasks;Environmental controls;Cognitive reorganization;Multimodal communcation approach;Language facilitation;Compensatory techniques;Internal/external aids;SLP instruction and feedback;Compensatory strategies    Potential to Achieve Goals Fair    Potential Considerations Ability to learn/carryover information;Severity of impairments    Consulted and Agree with Plan of Care Patient;Family member/caregiver             Patient will benefit from skilled therapeutic intervention in order to improve the following deficits and impairments:   Aphasia  Cognitive communication deficit    Problem  List Patient Active Problem List   Diagnosis Date Noted   Ankle edema, bilateral 02/16/2021   Left hemiparesis ([place]) 12/06/2020   Benign essential HTN    Cognitive deficit, post-stroke    Controlled type 2 diabetes mellitus with hyperglycemia, without long-term current use of insulin ([place])    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 [place], [place], [address] Phone: [phone]   Fax:  [phone]   Name: Joe BERNHARD Sr. MRN: [id number] Date of Birth: 07-Feb-1951

## 2021-04-13 NOTE — Therapy (Signed)
[address]. [place], [place], [address] Phone: [phone]   Fax:  [phone]  Occupational Therapy Treatment  Patient Details  Name: Joe HUIZAR Sr. MRN: [id number] Date of Birth: [birth year] Referring Provider (OT): Asencion Noble, MD   Encounter Date: 04/12/2021   OT End of Session - 04/12/21 1404     Visit Number 7    Number of Visits 13    Date for OT Re-Evaluation 05/11/21    Authorization Type UHC Medicare; BCBS state (secondary)    Authorization Time Period VL: [place]    Progress Note Due on Visit 10    OT Start Time 1402    OT Stop Time 1443    OT Time Calculation (min) 41 min    Activity Tolerance Patient tolerated treatment well    Behavior During Therapy Flat affect             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 ([place]) 06/03/2019   Cerebrovascular accident (CVA) due to occlusion of vertebral artery ([name])    Diabetes mellitus without complication ([place])    Dysphagia    post stroke   Hypertension    Left upper lobe pneumonia 02/15/2021   Stroke [facility])     Past Surgical History:  Procedure Laterality Date   IR ANGIO INTRA EXTRACRAN SEL COM CAROTID INNOMINATE UNI L MOD SED  03/10/2019   IR ANGIO VERTEBRAL SEL SUBCLAVIAN INNOMINATE BILAT MOD SED  03/10/2019   IR CT HEAD LTD  03/10/2019   IR PERCUTANEOUS ART THROMBECTOMY/INFUSION INTRACRANIAL INC DIAG ANGIO  03/10/2019   RADIOLOGY WITH ANESTHESIA N/A 03/10/2019   Procedure: IR WITH ANESTHESIA/CODE STROKE;  Surgeon: Radiologist, Medication, MD;  Location: [facility];  Service: Radiology;  Laterality: N/A;    There were no vitals filed for this visit.   Subjective Assessment - 04/12/21 1404     Subjective  Pt nodded his head, indicating "yes" when asked whether he completes dressing w/out assist at home    Patient is  accompanied by: Family member   Wife [name]   Pertinent History Hx of CVA (2017, March 2020, July 2020); T2DM; HTN    Limitations Fall risk; L homonymous hemianopia (per chart review); dysphagia; cognitive-communication deficit    Patient Stated Goals Get stronger; improve walking; improve independence    Currently in Pain? No/denies             Treatment/Exercises - 04/12/21    IADL: Folding Laundry Completed functional folding task while standing at elevated tabletop to facilitate bilateral integration, standing tolerance, and increased independence w/ functional activities. Pt was able to tolerate 6 min of standing before requiring a rest break; demo'd fair sequencing, requiring occasional cues for success/completion, and good bilateral coordination  OT downgraded demand of activity w/ pt completing folding of simple clothing items while seated to emphasize sequencing of task w/ fair results; OT provided scaffolding, verbal/auditory cues, and extended time for processing for success    ADL: LB Dressing Donned/doffed shorts w/ SPV and significant extended time for processing. Pt demo'd primary difficulty w/ wt shifting when threading/un-treading BLEs; to facilitate practice, pt completed simulated task w/ towel. Able to complete 3x on each side w/ extended time and verbal cues for wt shifting; demo'd good problem solving throughout    Weight Shifting Activity Large amplitude wt shifting to L side to retrieve standard  cones w/ LUE and stack at midline; demo'd consistent wt shift to L side w/out cues and required repetition for working memory to [employer]. Activity also facilitated [place].            OT Education - 04/12/21 1600     Education Details Caregiver education provided at conclusion of session; encouraged pt and spouse to allow pt to continue to engage in certain IADL tasks safely at home as able    Person(s) Educated Spouse    Methods Explanation    Comprehension Verbalized  understanding             OT Short Term Goals - 04/12/21 1424       [place] #1   Title Pt will be able to sequence through one BADL task (e.g., donning pants, washing UB) w/ SPV in at least 2 trials    Baseline Min A w/ BADL tasks    Time 6    Period Weeks    Status Achieved   04/12/21 - LB dressing   Target Date 04/01/21      OT SHORT TERM GOAL #2   Title Pt will improve participation in functional FM tasks as evidenced by completing 9-HPT w/ L hand in less than 3 minutes    Baseline 9-HPT w/ R hand (1 min, 14 sec); L hand (>3 min)    Time 6    Period Weeks    Status Achieved   04/05/21 - 1 min, 46 sec w/ LUE/1 drop     OT SHORT TERM GOAL #3   Title Pt will perform simple visual scanning at table with at least 90% accuracy.    Baseline Visual-perceptual deficits    Time 6    Period Weeks    Status On-going   03/29/21 - 67% accuracy     OT SHORT TERM GOAL #4   Title Pt will improve GM coordination during BADLs as evidenced by increasing Box and Blocks test by at least 4 blocks    Baseline Box and Blocks (30 seconds) w/ R hand (9); L hand (4)    Time 6    Period Weeks    Status On-going   03/29/21 - RUE 12; LUE 6            OT Long Term Goals - 03/21/21 1134       OT LONG TERM GOAL #1   Title Pt will be able to participate in HEP for strength, coordination, and visual-perception w/ SPV    Baseline No HEP at this time    Time 12    Period Weeks    Status On-going      OT LONG TERM GOAL #2   Title Pt will improve participation in functional FM tasks as evidenced by decreasing 9-HPT time w/ R, dominant hand by at least 14 seconds    Baseline 9-HPT w/ R hand (1 min, 14 sec); L hand (>3 min)    Time 12    Period Weeks    Status On-going      OT LONG TERM GOAL #3   Title Pt will be able to complete simple IADL task (e.g., washing plastic dish, folding towel) w/ SPV for safety in at least 2 trials    Baseline Very minimal participation in IADLs at this time     Time 12    Period Weeks    Status On-going      OT LONG TERM GOAL #4  Title Pt will demonstrate full BADL activity (dressing, bathing, toileting) w/ SPV for safety    Baseline Completing BADLs w/ Min A, per spouse report    Time 12    Period Weeks    Status On-going             Plan - 04/12/21 1434     Clinical Impression Statement Pt demo'd min difficulty w/ wt shift to L side in order to lift RLE when threading LE into shorts. To facilitate increased awareness of L side, OT facilitated wt shift activity w/ cones; pt demo'd appropriate leaning toward L side w/out cueing. Pt continues to benefit from extended time for processing, cueing, and repetition.    OT Occupational Profile and History Detailed Assessment- Review of Records and additional review of physical, cognitive, psychosocial history related to current functional performance    Occupational performance deficits (Please refer to evaluation for details): ADL's;IADL's;Work;Leisure;Social Participation    Body Structure / Function / Physical Skills ADL;ROM;UE functional use;Balance;Decreased knowledge of use of DME;FMC;Mobility;Vestibular;Body mechanics;Dexterity;Gait;Sensation;Vision;GMC;Strength;Proprioception;IADL;Coordination    Cognitive Skills Attention;Problem Solve;Safety Awareness;Sequencing    Rehab Potential Good    Clinical Decision Making Several treatment options, min-mod task modification necessary    Comorbidities Affecting Occupational Performance: May have comorbidities impacting occupational performance    Modification or Assistance to Complete Evaluation  Min-Moderate modification of tasks or assist with assess necessary to complete eval    OT Frequency 1x / week    OT Duration 12 weeks    OT Treatment/Interventions Self-care/ADL training;Therapeutic exercise;Visual/perceptual remediation/compensation;Aquatic Therapy;Moist Heat;Cryotherapy;Neuromuscular education;Patient/family  education;Fluidtherapy;Energy conservation;Therapist, nutritional;Therapeutic activities;Balance training;DME and/or AE instruction;Manual Therapy;Passive range of motion;Cognitive remediation/compensation    Plan Re-assess Box and Blocks and visual sanning; continue w/ large amplitude movements and LUE wt-bearing/shifting for increased L-sided awareness    Consulted and Agree with Plan of Care Patient;Family member/caregiver    Family Member Consulted Wife Transport planner)             Patient will benefit from skilled therapeutic intervention in order to improve the following deficits and impairments:   Body Structure / Function / Physical Skills: ADL, ROM, UE functional use, Balance, Decreased knowledge of use of DME, FMC, Mobility, Vestibular, Body mechanics, Dexterity, Gait, Sensation, Vision, GMC, Strength, Proprioception, IADL, Coordination Cognitive Skills: Attention, Problem Solve, Safety Awareness, Sequencing   Visit Diagnosis: Unspecified symptoms and signs involving cognitive functions following cerebral infarction  Other lack of coordination  Muscle weakness (generalized)  Visuospatial deficit    Problem List Patient Active Problem List   Diagnosis Date Noted   Ankle edema, bilateral 02/16/2021   Left hemiparesis ([place]) 12/06/2020   Benign essential HTN    Cognitive deficit, post-stroke    Controlled type 2 diabetes mellitus with hyperglycemia, without long-term current use of insulin ([place])    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 [place], [place], [address] Phone: [phone]   Fax:  [phone]  Name: Joe RUTIGLIANO Sr. MRN: [id number] Date of Birth: Jul 10, 1951

## 2021-04-14 ENCOUNTER — Ambulatory Visit: Payer: Medicare Other | Admitting: Speech Pathology

## 2021-04-14 ENCOUNTER — Encounter: Payer: Self-pay | Admitting: Physical Therapy

## 2021-04-14 ENCOUNTER — Other Ambulatory Visit: Payer: Self-pay

## 2021-04-14 ENCOUNTER — Ambulatory Visit: Payer: Medicare Other | Admitting: Physical Therapy

## 2021-04-14 ENCOUNTER — Encounter: Payer: Self-pay | Admitting: Speech Pathology

## 2021-04-14 DIAGNOSIS — R41841 Cognitive communication deficit: Secondary | ICD-10-CM

## 2021-04-14 DIAGNOSIS — R293 Abnormal posture: Secondary | ICD-10-CM | POA: Diagnosis not present

## 2021-04-14 DIAGNOSIS — M6281 Muscle weakness (generalized): Secondary | ICD-10-CM

## 2021-04-14 DIAGNOSIS — R41842 Visuospatial deficit: Secondary | ICD-10-CM | POA: Diagnosis not present

## 2021-04-14 DIAGNOSIS — R278 Other lack of coordination: Secondary | ICD-10-CM

## 2021-04-14 DIAGNOSIS — R2689 Other abnormalities of gait and mobility: Secondary | ICD-10-CM | POA: Diagnosis not present

## 2021-04-14 DIAGNOSIS — R4701 Aphasia: Secondary | ICD-10-CM

## 2021-04-14 DIAGNOSIS — R2681 Unsteadiness on feet: Secondary | ICD-10-CM

## 2021-04-14 DIAGNOSIS — R260 Ataxic gait: Secondary | ICD-10-CM | POA: Diagnosis not present

## 2021-04-14 DIAGNOSIS — I69319 Unspecified symptoms and signs involving cognitive functions following cerebral infarction: Secondary | ICD-10-CM | POA: Diagnosis not present

## 2021-04-14 NOTE — Therapy (Signed)
[address]. [place], [place], [address] Phone: [phone]   Fax:  [phone]  Physical Therapy Treatment  Patient Details  Name: Joe MONNIER Sr. 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 ([place]) 06/03/2019   Cerebrovascular accident (CVA) due to occlusion of vertebral artery ([name])    Diabetes mellitus without complication ([place])    Dysphagia    post stroke   Hypertension    Left upper lobe pneumonia 02/15/2021   Stroke [facility])     Past Surgical History:  Procedure Laterality Date   IR ANGIO INTRA EXTRACRAN SEL COM CAROTID INNOMINATE UNI L MOD SED  03/10/2019   IR ANGIO VERTEBRAL SEL SUBCLAVIAN INNOMINATE BILAT MOD SED  03/10/2019   IR CT HEAD LTD  03/10/2019   IR PERCUTANEOUS ART THROMBECTOMY/INFUSION INTRACRANIAL INC DIAG ANGIO  03/10/2019   RADIOLOGY WITH ANESTHESIA N/A 03/10/2019   Procedure: IR WITH ANESTHESIA/CODE STROKE;  Surgeon: Radiologist, Medication, MD;  Location: [place] [facility];  Service: Radiology;  Laterality: N/A;    There were no vitals filed for this visit.   Subjective Assessment - 04/14/21 1449     Subjective Wife reports a fall out of his chair last weekend.  No injuries, she reports that he shuffles a lot at home    Currently in Pain? No/denies                               [facility] Adult PT  Treatment/Exercise - 04/14/21 0001       Ambulation/Gait   Gait Comments with HHA x 110 feet cues for step length and head up, did this 3 x, practiced stairs      High Level Balance   High Level Balance Activities Side stepping;Backward walking    High Level Balance Comments seated ball toss, standing cone toe touches using the walkier, standing backs of knees touching chair ball toss using walker                      PT Short Term Goals - 12/23/20 1748       PT SHORT TERM GOAL #1   Title Pt will perform HEP with wife's assistance, for improved strength, balance, gait.    Time 4    Period Weeks    Status On-going   wife reports difficulty with getting Mr Zuercher to do exercises at home              PT Long Term Goals - 04/14/21 1529       PT LONG TERM GOAL #1   Title Pt will perform progressive HEP with family supervision and assistance for improved balance, strength, gait.    Status On-going  PT LONG TERM GOAL #2   Title Pt will improve 5TSTS score to </= 20 seconds to demonstrate improved functional strength and decreased fall risk    Status On-going      PT LONG TERM GOAL #3   Title Pt will improve TUG score to less than or equal to 25 seconds for decreased fall risk.    Status Partially Met                   Plan - 04/14/21 1526     Clinical Impression Statement Patient still shuffling and with head down for most of the time unless given cues, He will do okay with decreased shuffling during HHA type gait but this is because I can give very suttle weight shift cues to the right to assist with the foot advancement.  I tried some higher level balance and he did well, with LOB he was able to correct with using arms and or a small step  He did have a fall over the weekend according to his wife    PT Next Visit Plan continue to work on his awareness of posture and work on this, also look to see if we can get better control of the left knee with  walking - possibly use ace wrap or TB wrapping technique to prevent hyperextension in stance during gait training. More stretching of right side and knee control    Consulted and Agree with Plan of Care Patient;Family member/caregiver    Family Member Consulted wife             Patient will benefit from skilled therapeutic intervention in order to improve the following deficits and impairments:  Abnormal gait, Decreased coordination, Difficulty walking, Decreased safety awareness, Decreased balance, Decreased strength, Decreased mobility  Visit Diagnosis: Other lack of coordination  Muscle weakness (generalized)  Unsteadiness on feet  Ataxic gait  Abnormal posture     Problem List Patient Active Problem List   Diagnosis Date Noted   Ankle edema, bilateral 02/16/2021   Left hemiparesis ([place]) 12/06/2020   Benign essential HTN    Cognitive deficit, post-stroke    Controlled type 2 diabetes mellitus with hyperglycemia, without long-term current use of insulin ([place])    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 [place], [place], [address] Phone: [phone]   Fax:  [phone]  Name: Joe HYAMS Sr. MRN: [id number] Date of Birth: 1951/02/21

## 2021-04-14 NOTE — Therapy (Signed)
[place]. [place], [place], [address] Phone: [phone]   Fax:  [phone]  Speech Language Pathology Treatment  Patient Details  Name: Joe BARTO Sr. MRN: [id number] Date of Birth: 1950-11-03 Referring Provider (SLP): Elsie Stain, MD   Encounter Date: 04/14/2021   End of Session - 04/14/21 1548     Visit Number 32    Number of Visits --   Asking for 16 visits x2/week for 8 weeks.   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 ([name])    Diabetes mellitus without complication ([place])    Dysphagia    post stroke   Hypertension    Left upper lobe pneumonia 02/15/2021   Stroke [facility])     Past Surgical History:  Procedure Laterality Date   IR ANGIO INTRA EXTRACRAN SEL COM CAROTID INNOMINATE UNI L MOD SED  03/10/2019   IR ANGIO VERTEBRAL SEL SUBCLAVIAN INNOMINATE BILAT MOD SED  03/10/2019   IR CT HEAD LTD  03/10/2019   IR PERCUTANEOUS ART THROMBECTOMY/INFUSION INTRACRANIAL INC DIAG ANGIO  03/10/2019   RADIOLOGY WITH ANESTHESIA N/A 03/10/2019   Procedure: IR WITH ANESTHESIA/CODE STROKE;  Surgeon: Radiologist, Medication, MD;  Location: [facility];  Service: Radiology;  Laterality: N/A;    There were no vitals filed for this visit.          ADULT SLP TREATMENT - 04/14/21 1546       General Information   Behavior/Cognition Alert      Treatment Provided   Treatment provided  Cognitive-Linquistic      Cognitive-Linquistic Treatment   Treatment focused on Aphasia    Skilled Treatment Pt was more interactive in today's session. SLP facilitated verbal expression and cognition byy assessing pt with problem solving. If answered wrong, SLP and pt problem solved together. Required modA to answer problem solving questions. Pt exhibited perseverations throughout session; however, was ultimately about to convey his meaning.      Assessment / Recommendations / Plan   Plan Continue with current plan of care      Progression Toward Goals   Progression toward goals Progressing toward goals                SLP Short Term Goals - 04/14/21 1526       SLP SHORT TERM GOAL #1   Title Pt will name 6/10 objects modA over 3 sessions to increase expressive communication.    Time 2    Period Weeks    Status On-going   Pt is able to make a choice verbally, but may not correctly identify.   Target Date 04/04/21      SLP SHORT TERM GOAL #2   Title Pt will follow through with swallowing saliva before speaking at home as reported by caregiver.    Period Weeks    Status Achieved  SLP SHORT TERM GOAL #3   Title Pt will demonstrate appropriate alternating attention between two tasks with <1 verbal/visual cue.    Time 2    Period Weeks    Status Not Met    Target Date 04/04/21      SLP SHORT TERM GOAL #4   Title Pt will solve basic problems in safety scenarios with minA.    Time 2    Period Weeks    Status On-going    Target Date 04/04/21      SLP SHORT TERM GOAL #5   Title Pt will match picture to words from a F02 to increase expressive and receptive language with 80% acc.    Time 2    Period Weeks    Status Revised    Target Date 04/21/21              SLP Long Term Goals - 04/12/21 1354       SLP LONG TERM GOAL #1   Title Client will utilize compensatory strategies to communicate wants and needs effectively to different conversational partners and  participate socially in functional living environment.    Time 4    Period Weeks    Status On-going    Target Date 04/21/21      SLP LONG TERM GOAL #2   Title Client will demonstrate understanding of verbal communication related to daily activities and utilize compensatory strategies to more effectively communicate in their functional living environment.    Time 4    Period Weeks    Status On-going    Target Date 04/21/21      SLP LONG TERM GOAL #3   Title Patient will develop functional attention skills to effectively attend to and communicate in tasks of daily living in their functional living environment.    Time 4    Period Weeks    Status On-going    Target Date 04/21/21              Plan - 04/14/21 1551     Clinical Impression Statement See tx note. Pt was much more alert and engaged this session. PT reported patient had a fall.  Pt requires continued services to assist with expressive communication.    Speech Therapy Frequency 2x / week    Duration 4 weeks    Treatment/Interventions Aspiration precaution training;Cueing hierarchy;Patient/family education;Functional tasks;Environmental controls;Cognitive reorganization;Multimodal communcation approach;Language facilitation;Compensatory techniques;Internal/external aids;SLP instruction and feedback;Compensatory strategies    Potential to Achieve Goals Fair    Potential Considerations Ability to learn/carryover information;Severity of impairments    Consulted and Agree with Plan of Care Patient;Family member/caregiver             Patient will benefit from skilled therapeutic intervention in order to improve the following deficits and impairments:   Aphasia  Cognitive communication deficit    Problem List Patient Active Problem List   Diagnosis Date Noted   Ankle edema, bilateral 02/16/2021   Left hemiparesis ([place]) 12/06/2020   Benign essential HTN    Cognitive deficit, post-stroke    Controlled type 2  diabetes mellitus with hyperglycemia, without long-term current use of insulin ([place])    Vestibular schwannoma ([employer]) 06/03/2019   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 [place], [place], [address] Phone: [phone]   Fax:  [phone]   Name: Joe HAVLIN Sr. MRN: [id number] Date of Birth: 09-03-1951

## 2021-04-18 ENCOUNTER — Other Ambulatory Visit: Payer: Self-pay | Admitting: Critical Care Medicine

## 2021-04-18 DIAGNOSIS — I69319 Unspecified symptoms and signs involving cognitive functions following cerebral infarction: Secondary | ICD-10-CM

## 2021-04-19 ENCOUNTER — Ambulatory Visit: Payer: Medicare Other | Admitting: Physical Therapy

## 2021-04-19 ENCOUNTER — Encounter: Payer: Self-pay | Admitting: Physical Therapy

## 2021-04-19 ENCOUNTER — Encounter: Payer: Self-pay | Admitting: Speech Pathology

## 2021-04-19 ENCOUNTER — Other Ambulatory Visit: Payer: Self-pay

## 2021-04-19 ENCOUNTER — Ambulatory Visit: Payer: Medicare Other | Admitting: Speech Pathology

## 2021-04-19 VITALS — BP 132/80 | HR 64

## 2021-04-19 DIAGNOSIS — R2681 Unsteadiness on feet: Secondary | ICD-10-CM | POA: Diagnosis not present

## 2021-04-19 DIAGNOSIS — R41841 Cognitive communication deficit: Secondary | ICD-10-CM

## 2021-04-19 DIAGNOSIS — R278 Other lack of coordination: Secondary | ICD-10-CM | POA: Diagnosis not present

## 2021-04-19 DIAGNOSIS — R293 Abnormal posture: Secondary | ICD-10-CM

## 2021-04-19 DIAGNOSIS — M6281 Muscle weakness (generalized): Secondary | ICD-10-CM

## 2021-04-19 DIAGNOSIS — R4701 Aphasia: Secondary | ICD-10-CM | POA: Diagnosis not present

## 2021-04-19 DIAGNOSIS — I69319 Unspecified symptoms and signs involving cognitive functions following cerebral infarction: Secondary | ICD-10-CM | POA: Diagnosis not present

## 2021-04-19 DIAGNOSIS — R2689 Other abnormalities of gait and mobility: Secondary | ICD-10-CM

## 2021-04-19 DIAGNOSIS — R260 Ataxic gait: Secondary | ICD-10-CM

## 2021-04-19 DIAGNOSIS — R41842 Visuospatial deficit: Secondary | ICD-10-CM | POA: Diagnosis not present

## 2021-04-19 NOTE — Therapy (Signed)
[place]. [place], [place], [address] Phone: [phone]   Fax:  [phone]  Speech Language Pathology Treatment  Patient Details  Name: Joe BERKOVICH Sr. MRN: [id number] Date of Birth: [birth year] Referring Provider (SLP): Elsie Stain, MD   Encounter Date: 04/19/2021   End of Session - 04/19/21 1536     Visit Number 33    Number of Visits --   Asking for 16 visits x2/week for 8 weeks.   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 ([place]) 06/03/2019   Cerebrovascular accident (CVA) due to occlusion of vertebral artery ([name])    Diabetes mellitus without complication ([place])    Dysphagia    post stroke   Hypertension    Left upper lobe pneumonia 02/15/2021   Stroke [facility])     Past Surgical History:  Procedure Laterality Date   IR ANGIO INTRA EXTRACRAN SEL COM CAROTID INNOMINATE UNI L MOD SED  03/10/2019   IR ANGIO VERTEBRAL SEL SUBCLAVIAN INNOMINATE BILAT MOD SED  03/10/2019   IR CT HEAD LTD  03/10/2019   IR PERCUTANEOUS ART THROMBECTOMY/INFUSION INTRACRANIAL INC DIAG ANGIO  03/10/2019   RADIOLOGY WITH ANESTHESIA N/A 03/10/2019   Procedure: IR WITH ANESTHESIA/CODE STROKE;  Surgeon: Radiologist, Medication, MD;  Location: [place];  Service: Radiology;  Laterality: N/A;    There were no vitals filed for this visit.   Subjective Assessment - 04/19/21 1530     Subjective Wife attended session with pt to discuss next steps in therapy plan of care.    Patient is  accompained by: Family member    Currently in Pain? No/denies                   ADULT SLP TREATMENT - 04/19/21 1531       General Information   Behavior/Cognition Lethargic;Cooperative;Pleasant mood      Treatment Provided   Treatment provided Cognitive-Linquistic      Cognitive-Linquistic Treatment   Treatment focused on Aphasia    Skilled Treatment Edu provided to pt and pt wife regarding next steps in plan of care. Wife reported pt continues not to communicate with her at home SLP edu on the importance of providing choices for wants/needs. Pt was not oriented to month today, but was oriented to year. Pt used multimodal communication (written white board) to communicate the correct year. Pt required maxA to verablize steps required to change a tire. Pt benefitted from verbal and visual prompts.                SLP Short Term Goals - 04/19/21 1540       SLP SHORT TERM GOAL #1   Title Pt will name 6/10 objects modA over 3 sessions to increase expressive communication.    Time 2    Period Weeks    Status Not Met   Pt is able to make a choice verbally, but may  not correctly identify.   Target Date 04/04/21      SLP SHORT TERM GOAL #2   Title Pt will follow through with swallowing saliva before speaking at home as reported by caregiver.    Period Weeks    Status Achieved      SLP SHORT TERM GOAL #3   Title Pt will demonstrate appropriate alternating attention between two tasks with <1 verbal/visual cue.    Time 2    Period Weeks    Status Not Met    Target Date 04/04/21      SLP SHORT TERM GOAL #4   Title Pt will solve basic problems in safety scenarios with minA.    Time 2    Period Weeks    Status Partially Met    Target Date 04/04/21      SLP SHORT TERM GOAL #5   Title Pt will match picture to words from a F02 to increase expressive and receptive language with 80% acc.    Time 2    Period Weeks    Status Not Met    Target Date 04/21/21               SLP Long Term Goals - 04/12/21 1354       SLP LONG TERM GOAL #1   Title Client will utilize compensatory strategies to communicate wants and needs effectively to different conversational partners and participate socially in functional living environment.    Time 4    Period Weeks    Status On-going    Target Date 04/21/21      SLP LONG TERM GOAL #2   Title Client will demonstrate understanding of verbal communication related to daily activities and utilize compensatory strategies to more effectively communicate in their functional living environment.    Time 4    Period Weeks    Status On-going    Target Date 04/21/21      SLP LONG TERM GOAL #3   Title Patient will develop functional attention skills to effectively attend to and communicate in tasks of daily living in their functional living environment.    Time 4    Period Weeks    Status On-going    Target Date 04/21/21              Plan - 04/19/21 1537     Clinical Impression Statement See tx note. Edu wife and pt on next steps in POC. Wife is in agreement with discharge next session. SLP and pt in agreement that he would benefit from time off therapy due to lack of motivation. Limited progress has been made. Pt requires continued services to assist with expressive communication.    Speech Therapy Frequency 2x / week    Duration 4 weeks    Treatment/Interventions Aspiration precaution training;Cueing hierarchy;Patient/family education;Functional tasks;Environmental controls;Cognitive reorganization;Multimodal communcation approach;Language facilitation;Compensatory techniques;Internal/external aids;SLP instruction and feedback;Compensatory strategies    Potential to Achieve Goals Fair    Potential Considerations Ability to learn/carryover information;Severity of impairments    Consulted and Agree with Plan of Care Patient;Family member/caregiver             Patient will benefit from skilled therapeutic  intervention in order to improve the following deficits and impairments:   Aphasia  Cognitive communication deficit    Problem List Patient Active Problem List   Diagnosis Date Noted   Ankle edema, bilateral 02/16/2021   Left hemiparesis ([place]) 12/06/2020   Benign essential HTN    Cognitive deficit, post-stroke  Controlled type 2 diabetes mellitus with hyperglycemia, without long-term current use of insulin ([name])    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 [place], [place], [address] Phone: [phone]   Fax:  [phone]   Name: Joe DOUTY Sr. MRN: [id number] Date of Birth: 1950/09/05

## 2021-04-19 NOTE — Therapy (Signed)
[place]. [place], [place], [address] Phone: [phone]   Fax:  [phone]  Physical Therapy Treatment  Patient Details  Name: Joe DILONE Sr. MRN: [id number] Date of Birth: [birth year] Referring Provider (PT): Asencion Noble, MD   Encounter Date: 04/19/2021   PT End of Session - 04/19/21 1553     Visit Number 26    Number of Visits 71    Date for PT Re-Evaluation 05/31/21    Authorization Type [employer]    PT Start Time 6962    PT Stop Time 1527    PT Time Calculation (min) 46 min    Equipment Utilized During Treatment Gait belt    Activity Tolerance Patient limited by fatigue;Patient limited by lethargy    Behavior During Therapy Flat affect             Past Medical History:  Diagnosis Date   Acute metabolic encephalopathy 9/52/8413   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 ([place]) 06/03/2019   Cerebrovascular accident (CVA) due to occlusion of vertebral artery ([name])    Diabetes mellitus without complication ([place])    Dysphagia    post stroke   Hypertension    Left upper lobe pneumonia 02/15/2021   Stroke [facility])     Past Surgical History:  Procedure Laterality Date   IR ANGIO INTRA EXTRACRAN SEL COM CAROTID INNOMINATE UNI L MOD SED  03/10/2019   IR ANGIO VERTEBRAL SEL SUBCLAVIAN INNOMINATE BILAT MOD SED  03/10/2019   IR CT HEAD LTD  03/10/2019   IR PERCUTANEOUS ART THROMBECTOMY/INFUSION INTRACRANIAL INC DIAG ANGIO  03/10/2019   RADIOLOGY WITH ANESTHESIA N/A 03/10/2019   Procedure: IR WITH ANESTHESIA/CODE STROKE;  Surgeon: Radiologist, Medication, MD;  Location: [place];  Service: Radiology;  Laterality: N/A;    Vitals:   04/19/21 1515  BP: 132/80  Pulse: 64  SpO2: 98%     Subjective Assessment - 04/19/21 1447     Subjective Wife reports that patient still has difficulty with STS transfers and walking. She would like him to keep working with PT as  she feels that he has potential to improve.    Patient is accompained by: Family member    Currently in Pain? No/denies                [facility] PT Assessment - 04/19/21 0001       Assessment   Medical Diagnosis CVA - R PCA occlusion    Referring Provider (PT) Asencion Noble, MD    Onset Date/Surgical Date 03/10/19      Standardized Balance Assessment   Five times sit to stand comments  35.54 sec with B UE support      Timed Up and Go Test   Normal TUG (seconds) 60.5   with RW                          [facility] Adult PT Treatment/Exercise - 04/19/21 0001       Ambulation/Gait   Gait Comments gait training with RW + CGA with cueing to increase step length and push walker slightly further forward  x 64f, x738f     Knee/Hip Exercises: Standing   Heel Raises Both;10 reps;2 sets    Heel Raises Limitations II bars and CGA    Hip Flexion AROM;Both;2 sets;10 reps    Hip Flexion Limitations marching in II bars    Other  Standing Knee Exercises alt toe tap on cone 2x10 in II bars   CGA; cues to maintain ches tup     Knee/Hip Exercises: Seated   Sit to Sand 1 set;5 reps   pushing on B knees; CGA/intermittent min A for balance and set up                   PT Education - 04/19/21 1538     Education Details discussion with wife on patient's progress and remaining limitations; provided info on adult daycare services including [facility] and [employer]    Person(s) Educated Spouse;Patient    Methods Explanation;Demonstration;Tactile cues;Verbal cues    Comprehension Verbalized understanding;Returned demonstration              PT Short Term Goals - 04/19/21 1704       PT SHORT TERM GOAL #1   Title Pt will perform HEP with wife's assistance, for improved strength, balance, gait.    Time 3    Period Weeks    Status On-going   wife reports difficulty with getting Mr Joe Perry to do exercises at home- suggested caregiver/CNA for support at home and adult daycare services  for improved participation and caregiver respite   Target Date 05/10/21               PT Long Term Goals - 04/19/21 1706       PT LONG TERM GOAL #1   Title Pt will perform progressive HEP with family supervision and assistance for improved balance, strength, gait.    Time 6    Status On-going   trouble with participation at home   Target Date 05/31/21      PT LONG TERM GOAL #2   Title Pt will improve 5TSTS score to </= 20 seconds to demonstrate improved functional strength and decreased fall risk    Baseline Initial 54". 12/23/20: 35 seconds with arms of chair BUE support for push up. 02/08/21: 28Sec with BUE assist on mat to push up. 03/23/21: 33.22 sec with BUE assist on arms of chair. 04/19/21: 35.54 sec with B UE support    Time 6    Status On-going    Target Date 05/31/21      PT LONG TERM GOAL #3   Title Pt will improve TUG score to less than or equal to 25 seconds for decreased fall risk.    Baseline Initial 59 sec. 12/23/20: 55 seconds. 02/08/21: 44 seconds - most difficulty and time with turns. 03/23/21: 76 sec most difficulty and time with turns. 04/19/21: 60.5 sec with RW    Time 6    Status Partially Met    Target Date 05/31/21                   Plan - 04/19/21 1703     Clinical Impression Statement Spoke with wife at start of session who reported that patient is still struggling with ambulation and STS transfers. Notes that she would like him to proceed with more PT visits. Provided wife with information on adult daycare services d/t her report that she gets limited participation from him with exercises activities in the home. Reassessed goals today, which demonstrated small improvement in transfer speed, however 5xSTS still demonstrates falls risk. Gait speed is also improved since initial eval, however still with instability particularly with turning. Patient performed STS with decreased UE support, requiring CGA/intermittent min A for balance and set up. During  standing ther-ex, patient signaled that he was not  feeling well during session, however denied pain and dizziness, SOB. Vitals appears normal, and after a short sit break was able to participate further without issue. Patient is demonstrating good progress towards goals. Would benefit from additional skilled PT services 2x/week for 6 weeks to address remaining goals.    Comorbidities DM, HTN, CVA, dsyphagia    PT Frequency 2x / week    PT Duration 6 weeks    PT Treatment/Interventions ADLs/Self Care Home Management;DME Instruction;Neuromuscular re-education;Balance training;Therapeutic exercise;Therapeutic activities;Functional mobility training;Stair training;Gait training;Patient/family education;Energy conservation;Manual techniques;Taping;Passive range of motion;Dry needling    PT Next Visit Plan continue to work on his awareness of posture and work on this, also look to see if we can get better control of the left knee with walking - possibly use ace wrap or TB wrapping technique to prevent hyperextension in stance during gait training. More stretching of right side and knee control    Consulted and Agree with Plan of Care Patient;Family member/caregiver    Family Member Consulted wife             Patient will benefit from skilled therapeutic intervention in order to improve the following deficits and impairments:  Abnormal gait, Decreased coordination, Difficulty walking, Decreased safety awareness, Decreased balance, Decreased strength, Decreased mobility  Visit Diagnosis: Muscle weakness (generalized)  Unsteadiness on feet  Ataxic gait  Abnormal posture  Other abnormalities of gait and mobility     Problem List Patient Active Problem List   Diagnosis Date Noted   Ankle edema, bilateral 02/16/2021   Left hemiparesis ([place]) 12/06/2020   Benign essential HTN    Cognitive deficit, post-stroke    Controlled type 2 diabetes mellitus with hyperglycemia, without long-term current  use of insulin ([place])    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 [place], [place], [address] Phone: [phone]   Fax:  [phone]  Name: HASEEB FIALLOS Sr. MRN: [id number] Date of Birth: 1951-03-04

## 2021-04-21 ENCOUNTER — Ambulatory Visit: Payer: Medicare Other | Admitting: Speech Pathology

## 2021-04-21 ENCOUNTER — Encounter: Payer: Self-pay | Admitting: Occupational Therapy

## 2021-04-21 ENCOUNTER — Other Ambulatory Visit: Payer: Self-pay

## 2021-04-21 ENCOUNTER — Encounter: Payer: Self-pay | Admitting: Speech Pathology

## 2021-04-21 ENCOUNTER — Ambulatory Visit: Payer: Medicare Other | Admitting: Occupational Therapy

## 2021-04-21 DIAGNOSIS — R278 Other lack of coordination: Secondary | ICD-10-CM | POA: Diagnosis not present

## 2021-04-21 DIAGNOSIS — R2689 Other abnormalities of gait and mobility: Secondary | ICD-10-CM

## 2021-04-21 DIAGNOSIS — I69319 Unspecified symptoms and signs involving cognitive functions following cerebral infarction: Secondary | ICD-10-CM

## 2021-04-21 DIAGNOSIS — R260 Ataxic gait: Secondary | ICD-10-CM | POA: Diagnosis not present

## 2021-04-21 DIAGNOSIS — R41841 Cognitive communication deficit: Secondary | ICD-10-CM

## 2021-04-21 DIAGNOSIS — R4701 Aphasia: Secondary | ICD-10-CM | POA: Diagnosis not present

## 2021-04-21 DIAGNOSIS — R2681 Unsteadiness on feet: Secondary | ICD-10-CM | POA: Diagnosis not present

## 2021-04-21 DIAGNOSIS — R41844 Frontal lobe and executive function deficit: Secondary | ICD-10-CM

## 2021-04-21 DIAGNOSIS — R41842 Visuospatial deficit: Secondary | ICD-10-CM

## 2021-04-21 DIAGNOSIS — R293 Abnormal posture: Secondary | ICD-10-CM | POA: Diagnosis not present

## 2021-04-21 DIAGNOSIS — M6281 Muscle weakness (generalized): Secondary | ICD-10-CM

## 2021-04-21 NOTE — Therapy (Signed)
[place]. [place], [place], [address] Phone: [phone]   Fax:  [phone]  Speech Language Pathology Treatment & Discharge Summary  Patient Details  Name: Joe AESCHLIMAN Sr. MRN: [id number] Date of Birth: 1951-01-16 Referring Provider (SLP): Elsie Stain, MD   Encounter Date: 04/21/2021   End of Session - 04/21/21 1513     Visit Number 34    Number of Visits --   Asking for 16 visits x2/week for 8 weeks.   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) 06/03/2019   Cerebrovascular accident (CVA) due to occlusion of vertebral artery ([name])    Diabetes mellitus without complication ([place])    Dysphagia    post stroke   Hypertension    Left upper lobe pneumonia 02/15/2021   Stroke [facility])     Past Surgical History:  Procedure Laterality Date   IR ANGIO INTRA EXTRACRAN SEL COM CAROTID INNOMINATE UNI L MOD SED  03/10/2019   IR ANGIO VERTEBRAL SEL SUBCLAVIAN INNOMINATE BILAT MOD SED  03/10/2019   IR CT HEAD LTD  03/10/2019   IR PERCUTANEOUS ART THROMBECTOMY/INFUSION INTRACRANIAL INC DIAG ANGIO  03/10/2019   RADIOLOGY WITH ANESTHESIA N/A 03/10/2019   Procedure: IR WITH ANESTHESIA/CODE STROKE;  Surgeon: Radiologist, Medication, MD;  Location: [facility];  Service: Radiology;  Laterality: N/A;    There were no vitals filed for this visit.   Subjective Assessment - 04/21/21 1505     Subjective SLP to d/c patient this session. Per OT report, pt is ready to be done with speech  therapy.    Currently in Pain? No/denies                   ADULT SLP TREATMENT - 04/21/21 0001       General Information   Behavior/Cognition Alert;Cooperative      Treatment Provided   Treatment provided Cognitive-Linquistic      Cognitive-Linquistic Treatment   Treatment focused on Aphasia;Cognition    Skilled Treatment Facilitated alternating attention through visual scanning and sequencing activities. Pt was required to match a 3-color pattern. He required moderate assistance verbal, gestural, visual cueing. SLP cont edu with patient and wife about d/cing from therapy due to limited motivation and progress made. Pt reports he is ready to be done.      Assessment / Recommendations / Plan   Plan Discharge SLP treatment due to (comment)   limited motivation and progress.     Progression Toward Goals   Progression toward goals Goals met, education completed, patient discharged from [facility] - 04/19/21 1540       [place] #1   Title Pt will name 6/10 objects modA over 3 sessions to increase expressive communication.    Time 2    Period Weeks  Status Not Met   Pt is able to make a choice verbally, but may not correctly identify.   Target Date 04/04/21      SLP SHORT TERM GOAL #2   Title Pt will follow through with swallowing saliva before speaking at home as reported by caregiver.    Period Weeks    Status Achieved      SLP SHORT TERM GOAL #3   Title Pt will demonstrate appropriate alternating attention between two tasks with <1 verbal/visual cue.    Time 2    Period Weeks    Status Not Met    Target Date 04/04/21      SLP SHORT TERM GOAL #4   Title Pt will solve basic problems in safety scenarios with minA.    Time 2    Period Weeks    Status Partially Met    Target Date 04/04/21      SLP SHORT TERM GOAL #5   Title Pt will match picture to words from a F02 to increase expressive and receptive language  with 80% acc.    Time 2    Period Weeks    Status Not Met    Target Date 04/21/21              SLP Long Term Goals - 04/21/21 1517       SLP LONG TERM GOAL #1   Title Client will utilize compensatory strategies to communicate wants and needs effectively to different conversational partners and participate socially in functional living environment.    Time 4    Period Weeks    Status Not Met      SLP LONG TERM GOAL #2   Title Client will demonstrate understanding of verbal communication related to daily activities and utilize compensatory strategies to more effectively communicate in their functional living environment.    Time 4    Period Weeks    Status Achieved      SLP LONG TERM GOAL #3   Title Patient will develop functional attention skills to effectively attend to and communicate in tasks of daily living in their functional living environment.    Time 4    Period Weeks    Status Partially Met   Pt fluctuates, but has functional attention skills             Plan - 04/21/21 1517     Clinical Impression Statement See tx note. SLP and pt in agreement that he would benefit from time off therapy due to lack of motivation. Limited progress has been made. Pt to be d/c from therapy this date.    Speech Therapy Frequency 2x / week    Duration 4 weeks    Treatment/Interventions Aspiration precaution training;Cueing hierarchy;Patient/family education;Functional tasks;Environmental controls;Cognitive reorganization;Multimodal communcation approach;Language facilitation;Compensatory techniques;Internal/external aids;SLP instruction and feedback;Compensatory strategies    Potential to Achieve Goals Fair    Potential Considerations Ability to learn/carryover information;Severity of impairments    Consulted and Agree with Plan of Care Patient;Family member/caregiver             Patient will benefit from skilled therapeutic intervention in order to improve the following  deficits and impairments:   Aphasia  Cognitive communication deficit    Problem List Patient Active Problem List   Diagnosis Date Noted   Ankle edema, bilateral 02/16/2021   Left hemiparesis ([place]) 12/06/2020   Benign essential HTN    Cognitive deficit, post-stroke    Controlled type 2 diabetes mellitus with hyperglycemia, without  long-term current use of insulin ([name])    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 Most impairment noted in attention and awareness of deficits.    Remaining deficits: Attention, verbal and written expression, auditory comprehension (seems to be mostly related to cognitive-communication impairment vs. Language impairment), decreased processing.    Education / Equipment: Edu completed and provided to wife.    Patient agrees to discharge. Patient goals were partially met. Patient is being discharged due to  lack of progress and motivation to continue.[name], CCC-SLP, [name]  04/21/2021, 3:20 PM  [place]. [place], [place], [address] Phone: [phone]   Fax:  [phone]   Name: DAIVION PAPE Sr. MRN: [id number] Date of Birth: 05-26-1951

## 2021-04-22 NOTE — Therapy (Signed)
[place]. [place], [place], [address] Phone: [phone]   Fax:  [phone]  Occupational Therapy Treatment  Patient Details  Name: Joe Perry Sr. MRN: [id number] Date of Birth: [birth year] Referring Provider (OT): Asencion Noble, MD   Encounter Date: 04/21/2021   OT End of Session - 04/21/21 1413     Visit Number 8    Number of Visits 13    Date for OT Re-Evaluation 05/11/21    Authorization Type [employer]HC Medicare; BCBS state (secondary)    Authorization Time Period VL: [place]    Progress Note Due on Visit 10    OT Start Time 1402    OT Stop Time 1445    OT Time Calculation (min) 43 min    Activity Tolerance Patient tolerated treatment well    Behavior During Therapy Flat affect            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 ([place]) 06/03/2019   Cerebrovascular accident (CVA) due to occlusion of vertebral artery ([name])    Diabetes mellitus without complication ([place])    Dysphagia    post stroke   Hypertension    Left upper lobe pneumonia 02/15/2021   Stroke [facility])     Past Surgical History:  Procedure Laterality Date   IR ANGIO INTRA EXTRACRAN SEL COM CAROTID INNOMINATE UNI L MOD SED  03/10/2019   IR ANGIO VERTEBRAL SEL SUBCLAVIAN INNOMINATE BILAT MOD SED  03/10/2019   IR CT HEAD LTD  03/10/2019   IR PERCUTANEOUS ART THROMBECTOMY/INFUSION INTRACRANIAL INC DIAG ANGIO  03/10/2019   RADIOLOGY WITH ANESTHESIA N/A 03/10/2019   Procedure: IR WITH ANESTHESIA/CODE STROKE;  Surgeon: Radiologist, Medication, MD;  Location: [facility];  Service: Radiology;  Laterality: N/A;    There were no vitals filed for this visit.   Subjective Assessment - 04/21/21 1413     Subjective  Pt reports he is happy to be almost done w/ SLP    Patient is accompanied by: Family member   Wife Joe Perry)   Pertinent  History Hx of CVA (2017, March 2020, July 2020); T2DM; HTN    Limitations Fall risk; L homonymous hemianopia (per chart review); dysphagia; cognitive-communication deficit    Patient Stated Goals Get stronger; improve walking; improve independence    Currently in Pain? No/denies             Treatment/Exercises - 04/21/21    Weight Shifting: Seated Pt completed weight shifting to R side to retrieve therapy cones from floor level using contralateral LUE and then stack cones at elevated tabletop on ipsilateral, L side. Completed 1 set on each side to facilitate LUE GMC as well as weight bearing in LUE for facilitation of proprioceptive input and kinesthetic sense; required initial verbal cues and demonstration for trunk rotation and flexion to avoid scooting closer to EOM for reach     [place] Pt retrieving balls from L side w/ LUE and placing on therapy cones positioned at standard tabletop height for [place] and eye-hand coordination/targeting; demo'd decreased coordination/multiple drops  Picking up 1" blocks w/ power grip exerciser using L hand and placing in a container to facilitate [place], functional grasp/release, visual perception, and hand strengthening; pt completed 20 blocks w/ many drops; no add'l cueing required for attending to L side     Visual Perception Tabletop number cancellation activity w/ pt identifying 11/13  targets w/ Mod I (extended time); OT incorporated high contract visual aid to facilitate consistent scanning to L side of page w/ positive results. Pt required min verbal cues to fully scan to L side toward end of task     Cognition 1 step, 2-component written/oral instruction w/ pt demo'ing significant difficulty; OT utilized repetition, visual//gestural cues w/ poor results         OT Short Term Goals - 04/21/21 1723       OT SHORT TERM GOAL #1   Title Pt will be able to sequence through one BADL task (e.g., donning pants, washing UB) w/ SPV in at least 2 trials     Baseline Min A w/ BADL tasks    Time 6    Period Weeks    Status Achieved   04/12/21 - LB dressing   Target Date 04/01/21      OT SHORT TERM GOAL #2   Title Pt will improve participation in functional FM tasks as evidenced by completing 9-HPT w/ L hand in less than 3 minutes    Baseline 9-HPT w/ R hand (1 min, 14 sec); L hand (>3 min)    Time 6    Period Weeks    Status Achieved   04/05/21 - 1 min, 46 sec w/ LUE/1 drop     OT SHORT TERM GOAL #3   Title Pt will perform simple visual scanning at table with at least 90% accuracy.    Baseline Visual-perceptual deficits    Time 6    Period Weeks    Status On-going   04/22/21 - 85% accuracy; 03/29/21 - 67% accuracy     OT SHORT TERM GOAL #4   Title Pt will improve GM coordination during BADLs as evidenced by increasing Box and Blocks test by at least 4 blocks    Baseline Box and Blocks (30 seconds) w/ R hand (9); L hand (4)    Time 6    Period Weeks    Status On-going   03/29/21 - RUE 12; LUE 6            OT Long Term Goals - 03/21/21 1134       OT LONG TERM GOAL #1   Title Pt will be able to participate in HEP for strength, coordination, and visual-perception w/ SPV    Baseline No HEP at this time    Time 12    Period Weeks    Status On-going      OT LONG TERM GOAL #2   Title Pt will improve participation in functional FM tasks as evidenced by decreasing 9-HPT time w/ R, dominant hand by at least 14 seconds    Baseline 9-HPT w/ R hand (1 min, 14 sec); L hand (>3 min)    Time 12    Period Weeks    Status On-going      OT LONG TERM GOAL #3   Title Pt will be able to complete simple IADL task (e.g., washing plastic dish, folding towel) w/ SPV for safety in at least 2 trials    Baseline Very minimal participation in IADLs at this time    Time 12    Period Weeks    Status On-going      OT LONG TERM GOAL #4   Title Pt will demonstrate full BADL activity (dressing, bathing, toileting) w/ SPV for safety    Baseline Completing  BADLs w/ Min A, per spouse report    Time 12  Period Weeks    Status On-going             Plan - 04/21/21 1725     Clinical Impression Statement Pt continues to progress slowly toward goals and demo'd improvement in visual scanning/perception this session. OT continued to address postural stability/strength, L-sided awareness, and LUE control and coordination. Pt was consistently unable to successfully complete 2-component, 1-step instructions, indicating decreased processing/receptive understanding, but required less cueing for sequencing of activities compared w/ previous sessions, which indicates some carryover of learned strategies.    OT Occupational Profile and History Detailed Assessment- Review of Records and additional review of physical, cognitive, psychosocial history related to current functional performance    Occupational performance deficits (Please refer to evaluation for details): ADL's;IADL's;Work;Leisure;Social Participation    Body Structure / Function / Physical Skills ADL;ROM;UE functional use;Balance;Decreased knowledge of use of DME;FMC;Mobility;Vestibular;Body mechanics;Dexterity;Gait;Sensation;Vision;GMC;Strength;Proprioception;IADL;Coordination    Cognitive Skills Attention;Problem Solve;Safety Awareness;Sequencing    Rehab Potential Good    Clinical Decision Making Several treatment options, min-mod task modification necessary    Comorbidities Affecting Occupational Performance: May have comorbidities impacting occupational performance    Modification or Assistance to Complete Evaluation  Min-Moderate modification of tasks or assist with assess necessary to complete eval    OT Frequency 1x / week    OT Duration 12 weeks    OT Treatment/Interventions Self-care/ADL training;Therapeutic exercise;Visual/perceptual remediation/compensation;Aquatic Therapy;Moist Heat;Cryotherapy;Neuromuscular education;Patient/family education;Fluidtherapy;Energy conservation;Advertising account executive;Therapeutic activities;Balance training;DME and/or AE instruction;Manual Therapy;Passive range of motion;Cognitive remediation/compensation    Plan Re-assess Box and Blocks and visual sanning; continue w/ large amplitude movements and LUE wt-bearing/shifting for increased L-sided awareness    Consulted and Agree with Plan of Care Patient;Family member/caregiver    Family Member Consulted Wife Transport planner)             Patient will benefit from skilled therapeutic intervention in order to improve the following deficits and impairments:   Body Structure / Function / Physical Skills: ADL, ROM, UE functional use, Balance, Decreased knowledge of use of DME, FMC, Mobility, Vestibular, Body mechanics, Dexterity, Gait, Sensation, Vision, GMC, Strength, Proprioception, IADL, Coordination Cognitive Skills: Attention, Problem Solve, Safety Awareness, Sequencing   Visit Diagnosis: Other lack of coordination  Muscle weakness (generalized)  Other abnormalities of gait and mobility  Unspecified symptoms and signs involving cognitive functions following cerebral infarction  Visuospatial deficit  Frontal lobe and executive function deficit    Problem List Patient Active Problem List   Diagnosis Date Noted   Ankle edema, bilateral 02/16/2021   Left hemiparesis ([place]) 12/06/2020   Benign essential HTN    Cognitive deficit, post-stroke    Controlled type 2 diabetes mellitus with hyperglycemia, without long-term current use of insulin ([name])    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 [place], [place], [address] Phone: [phone]   Fax:  [phone]  Name: REDMOND CLARE Sr. MRN: [id number] Date of Birth: 1951/09/04

## 2021-04-27 ENCOUNTER — Encounter: Payer: Medicare Other | Admitting: Physical Therapy

## 2021-04-27 ENCOUNTER — Encounter: Payer: Medicare Other | Admitting: Occupational Therapy

## 2021-04-28 ENCOUNTER — Encounter: Payer: Self-pay | Admitting: Physical Therapy

## 2021-04-28 ENCOUNTER — Encounter: Payer: Self-pay | Admitting: Occupational Therapy

## 2021-04-28 ENCOUNTER — Other Ambulatory Visit: Payer: Self-pay

## 2021-04-28 ENCOUNTER — Ambulatory Visit: Payer: Medicare Other | Admitting: Occupational Therapy

## 2021-04-28 ENCOUNTER — Ambulatory Visit: Payer: Medicare Other | Admitting: Physical Therapy

## 2021-04-28 DIAGNOSIS — R2689 Other abnormalities of gait and mobility: Secondary | ICD-10-CM

## 2021-04-28 DIAGNOSIS — R293 Abnormal posture: Secondary | ICD-10-CM | POA: Diagnosis not present

## 2021-04-28 DIAGNOSIS — R2681 Unsteadiness on feet: Secondary | ICD-10-CM

## 2021-04-28 DIAGNOSIS — R41842 Visuospatial deficit: Secondary | ICD-10-CM | POA: Diagnosis not present

## 2021-04-28 DIAGNOSIS — M6281 Muscle weakness (generalized): Secondary | ICD-10-CM | POA: Diagnosis not present

## 2021-04-28 DIAGNOSIS — I69319 Unspecified symptoms and signs involving cognitive functions following cerebral infarction: Secondary | ICD-10-CM

## 2021-04-28 DIAGNOSIS — R278 Other lack of coordination: Secondary | ICD-10-CM

## 2021-04-28 DIAGNOSIS — R41844 Frontal lobe and executive function deficit: Secondary | ICD-10-CM

## 2021-04-28 DIAGNOSIS — R260 Ataxic gait: Secondary | ICD-10-CM

## 2021-04-28 DIAGNOSIS — R4701 Aphasia: Secondary | ICD-10-CM | POA: Diagnosis not present

## 2021-04-28 NOTE — Therapy (Signed)
[facility]. [place], [place], [address] Phone: [phone]   Fax:  [phone]  Occupational Therapy Treatment  Patient Details  Name: Joe Perry. MRN: [id number] Date of Birth: [birth year] Referring Provider (OT): Asencion Noble, MD   Encounter Date: 04/28/2021   OT End of Session - 04/28/21 1405     Visit Number 9    Number of Visits 13    Date for OT Re-Evaluation 05/11/21    Authorization Type UHC Medicare; BCBS state (secondary)    Authorization Time Period VL: [place]    Progress Note Due on Visit 10    OT Start Time 1400    OT Stop Time 1442    OT Time Calculation (min) 42 min    Activity Tolerance Patient tolerated treatment well    Behavior During Therapy Flat affect            Past Medical History:  Diagnosis Date   Acute metabolic encephalopathy 12/12/7351   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 ([place]) 06/03/2019   Cerebrovascular accident (CVA) due to occlusion of vertebral artery ([name])    Diabetes mellitus without complication ([place])    Dysphagia    post stroke   Hypertension    Left upper lobe pneumonia 02/15/2021   Stroke [facility])     Past Surgical History:  Procedure Laterality Date   IR ANGIO INTRA EXTRACRAN SEL COM CAROTID INNOMINATE UNI L MOD SED  03/10/2019   IR ANGIO VERTEBRAL SEL SUBCLAVIAN INNOMINATE BILAT MOD SED  03/10/2019   IR CT HEAD LTD  03/10/2019   IR PERCUTANEOUS ART THROMBECTOMY/INFUSION INTRACRANIAL INC DIAG ANGIO  03/10/2019   RADIOLOGY WITH ANESTHESIA N/A 03/10/2019   Procedure: IR WITH ANESTHESIA/CODE STROKE;  Surgeon: Radiologist, Medication, MD;  Location: [facility];  Service: Radiology;  Laterality: N/A;    There were no vitals filed for this visit.   Subjective Assessment - 04/28/21 1404     Subjective  Pt reports he is feeling "fine" today    Patient is accompanied by: Family member   Wife Joe Perry)   Pertinent History Hx  of CVA (2017, March 2020, July 2020); T2DM; HTN    Limitations Fall risk; L homonymous hemianopia (per chart review); dysphagia; cognitive-communication deficit    Patient Stated Goals Get stronger; improve walking; improve independence    Currently in Pain? No/denies             Treatment/Exercises - 04/28/21    Therapeutic Activities     Box and Blocks Box and Blocks used to facilitate unilateral GMC, eye-hand coordination, and coordination; timer used for initial 30 sec to reassess progress toward STG4. Pt demo'd decreased coordination w/ LUE. OT graded task down by isolating 10 blocks on a towel for pt to retrieve due to decreased search for available blocks appearing to decrease speed w/ positive results; pt able to complete 9 blocks in 30 sec using LUE    Visual Scanning Tabletop symbol search activity w/ pt circling 9/10 icons; OT provided 2 verbal cues for pt to continue task and incorporated high contrast visual cue on L side of page for increased attention to L side  Attempted simple tabletop word search to facilitate visual scanning and eye-hand coordination/targeting; pt unable to complete activity successfully despite max cues, modeling, and breakdown of task, attempting to circle individual letters and unable to find 3/3 words attempted. Activity was d/c.   IADLs  Dish Washing Hand washing small bowl for sequencing during functional tasks, bilateral integration, posture, standing tolerance, and attention to task. Pt was able to turn on faucet, retrieve washcloth, get dish soap, and clean dish, but experienced difficulty rinsing dish, maintaining upright posture (required mod verbal and tactile cues), and turning off faucet (requiring mod verbal cues and extended time for processing). OT also had pt throw away paper towel to posterior, L side to improve attention to L side w/ pt able to complete w/ no add'l cues         OT Short Term Goals - 04/28/21 1409       OT SHORT  TERM GOAL #1   Title Pt will be able to sequence through one BADL task (e.g., donning pants, washing UB) w/ SPV in at least 2 trials    Baseline Min A w/ BADL tasks    Time 6    Period Weeks    Status Achieved   04/12/21 - LB dressing   Target Date 04/01/21      OT SHORT TERM GOAL #2   Title Pt will improve participation in functional FM tasks as evidenced by completing 9-HPT w/ L hand in less than 3 minutes    Baseline 9-HPT w/ R hand (1 min, 14 sec); L hand (>3 min)    Time 6    Period Weeks    Status Achieved   04/05/21 - 1 min, 46 sec w/ LUE/1 drop     OT SHORT TERM GOAL #3   Title Pt will perform simple visual scanning at table with at least 90% accuracy.    Baseline Visual-perceptual deficits    Time 6    Period Weeks    Status Achieved   04/28/21 - 90%; 04/22/21 - 85%; 03/29/21 - 67% accuracy     OT SHORT TERM GOAL #4   Title Pt will improve GM coordination during BADLs as evidenced by increasing Box and Blocks test by at least 4 blocks    Baseline Box and Blocks (30 seconds) w/ R hand (9); L hand (4)    Time 6    Period Weeks    Status Partially Met   04/28/21 - RUE 13 / LUE 7; 03/29/21 - RUE 12 / LUE 6            OT Long Term Goals - 03/21/21 1134       OT LONG TERM GOAL #1   Title Pt will be able to participate in HEP for strength, coordination, and visual-perception w/ SPV    Baseline No HEP at this time    Time 12    Period Weeks    Status On-going      OT LONG TERM GOAL #2   Title Pt will improve participation in functional FM tasks as evidenced by decreasing 9-HPT time w/ R, dominant hand by at least 14 seconds    Baseline 9-HPT w/ R hand (1 min, 14 sec); L hand (>3 min)    Time 12    Period Weeks    Status On-going      OT LONG TERM GOAL #3   Title Pt will be able to complete simple IADL task (e.g., washing plastic dish, folding towel) w/ SPV for safety in at least 2 trials    Baseline Very minimal participation in IADLs at this time    Time 12    Period  Weeks    Status On-going      OT LONG  TERM GOAL #4   Title Pt will demonstrate full BADL activity (dressing, bathing, toileting) w/ SPV for safety    Baseline Completing BADLs w/ Min A, per spouse report    Time 12    Period Weeks    Status On-going             Plan - 04/28/21 1520     Clinical Impression Statement Difficulties observed during session (not turning faucet off, circling letters during word search, etc.) indicate potential difficulty w/ task switching/transitioning as well as termination; required verbal cues and repetition to terminate tasks. Pt did demo adequate initiation and sequencing during IADL task, and will continue to benefit from increased participation in functional activities/carryover to home.    OT Occupational Profile and History Detailed Assessment- Review of Records and additional review of physical, cognitive, psychosocial history related to current functional performance    Occupational performance deficits (Please refer to evaluation for details): ADL's;IADL's;Work;Leisure;Social Participation    Body Structure / Function / Physical Skills ADL;ROM;UE functional use;Balance;Decreased knowledge of use of DME;FMC;Mobility;Vestibular;Body mechanics;Dexterity;Gait;Sensation;Vision;GMC;Strength;Proprioception;IADL;Coordination    Cognitive Skills Attention;Problem Solve;Safety Awareness;Sequencing    Rehab Potential Good    Clinical Decision Making Several treatment options, min-mod task modification necessary    Comorbidities Affecting Occupational Performance: May have comorbidities impacting occupational performance    Modification or Assistance to Complete Evaluation  Min-Moderate modification of tasks or assist with assess necessary to complete eval    OT Frequency 1x / week    OT Duration 12 weeks    OT Treatment/Interventions Self-care/ADL training;Therapeutic exercise;Visual/perceptual remediation/compensation;Aquatic Therapy;Moist  Heat;Cryotherapy;Neuromuscular education;Patient/family education;Fluidtherapy;Energy conservation;Therapist, nutritional;Therapeutic activities;Balance training;DME and/or AE instruction;Manual Therapy;Passive range of motion;Cognitive remediation/compensation    Plan Implement HEP for L-sided awareness, coordination, ROM, and visual perception); continue w/ large amplitude movements and LUE wt-bearing/shifting for increased L-sided awareness    Consulted and Agree with Plan of Care Patient;Family member/caregiver    Family Member Consulted Wife Transport planner)             Patient will benefit from skilled therapeutic intervention in order to improve the following deficits and impairments:   Body Structure / Function / Physical Skills: ADL, ROM, UE functional use, Balance, Decreased knowledge of use of DME, FMC, Mobility, Vestibular, Body mechanics, Dexterity, Gait, Sensation, Vision, GMC, Strength, Proprioception, IADL, Coordination Cognitive Skills: Attention, Problem Solve, Safety Awareness, Sequencing   Visit Diagnosis: Other lack of coordination  Unspecified symptoms and signs involving cognitive functions following cerebral infarction  Frontal lobe and executive function deficit  Visuospatial deficit  Other abnormalities of gait and mobility  Muscle weakness (generalized)    Problem List Patient Active Problem List   Diagnosis Date Noted   Ankle edema, bilateral 02/16/2021   Left hemiparesis ([place]) 12/06/2020   Benign essential HTN    Cognitive deficit, post-stroke    Controlled type 2 diabetes mellitus with hyperglycemia, without long-term current use of insulin ([name])    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 [place], [place], [address] Phone: [phone]   Fax:  [phone]  Name: Harvie  B Tyner Perry. MRN: [id number] Date of Birth: 26-Aug-1951

## 2021-04-28 NOTE — Therapy (Signed)
[facility]. [place], [place], [address] Phone: [phone]   Fax:  [phone]  Physical Therapy Treatment  Patient Details  Name: Joe VICTORY Sr. MRN: [id number] Date of Birth: [birth year] Referring Provider (PT): Asencion Noble, MD   Encounter Date: 04/28/2021   PT End of Session - 04/28/21 1523     Visit Number 27    Date for PT Re-Evaluation 05/31/21    Authorization Type UHC Medicare    PT Start Time 9622    PT Stop Time 1525    PT Time Calculation (min) 41 min    Activity Tolerance Patient tolerated treatment well    Behavior During Therapy Flat affect             Past Medical History:  Diagnosis Date   Acute metabolic encephalopathy 2/97/9892   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 ([place]) 06/03/2019   Cerebrovascular accident (CVA) due to occlusion of vertebral artery ([name])    Diabetes mellitus without complication ([place])    Dysphagia    post stroke   Hypertension    Left upper lobe pneumonia 02/15/2021   Stroke [facility])     Past Surgical History:  Procedure Laterality Date   IR ANGIO INTRA EXTRACRAN SEL COM CAROTID INNOMINATE UNI L MOD SED  03/10/2019   IR ANGIO VERTEBRAL SEL SUBCLAVIAN INNOMINATE BILAT MOD SED  03/10/2019   IR CT HEAD LTD  03/10/2019   IR PERCUTANEOUS ART THROMBECTOMY/INFUSION INTRACRANIAL INC DIAG ANGIO  03/10/2019   RADIOLOGY WITH ANESTHESIA N/A 03/10/2019   Procedure: IR WITH ANESTHESIA/CODE STROKE;  Surgeon: Radiologist, Medication, MD;  Location: [facility];  Service: Radiology;  Laterality: N/A;    There were no vitals filed for this visit.   Subjective Assessment - 04/28/21 1447     Subjective Wife reports no falls    Currently in Pain? No/denies                               [facility] Adult PT Treatment/Exercise - 04/28/21 0001       Transfers   Comments worked on him kneeling and getting up. used two  chairs and he [place] on two airex pads under knees min A needed a lot of cues      Ambulation/Gait   Gait Comments gait with HHA a lot of cues for posture and to look ahead, stairs step over step up and down      High Level Balance   High Level Balance Comments seated ball toss, standing cone toe touches using the walkier, standing backs of knees touching chair ball toss using walker      Knee/Hip Exercises: Aerobic   Nustep level 5 x 6 minutes UE and LE      Knee/Hip Exercises: Machines for Strengthening   Cybex Knee Extension 5# 2x10    Cybex Knee Flexion 20# 2x10      Knee/Hip Exercises: Standing   Hip Abduction Both;20 reps    Hip Extension Both;20 reps      Knee/Hip Exercises: Seated   Other Seated Knee/Hip Exercises sit to stand from chair with airex pad to raise him up a little, hands on thighs for some help                      PT Short Term Goals - 04/19/21 1704  PT SHORT TERM GOAL #1   Title Pt will perform HEP with wife's assistance, for improved strength, balance, gait.    Time 3    Period Weeks    Status On-going   wife reports difficulty with getting Joe Perry to do exercises at home- suggested caregiver/CNA for support at home and adult daycare services for improved participation and caregiver respite   Target Date 05/10/21               PT Long Term Goals - 04/19/21 1706       PT LONG TERM GOAL #1   Title Pt will perform progressive HEP with family supervision and assistance for improved balance, strength, gait.    Time 6    Status On-going   trouble with participation at home   Target Date 05/31/21      PT LONG TERM GOAL #2   Title Pt will improve 5TSTS score to </= 20 seconds to demonstrate improved functional strength and decreased fall risk    Baseline Initial 54". 12/23/20: 35 seconds with arms of chair BUE support for push up. 02/08/21: 28Sec with BUE assist on mat to push up. 03/23/21: 33.22 sec with BUE assist on arms of chair.  04/19/21: 35.54 sec with B UE support    Time 6    Status On-going    Target Date 05/31/21      PT LONG TERM GOAL #3   Title Pt will improve TUG score to less than or equal to 25 seconds for decreased fall risk.    Baseline Initial 59 sec. 12/23/20: 55 seconds. 02/08/21: 44 seconds - most difficulty and time with turns. 03/23/21: 76 sec most difficulty and time with turns. 04/19/21: 60.5 sec with RW    Time 6    Status Partially Met    Target Date 05/31/21                   Plan - 04/28/21 1524     Clinical Impression Statement I worked a little more on strength today and then some transfers, tried to have him get up from kneeling.  He follows cues very well and was able to do.  He denied pain with any of the exercises.  he tends to drag the left foot.  With HHA need to be careful becasue the left foot causes a near stumble    PT Next Visit Plan continue to work on his awareness of posture and work on this, also look to see if we can get better control of the left knee with walking - possibly use ace wrap or TB wrapping technique to prevent hyperextension in stance during gait training. More stretching of right side and knee control, ability to get up from floor    Consulted and Agree with Plan of Care Patient;Family member/caregiver    Family Member Consulted wife             Patient will benefit from skilled therapeutic intervention in order to improve the following deficits and impairments:  Abnormal gait, Decreased coordination, Difficulty walking, Decreased safety awareness, Decreased balance, Decreased strength, Decreased mobility  Visit Diagnosis: Other lack of coordination  Muscle weakness (generalized)  Other abnormalities of gait and mobility  Unsteadiness on feet  Ataxic gait     Problem List Patient Active Problem List   Diagnosis Date Noted   Ankle edema, bilateral 02/16/2021   Left hemiparesis ([place]) 12/06/2020   Benign essential HTN    Cognitive  deficit,  post-stroke    Controlled type 2 diabetes mellitus with hyperglycemia, without long-term current use of insulin ([name])    Vestibular schwannoma ([place]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 [place], [place], [address] Phone: [phone]   Fax:  [phone]  Name: TOU HAYNER Sr. MRN: [id number] Date of Birth: 09-08-1950

## 2021-05-04 ENCOUNTER — Encounter: Payer: Medicare Other | Admitting: Occupational Therapy

## 2021-05-04 ENCOUNTER — Encounter: Payer: Medicare Other | Admitting: Physical Therapy

## 2021-05-05 ENCOUNTER — Other Ambulatory Visit: Payer: Self-pay

## 2021-05-05 ENCOUNTER — Ambulatory Visit: Payer: Medicare Other | Admitting: Physical Therapy

## 2021-05-05 ENCOUNTER — Encounter: Payer: Self-pay | Admitting: Physical Therapy

## 2021-05-05 ENCOUNTER — Ambulatory Visit: Payer: Medicare Other | Attending: Critical Care Medicine | Admitting: Occupational Therapy

## 2021-05-05 DIAGNOSIS — R41844 Frontal lobe and executive function deficit: Secondary | ICD-10-CM | POA: Insufficient documentation

## 2021-05-05 DIAGNOSIS — R2681 Unsteadiness on feet: Secondary | ICD-10-CM | POA: Insufficient documentation

## 2021-05-05 DIAGNOSIS — R278 Other lack of coordination: Secondary | ICD-10-CM | POA: Diagnosis not present

## 2021-05-05 DIAGNOSIS — M6281 Muscle weakness (generalized): Secondary | ICD-10-CM | POA: Diagnosis not present

## 2021-05-05 DIAGNOSIS — R41842 Visuospatial deficit: Secondary | ICD-10-CM | POA: Diagnosis present

## 2021-05-05 DIAGNOSIS — R260 Ataxic gait: Secondary | ICD-10-CM

## 2021-05-05 DIAGNOSIS — R2689 Other abnormalities of gait and mobility: Secondary | ICD-10-CM | POA: Diagnosis not present

## 2021-05-05 DIAGNOSIS — I69319 Unspecified symptoms and signs involving cognitive functions following cerebral infarction: Secondary | ICD-10-CM | POA: Insufficient documentation

## 2021-05-05 NOTE — Therapy (Signed)
[facility]. [place], [place], [address] Phone: [phone]   Fax:  [phone]  Physical Therapy Treatment  Patient Details  Name: Joe DEVONSHIRE Sr. MRN: [id number] Date of Birth: [birth year] Referring Provider (PT): Asencion Noble, MD   Encounter Date: 05/05/2021   PT End of Session - 05/05/21 1524     Visit Number 28    Date for PT Re-Evaluation 05/31/21    Authorization Type UHC Medicare    PT Start Time 5051    PT Stop Time 1526    PT Time Calculation (min) 42 min    Activity Tolerance Patient tolerated treatment well    Behavior During Therapy Flat affect             Past Medical History:  Diagnosis Date   Acute metabolic encephalopathy 8/33/5825   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 ([place]) 06/03/2019   Cerebrovascular accident (CVA) due to occlusion of vertebral artery ([name])    Diabetes mellitus without complication ([place])    Dysphagia    post stroke   Hypertension    Left upper lobe pneumonia 02/15/2021   Stroke [facility])     Past Surgical History:  Procedure Laterality Date   IR ANGIO INTRA EXTRACRAN SEL COM CAROTID INNOMINATE UNI L MOD SED  03/10/2019   IR ANGIO VERTEBRAL SEL SUBCLAVIAN INNOMINATE BILAT MOD SED  03/10/2019   IR CT HEAD LTD  03/10/2019   IR PERCUTANEOUS ART THROMBECTOMY/INFUSION INTRACRANIAL INC DIAG ANGIO  03/10/2019   RADIOLOGY WITH ANESTHESIA N/A 03/10/2019   Procedure: IR WITH ANESTHESIA/CODE STROKE;  Surgeon: Radiologist, Medication, MD;  Location: [place];  Service: Radiology;  Laterality: N/A;    There were no vitals filed for this visit.   Subjective Assessment - 05/05/21 1444     Subjective Wife reports to me that Joe Perry has been doing well, no more falls    Currently in Pain? No/denies                               [facility] Adult PT Treatment/Exercise - 05/05/21 0001       Ambulation/Gait   Gait  Comments walking with two HHA and with a single HHA, x110 feet each, some cues for posture, step lengtha, to pick up left foot and at times to slow down, stairs up and down step over step      High Level Balance   High Level Balance Comments seated ball toss, standing ball toss and standing ball kick, close CGA with the ball toss, min/mod A for the ball kicks, could only kick with the right foot, seated ball kicks      Knee/Hip Exercises: Aerobic   Nustep level 6 x 6 minutes UE and LE      Knee/Hip Exercises: Machines for Strengthening   Cybex Knee Extension 10# 2x10, 5# left only 2x5 a lot of cues for the slow eccentric phase    Cybex Knee Flexion 20# 2x10                      PT Short Term Goals - 04/19/21 1704       PT SHORT TERM GOAL #1   Title Pt will perform HEP with wife's assistance, for improved strength, balance, gait.    Time 3    Period Weeks    Status On-going   wife reports  difficulty with getting Joe Perry to do exercises at home- suggested caregiver/CNA for support at home and adult daycare services for improved participation and caregiver respite   Target Date 05/10/21               PT Long Term Goals - 05/05/21 1528       PT LONG TERM GOAL #1   Title Pt will perform progressive HEP with family supervision and assistance for improved balance, strength, gait.    Status Partially Met                   Plan - 05/05/21 1525     Clinical Impression Statement Continued with the strength and the balance, I am doing less and less HHA trying to get him better balance and more functional gait, my biggest issue with this is safety and have told him to not do this at home.  With the ball toss standing I really did not have to hold him but he did want to reach out and hold onto the wall    PT Next Visit Plan continue to work on his awareness of posture and work on this, also look to see if we can get better control of the left knee with walking -  possibly use ace wrap or TB wrapping technique to prevent hyperextension in stance during gait training. More stretching of right side and knee control, ability to get up from floor    Consulted and Agree with Plan of Care Patient;Family member/caregiver    Family Member Consulted wife             Patient will benefit from skilled therapeutic intervention in order to improve the following deficits and impairments:  Abnormal gait, Decreased coordination, Difficulty walking, Decreased safety awareness, Decreased balance, Decreased strength, Decreased mobility  Visit Diagnosis: Other lack of coordination  Muscle weakness (generalized)  Other abnormalities of gait and mobility  Unsteadiness on feet  Ataxic gait     Problem List Patient Active Problem List   Diagnosis Date Noted   Ankle edema, bilateral 02/16/2021   Left hemiparesis ([place]) 12/06/2020   Benign essential HTN    Cognitive deficit, post-stroke    Controlled type 2 diabetes mellitus with hyperglycemia, without long-term current use of insulin ([name])    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 [place], [place], [address] Phone: [phone]   Fax:  [phone]  Name: Joe COZZOLINO Sr. MRN: [id number] Date of Birth: [birth year]

## 2021-05-05 NOTE — Therapy (Signed)
[facility] [place], [place], [address] Phone: [phone]   Fax:  [phone]  Occupational Therapy Treatment & Progress Note/Re-Certification  Patient Details  Name: Joe WEDIG Sr. MRN: [id number] Date of Birth: July 17, 1951 Referring Provider (OT): Asencion Noble, MD   Encounter Date: 05/05/2021   OT End of Session - 05/05/21 1418     Visit Number 10    Number of Visits 13    Date for OT Re-Evaluation 06/10/21    Authorization Type UHC Medicare; BCBS state (secondary)    Authorization Time Period VL: [place]    Progress Note Due on Visit 10    OT Start Time 1413   pt arrival time   OT Stop Time 1445    OT Time Calculation (min) 32 min    Activity Tolerance Patient tolerated treatment well    Behavior During Therapy Flat affect            Occupational Therapy Progress Note  Dates of Reporting Period: 02/10/21 to 91/22  Objective Reports of Subjective Statement: Pt continues to report no pain  Objective Measurements: Levels of independence, 9-HPT, Box and Blocks Test  Goal Update: Pt has met 3/4 STGs, partially met remaining STG, and is progressing toward LTGs   Plan: Continue to address independence w/ BADLs and simple IADL tasks, functional balance and mobility, coordination, L-sided inattention, and implementation of pt-specific HEP  Reason Skilled Services are Required: Pt continues to experience deficits w/ cognition/executive functioning, control and coordination, balance and functional mobility, and participation in ADLs   Past Medical History:  Diagnosis Date   Acute metabolic encephalopathy 4/68/0321   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 ([place]) 06/03/2019   Cerebrovascular accident (CVA) due to occlusion of vertebral artery ([place])    Diabetes mellitus without complication ([place])    Dysphagia    post stroke   Hypertension    Left upper lobe  pneumonia 02/15/2021   Stroke [facility])     Past Surgical History:  Procedure Laterality Date   IR ANGIO INTRA EXTRACRAN SEL COM CAROTID INNOMINATE UNI L MOD SED  03/10/2019   IR ANGIO VERTEBRAL SEL SUBCLAVIAN INNOMINATE BILAT MOD SED  03/10/2019   IR CT HEAD LTD  03/10/2019   IR PERCUTANEOUS ART THROMBECTOMY/INFUSION INTRACRANIAL INC DIAG ANGIO  03/10/2019   RADIOLOGY WITH ANESTHESIA N/A 03/10/2019   Procedure: IR WITH ANESTHESIA/CODE STROKE;  Surgeon: Radiologist, Medication, MD;  Location: [facility];  Service: Radiology;  Laterality: N/A;    There were no vitals filed for this visit.   Subjective Assessment - 05/05/21 1417     Subjective  "Do you want me to undo them?"    Patient is accompanied by: Family member   Wife [name]   Pertinent History Hx of CVA (2017, March 2020, July 2020); T2DM; HTN    Limitations Fall risk; L homonymous hemianopia (per chart review); dysphagia; cognitive-communication deficit    Patient Stated Goals Get stronger; improve walking; improve independence    Currently in Pain? No/denies             Treatment/Exercises - 05/05/21    Closed-Chain BUE AROM  Overhead press and forward chest press w/ unweighted dowel to facilitate bilateral integration, L-sided awareness/kinesthetic sense, and increased functional overhead reach; completed 2x10 of each exercise w/ OT providing verbal cues and modeling for full shoulder flexion/elbow extension each rep    ADLs: Dressing Simulated UB and LB dressing in unsupported seated  position; Min A donning button-up shirt (increased time and assist w/ threading LUE/pulling shirt down) and shoes (assist w/ velcro fasteners); Mod I w/ buttons, donning/doffing shorts, and doffing shirt and shoes    Coordination Activities 9HPT x2 w/ R, dominant UE to facilitate increased [place], visual perception, and speed of processing while also addressing LTG2  Box and Blocks completed w/ LUE to facilitate unilateral GMC, visual perception, and processing  speed while continuing to address STG3; pt completed 7 blocks in 30 sec and 12 blocks in 1 min         OT Short Term Goals - 04/28/21 1409       OT SHORT TERM GOAL #1   Title Pt will be able to sequence through one BADL task (e.g., donning pants, washing UB) w/ SPV in at least 2 trials    Baseline Min A w/ BADL tasks    Time 6    Period Weeks    Status Achieved   04/12/21 - LB dressing   Target Date 04/01/21      OT SHORT TERM GOAL #2   Title Pt will improve participation in functional FM tasks as evidenced by completing 9-HPT w/ L hand in less than 3 minutes    Baseline 9-HPT w/ R hand (1 min, 14 sec); L hand (>3 min)    Time 6    Period Weeks    Status Achieved   04/05/21 - 1 min, 46 sec w/ LUE/1 drop     OT SHORT TERM GOAL #3   Title Pt will perform simple visual scanning at table with at least 90% accuracy.    Baseline Visual-perceptual deficits    Time 6    Period Weeks    Status Achieved   04/28/21 - 90%; 04/22/21 - 85%; 03/29/21 - 67% accuracy     OT SHORT TERM GOAL #4   Title Pt will improve GM coordination during BADLs as evidenced by increasing Box and Blocks test by at least 4 blocks    Baseline Box and Blocks (30 seconds) w/ R hand (9); L hand (4)    Time 6    Period Weeks    Status Partially Met   04/28/21 - RUE 13 / LUE 7; 03/29/21 - RUE 12 / LUE 6            OT Long Term Goals - 05/05/21 1432       OT LONG TERM GOAL #1   Title Pt will be able to participate in HEP for strength, coordination, and visual-perception w/ SPV    Baseline No HEP at this time    Time 12    Period Weeks    Status On-going      OT LONG TERM GOAL #2   Title Pt will improve participation in functional FM tasks as evidenced by decreasing 9-HPT time w/ R, dominant hand by at least 14 seconds    Baseline 9-HPT w/ R hand (1 min, 14 sec); L hand (>3 min)    Time 12    Period Weeks    Status On-going   05/05/21 - 1 min, 7 sec w/ RUE     OT LONG TERM GOAL #3   Title Pt will be able to  complete simple IADL task (e.g., washing plastic dish, folding towel) w/ SPV for safety in at least 2 trials    Baseline Very minimal participation in IADLs at this time    Time 12    Period Weeks  Status On-going      OT LONG TERM GOAL #4   Title Pt will be able to complete UB and LB dressing w/ SPV for safety    Baseline Completing BADLs w/ Min A, per spouse report    Time 12    Period Weeks    Status Revised             Plan - 05/05/21 1731     Clinical Impression Statement Pt is slowly progressing toward goals and has demo'd improvements in coordination, L-sided awareness, and visual perception. Pt was also able to complete doffing of open-front shirt, donning/doffing shorts, and doffing shoes w/ Mod I and donning open-front shirt and shoes w/ Min A, requiring extended time for processing and movement. Pt will continue to benefit from skilled occupational therapy to further address independence w/ BADLs and simple IADL tasks, functional balance and mobility, coordination, L-sided inattention, and implementation of pt-specific HEP to improve QOL and decrease caregiver strain.    OT Occupational Profile and History Detailed Assessment- Review of Records and additional review of physical, cognitive, psychosocial history related to current functional performance    Occupational performance deficits (Please refer to evaluation for details): ADL's;IADL's;Work;Leisure;Social Participation    Body Structure / Function / Physical Skills ADL;ROM;UE functional use;Balance;Decreased knowledge of use of DME;FMC;Mobility;Vestibular;Body mechanics;Dexterity;Gait;Sensation;Vision;GMC;Strength;Proprioception;IADL;Coordination    Cognitive Skills Attention;Problem Solve;Safety Awareness;Sequencing    Rehab Potential Good    Clinical Decision Making Several treatment options, min-mod task modification necessary    Comorbidities Affecting Occupational Performance: May have comorbidities impacting  occupational performance    Modification or Assistance to Complete Evaluation  Min-Moderate modification of tasks or assist with assess necessary to complete eval    OT Frequency 1x / week    OT Duration 12 weeks    OT Treatment/Interventions Self-care/ADL training;Therapeutic exercise;Visual/perceptual remediation/compensation;Aquatic Therapy;Moist Heat;Cryotherapy;Neuromuscular education;Patient/family education;Fluidtherapy;Energy conservation;Therapist, nutritional;Therapeutic activities;Balance training;DME and/or AE instruction;Manual Therapy;Passive range of motion;Cognitive remediation/compensation    Plan Implement HEP for L-sided awareness, coordination, ROM, and visual perception; continue w/ large amplitude movements and LUE wt-bearing/shifting for increased L-sided awareness    Consulted and Agree with Plan of Care Patient;Family member/caregiver    Family Member Consulted Wife Transport planner)             Patient will benefit from skilled therapeutic intervention in order to improve the following deficits and impairments:   Body Structure / Function / Physical Skills: ADL, ROM, UE functional use, Balance, Decreased knowledge of use of DME, FMC, Mobility, Vestibular, Body mechanics, Dexterity, Gait, Sensation, Vision, GMC, Strength, Proprioception, IADL, Coordination Cognitive Skills: Attention, Problem Solve, Safety Awareness, Sequencing   Visit Diagnosis: Other lack of coordination  Muscle weakness (generalized)  Unspecified symptoms and signs involving cognitive functions following cerebral infarction  Frontal lobe and executive function deficit  Visuospatial deficit  Other abnormalities of gait and mobility    Problem List Patient Active Problem List   Diagnosis Date Noted   Ankle edema, bilateral 02/16/2021   Left hemiparesis ([place]) 12/06/2020   Benign essential HTN    Cognitive deficit, post-stroke    Controlled type 2 diabetes mellitus with hyperglycemia,  without long-term current use of insulin ([name])    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 [place], [place], [address] Phone: [phone]   Fax:  [phone]  Name: Joe RADLER Sr. MRN: [id number] Date of Birth: 11/17/1950

## 2021-05-10 ENCOUNTER — Encounter: Payer: Self-pay | Admitting: Physical Therapy

## 2021-05-10 ENCOUNTER — Ambulatory Visit: Payer: Medicare Other | Admitting: Occupational Therapy

## 2021-05-10 ENCOUNTER — Ambulatory Visit: Payer: Medicare Other | Admitting: Physical Therapy

## 2021-05-10 ENCOUNTER — Other Ambulatory Visit: Payer: Self-pay

## 2021-05-10 DIAGNOSIS — I69319 Unspecified symptoms and signs involving cognitive functions following cerebral infarction: Secondary | ICD-10-CM | POA: Diagnosis not present

## 2021-05-10 DIAGNOSIS — R260 Ataxic gait: Secondary | ICD-10-CM

## 2021-05-10 DIAGNOSIS — M6281 Muscle weakness (generalized): Secondary | ICD-10-CM

## 2021-05-10 DIAGNOSIS — R278 Other lack of coordination: Secondary | ICD-10-CM

## 2021-05-10 DIAGNOSIS — R2681 Unsteadiness on feet: Secondary | ICD-10-CM | POA: Diagnosis not present

## 2021-05-10 DIAGNOSIS — R2689 Other abnormalities of gait and mobility: Secondary | ICD-10-CM

## 2021-05-10 DIAGNOSIS — R41844 Frontal lobe and executive function deficit: Secondary | ICD-10-CM

## 2021-05-10 DIAGNOSIS — R41842 Visuospatial deficit: Secondary | ICD-10-CM

## 2021-05-10 NOTE — Therapy (Signed)
[facility]. [place], [place], [address] Phone: [phone]   Fax:  [phone]  Physical Therapy Treatment  Patient Details  Name: Joe KLINGERMAN Sr. MRN: [id number] Date of Birth: 09/17/1950 Referring Provider (PT): Asencion Noble, MD   Encounter Date: 05/10/2021   PT End of Session - 05/10/21 1526     Visit Number 29    Number of Visits 62    Date for PT Re-Evaluation 05/31/21    Authorization Type UHC Medicare    PT Start Time 2683    PT Stop Time 1528    PT Time Calculation (min) 44 min    Activity Tolerance Patient tolerated treatment well    Behavior During Therapy Flat affect             Past Medical History:  Diagnosis Date   Acute metabolic encephalopathy 12/21/6220   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 ([place]) 06/03/2019   Cerebrovascular accident (CVA) due to occlusion of vertebral artery ([name])    Diabetes mellitus without complication ([place])    Dysphagia    post stroke   Hypertension    Left upper lobe pneumonia 02/15/2021   Stroke [facility])     Past Surgical History:  Procedure Laterality Date   IR ANGIO INTRA EXTRACRAN SEL COM CAROTID INNOMINATE UNI L MOD SED  03/10/2019   IR ANGIO VERTEBRAL SEL SUBCLAVIAN INNOMINATE BILAT MOD SED  03/10/2019   IR CT HEAD LTD  03/10/2019   IR PERCUTANEOUS ART THROMBECTOMY/INFUSION INTRACRANIAL INC DIAG ANGIO  03/10/2019   RADIOLOGY WITH ANESTHESIA N/A 03/10/2019   Procedure: IR WITH ANESTHESIA/CODE STROKE;  Surgeon: Radiologist, Medication, MD;  Location: [facility];  Service: Radiology;  Laterality: N/A;    There were no vitals filed for this visit.   Subjective Assessment - 05/10/21 1452     Subjective Wife reports that she could tell patient was a little more tired after the last visit, I did more leg strength and walking with him lat visit                               [facility]  Treatment/Exercise - 05/10/21 0001       Ambulation/Gait   Gait Comments single HHA gait 2x 110' with the cues for bigger steps and posture      High Level Balance   High Level Balance Comments seated ball toss, standing ball toss and standing ball kick, close CGA with the ball toss, min/mod A for the ball kicks, could only kick with the right foot, seated ball kicks, used FWW 6" toe touches      Knee/Hip Exercises: Aerobic   Nustep level 6 x 6 minutes UE and LE      Knee/Hip Exercises: Machines for Strengthening   Cybex Knee Extension 10# 2x10, 5# left only 2x5 a lot of cues for the slow eccentric phase    Cybex Knee Flexion 25# 2x10    Other Machine standing 5# shoulder extension                      PT Short Term Goals - 04/19/21 1704       PT SHORT TERM GOAL #1   Title Pt will perform HEP with wife's assistance, for improved strength, balance, gait.    Time 3    Period Weeks    Status On-going  wife reports difficulty with getting [name] to do exercises at home- suggested caregiver/CNA for support at home and adult daycare services for improved participation and caregiver respite   Target Date 05/10/21               PT Long Term Goals - 05/05/21 1528       PT LONG TERM GOAL #1   Title Pt will perform progressive HEP with family supervision and assistance for improved balance, strength, gait.    Status Partially Met                   Plan - 05/10/21 1553     Clinical Impression Statement trying to get him to walk more, still shuffles can correct with cues but goes right back to the shuffling, he also has some uncontrolled extension of the left knee.  I am having him do one hand HHA with verbal cues for the step length    PT Next Visit Plan continue to work on his awareness of posture and work on this, also look to see if we can get better control of the left knee with walking - possibly use ace wrap or TB wrapping technique to prevent  hyperextension in stance during gait training. More stretching of right side and knee control, ability to get up from floor    Consulted and Agree with Plan of Care Patient;Family member/caregiver    Family Member Consulted wife             Patient will benefit from skilled therapeutic intervention in order to improve the following deficits and impairments:  Abnormal gait, Decreased coordination, Difficulty walking, Decreased safety awareness, Decreased balance, Decreased strength, Decreased mobility  Visit Diagnosis: Other lack of coordination  Muscle weakness (generalized)  Other abnormalities of gait and mobility  Unsteadiness on feet  Ataxic gait     Problem List Patient Active Problem List   Diagnosis Date Noted   Ankle edema, bilateral 02/16/2021   Left hemiparesis ([place]) 12/06/2020   Benign essential HTN    Cognitive deficit, post-stroke    Controlled type 2 diabetes mellitus with hyperglycemia, without long-term current use of insulin ([name])    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 [place], [place], [address] Phone: [phone]   Fax:  [phone]  Name: Joe CRITE Sr. MRN: [id number] Date of Birth: Oct 06, 1950

## 2021-05-10 NOTE — Patient Instructions (Signed)
  SELF ASSISTED WITH OBJECT: Shoulder Flexion - Sitting    Hold cane with both hands. Raise arms up. 10 reps per set, 2-3 sets per day  SELF ASSISTED WITH OBJECT: Trunk Rotation    Hold cane with both hands. Rotate trunk left, then right. Keep back straight and your head up. 5 reps each side per set, 2-3 sets per day  Copyright  VHI. All rights reserved.

## 2021-05-11 ENCOUNTER — Encounter: Payer: Medicare Other | Admitting: Physical Therapy

## 2021-05-11 ENCOUNTER — Encounter: Payer: Medicare Other | Admitting: Occupational Therapy

## 2021-05-11 NOTE — Therapy (Signed)
[place] [place], [place], [address] Phone: [phone]   Fax:  [phone]  Occupational Therapy Treatment  Patient Details  Name: Joe JACUINDE Sr. MRN: [id number] Date of Birth: [birth year] Referring Provider (OT): Asencion Noble, MD   Encounter Date: 05/10/2021   OT End of Session - 05/10/21 1410     Visit Number 11    Number of Visits 13    Date for OT Re-Evaluation 06/10/21    Authorization Type UHC Medicare; BCBS state (secondary)    Authorization Time Period VL: [place]    Progress Note Due on Visit 10    OT Start Time 1405    OT Stop Time 1445    OT Time Calculation (min) 40 min    Activity Tolerance Patient tolerated treatment well    Behavior During Therapy Flat affect            Past Medical History:  Diagnosis Date   Acute metabolic encephalopathy 05/20/3845   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 ([place]) 06/03/2019   Cerebrovascular accident (CVA) due to occlusion of vertebral artery ([name])    Diabetes mellitus without complication ([place])    Dysphagia    post stroke   Hypertension    Left upper lobe pneumonia 02/15/2021   Stroke [facility])     Past Surgical History:  Procedure Laterality Date   IR ANGIO INTRA EXTRACRAN SEL COM CAROTID INNOMINATE UNI L MOD SED  03/10/2019   IR ANGIO VERTEBRAL SEL SUBCLAVIAN INNOMINATE BILAT MOD SED  03/10/2019   IR CT HEAD LTD  03/10/2019   IR PERCUTANEOUS ART THROMBECTOMY/INFUSION INTRACRANIAL INC DIAG ANGIO  03/10/2019   RADIOLOGY WITH ANESTHESIA N/A 03/10/2019   Procedure: IR WITH ANESTHESIA/CODE STROKE;  Surgeon: Radiologist, Medication, MD;  Location: [place];  Service: Radiology;  Laterality: N/A;    There were no vitals filed for this visit.   Subjective Assessment - 05/10/21 1410     Subjective  Pt nodded, indicating "yes" when asked if he felt better sitting in a chair w/ a back    Patient is accompanied by:  Family member   Wife [name]   Pertinent History Hx of CVA (2017, March 2020, July 2020); T2DM; HTN    Limitations Fall risk; L homonymous hemianopia (per chart review); dysphagia; cognitive-communication deficit    Patient Stated Goals Get stronger; improve walking; improve independence    Currently in Pain? No/denies             Treatment/Exercises - 05/10/21    IADL: Housekeeping Task Completed simulated laundry tasking involving folding towels to facilitate sequencing during functional tasks, [place] and bilateral coordination, and improved participation in IADL tasks at home. Pt completed activity w/ Mod I while seated, requiring extended time/no add'l cues for success    Self-Assisted ROM Closed chain BUE overhead press and trunk rotation w/ BUEs extended holding unweighted dowel completed 10x each to facilitate bilateral integration, L-sided awareness/kinesthetic sense, and increased functional overhead reach. Due to difficulty maintaining upright position (significant leaning posteriorly while sitting unsupported), OT positioned large exercise ball behind pt and provided verbal cues and tactile facilitation at shoulders to facilitate trunk alignment during exercises    Coordination Activities Flipping playing cards over w/ each hand; after initial modeling, pt was able to complete activity w/out difficulty, demo'ing decreased speed of movement  Using thumb to push card off w/ emphasis on pushing each card w/ 1 strong movement; completed w/  each hand. Pt required verbal cues/min modeling for success and demo'd increased difficulty w/ L hand     Functional Mobility Ambulating w/ mod HH assist to transition out of session; required mod verbal cues for increased hip flexion during swing phase w/ LLE         OT Education - 05/10/21 1745     Education Details Education provided to pt and his wife for cane exercises pt is able to complete at home (see pt instructions)    Person(s) Educated  Spouse    Methods Explanation;Handout;Demonstration;Tactile cues;Verbal cues    Comprehension Returned demonstration;Verbalized understanding             OT Short Term Goals - 04/28/21 1409       OT SHORT TERM GOAL #1   Title Pt will be able to sequence through one BADL task (e.g., donning pants, washing UB) w/ SPV in at least 2 trials    Baseline Min A w/ BADL tasks    Time 6    Period Weeks    Status Achieved   04/12/21 - LB dressing   Target Date 04/01/21      OT SHORT TERM GOAL #2   Title Pt will improve participation in functional FM tasks as evidenced by completing 9-HPT w/ L hand in less than 3 minutes    Baseline 9-HPT w/ R hand (1 min, 14 sec); L hand (>3 min)    Time 6    Period Weeks    Status Achieved   04/05/21 - 1 min, 46 sec w/ LUE/1 drop     OT SHORT TERM GOAL #3   Title Pt will perform simple visual scanning at table with at least 90% accuracy.    Baseline Visual-perceptual deficits    Time 6    Period Weeks    Status Achieved   04/28/21 - 90%; 04/22/21 - 85%; 03/29/21 - 67% accuracy     OT SHORT TERM GOAL #4   Title Pt will improve GM coordination during BADLs as evidenced by increasing Box and Blocks test by at least 4 blocks    Baseline Box and Blocks (30 seconds) w/ R hand (9); L hand (4)    Time 6    Period Weeks    Status Partially Met   04/28/21 - RUE 13 / LUE 7; 03/29/21 - RUE 12 / LUE 6            OT Long Term Goals - 05/10/21 1411       OT LONG TERM GOAL #1   Title Pt will be able to participate in HEP for strength, coordination, and visual-perception w/ SPV    Baseline No HEP at this time    Time 12    Period Weeks    Status On-going      OT LONG TERM GOAL #2   Title Pt will improve participation in functional FM tasks as evidenced by decreasing 9-HPT time w/ R, dominant hand by at least 14 seconds    Baseline 9-HPT w/ R hand (1 min, 14 sec); L hand (>3 min)    Time 12    Period Weeks    Status On-going   05/05/21 - 1 min, 7 sec w/ RUE      OT LONG TERM GOAL #3   Title Pt will be able to complete simple IADL task (e.g., washing plastic dish, folding towel) w/ SPV for safety in at least 2 trials    Baseline Very minimal  participation in IADLs at this time    Time 12    Period Weeks    Status Achieved   05/10/21 - completed IADL task w/ Mod I in 3/3 trials     OT LONG TERM GOAL #4   Title Pt will be able to complete UB and LB dressing w/ SPV for safety    Baseline Completing BADLs w/ Min A, per spouse report    Time 12    Period Weeks    Status On-going             Plan - 05/11/21 1748     Clinical Impression Statement In anticipation for upcoming d/c, OT facilitated large amplitude, closed-chain exercises using a walking cane that pt is able to easily and safely complete at home to specifically target GMC/coordination, bradykinesia, and L-sided awareness; handout provided and discussed w/ pt's wife at conclusion of session. While completing overhead chest press sitting EOM, pt demo'd significant trunk leaning posteriorly. Due to this, OT incorporated large exercise ball behind pt w/ tactile cueing at shoulders for improved upright positioning w/ positive results.    OT Occupational Profile and History Detailed Assessment- Review of Records and additional review of physical, cognitive, psychosocial history related to current functional performance    Occupational performance deficits (Please refer to evaluation for details): ADL's;IADL's;Work;Leisure;Social Participation    Body Structure / Function / Physical Skills ADL;ROM;UE functional use;Balance;Decreased knowledge of use of DME;FMC;Mobility;Vestibular;Body mechanics;Dexterity;Gait;Sensation;Vision;GMC;Strength;Proprioception;IADL;Coordination    Cognitive Skills Attention;Problem Solve;Safety Awareness;Sequencing    Rehab Potential Good    Clinical Decision Making Several treatment options, min-mod task modification necessary    Comorbidities Affecting Occupational  Performance: May have comorbidities impacting occupational performance    Modification or Assistance to Complete Evaluation  Min-Moderate modification of tasks or assist with assess necessary to complete eval    OT Frequency 1x / week    OT Duration 12 weeks    OT Treatment/Interventions Self-care/ADL training;Therapeutic exercise;Visual/perceptual remediation/compensation;Aquatic Therapy;Moist Heat;Cryotherapy;Neuromuscular education;Patient/family education;Fluidtherapy;Energy conservation;Therapist, nutritional;Therapeutic activities;Balance training;DME and/or AE instruction;Manual Therapy;Passive range of motion;Cognitive remediation/compensation    Plan Coordination HEP handout? Continue w/ large amplitude movements and LUE wt-bearing/shifting for increased L-sided awareness    Consulted and Agree with Plan of Care Patient;Family member/caregiver    Family Member Consulted Wife Transport planner)             Patient will benefit from skilled therapeutic intervention in order to improve the following deficits and impairments:   Body Structure / Function / Physical Skills: ADL, ROM, UE functional use, Balance, Decreased knowledge of use of DME, FMC, Mobility, Vestibular, Body mechanics, Dexterity, Gait, Sensation, Vision, GMC, Strength, Proprioception, IADL, Coordination Cognitive Skills: Attention, Problem Solve, Safety Awareness, Sequencing   Visit Diagnosis: Muscle weakness (generalized)  Other lack of coordination  Other abnormalities of gait and mobility  Unspecified symptoms and signs involving cognitive functions following cerebral infarction  Frontal lobe and executive function deficit  Visuospatial deficit    Problem List Patient Active Problem List   Diagnosis Date Noted   Ankle edema, bilateral 02/16/2021   Left hemiparesis ([place]) 12/06/2020   Benign essential HTN    Cognitive deficit, post-stroke    Controlled type 2 diabetes mellitus with hyperglycemia, without  long-term current use of insulin ([name])    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 [place], [place], [address] Phone: [phone]   Fax:  [phone]  Name: Joe MALAY Sr. MRN: [id number] Date of Birth: Feb 02, 1951

## 2021-05-12 ENCOUNTER — Encounter: Payer: Medicare Other | Admitting: Occupational Therapy

## 2021-05-16 ENCOUNTER — Encounter: Payer: Self-pay | Admitting: Critical Care Medicine

## 2021-05-16 ENCOUNTER — Other Ambulatory Visit: Payer: Self-pay

## 2021-05-16 ENCOUNTER — Ambulatory Visit: Payer: Medicare Other | Attending: Critical Care Medicine | Admitting: Critical Care Medicine

## 2021-05-16 VITALS — BP 140/83 | HR 85 | Resp 16 | Wt 176.8 lb

## 2021-05-16 DIAGNOSIS — E785 Hyperlipidemia, unspecified: Secondary | ICD-10-CM

## 2021-05-16 DIAGNOSIS — I69391 Dysphagia following cerebral infarction: Secondary | ICD-10-CM

## 2021-05-16 DIAGNOSIS — I6930 Unspecified sequelae of cerebral infarction: Secondary | ICD-10-CM

## 2021-05-16 DIAGNOSIS — E1159 Type 2 diabetes mellitus with other circulatory complications: Secondary | ICD-10-CM

## 2021-05-16 DIAGNOSIS — I69319 Unspecified symptoms and signs involving cognitive functions following cerebral infarction: Secondary | ICD-10-CM

## 2021-05-16 DIAGNOSIS — E1165 Type 2 diabetes mellitus with hyperglycemia: Secondary | ICD-10-CM

## 2021-05-16 DIAGNOSIS — Z23 Encounter for immunization: Secondary | ICD-10-CM

## 2021-05-16 DIAGNOSIS — I69398 Other sequelae of cerebral infarction: Secondary | ICD-10-CM

## 2021-05-16 DIAGNOSIS — R269 Unspecified abnormalities of gait and mobility: Secondary | ICD-10-CM | POA: Diagnosis not present

## 2021-05-16 DIAGNOSIS — I10 Essential (primary) hypertension: Secondary | ICD-10-CM | POA: Diagnosis not present

## 2021-05-16 MED ORDER — AMLODIPINE BESYLATE 5 MG PO TABS: 5.0000 mg | ORAL_TABLET | Freq: Every day | ORAL | 2 refills | Status: DC

## 2021-05-16 MED ORDER — ATORVASTATIN CALCIUM 40 MG PO TABS: ORAL_TABLET | ORAL | 2 refills | Status: DC

## 2021-05-16 MED ORDER — DONEPEZIL HCL 10 MG PO TABS: 10.0000 mg | ORAL_TABLET | Freq: Every day | ORAL | 1 refills | Status: DC

## 2021-05-16 MED ORDER — METFORMIN HCL 500 MG PO TABS: 500.0000 mg | ORAL_TABLET | Freq: Two times a day (BID) | ORAL | 2 refills | Status: DC

## 2021-05-16 MED ORDER — QUETIAPINE FUMARATE 25 MG PO TABS: 50.0000 mg | ORAL_TABLET | Freq: Every day | ORAL | 3 refills | Status: DC

## 2021-05-16 MED ORDER — PROMETHAZINE-DM 6.25-15 MG/5ML PO SYRP: 5.0000 mL | ORAL_SOLUTION | Freq: Four times a day (QID) | ORAL | 1 refills | Status: DC | PRN

## 2021-05-16 NOTE — Assessment & Plan Note (Signed)
Diabetes well controlled continue current medications

## 2021-05-16 NOTE — Progress Notes (Signed)
Established Patient Office Visit  Subjective:  Patient ID: Joe Perry., male    DOB: 10-27-1950  Age: 70 y.o. MRN: [id number]  CC:  Chief Complaint  Patient presents with   Diabetes   Hypertension    HPI KENDON SEDENO Sr. presents for primary care follow-up.  Patient has no complaints.  He is still having a mild cough.  Speech pathology and physical therapy has signed off his case.  He has reached maximal benefit after his stroke.  Blood sugars have been good recently.  On arrival blood pressure is at goal.  He has had no further fever or pneumonia.  His sons were not able to visit yet from [place] and he needs his letter reproduced with our clinic stamp to be excepted.  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 ([name])    Diabetes mellitus without complication ([place])    Dysphagia    post stroke   Hypertension    Left upper lobe pneumonia 02/15/2021   Stroke [facility])     Past Surgical History:  Procedure Laterality Date   IR ANGIO INTRA EXTRACRAN SEL COM CAROTID INNOMINATE UNI L MOD SED  03/10/2019   IR ANGIO VERTEBRAL SEL SUBCLAVIAN INNOMINATE BILAT MOD SED  03/10/2019   IR CT HEAD LTD  03/10/2019   IR PERCUTANEOUS ART THROMBECTOMY/INFUSION INTRACRANIAL INC DIAG ANGIO  03/10/2019   RADIOLOGY WITH ANESTHESIA N/A 03/10/2019   Procedure: IR WITH ANESTHESIA/CODE STROKE;  Surgeon: Radiologist, Medication, MD;  Location: [place];  Service: Radiology;  Laterality: N/A;    Family History  Problem Relation Age of Onset   Diabetes Other    Hypertension Other    Healthy Mother    Healthy Father     Social History   Socioeconomic History   Marital status: Married    Spouse name: [name]   Number of  children: Not on file   Years of education: Not on file   Highest education level: Not on file  Occupational History   Not on file  Tobacco Use   Smoking status: Never   Smokeless tobacco: Never  Vaping Use   Vaping Use: Never used  Substance and Sexual Activity   Alcohol use: No   Drug use: No   Sexual activity: Not Currently  Other Topics Concern   Not on file  Social History Narrative   12/16/19 Lives with wife   Social Determinants of Health   Financial Resource Strain: Not on file  Food Insecurity: Not on file  Transportation Needs: Not on file  Physical Activity: Not on file  Stress: Not on file  Social Connections: Not on file  Intimate Partner Violence: Not on file    Outpatient Medications Prior to Visit  Medication Sig Dispense Refill   acetaminophen (TYLENOL) 325 MG tablet Take 2 tablets (650 mg total) by mouth every 4 (four) hours as needed for mild pain (or temp > 37.5 C (99.5 F)).     aspirin EC 81 MG tablet Take 1 tablet (81 mg total) by mouth daily. 100 tablet 3   cetirizine (ZYRTEC) 10 MG tablet Take 1 tablet (10 mg total) by mouth daily. 30 tablet 11   amLODipine (NORVASC) 5 MG tablet Take 1 tablet (  5 mg total) by mouth daily. To lower blood pressure 90 tablet 2   atorvastatin (LIPITOR) 40 MG tablet One daily in the evening to lower cholesterol 90 tablet 2   donepezil (ARICEPT) 10 MG tablet TAKE 1 TABLET BY MOUTH AT BEDTIME 90 tablet 1   metFORMIN (GLUCOPHAGE) 500 MG tablet TAKE 1 TABLET BY MOUTH TWICE DAILY WITH A MEAL 180 tablet 0   promethazine-dextromethorphan (PROMETHAZINE-DM) 6.25-15 MG/5ML syrup Take 5 mLs by mouth 4 (four) times daily as needed for cough. 118 mL 0   QUEtiapine (SEROQUEL) 25 MG tablet Take 2 tablets (50 mg total) by mouth at bedtime. 60 tablet 3   No facility-administered medications prior to visit.    No Known Allergies  ROS Review of Systems  Constitutional:  Negative for chills, diaphoresis and fever.  HENT:  Negative for  congestion, ear discharge, ear pain, hearing loss, nosebleeds, sore throat and tinnitus.   Eyes:  Negative for photophobia and discharge.  Respiratory:  Positive for cough. Negative for shortness of breath, wheezing and stridor.        No excess mucus  Cardiovascular:  Negative for chest pain, palpitations and leg swelling.  Gastrointestinal:  Negative for abdominal pain, blood in stool, constipation, diarrhea, nausea and vomiting.  Endocrine: Negative for polydipsia.  Genitourinary:  Negative for dysuria, flank pain, frequency, hematuria and urgency.  Musculoskeletal:  Positive for gait problem. Negative for back pain, myalgias and neck pain.  Skin:  Negative for rash.  Allergic/Immunologic: Negative for environmental allergies.  Neurological:  Negative for dizziness, tremors, seizures, weakness and headaches.  Hematological:  Does not bruise/bleed easily.  Psychiatric/Behavioral:  Negative for hallucinations and suicidal ideas. The patient is not nervous/anxious.   All other systems reviewed and are negative.    Objective:    Physical Exam Vitals reviewed.  Constitutional:      Appearance: Normal appearance. He is well-developed. He is not diaphoretic.  HENT:     Head: Normocephalic and atraumatic.     Nose: No nasal deformity, septal deviation, mucosal edema or rhinorrhea.     Right Sinus: No maxillary sinus tenderness or frontal sinus tenderness.     Left Sinus: No maxillary sinus tenderness or frontal sinus tenderness.     Mouth/Throat:     Pharynx: No oropharyngeal exudate.  Eyes:     General: No scleral icterus.    Conjunctiva/sclera: Conjunctivae normal.     Pupils: Pupils are equal, round, and reactive to light.  Neck:     Thyroid: No thyromegaly.     Vascular: No carotid bruit or JVD.     Trachea: Trachea normal. No tracheal tenderness or tracheal deviation.  Cardiovascular:     Rate and Rhythm: Normal rate and regular rhythm.     Chest Wall: PMI is not displaced.      Pulses: Normal pulses. No decreased pulses.     Heart sounds: Normal heart sounds, S1 normal and S2 normal. Heart sounds not distant. No murmur heard. No systolic murmur is present.  No diastolic murmur is present.    No friction rub. No gallop. No S3 or S4 sounds.  Pulmonary:     Effort: No tachypnea, accessory muscle usage or respiratory distress.     Breath sounds: No stridor. No decreased breath sounds, wheezing, rhonchi or rales.  Chest:     Chest wall: No tenderness.  Abdominal:     General: Bowel sounds are normal. There is no distension.     Palpations: Abdomen is soft. Abdomen  is not rigid.     Tenderness: There is no abdominal tenderness. There is no guarding or rebound.  Musculoskeletal:        General: Normal range of motion.     Cervical back: Normal range of motion and neck supple. No edema, erythema or rigidity. No muscular tenderness. Normal range of motion.  Lymphadenopathy:     Head:     Right side of head: No submental or submandibular adenopathy.     Left side of head: No submental or submandibular adenopathy.     Cervical: No cervical adenopathy.  Skin:    General: Skin is warm and dry.     Coloration: Skin is not pale.     Findings: No rash.     Nails: There is no clubbing.  Neurological:     Mental Status: He is alert and oriented to person, place, and time.     Sensory: No sensory deficit.     Motor: Weakness present.     Gait: Gait abnormal.  Psychiatric:        Mood and Affect: Mood normal.        Speech: Speech normal.        Behavior: Behavior normal.    BP 140/83   Pulse 85   Resp 16   Wt 176 lb 12.8 oz (80.2 kg)   SpO2 95%   BMI 23.98 kg/m  Wt Readings from Last 3 Encounters:  05/16/21 176 lb 12.8 oz (80.2 kg)  03/14/21 168 lb (76.2 kg)  02/17/21 171 lb 11.8 oz (77.9 kg)     Health Maintenance Due  Topic Date Due   Zoster Vaccines- Shingrix (1 of 2) Never done   COVID-19 Vaccine (4 - Booster for Pfizer series) 10/26/2020     There are no preventive care reminders to display for this patient.  Lab Results  Component Value Date   TSH 0.178 (L) 02/15/2021   Lab Results  Component Value Date   WBC 5.3 02/17/2021   HGB 12.3 (L) 02/17/2021   HCT 37.5 (L) 02/17/2021   MCV 93.1 02/17/2021   PLT 188 02/17/2021   Lab Results  Component Value Date   NA 140 02/17/2021   K 3.5 02/17/2021   CO2 29 02/17/2021   GLUCOSE 130 (H) 02/17/2021   BUN 9 02/17/2021   CREATININE 0.68 02/17/2021   BILITOT 1.3 (H) 02/15/2021   ALKPHOS 49 02/15/2021   AST 31 02/15/2021   ALT 25 02/15/2021   PROT 6.4 (L) 02/15/2021   ALBUMIN 3.4 (L) 02/15/2021   CALCIUM 9.2 02/17/2021   ANIONGAP 8 02/17/2021   EGFR 95 02/15/2021   Lab Results  Component Value Date   CHOL 159 03/11/2019   Lab Results  Component Value Date   HDL 45 03/11/2019   Lab Results  Component Value Date   LDLCALC 101 (H) 03/11/2019   Lab Results  Component Value Date   TRIG 67 03/11/2019   Lab Results  Component Value Date   CHOLHDL 3.5 03/11/2019   Lab Results  Component Value Date   HGBA1C 5.9 (H) 02/16/2021      Assessment & Plan:   Problem List Items Addressed This Visit       Cardiovascular and Mediastinum   Dysphagia due to recent stroke (Chronic)    Per speech pathology patient has maximized therapy he will continue current diet      Relevant Medications   amLODipine (NORVASC) 5 MG tablet   atorvastatin (LIPITOR) 40 MG tablet  Benign essential HTN (Chronic)    Blood pressure is at goal we will continue current medications without change      Relevant Medications   amLODipine (NORVASC) 5 MG tablet   atorvastatin (LIPITOR) 40 MG tablet     Endocrine   Controlled type 2 diabetes mellitus with hyperglycemia, without long-term current use of insulin ([name]) (Chronic)    Diabetes well controlled continue current medications      Relevant Medications   atorvastatin (LIPITOR) 40 MG tablet   metFORMIN (GLUCOPHAGE) 500 MG  tablet     Nervous and Auditory   Cognitive deficit, post-stroke (Chronic)   Relevant Medications   donepezil (ARICEPT) 10 MG tablet   QUEtiapine (SEROQUEL) 25 MG tablet     Other   Gait disturbance, post-stroke (Chronic)    Gait disturbance poststroke is improved continue use of walker with rehabilitation      Dyslipidemia, goal LDL below 70    Continue statins as prescribed      Relevant Medications   amLODipine (NORVASC) 5 MG tablet   atorvastatin (LIPITOR) 40 MG tablet   Other Visit Diagnoses     Need for immunization against influenza    -  Primary   Relevant Orders   Flu Vaccine QUAD 62moIM (Fluarix, Fluzone & Alfiuria Quad PF) (Completed)   Essential hypertension       Relevant Medications   amLODipine (NORVASC) 5 MG tablet   atorvastatin (LIPITOR) 40 MG tablet   History of CVA with residual deficit       Relevant Medications   atorvastatin (LIPITOR) 40 MG tablet   Type 2 diabetes mellitus with other circulatory complication, without long-term current use of insulin ([name])       Relevant Medications   atorvastatin (LIPITOR) 40 MG tablet   metFORMIN (GLUCOPHAGE) 500 MG tablet       Meds ordered this encounter  Medications   amLODipine (NORVASC) 5 MG tablet    Sig: Take 1 tablet (5 mg total) by mouth daily. To lower blood pressure    Dispense:  90 tablet    Refill:  2   atorvastatin (LIPITOR) 40 MG tablet    Sig: One daily in the evening to lower cholesterol    Dispense:  90 tablet    Refill:  2   donepezil (ARICEPT) 10 MG tablet    Sig: Take 1 tablet (10 mg total) by mouth at bedtime.    Dispense:  90 tablet    Refill:  1   metFORMIN (GLUCOPHAGE) 500 MG tablet    Sig: Take 1 tablet (500 mg total) by mouth 2 (two) times daily with a meal.    Dispense:  180 tablet    Refill:  2   promethazine-dextromethorphan (PROMETHAZINE-DM) 6.25-15 MG/5ML syrup    Sig: Take 5 mLs by mouth 4 (four) times daily as needed for cough.    Dispense:  240 mL    Refill:  1    QUEtiapine (SEROQUEL) 25 MG tablet    Sig: Take 2 tablets (50 mg total) by mouth at bedtime.    Dispense:  60 tablet    Refill:  3  Flu vaccine given I reproduced the patient's letter and we have fixed the stamp of the clinic over this will allow [place] Return in about 3 months (around 08/15/2021).    PAsencion Noble MD

## 2021-05-16 NOTE — Assessment & Plan Note (Signed)
Continue statins as prescribed

## 2021-05-16 NOTE — Assessment & Plan Note (Signed)
Blood pressure is at goal we will continue current medications without change

## 2021-05-16 NOTE — Assessment & Plan Note (Signed)
Gait disturbance poststroke is improved continue use of walker with rehabilitation

## 2021-05-16 NOTE — Patient Instructions (Signed)
Flu vaccine was given  Refills on all medications sent to your pharmacy  Refill the cough medicine was sent to the pharmacy  Return to see Dr. Joya Gaskins in 3 months

## 2021-05-16 NOTE — Assessment & Plan Note (Signed)
Per speech pathology patient has maximized therapy he will continue current diet

## 2021-05-22 ENCOUNTER — Ambulatory Visit (HOSPITAL_BASED_OUTPATIENT_CLINIC_OR_DEPARTMENT_OTHER): Payer: Medicare Other

## 2021-05-22 DIAGNOSIS — Z91199 Patient's noncompliance with other medical treatment and regimen due to unspecified reason: Secondary | ICD-10-CM

## 2021-05-22 DIAGNOSIS — Z5329 Procedure and treatment not carried out because of patient's decision for other reasons: Secondary | ICD-10-CM

## 2021-05-22 NOTE — Patient Instructions (Signed)
Health Maintenance, Male Adopting a healthy lifestyle and getting preventive care are important in promoting health and wellness. Ask your health care provider about: The right schedule for you to have regular tests and exams. Things you can do on your own to prevent diseases and keep yourself healthy. What should I know about diet, weight, and exercise? Eat a healthy diet  Eat a diet that includes plenty of vegetables, fruits, low-fat dairy products, and lean protein. Do not eat a lot of foods that are high in solid fats, added sugars, or sodium. Maintain a healthy weight Body mass index (BMI) is a measurement that can be used to identify possible weight problems. It estimates body fat based on height and weight. Your health care provider can help determine your BMI and help you achieve or maintain a healthy weight. Get regular exercise Get regular exercise. This is one of the most important things you can do for your health. Most adults should: Exercise for at least 150 minutes each week. The exercise should increase your heart rate and make you sweat (moderate-intensity exercise). Do strengthening exercises at least twice a week. This is in addition to the moderate-intensity exercise. Spend less time sitting. Even light physical activity can be beneficial. Watch cholesterol and blood lipids Have your blood tested for lipids and cholesterol at 68 years of age, then have this test every 5 years. You may need to have your cholesterol levels checked more often if: Your lipid or cholesterol levels are high. You are older than 68 years of age. You are at high risk for heart disease. What should I know about cancer screening? Many types of cancers can be detected early and may often be prevented. Depending on your health history and family history, you may need to have cancer screening at various ages. This may include screening for: Colorectal cancer. Prostate cancer. Skin cancer. Lung  cancer. What should I know about heart disease, diabetes, and high blood pressure? Blood pressure and heart disease High blood pressure causes heart disease and increases the risk of stroke. This is more likely to develop in people who have high blood pressure readings, are of African descent, or are overweight. Talk with your health care provider about your target blood pressure readings. Have your blood pressure checked: Every 3-5 years if you are 18-39 years of age. Every year if you are 40 years old or older. If you are between the ages of 65 and 75 and are a current or former smoker, ask your health care provider if you should have a one-time screening for abdominal aortic aneurysm (AAA). Diabetes Have regular diabetes screenings. This checks your fasting blood sugar level. Have the screening done: Once every three years after age 45 if you are at a normal weight and have a low risk for diabetes. More often and at a younger age if you are overweight or have a high risk for diabetes. What should I know about preventing infection? Hepatitis B If you have a higher risk for hepatitis B, you should be screened for this virus. Talk with your health care provider to find out if you are at risk for hepatitis B infection. Hepatitis C Blood testing is recommended for: Everyone born from 1945 through 1965. Anyone with known risk factors for hepatitis C. Sexually transmitted infections (STIs) You should be screened each year for STIs, including gonorrhea and chlamydia, if: You are sexually active and are younger than 68 years of age. You are older than 68 years   of age and your health care provider tells you that you are at risk for this type of infection. Your sexual activity has changed since you were last screened, and you are at increased risk for chlamydia or gonorrhea. Ask your health care provider if you are at risk. Ask your health care provider about whether you are at high risk for HIV.  Your health care provider may recommend a prescription medicine to help prevent HIV infection. If you choose to take medicine to prevent HIV, you should first get tested for HIV. You should then be tested every 3 months for as long as you are taking the medicine. Follow these instructions at home: Lifestyle Do not use any products that contain nicotine or tobacco, such as cigarettes, e-cigarettes, and chewing tobacco. If you need help quitting, ask your health care provider. Do not use street drugs. Do not share needles. Ask your health care provider for help if you need support or information about quitting drugs. Alcohol use Do not drink alcohol if your health care provider tells you not to drink. If you drink alcohol: Limit how much you have to 0-2 drinks a day. Be aware of how much alcohol is in your drink. In the [place] one drink equals one 12 oz bottle of beer (355 mL), one 5 oz glass of wine (148 mL), or one 1 oz glass of hard liquor (44 mL). General instructions Schedule regular health, dental, and eye exams. Stay current with your vaccines. Tell your health care provider if: You often feel depressed. You have ever been abused or do not feel safe at home. Summary Adopting a healthy lifestyle and getting preventive care are important in promoting health and wellness. Follow your health care provider's instructions about healthy diet, exercising, and getting tested or screened for diseases. Follow your health care provider's instructions on monitoring your cholesterol and blood pressure. This information is not intended to replace advice given to you by your health care provider. Make sure you discuss any questions you have with your health care provider. Document Revised: 10/29/2020 Document Reviewed: 08/14/2018 Elsevier Patient Education  2022 Elsevier Inc.  

## 2021-06-15 DIAGNOSIS — H524 Presbyopia: Secondary | ICD-10-CM | POA: Diagnosis not present

## 2021-06-15 DIAGNOSIS — H52203 Unspecified astigmatism, bilateral: Secondary | ICD-10-CM | POA: Diagnosis not present

## 2021-06-15 DIAGNOSIS — H2513 Age-related nuclear cataract, bilateral: Secondary | ICD-10-CM | POA: Diagnosis not present

## 2021-06-15 DIAGNOSIS — E119 Type 2 diabetes mellitus without complications: Secondary | ICD-10-CM | POA: Diagnosis not present

## 2021-06-15 LAB — HM DIABETES EYE EXAM

## 2021-08-01 ENCOUNTER — Encounter: Payer: Self-pay | Admitting: Critical Care Medicine

## 2021-08-01 ENCOUNTER — Telehealth: Payer: Self-pay | Admitting: Critical Care Medicine

## 2021-08-01 MED ORDER — AMOXICILLIN-POT CLAVULANATE 875-125 MG PO TABS: 1.0000 | ORAL_TABLET | Freq: Two times a day (BID) | ORAL | 0 refills | Status: DC

## 2021-08-01 MED ORDER — FUROSEMIDE 20 MG PO TABS: 20.0000 mg | ORAL_TABLET | Freq: Every day | ORAL | 0 refills | Status: DC

## 2021-08-01 NOTE — Telephone Encounter (Signed)
Pt with productive cough , hx of dysphagia post stroke  Rx augmentin for 7 days bid  Also lasix 20mg  daily x 2 days for pedal edema  Pt had stopped ASA, told to get it OTC 81mg  daily for stroke prevention  Carly We need to get this pt in to clinic as addon in next 2-3 weeks face to face

## 2021-08-02 NOTE — Telephone Encounter (Signed)
He has an appointment on 12/12 for you

## 2021-08-14 NOTE — Progress Notes (Signed)
Established Patient Office Visit  Subjective:  Patient ID: Joe Jeanpaul., male    DOB: Jan 14, 1951  Age: 70 y.o. MRN: [id number]  CC:  Chief Complaint  Patient presents with   Hypertension   Diabetes    HPI Joe CARLL Sr. presents for primary care follow-up visit The patient is accompanied by his spouse who states he has been having more swelling the lower extremities and will be called in some furosemide low-dose this had resolved itself.  The patient also has a cough with some rattling in the throat.  He wanted to be examined for this.  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 ([place]) 06/03/2019   Cerebrovascular accident (CVA) due to occlusion of vertebral artery ([name])    Diabetes mellitus without complication ([place])    Dysphagia    post stroke   Hypertension    Left upper lobe pneumonia 02/15/2021   Stroke [facility])     Past Surgical History:  Procedure Laterality Date   IR ANGIO INTRA EXTRACRAN SEL COM CAROTID INNOMINATE UNI L MOD SED  03/10/2019   IR ANGIO VERTEBRAL SEL SUBCLAVIAN INNOMINATE BILAT MOD SED  03/10/2019   IR CT HEAD LTD  03/10/2019   IR PERCUTANEOUS ART THROMBECTOMY/INFUSION INTRACRANIAL INC DIAG ANGIO  03/10/2019   RADIOLOGY WITH ANESTHESIA N/A 03/10/2019   Procedure: IR WITH ANESTHESIA/CODE STROKE;  Surgeon: Radiologist, Medication, MD;  Location: [facility];  Service: Radiology;  Laterality: N/A;    Family History  Problem Relation Age of Onset   Diabetes Other    Hypertension Other    Healthy Mother    Healthy Father     Social History   Socioeconomic History   Marital status: Married    Spouse name: [name]   Number of children: Not on file   Years of education: Not on  file   Highest education level: Not on file  Occupational History   Not on file  Tobacco Use   Smoking status: Never   Smokeless tobacco: Never  Vaping Use   Vaping Use: Never used  Substance and Sexual Activity   Alcohol use: No   Drug use: No   Sexual activity: Not Currently  Other Topics Concern   Not on file  Social History Narrative   12/16/19 Lives with wife   Social Determinants of Health   Financial Resource Strain: Not on file  Food Insecurity: Not on file  Transportation Needs: Not on file  Physical Activity: Not on file  Stress: Not on file  Social Connections: Not on file  Intimate Partner Violence: Not on file    Outpatient Medications Prior to Visit  Medication Sig Dispense Refill   acetaminophen (TYLENOL) 325 MG tablet Take 2 tablets (650 mg total) by mouth every 4 (four) hours as needed for mild pain (or temp > 37.5 C (99.5 F)).     aspirin EC 81 MG tablet Take 1 tablet (81 mg total) by mouth daily. 100 tablet 3   cetirizine (ZYRTEC) 10 MG tablet Take 1 tablet (10 mg total) by mouth daily. 30 tablet 11   furosemide (LASIX) 20 MG tablet Take 1 tablet (20 mg total) by mouth daily. For two days then stop 10  tablet 0   promethazine-dextromethorphan (PROMETHAZINE-DM) 6.25-15 MG/5ML syrup Take 5 mLs by mouth 4 (four) times daily as needed for cough. 240 mL 1   amLODipine (NORVASC) 5 MG tablet Take 1 tablet (5 mg total) by mouth daily. To lower blood pressure 90 tablet 2   amoxicillin-clavulanate (AUGMENTIN) 875-125 MG tablet Take 1 tablet by mouth 2 (two) times daily. 14 tablet 0   atorvastatin (LIPITOR) 40 MG tablet One daily in the evening to lower cholesterol 90 tablet 2   donepezil (ARICEPT) 10 MG tablet Take 1 tablet (10 mg total) by mouth at bedtime. 90 tablet 1   metFORMIN (GLUCOPHAGE) 500 MG tablet Take 1 tablet (500 mg total) by mouth 2 (two) times daily with a meal. 180 tablet 2   QUEtiapine (SEROQUEL) 25 MG tablet Take 2 tablets (50 mg total) by mouth at  bedtime. 60 tablet 3   No facility-administered medications prior to visit.    No Known Allergies  ROS Review of Systems  Constitutional:  Negative for chills, diaphoresis and fever.  HENT:  Positive for trouble swallowing. Negative for congestion, hearing loss, nosebleeds, sore throat and tinnitus.   Eyes:  Negative for photophobia and redness.  Respiratory:  Positive for cough. Negative for shortness of breath, wheezing and stridor.   Cardiovascular:  Positive for leg swelling. Negative for chest pain and palpitations.  Gastrointestinal:  Negative for abdominal pain, blood in stool, constipation, diarrhea, nausea and vomiting.  Endocrine: Negative for polydipsia.  Genitourinary:  Negative for dysuria, flank pain, frequency, hematuria and urgency.  Musculoskeletal:  Negative for back pain, myalgias and neck pain.  Skin:  Negative for rash.  Allergic/Immunologic: Negative for environmental allergies.  Neurological:  Negative for dizziness, tremors, seizures, weakness and headaches.  Hematological:  Does not bruise/bleed easily.  Psychiatric/Behavioral:  Negative for suicidal ideas. The patient is not nervous/anxious.      Objective:    Physical Exam Vitals reviewed.  Constitutional:      Appearance: Normal appearance. He is well-developed. He is not diaphoretic.  HENT:     Head: Normocephalic and atraumatic.     Nose: No nasal deformity, septal deviation, mucosal edema or rhinorrhea.     Right Sinus: No maxillary sinus tenderness or frontal sinus tenderness.     Left Sinus: No maxillary sinus tenderness or frontal sinus tenderness.     Mouth/Throat:     Mouth: Mucous membranes are moist.     Pharynx: Oropharynx is clear. No oropharyngeal exudate.  Eyes:     General: No scleral icterus.    Conjunctiva/sclera: Conjunctivae normal.     Pupils: Pupils are equal, round, and reactive to light.  Neck:     Thyroid: No thyromegaly.     Vascular: No carotid bruit or JVD.      Trachea: Trachea normal. No tracheal tenderness or tracheal deviation.  Cardiovascular:     Rate and Rhythm: Normal rate and regular rhythm.     Chest Wall: PMI is not displaced.     Pulses: Normal pulses. No decreased pulses.     Heart sounds: Normal heart sounds, S1 normal and S2 normal. Heart sounds not distant. No murmur heard. No systolic murmur is present.  No diastolic murmur is present.    No friction rub. No gallop. No S3 or S4 sounds.  Pulmonary:     Effort: Pulmonary effort is normal. No tachypnea, accessory muscle usage or respiratory distress.     Breath sounds: Normal breath sounds. No stridor. No decreased breath sounds,  wheezing, rhonchi or rales.  Chest:     Chest wall: No tenderness.  Abdominal:     General: Bowel sounds are normal. There is no distension.     Palpations: Abdomen is soft. Abdomen is not rigid.     Tenderness: There is no abdominal tenderness. There is no guarding or rebound.  Musculoskeletal:        General: No swelling. Normal range of motion.     Cervical back: Normal range of motion and neck supple. No edema, erythema or rigidity. No muscular tenderness. Normal range of motion.     Right lower leg: No edema.     Left lower leg: No edema.  Lymphadenopathy:     Head:     Right side of head: No submental or submandibular adenopathy.     Left side of head: No submental or submandibular adenopathy.     Cervical: No cervical adenopathy.  Skin:    General: Skin is warm and dry.     Coloration: Skin is not pale.     Findings: No rash.     Nails: There is no clubbing.  Neurological:     Mental Status: He is alert and oriented to person, place, and time.     Sensory: No sensory deficit.  Psychiatric:        Speech: Speech normal.        Behavior: Behavior normal.    BP (!) 144/85   Pulse 69   Resp 16   Wt 190 lb 3.2 oz (86.3 kg)   SpO2 98%   BMI 25.80 kg/m  Wt Readings from Last 3 Encounters:  08/15/21 190 lb 3.2 oz (86.3 kg)  05/16/21 176  lb 12.8 oz (80.2 kg)  03/14/21 168 lb (76.2 kg)     Health Maintenance Due  Topic Date Due   Zoster Vaccines- Shingrix (1 of 2) Never done   COVID-19 Vaccine (4 - Booster for Pfizer series) 08/20/2020   URINE MICROALBUMIN  08/04/2021    There are no preventive care reminders to display for this patient.  Lab Results  Component Value Date   TSH 0.178 (L) 02/15/2021   Lab Results  Component Value Date   WBC 5.3 02/17/2021   HGB 12.3 (L) 02/17/2021   HCT 37.5 (L) 02/17/2021   MCV 93.1 02/17/2021   PLT 188 02/17/2021   Lab Results  Component Value Date   NA 140 02/17/2021   K 3.5 02/17/2021   CO2 29 02/17/2021   GLUCOSE 130 (H) 02/17/2021   BUN 9 02/17/2021   CREATININE 0.68 02/17/2021   BILITOT 1.3 (H) 02/15/2021   ALKPHOS 49 02/15/2021   AST 31 02/15/2021   ALT 25 02/15/2021   PROT 6.4 (L) 02/15/2021   ALBUMIN 3.4 (L) 02/15/2021   CALCIUM 9.2 02/17/2021   ANIONGAP 8 02/17/2021   EGFR 95 02/15/2021   Lab Results  Component Value Date   CHOL 159 03/11/2019   Lab Results  Component Value Date   HDL 45 03/11/2019   Lab Results  Component Value Date   LDLCALC 101 (H) 03/11/2019   Lab Results  Component Value Date   TRIG 67 03/11/2019   Lab Results  Component Value Date   CHOLHDL 3.5 03/11/2019   Lab Results  Component Value Date   HGBA1C 5.9 (H) 02/16/2021      Assessment & Plan:   Problem List Items Addressed This Visit       Cardiovascular and Mediastinum   Dysphagia due to  recent stroke (Chronic)    Difficulty swallowing leading to recurrent aspiration no evidence of active infection at this time we will continue to monitor      Relevant Medications   amLODipine (NORVASC) 5 MG tablet   atorvastatin (LIPITOR) 40 MG tablet   Benign essential HTN (Chronic)    Blood pressure slightly elevated will monitor for now no change in medicines  Edema has resolved we will use Lasix only as needed        Relevant Medications   amLODipine  (NORVASC) 5 MG tablet   atorvastatin (LIPITOR) 40 MG tablet     Endocrine   Controlled type 2 diabetes mellitus with hyperglycemia, without long-term current use of insulin ([name]) - Primary (Chronic)    Diabetes controlled at this time no change in medications      Relevant Medications   atorvastatin (LIPITOR) 40 MG tablet   metFORMIN (GLUCOPHAGE) 500 MG tablet   Other Relevant Orders   Microalbumin / creatinine urine ratio   POCT glucose (manual entry) (Completed)   Basic Metabolic Panel     Nervous and Auditory   Cognitive deficit, post-stroke (Chronic)   Relevant Medications   donepezil (ARICEPT) 10 MG tablet   QUEtiapine (SEROQUEL) 25 MG tablet     Other   Dyslipidemia, goal LDL below 70    Continue current statin      Relevant Medications   amLODipine (NORVASC) 5 MG tablet   atorvastatin (LIPITOR) 40 MG tablet   Other Visit Diagnoses     Essential hypertension       Relevant Medications   amLODipine (NORVASC) 5 MG tablet   atorvastatin (LIPITOR) 40 MG tablet   Other Relevant Orders   Basic Metabolic Panel   History of CVA with residual deficit       Relevant Medications   atorvastatin (LIPITOR) 40 MG tablet   Type 2 diabetes mellitus with other circulatory complication, without long-term current use of insulin ([name])       Relevant Medications   amLODipine (NORVASC) 5 MG tablet   atorvastatin (LIPITOR) 40 MG tablet   metFORMIN (GLUCOPHAGE) 500 MG tablet   Need for Streptococcus pneumoniae vaccination       Relevant Orders   Pneumococcal conjugate vaccine 20-valent (Completed)       Meds ordered this encounter  Medications   amLODipine (NORVASC) 5 MG tablet    Sig: Take 1 tablet (5 mg total) by mouth daily. To lower blood pressure    Dispense:  90 tablet    Refill:  2   atorvastatin (LIPITOR) 40 MG tablet    Sig: One daily in the evening to lower cholesterol    Dispense:  90 tablet    Refill:  2   donepezil (ARICEPT) 10 MG tablet    Sig: Take 1  tablet (10 mg total) by mouth at bedtime.    Dispense:  90 tablet    Refill:  1   metFORMIN (GLUCOPHAGE) 500 MG tablet    Sig: Take 1 tablet (500 mg total) by mouth 2 (two) times daily with a meal.    Dispense:  180 tablet    Refill:  2   QUEtiapine (SEROQUEL) 25 MG tablet    Sig: Take 2 tablets (50 mg total) by mouth at bedtime.    Dispense:  60 tablet    Refill:  3   azelastine (ASTELIN) 0.1 % nasal spray    Sig: Place 2 sprays into both nostrils 2 (two) times daily. Use in  each nostril as directed    Dispense:  30 mL    Refill:  12  Obtain urine for microalbumin  Follow-up: Return in about 3 months (around 11/13/2021).    Asencion Noble, MD

## 2021-08-15 ENCOUNTER — Ambulatory Visit: Payer: Medicare Other | Attending: Critical Care Medicine | Admitting: Critical Care Medicine

## 2021-08-15 ENCOUNTER — Encounter: Payer: Self-pay | Admitting: Critical Care Medicine

## 2021-08-15 ENCOUNTER — Other Ambulatory Visit: Payer: Self-pay

## 2021-08-15 VITALS — BP 144/85 | HR 69 | Resp 16 | Wt 190.2 lb

## 2021-08-15 DIAGNOSIS — I6930 Unspecified sequelae of cerebral infarction: Secondary | ICD-10-CM | POA: Diagnosis not present

## 2021-08-15 DIAGNOSIS — E785 Hyperlipidemia, unspecified: Secondary | ICD-10-CM | POA: Diagnosis not present

## 2021-08-15 DIAGNOSIS — Z23 Encounter for immunization: Secondary | ICD-10-CM | POA: Diagnosis not present

## 2021-08-15 DIAGNOSIS — I69319 Unspecified symptoms and signs involving cognitive functions following cerebral infarction: Secondary | ICD-10-CM

## 2021-08-15 DIAGNOSIS — E1165 Type 2 diabetes mellitus with hyperglycemia: Secondary | ICD-10-CM | POA: Diagnosis not present

## 2021-08-15 DIAGNOSIS — E1159 Type 2 diabetes mellitus with other circulatory complications: Secondary | ICD-10-CM

## 2021-08-15 DIAGNOSIS — I10 Essential (primary) hypertension: Secondary | ICD-10-CM

## 2021-08-15 DIAGNOSIS — I69391 Dysphagia following cerebral infarction: Secondary | ICD-10-CM

## 2021-08-15 LAB — GLUCOSE, POCT (MANUAL RESULT ENTRY): POC Glucose: 153 mg/dl — AB (ref 70–99)

## 2021-08-15 MED ORDER — AMLODIPINE BESYLATE 5 MG PO TABS: 5.0000 mg | ORAL_TABLET | Freq: Every day | ORAL | 2 refills | Status: DC

## 2021-08-15 MED ORDER — ATORVASTATIN CALCIUM 40 MG PO TABS: ORAL_TABLET | ORAL | 2 refills | Status: DC

## 2021-08-15 MED ORDER — DONEPEZIL HCL 10 MG PO TABS: 10.0000 mg | ORAL_TABLET | Freq: Every day | ORAL | 1 refills | Status: DC

## 2021-08-15 MED ORDER — QUETIAPINE FUMARATE 25 MG PO TABS: 50.0000 mg | ORAL_TABLET | Freq: Every day | ORAL | 3 refills | Status: DC

## 2021-08-15 MED ORDER — METFORMIN HCL 500 MG PO TABS
500.0000 mg | ORAL_TABLET | Freq: Two times a day (BID) | ORAL | 2 refills | Status: DC
Start: 2021-08-15 — End: 2021-11-14

## 2021-08-15 MED ORDER — AZELASTINE HCL 0.1 % NA SOLN: 2.0000 | Freq: Two times a day (BID) | NASAL | 12 refills | Status: DC

## 2021-08-15 NOTE — Assessment & Plan Note (Signed)
Diabetes controlled at this time no change in medications

## 2021-08-15 NOTE — Assessment & Plan Note (Signed)
Difficulty swallowing leading to recurrent aspiration no evidence of active infection at this time we will continue to monitor

## 2021-08-15 NOTE — Assessment & Plan Note (Addendum)
Blood pressure slightly elevated will monitor for now no change in medicines  Edema has resolved we will use Lasix only as needed

## 2021-08-15 NOTE — Assessment & Plan Note (Signed)
Continue current statin

## 2021-08-15 NOTE — Patient Instructions (Signed)
Pneumonia vaccine was given  Refills on medications sent to your [facility] today include metabolic profile and a urine sample for protein  Return to see Dr. Joya Gaskins 3 months

## 2021-08-16 ENCOUNTER — Telehealth: Payer: Self-pay

## 2021-08-16 LAB — MICROALBUMIN / CREATININE URINE RATIO
Creatinine, Urine: 86.3 mg/dL
Microalb/Creat Ratio: 48 mg/g creat — ABNORMAL HIGH (ref 0–29)
Microalbumin, Urine: 41.5 ug/mL

## 2021-08-16 LAB — BASIC METABOLIC PANEL
BUN/Creatinine Ratio: 18 (ref 10–24)
BUN: 17 mg/dL (ref 8–27)
CO2: 24 mmol/L (ref 20–29)
Calcium: 10.1 mg/dL (ref 8.6–10.2)
Chloride: 101 mmol/L (ref 96–106)
Creatinine, Ser: 0.92 mg/dL (ref 0.76–1.27)
Glucose: 141 mg/dL — ABNORMAL HIGH (ref 70–99)
Potassium: 4.6 mmol/L (ref 3.5–5.2)
Sodium: 142 mmol/L (ref 134–144)
eGFR: 91 mL/min/{1.73_m2} (ref >59)

## 2021-08-16 NOTE — Telephone Encounter (Signed)
Pt's wife was called and is aware of results, DOB was confirmed.

## 2021-08-16 NOTE — Telephone Encounter (Signed)
-----   Message from Elsie Stain, MD sent at 08/16/2021  1:44 PM EST ----- Let pt know urine protein is mild , kidney is normal,

## 2021-10-17 ENCOUNTER — Ambulatory Visit: Payer: Self-pay | Admitting: *Deleted

## 2021-10-17 NOTE — Telephone Encounter (Signed)
Summary: cough   Pts wife called in stating the pt has a bad cough, and wanted to speak with a nurse, please advise.     Attempted to call patient's wife back at home number- no answer- left message to call office.

## 2021-10-17 NOTE — Telephone Encounter (Signed)
°  Chief Complaint: cough Symptoms: cough- productive- off/on Frequency: over 1 week Pertinent Negatives: Patient denies any other symptoms Disposition: [] ED /[x] Urgent Care (no appt availability in office) / [] Appointment(In office/virtual)/ []  [facility]/ [] Home Care/ [] Refused Recommended Disposition /[] [facility]/ []  Follow-up with PCP Additional Notes: Wife is requesting appointment for husband's cough- no open appointment- advised mobile unit/UC  Reason for Disposition  [1] Continuous (nonstop) coughing interferes with work or school AND [2] no improvement using cough treatment per Care Advice  Answer Assessment - Initial Assessment Questions 1. ONSET: "When did the cough begin?"      Started last week 2. SEVERITY: "How bad is the cough today?"      Off/on 3. SPUTUM: "Describe the color of your sputum" (none, dry cough; clear, white, yellow, green)     White/clear 4. HEMOPTYSIS: "Are you coughing up any blood?" If so ask: "How much?" (flecks, streaks, tablespoons, etc.)     no 5. DIFFICULTY BREATHING: "Are you having difficulty breathing?" If Yes, ask: "How bad is it?" (e.g., mild, moderate, severe)    - MILD: No SOB at rest, mild SOB with walking, speaks normally in sentences, can lie down, no retractions, pulse < 100.    - MODERATE: SOB at rest, SOB with minimal exertion and prefers to sit, cannot lie down flat, speaks in phrases, mild retractions, audible wheezing, pulse 100-120.    - SEVERE: Very SOB at rest, speaks in single words, struggling to breathe, sitting hunched forward, retractions, pulse > 120      normal 6. FEVER: "Do you have a fever?" If Yes, ask: "What is your temperature, how was it measured, and when did it start?"     no 7. CARDIAC HISTORY: "Do you have any history of heart disease?" (e.g., heart attack, congestive heart failure)      *No Answer* 8. LUNG HISTORY: "Do you have any history of lung disease?"  (e.g., pulmonary embolus,  asthma, emphysema)     *No Answer* 9. PE RISK FACTORS: "Do you have a history of blood clots?" (or: recent major surgery, recent prolonged travel, bedridden)     *No Answer* 10. OTHER SYMPTOMS: "Do you have any other symptoms?" (e.g., runny nose, wheezing, chest pain)       no 11. PREGNANCY: "Is there any chance you are pregnant?" "When was your last menstrual period?"       *No Answer* 12. TRAVEL: "Have you traveled out of the country in the last month?" (e.g., travel history, exposures)       *No Answer*  Protocols used: Cough - Acute Productive-A-AH

## 2021-10-18 NOTE — Telephone Encounter (Signed)
fyi

## 2021-10-18 NOTE — Telephone Encounter (Signed)
Keep in mind the mobile unit is working at [employer] today I do not know if they can have time to see him today maybe tomorrow

## 2021-10-20 ENCOUNTER — Encounter: Payer: Self-pay | Admitting: Emergency Medicine

## 2021-10-20 ENCOUNTER — Other Ambulatory Visit: Payer: Self-pay

## 2021-10-20 ENCOUNTER — Ambulatory Visit
Admission: EM | Admit: 2021-10-20 | Discharge: 2021-10-20 | Disposition: A | Discharge status: Home / Self Care | Payer: Medicare Other | Attending: Internal Medicine | Admitting: Internal Medicine

## 2021-10-20 ENCOUNTER — Ambulatory Visit (INDEPENDENT_AMBULATORY_CARE_PROVIDER_SITE_OTHER): Payer: Medicare Other

## 2021-10-20 DIAGNOSIS — R053 Chronic cough: Secondary | ICD-10-CM

## 2021-10-20 DIAGNOSIS — R059 Cough, unspecified: Secondary | ICD-10-CM

## 2021-10-20 MED ORDER — BENZONATATE 100 MG PO CAPS: 100.0000 mg | ORAL_CAPSULE | Freq: Three times a day (TID) | ORAL | 0 refills | Status: DC | PRN

## 2021-10-20 NOTE — ED Provider Notes (Signed)
[facility]    CSN: [id number] Arrival date & time: 10/20/21  1315      History   Chief Complaint Chief Complaint  Patient presents with   Cough    HPI Joe SCHOR Sr. is a 71 y.o. male.   Patient presents with a nonproductive, dry cough that has been present for approximately 2 to 3 years.  Wife is at bedside and is patient's historian due to patient having a history of stroke and having altered mental status at baseline.  Wife reports that cough has been present since patient had a stroke in 2020.  She denies any known fevers, shortness of breath, chest pain, upper respiratory symptoms, peripheral edema.  Patient has not been evaluated for cough since it started.   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   Benign essential HTN    Cognitive deficit, post-stroke    Controlled type 2 diabetes mellitus with hyperglycemia, without long-term current use of insulin ([name])    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   Dyslipidemia, goal LDL below 70 03/12/2019   Dysphagia due to recent stroke 03/12/2019   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, d/t large vessel dz 03/10/2019   Occlusion of right posterior communicating artery 03/10/2019   Gait disturbance, post-stroke    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    Past Surgical History:  Procedure Laterality Date   IR ANGIO INTRA EXTRACRAN SEL COM CAROTID INNOMINATE UNI L MOD SED  03/10/2019   IR ANGIO  VERTEBRAL SEL SUBCLAVIAN INNOMINATE BILAT MOD SED  03/10/2019   IR CT HEAD LTD  03/10/2019   IR PERCUTANEOUS ART THROMBECTOMY/INFUSION INTRACRANIAL INC DIAG ANGIO  03/10/2019   RADIOLOGY WITH ANESTHESIA N/A 03/10/2019   Procedure: IR WITH ANESTHESIA/CODE STROKE;  Surgeon: Radiologist, Medication, MD;  Location: [place];  Service: Radiology;  Laterality: N/A;       Home Medications    Prior to Admission medications   Medication Sig Start Date End Date Taking? Authorizing Provider  amLODipine (NORVASC) 5 MG tablet Take 1 tablet (5 mg total) by mouth daily. To lower blood pressure 08/15/21  Yes Elsie Stain, MD  aspirin EC 81 MG tablet Take 1 tablet (81 mg total) by mouth daily. 07/26/20  Yes Elsie Stain, MD  atorvastatin (LIPITOR) 40 MG tablet One daily in the evening to lower cholesterol 08/15/21  Yes Elsie Stain, MD  benzonatate (TESSALON) 100 MG capsule Take 1 capsule (100 mg total) by mouth every 8 (eight) hours as needed for cough. 10/20/21  Yes , Hildred Alamin E, FNP  donepezil (ARICEPT) 10 MG tablet Take 1 tablet (10 mg total) by mouth at bedtime. 08/15/21  Yes Elsie Stain, MD  metFORMIN (GLUCOPHAGE) 500 MG tablet Take 1 tablet (500 mg total) by mouth 2 (two) times daily with a meal. 08/15/21  Yes Elsie Stain, MD  QUEtiapine (SEROQUEL) 25 MG tablet Take 2 tablets (50 mg total) by mouth at bedtime. 08/15/21  Yes  Elsie Stain, MD  acetaminophen (TYLENOL) 325 MG tablet Take 2 tablets (650 mg total) by mouth every 4 (four) hours as needed for mild pain (or temp > 37.5 C (99.5 F)). 06/19/19   Angiulli, Lavon Paganini, PA-C  azelastine (ASTELIN) 0.1 % nasal spray Place 2 sprays into both nostrils 2 (two) times daily. Use in each nostril as directed 08/15/21   Elsie Stain, MD  cetirizine (ZYRTEC) 10 MG tablet Take 1 tablet (10 mg total) by mouth daily. 02/15/21   Mayers, Cari S, PA-C  furosemide (LASIX) 20 MG tablet Take 1 tablet (20 mg total) by mouth daily. For two days  then stop 08/01/21   Elsie Stain, MD  promethazine-dextromethorphan (PROMETHAZINE-DM) 6.25-15 MG/5ML syrup Take 5 mLs by mouth 4 (four) times daily as needed for cough. 05/16/21   Elsie Stain, MD    Family History Family History  Problem Relation Age of Onset   Diabetes Other    Hypertension Other    Healthy Mother    Healthy Father     Social History Social History   Tobacco Use   Smoking status: Never   Smokeless tobacco: Never  Vaping Use   Vaping Use: Never used  Substance Use Topics   Alcohol use: No   Drug use: No     Allergies   Patient has no known allergies.   Review of Systems Review of Systems Per HPI  Physical Exam Triage Vital Signs ED Triage Vitals [10/20/21 1356]  Enc Vitals Group     BP (!) 161/58     Pulse Rate 73     Resp 16     Temp 98.1 F (36.7 C)     Temp Source Oral     SpO2 96 %     Weight      Height      Head Circumference      Peak Flow      Pain Score 0     Pain Loc      Pain Edu?      Excl. in [place]?    No data found.  Updated Vital Signs BP (!) 161/58 (BP Location: Left Arm)    Pulse 73    Temp 98.1 F (36.7 C) (Oral)    Resp 16    SpO2 96%   Visual Acuity Right Eye Distance:   Left Eye Distance:   Bilateral Distance:    Right Eye Near:   Left Eye Near:    Bilateral Near:     Physical Exam Constitutional:      General: He is not in acute distress.    Appearance: Normal appearance. He is not toxic-appearing or diaphoretic.  HENT:     Head: Normocephalic and atraumatic.  Eyes:     Extraocular Movements: Extraocular movements intact.     Conjunctiva/sclera: Conjunctivae normal.  Cardiovascular:     Rate and Rhythm: Normal rate and regular rhythm.     Pulses: Normal pulses.     Heart sounds: Normal heart sounds.  Pulmonary:     Effort: Pulmonary effort is normal. No respiratory distress.     Breath sounds: Normal breath sounds. No stridor. No wheezing, rhonchi or rales.  Neurological:     General:  No focal deficit present.     Mental Status: He is alert and oriented to person, place, and time. Mental status is at baseline.  Psychiatric:        Mood and Affect: Mood normal.  Behavior: Behavior normal.        Thought Content: Thought content normal.        Judgment: Judgment normal.     UC Treatments / Results  Labs (all labs ordered are listed, but only abnormal results are displayed) Labs Reviewed - No data to display  EKG   Radiology DG Chest 2 View  Result Date: 10/20/2021 CLINICAL DATA:  Cough. EXAM: CHEST - 2 VIEW COMPARISON:  February 15, 2021. FINDINGS: The heart size and mediastinal contours are within normal limits. Both lungs are clear. The visualized skeletal structures are unremarkable. IMPRESSION: No active cardiopulmonary disease. Electronically Signed   By: Marijo Conception M.D.   On: 10/20/2021 14:53    Procedures Procedures (including critical care time)  Medications Ordered in UC Medications - No data to display  Initial Impression / Assessment and Plan / UC Course  I have reviewed the triage vital signs and the nursing notes.  Pertinent labs & imaging results that were available during my care of the patient were reviewed by me and considered in my medical decision making (see chart for details).     Chest x-ray was negative for any acute cardiopulmonary process.  Cough medication prescribed to take as needed.  Unsure etiology of patient's cough.  Patient will need close follow-up with PCP and possibly a pulmonologist.  Provided patient with contact information for pulmonology.  Discussed return precautions.  Patient's wife voiced understanding and was agreeable with plan. Final Clinical Impressions(s) / UC Diagnoses   Final diagnoses:  Chronic cough     Discharge Instructions      Chest x-ray was normal.  You will need to follow-up with pulmonology for further evaluation and management.     ED Prescriptions     Medication Sig Dispense  Auth. Provider   benzonatate (TESSALON) 100 MG capsule Take 1 capsule (100 mg total) by mouth every 8 (eight) hours as needed for cough. 21 capsule [place], Michele Rockers, [place]      PDMP not reviewed this encounter.   Teodora Medici, [place] 10/20/21 [phone]

## 2021-10-20 NOTE — Discharge Instructions (Addendum)
Chest x-ray was normal.  You will need to follow-up with pulmonology for further evaluation and management.

## 2021-10-20 NOTE — Telephone Encounter (Signed)
(  I got help from [place] with scheduling) Talked with pts wife and the had already went to [facility]

## 2021-10-20 NOTE — ED Triage Notes (Signed)
Wife reports cough that has been there since his stroke in 2020, but says it got worse this year in January. Says the PCP told her over the phone to go be evaluated for cough at Urgent Care. Wife reports he has had intermittent confusion since his stroke in 2020, and that he appears at baseline in triage.

## 2021-10-22 ENCOUNTER — Other Ambulatory Visit: Payer: Self-pay | Admitting: Critical Care Medicine

## 2021-10-22 DIAGNOSIS — I10 Essential (primary) hypertension: Secondary | ICD-10-CM

## 2021-10-22 DIAGNOSIS — E785 Hyperlipidemia, unspecified: Secondary | ICD-10-CM

## 2021-10-22 DIAGNOSIS — I6930 Unspecified sequelae of cerebral infarction: Secondary | ICD-10-CM

## 2021-10-24 NOTE — Telephone Encounter (Signed)
Requested medication (s) are due for refill today: yes  Requested medication (s) are on the active medication list: yes  Last refill:  08/15/21 #90 2 refills  Future visit scheduled: yes in 3 weeks  Notes to clinic:  last lipid panel 03/11/2019. Do you want to refill for 3 months?      Requested Prescriptions  Pending Prescriptions Disp Refills   atorvastatin (LIPITOR) 40 MG tablet [Pharmacy Med Name: Atorvastatin Calcium 40 MG Oral Tablet] 90 tablet 0    Sig: TAKE 1 TABLET BY MOUTH IN THE EVENING TO LOWER CHOLESTEROL.     Cardiovascular:  Antilipid - Statins Failed - 10/22/2021  4:17 PM      Failed - Lipid Panel in normal range within the last 12 months    Cholesterol  Date Value Ref Range Status  03/11/2019 159 0 - 200 mg/dL Final   LDL Cholesterol  Date Value Ref Range Status  03/11/2019 101 (H) 0 - 99 mg/dL Final    Comment:           Total Cholesterol/HDL:CHD Risk Coronary Heart Disease Risk Table                     Men   Women  1/2 Average Risk   3.4   3.3  Average Risk       5.0   4.4  2 X Average Risk   9.6   7.1  3 X Average Risk  23.4   11.0        Use the calculated Patient Ratio above and the CHD Risk Table to determine the patient's CHD Risk.        ATP III CLASSIFICATION (LDL):  <100     mg/dL   Optimal  100-129  mg/dL   Near or Above                    Optimal  130-159  mg/dL   Borderline  160-189  mg/dL   High  >190     mg/dL   Very High Performed at [facility] [address], [place], [place] [address]    HDL  Date Value Ref Range Status  03/11/2019 45 >40 mg/dL Final   Triglycerides  Date Value Ref Range Status  03/11/2019 67 <150 mg/dL Final         Passed - Patient is not pregnant      Passed - Valid encounter within last 12 months    Recent Outpatient Visits           2 months ago Controlled type 2 diabetes mellitus with hyperglycemia, without long-term current use of insulin ([place])   [place] Elsie Stain, MD   5 months ago Essential hypertension   [place] Elsie Stain, MD   7 months ago Benign essential HTN   [place], MD   8 months ago Dysphagia due to recent stroke   [place], MD   10 months ago Controlled type 2 diabetes mellitus with hyperglycemia, without long-term current use of insulin [name])   [place], MD       Future Appointments             In 3 weeks Elsie Stain, MD [place]

## 2021-11-13 NOTE — Progress Notes (Signed)
Established Patient Office Visit  Subjective:  Patient ID: Joe Perry., male    DOB: 12-04-1950  Age: 71 y.o. MRN: [id number]  CC:  Chief Complaint  Patient presents with   Diabetes    3 month follow up    HPI [place]. presents for hypertension and follow-up.  Patient is accompanied by his spouse.  On arrival blood pressure remains elevated 147/88.  On recheck it is in the same range.  He has history of dysphagia due to recent stroke.  He still coughs when he eats.  Patient maintains low-dose amlodipine.  Patient does main taint atorvastatin and aspirin.  He is now off the Plavix.  Patient also maintaining Aricept for chronic memory loss.  He is fully vaccinated.  On arrival A1c is 6.8.  He maintains the metformin.  There are no other complaints at this time.  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 ([place]) 06/03/2019   Cerebrovascular accident (CVA) due to occlusion of vertebral artery ([name])    Diabetes mellitus without complication ([place])    Dysphagia    post stroke   Hypertension    Left upper lobe pneumonia 02/15/2021   Stroke [facility]     Past Surgical History:  Procedure Laterality Date   IR ANGIO INTRA EXTRACRAN SEL COM CAROTID INNOMINATE UNI L MOD SED  03/10/2019   IR ANGIO VERTEBRAL SEL SUBCLAVIAN INNOMINATE BILAT MOD SED  03/10/2019   IR CT HEAD LTD  03/10/2019   IR PERCUTANEOUS ART THROMBECTOMY/INFUSION INTRACRANIAL INC DIAG ANGIO  03/10/2019   RADIOLOGY WITH ANESTHESIA N/A 03/10/2019   Procedure: IR WITH ANESTHESIA/CODE STROKE;  Surgeon: Radiologist, Medication, MD;  Location: [place];  Service: Radiology;  Laterality: N/A;    Family History  Problem Relation Age of Onset   Diabetes Other    Hypertension Other    Healthy Mother    Healthy Father     Social History   Socioeconomic History   Marital status: Married    Spouse  name: [name]   Number of children: Not on file   Years of education: Not on file   Highest education level: Not on file  Occupational History   Not on file  Tobacco Use   Smoking status: Never   Smokeless tobacco: Never  Vaping Use   Vaping Use: Never used  Substance and Sexual Activity   Alcohol use: No   Drug use: No   Sexual activity: Not Currently  Other Topics Concern   Not on file  Social History Narrative   12/16/19 Lives with wife   Social Determinants of Health   Financial Resource Strain: Not on file  Food Insecurity: Not on file  Transportation Needs: Not on file  Physical Activity: Not on file  Stress: Not on file  Social Connections: Not on file  Intimate Partner Violence: Not on file    Outpatient Medications Prior to Visit  Medication Sig Dispense Refill   acetaminophen (TYLENOL) 325 MG tablet Take 2 tablets (650 mg total) by mouth every 4 (four) hours as needed for mild pain (or temp > 37.5 C (99.5 F)).     aspirin EC 81 MG tablet Take 1 tablet (81 mg total) by mouth daily. 100 tablet 3   amLODipine (NORVASC) 5 MG tablet Take 1 tablet (5 mg total) by mouth daily. To lower blood pressure 90 tablet 2  atorvastatin (LIPITOR) 40 MG tablet TAKE 1 TABLET BY MOUTH IN THE EVENING TO LOWER CHOLESTEROL. 90 tablet 0   cetirizine (ZYRTEC) 10 MG tablet Take 1 tablet (10 mg total) by mouth daily. 30 tablet 11   clopidogrel (PLAVIX) 75 MG tablet 1 tab(s)     donepezil (ARICEPT) 10 MG tablet Take 1 tablet (10 mg total) by mouth at bedtime. 90 tablet 1   metFORMIN (GLUCOPHAGE) 500 MG tablet Take 1 tablet (500 mg total) by mouth 2 (two) times daily with a meal. 180 tablet 2   QUEtiapine (SEROQUEL) 25 MG tablet Take 2 tablets (50 mg total) by mouth at bedtime. 60 tablet 3   azelastine (ASTELIN) 0.1 % nasal spray Place 2 sprays into both nostrils 2 (two) times daily. Use in each nostril as directed (Patient not taking: Reported on 11/14/2021) 30 mL 12   benzonatate (TESSALON) 100  MG capsule Take 1 capsule (100 mg total) by mouth every 8 (eight) hours as needed for cough. (Patient not taking: Reported on 11/14/2021) 21 capsule 0   furosemide (LASIX) 20 MG tablet Take 1 tablet (20 mg total) by mouth daily. For two days then stop (Patient not taking: Reported on 11/14/2021) 10 tablet 0   promethazine-dextromethorphan (PROMETHAZINE-DM) 6.25-15 MG/5ML syrup Take 5 mLs by mouth 4 (four) times daily as needed for cough. (Patient not taking: Reported on 11/14/2021) 240 mL 1   No facility-administered medications prior to visit.    No Known Allergies  ROS Review of Systems  Constitutional: Negative.   HENT: Negative.  Negative for ear pain, postnasal drip, rhinorrhea, sinus pressure, sore throat, trouble swallowing and voice change.   Eyes: Negative.   Respiratory:  Positive for cough. Negative for apnea, choking, chest tightness, shortness of breath, wheezing and stridor.   Cardiovascular: Negative.  Negative for chest pain, palpitations and leg swelling.  Gastrointestinal: Negative.  Negative for abdominal distention, abdominal pain, nausea and vomiting.  Genitourinary: Negative.   Musculoskeletal: Negative.  Negative for arthralgias and myalgias.  Skin: Negative.  Negative for rash.  Allergic/Immunologic: Negative.  Negative for environmental allergies and food allergies.  Neurological: Negative.  Negative for dizziness, syncope, weakness and headaches.  Hematological: Negative.  Negative for adenopathy. Does not bruise/bleed easily.  Psychiatric/Behavioral: Negative.  Negative for agitation and sleep disturbance. The patient is not nervous/anxious.      Objective:    Physical Exam Vitals reviewed.  Constitutional:      Appearance: Normal appearance. He is well-developed. He is obese. He is not diaphoretic.  HENT:     Head: Normocephalic and atraumatic.     Nose: No nasal deformity, septal deviation, mucosal edema or rhinorrhea.     Right Sinus: No maxillary sinus  tenderness or frontal sinus tenderness.     Left Sinus: No maxillary sinus tenderness or frontal sinus tenderness.     Mouth/Throat:     Pharynx: No oropharyngeal exudate.  Eyes:     General: No scleral icterus.    Conjunctiva/sclera: Conjunctivae normal.     Pupils: Pupils are equal, round, and reactive to light.  Neck:     Thyroid: No thyromegaly.     Vascular: No carotid bruit or JVD.     Trachea: Trachea normal. No tracheal tenderness or tracheal deviation.  Cardiovascular:     Rate and Rhythm: Normal rate and regular rhythm.     Chest Wall: PMI is not displaced.     Pulses: Normal pulses. No decreased pulses.     Heart sounds: Normal  heart sounds, S1 normal and S2 normal. Heart sounds not distant. No murmur heard. No systolic murmur is present.  No diastolic murmur is present.    No friction rub. No gallop. No S3 or S4 sounds.  Pulmonary:     Effort: Pulmonary effort is normal. No tachypnea, accessory muscle usage or respiratory distress.     Breath sounds: Normal breath sounds. No stridor. No decreased breath sounds, wheezing, rhonchi or rales.  Chest:     Chest wall: No tenderness.  Abdominal:     General: Bowel sounds are normal. There is no distension.     Palpations: Abdomen is soft. Abdomen is not rigid.     Tenderness: There is no abdominal tenderness. There is no guarding or rebound.  Musculoskeletal:        General: Normal range of motion.     Cervical back: Normal range of motion and neck supple. No edema, erythema or rigidity. No muscular tenderness. Normal range of motion.  Lymphadenopathy:     Head:     Right side of head: No submental or submandibular adenopathy.     Left side of head: No submental or submandibular adenopathy.     Cervical: No cervical adenopathy.  Skin:    General: Skin is warm and dry.     Coloration: Skin is not pale.     Findings: No rash.     Nails: There is no clubbing.  Neurological:     Mental Status: He is alert and oriented to  person, place, and time. Mental status is at baseline.     Sensory: No sensory deficit.  Psychiatric:        Speech: Speech normal.        Behavior: Behavior normal.    BP (!) 141/88    Pulse 75    Temp 98.1 F (36.7 C)    Resp 16    Ht 6' (1.829 m)    Wt 191 lb 3.2 oz (86.7 kg)    SpO2 100%    BMI 25.93 kg/m  Wt Readings from Last 3 Encounters:  11/14/21 191 lb 3.2 oz (86.7 kg)  08/15/21 190 lb 3.2 oz (86.3 kg)  05/16/21 176 lb 12.8 oz (80.2 kg)     Health Maintenance Due  Topic Date Due   COVID-19 Vaccine (4 - Booster for Pfizer series) 08/20/2020    There are no preventive care reminders to display for this patient.  Lab Results  Component Value Date   TSH 0.178 (L) 02/15/2021   Lab Results  Component Value Date   WBC 5.3 02/17/2021   HGB 12.3 (L) 02/17/2021   HCT 37.5 (L) 02/17/2021   MCV 93.1 02/17/2021   PLT 188 02/17/2021   Lab Results  Component Value Date   NA 142 08/15/2021   K 4.6 08/15/2021   CO2 24 08/15/2021   GLUCOSE 141 (H) 08/15/2021   BUN 17 08/15/2021   CREATININE 0.92 08/15/2021   BILITOT 1.3 (H) 02/15/2021   ALKPHOS 49 02/15/2021   AST 31 02/15/2021   ALT 25 02/15/2021   PROT 6.4 (L) 02/15/2021   ALBUMIN 3.4 (L) 02/15/2021   CALCIUM 10.1 08/15/2021   ANIONGAP 8 02/17/2021   EGFR 91 08/15/2021   Lab Results  Component Value Date   CHOL 159 03/11/2019   Lab Results  Component Value Date   HDL 45 03/11/2019   Lab Results  Component Value Date   LDLCALC 101 (H) 03/11/2019   Lab Results  Component Value  Date   TRIG 67 03/11/2019   Lab Results  Component Value Date   CHOLHDL 3.5 03/11/2019   Lab Results  Component Value Date   HGBA1C 6.8 (A) 11/14/2021   HGBA1C 6.8 11/14/2021      Assessment & Plan:   Problem List Items Addressed This Visit       Cardiovascular and Mediastinum   Benign essential HTN (Chronic)    Blood pressure not yet at goal we will increase amlodipine to 10 mg daily      Relevant Medications    amLODipine (NORVASC) 10 MG tablet   atorvastatin (LIPITOR) 40 MG tablet   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, d/t large vessel dz    History of stroke with mechanical thrombectomy large vessel in 2020  The patient's been off Plavix he is to maintain aspirin and atorvastatin for life       Relevant Medications   amLODipine (NORVASC) 10 MG tablet   atorvastatin (LIPITOR) 40 MG tablet   Occlusion of right posterior communicating artery    As per stroke      Relevant Medications   amLODipine (NORVASC) 10 MG tablet   atorvastatin (LIPITOR) 40 MG tablet     Endocrine   Controlled type 2 diabetes mellitus with hyperglycemia, without long-term current use of insulin ([name]) - Primary (Chronic)    A1c down to 6.8 we will maintain metformin      Relevant Medications   atorvastatin (LIPITOR) 40 MG tablet   metFORMIN (GLUCOPHAGE) 500 MG tablet   Other Relevant Orders   HgB A1c (Completed)     Nervous and Auditory   Cognitive deficit, post-stroke (Chronic)    Continue Aricept      Relevant Medications   donepezil (ARICEPT) 10 MG tablet   QUEtiapine (SEROQUEL) 25 MG tablet     Other   Dyslipidemia, goal LDL below 70    Continue atorvastatin at current dose level      Relevant Medications   amLODipine (NORVASC) 10 MG tablet   atorvastatin (LIPITOR) 40 MG tablet   Other Visit Diagnoses     Essential hypertension       Relevant Medications   amLODipine (NORVASC) 10 MG tablet   atorvastatin (LIPITOR) 40 MG tablet   History of CVA with residual deficit       Relevant Medications   atorvastatin (LIPITOR) 40 MG tablet   Type 2 diabetes mellitus with other circulatory complication, without long-term current use of insulin ([name])       Relevant Medications   amLODipine (NORVASC) 10 MG tablet   atorvastatin (LIPITOR) 40 MG tablet   metFORMIN (GLUCOPHAGE) 500 MG tablet   Cough productive of clear sputum       Relevant Medications   cetirizine (ZYRTEC)  10 MG tablet       Meds ordered this encounter  Medications   amLODipine (NORVASC) 10 MG tablet    Sig: Take 1 tablet (10 mg total) by mouth daily. To lower blood pressure    Dispense:  90 tablet    Refill:  2   atorvastatin (LIPITOR) 40 MG tablet    Sig: TAKE 1 TABLET BY MOUTH IN THE EVENING TO LOWER CHOLESTEROL.    Dispense:  90 tablet    Refill:  2   donepezil (ARICEPT) 10 MG tablet    Sig: Take 1 tablet (10 mg total) by mouth at bedtime.    Dispense:  90 tablet    Refill:  1  QUEtiapine (SEROQUEL) 25 MG tablet    Sig: Take 2 tablets (50 mg total) by mouth at bedtime.    Dispense:  60 tablet    Refill:  3   metFORMIN (GLUCOPHAGE) 500 MG tablet    Sig: Take 1 tablet (500 mg total) by mouth 2 (two) times daily with a meal.    Dispense:  180 tablet    Refill:  2   cetirizine (ZYRTEC) 10 MG tablet    Sig: Take 1 tablet (10 mg total) by mouth daily.    Dispense:  30 tablet    Refill:  11   azelastine (ASTELIN) 0.1 % nasal spray    Sig: Place 2 sprays into both nostrils 2 (two) times daily. Use in each nostril as directed    Dispense:  30 mL    Refill:  12    Follow-up: Return in about 3 months (around 02/14/2022).    Asencion Noble, MD

## 2021-11-14 ENCOUNTER — Encounter: Payer: Self-pay | Admitting: Critical Care Medicine

## 2021-11-14 ENCOUNTER — Other Ambulatory Visit: Payer: Self-pay

## 2021-11-14 ENCOUNTER — Ambulatory Visit: Payer: Medicare Other | Attending: Critical Care Medicine | Admitting: Critical Care Medicine

## 2021-11-14 VITALS — BP 141/88 | HR 75 | Temp 98.1°F | Resp 16 | Ht 72.0 in | Wt 191.2 lb

## 2021-11-14 DIAGNOSIS — R058 Other specified cough: Secondary | ICD-10-CM

## 2021-11-14 DIAGNOSIS — E785 Hyperlipidemia, unspecified: Secondary | ICD-10-CM

## 2021-11-14 DIAGNOSIS — I6300 Cerebral infarction due to thrombosis of unspecified precerebral artery: Secondary | ICD-10-CM | POA: Diagnosis not present

## 2021-11-14 DIAGNOSIS — I6930 Unspecified sequelae of cerebral infarction: Secondary | ICD-10-CM | POA: Diagnosis not present

## 2021-11-14 DIAGNOSIS — I69319 Unspecified symptoms and signs involving cognitive functions following cerebral infarction: Secondary | ICD-10-CM | POA: Diagnosis not present

## 2021-11-14 DIAGNOSIS — I658 Occlusion and stenosis of other precerebral arteries: Secondary | ICD-10-CM | POA: Diagnosis not present

## 2021-11-14 DIAGNOSIS — E1165 Type 2 diabetes mellitus with hyperglycemia: Secondary | ICD-10-CM

## 2021-11-14 DIAGNOSIS — E1159 Type 2 diabetes mellitus with other circulatory complications: Secondary | ICD-10-CM | POA: Diagnosis not present

## 2021-11-14 DIAGNOSIS — I10 Essential (primary) hypertension: Secondary | ICD-10-CM | POA: Diagnosis not present

## 2021-11-14 LAB — POCT GLYCOSYLATED HEMOGLOBIN (HGB A1C)
HbA1c POC (<> result, manual entry): 6.8 % (ref 4.0–5.6)
Hemoglobin A1C: 6.8 % — AB (ref 4.0–5.6)

## 2021-11-14 MED ORDER — METFORMIN HCL 500 MG PO TABS: 500.0000 mg | ORAL_TABLET | Freq: Two times a day (BID) | ORAL | 2 refills | Status: DC

## 2021-11-14 MED ORDER — QUETIAPINE FUMARATE 25 MG PO TABS: 50.0000 mg | ORAL_TABLET | Freq: Every day | ORAL | 3 refills | Status: DC

## 2021-11-14 MED ORDER — DONEPEZIL HCL 10 MG PO TABS: 10.0000 mg | ORAL_TABLET | Freq: Every day | ORAL | 1 refills | Status: DC

## 2021-11-14 MED ORDER — AMLODIPINE BESYLATE 10 MG PO TABS: 10.0000 mg | ORAL_TABLET | Freq: Every day | ORAL | 2 refills | Status: DC

## 2021-11-14 MED ORDER — AZELASTINE HCL 0.1 % NA SOLN: 2.0000 | Freq: Two times a day (BID) | NASAL | 12 refills | Status: DC

## 2021-11-14 MED ORDER — CETIRIZINE HCL 10 MG PO TABS: 10.0000 mg | ORAL_TABLET | Freq: Every day | ORAL | 11 refills | Status: DC

## 2021-11-14 MED ORDER — ATORVASTATIN CALCIUM 40 MG PO TABS: ORAL_TABLET | ORAL | 2 refills | Status: DC

## 2021-11-14 NOTE — Assessment & Plan Note (Signed)
History of stroke with mechanical thrombectomy large vessel in 2020 ? ?The patient's been off Plavix he is to maintain aspirin and atorvastatin for life ? ?

## 2021-11-14 NOTE — Patient Instructions (Signed)
Refills on all medications sent to [facility] ? ?Increase amlodipine to 10 mg daily you can take 2 of the 5 mg tablets daily till that bottle is empty but only refilled will be a 10 mg tablet for which she will just simply take 1 tablet ? ?Return to Dr. Joya Gaskins 3 months ?

## 2021-11-14 NOTE — Assessment & Plan Note (Signed)
Continue atorvastatin at current dose level ?

## 2021-11-14 NOTE — Assessment & Plan Note (Signed)
Continue Aricept 

## 2021-11-14 NOTE — Assessment & Plan Note (Signed)
Blood pressure not yet at goal we will increase amlodipine to 10 mg daily ?

## 2021-11-14 NOTE — Assessment & Plan Note (Signed)
As per stroke ?

## 2021-11-14 NOTE — Assessment & Plan Note (Signed)
A1c down to 6.8 we will maintain metformin ?

## 2022-01-05 ENCOUNTER — Ambulatory Visit: Payer: Self-pay | Admitting: *Deleted

## 2022-01-05 NOTE — Telephone Encounter (Signed)
?  Chief Complaint:"Blood in eye" ?Symptoms: Sclera of left eye "With blood spots" States not entire sclera "Spots" Has had cough  "Dr. Franco Nones about that." ?Frequency: This AM "Woke up like that" ?Pertinent Negatives: Patient denies pain, swelling, drainage, not on blood thinners. ?Disposition: '[]'$ ED /'[]'$ Urgent Care (no appt availability in office) / '[]'$ Appointment(In office/virtual)/ '[]'$  [facility]/ '[]'$ Home Care/ '[]'$ Refused Recommended Disposition /'[]'$ [facility]/ '[x]'$  Follow-up with PCP ?Additional Notes:Pt's wife calling, pt present.  ? Per protocol pt to be seen within 3 days, no availability. Called during practice lunch break. Assured pt NT would route to practice for PCPs review and final disposition. Care advise per protocol provided, verbalizes understanding.  ?Reason for Disposition ? Bleeding on white of the eye ? ?Answer Assessment - Initial Assessment Questions ?1. LOCATION: Location: "What's red, the eyeball or the outer eyelids?" (Note: when callers say the eye is red, they usually mean the sclera is red)   ?    Sclera ?2. REDNESS OF SCLERA: "Is the redness in one or both eyes?" "When did the redness start?"  ?    Sclera ?3. ONSET: "When did the eye become red?" (e.g., hours, days)  ?    This AM ?4. EYELIDS: "Are the eyelids red or swollen?" If Yes, ask: "How much?"  ?    no ?5. VISION: "Is there any difficulty seeing clearly?"  ?    no ?6. ITCHING: "Does it feel itchy?" If so ask: "How bad is it" (e.g., Scale 1-10; or mild, moderate, severe) ?    no ?7. PAIN: "Is there any pain? If Yes, ask: "How bad is it?" (e.g., Scale 1-10; or mild, moderate, severe) ?    no ?8. CONTACT LENS: "Do you wear contacts?" ?    no ?9. CAUSE: "What do you think is causing the redness?" ?    no ?10. OTHER SYMPTOMS: "Do you have any other symptoms?" (e.g., fever, runny nose, cough, vomiting) ?      Cough, off and on ? ?Protocols used: Eye - Red Without Pus-A-AH ? ?

## 2022-01-05 NOTE — Telephone Encounter (Signed)
Would you like to work patient in next week. ?

## 2022-01-05 NOTE — Telephone Encounter (Signed)
Ok to add on next week

## 2022-01-05 NOTE — Telephone Encounter (Signed)
Patient has been added

## 2022-01-06 ENCOUNTER — Ambulatory Visit (HOSPITAL_COMMUNITY)
Admission: EM | Admit: 2022-01-06 | Discharge: 2022-01-06 | Disposition: A | Discharge status: Home / Self Care | Payer: Medicare Other | Attending: Family Medicine | Admitting: Family Medicine

## 2022-01-06 ENCOUNTER — Encounter (HOSPITAL_COMMUNITY): Payer: Self-pay | Admitting: Emergency Medicine

## 2022-01-06 DIAGNOSIS — H1132 Conjunctival hemorrhage, left eye: Secondary | ICD-10-CM

## 2022-01-06 NOTE — ED Triage Notes (Signed)
Pt is present today wit left eye bleeding. Pt noticed the bleeding x2 days ago. Pt denies any pain, blurry vision, or injury ?

## 2022-01-06 NOTE — ED Provider Notes (Signed)
?[facility] ? ? ? ?CSN: [id number] ?Arrival date & time: 01/06/22  1110 ? ? ?  ? ?History   ?Chief Complaint ?Chief Complaint  ?Patient presents with  ? Eye Problem  ? ? ?HPI ?Joe Eddy Sr. is a 71 y.o. male.  ? ?Patient is here for 2 days of redness to the left eye.   ?No pain.  No drainage.  No change in vision.   ?No heavy lifting, no vomiting.  He feels very well otherwise.  ?No change in medications.  ? ? ?Past Medical History:  ?Diagnosis Date  ? Acute metabolic encephalopathy 1/60/1093  ? 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 Cerebrovascular accident (CVA) due to occlusion of vertebral artery ([place])   ? Diabetes mellitus without complication ([place])   ? Dysphagia   ? post stroke  ? Hypertension   ? Left upper lobe pneumonia 02/15/2021  ? Stroke [facility])   ? ? ?Patient Active Problem List  ? Diagnosis Date Noted  ? Left hemiparesis ([place]) 12/06/2020  ? Benign essential HTN   ? Cognitive deficit, post-stroke   ? Controlled type 2 diabetes mellitus with hyperglycemia, without long-term current use of insulin ([place])   ? 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 Dyslipidemia, goal LDL below 70 03/12/2019  ? Dysphagia due to recent stroke 03/12/2019  ? 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 Gait disturbance, post-stroke   ? 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 ? ?Past Surgical History:  ?Procedure Laterality Date  ? IR ANGIO INTRA EXTRACRAN SEL COM CAROTID INNOMINATE UNI L MOD SED  03/10/2019  ? IR ANGIO VERTEBRAL SEL SUBCLAVIAN INNOMINATE BILAT MOD SED  03/10/2019  ? IR CT HEAD LTD  03/10/2019  ? IR PERCUTANEOUS ART THROMBECTOMY/INFUSION INTRACRANIAL INC DIAG ANGIO  03/10/2019  ? RADIOLOGY WITH ANESTHESIA N/A 03/10/2019  ? Procedure: IR WITH ANESTHESIA/CODE STROKE;  Surgeon: Radiologist, Medication, MD;   Location: [place];  Service: Radiology;  Laterality: N/A;  ? ? ? ? ? ?Home Medications   ? ?Prior to Admission medications   ?Medication Sig Start Date End Date Taking? Authorizing Provider  ?acetaminophen (TYLENOL) 325 MG tablet Take 2 tablets (650 mg total) by mouth every 4 (four) hours as needed for mild pain (or temp > 37.5 C (99.5 F)). 06/19/19   Angiulli, Lavon Paganini, PA-C  ?amLODipine (NORVASC) 10 MG tablet Take 1 tablet (10 mg total) by mouth daily. To lower blood pressure 11/14/21   Elsie Stain, MD  ?aspirin EC 81 MG tablet Take 1 tablet (81 mg total) by mouth daily. 07/26/20   Elsie Stain, MD  ?atorvastatin (LIPITOR) 40 MG tablet TAKE 1 TABLET BY MOUTH IN THE EVENING TO LOWER CHOLESTEROL. 11/14/21   Elsie Stain, MD  ?azelastine (ASTELIN) 0.1 % nasal spray Place 2 sprays into both nostrils 2 (two) times daily. Use in each nostril as directed 11/14/21   Elsie Stain, MD  ?cetirizine (ZYRTEC) 10 MG tablet Take 1 tablet (10 mg total) by mouth daily. 11/14/21   Elsie Stain, MD  ?donepezil (ARICEPT) 10 MG tablet Take 1 tablet (10 mg total) by mouth at bedtime. 11/14/21   Elsie Stain, MD  ?metFORMIN (GLUCOPHAGE) 500 MG tablet Take 1 tablet (500 mg total) by mouth 2 (two) times daily with a meal. 11/14/21   Joya Gaskins,  Burnett Harry, MD  ?QUEtiapine (SEROQUEL) 25 MG tablet Take 2 tablets (50 mg total) by mouth at bedtime. 11/14/21   Elsie Stain, MD  ? ? ?Family History ?Family History  ?Problem Relation Age of Onset  ? Diabetes Other   ? Hypertension Other   ? Healthy Mother   ? Healthy Father   ? ? ?Social History ?Social History  ? ?Tobacco Use  ? Smoking status: Never  ? Smokeless tobacco: Never  ?Vaping Use  ? Vaping Use: Never used  ?Substance Use Topics  ? Alcohol use: No  ? Drug use: No  ? ? ? ?Allergies   ?Patient has no known allergies. ? ? ?Review of Systems ?Review of Systems  ?Constitutional: Negative.   ?HENT: Negative.    ?Eyes:  Positive for redness. Negative for photophobia,  pain, discharge, itching and visual disturbance.  ?Respiratory: Negative.    ?Cardiovascular: Negative.   ?Gastrointestinal: Negative.   ?Genitourinary: Negative.   ? ? ?Physical Exam ?Triage Vital Signs ?ED Triage Vitals  ?Enc Vitals Group  ?   BP 01/06/22 1127 126/76  ?   Pulse Rate 01/06/22 1127 66  ?   Resp 01/06/22 1127 18  ?   Temp 01/06/22 1127 98.8 ?F (37.1 ?C)  ?   Temp Source 01/06/22 1127 Oral  ?   SpO2 01/06/22 1127 95 %  ?   Weight --   ?   Height --   ?   Head Circumference --   ?   Peak Flow --   ?   Pain Score 01/06/22 1126 0  ?   Pain Loc --   ?   Pain Edu? --   ?   Excl. in [place]? --   ? ?No data found. ? ?Updated Vital Signs ?BP 126/76   Pulse 66   Temp 98.8 ?F (37.1 ?C) (Oral)   Resp 18   SpO2 95%  ? ?Visual Acuity ?Right Eye Distance:   ?Left Eye Distance:   ?Bilateral Distance:   ? ?Right Eye Near:   ?Left Eye Near:    ?Bilateral Near:    ? ?Physical Exam ?Constitutional:   ?   Appearance: Normal appearance.  ?HENT:  ?   Head: Normocephalic and atraumatic.  ?Eyes:  ?   General: Lids are normal.  ?   Extraocular Movements: Extraocular movements intact.  ?   Conjunctiva/sclera:  ?   Left eye: Hemorrhage present.  ?   Comments: Small area of hemorrhage just left of the iris  ?Cardiovascular:  ?   Rate and Rhythm: Normal rate and regular rhythm.  ?Pulmonary:  ?   Effort: Pulmonary effort is normal.  ?Musculoskeletal:  ?   Cervical back: Normal range of motion and neck supple.  ?Skin: ?   General: Skin is warm.  ?Neurological:  ?   General: No focal deficit present.  ?Psychiatric:     ?   Mood and Affect: Mood normal.  ? ? ? ?UC Treatments / Results  ?Labs ?(all labs ordered are listed, but only abnormal results are displayed) ?Labs Reviewed - No data to display ? ?EKG ? ? ?Radiology ?No results found. ? ?Procedures ?Procedures (including critical care time) ? ?Medications Ordered in UC ?Medications - No data to display ? ?Initial Impression / Assessment and Plan / UC Course  ?I have reviewed the  triage vital signs and the nursing notes. ? ?Pertinent labs & imaging results that were available during my care of the patient were reviewed  by me and considered in my medical decision making (see chart for details). ? ?  ? ?Final Clinical Impressions(s) / UC Diagnoses  ? ?Final diagnoses:  ?Conjunctival hemorrhage of left eye  ? ? ? ?Discharge Instructions   ? ?  ?You were seen for hemorrhage of the left eye.   ?Your exam overall looks very good without concern.  ?This is a common problem that will usually resolve on its own without harm.  I have included paperwork for you today for information on this.  If he develops pain or blurry vision then please see an eye specialist for evaluation.  ? ? ? ?ED Prescriptions   ?None ?  ? ?PDMP not reviewed this encounter. ?  ?Rondel Oh, MD ?01/06/22 1145 ? ?

## 2022-01-06 NOTE — Discharge Instructions (Addendum)
You were seen for hemorrhage of the left eye.   ?Your exam overall looks very good without concern.  ?This is a common problem that will usually resolve on its own without harm.  I have included paperwork for you today for information on this.  If he develops pain or blurry vision then please see an eye specialist for evaluation.  ?

## 2022-01-18 ENCOUNTER — Ambulatory Visit: Payer: Medicare Other | Attending: Critical Care Medicine | Admitting: Critical Care Medicine

## 2022-01-18 ENCOUNTER — Encounter: Payer: Self-pay | Admitting: Critical Care Medicine

## 2022-01-18 VITALS — BP 133/85 | HR 95 | Wt 190.8 lb

## 2022-01-18 DIAGNOSIS — I69391 Dysphagia following cerebral infarction: Secondary | ICD-10-CM | POA: Diagnosis not present

## 2022-01-18 DIAGNOSIS — I69319 Unspecified symptoms and signs involving cognitive functions following cerebral infarction: Secondary | ICD-10-CM | POA: Diagnosis not present

## 2022-01-18 DIAGNOSIS — E785 Hyperlipidemia, unspecified: Secondary | ICD-10-CM | POA: Diagnosis not present

## 2022-01-18 DIAGNOSIS — I10 Essential (primary) hypertension: Secondary | ICD-10-CM | POA: Diagnosis not present

## 2022-01-18 MED ORDER — QUETIAPINE FUMARATE 100 MG PO TABS: 100.0000 mg | ORAL_TABLET | Freq: Every day | ORAL | 4 refills | Status: DC

## 2022-01-18 NOTE — Assessment & Plan Note (Signed)
Blood pressure well controlled no changes ?

## 2022-01-18 NOTE — Patient Instructions (Signed)
Trial of Seroquel 100 mg at bedtime sent to pharmacy to see if this will allow him to sleep better ? ?No other medication change ? ?Return to see Dr. Joya Gaskins 3 months this can be a virtual visit ?

## 2022-01-18 NOTE — Assessment & Plan Note (Signed)
Not sleeping well there is sleep disruption due to his stroke plan is to increase Seroquel 100 mg at bedtime to see if this will improve sleep ?

## 2022-01-18 NOTE — Assessment & Plan Note (Signed)
Continue cholesterol therapy ?

## 2022-01-18 NOTE — Progress Notes (Signed)
? ?Established Patient Office Visit ? ?Subjective:  ?Patient ID: Joe Eddy Sr., male    DOB: 14-Jan-1951  Age: 71 y.o. MRN: [id number] ? ?CC:  ?Eye issues, conjunctival bleeding ? ?HPI ?11/2021 ?Joe Eddy Sr. presents for hypertension and follow-up.  Patient is accompanied by his spouse.  On arrival blood pressure remains elevated 147/88.  On recheck it is in the same range.  He has history of dysphagia due to recent stroke.  He still coughs when he eats.  Patient maintains low-dose amlodipine.  Patient does main taint atorvastatin and aspirin.  He is now off the Plavix. ? ?Patient also maintaining Aricept for chronic memory loss.  He is fully vaccinated.  On arrival A1c is 6.8.  He maintains the metformin.  There are no other complaints at this time. ? ?5/17 ?Patient returns in follow-up continues with chronic cough off-and-on related to aspiration and dysphagia.  The patient had 1 coughing paroxysm and developed left conjunctival bleed.  Patient was seen in urgent care May 7 and was just given conservative observation.  Since that time the bleeding has resolved. ? ?Patient has no other complaints at this time.  The wife is pondering may be taking him back to [place] to see his children as they are not able to travel to the [place] due to visa restrictions at this time. ? ?Patient also is not sleeping well at night he is taking the 50 mg of Seroquel only at bedtime ? ?Note at the last visit A1c was at goal and blood pressure today is good 133/85 ? ?Past Medical History:  ?Diagnosis Date  ? Acute metabolic encephalopathy 5/70/1779  ? 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 Cerebrovascular accident (CVA) due to occlusion of vertebral artery ([place])   ? Diabetes mellitus without complication ([place])   ? Dysphagia   ? post stroke  ? Hypertension   ? Left upper lobe pneumonia 02/15/2021  ? Stroke [facility])   ? ? ?Past Surgical History:  ?Procedure  Laterality Date  ? IR ANGIO INTRA EXTRACRAN SEL COM CAROTID INNOMINATE UNI L MOD SED  03/10/2019  ? IR ANGIO VERTEBRAL SEL SUBCLAVIAN INNOMINATE BILAT MOD SED  03/10/2019  ? IR CT HEAD LTD  03/10/2019  ? IR PERCUTANEOUS ART THROMBECTOMY/INFUSION INTRACRANIAL INC DIAG ANGIO  03/10/2019  ? RADIOLOGY WITH ANESTHESIA N/A 03/10/2019  ? Procedure: IR WITH ANESTHESIA/CODE STROKE;  Surgeon: Radiologist, Medication, MD;  Location: [place];  Service: Radiology;  Laterality: N/A;  ? ? ?Family History  ?Problem Relation Age of Onset  ? Diabetes Other   ? Hypertension Other   ? Healthy Mother   ? Healthy Father   ? ? ?Social History  ? ?Socioeconomic History  ? Marital status: Married  ?  Spouse name: [name]  ? Number of children: Not on file  ? Years of education: Not on file  ? Highest education level: Not on file  ?Occupational History  ? Not on file  ?Tobacco Use  ? Smoking status: Never  ? Smokeless tobacco: Never  ?Vaping Use  ? Vaping Use: Never used  ?Substance and Sexual Activity  ? Alcohol use: No  ? Drug use: No  ? Sexual activity: Not Currently  ?Other Topics Concern  ? Not on file  ?Social History Narrative  ? 12/16/19 Lives with wife  ? ?Social Determinants of Health  ? ?Financial Resource Strain: Not on file  ?Food Insecurity: Not on file  ?Transportation  Needs: Not on file  ?Physical Activity: Not on file  ?Stress: Not on file  ?Social Connections: Not on file  ?Intimate Partner Violence: Not on file  ? ? ?Outpatient Medications Prior to Visit  ?Medication Sig Dispense Refill  ? acetaminophen (TYLENOL) 325 MG tablet Take 2 tablets (650 mg total) by mouth every 4 (four) hours as needed for mild pain (or temp > 37.5 C (99.5 F)).    ? amLODipine (NORVASC) 10 MG tablet Take 1 tablet (10 mg total) by mouth daily. To lower blood pressure 90 tablet 2  ? aspirin EC 81 MG tablet Take 1 tablet (81 mg total) by mouth daily. 100 tablet 3  ? atorvastatin (LIPITOR) 40 MG tablet TAKE 1 TABLET BY MOUTH IN THE EVENING TO LOWER CHOLESTEROL. 90  tablet 2  ? azelastine (ASTELIN) 0.1 % nasal spray Place 2 sprays into both nostrils 2 (two) times daily. Use in each nostril as directed 30 mL 12  ? cetirizine (ZYRTEC) 10 MG tablet Take 1 tablet (10 mg total) by mouth daily. 30 tablet 11  ? donepezil (ARICEPT) 10 MG tablet Take 1 tablet (10 mg total) by mouth at bedtime. 90 tablet 1  ? metFORMIN (GLUCOPHAGE) 500 MG tablet Take 1 tablet (500 mg total) by mouth 2 (two) times daily with a meal. 180 tablet 2  ? QUEtiapine (SEROQUEL) 25 MG tablet Take 2 tablets (50 mg total) by mouth at bedtime. 60 tablet 3  ? ?No facility-administered medications prior to visit.  ? ? ?No Known Allergies ? ?ROS ?Review of Systems  ?Constitutional: Negative.   ?HENT:  Positive for trouble swallowing. Negative for ear pain, postnasal drip, rhinorrhea, sinus pressure, sore throat and voice change.   ?Eyes: Negative.   ?Respiratory:  Positive for cough. Negative for apnea, choking, chest tightness, shortness of breath, wheezing and stridor.   ?Cardiovascular: Negative.  Negative for chest pain, palpitations and leg swelling.  ?Gastrointestinal: Negative.  Negative for abdominal distention, abdominal pain, nausea and vomiting.  ?Genitourinary: Negative.   ?Musculoskeletal: Negative.  Negative for arthralgias and myalgias.  ?Skin: Negative.  Negative for rash.  ?Allergic/Immunologic: Negative.  Negative for environmental allergies and food allergies.  ?Neurological: Negative.  Negative for dizziness, syncope, weakness and headaches.  ?Hematological: Negative.  Negative for adenopathy. Does not bruise/bleed easily.  ?Psychiatric/Behavioral:  Positive for sleep disturbance. Negative for agitation. The patient is not nervous/anxious.   ? ?  ?Objective:  ?  ?Physical Exam ?Vitals reviewed.  ?Constitutional:   ?   Appearance: Normal appearance. He is well-developed. He is obese. He is not diaphoretic.  ?HENT:  ?   Head: Normocephalic and atraumatic.  ?   Nose: No nasal deformity, septal  deviation, mucosal edema or rhinorrhea.  ?   Right Sinus: No maxillary sinus tenderness or frontal sinus tenderness.  ?   Left Sinus: No maxillary sinus tenderness or frontal sinus tenderness.  ?   Mouth/Throat:  ?   Pharynx: No oropharyngeal exudate.  ?Eyes:  ?   General: No scleral icterus. ?   Conjunctiva/sclera: Conjunctivae normal.  ?   Pupils: Pupils are equal, round, and reactive to light.  ?Neck:  ?   Thyroid: No thyromegaly.  ?   Vascular: No carotid bruit or JVD.  ?   Trachea: Trachea normal. No tracheal tenderness or tracheal deviation.  ?Cardiovascular:  ?   Rate and Rhythm: Normal rate and regular rhythm.  ?   Chest Wall: PMI is not displaced.  ?   Pulses: Normal  pulses. No decreased pulses.  ?   Heart sounds: Normal heart sounds, S1 normal and S2 normal. Heart sounds not distant. No murmur heard. ?No systolic murmur is present.  ?No diastolic murmur is present.  ?  No friction rub. No gallop. No S3 or S4 sounds.  ?Pulmonary:  ?   Effort: Pulmonary effort is normal. No tachypnea, accessory muscle usage or respiratory distress.  ?   Breath sounds: Normal breath sounds. No stridor. No decreased breath sounds, wheezing, rhonchi or rales.  ?Chest:  ?   Chest wall: No tenderness.  ?Abdominal:  ?   General: Bowel sounds are normal. There is no distension.  ?   Palpations: Abdomen is soft. Abdomen is not rigid.  ?   Tenderness: There is no abdominal tenderness. There is no guarding or rebound.  ?Musculoskeletal:     ?   General: Normal range of motion.  ?   Cervical back: Normal range of motion and neck supple. No edema, erythema or rigidity. No muscular tenderness. Normal range of motion.  ?Lymphadenopathy:  ?   Head:  ?   Right side of head: No submental or submandibular adenopathy.  ?   Left side of head: No submental or submandibular adenopathy.  ?   Cervical: No cervical adenopathy.  ?Skin: ?   General: Skin is warm and dry.  ?   Coloration: Skin is not pale.  ?   Findings: No rash.  ?   Nails: There is  no clubbing.  ?Neurological:  ?   Mental Status: He is alert and oriented to person, place, and time. Mental status is at baseline.  ?   Sensory: No sensory deficit.  ?   Motor: Weakness present.  ?   Coordinat

## 2022-01-18 NOTE — Assessment & Plan Note (Signed)
Persistent dysphagia due to stroke and is maximized speech therapy ? ?Patient and wife advised to use smaller bites for food and watch thin liquids ? ?The conjunctival hemorrhage was likely due to coughing paroxysm ? ?Conjunctival hemorrhage has resolved in the left eye ?

## 2022-02-21 ENCOUNTER — Ambulatory Visit: Payer: Medicare Other | Admitting: Critical Care Medicine

## 2022-03-27 DIAGNOSIS — E669 Obesity, unspecified: Secondary | ICD-10-CM | POA: Insufficient documentation

## 2022-03-27 NOTE — Progress Notes (Unsigned)
Established Patient Office Visit  Subjective:  Patient ID: Joe Perry., male    DOB: Jan 01, 1951  Age: 71 y.o. MRN: [id number]  CC:  Eye issues, conjunctival bleeding  HPI 11/2021 Joe Perry Sr. presents for hypertension and follow-up.  Patient is accompanied by his spouse.  On arrival blood pressure remains elevated 147/88.  On recheck it is in the same range.  He has history of dysphagia due to recent stroke.  He still coughs when he eats.  Patient maintains low-dose amlodipine.  Patient does main taint atorvastatin and aspirin.  He is now off the Plavix.  Patient also maintaining Aricept for chronic memory loss.  He is fully vaccinated.  On arrival A1c is 6.8.  He maintains the metformin.  There are no other complaints at this time.  5/17 Patient returns in follow-up continues with chronic cough off-and-on related to aspiration and dysphagia.  The patient had 1 coughing paroxysm and developed left conjunctival bleed.  Patient was seen in urgent care May 7 and was just given conservative observation.  Since that time the bleeding has resolved.  Patient has no other complaints at this time.  The wife is pondering may be taking him back to [place] to see his children as they are not able to travel to the [place] due to visa restrictions at this time.  Patient also is not sleeping well at night he is taking the 50 mg of Seroquel only at bedtime  Note at the last visit A1c was at goal and blood pressure today is good 133/85  7/25 Patient seen in return follow-up he is having a bit more coughing with eating but he tends to eat in larger amounts.  The wife would like for him to go back to physical therapy to get him stronger.  On arrival blood pressure is stable 135/81  Patient does have some edema in the lower extremities.  Patient is compliant with amlodipine 10 mg daily and takes aspirin 81 mg daily after stroke.  Patient does maintain the atorvastatin daily.  Metformin  is maintained twice daily.  Patient is sleeping better on Seroquel.  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 ([place]) 06/03/2019   Cerebrovascular accident (CVA) due to occlusion of vertebral artery ([name])    Diabetes mellitus without complication ([place])    Dysphagia    post stroke   Hypertension    Left upper lobe pneumonia 02/15/2021   Stroke [facility])     Past Surgical History:  Procedure Laterality Date   IR ANGIO INTRA EXTRACRAN SEL COM CAROTID INNOMINATE UNI L MOD SED  03/10/2019   IR ANGIO VERTEBRAL SEL SUBCLAVIAN INNOMINATE BILAT MOD SED  03/10/2019   IR CT HEAD LTD  03/10/2019   IR PERCUTANEOUS ART THROMBECTOMY/INFUSION INTRACRANIAL INC DIAG ANGIO  03/10/2019   RADIOLOGY WITH ANESTHESIA N/A 03/10/2019   Procedure: IR WITH ANESTHESIA/CODE STROKE;  Surgeon: Radiologist, Medication, MD;  Location: [place];  Service: Radiology;  Laterality: N/A;    Family History  Problem Relation Age of Onset   Diabetes Other    Hypertension Other    Healthy Mother    Healthy Father     Social History   Socioeconomic History   Marital status: Married    Spouse name: [name]   Number of children: Not on file   Years of education:  Not on file   Highest education level: Not on file  Occupational History   Not on file  Tobacco Use   Smoking status: Never   Smokeless tobacco: Never  Vaping Use   Vaping Use: Never used  Substance and Sexual Activity   Alcohol use: No   Drug use: No   Sexual activity: Not Currently  Other Topics Concern   Not on file  Social History Narrative   12/16/19 Lives with wife   Social Determinants of Health   Financial Resource Strain: Not on file  Food Insecurity: Not on file  Transportation Needs: Not on file  Physical Activity: Not on file  Stress: Not on file  Social Connections: Not on file  Intimate  Partner Violence: Not on file    Outpatient Medications Prior to Visit  Medication Sig Dispense Refill   acetaminophen (TYLENOL) 325 MG tablet Take 2 tablets (650 mg total) by mouth every 4 (four) hours as needed for mild pain (or temp > 37.5 C (99.5 F)).     aspirin EC 81 MG tablet Take 1 tablet (81 mg total) by mouth daily. 100 tablet 3   azelastine (ASTELIN) 0.1 % nasal spray Place 2 sprays into both nostrils 2 (two) times daily. Use in each nostril as directed 30 mL 12   cetirizine (ZYRTEC) 10 MG tablet Take 1 tablet (10 mg total) by mouth daily. 30 tablet 11   amLODipine (NORVASC) 10 MG tablet Take 1 tablet (10 mg total) by mouth daily. To lower blood pressure 90 tablet 2   atorvastatin (LIPITOR) 10 MG tablet 1 tab(s) orally once a day for 30 day(s)     atorvastatin (LIPITOR) 40 MG tablet TAKE 1 TABLET BY MOUTH IN THE EVENING TO LOWER CHOLESTEROL. 90 tablet 2   clopidogrel (PLAVIX) 75 MG tablet 1 tab(s) orally once a day for 30 days     donepezil (ARICEPT) 10 MG tablet Take 1 tablet (10 mg total) by mouth at bedtime. 90 tablet 1   losartan (COZAAR) 50 MG tablet 1 tab(s) orally once a day for 30 day(s)     metFORMIN (GLUCOPHAGE) 500 MG tablet Take 1 tablet (500 mg total) by mouth 2 (two) times daily with a meal. 180 tablet 2   QUEtiapine (SEROQUEL) 100 MG tablet Take 1 tablet (100 mg total) by mouth at bedtime. 30 tablet 4   No facility-administered medications prior to visit.    No Known Allergies  ROS Review of Systems  Constitutional: Negative.   HENT:  Positive for trouble swallowing. Negative for ear pain, postnasal drip, rhinorrhea, sinus pressure, sore throat and voice change.   Eyes: Negative.   Respiratory:  Positive for cough. Negative for apnea, choking, chest tightness, shortness of breath, wheezing and stridor.   Cardiovascular: Negative.  Negative for chest pain, palpitations and leg swelling.  Gastrointestinal: Negative.  Negative for abdominal distention, abdominal  pain, nausea and vomiting.  Genitourinary: Negative.   Musculoskeletal: Negative.  Negative for arthralgias and myalgias.  Skin: Negative.  Negative for rash.  Allergic/Immunologic: Negative.  Negative for environmental allergies and food allergies.  Neurological: Negative.  Negative for dizziness, syncope, weakness and headaches.  Hematological: Negative.  Negative for adenopathy. Does not bruise/bleed easily.  Psychiatric/Behavioral:  Positive for sleep disturbance. Negative for agitation. The patient is not nervous/anxious.       Objective:    Physical Exam Vitals reviewed.  Constitutional:      Appearance: Normal appearance. He is well-developed. He is obese. He is  not diaphoretic.  HENT:     Head: Normocephalic and atraumatic.     Nose: No nasal deformity, septal deviation, mucosal edema or rhinorrhea.     Right Sinus: No maxillary sinus tenderness or frontal sinus tenderness.     Left Sinus: No maxillary sinus tenderness or frontal sinus tenderness.     Mouth/Throat:     Pharynx: No oropharyngeal exudate.  Eyes:     General: No scleral icterus.    Conjunctiva/sclera: Conjunctivae normal.     Pupils: Pupils are equal, round, and reactive to light.  Neck:     Thyroid: No thyromegaly.     Vascular: No carotid bruit or JVD.     Trachea: Trachea normal. No tracheal tenderness or tracheal deviation.  Cardiovascular:     Rate and Rhythm: Normal rate and regular rhythm.     Chest Wall: PMI is not displaced.     Pulses: Normal pulses. No decreased pulses.     Heart sounds: Normal heart sounds, S1 normal and S2 normal. Heart sounds not distant. No murmur heard.    No systolic murmur is present.     No diastolic murmur is present.     No friction rub. No gallop. No S3 or S4 sounds.  Pulmonary:     Effort: Pulmonary effort is normal. No tachypnea, accessory muscle usage or respiratory distress.     Breath sounds: Normal breath sounds. No stridor. No decreased breath sounds,  wheezing, rhonchi or rales.  Chest:     Chest wall: No tenderness.  Abdominal:     General: Bowel sounds are normal. There is no distension.     Palpations: Abdomen is soft. Abdomen is not rigid.     Tenderness: There is no abdominal tenderness. There is no guarding or rebound.  Musculoskeletal:        General: Normal range of motion.     Cervical back: Normal range of motion and neck supple. No edema, erythema or rigidity. No muscular tenderness. Normal range of motion.  Lymphadenopathy:     Head:     Right side of head: No submental or submandibular adenopathy.     Left side of head: No submental or submandibular adenopathy.     Cervical: No cervical adenopathy.  Skin:    General: Skin is warm and dry.     Coloration: Skin is not pale.     Findings: No rash.     Nails: There is no clubbing.  Neurological:     Mental Status: He is alert and oriented to person, place, and time. Mental status is at baseline.     Sensory: No sensory deficit.     Motor: Weakness present.     Coordination: Coordination abnormal.     Gait: Gait abnormal.  Psychiatric:        Speech: Speech normal.        Behavior: Behavior normal.     BP 132/80   Pulse 65   Wt 194 lb (88 kg)   SpO2 98%   BMI 26.31 kg/m  Wt Readings from Last 3 Encounters:  03/28/22 194 lb (88 kg)  01/18/22 190 lb 12.8 oz (86.5 kg)  11/14/21 191 lb 3.2 oz (86.7 kg)     Health Maintenance Due  Topic Date Due   COVID-19 Vaccine (4 - Pfizer series) 08/20/2020    There are no preventive care reminders to display for this patient.  Lab Results  Component Value Date   TSH 0.178 (L) 02/15/2021  Lab Results  Component Value Date   WBC 5.3 02/17/2021   HGB 12.3 (L) 02/17/2021   HCT 37.5 (L) 02/17/2021   MCV 93.1 02/17/2021   PLT 188 02/17/2021   Lab Results  Component Value Date   NA 142 08/15/2021   K 4.6 08/15/2021   CO2 24 08/15/2021   GLUCOSE 141 (H) 08/15/2021   BUN 17 08/15/2021   CREATININE 0.92  08/15/2021   BILITOT 1.3 (H) 02/15/2021   ALKPHOS 49 02/15/2021   AST 31 02/15/2021   ALT 25 02/15/2021   PROT 6.4 (L) 02/15/2021   ALBUMIN 3.4 (L) 02/15/2021   CALCIUM 10.1 08/15/2021   ANIONGAP 8 02/17/2021   EGFR 91 08/15/2021   Lab Results  Component Value Date   CHOL 159 03/11/2019   Lab Results  Component Value Date   HDL 45 03/11/2019   Lab Results  Component Value Date   LDLCALC 101 (H) 03/11/2019   Lab Results  Component Value Date   TRIG 67 03/11/2019   Lab Results  Component Value Date   CHOLHDL 3.5 03/11/2019   Lab Results  Component Value Date   HGBA1C 7.1 (A) 03/28/2022      Assessment & Plan:   Problem List Items Addressed This Visit       Cardiovascular and Mediastinum   Dysphagia due to recent stroke (Chronic)    Encourage patient to continue to eat in smaller quantities      Relevant Medications   amLODipine (NORVASC) 10 MG tablet   atorvastatin (LIPITOR) 40 MG tablet   losartan (COZAAR) 50 MG tablet   chlorthalidone (HYGROTON) 25 MG tablet   Benign essential HTN (Chronic)    Blood pressure at goal but since he has some edema we will add chlorthalidone 25 mg daily and continue losartan and amlodipine as prescribed      Relevant Medications   amLODipine (NORVASC) 10 MG tablet   atorvastatin (LIPITOR) 40 MG tablet   losartan (COZAAR) 50 MG tablet   chlorthalidone (HYGROTON) 25 MG tablet     Endocrine   Controlled type 2 diabetes mellitus with hyperglycemia, without long-term current use of insulin ([name]) - Primary (Chronic)    Type 2 diabetes at goal continue metformin as prescribed  A1c 7.1      Relevant Medications   atorvastatin (LIPITOR) 40 MG tablet   losartan (COZAAR) 50 MG tablet   metFORMIN (GLUCOPHAGE) 500 MG tablet   Other Relevant Orders   POCT glucose (manual entry) (Completed)   POCT glycosylated hemoglobin (Hb A1C) (Completed)     Nervous and Auditory   Cognitive deficit, post-stroke (Chronic)   Relevant  Medications   donepezil (ARICEPT) 10 MG tablet   Left hemiparesis ([name]) (Chronic)    Would benefit from another physical therapy evaluation and treatment      Relevant Orders   Ambulatory referral to Physical Therapy   Vestibular schwannoma ([name])   Relevant Medications   donepezil (ARICEPT) 10 MG tablet   QUEtiapine (SEROQUEL) 100 MG tablet     Other   Gait disturbance, post-stroke (Chronic)    As per hemiparesis assessment      Dyslipidemia, goal LDL below 70    Reassess lipid panel this visit and continue atorvastatin      Relevant Medications   amLODipine (NORVASC) 10 MG tablet   atorvastatin (LIPITOR) 40 MG tablet   losartan (COZAAR) 50 MG tablet   chlorthalidone (HYGROTON) 25 MG tablet   Other Relevant Orders   Lipid panel  Other Visit Diagnoses     Essential hypertension       Relevant Medications   amLODipine (NORVASC) 10 MG tablet   atorvastatin (LIPITOR) 40 MG tablet   losartan (COZAAR) 50 MG tablet   chlorthalidone (HYGROTON) 25 MG tablet   Other Relevant Orders   Comprehensive metabolic panel   CBC with Differential/Platelet   History of CVA with residual deficit       Relevant Medications   atorvastatin (LIPITOR) 40 MG tablet   Type 2 diabetes mellitus with other circulatory complication, without long-term current use of insulin ([name])       Relevant Medications   amLODipine (NORVASC) 10 MG tablet   atorvastatin (LIPITOR) 40 MG tablet   losartan (COZAAR) 50 MG tablet   metFORMIN (GLUCOPHAGE) 500 MG tablet   chlorthalidone (HYGROTON) 25 MG tablet   Other Relevant Orders   Comprehensive metabolic panel   CBC with Differential/Platelet      Meds ordered this encounter  Medications   amLODipine (NORVASC) 10 MG tablet    Sig: Take 1 tablet (10 mg total) by mouth daily. To lower blood pressure    Dispense:  90 tablet    Refill:  2   atorvastatin (LIPITOR) 40 MG tablet    Sig: TAKE 1 TABLET BY MOUTH IN THE EVENING TO LOWER CHOLESTEROL.     Dispense:  90 tablet    Refill:  2   donepezil (ARICEPT) 10 MG tablet    Sig: Take 1 tablet (10 mg total) by mouth at bedtime.    Dispense:  90 tablet    Refill:  1   losartan (COZAAR) 50 MG tablet    Sig: 1 tab(s) orally once a day for 30 day(s)    Dispense:  90 tablet    Refill:  2   metFORMIN (GLUCOPHAGE) 500 MG tablet    Sig: Take 1 tablet (500 mg total) by mouth 2 (two) times daily with a meal.    Dispense:  180 tablet    Refill:  2   QUEtiapine (SEROQUEL) 100 MG tablet    Sig: Take 1 tablet (100 mg total) by mouth at bedtime.    Dispense:  30 tablet    Refill:  4    Dose change   chlorthalidone (HYGROTON) 25 MG tablet    Sig: Take 1 tablet (25 mg total) by mouth daily.    Dispense:  30 tablet    Refill:  2    Follow-up: Return in about 3 months (around 06/28/2022) for followup, htn.    Asencion Noble, MD

## 2022-03-28 ENCOUNTER — Encounter: Payer: Self-pay | Admitting: Critical Care Medicine

## 2022-03-28 ENCOUNTER — Ambulatory Visit: Payer: Medicare Other | Attending: Critical Care Medicine | Admitting: Critical Care Medicine

## 2022-03-28 VITALS — BP 132/80 | HR 65 | Wt 194.0 lb

## 2022-03-28 DIAGNOSIS — E785 Hyperlipidemia, unspecified: Secondary | ICD-10-CM | POA: Diagnosis not present

## 2022-03-28 DIAGNOSIS — D333 Benign neoplasm of cranial nerves: Secondary | ICD-10-CM

## 2022-03-28 DIAGNOSIS — E1159 Type 2 diabetes mellitus with other circulatory complications: Secondary | ICD-10-CM | POA: Diagnosis not present

## 2022-03-28 DIAGNOSIS — E1165 Type 2 diabetes mellitus with hyperglycemia: Secondary | ICD-10-CM

## 2022-03-28 DIAGNOSIS — I6930 Unspecified sequelae of cerebral infarction: Secondary | ICD-10-CM

## 2022-03-28 DIAGNOSIS — R269 Unspecified abnormalities of gait and mobility: Secondary | ICD-10-CM

## 2022-03-28 DIAGNOSIS — I69319 Unspecified symptoms and signs involving cognitive functions following cerebral infarction: Secondary | ICD-10-CM

## 2022-03-28 DIAGNOSIS — I69398 Other sequelae of cerebral infarction: Secondary | ICD-10-CM

## 2022-03-28 DIAGNOSIS — I10 Essential (primary) hypertension: Secondary | ICD-10-CM | POA: Diagnosis not present

## 2022-03-28 DIAGNOSIS — I69391 Dysphagia following cerebral infarction: Secondary | ICD-10-CM

## 2022-03-28 DIAGNOSIS — G8194 Hemiplegia, unspecified affecting left nondominant side: Secondary | ICD-10-CM

## 2022-03-28 LAB — POCT GLYCOSYLATED HEMOGLOBIN (HGB A1C): HbA1c, POC (controlled diabetic range): 7.1 % — AB (ref 0.0–7.0)

## 2022-03-28 LAB — GLUCOSE, POCT (MANUAL RESULT ENTRY): POC Glucose: 110 mg/dl — AB (ref 70–99)

## 2022-03-28 MED ORDER — CHLORTHALIDONE 25 MG PO TABS: 25.0000 mg | ORAL_TABLET | Freq: Every day | ORAL | 2 refills | Status: DC

## 2022-03-28 MED ORDER — QUETIAPINE FUMARATE 100 MG PO TABS: 100.0000 mg | ORAL_TABLET | Freq: Every day | ORAL | 4 refills | Status: DC

## 2022-03-28 MED ORDER — METFORMIN HCL 500 MG PO TABS: 500.0000 mg | ORAL_TABLET | Freq: Two times a day (BID) | ORAL | 2 refills | Status: DC

## 2022-03-28 MED ORDER — AMLODIPINE BESYLATE 10 MG PO TABS: 10.0000 mg | ORAL_TABLET | Freq: Every day | ORAL | 2 refills | Status: DC

## 2022-03-28 MED ORDER — LOSARTAN POTASSIUM 50 MG PO TABS: ORAL_TABLET | ORAL | 2 refills | Status: DC

## 2022-03-28 MED ORDER — DONEPEZIL HCL 10 MG PO TABS: 10.0000 mg | ORAL_TABLET | Freq: Every day | ORAL | 1 refills | Status: DC

## 2022-03-28 MED ORDER — ATORVASTATIN CALCIUM 40 MG PO TABS
ORAL_TABLET | ORAL | 2 refills | Status: DC
Start: 2022-03-28 — End: 2022-10-03

## 2022-03-28 NOTE — Patient Instructions (Signed)
Complete screening labs obtained at this visit  Refills on all your medications sent to the pharmacy and we have added a low-dose fluid pill chlorthalidone 1 daily  Referral back to physical therapy was made  Remember to eat in smaller bites of food so you do not cough and choke on your food  Return to see Dr. Joya Gaskins 3 months

## 2022-03-28 NOTE — Assessment & Plan Note (Signed)
Would benefit from another physical therapy evaluation and treatment

## 2022-03-28 NOTE — Assessment & Plan Note (Signed)
Blood pressure at goal but since he has some edema we will add chlorthalidone 25 mg daily and continue losartan and amlodipine as prescribed

## 2022-03-28 NOTE — Assessment & Plan Note (Signed)
Encourage patient to continue to eat in smaller quantities

## 2022-03-28 NOTE — Assessment & Plan Note (Addendum)
Type 2 diabetes at goal continue metformin as prescribed  A1c 7.1

## 2022-03-28 NOTE — Assessment & Plan Note (Signed)
As per hemiparesis assessment

## 2022-03-28 NOTE — Assessment & Plan Note (Signed)
Reassess lipid panel this visit and continue atorvastatin

## 2022-03-29 ENCOUNTER — Telehealth: Payer: Self-pay

## 2022-03-29 LAB — CBC WITH DIFFERENTIAL/PLATELET
Basophils Absolute: 0 10*3/uL (ref 0.0–0.2)
Basos: 1 %
EOS (ABSOLUTE): 0.2 10*3/uL (ref 0.0–0.4)
Eos: 3 %
Hematocrit: 36.3 % — ABNORMAL LOW (ref 37.5–51.0)
Hemoglobin: 12.1 g/dL — ABNORMAL LOW (ref 13.0–17.7)
Immature Grans (Abs): 0 10*3/uL (ref 0.0–0.1)
Immature Granulocytes: 0 %
Lymphocytes Absolute: 2.6 10*3/uL (ref 0.7–3.1)
Lymphs: 49 %
MCH: 30.6 pg (ref 26.6–33.0)
MCHC: 33.3 g/dL (ref 31.5–35.7)
MCV: 92 fL (ref 79–97)
Monocytes Absolute: 0.5 10*3/uL (ref 0.1–0.9)
Monocytes: 9 %
Neutrophils Absolute: 2 10*3/uL (ref 1.4–7.0)
Neutrophils: 38 %
Platelets: 219 10*3/uL (ref 150–450)
RBC: 3.95 x10E6/uL — ABNORMAL LOW (ref 4.14–5.80)
RDW: 12 % (ref 11.6–15.4)
WBC: 5.3 10*3/uL (ref 3.4–10.8)

## 2022-03-29 LAB — LIPID PANEL
Chol/HDL Ratio: 2.3 ratio (ref 0.0–5.0)
Cholesterol, Total: 109 mg/dL (ref 100–199)
HDL: 47 mg/dL (ref >39)
LDL Chol Calc (NIH): 39 mg/dL (ref 0–99)
Triglycerides: 128 mg/dL (ref 0–149)
VLDL Cholesterol Cal: 23 mg/dL (ref 5–40)

## 2022-03-29 LAB — COMPREHENSIVE METABOLIC PANEL
ALT: 16 IU/L (ref 0–44)
AST: 15 IU/L (ref 0–40)
Albumin/Globulin Ratio: 1.6 (ref 1.2–2.2)
Albumin: 4.4 g/dL (ref 3.9–4.9)
Alkaline Phosphatase: 63 IU/L (ref 44–121)
BUN/Creatinine Ratio: 15 (ref 10–24)
BUN: 14 mg/dL (ref 8–27)
Bilirubin Total: 0.5 mg/dL (ref 0.0–1.2)
CO2: 24 mmol/L (ref 20–29)
Calcium: 9.5 mg/dL (ref 8.6–10.2)
Chloride: 105 mmol/L (ref 96–106)
Creatinine, Ser: 0.93 mg/dL (ref 0.76–1.27)
Globulin, Total: 2.8 g/dL (ref 1.5–4.5)
Glucose: 111 mg/dL — ABNORMAL HIGH (ref 70–99)
Potassium: 4.9 mmol/L (ref 3.5–5.2)
Sodium: 142 mmol/L (ref 134–144)
Total Protein: 7.2 g/dL (ref 6.0–8.5)
eGFR: 89 mL/min/{1.73_m2} (ref >59)

## 2022-03-29 NOTE — Telephone Encounter (Signed)
Pt wife was called and is aware of results, DOB was confirmed.

## 2022-03-29 NOTE — Progress Notes (Signed)
Let pt wife know all labs are within normal range cholesterol is perfect no change in medicaitons

## 2022-03-29 NOTE — Telephone Encounter (Signed)
-----   Message from Elsie Stain, MD sent at 03/29/2022  9:59 AM EDT ----- Let pt wife know all labs are within normal range cholesterol is perfect no change in medicaitons

## 2022-04-04 ENCOUNTER — Ambulatory Visit: Payer: Medicare Other | Admitting: Critical Care Medicine

## 2022-04-07 ENCOUNTER — Telehealth: Payer: Self-pay

## 2022-04-07 ENCOUNTER — Ambulatory Visit: Payer: Medicare Other | Attending: Critical Care Medicine

## 2022-04-07 DIAGNOSIS — I6300 Cerebral infarction due to thrombosis of unspecified precerebral artery: Secondary | ICD-10-CM

## 2022-04-07 DIAGNOSIS — R2681 Unsteadiness on feet: Secondary | ICD-10-CM

## 2022-04-07 DIAGNOSIS — G8194 Hemiplegia, unspecified affecting left nondominant side: Secondary | ICD-10-CM | POA: Diagnosis not present

## 2022-04-07 DIAGNOSIS — M6281 Muscle weakness (generalized): Secondary | ICD-10-CM | POA: Diagnosis present

## 2022-04-07 DIAGNOSIS — R278 Other lack of coordination: Secondary | ICD-10-CM

## 2022-04-07 DIAGNOSIS — R2689 Other abnormalities of gait and mobility: Secondary | ICD-10-CM

## 2022-04-07 DIAGNOSIS — I69391 Dysphagia following cerebral infarction: Secondary | ICD-10-CM

## 2022-04-07 DIAGNOSIS — I69319 Unspecified symptoms and signs involving cognitive functions following cerebral infarction: Secondary | ICD-10-CM

## 2022-04-07 NOTE — Telephone Encounter (Signed)
Dr. Joya Gaskins, Mr. Fils was evaluated by PT on 04/07/22.  The patient would benefit from a speech therapy evaluation for dysphagia and cognition. If you agree, please place an order in [facility] workque in [facility] or fax the order to [phone].  Thank you, Debbora Dus, PT, DPT, [facility] [address] [place], [place]  [address] Phone:  [phone] Fax:  [phone]

## 2022-04-07 NOTE — Therapy (Signed)
OUTPATIENT PHYSICAL THERAPY NEURO EVALUATION   Patient Name: Joe MESSIMER Sr. MRN: [id number] DOB:December 03, 1950, 71 y.o., male Today's Date: 04/07/2022   PCP: Elsie Stain, MD REFERRING PROVIDER: Elsie Stain, MD   PT End of Session - 04/07/22 1238     Visit Number 1    Number of Visits 9    Date for PT Re-Evaluation 05/05/22    Authorization Type [employer] Medicare    PT Start Time 30    PT Stop Time 1315    PT Time Calculation (min) 45 min    Equipment Utilized During Treatment Gait belt    Activity Tolerance Patient limited by lethargy    Behavior During Therapy Flat affect             Past Medical History:  Diagnosis Date   Acute metabolic encephalopathy 5/46/2703   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 ([place]) 06/03/2019   Cerebrovascular accident (CVA) due to occlusion of vertebral artery ([name])    Diabetes mellitus without complication ([place])    Dysphagia    post stroke   Hypertension    Left upper lobe pneumonia 02/15/2021   Stroke [facility])    Past Surgical History:  Procedure Laterality Date   IR ANGIO INTRA EXTRACRAN SEL COM CAROTID INNOMINATE UNI L MOD SED  03/10/2019   IR ANGIO VERTEBRAL SEL SUBCLAVIAN INNOMINATE BILAT MOD SED  03/10/2019   IR CT HEAD LTD  03/10/2019   IR PERCUTANEOUS ART THROMBECTOMY/INFUSION INTRACRANIAL INC DIAG ANGIO  03/10/2019   RADIOLOGY WITH ANESTHESIA N/A 03/10/2019   Procedure: IR WITH ANESTHESIA/CODE STROKE;  Surgeon: Radiologist, Medication, MD;  Location: [place];  Service: Radiology;  Laterality: N/A;   Patient Active Problem List   Diagnosis Date Noted   Obesity (BMI 30.0-34.9) 03/27/2022   Left hemiparesis ([place]) 12/06/2020   Benign essential HTN    Cognitive deficit, post-stroke    Controlled type 2 diabetes mellitus with hyperglycemia, without long-term current use of insulin ([name])    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)  THERAPY DIAG:  Other lack of coordination  Muscle weakness (generalized)  Other abnormalities of gait and mobility  Unsteadiness on feet  Rationale for Evaluation and Treatment Rehabilitation  SUBJECTIVE:  SUBJECTIVE STATEMENT: Patient minimally verbal with wife providing most answers. Per wife, patient doesn't want to walk at home or do exercise. Requesting that therapy here "give her a break."  Pt accompanied by: significant other  PERTINENT HISTORY: CVA in 2020  PAIN:  Are you having pain? No  PRECAUTIONS: Fall and Other: L hemi   WEIGHT BEARING RESTRICTIONS No  FALLS: Has patient fallen in last 6 months? No  LIVING ENVIRONMENT: Lives with: lives with their spouse Lives in: House/apartment Stairs: Yes: Internal: 12 steps; on right going up and External: 4 steps; bilateral but cannot reach both Has following equipment at home: Environmental consultant - 2 wheeled, Wheelchair (manual), shower chair, and Grab bars  PLOF: Needs assistance with ADLs, Needs assistance with homemaking, Needs assistance with gait, and Needs assistance with transfers  PATIENT GOALS "to exercise more"  OBJECTIVE:   COGNITION: Overall cognitive status: History of cognitive impairments - at baseline   SENSATION: WFL  COORDINATION: Unable to follow cues to complete accurately    POSTURE: rounded shoulders, forward head, and flexed  trunk    LOWER EXTREMITY MMT:   **unable to follow cues to complete accurately  MMT Right Eval Left Eval  Hip flexion    Hip extension    Hip abduction    Hip adduction    Hip internal rotation    Hip external rotation    Knee flexion    Knee extension    Ankle dorsiflexion    Ankle plantarflexion    Ankle inversion    Ankle eversion    (Blank rows = not tested)  BED MOBILITY:  Per patient, no difficulty   TRANSFERS: Assistive device utilized: Environmental consultant - 2 wheeled  Sit to stand: CGA Stand to sit: CGA Chair to chair: CGA  GAIT: Gait pattern: step to pattern, step through pattern, decreased step length- Left, decreased stride length, Right foot flat, Left foot flat, shuffling, trunk rotated posterior- Left, trunk flexed, narrow BOS, and poor foot clearance- Left Distance walked: clinic Assistive device utilized: Environmental consultant - 2 wheeled Level of assistance: CGA Comments: Patient with very slow gait speed- non functional   FUNCTIONAL TESTs:   [facility] PT Assessment - 04/07/22 0001       Standardized Balance Assessment   Standardized Balance Assessment Five Times Sit to Stand    Five times sit to stand comments  1'06" B UE + consistent cuing      Timed Up and Go Test   Normal TUG (seconds) 127              TODAY'S TREATMENT:  N/A eval   PATIENT EDUCATION: Education details: PT POC, HH vs OP, OM results, importance of completing exercises/walking at home in order to see results, previous dietary recommendations including thickened liquids to minimize risk for aspiration pneumonia  Person educated: Patient and Spouse Education method: Explanation Education comprehension: verbalized understanding and needs further education   HOME EXERCISE PROGRAM: To be provided    GOALS: Goals reviewed with patient? Yes  SHORT TERM GOALS= LTG based on initial POC  LONG TERM GOALS: Target date: 05/05/2022  Pt will complete final HEP with assist from wife as needed for  improved functional strength  Baseline: to be provided Goal status: INITIAL  2.  Pt will improve 5x STS to </= 50 sec to demo improved functional LE strength and balance   Baseline: 66s with B UE + CGA and RW Goal status: INITIAL  3.  Pt will improve TUG to </= 100  secs to demonstrated reduced fall risk  Baseline: 127s with RW + CGA Goal status: INITIAL   ASSESSMENT:  CLINICAL IMPRESSION: Patient is a 71 y.o. male who was seen today for physical therapy evaluation and treatment for impaired functional strength and resultant gait abnormalities due to CVA. Per wife, patient is not motivated to participate in any mobility outside of what is absolutely necessary (walking to the bathroom). Per wife, patient attends [employer], but does not actively participate and seems to be very distracted by the other participants. PT attempting to explain to wife that the PT POC will not bring major improvements to patients mobility unless he does work at home. Patient falling asleep and minimally responsive throughout eval. Of note, patient was previously recommended to be on thickened liquids, per wife, but does not like the taste, so he only drinks thin liquids. Patient with very wet quality to his voice and cough with every PO intake throughout eval. PT attempting to educate patient and wife on importance of adhering to dietary recommendations to minimize risk for aspiration pneumonia. Patient and wife not receptive to this. PT questioning R inattention vs HH as he would walk into objects on the R side with limited awareness/ability to correct his path. Patient completed the Timed Up and Go test (TUG) in 127 seconds.  Geriatrics: need for further assessment of fall risk: ? 12 sec; Recurrent falls: > 15 sec; Vestibular Disorders fall risk: > 15 sec; Parkinson's Disease fall risk: > 16 sec ([email or web address], 2023). Five times Sit to Stand Test (FTSS) TIME: 66 sec  Cut off scores indicative of increased fall  risk: >12 sec CVA, >16 sec PD, >13 sec vestibular (ANPTA Core Set of Outcome Measures for Adults with Neurologic Conditions, 2018). Patient will benefit from trial of OP PT to address the above mentioned deficits.    OBJECTIVE IMPAIRMENTS Abnormal gait, cardiopulmonary status limiting activity, decreased activity tolerance, decreased balance, decreased cognition, decreased coordination, decreased endurance, decreased knowledge of condition, decreased knowledge of use of DME, difficulty walking, decreased safety awareness, and impaired perceived functional ability.   ACTIVITY LIMITATIONS carrying, lifting, bending, standing, stairs, transfers, bed mobility, self feeding, and locomotion level  PARTICIPATION LIMITATIONS: cleaning, interpersonal relationship, driving, shopping, and community activity  PERSONAL FACTORS Age, Behavior pattern, Past/current experiences, Social background, Time since onset of injury/illness/exacerbation, and 1-2 comorbidities: previous CVA, pneumonia   are also affecting patient's functional outcome.   REHAB POTENTIAL: Fair time since onset, poor motivation reported  CLINICAL DECISION MAKING: Evolving/moderate complexity  EVALUATION COMPLEXITY: Moderate  PLAN: PT FREQUENCY: 1x/week  PT DURATION: 4 weeks  PLANNED INTERVENTIONS: Therapeutic exercises, Therapeutic activity, Neuromuscular re-education, Balance training, Gait training, Patient/Family education, Self Care, Joint mobilization, Stair training, Vestibular training, Visual/preceptual remediation/compensation, Orthotic/Fit training, DME instructions, Aquatic Therapy, Cognitive remediation, Electrical stimulation, Wheelchair mobility training, Manual therapy, and Re-evaluation  PLAN FOR NEXT SESSION: assess vision- R inattention? HH?, provide HEP   Debbora Dus, PT Debbora Dus, PT, DPT, [name]  04/07/2022, 3:01 PM

## 2022-04-09 NOTE — Telephone Encounter (Signed)
SLP ref made

## 2022-04-10 ENCOUNTER — Ambulatory Visit: Payer: Medicare Other

## 2022-04-10 DIAGNOSIS — R2681 Unsteadiness on feet: Secondary | ICD-10-CM

## 2022-04-10 DIAGNOSIS — R278 Other lack of coordination: Secondary | ICD-10-CM

## 2022-04-10 DIAGNOSIS — M6281 Muscle weakness (generalized): Secondary | ICD-10-CM

## 2022-04-10 DIAGNOSIS — R2689 Other abnormalities of gait and mobility: Secondary | ICD-10-CM

## 2022-04-10 NOTE — Therapy (Signed)
OUTPATIENT PHYSICAL THERAPY TREATMENT NOTE   Patient Name: Joe Perry. MRN: [id number] DOB:28-Jan-1951, 71 y.o., male Today's Date: 04/10/2022  PCP: Elsie Stain, MD REFERRING PROVIDER: Elsie Stain, MD  END OF SESSION:   PT End of Session - 04/10/22 1351     Visit Number 2    Number of Visits 9    Date for PT Re-Evaluation 05/05/22    Authorization Type [employer]HC Medicare    PT Start Time 79    PT Stop Time 1435   patient in the bathroom during part of session   PT Time Calculation (min) 34 min    Equipment Utilized During Treatment Gait belt    Activity Tolerance Other (comment)   bathroom   Behavior During Therapy Restless             Past Medical History:  Diagnosis Date   Acute metabolic encephalopathy 7/61/9509   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 ([place]) 06/03/2019   Cerebrovascular accident (CVA) due to occlusion of vertebral artery ([name])    Diabetes mellitus without complication ([place])    Dysphagia    post stroke   Hypertension    Left upper lobe pneumonia 02/15/2021   Stroke [facility])    Past Surgical History:  Procedure Laterality Date   IR ANGIO INTRA EXTRACRAN SEL COM CAROTID INNOMINATE UNI L MOD SED  03/10/2019   IR ANGIO VERTEBRAL SEL SUBCLAVIAN INNOMINATE BILAT MOD SED  03/10/2019   IR CT HEAD LTD  03/10/2019   IR PERCUTANEOUS ART THROMBECTOMY/INFUSION INTRACRANIAL INC DIAG ANGIO  03/10/2019   RADIOLOGY WITH ANESTHESIA N/A 03/10/2019   Procedure: IR WITH ANESTHESIA/CODE STROKE;  Surgeon: Radiologist, Medication, MD;  Location: [place];  Service: Radiology;  Laterality: N/A;   Patient Active Problem List   Diagnosis Date Noted   Obesity (BMI 30.0-34.9) 03/27/2022   Left hemiparesis ([place]) 12/06/2020   Benign essential HTN    Cognitive deficit, post-stroke    Controlled type 2 diabetes mellitus with hyperglycemia, without long-term current use of insulin ([name])    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   Dyslipidemia, goal LDL below 70 03/12/2019   Dysphagia due to recent stroke 03/12/2019   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, d/t large vessel dz 03/10/2019   Occlusion of right posterior communicating artery 03/10/2019   Gait disturbance, post-stroke    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    REFERRING DIAG: G81.94 (ICD-10-CM) - Left hemiparesis ([place])  THERAPY DIAG:  Other abnormalities of gait and mobility  Muscle weakness (generalized)  Other lack of coordination  Unsteadiness on feet  Rationale for Evaluation and Treatment Rehabilitation  PERTINENT HISTORY: CVA in 2020  PRECAUTIONS: fall, L hemi, thickened liquid recommendation  SUBJECTIVE: Patient reports doing okay. Per wife, "did nothing" over the weekend. He states that he walks ~83f at a time in his house. Per wife, patient walks ~181fonly 4-5x/ day.   PAIN:  Are you having pain? No  TODAY'S TREATMENT:  NMR: -confrontation- profound L inattention/visual field cut? (Patient with significant difficulty following simple cues "keep you eyes on my nose and tell me if you see my finger" -no extinction noted -patient becoming very restless with frequent standing without notice/need (BP 145/101)  -patient went to the bathroom and BP after: 132/65 with less restless movement  -provided HEP34 -provided info on [facility]ACE   PATIENT EDUCATION: Education details: PT POC, importance of moving outside of planned PT time, HEP  Person educated: Patient and Spouse Education method: Explanation Education comprehension: verbalized understanding and needs further education     HOME EXERCISE PROGRAM: Access Code: [email or web address] URL: [email or web address] Date: 04/10/2022 Prepared by: Estevan Ryder  Exercises - Sit to Stand with Counter Support  - 1 x daily - 7 x weekly - 3 sets - 10 reps - Seated Long Arc Quad  - 1 x daily - 7 x weekly - 3 sets - 10 reps - Seated Heel  Toe Raises  - 1 x daily - 7 x weekly - 3 sets - 10 reps  You Can Walk For A Certain Length Of Time Each Day                          Walk 1 minute 2 times per day.             Increase 1  minute every 4 days              Work up to 5 minutes (1-2 times per day).           GOALS: Goals reviewed with patient? Yes   SHORT TERM GOALS= LTG based on initial POC   LONG TERM GOALS: Target date: 05/05/2022   Pt will complete final HEP with assist from wife as needed for improved functional strength  Baseline: to be provided Goal status: INITIAL   2.  Pt will improve 5x STS to </= 50 sec to demo improved functional LE strength and balance    Baseline: 66s with B UE + CGA and RW Goal status: INITIAL   3.  Pt will improve TUG to </= 100 secs to demonstrated reduced fall risk   Baseline: 127s with RW + CGA Goal status: INITIAL     ASSESSMENT:   CLINICAL IMPRESSION: Patient seen for skilled PT session with emphasis on HEP establishment and further assessment and any visual impairments. Due to patients poor ability to follow simple single step cues or simple 2 step cues- unable to gather formal assessment, but it does appear as though he has a L inferior quadrant field cut. Patient and wife deny patient walking into obstacles on L side. Patient sudden onset of restlessness with choreic movement: BP assessed to be 145/101. Patient denying need to use the bathroom, but wife taking patient. BP assessed afterward: 132/65. He remained with significant difficulty follow basic cues. Wife continuing to voice frustration about patient not completing any exercise or walking at home. PT reiterating that this therapist cannot force patient to complete exercises/walk at home. Attempted to educate patient on importance of moving- patient did not seem receptive. PT encouraging patient to continue to participate in [place] program at [facility]. Provided patient with HEP and walking program. PT also providing  wife with info on [facility]. Continue POC.      OBJECTIVE IMPAIRMENTS Abnormal gait, cardiopulmonary status limiting activity, decreased activity tolerance, decreased balance, decreased cognition, decreased coordination, decreased endurance, decreased knowledge of condition, decreased knowledge of use of DME, difficulty walking, decreased safety awareness, and impaired perceived functional ability.    ACTIVITY LIMITATIONS carrying, lifting, bending, standing, stairs, transfers, bed mobility, self feeding, and locomotion level   PARTICIPATION LIMITATIONS: cleaning, interpersonal relationship, driving, shopping, and community activity   PERSONAL FACTORS Age, Behavior pattern, Past/current experiences, Social background, Time since onset of injury/illness/exacerbation, and 1-2 comorbidities: previous CVA, pneumonia   are also affecting patient's functional outcome.    REHAB POTENTIAL: Fair  time since onset, poor motivation reported   CLINICAL DECISION MAKING: Evolving/moderate complexity   EVALUATION COMPLEXITY: Moderate   PLAN: PT FREQUENCY: 2x/week   PT DURATION: 4 weeks   PLANNED INTERVENTIONS: Therapeutic exercises, Therapeutic activity, Neuromuscular re-education, Balance training, Gait training, Patient/Family education, Self Care, Joint mobilization, Stair training, Vestibular training, Visual/preceptual remediation/compensation, Orthotic/Fit training, DME instructions, Aquatic Therapy, Cognitive remediation, Electrical stimulation, Wheelchair mobility training, Manual therapy, and Re-evaluation   PLAN FOR NEXT SESSION: re-assess vision?, how's mobility at home going?, gait speed, endurance       Debbora Dus, PT Debbora Dus, PT, DPT, [name]  04/10/2022, 3:40 PM

## 2022-04-12 ENCOUNTER — Ambulatory Visit: Payer: Medicare Other

## 2022-04-12 DIAGNOSIS — M6281 Muscle weakness (generalized): Secondary | ICD-10-CM

## 2022-04-12 DIAGNOSIS — R278 Other lack of coordination: Secondary | ICD-10-CM | POA: Diagnosis not present

## 2022-04-12 DIAGNOSIS — R2681 Unsteadiness on feet: Secondary | ICD-10-CM

## 2022-04-12 DIAGNOSIS — R2689 Other abnormalities of gait and mobility: Secondary | ICD-10-CM

## 2022-04-12 NOTE — Therapy (Unsigned)
OUTPATIENT PHYSICAL THERAPY TREATMENT NOTE   Patient Name: Joe Perry. MRN: [id number] DOB:Sep 26, 1950, 71 y.o., male Today's Date: 04/12/2022  PCP: Elsie Stain, MD REFERRING PROVIDER: Elsie Stain, MD  END OF SESSION:   PT End of Session - 04/12/22 1445     Visit Number 3    Number of Visits 9    Date for PT Re-Evaluation 05/05/22    Authorization Type UHC Medicare    PT Start Time 1856    PT Stop Time 3149    PT Time Calculation (min) 45 min    Equipment Utilized During Treatment Gait belt    Activity Tolerance Patient limited by fatigue    Behavior During Therapy [facility] for tasks assessed/performed;Flat affect             Past Medical History:  Diagnosis Date   Acute metabolic encephalopathy 03/05/6377   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 ([place]) 06/03/2019   Cerebrovascular accident (CVA) due to occlusion of vertebral artery ([name])    Diabetes mellitus without complication ([place])    Dysphagia    post stroke   Hypertension    Left upper lobe pneumonia 02/15/2021   Stroke [place])    Past Surgical History:  Procedure Laterality Date   IR ANGIO INTRA EXTRACRAN SEL COM CAROTID INNOMINATE UNI L MOD SED  03/10/2019   IR ANGIO VERTEBRAL SEL SUBCLAVIAN INNOMINATE BILAT MOD SED  03/10/2019   IR CT HEAD LTD  03/10/2019   IR PERCUTANEOUS ART THROMBECTOMY/INFUSION INTRACRANIAL INC DIAG ANGIO  03/10/2019   RADIOLOGY WITH ANESTHESIA N/A 03/10/2019   Procedure: IR WITH ANESTHESIA/CODE STROKE;  Surgeon: Radiologist, Medication, MD;  Location: [place];  Service: Radiology;  Laterality: N/A;   Patient Active Problem List   Diagnosis Date Noted   Obesity (BMI 30.0-34.9) 03/27/2022   Left hemiparesis ([place]) 12/06/2020   Benign essential HTN    Cognitive deficit, post-stroke    Controlled type 2 diabetes mellitus with hyperglycemia, without long-term current use of insulin ([name])    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   Dyslipidemia, goal LDL below 70 03/12/2019   Dysphagia due to recent stroke 03/12/2019   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, d/t large vessel dz 03/10/2019   Occlusion of right posterior communicating artery 03/10/2019   Gait disturbance, post-stroke    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    REFERRING DIAG: G81.94 (ICD-10-CM) - Left hemiparesis ([place])  THERAPY DIAG:  Other abnormalities of gait and mobility  Muscle weakness (generalized)  Other lack of coordination  Unsteadiness on feet  Rationale for Evaluation and Treatment Rehabilitation  PERTINENT HISTORY: CVA in 2020  PRECAUTIONS: fall, L hemi, thickened liquid recommendation  SUBJECTIVE: Patient reports doing well. He states he walks "5 miles," but per wife- walking ~55f 4x/day. Did do his exercises yesterday.   PAIN:  Are you having pain? No  BP: 113/71 seated  123/83 standing   TODAY'S TREATMENT:  NMR: -gait speed: .015m  NuStep x8 mins B UE/LE  100' RW + CGA + assist managing RW   PATIENT EDUCATION: Education details: PT POC, importance of moving outside of planned PT time, HEP  Person educated: Patient and Spouse Education method: Explanation Education comprehension: verbalized understanding and needs further education     HOME EXERCISE PROGRAM: Access Code: [email or web address]: [email or web address] Date: 04/10/2022 Prepared by: JeEstevan RyderExercises - Sit to Stand with Counter Support  - 1 x daily - 7  x weekly - 3 sets - 10 reps - Seated Long Arc Quad  - 1 x daily - 7 x weekly - 3 sets - 10 reps - Seated Heel Toe Raises  - 1 x daily - 7 x weekly - 3 sets - 10 reps  You Can Walk For A Certain Length Of Time Each Day                          Walk 1 minute 2 times per day.             Increase 1  minute every 4 days              Work up to 5 minutes (1-2 times per day).           GOALS: Goals reviewed with patient? Yes   SHORT TERM  GOALS= LTG based on initial POC   LONG TERM GOALS: Target date: 05/05/2022   Pt will complete final HEP with assist from wife as needed for improved functional strength  Baseline: to be provided Goal status: INITIAL   2.  Pt will improve 5x STS to </= 50 sec to demo improved functional LE strength and balance    Baseline: 66s with B UE + CGA and RW Goal status: INITIAL   3.  Pt will improve TUG to </= 100 secs to demonstrated reduced fall risk   Baseline: 127s with RW + CGA Goal status: INITIAL  4. Pt will improve gait speed to >/= .46ms to demonstrate improved community ambulation  Baseline: .062m   Goal status: NEW       ASSESSMENT:   CLINICAL IMPRESSION: Patient seen for skilled PT session with emphasis on gait re-training with emphasis on increasing stride length and gait speed. 10 Meter Walk Test: Patient instructed to walk 10 meters (32.8 ft) as quickly and as safely as possible at their normal speed x2 and at a fast speed x2. Time measured from 2 meter mark to 8 meter mark to accommodate ramp-up and ramp-down.  Normal speed: .0791m Cut off scores: <0.4 m/s = household Ambulator, 0.4-0.8 m/s = limited community Ambulator, >0.8 m/s = community Ambulator, >1.2 m/s = crossing a street, <1.0 = increased fall risk MCID 0.05 m/s (small), 0.13 m/s (moderate), 0.06 m/s (significant)  (ANPTA Core Set of Outcome Measures for Adults with Neurologic Conditions, 2018). Patient with a significantly slower than typical gait speed with decreased L foot clearance and step length noted as well. He is minimally receptive to verbal cuing as he is limited in his ability to follow simple, single step cues at all times. Wife confirms that he is moving more at home. Continue POC.       OBJECTIVE IMPAIRMENTS Abnormal gait, cardiopulmonary status limiting activity, decreased activity tolerance, decreased balance, decreased cognition, decreased coordination, decreased endurance, decreased knowledge of  condition, decreased knowledge of use of DME, difficulty walking, decreased safety awareness, and impaired perceived functional ability.    ACTIVITY LIMITATIONS carrying, lifting, bending, standing, stairs, transfers, bed mobility, self feeding, and locomotion level   PARTICIPATION LIMITATIONS: cleaning, interpersonal relationship, driving, shopping, and community activity   PERSONAL FACTORS Age, Behavior pattern, Past/current experiences, Social background, Time since onset of injury/illness/exacerbation, and 1-2 comorbidities: previous CVA, pneumonia   are also affecting patient's functional outcome.    REHAB POTENTIAL: Fair time since onset, poor motivation reported   CLINICAL DECISION MAKING: Evolving/moderate complexity   EVALUATION COMPLEXITY: Moderate  PLAN: PT FREQUENCY: 2x/week   PT DURATION: 4 weeks   PLANNED INTERVENTIONS: Therapeutic exercises, Therapeutic activity, Neuromuscular re-education, Balance training, Gait training, Patient/Family education, Self Care, Joint mobilization, Stair training, Vestibular training, Visual/preceptual remediation/compensation, Orthotic/Fit training, DME instructions, Aquatic Therapy, Cognitive remediation, Electrical stimulation, Wheelchair mobility training, Manual therapy, and Re-evaluation   PLAN FOR NEXT SESSION: re-assess vision?, how's mobility at home going?, gait speed, endurance       Debbora Dus, PT Debbora Dus, PT, DPT, [name]  04/12/2022, 4:14 PM

## 2022-04-18 ENCOUNTER — Ambulatory Visit: Payer: Medicare Other | Admitting: Physical Therapy

## 2022-04-18 ENCOUNTER — Encounter: Payer: Self-pay | Admitting: Physical Therapy

## 2022-04-18 DIAGNOSIS — M6281 Muscle weakness (generalized): Secondary | ICD-10-CM

## 2022-04-18 DIAGNOSIS — R2681 Unsteadiness on feet: Secondary | ICD-10-CM

## 2022-04-18 DIAGNOSIS — R2689 Other abnormalities of gait and mobility: Secondary | ICD-10-CM

## 2022-04-18 DIAGNOSIS — R278 Other lack of coordination: Secondary | ICD-10-CM | POA: Diagnosis not present

## 2022-04-18 NOTE — Therapy (Signed)
OUTPATIENT PHYSICAL THERAPY TREATMENT NOTE   Patient Name: Joe NIFONG Sr. MRN: [id number] DOB:1951/06/27, 71 y.o., male Today's Date: 04/18/2022  PCP: Elsie Stain, MD REFERRING PROVIDER: Elsie Stain, MD  END OF SESSION:   PT End of Session - 04/18/22 1453     Visit Number 4    Number of Visits 9    Date for PT Re-Evaluation 05/05/22    Authorization Type UHC Medicare    PT Start Time 1450    PT Stop Time 2119    PT Time Calculation (min) 48 min    Equipment Utilized During Treatment Gait belt    Activity Tolerance Patient limited by fatigue;Patient tolerated treatment well    Behavior During Therapy [facility] for tasks assessed/performed;Flat affect             Past Medical History:  Diagnosis Date   Acute metabolic encephalopathy 12/20/4079   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 ([place]) 06/03/2019   Cerebrovascular accident (CVA) due to occlusion of vertebral artery ([name])    Diabetes mellitus without complication ([place])    Dysphagia    post stroke   Hypertension    Left upper lobe pneumonia 02/15/2021   Stroke [facility])    Past Surgical History:  Procedure Laterality Date   IR ANGIO INTRA EXTRACRAN SEL COM CAROTID INNOMINATE UNI L MOD SED  03/10/2019   IR ANGIO VERTEBRAL SEL SUBCLAVIAN INNOMINATE BILAT MOD SED  03/10/2019   IR CT HEAD LTD  03/10/2019   IR PERCUTANEOUS ART THROMBECTOMY/INFUSION INTRACRANIAL INC DIAG ANGIO  03/10/2019   RADIOLOGY WITH ANESTHESIA N/A 03/10/2019   Procedure: IR WITH ANESTHESIA/CODE STROKE;  Surgeon: Radiologist, Medication, MD;  Location: [facility];  Service: Radiology;  Laterality: N/A;   Patient Active Problem List   Diagnosis Date Noted   Obesity (BMI 30.0-34.9) 03/27/2022   Left hemiparesis ([place]) 12/06/2020   Benign essential HTN    Cognitive deficit, post-stroke    Controlled type 2 diabetes mellitus with hyperglycemia, without long-term current use of insulin ([name])    Vestibular  schwannoma ([place]) 06/03/2019   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   Dyslipidemia, goal LDL below 70 03/12/2019   Dysphagia due to recent stroke 03/12/2019   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, d/t large vessel dz 03/10/2019   Occlusion of right posterior communicating artery 03/10/2019   Gait disturbance, post-stroke    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    REFERRING DIAG: G81.94 (ICD-10-CM) - Left hemiparesis ([place])  THERAPY DIAG:  Other abnormalities of gait and mobility  Muscle weakness (generalized)  Unsteadiness on feet  Rationale for Evaluation and Treatment Rehabilitation  PERTINENT HISTORY: CVA in 2020  PRECAUTIONS: fall, L hemi, thickened liquid recommendation  VITALS: BP before session 117/64, hr 67   SUBJECTIVE: No new complaints. Reports HEP is going well at home.   PAIN:  Are you having pain? No   TODAY'S TREATMENT  STRENGTHENING   Seated EOM for the following ex's with cues on form/technique with all of them, x 10 reps each bil LE's:  - with 2# ankle weights: heel<>toe raises, LAQs, and marching. Cues for full extension with long arc quads and use of PTA hand for target to increased hip flexion with marching.   - with red band resistance: heel slides and clamshells with cues assistance needed at times for control and to got within full range   Scifit UE/LE's level 3.0 x 8 minutes with goal >/= 70 steps per minute for strengthening,  working on reciprocal movements and activity tolerance    GAIT: Gait pattern: step to pattern, step through pattern, decreased step length- Left, decreased stance time- Right, decreased stride length, and trunk flexed Distance walked: 230 X 1 Assistive device utilized: Walker - 2 wheeled Level of assistance: CGA and Min A Comments: PTA providing momentum to walker to work on increased bil step length and gait speed with cues at times for increased step length on left LE as foot tends to catch at times. Pt able to  improve reciprocal gait pattern with improved cadence with PTA pushing walker. Minimal carry over when PTA stopped facilitation walker for short distance from mat>Scifit and no carryover noted from Scifit>mat table.       PATIENT EDUCATION: Education details: PT POC, importance of moving outside of planned PT time, HEP  Person educated: Patient and Spouse Education method: Explanation Education comprehension: verbalized understanding and needs further education      HOME EXERCISE PROGRAM: Access Code: [email or web address] URL: [email or web address] Date: 04/10/2022 Prepared by: Estevan Ryder  Exercises - Sit to Stand with Counter Support  - 1 x daily - 7 x weekly - 3 sets - 10 reps - Seated Long Arc Quad  - 1 x daily - 7 x weekly - 3 sets - 10 reps - Seated Heel Toe Raises  - 1 x daily - 7 x weekly - 3 sets - 10 reps  You Can Walk For A Certain Length Of Time Each Day                          Walk 1 minute 2 times per day.             Increase 1  minute every 4 days              Work up to 5 minutes (1-2 times per day).           GOALS: Goals reviewed with patient? Yes   SHORT TERM GOALS= LTG based on initial POC   LONG TERM GOALS: Target date: 05/05/2022   Pt will complete final HEP with assist from wife as needed for improved functional strength  Baseline: to be provided Goal status: INITIAL   2.  Pt will improve 5x STS to </= 50 sec to demo improved functional LE strength and balance  Baseline: 66s with B UE + CGA and RW Goal status: INITIAL   3.  Pt will improve TUG to </= 100 secs to demonstrated reduced fall risk Baseline: 127s with RW + CGA Goal status: INITIAL  4. Pt will improve gait speed to >/= .17ms to demonstrate improved community ambulation  Baseline: .014m   Goal status: NEW       ASSESSMENT:   CLINICAL IMPRESSION: Today's skilled session continued to focus on strengthening and gait mechanics. No issues noted or reported in session. The pt  is making slow progress toward goals and will continue to focus on progressing toward goals as able.      OBJECTIVE IMPAIRMENTS Abnormal gait, cardiopulmonary status limiting activity, decreased activity tolerance, decreased balance, decreased cognition, decreased coordination, decreased endurance, decreased knowledge of condition, decreased knowledge of use of DME, difficulty walking, decreased safety awareness, and impaired perceived functional ability.    ACTIVITY LIMITATIONS carrying, lifting, bending, standing, stairs, transfers, bed mobility, self feeding, and locomotion level   PARTICIPATION LIMITATIONS: cleaning, interpersonal relationship, driving, shopping, and community activity   PERSONAL FACTORS Age, Behavior pattern, Past/current  experiences, Social background, Time since onset of injury/illness/exacerbation, and 1-2 comorbidities: previous CVA, pneumonia   are also affecting patient's functional outcome.    REHAB POTENTIAL: Fair time since onset, poor motivation reported   CLINICAL DECISION MAKING: Evolving/moderate complexity   EVALUATION COMPLEXITY: Moderate   PLAN: PT FREQUENCY: 2x/week   PT DURATION: 4 weeks   PLANNED INTERVENTIONS: Therapeutic exercises, Therapeutic activity, Neuromuscular re-education, Balance training, Gait training, Patient/Family education, Self Care, Joint mobilization, Stair training, Vestibular training, Visual/preceptual remediation/compensation, Orthotic/Fit training, DME instructions, Aquatic Therapy, Cognitive remediation, Electrical stimulation, Wheelchair mobility training, Manual therapy, and Re-evaluation   PLAN FOR NEXT SESSION:  re-assess vision?, how's mobility at home going?, gait speed, endurance       Willow Ora, PTA, [facility] [address], [place], [place] [address] [phone] 04/18/22, 7:45 PM

## 2022-04-20 ENCOUNTER — Encounter: Payer: Self-pay | Admitting: Physical Therapy

## 2022-04-20 ENCOUNTER — Ambulatory Visit: Payer: Medicare Other | Admitting: Physical Therapy

## 2022-04-20 DIAGNOSIS — R2689 Other abnormalities of gait and mobility: Secondary | ICD-10-CM

## 2022-04-20 DIAGNOSIS — R278 Other lack of coordination: Secondary | ICD-10-CM | POA: Diagnosis not present

## 2022-04-20 DIAGNOSIS — M6281 Muscle weakness (generalized): Secondary | ICD-10-CM

## 2022-04-20 DIAGNOSIS — R2681 Unsteadiness on feet: Secondary | ICD-10-CM

## 2022-04-20 NOTE — Therapy (Signed)
OUTPATIENT PHYSICAL THERAPY TREATMENT NOTE   Patient Name: Joe Perry. MRN: [id number] DOB:[birth year], 71 y.o., male Today's Date: 04/21/2022  PCP: Elsie Stain, MD REFERRING PROVIDER: Elsie Stain, MD  END OF SESSION:   PT End of Session - 04/20/22 1408     Visit Number 5    Number of Visits 9    Date for PT Re-Evaluation 05/05/22    Authorization Type UHC Medicare    PT Start Time 2725    PT Stop Time 1444    PT Time Calculation (min) 40 min    Equipment Utilized During Treatment Gait belt    Activity Tolerance Patient limited by fatigue;Patient tolerated treatment well    Behavior During Therapy [facility] for tasks assessed/performed;Flat affect             Past Medical History:  Diagnosis Date   Acute metabolic encephalopathy 3/66/4403   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 ([place]) 06/03/2019   Cerebrovascular accident (CVA) due to occlusion of vertebral artery ([name])    Diabetes mellitus without complication ([place])    Dysphagia    post stroke   Hypertension    Left upper lobe pneumonia 02/15/2021   Stroke [facility])    Past Surgical History:  Procedure Laterality Date   IR ANGIO INTRA EXTRACRAN SEL COM CAROTID INNOMINATE UNI L MOD SED  03/10/2019   IR ANGIO VERTEBRAL SEL SUBCLAVIAN INNOMINATE BILAT MOD SED  03/10/2019   IR CT HEAD LTD  03/10/2019   IR PERCUTANEOUS ART THROMBECTOMY/INFUSION INTRACRANIAL INC DIAG ANGIO  03/10/2019   RADIOLOGY WITH ANESTHESIA N/A 03/10/2019   Procedure: IR WITH ANESTHESIA/CODE STROKE;  Surgeon: Radiologist, Medication, MD;  Location: [facility];  Service: Radiology;  Laterality: N/A;   Patient Active Problem List   Diagnosis Date Noted   Obesity (BMI 30.0-34.9) 03/27/2022   Left hemiparesis ([place]) 12/06/2020   Benign essential HTN    Cognitive deficit, post-stroke    Controlled type 2 diabetes mellitus with hyperglycemia, without long-term current use of insulin ([name])    Vestibular  schwannoma ([place]) 06/03/2019   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   Dyslipidemia, goal LDL below 70 03/12/2019   Dysphagia due to recent stroke 03/12/2019   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, d/t large vessel dz 03/10/2019   Occlusion of right posterior communicating artery 03/10/2019   Gait disturbance, post-stroke    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    REFERRING DIAG: G81.94 (ICD-10-CM) - Left hemiparesis ([place])  THERAPY DIAG:  Other abnormalities of gait and mobility  Muscle weakness (generalized)  Unsteadiness on feet  Rationale for Evaluation and Treatment Rehabilitation  PERTINENT HISTORY: CVA in 2020  PRECAUTIONS: fall, L hemi, thickened liquid recommendation  VITALS: BP before session 117/64, hr 67   SUBJECTIVE: No new complaints. Felt okay after last session. No pain.   PAIN:  Are you having pain? No   TODAY'S TREATMENT  STRENGTHENING   Scifit UE/LE's level 4.3 x 8 minutes with goal >/= 70 steps per minute for strengthening, working on reciprocal movements and activity tolerance. Cues for forward gaze/head up and to go through full range of motion.     GAIT: Gait pattern: step to pattern, step through pattern, decreased step length- Left, decreased stance time- Right, decreased stride length, and trunk flexed Distance walked:  x 1, 115 X with RW, 115 x 3 with rollator Assistive device utilized: Walker - 2 wheeled Level of assistance: CGA and Min A Comments: PTA providing momentum  to walker to work on increased bil step length and gait speed with cues at times for increased step length on left LE as foot tends to catch at times. Pt able to improve reciprocal gait pattern with improved cadence with PTA pushing walker. Pt with personal walker today with skies on back vs tennis balls and increased friction noted from heavy UE reliance on walker. Trialed rollator as pt has one at home to see if pt's gait speed and quality improved with this device vs RW.  Pt noted to veer toward left significantly needing assist/cues to stear forward/toward right. This improved on 2cd and 3rd laps with less cues/assistance needed each time.       PATIENT EDUCATION: Education details: continue with current HEP, keep moving more at home Patient and Spouse Education method: Explanation Education comprehension: verbalized understanding and needs further education      HOME EXERCISE PROGRAM: Access Code: [email or web address] URL: [email or web address] Date: 04/10/2022 Prepared by: Estevan Ryder  Exercises - Sit to Stand with Counter Support  - 1 x daily - 7 x weekly - 3 sets - 10 reps - Seated Long Arc Quad  - 1 x daily - 7 x weekly - 3 sets - 10 reps - Seated Heel Toe Raises  - 1 x daily - 7 x weekly - 3 sets - 10 reps  You Can Walk For A Certain Length Of Time Each Day                          Walk 1 minute 2 times per day.             Increase 1  minute every 4 days              Work up to 5 minutes (1-2 times per day).           GOALS: Goals reviewed with patient? Yes   SHORT TERM GOALS= LTG based on initial POC   LONG TERM GOALS: Target date: 05/05/2022   Pt will complete final HEP with assist from wife as needed for improved functional strength  Baseline: to be provided Goal status: INITIAL   2.  Pt will improve 5x STS to </= 50 sec to demo improved functional LE strength and balance  Baseline: 66s with B UE + CGA and RW Goal status: INITIAL   3.  Pt will improve TUG to </= 100 secs to demonstrated reduced fall risk Baseline: 127s with RW + CGA Goal status: INITIAL  4. Pt will improve gait speed to >/= .42ms to demonstrate improved community ambulation  Baseline: .020m   Goal status: NEW       ASSESSMENT:   CLINICAL IMPRESSION: Today's skilled session continued to focus on strengthening and gait training working towards increased step/stride length and increased gait speed. Trialed use of rollator today as spouse reports  he has one at home. Pt initially with significant left veering. This improved with cues and repetition of use in session. Pt would benefit from continued training with rollator to assess for any carryover with safety as it does improve his gait mechanics. No issues noted or reported in session. The pt is making steady progress toward goals and should benefit from continued PT to progress toward unmet goals.      OBJECTIVE IMPAIRMENTS Abnormal gait, cardiopulmonary status limiting activity, decreased activity tolerance, decreased balance, decreased cognition, decreased coordination, decreased endurance, decreased knowledge of condition, decreased knowledge of use of  DME, difficulty walking, decreased safety awareness, and impaired perceived functional ability.    ACTIVITY LIMITATIONS carrying, lifting, bending, standing, stairs, transfers, bed mobility, self feeding, and locomotion level   PARTICIPATION LIMITATIONS: cleaning, interpersonal relationship, driving, shopping, and community activity   PERSONAL FACTORS Age, Behavior pattern, Past/current experiences, Social background, Time since onset of injury/illness/exacerbation, and 1-2 comorbidities: previous CVA, pneumonia   are also affecting patient's functional outcome.    REHAB POTENTIAL: Fair time since onset, poor motivation reported   CLINICAL DECISION MAKING: Evolving/moderate complexity   EVALUATION COMPLEXITY: Moderate   PLAN: PT FREQUENCY: 2x/week   PT DURATION: 4 weeks   PLANNED INTERVENTIONS: Therapeutic exercises, Therapeutic activity, Neuromuscular re-education, Balance training, Gait training, Patient/Family education, Self Care, Joint mobilization, Stair training, Vestibular training, Visual/preceptual remediation/compensation, Orthotic/Fit training, DME instructions, Aquatic Therapy, Cognitive remediation, Electrical stimulation, Wheelchair mobility training, Manual therapy, and Re-evaluation   PLAN FOR NEXT SESSION:   re-assess vision?, continue to trial rollator with gait working on more attention to left side as pt tends to hit/run into things, LE strengthening, activity tolerance      Willow Ora, PTA, [place] [address] [place], [place] [address] [phone] 04/21/22, 11:36 AM

## 2022-04-24 ENCOUNTER — Telehealth: Payer: Medicare Other | Admitting: Critical Care Medicine

## 2022-04-25 ENCOUNTER — Ambulatory Visit: Payer: Medicare Other

## 2022-04-25 DIAGNOSIS — R2681 Unsteadiness on feet: Secondary | ICD-10-CM

## 2022-04-25 DIAGNOSIS — R278 Other lack of coordination: Secondary | ICD-10-CM | POA: Diagnosis not present

## 2022-04-25 DIAGNOSIS — R2689 Other abnormalities of gait and mobility: Secondary | ICD-10-CM

## 2022-04-25 DIAGNOSIS — M6281 Muscle weakness (generalized): Secondary | ICD-10-CM

## 2022-04-25 NOTE — Therapy (Signed)
OUTPATIENT PHYSICAL THERAPY TREATMENT NOTE   Patient Name: Joe NORDIN Sr. MRN: [id number] DOB:26-Jul-1951, 71 y.o., male Today's Date: 04/25/2022  PCP: Elsie Stain, MD REFERRING PROVIDER: Elsie Stain, MD  END OF SESSION:   PT End of Session - 04/25/22 1405     Visit Number 6    Number of Visits 9    Date for PT Re-Evaluation 05/05/22    Authorization Type UHC Medicare    PT Start Time 1400    PT Stop Time 1441    PT Time Calculation (min) 41 min    Equipment Utilized During Treatment Gait belt    Activity Tolerance Patient limited by fatigue;Patient tolerated treatment well    Behavior During Therapy [facility] for tasks assessed/performed;Flat affect             Past Medical History:  Diagnosis Date   Acute metabolic encephalopathy 9/37/9024   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 ([place]) 06/03/2019   Cerebrovascular accident (CVA) due to occlusion of vertebral artery ([name])    Diabetes mellitus without complication ([place])    Dysphagia    post stroke   Hypertension    Left upper lobe pneumonia 02/15/2021   Stroke [facility])    Past Surgical History:  Procedure Laterality Date   IR ANGIO INTRA EXTRACRAN SEL COM CAROTID INNOMINATE UNI L MOD SED  03/10/2019   IR ANGIO VERTEBRAL SEL SUBCLAVIAN INNOMINATE BILAT MOD SED  03/10/2019   IR CT HEAD LTD  03/10/2019   IR PERCUTANEOUS ART THROMBECTOMY/INFUSION INTRACRANIAL INC DIAG ANGIO  03/10/2019   RADIOLOGY WITH ANESTHESIA N/A 03/10/2019   Procedure: IR WITH ANESTHESIA/CODE STROKE;  Surgeon: Radiologist, Medication, MD;  Location: [place];  Service: Radiology;  Laterality: N/A;   Patient Active Problem List   Diagnosis Date Noted   Obesity (BMI 30.0-34.9) 03/27/2022   Left hemiparesis ([place]) 12/06/2020   Benign essential HTN    Cognitive deficit, post-stroke    Controlled type 2 diabetes mellitus with hyperglycemia, without long-term current use of insulin ([name])    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   Dyslipidemia, goal LDL below 70 03/12/2019   Dysphagia due to recent stroke 03/12/2019   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, d/t large vessel dz 03/10/2019   Occlusion of right posterior communicating artery 03/10/2019   Gait disturbance, post-stroke    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    REFERRING DIAG: G81.94 (ICD-10-CM) - Left hemiparesis ([place])  THERAPY DIAG:  Other abnormalities of gait and mobility  Muscle weakness (generalized)  Unsteadiness on feet  Other lack of coordination  Rationale for Evaluation and Treatment Rehabilitation  PERTINENT HISTORY: CVA in 2020  PRECAUTIONS: fall, L hemi, thickened liquid recommendation  VITALS: BP before session 117/64, hr 67   SUBJECTIVE: Patient reports doing well. He states that he walks ~20f at a time, but per wife- he walks ~348fat a time. No falls/near falls. Unable to attend community gym classes due to car issues.   PAIN:  Are you having pain? No   TODAY'S TREATMENT  NMR:  -NuStep B LE/UE level 3 x10 mins with goal of steps/min >60    GAIT: -~7572fith RW + supervision (taking ~77m3m  -pregait: stepping over flat ruler for increased L foot clearance and step length (minimal to no carryover noted with 115' gait bout) -PT provided patient with tennis balls to posterior struts of RW to assist with increasing walkers ability to roll (minimal  improvement noted because of how much weight patient puts through walker)  -Patient ambulating 10' in 47.74s   PATIENT EDUCATION: Education details: continue HEP, moving at home  Patient and Spouse Education method: Explanation Education comprehension: verbalized understanding and needs further education   HOME EXERCISE PROGRAM: Access Code: [email or web address] URL: [email or web address] Date: 04/10/2022 Prepared by: Estevan Ryder  Exercises - Sit to Stand with Counter Support  - 1 x  daily - 7 x weekly - 3 sets - 10 reps - Seated Long Arc Quad  - 1 x daily - 7 x weekly - 3 sets - 10 reps - Seated Heel Toe Raises  - 1 x daily - 7 x weekly - 3 sets - 10 reps  You Can Walk For A Certain Length Of Time Each Day                          Walk 1 minute 2 times per day.             Increase 1  minute every 4 days              Work up to 5 minutes (1-2 times per day).           GOALS: Goals reviewed with patient? Yes   SHORT TERM GOALS= LTG based on initial POC   LONG TERM GOALS: Target date: 05/05/2022   Pt will complete final HEP with assist from wife as needed for improved functional strength  Baseline: to be provided Goal status: INITIAL   2.  Pt will improve 5x STS to </= 50 sec to demo improved functional LE strength and balance  Baseline: 66s with B UE + CGA and RW Goal status: INITIAL   3.  Pt will improve TUG to </= 100 secs to demonstrated reduced fall risk Baseline: 127s with RW + CGA Goal status: INITIAL  4. Pt will improve gait speed to >/= .2ms to demonstrate improved community ambulation  Baseline: .042m   Goal status: NEW       ASSESSMENT:   CLINICAL IMPRESSION: Patient seen for skilled PT session with emphasis on gait retraining with RW. He remains with nonfunctional gait speed, taking 47.74seconds to ambulate 1058fespite verbal cues and multiple interventions targeted at increasing gait speed. L LE noted with poor foot clearance, variable step length and ER. Due to patients cognitive deficits at baseline, high distractibility in busy gym and short working memory, patient with very poor carryover. Continue POC as able.    OBJECTIVE IMPAIRMENTS Abnormal gait, cardiopulmonary status limiting activity, decreased activity tolerance, decreased balance, decreased cognition, decreased coordination, decreased endurance, decreased knowledge of condition, decreased knowledge of use of DME, difficulty walking, decreased safety awareness, and impaired  perceived functional ability.    ACTIVITY LIMITATIONS carrying, lifting, bending, standing, stairs, transfers, bed mobility, self feeding, and locomotion level   PARTICIPATION LIMITATIONS: cleaning, interpersonal relationship, driving, shopping, and community activity   PERSONAL FACTORS Age, Behavior pattern, Past/current experiences, Social background, Time since onset of injury/illness/exacerbation, and 1-2 comorbidities: previous CVA, pneumonia   are also affecting patient's functional outcome.    REHAB POTENTIAL: Fair time since onset, poor motivation reported   CLINICAL DECISION MAKING: Evolving/moderate complexity   EVALUATION COMPLEXITY: Moderate   PLAN: PT FREQUENCY: 2x/week   PT DURATION: 4 weeks   PLANNED INTERVENTIONS: Therapeutic exercises, Therapeutic activity, Neuromuscular re-education, Balance training, Gait training, Patient/Family education, Self Care, Joint mobilization, Stair training, Vestibular training, Visual/preceptual remediation/compensation, Orthotic/Fit  training, DME instructions, Aquatic Therapy, Cognitive remediation, Electrical stimulation, Wheelchair mobility training, Manual therapy, and Re-evaluation   PLAN FOR NEXT SESSION:  re-assess vision?, continue to trial rollator with gait working on more attention to left side as pt tends to hit/run into things, LE strengthening, activity tolerance      Debbora Dus, PT, DPT, [name]  04/25/22, 2:41 PM

## 2022-04-27 ENCOUNTER — Ambulatory Visit: Payer: Medicare Other | Admitting: Physical Therapy

## 2022-04-27 ENCOUNTER — Encounter: Payer: Self-pay | Admitting: Physical Therapy

## 2022-04-27 DIAGNOSIS — R278 Other lack of coordination: Secondary | ICD-10-CM | POA: Diagnosis not present

## 2022-04-27 DIAGNOSIS — R2689 Other abnormalities of gait and mobility: Secondary | ICD-10-CM

## 2022-04-27 DIAGNOSIS — M6281 Muscle weakness (generalized): Secondary | ICD-10-CM

## 2022-04-27 DIAGNOSIS — R2681 Unsteadiness on feet: Secondary | ICD-10-CM

## 2022-04-27 NOTE — Therapy (Signed)
OUTPATIENT PHYSICAL THERAPY TREATMENT NOTE   Patient Name: Joe BRANDENBURG Sr. MRN: [id number] DOB:1951/07/28, 71 y.o., male Today's Date: 04/27/2022  PCP: Elsie Stain, MD REFERRING PROVIDER: Elsie Stain, MD  END OF SESSION:   PT End of Session - 04/27/22 1354     Visit Number 7    Number of Visits 9    Date for PT Re-Evaluation 05/05/22    Authorization Type UHC Medicare    PT Start Time 1401    PT Stop Time 1448    PT Time Calculation (min) 47 min    Equipment Utilized During Treatment Gait belt    Activity Tolerance Patient limited by fatigue;Patient tolerated treatment well    Behavior During Therapy [facility] for tasks assessed/performed;Flat affect             Past Medical History:  Diagnosis Date   Acute metabolic encephalopathy 2/40/9735   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 ([place]) 06/03/2019   Cerebrovascular accident (CVA) due to occlusion of vertebral artery ([name])    Diabetes mellitus without complication ([place])    Dysphagia    post stroke   Hypertension    Left upper lobe pneumonia 02/15/2021   Stroke [facility])    Past Surgical History:  Procedure Laterality Date   IR ANGIO INTRA EXTRACRAN SEL COM CAROTID INNOMINATE UNI L MOD SED  03/10/2019   IR ANGIO VERTEBRAL SEL SUBCLAVIAN INNOMINATE BILAT MOD SED  03/10/2019   IR CT HEAD LTD  03/10/2019   IR PERCUTANEOUS ART THROMBECTOMY/INFUSION INTRACRANIAL INC DIAG ANGIO  03/10/2019   RADIOLOGY WITH ANESTHESIA N/A 03/10/2019   Procedure: IR WITH ANESTHESIA/CODE STROKE;  Surgeon: Radiologist, Medication, MD;  Location: [place];  Service: Radiology;  Laterality: N/A;   Patient Active Problem List   Diagnosis Date Noted   Obesity (BMI 30.0-34.9) 03/27/2022   Left hemiparesis ([place]) 12/06/2020   Benign essential HTN    Cognitive deficit, post-stroke    Controlled type 2 diabetes mellitus with hyperglycemia, without long-term current use of insulin ([name])    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   Dyslipidemia, goal LDL below 70 03/12/2019   Dysphagia due to recent stroke 03/12/2019   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, d/t large vessel dz 03/10/2019   Occlusion of right posterior communicating artery 03/10/2019   Gait disturbance, post-stroke    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    REFERRING DIAG: G81.94 (ICD-10-CM) - Left hemiparesis ([place])  THERAPY DIAG:  Other abnormalities of gait and mobility  Muscle weakness (generalized)  Unsteadiness on feet  Other lack of coordination  Rationale for Evaluation and Treatment Rehabilitation  PERTINENT HISTORY: CVA in 2020  PRECAUTIONS: fall, L hemi, thickened liquid recommendation  VITALS: BP before session 117/64, hr 67   SUBJECTIVE: Pt shakes head no in response to pain and fall questions.  PAIN:  Are you having pain? No   TODAY'S TREATMENT PT ambulates with pt from lobby ~125' to mat table at [facility]; cued pt for appropriate approximation to RW as pt steps into RW to meet front bar with hip causing excessive forward lean when trying to advance RW.  PT has minimal if any response to multimodal cues for safety.  Requires minA to manage RW to turn left to approach mat table.  Extended time required to complete task.  Updated wife on scheduling ST evaluation, pt indicates he wishes to wait until the end of session to complete this task with  wife.    Pt ambulates ~78' w/ SBA-CGA as pt fatigues with distance searching for left oriented red cones, max multimodal cuing provided without physical direction to cones w/ pt only successfully navigating to 2 of 5, he visually locates and acknowledges third cone without navigation to left to grab cone.  Able to reach outside RW BOS to retreive 2 found cones.  Seated pt performs trunk rotations w/ 1.1lb ball w/ cues for visual tracking to promote left attention.  Needs frequent cuing.  PT ambulates with  patient from OT tables to front lobby around far right hallway ~150' w/ SBA-MinA for RW management and approximation as pt has x2 instances of pushing RW excessively far in front of himself causing him to fall forward into step pattern.  Pt maintains close approximation to right wall without scuffing RUE, but without ability to correct to left when cued.  PATIENT EDUCATION: Education details: Continue HEP, moving at home.  Patient and Spouse Education method: Explanation Education comprehension: verbalized understanding and needs further education   HOME EXERCISE PROGRAM: Access Code: [email or web address] URL: [email or web address] Date: 04/10/2022 Prepared by: Estevan Ryder  Exercises - Sit to Stand with Counter Support  - 1 x daily - 7 x weekly - 3 sets - 10 reps - Seated Long Arc Quad  - 1 x daily - 7 x weekly - 3 sets - 10 reps - Seated Heel Toe Raises  - 1 x daily - 7 x weekly - 3 sets - 10 reps  You Can Walk For A Certain Length Of Time Each Day                          Walk 1 minute 2 times per day.             Increase 1  minute every 4 days              Work up to 5 minutes (1-2 times per day).           GOALS: Goals reviewed with patient? Yes   SHORT TERM GOALS= LTG based on initial POC   LONG TERM GOALS: Target date: 05/05/2022   Pt will complete final HEP with assist from wife as needed for improved functional strength  Baseline: to be provided Goal status: INITIAL   2.  Pt will improve 5x STS to </= 50 sec to demo improved functional LE strength and balance  Baseline: 66s with B UE + CGA and RW Goal status: INITIAL   3.  Pt will improve TUG to </= 100 secs to demonstrated reduced fall risk Baseline: 127s with RW + CGA Goal status: INITIAL  4. Pt will improve gait speed to >/= .66ms to demonstrate improved community ambulation  Baseline: .045m   Goal status: NEW       ASSESSMENT:   CLINICAL IMPRESSION: Focus of skilled session on continued  promotion of safety with ambulation using RW with patient continuing to be limited by inability to correct speed or mechanics of gait when cued.  Progress thus far is limited and pt is stable with PT planning to discharge in coming visits with realistic plan to maintain functionality at home.   OBJECTIVE IMPAIRMENTS Abnormal gait, cardiopulmonary status limiting activity, decreased activity tolerance, decreased balance, decreased cognition, decreased coordination, decreased endurance, decreased knowledge of condition, decreased knowledge of use of DME, difficulty walking, decreased safety awareness, and impaired perceived functional ability.    ACTIVITY LIMITATIONS carrying, lifting,  bending, standing, stairs, transfers, bed mobility, self feeding, and locomotion level   PARTICIPATION LIMITATIONS: cleaning, interpersonal relationship, driving, shopping, and community activity   PERSONAL FACTORS Age, Behavior pattern, Past/current experiences, Social background, Time since onset of injury/illness/exacerbation, and 1-2 comorbidities: previous CVA, pneumonia   are also affecting patient's functional outcome.    REHAB POTENTIAL: Fair time since onset, poor motivation reported   CLINICAL DECISION MAKING: Evolving/moderate complexity   EVALUATION COMPLEXITY: Moderate   PLAN: PT FREQUENCY: 2x/week   PT DURATION: 4 weeks   PLANNED INTERVENTIONS: Therapeutic exercises, Therapeutic activity, Neuromuscular re-education, Balance training, Gait training, Patient/Family education, Self Care, Joint mobilization, Stair training, Vestibular training, Visual/preceptual remediation/compensation, Orthotic/Fit training, DME instructions, Aquatic Therapy, Cognitive remediation, Electrical stimulation, Wheelchair mobility training, Manual therapy, and Re-evaluation   PLAN FOR NEXT SESSION:  re-assess inattention/vision?, continue to trial rollator with gait working on more attention to left side as pt tends to  hit/run into things, LE strengthening, activity tolerance      Elease Etienne, PT, DPT  04/27/22, 4:34 PM

## 2022-05-01 ENCOUNTER — Ambulatory Visit: Payer: Medicare Other

## 2022-05-01 DIAGNOSIS — M6281 Muscle weakness (generalized): Secondary | ICD-10-CM

## 2022-05-01 DIAGNOSIS — R2689 Other abnormalities of gait and mobility: Secondary | ICD-10-CM

## 2022-05-01 DIAGNOSIS — R278 Other lack of coordination: Secondary | ICD-10-CM

## 2022-05-01 DIAGNOSIS — R2681 Unsteadiness on feet: Secondary | ICD-10-CM

## 2022-05-01 NOTE — Therapy (Signed)
OUTPATIENT PHYSICAL THERAPY TREATMENT NOTE   Patient Name: Joe Perry. MRN: [id number] DOB:[birth year], 71 y.o., male Today's Date: 05/01/2022  PCP: Elsie Stain, MD REFERRING PROVIDER: Elsie Stain, MD  END OF SESSION:   PT End of Session - 05/01/22 1341     Visit Number 8    Number of Visits 9    Date for PT Re-Evaluation 05/05/22    Authorization Type UHC Medicare    PT Start Time 1400    PT Stop Time 6979    PT Time Calculation (min) 44 min    Equipment Utilized During Treatment Gait belt    Activity Tolerance Patient limited by fatigue;Patient tolerated treatment well    Behavior During Therapy [facility] for tasks assessed/performed;Flat affect             Past Medical History:  Diagnosis Date   Acute metabolic encephalopathy 4/80/1655   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 ([place]) 06/03/2019   Cerebrovascular accident (CVA) due to occlusion of vertebral artery ([name])    Diabetes mellitus without complication ([place])    Dysphagia    post stroke   Hypertension    Left upper lobe pneumonia 02/15/2021   Stroke [facility])    Past Surgical History:  Procedure Laterality Date   IR ANGIO INTRA EXTRACRAN SEL COM CAROTID INNOMINATE UNI L MOD SED  03/10/2019   IR ANGIO VERTEBRAL SEL SUBCLAVIAN INNOMINATE BILAT MOD SED  03/10/2019   IR CT HEAD LTD  03/10/2019   IR PERCUTANEOUS ART THROMBECTOMY/INFUSION INTRACRANIAL INC DIAG ANGIO  03/10/2019   RADIOLOGY WITH ANESTHESIA N/A 03/10/2019   Procedure: IR WITH ANESTHESIA/CODE STROKE;  Surgeon: Radiologist, Medication, MD;  Location: [place];  Service: Radiology;  Laterality: N/A;   Patient Active Problem List   Diagnosis Date Noted   Obesity (BMI 30.0-34.9) 03/27/2022   Left hemiparesis ([place]) 12/06/2020   Benign essential HTN    Cognitive deficit, post-stroke    Controlled type 2 diabetes mellitus with hyperglycemia, without long-term current use of insulin ([name])    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   Dyslipidemia, goal LDL below 70 03/12/2019   Dysphagia due to recent stroke 03/12/2019   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, d/t large vessel dz 03/10/2019   Occlusion of right posterior communicating artery 03/10/2019   Gait disturbance, post-stroke    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    REFERRING DIAG: G81.94 (ICD-10-CM) - Left hemiparesis ([place])  THERAPY DIAG:  Other abnormalities of gait and mobility  Muscle weakness (generalized)  Unsteadiness on feet  Other lack of coordination  Rationale for Evaluation and Treatment Rehabilitation  PERTINENT HISTORY: CVA in 2020  PRECAUTIONS: fall, L hemi, thickened liquid recommendation  SUBJECTIVE: Patient reports doing well. No falls/near falls. Per wife, patient walks faster at home than he does in the clinic and is able to negotiate 15 steps 2x a day at home for exercise.   PAIN:  Are you having pain? No   TODAY'S TREATMENT Gait:  -from lobby to mat (~50 ft): supervision with RW (took 5'47")   Theract:  -5xSTS: 17' with B UE + intermittent CGA  -TUG: 2'29" with RW + CGA; 2'36" with RW and CGA on 2nd trial   Gait:  - 115' scanning for 10 cones with CGA + TotalA cuing to fully scan for cones   -took patient ~15' to complete this task  - PT assisting with managing walker to encourage faster  gait speed- no noted carryover when PT removed support    PATIENT EDUCATION: Education details: Continue HEP, minimizing reaching outside BOS  Patient and Spouse Education method: Explanation Education comprehension: verbalized understanding and needs further education   HOME EXERCISE PROGRAM: Access Code: [email or web address] URL: [email or web address] Date: 04/10/2022 Prepared by: Estevan Ryder  Exercises - Sit to Stand with Counter Support  - 1 x daily - 7 x weekly - 3 sets - 10 reps - Seated Long Arc Quad  - 1 x daily - 7 x weekly - 3 sets -  10 reps - Seated Heel Toe Raises  - 1 x daily - 7 x weekly - 3 sets - 10 reps  You Can Walk For A Certain Length Of Time Each Day                          Walk 1 minute 2 times per day.             Increase 1  minute every 4 days              Work up to 5 minutes (1-2 times per day).           GOALS: Goals reviewed with patient? Yes   SHORT TERM GOALS= LTG based on initial POC   LONG TERM GOALS: Target date: 05/05/2022   Pt will complete final HEP with assist from wife as needed for improved functional strength  Baseline: provided Goal status: MET    2.  Pt will improve 5x STS to </= 50 sec to demo improved functional LE strength and balance  Baseline: 66s with B UE + CGA and RW Goal status: INITIAL   3.  Pt will improve TUG to </= 100 secs to demonstrated reduced fall risk Baseline: 127s with RW + CGA Goal status: INITIAL  4. Pt will improve gait speed to >/= .15ms to demonstrate improved community ambulation  Baseline: .068m   Goal status: NEW       ASSESSMENT:   CLINICAL IMPRESSION: Patient seen for skilled PT session with emphasis on beginning goal assessment and visual scanning while ambulating. Patient remains with very slow  and non-functional gait speed. He also requires up to [place] fully scan both L and R to find objects. Even with object at midline, patient appearing to have difficulty locating it at times. Due to cognitive deficits, PT unable to discern patients specific visual impairments. Patient completed the Timed Up and Go test (TUG) in 129 seconds.  Geriatrics: need for further assessment of fall risk: ? 12 sec; Recurrent falls: > 15 sec; Vestibular Disorders fall risk: > 15 sec; Parkinson's Disease fall risk: > 16 sec ([email or web address]). Five times Sit to Stand Test (FTSS) Method: Use a straight back chair with a solid seat that is 17-18" high. Ask participant to sit on the chair with arms folded across their chest.   Instructions: "Stand up and sit  down as quickly as possible 5 times, keeping your arms folded across your chest."   Measurement: Stop timing when the participant touches the chair in sitting the 5th time.  TIME: 63 sec  Cut off scores indicative of increased fall risk: >12 sec CVA, >16 sec PD, >13 sec vestibular (ANPTA Core Set of Outcome Measures for Adults with Neurologic Conditions, 2018). Continue POC.      OBJECTIVE IMPAIRMENTS Abnormal gait, cardiopulmonary status limiting activity, decreased activity tolerance, decreased balance, decreased cognition, decreased coordination, decreased  endurance, decreased knowledge of condition, decreased knowledge of use of DME, difficulty walking, decreased safety awareness, and impaired perceived functional ability.    ACTIVITY LIMITATIONS carrying, lifting, bending, standing, stairs, transfers, bed mobility, self feeding, and locomotion level   PARTICIPATION LIMITATIONS: cleaning, interpersonal relationship, driving, shopping, and community activity   PERSONAL FACTORS Age, Behavior pattern, Past/current experiences, Social background, Time since onset of injury/illness/exacerbation, and 1-2 comorbidities: previous CVA, pneumonia   are also affecting patient's functional outcome.    REHAB POTENTIAL: Fair time since onset, poor motivation reported   CLINICAL DECISION MAKING: Evolving/moderate complexity   EVALUATION COMPLEXITY: Moderate   PLAN: PT FREQUENCY: 2x/week   PT DURATION: 4 weeks   PLANNED INTERVENTIONS: Therapeutic exercises, Therapeutic activity, Neuromuscular re-education, Balance training, Gait training, Patient/Family education, Self Care, Joint mobilization, Stair training, Vestibular training, Visual/preceptual remediation/compensation, Orthotic/Fit training, DME instructions, Aquatic Therapy, Cognitive remediation, Electrical stimulation, Wheelchair mobility training, Manual therapy, and Re-evaluation   PLAN FOR NEXT SESSION:  Finish goal assess and dc       Debbora Dus, PT, DPT, [name]   05/01/22, 2:47 PM

## 2022-05-03 ENCOUNTER — Ambulatory Visit: Payer: Medicare Other

## 2022-05-03 DIAGNOSIS — R2689 Other abnormalities of gait and mobility: Secondary | ICD-10-CM

## 2022-05-03 DIAGNOSIS — M6281 Muscle weakness (generalized): Secondary | ICD-10-CM

## 2022-05-03 DIAGNOSIS — R2681 Unsteadiness on feet: Secondary | ICD-10-CM

## 2022-05-03 DIAGNOSIS — R278 Other lack of coordination: Secondary | ICD-10-CM | POA: Diagnosis not present

## 2022-05-03 NOTE — Therapy (Signed)
OUTPATIENT PHYSICAL THERAPY TREATMENT NOTE   Patient Name: Joe LEVESQUE Sr. MRN: [id number] DOB:1950/09/11, 71 y.o., male Today's Date: 05/03/2022  PCP: Elsie Stain, MD REFERRING PROVIDER: Elsie Stain, MD  PHYSICAL THERAPY DISCHARGE SUMMARY  Visits from Start of Care: 9  Current functional level related to goals / functional outcomes: Patient remains significantly impaired in his over functional gait speed and balance.    Remaining deficits: Impaired gait speed, impaired cognition resulting in poor carryover of learned compensations, impaired balance, decreased functional strength   Education / Equipment: PT POC, HEP, walking program    Patient agrees to discharge. Patient goals were partially met. Patient is being discharged due to lack of progress.  END OF SESSION:   PT End of Session - 05/03/22 1400     Visit Number 9    Number of Visits 9    Date for PT Re-Evaluation 05/05/22    Authorization Type [employer] Medicare    PT Start Time 1400    PT Stop Time 1435    PT Time Calculation (min) 35 min    Activity Tolerance Patient limited by fatigue    Behavior During Therapy Flat affect             Past Medical History:  Diagnosis Date   Acute metabolic encephalopathy 5/83/0940   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 ([place]) 06/03/2019   Cerebrovascular accident (CVA) due to occlusion of vertebral artery ([name])    Diabetes mellitus without complication ([place])    Dysphagia    post stroke   Hypertension    Left upper lobe pneumonia 02/15/2021   Stroke [facility])    Past Surgical History:  Procedure Laterality Date   IR ANGIO INTRA EXTRACRAN SEL COM CAROTID INNOMINATE UNI L MOD SED  03/10/2019   IR ANGIO VERTEBRAL SEL SUBCLAVIAN INNOMINATE BILAT MOD SED  03/10/2019   IR CT HEAD LTD  03/10/2019   IR PERCUTANEOUS ART THROMBECTOMY/INFUSION INTRACRANIAL INC DIAG ANGIO  03/10/2019   RADIOLOGY WITH ANESTHESIA N/A 03/10/2019    Procedure: IR WITH ANESTHESIA/CODE STROKE;  Surgeon: Radiologist, Medication, MD;  Location: [place];  Service: Radiology;  Laterality: N/A;   Patient Active Problem List   Diagnosis Date Noted   Obesity (BMI 30.0-34.9) 03/27/2022   Left hemiparesis ([place]) 12/06/2020   Benign essential HTN    Cognitive deficit, post-stroke    Controlled type 2 diabetes mellitus with hyperglycemia, without long-term current use of insulin ([name])    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   Dyslipidemia, goal LDL below 70 03/12/2019   Dysphagia due to recent stroke 03/12/2019   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, d/t large vessel dz 03/10/2019   Occlusion of right posterior communicating artery 03/10/2019   Gait disturbance, post-stroke    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    REFERRING DIAG: G81.94 (ICD-10-CM) - Left hemiparesis ([place])  THERAPY DIAG:  Other abnormalities of gait and mobility  Muscle weakness (generalized)  Unsteadiness on feet  Other lack of coordination  Rationale for Evaluation and Treatment Rehabilitation  PERTINENT HISTORY: CVA in 2020  PRECAUTIONS: fall, L hemi, thickened liquid recommendation  SUBJECTIVE: Patient reports doing well. No falls/near falls. Patient and wife reporting that exercises are going well. States he ambulates 32f at a time- wife states she doesn't allow patient to use RW in the house. Also states that he negotiates 16 steps 2x per day with her remaining at the bottom of the steps.  PAIN:  Are you having pain? No   TODAY'S TREATMENT Theract:  -goal assessment:   [facility] PT Assessment - 05/03/22 0001       Standardized Balance Assessment   Standardized Balance Assessment 10 meter walk test    Five times sit to stand comments  1'12" B UE + uncontrolled decent into chair    10 Meter Walk .79ms      Timed Up and Go Test   Normal TUG (seconds) 231               PATIENT EDUCATION: Education  details: PT POC, upcoming ST eval, OM results  Patient and Spouse Education method: Explanation Education comprehension: verbalized understanding and needs further education   HOME EXERCISE PROGRAM: Access Code: [email or web address]: [email or web address] Date: 04/10/2022 Prepared by: JEstevan Ryder Exercises - Sit to Stand with Counter Support  - 1 x daily - 7 x weekly - 3 sets - 10 reps - Seated Long Arc Quad  - 1 x daily - 7 x weekly - 3 sets - 10 reps - Seated Heel Toe Raises  - 1 x daily - 7 x weekly - 3 sets - 10 reps  You Can Walk For A Certain Length Of Time Each Day                          Walk 1 minute 2 times per day.             Increase 1  minute every 4 days              Work up to 5 minutes (1-2 times per day).           GOALS: Goals reviewed with patient? Yes   SHORT TERM GOALS= LTG based on initial POC   LONG TERM GOALS: Target date: 05/05/2022   Pt will complete final HEP with assist from wife as needed for improved functional strength  Baseline: provided Goal status: MET    2.  Pt will improve 5x STS to </= 50 sec to demo improved functional LE strength and balance  Baseline: 66s with B UE + CGA and RW; 1'12" B UE + consistent cuing and uncontrolled decent into chair Goal status: NOT MET   3.  Pt will improve TUG to </= 100 secs to demonstrated reduced fall risk Baseline: 127s with RW + CGA; 231s with RW Goal status: NOT MET  4. Pt will improve gait speed to >/= .172m to demonstrate improved community ambulation  Baseline: .0710m .6m22mGoal status: NOT MET       ASSESSMENT:   CLINICAL IMPRESSION: Patient seen for skilled PT session with emphasis on goal assessment for discharge. Patient met 1/4 LTG. He made little to no progress toward his remaining LTG and demonstrates little to no buy in to therapy in order to progress toward those LTG. Patient completed the Timed Up and Go test (TUG) in 231 seconds.  Geriatrics: need for further  assessment of fall risk: ? 12 sec; Recurrent falls: > 15 sec; Vestibular Disorders fall risk: > 15 sec; Parkinson's Disease fall risk: > 16 sec ([email or web address]). Five times Sit to Stand Test (FTSS) Method: Use a straight back chair with a solid seat that is 17-18" high. Ask participant to sit on the chair with arms folded across their chest.   Instructions: "Stand up and sit down as quickly as possible 5 times, keeping your arms folded across your  chest."   Measurement: Stop timing when the participant touches the chair in sitting the 5th time.  TIME: 1'12"  Cut off scores indicative of increased fall risk: >12 sec CVA, >16 sec PD, >13 sec vestibular (ANPTA Core Set of Outcome Measures for Adults with Neurologic Conditions, 2018). 10 Meter Walk Test: Patient instructed to walk 10 meters (32.8 ft) as quickly and as safely as possible at their normal speed x2 and at a fast speed x2. Time measured from 2 meter mark to 8 meter mark to accommodate ramp-up and ramp-down.  Normal speed: 0.22ms Cut off scores: <0.4 m/s = household Ambulator, 0.4-0.8 m/s = limited community Ambulator, >0.8 m/s = community Ambulator, >1.2 m/s = crossing a street, <1.0 = increased fall risk MCID 0.05 m/s (small), 0.13 m/s (moderate), 0.06 m/s (significant)  (ANPTA Core Set of Outcome Measures for Adults with Neurologic Conditions, 2018). Patient to dc from PT at this time due to lack of progress.   OBJECTIVE IMPAIRMENTS Abnormal gait, cardiopulmonary status limiting activity, decreased activity tolerance, decreased balance, decreased cognition, decreased coordination, decreased endurance, decreased knowledge of condition, decreased knowledge of use of DME, difficulty walking, decreased safety awareness, and impaired perceived functional ability.    ACTIVITY LIMITATIONS carrying, lifting, bending, standing, stairs, transfers, bed mobility, self feeding, and locomotion level   PARTICIPATION LIMITATIONS: cleaning,  interpersonal relationship, driving, shopping, and community activity   PERSONAL FACTORS Age, Behavior pattern, Past/current experiences, Social background, Time since onset of injury/illness/exacerbation, and 1-2 comorbidities: previous CVA, pneumonia   are also affecting patient's functional outcome.    REHAB POTENTIAL: Fair time since onset, poor motivation reported   CLINICAL DECISION MAKING: Evolving/moderate complexity   EVALUATION COMPLEXITY: Moderate   PLAN: PT FREQUENCY: 2x/week   PT DURATION: 4 weeks   PLANNED INTERVENTIONS: Therapeutic exercises, Therapeutic activity, Neuromuscular re-education, Balance training, Gait training, Patient/Family education, Self Care, Joint mobilization, Stair training, Vestibular training, Visual/preceptual remediation/compensation, Orthotic/Fit training, DME instructions, Aquatic Therapy, Cognitive remediation, Electrical stimulation, Wheelchair mobility training, Manual therapy, and Re-evaluation   PLAN FOR NEXT SESSION:  Dc from [facility], DPT, [name]   05/03/22, 2:37 PM

## 2022-05-09 ENCOUNTER — Encounter: Payer: Self-pay | Admitting: Speech Pathology

## 2022-05-09 ENCOUNTER — Ambulatory Visit: Payer: Medicare Other | Attending: Critical Care Medicine | Admitting: Speech Pathology

## 2022-05-09 DIAGNOSIS — R1310 Dysphagia, unspecified: Secondary | ICD-10-CM | POA: Diagnosis present

## 2022-05-09 NOTE — Therapy (Signed)
OUTPATIENT SPEECH LANGUAGE PATHOLOGY EVALUATION   Patient Name: Joe SMITHERMAN Sr. MRN: [id number] DOB:14-Oct-1950, 71 y.o., male Today's Date: 05/09/2022  PCP: Elsie Stain, MD REFERRING PROVIDER: Elsie Stain, MD   End of Session - 05/09/22 1545     Visit Number 1    Number of Visits 1    SLP Start Time 2202    SLP Stop Time  5427    SLP Time Calculation (min) 40 min    Activity Tolerance Patient limited by fatigue             Past Medical History:  Diagnosis Date   Acute metabolic encephalopathy 0/62/3762   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 ([place]) 06/03/2019   Cerebrovascular accident (CVA) due to occlusion of vertebral artery ([name])    Diabetes mellitus without complication ([place])    Dysphagia    post stroke   Hypertension    Left upper lobe pneumonia 02/15/2021   Stroke [facility])    Past Surgical History:  Procedure Laterality Date   IR ANGIO INTRA EXTRACRAN SEL COM CAROTID INNOMINATE UNI L MOD SED  03/10/2019   IR ANGIO VERTEBRAL SEL SUBCLAVIAN INNOMINATE BILAT MOD SED  03/10/2019   IR CT HEAD LTD  03/10/2019   IR PERCUTANEOUS ART THROMBECTOMY/INFUSION INTRACRANIAL INC DIAG ANGIO  03/10/2019   RADIOLOGY WITH ANESTHESIA N/A 03/10/2019   Procedure: IR WITH ANESTHESIA/CODE STROKE;  Surgeon: Radiologist, Medication, MD;  Location: [place];  Service: Radiology;  Laterality: N/A;   Patient Active Problem List   Diagnosis Date Noted   Obesity (BMI 30.0-34.9) 03/27/2022   Left hemiparesis ([place]) 12/06/2020   Benign essential HTN    Cognitive deficit, post-stroke    Controlled type 2 diabetes mellitus with hyperglycemia, without long-term current use of insulin ([name])    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   Dyslipidemia, goal LDL below 70 03/12/2019   Dysphagia due to recent stroke 03/12/2019   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, d/t large vessel dz  03/10/2019   Occlusion of right posterior communicating artery 03/10/2019   Gait disturbance, post-stroke    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    ONSET DATE: referral 04-09-2022   REFERRING DIAG:  I63.00 (ICD-10-CM) - Cerebrovascular accident (CVA) due to thrombosis of precerebral artery ([place])  I69.319 (ICD-10-CM) - Cognitive deficit, post-stroke  I69.391 (ICD-10-CM) - Dysphagia due to recent stroke    THERAPY DIAG:  Dysphagia, unspecified type  Rationale for Evaluation and Treatment Rehabilitation  SUBJECTIVE:   SUBJECTIVE STATEMENT: "He doesn't have trouble eating" wife re: pt's swallow Pt accompanied by: significant other  PERTINENT HISTORY: CVA 2020, prior OP ST 2021/2022. Presented to ED February 2/2 cough, chest x-ray unremarkable.   PAIN:  Are you having pain? No   FALLS: Has patient fallen in last 6 months?  No  LIVING ENVIRONMENT: Lives with: lives with their spouse Lives in: House/apartment  PLOF:  Level of assistance: Needed assistance with ADLs, Needed assistance with IADLS Employment: On disability   PATIENT GOALS "see if swallow ok"  OBJECTIVE:   DIAGNOSTIC FINDINGS: 10/20/2021 DG CHEST IMPRESSION: No active cardiopulmonary disease.  COGNITION: Overall cognitive status: Impaired and Difficulty to assess due to: Communication impairment   ORAL MOTOR EXAMINATION Overall status: Impaired:   Labial: Bilateral (ROM and Strength) Lingual: Bilateral (ROM and Strength) Mandible: ROM  Comments: limited d/t cognitive impairment. Voice noted to be with significantly decreased intensity, pt spouse reports this is ongoing from 2020.  CLINICAL SWALLOW ASSESSMENT:   Current diet: regular and thin liquids Dentition: adequate natural dentition Patient directly observed with POs: Yes: regular, dysphagia 3 (soft), and thin liquids  Feeding: able to feed self Liquids provided by: cup Oral phase signs and symptoms: prolonged mastication and oral holding Pharyngeal phase  signs and symptoms: immediate cough with soft solid.  Comments: Pt with cough at baseline, prior to PO administration. Pt observed to take single sips of water from cup, w/o cueing. Spouse reports pt "takes it slow" and denies any dysphagia, odynophagia, or coughing and choking limited to PO intake. Reports to pt eating regular diet at home with increased soft foods for pt preference. Mildly prolonged mastication, but it was functional.   TODAY'S TREATMENT:  SLP provided education on observations from today's evaluation and purpose of MBSS. Pt's spouse reports pt would no change liquid type and continue to implement strategies and compensations from prior ST course addressing dysphagia. As such, MBSS is not recommended at this time. Provided re-education on swallow precautions and recommendations to decrease likelihood of aspiration and increase swallow safety. Pt spouse verbalizes understanding. Pt required to be awoken several times during evaluation, SLP provided education on ensuring pt is alert and upright with all PO, spouse reports she does this at home.   PATIENT EDUCATION: Education details: see above Person educated: Patient and Spouse Education method: Customer service manager Education comprehension: verbalized understanding     ASSESSMENT:  CLINICAL IMPRESSION: Patient is a 71 y.o. M who was seen today for dysphagia. Pt with ongoing cough, spouse reporting cough is independent of PO, though does sometimes co-occur. Have not noticed any overt changes in swallow function or increased coughing with eating since last ST intervention course. Cough present upon presentation to [place] room, prior to PO. Limited oral mechanism exam completed, 2/2 cognitive deficits, though no overt abnormalities observed. Pt observed with soft solids, hard solids, and thin liquid via cup. X1 instance of cough with soft solid, no overt s/sx aspiration with thin liquids. Pt observed to take single sips, which  wife reports to being pt's baseline. Small bites of hard swallows. Completely clears oral cavity following mildly prolonged mastication. Objective swallow study is not recommended at this time. Education provided on aspiration precautions and strategies wife can implement to maximize swallow safety during mealtimes at home. Advised spouse to seek new referral should pt's swallow function change.   PLAN: SLP FREQUENCY: one time visit   Su Monks, [place] 05/09/2022, 3:46 PM

## 2022-05-30 ENCOUNTER — Telehealth: Payer: Self-pay

## 2022-05-30 NOTE — Patient Outreach (Signed)
  Care Coordination   Initial Visit Note   05/30/2022 Name: Joe LADUCA Sr. MRN: [id number] DOB: February 26, 1951  Joe Eddy Sr. is a 71 y.o. year old male who sees Joe Stain, MD for primary care. I spoke with  wife by phone today.  What matters to the patients health and wellness today?  Spouse reports patient doing good- currently eating breakfast. States he is out of one of his meds. Spouse has not contacted pharmacy to get refill but will do so today. Denies any RN CM needs or concerns at this time.     Goals Addressed             This Visit's Progress    COMPLETED: Care Coordination-no follow up required       Care Coordination Interventions: Advised patient to schedule annual wellness visit and influenza vaccine Provided education to patient re: [facility] services Assessed social determinant of health barriers         SDOH assessments and interventions completed:  Yes  SDOH Interventions Today    Flowsheet Row Most Recent Value  SDOH Interventions   Food Insecurity Interventions Intervention Not Indicated  Transportation Interventions Intervention Not Indicated        Care Coordination Interventions Activated:  Yes  Care Coordination Interventions:  Yes, provided   Follow up plan: No further intervention required.   Encounter Outcome:  Pt. Visit Completed   Enzo Montgomery, RN,BSN,CCM [place] [facility] Telephonic Care Management Coordinator Direct Phone: [phone] Toll Free: [phone] Fax: [phone]

## 2022-07-03 ENCOUNTER — Other Ambulatory Visit: Payer: Self-pay | Admitting: Critical Care Medicine

## 2022-07-04 ENCOUNTER — Ambulatory Visit: Payer: Medicare Other | Attending: Critical Care Medicine | Admitting: Internal Medicine

## 2022-07-04 ENCOUNTER — Encounter: Payer: Self-pay | Admitting: Internal Medicine

## 2022-07-04 ENCOUNTER — Ambulatory Visit: Payer: Medicare Other | Admitting: Critical Care Medicine

## 2022-07-04 VITALS — BP 124/70 | HR 80 | Wt 192.2 lb

## 2022-07-04 DIAGNOSIS — E1159 Type 2 diabetes mellitus with other circulatory complications: Secondary | ICD-10-CM

## 2022-07-04 DIAGNOSIS — Z2911 Encounter for prophylactic immunotherapy for respiratory syncytial virus (RSV): Secondary | ICD-10-CM | POA: Diagnosis not present

## 2022-07-04 DIAGNOSIS — I152 Hypertension secondary to endocrine disorders: Secondary | ICD-10-CM

## 2022-07-04 DIAGNOSIS — Z23 Encounter for immunization: Secondary | ICD-10-CM

## 2022-07-04 DIAGNOSIS — E1169 Type 2 diabetes mellitus with other specified complication: Secondary | ICD-10-CM

## 2022-07-04 DIAGNOSIS — E785 Hyperlipidemia, unspecified: Secondary | ICD-10-CM

## 2022-07-04 LAB — POCT GLYCOSYLATED HEMOGLOBIN (HGB A1C): HbA1c, POC (controlled diabetic range): 8.1 % — AB (ref 0.0–7.0)

## 2022-07-04 LAB — GLUCOSE, POCT (MANUAL RESULT ENTRY): POC Glucose: 361 mg/dl — AB (ref 70–99)

## 2022-07-04 MED ORDER — CHLORTHALIDONE 25 MG PO TABS: 25.0000 mg | ORAL_TABLET | Freq: Every day | ORAL | 1 refills | Status: DC

## 2022-07-04 MED ORDER — METFORMIN HCL 1000 MG PO TABS: 1000.0000 mg | ORAL_TABLET | Freq: Two times a day (BID) | ORAL | 1 refills | Status: DC

## 2022-07-04 MED ORDER — RSVPREF3 VAC RECOMB ADJUVANTED 120 MCG/0.5ML IM SUSR: 0.5000 mL | Freq: Once | INTRAMUSCULAR | 0 refills | Status: AC

## 2022-07-04 NOTE — Progress Notes (Signed)
Patient ID: Joe Perry., male    DOB: 06-25-1951  MRN: [id number]  CC: Chronic disease follow-up. Testing 1 Subjective: Joe Perry is a 71 y.o. male who presents for chronic disease follow-up.  Wife is with him.  PCP is Dr. Joya Gaskins who is currently on leave. 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 Wife checks BP every morning.  Range 130/75-80 Limits salt in the foods. No chest pains or shortness of breath, LE edema..  DM:  Results for orders placed or performed in visit on 07/04/22  POCT glucose (manual entry)  Result Value Ref Range   POC Glucose 361 (A) 70 - 99 mg/dl  POCT glycosylated hemoglobin (Hb A1C)  Result Value Ref Range   Hemoglobin A1C     HbA1c POC (<> result, manual entry)     HbA1c, POC (prediabetic range)     HbA1c, POC (controlled diabetic range) 8.1 (A) 0.0 - 7.0 %    A1C 8.1/BS 361 Checks BS once a day before BF.  Range 115-130. Wife does cooking.  Not eating out much.  Wife thinks blood sugar is elevated today because he ate some bread before he came. Reports compliance with taking metformin 500 mg twice a day  HL: Taking atorvastatin 40 mg daily as prescribed.  Last LDL was 39   HM: Due for flu shot and COVID booster.  Also due for Medicare wellness.  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     amLODipine (NORVASC) 10 MG tablet Take 1 tablet (10 mg total) by mouth daily. To lower blood pressure 90 tablet 2   aspirin EC 81 MG tablet Take 1 tablet (81 mg total) by mouth daily. 100 tablet 3   atorvastatin (LIPITOR) 40 MG tablet TAKE 1 TABLET BY MOUTH IN THE EVENING TO LOWER CHOLESTEROL. 90 tablet 2   azelastine (ASTELIN) 0.1 % nasal spray Place 2 sprays into both nostrils 2 (two) times daily. Use in each nostril as directed 30 mL 12   cetirizine (ZYRTEC) 10 MG tablet Take 1 tablet (10 mg total) by mouth daily. 30 tablet 11   donepezil (ARICEPT) 10 MG tablet Take 1 tablet (10 mg total) by mouth at bedtime. 90 tablet 1   losartan (COZAAR) 50 MG tablet 1 tab(s) orally once a day for 30 day(s) 90 tablet 2   QUEtiapine (SEROQUEL) 100 MG tablet Take 1 tablet (100 mg  total) by mouth at bedtime. 30 tablet 4   No current facility-administered medications on file prior to visit.    No Known Allergies  Social History   Socioeconomic History   Marital status: Married    Spouse name: [name]   Number of children: Not on file   Years of education: Not on file   Highest education level: Not on file  Occupational History   Not on file  Tobacco Use   Smoking status: Never   Smokeless tobacco: Never  Vaping Use   Vaping Use: Never used  Substance and Sexual Activity   Alcohol use: No   Drug use: No   Sexual activity: Not Currently  Other Topics Concern   Not on file  Social History Narrative   12/16/19 Lives with wife   Social Determinants of Health   Financial  Resource Strain: Not on file  Food Insecurity: No Food Insecurity (05/30/2022)   Hunger Vital Sign    Worried About Running Out of Food in the Last Year: Never true    Ran Out of Food in the Last Year: Never true  Transportation Needs: No Transportation Needs (05/30/2022)   PRAPARE - Hydrologist (Medical): No    Lack of Transportation (Non-Medical): No  Physical Activity: Not on file  Stress: Not on file  Social Connections: Not on file  Intimate Partner Violence: Not on file    Family History  Problem Relation Age of Onset   Diabetes Other    Hypertension Other    Healthy Mother    Healthy Father     Past Surgical History:  Procedure Laterality Date   IR ANGIO INTRA EXTRACRAN SEL COM CAROTID INNOMINATE UNI L MOD SED  03/10/2019   IR ANGIO VERTEBRAL SEL SUBCLAVIAN INNOMINATE BILAT MOD SED  03/10/2019   IR CT HEAD LTD  03/10/2019   IR PERCUTANEOUS ART THROMBECTOMY/INFUSION INTRACRANIAL INC DIAG ANGIO  03/10/2019   RADIOLOGY WITH ANESTHESIA N/A 03/10/2019   Procedure: IR WITH ANESTHESIA/CODE STROKE;  Surgeon: Radiologist, Medication, MD;  Location: [facility];  Service: Radiology;  Laterality: N/A;    ROS: Review of Systems Negative except as stated above  PHYSICAL EXAM: BP 124/70   Pulse 80   Wt 192 lb 3.2 oz (87.2 kg)   SpO2 97%   BMI 26.07 kg/m   Physical Exam  General appearance - alert, well appearing, older African male and in no distress.  Patient in chair Mental status -patient mumbles and answers yes and no to questioning Neck - supple, no significant adenopathy Chest - clear to auscultation, no wheezes, rales or rhonchi, symmetric air entry Heart - normal rate, regular rhythm, normal S1, S2, no murmurs, rubs, clicks or gallops Extremities -no lower extremity edema      Latest Ref Rng & Units 03/28/2022    4:28 PM 08/15/2021    4:05 PM 02/17/2021    2:56 AM  CMP  Glucose 70 - 99 mg/dL 111  141  130   BUN 8 - 27 mg/dL '14  17  9    '$ Creatinine 0.76 - 1.27 mg/dL 0.93  0.92  0.68   Sodium 134 - 144 mmol/L 142  142  140   Potassium 3.5 - 5.2 mmol/L 4.9  4.6  3.5   Chloride 96 - 106 mmol/L 105  101  103   CO2 20 - 29 mmol/L '24  24  29   '$ Calcium 8.6 - 10.2 mg/dL 9.5  10.1  9.2  Total Protein 6.0 - 8.5 g/dL 7.2     Total Bilirubin 0.0 - 1.2 mg/dL 0.5     Alkaline Phos 44 - 121 IU/L 63     AST 0 - 40 IU/L 15     ALT 0 - 44 IU/L 16      Lipid Panel     Component Value Date/Time   CHOL 109 03/28/2022 1628   TRIG 128 03/28/2022 1628   HDL 47 03/28/2022 1628   CHOLHDL 2.3 03/28/2022 1628   CHOLHDL 3.5 03/11/2019 0946   VLDL 13 03/11/2019 0946   LDLCALC 39 03/28/2022 1628    CBC    Component Value Date/Time   WBC 5.3 03/28/2022 1628   WBC 5.3 02/17/2021 0256   RBC 3.95 (L) 03/28/2022 1628   RBC 4.03 (L) 02/17/2021 0256   HGB 12.1 (L) 03/28/2022 1628   HCT 36.3 (L) 03/28/2022 1628   PLT 219 03/28/2022 1628   MCV 92 03/28/2022 1628   MCH 30.6 03/28/2022 1628   MCH 30.5 02/17/2021 0256   MCHC 33.3 03/28/2022 1628   MCHC 32.8 02/17/2021 0256   RDW 12.0 03/28/2022 1628   LYMPHSABS 2.6 03/28/2022 1628   MONOABS 0.5 02/15/2021 1442   EOSABS 0.2 03/28/2022 1628   BASOSABS 0.0 03/28/2022 1628    ASSESSMENT AND PLAN: 1. Type 2 diabetes mellitus with other circulatory complication, without long-term current use of insulin ([place]) Not at goal.  Dietary counseling given.  Increase metformin to 1 g twice a day advised to check blood sugars twice a day before meals.  Follow-up with clinical pharmacist in several weeks for recheck - POCT glucose (manual entry) - POCT glycosylated hemoglobin (Hb A1C) - metFORMIN (GLUCOPHAGE) 1000 MG tablet; Take 1 tablet (1,000 mg total) by mouth 2 (two) times daily with a meal.  Dispense: 180 tablet; Refill: 1  2. Hypertension associated with type 2 diabetes mellitus ([place]) At goal.  Continue current medications listed above - chlorthalidone (HYGROTON) 25 MG tablet; Take 1 tablet (25  mg total) by mouth daily.  Dispense: 90 tablet; Refill: 1  3. Hyperlipidemia associated with type 2 diabetes mellitus ([name]) Continue atorvastatin 40 mg daily.  4. Need for RSV immunization Discussed recommendations for RSV vaccine.  I have printed prescription and given it to his wife to take to our pharmacy downstairs or any outside pharmacy - RSV vaccine recomb adjuvanted (AREXVY) 120 MCG/0.5ML injection; Inject 0.5 mLs into the muscle once for 1 dose.  Dispense: 0.5 mL; Refill: 0  6. Need for immunization against influenza - Flu Vaccine QUAD 76moIM (Fluarix, Fluzone & Alfiuria Quad PF)    Patient was given the opportunity to ask questions.  Patient verbalized understanding of the plan and was able to repeat key elements of the plan.   This documentation was completed using DRadio producer  Any transcriptional errors are unintentional.  Orders Placed This Encounter  Procedures   Flu Vaccine QUAD 658moM (Fluarix, Fluzone & Alfiuria Quad PF)   POCT glucose (manual entry)   POCT glycosylated hemoglobin (Hb A1C)     Requested Prescriptions   Signed Prescriptions Disp Refills   chlorthalidone (HYGROTON) 25 MG tablet 90 tablet 1    Sig: Take 1 tablet (25 mg total) by mouth daily.   metFORMIN (GLUCOPHAGE) 1000 MG tablet 180 tablet 1    Sig: Take 1 tablet (1,000 mg total) by mouth 2 (two) times daily with a meal.   RSV vaccine recomb adjuvanted (AREXVY) 120 MCG/0.5ML injection 0.5 mL  0    Sig: Inject 0.5 mLs into the muscle once for 1 dose.    Return in about 3 months (around 10/04/2022) for Give f/u with PCP Dr. Joya Gaskins.  Give appt with Lurena Joiner in 1 mth for BS check.  Karle Plumber, MD, FACP

## 2022-07-04 NOTE — Telephone Encounter (Signed)
   Notes to clinic:  has appt today, please assess.       Requested Prescriptions  Pending Prescriptions Disp Refills   chlorthalidone (HYGROTON) 25 MG tablet [Pharmacy Med Name: Chlorthalidone 25 MG Oral Tablet] 30 tablet 0    Sig: Take 1 tablet by mouth once daily     Cardiovascular: Diuretics - Thiazide Passed - 07/03/2022 12:46 PM      Passed - Cr in normal range and within 180 days    Creat  Date Value Ref Range Status  09/08/2013 0.96 0.50 - 1.35 mg/dL Final   Creatinine, Ser  Date Value Ref Range Status  03/28/2022 0.93 0.76 - 1.27 mg/dL Final         Passed - K in normal range and within 180 days    Potassium  Date Value Ref Range Status  03/28/2022 4.9 3.5 - 5.2 mmol/L Final         Passed - Na in normal range and within 180 days    Sodium  Date Value Ref Range Status  03/28/2022 142 134 - 144 mmol/L Final         Passed - Last BP in normal range    BP Readings from Last 1 Encounters:  03/28/22 132/80         Passed - Valid encounter within last 6 months    Recent Outpatient Visits           3 months ago Controlled type 2 diabetes mellitus with hyperglycemia, without long-term current use of insulin ([place])   [place] Elsie Stain, MD   5 months ago Dysphagia due to recent stroke   [place], MD   7 months ago Controlled type 2 diabetes mellitus with hyperglycemia, without long-term current use of insulin [facility])   [place] Elsie Stain, MD   10 months ago Controlled type 2 diabetes mellitus with hyperglycemia, without long-term current use of insulin [facility])   [place], Patrick E, MD   1 year ago Essential hypertension   [place], Patrick E, MD       Future Appointments             Today Ladell Pier, MD [place]

## 2022-07-04 NOTE — Patient Instructions (Signed)
Your blood sugar is elevated.  We have increased the metformin to 1000 mg twice a day.  Please check blood sugars before breakfast and before dinner with goal being 90-130.  Please record your readings and bring them with you in 1 month to see the clinical pharmacist.  I have printed the prescription for you to have the RSV vaccine.  Please take the prescription to your pharmacy.

## 2022-08-10 ENCOUNTER — Encounter: Payer: Self-pay | Admitting: Pharmacist

## 2022-08-10 ENCOUNTER — Ambulatory Visit: Payer: Medicare Other | Attending: Critical Care Medicine | Admitting: Pharmacist

## 2022-08-10 DIAGNOSIS — E1159 Type 2 diabetes mellitus with other circulatory complications: Secondary | ICD-10-CM | POA: Diagnosis not present

## 2022-08-10 NOTE — Progress Notes (Signed)
    S:     No chief complaint on file.  71 y.o. male who presents for diabetes evaluation, education, and management.  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 Patient was referred and last seen by Dr. Wynetta Emery on 07/04/2022. At last visit, A1c was 8.1%. Gave home CBG range of 115-130. He reported compliance to his metformin. This dose was increased to 1000 mg BID.    Today, patient arrives in good spirits and presents with assistance from his wife. He is in a wheelchair. His wife provides his history.  Patient reports Diabetes is longstanding.   Family/Social History:  Fhx: DM, HTN Tobacco: never smoker  Alcohol: none reported   Current diabetes medications include: metformin 1000 mg BID Current hypertension medications include: amlodipine 10 mg daily, chlorthalidone 25 mg daily, losartan 50 mg daily  Current hyperlipidemia medications include: atorvastatin 40 mg daily   Patient reports adherence to taking all medications as prescribed.   Insurance coverage: [employer]  Patient reports hypoglycemic events.  Patient denies nocturia (nighttime urination).  Patient denies neuropathy (nerve pain). Patient denies visual changes. Patient reports self foot exams.   Patient reported dietary habits:  -Tries to limit carbs, starches. His wife prepares most of his food.   Patient-reported exercise habits: physical activity is limited with his stroke hx.  O:   ROS  Physical Exam  7 day average blood glucose: no meter with him but he brings his log in for review.  CBG range at home from 107 - 137. No levels >150 since his increase in metformin dose.   Lab Results  Component Value Date   HGBA1C 8.1 (A) 07/04/2022   There were no vitals filed for this visit.  Lipid Panel     Component Value Date/Time   CHOL 109 03/28/2022 1628   TRIG 128 03/28/2022 1628   HDL 47 03/28/2022 1628    CHOLHDL 2.3 03/28/2022 1628   CHOLHDL 3.5 03/11/2019 0946   VLDL 13 03/11/2019 0946   LDLCALC 39 03/28/2022 1628    Clinical Atherosclerotic Cardiovascular Disease (ASCVD): Yes  The ASCVD Risk score (Arnett DK, et al., 2019) failed to calculate for the following reasons:   The patient has a prior MI or stroke diagnosis   A/P: Diabetes longstanding currently above goal with last A1c. I think we can target a less stringent goal of 7.5% or even 8.0 given his functional status. His wife maintains that his sugar is lower but he is asymptomatic. No objective or subjective hypoglycemia reported. Patient's wife is able to verbalize appropriate hypoglycemia management plan. Medication adherence appears appropriate. -Continued metformin 1000 mg BID.  -Patient educated on purpose, proper use, and potential adverse effects of metformin.  -Extensively discussed pathophysiology of diabetes, recommended lifestyle interventions, dietary effects on blood sugar control.  -Counseled on s/sx of and management of hypoglycemia.  -Next A1c anticipated 09/2021.   Written patient instructions provided. Patient verbalized understanding of treatment plan.  Total time in face to face counseling 15 minutes.    Follow-up:  Pharmacist prn. PCP clinic visit in Jan.  Benard Halsted, PharmD, [place], [place] [phone]

## 2022-10-01 NOTE — Progress Notes (Unsigned)
Established Patient Office Visit  Subjective:  Patient ID: Joe Tomkins., male    DOB: 02-03-1951  Age: 72 y.o. MRN: [id number]  CC:  Eye issues, conjunctival bleeding  HPI 11/2021 Joe Eddy Sr. presents for hypertension and follow-up.  Patient is accompanied by his spouse.  On arrival blood pressure remains elevated 147/88.  On recheck it is in the same range.  He has history of dysphagia due to recent stroke.  He still coughs when he eats.  Patient maintains low-dose amlodipine.  Patient does main taint atorvastatin and aspirin.  He is now off the Plavix.  Patient also maintaining Aricept for chronic memory loss.  He is fully vaccinated.  On arrival A1c is 6.8.  He maintains the metformin.  There are no other complaints at this time.  5/17 Patient returns in follow-up continues with chronic cough off-and-on related to aspiration and dysphagia.  The patient had 1 coughing paroxysm and developed left conjunctival bleed.  Patient was seen in urgent care May 7 and was just given conservative observation.  Since that time the bleeding has resolved.  Patient has no other complaints at this time.  The wife is pondering may be taking him back to [place] to see his children as they are not able to travel to the [place] due to visa restrictions at this time.  Patient also is not sleeping well at night he is taking the 50 mg of Seroquel only at bedtime  Note at the last visit A1c was at goal and blood pressure today is good 133/85  7/25 Patient seen in return follow-up he is having a bit more coughing with eating but he tends to eat in larger amounts.  The wife would like for him to go back to physical therapy to get him stronger.  On arrival blood pressure is stable 135/81  Patient does have some edema in the lower extremities.  Patient is compliant with amlodipine 10 mg daily and takes aspirin 81 mg daily after stroke.  Patient does maintain the atorvastatin daily.  Metformin  is maintained twice daily.  Patient is sleeping better on Seroquel.  A1c at this visit at goal 7.1 Past Medical History:  Diagnosis Date   Acute metabolic encephalopathy 8/65/7846   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 ([place]) 06/03/2019   Cerebrovascular accident (CVA) due to occlusion of vertebral artery ([name])    Diabetes mellitus without complication ([place])    Dysphagia    post stroke   Hypertension    Left upper lobe pneumonia 02/15/2021   Stroke [facility])     Past Surgical History:  Procedure Laterality Date   IR ANGIO INTRA EXTRACRAN SEL COM CAROTID INNOMINATE UNI L MOD SED  03/10/2019   IR ANGIO VERTEBRAL SEL SUBCLAVIAN INNOMINATE BILAT MOD SED  03/10/2019   IR CT HEAD LTD  03/10/2019   IR PERCUTANEOUS ART THROMBECTOMY/INFUSION INTRACRANIAL INC DIAG ANGIO  03/10/2019   RADIOLOGY WITH ANESTHESIA N/A 03/10/2019   Procedure: IR WITH ANESTHESIA/CODE STROKE;  Surgeon: Radiologist, Medication, MD;  Location: [place];  Service: Radiology;  Laterality: N/A;    Family History  Problem Relation Age of Onset   Diabetes Other    Hypertension Other    Healthy Mother    Healthy Father     Social History   Socioeconomic History   Marital status: Married    Spouse name: [name]   Number of children: Not on file   Years of education:  Not on file   Highest education level: Not on file  Occupational History   Not on file  Tobacco Use   Smoking status: Never   Smokeless tobacco: Never  Vaping Use   Vaping Use: Never used  Substance and Sexual Activity   Alcohol use: No   Drug use: No   Sexual activity: Not Currently  Other Topics Concern   Not on file  Social History Narrative   12/16/19 Lives with wife   Social Determinants of Health   Financial Resource Strain: Not on file  Food Insecurity: No Food Insecurity (05/30/2022)   Hunger Vital Sign    Worried About Running Out of Food in the Last Year: Never true    Ran Out of Food in the Last  Year: Never true  Transportation Needs: No Transportation Needs (05/30/2022)   PRAPARE - Hydrologist (Medical): No    Lack of Transportation (Non-Medical): No  Physical Activity: Not on file  Stress: Not on file  Social Connections: Not on file  Intimate Partner Violence: Not on file    Outpatient Medications Prior to Visit  Medication Sig Dispense Refill   acetaminophen (TYLENOL) 325 MG tablet Take 2 tablets (650 mg total) by mouth every 4 (four) hours as needed for mild pain (or temp > 37.5 C (99.5 F)).     amLODipine (NORVASC) 10 MG tablet Take 1 tablet (10 mg total) by mouth daily. To lower blood pressure 90 tablet 2   aspirin EC 81 MG tablet Take 1 tablet (81 mg total) by mouth daily. 100 tablet 3   atorvastatin (LIPITOR) 40 MG tablet TAKE 1 TABLET BY MOUTH IN THE EVENING TO LOWER CHOLESTEROL. 90 tablet 2   azelastine (ASTELIN) 0.1 % nasal spray Place 2 sprays into both nostrils 2 (two) times daily. Use in each nostril as directed 30 mL 12   cetirizine (ZYRTEC) 10 MG tablet Take 1 tablet (10 mg total) by mouth daily. 30 tablet 11   chlorthalidone (HYGROTON) 25 MG tablet Take 1 tablet (25 mg total) by mouth daily. 90 tablet 1   donepezil (ARICEPT) 10 MG tablet Take 1 tablet (10 mg total) by mouth at bedtime. 90 tablet 1   losartan (COZAAR) 50 MG tablet 1 tab(s) orally once a day for 30 day(s) 90 tablet 2   metFORMIN (GLUCOPHAGE) 1000 MG tablet Take 1 tablet (1,000 mg total) by mouth 2 (two) times daily with a meal. 180 tablet 1   QUEtiapine (SEROQUEL) 100 MG tablet Take 1 tablet (100 mg total) by mouth at bedtime. 30 tablet 4   No facility-administered medications prior to visit.    No Known Allergies  ROS Review of Systems  Constitutional: Negative.   HENT:  Positive for trouble swallowing. Negative for ear pain, postnasal drip, rhinorrhea, sinus pressure, sore throat and voice change.   Eyes: Negative.   Respiratory:  Positive for cough. Negative  for apnea, choking, chest tightness, shortness of breath, wheezing and stridor.   Cardiovascular: Negative.  Negative for chest pain, palpitations and leg swelling.  Gastrointestinal: Negative.  Negative for abdominal distention, abdominal pain, nausea and vomiting.  Genitourinary: Negative.   Musculoskeletal: Negative.  Negative for arthralgias and myalgias.  Skin: Negative.  Negative for rash.  Allergic/Immunologic: Negative.  Negative for environmental allergies and food allergies.  Neurological: Negative.  Negative for dizziness, syncope, weakness and headaches.  Hematological: Negative.  Negative for adenopathy. Does not bruise/bleed easily.  Psychiatric/Behavioral:  Positive for sleep disturbance.  Negative for agitation. The patient is not nervous/anxious.       Objective:    Physical Exam Vitals reviewed.  Constitutional:      Appearance: Normal appearance. He is well-developed. He is obese. He is not diaphoretic.  HENT:     Head: Normocephalic and atraumatic.     Nose: No nasal deformity, septal deviation, mucosal edema or rhinorrhea.     Right Sinus: No maxillary sinus tenderness or frontal sinus tenderness.     Left Sinus: No maxillary sinus tenderness or frontal sinus tenderness.     Mouth/Throat:     Pharynx: No oropharyngeal exudate.  Eyes:     General: No scleral icterus.    Conjunctiva/sclera: Conjunctivae normal.     Pupils: Pupils are equal, round, and reactive to light.  Neck:     Thyroid: No thyromegaly.     Vascular: No carotid bruit or JVD.     Trachea: Trachea normal. No tracheal tenderness or tracheal deviation.  Cardiovascular:     Rate and Rhythm: Normal rate and regular rhythm.     Chest Wall: PMI is not displaced.     Pulses: Normal pulses. No decreased pulses.     Heart sounds: Normal heart sounds, S1 normal and S2 normal. Heart sounds not distant. No murmur heard.    No systolic murmur is present.     No diastolic murmur is present.     No  friction rub. No gallop. No S3 or S4 sounds.  Pulmonary:     Effort: Pulmonary effort is normal. No tachypnea, accessory muscle usage or respiratory distress.     Breath sounds: Normal breath sounds. No stridor. No decreased breath sounds, wheezing, rhonchi or rales.  Chest:     Chest wall: No tenderness.  Abdominal:     General: Bowel sounds are normal. There is no distension.     Palpations: Abdomen is soft. Abdomen is not rigid.     Tenderness: There is no abdominal tenderness. There is no guarding or rebound.  Musculoskeletal:        General: Normal range of motion.     Cervical back: Normal range of motion and neck supple. No edema, erythema or rigidity. No muscular tenderness. Normal range of motion.  Lymphadenopathy:     Head:     Right side of head: No submental or submandibular adenopathy.     Left side of head: No submental or submandibular adenopathy.     Cervical: No cervical adenopathy.  Skin:    General: Skin is warm and dry.     Coloration: Skin is not pale.     Findings: No rash.     Nails: There is no clubbing.  Neurological:     Mental Status: He is alert and oriented to person, place, and time. Mental status is at baseline.     Sensory: No sensory deficit.     Motor: Weakness present.     Coordination: Coordination abnormal.     Gait: Gait abnormal.  Psychiatric:        Speech: Speech normal.        Behavior: Behavior normal.     There were no vitals taken for this visit. Wt Readings from Last 3 Encounters:  07/04/22 192 lb 3.2 oz (87.2 kg)  03/28/22 194 lb (88 kg)  01/18/22 190 lb 12.8 oz (86.5 kg)     Health Maintenance Due  Topic Date Due   Medicare Annual Wellness (AWV)  Never done   COVID-19 Vaccine (  4 - 2023-24 season) 05/05/2022   Diabetic kidney evaluation - Urine ACR  08/15/2022   FOOT EXAM  08/15/2022    There are no preventive care reminders to display for this patient.  Lab Results  Component Value Date   TSH 0.178 (L) 02/15/2021    Lab Results  Component Value Date   WBC 5.3 03/28/2022   HGB 12.1 (L) 03/28/2022   HCT 36.3 (L) 03/28/2022   MCV 92 03/28/2022   PLT 219 03/28/2022   Lab Results  Component Value Date   NA 142 03/28/2022   K 4.9 03/28/2022   CO2 24 03/28/2022   GLUCOSE 111 (H) 03/28/2022   BUN 14 03/28/2022   CREATININE 0.93 03/28/2022   BILITOT 0.5 03/28/2022   ALKPHOS 63 03/28/2022   AST 15 03/28/2022   ALT 16 03/28/2022   PROT 7.2 03/28/2022   ALBUMIN 4.4 03/28/2022   CALCIUM 9.5 03/28/2022   ANIONGAP 8 02/17/2021   EGFR 89 03/28/2022   Lab Results  Component Value Date   CHOL 109 03/28/2022   Lab Results  Component Value Date   HDL 47 03/28/2022   Lab Results  Component Value Date   LDLCALC 39 03/28/2022   Lab Results  Component Value Date   TRIG 128 03/28/2022   Lab Results  Component Value Date   CHOLHDL 2.3 03/28/2022   Lab Results  Component Value Date   HGBA1C 8.1 (A) 07/04/2022      Assessment & Plan:   Problem List Items Addressed This Visit   None No orders of the defined types were placed in this encounter.   Follow-up: No follow-ups on file.    Asencion Noble, MD

## 2022-10-03 ENCOUNTER — Ambulatory Visit: Payer: Medicare Other | Attending: Critical Care Medicine | Admitting: Critical Care Medicine

## 2022-10-03 ENCOUNTER — Other Ambulatory Visit: Payer: Self-pay | Admitting: Critical Care Medicine

## 2022-10-03 ENCOUNTER — Encounter: Payer: Self-pay | Admitting: Critical Care Medicine

## 2022-10-03 VITALS — BP 122/79 | HR 75 | Temp 98.2°F | Wt 187.0 lb

## 2022-10-03 DIAGNOSIS — D333 Benign neoplasm of cranial nerves: Secondary | ICD-10-CM

## 2022-10-03 DIAGNOSIS — I6930 Unspecified sequelae of cerebral infarction: Secondary | ICD-10-CM

## 2022-10-03 DIAGNOSIS — R058 Other specified cough: Secondary | ICD-10-CM

## 2022-10-03 DIAGNOSIS — E785 Hyperlipidemia, unspecified: Secondary | ICD-10-CM | POA: Diagnosis not present

## 2022-10-03 DIAGNOSIS — I10 Essential (primary) hypertension: Secondary | ICD-10-CM | POA: Diagnosis not present

## 2022-10-03 DIAGNOSIS — G8194 Hemiplegia, unspecified affecting left nondominant side: Secondary | ICD-10-CM

## 2022-10-03 DIAGNOSIS — I6300 Cerebral infarction due to thrombosis of unspecified precerebral artery: Secondary | ICD-10-CM

## 2022-10-03 DIAGNOSIS — I69319 Unspecified symptoms and signs involving cognitive functions following cerebral infarction: Secondary | ICD-10-CM

## 2022-10-03 DIAGNOSIS — I69391 Dysphagia following cerebral infarction: Secondary | ICD-10-CM

## 2022-10-03 DIAGNOSIS — E1165 Type 2 diabetes mellitus with hyperglycemia: Secondary | ICD-10-CM | POA: Diagnosis not present

## 2022-10-03 DIAGNOSIS — I152 Hypertension secondary to endocrine disorders: Secondary | ICD-10-CM

## 2022-10-03 DIAGNOSIS — E1159 Type 2 diabetes mellitus with other circulatory complications: Secondary | ICD-10-CM

## 2022-10-03 LAB — POCT GLYCOSYLATED HEMOGLOBIN (HGB A1C): Hemoglobin A1C: 6.5 % — AB (ref 4.0–5.6)

## 2022-10-03 LAB — GLUCOSE, POCT (MANUAL RESULT ENTRY): POC Glucose: 112 mg/dl — AB (ref 70–99)

## 2022-10-03 MED ORDER — DONEPEZIL HCL 10 MG PO TABS: 10.0000 mg | ORAL_TABLET | Freq: Every day | ORAL | 1 refills | Status: DC

## 2022-10-03 MED ORDER — QUETIAPINE FUMARATE 100 MG PO TABS: 100.0000 mg | ORAL_TABLET | Freq: Every day | ORAL | 4 refills | Status: DC

## 2022-10-03 MED ORDER — METFORMIN HCL 1000 MG PO TABS: 1000.0000 mg | ORAL_TABLET | Freq: Two times a day (BID) | ORAL | 1 refills | Status: DC

## 2022-10-03 MED ORDER — ATORVASTATIN CALCIUM 40 MG PO TABS: ORAL_TABLET | ORAL | 2 refills | Status: DC

## 2022-10-03 MED ORDER — LOSARTAN POTASSIUM 50 MG PO TABS: ORAL_TABLET | ORAL | 2 refills | Status: DC

## 2022-10-03 MED ORDER — CHLORTHALIDONE 25 MG PO TABS: 25.0000 mg | ORAL_TABLET | Freq: Every day | ORAL | 1 refills | Status: DC

## 2022-10-03 MED ORDER — AMLODIPINE BESYLATE 10 MG PO TABS: 10.0000 mg | ORAL_TABLET | Freq: Every day | ORAL | 2 refills | Status: DC

## 2022-10-03 NOTE — Patient Instructions (Signed)
Labs for today  No change in medications  Refills sent to [facility]

## 2022-10-04 LAB — COMPREHENSIVE METABOLIC PANEL
ALT: 14 IU/L (ref 0–44)
AST: 16 IU/L (ref 0–40)
Albumin/Globulin Ratio: 1.7 (ref 1.2–2.2)
Albumin: 4.5 g/dL (ref 3.9–4.9)
Alkaline Phosphatase: 71 IU/L (ref 44–121)
BUN/Creatinine Ratio: 13 (ref 10–24)
BUN: 13 mg/dL (ref 8–27)
Bilirubin Total: 0.4 mg/dL (ref 0.0–1.2)
CO2: 25 mmol/L (ref 20–29)
Calcium: 10 mg/dL (ref 8.6–10.2)
Chloride: 99 mmol/L (ref 96–106)
Creatinine, Ser: 1 mg/dL (ref 0.76–1.27)
Globulin, Total: 2.6 g/dL (ref 1.5–4.5)
Glucose: 86 mg/dL (ref 70–99)
Potassium: 4.8 mmol/L (ref 3.5–5.2)
Sodium: 139 mmol/L (ref 134–144)
Total Protein: 7.1 g/dL (ref 6.0–8.5)
eGFR: 81 mL/min/{1.73_m2} (ref >59)

## 2022-10-04 LAB — LIPID PANEL
Chol/HDL Ratio: 2.2 ratio (ref 0.0–5.0)
Cholesterol, Total: 108 mg/dL (ref 100–199)
HDL: 49 mg/dL (ref >39)
LDL Chol Calc (NIH): 47 mg/dL (ref 0–99)
Triglycerides: 52 mg/dL (ref 0–149)
VLDL Cholesterol Cal: 12 mg/dL (ref 5–40)

## 2022-10-04 NOTE — Assessment & Plan Note (Signed)
Improved with therapy no active cough

## 2022-10-04 NOTE — Assessment & Plan Note (Signed)
Type 2 diabetes well-controlled at this time no changes

## 2022-10-04 NOTE — Assessment & Plan Note (Signed)
Blood pressure at goal no changes in medications refills given

## 2022-10-04 NOTE — Assessment & Plan Note (Signed)
Stable at this time no evidence of new deficit

## 2022-10-04 NOTE — Assessment & Plan Note (Signed)
Stable at this time continue prevention therapy

## 2022-10-04 NOTE — Assessment & Plan Note (Signed)
Reassess lipid panel continue therapy

## 2022-10-05 LAB — MICROALBUMIN / CREATININE URINE RATIO
Creatinine, Urine: 52.3 mg/dL
Microalb/Creat Ratio: 9 mg/g creat (ref 0–29)
Microalbumin, Urine: 4.9 ug/mL

## 2022-10-05 NOTE — Progress Notes (Signed)
Call wife tell her urine normal no kidney damage, cholesterol ok  he is doing great

## 2022-10-09 ENCOUNTER — Telehealth: Payer: Self-pay

## 2022-10-09 NOTE — Telephone Encounter (Signed)
-----   Message from Elsie Stain, MD sent at 10/05/2022  7:23 PM EST ----- Call wife tell her urine normal no kidney damage, cholesterol ok  he is doing great

## 2022-10-09 NOTE — Telephone Encounter (Signed)
Pt was called and is aware of results, DOB was confirmed.  ?

## 2022-11-02 ENCOUNTER — Encounter (HOSPITAL_COMMUNITY): Payer: Self-pay

## 2022-11-02 ENCOUNTER — Ambulatory Visit (HOSPITAL_COMMUNITY)
Admission: EM | Admit: 2022-11-02 | Discharge: 2022-11-02 | Disposition: A | Discharge status: Home / Self Care | Payer: Medicare Other | Attending: Emergency Medicine | Admitting: Emergency Medicine

## 2022-11-02 ENCOUNTER — Ambulatory Visit (INDEPENDENT_AMBULATORY_CARE_PROVIDER_SITE_OTHER): Payer: Medicare Other

## 2022-11-02 DIAGNOSIS — R34 Anuria and oliguria: Secondary | ICD-10-CM

## 2022-11-02 LAB — POCT URINALYSIS DIPSTICK, ED / UC
Bilirubin Urine: NEGATIVE
Glucose, UA: NEGATIVE mg/dL
Hgb urine dipstick: NEGATIVE
Ketones, ur: 15 mg/dL — AB
Leukocytes,Ua: NEGATIVE
Nitrite: NEGATIVE
Protein, ur: 30 mg/dL — AB
Specific Gravity, Urine: 1.02 (ref 1.005–1.030)
Urobilinogen, UA: 0.2 mg/dL (ref 0.0–1.0)
pH: 5.5 (ref 5.0–8.0)

## 2022-11-02 MED ORDER — TAMSULOSIN HCL 0.4 MG PO CAPS: 0.4000 mg | ORAL_CAPSULE | Freq: Every day | ORAL | 0 refills | Status: AC

## 2022-11-02 NOTE — ED Provider Notes (Signed)
HPI  SUBJECTIVE:  Joe Perry. is a 72 y.o. male who presents with no urine output overnight.  Wife states that the patient has only urinated twice today, once at 6 AM, and once here.  No noted abdominal swelling.  Patient states he does not feel the urge to urinate, but when he wants to, he has difficulty doing so.  He feels as if he is able to completely empty his bladder when he urinates.  Wife is unsure if there has been any incontinence as patient wears a diaper. No dysuria, urgency, frequency, cloudy or odorous urine, hematuria, back, abdominal, pelvic pain, saddle anesthesia.  No nausea, vomiting, fevers, perineal pain.  He has not changed his intake of water recently.  No recent diarrhea.  No recent change in his medications.  He is not on any new or different over-the-counter medications.   His wife states that his prostate has never been checked.  He has a past medical history of hypertension, diabetes, stroke x 2 with residual aphasia.  No history of BPH, UTI, pyelonephritis, nephrolithiasis, urinary retention, prostatitis.  PCP: In [place].  Urology: None.  Last urine culture in June 22 grew out mixed urogenital flora.  Urine culture before that in October 21 grew out E. coli resistant only to penicillins.  Patient is nonverbal.  All history obtained from wife.  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 ([name])    Diabetes mellitus without complication ([place])    Dysphagia    post stroke   Hypertension    Left upper lobe pneumonia 02/15/2021   Stroke [facility])     Past Surgical History:  Procedure Laterality Date   IR ANGIO INTRA EXTRACRAN SEL COM CAROTID INNOMINATE UNI L MOD SED  03/10/2019   IR ANGIO VERTEBRAL SEL SUBCLAVIAN INNOMINATE BILAT MOD SED  03/10/2019   IR  CT HEAD LTD  03/10/2019   IR PERCUTANEOUS ART THROMBECTOMY/INFUSION INTRACRANIAL INC DIAG ANGIO  03/10/2019   RADIOLOGY WITH ANESTHESIA N/A 03/10/2019   Procedure: IR WITH ANESTHESIA/CODE STROKE;  Surgeon: Radiologist, Medication, MD;  Location: [facility];  Service: Radiology;  Laterality: N/A;    Family History  Problem Relation Age of Onset   Diabetes Other    Hypertension Other    Healthy Mother    Healthy Father     Social History   Tobacco Use   Smoking status: Never   Smokeless tobacco: Never  Vaping Use   Vaping Use: Never used  Substance Use Topics   Alcohol use: No   Drug use: No    No current facility-administered medications for this encounter.  Current Outpatient Medications:    tamsulosin (FLOMAX) 0.4 MG CAPS capsule, Take 1 capsule (0.4 mg total) by mouth at bedtime for 7 days., Disp: 7 capsule, Rfl: 0   acetaminophen (TYLENOL) 325 MG tablet, Take 2 tablets (650 mg total) by mouth every 4 (four) hours as needed for mild pain (or temp > 37.5 C (99.5 F))., Disp:  , Rfl:    amLODipine (NORVASC) 10 MG tablet, Take 1 tablet (10 mg total) by mouth daily. To lower blood pressure, Disp: 90 tablet, Rfl: 2   aspirin EC 81 MG tablet, Take 1 tablet (81 mg total) by mouth daily., Disp: 100 tablet, Rfl: 3   atorvastatin (LIPITOR) 40 MG tablet, TAKE 1  TABLET BY MOUTH IN THE EVENING TO LOWER CHOLESTEROL., Disp: 90 tablet, Rfl: 2   azelastine (ASTELIN) 0.1 % nasal spray, Place 2 sprays into both nostrils 2 (two) times daily. Use in each nostril as directed, Disp: 30 mL, Rfl: 12   cetirizine (ZYRTEC) 10 MG tablet, Take 1 tablet by mouth once daily, Disp: 30 tablet, Rfl: 0   chlorthalidone (HYGROTON) 25 MG tablet, Take 1 tablet (25 mg total) by mouth daily., Disp: 90 tablet, Rfl: 1   donepezil (ARICEPT) 10 MG tablet, Take 1 tablet (10 mg total) by mouth at bedtime., Disp: 90 tablet, Rfl: 1   losartan (COZAAR) 50 MG tablet, 1 tab(s) orally once a day for 30 day(s), Disp: 90 tablet, Rfl: 2    metFORMIN (GLUCOPHAGE) 1000 MG tablet, Take 1 tablet (1,000 mg total) by mouth 2 (two) times daily with a meal., Disp: 180 tablet, Rfl: 1   QUEtiapine (SEROQUEL) 100 MG tablet, Take 1 tablet (100 mg total) by mouth at bedtime., Disp: 30 tablet, Rfl: 4  No Known Allergies   ROS  As noted in HPI.   Physical Exam  BP 111/69   Pulse 74   Temp 98.3 F (36.8 C)   Resp 17   SpO2 97%   Constitutional: Well developed, well nourished, no acute distress Eyes:  EOMI, conjunctiva normal bilaterally HENT: Normocephalic, atraumatic,mucus membranes moist Respiratory: Normal inspiratory effort Cardiovascular: Normal rate GI: nondistended soft, nontender, nondistended.  No palpable bladder.  No flank tenderness. Back: No CVAT Rectal: Normal-sized, firm, nontender prostate.  Wife present during exam. skin: No rash, skin intact Musculoskeletal: no deformities Neurologic: Alert & oriented x 3, no focal neuro deficits Psychiatric: Speech and behavior appropriate   ED Course   Medications - No data to display  Orders Placed This Encounter  Procedures   DG Abd 1 View    Standing Status:   Standing    Number of Occurrences:   1    Order Specific Question:   Reason for Exam (SYMPTOM  OR DIAGNOSIS REQUIRED)    Answer:   r/o neprholithaisis, distended bladder   Ambulatory referral to Urology    Referral Priority:   Urgent    Referral Type:   Consultation    Referral Reason:   Specialty Services Required    Referred to Provider:   Franchot Gallo, MD    Requested Specialty:   Urology    Number of Visits Requested:   1   POC Urinalysis dipstick    Standing Status:   Standing    Number of Occurrences:   1    Results for orders placed or performed during the hospital encounter of 11/02/22 (from the past 24 hour(s))  POC Urinalysis dipstick     Status: Abnormal   Collection Time: 11/02/22  4:11 PM  Result Value Ref Range   Glucose, UA NEGATIVE NEGATIVE mg/dL   Bilirubin Urine NEGATIVE  NEGATIVE   Ketones, ur 15 (A) NEGATIVE mg/dL   Specific Gravity, Urine 1.020 1.005 - 1.030   Hgb urine dipstick NEGATIVE NEGATIVE   pH 5.5 5.0 - 8.0   Protein, ur 30 (A) NEGATIVE mg/dL   Urobilinogen, UA 0.2 0.0 - 1.0 mg/dL   Nitrite NEGATIVE NEGATIVE   Leukocytes,Ua NEGATIVE NEGATIVE   DG Abd 1 View  Result Date: 11/02/2022 CLINICAL DATA:  Penile pain EXAM: ABDOMEN - 1 VIEW COMPARISON:  None Available. FINDINGS: The bowel gas pattern is normal. No radio-opaque calculi or other significant radiographic abnormality are seen. IMPRESSION: Negative.  Electronically Signed   By: Sammie Bench M.D.   On: 11/02/2022 17:22    ED Clinical Impression  1. Decreased urine output      ED Assessment/Plan     Previous records reviewed.  As noted in HPI.  Patient appears comfortable.  I cannot appreciate a distended bladder or abdomen on physical exam.  Prostate feels normal to me.  His UA is negative for hematuria, infection.  Normal specific gravity, thus, I do not think that he is dehydrated.  Wife states that he is drinking normal amounts of fluids..  Acute urinary retention versus bladder obstruction versus urethral obstruction in the differential.  Will get KUB to evaluate for any possible stones and to evaluate for an enlarged bladder.   Reviewed imaging independently.  No radiopaque stones.  See radiology report for full details.  KUB negative for stones.  Starting on Flomax, referring to urology.   Discussed labs, imaging, MDM, treatment plan, and plan for follow-up with family. Discussed sn/sx that should prompt return to the ED. family agrees with plan.   Meds ordered this encounter  Medications   tamsulosin (FLOMAX) 0.4 MG CAPS capsule    Sig: Take 1 capsule (0.4 mg total) by mouth at bedtime for 7 days.    Dispense:  7 capsule    Refill:  0      *This clinic note was created using [employer]. Therefore, there may be occasional mistakes despite careful  proofreading.  ?    Melynda Ripple, MD 11/02/22 [phone]

## 2022-11-02 NOTE — Discharge Instructions (Signed)
His x-ray was negative for kidney stone.  However, he may still have a kidney stone that is not showing up on x-ray.  I am starting him on a medicine called Flomax to help him pass any possible stone and to help with any possible prostate enlargement.  Please follow-up with any one of the providers at [facility] urology ASAP.  I have put in an urgent referral, but I would advise calling them and making an appointment.  Go immediately to the emergency department for altered mental status, vomiting, fevers above 100.4, abdominal or back pain or swelling, or for other concerns.

## 2022-11-02 NOTE — ED Triage Notes (Signed)
Pt presents with complaints of pain in his penis when he urinates that started yesterday. Yesterday he did not pee at all throughout the night. Denies any abdominal or back pain.

## 2022-11-13 NOTE — Progress Notes (Unsigned)
Established Patient Office Visit  Subjective:  Patient ID: Joe Perry., male    DOB: 1951/07/06  Age: 72 y.o. MRN: [id number]  CC:  Weakness decreased oral intake HPI 11/2021 Joe Eddy Sr. presents for hypertension and follow-up.  Patient is accompanied by his spouse.  On arrival blood pressure remains elevated 147/88.  On recheck it is in the same range.  He has history of dysphagia due to recent stroke.  He still coughs when he eats.  Patient maintains low-dose amlodipine.  Patient does main taint atorvastatin and aspirin.  He is now off the Plavix.  Patient also maintaining Aricept for chronic memory loss.  He is fully vaccinated.  On arrival A1c is 6.8.  He maintains the metformin.  There are no other complaints at this time.  5/17 Patient returns in follow-up continues with chronic cough off-and-on related to aspiration and dysphagia.  The patient had 1 coughing paroxysm and developed left conjunctival bleed.  Patient was seen in urgent care May 7 and was just given conservative observation.  Since that time the bleeding has resolved.  Patient has no other complaints at this time.  The wife is pondering may be taking him back to [place] to see his children as they are not able to travel to the [place] due to visa restrictions at this time.  Patient also is not sleeping well at night he is taking the 50 mg of Seroquel only at bedtime  Note at the last visit A1c was at goal and blood pressure today is good 133/85  7/25 Patient seen in return follow-up he is having a bit more coughing with eating but he tends to eat in larger amounts.  The wife would like for him to go back to physical therapy to get him stronger.  On arrival blood pressure is stable 135/81  Patient does have some edema in the lower extremities.  Patient is compliant with amlodipine 10 mg daily and takes aspirin 81 mg daily after stroke.  Patient does maintain the atorvastatin daily.  Metformin is  maintained twice daily.  Patient is sleeping better on Seroquel.  A1c at this visit at goal 7.1   10/03/22 Patient seen in return follow-up overall doing well with no difficulty A1c is at goal at this visit.  Blood pressure on arrival 122/79. The patient has had no falls.  He has had no further evidence of stroke.  Blood glucose has been controlled at home.  He needs follow-up labs at this visit.   11/14/22 Patient seen in return follow-up and has developed some weakness decreased oral intake he had low urine output is now improved.  He had low potassium and was seen in the emergency room recently.  Patient's blood pressure on arrival is low.  He has had no falls.  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 ([place]) 06/03/2019   Cerebrovascular accident (CVA) due to occlusion of vertebral artery ([place])    Diabetes mellitus without complication ([place])    Dysphagia    post stroke   Hypertension    Left upper lobe pneumonia 02/15/2021   Stroke [facility])     Past Surgical History:  Procedure Laterality Date   IR ANGIO INTRA EXTRACRAN SEL COM CAROTID INNOMINATE UNI L MOD SED  03/10/2019   IR ANGIO VERTEBRAL SEL SUBCLAVIAN INNOMINATE BILAT MOD SED  03/10/2019   IR CT  HEAD LTD  03/10/2019   IR PERCUTANEOUS ART THROMBECTOMY/INFUSION INTRACRANIAL INC DIAG ANGIO  03/10/2019   RADIOLOGY WITH ANESTHESIA N/A 03/10/2019   Procedure: IR WITH ANESTHESIA/CODE STROKE;  Surgeon: Radiologist, Medication, MD;  Location: [facility];  Service: Radiology;  Laterality: N/A;    Family History  Problem Relation Age of Onset   Diabetes Other    Hypertension Other    Healthy Mother    Healthy Father     Social History   Socioeconomic History   Marital status: Married    Spouse name: [name]   Number of children: Not on file   Years of education: Not on file   Highest education level: Not on file   Occupational History   Not on file  Tobacco Use   Smoking status: Never   Smokeless tobacco: Never  Vaping Use   Vaping Use: Never used  Substance and Sexual Activity   Alcohol use: No   Drug use: No   Sexual activity: Not Currently  Other Topics Concern   Not on file  Social History Narrative   12/16/19 Lives with wife   Social Determinants of Health   Financial Resource Strain: Not on file  Food Insecurity: No Food Insecurity (05/30/2022)   Hunger Vital Sign    Worried About Running Out of Food in the Last Year: Never true    Ran Out of Food in the Last Year: Never true  Transportation Needs: No Transportation Needs (05/30/2022)   PRAPARE - Hydrologist (Medical): No    Lack of Transportation (Non-Medical): No  Physical Activity: Not on file  Stress: Not on file  Social Connections: Not on file  Intimate Partner Violence: Not on file    Outpatient Medications Prior to Visit  Medication Sig Dispense Refill   acetaminophen (TYLENOL) 325 MG tablet Take 2 tablets (650 mg total) by mouth every 4 (four) hours as needed for mild pain (or temp > 37.5 C (99.5 F)).     aspirin EC 81 MG tablet Take 1 tablet (81 mg total) by mouth daily. 100 tablet 3   atorvastatin (LIPITOR) 40 MG tablet TAKE 1 TABLET BY MOUTH IN THE EVENING TO LOWER CHOLESTEROL. 90 tablet 2   cetirizine (ZYRTEC) 10 MG tablet Take 1 tablet by mouth once daily 30 tablet 0   donepezil (ARICEPT) 10 MG tablet Take 1 tablet (10 mg total) by mouth at bedtime. 90 tablet 1   losartan (COZAAR) 50 MG tablet 1 tab(s) orally once a day for 30 day(s) 90 tablet 2   QUEtiapine (SEROQUEL) 100 MG tablet Take 1 tablet (100 mg total) by mouth at bedtime. 30 tablet 4   amLODipine (NORVASC) 10 MG tablet Take 1 tablet (10 mg total) by mouth daily. To lower blood pressure 90 tablet 2   chlorthalidone (HYGROTON) 25 MG tablet Take 1 tablet (25 mg total) by mouth daily. 90 tablet 1   metFORMIN (GLUCOPHAGE) 1000 MG  tablet Take 1 tablet (1,000 mg total) by mouth 2 (two) times daily with a meal. 180 tablet 1   azelastine (ASTELIN) 0.1 % nasal spray Place 2 sprays into both nostrils 2 (two) times daily. Use in each nostril as directed (Patient not taking: Reported on 11/14/2022) 30 mL 12   No facility-administered medications prior to visit.    No Known Allergies  ROS Review of Systems  Constitutional: Negative.   HENT:  Positive for trouble swallowing. Negative for ear pain, postnasal drip, rhinorrhea, sinus pressure, sore  throat and voice change.   Eyes: Negative.   Respiratory:  Negative for apnea, cough, choking, chest tightness, shortness of breath, wheezing and stridor.   Cardiovascular: Negative.  Negative for chest pain, palpitations and leg swelling.  Gastrointestinal: Negative.  Negative for abdominal distention, abdominal pain, nausea and vomiting.  Genitourinary: Negative.   Musculoskeletal: Negative.  Negative for arthralgias and myalgias.  Skin: Negative.  Negative for rash.  Allergic/Immunologic: Negative.  Negative for environmental allergies and food allergies.  Neurological:  Positive for weakness and light-headedness. Negative for dizziness, syncope and headaches.  Hematological: Negative.  Negative for adenopathy. Does not bruise/bleed easily.  Psychiatric/Behavioral:  Positive for sleep disturbance. Negative for agitation. The patient is not nervous/anxious.       Objective:    Physical Exam Vitals reviewed.  Constitutional:      Appearance: Normal appearance. He is well-developed. He is obese. He is not diaphoretic.     Comments: Somewhat drowsy  HENT:     Head: Normocephalic and atraumatic.     Nose: No nasal deformity, septal deviation, mucosal edema or rhinorrhea.     Right Sinus: No maxillary sinus tenderness or frontal sinus tenderness.     Left Sinus: No maxillary sinus tenderness or frontal sinus tenderness.     Mouth/Throat:     Pharynx: No oropharyngeal exudate.   Eyes:     General: No scleral icterus.    Conjunctiva/sclera: Conjunctivae normal.     Pupils: Pupils are equal, round, and reactive to light.  Neck:     Thyroid: No thyromegaly.     Vascular: No carotid bruit or JVD.     Trachea: Trachea normal. No tracheal tenderness or tracheal deviation.  Cardiovascular:     Rate and Rhythm: Normal rate and regular rhythm.     Chest Wall: PMI is not displaced.     Pulses: Normal pulses. No decreased pulses.     Heart sounds: Normal heart sounds, S1 normal and S2 normal. Heart sounds not distant. No murmur heard.    No systolic murmur is present.     No diastolic murmur is present.     No friction rub. No gallop. No S3 or S4 sounds.  Pulmonary:     Effort: Pulmonary effort is normal. No tachypnea, accessory muscle usage or respiratory distress.     Breath sounds: Normal breath sounds. No stridor. No decreased breath sounds, wheezing, rhonchi or rales.  Chest:     Chest wall: No tenderness.  Abdominal:     General: Bowel sounds are normal. There is no distension.     Palpations: Abdomen is soft. Abdomen is not rigid.     Tenderness: There is no abdominal tenderness. There is no guarding or rebound.  Musculoskeletal:        General: Normal range of motion.     Cervical back: Normal range of motion and neck supple. No edema, erythema or rigidity. No muscular tenderness. Normal range of motion.  Lymphadenopathy:     Head:     Right side of head: No submental or submandibular adenopathy.     Left side of head: No submental or submandibular adenopathy.     Cervical: No cervical adenopathy.  Skin:    General: Skin is warm and dry.     Coloration: Skin is not pale.     Findings: No rash.     Nails: There is no clubbing.  Neurological:     General: No focal deficit present.     Mental Status:  Mental status is at baseline.     Sensory: No sensory deficit.     Motor: Weakness present.     Coordination: Coordination abnormal.     Gait: Gait  abnormal.     Comments: At baseline no new changes  Psychiatric:        Speech: Speech normal.        Behavior: Behavior normal.     BP 118/70   Pulse 69   Temp 98 F (36.7 C)   Wt 177 lb 6.4 oz (80.5 kg)   SpO2 98%   BMI 24.06 kg/m  Wt Readings from Last 3 Encounters:  11/14/22 177 lb 6.4 oz (80.5 kg)  10/03/22 187 lb (84.8 kg)  07/04/22 192 lb 3.2 oz (87.2 kg)     Health Maintenance Due  Topic Date Due   Medicare Annual Wellness (AWV)  Never done   COVID-19 Vaccine (4 - 2023-24 season) 05/05/2022   FOOT EXAM  08/15/2022    There are no preventive care reminders to display for this patient.  Lab Results  Component Value Date   TSH 0.178 (L) 02/15/2021   Lab Results  Component Value Date   WBC 5.3 03/28/2022   HGB 12.1 (L) 03/28/2022   HCT 36.3 (L) 03/28/2022   MCV 92 03/28/2022   PLT 219 03/28/2022   Lab Results  Component Value Date   NA 139 10/03/2022   K 4.8 10/03/2022   CO2 25 10/03/2022   GLUCOSE 86 10/03/2022   BUN 13 10/03/2022   CREATININE 1.00 10/03/2022   BILITOT 0.4 10/03/2022   ALKPHOS 71 10/03/2022   AST 16 10/03/2022   ALT 14 10/03/2022   PROT 7.1 10/03/2022   ALBUMIN 4.5 10/03/2022   CALCIUM 10.0 10/03/2022   ANIONGAP 8 02/17/2021   EGFR 81 10/03/2022   Lab Results  Component Value Date   CHOL 108 10/03/2022   Lab Results  Component Value Date   HDL 49 10/03/2022   Lab Results  Component Value Date   LDLCALC 47 10/03/2022   Lab Results  Component Value Date   TRIG 52 10/03/2022   Lab Results  Component Value Date   CHOLHDL 2.2 10/03/2022   Lab Results  Component Value Date   HGBA1C 6.5 (A) 10/03/2022      Assessment & Plan:   Problem List Items Addressed This Visit       Cardiovascular and Mediastinum   Benign essential HTN - Primary (Chronic)    Patient likely on too much medication plan to reduce dose of medication and follow closely will reduce chlorthalidone and amlodipine in half and check metabolic  panel      Relevant Medications   amLODipine (NORVASC) 5 MG tablet   chlorthalidone (HYGROTEN) 15 MG tablet   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, d/t large vessel dz    No clinical evidence of recurrent stroke      Relevant Medications   amLODipine (NORVASC) 5 MG tablet   chlorthalidone (HYGROTEN) 15 MG tablet     Endocrine   Controlled type 2 diabetes mellitus with hyperglycemia, without long-term current use of insulin ([name]) (Chronic)    Reduce metformin to 1 daily      Relevant Medications   metFORMIN (GLUCOPHAGE) 1000 MG tablet   Other Visit Diagnoses     Essential hypertension       Relevant Medications   amLODipine (NORVASC) 5 MG tablet   chlorthalidone (HYGROTEN) 15 MG tablet   Hypertension associated  with type 2 diabetes mellitus ([name])       Relevant Medications   amLODipine (NORVASC) 5 MG tablet   chlorthalidone (HYGROTEN) 15 MG tablet   metFORMIN (GLUCOPHAGE) 1000 MG tablet   Type 2 diabetes mellitus with other circulatory complication, without long-term current use of insulin ([name])       Relevant Medications   amLODipine (NORVASC) 5 MG tablet   chlorthalidone (HYGROTEN) 15 MG tablet   metFORMIN (GLUCOPHAGE) 1000 MG tablet      Meds ordered this encounter  Medications   amLODipine (NORVASC) 5 MG tablet    Sig: Take 1 tablet (5 mg total) by mouth daily. To lower blood pressure    Dispense:  90 tablet    Refill:  2    Next refill will take 1/2 '10mg'$  amlodipine a day in interim   chlorthalidone (HYGROTEN) 15 MG tablet    Sig: Take 1 tablet (15 mg total) by mouth daily.    Dispense:  90 tablet    Refill:  2    Future refill dose change, will take 1/2 '25mg'$  until next refill   metFORMIN (GLUCOPHAGE) 1000 MG tablet    Sig: Take 1 tablet (1,000 mg total) by mouth daily with breakfast.    Dispense:  90 tablet    Refill:  2    Future refill    Follow-up: Return in about 6 weeks (around 12/26/2022) for htn.    Asencion Noble, MD

## 2022-11-14 ENCOUNTER — Ambulatory Visit: Payer: Medicare Other | Attending: Critical Care Medicine | Admitting: Critical Care Medicine

## 2022-11-14 ENCOUNTER — Encounter: Payer: Self-pay | Admitting: Critical Care Medicine

## 2022-11-14 VITALS — BP 118/70 | HR 69 | Temp 98.0°F | Wt 177.4 lb

## 2022-11-14 DIAGNOSIS — I6300 Cerebral infarction due to thrombosis of unspecified precerebral artery: Secondary | ICD-10-CM | POA: Diagnosis not present

## 2022-11-14 DIAGNOSIS — E1165 Type 2 diabetes mellitus with hyperglycemia: Secondary | ICD-10-CM | POA: Diagnosis not present

## 2022-11-14 DIAGNOSIS — E1159 Type 2 diabetes mellitus with other circulatory complications: Secondary | ICD-10-CM | POA: Diagnosis not present

## 2022-11-14 DIAGNOSIS — I152 Hypertension secondary to endocrine disorders: Secondary | ICD-10-CM

## 2022-11-14 DIAGNOSIS — I10 Essential (primary) hypertension: Secondary | ICD-10-CM

## 2022-11-14 MED ORDER — AMLODIPINE BESYLATE 5 MG PO TABS: 5.0000 mg | ORAL_TABLET | Freq: Every day | ORAL | 2 refills | Status: DC

## 2022-11-14 MED ORDER — METFORMIN HCL 1000 MG PO TABS: 1000.0000 mg | ORAL_TABLET | Freq: Every day | ORAL | 2 refills | Status: DC

## 2022-11-14 MED ORDER — CHLORTHALIDONE 15 MG PO TABS: 15.0000 mg | ORAL_TABLET | Freq: Every day | ORAL | 2 refills | Status: DC

## 2022-11-14 NOTE — Patient Instructions (Signed)
Reduce metformin to one pill daily Reduce amlodipine to 1/2 tablet daily, next refill will be one '5mg'$  tablet daily Reduce chlorthalidone to 1/2 tablet daily, next refill will be a 15 mg tablet daily No other medication changes  Return recheck 6 weeks

## 2022-11-14 NOTE — Assessment & Plan Note (Signed)
Reduce metformin to 1 daily

## 2022-11-14 NOTE — Assessment & Plan Note (Signed)
No clinical evidence of recurrent stroke

## 2022-11-14 NOTE — Assessment & Plan Note (Signed)
Patient likely on too much medication plan to reduce dose of medication and follow closely will reduce chlorthalidone and amlodipine in half and check metabolic panel

## 2022-11-25 ENCOUNTER — Other Ambulatory Visit: Payer: Self-pay | Admitting: Critical Care Medicine

## 2022-11-25 DIAGNOSIS — R058 Other specified cough: Secondary | ICD-10-CM

## 2022-11-27 NOTE — Telephone Encounter (Signed)
Requested Prescriptions  Pending Prescriptions Disp Refills   cetirizine (ZYRTEC) 10 MG tablet [Pharmacy Med Name: Cetirizine HCl 10 MG Oral Tablet] 30 tablet 0    Sig: Take 1 tablet by mouth once daily     Ear, Nose, and Throat:  Antihistamines 2 Passed - 11/25/2022  2:05 PM      Passed - Cr in normal range and within 360 days    Creat  Date Value Ref Range Status  09/08/2013 0.96 0.50 - 1.35 mg/dL Final   Creatinine, Ser  Date Value Ref Range Status  10/03/2022 1.00 0.76 - 1.27 mg/dL Final         Passed - Valid encounter within last 12 months    Recent Outpatient Visits           1 week ago Benign essential HTN   [place], MD   1 month ago Controlled type 2 diabetes mellitus with hyperglycemia, without long-term current use of insulin [facility])   [place] Elsie Stain, MD   3 months ago Type 2 diabetes mellitus with other circulatory complication, without long-term current use of insulin [facility])   [place], [place] L, RPH-CPP   4 months ago Type 2 diabetes mellitus with other circulatory complication, without long-term current use of insulin [facility])   [place] Karle Plumber B, MD   8 months ago Controlled type 2 diabetes mellitus with hyperglycemia, without long-term current use of insulin [facility])   [place] Elsie Stain, MD       Future Appointments             In 3 weeks Elsie Stain, MD [place]

## 2022-12-04 ENCOUNTER — Telehealth: Payer: Self-pay | Admitting: Critical Care Medicine

## 2022-12-04 NOTE — Telephone Encounter (Signed)
[place] to schedule their annual wellness visit. Appointment made for 12/06/22.  [place] [employer] AWV direct phone # [phone]

## 2022-12-06 ENCOUNTER — Ambulatory Visit: Payer: Medicare Other | Attending: Critical Care Medicine

## 2022-12-06 VITALS — Ht 76.0 in | Wt 172.0 lb

## 2022-12-06 DIAGNOSIS — Z0000 Encounter for general adult medical examination without abnormal findings: Secondary | ICD-10-CM

## 2022-12-06 NOTE — Progress Notes (Signed)
I connected with  Joe Eddy Sr. on 12/06/22 by a audio enabled telemedicine application and verified that I am speaking with the correct person using two identifiers. Wife also present on call.  Patient Location: Home  Provider Location: Office/Clinic  I discussed the limitations of evaluation and management by telemedicine. The patient expressed understanding and agreed to proceed.  Subjective:   Joe Eddy Sr. is a 72 y.o. male who presents for an Initial Medicare Annual Wellness Visit.  Review of Systems     Cardiac Risk Factors include: advanced age (>35men, >86 women);diabetes mellitus;dyslipidemia;male gender     Objective:    Today's Vitals   12/06/22 1627  Weight: 172 lb (78 kg)  Height: 6\' 4"  (1.93 m)   Body mass index is 20.94 kg/m.     12/06/2022    4:33 PM 05/09/2022    2:02 PM 04/07/2022   12:38 PM 02/16/2021    3:24 PM 10/25/2020    9:41 AM 03/22/2020    5:52 PM 11/04/2019    1:46 PM  Advanced Directives  Does Patient Have a Medical Advance Directive? No Yes Yes Unable to assess, patient is non-responsive or altered mental status Yes Yes Yes  Type of Advance Directive  Healthcare Power of [place]  Does patient want to make changes to medical advance directive?  No - Patient declined   No - Patient declined No - Patient declined No - Patient declined  Copy of [place] in Chart?  No - copy requested   No - copy requested No - copy requested     Current Medications (verified) Outpatient Encounter Medications as of 12/06/2022  Medication Sig   acetaminophen (TYLENOL) 325 MG tablet Take 2 tablets (650 mg total) by mouth every 4 (four) hours as needed for mild pain (or temp > 37.5 C (99.5 F)).   amLODipine (NORVASC) 5 MG tablet Take 1 tablet (5 mg total) by mouth daily. To lower blood pressure   aspirin EC 81 MG tablet Take 1 tablet (81 mg  total) by mouth daily.   atorvastatin (LIPITOR) 40 MG tablet TAKE 1 TABLET BY MOUTH IN THE EVENING TO LOWER CHOLESTEROL.   cetirizine (ZYRTEC) 10 MG tablet Take 1 tablet by mouth once daily   chlorthalidone (HYGROTEN) 15 MG tablet Take 1 tablet (15 mg total) by mouth daily.   donepezil (ARICEPT) 10 MG tablet Take 1 tablet (10 mg total) by mouth at bedtime.   losartan (COZAAR) 50 MG tablet 1 tab(s) orally once a day for 30 day(s)   metFORMIN (GLUCOPHAGE) 1000 MG tablet Take 1 tablet (1,000 mg total) by mouth daily with breakfast.   QUEtiapine (SEROQUEL) 100 MG tablet Take 1 tablet (100 mg total) by mouth at bedtime.   azelastine (ASTELIN) 0.1 % nasal spray Place 2 sprays into both nostrils 2 (two) times daily. Use in each nostril as directed (Patient not taking: Reported on 11/14/2022)   No facility-administered encounter medications on file as of 12/06/2022.    Allergies (verified) Patient has no known allergies.   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    Diabetes mellitus without complication    Dysphagia    post stroke   Hypertension  Left upper lobe pneumonia 02/15/2021   Stroke    Past Surgical History:  Procedure Laterality Date   IR ANGIO INTRA EXTRACRAN SEL COM CAROTID INNOMINATE UNI L MOD SED  03/10/2019   IR ANGIO VERTEBRAL SEL SUBCLAVIAN INNOMINATE BILAT MOD SED  03/10/2019   IR CT HEAD LTD  03/10/2019   IR PERCUTANEOUS ART THROMBECTOMY/INFUSION INTRACRANIAL INC DIAG ANGIO  03/10/2019   RADIOLOGY WITH ANESTHESIA N/A 03/10/2019   Procedure: IR WITH ANESTHESIA/CODE STROKE;  Surgeon: Radiologist, Medication, MD;  Location: [facility];  Service: Radiology;  Laterality: N/A;   Family History  Problem Relation Age of Onset   Diabetes Other    Hypertension Other     Healthy Mother    Healthy Father    Social History   Socioeconomic History   Marital status: Married    Spouse name: [name]   Number of children: Not on file   Years of education: Not on file   Highest education level: Not on file  Occupational History   Not on file  Tobacco Use   Smoking status: Never   Smokeless tobacco: Never  Vaping Use   Vaping Use: Never used  Substance and Sexual Activity   Alcohol use: No   Drug use: No   Sexual activity: Not Currently  Other Topics Concern   Not on file  Social History Narrative   12/16/19 Lives with wife   Social Determinants of Health   Financial Resource Strain: Low Risk  (12/06/2022)   Overall Financial Resource Strain (CARDIA)    Difficulty of Paying Living Expenses: Not hard at all  Food Insecurity: No Food Insecurity (12/06/2022)   Hunger Vital Sign    Worried About Running Out of Food in the Last Year: Never true    Strawberry in the Last Year: Never true  Transportation Needs: No Transportation Needs (12/06/2022)   PRAPARE - Hydrologist (Medical): No    Lack of Transportation (Non-Medical): No  Physical Activity: Inactive (12/06/2022)   Exercise Vital Sign    Days of Exercise per Week: 0 days    Minutes of Exercise per Session: 0 min  Stress: No Stress Concern Present (12/06/2022)   [place]    Feeling of Stress : Not at all  Social Connections: Not on file    Tobacco Counseling Counseling given: Not Answered   Clinical Intake:  Pre-visit preparation completed: Yes  Pain : No/denies pain     Nutritional Status: BMI of 19-24  Normal Nutritional Risks: None Diabetes: Yes  How often do you need to have someone help you when you read instructions, pamphlets, or other written materials from your doctor or pharmacy?: 5 - Always  Diabetic? Yes Nutrition Risk Assessment:  Has the patient had any N/V/D within the  last 2 months?  No  Does the patient have any non-healing wounds?  No  Has the patient had any unintentional weight loss or weight gain?  Yes   Diabetes:  Is the patient diabetic?  Yes  If diabetic, was a CBG obtained today?  No  Did the patient bring in their glucometer from home?  No  How often do you monitor your CBG's? Twice weekly.   Financial Strains and Diabetes Management:  Are you having any financial strains with the device, your supplies or your medication? No .  Does the patient want to be seen by Chronic Care Management for management of their  diabetes?  No  Would the patient like to be referred to a Nutritionist or for Diabetic Management?  No   Diabetic Exams:  Diabetic Eye Exam: Overdue for diabetic eye exam. Pt has been advised about the importance in completing this exam. Patient advised to call and schedule an eye exam. Diabetic Foot Exam: Overdue, Pt has been advised about the importance in completing this exam. Pt is scheduled for diabetic foot exam on next appointment.   Interpreter Needed?: No  Information entered by :: NAllen LPN   Activities of Daily Living    12/06/2022    4:34 PM  In your present state of health, do you have any difficulty performing the following activities:  Hearing? 0  Vision? 0  Difficulty concentrating or making decisions? 1  Walking or climbing stairs? 0  Dressing or bathing? 1  Doing errands, shopping? 0  Preparing Food and eating ? Y  Using the Toilet? Y  In the past six months, have you accidently leaked urine? Y  Do you have problems with loss of bowel control? Y  Managing your Medications? Y  Managing your Finances? Y  Housekeeping or managing your Housekeeping? Y    Patient Care Team: Elsie Stain, MD as PCP - General (Pulmonary Disease)  Indicate any recent Medical Services you may have received from other than [facility] providers in the past year (date may be approximate).     Assessment:   This is a  routine wellness examination for [place].  Hearing/Vision screen Vision Screening - Comments:: No regular eye exams  Dietary issues and exercise activities discussed: Current Exercise Habits: The patient does not participate in regular exercise at present   Goals Addressed             This Visit's Progress    Patient Stated       12/06/2022, wife wants him to drink more water       Depression Screen    12/06/2022    4:34 PM 11/14/2022    9:41 AM 10/03/2022    5:33 PM 07/04/2022    2:20 PM 03/28/2022    3:54 PM 11/14/2021    2:32 PM 08/15/2021    2:58 PM  PHQ 2/9 Scores  PHQ - 2 Score 0 0 0 0 0 0 0  PHQ- 9 Score  0 0        Fall Risk    12/06/2022    4:33 PM 10/03/2022    5:32 PM 07/04/2022    2:20 PM 03/28/2022    3:54 PM 01/18/2022    9:35 AM  Fall Risk   Falls in the past year? 0 0 0 0 0  Number falls in past yr: 0  0 0 0  Injury with Fall? 0  0 0 0  Risk for fall due to : Impaired mobility;Impaired balance/gait;Medication side effect History of fall(s) No Fall Risks No Fall Risks No Fall Risks  Follow up Falls prevention discussed;Education provided;Falls evaluation completed        FALL RISK PREVENTION PERTAINING TO THE HOME:  Any stairs in or around the home? Yes  If so, are there any without handrails? No  Home free of loose throw rugs in walkways, pet beds, electrical cords, etc? Yes  Adequate lighting in your home to reduce risk of falls? Yes   ASSISTIVE DEVICES UTILIZED TO PREVENT FALLS:  Life alert? No  Use of a cane, walker or w/c? Yes  Grab bars in the bathroom? Yes  Shower chair or bench in shower? Yes  Elevated toilet seat or a handicapped toilet? Yes   TIMED UP AND GO:  Was the test performed? No .       Cognitive Function:  6 CIT not administered. Spouse states that he does not talk.        Immunizations Immunization History  Administered Date(s) Administered   Fluad Quad(high Dose 65+) 06/20/2019   Influenza,inj,Quad PF,6+ Mos  05/25/2020, 05/16/2021, 07/04/2022   PFIZER(Purple Top)SARS-COV-2 Vaccination 10/25/2019, 11/18/2019, 06/25/2020   PNEUMOCOCCAL CONJUGATE-20 08/15/2021   Pneumococcal Conjugate-13 09/03/2019   Tdap 09/03/2019    TDAP status: Up to date  Flu Vaccine status: Up to date  Pneumococcal vaccine status: Up to date  Covid-19 vaccine status: Completed vaccines  Qualifies for Shingles Vaccine? Yes   Zostavax completed No   Shingrix Completed?: No.    Education has been provided regarding the importance of this vaccine. Patient has been advised to call insurance company to determine out of pocket expense if they have not yet received this vaccine. Advised may also receive vaccine at local pharmacy or Health Dept. Verbalized acceptance and understanding.  Screening Tests Health Maintenance  Topic Date Due   Medicare Annual Wellness (AWV)  Never done   COVID-19 Vaccine (4 - 2023-24 season) 05/05/2022   FOOT EXAM  08/15/2022   HEMOGLOBIN A1C  04/03/2023   INFLUENZA VACCINE  04/05/2023   Diabetic kidney evaluation - eGFR measurement  10/04/2023   Diabetic kidney evaluation - Urine ACR  10/04/2023   DTaP/Tdap/Td (2 - Td or Tdap) 09/02/2029   Pneumonia Vaccine 27+ Years old  Completed   Hepatitis C Screening  Completed   HPV VACCINES  Aged Out   OPHTHALMOLOGY EXAM  Discontinued   COLONOSCOPY (Pts 45-33yrs Insurance coverage will need to be confirmed)  Discontinued   Zoster Vaccines- Shingrix  Discontinued    Health Maintenance  Health Maintenance Due  Topic Date Due   Medicare Annual Wellness (AWV)  Never done   COVID-19 Vaccine (4 - 2023-24 season) 05/05/2022   FOOT EXAM  08/15/2022    Colorectal cancer screening: No longer required.   Lung Cancer Screening: (Low Dose CT Chest recommended if Age 72-80 years, 30 pack-year currently smoking OR have quit w/in 15years.) does not qualify.   Lung Cancer Screening Referral: no  Additional Screening:  Hepatitis C Screening: does  qualify; Completed 09/03/2019  Vision Screening: Recommended annual ophthalmology exams for early detection of glaucoma and other disorders of the eye. Is the patient up to date with their annual eye exam?  No  Who is the provider or what is the name of the office in which the patient attends annual eye exams? none If pt is not established with a provider, would they like to be referred to a provider to establish care? No .   Dental Screening: Recommended annual dental exams for proper oral hygiene  Community Resource Referral / Chronic Care Management: CRR required this visit?  No   CCM required this visit?  No      Plan:     I have personally reviewed and noted the following in the patient's chart:   Medical and social history Use of alcohol, tobacco or illicit drugs  Current medications and supplements including opioid prescriptions. Patient is not currently taking opioid prescriptions. Functional ability and status Nutritional status Physical activity Advanced directives List of other physicians Hospitalizations, surgeries, and ER visits in previous 12 months Vitals Screenings to include cognitive, depression, and falls  Referrals and appointments  In addition, I have reviewed and discussed with patient certain preventive protocols, quality metrics, and best practice recommendations. A written personalized care plan for preventive services as well as general preventive health recommendations were provided to patient.     Kellie Simmering, LPN   [id number]   Nurse Notes: none  Due to this being a virtual visit, the after visit summary with patients personalized plan was offered to patient via mail or my-chart.  Patient would like to access on my-chart

## 2022-12-06 NOTE — Patient Instructions (Addendum)
[name] , Thank you for taking time to come for your Medicare Wellness Visit. I appreciate your ongoing commitment to your health goals. Please review the following plan we discussed and let me know if I can assist you in the future.   These are the goals we discussed:  Goals      Patient Stated     12/06/2022, wife wants him to drink more water        This is a list of the screening recommended for you and due dates:  Health Maintenance  Topic Date Due   COVID-19 Vaccine (4 - 2023-24 season) 05/05/2022   Complete foot exam   08/15/2022   Hemoglobin A1C  04/03/2023   Flu Shot  04/05/2023   Yearly kidney function blood test for diabetes  10/04/2023   Yearly kidney health urinalysis for diabetes  10/04/2023   Medicare Annual Wellness Visit  12/06/2023   DTaP/Tdap/Td vaccine (2 - Td or Tdap) 09/02/2029   Pneumonia Vaccine  Completed   Hepatitis C Screening: USPSTF Recommendation to screen - Ages 58-79 yo.  Completed   HPV Vaccine  Aged Out   Eye exam for diabetics  Discontinued   Colon Cancer Screening  Discontinued   Zoster (Shingles) Vaccine  Discontinued    Advanced directives: Advance directive discussed with you today.   Conditions/risks identified: none  Next appointment: Follow up in one year for your annual wellness visit.   Preventive Care 55 Years and Older, Male  Preventive care refers to lifestyle choices and visits with your health care provider that can promote health and wellness. What does preventive care include? A yearly physical exam. This is also called an annual well check. Dental exams once or twice a year. Routine eye exams. Ask your health care provider how often you should have your eyes checked. Personal lifestyle choices, including: Daily care of your teeth and gums. Regular physical activity. Eating a healthy diet. Avoiding tobacco and drug use. Limiting alcohol use. Practicing safe sex. Taking low doses of aspirin every day. Taking vitamin  and mineral supplements as recommended by your health care provider. What happens during an annual well check? The services and screenings done by your health care provider during your annual well check will depend on your age, overall health, lifestyle risk factors, and family history of disease. Counseling  Your health care provider may ask you questions about your: Alcohol use. Tobacco use. Drug use. Emotional well-being. Home and relationship well-being. Sexual activity. Eating habits. History of falls. Memory and ability to understand (cognition). Work and work Statistician. Screening  You may have the following tests or measurements: Height, weight, and BMI. Blood pressure. Lipid and cholesterol levels. These may be checked every 5 years, or more frequently if you are over 48 years old. Skin check. Lung cancer screening. You may have this screening every year starting at age 44 if you have a 30-pack-year history of smoking and currently smoke or have quit within the past 15 years. Fecal occult blood test (FOBT) of the stool. You may have this test every year starting at age 68. Flexible sigmoidoscopy or colonoscopy. You may have a sigmoidoscopy every 5 years or a colonoscopy every 10 years starting at age 69. Prostate cancer screening. Recommendations will vary depending on your family history and other risks. Hepatitis C blood test. Hepatitis B blood test. Sexually transmitted disease (STD) testing. Diabetes screening. This is done by checking your blood sugar (glucose) after you have not eaten for a  while (fasting). You may have this done every 1-3 years. Abdominal aortic aneurysm (AAA) screening. You may need this if you are a current or former smoker. Osteoporosis. You may be screened starting at age 39 if you are at high risk. Talk with your health care provider about your test results, treatment options, and if necessary, the need for more tests. Vaccines  Your health care  provider may recommend certain vaccines, such as: Influenza vaccine. This is recommended every year. Tetanus, diphtheria, and acellular pertussis (Tdap, Td) vaccine. You may need a Td booster every 10 years. Zoster vaccine. You may need this after age 60. Pneumococcal 13-valent conjugate (PCV13) vaccine. One dose is recommended after age 37. Pneumococcal polysaccharide (PPSV23) vaccine. One dose is recommended after age 44. Talk to your health care provider about which screenings and vaccines you need and how often you need them. This information is not intended to replace advice given to you by your health care provider. Make sure you discuss any questions you have with your health care provider. Document Released: 09/17/2015 Document Revised: 05/10/2016 Document Reviewed: 06/22/2015 Elsevier Interactive Patient Education  2017 [employer] Prevention in the Home Falls can cause injuries. They can happen to people of all ages. There are many things you can do to make your home safe and to help prevent falls. What can I do on the outside of my home? Regularly fix the edges of walkways and driveways and fix any cracks. Remove anything that might make you trip as you walk through a door, such as a raised step or threshold. Trim any bushes or trees on the path to your home. Use bright outdoor lighting. Clear any walking paths of anything that might make someone trip, such as rocks or tools. Regularly check to see if handrails are loose or broken. Make sure that both sides of any steps have handrails. Any raised decks and porches should have guardrails on the edges. Have any leaves, snow, or ice cleared regularly. Use sand or salt on walking paths during winter. Clean up any spills in your garage right away. This includes oil or grease spills. What can I do in the bathroom? Use night lights. Install grab bars by the toilet and in the tub and shower. Do not use towel bars as grab  bars. Use non-skid mats or decals in the tub or shower. If you need to sit down in the shower, use a plastic, non-slip stool. Keep the floor dry. Clean up any water that spills on the floor as soon as it happens. Remove soap buildup in the tub or shower regularly. Attach bath mats securely with double-sided non-slip rug tape. Do not have throw rugs and other things on the floor that can make you trip. What can I do in the bedroom? Use night lights. Make sure that you have a light by your bed that is easy to reach. Do not use any sheets or blankets that are too big for your bed. They should not hang down onto the floor. Have a firm chair that has side arms. You can use this for support while you get dressed. Do not have throw rugs and other things on the floor that can make you trip. What can I do in the kitchen? Clean up any spills right away. Avoid walking on wet floors. Keep items that you use a lot in easy-to-reach places. If you need to reach something above you, use a strong step stool that has a grab  bar. Keep electrical cords out of the way. Do not use floor polish or wax that makes floors slippery. If you must use wax, use non-skid floor wax. Do not have throw rugs and other things on the floor that can make you trip. What can I do with my stairs? Do not leave any items on the stairs. Make sure that there are handrails on both sides of the stairs and use them. Fix handrails that are broken or loose. Make sure that handrails are as long as the stairways. Check any carpeting to make sure that it is firmly attached to the stairs. Fix any carpet that is loose or worn. Avoid having throw rugs at the top or bottom of the stairs. If you do have throw rugs, attach them to the floor with carpet tape. Make sure that you have a light switch at the top of the stairs and the bottom of the stairs. If you do not have them, ask someone to add them for you. What else can I do to help prevent  falls? Wear shoes that: Do not have high heels. Have rubber bottoms. Are comfortable and fit you well. Are closed at the toe. Do not wear sandals. If you use a stepladder: Make sure that it is fully opened. Do not climb a closed stepladder. Make sure that both sides of the stepladder are locked into place. Ask someone to hold it for you, if possible. Clearly mark and make sure that you can see: Any grab bars or handrails. First and last steps. Where the edge of each step is. Use tools that help you move around (mobility aids) if they are needed. These include: Canes. Walkers. Scooters. Crutches. Turn on the lights when you go into a dark area. Replace any light bulbs as soon as they burn out. Set up your furniture so you have a clear path. Avoid moving your furniture around. If any of your floors are uneven, fix them. If there are any pets around you, be aware of where they are. Review your medicines with your doctor. Some medicines can make you feel dizzy. This can increase your chance of falling. Ask your doctor what other things that you can do to help prevent falls. This information is not intended to replace advice given to you by your health care provider. Make sure you discuss any questions you have with your health care provider. Document Released: 06/17/2009 Document Revised: 01/27/2016 Document Reviewed: 09/25/2014 Elsevier Interactive Patient Education  2017 [employer].

## 2022-12-18 NOTE — Progress Notes (Unsigned)
Established Patient Office Visit  Subjective:  Patient ID: Joe Perry., male    DOB: 30-Nov-1950  Age: 72 y.o. MRN: [id number]  CC:  Weakness decreased oral intake HPI 11/2021 Avon Gully Sr. presents for hypertension and follow-up.  Patient is accompanied by his spouse.  On arrival blood pressure remains elevated 147/88.  On recheck it is in the same range.  He has history of dysphagia due to recent stroke.  He still coughs when he eats.  Patient maintains low-dose amlodipine.  Patient does main taint atorvastatin and aspirin.  He is now off the Plavix.  Patient also maintaining Aricept for chronic memory loss.  He is fully vaccinated.  On arrival A1c is 6.8.  He maintains the metformin.  There are no other complaints at this time.  5/17 Patient returns in follow-up continues with chronic cough off-and-on related to aspiration and dysphagia.  The patient had 1 coughing paroxysm and developed left conjunctival bleed.  Patient was seen in urgent care May 7 and was just given conservative observation.  Since that time the bleeding has resolved.  Patient has no other complaints at this time.  The wife is pondering may be taking him back to [place] to see his children as they are not able to travel to the [place] due to visa restrictions at this time.  Patient also is not sleeping well at night he is taking the 50 mg of Seroquel only at bedtime  Note at the last visit A1c was at goal and blood pressure today is good 133/85  7/25 Patient seen in return follow-up he is having a bit more coughing with eating but he tends to eat in larger amounts.  The wife would like for him to go back to physical therapy to get him stronger.  On arrival blood pressure is stable 135/81  Patient does have some edema in the lower extremities.  Patient is compliant with amlodipine 10 mg daily and takes aspirin 81 mg daily after stroke.  Patient does maintain the atorvastatin daily.  Metformin is  maintained twice daily.  Patient is sleeping better on Seroquel.  A1c at this visit at goal 7.1   10/03/22 Patient seen in return follow-up overall doing well with no difficulty A1c is at goal at this visit.  Blood pressure on arrival 122/79. The patient has had no falls.  He has had no further evidence of stroke.  Blood glucose has been controlled at home.  He needs follow-up labs at this visit.   11/14/22 Patient seen in return follow-up and has developed some weakness decreased oral intake he had low urine output is now improved.  He had low potassium and was seen in the emergency room recently.  Patient's blood pressure on arrival is low.  He has had no falls.  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    Diabetes mellitus without complication    Dysphagia    post stroke   Hypertension    Left upper lobe pneumonia 02/15/2021   Stroke     Past Surgical History:  Procedure Laterality Date   IR ANGIO INTRA EXTRACRAN SEL COM CAROTID INNOMINATE UNI L MOD SED  03/10/2019   IR ANGIO VERTEBRAL SEL SUBCLAVIAN INNOMINATE BILAT MOD SED  03/10/2019   IR CT HEAD LTD  03/10/2019  IR PERCUTANEOUS ART THROMBECTOMY/INFUSION INTRACRANIAL INC DIAG ANGIO  03/10/2019   RADIOLOGY WITH ANESTHESIA N/A 03/10/2019   Procedure: IR WITH ANESTHESIA/CODE STROKE;  Surgeon: Radiologist, Medication, MD;  Location: [facility] OR;  Service: Radiology;  Laterality: N/A;    Family History  Problem Relation Age of Onset   Diabetes Other    Hypertension Other    Healthy Mother    Healthy Father     Social History   Socioeconomic History   Marital status: Married    Spouse name: [name]   Number of children: Not on file   Years of education: Not on file   Highest education level: Not on file  Occupational History    Not on file  Tobacco Use   Smoking status: Never   Smokeless tobacco: Never  Vaping Use   Vaping Use: Never used  Substance and Sexual Activity   Alcohol use: No   Drug use: No   Sexual activity: Not Currently  Other Topics Concern   Not on file  Social History Narrative   12/16/19 Lives with wife   Social Determinants of Health   Financial Resource Strain: Low Risk  (12/06/2022)   Overall Financial Resource Strain (CARDIA)    Difficulty of Paying Living Expenses: Not hard at all  Food Insecurity: No Food Insecurity (12/06/2022)   Hunger Vital Sign    Worried About Running Out of Food in the Last Year: Never true    Ran Out of Food in the Last Year: Never true  Transportation Needs: No Transportation Needs (12/06/2022)   PRAPARE - Administrator, Civil Service (Medical): No    Lack of Transportation (Non-Medical): No  Physical Activity: Inactive (12/06/2022)   Exercise Vital Sign    Days of Exercise per Week: 0 days    Minutes of Exercise per Session: 0 min  Stress: No Stress Concern Present (12/06/2022)   [employer] of Occupational Health - Occupational Stress Questionnaire    Feeling of Stress : Not at all  Social Connections: Not on file  Intimate Partner Violence: Not on file    Outpatient Medications Prior to Visit  Medication Sig Dispense Refill   acetaminophen (TYLENOL) 325 MG tablet Take 2 tablets (650 mg total) by mouth every 4 (four) hours as needed for mild pain (or temp > 37.5 C (99.5 F)).     amLODipine (NORVASC) 5 MG tablet Take 1 tablet (5 mg total) by mouth daily. To lower blood pressure 90 tablet 2   aspirin EC 81 MG tablet Take 1 tablet (81 mg total) by mouth daily. 100 tablet 3   atorvastatin (LIPITOR) 40 MG tablet TAKE 1 TABLET BY MOUTH IN THE EVENING TO LOWER CHOLESTEROL. 90 tablet 2   azelastine (ASTELIN) 0.1 % nasal spray Place 2 sprays into both nostrils 2 (two) times daily. Use in each nostril as directed (Patient not taking: Reported on  11/14/2022) 30 mL 12   cetirizine (ZYRTEC) 10 MG tablet Take 1 tablet by mouth once daily 30 tablet 0   chlorthalidone (HYGROTEN) 15 MG tablet Take 1 tablet (15 mg total) by mouth daily. 90 tablet 2   donepezil (ARICEPT) 10 MG tablet Take 1 tablet (10 mg total) by mouth at bedtime. 90 tablet 1   losartan (COZAAR) 50 MG tablet 1 tab(s) orally once a day for 30 day(s) 90 tablet 2   metFORMIN (GLUCOPHAGE) 1000 MG tablet Take 1 tablet (1,000 mg total) by mouth daily with breakfast. 90 tablet 2   QUEtiapine (  SEROQUEL) 100 MG tablet Take 1 tablet (100 mg total) by mouth at bedtime. 30 tablet 4   No facility-administered medications prior to visit.    No Known Allergies  ROS Review of Systems  Constitutional: Negative.   HENT:  Positive for trouble swallowing. Negative for ear pain, postnasal drip, rhinorrhea, sinus pressure, sore throat and voice change.   Eyes: Negative.   Respiratory:  Negative for apnea, cough, choking, chest tightness, shortness of breath, wheezing and stridor.   Cardiovascular: Negative.  Negative for chest pain, palpitations and leg swelling.  Gastrointestinal: Negative.  Negative for abdominal distention, abdominal pain, nausea and vomiting.  Genitourinary: Negative.   Musculoskeletal: Negative.  Negative for arthralgias and myalgias.  Skin: Negative.  Negative for rash.  Allergic/Immunologic: Negative.  Negative for environmental allergies and food allergies.  Neurological:  Positive for weakness and light-headedness. Negative for dizziness, syncope and headaches.  Hematological: Negative.  Negative for adenopathy. Does not bruise/bleed easily.  Psychiatric/Behavioral:  Positive for sleep disturbance. Negative for agitation. The patient is not nervous/anxious.       Objective:    Physical Exam Vitals reviewed.  Constitutional:      Appearance: Normal appearance. He is well-developed. He is obese. He is not diaphoretic.     Comments: Somewhat drowsy  HENT:      Head: Normocephalic and atraumatic.     Nose: No nasal deformity, septal deviation, mucosal edema or rhinorrhea.     Right Sinus: No maxillary sinus tenderness or frontal sinus tenderness.     Left Sinus: No maxillary sinus tenderness or frontal sinus tenderness.     Mouth/Throat:     Pharynx: No oropharyngeal exudate.  Eyes:     General: No scleral icterus.    Conjunctiva/sclera: Conjunctivae normal.     Pupils: Pupils are equal, round, and reactive to light.  Neck:     Thyroid: No thyromegaly.     Vascular: No carotid bruit or JVD.     Trachea: Trachea normal. No tracheal tenderness or tracheal deviation.  Cardiovascular:     Rate and Rhythm: Normal rate and regular rhythm.     Chest Wall: PMI is not displaced.     Pulses: Normal pulses. No decreased pulses.     Heart sounds: Normal heart sounds, S1 normal and S2 normal. Heart sounds not distant. No murmur heard.    No systolic murmur is present.     No diastolic murmur is present.     No friction rub. No gallop. No S3 or S4 sounds.  Pulmonary:     Effort: Pulmonary effort is normal. No tachypnea, accessory muscle usage or respiratory distress.     Breath sounds: Normal breath sounds. No stridor. No decreased breath sounds, wheezing, rhonchi or rales.  Chest:     Chest wall: No tenderness.  Abdominal:     General: Bowel sounds are normal. There is no distension.     Palpations: Abdomen is soft. Abdomen is not rigid.     Tenderness: There is no abdominal tenderness. There is no guarding or rebound.  Musculoskeletal:        General: Normal range of motion.     Cervical back: Normal range of motion and neck supple. No edema, erythema or rigidity. No muscular tenderness. Normal range of motion.  Lymphadenopathy:     Head:     Right side of head: No submental or submandibular adenopathy.     Left side of head: No submental or submandibular adenopathy.  Cervical: No cervical adenopathy.  Skin:    General: Skin is warm and dry.      Coloration: Skin is not pale.     Findings: No rash.     Nails: There is no clubbing.  Neurological:     General: No focal deficit present.     Mental Status: Mental status is at baseline.     Sensory: No sensory deficit.     Motor: Weakness present.     Coordination: Coordination abnormal.     Gait: Gait abnormal.     Comments: At baseline no new changes  Psychiatric:        Speech: Speech normal.        Behavior: Behavior normal.     There were no vitals taken for this visit. Wt Readings from Last 3 Encounters:  12/06/22 172 lb (78 kg)  11/14/22 177 lb 6.4 oz (80.5 kg)  10/03/22 187 lb (84.8 kg)     Health Maintenance Due  Topic Date Due   COVID-19 Vaccine (4 - 2023-24 season) 05/05/2022   FOOT EXAM  08/15/2022    There are no preventive care reminders to display for this patient.  Lab Results  Component Value Date   TSH 0.178 (L) 02/15/2021   Lab Results  Component Value Date   WBC 5.3 03/28/2022   HGB 12.1 (L) 03/28/2022   HCT 36.3 (L) 03/28/2022   MCV 92 03/28/2022   PLT 219 03/28/2022   Lab Results  Component Value Date   NA 139 10/03/2022   K 4.8 10/03/2022   CO2 25 10/03/2022   GLUCOSE 86 10/03/2022   BUN 13 10/03/2022   CREATININE 1.00 10/03/2022   BILITOT 0.4 10/03/2022   ALKPHOS 71 10/03/2022   AST 16 10/03/2022   ALT 14 10/03/2022   PROT 7.1 10/03/2022   ALBUMIN 4.5 10/03/2022   CALCIUM 10.0 10/03/2022   ANIONGAP 8 02/17/2021   EGFR 81 10/03/2022   Lab Results  Component Value Date   CHOL 108 10/03/2022   Lab Results  Component Value Date   HDL 49 10/03/2022   Lab Results  Component Value Date   LDLCALC 47 10/03/2022   Lab Results  Component Value Date   TRIG 52 10/03/2022   Lab Results  Component Value Date   CHOLHDL 2.2 10/03/2022   Lab Results  Component Value Date   HGBA1C 6.5 (A) 10/03/2022      Assessment & Plan:   Problem List Items Addressed This Visit   None No orders of the defined types were  placed in this encounter.   Follow-up: No follow-ups on file.    Shan Levans, MD

## 2022-12-19 ENCOUNTER — Ambulatory Visit: Payer: Medicare Other | Attending: Critical Care Medicine | Admitting: Critical Care Medicine

## 2022-12-19 ENCOUNTER — Encounter: Payer: Self-pay | Admitting: Critical Care Medicine

## 2022-12-19 VITALS — BP 105/68 | HR 73

## 2022-12-19 DIAGNOSIS — E785 Hyperlipidemia, unspecified: Secondary | ICD-10-CM

## 2022-12-19 DIAGNOSIS — I69391 Dysphagia following cerebral infarction: Secondary | ICD-10-CM

## 2022-12-19 DIAGNOSIS — I10 Essential (primary) hypertension: Secondary | ICD-10-CM

## 2022-12-19 NOTE — Assessment & Plan Note (Signed)
Continue statin. 

## 2022-12-19 NOTE — Assessment & Plan Note (Signed)
Blood pressure improved no change in medication 

## 2022-12-19 NOTE — Assessment & Plan Note (Signed)
Stable at this time 

## 2022-12-19 NOTE — Patient Instructions (Signed)
No change REturn 3 months

## 2022-12-23 ENCOUNTER — Other Ambulatory Visit: Payer: Self-pay | Admitting: Critical Care Medicine

## 2022-12-23 DIAGNOSIS — R058 Other specified cough: Secondary | ICD-10-CM

## 2022-12-25 NOTE — Telephone Encounter (Signed)
Requested Prescriptions  Pending Prescriptions Disp Refills   cetirizine (ZYRTEC) 10 MG tablet [Pharmacy Med Name: Cetirizine HCl 10 MG Oral Tablet] 30 tablet 0    Sig: Take 1 tablet by mouth once daily     Ear, Nose, and Throat:  Antihistamines 2 Passed - 12/23/2022  6:52 AM      Passed - Cr in normal range and within 360 days    Creat  Date Value Ref Range Status  09/08/2013 0.96 0.50 - 1.35 mg/dL Final   Creatinine, Ser  Date Value Ref Range Status  10/03/2022 1.00 0.76 - 1.27 mg/dL Final         Passed - Valid encounter within last 12 months    Recent Outpatient Visits           6 days ago Benign essential HTN   [place] Storm Frisk, MD   1 month ago Benign essential HTN   [place] [facility] Storm Frisk, MD   2 months ago Controlled type 2 diabetes mellitus with hyperglycemia, without long-term current use of insulin [facility])   [place] [facility] Storm Frisk, MD   4 months ago Type 2 diabetes mellitus with other circulatory complication, without long-term current use of insulin [facility])   [place] [facility] [place], Montour L, RPH-CPP   5 months ago Type 2 diabetes mellitus with other circulatory complication, without long-term current use of insulin [facility])   [place] [facility] Marcine Matar, MD       Future Appointments             In 3 months [place] Charlcie Cradle, MD [facility]

## 2023-01-09 ENCOUNTER — Ambulatory Visit: Payer: Medicare Other | Admitting: Critical Care Medicine

## 2023-02-17 ENCOUNTER — Other Ambulatory Visit: Payer: Self-pay | Admitting: Critical Care Medicine

## 2023-02-19 NOTE — Telephone Encounter (Signed)
Requested medication (s) are due for refill today: Yes  Requested medication (s) are on the active medication list: Yes  Last refill:  10/03/22  Future visit scheduled: Yes  Notes to clinic:  Not delegated.    Requested Prescriptions  Pending Prescriptions Disp Refills   QUEtiapine (SEROQUEL) 100 MG tablet [Pharmacy Med Name: QUEtiapine Fumarate 100 MG Oral Tablet] 30 tablet 0    Sig: TAKE 1 TABLET BY MOUTH AT BEDTIME     Not Delegated - Psychiatry:  Antipsychotics - Second Generation (Atypical) - quetiapine Failed - 02/17/2023  6:52 AM      Failed - This refill cannot be delegated      Failed - TSH in normal range and within 360 days    TSH  Date Value Ref Range Status  02/15/2021 0.178 (L) 0.450 - 4.500 uIU/mL Final         Failed - Lipid Panel in normal range within the last 12 months    Cholesterol, Total  Date Value Ref Range Status  10/03/2022 108 100 - 199 mg/dL Final   LDL Chol Calc (NIH)  Date Value Ref Range Status  10/03/2022 47 0 - 99 mg/dL Final   HDL  Date Value Ref Range Status  10/03/2022 49 >39 mg/dL Final   Triglycerides  Date Value Ref Range Status  10/03/2022 52 0 - 149 mg/dL Final         Passed - Last BP in normal range    BP Readings from Last 1 Encounters:  12/19/22 105/68         Passed - Last Heart Rate in normal range    Pulse Readings from Last 1 Encounters:  12/19/22 73         Passed - Valid encounter within last 6 months    Recent Outpatient Visits           2 months ago Benign essential HTN   [place] Storm Frisk, MD   3 months ago Benign essential HTN   [place] [facility] Storm Frisk, MD   4 months ago Controlled type 2 diabetes mellitus with hyperglycemia, without long-term current use of insulin [facility])   [place] [facility] Storm Frisk, MD   6 months ago Type 2 diabetes mellitus with other circulatory  complication, without long-term current use of insulin [facility])   [place] [facility] [place], Havelock L, RPH-CPP   7 months ago Type 2 diabetes mellitus with other circulatory complication, without long-term current use of insulin [facility])   [place] Marcine Matar, MD       Future Appointments             In 1 month Storm Frisk, MD [facility]            Passed - CBC within normal limits and completed in the last 12 months    WBC  Date Value Ref Range Status  03/28/2022 5.3 3.4 - 10.8 x10E3/uL Final  02/17/2021 5.3 4.0 - 10.5 K/uL Final   RBC  Date Value Ref Range Status  03/28/2022 3.95 (L) 4.14 - 5.80 x10E6/uL Final  02/17/2021 4.03 (L) 4.22 - 5.81 MIL/uL Final   Hemoglobin  Date Value Ref Range Status  03/28/2022 12.1 (L) 13.0 - 17.7 g/dL Final   Hematocrit  Date Value Ref Range  Status  03/28/2022 36.3 (L) 37.5 - 51.0 % Final   MCHC  Date Value Ref Range Status  03/28/2022 33.3 31.5 - 35.7 g/dL Final  16/06/9603 54.0 30.0 - 36.0 g/dL Final   [facility]  Date Value Ref Range Status  03/28/2022 30.6 26.6 - 33.0 pg Final  02/17/2021 30.5 26.0 - 34.0 pg Final   MCV  Date Value Ref Range Status  03/28/2022 92 79 - 97 fL Final   No results found for: "PLTCOUNTKUC", "LABPLAT", "POCPLA" RDW  Date Value Ref Range Status  03/28/2022 12.0 11.6 - 15.4 % Final         Passed - CMP within normal limits and completed in the last 12 months    Albumin  Date Value Ref Range Status  10/03/2022 4.5 3.9 - 4.9 g/dL Final   Alkaline Phosphatase  Date Value Ref Range Status  10/03/2022 71 44 - 121 IU/L Final   ALT  Date Value Ref Range Status  10/03/2022 14 0 - 44 IU/L Final   AST  Date Value Ref Range Status  10/03/2022 16 0 - 40 IU/L Final   BUN  Date Value Ref Range Status  10/03/2022 13 8 - 27 mg/dL Final   Calcium  Date Value Ref Range Status   10/03/2022 10.0 8.6 - 10.2 mg/dL Final   Calcium, Ion  Date Value Ref Range Status  03/10/2019 1.06 (L) 1.15 - 1.40 mmol/L Final   CO2  Date Value Ref Range Status  10/03/2022 25 20 - 29 mmol/L Final   TCO2  Date Value Ref Range Status  03/10/2019 26 22 - 32 mmol/L Final   Creat  Date Value Ref Range Status  09/08/2013 0.96 0.50 - 1.35 mg/dL Final   Creatinine, Ser  Date Value Ref Range Status  10/03/2022 1.00 0.76 - 1.27 mg/dL Final   Glucose  Date Value Ref Range Status  10/03/2022 86 70 - 99 mg/dL Final   Glucose, Bld  Date Value Ref Range Status  02/17/2021 130 (H) 70 - 99 mg/dL Final    Comment:    Glucose reference range applies only to samples taken after fasting for at least 8 hours.   POC Glucose  Date Value Ref Range Status  10/03/2022 112 (A) 70 - 99 mg/dl Final   Glucose-Capillary  Date Value Ref Range Status  02/17/2021 117 (H) 70 - 99 mg/dL Final    Comment:    Glucose reference range applies only to samples taken after fasting for at least 8 hours.   Potassium  Date Value Ref Range Status  10/03/2022 4.8 3.5 - 5.2 mmol/L Final   Sodium  Date Value Ref Range Status  10/03/2022 139 134 - 144 mmol/L Final   Bilirubin Total  Date Value Ref Range Status  10/03/2022 0.4 0.0 - 1.2 mg/dL Final   Bilirubin, Direct  Date Value Ref Range Status  04/02/2019 0.2 0.0 - 0.2 mg/dL Final   Indirect Bilirubin  Date Value Ref Range Status  04/02/2019 1.1 (H) 0.3 - 0.9 mg/dL Final    Comment:    Performed at [facility], [address] [place], [place] [address]   Protein, ur  Date Value Ref Range Status  11/02/2022 30 (A) NEGATIVE mg/dL Final   Total Protein  Date Value Ref Range Status  10/03/2022 7.1 6.0 - 8.5 g/dL Final   GFR, Est African American  Date Value Ref Range Status  09/08/2013 >89 mL/min Final   GFR calc Af Denyse Dago  Date Value Ref Range Status  06/07/2020 >60 >60 mL/min Final   eGFR  Date Value Ref Range Status   10/03/2022 81 >59 mL/min/1.73 Final   GFR, Est Non African American  Date Value Ref Range Status  09/08/2013 86 mL/min Final    Comment:      The estimated GFR is a calculation valid for adults (>=48 years old) that uses the CKD-EPI algorithm to adjust for age and sex. It is   not to be used for children, pregnant women, hospitalized patients,    patients on dialysis, or with rapidly changing kidney function. According to the NKDEP, eGFR >89 is normal, 60-89 shows mild impairment, 30-59 shows moderate impairment, 15-29 shows severe impairment and <15 is ESRD.     GFR, Estimated  Date Value Ref Range Status  02/17/2021 >60 >60 mL/min Final    Comment:    (NOTE) Calculated using the CKD-EPI Creatinine Equation (2021)

## 2023-03-13 ENCOUNTER — Telehealth: Payer: Self-pay | Admitting: Critical Care Medicine

## 2023-03-13 MED ORDER — TRAZODONE HCL 50 MG PO TABS: 25.0000 mg | ORAL_TABLET | Freq: Every evening | ORAL | 3 refills | Status: DC | PRN

## 2023-03-13 NOTE — Telephone Encounter (Signed)
Call received from patient's spouse the patient's not sleeping at night would benefit from sleeping medication will prescribe low-dose trazodone and see how the patient responds

## 2023-03-21 ENCOUNTER — Other Ambulatory Visit: Payer: Self-pay | Admitting: Critical Care Medicine

## 2023-03-21 DIAGNOSIS — R058 Other specified cough: Secondary | ICD-10-CM

## 2023-03-21 NOTE — Telephone Encounter (Signed)
Requested Prescriptions  Pending Prescriptions Disp Refills   cetirizine (ZYRTEC) 10 MG tablet [Pharmacy Med Name: Cetirizine HCl 10 MG Oral Tablet] 90 tablet 0    Sig: Take 1 tablet by mouth once daily     Ear, Nose, and Throat:  Antihistamines 2 Passed - 03/21/2023  6:51 AM      Passed - Cr in normal range and within 360 days    Creat  Date Value Ref Range Status  09/08/2013 0.96 0.50 - 1.35 mg/dL Final   Creatinine, Ser  Date Value Ref Range Status  10/03/2022 1.00 0.76 - 1.27 mg/dL Final         Passed - Valid encounter within last 12 months    Recent Outpatient Visits           3 months ago Benign essential HTN   [place] [facility] Storm Frisk, MD   4 months ago Benign essential HTN   [facility] Storm Frisk, MD   5 months ago Controlled type 2 diabetes mellitus with hyperglycemia, without long-term current use of insulin [facility])   [facility] Storm Frisk, MD   7 months ago Type 2 diabetes mellitus with other circulatory complication, without long-term current use of insulin [facility])   [place] [facility] [place], Burke L, RPH-CPP   8 months ago Type 2 diabetes mellitus with other circulatory complication, without long-term current use of insulin [facility])   [facility] Marcine Matar, MD       Future Appointments             In 6 days Storm Frisk, MD [facility]

## 2023-03-26 NOTE — Progress Notes (Unsigned)
Established Patient Office Visit  Subjective:  Patient ID: Joe Stineman., male    DOB: 12/31/1950  Age: 72 y.o. MRN: [id number]  Chief Complaint  Patient presents with   Diabetes    HPI 11/2021 Joe BOURCIER Sr. presents for hypertension and follow-up.  Patient is accompanied by his spouse.  On arrival blood pressure remains elevated 147/88.  On recheck it is in the same range.  He has history of dysphagia due to recent stroke.  He still coughs when he eats.  Patient maintains low-dose amlodipine.  Patient does main taint atorvastatin and aspirin.  He is now off the Plavix.  Patient also maintaining Aricept for chronic memory loss.  He is fully vaccinated.  On arrival A1c is 6.8.  He maintains the metformin.  There are no other complaints at this time.  5/17 Patient returns in follow-up continues with chronic cough off-and-on related to aspiration and dysphagia.  The patient had 1 coughing paroxysm and developed left conjunctival bleed.  Patient was seen in urgent care May 7 and was just given conservative observation.  Since that time the bleeding has resolved.  Patient has no other complaints at this time.  The wife is pondering may be taking him back to [place] to see his children as they are not able to travel to the [place] due to visa restrictions at this time.  Patient also is not sleeping well at night he is taking the 50 mg of Seroquel only at bedtime  Note at the last visit A1c was at goal and blood pressure today is good 133/85  7/25 Patient seen in return follow-up he is having a bit more coughing with eating but he tends to eat in larger amounts.  The wife would like for him to go back to physical therapy to get him stronger.  On arrival blood pressure is stable 135/81  Patient does have some edema in the lower extremities.  Patient is compliant with amlodipine 10 mg daily and takes aspirin 81 mg daily after stroke.  Patient does maintain the atorvastatin  daily.  Metformin is maintained twice daily.  Patient is sleeping better on Seroquel.  A1c at this visit at goal 7.1   10/03/22 Patient seen in return follow-up overall doing well with no difficulty A1c is at goal at this visit.  Blood pressure on arrival 122/79. The patient has had no falls.  He has had no further evidence of stroke.  Blood glucose has been controlled at home.  He needs follow-up labs at this visit.   11/14/22 Patient seen in return follow-up and has developed some weakness decreased oral intake he had low urine output is now improved.  He had low potassium and was seen in the emergency room recently.  Patient's blood pressure on arrival is low.  He has had no falls.  12/19/22 This is a quick follow-up to determine if blood pressure remains low.  The patient's cough is unchanged.  Blood pressure is  higher at home.  On arrival blood pressure is 105/68 and is improved.  Patient maintains amlodipine 5 mg daily and losartan 50 mg daily. There are no other new complaints  7/23 This patient is seen in return follow-up accompanied by his wife.  There are no new complaints.  Blood pressure is excellent on arrival.  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)    Diabetes mellitus without complication ([name])    Dysphagia    post stroke   Hypertension    Left upper lobe pneumonia 02/15/2021   Stroke [facility])     Past Surgical History:  Procedure Laterality Date   IR ANGIO INTRA EXTRACRAN SEL COM CAROTID INNOMINATE UNI L MOD SED  03/10/2019   IR ANGIO VERTEBRAL SEL SUBCLAVIAN INNOMINATE BILAT MOD SED  03/10/2019   IR CT HEAD LTD  03/10/2019   IR PERCUTANEOUS ART THROMBECTOMY/INFUSION INTRACRANIAL INC DIAG ANGIO  03/10/2019   RADIOLOGY WITH ANESTHESIA N/A  03/10/2019   Procedure: IR WITH ANESTHESIA/CODE STROKE;  Surgeon: Radiologist, Medication, MD;  Location: [facility] OR;  Service: Radiology;  Laterality: N/A;    Family History  Problem Relation Age of Onset   Diabetes Other    Hypertension Other    Healthy Mother    Healthy Father     Social History   Socioeconomic History   Marital status: Married    Spouse name: [name]   Number of children: Not on file   Years of education: Not on file   Highest education level: Not on file  Occupational History   Not on file  Tobacco Use   Smoking status: Never   Smokeless tobacco: Never  Vaping Use   Vaping status: Never Used  Substance and Sexual Activity   Alcohol use: No   Drug use: No   Sexual activity: Not Currently  Other Topics Concern   Not on file  Social History Narrative   12/16/19 Lives with wife   Social Determinants of Health   Financial Resource Strain: Low Risk  (12/06/2022)   Overall Financial Resource Strain (CARDIA)    Difficulty of Paying Living Expenses: Not hard at all  Food Insecurity: No Food Insecurity (12/06/2022)   Hunger Vital Sign    Worried About Running Out of Food in the Last Year: Never true    Ran Out of Food in the Last Year: Never true  Transportation Needs: No Transportation Needs (12/06/2022)   PRAPARE - Administrator, Civil Service (Medical): No    Lack of Transportation (Non-Medical): No  Physical Activity: Inactive (12/06/2022)   Exercise Vital Sign    Days of Exercise per Week: 0 days    Minutes of Exercise per Session: 0 min  Stress: No Stress Concern Present (12/06/2022)   [employer] of Occupational Health - Occupational Stress Questionnaire    Feeling of Stress : Not at all  Social Connections: Not on file  Intimate Partner Violence: Not on file    Outpatient Medications Prior to Visit  Medication Sig Dispense Refill   acetaminophen (TYLENOL) 325 MG tablet Take 2 tablets (650 mg total) by mouth every 4 (four) hours as needed  for mild pain (or temp > 37.5 C (99.5 F)).     amLODipine (NORVASC) 5 MG tablet Take 1 tablet (5 mg total) by mouth daily. To lower blood pressure 90 tablet 2   aspirin EC 81 MG tablet Take 1 tablet (81 mg total) by mouth daily. 100 tablet 3   atorvastatin (LIPITOR) 40 MG tablet TAKE 1 TABLET BY MOUTH IN THE EVENING TO LOWER CHOLESTEROL. 90 tablet 2   azelastine (ASTELIN) 0.1 % nasal spray Place 2 sprays into both nostrils 2 (two) times daily. Use in each nostril as directed 30 mL 12   cetirizine (ZYRTEC) 10 MG tablet Take 1  tablet by mouth once daily 90 tablet 0   chlorthalidone (HYGROTEN) 15 MG tablet Take 1 tablet (15 mg total) by mouth daily. 90 tablet 2   donepezil (ARICEPT) 10 MG tablet Take 1 tablet (10 mg total) by mouth at bedtime. 90 tablet 1   losartan (COZAAR) 50 MG tablet 1 tab(s) orally once a day for 30 day(s) 90 tablet 2   QUEtiapine (SEROQUEL) 100 MG tablet TAKE 1 TABLET BY MOUTH AT BEDTIME 30 tablet 4   traZODone (DESYREL) 50 MG tablet Take 0.5-1 tablets (25-50 mg total) by mouth at bedtime as needed for sleep. 30 tablet 3   metFORMIN (GLUCOPHAGE) 1000 MG tablet Take 1 tablet (1,000 mg total) by mouth daily with breakfast. 90 tablet 2   No facility-administered medications prior to visit.    No Known Allergies  ROS Review of Systems  Constitutional: Negative.   HENT:  Positive for trouble swallowing. Negative for ear pain, postnasal drip, rhinorrhea, sinus pressure, sore throat and voice change.   Eyes: Negative.   Respiratory:  Positive for cough. Negative for apnea, choking, chest tightness, shortness of breath, wheezing and stridor.   Cardiovascular: Negative.  Negative for chest pain, palpitations and leg swelling.  Gastrointestinal: Negative.  Negative for abdominal distention, abdominal pain, nausea and vomiting.  Genitourinary: Negative.   Musculoskeletal: Negative.  Negative for arthralgias and myalgias.  Skin: Negative.  Negative for rash.   Allergic/Immunologic: Negative.  Negative for environmental allergies and food allergies.  Neurological:  Negative for dizziness, syncope, weakness, light-headedness and headaches.  Hematological: Negative.  Negative for adenopathy. Does not bruise/bleed easily.  Psychiatric/Behavioral:  Positive for sleep disturbance. Negative for agitation. The patient is not nervous/anxious.       Objective:    Physical Exam Vitals reviewed.  Constitutional:      Appearance: Normal appearance. He is well-developed. He is obese. He is not diaphoretic.     Comments: Somewhat drowsy  HENT:     Head: Normocephalic and atraumatic.     Nose: No nasal deformity, septal deviation, mucosal edema or rhinorrhea.     Right Sinus: No maxillary sinus tenderness or frontal sinus tenderness.     Left Sinus: No maxillary sinus tenderness or frontal sinus tenderness.     Mouth/Throat:     Pharynx: No oropharyngeal exudate.  Eyes:     General: No scleral icterus.    Conjunctiva/sclera: Conjunctivae normal.     Pupils: Pupils are equal, round, and reactive to light.  Neck:     Thyroid: No thyromegaly.     Vascular: No carotid bruit or JVD.     Trachea: Trachea normal. No tracheal tenderness or tracheal deviation.  Cardiovascular:     Rate and Rhythm: Normal rate and regular rhythm.     Chest Wall: PMI is not displaced.     Pulses: Normal pulses. No decreased pulses.     Heart sounds: Normal heart sounds, S1 normal and S2 normal. Heart sounds not distant. No murmur heard.    No systolic murmur is present.     No diastolic murmur is present.     No friction rub. No gallop. No S3 or S4 sounds.  Pulmonary:     Effort: Pulmonary effort is normal. No tachypnea, accessory muscle usage or respiratory distress.     Breath sounds: Normal breath sounds. No stridor. No decreased breath sounds, wheezing, rhonchi or rales.  Chest:     Chest wall: No tenderness.  Abdominal:     General: Bowel sounds  are normal. There is  no distension.     Palpations: Abdomen is soft. Abdomen is not rigid.     Tenderness: There is no abdominal tenderness. There is no guarding or rebound.  Musculoskeletal:        General: Normal range of motion.     Cervical back: Normal range of motion and neck supple. No edema, erythema or rigidity. No muscular tenderness. Normal range of motion.     Comments: Foot exam normal  Lymphadenopathy:     Head:     Right side of head: No submental or submandibular adenopathy.     Left side of head: No submental or submandibular adenopathy.     Cervical: No cervical adenopathy.  Skin:    General: Skin is warm and dry.     Coloration: Skin is not pale.     Findings: No rash.     Nails: There is no clubbing.  Neurological:     General: No focal deficit present.     Mental Status: Mental status is at baseline.     Sensory: No sensory deficit.     Motor: Weakness present.     Coordination: Coordination abnormal.     Gait: Gait abnormal.     Comments: At baseline no new changes  Psychiatric:        Speech: Speech normal.        Behavior: Behavior normal.     BP 123/74 (BP Location: Right Arm, Patient Position: Sitting, Cuff Size: Normal)   Pulse 95   Wt 177 lb 3.2 oz (80.4 kg)   SpO2 98%   BMI 21.57 kg/m  Wt Readings from Last 3 Encounters:  03/27/23 177 lb 3.2 oz (80.4 kg)  12/06/22 172 lb (78 kg)  11/14/22 177 lb 6.4 oz (80.5 kg)     Health Maintenance Due  Topic Date Due   COVID-19 Vaccine (4 - 2023-24 season) 05/05/2022    There are no preventive care reminders to display for this patient.  Lab Results  Component Value Date   TSH 0.178 (L) 02/15/2021   Lab Results  Component Value Date   WBC 5.3 03/28/2022   HGB 12.1 (L) 03/28/2022   HCT 36.3 (L) 03/28/2022   MCV 92 03/28/2022   PLT 219 03/28/2022   Lab Results  Component Value Date   NA 139 10/03/2022   K 4.8 10/03/2022   CO2 25 10/03/2022   GLUCOSE 86 10/03/2022   BUN 13 10/03/2022   CREATININE 1.00  10/03/2022   BILITOT 0.4 10/03/2022   ALKPHOS 71 10/03/2022   AST 16 10/03/2022   ALT 14 10/03/2022   PROT 7.1 10/03/2022   ALBUMIN 4.5 10/03/2022   CALCIUM 10.0 10/03/2022   ANIONGAP 8 02/17/2021   EGFR 81 10/03/2022   Lab Results  Component Value Date   CHOL 108 10/03/2022   Lab Results  Component Value Date   HDL 49 10/03/2022   Lab Results  Component Value Date   LDLCALC 47 10/03/2022   Lab Results  Component Value Date   TRIG 52 10/03/2022   Lab Results  Component Value Date   CHOLHDL 2.2 10/03/2022   Lab Results  Component Value Date   HGBA1C 6.3 03/27/2023      Assessment & Plan:   Problem List Items Addressed This Visit       Cardiovascular and Mediastinum   Dysphagia due to recent stroke (Chronic)    Monitor      Benign essential HTN - Primary (  Chronic)    At goal no change        Endocrine   Controlled type 2 diabetes mellitus with hyperglycemia, without long-term current use of insulin ([name]) (Chronic)    Continue current medication with metformin daily      Relevant Orders   POCT glucose (manual entry) (Completed)   POCT glycosylated hemoglobin (Hb A1C) (Completed)   BMP8+eGFR     Other   Dyslipidemia, goal LDL below 70    Continue statin      No orders of the defined types were placed in this encounter.   Follow-up: Return in about 3 months (around 06/27/2023) for htn, primary care follow up, diabetes.    Shan Levans, MD

## 2023-03-27 ENCOUNTER — Encounter: Payer: Self-pay | Admitting: Critical Care Medicine

## 2023-03-27 ENCOUNTER — Ambulatory Visit: Payer: Medicare Other | Attending: Critical Care Medicine | Admitting: Critical Care Medicine

## 2023-03-27 VITALS — BP 123/74 | HR 95 | Wt 177.2 lb

## 2023-03-27 DIAGNOSIS — I69391 Dysphagia following cerebral infarction: Secondary | ICD-10-CM

## 2023-03-27 DIAGNOSIS — I10 Essential (primary) hypertension: Secondary | ICD-10-CM

## 2023-03-27 DIAGNOSIS — E785 Hyperlipidemia, unspecified: Secondary | ICD-10-CM

## 2023-03-27 DIAGNOSIS — Z7984 Long term (current) use of oral hypoglycemic drugs: Secondary | ICD-10-CM

## 2023-03-27 DIAGNOSIS — E119 Type 2 diabetes mellitus without complications: Secondary | ICD-10-CM

## 2023-03-27 DIAGNOSIS — E1165 Type 2 diabetes mellitus with hyperglycemia: Secondary | ICD-10-CM | POA: Diagnosis not present

## 2023-03-27 LAB — POCT GLYCOSYLATED HEMOGLOBIN (HGB A1C): HbA1c, POC (controlled diabetic range): 6.3 % (ref 0.0–7.0)

## 2023-03-27 LAB — GLUCOSE, POCT (MANUAL RESULT ENTRY): POC Glucose: 125 mg/dl — AB (ref 70–99)

## 2023-03-27 NOTE — Assessment & Plan Note (Signed)
At goal no change 

## 2023-03-27 NOTE — Assessment & Plan Note (Signed)
Continue current medication with metformin daily

## 2023-03-27 NOTE — Patient Instructions (Signed)
No change in medications Labs metabolic panel Return 3 months

## 2023-03-27 NOTE — Assessment & Plan Note (Signed)
Continue statin. 

## 2023-03-27 NOTE — Assessment & Plan Note (Signed)
Monitor

## 2023-03-28 LAB — BMP8+EGFR
BUN/Creatinine Ratio: 21 (ref 10–24)
BUN: 22 mg/dL (ref 8–27)
CO2: 30 mmol/L — ABNORMAL HIGH (ref 20–29)
Calcium: 9.7 mg/dL (ref 8.6–10.2)
Chloride: 100 mmol/L (ref 96–106)
Creatinine, Ser: 1.05 mg/dL (ref 0.76–1.27)
Glucose: 105 mg/dL — ABNORMAL HIGH (ref 70–99)
Potassium: 4.3 mmol/L (ref 3.5–5.2)
Sodium: 140 mmol/L (ref 134–144)
eGFR: 77 mL/min/{1.73_m2} (ref >59)

## 2023-03-28 NOTE — Progress Notes (Signed)
Call spouse let her know mr Werden labs normal

## 2023-03-29 ENCOUNTER — Telehealth: Payer: Self-pay

## 2023-03-29 NOTE — Telephone Encounter (Signed)
-----   Message from Shan Levans sent at 03/28/2023  5:50 AM EDT ----- Call spouse let her know mr [facility] labs normal

## 2023-03-29 NOTE — Telephone Encounter (Signed)
Pt wife ( who is on dpr)was called and is aware of results, DOB was confirmed.

## 2023-04-18 ENCOUNTER — Other Ambulatory Visit: Payer: Self-pay | Admitting: Critical Care Medicine

## 2023-04-18 DIAGNOSIS — I69319 Unspecified symptoms and signs involving cognitive functions following cerebral infarction: Secondary | ICD-10-CM

## 2023-05-18 ENCOUNTER — Other Ambulatory Visit: Payer: Self-pay | Admitting: Critical Care Medicine

## 2023-05-18 DIAGNOSIS — E1159 Type 2 diabetes mellitus with other circulatory complications: Secondary | ICD-10-CM

## 2023-06-10 ENCOUNTER — Other Ambulatory Visit: Payer: Self-pay | Admitting: Critical Care Medicine

## 2023-06-11 NOTE — Telephone Encounter (Signed)
Requested Prescriptions  Pending Prescriptions Disp Refills   traZODone (DESYREL) 50 MG tablet [Pharmacy Med Name: traZODone HCl 50 MG Oral Tablet] 30 tablet 0    Sig: TAKE 1/2 TO 1 (ONE-HALF TO ONE) TABLET BY MOUTH AT BEDTIME AS NEEDED FOR SLEEP     Psychiatry: Antidepressants - Serotonin Modulator Passed - 06/10/2023  6:29 PM      Passed - Valid encounter within last 6 months    Recent Outpatient Visits           2 months ago Benign essential HTN   [place] [facility] Storm Frisk, MD   5 months ago Benign essential HTN   [place] [facility] Storm Frisk, MD   6 months ago Benign essential HTN   [place] [facility] Storm Frisk, MD   8 months ago Controlled type 2 diabetes mellitus with hyperglycemia, without long-term current use of insulin [facility])   [place] [facility] Storm Frisk, MD   10 months ago Type 2 diabetes mellitus with other circulatory complication, without long-term current use of insulin [facility])   [place] [facility], Cornelius Moras, RPH-CPP       Future Appointments             In 3 weeks Hoy Register, MD [facility]

## 2023-06-17 ENCOUNTER — Other Ambulatory Visit: Payer: Self-pay | Admitting: Critical Care Medicine

## 2023-06-17 DIAGNOSIS — R058 Other specified cough: Secondary | ICD-10-CM

## 2023-07-01 ENCOUNTER — Other Ambulatory Visit: Payer: Self-pay | Admitting: Internal Medicine

## 2023-07-01 DIAGNOSIS — E1159 Type 2 diabetes mellitus with other circulatory complications: Secondary | ICD-10-CM

## 2023-07-02 ENCOUNTER — Other Ambulatory Visit: Payer: Self-pay | Admitting: Internal Medicine

## 2023-07-02 DIAGNOSIS — E1159 Type 2 diabetes mellitus with other circulatory complications: Secondary | ICD-10-CM

## 2023-07-03 ENCOUNTER — Encounter: Payer: Self-pay | Admitting: Family Medicine

## 2023-07-03 ENCOUNTER — Ambulatory Visit: Payer: Medicare Other | Attending: Family Medicine | Admitting: Family Medicine

## 2023-07-03 VITALS — BP 129/82 | HR 76 | Ht 76.0 in | Wt 184.4 lb

## 2023-07-03 DIAGNOSIS — E1159 Type 2 diabetes mellitus with other circulatory complications: Secondary | ICD-10-CM | POA: Diagnosis not present

## 2023-07-03 DIAGNOSIS — E785 Hyperlipidemia, unspecified: Secondary | ICD-10-CM

## 2023-07-03 DIAGNOSIS — Z7984 Long term (current) use of oral hypoglycemic drugs: Secondary | ICD-10-CM

## 2023-07-03 DIAGNOSIS — I152 Hypertension secondary to endocrine disorders: Secondary | ICD-10-CM

## 2023-07-03 DIAGNOSIS — I10 Essential (primary) hypertension: Secondary | ICD-10-CM

## 2023-07-03 DIAGNOSIS — E1165 Type 2 diabetes mellitus with hyperglycemia: Secondary | ICD-10-CM

## 2023-07-03 DIAGNOSIS — I6930 Unspecified sequelae of cerebral infarction: Secondary | ICD-10-CM

## 2023-07-03 DIAGNOSIS — Z23 Encounter for immunization: Secondary | ICD-10-CM | POA: Diagnosis not present

## 2023-07-03 DIAGNOSIS — I69319 Unspecified symptoms and signs involving cognitive functions following cerebral infarction: Secondary | ICD-10-CM

## 2023-07-03 DIAGNOSIS — R058 Other specified cough: Secondary | ICD-10-CM

## 2023-07-03 LAB — POCT GLYCOSYLATED HEMOGLOBIN (HGB A1C): HbA1c, POC (controlled diabetic range): 7.2 % — AB (ref 0.0–7.0)

## 2023-07-03 MED ORDER — METFORMIN HCL 500 MG PO TABS: 1000.0000 mg | ORAL_TABLET | Freq: Two times a day (BID) | ORAL | 1 refills | Status: DC

## 2023-07-03 MED ORDER — CETIRIZINE HCL 10 MG PO TABS
10.0000 mg | ORAL_TABLET | Freq: Every day | ORAL | 1 refills | Status: DC
Start: 2023-07-03 — End: 2024-03-04

## 2023-07-03 MED ORDER — DONEPEZIL HCL 10 MG PO TABS: 10.0000 mg | ORAL_TABLET | Freq: Every day | ORAL | 1 refills | Status: DC

## 2023-07-03 MED ORDER — MISC. DEVICES MISC: 0 refills | Status: AC

## 2023-07-03 MED ORDER — LOSARTAN POTASSIUM 50 MG PO TABS: ORAL_TABLET | ORAL | 1 refills | Status: DC

## 2023-07-03 MED ORDER — AMLODIPINE BESYLATE 5 MG PO TABS
5.0000 mg | ORAL_TABLET | Freq: Every day | ORAL | 1 refills | Status: DC
Start: 2023-07-03 — End: 2024-01-16

## 2023-07-03 MED ORDER — TRAZODONE HCL 50 MG PO TABS: 50.0000 mg | ORAL_TABLET | Freq: Every day | ORAL | 1 refills | Status: DC

## 2023-07-03 MED ORDER — CHLORTHALIDONE 25 MG PO TABS
12.5000 mg | ORAL_TABLET | Freq: Every day | ORAL | 1 refills | Status: DC
Start: 2023-07-03 — End: 2023-12-07

## 2023-07-03 MED ORDER — QUETIAPINE FUMARATE 100 MG PO TABS: 100.0000 mg | ORAL_TABLET | Freq: Every day | ORAL | 1 refills | Status: DC

## 2023-07-03 MED ORDER — ATORVASTATIN CALCIUM 40 MG PO TABS
ORAL_TABLET | ORAL | 1 refills | Status: DC
Start: 2023-07-03 — End: 2024-01-16

## 2023-07-03 NOTE — Telephone Encounter (Signed)
Unable to refill per protocol, Rx expired. Discontinued 10/03/22.  Requested Prescriptions  Pending Prescriptions Disp Refills   chlorthalidone (HYGROTON) 25 MG tablet [Pharmacy Med Name: Chlorthalidone 25 MG Oral Tablet] 90 tablet 0    Sig: Take 1 tablet by mouth once daily     Cardiovascular: Diuretics - Thiazide Passed - 07/02/2023  4:12 PM      Passed - Cr in normal range and within 180 days    Creat  Date Value Ref Range Status  09/08/2013 0.96 0.50 - 1.35 mg/dL Final   Creatinine, Ser  Date Value Ref Range Status  03/27/2023 1.05 0.76 - 1.27 mg/dL Final         Passed - K in normal range and within 180 days    Potassium  Date Value Ref Range Status  03/27/2023 4.3 3.5 - 5.2 mmol/L Final         Passed - Na in normal range and within 180 days    Sodium  Date Value Ref Range Status  03/27/2023 140 134 - 144 mmol/L Final         Passed - Last BP in normal range    BP Readings from Last 1 Encounters:  07/03/23 129/82         Passed - Valid encounter within last 6 months    Recent Outpatient Visits           Today Controlled type 2 diabetes mellitus with hyperglycemia, without long-term current use of insulin ([name])   [place] [facility] Hoy Register, MD   3 months ago Benign essential HTN   [place] [facility] Storm Frisk, MD   6 months ago Benign essential HTN   [place] [facility] Storm Frisk, MD   7 months ago Benign essential HTN   [place] [facility] Storm Frisk, MD   9 months ago Controlled type 2 diabetes mellitus with hyperglycemia, without long-term current use of insulin [facility])    [facility] Storm Frisk, MD       Future Appointments             In 6 months Hoy Register, MD [facility]

## 2023-07-03 NOTE — Progress Notes (Signed)
Subjective:  Patient ID: Joe Gully Sr., male    DOB: 14-Mar-1951  Age: 72 y.o. MRN: [id number]  CC: Medical Management of Chronic Issues (coughing)   HPI Joe QUEBEDEAUX Sr. is a 72 y.o. year old male with a history of type 2 diabetes mellitus, hypertension, hyperlipidemia, insomnia, CVA with residual hemiparesis. He presents today to establish care as he was previously followed by Dr. Delford Field who has retired.  Interval History: Discussed the use of AI scribe software for clinical note transcription with the patient, who gave verbal consent to proceed.  He presents with a persistent cough that has been ongoing since 2020. The cough is described as intermittent and is associated with a sensation of 'something running down the throat'. The patient denies any production of phlegm or mucus.  Cough is not associated with his meals or with drinking but spouse states he is reluctant to drink water. Despite encouragement from his spouse and previous medical advice to increase fluid intake, the patient only consumes two bottles of water per day, often under duress. The patient's spouse has attempted various strategies to increase fluid intake, including the use of a thickener. The patient's spouse also enforces a rule of sitting for at least 30 minutes after eating to prevent lying down immediately after meals.  He has had an increase in HbA1c from 6.3 previously to 7.2 since the last check in July. The patient's spouse denies any significant changes in diet, particularly in starch intake.  He remains adherent with metformin. He has been on his statin and an aspirin for secondary stroke prevention.  He continues to have weakness on both sides which is more pronounced on his left. Doing well on his antihypertensives. He engages minimally but most of the history is provided by his spouse.       Past Medical History:  Diagnosis Date   Acute metabolic encephalopathy 02/15/2021   Cerebrovascular  accident (CVA) due to occlusion of right posterior communicating artery ([name]) 06/03/2019   Cerebrovascular accident (CVA) due to occlusion of vertebral artery ([name])    Diabetes mellitus without complication ([name])    Dysphagia    post stroke   Hypertension    Left upper lobe pneumonia 02/15/2021   Stroke [facility])     Past Surgical History:  Procedure Laterality Date   IR ANGIO INTRA EXTRACRAN SEL COM CAROTID INNOMINATE UNI L MOD SED  03/10/2019   IR ANGIO VERTEBRAL SEL SUBCLAVIAN INNOMINATE BILAT MOD SED  03/10/2019   IR CT HEAD LTD  03/10/2019   IR PERCUTANEOUS ART THROMBECTOMY/INFUSION INTRACRANIAL INC DIAG ANGIO  03/10/2019   RADIOLOGY WITH ANESTHESIA N/A 03/10/2019   Procedure: IR WITH ANESTHESIA/CODE STROKE;  Surgeon: Radiologist, Medication, MD;  Location: [facility] OR;  Service: Radiology;  Laterality: N/A;    Family History  Problem Relation Age of Onset   Diabetes Other    Hypertension Other    Healthy Mother    Healthy Father     Social History   Socioeconomic History   Marital status: Married    Spouse name: [name]   Number of children: Not on file   Years of education: Not on file   Highest education level: Not on file  Occupational History   Not on file  Tobacco Use   Smoking status: Never   Smokeless tobacco: Never  Vaping Use   Vaping status: Never Used  Substance and Sexual Activity   Alcohol use: No   Drug use: No   Sexual activity:  Not Currently  Other Topics Concern   Not on file  Social History Narrative   12/16/19 Lives with wife   Social Determinants of Health   Financial Resource Strain: Low Risk  (12/06/2022)   Overall Financial Resource Strain (CARDIA)    Difficulty of Paying Living Expenses: Not hard at all  Food Insecurity: No Food Insecurity (12/06/2022)   Hunger Vital Sign    Worried About Running Out of Food in the Last Year: Never true    Ran Out of Food in the Last Year: Never true  Transportation Needs: No Transportation Needs (12/06/2022)   PRAPARE -  Administrator, Civil Service (Medical): No    Lack of Transportation (Non-Medical): No  Physical Activity: Inactive (12/06/2022)   Exercise Vital Sign    Days of Exercise per Week: 0 days    Minutes of Exercise per Session: 0 min  Stress: No Stress Concern Present (12/06/2022)   [employer] of Occupational Health - Occupational Stress Questionnaire    Feeling of Stress : Not at all  Social Connections: Not on file    No Known Allergies  Outpatient Medications Prior to Visit  Medication Sig Dispense Refill   acetaminophen (TYLENOL) 325 MG tablet Take 2 tablets (650 mg total) by mouth every 4 (four) hours as needed for mild pain (or temp > 37.5 C (99.5 F)).     aspirin EC 81 MG tablet Take 1 tablet (81 mg total) by mouth daily. 100 tablet 3   azelastine (ASTELIN) 0.1 % nasal spray Place 2 sprays into both nostrils 2 (two) times daily. Use in each nostril as directed 30 mL 12   amLODipine (NORVASC) 5 MG tablet Take 1 tablet (5 mg total) by mouth daily. To lower blood pressure 90 tablet 2   atorvastatin (LIPITOR) 40 MG tablet TAKE 1 TABLET BY MOUTH IN THE EVENING TO LOWER CHOLESTEROL. 90 tablet 2   cetirizine (ZYRTEC) 10 MG tablet Take 1 tablet by mouth once daily 90 tablet 0   chlorthalidone (HYGROTEN) 15 MG tablet Take 1 tablet (15 mg total) by mouth daily. 90 tablet 2   donepezil (ARICEPT) 10 MG tablet TAKE 1 TABLET BY MOUTH AT BEDTIME 90 tablet 1   losartan (COZAAR) 50 MG tablet 1 tab(s) orally once a day for 30 day(s) 90 tablet 2   metFORMIN (GLUCOPHAGE) 500 MG tablet TAKE 1 TABLET BY MOUTH TWICE DAILY WITH A MEAL 180 tablet 0   QUEtiapine (SEROQUEL) 100 MG tablet TAKE 1 TABLET BY MOUTH AT BEDTIME 30 tablet 4   traZODone (DESYREL) 50 MG tablet TAKE 1/2 TO 1 (ONE-HALF TO ONE) TABLET BY MOUTH AT BEDTIME AS NEEDED FOR SLEEP 30 tablet 0   metFORMIN (GLUCOPHAGE) 1000 MG tablet Take 1 tablet (1,000 mg total) by mouth daily with breakfast. 90 tablet 2   No facility-administered  medications prior to visit.     ROS Review of Systems  Constitutional:  Negative for activity change and appetite change.  HENT:  Negative for sinus pressure and sore throat.   Respiratory:  Positive for cough. Negative for chest tightness, shortness of breath and wheezing.   Cardiovascular:  Negative for chest pain and palpitations.  Gastrointestinal:  Negative for abdominal distention, abdominal pain and constipation.  Genitourinary: Negative.   Musculoskeletal: Negative.   Neurological:  Positive for weakness.  Psychiatric/Behavioral:  Negative for behavioral problems and dysphoric mood.     Objective:  BP 129/82   Pulse 76   Ht 6\' 4"  (1.93  m)   Wt 184 lb 6.4 oz (83.6 kg)   SpO2 97%   BMI 22.45 kg/m      07/03/2023    2:31 PM 03/27/2023    1:59 PM 12/19/2022    2:23 PM  BP/Weight  Systolic BP 129 123 105  Diastolic BP 82 74 68  Wt. (Lbs) 184.4 177.2   BMI 22.45 kg/m2 21.57 kg/m2       Physical Exam Constitutional:      Appearance: He is well-developed.  Cardiovascular:     Rate and Rhythm: Normal rate.     Heart sounds: Normal heart sounds. No murmur heard. Pulmonary:     Effort: Pulmonary effort is normal.     Breath sounds: Normal breath sounds. No wheezing or rales.  Chest:     Chest wall: No tenderness.  Abdominal:     General: Bowel sounds are normal. There is no distension.     Palpations: Abdomen is soft. There is no mass.     Tenderness: There is no abdominal tenderness.  Musculoskeletal:        General: Normal range of motion.     Right lower leg: No edema.     Left lower leg: No edema.     Comments: Abduction of right upper extremity limited to 90 degrees Unable to elevate left upper extremity  Neurological:     Mental Status: He is alert. Mental status is at baseline.     Motor: Weakness present.     Comments: Handgrip-3/5 on the left, 3+/5 on the right Strength in both lower extremity-3+/5  Psychiatric:        Mood and Affect: Mood  normal.        Latest Ref Rng & Units 03/27/2023    2:32 PM 10/03/2022    3:35 PM 03/28/2022    4:28 PM  CMP  Glucose 70 - 99 mg/dL 191  86  478   BUN 8 - 27 mg/dL 22  13  14    Creatinine 0.76 - 1.27 mg/dL 2.95  6.21  3.08   Sodium 134 - 144 mmol/L 140  139  142   Potassium 3.5 - 5.2 mmol/L 4.3  4.8  4.9   Chloride 96 - 106 mmol/L 100  99  105   CO2 20 - 29 mmol/L 30  25  24    Calcium 8.6 - 10.2 mg/dL 9.7  65.7  9.5   Total Protein 6.0 - 8.5 g/dL  7.1  7.2   Total Bilirubin 0.0 - 1.2 mg/dL  0.4  0.5   Alkaline Phos 44 - 121 IU/L  71  63   AST 0 - 40 IU/L  16  15   ALT 0 - 44 IU/L  14  16     Lipid Panel     Component Value Date/Time   CHOL 108 10/03/2022 1535   TRIG 52 10/03/2022 1535   HDL 49 10/03/2022 1535   CHOLHDL 2.2 10/03/2022 1535   CHOLHDL 3.5 03/11/2019 0946   VLDL 13 03/11/2019 0946   LDLCALC 47 10/03/2022 1535    CBC    Component Value Date/Time   WBC 5.3 03/28/2022 1628   WBC 5.3 02/17/2021 0256   RBC 3.95 (L) 03/28/2022 1628   RBC 4.03 (L) 02/17/2021 0256   HGB 12.1 (L) 03/28/2022 1628   HCT 36.3 (L) 03/28/2022 1628   PLT 219 03/28/2022 1628   MCV 92 03/28/2022 1628   MCH 30.6 03/28/2022 1628   MCH 30.5 02/17/2021  0256   MCHC 33.3 03/28/2022 1628   MCHC 32.8 02/17/2021 0256   RDW 12.0 03/28/2022 1628   LYMPHSABS 2.6 03/28/2022 1628   MONOABS 0.5 02/15/2021 1442   EOSABS 0.2 03/28/2022 1628   BASOSABS 0.0 03/28/2022 1628    Lab Results  Component Value Date   HGBA1C 7.2 (A) 07/03/2023    Assessment & Plan:      Chronic Cough Persistent cough since 2020, possibly postnasal drip superimposed on CVA deficit. No choking with meals or drinks, suggesting no significant dysphagia. -Start Zyrtec daily to help dry up secretions. -Yankeur suction device for oral secretions ordered, discuss with case manager fax this to DME company.  History of Stroke Noted weakness on both sides, more pronounced on the left. No recent physical therapy. -Refer  for physical therapy to improve strength and prevent stiffness. -Aspirin and statin for secondary prevention  Hypertension Well controlled on current regimen of Amlodipine, Chlorthalidone, and Losartan. -Continue current medications. -Counseled on blood pressure goal of less than 130/80, low-sodium, DASH diet, medication compliance, 150 minutes of moderate intensity exercise per week. Discussed medication compliance, adverse effects.   Type II Diabetes Mellitus A1c increased from 6.3 to 7.2 since July. Currently on Metformin 500mg  twice daily. -Increase Metformin to 1000mg  twice daily. -Encourage diet modification, reducing starch and increasing protein intake.  Hyperlipidemia On Atorvastatin for cholesterol management. -Continue Atorvastatin. -Low-cholesterol diet  Insomnia He has been on trazodone and Seroquel from previous PCP -At next visit we will discuss discontinuing one of them   Follow-up in 3 months for routine check and lab work.          Meds ordered this encounter  Medications   cetirizine (ZYRTEC) 10 MG tablet    Sig: Take 1 tablet (10 mg total) by mouth daily.    Dispense:  90 tablet    Refill:  1   amLODipine (NORVASC) 5 MG tablet    Sig: Take 1 tablet (5 mg total) by mouth daily. To lower blood pressure    Dispense:  90 tablet    Refill:  1   atorvastatin (LIPITOR) 40 MG tablet    Sig: TAKE 1 TABLET BY MOUTH IN THE EVENING TO LOWER CHOLESTEROL.    Dispense:  90 tablet    Refill:  1   donepezil (ARICEPT) 10 MG tablet    Sig: Take 1 tablet (10 mg total) by mouth at bedtime.    Dispense:  90 tablet    Refill:  1   losartan (COZAAR) 50 MG tablet    Sig: 1 tab(s) orally once a day for 30 day(s)    Dispense:  90 tablet    Refill:  1   QUEtiapine (SEROQUEL) 100 MG tablet    Sig: Take 1 tablet (100 mg total) by mouth at bedtime.    Dispense:  90 tablet    Refill:  1   traZODone (DESYREL) 50 MG tablet    Sig: Take 1 tablet (50 mg total) by mouth at  bedtime.    Dispense:  90 tablet    Refill:  1   chlorthalidone (HYGROTON) 25 MG tablet    Sig: Take 0.5 tablets (12.5 mg total) by mouth daily.    Dispense:  45 tablet    Refill:  1   metFORMIN (GLUCOPHAGE) 500 MG tablet    Sig: Take 2 tablets (1,000 mg total) by mouth 2 (two) times daily with a meal.    Dispense:  360 tablet    Refill:  1    Discontinue previous dose   Misc. Devices MISC    Sig: Yankeur. Diagnosis - CVA    Dispense:  1 each    Refill:  0    Follow-up: Return in about 6 months (around 01/01/2024) for Chronic medical conditions.       Hoy Register, MD, FAAFP. [facility] [place], [place] [phone]   07/03/2023, 3:42 PM

## 2023-07-03 NOTE — Patient Instructions (Signed)
VISIT SUMMARY:  During today's visit, we discussed your persistent cough, difficulty swallowing, and overall health management, including your stroke history, hypertension, diabetes, and sleep issues. We reviewed your current medications and made some adjustments to better manage your conditions.  YOUR PLAN:  -CHRONIC COUGH: Your persistent cough may be due to postnasal drip, which is when mucus from your nose drips down the back of your throat. We will start you on Zyrtec daily to help dry up these secretions. We also discussed the possibility of using a suction device for oral secretions, which we will talk about further with your case manager.  -HISTORY OF STROKE: You have some weakness on both sides of your body, more on the left. To help improve your strength and prevent stiffness, we will refer you for physical therapy.  -HYPERTENSION: Your blood pressure is well controlled with your current medications, so we will continue with Amlodipine, Chlorthalidone, and Losartan.  -DIABETES MELLITUS: Your blood sugar levels have increased slightly. To help manage this, we will increase your Metformin dose to 1000mg  twice daily. We also encourage you to modify your diet by reducing starch and increasing protein intake.  -HYPERLIPIDEMIA: You are taking Atorvastatin to manage your cholesterol levels, and we will continue with this medication.  -SECONDARY STROKE PREVENTION: You are taking Aspirin daily to help prevent another stroke, and we will continue with this medication.  -SLEEP ISSUES: You are currently on medication to help with sleep, and we will continue with this treatment.  INSTRUCTIONS:  Please follow up in 3 months for a routine check and lab work.

## 2023-07-09 ENCOUNTER — Ambulatory Visit: Payer: Medicare Other | Attending: Family Medicine | Admitting: Physical Therapy

## 2023-07-09 DIAGNOSIS — R2689 Other abnormalities of gait and mobility: Secondary | ICD-10-CM | POA: Insufficient documentation

## 2023-07-09 DIAGNOSIS — I6300 Cerebral infarction due to thrombosis of unspecified precerebral artery: Secondary | ICD-10-CM | POA: Insufficient documentation

## 2023-07-09 DIAGNOSIS — M6281 Muscle weakness (generalized): Secondary | ICD-10-CM | POA: Insufficient documentation

## 2023-07-09 DIAGNOSIS — R278 Other lack of coordination: Secondary | ICD-10-CM | POA: Insufficient documentation

## 2023-07-09 DIAGNOSIS — R2681 Unsteadiness on feet: Secondary | ICD-10-CM | POA: Insufficient documentation

## 2023-07-09 NOTE — Therapy (Deleted)
OUTPATIENT PHYSICAL THERAPY NEURO EVALUATION   Patient Name: Joe Perry. MRN: [id number] DOB:1950/12/22, 72 y.o., male Today's Date: 07/09/2023   PCP: Hoy Register, MD REFERRING PROVIDER: Hoy Register, MD  END OF SESSION:   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 ([name])    Diabetes mellitus without complication ([name])    Dysphagia    post stroke   Hypertension    Left upper lobe pneumonia 02/15/2021   Stroke [facility])    Past Surgical History:  Procedure Laterality Date   IR ANGIO INTRA EXTRACRAN SEL COM CAROTID INNOMINATE UNI L MOD SED  03/10/2019   IR ANGIO VERTEBRAL SEL SUBCLAVIAN INNOMINATE BILAT MOD SED  03/10/2019   IR CT HEAD LTD  03/10/2019   IR PERCUTANEOUS ART THROMBECTOMY/INFUSION INTRACRANIAL INC DIAG ANGIO  03/10/2019   RADIOLOGY WITH ANESTHESIA N/A 03/10/2019   Procedure: IR WITH ANESTHESIA/CODE STROKE;  Surgeon: Radiologist, Medication, MD;  Location: [facility] OR;  Service: Radiology;  Laterality: N/A;   Patient Active Problem List   Diagnosis Date Noted   Obesity (BMI 30.0-34.9) 03/27/2022   Left hemiparesis ([name]) 12/06/2020   Benign essential HTN    Cognitive deficit, post-stroke    Controlled type 2 diabetes mellitus with hyperglycemia, without long-term current use of insulin ([name])    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  Rationale for Evaluation and Treatment: {HABREHAB:27488}  SUBJECTIVE:                                                                                                                                                                                             SUBJECTIVE STATEMENT: *** Pt accompanied by: {accompnied:27141}  PERTINENT HISTORY: history of type 2 diabetes mellitus, hypertension, hyperlipidemia, insomnia, CVA with residual hemiparesis. (05/2019)  PAIN:  Are you having pain? {OPRCPAIN:27236}  PRECAUTIONS: {Therapy precautions:24002}  RED FLAGS: {PT Red Flags:29287}   WEIGHT BEARING RESTRICTIONS: {Yes ***/No:24003}  FALLS: Has patient fallen in last 6 months? {fallsyesno:27318}  LIVING ENVIRONMENT: Lives with: {  OPRC lives with:25569::"lives with their family"} Lives in: {Lives in:25570} Stairs: {opstairs:27293} Has following equipment at home: {Assistive devices:23999}  PLOF: {PLOF:24004}  PATIENT GOALS: ***  OBJECTIVE:  Note: Objective measures were completed at Evaluation unless otherwise noted.  DIAGNOSTIC FINDINGS: ***  COGNITION: Overall cognitive status: {cognition:24006}   SENSATION: {sensation:27233}  COORDINATION: ***  EDEMA:  {edema:24020}  MUSCLE TONE: {LE tone:25568}  MUSCLE LENGTH: Hamstrings: Right *** deg; Left *** deg Maisie Fus test: Right *** deg; Left *** deg  DTRs:  {DTR SITE:24025}  POSTURE: {posture:25561}  LOWER EXTREMITY ROM:     {AROM/PROM:27142}  Right Eval Left Eval  Hip flexion    Hip extension    Hip abduction    Hip adduction    Hip internal rotation    Hip external rotation    Knee flexion    Knee extension    Ankle dorsiflexion    Ankle plantarflexion    Ankle inversion    Ankle eversion     (Blank rows = not tested)  LOWER EXTREMITY MMT:    MMT Right Eval Left Eval  Hip flexion    Hip extension    Hip abduction    Hip adduction    Hip  internal rotation    Hip external rotation    Knee flexion    Knee extension    Ankle dorsiflexion    Ankle plantarflexion    Ankle inversion    Ankle eversion    (Blank rows = not tested)  BED MOBILITY:  {Bed mobility:24027}  TRANSFERS: Assistive device utilized: {Assistive devices:23999}  Sit to stand: {Levels of assistance:24026} Stand to sit: {Levels of assistance:24026} Chair to chair: {Levels of assistance:24026} Floor: {Levels of assistance:24026}  RAMP:  Level of Assistance: {Levels of assistance:24026} Assistive device utilized: {Assistive devices:23999} Ramp Comments: ***  CURB:  Level of Assistance: {Levels of assistance:24026} Assistive device utilized: {Assistive devices:23999} Curb Comments: ***  STAIRS: Level of Assistance: {Levels of assistance:24026} Stair Negotiation Technique: {Stair Technique:27161} with {Rail Assistance:27162} Number of Stairs: ***  Height of Stairs: ***  Comments: ***  GAIT: Gait pattern: {gait characteristics:25376} Distance walked: *** Assistive device utilized: {Assistive devices:23999} Level of assistance: {Levels of assistance:24026} Comments: ***  FUNCTIONAL TESTS:  {Functional tests:24029}  PATIENT SURVEYS:  {rehab surveys:24030}  TODAY'S TREATMENT:                                                                                                                              DATE: ***    PATIENT EDUCATION: Education details: *** Person educated: {Person educated:25204} Education method: {Education Method:25205} Education comprehension: {Education Comprehension:25206}  HOME EXERCISE PROGRAM: ***  GOALS: Goals reviewed with patient? {yes/no:20286}  SHORT TERM GOALS: Target date: ***  *** Baseline: Goal status: INITIAL  2.  *** Baseline:  Goal status: INITIAL  3.  *** Baseline:  Goal status: INITIAL  4.  *** Baseline:  Goal status: INITIAL  5.  *** Baseline:  Goal status: INITIAL  6.   ***  Baseline:  Goal status: INITIAL  LONG TERM GOALS: Target date: ***  *** Baseline:  Goal status: INITIAL  2.  *** Baseline:  Goal status: INITIAL  3.  *** Baseline:  Goal status: INITIAL  4.  *** Baseline:  Goal status: INITIAL  5.  *** Baseline:  Goal status: INITIAL  6.  *** Baseline:  Goal status: INITIAL  ASSESSMENT:  CLINICAL IMPRESSION: Patient is a *** y.o. *** who was seen today for physical therapy evaluation and treatment for ***.   OBJECTIVE IMPAIRMENTS: {opptimpairments:25111}.   ACTIVITY LIMITATIONS: {activitylimitations:27494}  PARTICIPATION LIMITATIONS: {participationrestrictions:25113}  PERSONAL FACTORS: {Personal factors:25162} are also affecting patient's functional outcome.   REHAB POTENTIAL: {rehabpotential:25112}  CLINICAL DECISION MAKING: {clinical decision making:25114}  EVALUATION COMPLEXITY: {Evaluation complexity:25115}  PLAN:  PT FREQUENCY: {rehab frequency:25116}  PT DURATION: {rehab duration:25117}  PLANNED INTERVENTIONS: {rehab planned interventions:25118::"97110-Therapeutic exercises","97530- Therapeutic [phone]- Neuromuscular re-education","97535- Self JXBJ","47829- Manual therapy"}  PLAN FOR NEXT SESSION: ***   Drake Leach, PT, DPT 07/09/2023, 8:15 AM

## 2023-07-10 ENCOUNTER — Other Ambulatory Visit: Payer: Self-pay

## 2023-07-10 ENCOUNTER — Ambulatory Visit: Payer: Medicare Other

## 2023-07-10 DIAGNOSIS — R2689 Other abnormalities of gait and mobility: Secondary | ICD-10-CM

## 2023-07-10 DIAGNOSIS — R2681 Unsteadiness on feet: Secondary | ICD-10-CM

## 2023-07-10 DIAGNOSIS — I6300 Cerebral infarction due to thrombosis of unspecified precerebral artery: Secondary | ICD-10-CM | POA: Diagnosis present

## 2023-07-10 DIAGNOSIS — M6281 Muscle weakness (generalized): Secondary | ICD-10-CM | POA: Diagnosis present

## 2023-07-10 DIAGNOSIS — R278 Other lack of coordination: Secondary | ICD-10-CM | POA: Diagnosis present

## 2023-07-10 NOTE — Therapy (Signed)
OUTPATIENT PHYSICAL THERAPY NEURO EVALUATION   Patient Name: Joe VANVALKENBURGH Sr. MRN: [id number] DOB:1951/02/13, 72 y.o., male Today's Date: 07/10/2023   PCP: Hoy Register, MD REFERRING PROVIDER: Hoy Register, MD  END OF SESSION:  PT End of Session - 07/10/23 1310     Visit Number 1    Number of Visits 13    Date for PT Re-Evaluation 08/21/23    Authorization Type [employer] Medicare    PT Start Time 1315    PT Stop Time 1400    PT Time Calculation (min) 45 min    Equipment Utilized During Treatment Gait belt    Activity Tolerance Patient tolerated treatment well    Behavior During Therapy WFL for tasks assessed/performed             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 ([name])    Diabetes mellitus without complication ([name])    Dysphagia    post stroke   Hypertension    Left upper lobe pneumonia 02/15/2021   Stroke [facility])    Past Surgical History:  Procedure Laterality Date   IR ANGIO INTRA EXTRACRAN SEL COM CAROTID INNOMINATE UNI L MOD SED  03/10/2019   IR ANGIO VERTEBRAL SEL SUBCLAVIAN INNOMINATE BILAT MOD SED  03/10/2019   IR CT HEAD LTD  03/10/2019   IR PERCUTANEOUS ART THROMBECTOMY/INFUSION INTRACRANIAL INC DIAG ANGIO  03/10/2019   RADIOLOGY WITH ANESTHESIA N/A 03/10/2019   Procedure: IR WITH ANESTHESIA/CODE STROKE;  Surgeon: Radiologist, Medication, MD;  Location: [facility] OR;  Service: Radiology;  Laterality: N/A;   Patient Active Problem List   Diagnosis Date Noted   Obesity (BMI 30.0-34.9) 03/27/2022   Left hemiparesis ([name]) 12/06/2020   Benign essential HTN    Cognitive deficit, post-stroke    Controlled type 2 diabetes mellitus with hyperglycemia, without long-term current use of insulin ([name])    Vestibular schwannoma ([name]) 06/03/2019    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   Dyslipidemia, goal LDL below 70 03/12/2019   Dysphagia due to recent stroke 03/12/2019   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, d/t large vessel dz 03/10/2019   Occlusion of right posterior communicating artery 03/10/2019   Gait disturbance, post-stroke    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    ONSET DATE: 07/03/2023  REFERRING DIAG:  I69.30 (ICD-10-CM) - History of CVA with residual deficit    THERAPY DIAG:  Other abnormalities of gait and mobility  Unsteadiness on feet  Muscle weakness (generalized)  Cerebrovascular accident (CVA) due to thrombosis of precerebral artery ([name])  Rationale for Evaluation and Treatment: Rehabilitation  SUBJECTIVE:  SUBJECTIVE STATEMENT: Pt accompanied wife. Reports no change in his walking since last bout of therapy. Wife denies any falls at home. Last stroke was in 2020. Previous stroke was in 2012. Pt reports of R side of the UE and LE being weaker.  Pt accompanied by: self  PERTINENT HISTORY: Hx of multiple strokes.  PAIN:  Are you having pain? No  PRECAUTIONS: Fall  RED FLAGS: None   WEIGHT BEARING RESTRICTIONS: No  FALLS: Has patient fallen in last 6 months? No  LIVING ENVIRONMENT: Lives with: lives with their spouse Lives in: House/apartment Stairs: Yes: Internal: 12 steps; on left going up and has rails for the first 6 steps and then rest has rail on left hand side and External: 4 steps; can reach both Has following equipment at home: Dan Humphreys - 2 wheeled, Wheelchair (manual), and shower chair  PLOF: Needs assistance with ADLs, Needs assistance with homemaking, and Needs assistance with gait  PATIENT GOALS: Make the walking better  OBJECTIVE:  Note: Objective measures were completed at Evaluation  unless otherwise noted.  COGNITION: Overall cognitive status: Impaired Pt unable to state the current year or his full address. Pt has aphasia.   SENSATION:  LOWER EXTREMITY ROM:     Active  Right Eval Left Eval  Hip flexion    Hip extension    Hip abduction    Hip adduction    Hip internal rotation    Hip external rotation    Knee flexion    Knee extension    Ankle dorsiflexion    Ankle plantarflexion    Ankle inversion    Ankle eversion     (Blank rows = not tested)  LOWER EXTREMITY MMT:    MMT Right Eval Left Eval  Hip flexion    Hip extension    Hip abduction    Hip adduction    Hip internal rotation    Hip external rotation    Knee flexion    Knee extension    Ankle dorsiflexion    Ankle plantarflexion    Ankle inversion    Ankle eversion    (Blank rows = not tested)  BED MOBILITY:  Sit to supine Min A occaisonally; supine to sit is I per pt report   GAIT: Gait pattern: decreased arm swing- Right, decreased arm swing- Left, decreased step length- Right, decreased step length- Left, decreased stance time- Right, decreased stance time- Left, decreased stride length, decreased hip/knee flexion- Right, decreased hip/knee flexion- Left, decreased ankle dorsiflexion- Right, decreased ankle dorsiflexion- Left, Right foot flat, Left foot flat, scissoring, decreased trunk rotation, narrow BOS, poor foot clearance- Right, and poor foot clearance- Left Distance walked: 115' total during the evaluation Assistive device utilized: Walker - 2 wheeled Level of assistance: SBA and CGA   FUNCTIONAL TESTS:  5 times sit to stand: 61 sec with bil UE support and very poor eccentric control even with VC Timed up and go (TUG): 99 sec with RW 10 meter walk test: 0.09 m/w with RW    TODAY'S TREATMENT:  DATE:   Sit to stand: verbal cueing provided for  patient to use chair arm rest, but pt had difficulty with following instructions and failed to use arms 100% of the time within 5 reps. Pt has poor eccentric control.  Wife was educated that due to impaired memory, and difficulty with following instructions, and chronicity of his stroke, he has limited potential and we will trial 12 sessions of therapy to see if we can improve his functional mobility and gait. Wife verbalized agreement.   PATIENT EDUCATION: Education details: see above Person educated: Patient and Spouse Education method: Medical illustrator Education comprehension: verbalized understanding  HOME EXERCISE PROGRAM: Access Code: [email or web address] URL: [email or web address] Date: 07/10/2023 Prepared by: Lavone Nian  Exercises - Sit to Stand with Counter Support  - 1 x daily - 7 x weekly - 5 reps  GOALS: Goals reviewed with patient? Yes  SHORT TERM GOALS: Target date: 08/07/2023     Patient will demo compliance with HEP for 50% of the time to improve self management of symptoms Baseline: issued 07/10/23 Goal status: INITIAL  2.  LONG TERM GOALS: Target date: 09/04/2023    Patient will demo <85 sec with TUG to improve functional mobility at home. Baseline: 99 sec RW (07/10/23) Goal status: INITIAL  2.  Patient will demo gait speed of 0.15 m/s to improve functional mobility and decrease fall risk Baseline: 0.44m/s with RW (07/10/23) Goal status: INITIAL  3.  Patient will be able to perform 5 x sit to stand with use of bil UE in <50 sec to improve funcitonal strength. Baseline: 61 sec with with use of bil UE Goal status: INITIAL    ASSESSMENT:  CLINICAL IMPRESSION: Patient is a 72 y.o. male who was seen today for physical therapy evaluation and treatment for gait and mobility disorder due to previous stroke. Patient demonstrates significant impairment with his gait, functional mobility, and transfers. Patient has low rehab potential due to  chronic nature of stroke and impaired cognition and impaired ability to follow directions. Pt will benefit from short bout of therapy to help patient improve transfers, functional mobility and gait.  OBJECTIVE IMPAIRMENTS: Abnormal gait, decreased activity tolerance, decreased balance, decreased endurance, decreased mobility, difficulty walking, decreased strength, decreased safety awareness, hypomobility, impaired flexibility, impaired tone, impaired UE functional use, improper body mechanics, and postural dysfunction.   ACTIVITY LIMITATIONS: carrying, lifting, bending, standing, squatting, stairs, transfers, bed mobility, bathing, toileting, and dressing  PARTICIPATION LIMITATIONS: meal prep, cleaning, laundry, driving, shopping, community activity, and yard work  PERSONAL FACTORS: Age and Time since onset of injury/illness/exacerbation are also affecting patient's functional outcome.   REHAB POTENTIAL: Fair chronicity  CLINICAL DECISION MAKING: Stable/uncomplicated  EVALUATION COMPLEXITY: Low  PLAN:  PT FREQUENCY: 2x/week  PT DURATION: 8 weeks  PLANNED INTERVENTIONS: 97164- PT Re-evaluation, 97110-Therapeutic exercises, 97530- Therapeutic activity, 97112- Neuromuscular re-education, 97535- Self Care, 54098- Manual therapy, [phone]- Gait training, [phone]- Orthotic Fit/training, [phone]- Aquatic Therapy, Patient/Family education, Balance training, Stair training, Joint mobilization, Cryotherapy, and Moist heat  PLAN FOR NEXT SESSION: Work towards [employer], PT 07/10/2023, 1:10 PM

## 2023-07-16 ENCOUNTER — Ambulatory Visit: Payer: Medicare Other | Admitting: Physical Therapy

## 2023-07-16 DIAGNOSIS — M6281 Muscle weakness (generalized): Secondary | ICD-10-CM

## 2023-07-16 DIAGNOSIS — R2681 Unsteadiness on feet: Secondary | ICD-10-CM

## 2023-07-16 DIAGNOSIS — R2689 Other abnormalities of gait and mobility: Secondary | ICD-10-CM | POA: Diagnosis not present

## 2023-07-16 DIAGNOSIS — I6300 Cerebral infarction due to thrombosis of unspecified precerebral artery: Secondary | ICD-10-CM

## 2023-07-16 NOTE — Therapy (Signed)
OUTPATIENT PHYSICAL THERAPY NEURO EVALUATION   Patient Name: Joe Perry Sr. MRN: [id number] DOB:[birth year], 72 y.o., male Today's Date: 07/16/2023   PCP: Hoy Register, MD REFERRING PROVIDER: Hoy Register, MD  END OF SESSION:  PT End of Session - 07/16/23 1445     Visit Number 2    Number of Visits 13    Date for PT Re-Evaluation 08/21/23    Authorization Type [employer] Medicare    PT Start Time 1445    PT Stop Time 1525    PT Time Calculation (min) 40 min    Equipment Utilized During Treatment Gait belt    Activity Tolerance Patient tolerated treatment well    Behavior During Therapy WFL for tasks assessed/performed             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 ([name])    Diabetes mellitus without complication ([name])    Dysphagia    post stroke   Hypertension    Left upper lobe pneumonia 02/15/2021   Stroke [facility])    Past Surgical History:  Procedure Laterality Date   IR ANGIO INTRA EXTRACRAN SEL COM CAROTID INNOMINATE UNI L MOD SED  03/10/2019   IR ANGIO VERTEBRAL SEL SUBCLAVIAN INNOMINATE BILAT MOD SED  03/10/2019   IR CT HEAD LTD  03/10/2019   IR PERCUTANEOUS ART THROMBECTOMY/INFUSION INTRACRANIAL INC DIAG ANGIO  03/10/2019   RADIOLOGY WITH ANESTHESIA N/A 03/10/2019   Procedure: IR WITH ANESTHESIA/CODE STROKE;  Surgeon: Radiologist, Medication, MD;  Location: [facility] OR;  Service: Radiology;  Laterality: N/A;   Patient Active Problem List   Diagnosis Date Noted   Obesity (BMI 30.0-34.9) 03/27/2022   Left hemiparesis ([name]) 12/06/2020   Benign essential HTN    Cognitive deficit, post-stroke    Controlled type 2 diabetes mellitus with hyperglycemia, without long-term current use of insulin ([name])    Vestibular schwannoma ([name]) 06/03/2019    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  Rationale for Evaluation and Treatment: Rehabilitation  SUBJECTIVE:  SUBJECTIVE STATEMENT: Pt accompanied wife. Family states no new falls or changes.  Pt accompanied by: self  PERTINENT HISTORY: Hx of multiple strokes.  PAIN:  Are you having pain? No  PRECAUTIONS: Fall  RED FLAGS: None   WEIGHT BEARING RESTRICTIONS: No  FALLS: Has patient fallen in last 6 months? No  LIVING ENVIRONMENT: Lives with: lives with their spouse Lives in: House/apartment Stairs: Yes: Internal: 12 steps; on left going up and has rails for the first 6 steps and then rest has rail on left hand side and External: 4 steps; can reach both Has following equipment at home: Dan Humphreys - 2 wheeled, Wheelchair (manual), and shower chair  PLOF: Needs assistance with ADLs, Needs assistance with homemaking, and Needs assistance with gait  PATIENT GOALS: Make the walking better  OBJECTIVE:  Note: Objective measures were completed at Evaluation unless otherwise noted.  COGNITION: Overall cognitive status: Impaired Pt unable to state the current year or his full address. Pt has aphasia.  LOWER EXTREMITY ROM:   Limited due to cognitive status  LOWER EXTREMITY MMT:  Limited due to cognitive status  BED MOBILITY:  Sit to supine Min A occaisonally;  supine to sit is I per pt report   GAIT: Gait pattern: decreased arm swing- Right, decreased arm swing- Left, decreased step length- Right, decreased step length- Left, decreased stance time- Right, decreased stance time- Left, decreased stride length, decreased hip/knee flexion- Right, decreased hip/knee flexion- Left, decreased ankle dorsiflexion- Right, decreased ankle dorsiflexion- Left, Right foot flat, Left foot flat, scissoring, decreased trunk rotation, narrow BOS, poor foot clearance- Right, and poor foot clearance- Left Distance walked: 115' total during the evaluation Assistive device utilized: Walker - 2 wheeled Level of assistance: SBA and CGA   FUNCTIONAL TESTS:  5 times sit to stand: 61 sec with bil UE support and very poor eccentric control even with VC Timed up and go (TUG): 99 sec with RW 10 meter walk test: 0.09 m/w with RW    TODAY'S TREATMENT:                                                                                                                              DATE:   07/16/23 Seated hip flexion x10, with 3# ankle weight x10 Seated knee extension 2x10 with 3# ankle weight Sit<>stand x10 Standing with bilat overhead reach + glute set x10 Step forward tap on yellow target with 3# ankle weight 2x10 Step side tap on yellow target with 3# ankle weight 2x10   PATIENT EDUCATION: Education details: see above Person educated: Patient and Spouse Education method: Explanation and Demonstration Education comprehension: verbalized understanding  HOME EXERCISE PROGRAM: Access Code: [email or web address] URL: [email or web address] Date: 07/10/2023 Prepared by: Lavone Nian  Exercises - Sit to Stand with Counter Support  - 1 x daily - 7 x weekly - 5 reps  GOALS: Goals reviewed with patient? Yes  SHORT TERM GOALS: Target date: 08/07/2023     Patient  will demo compliance with HEP for 50% of the time to improve self management of symptoms Baseline: issued  07/10/23 Goal status: INITIAL  2.  LONG TERM GOALS: Target date: 09/04/2023    Patient will demo <85 sec with TUG to improve functional mobility at home. Baseline: 99 sec RW (07/10/23) Goal status: INITIAL  2.  Patient will demo gait speed of 0.15 m/s to improve functional mobility and decrease fall risk Baseline: 0.75m/s with RW (07/10/23) Goal status: INITIAL  3.  Patient will be able to perform 5 x sit to stand with use of bil UE in <50 sec to improve funcitonal strength. Baseline: 61 sec with with use of bil UE Goal status: INITIAL    ASSESSMENT:  CLINICAL IMPRESSION: Pt requires consistent tactile cueing to maintain correct form for exercises. Worked on strengthening and weight shifting this session with ankle weights. Worked on fast walking to try and improve carry over to gait speed. Able to increase speed with PT assist for RW negotiation.   From eval: Patient is a 72 y.o. male who was seen today for physical therapy evaluation and treatment for gait and mobility disorder due to previous stroke. Patient demonstrates significant impairment with his gait, functional mobility, and transfers. Patient has low rehab potential due to chronic nature of stroke and impaired cognition and impaired ability to follow directions. Pt will benefit from short bout of therapy to help patient improve transfers, functional mobility and gait.  OBJECTIVE IMPAIRMENTS: Abnormal gait, decreased activity tolerance, decreased balance, decreased endurance, decreased mobility, difficulty walking, decreased strength, decreased safety awareness, hypomobility, impaired flexibility, impaired tone, impaired UE functional use, improper body mechanics, and postural dysfunction.     PLAN:  PT FREQUENCY: 2x/week  PT DURATION: 8 weeks  PLANNED INTERVENTIONS: 97164- PT Re-evaluation, 97110-Therapeutic exercises, 97530- Therapeutic activity, 97112- Neuromuscular re-education, 97535- Self Care, 47425- Manual  therapy, [phone]- Gait training, [phone]- Orthotic Fit/training, [phone]- Aquatic Therapy, Patient/Family education, Balance training, Stair training, Joint mobilization, Cryotherapy, and Moist heat  PLAN FOR NEXT SESSION: Work towards [employer] April Ma L [place], PT 07/16/2023, 2:45 PM

## 2023-07-18 ENCOUNTER — Ambulatory Visit: Payer: Medicare Other

## 2023-07-18 DIAGNOSIS — R2689 Other abnormalities of gait and mobility: Secondary | ICD-10-CM | POA: Diagnosis not present

## 2023-07-18 DIAGNOSIS — R278 Other lack of coordination: Secondary | ICD-10-CM

## 2023-07-18 DIAGNOSIS — M6281 Muscle weakness (generalized): Secondary | ICD-10-CM

## 2023-07-18 DIAGNOSIS — R2681 Unsteadiness on feet: Secondary | ICD-10-CM

## 2023-07-18 NOTE — Therapy (Signed)
OUTPATIENT PHYSICAL THERAPY NEURO EVALUATION   Patient Name: Joe HANDFIELD Sr. MRN: [id number] DOB:01/29/1951, 72 y.o., male Today's Date: 07/18/2023   PCP: Hoy Register, MD REFERRING PROVIDER: Hoy Register, MD  END OF SESSION:    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 ([name])    Diabetes mellitus without complication ([name])    Dysphagia    post stroke   Hypertension    Left upper lobe pneumonia 02/15/2021   Stroke [facility])    Past Surgical History:  Procedure Laterality Date   IR ANGIO INTRA EXTRACRAN SEL COM CAROTID INNOMINATE UNI L MOD SED  03/10/2019   IR ANGIO VERTEBRAL SEL SUBCLAVIAN INNOMINATE BILAT MOD SED  03/10/2019   IR CT HEAD LTD  03/10/2019   IR PERCUTANEOUS ART THROMBECTOMY/INFUSION INTRACRANIAL INC DIAG ANGIO  03/10/2019   RADIOLOGY WITH ANESTHESIA N/A 03/10/2019   Procedure: IR WITH ANESTHESIA/CODE STROKE;  Surgeon: Radiologist, Medication, MD;  Location: [facility] OR;  Service: Radiology;  Laterality: N/A;   Patient Active Problem List   Diagnosis Date Noted   Obesity (BMI 30.0-34.9) 03/27/2022   Left hemiparesis ([name]) 12/06/2020   Benign essential HTN    Cognitive deficit, post-stroke    Controlled type 2 diabetes mellitus with hyperglycemia, without long-term current use of insulin ([name])    Vestibular schwannoma ([name]) 06/03/2019   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  Rationale for Evaluation and Treatment: Rehabilitation  SUBJECTIVE:                                                                                                                                                                                             SUBJECTIVE STATEMENT: Patient presented to physical therapy with RW and accompanied by wife. Wife stated no changes or falls since last visit.  Pt accompanied by: significant other  PERTINENT HISTORY: Hx of multiple strokes.  PAIN:  Are you having pain? No  PRECAUTIONS: Fall  RED FLAGS: None   WEIGHT BEARING RESTRICTIONS: No  FALLS: Has patient fallen  in last 6 months? No  LIVING ENVIRONMENT: Lives with: lives with their spouse Lives in: House/apartment Stairs: Yes: Internal: 12 steps; on left going up and has rails for the first 6 steps and then rest has rail on left hand side and External: 4 steps; can reach both Has following equipment at home: Dan Humphreys - 2 wheeled, Wheelchair (manual), and shower chair  PLOF: Needs assistance with ADLs, Needs assistance with homemaking, and Needs assistance with gait  PATIENT GOALS: Make the walking better  OBJECTIVE:  Note: Objective measures were completed at Evaluation unless otherwise noted.  COGNITION: Overall cognitive status: Impaired Pt unable to state the current year or his full address. Pt has aphasia.  LOWER EXTREMITY ROM:   Limited due to cognitive status  LOWER EXTREMITY MMT:  Limited due to cognitive status  BED MOBILITY:  Sit to supine Min A occaisonally; supine to sit is I per pt report   GAIT: Gait pattern: decreased arm swing- Right, decreased arm swing- Left, decreased step length- Right, decreased step length- Left, decreased stance time- Right, decreased stance time- Left, decreased stride length, decreased hip/knee flexion- Right, decreased hip/knee flexion- Left, decreased ankle dorsiflexion-  Right, decreased ankle dorsiflexion- Left, Right foot flat, Left foot flat, scissoring, decreased trunk rotation, narrow BOS, poor foot clearance- Right, and poor foot clearance- Left Distance walked: 115' total during the evaluation Assistive device utilized: Walker - 2 wheeled Level of assistance: SBA and CGA   FUNCTIONAL TESTS:  5 times sit to stand: 61 sec with bil UE support and very poor eccentric control even with VC Timed up and go (TUG): 99 sec with RW 10 meter walk test: 0.09 m/w with RW    TODAY'S TREATMENT:                                                                                                                                Seated rocker board - A/P plane   - dorsiflexion/plantarflexion motion and control   Standing hamstring kicks - 4lb ankle weights   - standing at RW for UE support   Seated marching - 4lb ankle weights   - hip flexion   Gait: - 115' - 4lb ankle weights  - 115' - w/o ankle weights   - SPT pulling RW forward for increased gait speed   Parallel Bars:   - fwd/bkwd  - lateral   PATIENT EDUCATION: Education details: see above Person educated: Patient and Spouse Education method: Explanation and Demonstration Education comprehension: verbalized understanding  HOME EXERCISE PROGRAM: Access Code: [email or web address] URL: [email or web address] Date: 07/10/2023 Prepared by: Lavone Nian  Exercises - Sit to Stand with Counter Support  - 1 x daily - 7 x weekly - 5 reps  GOALS: Goals reviewed with patient? Yes  SHORT TERM GOALS: Target date: 08/07/2023     Patient will demo compliance with HEP for 50% of the time to improve self management of symptoms Baseline: issued 07/10/23 Goal  status: INITIAL  2.  LONG TERM GOALS: Target date: 09/04/2023    Patient will demo <85 sec with TUG to improve functional mobility at home. Baseline: 99 sec RW (07/10/23) Goal status: INITIAL  2.  Patient will demo gait speed of 0.15 m/s to  improve functional mobility and decrease fall risk Baseline: 0.76m/s with RW (07/10/23) Goal status: INITIAL  3.  Patient will be able to perform 5 x sit to stand with use of bil UE in <50 sec to improve funcitonal strength. Baseline: 61 sec with with use of bil UE Goal status: INITIAL    ASSESSMENT:  CLINICAL IMPRESSION: Patient seen for skilled physical therapy with emphasis on LE strengthening and gait training. Patient did well with seated exercises with verbal cueing to continue going. Patient required verbal and tactile cueing for standing exercises. Completed one lap around gym with and ankle weights and one lap without ankle weights. No significant change in gait speed with the change in weight. When PT CGA and SPT initiating walker forward gait speed improved. Gait speed was also improved when walking forward in the parallel bars. Continued verbal cueing to pick up his feet and take bigger steps. Continue POC.     OBJECTIVE IMPAIRMENTS: Abnormal gait, decreased activity tolerance, decreased balance, decreased endurance, decreased mobility, difficulty walking, decreased strength, decreased safety awareness, hypomobility, impaired flexibility, impaired tone, impaired UE functional use, improper body mechanics, and postural dysfunction.     PLAN:  PT FREQUENCY: 2x/week  PT DURATION: 8 weeks  PLANNED INTERVENTIONS: 97164- PT Re-evaluation, 97110-Therapeutic exercises, 97530- Therapeutic activity, 97112- Neuromuscular re-education, 97535- Self Care, 78295- Manual therapy, [phone]- Gait training, [phone]- Orthotic Fit/training, [phone]- Aquatic Therapy, Patient/Family education, Balance training, Stair training, Joint mobilization, Cryotherapy, and Moist heat  PLAN FOR NEXT SESSION: Work towards LTGs, potentially TM training, potentially try gait training with cane    Kischa Altice M Sinthia Karabin, SPT 07/18/2023, 2:44 PM

## 2023-07-23 ENCOUNTER — Ambulatory Visit: Payer: Medicare Other

## 2023-07-23 DIAGNOSIS — R278 Other lack of coordination: Secondary | ICD-10-CM

## 2023-07-23 DIAGNOSIS — R2689 Other abnormalities of gait and mobility: Secondary | ICD-10-CM | POA: Diagnosis not present

## 2023-07-23 DIAGNOSIS — M6281 Muscle weakness (generalized): Secondary | ICD-10-CM

## 2023-07-23 DIAGNOSIS — R2681 Unsteadiness on feet: Secondary | ICD-10-CM

## 2023-07-23 NOTE — Therapy (Signed)
OUTPATIENT PHYSICAL THERAPY NEURO EVALUATION   Patient Name: Joe Perry Sr. MRN: [id number] DOB:1950/09/09, 72 y.o., male Today's Date: 07/23/2023   PCP: Hoy Register, MD REFERRING PROVIDER: Hoy Register, MD  END OF SESSION:  PT End of Session - 07/23/23 1448     Visit Number 4    Number of Visits 13    Date for PT Re-Evaluation 08/21/23    Authorization Type [employer] Medicare    PT Start Time 1448    PT Stop Time 1532    PT Time Calculation (min) 44 min    Equipment Utilized During Treatment Gait belt    Activity Tolerance Patient tolerated treatment well              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 ([name])    Diabetes mellitus without complication ([name])    Dysphagia    post stroke   Hypertension    Left upper lobe pneumonia 02/15/2021   Stroke [facility])    Past Surgical History:  Procedure Laterality Date   IR ANGIO INTRA EXTRACRAN SEL COM CAROTID INNOMINATE UNI L MOD SED  03/10/2019   IR ANGIO VERTEBRAL SEL SUBCLAVIAN INNOMINATE BILAT MOD SED  03/10/2019   IR CT HEAD LTD  03/10/2019   IR PERCUTANEOUS ART THROMBECTOMY/INFUSION INTRACRANIAL INC DIAG ANGIO  03/10/2019   RADIOLOGY WITH ANESTHESIA N/A 03/10/2019   Procedure: IR WITH ANESTHESIA/CODE STROKE;  Surgeon: Radiologist, Medication, MD;  Location: [facility] OR;  Service: Radiology;  Laterality: N/A;   Patient Active Problem List   Diagnosis Date Noted   Obesity (BMI 30.0-34.9) 03/27/2022   Left hemiparesis ([name]) 12/06/2020   Benign essential HTN    Cognitive deficit, post-stroke    Controlled type 2 diabetes mellitus with hyperglycemia, without long-term current use of insulin ([name])    Vestibular schwannoma ([name]) 06/03/2019   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   Dyslipidemia, goal LDL below  70 03/12/2019   Dysphagia due to recent stroke 03/12/2019   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, d/t large vessel dz 03/10/2019   Occlusion of right posterior communicating artery 03/10/2019   Gait disturbance, post-stroke    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    ONSET DATE: 07/03/2023  REFERRING DIAG:  I69.30 (ICD-10-CM) - History of CVA with residual deficit    THERAPY DIAG:  Other abnormalities of gait and mobility  Unsteadiness on feet  Muscle weakness (generalized)  Other lack of coordination  Rationale for Evaluation and Treatment: Rehabilitation  SUBJECTIVE:  SUBJECTIVE STATEMENT: Patient presented to physical therapy with RW and accompanied by wife. Wife stated he had one fall on Saturday and she had to help him get up. No injuries from the fall. Wife reported she has been helping him guide his walker which has been helping.   Pt accompanied by: significant other  PERTINENT HISTORY: Hx of multiple strokes.  PAIN:  Are you having pain? No  PRECAUTIONS: Fall  RED FLAGS: None   WEIGHT BEARING RESTRICTIONS: No  FALLS: Has patient fallen in last 6 months? No  LIVING ENVIRONMENT: Lives with: lives with their spouse Lives in: House/apartment Stairs: Yes: Internal: 12 steps; on left going up and has rails for the first 6 steps and then rest has rail on left hand side and External: 4 steps; can reach both Has following equipment at home: Dan Humphreys - 2 wheeled, Wheelchair (manual), and shower chair  PLOF: Needs assistance with ADLs, Needs assistance with homemaking, and Needs assistance with gait  PATIENT GOALS: Make the walking better  OBJECTIVE:  Note: Objective measures were completed at Evaluation unless otherwise noted.  COGNITION: Overall cognitive status: Impaired  Pt unable to state the current year or his full address. Pt has aphasia.  LOWER EXTREMITY ROM:   Limited due to cognitive status  LOWER EXTREMITY MMT:  Limited due to cognitive status  BED MOBILITY:  Sit to supine Min A occaisonally; supine to sit is I per pt report   GAIT: Gait pattern: decreased arm swing- Right, decreased arm swing- Left, decreased step length- Right, decreased step length- Left, decreased stance time- Right, decreased stance time- Left, decreased stride length, decreased hip/knee flexion- Right, decreased hip/knee flexion- Left, decreased ankle dorsiflexion- Right, decreased ankle dorsiflexion- Left, Right foot flat, Left foot flat, scissoring, decreased trunk rotation, narrow BOS, poor foot clearance- Right, and poor foot clearance- Left Distance walked: 115' total during the evaluation Assistive device utilized: Walker - 2 wheeled Level of assistance: SBA and CGA   FUNCTIONAL TESTS:  5 times sit to stand: 61 sec with bil UE support and very poor eccentric control even with VC Timed up and go (TUG): 99 sec with RW 10 meter walk test: 0.09 m/w with RW    TODAY'S TREATMENT:                                                                                                                                BP 134/67 HR 83  TM: - 3 min, 0.4 mph   - 3 min, 0.5 mph    - 2 min, 0.6 mph 1 min - 0.5 mph 1 min  - 0.6 mph increased breathing rate significantly   Quad Cane: - trial gait ~25 ft    PATIENT EDUCATION: Education details: see above Person educated: Patient and Spouse Education method: Medical illustrator Education comprehension: verbalized understanding  HOME EXERCISE PROGRAM: Access Code: [email or web address] URL: [email or web address] Date: 07/10/2023 Prepared by: Lavone Nian  Exercises -  Sit to Stand with Counter Support  - 1 x daily - 7 x weekly - 5 reps  GOALS: Goals reviewed with patient? Yes  SHORT TERM GOALS: Target date:  08/07/2023     Patient will demo compliance with HEP for 50% of the time to improve self management of symptoms Baseline: issued 07/10/23 Goal status: INITIAL  2.  LONG TERM GOALS: Target date: 09/04/2023    Patient will demo <85 sec with TUG to improve functional mobility at home. Baseline: 99 sec RW (07/10/23) Goal status: INITIAL  2.  Patient will demo gait speed of 0.15 m/s to improve functional mobility and decrease fall risk Baseline: 0.77m/s with RW (07/10/23) Goal status: INITIAL  3.  Patient will be able to perform 5 x sit to stand with use of bil UE in <50 sec to improve funcitonal strength. Baseline: 61 sec with with use of bil UE Goal status: INITIAL    ASSESSMENT:  CLINICAL IMPRESSION: Patient seen for skilled physical therapy with emphasis on gait training. Patient did well with treadmill intervals. Required significant verbal and tactile cueing to increase step length on the L LE and improve foot clearance on the L LE. Was utilizing a step to pattern at 0.5 mph, tried to increase TM speed to encourage step through pattern at 0.6 mph, which increased breathing rate significantly. Tried small base quad cane to see if it improved gait speed. Patient was able to advance cane well but required assistance for stability and cueing to advance L LE. Continue POC for safety and reduced fall risk and gait training.      OBJECTIVE IMPAIRMENTS: Abnormal gait, decreased activity tolerance, decreased balance, decreased endurance, decreased mobility, difficulty walking, decreased strength, decreased safety awareness, hypomobility, impaired flexibility, impaired tone, impaired UE functional use, improper body mechanics, and postural dysfunction.     PLAN:  PT FREQUENCY: 2x/week  PT DURATION: 8 weeks  PLANNED INTERVENTIONS: 97164- PT Re-evaluation, 97110-Therapeutic exercises, 97530- Therapeutic activity, 97112- Neuromuscular re-education, 97535- Self Care, 16109- Manual therapy,  [phone]- Gait training, [phone]- Orthotic Fit/training, [phone]- Aquatic Therapy, Patient/Family education, Balance training, Stair training, Joint mobilization, Cryotherapy, and Moist heat  PLAN FOR NEXT SESSION: Work towards [employer], TM training, gait training with cane    Odai Wimmer M Jamas [place], SPT 07/23/2023, 4:18 PM

## 2023-07-25 ENCOUNTER — Ambulatory Visit: Payer: Medicare Other | Admitting: Physical Therapy

## 2023-07-25 DIAGNOSIS — R2689 Other abnormalities of gait and mobility: Secondary | ICD-10-CM

## 2023-07-25 DIAGNOSIS — M6281 Muscle weakness (generalized): Secondary | ICD-10-CM

## 2023-07-25 DIAGNOSIS — I6300 Cerebral infarction due to thrombosis of unspecified precerebral artery: Secondary | ICD-10-CM

## 2023-07-25 DIAGNOSIS — R278 Other lack of coordination: Secondary | ICD-10-CM

## 2023-07-25 DIAGNOSIS — R2681 Unsteadiness on feet: Secondary | ICD-10-CM

## 2023-07-25 NOTE — Therapy (Signed)
OUTPATIENT PHYSICAL THERAPY NEURO TREATMENT   Patient Name: Joe SCHMIDT Sr. MRN: [id number] DOB:Apr 21, 1951, 72 y.o., male Today's Date: 07/25/2023   PCP: Hoy Register, MD REFERRING PROVIDER: Hoy Register, MD  END OF SESSION:  PT End of Session - 07/25/23 1448     Visit Number 5    Number of Visits 13    Date for PT Re-Evaluation 08/21/23    Authorization Type [employer] Medicare    PT Start Time 1448    PT Stop Time 1530    PT Time Calculation (min) 42 min    Equipment Utilized During Treatment Gait belt    Activity Tolerance Patient tolerated treatment well               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 ([name])    Diabetes mellitus without complication ([name])    Dysphagia    post stroke   Hypertension    Left upper lobe pneumonia 02/15/2021   Stroke [facility])    Past Surgical History:  Procedure Laterality Date   IR ANGIO INTRA EXTRACRAN SEL COM CAROTID INNOMINATE UNI L MOD SED  03/10/2019   IR ANGIO VERTEBRAL SEL SUBCLAVIAN INNOMINATE BILAT MOD SED  03/10/2019   IR CT HEAD LTD  03/10/2019   IR PERCUTANEOUS ART THROMBECTOMY/INFUSION INTRACRANIAL INC DIAG ANGIO  03/10/2019   RADIOLOGY WITH ANESTHESIA N/A 03/10/2019   Procedure: IR WITH ANESTHESIA/CODE STROKE;  Surgeon: Radiologist, Medication, MD;  Location: [facility] OR;  Service: Radiology;  Laterality: N/A;   Patient Active Problem List   Diagnosis Date Noted   Obesity (BMI 30.0-34.9) 03/27/2022   Left hemiparesis ([name]) 12/06/2020   Benign essential HTN    Cognitive deficit, post-stroke    Controlled type 2 diabetes mellitus with hyperglycemia, without long-term current use of insulin ([name])    Vestibular schwannoma ([name]) 06/03/2019   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  Rationale for Evaluation and Treatment: Rehabilitation  SUBJECTIVE:  SUBJECTIVE STATEMENT: Patient presented to physical therapy with RW and accompanied by wife. Wife notes no new changes since last session  Pt accompanied by: significant other  PERTINENT HISTORY: Hx of multiple strokes.  PAIN:  Are you having pain? No  PRECAUTIONS: Fall  RED FLAGS: None   WEIGHT BEARING RESTRICTIONS: No  FALLS: Has patient fallen in last 6 months? No  LIVING ENVIRONMENT: Lives with: lives with their spouse Lives in: House/apartment Stairs: Yes: Internal: 12 steps; on left going up and has rails for the first 6 steps and then rest has rail on left hand side and External: 4 steps; can reach both Has following equipment at home: Dan Humphreys - 2 wheeled, Wheelchair (manual), and shower chair  PLOF: Needs assistance with ADLs, Needs assistance with homemaking, and Needs assistance with gait  PATIENT GOALS: Make the walking better  OBJECTIVE:  Note: Objective measures were completed at Evaluation unless otherwise noted.  COGNITION: Overall cognitive status: Impaired Pt unable to state the current year or his full address. Pt has aphasia.  LOWER EXTREMITY ROM:   Limited due to cognitive status  LOWER EXTREMITY MMT:  Limited due to cognitive status  BED MOBILITY:  Sit to supine Min A occaisonally;  supine to sit is I per pt report   GAIT: Gait pattern: decreased arm swing- Right, decreased arm swing- Left, decreased step length- Right, decreased step length- Left, decreased stance time- Right, decreased stance time- Left, decreased stride length, decreased hip/knee flexion- Right, decreased hip/knee flexion- Left, decreased ankle dorsiflexion- Right, decreased ankle dorsiflexion- Left, Right foot flat, Left foot flat, scissoring, decreased trunk rotation, narrow BOS, poor foot clearance- Right, and poor foot clearance- Left Distance walked: 115' total during the evaluation Assistive device utilized: Walker - 2 wheeled Level of assistance: SBA and CGA   FUNCTIONAL TESTS:  5 times sit to stand: 61 sec with bil UE support and very poor eccentric control even with VC Timed up and go (TUG): 99 sec with RW 10 meter walk test: 0.09 m/w with RW    TODAY'S TREATMENT:                                                                                                                              in // bars: Marching  x 2 laps Backwards walking x 2 laps Big steps stepping on targets x 4 laps Big side steps on to targets x 4 laps Amb with quad cane 3x20' v/cs for cane negotiation   PATIENT EDUCATION: Education details: see above Person educated: Patient and Spouse Education method: Explanation and Demonstration Education comprehension: verbalized understanding  HOME EXERCISE PROGRAM: Access Code: [email or web address] URL: [email or web address] Date: 07/10/2023 Prepared by: Lavone Nian  Exercises - Sit to Stand with Counter Support  - 1 x daily - 7 x weekly - 5 reps  GOALS: Goals reviewed with patient? Yes  SHORT TERM GOALS: Target date: 08/07/2023     Patient will demo compliance with HEP  for 50% of the time to improve self management of symptoms Baseline: issued 07/10/23 Goal status: INITIAL  2.  LONG TERM GOALS: Target date: 09/04/2023    Patient will demo <85 sec  with TUG to improve functional mobility at home. Baseline: 99 sec RW (07/10/23) Goal status: INITIAL  2.  Patient will demo gait speed of 0.15 m/s to improve functional mobility and decrease fall risk Baseline: 0.56m/s with RW (07/10/23) Goal status: INITIAL  3.  Patient will be able to perform 5 x sit to stand with use of bil UE in <50 sec to improve funcitonal strength. Baseline: 61 sec with with use of bil UE Goal status: INITIAL    ASSESSMENT:  CLINICAL IMPRESSION: Requires increased tactile cueing to correctly perform exercises to improve hip strength and weight shifting. Continuing to work on improving step length and weight shifting for gait. Continued to work on gait with quad cane.    OBJECTIVE IMPAIRMENTS: Abnormal gait, decreased activity tolerance, decreased balance, decreased endurance, decreased mobility, difficulty walking, decreased strength, decreased safety awareness, hypomobility, impaired flexibility, impaired tone, impaired UE functional use, improper body mechanics, and postural dysfunction.     PLAN:  PT FREQUENCY: 2x/week  PT DURATION: 8 weeks  PLANNED INTERVENTIONS: 97164- PT Re-evaluation, 97110-Therapeutic exercises, 97530- Therapeutic activity, 97112- Neuromuscular re-education, 97535- Self Care, 16109- Manual therapy, [phone]- Gait training, [phone]- Orthotic Fit/training, [phone]- Aquatic Therapy, Patient/Family education, Balance training, Stair training, Joint mobilization, Cryotherapy, and Moist heat  PLAN FOR NEXT SESSION: Work towards [employer], TM training, gait training with cane   Bob Eastwood April Ma L Jamond Neels, PT, DPT 07/25/2023, 2:48 PM

## 2023-07-30 ENCOUNTER — Ambulatory Visit: Payer: Medicare Other | Admitting: Physical Therapy

## 2023-07-30 DIAGNOSIS — R2681 Unsteadiness on feet: Secondary | ICD-10-CM

## 2023-07-30 DIAGNOSIS — R2689 Other abnormalities of gait and mobility: Secondary | ICD-10-CM

## 2023-07-30 DIAGNOSIS — M6281 Muscle weakness (generalized): Secondary | ICD-10-CM

## 2023-07-30 NOTE — Therapy (Signed)
OUTPATIENT PHYSICAL THERAPY NEURO TREATMENT   Patient Name: Joe Perry Sr. MRN: [id number] DOB:Nov 14, 1950, 72 y.o., male Today's Date: 07/30/2023   PCP: Hoy Register, MD REFERRING PROVIDER: Hoy Register, MD  END OF SESSION:  PT End of Session - 07/30/23 1355     Visit Number 6    Number of Visits 13    Date for PT Re-Evaluation 08/21/23    Authorization Type [employer] Medicare    PT Start Time 1400    PT Stop Time 1440    PT Time Calculation (min) 40 min    Equipment Utilized During Treatment Gait belt    Activity Tolerance Patient tolerated treatment well             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 ([name])    Diabetes mellitus without complication ([name])    Dysphagia    post stroke   Hypertension    Left upper lobe pneumonia 02/15/2021   Stroke [facility])    Past Surgical History:  Procedure Laterality Date   IR ANGIO INTRA EXTRACRAN SEL COM CAROTID INNOMINATE UNI L MOD SED  03/10/2019   IR ANGIO VERTEBRAL SEL SUBCLAVIAN INNOMINATE BILAT MOD SED  03/10/2019   IR CT HEAD LTD  03/10/2019   IR PERCUTANEOUS ART THROMBECTOMY/INFUSION INTRACRANIAL INC DIAG ANGIO  03/10/2019   RADIOLOGY WITH ANESTHESIA N/A 03/10/2019   Procedure: IR WITH ANESTHESIA/CODE STROKE;  Surgeon: Radiologist, Medication, MD;  Location: [facility] OR;  Service: Radiology;  Laterality: N/A;   Patient Active Problem List   Diagnosis Date Noted   Obesity (BMI 30.0-34.9) 03/27/2022   Left hemiparesis ([name]) 12/06/2020   Benign essential HTN    Cognitive deficit, post-stroke    Controlled type 2 diabetes mellitus with hyperglycemia, without long-term current use of insulin ([name])    Vestibular schwannoma ([name]) 06/03/2019   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  Rationale for Evaluation and Treatment: Rehabilitation  SUBJECTIVE:  SUBJECTIVE STATEMENT: Patient presented to physical therapy with RW and accompanied by wife. Wife notes no new changes since last session. Endorses no falls since last session.  Pt accompanied by: significant other  PERTINENT HISTORY: Hx of multiple strokes.  PAIN:  Are you having pain? No  PRECAUTIONS: Fall  RED FLAGS: None   WEIGHT BEARING RESTRICTIONS: No  FALLS: Has patient fallen in last 6 months? No  LIVING ENVIRONMENT: Lives with: lives with their spouse Lives in: House/apartment Stairs: Yes: Internal: 12 steps; on left going up and has rails for the first 6 steps and then rest has rail on left hand side and External: 4 steps; can reach both Has following equipment at home: Dan Humphreys - 2 wheeled, Wheelchair (manual), and shower chair  PLOF: Needs assistance with ADLs, Needs assistance with homemaking, and Needs assistance with gait  PATIENT GOALS: Make the walking better  OBJECTIVE:  Note: Objective measures were completed at Evaluation unless otherwise noted.  COGNITION: Overall cognitive status: Impaired Pt unable to state the current year or his full address. Pt has aphasia.  LOWER EXTREMITY ROM:   Limited due to cognitive status  LOWER EXTREMITY MMT:  Limited due to cognitive status  BED MOBILITY:   Sit to supine Min A occaisonally; supine to sit is I per pt report   GAIT: Gait pattern: decreased arm swing- Right, decreased arm swing- Left, decreased step length- Right, decreased step length- Left, decreased stance time- Right, decreased stance time- Left, decreased stride length, decreased hip/knee flexion- Right, decreased hip/knee flexion- Left, decreased ankle dorsiflexion- Right, decreased ankle dorsiflexion- Left, Right foot flat, Left foot flat, scissoring, decreased trunk rotation, narrow BOS, poor foot clearance- Right, and poor foot clearance- Left Distance walked: 115' total during the evaluation Assistive device utilized: Walker - 2 wheeled Level of assistance: SBA and CGA   FUNCTIONAL TESTS:  5 times sit to stand: 61 sec with bil UE support and very poor eccentric control even with VC Timed up and go (TUG): 99 sec with RW 10 meter walk test: 0.09 m/w with RW    TODAY'S TREATMENT:                                                                                                                              Sci fit LEs only L2 x 8 min maintaining > 80 spm encouraged for bigger stepping Step over 6" hurdles x2 round trip laps in // bars Side step over 6" hurdles x2 round trip laps in // bars Forward step up and overs on 4", airex and 6" step focusing on R and then L LE x3 round trips in // bars Amb with RW working on heel strike and bigger step lengths   PATIENT EDUCATION: Education details: see above Person educated: Patient and Spouse Education method: Medical illustrator Education comprehension: verbalized understanding  HOME EXERCISE PROGRAM: Access Code: [email or web address] URL: [email or web address] Date: 07/10/2023 Prepared by: Lavone Nian  Exercises - Sit  to Stand with Counter Support  - 1 x daily - 7 x weekly - 5 reps  GOALS: Goals reviewed with patient? Yes  SHORT TERM GOALS: Target date: 08/07/2023  Patient will demo compliance with  HEP for 50% of the time to improve self management of symptoms Baseline: issued 07/10/23 Goal status: INITIAL   LONG TERM GOALS: Target date: 09/04/2023    Patient will demo <85 sec with TUG to improve functional mobility at home. Baseline: 99 sec RW (07/10/23) Goal status: INITIAL  2.  Patient will demo gait speed of 0.15 m/s to improve functional mobility and decrease fall risk Baseline: 0.10m/s with RW (07/10/23) Goal status: INITIAL  3.  Patient will be able to perform 5 x sit to stand with use of bil UE in <50 sec to improve funcitonal strength. Baseline: 61 sec with with use of bil UE Goal status: INITIAL    ASSESSMENT:  CLINICAL IMPRESSION: Continues to require increased tactile and verbal cueing to correctly perform exercises to improve hip strength and weight shifting. Session focused on obstacle negotiation for improved step clearance. Pt with difficulty dorsiflexing ankle for heel strike to reduce shuffling his feet.    OBJECTIVE IMPAIRMENTS: Abnormal gait, decreased activity tolerance, decreased balance, decreased endurance, decreased mobility, difficulty walking, decreased strength, decreased safety awareness, hypomobility, impaired flexibility, impaired tone, impaired UE functional use, improper body mechanics, and postural dysfunction.     PLAN:  PT FREQUENCY: 2x/week  PT DURATION: 8 weeks  PLANNED INTERVENTIONS: 97164- PT Re-evaluation, 97110-Therapeutic exercises, 97530- Therapeutic activity, 97112- Neuromuscular re-education, 97535- Self Care, 78295- Manual therapy, [phone]- Gait training, [phone]- Orthotic Fit/training, [phone]- Aquatic Therapy, Patient/Family education, Balance training, Stair training, Joint mobilization, Cryotherapy, and Moist heat  PLAN FOR NEXT SESSION: Work towards [employer], TM training, gait training with cane   Khya Halls April Ma L Tarea Skillman, PT, DPT 07/30/2023, 1:55 PM

## 2023-08-01 ENCOUNTER — Other Ambulatory Visit: Payer: Self-pay | Admitting: Family Medicine

## 2023-08-01 ENCOUNTER — Ambulatory Visit: Payer: Medicare Other

## 2023-08-01 VITALS — BP 110/70

## 2023-08-01 DIAGNOSIS — R2689 Other abnormalities of gait and mobility: Secondary | ICD-10-CM

## 2023-08-01 DIAGNOSIS — M6281 Muscle weakness (generalized): Secondary | ICD-10-CM

## 2023-08-01 DIAGNOSIS — R2681 Unsteadiness on feet: Secondary | ICD-10-CM

## 2023-08-01 MED ORDER — MISC. DEVICES MISC: 0 refills | Status: DC

## 2023-08-01 NOTE — Therapy (Signed)
OUTPATIENT PHYSICAL THERAPY NEURO TREATMENT   Patient Name: Joe Perry Sr. MRN: [id number] DOB:12-16-1950, 72 y.o., male Today's Date: 08/01/2023   PCP: Hoy Register, MD REFERRING PROVIDER: Hoy Register, MD  END OF SESSION:  PT End of Session - 08/01/23 1352     Visit Number 7    Number of Visits 13    Date for PT Re-Evaluation 08/21/23    Authorization Type [employer] Medicare    PT Start Time 1400    PT Stop Time 1440    PT Time Calculation (min) 40 min    Equipment Utilized During Treatment Gait belt    Activity Tolerance Patient tolerated treatment well    Behavior During Therapy WFL for tasks assessed/performed             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 ([name])    Diabetes mellitus without complication ([name])    Dysphagia    post stroke   Hypertension    Left upper lobe pneumonia 02/15/2021   Stroke [facility])    Past Surgical History:  Procedure Laterality Date   IR ANGIO INTRA EXTRACRAN SEL COM CAROTID INNOMINATE UNI L MOD SED  03/10/2019   IR ANGIO VERTEBRAL SEL SUBCLAVIAN INNOMINATE BILAT MOD SED  03/10/2019   IR CT HEAD LTD  03/10/2019   IR PERCUTANEOUS ART THROMBECTOMY/INFUSION INTRACRANIAL INC DIAG ANGIO  03/10/2019   RADIOLOGY WITH ANESTHESIA N/A 03/10/2019   Procedure: IR WITH ANESTHESIA/CODE STROKE;  Surgeon: Radiologist, Medication, MD;  Location: [facility] OR;  Service: Radiology;  Laterality: N/A;   Patient Active Problem List   Diagnosis Date Noted   Obesity (BMI 30.0-34.9) 03/27/2022   Left hemiparesis ([name]) 12/06/2020   Benign essential HTN    Cognitive deficit, post-stroke    Controlled type 2 diabetes mellitus with hyperglycemia, without long-term current use of insulin ([name])    Vestibular schwannoma ([name]) 06/03/2019    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   Dyslipidemia, goal LDL below 70 03/12/2019   Dysphagia due to recent stroke 03/12/2019   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, d/t large vessel dz 03/10/2019   Occlusion of right posterior communicating artery 03/10/2019   Gait disturbance, post-stroke    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    ONSET DATE: 07/03/2023  REFERRING DIAG:  I69.30 (ICD-10-CM) - History of CVA with residual deficit    THERAPY DIAG:  Other abnormalities of gait and mobility  Unsteadiness on feet  Muscle weakness (generalized)  Rationale for Evaluation and Treatment: Rehabilitation  SUBJECTIVE:  SUBJECTIVE STATEMENT: Patient presented to physical therapy with RW and accompanied by wife. Wife notes no new changes since last session. Endorses no falls since last session.  Pt accompanied by: significant other  PERTINENT HISTORY: Hx of multiple strokes.  PAIN:  Are you having pain? No  PRECAUTIONS: Fall  RED FLAGS: None   WEIGHT BEARING RESTRICTIONS: No  FALLS: Has patient fallen in last 6 months? No  LIVING ENVIRONMENT: Lives with: lives with their spouse Lives in: House/apartment Stairs: Yes: Internal: 12 steps; on left going up and has rails for the first 6 steps and then rest has rail on left hand side and External: 4 steps; can reach both Has following equipment at home: Dan Humphreys - 2 wheeled, Wheelchair (manual), and shower chair  PLOF: Needs assistance with ADLs, Needs assistance with homemaking, and Needs assistance with gait  PATIENT GOALS: Make the walking better  OBJECTIVE:  Note: Objective measures were completed at Evaluation unless otherwise noted.  COGNITION: Overall cognitive status: Impaired Pt unable to state the current year or his full  address. Pt has aphasia.  LOWER EXTREMITY ROM:   Limited due to cognitive status  LOWER EXTREMITY MMT:  Limited due to cognitive status  BED MOBILITY:  Sit to supine Min A occaisonally; supine to sit is I per pt report   GAIT: Gait pattern: decreased arm swing- Right, decreased arm swing- Left, decreased step length- Right, decreased step length- Left, decreased stance time- Right, decreased stance time- Left, decreased stride length, decreased hip/knee flexion- Right, decreased hip/knee flexion- Left, decreased ankle dorsiflexion- Right, decreased ankle dorsiflexion- Left, Right foot flat, Left foot flat, scissoring, decreased trunk rotation, narrow BOS, poor foot clearance- Right, and poor foot clearance- Left Distance walked: 115' total during the evaluation Assistive device utilized: Walker - 2 wheeled Level of assistance: SBA and CGA   FUNCTIONAL TESTS:  5 times sit to stand: 61 sec with bil UE support and very poor eccentric control even with VC (eval); 51 sec (08/01/23) Timed up and go (TUG): 99 sec with RW (eval); 135 sec with RW (08/01/23) 10 meter walk test: 0.09 m/w with RW (eval); 0.08 m/s with RW (08/01/23)    TODAY'S TREATMENT:     Vitals:   08/01/23 1436 08/01/23 1437  BP: (!) 93/46 110/70   First BP was measured over sleeves so it was not accurate, measured 2nd time on bare skin and it [facility].                                                                                                                            5x Sit to stand: 3 x 5,  First trial: poor eccentric control when sitting down, verbal and tactile cues required for hand placement (51 sec) Second and third trial: L UE on walker and R UE on mat table: pt educated not to remove hand from the walker; pt tends to tip the walker to left with excessive push on L UE  and still demo poor eccentric control (slightly improved) withR UE on table when sitting back down. (Time during third trial: 54 sec)  TUG and gait  speed performed (see above under functional tests)  Gait training: 2 x 115' with PT pushing the walker from behind to improve cadence during ambulation, VC provided for increased step length and better foot clearance on L foot.   Pt education: Progress towards functional tests and goals reviewed with patient and wife. We discussed that he has made small progress with his sit to stand but sitting down is still very poor with lack of control and safety awareness. We discussed that he needs to be better with practicing sit sit to stand and walking with big steps at home for improved compliance and also to see significant progress in his function.   Spouse requested new walker order. Message sent to Dr. Alvis Lemmings for new walker order. (Spouse reports that his last walker was ordered in 2020). Current walker definitely has some wear and has little bit more play than it should which can impact safety with further wear and tear  PATIENT EDUCATION: Education details: see above Person educated: Patient and Spouse Education method: Medical illustrator Education comprehension: verbalized understanding  HOME EXERCISE PROGRAM: Access Code: [email or web address] URL: [email or web address] Date: 07/10/2023 Prepared by: Lavone Nian  Exercises - Sit to Stand with Counter Support  - 1 x daily - 7 x weekly - 5 reps  GOALS: Goals reviewed with patient? Yes  SHORT TERM GOALS: Target date: 08/07/2023  Patient will demo compliance with HEP for 50% of the time to improve self management of symptoms Baseline: issued 07/10/23; spouse reports no compliance with walking at home. Goal status: Not met   LONG TERM GOALS: Target date: 09/04/2023    Patient will demo <85 sec with TUG to improve functional mobility at home. Baseline: 99 sec RW (07/10/23) Goal status: Continue  2.  Patient will demo gait speed of 0.15 m/s to improve functional mobility and decrease fall risk Baseline: 0.69m/s with RW  (07/10/23); 0.08 m/s (08/01/23) Goal status: IContinue  3.  Patient will be able to perform 5 x sit to stand with use of bil UE in <50 sec to improve funcitonal strength. Baseline: 61 sec with with use of bil UE; 51 sec with use of bil UE Goal status: Progressing continue    ASSESSMENT:  CLINICAL IMPRESSION: Progress noted towards LTG#3 (5x sit to stand). Regression of time noted for TUG goal (LTG#1).    OBJECTIVE IMPAIRMENTS: Abnormal gait, decreased activity tolerance, decreased balance, decreased endurance, decreased mobility, difficulty walking, decreased strength, decreased safety awareness, hypomobility, impaired flexibility, impaired tone, impaired UE functional use, improper body mechanics, and postural dysfunction.     PLAN:  PT FREQUENCY: 2x/week  PT DURATION: 8 weeks  PLANNED INTERVENTIONS: 97164- PT Re-evaluation, 97110-Therapeutic exercises, 97530- Therapeutic activity, 97112- Neuromuscular re-education, 97535- Self Care, 34742- Manual therapy, [phone]- Gait training, [phone]- Orthotic Fit/training, [phone]- Aquatic Therapy, Patient/Family education, Balance training, Stair training, Joint mobilization, Cryotherapy, and Moist heat  PLAN FOR NEXT SESSION: Work towards [employer], TM training, gait training with cane   Ileana Ladd, PT, DPT 08/01/2023, 2:43 PM

## 2023-08-06 ENCOUNTER — Ambulatory Visit: Payer: Medicare Other | Attending: Family Medicine

## 2023-08-06 DIAGNOSIS — I69319 Unspecified symptoms and signs involving cognitive functions following cerebral infarction: Secondary | ICD-10-CM | POA: Diagnosis present

## 2023-08-06 DIAGNOSIS — R2689 Other abnormalities of gait and mobility: Secondary | ICD-10-CM | POA: Diagnosis present

## 2023-08-06 DIAGNOSIS — R278 Other lack of coordination: Secondary | ICD-10-CM | POA: Insufficient documentation

## 2023-08-06 DIAGNOSIS — M6281 Muscle weakness (generalized): Secondary | ICD-10-CM | POA: Diagnosis present

## 2023-08-06 DIAGNOSIS — I6300 Cerebral infarction due to thrombosis of unspecified precerebral artery: Secondary | ICD-10-CM | POA: Insufficient documentation

## 2023-08-06 DIAGNOSIS — R2681 Unsteadiness on feet: Secondary | ICD-10-CM | POA: Insufficient documentation

## 2023-08-06 DIAGNOSIS — R1310 Dysphagia, unspecified: Secondary | ICD-10-CM | POA: Diagnosis present

## 2023-08-06 NOTE — Therapy (Signed)
OUTPATIENT PHYSICAL THERAPY NEURO TREATMENT   Patient Name: Joe UMBERGER Sr. MRN: [id number] DOB:02-19-1951, 72 y.o., male Today's Date: 08/06/2023   PCP: Hoy Register, MD REFERRING PROVIDER: Hoy Register, MD  END OF SESSION:  PT End of Session - 08/06/23 1448     Visit Number 8    Number of Visits 13    Date for PT Re-Evaluation 08/21/23    Authorization Type [employer] Medicare    PT Start Time 1448    PT Stop Time 1532    PT Time Calculation (min) 44 min    Equipment Utilized During Treatment Gait belt    Activity Tolerance Patient tolerated treatment well    Behavior During Therapy WFL for tasks assessed/performed             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 ([name])    Diabetes mellitus without complication ([name])    Dysphagia    post stroke   Hypertension    Left upper lobe pneumonia 02/15/2021   Stroke [facility])    Past Surgical History:  Procedure Laterality Date   IR ANGIO INTRA EXTRACRAN SEL COM CAROTID INNOMINATE UNI L MOD SED  03/10/2019   IR ANGIO VERTEBRAL SEL SUBCLAVIAN INNOMINATE BILAT MOD SED  03/10/2019   IR CT HEAD LTD  03/10/2019   IR PERCUTANEOUS ART THROMBECTOMY/INFUSION INTRACRANIAL INC DIAG ANGIO  03/10/2019   RADIOLOGY WITH ANESTHESIA N/A 03/10/2019   Procedure: IR WITH ANESTHESIA/CODE STROKE;  Surgeon: Radiologist, Medication, MD;  Location: [facility] OR;  Service: Radiology;  Laterality: N/A;   Patient Active Problem List   Diagnosis Date Noted   Obesity (BMI 30.0-34.9) 03/27/2022   Left hemiparesis ([name]) 12/06/2020   Benign essential HTN    Cognitive deficit, post-stroke    Controlled type 2 diabetes mellitus with hyperglycemia, without long-term current use of insulin ([name])    Vestibular schwannoma ([name]) 06/03/2019    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   Dyslipidemia, goal LDL below 70 03/12/2019   Dysphagia due to recent stroke 03/12/2019   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, d/t large vessel dz 03/10/2019   Occlusion of right posterior communicating artery 03/10/2019   Gait disturbance, post-stroke    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    ONSET DATE: 07/03/2023  REFERRING DIAG:  I69.30 (ICD-10-CM) - History of CVA with residual deficit    THERAPY DIAG:  Other abnormalities of gait and mobility  Unsteadiness on feet  Muscle weakness (generalized)  Rationale for Evaluation and Treatment: Rehabilitation  SUBJECTIVE:  SUBJECTIVE STATEMENT: Patient presented to physical therapy with RW and accompanied by wife. Wife stated things have been going well.   Pt accompanied by: significant other  PERTINENT HISTORY: Hx of multiple strokes.  PAIN:  Are you having pain? No  PRECAUTIONS: Fall  RED FLAGS: None   WEIGHT BEARING RESTRICTIONS: No  FALLS: Has patient fallen in last 6 months? No  LIVING ENVIRONMENT: Lives with: lives with their spouse Lives in: House/apartment Stairs: Yes: Internal: 12 steps; on left going up and has rails for the first 6 steps and then rest has rail on left hand side and External: 4 steps; can reach both Has following equipment at home: Dan Humphreys - 2 wheeled, Wheelchair (manual), and shower chair  PLOF: Needs assistance with ADLs, Needs assistance with homemaking, and Needs assistance with gait  PATIENT GOALS: Make the walking better  OBJECTIVE:  Note: Objective measures were completed at Evaluation unless otherwise noted.  COGNITION: Overall cognitive status: Impaired Pt unable to state the current year or his full address. Pt has aphasia.  LOWER EXTREMITY ROM:    Limited due to cognitive status  LOWER EXTREMITY MMT:  Limited due to cognitive status  BED MOBILITY:  Sit to supine Min A occaisonally; supine to sit is I per pt report   GAIT: Gait pattern: decreased arm swing- Right, decreased arm swing- Left, decreased step length- Right, decreased step length- Left, decreased stance time- Right, decreased stance time- Left, decreased stride length, decreased hip/knee flexion- Right, decreased hip/knee flexion- Left, decreased ankle dorsiflexion- Right, decreased ankle dorsiflexion- Left, Right foot flat, Left foot flat, scissoring, decreased trunk rotation, narrow BOS, poor foot clearance- Right, and poor foot clearance- Left Distance walked: 115' total during the evaluation Assistive device utilized: Walker - 2 wheeled Level of assistance: SBA and CGA   FUNCTIONAL TESTS:  5 times sit to stand: 61 sec with bil UE support and very poor eccentric control even with VC (eval); 51 sec (08/01/23) Timed up and go (TUG): 99 sec with RW (eval); 135 sec with RW (08/01/23) 10 meter walk test: 0.09 m/w with RW (eval); 0.08 m/s with RW (08/01/23)    TODAY'S TREATMENT:     There were no vitals filed for this visit.  Sci Fit: - level 2.2 - 8 min   Mat table: - STS, bilat UE support  - hip flex up and over 5 lb KB   Gait:  - 230' w/ RW  - SPT pulling walker, dual task of pushing walker and ambulating improving with verbal cueing   PATIENT EDUCATION: Education details: see above Person educated: Patient and Spouse Education method: Medical illustrator Education comprehension: verbalized understanding  HOME EXERCISE PROGRAM: Access Code: [email or web address] URL: [email or web address] Date: 07/10/2023 Prepared by: Lavone Nian  Exercises - Sit to Stand with Counter Support  - 1 x daily - 7 x weekly - 5 reps  GOALS: Goals reviewed with patient? Yes  SHORT TERM GOALS: Target date: 08/07/2023  Patient will demo compliance with  HEP for 50% of the time to improve self management of symptoms Baseline: issued 07/10/23; spouse reports no compliance with walking at home. Goal status: Not met   LONG TERM GOALS: Target date: 09/04/2023    Patient will demo <85 sec with TUG to improve functional mobility at home. Baseline: 99 sec RW (07/10/23) Goal status: Continue  2.  Patient will demo gait speed of 0.15 m/s to improve functional mobility and decrease fall risk Baseline: 0.8m/s with RW (07/10/23); 0.08 m/s (08/01/23) Goal  status: IContinue  3.  Patient will be able to perform 5 x sit to stand with use of bil UE in <50 sec to improve funcitonal strength. Baseline: 61 sec with with use of bil UE; 51 sec with use of bil UE Goal status: Progressing continue    ASSESSMENT:  CLINICAL IMPRESSION: Patient seen for skilled physical therapy session with emphasis on LE strength and gait training. Started on SciFit for reciprocal, coordinated movements and endurance. Required bilat UE support for STS activity and verbal cueing to control eccentric portion. Required tactile cueing to use hip flexors and not knee extensors for seated activity. During laps around the gym, SPT was pulling walker to increase gait speed and verbal cueing to take bigger steps with L LE. Some carry over with two straightaways with patient pushing the walker himself with increased gait speed. Continue POC.    OBJECTIVE IMPAIRMENTS: Abnormal gait, decreased activity tolerance, decreased balance, decreased endurance, decreased mobility, difficulty walking, decreased strength, decreased safety awareness, hypomobility, impaired flexibility, impaired tone, impaired UE functional use, improper body mechanics, and postural dysfunction.     PLAN:  PT FREQUENCY: 2x/week  PT DURATION: 8 weeks  PLANNED INTERVENTIONS: 97164- PT Re-evaluation, 97110-Therapeutic exercises, 97530- Therapeutic activity, 97112- Neuromuscular re-education, 97535- Self Care, 65784-  Manual therapy, [phone]- Gait training, [phone]- Orthotic Fit/training, [phone]- Aquatic Therapy, Patient/Family education, Balance training, Stair training, Joint mobilization, Cryotherapy, and Moist heat  PLAN FOR NEXT SESSION: Work towards [employer], TM training, gait training with cane   Lavella Myren M Ryden Wainer, SPT  08/06/2023, 3:37 PM

## 2023-08-08 ENCOUNTER — Ambulatory Visit: Payer: Medicare Other

## 2023-08-08 VITALS — BP 145/89 | HR 80

## 2023-08-08 DIAGNOSIS — R2681 Unsteadiness on feet: Secondary | ICD-10-CM

## 2023-08-08 DIAGNOSIS — R278 Other lack of coordination: Secondary | ICD-10-CM

## 2023-08-08 DIAGNOSIS — I6300 Cerebral infarction due to thrombosis of unspecified precerebral artery: Secondary | ICD-10-CM

## 2023-08-08 DIAGNOSIS — M6281 Muscle weakness (generalized): Secondary | ICD-10-CM

## 2023-08-08 DIAGNOSIS — R2689 Other abnormalities of gait and mobility: Secondary | ICD-10-CM | POA: Diagnosis not present

## 2023-08-08 DIAGNOSIS — R1310 Dysphagia, unspecified: Secondary | ICD-10-CM

## 2023-08-08 DIAGNOSIS — I69319 Unspecified symptoms and signs involving cognitive functions following cerebral infarction: Secondary | ICD-10-CM

## 2023-08-08 NOTE — Therapy (Signed)
OUTPATIENT PHYSICAL THERAPY NEURO TREATMENT   Patient Name: Joe JOENS Sr. MRN: [id number] DOB:[birth year], 72 y.o., male Today's Date: 08/08/2023   PCP: Hoy Register, MD REFERRING PROVIDER: Hoy Register, MD  END OF SESSION:  PT End of Session - 08/08/23 1453     Visit Number 9    Number of Visits 13    Date for PT Re-Evaluation 08/21/23    Authorization Type [employer] Medicare    PT Start Time 1446    PT Stop Time 1520    PT Time Calculation (min) 34 min    Equipment Utilized During Treatment Gait belt    Activity Tolerance Patient tolerated treatment well    Behavior During Therapy WFL for tasks assessed/performed             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 ([name])    Diabetes mellitus without complication ([name])    Dysphagia    post stroke   Hypertension    Left upper lobe pneumonia 02/15/2021   Stroke [facility])    Past Surgical History:  Procedure Laterality Date   IR ANGIO INTRA EXTRACRAN SEL COM CAROTID INNOMINATE UNI L MOD SED  03/10/2019   IR ANGIO VERTEBRAL SEL SUBCLAVIAN INNOMINATE BILAT MOD SED  03/10/2019   IR CT HEAD LTD  03/10/2019   IR PERCUTANEOUS ART THROMBECTOMY/INFUSION INTRACRANIAL INC DIAG ANGIO  03/10/2019   RADIOLOGY WITH ANESTHESIA N/A 03/10/2019   Procedure: IR WITH ANESTHESIA/CODE STROKE;  Surgeon: Radiologist, Medication, MD;  Location: [facility] OR;  Service: Radiology;  Laterality: N/A;   Patient Active Problem List   Diagnosis Date Noted   Obesity (BMI 30.0-34.9) 03/27/2022   Left hemiparesis ([name]) 12/06/2020   Benign essential HTN    Cognitive deficit, post-stroke    Controlled type 2 diabetes mellitus with hyperglycemia, without long-term current use of insulin ([name])    Vestibular schwannoma ([name]) 06/03/2019    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   Dyslipidemia, goal LDL below 70 03/12/2019   Dysphagia due to recent stroke 03/12/2019   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, d/t large vessel dz 03/10/2019   Occlusion of right posterior communicating artery 03/10/2019   Gait disturbance, post-stroke    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    ONSET DATE: 07/03/2023  REFERRING DIAG:  I69.30 (ICD-10-CM) - History of CVA with residual deficit    THERAPY DIAG:  Other abnormalities of gait and mobility  Unsteadiness on feet  Other lack of coordination  Cognitive deficit, post-stroke  Muscle weakness (generalized)  Dysphagia, unspecified type  Cerebrovascular accident (CVA) due to thrombosis of precerebral artery ([name])  Rationale for Evaluation and Treatment: Rehabilitation  SUBJECTIVE:  SUBJECTIVE STATEMENT: Patient presented to physical therapy with RW and accompanied by wife. Wife denied any falls and stated he has been walking more at home.   Pt accompanied by: significant other  PERTINENT HISTORY: Hx of multiple strokes.  PAIN:  Are you having pain? No  PRECAUTIONS: Fall  RED FLAGS: None   WEIGHT BEARING RESTRICTIONS: No  FALLS: Has patient fallen in last 6 months? No  LIVING ENVIRONMENT: Lives with: lives with their spouse Lives in: House/apartment Stairs: Yes: Internal: 12 steps; on left going up and has rails for the first 6 steps and then rest has rail on left hand side and External: 4 steps; can reach both Has following equipment at home: Dan Humphreys - 2 wheeled, Wheelchair (manual), and shower chair  PLOF: Needs assistance with ADLs, Needs assistance with homemaking, and Needs assistance with gait  PATIENT GOALS: Make the walking better  OBJECTIVE:  Note: Objective measures  were completed at Evaluation unless otherwise noted.  COGNITION: Overall cognitive status: Impaired Pt unable to state the current year or his full address. Pt has aphasia.  LOWER EXTREMITY ROM:   Limited due to cognitive status  LOWER EXTREMITY MMT:  Limited due to cognitive status  BED MOBILITY:  Sit to supine Min A occaisonally; supine to sit is I per pt report   GAIT: Gait pattern: decreased arm swing- Right, decreased arm swing- Left, decreased step length- Right, decreased step length- Left, decreased stance time- Right, decreased stance time- Left, decreased stride length, decreased hip/knee flexion- Right, decreased hip/knee flexion- Left, decreased ankle dorsiflexion- Right, decreased ankle dorsiflexion- Left, Right foot flat, Left foot flat, scissoring, decreased trunk rotation, narrow BOS, poor foot clearance- Right, and poor foot clearance- Left Distance walked: 115' total during the evaluation Assistive device utilized: Walker - 2 wheeled Level of assistance: SBA and CGA   FUNCTIONAL TESTS:  5 times sit to stand: 61 sec with bil UE support and very poor eccentric control even with VC (eval); 51 sec (08/01/23) Timed up and go (TUG): 99 sec with RW (eval); 135 sec with RW (08/01/23) 10 meter walk test: 0.09 m/w with RW (eval); 0.08 m/s with RW (08/01/23)    TODAY'S TREATMENT:     Vitals:   08/08/23 1453  BP: (!) 145/89  Pulse: 80   Gait  - 230' w/ RW, PT pulling walker and then letting go for increased gait speed x2    - .45m/s - .15m/s     PATIENT EDUCATION: Education details: see above Person educated: Patient and Spouse Education method: Explanation and Demonstration Education comprehension: verbalized understanding  HOME EXERCISE PROGRAM: Access Code: [email or web address] URL: [email or web address] Date: 07/10/2023 Prepared by: Lavone Nian  Exercises - Sit to Stand with Counter Support  - 1 x daily - 7 x weekly - 5 reps  GOALS: Goals  reviewed with patient? Yes  SHORT TERM GOALS: Target date: 08/07/2023  Patient will demo compliance with HEP for 50% of the time to improve self management of symptoms Baseline: issued 07/10/23; spouse reports no compliance with walking at home. Goal status: Not met   LONG TERM GOALS: Target date: 09/04/2023    Patient will demo <85 sec with TUG to improve functional mobility at home. Baseline: 99 sec RW (07/10/23) Goal status: Continue  2.  Patient will demo gait speed of 0.15 m/s to improve functional mobility and decrease fall risk Baseline: 0.54m/s with RW (07/10/23); 0.08 m/s (08/01/23) Goal status: IContinue  3.  Patient will be able to perform 5  x sit to stand with use of bil UE in <50 sec to improve funcitonal strength. Baseline: 61 sec with with use of bil UE; 51 sec with use of bil UE Goal status: Progressing continue    ASSESSMENT:  CLINICAL IMPRESSION: Patient seen for skilled physical therapy session with emphasis on gait training. Patient did really well today and this week in comparison to previous weeks. PT standing in front of pt. Encouraging him to push walker forward with some carry over. Patient also verbalized ~ 5 words today and smiled following success of .   Gait speed improved from initial eval on 07/10/23 of 0.51m/s to today was 0.27m/s.   Patient would benefit from continued gait training w/ dual tasking of pushing RW. Continue POC.    OBJECTIVE IMPAIRMENTS: Abnormal gait, decreased activity tolerance, decreased balance, decreased endurance, decreased mobility, difficulty walking, decreased strength, decreased safety awareness, hypomobility, impaired flexibility, impaired tone, impaired UE functional use, improper body mechanics, and postural dysfunction.     PLAN:  PT FREQUENCY: 2x/week  PT DURATION: 8 weeks  PLANNED INTERVENTIONS: 97164- PT Re-evaluation, 97110-Therapeutic exercises, 97530- Therapeutic activity, 97112- Neuromuscular  re-education, 97535- Self Care, 16109- Manual therapy, [phone]- Gait training, [phone]- Orthotic Fit/training, [phone]- Aquatic Therapy, Patient/Family education, Balance training, Stair training, Joint mobilization, Cryotherapy, and Moist heat  PLAN FOR NEXT SESSION: Gait training    Dejai Schubach M Giovanny Dugal, SPT  08/08/2023, 3:25 PM

## 2023-08-13 ENCOUNTER — Other Ambulatory Visit: Payer: Self-pay | Admitting: Critical Care Medicine

## 2023-08-13 DIAGNOSIS — E1159 Type 2 diabetes mellitus with other circulatory complications: Secondary | ICD-10-CM

## 2023-08-15 ENCOUNTER — Ambulatory Visit: Payer: Medicare Other

## 2023-08-15 DIAGNOSIS — R2681 Unsteadiness on feet: Secondary | ICD-10-CM

## 2023-08-15 DIAGNOSIS — M6281 Muscle weakness (generalized): Secondary | ICD-10-CM

## 2023-08-15 DIAGNOSIS — R2689 Other abnormalities of gait and mobility: Secondary | ICD-10-CM | POA: Diagnosis not present

## 2023-08-15 DIAGNOSIS — R278 Other lack of coordination: Secondary | ICD-10-CM

## 2023-08-15 NOTE — Therapy (Signed)
OUTPATIENT PHYSICAL THERAPY NEURO TREATMENT   Patient Name: Joe Perry. MRN: [id number] DOB:September 10, 1950, 72 y.o., male Today's Date: 08/15/2023   PCP: Hoy Register, MD REFERRING PROVIDER: Hoy Register, MD  END OF SESSION:  PT End of Session - 08/15/23 1355     Visit Number 10    Number of Visits 13    Date for PT Re-Evaluation 08/21/23    Authorization Type [employer] Medicare    PT Start Time 1400    PT Stop Time 1446    PT Time Calculation (min) 46 min             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 ([name])    Diabetes mellitus without complication ([name])    Dysphagia    post stroke   Hypertension    Left upper lobe pneumonia 02/15/2021   Stroke [facility])    Past Surgical History:  Procedure Laterality Date   IR ANGIO INTRA EXTRACRAN SEL COM CAROTID INNOMINATE UNI L MOD SED  03/10/2019   IR ANGIO VERTEBRAL SEL SUBCLAVIAN INNOMINATE BILAT MOD SED  03/10/2019   IR CT HEAD LTD  03/10/2019   IR PERCUTANEOUS ART THROMBECTOMY/INFUSION INTRACRANIAL INC DIAG ANGIO  03/10/2019   RADIOLOGY WITH ANESTHESIA N/A 03/10/2019   Procedure: IR WITH ANESTHESIA/CODE STROKE;  Surgeon: Radiologist, Medication, MD;  Location: [facility] OR;  Service: Radiology;  Laterality: N/A;   Patient Active Problem List   Diagnosis Date Noted   Obesity (BMI 30.0-34.9) 03/27/2022   Left hemiparesis ([name]) 12/06/2020   Benign essential HTN    Cognitive deficit, post-stroke    Controlled type 2 diabetes mellitus with hyperglycemia, without long-term current use of insulin ([name])    Vestibular schwannoma ([name]) 06/03/2019   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   Dyslipidemia, goal LDL below 70 03/12/2019   Dysphagia due to recent stroke 03/12/2019   Stroke (cerebrum) ([name]) - R PCA s/p mechanical  thrombectomy, d/t large vessel dz 03/10/2019   Occlusion of right posterior communicating artery 03/10/2019   Gait disturbance, post-stroke    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    ONSET DATE: 07/03/2023  REFERRING DIAG:  I69.30 (ICD-10-CM) - History of CVA with residual deficit    THERAPY DIAG:  Other abnormalities of gait and mobility  Unsteadiness on feet  Other lack of coordination  Muscle weakness (generalized)  Rationale for Evaluation and Treatment: Rehabilitation  SUBJECTIVE:  SUBJECTIVE STATEMENT: Patient presented to physical therapy with RW and accompanied by wife. Wife denied falls. Said he will do exercises at home sometimes.    Pt accompanied by: significant other  PERTINENT HISTORY: Hx of multiple strokes.  PAIN:  Are you having pain? No  PRECAUTIONS: Fall  RED FLAGS: None   WEIGHT BEARING RESTRICTIONS: No  FALLS: Has patient fallen in last 6 months? No  LIVING ENVIRONMENT: Lives with: lives with their spouse Lives in: House/apartment Stairs: Yes: Internal: 12 steps; on left going up and has rails for the first 6 steps and then rest has rail on left hand side and External: 4 steps; can reach both Has following equipment at home: Dan Humphreys - 2 wheeled, Wheelchair (manual), and shower chair  PLOF: Needs assistance with ADLs, Needs assistance with homemaking, and Needs assistance with gait  PATIENT GOALS: Make the walking better  OBJECTIVE:  Note: Objective measures were completed at Evaluation unless otherwise noted.  COGNITION: Overall cognitive status: Impaired Pt unable to state the current year or his full address. Pt has aphasia.  LOWER EXTREMITY ROM:   Limited due to cognitive status  LOWER EXTREMITY MMT:  Limited due to cognitive status  BED MOBILITY:  Sit to supine  Min A occaisonally; supine to sit is I per pt report   GAIT: Gait pattern: decreased arm swing- Right, decreased arm swing- Left, decreased step length- Right, decreased step length- Left, decreased stance time- Right, decreased stance time- Left, decreased stride length, decreased hip/knee flexion- Right, decreased hip/knee flexion- Left, decreased ankle dorsiflexion- Right, decreased ankle dorsiflexion- Left, Right foot flat, Left foot flat, scissoring, decreased trunk rotation, narrow BOS, poor foot clearance- Right, and poor foot clearance- Left Distance walked: 115' total during the evaluation Assistive device utilized: Walker - 2 wheeled Level of assistance: SBA and CGA   FUNCTIONAL TESTS:  5 times sit to stand: 61 sec with bil UE support and very poor eccentric control even with VC (eval); 51 sec (08/01/23) Timed up and go (TUG): 99 sec with RW (eval); 135 sec with RW (08/01/23) 10 meter walk test: 0.09 m/w with RW (eval); 0.08 m/s with RW (08/01/23)    TODAY'S TREATMENT:     Sit to Stands - on airex pad w/ zoom ball  - CGA, pt. able to do STS from airex without UE support when given object to hold in bilat UE   Gait  - 115' - SPT pulling walker for external cue and letting go with verbal encouragement to dual task  - 230' - SPT pulling walker for external cue and letting go with verbal encouragement to dual task  - 115' - verbal cue, dual task of walker and picking up L LE  - 115' - SPT pulling walker for external cue and letting go with verbal encouragement to dual task    PATIENT EDUCATION: Education details: see above Person educated: Patient and Spouse Education method: Explanation and Demonstration Education comprehension: verbalized understanding  HOME EXERCISE PROGRAM: Access Code: [email or web address] URL: [email or web address] Date: 07/10/2023 Prepared by: Lavone Nian  Exercises - Sit to Stand with Counter Support  - 1 x daily - 7 x weekly - 5  reps  GOALS: Goals reviewed with patient? Yes  SHORT TERM GOALS: Target date: 08/07/2023  Patient will demo compliance with HEP for 50% of the time to improve self management of symptoms Baseline: issued 07/10/23; spouse reports no compliance with walking at home. Goal status: Not met   LONG TERM GOALS: Target date: 09/04/2023  Patient will demo <85 sec with TUG to improve functional mobility at home. Baseline: 99 sec RW (07/10/23) Goal status: Continue  2.  Patient will demo gait speed of 0.15 m/s to improve functional mobility and decrease fall risk Baseline: 0.63m/s with RW (07/10/23); 0.08 m/s (08/01/23) (08/08/23 0.40m/s)  Goal status: IContinue  3.  Patient will be able to perform 5 x sit to stand with use of bil UE in <50 sec to improve funcitonal strength. Baseline: 61 sec with with use of bil UE; 51 sec with use of bil UE Goal status: Progressing continue    ASSESSMENT:  CLINICAL IMPRESSION: Patient seen for skilled physical therapy session with emphasis on functional LE strength and gait training. Patient did well with coordinating STS w/ zoom ball. He was able to do STS from airex pad without UE support, CGA, demonstrating improved bilat LE functional strength while also coordinating dual task of UE motion for zoom ball. Patient seemed more tired today which was noticeable with his gait speed, but some carryover with gait training is still noticeable with continuous verbal encouragement. Continue POC.   OBJECTIVE IMPAIRMENTS: Abnormal gait, decreased activity tolerance, decreased balance, decreased endurance, decreased mobility, difficulty walking, decreased strength, decreased safety awareness, hypomobility, impaired flexibility, impaired tone, impaired UE functional use, improper body mechanics, and postural dysfunction.     PLAN:  PT FREQUENCY: 2x/week  PT DURATION: 8 weeks  PLANNED INTERVENTIONS: 97164- PT Re-evaluation, 97110-Therapeutic exercises, 97530-  Therapeutic activity, 97112- Neuromuscular re-education, 97535- Self Care, 16109- Manual therapy, [phone]- Gait training, [phone]- Orthotic Fit/training, [phone]- Aquatic Therapy, Patient/Family education, Balance training, Stair training, Joint mobilization, Cryotherapy, and Moist heat  PLAN FOR NEXT SESSION: Gait training    Markese Bloxham M Paxtyn Wisdom, SPT  08/15/2023, 3:40 PM

## 2023-08-20 ENCOUNTER — Ambulatory Visit: Payer: Medicare Other

## 2023-08-20 DIAGNOSIS — M6281 Muscle weakness (generalized): Secondary | ICD-10-CM

## 2023-08-20 DIAGNOSIS — R2681 Unsteadiness on feet: Secondary | ICD-10-CM

## 2023-08-20 DIAGNOSIS — R2689 Other abnormalities of gait and mobility: Secondary | ICD-10-CM

## 2023-08-20 DIAGNOSIS — R278 Other lack of coordination: Secondary | ICD-10-CM

## 2023-08-20 NOTE — Therapy (Signed)
OUTPATIENT PHYSICAL THERAPY NEURO TREATMENT/ 10th VISIT PROGRESS NOTE   Patient Name: Joe BROOKINS Sr. MRN: [id number] DOB:April 11, 1951, 72 y.o., male Today's Date: 08/20/2023  Physical Therapy Progress Note   Dates of Reporting Period:07/10/23-08/20/23  See Note below for Objective Data and Assessment of Progress/Goals.  Thank you for the referral of this patient. Westley Foots, PT, DPT, [name]  PCP: Hoy Register, MD REFERRING PROVIDER: Hoy Register, MD  END OF SESSION:  PT End of Session - 08/20/23 1453     Visit Number 11    Number of Visits 13    Date for PT Re-Evaluation 08/21/23    Authorization Type U[employer] Medicare    PT Start Time 1445    PT Stop Time 1523    PT Time Calculation (min) 38 min    Equipment Utilized During Treatment Gait belt    Activity Tolerance Patient tolerated treatment well    Behavior During Therapy Flat affect             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 ([name])    Diabetes mellitus without complication ([name])    Dysphagia    post stroke   Hypertension    Left upper lobe pneumonia 02/15/2021   Stroke [facility])    Past Surgical History:  Procedure Laterality Date   IR ANGIO INTRA EXTRACRAN SEL COM CAROTID INNOMINATE UNI L MOD SED  03/10/2019   IR ANGIO VERTEBRAL SEL SUBCLAVIAN INNOMINATE BILAT MOD SED  03/10/2019   IR CT HEAD LTD  03/10/2019   IR PERCUTANEOUS ART THROMBECTOMY/INFUSION INTRACRANIAL INC DIAG ANGIO  03/10/2019   RADIOLOGY WITH ANESTHESIA N/A 03/10/2019   Procedure: IR WITH ANESTHESIA/CODE STROKE;  Surgeon: Radiologist, Medication, MD;  [facility]: [facility] OR;  Service: Radiology;  Laterality: N/A;   Patient Active Problem List   Diagnosis Date Noted   Obesity (BMI 30.0-34.9) 03/27/2022   Left hemiparesis ([name])  12/06/2020   Benign essential HTN    Cognitive deficit, post-stroke    Controlled type 2 diabetes mellitus with hyperglycemia, without long-term current use of insulin ([name])    Vestibular schwannoma ([name]) 06/03/2019   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   Dyslipidemia, goal LDL below 70 03/12/2019   Dysphagia due to recent stroke 03/12/2019   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 Continues to walk at a slow, non-functional speed with RW. Denies falls.   Pt accompanied by: significant other  PERTINENT HISTORY: Hx of multiple strokes.  PAIN:  Are you having pain? No  PRECAUTIONS: Fall   PATIENT GOALS: Make the walking better   TODAY'S TREATMENT:    GAIT: -~8' to ambulate ~100' with RW and no intervention to assess for patient "norm"  -scifit hills level 4 x10 mins B UE/LE for pre gait large amplitude reciprocal coordination, heavy work at speed >60spm  -no carryover from intermittent hand over hand assist noted -77' no AD + ModA -heavy work pushing weighted Stedy 115'   -no noted carryover of power to overground gait with RW   PATIENT EDUCATION: Education details: continue HEP Person educated: Patient and  Spouse Education method: Explanation and Demonstration Education comprehension: verbalized understanding  HOME EXERCISE PROGRAM: Access Code: [email or web address] URL: [email or web address] Date: 07/10/2023 Prepared by: Lavone Nian  Exercises - Sit to Stand with Counter Support  - 1 x daily - 7 x weekly - 5 reps  GOALS: Goals reviewed with patient? Yes  SHORT TERM GOALS: Target date: 08/07/2023  Patient will demo compliance with HEP for 50% of the time to improve self management of symptoms Baseline: issued 07/10/23; spouse reports no compliance with walking at home. Goal status: Not met   LONG TERM GOALS: Target date: 09/04/2023    Patient will demo <85 sec with TUG to improve functional mobility at home. Baseline: 99 sec RW (07/10/23) Goal status: Continue  2.  Patient will demo gait speed of 0.15 m/s to improve functional mobility and decrease fall risk Baseline: 0.72m/s with RW (07/10/23); 0.08 m/s (08/01/23) (08/08/23 0.14m/s)  Goal status: IContinue  3.  Patient will be able to perform 5 x sit to stand with use of bil UE in <50 sec to improve funcitonal strength. Baseline: 61 sec with with use of bil UE; 51 sec with use of bil UE Goal status: Progressing continue    ASSESSMENT:  CLINICAL IMPRESSION: Patient seen for skilled PT session with emphasis on gait retraining and improved power. Continues to demonstrate little to no carryover between therapeutic tasks- resorting to very NBOS, slow 9non- functional) gait speed, decreased foot clearance L >R, adduction of L LE as well. Continue POC as able.   OBJECTIVE IMPAIRMENTS: Abnormal gait, decreased activity tolerance, decreased balance, decreased endurance, decreased mobility, difficulty walking, decreased strength, decreased safety awareness, hypomobility, impaired flexibility, impaired tone, impaired UE functional use, improper body mechanics, and postural dysfunction.     PLAN:  PT FREQUENCY: 2x/week  PT  DURATION: 8 weeks  PLANNED INTERVENTIONS: 97164- PT Re-evaluation, 97110-Therapeutic exercises, 97530- Therapeutic activity, 97112- Neuromuscular re-education, 97535- Self Care, 78295- Manual therapy, [phone]- Gait training, [phone]- Orthotic Fit/training, [phone]- Aquatic Therapy, Patient/Family education, Balance training, Stair training, Joint mobilization, Cryotherapy, and Moist heat  PLAN FOR NEXT SESSION: Gait training   Westley Foots, PT, DPT, [name] 08/20/2023, 3:33 PM

## 2023-08-22 ENCOUNTER — Ambulatory Visit: Payer: Medicare Other | Admitting: Physical Therapy

## 2023-08-22 DIAGNOSIS — M6281 Muscle weakness (generalized): Secondary | ICD-10-CM

## 2023-08-22 DIAGNOSIS — R2689 Other abnormalities of gait and mobility: Secondary | ICD-10-CM

## 2023-08-22 DIAGNOSIS — R2681 Unsteadiness on feet: Secondary | ICD-10-CM

## 2023-08-22 DIAGNOSIS — R278 Other lack of coordination: Secondary | ICD-10-CM

## 2023-08-22 DIAGNOSIS — I69319 Unspecified symptoms and signs involving cognitive functions following cerebral infarction: Secondary | ICD-10-CM

## 2023-08-22 NOTE — Therapy (Signed)
OUTPATIENT PHYSICAL THERAPY NEURO TREATMENT   Patient Name: Joe Perry Sr. MRN: [id number] DOB:Jul 29, 1951, 72 y.o., male Today's Date: 08/22/2023  PCP: Hoy Register, MD REFERRING PROVIDER: Hoy Register, MD  END OF SESSION:  PT End of Session - 08/22/23 1400     Visit Number 12    Number of Visits 13    Date for PT Re-Evaluation 08/21/23    Authorization Type UHC Medicare    PT Start Time 1400    PT Stop Time 1440    PT Time Calculation (min) 40 min    Equipment Utilized During Treatment Gait belt    Activity Tolerance Patient tolerated treatment well    Behavior During Therapy Flat affect             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 ([name])    Diabetes mellitus without complication ([name])    Dysphagia    post stroke   Hypertension    Left upper lobe pneumonia 02/15/2021   Stroke [facility]    Past Surgical History:  Procedure Laterality Date   IR ANGIO INTRA EXTRACRAN SEL COM CAROTID INNOMINATE UNI L MOD SED  03/10/2019   IR ANGIO VERTEBRAL SEL SUBCLAVIAN INNOMINATE BILAT MOD SED  03/10/2019   IR CT HEAD LTD  03/10/2019   IR PERCUTANEOUS ART THROMBECTOMY/INFUSION INTRACRANIAL INC DIAG ANGIO  03/10/2019   RADIOLOGY WITH ANESTHESIA N/A 03/10/2019   Procedure: IR WITH ANESTHESIA/CODE STROKE;  Surgeon: Radiologist, Medication, MD;  Location: [facility] OR;  Service: Radiology;  Laterality: N/A;   Patient Active Problem List   Diagnosis Date Noted   Obesity (BMI 30.0-34.9) 03/27/2022   Left hemiparesis ([name]) 12/06/2020   Benign essential HTN    Cognitive deficit, post-stroke    Controlled type 2 diabetes mellitus with hyperglycemia, without long-term current use of insulin ([name])    Vestibular schwannoma ([name]) 06/03/2019   Hemichorea/Hemibalismus LUE  03/31/2019   Dyslipidemia, goal LDL below 70 03/12/2019   Dysphagia due to recent stroke 03/12/2019   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, d/t large vessel dz 03/10/2019   Occlusion of right posterior communicating artery 03/10/2019   Gait disturbance, post-stroke    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    ONSET DATE: 07/03/2023  REFERRING DIAG:  I69.30 (ICD-10-CM) - History of CVA with residual deficit    THERAPY DIAG:  Other abnormalities of gait and mobility  Unsteadiness on feet  Other lack of coordination  Muscle weakness (generalized)  Cognitive deficit, post-stroke  Rationale for Evaluation and Treatment: Rehabilitation  SUBJECTIVE:  SUBJECTIVE STATEMENT: Patient arrives to PT alone- wife remained in waiting room. Continues to walk at a slow, non-functional speed with RW. Denies falls.   Pt accompanied by: significant other  PERTINENT HISTORY: Hx of multiple strokes.  PAIN:  Are you having pain? No  PRECAUTIONS: Fall   PATIENT GOALS: Make the walking better   TODAY'S TREATMENT:    Scifit for neural priming L1 x 8 min with pt maintaining > 80 steps/min Standing balance with hiking pole support: Hiking poles together and then apart x10  Step out and reach x10 R&L with visual targets Amb with hiking poles to work on larger step lengths and reciprocal arm movement min/mod A Amb ~100' with RW with no noted carry over     PATIENT EDUCATION: Education details: continue HEP Person educated: Patient and Spouse Education method: Explanation and Demonstration Education comprehension: verbalized understanding  HOME EXERCISE PROGRAM: Access Code: [email or web address] URL: [email or web address] Date: 07/10/2023 Prepared by: Lavone Nian  Exercises - Sit to Stand  with Counter Support  - 1 x daily - 7 x weekly - 5 reps  GOALS: Goals reviewed with patient? Yes  SHORT TERM GOALS: Target date: 08/07/2023  Patient will demo compliance with HEP for 50% of the time to improve self management of symptoms Baseline: issued 07/10/23; spouse reports no compliance with walking at home. Goal status: Not met   LONG TERM GOALS: Target date: 09/04/2023    Patient will demo <85 sec with TUG to improve functional mobility at home. Baseline: 99 sec RW (07/10/23) Goal status: Continue  2.  Patient will demo gait speed of 0.15 m/s to improve functional mobility and decrease fall risk Baseline: 0.39m/s with RW (07/10/23); 0.08 m/s (08/01/23) (08/08/23 0.4m/s)  Goal status: IContinue  3.  Patient will be able to perform 5 x sit to stand with use of bil UE in <50 sec to improve funcitonal strength. Baseline: 61 sec with with use of bil UE; 51 sec with use of bil UE Goal status: Progressing continue    ASSESSMENT:  CLINICAL IMPRESSION: PT continuing to work on improving gait and stepping. Attempted use of hiking poles to encourage reciprocal arm movement and larger step lengths. Plan to d/c in the next few visits.     OBJECTIVE IMPAIRMENTS: Abnormal gait, decreased activity tolerance, decreased balance, decreased endurance, decreased mobility, difficulty walking, decreased strength, decreased safety awareness, hypomobility, impaired flexibility, impaired tone, impaired UE functional use, improper body mechanics, and postural dysfunction.     PLAN:  PT FREQUENCY: 2x/week  PT DURATION: 8 weeks  PLANNED INTERVENTIONS: 97164- PT Re-evaluation, 97110-Therapeutic exercises, 97530- Therapeutic activity, 97112- Neuromuscular re-education, 97535- Self Care, 81191- Manual therapy, [phone]- Gait training, [phone]- Orthotic Fit/training, [phone]- Aquatic Therapy, Patient/Family education, Balance training, Stair training, Joint mobilization, Cryotherapy, and Moist  heat  PLAN FOR NEXT SESSION: Gait training   Sowmya Partridge April Ma L Bria Portales, PT 08/22/2023, 2:01 PM

## 2023-08-27 ENCOUNTER — Ambulatory Visit: Payer: Medicare Other | Admitting: Physical Therapy

## 2023-08-27 DIAGNOSIS — R278 Other lack of coordination: Secondary | ICD-10-CM

## 2023-08-27 DIAGNOSIS — R2681 Unsteadiness on feet: Secondary | ICD-10-CM

## 2023-08-27 DIAGNOSIS — R2689 Other abnormalities of gait and mobility: Secondary | ICD-10-CM

## 2023-08-27 DIAGNOSIS — M6281 Muscle weakness (generalized): Secondary | ICD-10-CM

## 2023-08-27 NOTE — Therapy (Signed)
OUTPATIENT PHYSICAL THERAPY NEURO TREATMENT AND DISCHARGE  PHYSICAL THERAPY DISCHARGE SUMMARY  Visits from Start of Care: 13  Current functional level related to goals / functional outcomes: See below   Remaining deficits: See below   Education / Equipment: Continuing amb   Patient agrees to discharge. Patient goals were met. Patient is being discharged due to meeting the stated rehab goals.    Patient Name: Joe Perry Sr. MRN: [id number] DOB:1951/06/13, 72 y.o., male Today's Date: 08/27/2023  PCP: Hoy Register, MD REFERRING PROVIDER: Hoy Register, MD  END OF SESSION:  PT End of Session - 08/27/23 1455     Visit Number 13    Number of Visits 13    Date for PT Re-Evaluation 08/21/23    Authorization Type UHC Medicare    PT Start Time 1450    PT Stop Time 1530    PT Time Calculation (min) 40 min    Equipment Utilized During Treatment Gait belt    Activity Tolerance Patient tolerated treatment well    Behavior During Therapy Flat affect             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 ([name])    Diabetes mellitus without complication ([name])    Dysphagia    post stroke   Hypertension    Left upper lobe pneumonia 02/15/2021   Stroke [facility])    Past Surgical History:  Procedure Laterality Date   IR ANGIO INTRA EXTRACRAN SEL COM CAROTID INNOMINATE UNI L MOD SED  03/10/2019   IR ANGIO VERTEBRAL SEL SUBCLAVIAN INNOMINATE BILAT MOD SED  03/10/2019   IR CT HEAD LTD  03/10/2019   IR PERCUTANEOUS ART THROMBECTOMY/INFUSION INTRACRANIAL INC DIAG ANGIO  03/10/2019   RADIOLOGY WITH ANESTHESIA N/A 03/10/2019   Procedure: IR WITH ANESTHESIA/CODE STROKE;  Surgeon: Radiologist, Medication, MD;  Location: [facility] OR;  Service: Radiology;  Laterality: N/A;    Patient Active Problem List   Diagnosis Date Noted   Obesity (BMI 30.0-34.9) 03/27/2022   Left hemiparesis ([name]) 12/06/2020   Benign essential HTN    Cognitive deficit, post-stroke    Controlled type 2 diabetes mellitus with hyperglycemia, without long-term current use of insulin ([name])    Vestibular schwannoma ([name]) 06/03/2019   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   Dyslipidemia, goal LDL below 70 03/12/2019   Dysphagia due to recent stroke 03/12/2019   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, d/t large vessel dz 03/10/2019   Occlusion of right posterior communicating artery 03/10/2019   Gait disturbance, post-stroke    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    ONSET DATE: 07/03/2023  REFERRING DIAG:  I69.30 (ICD-10-CM) - History of CVA with residual deficit    THERAPY DIAG:  Other abnormalities of gait and mobility  Unsteadiness on feet  Other lack of coordination  Muscle weakness (generalized)  Rationale for Evaluation and Treatment: Rehabilitation  SUBJECTIVE:  SUBJECTIVE STATEMENT: Wife states that she feels that pt is improving with his walking at home.    Pt accompanied by: significant other  PERTINENT HISTORY: Hx of multiple strokes.  PAIN:  Are you having pain? No  PRECAUTIONS: Fall   PATIENT GOALS: Make the walking better   TODAY'S TREATMENT:    Rechecked goals 20' in 50.03 sec with RW 20' in 19.78 sec with rollator but unsafe and difficulty with paying attention to L Amb with rollator x 2 laps around gym but requires cueing for a/d negotiation (tends to veer left and not able to pay attention to L side) Amb with marching RW Amb backwards with RW    PATIENT EDUCATION: Education details: continue HEP Person educated: Patient and Spouse Education method:  Medical illustrator Education comprehension: verbalized understanding  HOME EXERCISE PROGRAM: Access Code: [email or web address] URL: [email or web address] Date: 07/10/2023 Prepared by: Lavone Nian  Exercises - Sit to Stand with Counter Support  - 1 x daily - 7 x weekly - 5 reps  GOALS: Goals reviewed with patient? Yes  SHORT TERM GOALS: Target date: 08/07/2023  Patient will demo compliance with HEP for 50% of the time to improve self management of symptoms Baseline: issued 07/10/23; spouse reports no compliance with walking at home. Goal status: Not met   LONG TERM GOALS: Target date: 09/04/2023    Patient will demo <85 sec with TUG to improve functional mobility at home. Baseline: 99 sec RW (07/10/23) 08/27/23: 55.72 sec RW Goal status: MET  2.  Patient will demo gait speed of 0.15 m/s to improve functional mobility and decrease fall risk Baseline: 0.20m/s with RW (07/10/23); 0.08 m/s (08/01/23) (08/08/23 0.79m/s)  08/27/23: 0.12 m/s with RW, 0.3 m/s with rollator Goal status: MET  3.  Patient will be able to perform 5 x sit to stand with use of bil UE in <50 sec to improve funcitonal strength. Baseline: 61 sec with with use of bil UE; 51 sec with use of bil UE 08/27/23: 40.59 sec with bil UEs Goal status: MET    ASSESSMENT:  CLINICAL IMPRESSION: Pt has met all of his PT goals and is ready for PT d/c. Pt with limited carry over to perform HEP at home without family assist but encouraged pt and family to continue to amb and perform bigger steps and push RW. Pt able to amb faster with rollator but not safe and has difficulty negotiating it without running things over on the L side.    OBJECTIVE IMPAIRMENTS: Abnormal gait, decreased activity tolerance, decreased balance, decreased endurance, decreased mobility, difficulty walking, decreased strength, decreased safety awareness, hypomobility, impaired flexibility, impaired tone, impaired UE functional use,  improper body mechanics, and postural dysfunction.     PLAN:  PT FREQUENCY: 2x/week  PT DURATION: 8 weeks  PLANNED INTERVENTIONS: 97164- PT Re-evaluation, 97110-Therapeutic exercises, 97530- Therapeutic activity, 97112- Neuromuscular re-education, 97535- Self Care, 11914- Manual therapy, [phone]- Gait training, [phone]- Orthotic Fit/training, [phone]- Aquatic Therapy, Patient/Family education, Balance training, Stair training, Joint mobilization, Cryotherapy, and Moist heat  PLAN FOR NEXT SESSION: Gait training   Shia Delaine April Ma L Cree Kunert, PT 08/27/2023, 2:55 PM

## 2023-09-19 ENCOUNTER — Encounter: Payer: Self-pay | Admitting: Family Medicine

## 2023-09-20 ENCOUNTER — Telehealth: Payer: Self-pay

## 2023-09-20 NOTE — Telephone Encounter (Signed)
Patients wife was called and informed that letter is ready for pick up.

## 2023-10-09 ENCOUNTER — Other Ambulatory Visit: Payer: Self-pay | Admitting: Critical Care Medicine

## 2023-10-09 DIAGNOSIS — I10 Essential (primary) hypertension: Secondary | ICD-10-CM

## 2023-12-05 ENCOUNTER — Telehealth: Payer: Self-pay

## 2023-12-05 DIAGNOSIS — I6930 Unspecified sequelae of cerebral infarction: Secondary | ICD-10-CM

## 2023-12-05 NOTE — Telephone Encounter (Signed)
 I met with the patient and his wife when they were in the clinic today.  She explained that she is very tired and would benefit from some help caring for him because he cannot be left alone.  She is the only person providing the 24/7 care/supervision that he needs.   He is not eligible for Medicaid and [employer].  I explained about [facility] and encouraged her to contact them for more information. I am not sure of his eligibility for their program without Medicaid but she should speak to them. I provided her with the address and phone number and she said she will go there.   I also explained about [employer]; however, that is usually private pay.  His wife agreed to see [facility] first and then I will speak to her sometime next week to discuss [employer] if needed.  I also offered to make a referral to [facility] for additional assistance with resources and she was in agreement.  Referral placed to [facility] as discussed

## 2023-12-06 ENCOUNTER — Other Ambulatory Visit: Payer: Self-pay | Admitting: Family Medicine

## 2023-12-06 DIAGNOSIS — E1159 Type 2 diabetes mellitus with other circulatory complications: Secondary | ICD-10-CM

## 2023-12-10 ENCOUNTER — Telehealth: Payer: Self-pay | Admitting: *Deleted

## 2023-12-10 NOTE — Progress Notes (Signed)
 Complex Care Management Note  Care Guide Note 12/10/2023 Name: Joe SHARP Sr. MRN: [id number] DOB: 1950/12/23  Joe Gully Sr. is a 73 y.o. year old male who sees Hoy Register, MD for primary care. I reached out to [name] Spouse [name] Austill by phone today to offer complex care management services.  [name] spouse Naeem Quillin was given information about Complex Care Management services today including:   The Complex Care Management services include support from the care team which includes your Nurse Care Manager, Clinical Social Worker, or Pharmacist.  The Complex Care Management team is here to help remove barriers to the health concerns and goals most important to you. Complex Care Management services are voluntary, and the patient may decline or stop services at any time by request to their care team member.   Complex Care Management Consent Status: Patient spouse Joe Perry agreed to services and verbal consent obtained.   Follow up plan:  Telephone appointment with complex care management team member scheduled for:  4/17  Encounter Outcome:  Patient Scheduled  Gwenevere Ghazi  [facility] Health  [facility], [facility] Guide  Direct Dial: [phone]  Fax [phone]

## 2023-12-13 NOTE — Telephone Encounter (Signed)
 I called patient's wife and she said she called [facility] after they left the clinic last week and she has an appointment there next week and will obtain more information . She said she would call me with an update after the appointment.

## 2023-12-18 ENCOUNTER — Telehealth: Payer: Self-pay | Admitting: *Deleted

## 2023-12-18 NOTE — Progress Notes (Signed)
 Complex Care Management Care Guide Note  12/18/2023 Name: Joe KEIDEL Sr. MRN: [id number] DOB: 1951-03-04  Joe Hageman Sr. is a 73 y.o. year old male who is a primary care patient of Newlin, Enobong, MD and is actively engaged with the care management team. I reached out to Joe Hageman Sr. by phone today to assist with re-scheduling  with the BSW.  Follow up plan: Unsuccessful telephone outreach attempt made. A HIPAA compliant phone message was left for the patient providing contact information and requesting a return call.  Barnie Bora  [facility]  [facility], [facility] Guide  Direct Dial: [phone]  Fax [phone]

## 2023-12-18 NOTE — Progress Notes (Signed)
 Complex Care Management Care Guide Note  12/18/2023 Name: Joe ODWYER Sr. MRN: [id number] DOB: 04/26/1951  Joe Hageman Sr. is a 73 y.o. year old male who is a primary care patient of Newlin, Enobong, MD and is actively engaged with the care management team. I reached out to Joe Hageman Sr. by phone today to assist with re-scheduling  with the BSW.  Follow up plan: Telephone appointment with complex care management team member scheduled for:  4/17 with L. Clemons   Barnie Bora  [facility] Health  [facility], [facility] Guide  Direct Dial: [phone]  Fax [phone]

## 2023-12-20 ENCOUNTER — Other Ambulatory Visit: Admitting: Licensed Clinical Social Worker

## 2023-12-20 ENCOUNTER — Other Ambulatory Visit: Payer: Self-pay

## 2023-12-20 NOTE — Patient Instructions (Signed)
 Visit Information  Thank you for taking time to visit with me today. Please don't hesitate to contact me if I can be of assistance to you before our next scheduled appointment.  Our next appointment is by telephone on 01/03/24 at 11am Please call the care guide team at [phone] if you need to cancel or reschedule your appointment.   Following is a copy of your care plan:   Goals Addressed             This Visit's Progress    VBCI Social Work Care Plan       Problems:   Level of care concerns  CSW Clinical Goal(s):   Over the next 2 weeks the Caregiver will will follow up with Medicaid, Caregiver support programs and [facility] as directed by Social Work.  Interventions:  SW provided information on [facility] [employer] [facility] [employer], [employer], [employer]  Patient Goals/Self-Care Activities:  Programme researcher, broadcasting/film/video of Social Services [phone] to apply for Medicaid. Coordinate with [facility] to assist with Caregiver Respite Program.  Plan:   Telephone follow up appointment with care management team member scheduled for:  01/03/24 at 11am.        Please call 911 if you are experiencing a Mental Health or Behavioral Health Crisis or need someone to talk to.  Patient verbalizes understanding of instructions and care plan provided today and agrees to view in MyChart. Active MyChart status and patient understanding of how to access instructions and care plan via MyChart confirmed with patient.     Dallis Dues, BSW [place]  [facility], [facility] Social Worker Direct Dial: [phone]  Fax: [phone] Website: [email or web address]

## 2023-12-20 NOTE — Patient Outreach (Signed)
 Complex Care Management   Visit Note  12/20/2023  Name:  Joe DORNFELD Sr. MRN: [id number] DOB: 1950-10-23  Situation: Referral received for Complex Care Management related to SDOH Barriers:  Personal care services  I obtained verbal consent from Patient.  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)    Diabetes mellitus without complication ([name])    Dysphagia    post stroke   Hypertension    Left upper lobe pneumonia 02/15/2021   Stroke [facility])     Assessment: Patient Reported Symptoms:  Cognitive        Neurological      HEENT        Cardiovascular      Respiratory      Endocrine      Gastrointestinal        Genitourinary      Integumentary      Musculoskeletal          Psychosocial       Quality of Family Relationships: supportive Do you feel physically threatened by others?: No      03/27/2023    2:00 PM  Depression screen PHQ 2/9  Decreased Interest 0  Down, Depressed, Hopeless 0  PHQ - 2 Score 0    There were no vitals filed for this visit.  Medications Reviewed Today   Medications were not reviewed in this encounter     Recommendation:   Patient is eligible for $60 OTC and $30 house call reward. SW request new card (to arrive in 2 wks).  Patient will apply for community resources for assistance with Caregiver Respite. Household income $1800 after deductions. Patients wife will apply for Medicaid to cover Medicare premium.    Follow Up Plan:   Telephone follow up appointment date/time:  01/03/24 at 11am  Dallis Dues, BSW   [facility], [facility] Social Worker Direct Dial: [phone]  Fax:  [phone] Website: [email or web address]

## 2023-12-27 NOTE — Telephone Encounter (Signed)
 I spoke to the patient's wife and she said her husband is doing well. She has not been to [facility] yet and said she has an appointment there tomorrow and will then call me with an update after the appointment.

## 2023-12-31 ENCOUNTER — Other Ambulatory Visit: Payer: Self-pay | Admitting: Family Medicine

## 2024-01-01 ENCOUNTER — Ambulatory Visit: Payer: Medicare Other | Admitting: Family Medicine

## 2024-01-03 ENCOUNTER — Other Ambulatory Visit: Payer: Self-pay

## 2024-01-03 NOTE — Patient Instructions (Signed)
 Visit Information  Thank you for taking time to visit with me today. Please don't hesitate to contact me if I can be of assistance to you before our next scheduled appointment.  Your next care management appointment is by telephone on 01/10/24 at 11am  Please call the care guide team at [phone] if you need to cancel, schedule, or reschedule an appointment.   Please call 911 if you are experiencing a Mental Health or Behavioral Health Crisis or need someone to talk to.  Dallis Dues, BSW [place]  [facility], [facility] Social Worker Direct Dial: [phone]  Fax: [phone] Website: [email or web address]

## 2024-01-03 NOTE — Patient Outreach (Signed)
 Complex Care Management   Visit Note  01/03/2024  Name:  Joe Perry Sr. MRN: [id number] DOB: 1951-01-03  Situation: Referral received for Complex Care Management related to  Medicaid and Respite  I obtained verbal consent from Caregiver.  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  SW educated on [place] program that could pay for [employer], but it is not full Medicaid.  Patient agreed to visit DSS today to inquire.  SW encouraged patient to take needed documentation when she goes to apply.  Patients wife attempted to contact resources for respite but there was a communication barrier.  SW t/c Geographical information systems officer and left vm for [employer] SW t/c [employer] and left a vm for [employer] [id number] [phone].     Recommendation:   No provider referral at this time.  Follow Up Plan:   Telephone follow up appointment date/time:  01/10/24 at 11am  Dallis Dues, BSW [place]  [facility], [facility] Social Worker Direct Dial: [phone]  Fax: [phone] Website: [email or web address]

## 2024-01-09 NOTE — Telephone Encounter (Signed)
 I spoke to the patient's wife and she said she has contacted [facility] but she was not at home and was not able to talk, she was driving. She said she would try to call me next week when she is home.

## 2024-01-10 ENCOUNTER — Other Ambulatory Visit: Payer: Self-pay

## 2024-01-10 NOTE — Patient Instructions (Signed)
 Visit Information  Thank you for taking time to visit with me today. Please don't hesitate to contact me if I can be of assistance to you before our next scheduled appointment.  Your next care management appointment is by telephone on 01/31/24 at 11am  Please call the care guide team at [phone] if you need to cancel, schedule, or reschedule an appointment.   Please call 911 if you are experiencing a Mental Health or Behavioral Health Crisis or need someone to talk to.  Dallis Dues, BSW [place]  [facility], [facility] Social Worker Direct Dial: [phone]  Fax: [phone] Website: [email or web address]

## 2024-01-10 NOTE — Patient Outreach (Addendum)
 Complex Care Management   Visit Note  01/10/2024  Name:  Joe MCNULTY Sr. MRN: [id number] DOB: [birth year]  Situation: Referral received for Complex Care Management related to Caregiver support I obtained verbal consent from Caregiver.  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  The application was completed.  Mrs. Joe was told 3 weeks to process.  Mrs. Joe did not get a return call from [employer] or [employer].    SDOH Interventions    Flowsheet Row Clinical Support from 12/06/2022 in [facility] [name]. [facility] Telephone from 05/30/2022 in [facility] Community Care Coordination  SDOH Interventions    Food Insecurity Interventions Intervention Not Indicated Intervention Not Indicated  Housing Interventions Intervention Not Indicated --  Transportation Interventions Intervention Not Indicated Intervention Not Indicated  Utilities Interventions Intervention Not Indicated --  Financial Strain Interventions Intervention Not Indicated --  Physical Activity Interventions Patient Refused, Other (Comments) --  Stress Interventions Intervention Not Indicated --       Recommendation:   No provider recommendations.  Follow Up Plan:   Telephone follow up appointment date/time:  01/31/24 at 11am.  Dallis Dues,  BSW [place]  [facility], [facility] Social Worker Direct Dial: [phone]  Fax: [phone] Website: [email or web address]

## 2024-01-16 ENCOUNTER — Ambulatory Visit: Attending: Family Medicine | Admitting: Family Medicine

## 2024-01-16 VITALS — BP 107/72 | HR 80 | Ht 76.0 in

## 2024-01-16 DIAGNOSIS — E1159 Type 2 diabetes mellitus with other circulatory complications: Secondary | ICD-10-CM

## 2024-01-16 DIAGNOSIS — R600 Localized edema: Secondary | ICD-10-CM

## 2024-01-16 DIAGNOSIS — K5900 Constipation, unspecified: Secondary | ICD-10-CM

## 2024-01-16 DIAGNOSIS — I69319 Unspecified symptoms and signs involving cognitive functions following cerebral infarction: Secondary | ICD-10-CM

## 2024-01-16 DIAGNOSIS — Z7984 Long term (current) use of oral hypoglycemic drugs: Secondary | ICD-10-CM

## 2024-01-16 DIAGNOSIS — E785 Hyperlipidemia, unspecified: Secondary | ICD-10-CM

## 2024-01-16 DIAGNOSIS — Z8673 Personal history of transient ischemic attack (TIA), and cerebral infarction without residual deficits: Secondary | ICD-10-CM

## 2024-01-16 DIAGNOSIS — I6930 Unspecified sequelae of cerebral infarction: Secondary | ICD-10-CM

## 2024-01-16 DIAGNOSIS — I10 Essential (primary) hypertension: Secondary | ICD-10-CM

## 2024-01-16 DIAGNOSIS — E860 Dehydration: Secondary | ICD-10-CM

## 2024-01-16 DIAGNOSIS — G4700 Insomnia, unspecified: Secondary | ICD-10-CM

## 2024-01-16 DIAGNOSIS — E119 Type 2 diabetes mellitus without complications: Secondary | ICD-10-CM

## 2024-01-16 DIAGNOSIS — I152 Hypertension secondary to endocrine disorders: Secondary | ICD-10-CM

## 2024-01-16 LAB — POCT GLYCOSYLATED HEMOGLOBIN (HGB A1C): HbA1c, POC (controlled diabetic range): 6.4 % (ref 0.0–7.0)

## 2024-01-16 MED ORDER — AMLODIPINE BESYLATE 10 MG PO TABS: 10.0000 mg | ORAL_TABLET | Freq: Every day | ORAL | 1 refills | Status: DC

## 2024-01-16 MED ORDER — CHLORTHALIDONE 25 MG PO TABS: 25.0000 mg | ORAL_TABLET | Freq: Every day | ORAL | 1 refills | Status: DC

## 2024-01-16 MED ORDER — DONEPEZIL HCL 10 MG PO TABS: 10.0000 mg | ORAL_TABLET | Freq: Every day | ORAL | 1 refills | Status: DC

## 2024-01-16 MED ORDER — LOSARTAN POTASSIUM 50 MG PO TABS: ORAL_TABLET | ORAL | 1 refills | Status: DC

## 2024-01-16 MED ORDER — ATORVASTATIN CALCIUM 40 MG PO TABS: ORAL_TABLET | ORAL | 1 refills | Status: DC

## 2024-01-16 MED ORDER — QUETIAPINE FUMARATE 100 MG PO TABS: 100.0000 mg | ORAL_TABLET | Freq: Every day | ORAL | 1 refills | Status: DC

## 2024-01-16 MED ORDER — METFORMIN HCL 500 MG PO TABS
1000.0000 mg | ORAL_TABLET | Freq: Two times a day (BID) | ORAL | 1 refills | Status: DC
Start: 2024-01-16 — End: 2024-07-18

## 2024-01-16 NOTE — Progress Notes (Unsigned)
 Subjective:  Patient ID: Joe Hageman Sr., male    DOB: July 27, 1951  Age: 73 y.o. MRN: [id number]  CC: Medical Management of Chronic Issues (No concerns )     Discussed the use of AI scribe software for clinical note transcription with the patient, who gave verbal consent to proceed.  History of Present Illness Joe ITEN Sr. is a 73 year old male with a history of type 2 diabetes mellitus, hypertension, hyperlipidemia, insomnia, CVA with residual hemiparesis. diabetes and hypertension who presents for a follow-up visit. Companied by spouse who provides the history.  Diabetes is well controlled with an A1c of 6.4, managed with metformin . Blood sugar levels remain stable with regular monitoring. Hypertension is well controlled with amlodipine , losartan , and chlorthalidone . He also takes atorvastatin , Aricept , Seroquel , trazodone , and aspirin .  He experiences difficulty with hydration, disliking water, and attempts to increase fluid intake have been unsuccessful. This contributes to constipation, managed with a stool softener once a week. No new bowel movement issues are present aside from constipation related to low water intake.  He uses a walker at home and occasionally a wheelchair for longer distances. His legs are swollen. Seroquel  aids sleep effectively, with no reported sleep issues.    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 ([name])    Diabetes mellitus without complication ([name])    Dysphagia    post stroke   Hypertension    Left upper lobe pneumonia 02/15/2021   Stroke [facility])     Past Surgical History:  Procedure Laterality Date   IR ANGIO INTRA EXTRACRAN SEL COM CAROTID INNOMINATE UNI L MOD SED  03/10/2019   IR ANGIO VERTEBRAL SEL SUBCLAVIAN INNOMINATE  BILAT MOD SED  03/10/2019   IR CT HEAD LTD  03/10/2019   IR PERCUTANEOUS ART THROMBECTOMY/INFUSION INTRACRANIAL INC DIAG ANGIO  03/10/2019   RADIOLOGY WITH ANESTHESIA N/A 03/10/2019   Procedure: IR WITH ANESTHESIA/CODE STROKE;  Surgeon: Radiologist, Medication, MD;  Location: [facility] OR;  Service: Radiology;  Laterality: N/A;    Family History  Problem Relation Age of Onset   Diabetes Other    Hypertension Other    Healthy Mother    Healthy Father     Social History   Socioeconomic History   Marital status: Married    Spouse name: [name]   Number of children: Not on file   Years of education: Not on file   Highest education level: Not on file  Occupational History   Not on file  Tobacco Use   Smoking status: Never   Smokeless tobacco: Never  Vaping Use   Vaping status: Never Used  Substance and Sexual Activity   Alcohol use: No   Drug use: No   Sexual activity: Not Currently  Other Topics Concern   Not on file  Social History Narrative   12/16/19 Lives with wife   Social Drivers of Health   Financial Resource Strain: Low Risk  (01/16/2024)   Overall Financial Resource Strain (CARDIA)    Difficulty of Paying Living Expenses: Not very hard  Food Insecurity: No Food Insecurity (01/16/2024)   Hunger Vital Sign    Worried About Running Out of Food in the Last Year: Never true    Ran Out of Food in the Last Year: Never true  Transportation Needs: No Transportation Needs (01/16/2024)  PRAPARE - Administrator, Civil Service (Medical): No    Lack of Transportation (Non-Medical): No  Physical Activity: Sufficiently Active (01/16/2024)   Exercise Vital Sign    Days of Exercise per Week: 4 days    Minutes of Exercise per Session: 40 min  Stress: No Stress Concern Present (01/16/2024)   [employer] of Occupational Health - Occupational Stress Questionnaire    Feeling of Stress : Not at all  Social Connections: Moderately Integrated (01/16/2024)   Social Connection and  Isolation Panel [NHANES]    Frequency of Communication with Friends and Family: Once a week    Frequency of Social Gatherings with Friends and Family: Once a week    Attends Religious Services: 1 to 4 times per year    Active Member of [employer] or Organizations: Yes    Attends Banker Meetings: 1 to 4 times per year    Marital Status: Married    No Known Allergies  Outpatient Medications Prior to Visit  Medication Sig Dispense Refill   acetaminophen  (TYLENOL ) 325 MG tablet Take 2 tablets (650 mg total) by mouth every 4 (four) hours as needed for mild pain (or temp > 37.5 C (99.5 F)).     amLODipine  (NORVASC ) 10 MG tablet TAKE 1 TABLET BY MOUTH ONCE DAILY TO  LOWER  BLOOD  PRESSURE 90 tablet 0   amLODipine  (NORVASC ) 5 MG tablet Take 1 tablet (5 mg total) by mouth daily. To lower blood pressure 90 tablet 1   aspirin  EC 81 MG tablet Take 1 tablet (81 mg total) by mouth daily. 100 tablet 3   atorvastatin  (LIPITOR) 40 MG tablet TAKE 1 TABLET BY MOUTH IN THE EVENING TO LOWER CHOLESTEROL. 90 tablet 1   cetirizine  (ZYRTEC ) 10 MG tablet Take 1 tablet (10 mg total) by mouth daily. 90 tablet 1   chlorthalidone  (HYGROTON ) 25 MG tablet Take 1/2 (one-half) tablet by mouth once daily 45 tablet 0   donepezil  (ARICEPT ) 10 MG tablet Take 1 tablet (10 mg total) by mouth at bedtime. 90 tablet 1   losartan  (COZAAR ) 50 MG tablet 1 tab(s) orally once a day for 30 day(s) 90 tablet 1   metFORMIN  (GLUCOPHAGE ) 500 MG tablet Take 2 tablets (1,000 mg total) by mouth 2 (two) times daily with a meal. 360 tablet 1   QUEtiapine  (SEROQUEL ) 100 MG tablet Take 1 tablet (100 mg total) by mouth at bedtime. 30 tablet 0   traZODone  (DESYREL ) 50 MG tablet Take 1 tablet (50 mg total) by mouth at bedtime. 90 tablet 1   azelastine  (ASTELIN ) 0.1 % nasal spray Place 2 sprays into both nostrils 2 (two) times daily. Use in each nostril as directed (Patient not taking: Reported on 01/16/2024) 30 mL 12   Misc. Devices MISC  [place]. Diagnosis - CVA (Patient not taking: Reported on 01/16/2024) 1 each 0   Misc. Devices MISC 2 wheeled walker.  Diagnosis - Stroke (Patient not taking: Reported on 01/16/2024) 1 each 0   No facility-administered medications prior to visit.     ROS Review of Systems  Unable to perform ROS: Patient nonverbal  Psychiatric/Behavioral:         Baseline. Unable to obtain full review of system due to patient being nonverbal    Objective:  BP 107/72   Pulse 80   Ht 6\' 4"  (1.93 m)   SpO2 98%   BMI 22.45 kg/m      01/16/2024    1:42 PM 08/08/2023  2:53 PM 08/01/2023    2:37 PM  BP/Weight  Systolic BP 107 145 110  Diastolic BP 72 89 70  Wt. (Lbs) --        Physical Exam Constitutional:      Appearance: He is well-developed.  Cardiovascular:     Rate and Rhythm: Normal rate.     Heart sounds: Normal heart sounds. No murmur heard. Pulmonary:     Effort: Pulmonary effort is normal.     Breath sounds: Normal breath sounds. No wheezing or rales.  Chest:     Chest wall: No tenderness.  Abdominal:     General: Bowel sounds are normal. There is no distension.     Palpations: Abdomen is soft. There is no mass.     Tenderness: There is no abdominal tenderness.  Musculoskeletal:        General: Normal range of motion.     Right lower leg: No edema.     Left lower leg: No edema.  Neurological:     Mental Status: He is alert and oriented to person, place, and time.  Psychiatric:        Mood and Affect: Mood normal.        Latest Ref Rng & Units 03/27/2023    2:32 PM 10/03/2022    3:35 PM 03/28/2022    4:28 PM  CMP  Glucose 70 - 99 mg/dL 409  86  811   BUN 8 - 27 mg/dL 22  13  14    Creatinine 0.76 - 1.27 mg/dL 9.14  7.82  9.56   Sodium 134 - 144 mmol/L 140  139  142   Potassium 3.5 - 5.2 mmol/L 4.3  4.8  4.9   Chloride 96 - 106 mmol/L 100  99  105   CO2 20 - 29 mmol/L 30  25  24    Calcium  8.6 - 10.2 mg/dL 9.7  21.3  9.5   Total Protein 6.0 - 8.5 g/dL  7.1  7.2    Total Bilirubin 0.0 - 1.2 mg/dL  0.4  0.5   Alkaline Phos 44 - 121 IU/L  71  63   AST 0 - 40 IU/L  16  15   ALT 0 - 44 IU/L  14  16     Lipid Panel     Component Value Date/Time   CHOL 108 10/03/2022 1535   TRIG 52 10/03/2022 1535   HDL 49 10/03/2022 1535   CHOLHDL 2.2 10/03/2022 1535   CHOLHDL 3.5 03/11/2019 0946   VLDL 13 03/11/2019 0946   LDLCALC 47 10/03/2022 1535    CBC    Component Value Date/Time   WBC 5.3 03/28/2022 1628   WBC 5.3 02/17/2021 0256   RBC 3.95 (L) 03/28/2022 1628   RBC 4.03 (L) 02/17/2021 0256   HGB 12.1 (L) 03/28/2022 1628   HCT 36.3 (L) 03/28/2022 1628   PLT 219 03/28/2022 1628   MCV 92 03/28/2022 1628   MCH 30.6 03/28/2022 1628   MCH 30.5 02/17/2021 0256   MCHC 33.3 03/28/2022 1628   MCHC 32.8 02/17/2021 0256   RDW 12.0 03/28/2022 1628   LYMPHSABS 2.6 03/28/2022 1628   MONOABS 0.5 02/15/2021 1442   EOSABS 0.2 03/28/2022 1628   BASOSABS 0.0 03/28/2022 1628    Lab Results  Component Value Date   HGBA1C 7.2 (A) 07/03/2023      Assessment and Plan Assessment & Plan       No orders of the defined types were placed in  this encounter.   Follow-up: No follow-ups on file.       Joaquin Mulberry, MD, FAAFP. [facility] [place], [place] [phone]   01/16/2024, 1:52 PM

## 2024-01-16 NOTE — Patient Instructions (Signed)
 VISIT SUMMARY:  You had a follow-up visit to review your diabetes, hypertension, and other health concerns. Your diabetes and hypertension are well controlled with your current medications. We discussed your difficulty with hydration, leg swelling, and constipation. Your sleep is well managed with your current medication.  YOUR PLAN:  -TYPE II DIABETES MELLITUS: Your diabetes is well controlled with an A1c of 6.4. This means your blood sugar levels are stable. Continue your current diabetes management regimen, and make sure to monitor your blood glucose levels closely, especially during your travel to [place]. Ensure you have a blood glucose monitor and a 90-day supply of your medications for the trip.  -HYPERTENSION: Your hypertension is well managed with your current medications. Hypertension means high blood pressure. Continue taking your antihypertensive medications: amlodipine , losartan , and chlorthalidone  as prescribed.  -EDEMA: You have swelling in your legs, likely due to prolonged sitting. Edema means swelling caused by excess fluid trapped in your body's tissues. To help reduce the swelling, use a stool or recliner to elevate your legs when sitting.  -DEHYDRATION RISK: You are at risk of dehydration because you do not drink enough water. Dehydration means your body does not have enough fluids to function properly. Try drinking sparkling water to increase your fluid intake and consider other methods to stay hydrated.  -CONSTIPATION: Your constipation is likely due to not drinking enough fluids. Constipation means having difficulty with bowel movements. Continue using the stool softener as needed and try to drink more fluids to help with bowel regularity.  -INSOMNIA: Your insomnia is managed with quetiapine  (Seroquel ). Insomnia means having trouble sleeping. Continue taking quetiapine  for sleep as prescribed.  INSTRUCTIONS:  Please ensure you have a blood glucose monitor and a 90-day  supply of your medications for your travel to [place]. Continue monitoring your blood glucose levels closely during your trip.

## 2024-01-16 NOTE — Telephone Encounter (Signed)
 I met with the patient and his wife when they were in the clinic this morning for his appointment.  She explained that she contacted [facility] and was told that it would cost hm $4000.   She then said she spoke to someone who told her that she could be paid $500 to care for him.  She did not have the person's name with her and said she would have to call me with that information.  I told her that I could then check on the information that the person had shared with her.  I also explained about CAP services. I said that might be what the person who mentioned $500 was referring to.  I told her that he does not have Medicaid but she can still refer him for CAP and see if he may be eligible as the income limit is different for CAP. I provided her with the number for CAP/[place] [employer]:  [phone]  and encouraged her to call.

## 2024-01-17 ENCOUNTER — Encounter: Payer: Self-pay | Admitting: Family Medicine

## 2024-01-17 ENCOUNTER — Ambulatory Visit: Payer: Self-pay | Admitting: Family Medicine

## 2024-01-17 LAB — CMP14+EGFR
ALT: 12 IU/L (ref 0–44)
AST: 15 IU/L (ref 0–40)
Albumin: 4.3 g/dL (ref 3.9–4.9)
Alkaline Phosphatase: 66 IU/L (ref 44–121)
BUN/Creatinine Ratio: 16 (ref 10–24)
BUN: 19 mg/dL (ref 8–27)
Bilirubin Total: 0.4 mg/dL (ref 0.0–1.2)
CO2: 24 mmol/L (ref 20–29)
Calcium: 9.6 mg/dL (ref 8.6–10.2)
Chloride: 100 mmol/L (ref 96–106)
Creatinine, Ser: 1.17 mg/dL (ref 0.76–1.27)
Globulin, Total: 2.7 g/dL (ref 1.5–4.5)
Glucose: 162 mg/dL — ABNORMAL HIGH (ref 70–99)
Potassium: 4.5 mmol/L (ref 3.5–5.2)
Sodium: 141 mmol/L (ref 134–144)
Total Protein: 7 g/dL (ref 6.0–8.5)
eGFR: 67 mL/min/{1.73_m2} (ref >59)

## 2024-01-22 ENCOUNTER — Ambulatory Visit: Attending: Family Medicine

## 2024-01-22 VITALS — Ht 76.0 in | Wt 185.0 lb

## 2024-01-22 DIAGNOSIS — Z599 Problem related to housing and economic circumstances, unspecified: Secondary | ICD-10-CM

## 2024-01-22 DIAGNOSIS — Z0000 Encounter for general adult medical examination without abnormal findings: Secondary | ICD-10-CM

## 2024-01-22 NOTE — Progress Notes (Signed)
 Because this visit was a virtual/telehealth visit,  certain criteria was not obtained, such a blood pressure, CBG if applicable, and timed get up and go. Any medications not marked as "taking" were not mentioned during the medication reconciliation part of the visit. Any vitals not documented were not able to be obtained due to this being a telehealth visit or patient was unable to self-report a recent blood pressure reading due to a lack of equipment at home via telehealth. Vitals that have been documented are verbally provided by the patient.   Subjective:   Joe Hageman Sr. is a 73 y.o. who presents for a Medicare Wellness preventive visit.  As a reminder, Annual Wellness Visits don't include a physical exam, and some assessments may be limited, especially if this visit is performed virtually. We may recommend an in-person follow-up visit with your provider if needed.  Visit Complete: Virtual I connected with  Joe Hageman Sr. on 01/22/24 by a audio enabled telemedicine application and verified that I am speaking with the correct person using two identifiers.  Patient Location: Home  Provider Location: Office/Clinic  I discussed the limitations of evaluation and management by telemedicine. The patient expressed understanding and agreed to proceed.  Vital Signs: Because this visit was a virtual/telehealth visit, some criteria may be missing or patient reported. Any vitals not documented were not able to be obtained and vitals that have been documented are patient reported.  VideoDeclined- This patient declined Librarian, academic. Therefore the visit was completed with audio only.  Persons Participating in Visit: Patient assisted by wife.  AWV Questionnaire: No: Patient Medicare AWV questionnaire was not completed prior to this visit.  Cardiac Risk Factors include: advanced age (>78men, >69 women);sedentary lifestyle;family history of premature  cardiovascular disease;hypertension;male gender     Objective:     Today's Vitals   01/22/24 1654  Weight: 185 lb (83.9 kg)  Height: 6\' 4"  (1.93 m)  PainSc: 0-No pain   Body mass index is 22.52 kg/m.     01/22/2024    4:55 PM 07/10/2023    1:09 PM 12/06/2022    4:33 PM 05/09/2022    2:02 PM 04/07/2022   12:38 PM 02/16/2021    3:24 PM 10/25/2020    9:41 AM  Advanced Directives  Does Patient Have a Medical Advance Directive? No No No Yes Yes Unable to assess, patient is non-responsive or altered mental status Yes  Type of Advance Directive    Healthcare Power of [employer] Power of Attorney  Does patient want to make changes to medical advance directive?    No - Patient declined   No - Patient declined  Copy of Healthcare Power of Attorney in Chart?    No - copy requested   No - copy requested  Would patient like information on creating a medical advance directive? No - Patient declined No - Patient declined         Current Medications (verified) Outpatient Encounter Medications as of 01/22/2024  Medication Sig   acetaminophen  (TYLENOL ) 325 MG tablet Take 2 tablets (650 mg total) by mouth every 4 (four) hours as needed for mild pain (or temp > 37.5 C (99.5 F)).   amLODipine  (NORVASC ) 10 MG tablet Take 1 tablet (10 mg total) by mouth daily.   aspirin  EC 81 MG tablet Take 1 tablet (81 mg total) by mouth daily.   atorvastatin  (LIPITOR) 40 MG tablet TAKE 1 TABLET BY MOUTH IN THE EVENING  TO LOWER CHOLESTEROL.   azelastine  (ASTELIN ) 0.1 % nasal spray Place 2 sprays into both nostrils 2 (two) times daily. Use in each nostril as directed (Patient not taking: Reported on 01/16/2024)   cetirizine  (ZYRTEC ) 10 MG tablet Take 1 tablet (10 mg total) by mouth daily.   chlorthalidone  (HYGROTON ) 25 MG tablet Take 1 tablet (25 mg total) by mouth daily.   donepezil  (ARICEPT ) 10 MG tablet Take 1 tablet (10 mg total) by mouth at bedtime.   losartan  (COZAAR ) 50 MG tablet 1 tab(s) orally once a day  for 30 day(s)   metFORMIN  (GLUCOPHAGE ) 500 MG tablet Take 2 tablets (1,000 mg total) by mouth 2 (two) times daily with a meal.   Misc. Devices MISC [place]. Diagnosis - CVA (Patient not taking: Reported on 01/16/2024)   Misc. Devices MISC 2 wheeled walker.  Diagnosis - Stroke (Patient not taking: Reported on 01/16/2024)   QUEtiapine  (SEROQUEL ) 100 MG tablet Take 1 tablet (100 mg total) by mouth at bedtime.   No facility-administered encounter medications on file as of 01/22/2024.    Allergies (verified) Patient has no known allergies.   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)    Diabetes mellitus without complication ([name])    Dysphagia    post stroke   Hypertension    Left upper lobe pneumonia 02/15/2021   Oxygen deficiency    Stroke [facility])    Past Surgical History:  Procedure Laterality Date   IR ANGIO INTRA EXTRACRAN SEL COM CAROTID INNOMINATE UNI L MOD SED  03/10/2019   IR ANGIO VERTEBRAL SEL SUBCLAVIAN INNOMINATE BILAT MOD SED  03/10/2019   IR CT HEAD LTD  03/10/2019   IR PERCUTANEOUS ART THROMBECTOMY/INFUSION INTRACRANIAL INC DIAG ANGIO  03/10/2019   RADIOLOGY WITH ANESTHESIA N/A 03/10/2019   Procedure: IR WITH ANESTHESIA/CODE STROKE;  Surgeon: Radiologist, Medication, MD;  Location: [facility] OR;  Service: Radiology;  Laterality: N/A;   Family History  Problem Relation Age of Onset   Diabetes Other    Hypertension Other    Healthy Mother    Healthy Father    Social History   Socioeconomic History   Marital status: Married    Spouse name: [name]   Number of children: Not on file   Years of education: Not on file   Highest education level: Not on file  Occupational History   Not on file  Tobacco Use   Smoking status: Never   Smokeless tobacco: Never   Vaping Use   Vaping status: Never Used  Substance and Sexual Activity   Alcohol use: No   Drug use: No   Sexual activity: Not Currently  Other Topics Concern   Not on file  Social History Narrative   12/16/19 Lives with wife   Social Drivers of Health   Financial Resource Strain: High Risk (01/22/2024)   Overall Financial Resource Strain (CARDIA)    Difficulty of Paying Living Expenses: Very hard  Food Insecurity: No Food Insecurity (01/22/2024)   Hunger Vital Sign    Worried About Running Out of Food in the Last Year: Never true    Ran Out of Food in the Last Year: Never true  Transportation Needs: No Transportation Needs (01/22/2024)   PRAPARE - Administrator, Civil Service (Medical): No    Lack of Transportation (  Non-Medical): No  Physical Activity: Sufficiently Active (01/22/2024)   Exercise Vital Sign    Days of Exercise per Week: 4 days    Minutes of Exercise per Session: 40 min  Stress: No Stress Concern Present (01/22/2024)   [employer] of Occupational Health - Occupational Stress Questionnaire    Feeling of Stress : Not at all  Social Connections: Moderately Integrated (01/22/2024)   Social Connection and Isolation Panel [NHANES]    Frequency of Communication with Friends and Family: Once a week    Frequency of Social Gatherings with Friends and Family: Once a week    Attends Religious Services: 1 to 4 times per year    Active Member of [employer] or Organizations: Yes    Attends Banker Meetings: 1 to 4 times per year    Marital Status: Married    Tobacco Counseling Counseling given: Not Answered    Clinical Intake:  Pre-visit preparation completed: Yes  Pain : No/denies pain Pain Score: 0-No pain     BMI - recorded: 22.52 Nutritional Status: BMI of 19-24  Normal Nutritional Risks: None Diabetes: No  Lab Results  Component Value Date   HGBA1C 6.4 01/16/2024   HGBA1C 7.2 (A) 07/03/2023   HGBA1C 6.3 03/27/2023     How  often do you need to have someone help you when you read instructions, pamphlets, or other written materials from your doctor or pharmacy?: 1 - Never  Interpreter Needed?: No  Information entered by :: Tj Kitchings N. Koren Plyler, LPN.   Activities of Daily Living     01/22/2024    4:57 PM  In your present state of health, do you have any difficulty performing the following activities:  Hearing? 0  Vision? 0  Difficulty concentrating or making decisions? 1  Walking or climbing stairs? 1  Dressing or bathing? 0  Doing errands, shopping? 0  Preparing Food and eating ? N  Using the Toilet? N  In the past six months, have you accidently leaked urine? N  Do you have problems with loss of bowel control? N  Managing your Medications? N  Managing your Finances? N  Housekeeping or managing your Housekeeping? N    Patient Care Team: Joaquin Mulberry, MD as PCP - General (Family Medicine)  Indicate any recent Medical Services you may have received from other than [facility] providers in the past year (date may be approximate).     Assessment:    This is a routine wellness examination for Horace.  Hearing/Vision screen Hearing Screening - Comments:: Denies hearing difficulties.  Vision Screening - Comments:: No rx glasses - up to date with routine eye exams.    Goals Addressed             This Visit's Progress    Client understands the importance of follow-up with providers by attending scheduled visits         Depression Screen     01/22/2024    4:56 PM 01/16/2024    1:44 PM 03/27/2023    2:00 PM 12/19/2022    2:25 PM 12/06/2022    4:34 PM 11/14/2022    9:41 AM 10/03/2022    5:33 PM  PHQ 2/9 Scores  PHQ - 2 Score 0 0 0 0 0 0 0  PHQ- 9 Score 0 0  0  0 0    Fall Risk     01/22/2024    4:56 PM 07/03/2023    2:31 PM 03/27/2023    2:00 PM 12/19/2022  2:25 PM 12/06/2022    4:33 PM  Fall Risk   Falls in the past year? 0 0 0 0 0  Number falls in past yr: 0 0 0 0 0  Injury with  Fall? 0 0 0 0 0  Risk for fall due to : No Fall Risks No Fall Risks No Fall Risks No Fall Risks Impaired mobility;Impaired balance/gait;Medication side effect  Follow up Falls evaluation completed Falls evaluation completed   Falls prevention discussed;Education provided;Falls evaluation completed    MEDICARE RISK AT HOME:  Medicare Risk at Home Any stairs in or around the home?: Yes If so, are there any without handrails?: No Home free of loose throw rugs in walkways, pet beds, electrical cords, etc?: Yes Adequate lighting in your home to reduce risk of falls?: Yes Life alert?: No Use of a cane, walker or w/c?: Yes Grab bars in the bathroom?: Yes Shower chair or bench in shower?: Yes Elevated toilet seat or a handicapped toilet?: Yes  TIMED UP AND GO:  Was the test performed?  No  Cognitive Function: Impaired: Patient has current diagnosis of cognitive impairment.    01/22/2024    4:58 PM  MMSE - Mini Mental State Exam  Not completed: Unable to complete        Immunizations Immunization History  Administered Date(s) Administered   Fluad Quad(high Dose 65+) 06/20/2019   Fluad Trivalent(High Dose 65+) 07/03/2023   Hepatitis A, Ped/Adol-2 Dose 09/24/2023, 12/08/2023   Hepatitis B, PED/ADOLESCENT 09/24/2023, 12/08/2023   Influenza,inj,Quad PF,6+ Mos 05/25/2020, 05/16/2021, 07/04/2022   PFIZER(Purple Top)SARS-COV-2 Vaccination 10/25/2019, 11/18/2019, 06/25/2020   PNEUMOCOCCAL CONJUGATE-20 08/15/2021   Pneumococcal Conjugate (Pcv15) 09/24/2023   Pneumococcal Conjugate-13 09/03/2019   RSV,unspecified 07/05/2022   Td 09/24/2023   Tdap 09/03/2019   Zoster Recombinant(Shingrix) 09/24/2023    Screening Tests Health Maintenance  Topic Date Due   COVID-19 Vaccine (4 - 2024-25 season) 05/06/2023   Diabetic kidney evaluation - Urine ACR  10/04/2023   INFLUENZA VACCINE  04/04/2024   HEMOGLOBIN A1C  07/18/2024   Diabetic kidney evaluation - eGFR measurement  01/15/2025   FOOT  EXAM  01/15/2025   Medicare Annual Wellness (AWV)  01/21/2025   DTaP/Tdap/Td (3 - Td or Tdap) 09/23/2033   Pneumonia Vaccine 47+ Years old  Completed   Hepatitis C Screening  Completed   HPV VACCINES  Aged Out   Meningococcal B Vaccine  Aged Out   OPHTHALMOLOGY EXAM  Discontinued   Colonoscopy  Discontinued   Zoster Vaccines- Shingrix  Discontinued    Health Maintenance  Health Maintenance Due  Topic Date Due   COVID-19 Vaccine (4 - 2024-25 season) 05/06/2023   Diabetic kidney evaluation - Urine ACR  10/04/2023   Health Maintenance Items Addressed: Yes Patient aware of current care gaps.  Immunization record was verified by [facility] and updated in patient's chart.  Additional Screening:  Vision Screening: Recommended annual ophthalmology exams for early detection of glaucoma and other disorders of the eye.  Dental Screening: Recommended annual dental exams for proper oral hygiene  Community Resource Referral / Chronic Care Management: CRR required this visit?  Yes   CCM required this visit?  PCP informed of CCM need   Plan:    I have personally reviewed and noted the following in the patient's chart:   Medical and social history Use of alcohol, tobacco or illicit drugs  Current medications and supplements including opioid prescriptions. Patient is not currently taking opioid prescriptions. Functional ability and status Nutritional status Physical  activity Advanced directives List of other physicians Hospitalizations, surgeries, and ER visits in previous 12 months Vitals Screenings to include cognitive, depression, and falls Referrals and appointments  In addition, I have reviewed and discussed with patient certain preventive protocols, quality metrics, and best practice recommendations. A written personalized care plan for preventive services as well as general preventive health recommendations were provided to patient.   Margette Sheldon, LPN   0/27/2536    After Visit Summary: (MyChart) Due to this being a telephonic visit, the after visit summary with patients personalized plan was offered to patient via MyChart   Notes: Patient aware of current care gaps.  Immunization record was verified by [facility] and updated in patient's chart. Provider advised of CCM need.

## 2024-01-22 NOTE — Patient Instructions (Signed)
 [name] , Thank you for taking time out of your busy schedule to complete your Annual Wellness Visit with me. I enjoyed our conversation and look forward to speaking with you again next year. I, as well as your care team,  appreciate your ongoing commitment to your health goals. Please review the following plan we discussed and let me know if I can assist you in the future. Your Game plan/ To Do List    Referrals: If you haven't heard from the office you've been referred to, please reach out to them at the phone provided.   Follow up Visits: Next Medicare AWV with our clinical staff: 01/27/2025 at 11:50a PHONE VISIT with Nurse   Have you seen your provider in the last 6 months (3 months if uncontrolled diabetes)? Yes Next Office Visit with your provider: 07/21/2024 at 2:10p 6 month follow up with Dr. Adan Holms  Clinician Recommendations:  Aim for 30 minutes of exercise or brisk walking, 6-8 glasses of water, and 5 servings of fruits and vegetables each day.       This is a list of the screening recommended for you and due dates:  Health Maintenance  Topic Date Due   COVID-19 Vaccine (4 - 2024-25 season) 05/06/2023   Yearly kidney health urinalysis for diabetes  10/04/2023   Flu Shot  04/04/2024   Hemoglobin A1C  07/18/2024   Yearly kidney function blood test for diabetes  01/15/2025   Complete foot exam   01/15/2025   Medicare Annual Wellness Visit  01/21/2025   DTaP/Tdap/Td vaccine (3 - Td or Tdap) 09/23/2033   Pneumonia Vaccine  Completed   Hepatitis C Screening  Completed   HPV Vaccine  Aged Out   Meningitis B Vaccine  Aged Out   Eye exam for diabetics  Discontinued   Colon Cancer Screening  Discontinued   Zoster (Shingles) Vaccine  Discontinued    Advanced directives: (Declined) Advance directive discussed with you today. Even though you declined this today, please call our office should you change your mind, and we can give you the proper paperwork for you to fill out. Advance  Care Planning is important because it:  [x]  Makes sure you receive the medical care that is consistent with your values, goals, and preferences  [x]  It provides guidance to your family and loved ones and reduces their decisional burden about whether or not they are making the right decisions based on your wishes.  Follow the link provided in your after visit summary or read over the paperwork we have mailed to you to help you started getting your Advance Directives in place. If you need assistance in completing these, please reach out to us  so that we can help you!  See attachments for Preventive Care and Fall Prevention Tips.

## 2024-01-31 ENCOUNTER — Other Ambulatory Visit: Payer: Self-pay

## 2024-01-31 NOTE — Patient Outreach (Signed)
 Complex Care Management   Visit Note  01/31/2024  Name:  Joe FULLILOVE Sr. MRN: [id number] DOB: [birth year]  Situation: Referral received for Complex Care Management related to Respite services I obtained verbal consent from Caregiver.  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  She expects a response by next week.  SW scheduled a follow up.   SDOH Interventions    Flowsheet Row Clinical Support from 01/22/2024 in [facility] - A Dept Of [place]. [facility] Clinical Support from 12/06/2022 in [facility] - A Dept Of Tommas Fragmin. [facility] Telephone from 05/30/2022 in [facility] Community Care Coordination  SDOH Interventions     Food Insecurity Interventions Intervention Not Indicated Intervention Not Indicated Intervention Not Indicated  Housing Interventions Intervention Not Indicated Intervention Not Indicated --  Transportation Interventions Intervention Not Indicated Intervention Not Indicated Intervention Not Indicated  Utilities Interventions [id number] Referral Intervention Not Indicated --  Alcohol Usage Interventions Intervention Not Indicated (Score <7) -- --  Financial Strain Interventions [id number] Referral Intervention Not Indicated --  Physical Activity Interventions Intervention Not Indicated Patient Refused, Other  (Comments) --  Stress Interventions Intervention Not Indicated Intervention Not Indicated --  Social Connections Interventions Intervention Not Indicated -- --  Health Literacy Interventions Intervention Not Indicated -- --         Recommendation:   No recommendations at this time.  Follow Up Plan:   Telephone follow up appointment date/time:  02/14/24 at 1pm.  SIG Dallis Dues, BSW [place]  [facility], [facility] Social Worker Direct Dial: [phone]  Fax: [phone] Website: [email or web address]

## 2024-01-31 NOTE — Patient Instructions (Signed)
 Visit Information  Thank you for taking time to visit with me today. Please don't hesitate to contact me if I can be of assistance to you before our next scheduled appointment.  Your next care management appointment is by telephone on 02/14/24 at 1pm   Please call the care guide team at [phone] if you need to cancel, schedule, or reschedule an appointment.   Please call 911 if you are experiencing a Mental Health or Behavioral Health Crisis or need someone to talk to.  Dallis Dues, BSW [place]  [facility], [facility] Social Worker Direct Dial: [phone]  Fax: [phone] Website: [email or web address]

## 2024-02-14 ENCOUNTER — Other Ambulatory Visit: Payer: Self-pay

## 2024-02-14 NOTE — Patient Instructions (Signed)
 Visit Information  Thank you for taking time to visit with me today. Please don't hesitate to contact me if I can be of assistance to you before our next scheduled appointment.  Your next care management appointment is by telephone on 02/15/24 at 3pm   Please call the care guide team at [phone] if you need to cancel, schedule, or reschedule an appointment.   Please call 911 if you are experiencing a Mental Health or Behavioral Health Crisis or need someone to talk to.  Joe Perry, BSW [place]  [facility], [facility] Social Worker Direct Dial: [phone]  Fax: [phone] Website: [email or web address]

## 2024-02-14 NOTE — Patient Outreach (Signed)
 Complex Care Management   Visit Note  02/14/2024  Name:  Joe HINDERMAN Sr. MRN: [id number] DOB: Nov 09, 1950  Situation: Referral received for Complex Care Management related to Respite I obtained verbal consent from Caregiver.  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  SW t/c DSS, with Mrs. Cassara.  After 30 minutes on hold, the call rings and is hung up.  Patient agreed to visit DSS by tomorrow to inquire on the program.  SW schedules a follow up.  SDOH Interventions    Flowsheet Row Clinical Support from 01/22/2024 in [facility] - A Dept Of [place]. [facility] Clinical Support from 12/06/2022 in [facility] - A Dept Of Tommas Fragmin. [facility] Telephone from 05/30/2022 in [facility] Community Care Coordination  SDOH Interventions     Food Insecurity Interventions Intervention Not Indicated Intervention Not Indicated Intervention Not Indicated  Housing Interventions Intervention Not Indicated Intervention Not Indicated --  Transportation Interventions Intervention Not Indicated Intervention Not Indicated Intervention Not Indicated  Utilities Interventions [id number] Referral Intervention Not Indicated --  Alcohol Usage Interventions Intervention Not Indicated (Score <7) -- --  Financial Strain Interventions [id number] Referral Intervention Not Indicated  --  Physical Activity Interventions Intervention Not Indicated Patient Refused, Other (Comments) --  Stress Interventions Intervention Not Indicated Intervention Not Indicated --  Social Connections Interventions Intervention Not Indicated -- --  Health Literacy Interventions Intervention Not Indicated -- --      Recommendation:   None  Follow Up Plan:   Telephone follow up appointment date/time:  02/15/24 at 3pm.  Dallis Dues, BSW [place]  [facility], [facility] Social Worker Direct Dial: [phone]  Fax: [phone] Website: [email or web address]

## 2024-02-15 ENCOUNTER — Other Ambulatory Visit: Payer: Self-pay

## 2024-02-15 NOTE — Patient Instructions (Signed)
 Visit Information  Thank you for taking time to visit with me today. Please don't hesitate to contact me if I can be of assistance to you before our next scheduled appointment.  Your next care management appointment is by telephone on 02/20/24 at 11am   Please call the care guide team at [phone] if you need to cancel, schedule, or reschedule an appointment.   Please call 911 if you are experiencing a Mental Health or Behavioral Health Crisis or need someone to talk to.  Dallis Dues, BSW [place]  [facility], [facility] Social Worker Direct Dial: [phone]  Fax: [phone] Website: [email or web address]

## 2024-02-15 NOTE — Patient Outreach (Addendum)
 Complex Care Management   Visit Note  02/15/2024  Name:  Joe SCHWEIGERT Sr. MRN: [id number] DOB: Sep 13, 1950  Situation: Referral received for Complex Care Management related to Repite I obtained verbal consent from Patient.  Visit completed with patient  on the phone  Background:   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 ([name])    Diabetes mellitus without complication ([name])    Dysphagia    post stroke   Hypertension    Left upper lobe pneumonia 02/15/2021   Oxygen deficiency    Stroke [facility])     Assessment:  Patient reports she went to DSS and spoke to staff.  Patient was asked to returnn on the 18th at 11am and she will conference SW during the visit to receive clarification on the services for Respite.  SDOH Interventions    Flowsheet Row Clinical Support from 01/22/2024 in [facility] - A Dept Of [place]. [facility] Clinical Support from 12/06/2022 in [facility] [place] - A Dept Of Tommas Fragmin. [facility] Telephone from 05/30/2022 in [facility] Community Care Coordination  SDOH Interventions     Food Insecurity Interventions Intervention Not Indicated Intervention Not Indicated Intervention Not Indicated  Housing Interventions Intervention Not Indicated Intervention Not Indicated --  Transportation Interventions Intervention Not Indicated Intervention Not Indicated Intervention Not Indicated  Utilities Interventions [id number] Referral Intervention Not Indicated --  Alcohol Usage Interventions Intervention Not Indicated (Score <7) -- --  Financial Strain Interventions [id number] Referral Intervention Not Indicated --  Physical Activity Interventions Intervention Not Indicated Patient Refused, Other  (Comments) --  Stress Interventions Intervention Not Indicated Intervention Not Indicated --  Social Connections Interventions Intervention Not Indicated -- --  Health Literacy Interventions Intervention Not Indicated -- --      Recommendation:   none  Follow Up Plan:   Telephone follow up appointment date/time:  02/20/24 11am  [facility]  [facility], [facility] Social Worker Direct Dial: [phone]  Fax: [phone] Website: [email or web address]

## 2024-02-20 ENCOUNTER — Other Ambulatory Visit

## 2024-02-20 NOTE — Patient Outreach (Signed)
 Complex Care Management   Visit Note  02/20/2024  Name:  Joe HARIG Sr. MRN: [id number] DOB: August 18, 1951  Situation: Referral received for Complex Care Management related to SDOH Barriers:  Respite I obtained verbal consent from Caregiver.  Visit completed with caregiver/wife Joe Perry  on the phone  Background:   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 ([name])    Diabetes mellitus without complication ([name])    Dysphagia    post stroke   Hypertension    Left upper lobe pneumonia 02/15/2021   Oxygen deficiency    Stroke [facility])     Assessment:  Patient reports that she received a call from DSS and was directed to [facility].  SSA agreed to call on Saturday but patients wife is not sure why.  SW t/c DSS and spoke to staff who reports there is no application for In Home Aide or any other service on file.  Staff suggests that patient wife could have been directed to CAP with Public Health.  DSS transferred to Intake Staff Perfecto Bracket and SW left a voicemail.  SW t/c Public Health CAP and left a Engineer, technical sales for [employer].  Patient reports that she often does not understand English and has not asked for an interpreter.  SW educated patient on free interpreter services with DSS.  SDOH Interventions    Flowsheet Row Clinical Support from 01/22/2024 in [facility] [place] - A Dept Of [place]. [facility] Clinical Support from 12/06/2022 in [facility] [place] - A Dept Of Tommas Fragmin. [facility] Telephone from 05/30/2022 in [facility] Community Care Coordination  SDOH Interventions     Food Insecurity Interventions Intervention Not Indicated Intervention Not Indicated Intervention Not Indicated  Housing Interventions Intervention Not  Indicated Intervention Not Indicated --  Transportation Interventions Intervention Not Indicated Intervention Not Indicated Intervention Not Indicated  Utilities Interventions [id number] Referral Intervention Not Indicated --  Alcohol Usage Interventions Intervention Not Indicated (Score <7) -- --  Financial Strain Interventions [id number] Referral Intervention Not Indicated --  Physical Activity Interventions Intervention Not Indicated Patient Refused, Other (Comments) --  Stress Interventions Intervention Not Indicated Intervention Not Indicated --  Social Connections Interventions Intervention Not Indicated -- --  Health Literacy Interventions Intervention Not Indicated -- --    Recommendation:   none  Follow Up Plan:   Telephone follow up appointment date/time:  02/25/24 at 1pm  Dallis Dues, BSW [place]  [facility], [facility] Social Worker Direct Dial: [phone]  Fax: [phone] Website: [email or web address]

## 2024-02-20 NOTE — Patient Instructions (Signed)
 Visit Information  Thank you for taking time to visit with me today. Please don't hesitate to contact me if I can be of assistance to you before our next scheduled appointment.  Your next care management appointment is by telephone on 02/25/24 at 1pm   Please call the care guide team at [phone] if you need to cancel, schedule, or reschedule an appointment.   Please call 911 if you are experiencing a Mental Health or Behavioral Health Crisis or need someone to talk to.  Dallis Dues, BSW [place]  [facility], [facility] Social Worker Direct Dial: [phone]  Fax: [phone] Website: [email or web address]

## 2024-02-25 ENCOUNTER — Other Ambulatory Visit: Payer: Self-pay

## 2024-02-25 NOTE — Patient Instructions (Signed)
 Visit Information  Thank you for taking time to visit with me today. Please don't hesitate to contact me if I can be of assistance to you before our next scheduled appointment.  Your next care management appointment is by telephone on 02/28/24 at 2pm   Please call the care guide team at [phone] if you need to cancel, schedule, or reschedule an appointment.   Please call 911 if you are experiencing a Mental Health or Behavioral Health Crisis or need someone to talk to.  Tillman Gardener, BSW [place]  [facility], [facility] Social Worker Direct Dial: [phone]  Fax: [phone] Website: [email or web address]

## 2024-02-25 NOTE — Patient Outreach (Signed)
 Complex Care Management   Visit Note  02/25/2024  Name:  Joe CERONE Sr. MRN: [id number] DOB: 1951-04-28  Situation: Referral received for Complex Care Management related to In Home Aide I obtained verbal consent from Caregiver.  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 Patient did not receive a return call from DSS on In Home Aide waiting list.  SW t/c DSS and after a very long hold, the call was disconnected.  Patient is provided the contact number to DSS and instructed to call again to request Adult Services Intake for In Home Aide.  SDOH Interventions    Flowsheet Row Clinical Support from 01/22/2024 in [facility] [place] - A Dept Of [place]. [facility] Clinical Support from 12/06/2022 in [facility] [place] - A Dept Of Jolynn DEL. [facility] Telephone from 05/30/2022 in [facility] Community Care Coordination  SDOH Interventions     Food Insecurity Interventions Intervention Not Indicated Intervention Not Indicated Intervention Not Indicated  Housing Interventions Intervention Not Indicated Intervention Not Indicated --  Transportation Interventions Intervention Not Indicated Intervention Not Indicated Intervention Not Indicated  Utilities Interventions [id number] Referral Intervention Not Indicated  --  Alcohol Usage Interventions Intervention Not Indicated (Score <7) -- --  Financial Strain Interventions [id number] Referral Intervention Not Indicated --  Physical Activity Interventions Intervention Not Indicated Patient Refused, Other (Comments) --  Stress Interventions Intervention Not Indicated Intervention Not Indicated --  Social Connections Interventions Intervention Not Indicated -- --  Health Literacy Interventions Intervention Not Indicated -- --      Recommendation:   None  Follow Up Plan:   Telephone follow up appointment date/time:  02/28/24 at 2pm  Tillman Gardener, BSW [place]  [facility], [facility] Social Worker Direct Dial: [phone]  Fax: [phone] Website: [email or web address]

## 2024-02-28 ENCOUNTER — Other Ambulatory Visit: Payer: Self-pay

## 2024-02-28 NOTE — Patient Instructions (Signed)
 Visit Information  Thank you for taking time to visit with me today. Please don't hesitate to contact me if I can be of assistance to you before our next scheduled appointment.  Your next care management appointment is by telephone on 02/29/24 at 2pm   Please call the care guide team at [phone] if you need to cancel, schedule, or reschedule an appointment.   Please call 911 if you are experiencing a Mental Health or Behavioral Health Crisis or need someone to talk to.  Tillman Gardener, BSW [place]  [facility], [facility] Social Worker Direct Dial: [phone]  Fax: [phone] Website: [email or web address]

## 2024-02-28 NOTE — Patient Outreach (Signed)
 Complex Care Management   Visit Note  02/28/2024  Name:  Joe WORLEY Sr. MRN: [id number] DOB: [birth year]  Situation: Referral received for Complex Care Management related to In Home Aide DSS I obtained verbal consent from Patient.  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  Patients wife plans to go to DSS on 02/29/24 to apply for In Home Aide.  SDOH Interventions    Flowsheet Row Clinical Support from 01/22/2024 in [facility] - A Dept Of [place]. [facility] Clinical Support from 12/06/2022 in [facility] - A Dept Of Jolynn DEL. [facility] Telephone from 05/30/2022 in [facility] Community Care Coordination  SDOH Interventions     Food Insecurity Interventions Intervention Not Indicated Intervention Not Indicated Intervention Not Indicated  Housing Interventions Intervention Not Indicated Intervention Not Indicated --  Transportation Interventions Intervention Not Indicated Intervention Not Indicated Intervention Not Indicated  Utilities Interventions [id number] Referral Intervention Not Indicated --  Alcohol Usage Interventions Intervention Not Indicated (Score <7) -- --  Financial Strain Interventions [id number] Referral Intervention Not Indicated --  Physical Activity Interventions Intervention Not Indicated Patient Refused, Other (Comments) --   Stress Interventions Intervention Not Indicated Intervention Not Indicated --  Social Connections Interventions Intervention Not Indicated -- --  Health Literacy Interventions Intervention Not Indicated -- --    Recommendation:   None  Follow Up Plan:   Telephone follow up appointment date/time:  02/29/24 at 2pm  Tillman Gardener, BSW [place]  [facility], [facility] Social Worker Direct Dial: [phone]  Fax: [phone] Website: [email or web address]

## 2024-02-29 ENCOUNTER — Other Ambulatory Visit: Payer: Self-pay

## 2024-02-29 NOTE — Patient Instructions (Signed)

## 2024-02-29 NOTE — Patient Outreach (Addendum)
 Complex Care Management   Visit Note  02/29/2024  Name:  Joe Perry Sr. MRN: [id number] DOB: [birth year]  Situation: Referral received for Complex Care Management related to SDOH Barriers:  In Home Aide I obtained verbal consent from Caregiver.  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  Patients wife is redirected to Adult Services and staff completed the paperwork to have patient added to the waiting list. Patient will need to follow up with Levorn Sauers to check on the status [id number] but there is no set timeframe for the waiting list.  Patient is thankful for the support and does not need a follow up.  SDOH Interventions    Flowsheet Row Clinical Support from 01/22/2024 in [facility] [place] - A Dept Of [place]. [facility] Clinical Support from 12/06/2022 in [facility] [place] - A Dept Of Jolynn DEL. [facility] Telephone from 05/30/2022 in [facility] Community Care Coordination  SDOH Interventions     Food Insecurity Interventions Intervention Not Indicated Intervention Not Indicated Intervention Not Indicated  Housing Interventions Intervention Not Indicated Intervention Not Indicated --  Transportation Interventions Intervention Not Indicated Intervention Not Indicated Intervention Not Indicated  Utilities Interventions [id number] Referral Intervention  Not Indicated --  Alcohol Usage Interventions Intervention Not Indicated (Score <7) -- --  Financial Strain Interventions [id number] Referral Intervention Not Indicated --  Physical Activity Interventions Intervention Not Indicated Patient Refused, Other (Comments) --  Stress Interventions Intervention Not Indicated Intervention Not Indicated --  Social Connections Interventions Intervention Not Indicated -- --  Health Literacy Interventions Intervention Not Indicated -- --      Recommendation:   none  Follow Up Plan:   Patient has met all care management goals. Care Management case will be closed. Patient has been provided contact information should new needs arise.   Tillman Gardener, BSW [place]  [facility], [facility] Social Worker Direct Dial: [phone]  Fax: [phone] Website: [email or web address]

## 2024-03-01 ENCOUNTER — Encounter (HOSPITAL_COMMUNITY): Payer: Self-pay | Admitting: Interventional Radiology

## 2024-03-01 ENCOUNTER — Other Ambulatory Visit: Payer: Self-pay | Admitting: Family Medicine

## 2024-03-02 ENCOUNTER — Other Ambulatory Visit: Payer: Self-pay | Admitting: Family Medicine

## 2024-03-02 DIAGNOSIS — R058 Other specified cough: Secondary | ICD-10-CM

## 2024-03-04 ENCOUNTER — Telehealth: Payer: Self-pay | Admitting: Family Medicine

## 2024-03-04 MED ORDER — TRAZODONE HCL 50 MG PO TABS: 50.0000 mg | ORAL_TABLET | Freq: Every evening | ORAL | 1 refills | Status: DC | PRN

## 2024-03-04 NOTE — Telephone Encounter (Signed)
 Requested medication not seen on patient med profile. Please advise

## 2024-03-04 NOTE — Telephone Encounter (Unsigned)
 Copied from CRM [id number]. Topic: Clinical - Medication Refill >> Mar 04, 2024 11:19 AM Tiffini S wrote:  Communication Reason for CRM: call dropped when speaking with wife Ayistat        ----------------------------------------------------------------------- Patient wife calling requesting refill medication  Medication: traZODone  (DESYREL ) 50 MG tablet [Pharmacy Med Name: traZODone  HCl 50 MG Oral  Has the patient contacted their pharmacy? Yes Pharmacy stated to call provider  This is the patient's preferred pharmacy: [id number] - [place] - [address] [place] [address] Phone: [phone] Fax: [phone]  Is this the correct pharmacy for this prescription? Yes   Has the prescription been filled recently? No  Is the patient out of the medication? Yes, the patient is taking the last table today   Has the patient been seen for an appointment in the last year OR does the patient have an upcoming appointment? Yes  Can we respond through MyChart? No, patient spouse asked for phone call   Agent: Please be advised that Rx refills may take up to 3 business days. We ask that you follow-up with your pharmacy.

## 2024-03-04 NOTE — Telephone Encounter (Signed)
 Refilled

## 2024-03-05 NOTE — Telephone Encounter (Signed)
Patient aware refill has been sent 

## 2024-05-21 ENCOUNTER — Other Ambulatory Visit: Payer: Self-pay | Admitting: Family Medicine

## 2024-05-21 DIAGNOSIS — G8194 Hemiplegia, unspecified affecting left nondominant side: Secondary | ICD-10-CM

## 2024-06-25 NOTE — Therapy (Incomplete)
 OUTPATIENT PHYSICAL THERAPY NEURO EVALUATION   Patient Name: Joe LEPERA Sr. 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)    Diabetes mellitus without complication ([name])    Dysphagia    post stroke   Hypertension    Left upper lobe pneumonia 02/15/2021   Oxygen deficiency    Stroke [facility])    Past Surgical History:  Procedure Laterality Date   IR ANGIO INTRA EXTRACRAN SEL COM CAROTID INNOMINATE UNI L MOD SED  03/10/2019   IR ANGIO VERTEBRAL SEL SUBCLAVIAN INNOMINATE BILAT MOD SED  03/10/2019   IR CT HEAD LTD  03/10/2019   IR PERCUTANEOUS ART THROMBECTOMY/INFUSION INTRACRANIAL INC DIAG ANGIO  03/10/2019   RADIOLOGY WITH ANESTHESIA N/A 03/10/2019   Procedure: IR WITH ANESTHESIA/CODE STROKE;  Surgeon: Radiologist, Medication, MD;  Location: [facility] OR;  Service: Radiology;  Laterality: N/A;   Patient Active Problem List   Diagnosis Date Noted   Obesity (BMI 30.0-34.9) 03/27/2022   Left hemiparesis ([name]) 12/06/2020   Benign essential HTN    Cognitive deficit, post-stroke    Controlled type 2 diabetes mellitus with hyperglycemia, without long-term current use of insulin  ([name])    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  Rationale for Evaluation and Treatment: Rehabilitation  SUBJECTIVE:                                                                                                                                                                                             SUBJECTIVE STATEMENT: *** Pt accompanied by: {accompnied:27141}  PERTINENT HISTORY: ***Last noted office visit 01/16/24 for medical management of chronic issues.  PMH includes DM type 2, htn, HLD, insomnia, CVA with residual hemiparesis, aphasia, dysphagia, PNA, thrombectomy.  New MD visit 07/21/24.  PAIN:  Are you having pain? {OPRCPAIN:27236}  PRECAUTIONS: {Therapy precautions:24002}  RED FLAGS: {PT Red  Flags:29287}   WEIGHT BEARING RESTRICTIONS: {Yes ***/No:24003}  FALLS: Has patient fallen in last 6 months? {fallsyesno:27318}  LIVING ENVIRONMENT: Lives with: {OPRC lives with:25569::lives with their family} Lives in: {Lives in:25570} Stairs: {opstairs:27293} Has following equipment at home: {Assistive devices:23999}  PLOF: {PLOF:24004}Uses walker at home and occasionally w/c for longer distances.  PATIENT GOALS: ***  OBJECTIVE:  Note: Objective measures were completed at Evaluation unless otherwise noted.  DIAGNOSTIC FINDINGS: ***  COGNITION: Overall cognitive status: {cognition:24006}   SENSATION: {sensation:27233}  COORDINATION: ***  EDEMA:  {edema:24020}  MUSCLE TONE: {LE tone:25568}  MUSCLE LENGTH: Hamstrings: Right *** deg; Left *** deg Debby test: Right *** deg; Left *** deg  DTRs:  {DTR SITE:24025}  POSTURE: {posture:25561}  LOWER EXTREMITY ROM:     {AROM/PROM:27142}  Right Eval Left Eval  Hip flexion    Hip extension    Hip abduction    Hip adduction    Hip internal rotation    Hip external rotation    Knee flexion    Knee extension    Ankle dorsiflexion    Ankle plantarflexion    Ankle  inversion    Ankle eversion     (Blank rows = not tested)  LOWER EXTREMITY MMT:    MMT Right Eval Left Eval  Hip flexion    Hip extension    Hip abduction    Hip adduction    Hip internal rotation    Hip external rotation    Knee flexion    Knee extension    Ankle dorsiflexion    Ankle plantarflexion    Ankle inversion    Ankle eversion    (Blank rows = not tested)  BED MOBILITY:  {bed mobility:32615:p}  TRANSFERS: {transfers eval:32620}  GAIT: Findings: {GaitneuroPT:32644::Distance walked: ***,Comments: ***}  FUNCTIONAL TESTS:  {Functional tests:24029}  PATIENT SURVEYS:  {rehab surveys:24030}                                                                                                                              TREATMENT DATE: ***    PATIENT EDUCATION: Education details: *** Person educated: {Person educated:25204} Education method: {Education Method:25205} Education comprehension: {Education Comprehension:25206}  HOME EXERCISE PROGRAM: ***  GOALS: Goals reviewed with patient? Yes  SHORT TERM GOALS: Target date: ***  *** Baseline: Goal status: INITIAL  2.  *** Baseline:  Goal status: INITIAL  3.  *** Baseline:  Goal status: INITIAL  4.  *** Baseline:  Goal status: INITIAL  5.  *** Baseline:  Goal status: INITIAL  6.  *** Baseline:  Goal status: INITIAL  LONG TERM GOALS: Target date: ***  *** Baseline:  Goal status: INITIAL  2.  *** Baseline:  Goal status: INITIAL  3.  *** Baseline:  Goal status: INITIAL  4.  *** Baseline:  Goal status: INITIAL  5.  *** Baseline:  Goal status: INITIAL  6.  *** Baseline:  Goal status: INITIAL  ASSESSMENT:  CLINICAL IMPRESSION: Patient is  a 73 y.o. male who was seen today for physical therapy evaluation and treatment for ***.  Patient presents with ***. These impairments are limiting patient from ***.  Evaluation included the following assessment tools: ***.   Patient  will benefit from skilled PT to address noted impairments, improve overall function, and progress towards long term goals.  OBJECTIVE IMPAIRMENTS: {opptimpairments:25111}.   ACTIVITY LIMITATIONS: {activitylimitations:27494}  PARTICIPATION LIMITATIONS: {participationrestrictions:25113}  PERSONAL FACTORS: {Personal factors:25162} are also affecting patient's functional outcome.   REHAB POTENTIAL: {rehabpotential:25112}  CLINICAL DECISION MAKING: {clinical decision making:25114}  EVALUATION COMPLEXITY: {Evaluation complexity:25115}  PLAN:  PT FREQUENCY: {rehab frequency:25116}  PT DURATION: {rehab duration:25117}  PLANNED INTERVENTIONS: {rehab planned interventions:25118::97110-Therapeutic exercises,97530- Therapeutic [phone]- Neuromuscular re-education,97535- Self Rjmz,02859- Manual therapy,Patient/Family education}  PLAN FOR NEXT SESSION: ***   Damien Caulk, PT 06/25/2024, 12:57 PM

## 2024-06-26 ENCOUNTER — Ambulatory Visit: Attending: Family Medicine | Admitting: Physical Therapy

## 2024-07-02 ENCOUNTER — Ambulatory Visit: Admitting: Physical Therapy

## 2024-07-02 DIAGNOSIS — M6281 Muscle weakness (generalized): Secondary | ICD-10-CM

## 2024-07-02 DIAGNOSIS — R2681 Unsteadiness on feet: Secondary | ICD-10-CM

## 2024-07-02 DIAGNOSIS — R278 Other lack of coordination: Secondary | ICD-10-CM

## 2024-07-02 DIAGNOSIS — R2689 Other abnormalities of gait and mobility: Secondary | ICD-10-CM

## 2024-07-02 NOTE — Therapy (Signed)
 [facility] [address] [place], [place], [address] Phone: [phone]   Fax:  [phone]  Patient Details  Name: Joe ARRIAGA Sr. MRN: [id number] Date of Birth: 1951/07/02 Referring Provider:  Delbert Clam, MD  Encounter Date: 07/02/2024   PT End of Session - 07/02/24 1322     Visit Number 1    Authorization Type [employer] Medicare    PT Start Time 1318    PT Stop Time 1351    PT Time Calculation (min) 33 min    Equipment Utilized During Treatment --    Activity Tolerance Patient tolerated treatment well    Behavior During Therapy Restless          Pt presents w/wife, using RW and ambulating very slowly into clinic. Wife reports that pt is not moving much at home and wants him to do more at home. Wife states she did not want to come back to treatment room during eval with pt because he will not speak if she is around. He talks when he wants to. Wife also reports that pt will move more if he thinks wife is not around. Was taking pt to the [facility], but pt has not been in about 18mo. Informed wife that bringing him to PT will not make him move at home and recommended they ask about neuropysch or behavioral therapy. Pt has history of poor carryover w/rehab and informed wife they could also try HHPT. Reprinted pt's HEP from previous POC (see below) and instructed pt to perform them at home and recommended he perform during commercial breaks. Pt shook his head yes to this but otherwise non-verbal during evaluation. Pt ambulated out of clinic w/RW and mid-way out of clinic, placed his R hand on front bar of RW over the unlocking mechanism, which wife reports she has cued him not to do repeatedly but he continues to put hand there. When therapist asked pt about hand placement, pt corrected his hand, so suspect strong behavioral component that would be better addressed by psych professional.   Access Code: [email or web address] URL:  [email or web address] Date: 07/02/2024 Prepared by: Marlon Kurk Corniel  Program Notes You Can Walk For A Certain Length Of Time Each Day                        Walk 1 minute 2 times per day.            Increase 1  minute every 4 days             Work up to 5 minutes (1-2 times per day)  Exercises - Sit to Stand with Counter Support  - 1 x daily - 7 x weekly - 3 sets - 10 reps - Seated Long Arc Quad  - 1 x daily - 7 x weekly - 3 sets - 10 reps - Seated Heel Toe Raises  - 1 x daily - 7 x weekly - 3 sets - 10 reps    Marlon BRAVO Lianni Kanaan, PT, DPT 07/02/2024, 1:52 PM  [facility] [address] [place], [place], [address] Phone: [phone]   Fax:  [phone]

## 2024-07-07 ENCOUNTER — Other Ambulatory Visit: Payer: Self-pay | Admitting: Family Medicine

## 2024-07-07 DIAGNOSIS — E1159 Type 2 diabetes mellitus with other circulatory complications: Secondary | ICD-10-CM

## 2024-07-18 ENCOUNTER — Other Ambulatory Visit: Payer: Self-pay | Admitting: Family Medicine

## 2024-07-18 DIAGNOSIS — E1159 Type 2 diabetes mellitus with other circulatory complications: Secondary | ICD-10-CM

## 2024-07-21 ENCOUNTER — Ambulatory Visit: Attending: Family Medicine | Admitting: Family Medicine

## 2024-07-21 ENCOUNTER — Encounter: Payer: Self-pay | Admitting: Family Medicine

## 2024-07-21 VITALS — BP 111/59 | HR 79 | Ht 76.0 in

## 2024-07-21 DIAGNOSIS — E119 Type 2 diabetes mellitus without complications: Secondary | ICD-10-CM | POA: Diagnosis not present

## 2024-07-21 DIAGNOSIS — M25561 Pain in right knee: Secondary | ICD-10-CM | POA: Diagnosis not present

## 2024-07-21 DIAGNOSIS — G4700 Insomnia, unspecified: Secondary | ICD-10-CM

## 2024-07-21 DIAGNOSIS — E785 Hyperlipidemia, unspecified: Secondary | ICD-10-CM

## 2024-07-21 DIAGNOSIS — I10 Essential (primary) hypertension: Secondary | ICD-10-CM | POA: Diagnosis not present

## 2024-07-21 DIAGNOSIS — I69315 Cognitive social or emotional deficit following cerebral infarction: Secondary | ICD-10-CM

## 2024-07-21 DIAGNOSIS — Z7984 Long term (current) use of oral hypoglycemic drugs: Secondary | ICD-10-CM

## 2024-07-21 DIAGNOSIS — E1159 Type 2 diabetes mellitus with other circulatory complications: Secondary | ICD-10-CM

## 2024-07-21 DIAGNOSIS — I69319 Unspecified symptoms and signs involving cognitive functions following cerebral infarction: Secondary | ICD-10-CM

## 2024-07-21 DIAGNOSIS — I69351 Hemiplegia and hemiparesis following cerebral infarction affecting right dominant side: Secondary | ICD-10-CM

## 2024-07-21 DIAGNOSIS — M79661 Pain in right lower leg: Secondary | ICD-10-CM

## 2024-07-21 DIAGNOSIS — I152 Hypertension secondary to endocrine disorders: Secondary | ICD-10-CM

## 2024-07-21 DIAGNOSIS — G8929 Other chronic pain: Secondary | ICD-10-CM

## 2024-07-21 DIAGNOSIS — I69398 Other sequelae of cerebral infarction: Secondary | ICD-10-CM

## 2024-07-21 DIAGNOSIS — I6930 Unspecified sequelae of cerebral infarction: Secondary | ICD-10-CM

## 2024-07-21 LAB — POCT GLYCOSYLATED HEMOGLOBIN (HGB A1C): HbA1c, POC (controlled diabetic range): 6 % (ref 0.0–7.0)

## 2024-07-21 MED ORDER — TRAZODONE HCL 50 MG PO TABS: 50.0000 mg | ORAL_TABLET | Freq: Every evening | ORAL | 1 refills | Status: AC | PRN

## 2024-07-21 MED ORDER — METFORMIN HCL 500 MG PO TABS: 1000.0000 mg | ORAL_TABLET | Freq: Two times a day (BID) | ORAL | 1 refills | Status: AC

## 2024-07-21 MED ORDER — CHLORTHALIDONE 25 MG PO TABS: 25.0000 mg | ORAL_TABLET | Freq: Every day | ORAL | 1 refills | Status: AC

## 2024-07-21 MED ORDER — QUETIAPINE FUMARATE 100 MG PO TABS: 100.0000 mg | ORAL_TABLET | Freq: Every day | ORAL | 1 refills | Status: AC

## 2024-07-21 MED ORDER — DONEPEZIL HCL 10 MG PO TABS: 10.0000 mg | ORAL_TABLET | Freq: Every day | ORAL | 1 refills | Status: DC

## 2024-07-21 MED ORDER — ATORVASTATIN CALCIUM 40 MG PO TABS: ORAL_TABLET | ORAL | 1 refills | Status: AC

## 2024-07-21 MED ORDER — MISC. DEVICES MISC: 0 refills | Status: DC

## 2024-07-21 MED ORDER — AMLODIPINE BESYLATE 10 MG PO TABS: 10.0000 mg | ORAL_TABLET | Freq: Every day | ORAL | 1 refills | Status: AC

## 2024-07-21 MED ORDER — LOSARTAN POTASSIUM 25 MG PO TABS: 25.0000 mg | ORAL_TABLET | Freq: Every day | ORAL | 1 refills | Status: AC

## 2024-07-21 MED ORDER — DICLOFENAC SODIUM 1 % EX GEL: 4.0000 g | Freq: Four times a day (QID) | CUTANEOUS | 1 refills | Status: DC

## 2024-07-21 NOTE — Patient Instructions (Signed)
 VISIT SUMMARY:  Today, we discussed your right leg pain, which is mild and currently not present. We also reviewed your hypertension, diabetes, cholesterol, cognitive impairment, and sleep issues. Adjustments were made to your medications and a new physical therapy referral was provided.  YOUR PLAN:  -ESSENTIAL HYPERTENSION: Hypertension, or high blood pressure, is when the force of the blood against your artery walls is too high. Your blood pressure was low today, so we reduced your losartan  dose to 25 mg daily to prevent dizziness and falls.  -TYPE 2 DIABETES MELLITUS: Type 2 diabetes is a condition that affects the way your body processes blood sugar. Your A1c level is well-controlled at 6.0, so you should continue taking metformin  1000 mg twice daily.  -HYPERLIPIDEMIA: Hyperlipidemia means having high levels of fats (like cholesterol) in your blood. We have scheduled a fasting blood test for your cholesterol on November 18th, 2025.  -RIGHT KNEE AND LEG PAIN SECONDARY TO PRIOR STROKE: Your mild right leg pain is likely due to weakness from your previous stroke. You should apply Voltaren gel to the affected area and attend physical therapy as referred.  -COGNITIVE IMPAIRMENT SECONDARY TO PRIOR STROKE: Cognitive impairment means having trouble with memory and thinking skills. You should continue taking donepezil  10 mg at bedtime to help with these issues.  -INSOMNIA: Insomnia is difficulty falling or staying asleep. You should continue taking trazodone  50 mg and quetiapine  100 mg at bedtime to help with your sleep.  -GENERAL HEALTH MAINTENANCE: You received a flu shot at [facility] and were provided with a walker for mobility support.  INSTRUCTIONS:  Please follow up with the fasting blood test for cholesterol on November 18th, 2025. Attend your physical therapy sessions as referred. Continue taking your medications as prescribed and use the walker for mobility support.

## 2024-07-21 NOTE — Progress Notes (Signed)
 Subjective:  Patient ID: Joe KATHEE Stabile Sr., male    DOB: 1950-10-01  Age: 73 y.o. MRN: [id number]  CC: Medical Management of Chronic Issues (Pain in right leg/Need script for walker)     Discussed the use of AI scribe software for clinical note transcription with the patient, who gave verbal consent to proceed.  History of Present Illness Joe HUCKABA Sr. is a 73 year old male with  a history of type 2 diabetes mellitus, hypertension, hyperlipidemia, insomnia, CVA with residual right hemiparesis, aphasia.  who presents with right knee pain.  He experiences mild pain in the right leg from the knee downwards, which is currently absent. He has not taken any medication for this pain. There is no numbness or pain in the left leg.  Of note he has right hemiparesis from his stroke.  Hypertension is managed with losartan , and atorvastatin  is used for cholesterol management. Diabetes is controlled with metformin , with a recent A1c of 6.0, improved from 6.4.  He has dementia following a stroke and takes Aricept . Trazodone  and Seroquel  are used for sleep disturbances, with difficulty sleeping and early morning awakenings. No agitation or restlessness is present.  Per his spouse he needs to be on both Seroquel  and trazodone  for symptom control. Most of the history is obtained from the patient surprisingly which is unusual for him as at previous visit his wife provides most of the information and he is usually silent.    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 ([name])    Diabetes mellitus without complication ([name])    Dysphagia    post stroke   Hypertension    Left upper lobe pneumonia 02/15/2021   Oxygen deficiency    Stroke [facility])     Past Surgical History:  Procedure  Laterality Date   IR ANGIO INTRA EXTRACRAN SEL COM CAROTID INNOMINATE UNI L MOD SED  03/10/2019   IR ANGIO VERTEBRAL SEL SUBCLAVIAN INNOMINATE BILAT MOD SED  03/10/2019   IR CT HEAD LTD  03/10/2019   IR PERCUTANEOUS ART THROMBECTOMY/INFUSION INTRACRANIAL INC DIAG ANGIO  03/10/2019   RADIOLOGY WITH ANESTHESIA N/A 03/10/2019   Procedure: IR WITH ANESTHESIA/CODE STROKE;  Surgeon: Radiologist, Medication, MD;  Location: [facility] OR;  Service: Radiology;  Laterality: N/A;    Family History  Problem Relation Age of Onset   Diabetes Other    Hypertension Other    Healthy Mother    Healthy Father     Social History   Socioeconomic History   Marital status: Married    Spouse name: [name]   Number of children: Not on file   Years of education: Not on file   Highest education level: Not on file  Occupational History   Not on file  Tobacco Use   Smoking status: Never   Smokeless tobacco: Never  Vaping Use   Vaping status: Never Used  Substance and Sexual Activity   Alcohol use: No   Drug use: No   Sexual activity: Not Currently  Other Topics Concern   Not on file  Social History Narrative   12/16/19 Lives with wife   Social Drivers of Health   Financial Resource Strain: High Risk (01/22/2024)   Overall Financial Resource Strain (CARDIA)    Difficulty of Paying Living Expenses: Very hard  Food Insecurity: No Food  Insecurity (01/22/2024)   Hunger Vital Sign    Worried About Running Out of Food in the Last Year: Never true    Ran Out of Food in the Last Year: Never true  Transportation Needs: No Transportation Needs (01/22/2024)   PRAPARE - Administrator, Civil Service (Medical): No    Lack of Transportation (Non-Medical): No  Physical Activity: Sufficiently Active (01/22/2024)   Exercise Vital Sign    Days of Exercise per Week: 4 days    Minutes of Exercise per Session: 40 min  Stress: No Stress Concern Present (01/22/2024)   [employer] of Occupational Health - Occupational  Stress Questionnaire    Feeling of Stress : Not at all  Social Connections: Moderately Integrated (01/22/2024)   Social Connection and Isolation Panel    Frequency of Communication with Friends and Family: Once a week    Frequency of Social Gatherings with Friends and Family: Once a week    Attends Religious Services: 1 to 4 times per year    Active Member of [employer] or Organizations: Yes    Attends Banker Meetings: 1 to 4 times per year    Marital Status: Married    No Known Allergies  Outpatient Medications Prior to Visit  Medication Sig Dispense Refill   acetaminophen  (TYLENOL ) 325 MG tablet Take 2 tablets (650 mg total) by mouth every 4 (four) hours as needed for mild pain (or temp > 37.5 C (99.5 F)).     aspirin  EC 81 MG tablet Take 1 tablet (81 mg total) by mouth daily. 100 tablet 3   azelastine  (ASTELIN ) 0.1 % nasal spray Place 2 sprays into both nostrils 2 (two) times daily. Use in each nostril as directed 30 mL 12   cetirizine  (ZYRTEC ) 10 MG tablet Take 1 tablet by mouth once daily 90 tablet 1   Misc. Devices MISC [place]. Diagnosis - CVA 1 each 0   amLODipine  (NORVASC ) 10 MG tablet Take 1 tablet (10 mg total) by mouth daily. 90 tablet 1   atorvastatin  (LIPITOR) 40 MG tablet TAKE 1 TABLET BY MOUTH IN THE EVENING TO LOWER CHOLESTEROL. 90 tablet 1   chlorthalidone  (HYGROTON ) 25 MG tablet Take 1 tablet by mouth once daily 90 tablet 0   donepezil  (ARICEPT ) 10 MG tablet Take 1 tablet (10 mg total) by mouth at bedtime. 90 tablet 1   losartan  (COZAAR ) 50 MG tablet 1 tab(s) orally once a day for 30 day(s) 90 tablet 1   metFORMIN  (GLUCOPHAGE ) 500 MG tablet TAKE 2 TABLETS BY MOUTH TWICE DAILY WITH MEALS 120 tablet 0   QUEtiapine  (SEROQUEL ) 100 MG tablet Take 1 tablet (100 mg total) by mouth at bedtime. 90 tablet 1   traZODone  (DESYREL ) 50 MG tablet Take 1 tablet (50 mg total) by mouth at bedtime as needed for sleep. 90 tablet 1   Misc. Devices MISC 2 wheeled walker.  Diagnosis  - Stroke (Patient not taking: Reported on 07/21/2024) 1 each 0   No facility-administered medications prior to visit.     ROS Review of Systems  Constitutional:  Negative for activity change and appetite change.  HENT:  Negative for sinus pressure and sore throat.   Respiratory:  Negative for chest tightness, shortness of breath and wheezing.   Cardiovascular:  Negative for chest pain and palpitations.  Gastrointestinal:  Negative for abdominal distention, abdominal pain and constipation.  Genitourinary: Negative.   Musculoskeletal:        See HPI  Psychiatric/Behavioral:  Negative  for behavioral problems and dysphoric mood.     Objective:  BP (!) 111/59   Pulse 79   Ht 6' 4 (1.93 m)   SpO2 98%   BMI 22.52 kg/m      07/21/2024    2:02 PM 01/22/2024    4:54 PM 01/16/2024    1:42 PM  BP/Weight  Systolic BP 111  892  Diastolic BP 59  72  Wt. (Lbs) -- 185 --  BMI  22.52 kg/m2       Physical Exam Constitutional:      Appearance: He is well-developed.  Cardiovascular:     Rate and Rhythm: Normal rate.     Heart sounds: Normal heart sounds. No murmur heard. Pulmonary:     Effort: Pulmonary effort is normal.     Breath sounds: Normal breath sounds. No wheezing or rales.  Chest:     Chest wall: No tenderness.  Abdominal:     General: Bowel sounds are normal. There is no distension.     Palpations: Abdomen is soft. There is no mass.     Tenderness: There is no abdominal tenderness.  Musculoskeletal:     Right lower leg: No edema.     Left lower leg: No edema.     Comments: Restriction in range of motion of right upper and right lower extremities No tenderness on flexion and extension of right knee  Neurological:     Mental Status: He is alert.     Comments: More communicative today - the best I have ever seen him  Psychiatric:        Mood and Affect: Mood normal.        Latest Ref Rng & Units 01/16/2024    2:32 PM 03/27/2023    2:32 PM 10/03/2022    3:35 PM   CMP  Glucose 70 - 99 mg/dL 837  894  86   BUN 8 - 27 mg/dL 19  22  13    Creatinine 0.76 - 1.27 mg/dL 8.82  8.94  8.99   Sodium 134 - 144 mmol/L 141  140  139   Potassium 3.5 - 5.2 mmol/L 4.5  4.3  4.8   Chloride 96 - 106 mmol/L 100  100  99   CO2 20 - 29 mmol/L 24  30  25    Calcium  8.6 - 10.2 mg/dL 9.6  9.7  89.9   Total Protein 6.0 - 8.5 g/dL 7.0   7.1   Total Bilirubin 0.0 - 1.2 mg/dL 0.4   0.4   Alkaline Phos 44 - 121 IU/L 66   71   AST 0 - 40 IU/L 15   16   ALT 0 - 44 IU/L 12   14     Lipid Panel     Component Value Date/Time   CHOL 108 10/03/2022 1535   TRIG 52 10/03/2022 1535   HDL 49 10/03/2022 1535   CHOLHDL 2.2 10/03/2022 1535   CHOLHDL 3.5 03/11/2019 0946   VLDL 13 03/11/2019 0946   LDLCALC 47 10/03/2022 1535    CBC    Component Value Date/Time   WBC 5.3 03/28/2022 1628   WBC 5.3 02/17/2021 0256   RBC 3.95 (L) 03/28/2022 1628   RBC 4.03 (L) 02/17/2021 0256   HGB 12.1 (L) 03/28/2022 1628   HCT 36.3 (L) 03/28/2022 1628   PLT 219 03/28/2022 1628   MCV 92 03/28/2022 1628   MCH 30.6 03/28/2022 1628   MCH 30.5 02/17/2021 0256   MCHC 33.3  03/28/2022 1628   MCHC 32.8 02/17/2021 0256   RDW 12.0 03/28/2022 1628   LYMPHSABS 2.6 03/28/2022 1628   MONOABS 0.5 02/15/2021 1442   EOSABS 0.2 03/28/2022 1628   BASOSABS 0.0 03/28/2022 1628    Lab Results  Component Value Date   HGBA1C 6.0 07/21/2024   Lab Results  Component Value Date   HGBA1C 6.0 07/21/2024   HGBA1C 6.4 01/16/2024   HGBA1C 7.2 (A) 07/03/2023        Assessment & Plan Essential Hypertension Blood pressure low, requiring antihypertensive adjustment to prevent dizziness and falls. -He is currently asymptomatic - Decreased losartan  dose to 25 mg daily. - Continue amlodipine , chlorthalidone   Type 2 Diabetes Mellitus A1c well-controlled at 6.0, improved from 6.4. - Continue metformin  1000 mg oral bid.  Hyperlipidemia Controlled Cholesterol levels not checked since last year. -  Scheduled fasting blood test for cholesterol on November 18th, 2025. - Continue statin  Right knee and leg pain secondary to prior stroke Mild pain likely due to residual weakness from prior stroke with associated disuse, no numbness or significant pain. - Apply Voltaren gel to affected area. - Referred to physical therapy.  Cognitive impairment secondary to prior stroke Continues on donepezil  for memory issues post-stroke. - Continue donepezil  10 mg oral bedtime.  Insomnia Requires trazodone  and quetiapine  for sleep due to difficulty sleeping and waking early. - Continue trazodone  50 mg oral bedtime. - Continue quetiapine  100 mg oral bedtime.  History of CVA with residual deficit With residual right hemiparesis Secondary risk factor modification Continue statin - Blood pressure and glycemic control are imperative  General Health Maintenance Received flu shot at [facility]. - Provided walker for mobility support.      Meds ordered this encounter  Medications   losartan  (COZAAR ) 25 MG tablet    Sig: Take 1 tablet (25 mg total) by mouth daily.    Dispense:  90 tablet    Refill:  1    Dose decrease   diclofenac Sodium (VOLTAREN) 1 % GEL    Sig: Apply 4 g topically 4 (four) times daily.    Dispense:  100 g    Refill:  1   amLODipine  (NORVASC ) 10 MG tablet    Sig: Take 1 tablet (10 mg total) by mouth daily.    Dispense:  90 tablet    Refill:  1   atorvastatin  (LIPITOR) 40 MG tablet    Sig: TAKE 1 TABLET BY MOUTH IN THE EVENING TO LOWER CHOLESTEROL.    Dispense:  90 tablet    Refill:  1   chlorthalidone  (HYGROTON ) 25 MG tablet    Sig: Take 1 tablet (25 mg total) by mouth daily.    Dispense:  90 tablet    Refill:  1   donepezil  (ARICEPT ) 10 MG tablet    Sig: Take 1 tablet (10 mg total) by mouth at bedtime.    Dispense:  90 tablet    Refill:  1   metFORMIN  (GLUCOPHAGE ) 500 MG tablet    Sig: Take 2 tablets (1,000 mg total) by mouth 2 (two) times daily with a meal.     Dispense:  360 tablet    Refill:  1   QUEtiapine  (SEROQUEL ) 100 MG tablet    Sig: Take 1 tablet (100 mg total) by mouth at bedtime.    Dispense:  90 tablet    Refill:  1   traZODone  (DESYREL ) 50 MG tablet    Sig: Take 1 tablet (50 mg total) by mouth at  bedtime as needed for sleep.    Dispense:  90 tablet    Refill:  1   Misc. Devices MISC    Sig: Rolling walker with seat. Diagnosis - CVA    Dispense:  1 each    Refill:  0    Follow-up: Return in about 6 months (around 01/18/2025) for Chronic medical conditions.       Corrina Sabin, MD, FAAFP. [facility] [place], [place] [phone]   07/21/2024, 6:17 PM

## 2024-07-22 ENCOUNTER — Ambulatory Visit: Attending: Family Medicine

## 2024-07-22 DIAGNOSIS — E1159 Type 2 diabetes mellitus with other circulatory complications: Secondary | ICD-10-CM

## 2024-07-22 DIAGNOSIS — E785 Hyperlipidemia, unspecified: Secondary | ICD-10-CM

## 2024-07-23 ENCOUNTER — Encounter (HOSPITAL_COMMUNITY): Payer: Self-pay | Admitting: *Deleted

## 2024-07-23 ENCOUNTER — Ambulatory Visit (HOSPITAL_COMMUNITY)
Admission: EM | Admit: 2024-07-23 | Discharge: 2024-07-23 | Disposition: A | Discharge status: Home / Self Care | Attending: Family Medicine | Admitting: Family Medicine

## 2024-07-23 ENCOUNTER — Other Ambulatory Visit: Payer: Self-pay

## 2024-07-23 DIAGNOSIS — M79662 Pain in left lower leg: Secondary | ICD-10-CM | POA: Diagnosis not present

## 2024-07-23 DIAGNOSIS — S81802A Unspecified open wound, left lower leg, initial encounter: Secondary | ICD-10-CM | POA: Diagnosis not present

## 2024-07-23 MED ORDER — MUPIROCIN 2 % EX OINT: 1.0000 | TOPICAL_OINTMENT | Freq: Two times a day (BID) | CUTANEOUS | 0 refills | Status: AC

## 2024-07-23 NOTE — ED Provider Notes (Signed)
 [facility]   [id number] 07/23/24 Arrival Time: 1144  ASSESSMENT & PLAN:  1. Pain in left lower leg   2. Wound of left lower extremity, initial encounter    No delayed repair needed. Wound care discussed. Meds ordered this encounter  Medications   mupirocin ointment (BACTROBAN) 2 %    Sig: Apply 1 Application topically 2 (two) times daily. With dressing changes.    Dispense:  22 g    Refill:  0     Follow-up Information     [facility].   Specialty: Urgent Care Why: As needed. Contact information: [address] [place]  [address] [phone]        Delbert Clam, MD.   Specialty: Family Medicine Why: As needed. Contact information: [address] [place] [address] [place] [address] [phone]                 Reviewed expectations re: course of current medical issues. Questions answered. Outlined signs and symptoms indicating need for more acute intervention. Understanding verbalized. After Visit Summary given.   SUBJECTIVE: History from: Caregiver. Joe KATHEE Stabile Sr. is a 73 y.o. male. PT has a wound on Lt lower leg. Pt wife was helping cut off Pt's brief last nigh tand dropped the scissors on Pt leg. PT has a small open wound on Lt lower leg. No current bleeding.   OBJECTIVE:  Vitals:   07/23/24 1407  BP: 134/73  Pulse: 80  Resp: 20  Temp: 98.5 F (36.9 C)  SpO2: 94%    General appearance: alert; no distress Extremities: LLE with two approx 3-4 mm superficial cuts just below knee; no bleeding; no FB appreciated Skin: warm and dry Psychological: alert and cooperative; normal mood and affect  Labs: Labs Reviewed - No data to display  Imaging: No results found.  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) 06/03/2019    Cerebrovascular accident (CVA) due to occlusion of vertebral artery ([name])    Diabetes mellitus without complication ([name])    Dysphagia    post stroke   Hypertension    Left upper lobe pneumonia 02/15/2021   Oxygen deficiency    Stroke [facility])    Social History   Socioeconomic History   Marital status: Married    Spouse name: [name]   Number of children: Not on file   Years of education: Not on file   Highest education level: Not on file  Occupational History   Not on file  Tobacco Use   Smoking status: Never   Smokeless tobacco: Never  Vaping Use   Vaping status: Never Used  Substance and Sexual Activity   Alcohol use: No   Drug use: No   Sexual activity: Not Currently  Other Topics Concern   Not on file  Social History Narrative   12/16/19 Lives with wife   Social Drivers of Health   Financial Resource Strain: High Risk (01/22/2024)   Overall Financial Resource Strain (CARDIA)    Difficulty of Paying Living Expenses: Very hard  Food Insecurity: No Food Insecurity (01/22/2024)   Hunger Vital Sign    Worried About Running Out of Food in the Last Year: Never true    Ran Out of Food in the Last Year: Never true  Transportation Needs: No Transportation Needs (01/22/2024)   PRAPARE - Transportation  Lack of Transportation (Medical): No    Lack of Transportation (Non-Medical): No  Physical Activity: Sufficiently Active (01/22/2024)   Exercise Vital Sign    Days of Exercise per Week: 4 days    Minutes of Exercise per Session: 40 min  Stress: No Stress Concern Present (01/22/2024)   [employer] of Occupational Health - Occupational Stress Questionnaire    Feeling of Stress : Not at all  Social Connections: Moderately Integrated (01/22/2024)   Social Connection and Isolation Panel    Frequency of Communication with Friends and Family: Once a week    Frequency of Social Gatherings with Friends and Family: Once a week    Attends Religious Services: 1 to 4 times per year     Active Member of [employer] or Organizations: Yes    Attends Banker Meetings: 1 to 4 times per year    Marital Status: Married  Catering Manager Violence: Patient Unable To Answer (01/22/2024)   Humiliation, Afraid, Rape, and Kick questionnaire    Fear of Current or Ex-Partner: Patient unable to answer    Emotionally Abused: Patient unable to answer    Physically Abused: Patient unable to answer    Sexually Abused: Patient unable to answer   Family History  Problem Relation Age of Onset   Diabetes Other    Hypertension Other    Healthy Mother    Healthy Father    Past Surgical History:  Procedure Laterality Date   IR ANGIO INTRA EXTRACRAN SEL COM CAROTID INNOMINATE UNI L MOD SED  03/10/2019   IR ANGIO VERTEBRAL SEL SUBCLAVIAN INNOMINATE BILAT MOD SED  03/10/2019   IR CT HEAD LTD  03/10/2019   IR PERCUTANEOUS ART THROMBECTOMY/INFUSION INTRACRANIAL INC DIAG ANGIO  03/10/2019   RADIOLOGY WITH ANESTHESIA N/A 03/10/2019   Procedure: IR WITH ANESTHESIA/CODE STROKE;  Surgeon: Radiologist, Medication, MD;  Location: [facility] OR;  Service: Radiology;  Laterality: N/AMERL Rolinda Rogue, MD 07/23/24 1453

## 2024-07-23 NOTE — ED Triage Notes (Signed)
 PT has a wound on Lt lower leg. Pt wife was helping cut off Pt's brief last nigh tand dropped the scissors on Pt leg. PT has a small open wound on Lt lower leg.

## 2024-07-25 LAB — CMP14+EGFR
ALT: 13 IU/L (ref 0–44)
AST: 15 IU/L (ref 0–40)
Albumin: 4.7 g/dL (ref 3.9–4.9)
Alkaline Phosphatase: 64 IU/L (ref 47–123)
BUN/Creatinine Ratio: 27 — ABNORMAL HIGH (ref 10–24)
BUN: 23 mg/dL (ref 8–27)
Bilirubin Total: 0.6 mg/dL (ref 0.0–1.2)
CO2: 23 mmol/L (ref 20–29)
Calcium: 10 mg/dL (ref 8.6–10.2)
Chloride: 101 mmol/L (ref 96–106)
Creatinine, Ser: 0.86 mg/dL (ref 0.76–1.27)
Globulin, Total: 3 g/dL (ref 1.5–4.5)
Glucose: 116 mg/dL — ABNORMAL HIGH (ref 70–99)
Potassium: 4.5 mmol/L (ref 3.5–5.2)
Sodium: 143 mmol/L (ref 134–144)
Total Protein: 7.7 g/dL (ref 6.0–8.5)
eGFR: 93 mL/min/1.73 (ref >59)

## 2024-07-25 LAB — MICROALBUMIN / CREATININE URINE RATIO
Creatinine, Urine: 43.8 mg/dL
Microalb/Creat Ratio: 14 mg/g{creat} (ref 0–29)
Microalbumin, Urine: 6.2 ug/mL

## 2024-07-25 LAB — LP+NON-HDL CHOLESTEROL
Cholesterol, Total: 117 mg/dL (ref 100–199)
HDL: 58 mg/dL (ref >39)
LDL Chol Calc (NIH): 48 mg/dL (ref 0–99)
Total Non-HDL-Chol (LDL+VLDL): 59 mg/dL (ref 0–129)
Triglycerides: 45 mg/dL (ref 0–149)
VLDL Cholesterol Cal: 11 mg/dL (ref 5–40)

## 2024-08-05 ENCOUNTER — Other Ambulatory Visit: Payer: Self-pay

## 2024-08-05 ENCOUNTER — Telehealth: Payer: Self-pay | Admitting: Family Medicine

## 2024-08-05 DIAGNOSIS — I6930 Unspecified sequelae of cerebral infarction: Secondary | ICD-10-CM

## 2024-08-05 MED ORDER — MISC. DEVICES MISC: 0 refills | Status: AC

## 2024-08-05 NOTE — Telephone Encounter (Signed)
 New order will be faxed over.

## 2024-08-05 NOTE — Telephone Encounter (Signed)
 Copied from CRM #[id number]. Topic: Clinical - Order For Equipment >> Aug 05, 2024  9:16 AM Jeoffrey H wrote:  Reason for CRM: Rankin from [facility] stated the family wanted the patient to have a regular standing walker instead they received the walker with a seat. Patient is having difficulty with the walker with the seat due to his height. Family is requesting just the regular walker.   [phone]

## 2024-08-18 ENCOUNTER — Other Ambulatory Visit: Payer: Self-pay | Admitting: Family Medicine

## 2024-08-21 ENCOUNTER — Other Ambulatory Visit: Payer: Self-pay | Admitting: Family Medicine

## 2024-08-21 DIAGNOSIS — R058 Other specified cough: Secondary | ICD-10-CM

## 2024-09-19 ENCOUNTER — Other Ambulatory Visit: Payer: Self-pay | Admitting: Family Medicine

## 2024-09-19 DIAGNOSIS — I69319 Unspecified symptoms and signs involving cognitive functions following cerebral infarction: Secondary | ICD-10-CM

## 2025-01-27 ENCOUNTER — Ambulatory Visit
